# Patient Record
Sex: Female | Born: 1935 | Race: Black or African American | Hispanic: No | Marital: Married | Smoking: Never smoker
Health system: Southern US, Community
[De-identification: ages and names within clinical notes are randomized; demographics above are authoritative.]

## PROBLEM LIST (undated history)

## (undated) DIAGNOSIS — K219 Gastro-esophageal reflux disease without esophagitis: Secondary | ICD-10-CM

## (undated) DIAGNOSIS — T4145XA Adverse effect of unspecified anesthetic, initial encounter: Secondary | ICD-10-CM

## (undated) DIAGNOSIS — E039 Hypothyroidism, unspecified: Secondary | ICD-10-CM

## (undated) DIAGNOSIS — I10 Essential (primary) hypertension: Secondary | ICD-10-CM

## (undated) DIAGNOSIS — I509 Heart failure, unspecified: Secondary | ICD-10-CM

## (undated) DIAGNOSIS — R55 Syncope and collapse: Secondary | ICD-10-CM

## (undated) DIAGNOSIS — Z95810 Presence of automatic (implantable) cardiac defibrillator: Secondary | ICD-10-CM

## (undated) DIAGNOSIS — C801 Malignant (primary) neoplasm, unspecified: Secondary | ICD-10-CM

## (undated) DIAGNOSIS — IMO0001 Reserved for inherently not codable concepts without codable children: Secondary | ICD-10-CM

## (undated) DIAGNOSIS — H269 Unspecified cataract: Secondary | ICD-10-CM

## (undated) DIAGNOSIS — E785 Hyperlipidemia, unspecified: Secondary | ICD-10-CM

## (undated) DIAGNOSIS — R51 Headache: Secondary | ICD-10-CM

## (undated) DIAGNOSIS — R42 Dizziness and giddiness: Secondary | ICD-10-CM

## (undated) DIAGNOSIS — I447 Left bundle-branch block, unspecified: Secondary | ICD-10-CM

## (undated) DIAGNOSIS — R413 Other amnesia: Secondary | ICD-10-CM

## (undated) DIAGNOSIS — M1990 Unspecified osteoarthritis, unspecified site: Secondary | ICD-10-CM

## (undated) DIAGNOSIS — H9190 Unspecified hearing loss, unspecified ear: Secondary | ICD-10-CM

## (undated) DIAGNOSIS — F329 Major depressive disorder, single episode, unspecified: Secondary | ICD-10-CM

## (undated) DIAGNOSIS — I5020 Unspecified systolic (congestive) heart failure: Secondary | ICD-10-CM

## (undated) DIAGNOSIS — Z0389 Encounter for observation for other suspected diseases and conditions ruled out: Secondary | ICD-10-CM

## (undated) DIAGNOSIS — Z950 Presence of cardiac pacemaker: Secondary | ICD-10-CM

## (undated) DIAGNOSIS — T8859XA Other complications of anesthesia, initial encounter: Secondary | ICD-10-CM

## (undated) DIAGNOSIS — F32A Depression, unspecified: Secondary | ICD-10-CM

## (undated) DIAGNOSIS — I428 Other cardiomyopathies: Secondary | ICD-10-CM

## (undated) DIAGNOSIS — Z973 Presence of spectacles and contact lenses: Secondary | ICD-10-CM

## (undated) HISTORY — DX: Unspecified osteoarthritis, unspecified site: M19.90

## (undated) HISTORY — DX: Gastro-esophageal reflux disease without esophagitis: K21.9

## (undated) HISTORY — DX: Presence of automatic (implantable) cardiac defibrillator: Z95.810

## (undated) HISTORY — DX: Dizziness and giddiness: R42

## (undated) HISTORY — DX: Reserved for inherently not codable concepts without codable children: IMO0001

## (undated) HISTORY — PX: BACK SURGERY: SHX140

## (undated) HISTORY — PX: CATARACT EXTRACTION: SUR2

## (undated) HISTORY — DX: Other cardiomyopathies: I42.8

## (undated) HISTORY — DX: Heart failure, unspecified: I50.9

## (undated) HISTORY — DX: Depression, unspecified: F32.A

## (undated) HISTORY — DX: Encounter for observation for other suspected diseases and conditions ruled out: Z03.89

## (undated) HISTORY — PX: COLONOSCOPY: SHX174

## (undated) HISTORY — DX: Unspecified hearing loss, unspecified ear: H91.90

## (undated) HISTORY — DX: Unspecified systolic (congestive) heart failure: I50.20

## (undated) HISTORY — PX: EYE SURGERY: SHX253

## (undated) HISTORY — DX: Syncope and collapse: R55

## (undated) HISTORY — DX: Essential (primary) hypertension: I10

## (undated) HISTORY — DX: Hyperlipidemia, unspecified: E78.5

## (undated) HISTORY — PX: CARDIAC CATHETERIZATION: SHX172

## (undated) HISTORY — DX: Presence of spectacles and contact lenses: Z97.3

## (undated) HISTORY — DX: Headache: R51

## (undated) HISTORY — DX: Major depressive disorder, single episode, unspecified: F32.9

## (undated) HISTORY — DX: Presence of cardiac pacemaker: Z95.0

## (undated) HISTORY — DX: Malignant (primary) neoplasm, unspecified: C80.1

## (undated) HISTORY — DX: Left bundle-branch block, unspecified: I44.7

## (undated) HISTORY — DX: Other amnesia: R41.3

---
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
## 1998-01-15 ENCOUNTER — Ambulatory Visit (HOSPITAL_COMMUNITY): Admission: RE | Admit: 1998-01-15 | Discharge: 1998-01-15 | Payer: Self-pay | Admitting: Neurology

## 1998-04-09 HISTORY — PX: BREAST SURGERY: SHX581

## 1999-04-18 ENCOUNTER — Encounter (HOSPITAL_BASED_OUTPATIENT_CLINIC_OR_DEPARTMENT_OTHER): Payer: Self-pay | Admitting: General Surgery

## 1999-04-20 ENCOUNTER — Encounter (INDEPENDENT_AMBULATORY_CARE_PROVIDER_SITE_OTHER): Payer: Self-pay

## 1999-04-20 ENCOUNTER — Ambulatory Visit (HOSPITAL_COMMUNITY): Admission: RE | Admit: 1999-04-20 | Discharge: 1999-04-20 | Payer: Self-pay | Admitting: General Surgery

## 1999-06-02 ENCOUNTER — Encounter: Admission: RE | Admit: 1999-06-02 | Discharge: 1999-06-02 | Payer: Self-pay | Admitting: *Deleted

## 1999-07-05 ENCOUNTER — Other Ambulatory Visit: Admission: RE | Admit: 1999-07-05 | Discharge: 1999-07-05 | Payer: Self-pay | Admitting: *Deleted

## 1999-08-29 ENCOUNTER — Ambulatory Visit (HOSPITAL_COMMUNITY): Admission: RE | Admit: 1999-08-29 | Discharge: 1999-08-29 | Payer: Self-pay | Admitting: Internal Medicine

## 2000-01-13 ENCOUNTER — Emergency Department (HOSPITAL_COMMUNITY): Admission: EM | Admit: 2000-01-13 | Discharge: 2000-01-13 | Payer: Self-pay | Admitting: Emergency Medicine

## 2000-01-13 ENCOUNTER — Encounter: Payer: Self-pay | Admitting: Emergency Medicine

## 2000-01-19 ENCOUNTER — Other Ambulatory Visit: Admission: RE | Admit: 2000-01-19 | Discharge: 2000-01-19 | Payer: Self-pay | Admitting: Obstetrics and Gynecology

## 2000-01-19 ENCOUNTER — Encounter (INDEPENDENT_AMBULATORY_CARE_PROVIDER_SITE_OTHER): Payer: Self-pay

## 2000-06-25 ENCOUNTER — Other Ambulatory Visit: Admission: RE | Admit: 2000-06-25 | Discharge: 2000-06-25 | Payer: Self-pay | Admitting: *Deleted

## 2000-09-12 ENCOUNTER — Ambulatory Visit (HOSPITAL_COMMUNITY): Admission: RE | Admit: 2000-09-12 | Discharge: 2000-09-12 | Payer: Self-pay | Admitting: *Deleted

## 2001-05-17 HISTORY — PX: TOTAL KNEE ARTHROPLASTY: SHX125

## 2001-06-14 HISTORY — PX: JOINT REPLACEMENT: SHX530

## 2002-06-11 ENCOUNTER — Encounter: Payer: Self-pay | Admitting: Orthopedic Surgery

## 2002-06-15 ENCOUNTER — Inpatient Hospital Stay (HOSPITAL_COMMUNITY): Admission: RE | Admit: 2002-06-15 | Discharge: 2002-06-19 | Payer: Self-pay | Admitting: Orthopedic Surgery

## 2003-05-20 ENCOUNTER — Encounter: Admission: RE | Admit: 2003-05-20 | Discharge: 2003-05-20 | Payer: Self-pay | Admitting: Gastroenterology

## 2004-04-09 HISTORY — PX: LUMBAR FUSION: SHX111

## 2004-08-10 ENCOUNTER — Encounter: Admission: RE | Admit: 2004-08-10 | Discharge: 2004-08-10 | Payer: Self-pay | Admitting: Orthopedic Surgery

## 2005-03-27 ENCOUNTER — Inpatient Hospital Stay (HOSPITAL_COMMUNITY): Admission: RE | Admit: 2005-03-27 | Discharge: 2005-04-06 | Payer: Self-pay | Admitting: Neurosurgery

## 2005-03-27 ENCOUNTER — Ambulatory Visit: Payer: Self-pay | Admitting: Cardiology

## 2005-03-27 ENCOUNTER — Ambulatory Visit: Payer: Self-pay | Admitting: Physical Medicine & Rehabilitation

## 2005-03-29 ENCOUNTER — Ambulatory Visit: Payer: Self-pay | Admitting: Pulmonary Disease

## 2005-03-29 ENCOUNTER — Encounter (INDEPENDENT_AMBULATORY_CARE_PROVIDER_SITE_OTHER): Payer: Self-pay | Admitting: *Deleted

## 2005-04-06 ENCOUNTER — Inpatient Hospital Stay (HOSPITAL_COMMUNITY)
Admission: RE | Admit: 2005-04-06 | Discharge: 2005-04-17 | Payer: Self-pay | Admitting: Physical Medicine & Rehabilitation

## 2005-08-15 ENCOUNTER — Inpatient Hospital Stay (HOSPITAL_COMMUNITY): Admission: EM | Admit: 2005-08-15 | Discharge: 2005-08-21 | Payer: Self-pay | Admitting: *Deleted

## 2006-04-09 HISTORY — PX: BREAST LUMPECTOMY: SHX2

## 2006-04-09 HISTORY — PX: MASTECTOMY, PARTIAL: SHX709

## 2006-04-11 ENCOUNTER — Encounter: Payer: Self-pay | Admitting: Internal Medicine

## 2006-04-21 ENCOUNTER — Inpatient Hospital Stay (HOSPITAL_COMMUNITY): Admission: EM | Admit: 2006-04-21 | Discharge: 2006-04-26 | Payer: Self-pay | Admitting: Emergency Medicine

## 2006-04-21 DIAGNOSIS — R55 Syncope and collapse: Secondary | ICD-10-CM

## 2006-04-21 HISTORY — DX: Syncope and collapse: R55

## 2006-04-22 ENCOUNTER — Encounter: Payer: Self-pay | Admitting: Vascular Surgery

## 2006-04-23 HISTORY — PX: PACEMAKER INSERTION: SHX728

## 2006-11-01 ENCOUNTER — Emergency Department (HOSPITAL_COMMUNITY): Admission: EM | Admit: 2006-11-01 | Discharge: 2006-11-01 | Payer: Self-pay | Admitting: Family Medicine

## 2007-02-11 ENCOUNTER — Ambulatory Visit (HOSPITAL_COMMUNITY): Admission: RE | Admit: 2007-02-11 | Discharge: 2007-02-11 | Payer: Self-pay | Admitting: General Surgery

## 2007-02-11 ENCOUNTER — Encounter (HOSPITAL_BASED_OUTPATIENT_CLINIC_OR_DEPARTMENT_OTHER): Payer: Self-pay | Admitting: General Surgery

## 2007-02-21 ENCOUNTER — Ambulatory Visit: Payer: Self-pay | Admitting: Oncology

## 2007-03-05 LAB — COMPREHENSIVE METABOLIC PANEL
ALT: 24 U/L (ref 0–35)
AST: 24 U/L (ref 0–37)
Albumin: 4.2 g/dL (ref 3.5–5.2)
Alkaline Phosphatase: 55 U/L (ref 39–117)
BUN: 16 mg/dL (ref 6–23)
CO2: 23 mEq/L (ref 19–32)
Calcium: 10.1 mg/dL (ref 8.4–10.5)
Chloride: 103 mEq/L (ref 96–112)
Creatinine, Ser: 0.89 mg/dL (ref 0.40–1.20)
Glucose, Bld: 99 mg/dL (ref 70–99)
Potassium: 3.9 mEq/L (ref 3.5–5.3)
Sodium: 138 mEq/L (ref 135–145)
Total Bilirubin: 0.3 mg/dL (ref 0.3–1.2)
Total Protein: 6.9 g/dL (ref 6.0–8.3)

## 2007-03-05 LAB — CBC WITH DIFFERENTIAL/PLATELET
BASO%: 0.5 % (ref 0.0–2.0)
Basophils Absolute: 0 10*3/uL (ref 0.0–0.1)
EOS%: 4.7 % (ref 0.0–7.0)
Eosinophils Absolute: 0.4 10*3/uL (ref 0.0–0.5)
HCT: 37 % (ref 34.8–46.6)
HGB: 12.6 g/dL (ref 11.6–15.9)
LYMPH%: 29 % (ref 14.0–48.0)
MCH: 30.2 pg (ref 26.0–34.0)
MCHC: 34.1 g/dL (ref 32.0–36.0)
MCV: 88.5 fL (ref 81.0–101.0)
MONO#: 0.7 10*3/uL (ref 0.1–0.9)
MONO%: 8 % (ref 0.0–13.0)
NEUT#: 5 10*3/uL (ref 1.5–6.5)
NEUT%: 57.8 % (ref 39.6–76.8)
Platelets: 226 10*3/uL (ref 145–400)
RBC: 4.18 10*6/uL (ref 3.70–5.32)
RDW: 14 % (ref 11.3–14.5)
WBC: 8.6 10*3/uL (ref 3.9–10.0)
lymph#: 2.5 10*3/uL (ref 0.9–3.3)

## 2007-03-05 LAB — LACTATE DEHYDROGENASE: LDH: 214 U/L (ref 94–250)

## 2007-03-05 LAB — CANCER ANTIGEN 27.29: CA 27.29: 14 U/mL (ref 0–39)

## 2007-03-12 ENCOUNTER — Ambulatory Visit: Admission: RE | Admit: 2007-03-12 | Discharge: 2007-04-09 | Payer: Self-pay | Admitting: Radiation Oncology

## 2007-03-12 LAB — VITAMIN D PNL(25-HYDRXY+1,25-DIHY)-BLD
Vit D, 1,25-Dihydroxy: 63 pg/mL — ABNORMAL HIGH (ref 6–62)
Vit D, 25-Hydroxy: 37 ng/mL (ref 30–89)

## 2007-03-18 ENCOUNTER — Ambulatory Visit (HOSPITAL_COMMUNITY): Admission: RE | Admit: 2007-03-18 | Discharge: 2007-03-18 | Payer: Self-pay | Admitting: Oncology

## 2007-04-10 ENCOUNTER — Ambulatory Visit: Admission: RE | Admit: 2007-04-10 | Discharge: 2007-05-27 | Payer: Self-pay | Admitting: Radiation Oncology

## 2007-06-03 ENCOUNTER — Ambulatory Visit: Payer: Self-pay | Admitting: Oncology

## 2007-06-05 LAB — CBC WITH DIFFERENTIAL/PLATELET
BASO%: 0.8 % (ref 0.0–2.0)
Basophils Absolute: 0.1 10*3/uL (ref 0.0–0.1)
EOS%: 2.7 % (ref 0.0–7.0)
Eosinophils Absolute: 0.2 10*3/uL (ref 0.0–0.5)
HCT: 38.8 % (ref 34.8–46.6)
HGB: 13.3 g/dL (ref 11.6–15.9)
LYMPH%: 23.3 % (ref 14.0–48.0)
MCH: 29.9 pg (ref 26.0–34.0)
MCHC: 34.1 g/dL (ref 32.0–36.0)
MCV: 87.8 fL (ref 81.0–101.0)
MONO#: 0.9 10*3/uL (ref 0.1–0.9)
MONO%: 12.9 % (ref 0.0–13.0)
NEUT#: 4.2 10*3/uL (ref 1.5–6.5)
NEUT%: 60.3 % (ref 39.6–76.8)
Platelets: 208 10*3/uL (ref 145–400)
RBC: 4.43 10*6/uL (ref 3.70–5.32)
RDW: 14.4 % (ref 11.3–14.5)
WBC: 7 10*3/uL (ref 3.9–10.0)
lymph#: 1.6 10*3/uL (ref 0.9–3.3)

## 2007-06-05 LAB — COMPREHENSIVE METABOLIC PANEL
ALT: 24 U/L (ref 0–35)
AST: 26 U/L (ref 0–37)
Albumin: 4.3 g/dL (ref 3.5–5.2)
Alkaline Phosphatase: 69 U/L (ref 39–117)
BUN: 17 mg/dL (ref 6–23)
CO2: 25 mEq/L (ref 19–32)
Calcium: 9.9 mg/dL (ref 8.4–10.5)
Chloride: 103 mEq/L (ref 96–112)
Creatinine, Ser: 0.94 mg/dL (ref 0.40–1.20)
Glucose, Bld: 90 mg/dL (ref 70–99)
Potassium: 4.2 mEq/L (ref 3.5–5.3)
Sodium: 140 mEq/L (ref 135–145)
Total Bilirubin: 0.4 mg/dL (ref 0.3–1.2)
Total Protein: 7.2 g/dL (ref 6.0–8.3)

## 2008-01-22 ENCOUNTER — Ambulatory Visit: Payer: Self-pay | Admitting: Oncology

## 2008-01-28 LAB — CBC WITH DIFFERENTIAL/PLATELET
BASO%: 0.5 % (ref 0.0–2.0)
Basophils Absolute: 0 10*3/uL (ref 0.0–0.1)
EOS%: 4.4 % (ref 0.0–7.0)
Eosinophils Absolute: 0.3 10*3/uL (ref 0.0–0.5)
HCT: 38 % (ref 34.8–46.6)
HGB: 12.9 g/dL (ref 11.6–15.9)
LYMPH%: 31.5 % (ref 14.0–48.0)
MCH: 29.7 pg (ref 26.0–34.0)
MCHC: 33.8 g/dL (ref 32.0–36.0)
MCV: 87.7 fL (ref 81.0–101.0)
MONO#: 0.8 10*3/uL (ref 0.1–0.9)
MONO%: 10.1 % (ref 0.0–13.0)
NEUT#: 4.1 10*3/uL (ref 1.5–6.5)
NEUT%: 53.5 % (ref 39.6–76.8)
Platelets: 205 10*3/uL (ref 145–400)
RBC: 4.33 10*6/uL (ref 3.70–5.32)
RDW: 13.9 % (ref 11.3–14.5)
WBC: 7.7 10*3/uL (ref 3.9–10.0)
lymph#: 2.4 10*3/uL (ref 0.9–3.3)

## 2008-01-29 LAB — COMPREHENSIVE METABOLIC PANEL
ALT: 23 U/L (ref 0–35)
AST: 25 U/L (ref 0–37)
Albumin: 4.4 g/dL (ref 3.5–5.2)
Alkaline Phosphatase: 72 U/L (ref 39–117)
BUN: 18 mg/dL (ref 6–23)
CO2: 26 mEq/L (ref 19–32)
Calcium: 10.3 mg/dL (ref 8.4–10.5)
Chloride: 99 mEq/L (ref 96–112)
Creatinine, Ser: 0.9 mg/dL (ref 0.40–1.20)
Glucose, Bld: 96 mg/dL (ref 70–99)
Potassium: 4 mEq/L (ref 3.5–5.3)
Sodium: 135 mEq/L (ref 135–145)
Total Bilirubin: 0.3 mg/dL (ref 0.3–1.2)
Total Protein: 7.5 g/dL (ref 6.0–8.3)

## 2008-01-29 LAB — CANCER ANTIGEN 27.29: CA 27.29: 25 U/mL (ref 0–39)

## 2008-01-29 LAB — LACTATE DEHYDROGENASE: LDH: 198 U/L (ref 94–250)

## 2008-01-29 LAB — VITAMIN D 25 HYDROXY (VIT D DEFICIENCY, FRACTURES): Vit D, 25-Hydroxy: 37 ng/mL (ref 30–89)

## 2008-07-28 ENCOUNTER — Ambulatory Visit: Payer: Self-pay | Admitting: Oncology

## 2008-08-03 LAB — CBC WITH DIFFERENTIAL/PLATELET
BASO%: 0.4 % (ref 0.0–2.0)
Basophils Absolute: 0 10*3/uL (ref 0.0–0.1)
EOS%: 5.1 % (ref 0.0–7.0)
Eosinophils Absolute: 0.4 10*3/uL (ref 0.0–0.5)
HCT: 38.2 % (ref 34.8–46.6)
HGB: 12.7 g/dL (ref 11.6–15.9)
LYMPH%: 26.9 % (ref 14.0–49.7)
MCH: 29.4 pg (ref 25.1–34.0)
MCHC: 33.2 g/dL (ref 31.5–36.0)
MCV: 88.5 fL (ref 79.5–101.0)
MONO#: 0.7 10*3/uL (ref 0.1–0.9)
MONO%: 10 % (ref 0.0–14.0)
NEUT#: 4 10*3/uL (ref 1.5–6.5)
NEUT%: 57.6 % (ref 38.4–76.8)
Platelets: 195 10*3/uL (ref 145–400)
RBC: 4.32 10*6/uL (ref 3.70–5.45)
RDW: 14.4 % (ref 11.2–14.5)
WBC: 7 10*3/uL (ref 3.9–10.3)
lymph#: 1.9 10*3/uL (ref 0.9–3.3)

## 2008-08-04 LAB — COMPREHENSIVE METABOLIC PANEL
ALT: 28 U/L (ref 0–35)
AST: 27 U/L (ref 0–37)
Albumin: 4.3 g/dL (ref 3.5–5.2)
Alkaline Phosphatase: 63 U/L (ref 39–117)
BUN: 15 mg/dL (ref 6–23)
CO2: 26 mEq/L (ref 19–32)
Calcium: 10 mg/dL (ref 8.4–10.5)
Chloride: 103 mEq/L (ref 96–112)
Creatinine, Ser: 0.85 mg/dL (ref 0.40–1.20)
Glucose, Bld: 99 mg/dL (ref 70–99)
Potassium: 4.1 mEq/L (ref 3.5–5.3)
Sodium: 138 mEq/L (ref 135–145)
Total Bilirubin: 0.3 mg/dL (ref 0.3–1.2)
Total Protein: 7.4 g/dL (ref 6.0–8.3)

## 2008-08-04 LAB — CANCER ANTIGEN 27.29: CA 27.29: 24 U/mL (ref 0–39)

## 2008-08-04 LAB — LACTATE DEHYDROGENASE: LDH: 194 U/L (ref 94–250)

## 2008-08-04 LAB — VITAMIN D 25 HYDROXY (VIT D DEFICIENCY, FRACTURES): Vit D, 25-Hydroxy: 44 ng/mL (ref 30–89)

## 2009-02-08 ENCOUNTER — Ambulatory Visit: Payer: Self-pay | Admitting: Oncology

## 2009-02-10 LAB — CBC WITH DIFFERENTIAL/PLATELET
BASO%: 0.5 % (ref 0.0–2.0)
Basophils Absolute: 0 10*3/uL (ref 0.0–0.1)
EOS%: 3.5 % (ref 0.0–7.0)
Eosinophils Absolute: 0.3 10*3/uL (ref 0.0–0.5)
HCT: 37.8 % (ref 34.8–46.6)
HGB: 12.4 g/dL (ref 11.6–15.9)
LYMPH%: 34.9 % (ref 14.0–49.7)
MCH: 28.9 pg (ref 25.1–34.0)
MCHC: 32.8 g/dL (ref 31.5–36.0)
MCV: 88.1 fL (ref 79.5–101.0)
MONO#: 0.7 10*3/uL (ref 0.1–0.9)
MONO%: 8.5 % (ref 0.0–14.0)
NEUT#: 4.1 10*3/uL (ref 1.5–6.5)
NEUT%: 52.6 % (ref 38.4–76.8)
Platelets: 152 10*3/uL (ref 145–400)
RBC: 4.29 10*6/uL (ref 3.70–5.45)
RDW: 14.9 % — ABNORMAL HIGH (ref 11.2–14.5)
WBC: 7.8 10*3/uL (ref 3.9–10.3)
lymph#: 2.7 10*3/uL (ref 0.9–3.3)
nRBC: 0 % (ref 0–0)

## 2009-02-11 LAB — COMPREHENSIVE METABOLIC PANEL
ALT: 24 U/L (ref 0–35)
AST: 24 U/L (ref 0–37)
Albumin: 4.3 g/dL (ref 3.5–5.2)
Alkaline Phosphatase: 58 U/L (ref 39–117)
BUN: 19 mg/dL (ref 6–23)
CO2: 25 mEq/L (ref 19–32)
Calcium: 9.7 mg/dL (ref 8.4–10.5)
Chloride: 103 mEq/L (ref 96–112)
Creatinine, Ser: 0.84 mg/dL (ref 0.40–1.20)
Glucose, Bld: 89 mg/dL (ref 70–99)
Potassium: 3.9 mEq/L (ref 3.5–5.3)
Sodium: 139 mEq/L (ref 135–145)
Total Bilirubin: 0.2 mg/dL — ABNORMAL LOW (ref 0.3–1.2)
Total Protein: 7 g/dL (ref 6.0–8.3)

## 2009-02-11 LAB — LACTATE DEHYDROGENASE: LDH: 202 U/L (ref 94–250)

## 2009-02-11 LAB — CANCER ANTIGEN 27.29: CA 27.29: 23 U/mL (ref 0–39)

## 2009-02-11 LAB — VITAMIN D 25 HYDROXY (VIT D DEFICIENCY, FRACTURES): Vit D, 25-Hydroxy: 27 ng/mL — ABNORMAL LOW (ref 30–89)

## 2009-03-23 ENCOUNTER — Encounter: Payer: Self-pay | Admitting: Internal Medicine

## 2009-08-09 ENCOUNTER — Ambulatory Visit: Payer: Self-pay | Admitting: Oncology

## 2009-08-10 LAB — CBC WITH DIFFERENTIAL/PLATELET
BASO%: 0.2 % (ref 0.0–2.0)
Basophils Absolute: 0 10*3/uL (ref 0.0–0.1)
EOS%: 2.7 % (ref 0.0–7.0)
Eosinophils Absolute: 0.2 10*3/uL (ref 0.0–0.5)
HCT: 38.4 % (ref 34.8–46.6)
HGB: 12.7 g/dL (ref 11.6–15.9)
LYMPH%: 32.1 % (ref 14.0–49.7)
MCH: 28.7 pg (ref 25.1–34.0)
MCHC: 33.1 g/dL (ref 31.5–36.0)
MCV: 86.7 fL (ref 79.5–101.0)
MONO#: 0.6 10*3/uL (ref 0.1–0.9)
MONO%: 7 % (ref 0.0–14.0)
NEUT#: 5.2 10*3/uL (ref 1.5–6.5)
NEUT%: 58 % (ref 38.4–76.8)
Platelets: 151 10*3/uL (ref 145–400)
RBC: 4.43 10*6/uL (ref 3.70–5.45)
RDW: 15.7 % — ABNORMAL HIGH (ref 11.2–14.5)
WBC: 9 10*3/uL (ref 3.9–10.3)
lymph#: 2.9 10*3/uL (ref 0.9–3.3)
nRBC: 0 % (ref 0–0)

## 2009-08-11 LAB — COMPREHENSIVE METABOLIC PANEL
ALT: 36 U/L — ABNORMAL HIGH (ref 0–35)
AST: 28 U/L (ref 0–37)
Albumin: 4.4 g/dL (ref 3.5–5.2)
Alkaline Phosphatase: 53 U/L (ref 39–117)
BUN: 27 mg/dL — ABNORMAL HIGH (ref 6–23)
CO2: 23 mEq/L (ref 19–32)
Calcium: 9.8 mg/dL (ref 8.4–10.5)
Chloride: 105 mEq/L (ref 96–112)
Creatinine, Ser: 0.93 mg/dL (ref 0.40–1.20)
Glucose, Bld: 125 mg/dL — ABNORMAL HIGH (ref 70–99)
Potassium: 3.8 mEq/L (ref 3.5–5.3)
Sodium: 140 mEq/L (ref 135–145)
Total Bilirubin: 0.2 mg/dL — ABNORMAL LOW (ref 0.3–1.2)
Total Protein: 7.1 g/dL (ref 6.0–8.3)

## 2009-08-11 LAB — CANCER ANTIGEN 27.29: CA 27.29: 26 U/mL (ref 0–39)

## 2009-08-11 LAB — LACTATE DEHYDROGENASE: LDH: 214 U/L (ref 94–250)

## 2009-08-11 LAB — VITAMIN D 25 HYDROXY (VIT D DEFICIENCY, FRACTURES): Vit D, 25-Hydroxy: 41 ng/mL (ref 30–89)

## 2009-11-04 ENCOUNTER — Encounter: Admission: RE | Admit: 2009-11-04 | Discharge: 2009-11-04 | Payer: Self-pay | Admitting: Orthopedic Surgery

## 2010-01-01 ENCOUNTER — Inpatient Hospital Stay (HOSPITAL_COMMUNITY): Admission: EM | Admit: 2010-01-01 | Discharge: 2010-01-03 | Payer: Self-pay | Admitting: Emergency Medicine

## 2010-01-01 ENCOUNTER — Ambulatory Visit: Payer: Self-pay | Admitting: Internal Medicine

## 2010-01-03 ENCOUNTER — Ambulatory Visit: Payer: Self-pay | Admitting: Vascular Surgery

## 2010-01-03 ENCOUNTER — Encounter (INDEPENDENT_AMBULATORY_CARE_PROVIDER_SITE_OTHER): Payer: Self-pay | Admitting: Internal Medicine

## 2010-01-12 ENCOUNTER — Ambulatory Visit (HOSPITAL_COMMUNITY)
Admission: RE | Admit: 2010-01-12 | Discharge: 2010-01-12 | Payer: Self-pay | Source: Home / Self Care | Admitting: General Surgery

## 2010-02-14 ENCOUNTER — Ambulatory Visit: Payer: Self-pay | Admitting: Oncology

## 2010-02-15 LAB — CBC WITH DIFFERENTIAL/PLATELET
BASO%: 0.4 % (ref 0.0–2.0)
Basophils Absolute: 0 10*3/uL (ref 0.0–0.1)
EOS%: 4.7 % (ref 0.0–7.0)
Eosinophils Absolute: 0.4 10*3/uL (ref 0.0–0.5)
HCT: 39.2 % (ref 34.8–46.6)
HGB: 12.8 g/dL (ref 11.6–15.9)
LYMPH%: 38.1 % (ref 14.0–49.7)
MCH: 28.5 pg (ref 25.1–34.0)
MCHC: 32.7 g/dL (ref 31.5–36.0)
MCV: 87.3 fL (ref 79.5–101.0)
MONO#: 0.7 10*3/uL (ref 0.1–0.9)
MONO%: 9.3 % (ref 0.0–14.0)
NEUT#: 3.7 10*3/uL (ref 1.5–6.5)
NEUT%: 47.5 % (ref 38.4–76.8)
Platelets: 154 10*3/uL (ref 145–400)
RBC: 4.49 10*6/uL (ref 3.70–5.45)
RDW: 15.4 % — ABNORMAL HIGH (ref 11.2–14.5)
WBC: 7.7 10*3/uL (ref 3.9–10.3)
lymph#: 2.9 10*3/uL (ref 0.9–3.3)

## 2010-02-15 LAB — COMPREHENSIVE METABOLIC PANEL
ALT: 31 U/L (ref 0–35)
AST: 30 U/L (ref 0–37)
Albumin: 4.2 g/dL (ref 3.5–5.2)
Alkaline Phosphatase: 68 U/L (ref 39–117)
BUN: 25 mg/dL — ABNORMAL HIGH (ref 6–23)
CO2: 29 mEq/L (ref 19–32)
Calcium: 10.6 mg/dL — ABNORMAL HIGH (ref 8.4–10.5)
Chloride: 101 mEq/L (ref 96–112)
Creatinine, Ser: 1.13 mg/dL (ref 0.40–1.20)
Glucose, Bld: 99 mg/dL (ref 70–99)
Potassium: 4.5 mEq/L (ref 3.5–5.3)
Sodium: 139 mEq/L (ref 135–145)
Total Bilirubin: 0.7 mg/dL (ref 0.3–1.2)
Total Protein: 7.8 g/dL (ref 6.0–8.3)

## 2010-02-15 LAB — LACTATE DEHYDROGENASE: LDH: 186 U/L (ref 94–250)

## 2010-02-16 LAB — VITAMIN D 25 HYDROXY (VIT D DEFICIENCY, FRACTURES): Vit D, 25-Hydroxy: 54 ng/mL (ref 30–89)

## 2010-02-16 LAB — CANCER ANTIGEN 27.29: CA 27.29: 26 U/mL (ref 0–39)

## 2010-06-22 LAB — CK TOTAL AND CKMB (NOT AT ARMC)
CK, MB: 2.1 ng/mL (ref 0.3–4.0)
Relative Index: INVALID (ref 0.0–2.5)
Total CK: 88 U/L (ref 7–177)

## 2010-06-22 LAB — LIPID PANEL
Cholesterol: 101 mg/dL (ref 0–200)
Cholesterol: 210 mg/dL — ABNORMAL HIGH (ref 0–200)
HDL: 32 mg/dL — ABNORMAL LOW (ref >39)
HDL: 52 mg/dL (ref >39)
LDL Cholesterol: 135 mg/dL — ABNORMAL HIGH (ref 0–99)
LDL Cholesterol: 39 mg/dL (ref 0–99)
Total CHOL/HDL Ratio: 3.2 RATIO
Total CHOL/HDL Ratio: 4 RATIO
Triglycerides: 114 mg/dL (ref <150)
Triglycerides: 148 mg/dL (ref <150)
VLDL: 23 mg/dL (ref 0–40)
VLDL: 30 mg/dL (ref 0–40)

## 2010-06-22 LAB — URINE MICROSCOPIC-ADD ON

## 2010-06-22 LAB — POCT CARDIAC MARKERS
CKMB, poc: 1.1 ng/mL (ref 1.0–8.0)
Myoglobin, poc: 96.3 ng/mL (ref 12–200)
Troponin i, poc: <0.05 ng/mL (ref 0.00–0.09)

## 2010-06-22 LAB — CBC
HCT: 40.3 % (ref 36.0–46.0)
HCT: 38 % (ref 36.0–46.0)
HCT: 41.4 % (ref 36.0–46.0)
Hemoglobin: 13.3 g/dL (ref 12.0–15.0)
Hemoglobin: 12.6 g/dL (ref 12.0–15.0)
Hemoglobin: 13.8 g/dL (ref 12.0–15.0)
MCH: 28.8 pg (ref 26.0–34.0)
MCH: 29 pg (ref 26.0–34.0)
MCH: 29.3 pg (ref 26.0–34.0)
MCHC: 33 g/dL (ref 30.0–36.0)
MCHC: 33.2 g/dL (ref 30.0–36.0)
MCHC: 33.3 g/dL (ref 30.0–36.0)
MCV: 87.2 fL (ref 78.0–100.0)
MCV: 87.4 fL (ref 78.0–100.0)
MCV: 87.9 fL (ref 78.0–100.0)
Platelets: 181 10*3/uL (ref 150–400)
Platelets: 128 10*3/uL — ABNORMAL LOW (ref 150–400)
Platelets: 128 10*3/uL — ABNORMAL LOW (ref 150–400)
RBC: 4.62 MIL/uL (ref 3.87–5.11)
RBC: 4.35 MIL/uL (ref 3.87–5.11)
RBC: 4.71 MIL/uL (ref 3.87–5.11)
RDW: 14.7 % (ref 11.5–15.5)
RDW: 14.9 % (ref 11.5–15.5)
RDW: 15 % (ref 11.5–15.5)
WBC: 9.6 10*3/uL (ref 4.0–10.5)
WBC: 7.9 10*3/uL (ref 4.0–10.5)
WBC: 7.9 10*3/uL (ref 4.0–10.5)

## 2010-06-22 LAB — CARDIAC PANEL(CRET KIN+CKTOT+MB+TROPI)
CK, MB: 2 ng/mL (ref 0.3–4.0)
CK, MB: 2.4 ng/mL (ref 0.3–4.0)
CK, MB: 3.2 ng/mL (ref 0.3–4.0)
Relative Index: INVALID (ref 0.0–2.5)
Relative Index: INVALID (ref 0.0–2.5)
Relative Index: INVALID (ref 0.0–2.5)
Total CK: 86 U/L (ref 7–177)
Total CK: 93 U/L (ref 7–177)
Total CK: 93 U/L (ref 7–177)
Troponin I: 0.02 ng/mL (ref 0.00–0.06)
Troponin I: 0.02 ng/mL (ref 0.00–0.06)
Troponin I: 0.05 ng/mL (ref 0.00–0.06)

## 2010-06-22 LAB — BASIC METABOLIC PANEL
BUN: 20 mg/dL (ref 6–23)
BUN: 15 mg/dL (ref 6–23)
CO2: 29 mEq/L (ref 19–32)
CO2: 30 mEq/L (ref 19–32)
Calcium: 10.4 mg/dL (ref 8.4–10.5)
Calcium: 10.4 mg/dL (ref 8.4–10.5)
Chloride: 103 mEq/L (ref 96–112)
Chloride: 104 mEq/L (ref 96–112)
Creatinine, Ser: 1.08 mg/dL (ref 0.4–1.2)
Creatinine, Ser: 0.81 mg/dL (ref 0.4–1.2)
GFR calc Af Amer: >60 mL/min (ref >60)
GFR calc Af Amer: >60 mL/min (ref >60)
GFR calc non Af Amer: 50 mL/min — ABNORMAL LOW (ref >60)
GFR calc non Af Amer: >60 mL/min (ref >60)
Glucose, Bld: 94 mg/dL (ref 70–99)
Glucose, Bld: 99 mg/dL (ref 70–99)
Potassium: 3.9 mEq/L (ref 3.5–5.1)
Potassium: 4.3 mEq/L (ref 3.5–5.1)
Sodium: 139 mEq/L (ref 135–145)
Sodium: 140 mEq/L (ref 135–145)

## 2010-06-22 LAB — POCT I-STAT, CHEM 8
BUN: 24 mg/dL — ABNORMAL HIGH (ref 6–23)
Calcium, Ion: 1.2 mmol/L (ref 1.12–1.32)
Chloride: 107 mEq/L (ref 96–112)
Creatinine, Ser: 1 mg/dL (ref 0.4–1.2)
Glucose, Bld: 89 mg/dL (ref 70–99)
HCT: 40 % (ref 36.0–46.0)
Hemoglobin: 13.6 g/dL (ref 12.0–15.0)
Potassium: 4.1 mEq/L (ref 3.5–5.1)
Sodium: 141 mEq/L (ref 135–145)
TCO2: 26 mmol/L (ref 0–100)

## 2010-06-22 LAB — TSH: TSH: 3.488 u[IU]/mL (ref 0.350–4.500)

## 2010-06-22 LAB — TROPONIN I: Troponin I: 0.02 ng/mL (ref 0.00–0.06)

## 2010-06-22 LAB — URINALYSIS, ROUTINE W REFLEX MICROSCOPIC
Bilirubin Urine: NEGATIVE
Glucose, UA: NEGATIVE mg/dL
Hgb urine dipstick: NEGATIVE
Ketones, ur: NEGATIVE mg/dL
Nitrite: NEGATIVE
Protein, ur: NEGATIVE mg/dL
Specific Gravity, Urine: 1.02 (ref 1.005–1.030)
Urobilinogen, UA: 0.2 mg/dL (ref 0.0–1.0)
pH: 7.5 (ref 5.0–8.0)

## 2010-06-22 LAB — DIFFERENTIAL
Basophils Absolute: 0 10*3/uL (ref 0.0–0.1)
Basophils Relative: 0 % (ref 0–1)
Eosinophils Absolute: 0.2 10*3/uL (ref 0.0–0.7)
Eosinophils Relative: 2 % (ref 0–5)
Lymphocytes Relative: 29 % (ref 12–46)
Lymphs Abs: 2.3 10*3/uL (ref 0.7–4.0)
Monocytes Absolute: 0.8 10*3/uL (ref 0.1–1.0)
Monocytes Relative: 10 % (ref 3–12)
Neutro Abs: 4.6 10*3/uL (ref 1.7–7.7)
Neutrophils Relative %: 59 % (ref 43–77)

## 2010-06-22 LAB — SURGICAL PCR SCREEN
MRSA, PCR: NEGATIVE
Staphylococcus aureus: POSITIVE — AB

## 2010-06-22 LAB — BRAIN NATRIURETIC PEPTIDE: Pro B Natriuretic peptide (BNP): 148 pg/mL — ABNORMAL HIGH (ref 0.0–100.0)

## 2010-08-01 ENCOUNTER — Encounter: Payer: Self-pay | Admitting: Internal Medicine

## 2010-08-02 ENCOUNTER — Encounter: Payer: Self-pay | Admitting: Internal Medicine

## 2010-08-02 ENCOUNTER — Ambulatory Visit (INDEPENDENT_AMBULATORY_CARE_PROVIDER_SITE_OTHER): Payer: Medicare Other | Admitting: Internal Medicine

## 2010-08-02 VITALS — BP 130/76 | HR 68 | Ht 60.0 in | Wt 151.8 lb

## 2010-08-02 DIAGNOSIS — I428 Other cardiomyopathies: Secondary | ICD-10-CM

## 2010-08-02 DIAGNOSIS — I5022 Chronic systolic (congestive) heart failure: Secondary | ICD-10-CM | POA: Insufficient documentation

## 2010-08-02 DIAGNOSIS — Z95810 Presence of automatic (implantable) cardiac defibrillator: Secondary | ICD-10-CM | POA: Insufficient documentation

## 2010-08-02 DIAGNOSIS — I10 Essential (primary) hypertension: Secondary | ICD-10-CM | POA: Insufficient documentation

## 2010-08-02 NOTE — Assessment & Plan Note (Signed)
Her device is working normally. We'll recheck in several months. 

## 2010-08-02 NOTE — Assessment & Plan Note (Signed)
Her blood pressure is fairly well controlled. She will continue her current medications and maintain a low-sodium diet. 

## 2010-08-02 NOTE — Progress Notes (Signed)
HPI [name] is referred today by Dr. Katrinka Blazing for ongoing defibrillator evaluation and treatment. The patient is a very pleasant 77 year old woman with a history of a dilated cardiomyopathy, hypertension, and congestive heart failure. She is status post biventricular ICD insertion. She has left bundle branch block. She was hospitalized several months ago with syncope. No c/p, sob, or peripheral edema. Allergies  Allergen Reactions  . Aspirin   . Codeine     vomit     Current Outpatient Prescriptions  Medication Sig Dispense Refill  . acetaminophen (TYLENOL) 325 MG tablet Take 650 mg by mouth 2 (two) times daily.        . BuPROPion HCl (WELLBUTRIN PO) Take 450 mg by mouth.       . Cholecalciferol (VITAMIN D) 2000 UNITS CAPS Take by mouth.        . furosemide (LASIX) 40 MG tablet Take by mouth. 40 mg in the morning and 20 mg at night       . levothyroxine (SYNTHROID, LEVOTHROID) 25 MCG tablet Take 25 mcg by mouth daily.        . metoprolol (LOPRESSOR) 50 MG tablet Take 50 mg by mouth 2 (two) times daily.        Marland Kitchen olmesartan (BENICAR) 40 MG tablet Take 40 mg by mouth daily.        . OXcarbazepine (TRILEPTAL) 150 MG tablet Take 150 mg by mouth daily.        . potassium chloride SA (K-DUR,KLOR-CON) 20 MEQ tablet Take 20 mEq by mouth daily.        . sertraline (ZOLOFT) 100 MG tablet Take 100 mg by mouth 2 (two) times daily.       . vitamin E 800 UNIT capsule Take 800 Units by mouth daily.           Past Medical History  Diagnosis Date  . Syncope   . Ischemic cardiomyopathy   . Systolic CHF   . Depression   . ICD (implantable cardiac defibrillator), biventricular, in situ   . LBBB (left bundle branch block)   . Dyslipidemia     ROS:   All systems reviewed and negative except as noted in the HPI.   No past surgical history on file.   No family history on file.   History   Social History  . Marital Status: Married    Spouse Name: N/A    Number of Children: N/A  . Years of  Education: N/A   Occupational History  . Not on file.   Social History Main Topics  . Smoking status: Never Smoker   . Smokeless tobacco: Not on file  . Alcohol Use: No  . Drug Use: No  . Sexually Active: Not on file   Other Topics Concern  . Not on file   Social History Narrative  . No narrative on file     BP 130/76  Pulse 68  Ht 5' (1.524 m)  Wt 151 lb 12.8 oz (68.856 kg)  BMI 29.65 kg/m2  Physical Exam:  Well appearing NAD HEENT: Unremarkable Neck:  No JVD, no thyromegally Lymphatics:  No adenopathy Back:  No CVA tenderness Lungs:  Clear. Well-healed ICD incision. HEART:  Regular rate rhythm, no murmurs, no rubs, no clicks Abd:  Flat, positive bowel sounds, no organomegally, no rebound, no guarding Ext:  2 plus pulses, no edema, no cyanosis, no clubbing Skin:  No rashes no nodules Neuro:  CN II through XII intact, motor grossly intact  DEVICE  Normal device function.  See PaceArt for details.   Assess/Plan:

## 2010-08-02 NOTE — Patient Instructions (Signed)
Your physician wants you to follow-up in: 12 months with Dr Taylor You will receive a reminder letter in the mail two months in advance. If you don't receive a letter, please call our office to schedule the follow-up appointment.  Remote monitoring is used to monitor your Pacemaker of ICD from home. This monitoring reduces the number of office visits required to check your device to one time per year. It allows us to keep an eye on the functioning of your device to ensure it is working properly. You are scheduled for a device check from home on 11/02/10 You may send your transmission at any time that day. If you have a wireless device, the transmission will be sent automatically. After your physician reviews your transmission, you will receive a postcard with your next transmission date.    

## 2010-08-02 NOTE — Assessment & Plan Note (Signed)
Her symptoms are class II. She will continue her current medications and a low sodium diet.

## 2010-08-08 ENCOUNTER — Encounter (INDEPENDENT_AMBULATORY_CARE_PROVIDER_SITE_OTHER): Payer: Self-pay | Admitting: General Surgery

## 2010-08-08 DIAGNOSIS — R55 Syncope and collapse: Secondary | ICD-10-CM | POA: Insufficient documentation

## 2010-08-17 ENCOUNTER — Other Ambulatory Visit: Payer: Self-pay | Admitting: Oncology

## 2010-08-17 ENCOUNTER — Encounter (HOSPITAL_BASED_OUTPATIENT_CLINIC_OR_DEPARTMENT_OTHER): Payer: Medicare Other | Admitting: Oncology

## 2010-08-17 DIAGNOSIS — C50419 Malignant neoplasm of upper-outer quadrant of unspecified female breast: Secondary | ICD-10-CM

## 2010-08-17 DIAGNOSIS — Z171 Estrogen receptor negative status [ER-]: Secondary | ICD-10-CM

## 2010-08-17 LAB — COMPREHENSIVE METABOLIC PANEL
ALT: 28 U/L (ref 0–35)
AST: 25 U/L (ref 0–37)
Albumin: 3.9 g/dL (ref 3.5–5.2)
Alkaline Phosphatase: 75 U/L (ref 39–117)
BUN: 23 mg/dL (ref 6–23)
CO2: 28 mEq/L (ref 19–32)
Calcium: 10.5 mg/dL (ref 8.4–10.5)
Chloride: 103 mEq/L (ref 96–112)
Creatinine, Ser: 0.88 mg/dL (ref 0.40–1.20)
Glucose, Bld: 108 mg/dL — ABNORMAL HIGH (ref 70–99)
Potassium: 3.9 mEq/L (ref 3.5–5.3)
Sodium: 140 mEq/L (ref 135–145)
Total Bilirubin: 0.2 mg/dL — ABNORMAL LOW (ref 0.3–1.2)
Total Protein: 7.1 g/dL (ref 6.0–8.3)

## 2010-08-17 LAB — CBC WITH DIFFERENTIAL/PLATELET
BASO%: 0.3 % (ref 0.0–2.0)
Basophils Absolute: 0 10*3/uL (ref 0.0–0.1)
EOS%: 7 % (ref 0.0–7.0)
Eosinophils Absolute: 0.5 10*3/uL (ref 0.0–0.5)
HCT: 37.6 % (ref 34.8–46.6)
HGB: 12.4 g/dL (ref 11.6–15.9)
LYMPH%: 37.4 % (ref 14.0–49.7)
MCH: 28.1 pg (ref 25.1–34.0)
MCHC: 33 g/dL (ref 31.5–36.0)
MCV: 85.1 fL (ref 79.5–101.0)
MONO#: 0.7 10*3/uL (ref 0.1–0.9)
MONO%: 8.6 % (ref 0.0–14.0)
NEUT#: 3.6 10*3/uL (ref 1.5–6.5)
NEUT%: 46.7 % (ref 38.4–76.8)
Platelets: 143 10*3/uL — ABNORMAL LOW (ref 145–400)
RBC: 4.42 10*6/uL (ref 3.70–5.45)
RDW: 15.1 % — ABNORMAL HIGH (ref 11.2–14.5)
WBC: 7.6 10*3/uL (ref 3.9–10.3)
lymph#: 2.8 10*3/uL (ref 0.9–3.3)

## 2010-08-18 LAB — VITAMIN D 25 HYDROXY (VIT D DEFICIENCY, FRACTURES): Vit D, 25-Hydroxy: 55 ng/mL (ref 30–89)

## 2010-08-18 LAB — CANCER ANTIGEN 27.29: CA 27.29: 26 U/mL (ref 0–39)

## 2010-08-22 NOTE — Op Note (Signed)
NAMEFOLASHADE, GAMBOA                ACCOUNT NO.:  [id number]   MEDICAL RECORD NO.:  [id number]          PATIENT TYPE:  AMB   LOCATION:  SDS                          FACILITY:  [facility]   PHYSICIAN:  Leonie Man, M.D.   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   PROCEDURE:  Left partial mastectomy.   SURGEON:  Leonie Man, M.D.   ASSISTANT:  OR tech.   ANESTHESIA:  General.   SPECIMENS TO THE LAB:  Breast tissue, left breast.   FINDINGS:  On touch prep:  Close anteromedial margin.  Findings on  specimen mammography:  All calcifications removed.   ESTIMATED BLOOD LOSS:  Minimal.   There were no apparent surgical complications.   NOTE:  Ms. 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  On stereotactic biopsy this shows DCIS.  The  patient comes to the operating room now for partial mastectomy with wide  excision of this area after all the risks and potential benefits of  surgery have been discussed, all questions answered and consent  obtained.   The patient has an AICD monitor.  The defibrillation portion was turned  off prior to surgery and the patient was pacing well.   PROCEDURE:  Following the induction of satisfactory general anesthesia,  the left breast was prepped and draped to be included in the sterile  operative field.  An elliptical incision is carried down around the  localizing needle.  This was deepened through the skin and subcutaneous  tissues.  Superior and inferior skin flaps were raises to fascilitate a  wide margin of breast tissue around  the localizing needle, taking this  down to the chest wall.  The specimen was removed and forwarded to  specimen mammography.  On specimen mammography it appeared that all  calcifications were removed. Two orienting sutures were placed and ths  specimen forwarded for specimen mammography and touch preps for margins.  During the course of time that we were waiting for the touch prep to  return, the patient's heart rate was at about 58 without any obvious  pacer spike that I could see.  The patient was in a sinus bradycardia.  Her O2 saturations were good and blood pressure was excellent.  However,  because of potential malfunction of the pacemaker mechanism or a  malfunction of the monitoring device, I decided at this point to abort  the procedure.  The wound was thoroughly irrigated with normal saline.  Sponge and instrument counts were verified.  The subcutaneous tissues  were closed with interrupted 3-0 Vicryl sutures and the skin was closed  with a 5-0 Monocryl suture and then reinforced with Dermabond and Steri-  Strips  and a sterile dressing was applied.  At the time that the test  report was returned, Dr. Berneta Levins informed me that the anteromedial  margin appeared to be close, although there were no tumor cells noted on  touch prep.   The patient was subsequently removed from the operating room to the  recovery room and she was in stable condition.  She tolerated the  procedure well.      Leonie Man, M.D.  Electronically Signed     PB/MEDQ  D:  02/11/2007  T:  02/12/2007  Job:  [id number]

## 2010-08-24 ENCOUNTER — Encounter (HOSPITAL_BASED_OUTPATIENT_CLINIC_OR_DEPARTMENT_OTHER): Payer: Medicare Other | Admitting: Oncology

## 2010-08-24 DIAGNOSIS — C50419 Malignant neoplasm of upper-outer quadrant of unspecified female breast: Secondary | ICD-10-CM

## 2010-08-24 DIAGNOSIS — Z171 Estrogen receptor negative status [ER-]: Secondary | ICD-10-CM

## 2010-08-24 DIAGNOSIS — I10 Essential (primary) hypertension: Secondary | ICD-10-CM

## 2010-08-25 NOTE — Consult Note (Signed)
Stacey, Hurley                ACCOUNT NO.:  [id number]   MEDICAL RECORD NO.:  [id number]          PATIENT TYPE:  INP   LOCATION:  3731                         FACILITY:  [facility]   PHYSICIAN:  Lyn Records, M.D.   DATE OF BIRTH:  [birth year]   DATE OF CONSULTATION:  04/23/2006  DATE OF DISCHARGE:                                 CONSULTATION   REASON FOR CONSULTATION:  Syncope.   CONCLUSIONS:  1. Syncope, probably neurally mediated.  2. Non-ischemic cardiomyopathy with ejection fraction known to be      approximately 20-25%.  3. Hypertension, recently under control.  4. Left bundle branch block with QRS duration of greater than 150      milliseconds.  5. History of gastroesophageal reflux.  6. Hyperlipidemia.   RECOMMENDATIONS:  1. I do not feel the patient needs a primary electrophysiologic      evaluation or an ischemic evaluation.  2. She needs to be monitored for arrhythmia.  3. The patient needs to have biventricular pacemaker and AICD      implantation given her cardiac sub-straight.  4. Continue heart failure therapy as already prescribed.   COMMENTS:  [name] is a very pleasant 77 year old patient with non-  ischemic cardiomyopathy and documented EF less than 30%.  She has normal  coronary arteries by cardiac catheterization in 2007.  Recent  echocardiogram demonstrated persistent LVF in the 20-25% range despite  aggressive medical therapy.  She was admitted to the hospital on this  occasion after having syncope at church.  She stated that during church,  she began to have warm, somewhat nauseated, and tried to get up to go  out and get some air and when she stood, she fainted.  She was brought  to [facility] by EMS.  She has had no recurrence of symptoms since admission.  No arrhythmias.   MEDICATIONS ON ADMISSION:  Benicar 40 mg daily, Metoprolol 50 mg b.i.d.,  multi-vitamin daily, Nexium 40 mg daily, Skelaxin 800 mg q.i.d.,  Synthroid 50 mcg daily, Trileptal 300  mg daily, Wellbutrin XL 150 mg  t.i.d., Lasix 20 mg daily, Zoloft 50 mg daily, calcium, and  Spironolactone 25 mg daily.   ALLERGIES:  ASPIRIN, CODEINE, AND SHELL FISH.   FAMILY HISTORY:  Noncontributory.   SOCIAL HISTORY:  The patient is from [place].  She does not smoke or  drink.  Her husband is a professor at [employer].  She is a  retired Designer, jewellery.   PHYSICAL EXAMINATION:  GENERAL:  The patient is in no acute distress.  VITAL SIGNS:  Blood pressure 110/70, heart rate 80.  NECK:  No JVD.  LUNGS:  Clear.  CARDIAC:  Soft S3 gallop.  ABDOMEN:  Soft, no ascites.  EXTREMITIES:  No edema.   LABORATORY DATA:  Normal troponins x3.  D-dimer was 0.74.  BUN and  creatinine were normal at 23 and 1.2.  INR was 1.2.  Chest x-ray  revealed no edema, cardiac enlargement.  Head CT revealed no evidence of  stroke, there was mild atrophy noted.  BNP was 390.  TSH  0.193.   DISCUSSION:  The patient clinically had a neurally mediated episode of  syncope.  She needs to have prophylactic AICD placed because of poor LV  function and she would also benefit from re-synchronization therapy.  Consultation with Dr. Amil Amen will be obtained.  The patient should  remain in the hospital until AICD placement is possible.      Lyn Records, M.D.  Electronically Signed     HWS/MEDQ  D:  04/23/2006  T:  04/23/2006  Job:  [id number]   cc:   Merlene Laughter. Renae Gloss, M.D.

## 2010-08-25 NOTE — H&P (Signed)
NAMESHAQUAVIA, [name]                ACCOUNT NO.:  [id number]   MEDICAL RECORD NO.:  [id number]          PATIENT TYPE:  EMS   LOCATION:  MAJO                         FACILITY:  [facility]   PHYSICIAN:  Hettie Holstein, D.O.    DATE OF BIRTH:  [birth year]   DATE OF ADMISSION:  04/21/2006  DATE OF DISCHARGE:                              HISTORY & PHYSICAL   PRIMARY CARE PHYSICIAN:  Dr. Renae Gloss.   PRIMARY CARDIOLOGIST:  Dr. Verdis Prime.   CHIEF COMPLAINT:  Passing out.   HISTORY OF PRESENTING ILLNESS:  [name] is a very pleasant  independent 77 year old female with a known history of cardiomyopathy  felt to be possibly ischemic.  [name] had been doing quite well since her  hospitalization back in May of 2007.  [name] has undergone a recent 2-D  echocardiogram at Dr. Michaelle Copas office though we do not have these  results; I am currently contacting them now for consultation.  In any  event, [name] was in church today sitting in the pew and felt unwell and to  the point that [name] felt as though something was going to happen and  [name] stood up trying to walk out and was witnessed to have passed out by  other church members.  [name] was awake and alert by the time of arrival of  EMS, but [name] denied any chest pain or fluttering in her chest.  [name] did  have some diaphoresis, however.  In the emergency department, [name] was  noted to be normotensive and a heart rate of 62.  Initial point-of-care  markers negative.  O2 saturation 98% on room air.  [name] had an EKG that  revealed a left bundle branch block which is not new, and because of a  positive D-dimer, [name] is being further evaluated via a CT scan of her  chest for a pulmonary embolus.  The report of this study is currently  pending and we are waiting review with the radiologist.  [name] is alert  and oriented and in no acute distress in the emergency department.  Denies pain.  [name] is apparently hemodynamically stable.   PAST MEDICAL HISTORY:  As noted above.   Ischemic cardiomyopathy with  ejection fraction of 15% by previous evaluation.  Apparently [name] had  undergone cardiac evaluation back in May of this year.  In fact, [name] has  an appointment with Dr. Katrinka Blazing tomorrow.  [name] has a known chronic left  bundle branch block, hypertension, hyperlipidemia, gastroesophageal  reflux disease, osteoarthritis, depression, and status post back surgery  as well as total knee replacement in the past.   MEDICATIONS:  [name] is unable to provide, and when questioned about  pharmacy that [name] obtained her medications, [name] states that [name] does  not, [name] gets her medications through [facility] by mail; therefore, it is  not possible for me to contact anyone at this time to determine her home  medications.  I will request this be sent from her primary care  physician, Dr. Mathews Robinsons office, as well as consult Dr. Katrinka Blazing so that  this can be reconciled appropriately.  In view of her most recent hospital discharge in May of 2007, the  medications [name] was on at that time included:  1. Benicar 40 mg daily.  2. Bethanechol chloride 25 mg daily.  3. Metoprolol 50 mg b.i.d.  4. Multivitamin daily.  5. Nexium 40 mg daily.  6. Skelaxin 800 mg q.i.d.  7. Synthroid 50 mcg daily.  8. Trileptal 300 mg daily.  9. Wellbutrin XL 150 mg t.i.d.  10.Spironolactone 25 mg daily.  11.Zocor 10 mg daily.  12.Zoloft 50 mg daily.  13.Calcium daily.  14.Lasix 20 mg daily.   ALLERGIES:  As noted above, ASPIRIN, CODEINE, and SHELLFISH.   SOCIAL HISTORY:  The patient is married, lives with her husband, [name] has  1 child.  [name] is a lifetime nonsmoker, denies alcohol or illicit drugs.  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  [name] is originally from [place].   FAMILY HISTORY:  Mother is alive at age 20.  Father died at age 65 of  hypertension, no other known issues.   REVIEW OF SYSTEMS:  [name] denies any dyspnea on exertion recently.  [name]  has pain with osteoarthritis in her knees that prevents her  from  ambulating.  No nausea, vomiting, diarrhea, chest pain, swelling of her  lower extremities, melena, coffee-ground emesis, or blood in her stools.  [name] has been in her usual state of health, driving to church and  performing her own ADLs.   PHYSICAL EXAMINATION IN THE EMERGENCY DEPARTMENT:  GENERAL:  [name] is  awake, alert, in no acute distress.  VITAL SIGNS:  Her blood pressure is 103/52.  [name] was not tachycardia,  her heart rate was 62.  O2 saturation is 98%.  HEENT:  Revealed the head to be normocephalic and atraumatic.  Extraocular muscles are intact.  NECK:  Supple.  No palpable thyromegaly or mass.  CARDIOVASCULAR EXAM:  Revealed normal S1 and S2.  LUNGS:  Clear to auscultation bilaterally.  There is no increased effort  of respiration.  ABDOMEN:  Soft and nontender.  LOWER EXTREMITIES:  Revealed no edema.  NEUROLOGIC EXAM:  Revealed her to have no focal neurologic deficit.  Cranial nerves II through XII are grossly intact.  [name] was euthymic and  affect was stable.   LABORATORY DATA:  Revealed her sodium to be 140, potassium 4.2, BUN 23,  creatinine 1.2, glucose 109.  Urinalysis was negative.  D-dimer was  0.74.  Her CT scan, we are currently waiting for final interpretation.  CT of her head was negative.  EKG:  This revealed a left bundle branch  block, no change from previous.   ASSESSMENT:  1. Syncope.  2. Cardiomyopathy/probably ischemic with an ejection fraction of 15%      and a chronic left bundle branch block.  3. Hypertension.  4. Shellfish allergy.  5. Aspirin allergy.   PLAN:  At this time we are going to admit to be sure that [name] is not  suffering an acute ischemic event.  We need to review the CT scan as  currently ordered with the radiologist for final interpretation and  follow her clinical course.  We will ask Dr. Verdis Prime to see her.  [name] has undergone a 2-D echocardiogram, which we need to also find the results.  We will continue evaluation and  workup for syncope.      Hettie Holstein, D.O.  Electronically Signed     ESS/MEDQ  D:  04/21/2006  T:  04/22/2006  Job:  [id number]  cc:   Merlene Laughter. Renae Gloss, M.D.  Lyn Records, M.D.

## 2010-08-25 NOTE — Discharge Summary (Signed)
[name], [name]                ACCOUNT NO.:  [id number]   MEDICAL RECORD NO.:  [id number]          PATIENT TYPE:  INP   LOCATION:  3731                         FACILITY:  [facility]   PHYSICIAN:  Lonia Blood, M.D.       DATE OF BIRTH:  [birth year]   DATE OF ADMISSION:  04/21/2006  DATE OF DISCHARGE:  04/26/2006                               DISCHARGE SUMMARY   DISCHARGE DIAGNOSES:  1. Syncope, nonrecurrent during current hospital stay.  2. Nonischemic cardiomyopathy, ejection fraction 20-25%.  3. Left bundle branch block with a wide QRS.  4. Status post biventricular pacer placement with an automatic      implantable cardioverter-defibrillator (AICD).  5. Hypertension.  6. Gastroesophageal reflux disease.  7. Hyperlipidemia.   MEDICATIONS ON DISCHARGE:  1. Benicar 40 mg daily.  2. Metoprolol 50 mg twice per day .  3. Multivitamin daily.  4. Nexium 40 mg daily.  5. Skelaxin 800 mg 4 x day.  6. Synthroid 50 mcg daily.  7. Trileptal 300 mg daily.  8. Wellbutrin XL 150 mg 3 x day.  9. Lasix 20 mg daily.  10.Zoloft 60 mg daily.  11.Spironolactone 25 mg daily.   CONDITION ON DISCHARGE:  The patient was discharged in good condition.  At the time of discharge, she was with stable vital signs, without any  signs of acute distress.  The patient was instructed to follow up with  Dr. Verdis Prime III at [facility], and with Dr. Andi Devon.   PROCEDURES PERFORMED:  1. During this admission on April 24, 2006, the patient underwent an      AICD and biventricular placement.  2. On April 21, 2006, the patient underwent computerized tomography      scan of the chest which was negative for a pulmonary embolus.  3. On April 21, 2006, the patient underwent computerized tomography      scan of the head which was negative for acute intracranial      abnormalities.   CONSULTATIONS:  During this admission, the patient was seen in  consultation by Dr. Verdis Prime III.   HISTORY  OF PRESENT ILLNESS:  For admission history and physical please  refer to the dictated history and physical done by Dr. Angelena Sole on April 21, 2006.   HOSPITAL COURSE:  1. Syncopal event:  [name] was admitted to [facility] acute care unit where she was placed on telemetry for      observation.  Since the patient had a known nonischemic      cardiomyopathy with a low ejection fraction, a high index of      suspicion was maintained for the possibility of cardiogenic      syncope.  We were not able to prove that this syncope was      cardiogenic; but, given the patient's low ejection fraction and      wide QRS, it was felt that she would benefit from the placement of      a biventricular and ICD type of device.  The patient underwent  the      procedure on April 24, 2006 without any events.  We have checked      the patient's orthostatics and she was found to be borderline      orthostatic.  For this reason, we have slightly decreased the      patient's antihypertensive, as reflected in the discharge      medications.  2. Nonischemic cardiomyopathy:  [name] was euvolemic during this      admission, without any need for ongoing diuresis.  3. Status post recent lumbar stenosis surgery:  The patient is going      to continue her outpatient recuperation without any new changes.      Lonia Blood, M.D.  Electronically Signed     SL/MEDQ  D:  05/03/2006  T:  05/04/2006  Job:  [id number]   cc:   Merlene Laughter. Renae Gloss, M.D.

## 2010-08-25 NOTE — Procedures (Signed)
[place]  Patient:    [name], [name]                       MRN: [id number] Proc. Date: 08/29/99 Adm. Date:  [id number] Disc. Date: [id number] Attending:  Charna Elizabeth CC:         Warrick Parisian, M.D.                           Procedure Report  DATE OF BIRTH:  05/12/1933  REFERRING PHYSICIAN:  Warrick Parisian, M.D.  PROCEDURE PERFORMED:  Colonoscopy.  ENDOSCOPIST:  Anselmo Rod, M.D.  INSTRUMENT USED:  Olympus video colonoscope.  INDICATION FOR PROCEDURE:  Blood in the stool of a 77 year old black female, rule out polyp, masses, hemorrhoids, etc.  PREPROCEDURE PREPARATION:  Informed consent was procured from the patient. The patient was fasted for eight hours prior to admission and prepped with a bottle of magnesium sulfate and a gallon on NuLytely the night prior to the procedure.  PREPROCEDURE PHYSICAL EXAMINATION:  The patient had stable vital signs.  Neck supple.  Chest clear to auscultation.  S1 and S2 regular.  Abdomen soft with normal abdominal bowel sounds.  DESCRIPTION OF PROCEDURE:  The patient was placed in the left lateral decubitus position and sedated with 60 mcg of fentanyl and 6 mg of Versed intravenously.  Once the patient was adequately sedated and maintained on low-flow oxygen and continuous cardiac monitoring, the Olympus video colonoscope was advanced from the rectum to the cecum without difficulty.  The entire colon appeared healthy without evidence of diverticulosis, masses, polyps, erosions, or ulcerations.  The procedure was completed to the cecum. The appendicular orifice and the ileocecal valve were clearly visualized.  IMPRESSION:  Normal colonoscopy.  RECOMMENDATIONS:  Repeat coagulation testing to be done on an outpatient basis.  Further recommendations will be made at that time.  The patient is to follow up in the office in the next two weeks. DD:  08/29/99 TD:  09/02/99 Job:  [id number]

## 2010-09-18 ENCOUNTER — Other Ambulatory Visit (HOSPITAL_COMMUNITY): Payer: Self-pay | Admitting: Orthopedic Surgery

## 2010-09-18 DIAGNOSIS — M25561 Pain in right knee: Secondary | ICD-10-CM

## 2010-09-18 DIAGNOSIS — T84032A Mechanical loosening of internal right knee prosthetic joint, initial encounter: Secondary | ICD-10-CM

## 2010-09-27 ENCOUNTER — Encounter (HOSPITAL_COMMUNITY)
Admission: RE | Admit: 2010-09-27 | Discharge: 2010-09-27 | Disposition: A | Discharge status: Home / Self Care | Payer: Medicare Other | Source: Ambulatory Visit | Attending: Orthopedic Surgery | Admitting: Orthopedic Surgery

## 2010-09-27 ENCOUNTER — Ambulatory Visit (HOSPITAL_COMMUNITY)
Admission: RE | Admit: 2010-09-27 | Discharge: 2010-09-27 | Disposition: A | Discharge status: Home / Self Care | Payer: Medicare Other | Source: Ambulatory Visit | Attending: Orthopedic Surgery | Admitting: Orthopedic Surgery

## 2010-09-27 ENCOUNTER — Ambulatory Visit (HOSPITAL_COMMUNITY): Payer: Medicare Other

## 2010-09-27 ENCOUNTER — Other Ambulatory Visit (HOSPITAL_COMMUNITY): Payer: Self-pay | Admitting: Orthopedic Surgery

## 2010-09-27 DIAGNOSIS — C50919 Malignant neoplasm of unspecified site of unspecified female breast: Secondary | ICD-10-CM

## 2010-09-27 DIAGNOSIS — M25561 Pain in right knee: Secondary | ICD-10-CM

## 2010-09-27 DIAGNOSIS — T84032A Mechanical loosening of internal right knee prosthetic joint, initial encounter: Secondary | ICD-10-CM

## 2010-09-27 DIAGNOSIS — M25569 Pain in unspecified knee: Secondary | ICD-10-CM | POA: Insufficient documentation

## 2010-09-27 MED ORDER — TECHNETIUM TC 99M MEDRONATE IV KIT
25.0000 | PACK | Freq: Once | INTRAVENOUS | Status: AC | PRN
  Administered 2010-09-27: 25 via INTRAVENOUS

## 2010-11-02 ENCOUNTER — Ambulatory Visit (INDEPENDENT_AMBULATORY_CARE_PROVIDER_SITE_OTHER): Payer: Medicare Other | Admitting: *Deleted

## 2010-11-02 ENCOUNTER — Other Ambulatory Visit: Payer: Self-pay | Admitting: Internal Medicine

## 2010-11-02 DIAGNOSIS — I5022 Chronic systolic (congestive) heart failure: Secondary | ICD-10-CM

## 2010-11-02 DIAGNOSIS — Z95810 Presence of automatic (implantable) cardiac defibrillator: Secondary | ICD-10-CM

## 2010-11-02 DIAGNOSIS — I428 Other cardiomyopathies: Secondary | ICD-10-CM

## 2010-11-04 LAB — REMOTE ICD DEVICE
AL AMPLITUDE: 5 mv
AL IMPEDENCE ICD: 427 Ohm
BATTERY VOLTAGE: 2.58 V
BRDY-0002RA: 55 {beats}/min
BRDY-0003RA: 120 {beats}/min
CHARGE TIME: 9.5 s
DEV-0020ICD: NEGATIVE
DEVICE MODEL ICD: 215054
HV IMPEDENCE: 42 Ohm
TZAT-0001FASTVT: 1
TZAT-0001FASTVT: 2
TZAT-0001SLOWVT: 1
TZAT-0001SLOWVT: 2
TZAT-0002FASTVT: NEGATIVE
TZAT-0013FASTVT: 2
TZAT-0013SLOWVT: 2
TZAT-0013SLOWVT: 2
TZAT-0018FASTVT: NEGATIVE
TZAT-0018FASTVT: NEGATIVE
TZAT-0018SLOWVT: NEGATIVE
TZAT-0018SLOWVT: NEGATIVE
TZON-0003FASTVT: 300 ms
TZON-0003SLOWVT: 375 ms
TZST-0001FASTVT: 3
TZST-0001FASTVT: 4
TZST-0001FASTVT: 5
TZST-0001FASTVT: 6
TZST-0001FASTVT: 7
TZST-0001SLOWVT: 3
TZST-0001SLOWVT: 4
TZST-0001SLOWVT: 5
TZST-0001SLOWVT: 6
TZST-0001SLOWVT: 7
TZST-0003FASTVT: 21 J
TZST-0003FASTVT: 31 J
TZST-0003FASTVT: 31 J
TZST-0003FASTVT: 31 J
TZST-0003FASTVT: 31 J
TZST-0003SLOWVT: 21 J
TZST-0003SLOWVT: 31 J
TZST-0003SLOWVT: 31 J
TZST-0003SLOWVT: 31 J
TZST-0003SLOWVT: 31 J

## 2010-11-07 NOTE — Progress Notes (Signed)
icd remote check  

## 2011-01-17 LAB — COMPREHENSIVE METABOLIC PANEL
ALT: 29
AST: 35
Albumin: 4.2
Alkaline Phosphatase: 43
BUN: 17
CO2: 31
Calcium: 10.9 — ABNORMAL HIGH
Chloride: 102
Creatinine, Ser: 0.99
GFR calc Af Amer: >60
GFR calc non Af Amer: 55 — ABNORMAL LOW
Glucose, Bld: 87
Potassium: 4.6
Sodium: 137
Total Bilirubin: 0.5
Total Protein: 7.1

## 2011-01-17 LAB — CBC
HCT: 40.5
Hemoglobin: 13.2
MCHC: 32.7
MCV: 91.1
Platelets: 266
RBC: 4.45
RDW: 13.9
WBC: 8.1

## 2011-01-17 LAB — PROTIME-INR
INR: 1.1
Prothrombin Time: 14.1

## 2011-01-17 LAB — DIFFERENTIAL
Basophils Absolute: 0
Basophils Relative: 1
Eosinophils Absolute: 0.2
Eosinophils Relative: 2
Lymphocytes Relative: 29
Lymphs Abs: 2.4
Monocytes Absolute: 0.9 — ABNORMAL HIGH
Monocytes Relative: 11
Neutro Abs: 4.6
Neutrophils Relative %: 57

## 2011-01-17 LAB — APTT: aPTT: 26

## 2011-01-26 ENCOUNTER — Ambulatory Visit (INDEPENDENT_AMBULATORY_CARE_PROVIDER_SITE_OTHER): Payer: Medicare Other | Admitting: General Surgery

## 2011-01-26 ENCOUNTER — Encounter (INDEPENDENT_AMBULATORY_CARE_PROVIDER_SITE_OTHER): Payer: Self-pay | Admitting: General Surgery

## 2011-01-26 DIAGNOSIS — N644 Mastodynia: Secondary | ICD-10-CM

## 2011-01-26 DIAGNOSIS — Z853 Personal history of malignant neoplasm of breast: Secondary | ICD-10-CM

## 2011-01-26 NOTE — Progress Notes (Signed)
Subjective:     Patient ID: [name], female   DOB: 21-Aug-1933, 77 y.o.   MRN: [id number]  HPI The patient is doing well since I saw her in February of this year. She has had her mammogram which is negative for concerning findings. She continues to have architectural distortion at the lumpectomy site. She continues to have occasional left breast pain. She describes this as sharp pains that shoot across the lateral aspect of the breast. These do not last long enough and are not severe enough for her to take analgesics. She has not felt any masses in her breasts. She has not had any new health problems.  Review of Systems  All other systems reviewed and are negative.      Objective:   Physical Exam  Constitutional: She is oriented to person, place, and time. She appears well-developed and well-nourished. No distress.  HENT:  Head: Normocephalic and atraumatic.  Eyes: Conjunctivae and EOM are normal. Pupils are equal, round, and reactive to light. No scleral icterus.  Neck: Normal range of motion. Neck supple. No tracheal deviation present. No thyromegaly present.  Cardiovascular: Normal rate.   Pulmonary/Chest: Effort normal and breath sounds normal. No respiratory distress. She exhibits no mass, no tenderness and no bony tenderness. Right breast exhibits no inverted nipple, no mass, no nipple discharge, no skin change and no tenderness. Left breast exhibits skin change (chronic changes from radiation) and tenderness (prior lumpectomy region). Left breast exhibits no inverted nipple, no mass and no nipple discharge.    Abdominal: Soft. She exhibits no distension and no mass. There is no tenderness. There is no rebound and no guarding.  Musculoskeletal: Normal range of motion.  Lymphadenopathy:    She has no cervical adenopathy.  Neurological: She is alert and oriented to person, place, and time. She has normal reflexes. Coordination normal.  Skin: Skin is warm and dry. No rash noted.  She is not diaphoretic. No erythema. No pallor.  Psychiatric: She has a normal mood and affect. Her behavior is normal. Judgment and thought content normal.       Assessment/Plan:       History of breast cancer [id number], s/p BCT 2008 Mammogram [facility] 2. No evidence of recurrent disease. Follow up in 1 year.   Chronic L breast pain with chronic recurrent seroma, s/p excisional biopsy 01/12/2010 Pain is overall mild and intermittent. We have tried excision of chronic seroma cavity, but pt still has discomfort, although improved.   Think only way to eradicate pain would be mastectomy, but that is extreme solution for occasional discomfort.   Pt advised to use heat and tylenol when discomfort occurs.

## 2011-01-26 NOTE — Assessment & Plan Note (Signed)
Mammogram Bir[facility] 2. No evidence of recurrent disease. Follow up in 1 year.

## 2011-01-26 NOTE — Patient Instructions (Signed)
Follow up in 1-2 months

## 2011-01-26 NOTE — Assessment & Plan Note (Addendum)
Pain is overall mild and intermittent. We have tried excision of chronic seroma cavity, but pt still has discomfort, although improved.   Think only way to eradicate pain would be mastectomy, but that is extreme solution for occasional discomfort.   Pt advised to use heat and tylenol when discomfort occurs.

## 2011-01-31 ENCOUNTER — Encounter (INDEPENDENT_AMBULATORY_CARE_PROVIDER_SITE_OTHER): Payer: Self-pay | Admitting: General Surgery

## 2011-02-01 ENCOUNTER — Other Ambulatory Visit: Payer: Self-pay | Admitting: Internal Medicine

## 2011-02-01 ENCOUNTER — Encounter: Payer: Self-pay | Admitting: Internal Medicine

## 2011-02-01 ENCOUNTER — Ambulatory Visit (INDEPENDENT_AMBULATORY_CARE_PROVIDER_SITE_OTHER): Payer: Medicare Other | Admitting: *Deleted

## 2011-02-01 DIAGNOSIS — I428 Other cardiomyopathies: Secondary | ICD-10-CM

## 2011-02-04 LAB — REMOTE ICD DEVICE
AL AMPLITUDE: 5.4 mv
AL IMPEDENCE ICD: 422 Ohm
ATRIAL PACING ICD: 0 pct
BATTERY VOLTAGE: 2.57 V
BRDY-0002RA: 55 {beats}/min
BRDY-0003RA: 120 {beats}/min
CHARGE TIME: 9.5 s
DEV-0020ICD: NEGATIVE
DEVICE MODEL ICD: 215054
FVT: 0
HV IMPEDENCE: 42 Ohm
MODE SWITCH EPISODES: 0
PACEART VT: 0
TOT-0006: 20120726000000
TZAT-0001FASTVT: 1
TZAT-0001FASTVT: 2
TZAT-0001SLOWVT: 1
TZAT-0001SLOWVT: 2
TZAT-0002FASTVT: NEGATIVE
TZAT-0013FASTVT: 2
TZAT-0013SLOWVT: 2
TZAT-0013SLOWVT: 2
TZAT-0018FASTVT: NEGATIVE
TZAT-0018FASTVT: NEGATIVE
TZAT-0018SLOWVT: NEGATIVE
TZAT-0018SLOWVT: NEGATIVE
TZON-0003FASTVT: 300 ms
TZON-0003SLOWVT: 375 ms
TZST-0001FASTVT: 3
TZST-0001FASTVT: 4
TZST-0001FASTVT: 5
TZST-0001FASTVT: 6
TZST-0001FASTVT: 7
TZST-0001SLOWVT: 3
TZST-0001SLOWVT: 4
TZST-0001SLOWVT: 5
TZST-0001SLOWVT: 6
TZST-0001SLOWVT: 7
TZST-0003FASTVT: 21 J
TZST-0003FASTVT: 31 J
TZST-0003FASTVT: 31 J
TZST-0003FASTVT: 31 J
TZST-0003FASTVT: 31 J
TZST-0003SLOWVT: 21 J
TZST-0003SLOWVT: 31 J
TZST-0003SLOWVT: 31 J
TZST-0003SLOWVT: 31 J
TZST-0003SLOWVT: 31 J
VENTRICULAR PACING ICD: 100 pct
VF: 0

## 2011-02-07 ENCOUNTER — Encounter: Payer: Medicare Other | Admitting: Internal Medicine

## 2011-02-08 NOTE — Progress Notes (Signed)
ICD remote 

## 2011-02-20 ENCOUNTER — Other Ambulatory Visit (HOSPITAL_BASED_OUTPATIENT_CLINIC_OR_DEPARTMENT_OTHER): Payer: Medicare Other | Admitting: Lab

## 2011-02-20 ENCOUNTER — Other Ambulatory Visit: Payer: Self-pay | Admitting: Oncology

## 2011-02-20 DIAGNOSIS — I10 Essential (primary) hypertension: Secondary | ICD-10-CM

## 2011-02-20 DIAGNOSIS — C50419 Malignant neoplasm of upper-outer quadrant of unspecified female breast: Secondary | ICD-10-CM

## 2011-02-20 LAB — CBC WITH DIFFERENTIAL/PLATELET
BASO%: 0.4 % (ref 0.0–2.0)
Basophils Absolute: 0 10*3/uL (ref 0.0–0.1)
EOS%: 4.4 % (ref 0.0–7.0)
Eosinophils Absolute: 0.3 10*3/uL (ref 0.0–0.5)
HCT: 37.4 % (ref 34.8–46.6)
HGB: 12.5 g/dL (ref 11.6–15.9)
LYMPH%: 36.3 % (ref 14.0–49.7)
MCH: 28.3 pg (ref 25.1–34.0)
MCHC: 33.4 g/dL (ref 31.5–36.0)
MCV: 84.8 fL (ref 79.5–101.0)
MONO#: 0.5 10*3/uL (ref 0.1–0.9)
MONO%: 6.2 % (ref 0.0–14.0)
NEUT#: 4 10*3/uL (ref 1.5–6.5)
NEUT%: 52.7 % (ref 38.4–76.8)
Platelets: 133 10*3/uL — ABNORMAL LOW (ref 145–400)
RBC: 4.41 10*6/uL (ref 3.70–5.45)
RDW: 15.1 % — ABNORMAL HIGH (ref 11.2–14.5)
WBC: 7.6 10*3/uL (ref 3.9–10.3)
lymph#: 2.7 10*3/uL (ref 0.9–3.3)
nRBC: 0 % (ref 0–0)

## 2011-02-21 LAB — COMPREHENSIVE METABOLIC PANEL
ALT: 27 U/L (ref 0–35)
AST: 26 U/L (ref 0–37)
Albumin: 4.3 g/dL (ref 3.5–5.2)
Alkaline Phosphatase: 72 U/L (ref 39–117)
BUN: 21 mg/dL (ref 6–23)
CO2: 29 mEq/L (ref 19–32)
Calcium: 10.4 mg/dL (ref 8.4–10.5)
Chloride: 101 mEq/L (ref 96–112)
Creatinine, Ser: 0.78 mg/dL (ref 0.50–1.10)
Glucose, Bld: 149 mg/dL — ABNORMAL HIGH (ref 70–99)
Potassium: 3.9 mEq/L (ref 3.5–5.3)
Sodium: 137 mEq/L (ref 135–145)
Total Bilirubin: 0.2 mg/dL — ABNORMAL LOW (ref 0.3–1.2)
Total Protein: 6.9 g/dL (ref 6.0–8.3)

## 2011-02-21 LAB — VITAMIN D 25 HYDROXY (VIT D DEFICIENCY, FRACTURES): Vit D, 25-Hydroxy: 54 ng/mL (ref 30–89)

## 2011-02-27 ENCOUNTER — Telehealth: Payer: Self-pay | Admitting: Oncology

## 2011-02-27 ENCOUNTER — Ambulatory Visit (HOSPITAL_BASED_OUTPATIENT_CLINIC_OR_DEPARTMENT_OTHER): Payer: Medicare Other | Admitting: Oncology

## 2011-02-27 DIAGNOSIS — C50919 Malignant neoplasm of unspecified site of unspecified female breast: Secondary | ICD-10-CM

## 2011-02-27 DIAGNOSIS — E559 Vitamin D deficiency, unspecified: Secondary | ICD-10-CM

## 2011-02-27 NOTE — Progress Notes (Signed)
Hematology and Oncology Follow Up Visit  [name] [id number] 25-Dec-1933 77 y.o. 02/27/2011 4:43 PM   Principle Diagnosis: T1 C. N0 ER/PR negative breast cancer status post lumpectomy 02/11/2007 status post radiation completed 05/27/2007 2. History  of  Hypertension with  cardiac arrhythmia  and pacemaker  Interim History:  Is doing well. No intercurrent problems. Mammogram in September was normal.  Medications: I have reviewed the patient's current medications.  Allergies:  Allergies  Allergen Reactions  . Aspirin Nausea And Vomiting  . Codeine Nausea And Vomiting    Past Medical History, Surgical history, Social history, and Family History were reviewed and updated.  Review of Systems: Constitutional:  Negative for fever, chills, night sweats, anorexia, weight loss, pain. Cardiovascular: no chest pain or dyspnea on exertion Respiratory: no cough, shortness of breath, or wheezing Neurological: no TIA or stroke symptoms Dermatological: negative ENT: negative Skin Gastrointestinal: no abdominal pain, change in bowel habits, or black or bloody stools Genito-Urinary: no dysuria, trouble voiding, or hematuria Hematological and Lymphatic: negative Breast: negative for breast lumps Musculoskeletal: negative Remaining ROS negative.  Physical Exam: Blood pressure 94/57, pulse 73, temperature 98.6 F (37 C), height 5' (1.524 m), weight 152 lb 1.6 oz (68.992 kg). ECOG: 0 General appearance: alert, cooperative and appears stated age Head: Normocephalic, without obvious abnormality, atraumatic Neck: no adenopathy, no carotid bruit, no JVD, supple, symmetrical, trachea midline and thyroid not enlarged, symmetric, no tenderness/mass/nodules Lymph nodes: Cervical, supraclavicular, and axillary nodes normal. Cardiac : Pacemaker present left chest, heart sounds normal Pulmonary: Normal breath sounds  Breasts: Right and left breast normal no axillary adenopathy tender left axillary  area Abdomen: Normal Extremities normal Neuro: Normal  Lab Results: Lab Results  Component Value Date   WBC 7.6 02/20/2011   HGB 12.5 02/20/2011   HCT 37.4 02/20/2011   MCV 84.8 02/20/2011   PLT 133* 02/20/2011     Chemistry      Component Value Date/Time   NA 137 02/20/2011 1355   NA 137 02/20/2011 1355   NA 137 02/20/2011 1355   K 3.9 02/20/2011 1355   K 3.9 02/20/2011 1355   K 3.9 02/20/2011 1355   CL 101 02/20/2011 1355   CL 101 02/20/2011 1355   CL 101 02/20/2011 1355   CO2 29 02/20/2011 1355   CO2 29 02/20/2011 1355   CO2 29 02/20/2011 1355   BUN 21 02/20/2011 1355   BUN 21 02/20/2011 1355   BUN 21 02/20/2011 1355   CREATININE 0.78 02/20/2011 1355   CREATININE 0.78 02/20/2011 1355   CREATININE 0.78 02/20/2011 1355      Component Value Date/Time   CALCIUM 10.4 02/20/2011 1355   CALCIUM 10.4 02/20/2011 1355   CALCIUM 10.4 02/20/2011 1355   ALKPHOS 72 02/20/2011 1355   ALKPHOS 72 02/20/2011 1355   ALKPHOS 72 02/20/2011 1355   AST 26 02/20/2011 1355   AST 26 02/20/2011 1355   AST 26 02/20/2011 1355   ALT 27 02/20/2011 1355   ALT 27 02/20/2011 1355   ALT 27 02/20/2011 1355   BILITOT 0.2* 02/20/2011 1355   BILITOT 0.2* 02/20/2011 1355   BILITOT 0.2* 02/20/2011 1355       Radiological Studies: chest X-ray NA Mammogram Recent September mammogram normal Bone density NA  Impression and Plan: She is doing well. I will see her in 6 months for followup. She will continue the same regimen  More than 50% of the visit was spent in patient-related counselling   Pierce Crane, MD 11/20/20124:43  PM  

## 2011-02-27 NOTE — Telephone Encounter (Signed)
Gv pt for [id number]

## 2011-05-10 ENCOUNTER — Ambulatory Visit (INDEPENDENT_AMBULATORY_CARE_PROVIDER_SITE_OTHER): Payer: Medicare Other | Admitting: *Deleted

## 2011-05-10 DIAGNOSIS — I428 Other cardiomyopathies: Secondary | ICD-10-CM

## 2011-05-10 DIAGNOSIS — Z95810 Presence of automatic (implantable) cardiac defibrillator: Secondary | ICD-10-CM

## 2011-05-11 ENCOUNTER — Encounter: Payer: Self-pay | Admitting: Internal Medicine

## 2011-05-16 ENCOUNTER — Encounter: Payer: Self-pay | Admitting: Family Medicine

## 2011-05-16 ENCOUNTER — Ambulatory Visit
Admission: RE | Admit: 2011-05-16 | Discharge: 2011-05-16 | Disposition: A | Discharge status: Home / Self Care | Payer: Medicare Other | Source: Ambulatory Visit | Attending: Family Medicine | Admitting: Family Medicine

## 2011-05-16 ENCOUNTER — Telehealth: Payer: Self-pay

## 2011-05-16 ENCOUNTER — Ambulatory Visit (INDEPENDENT_AMBULATORY_CARE_PROVIDER_SITE_OTHER): Payer: 59 | Admitting: Family Medicine

## 2011-05-16 VITALS — BP 137/76 | HR 73 | Temp 98.0°F | Resp 18 | Ht 59.5 in | Wt 146.0 lb

## 2011-05-16 DIAGNOSIS — R350 Frequency of micturition: Secondary | ICD-10-CM

## 2011-05-16 DIAGNOSIS — R109 Unspecified abdominal pain: Secondary | ICD-10-CM

## 2011-05-16 DIAGNOSIS — R10A0 Flank pain, unspecified side: Secondary | ICD-10-CM

## 2011-05-16 LAB — POCT CBC
Granulocyte percent: 59.1 %G (ref 37–80)
HCT, POC: 38.9 % (ref 37.7–47.9)
Hemoglobin: 11.9 g/dL — AB (ref 12.2–16.2)
Lymph, poc: 3 (ref 0.6–3.4)
MCH, POC: 26.8 pg — AB (ref 27–31.2)
MCHC: 30.6 g/dL — AB (ref 31.8–35.4)
MCV: 87.5 fL (ref 80–97)
MID (cbc): 0.6 (ref 0–0.9)
MPV: 9.8 fL (ref 0–99.8)
POC Granulocyte: 5.3 (ref 2–6.9)
POC LYMPH PERCENT: 33.7 %L (ref 10–50)
POC MID %: 7.2 %M (ref 0–12)
Platelet Count, POC: 145 10*3/uL (ref 142–424)
RBC: 4.44 M/uL (ref 4.04–5.48)
RDW, POC: 14.9 %
WBC: 8.9 10*3/uL (ref 4.6–10.2)

## 2011-05-16 LAB — POCT UA - MICROSCOPIC ONLY
Casts, Ur, LPF, POC: NEGATIVE
Crystals, Ur, HPF, POC: NEGATIVE
Mucus, UA: NEGATIVE
Yeast, UA: NEGATIVE

## 2011-05-16 LAB — POCT URINALYSIS DIPSTICK
Bilirubin, UA: NEGATIVE
Blood, UA: NEGATIVE
Glucose, UA: NEGATIVE
Ketones, UA: NEGATIVE
Leukocytes, UA: NEGATIVE
Nitrite, UA: NEGATIVE
Protein, UA: NEGATIVE
Spec Grav, UA: 1.015
Urobilinogen, UA: 0.2
pH, UA: 5.5

## 2011-05-16 MED ORDER — CIPROFLOXACIN HCL 500 MG PO TABS: 500.0000 mg | ORAL_TABLET | Freq: Two times a day (BID) | ORAL | Status: AC

## 2011-05-16 NOTE — Patient Instructions (Signed)
Antibiotics are called to [facility].  Start after ultrasound done.  Ibuprofen for pain

## 2011-05-16 NOTE — Telephone Encounter (Signed)
LMOM ULTRASOUND WAS NORMAL AND PER DR LAUENSTEIN ADVISED TO PICK UP HER RXS

## 2011-05-16 NOTE — Progress Notes (Signed)
Results for orders placed in visit on 05/16/11  POCT UA - MICROSCOPIC ONLY      Component Value Range   WBC, Ur, HPF, POC 1-2     RBC, urine, microscopic 0-1     Bacteria, U Microscopic trace     Mucus, UA negative     Epithelial cells, urine per micros 1-2     Crystals, Ur, HPF, POC negative     Casts, Ur, LPF, POC negative     Yeast, UA negative    POCT URINALYSIS DIPSTICK      Component Value Range   Color, UA yellow     Clarity, UA clear     Glucose, UA negative     Bilirubin, UA negative     Ketones, UA negative     Spec Grav, UA 1.015     Blood, UA negative     pH, UA 5.5     Protein, UA negative     Urobilinogen, UA 0.2     Nitrite, UA negative     Leukocytes, UA Negative     is a 78 year old woman who comes in with left flank pain and polyuria. The symptoms began 3 days prior to this visit. He had no chills, fever, rigors. She has not had a urinary infection in over a year. She does not have a cough, and vomiting, diarrhea. She does admit to some nausea.  Objective: Elderly woman in no acute distress with skin that is warm and dry. HEENT unremarkable. Chest clear. Heart regular no murmur. Pacemaking device located left upper chest. Abdomen tender in the left side without mass or splenomegaly. There is no guarding or rebound she does have left CVA tenderness. Bony structures and back are nontender. Gait is normal.  Assessment: Elderly woman with new onset left flank pain and negative urine. Possibilities include aneurysm, diverticulosis, or occult stone. In addition it is possible she has an occult UTI.  Plan: Ultrasound of the abdomen today, CBC pending.  Results for orders placed in visit on 05/16/11  POCT UA - MICROSCOPIC ONLY      Component Value Range   WBC, Ur, HPF, POC 1-2     RBC, urine, microscopic 0-1     Bacteria, U Microscopic trace     Mucus, UA negative     Epithelial cells, urine per micros 1-2     Crystals, Ur, HPF, POC negative     Casts, Ur, LPF, POC  negative     Yeast, UA negative    POCT URINALYSIS DIPSTICK      Component Value Range   Color, UA yellow     Clarity, UA clear     Glucose, UA negative     Bilirubin, UA negative     Ketones, UA negative     Spec Grav, UA 1.015     Blood, UA negative     pH, UA 5.5     Protein, UA negative     Urobilinogen, UA 0.2     Nitrite, UA negative     Leukocytes, UA Negative    POCT CBC      Component Value Range   WBC 8.9  4.6 - 10.2 (K/uL)   Lymph, poc 3.0  0.6 - 3.4    POC LYMPH PERCENT 33.7  10 - 50 (%L)   MID (cbc) 0.6  0 - 0.9    POC MID % 7.2  0 - 12 (%M)   POC Granulocyte 5.3  2 - 6.9  Granulocyte percent 59.1  37 - 80 (%G)   RBC 4.44  4.04 - 5.48 (M/uL)   Hemoglobin 11.9 (*) 12.2 - 16.2 (g/dL)   HCT, POC 16.1  09.6 - 47.9 (%)   MCV 87.5  80 - 97 (fL)   MCH, POC 26.8 (*) 27 - 31.2 (pg)   MCHC 30.6 (*) 31.8 - 35.4 (g/dL)   RDW, POC 04.5     Platelet Count, POC 145  142 - 424 (K/uL)   MPV 9.8  0 - 99.8 (fL)

## 2011-05-18 LAB — URINE CULTURE
Colony Count: NO GROWTH
Organism ID, Bacteria: NO GROWTH

## 2011-05-20 LAB — REMOTE ICD DEVICE
AL AMPLITUDE: 4.5 mv
AL IMPEDENCE ICD: 406 Ohm
ATRIAL PACING ICD: 0 pct
BATTERY VOLTAGE: 2.57 V
BRDY-0002RA: 55 {beats}/min
BRDY-0003RA: 120 {beats}/min
CHARGE TIME: 9.5 s
DEV-0020ICD: NEGATIVE
DEVICE MODEL ICD: 215054
FVT: 0
HV IMPEDENCE: 42 Ohm
MODE SWITCH EPISODES: 0
PACEART VT: 0
TOT-0006: 20121025000000
TZAT-0001FASTVT: 1
TZAT-0001FASTVT: 2
TZAT-0001SLOWVT: 1
TZAT-0001SLOWVT: 2
TZAT-0002FASTVT: NEGATIVE
TZAT-0013FASTVT: 2
TZAT-0013SLOWVT: 2
TZAT-0013SLOWVT: 2
TZAT-0018FASTVT: NEGATIVE
TZAT-0018FASTVT: NEGATIVE
TZAT-0018SLOWVT: NEGATIVE
TZAT-0018SLOWVT: NEGATIVE
TZON-0003FASTVT: 300 ms
TZON-0003SLOWVT: 375 ms
TZST-0001FASTVT: 3
TZST-0001FASTVT: 4
TZST-0001FASTVT: 5
TZST-0001FASTVT: 6
TZST-0001FASTVT: 7
TZST-0001SLOWVT: 3
TZST-0001SLOWVT: 4
TZST-0001SLOWVT: 5
TZST-0001SLOWVT: 6
TZST-0001SLOWVT: 7
TZST-0003FASTVT: 21 J
TZST-0003FASTVT: 31 J
TZST-0003FASTVT: 31 J
TZST-0003FASTVT: 31 J
TZST-0003FASTVT: 31 J
TZST-0003SLOWVT: 21 J
TZST-0003SLOWVT: 31 J
TZST-0003SLOWVT: 31 J
TZST-0003SLOWVT: 31 J
TZST-0003SLOWVT: 31 J
VENTRICULAR PACING ICD: 100 pct
VF: 0

## 2011-05-24 NOTE — Progress Notes (Signed)
Remote icd check  

## 2011-06-06 ENCOUNTER — Encounter: Payer: Self-pay | Admitting: *Deleted

## 2011-07-24 ENCOUNTER — Encounter: Payer: Medicare Other | Admitting: Internal Medicine

## 2011-07-29 ENCOUNTER — Encounter (HOSPITAL_COMMUNITY): Payer: Self-pay | Admitting: *Deleted

## 2011-07-29 ENCOUNTER — Emergency Department (HOSPITAL_COMMUNITY)
Admission: EM | Admit: 2011-07-29 | Discharge: 2011-07-29 | Disposition: A | Discharge status: Home / Self Care | Payer: Medicare Other | Attending: Emergency Medicine | Admitting: Emergency Medicine

## 2011-07-29 ENCOUNTER — Emergency Department (HOSPITAL_COMMUNITY): Payer: Medicare Other

## 2011-07-29 DIAGNOSIS — Z853 Personal history of malignant neoplasm of breast: Secondary | ICD-10-CM | POA: Insufficient documentation

## 2011-07-29 DIAGNOSIS — Z95810 Presence of automatic (implantable) cardiac defibrillator: Secondary | ICD-10-CM | POA: Insufficient documentation

## 2011-07-29 DIAGNOSIS — G319 Degenerative disease of nervous system, unspecified: Secondary | ICD-10-CM | POA: Insufficient documentation

## 2011-07-29 DIAGNOSIS — E785 Hyperlipidemia, unspecified: Secondary | ICD-10-CM | POA: Insufficient documentation

## 2011-07-29 DIAGNOSIS — I10 Essential (primary) hypertension: Secondary | ICD-10-CM | POA: Insufficient documentation

## 2011-07-29 DIAGNOSIS — I2589 Other forms of chronic ischemic heart disease: Secondary | ICD-10-CM | POA: Insufficient documentation

## 2011-07-29 DIAGNOSIS — R5383 Other fatigue: Secondary | ICD-10-CM | POA: Insufficient documentation

## 2011-07-29 DIAGNOSIS — I509 Heart failure, unspecified: Secondary | ICD-10-CM | POA: Insufficient documentation

## 2011-07-29 DIAGNOSIS — R531 Weakness: Secondary | ICD-10-CM

## 2011-07-29 DIAGNOSIS — I5020 Unspecified systolic (congestive) heart failure: Secondary | ICD-10-CM | POA: Insufficient documentation

## 2011-07-29 DIAGNOSIS — K219 Gastro-esophageal reflux disease without esophagitis: Secondary | ICD-10-CM | POA: Insufficient documentation

## 2011-07-29 DIAGNOSIS — R5381 Other malaise: Secondary | ICD-10-CM | POA: Insufficient documentation

## 2011-07-29 DIAGNOSIS — R4182 Altered mental status, unspecified: Secondary | ICD-10-CM | POA: Insufficient documentation

## 2011-07-29 LAB — DIFFERENTIAL
Basophils Absolute: 0 10*3/uL (ref 0.0–0.1)
Basophils Relative: 0 % (ref 0–1)
Eosinophils Absolute: 0.3 10*3/uL (ref 0.0–0.7)
Eosinophils Relative: 3 % (ref 0–5)
Lymphocytes Relative: 22 % (ref 12–46)
Lymphs Abs: 2.2 10*3/uL (ref 0.7–4.0)
Monocytes Absolute: 0.6 10*3/uL (ref 0.1–1.0)
Monocytes Relative: 6 % (ref 3–12)
Neutro Abs: 7 10*3/uL (ref 1.7–7.7)
Neutrophils Relative %: 69 % (ref 43–77)

## 2011-07-29 LAB — URINALYSIS, ROUTINE W REFLEX MICROSCOPIC
Bilirubin Urine: NEGATIVE
Glucose, UA: NEGATIVE mg/dL
Hgb urine dipstick: NEGATIVE
Ketones, ur: NEGATIVE mg/dL
Leukocytes, UA: NEGATIVE
Nitrite: NEGATIVE
Protein, ur: NEGATIVE mg/dL
Specific Gravity, Urine: 1.011 (ref 1.005–1.030)
Urobilinogen, UA: 0.2 mg/dL (ref 0.0–1.0)
pH: 7.5 (ref 5.0–8.0)

## 2011-07-29 LAB — COMPREHENSIVE METABOLIC PANEL
ALT: 23 U/L (ref 0–35)
AST: 25 U/L (ref 0–37)
Albumin: 3.6 g/dL (ref 3.5–5.2)
Alkaline Phosphatase: 83 U/L (ref 39–117)
BUN: 19 mg/dL (ref 6–23)
CO2: 27 mEq/L (ref 19–32)
Calcium: 10.3 mg/dL (ref 8.4–10.5)
Chloride: 103 mEq/L (ref 96–112)
Creatinine, Ser: 0.94 mg/dL (ref 0.50–1.10)
GFR calc Af Amer: 67 mL/min — ABNORMAL LOW (ref >90)
GFR calc non Af Amer: 58 mL/min — ABNORMAL LOW (ref >90)
Glucose, Bld: 91 mg/dL (ref 70–99)
Potassium: 4 mEq/L (ref 3.5–5.1)
Sodium: 140 mEq/L (ref 135–145)
Total Bilirubin: 0.2 mg/dL — ABNORMAL LOW (ref 0.3–1.2)
Total Protein: 6.9 g/dL (ref 6.0–8.3)

## 2011-07-29 LAB — CBC
HCT: 37.6 % (ref 36.0–46.0)
Hemoglobin: 12.6 g/dL (ref 12.0–15.0)
MCH: 28.6 pg (ref 26.0–34.0)
MCHC: 33.5 g/dL (ref 30.0–36.0)
MCV: 85.5 fL (ref 78.0–100.0)
Platelets: 128 10*3/uL — ABNORMAL LOW (ref 150–400)
RBC: 4.4 MIL/uL (ref 3.87–5.11)
RDW: 15.2 % (ref 11.5–15.5)
WBC: 10.1 10*3/uL (ref 4.0–10.5)

## 2011-07-29 MED ORDER — SODIUM CHLORIDE 0.9 % IV BOLUS (SEPSIS)
500.0000 mL | INTRAVENOUS | Status: AC
  Administered 2011-07-29: 500 mL via INTRAVENOUS

## 2011-07-29 NOTE — ED Provider Notes (Signed)
History     CSN: [id number]  Arrival date & time 07/29/11  1129   First MD Initiated Contact with Patient 07/29/11 1134      Chief Complaint  Patient presents with  . 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  Patient states that she felt sleepy and less alert.  She denies chest pain, shortness of breath, weakness, nausea/vomiting, abdominal pain, headache, dizziness, syncope, fever, ear pain, or edema of her extremities.  Patient states that this morning she did take a Benadryl before going to church.  She states that she was having trouble with allergies and she needed to take something before going to church.  Patient states she did not have any firing of her defibrillator.  Past Medical History  Diagnosis Date  . Syncope   . Ischemic cardiomyopathy   . Systolic CHF   . Depression   . ICD (implantable cardiac defibrillator), biventricular, in situ   . LBBB (left bundle branch block)   . Dyslipidemia   . CHF (congestive heart failure)     PACEMAKER & DEFIB  . Fainted 04/21/06    AT [facility]  . Hypertension   . Pacemaker   . Arthritis   . Wears glasses   . Headache   . Cancer     breast  . GERD (gastroesophageal reflux disease)   . HLD (hyperlipidemia)     Past Surgical History  Procedure Date  . Total knee arthroplasty 05/17/01    RIGHT KNEE  . Lumbar fusion 2006  . Mastectomy, partial 2008    GOT PACEMAKER AND DEFIB AT THAT TIME  . Breast surgery 2000    LUMP REMOVAL. STAGE 1 CANCER  . Joint replacement 06/14/01    right  . Pacemaker insertion 04/23/06    Family History  Problem Relation Age of Onset  . Hypertension Mother   . Hypertension Father     History  Substance Use Topics  . Smoking status: Never Smoker   . Smokeless tobacco: Never Used  . Alcohol Use: No    OB History    Grav Para Term Preterm Abortions TAB SAB Ect Mult Living                  Review of Systems All pertinent positives and negatives reviewed in the history of present illness  Allergies  Shellfish allergy; Aspirin; and Codeine  Home Medications   Current Outpatient Rx  Name Route Sig Dispense Refill  . ACETAMINOPHEN 325 MG PO TABS Oral Take 650 mg by mouth 2 (two) times daily.     . BUPROPION HCL ER (SR) 150 MG PO TB12 Oral Take 450 mg by mouth 2 (two) times daily.    Marland Kitchen CALCIUM 500 PO Oral Take 500 mg by mouth daily.     Marland Kitchen VITAMIN D 2000 UNITS PO CAPS Oral Take 2 capsules by mouth daily.     . FUROSEMIDE 40 MG PO TABS Oral Take 20-40 mg by mouth 2 (two) times daily. 40 mg in the morning and 20 mg at night    . LEVOTHYROXINE SODIUM 25 MCG PO TABS Oral Take 25 mcg by mouth daily.     Marland Kitchen METOPROLOL TARTRATE 50 MG PO TABS Oral Take 50 mg by mouth 2 (two) times daily.     Marland Kitchen OLMESARTAN MEDOXOMIL 40 MG PO TABS Oral Take 40 mg by mouth daily.     Marland Kitchen  OMEPRAZOLE 40 MG PO CPDR Oral Take 40 mg by mouth daily.      Marland Kitchen OXCARBAZEPINE 150 MG PO TABS Oral Take 150 mg by mouth daily.     Marland Kitchen POTASSIUM CHLORIDE CRYS ER 20 MEQ PO TBCR Oral Take 20 mEq by mouth daily.     . SERTRALINE HCL 100 MG PO TABS Oral Take 100 mg by mouth 2 (two) times daily.     Marland Kitchen VITAMIN E 800 UNITS PO CAPS Oral Take 800 Units by mouth daily.       BP 124/65  Pulse 69  Temp(Src) 97.1 F (36.2 C) (Oral)  Resp 20  SpO2 98%  Physical Exam Physical Examination: General appearance - alert, well appearing, and in no distress, oriented to person, place, and time, normal appearing weight and well hydrated Mental status - alert, oriented to person, place, and time, normal mood, behavior, speech, dress, motor activity, and thought processes Eyes - pupils equal and reactive, extraocular eye movements intact Ears - bilateral TM's and external ear canals normal Nose - normal and patent, no erythema, discharge or polyps Mouth - mucous membranes moist, pharynx normal without lesions Neck - supple, no  significant adenopathy Lymphatics - no palpable lymphadenopathy Chest - clear to auscultation, no wheezes, rales or rhonchi, symmetric air entry, no tachypnea, retractions or cyanosis Heart - normal rate, regular rhythm, normal S1, S2, no murmurs, rubs, clicks or gallops Abdomen - soft, nontender, nondistended, no masses or organomegaly Neurological - alert, oriented, normal speech, no focal findings or movement disorder noted, neck supple without rigidity, motor and sensory grossly normal bilaterally, normal muscle tone, no tremors, strength 5/5, Romberg sign negative, normal gait and station Extremities - peripheral pulses normal, no pedal edema, no clubbing or cyanosis, no pedal edema noted, intact peripheral pulses Skin - normal coloration and turgor, no rashes, no suspicious skin lesions noted  ED Course  Procedures (including critical care time)  Labs Reviewed  COMPREHENSIVE METABOLIC PANEL - Abnormal; Notable for the following:    Total Bilirubin 0.2 (*)    GFR calc non Af Amer 58 (*)    GFR calc Af Amer 67 (*)    All other components within normal limits  CBC - Abnormal; Notable for the following:    Platelets 128 (*)    All other components within normal limits  DIFFERENTIAL  URINALYSIS, ROUTINE W REFLEX MICROSCOPIC   Ct Head Wo Contrast  07/29/2011  *RADIOLOGY REPORT*  Clinical Data: Decreased mental status  CT HEAD WITHOUT CONTRAST  Technique:  Contiguous axial images were obtained from the base of the skull through the vertex without contrast.  Comparison: 01/01/2010  Findings:  Grossly unchanged mild diffuse atrophy with scattered minimal periventricular hypodensities compatible with microvascular ischemic disease. Punctate calcification within the left internal capsule is unchanged.  The gray-white differentiation is otherwise well maintained without CT evidence to large territory infarct. Unchanged dural calcifications.   Unchanged size and configuration of ventricles and  basilar cisterns.  No midline shift.  No intraparenchymal or extra-axial mass or hemorrhage.  Paranasal sinuses and mastoid air cells are normal. Mild hyperostosis, unchanged.  Regional soft tissues are normal. Vascular calcifications.  IMPRESSION: Unchanged mild atrophy and microvascular ischemic disease without acute intracranial process.  Original Report Authenticated By: Waynard Reeds, M.D.   Filed Vitals:   07/29/11 1515 07/29/11 1530 07/29/11 1545 07/29/11 1600  BP: 138/65 132/73 111/66 147/67  Pulse: 65 63 63 61  Temp:      TempSrc:  Resp:    15  SpO2: 97% 95% 95% 98%   Patient has been stable and improved here in the emergency department.  Patient said she would like to go home and follow up with her primary care doctor for recheck.  Patient's vital signs have steadily improved while being here in the emergency department and following 500 mL's of normal saline.  This may been associated with the ingestion of Benadryl prior to going to church.  At her age range this can cause some significant side effects.  Patient has been asked to return here if any worsening in her condition she is told to increase her fluid intake at home.  The patient was ambulated, vigorously in the emergency department and did not feel any abnormalities such as dizziness, near-syncope or weakness.   MDM  MDM Reviewed: nursing note and vitals Reviewed previous: labs and ECG Interpretation: labs, ECG and CT scan   Date: 07/29/2011  Rate: 62  Rhythm: normal sinus rhythm  QRS Axis: normal  Intervals: normal  ST/T Wave abnormalities: normal  Conduction Disutrbances:none  Narrative Interpretation:   Old EKG Reviewed: unchanged            Carlyle Dolly, PA-C 07/29/11 1613

## 2011-07-29 NOTE — Discharge Instructions (Signed)
Return to the emergency department for any worsening in your condition. follow up your primary care doctor tomorrow for a recheck. Increase your fluids.

## 2011-07-29 NOTE — ED Notes (Signed)
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

## 2011-07-30 NOTE — ED Provider Notes (Signed)
Medical screening examination/treatment/procedure(s) were performed by non-physician practitioner and as supervising physician I was immediately available for consultation/collaboration.  I saw the patient along with C. Lawyer and agree with his note, assessment, and plan.  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  She did not lose consciousness.  This episode lasted several minutes, then resolved.  She feels fine now and is without complaint.  She has an AICD in place but is absolutely certain she was not shocked.  On exam, the vitals are stable and the patient appears fine.  The heart and lung exam is unremarkable and she is neurologically intact.  The workup is unremarkable, including labs and ekg.  She was observed in the ER for several hours and had no further incident.  The patient was offered admission, but she is adamant about wanting to go home.  She has assured me that she will return should her symptoms worsen or change.    Geoffery Lyons, MD 07/30/11 [id number]

## 2011-07-31 ENCOUNTER — Other Ambulatory Visit: Payer: Self-pay | Admitting: Internal Medicine

## 2011-07-31 DIAGNOSIS — Z9889 Other specified postprocedural states: Secondary | ICD-10-CM

## 2011-07-31 DIAGNOSIS — N63 Unspecified lump in unspecified breast: Secondary | ICD-10-CM

## 2011-07-31 DIAGNOSIS — N644 Mastodynia: Secondary | ICD-10-CM

## 2011-08-07 ENCOUNTER — Other Ambulatory Visit: Payer: Self-pay | Admitting: Radiology

## 2011-08-09 ENCOUNTER — Encounter: Payer: Self-pay | Admitting: Internal Medicine

## 2011-08-09 ENCOUNTER — Ambulatory Visit (INDEPENDENT_AMBULATORY_CARE_PROVIDER_SITE_OTHER): Payer: Medicare Other | Admitting: Internal Medicine

## 2011-08-09 VITALS — BP 106/62 | HR 64 | Ht 60.0 in | Wt 141.4 lb

## 2011-08-09 DIAGNOSIS — Z95810 Presence of automatic (implantable) cardiac defibrillator: Secondary | ICD-10-CM

## 2011-08-09 DIAGNOSIS — I428 Other cardiomyopathies: Secondary | ICD-10-CM

## 2011-08-09 DIAGNOSIS — I639 Cerebral infarction, unspecified: Secondary | ICD-10-CM | POA: Insufficient documentation

## 2011-08-09 DIAGNOSIS — R55 Syncope and collapse: Secondary | ICD-10-CM | POA: Insufficient documentation

## 2011-08-09 DIAGNOSIS — Z950 Presence of cardiac pacemaker: Secondary | ICD-10-CM

## 2011-08-09 LAB — ICD DEVICE OBSERVATION
AL AMPLITUDE: 4.4 mv
AL IMPEDENCE ICD: 422 Ohm
AL THRESHOLD: 0.6 V
ATRIAL PACING ICD: 0 pct
BATTERY VOLTAGE: 2.57 V
CHARGE TIME: 9.5 s
DEV-0020ICD: NEGATIVE
DEVICE MODEL ICD: 215054
HV IMPEDENCE: 42 Ohm
LV LEAD AMPLITUDE: 25 mv
LV LEAD IMPEDENCE ICD: 787 Ohm
LV LEAD THRESHOLD: 0.8 V
RV LEAD AMPLITUDE: 17 mv
RV LEAD IMPEDENCE ICD: 638 Ohm
RV LEAD THRESHOLD: 0.6 V
TZAT-0001FASTVT: 1
TZAT-0001FASTVT: 2
TZAT-0001SLOWVT: 1
TZAT-0001SLOWVT: 2
TZAT-0002FASTVT: NEGATIVE
TZAT-0013FASTVT: 2
TZAT-0013SLOWVT: 2
TZAT-0013SLOWVT: 2
TZAT-0018FASTVT: NEGATIVE
TZAT-0018FASTVT: NEGATIVE
TZAT-0018SLOWVT: NEGATIVE
TZAT-0018SLOWVT: NEGATIVE
TZON-0003FASTVT: 300 ms
TZON-0003SLOWVT: 375 ms
TZST-0001FASTVT: 3
TZST-0001FASTVT: 4
TZST-0001FASTVT: 5
TZST-0001FASTVT: 6
TZST-0001FASTVT: 7
TZST-0001SLOWVT: 3
TZST-0001SLOWVT: 4
TZST-0001SLOWVT: 5
TZST-0001SLOWVT: 6
TZST-0001SLOWVT: 7
TZST-0003FASTVT: 21 J
TZST-0003FASTVT: 31 J
TZST-0003FASTVT: 31 J
TZST-0003FASTVT: 31 J
TZST-0003FASTVT: 31 J
TZST-0003SLOWVT: 21 J
TZST-0003SLOWVT: 31 J
TZST-0003SLOWVT: 31 J
TZST-0003SLOWVT: 31 J
TZST-0003SLOWVT: 31 J
VENTRICULAR PACING ICD: 99 pct

## 2011-08-09 NOTE — Assessment & Plan Note (Signed)
The etiology of her syncope is likely neurally mediated. They have reduced the dose of her beta blocker as her blood pressure is a bit on the low side. She'll take 25 mg twice daily.

## 2011-08-09 NOTE — Assessment & Plan Note (Signed)
Her device is working normally. Interrogation demonstrates no evidence of ventricular arrhythmias.

## 2011-08-09 NOTE — Patient Instructions (Signed)
Your physician wants you to follow-up in: 12 months with Dr Court Joy will receive a reminder letter in the mail two months in advance. If you don't receive a letter, please call our office to schedule the follow-up appointment.   Remote monitoring is used to monitor your Pacemaker of ICD from home. This monitoring reduces the number of office visits required to check your device to one time per year. It allows [place] to keep an eye on the functioning of your device to ensure it is working properly. You are scheduled for a device check from home on 11/15/2011. You may send your transmission at any time that day. If you have a wireless device, the transmission will be sent automatically. After your physician reviews your transmission, you will receive a postcard with your next transmission date.  Your physician has recommended you make the following change in your medication:  1) Decrease Metoprolol to 25mg  twice daily 1)
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
## 2011-08-24 ENCOUNTER — Other Ambulatory Visit (HOSPITAL_BASED_OUTPATIENT_CLINIC_OR_DEPARTMENT_OTHER): Payer: Medicare Other | Admitting: Lab

## 2011-08-24 DIAGNOSIS — C50419 Malignant neoplasm of upper-outer quadrant of unspecified female breast: Secondary | ICD-10-CM

## 2011-08-24 DIAGNOSIS — C50919 Malignant neoplasm of unspecified site of unspecified female breast: Secondary | ICD-10-CM

## 2011-08-24 DIAGNOSIS — E559 Vitamin D deficiency, unspecified: Secondary | ICD-10-CM

## 2011-08-24 LAB — CBC WITH DIFFERENTIAL/PLATELET
BASO%: 0.7 % (ref 0.0–2.0)
Basophils Absolute: 0.1 10*3/uL (ref 0.0–0.1)
EOS%: 3.8 % (ref 0.0–7.0)
Eosinophils Absolute: 0.3 10*3/uL (ref 0.0–0.5)
HCT: 37.5 % (ref 34.8–46.6)
HGB: 12.5 g/dL (ref 11.6–15.9)
LYMPH%: 28.9 % (ref 14.0–49.7)
MCH: 29 pg (ref 25.1–34.0)
MCHC: 33.3 g/dL (ref 31.5–36.0)
MCV: 87.1 fL (ref 79.5–101.0)
MONO#: 0.6 10*3/uL (ref 0.1–0.9)
MONO%: 7.4 % (ref 0.0–14.0)
NEUT#: 5.1 10*3/uL (ref 1.5–6.5)
NEUT%: 59.2 % (ref 38.4–76.8)
Platelets: 134 10*3/uL — ABNORMAL LOW (ref 145–400)
RBC: 4.3 10*6/uL (ref 3.70–5.45)
RDW: 15.6 % — ABNORMAL HIGH (ref 11.2–14.5)
WBC: 8.6 10*3/uL (ref 3.9–10.3)
lymph#: 2.5 10*3/uL (ref 0.9–3.3)
nRBC: 0 % (ref 0–0)

## 2011-08-25 LAB — COMPREHENSIVE METABOLIC PANEL
ALT: 17 U/L (ref 0–35)
AST: 19 U/L (ref 0–37)
Albumin: 4.1 g/dL (ref 3.5–5.2)
Alkaline Phosphatase: 73 U/L (ref 39–117)
BUN: 26 mg/dL — ABNORMAL HIGH (ref 6–23)
CO2: 26 mEq/L (ref 19–32)
Calcium: 9.8 mg/dL (ref 8.4–10.5)
Chloride: 107 mEq/L (ref 96–112)
Creatinine, Ser: 0.88 mg/dL (ref 0.50–1.10)
Glucose, Bld: 139 mg/dL — ABNORMAL HIGH (ref 70–99)
Potassium: 4 mEq/L (ref 3.5–5.3)
Sodium: 140 mEq/L (ref 135–145)
Total Bilirubin: 0.2 mg/dL — ABNORMAL LOW (ref 0.3–1.2)
Total Protein: 6.8 g/dL (ref 6.0–8.3)

## 2011-08-25 LAB — VITAMIN D 25 HYDROXY (VIT D DEFICIENCY, FRACTURES): Vit D, 25-Hydroxy: 48 ng/mL (ref 30–89)

## 2011-08-31 ENCOUNTER — Ambulatory Visit (HOSPITAL_BASED_OUTPATIENT_CLINIC_OR_DEPARTMENT_OTHER): Payer: Medicare Other | Admitting: Oncology

## 2011-08-31 ENCOUNTER — Telehealth: Payer: Self-pay | Admitting: *Deleted

## 2011-08-31 VITALS — BP 134/74 | HR 82 | Temp 98.1°F | Ht 60.0 in | Wt 138.8 lb

## 2011-08-31 DIAGNOSIS — Z853 Personal history of malignant neoplasm of breast: Secondary | ICD-10-CM

## 2011-08-31 DIAGNOSIS — E559 Vitamin D deficiency, unspecified: Secondary | ICD-10-CM

## 2011-08-31 NOTE — Telephone Encounter (Signed)
gave patient appointment for 08-29-2012 starting at 8:30am printed out calendar and gave to the patient

## 2011-08-31 NOTE — Progress Notes (Signed)
Hematology and Oncology Follow Up Visit  [name] [id number] 10/14/1933 78 y.o. 08/31/2011 9:44 AM   Principle Diagnosis: T1 C. N0 ER/PR negative breast cancer status post lumpectomy 02/11/2007 status post radiation completed 05/27/2007 2. History  of  Hypertension with  cardiac arrhythmia  and pacemaker  Interim History:  Is doing well. No intercurrent problems. Mammogram in September was normal.She had a mammogram and u/s at [facility] for an abnormaility in her lt breast. A biopsy performed in 3/13 was negative. This mass is getting smaller She has also had a syncopal episode which has not recurred. Her pacemaker was checked and is fine.  Medications: I have reviewed the patient's current medications.  Allergies:  Allergies  Allergen Reactions  . Shellfish Allergy Shortness Of Breath  . Aspirin Nausea And Vomiting  . Codeine Nausea And Vomiting    Past Medical History, Surgical history, Social history, and Family History were reviewed and updated.  Review of Systems: Constitutional:  Negative for fever, chills, night sweats, anorexia, weight loss, pain. Cardiovascular: no chest pain or dyspnea on exertion Respiratory: no cough, shortness of breath, or wheezing Neurological: no TIA or stroke symptoms Dermatological: negative ENT: negative Skin Gastrointestinal: no abdominal pain, change in bowel habits, or black or bloody stools Genito-Urinary: no dysuria, trouble voiding, or hematuria Hematological and Lymphatic: negative Breast: negative for breast lumps Musculoskeletal: negative Remaining ROS negative.  Physical Exam: Blood pressure 134/74, pulse 82, temperature 98.1 F (36.7 C), height 5' (1.524 m), weight 138 lb 12.8 oz (62.959 kg). ECOG: 0 General appearance: alert, cooperative and appears stated age Head: Normocephalic, without obvious abnormality, atraumatic Neck: no adenopathy, no carotid bruit, no JVD, supple, symmetrical, trachea midline and thyroid not  enlarged, symmetric, no tenderness/mass/nodules Lymph nodes: Cervical, supraclavicular, and axillary nodes normal. Cardiac : Pacemaker present left chest, heart sounds normal Pulmonary: Normal breath sounds  Breasts: Right and left breast normal no axillary adenopathy tender left axillary area, distorted area lt breast, with thickening likely scar tissue. Abdomen: Normal Extremities normal Neuro: Normal  Lab Results: Lab Results  Component Value Date   WBC 8.6 08/24/2011   HGB 12.5 08/24/2011   HCT 37.5 08/24/2011   MCV 87.1 08/24/2011   PLT 134* 08/24/2011     Chemistry      Component Value Date/Time   NA 140 08/24/2011 1332   K 4.0 08/24/2011 1332   CL 107 08/24/2011 1332   CO2 26 08/24/2011 1332   BUN 26* 08/24/2011 1332   CREATININE 0.88 08/24/2011 1332      Component Value Date/Time   CALCIUM 9.8 08/24/2011 1332   ALKPHOS 73 08/24/2011 1332   AST 19 08/24/2011 1332   ALT 17 08/24/2011 1332   BILITOT 0.2* 08/24/2011 1332     Diagnosis Breast, left, needle core biopsy - BENIGN BREAST PARENCHYMA WITH FAT NECROSIS. - NO ATYPIA, HYPERPLASIA OR MALIGNANCY IDENTIFIED.  Radiological Studies: chest X-ray NA Mammogram Recent September mammogram normal Bone density NA  Impression and Plan: She is doing well, she is 5 yr out from diagnosis, and i will see her in 1 yr.  for followup. She will continue the same regimen she will have a mammogram at [facility] in September.  More than 50% of the visit was spent in patient-related counselling   Pierce Crane, MD 5/24/20139:44 AM
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
## 2011-09-06 ENCOUNTER — Encounter (INDEPENDENT_AMBULATORY_CARE_PROVIDER_SITE_OTHER): Payer: Self-pay

## 2011-09-28 ENCOUNTER — Encounter (INDEPENDENT_AMBULATORY_CARE_PROVIDER_SITE_OTHER): Payer: Self-pay | Admitting: General Surgery

## 2011-09-28 ENCOUNTER — Ambulatory Visit (INDEPENDENT_AMBULATORY_CARE_PROVIDER_SITE_OTHER): Payer: Medicare Other | Admitting: General Surgery

## 2011-09-28 VITALS — BP 120/74 | HR 72 | Temp 97.6°F | Resp 14 | Ht 59.0 in | Wt 132.6 lb

## 2011-09-28 DIAGNOSIS — D1720 Benign lipomatous neoplasm of skin and subcutaneous tissue of unspecified limb: Secondary | ICD-10-CM

## 2011-09-28 DIAGNOSIS — D1739 Benign lipomatous neoplasm of skin and subcutaneous tissue of other sites: Secondary | ICD-10-CM

## 2011-09-28 NOTE — Assessment & Plan Note (Signed)
Given the fact that this mass has been enlarging, I would plan to excise it. The patient was advised that the incision would be at least 4 cm wide. I would place this in the skin lines. She was also advised that the risk of surgery include bleeding, infection, damage to adjacent structures, numbness, and a divot in the skin. There is a risk of seroma.  She is concerned about having surgery because when she had back surgery in the prone position she experienced cardiac arrest. This is a reasonable sized mass, but I will attempt to do this with MAC and local.  I have ordered an anesthesia consult for her to discuss these events with the anesthesia staff. If they feel that she is a poor operative candidate due to these events, they will let me know.

## 2011-09-28 NOTE — Patient Instructions (Signed)
Discuss prior code with anesthesia.

## 2011-09-28 NOTE — Progress Notes (Signed)
HISTORY: Patient is a 78 year old female have been following for breast cancer. She had left-sided breast conservation surgery with Dr. Cleon Gustin. She had significant pain from chronic seroma. We tried aspirating this multiple times and eventually did the surgical excision for the chronic seroma cavity.  The skin was freed up from the chest wall, however because of the radiation changes and inflammation, it adhered back to the chest wall.  The amount of discomfort that she was having has greatly diminished. She has a new left thigh mass. This feels like a lipoma but has been gradually enlarging over the past 6 months. It has gotten to be around 3 x 5 cm. It is at her upper inner thigh.  She is requesting removal due to the enlargement. This is not particularly sore.   PERTINENT REVIEW OF SYSTEMS: Otherwise negative.     EXAM: Head: Normocephalic and atraumatic.  Eyes:  Conjunctivae are normal. Pupils are equal, round, and reactive to light. No scleral icterus.  Neck:  Normal range of motion. Neck supple. No tracheal deviation present. No thyromegaly present.  Breast:  Right breast without masses, skin dimpling, nipple retraction, lymphadenopathy.  Left side with chronic changes that are stable.  Contour is distorted, and left lateral skin is very adherent to chest wall.   Resp: No respiratory distress, normal effort. Abd:  Abdomen is soft, non distended and non tender. No masses are palpable.  There is no rebound and no guarding.  Neurological: Alert and oriented to person, place, and time. Coordination normal.  Skin: Skin is warm and dry. No rash noted. No diaphoretic. No erythema. No pallor.  Psychiatric: Normal mood and affect. Normal behavior. Judgment and thought content normal.  Extremities:  Pt with obliquely oriented 3x5 cm mass in the upper inner thigh.  Soft, mobile.       ASSESSMENT AND PLAN:   Lipoma of left upper thigh 3x5 cm Given the fact that this mass has been enlarging, I  would plan to excise it. The patient was advised that the incision would be at least 4 cm wide. I would place this in the skin lines. She was also advised that the risk of surgery include bleeding, infection, damage to adjacent structures, numbness, and a divot in the skin. There is a risk of seroma.  She is concerned about having surgery because when she had back surgery in the prone position she experienced cardiac arrest. This is a reasonable sized mass, but I will attempt to do this with MAC and local.  I have ordered an anesthesia consult for her to discuss these events with the anesthesia staff. If they feel that she is a poor operative candidate due to these events, they will let me know.  Left breast cancer: Continue mammograms. Follow up after excision of above.      Maudry Diego, MD Surgical Oncology, General & Endocrine Surgery [facility]  Alva Garnet., MD Alva Garnet., MD

## 2011-10-22 ENCOUNTER — Telehealth: Payer: Self-pay | Admitting: Internal Medicine

## 2011-10-22 NOTE — Telephone Encounter (Signed)
Received her transmission and she had no episodes  She is going to follow up with her PCP if she continues to feel bad

## 2011-10-22 NOTE — Telephone Encounter (Signed)
Fu call °Pt calling back again °

## 2011-10-22 NOTE — Telephone Encounter (Signed)
Please return call to patient at hm#  Pt has a defib, c/o of dizziness and weakness at times, pt would like to be seen

## 2011-10-22 NOTE — Telephone Encounter (Signed)
Spoke with the patient and she was at church and she got dizzy had to have someone drive her home.  It lasted an hour.  She has been okay since then but has had a dry cough. She is going to send a remote transmission now and I will call he back after reviewing

## 2011-10-30 ENCOUNTER — Ambulatory Visit (HOSPITAL_BASED_OUTPATIENT_CLINIC_OR_DEPARTMENT_OTHER): Admit: 2011-10-30 | Payer: Self-pay | Admitting: General Surgery

## 2011-10-30 ENCOUNTER — Encounter (HOSPITAL_BASED_OUTPATIENT_CLINIC_OR_DEPARTMENT_OTHER): Payer: Self-pay

## 2011-10-30 ENCOUNTER — Encounter (HOSPITAL_COMMUNITY): Payer: Self-pay | Admitting: Pharmacy Technician

## 2011-10-30 SURGERY — EXCISION MASS
Anesthesia: Choice | Laterality: Left

## 2011-10-30 NOTE — Patient Instructions (Signed)
22 [name]  10/30/2011   Your procedure is scheduled on:  10/31/11 0730-0830am  Report to [facility] at 0530 AM.  Call this number if you have problems the morning of surgery: [phone]   Remember:   Do not eat food:After Midnight.  May have clear liquids:until Midnight .    Take these medicines the morning of surgery with A SIP OF WATER:    Do not wear jewelry  Do not wear lotions, powders, or perfumes.   Do not shave 48 hours prior to surgery.   Do not bring valuables to the hospital.  Contacts, dentures or bridgework may not be worn into surgery.     Patients discharged the day of surgery will not be allowed to drive home.  Name and phone number of your driver:   Special Instructions: CHG Shower Use Special Wash: 1/2 bottle night before surgery and 1/2 bottle morning of surgery. shower chin to toes with CHG.  Wash face and private parts with regular soap.    Please read over the following fact sheets that you were given: MRSA Information, coughing and deep breathing exercises , leg exercises

## 2011-10-31 ENCOUNTER — Ambulatory Visit (HOSPITAL_COMMUNITY)
Admission: RE | Admit: 2011-10-31 | Discharge: 2011-10-31 | Disposition: A | Discharge status: Home / Self Care | Payer: Medicare Other | Source: Ambulatory Visit | Attending: General Surgery | Admitting: General Surgery

## 2011-10-31 ENCOUNTER — Encounter (HOSPITAL_COMMUNITY)
Admission: RE | Admit: 2011-10-31 | Discharge: 2011-10-31 | Disposition: A | Discharge status: Home / Self Care | Payer: Medicare Other | Source: Ambulatory Visit | Attending: General Surgery | Admitting: General Surgery

## 2011-10-31 ENCOUNTER — Encounter (HOSPITAL_COMMUNITY): Payer: Self-pay

## 2011-10-31 DIAGNOSIS — Z01812 Encounter for preprocedural laboratory examination: Secondary | ICD-10-CM | POA: Insufficient documentation

## 2011-10-31 DIAGNOSIS — R229 Localized swelling, mass and lump, unspecified: Secondary | ICD-10-CM | POA: Insufficient documentation

## 2011-10-31 DIAGNOSIS — Z950 Presence of cardiac pacemaker: Secondary | ICD-10-CM | POA: Insufficient documentation

## 2011-10-31 HISTORY — DX: Hypothyroidism, unspecified: E03.9

## 2011-10-31 HISTORY — DX: Other complications of anesthesia, initial encounter: T88.59XA

## 2011-10-31 HISTORY — DX: Presence of automatic (implantable) cardiac defibrillator: Z95.810

## 2011-10-31 HISTORY — DX: Adverse effect of unspecified anesthetic, initial encounter: T41.45XA

## 2011-10-31 LAB — BASIC METABOLIC PANEL
BUN: 21 mg/dL (ref 6–23)
CO2: 29 mEq/L (ref 19–32)
Calcium: 10.8 mg/dL — ABNORMAL HIGH (ref 8.4–10.5)
Chloride: 103 mEq/L (ref 96–112)
Creatinine, Ser: 0.89 mg/dL (ref 0.50–1.10)
GFR calc Af Amer: 72 mL/min — ABNORMAL LOW (ref >90)
GFR calc non Af Amer: 62 mL/min — ABNORMAL LOW (ref >90)
Glucose, Bld: 92 mg/dL (ref 70–99)
Potassium: 3.9 mEq/L (ref 3.5–5.1)
Sodium: 140 mEq/L (ref 135–145)

## 2011-10-31 LAB — SURGICAL PCR SCREEN
MRSA, PCR: NEGATIVE
Staphylococcus aureus: POSITIVE — AB

## 2011-10-31 LAB — CBC
HCT: 38.4 % (ref 36.0–46.0)
Hemoglobin: 12.9 g/dL (ref 12.0–15.0)
MCH: 28.5 pg (ref 26.0–34.0)
MCHC: 33.6 g/dL (ref 30.0–36.0)
MCV: 84.8 fL (ref 78.0–100.0)
Platelets: 176 10*3/uL (ref 150–400)
RBC: 4.53 MIL/uL (ref 3.87–5.11)
RDW: 15.2 % (ref 11.5–15.5)
WBC: 9.9 10*3/uL (ref 4.0–10.5)

## 2011-10-31 NOTE — Progress Notes (Signed)
Last device clinic check and Dr Ladona Ridgel - 08/09/11 in [facility]  Faxed request to Dr Verdis Prime on 10/30/11 adn [facility] regarding ICD/PACER orders on 10/31/11.

## 2011-10-31 NOTE — Progress Notes (Signed)
EKG 04/12/11 chart  EKG 4/13 EPIC  04/12/11 LOV note DR Verdis Prime (cardiologist ) - chart  ECHO 01/03/10 chart STRESS Test 08/15/2005- chart

## 2011-11-01 ENCOUNTER — Encounter (HOSPITAL_COMMUNITY): Payer: Self-pay

## 2011-11-05 ENCOUNTER — Encounter (HOSPITAL_COMMUNITY): Payer: Self-pay | Admitting: *Deleted

## 2011-11-05 NOTE — Progress Notes (Signed)
11/05/11 Requested medical records from [facility] regarding back surgery in 2006. [Patient stated at preop appointment she " coded during surgery"    Received Discharge Summary, Op Report and Anesthesia Record.  According to Discharge Summary patient became hypotensive and seen by cardiologist.  No report of " coding " during surgery.   All reports on front of chart.

## 2011-11-07 NOTE — Anesthesia Preprocedure Evaluation (Signed)
Anesthesia Evaluation  Patient identified by MRN, date of birth, ID band Patient awake    Reviewed: Allergy & Precautions, H&P , NPO status , Patient's Chart, lab work & pertinent test results  Airway Mallampati: II TM Distance: >3 FB Neck ROM: Full    Dental No notable dental hx.    Pulmonary neg pulmonary ROS,  breath sounds clear to auscultation  Pulmonary exam normal       Cardiovascular hypertension, Pt. on medications and Pt. on home beta blockers +CHF + dysrhythmias + pacemaker + Cardiac Defibrillator Rhythm:Regular Rate:Normal     Neuro/Psych  Headaches, PSYCHIATRIC DISORDERS Depression    GI/Hepatic Neg liver ROS, GERD-  Medicated,  Endo/Other  Hypothyroidism   Renal/GU negative Renal ROS  negative genitourinary   Musculoskeletal negative musculoskeletal ROS (+)   Abdominal   Peds negative pediatric ROS (+)  Hematology negative hematology ROS (+)   Anesthesia Other Findings   Reproductive/Obstetrics negative OB ROS                           Anesthesia Physical Anesthesia Plan  ASA: III  Anesthesia Plan: General   Post-op Pain Management:    Induction: Intravenous  Airway Management Planned: LMA  Additional Equipment:   Intra-op Plan:   Post-operative Plan: Extubation in OR  Informed Consent: I have reviewed the patients History and Physical, chart, labs and discussed the procedure including the risks, benefits and alternatives for the proposed anesthesia with the patient or authorized representative who has indicated his/her understanding and acceptance.   Dental advisory given  Plan Discussed with: CRNA  Anesthesia Plan Comments:         Anesthesia Quick Evaluation

## 2011-11-08 ENCOUNTER — Encounter (HOSPITAL_COMMUNITY)
Admission: RE | Disposition: A | Discharge status: Home / Self Care | Payer: Self-pay | Source: Ambulatory Visit | Attending: General Surgery

## 2011-11-08 ENCOUNTER — Encounter (HOSPITAL_COMMUNITY): Payer: Self-pay | Admitting: *Deleted

## 2011-11-08 ENCOUNTER — Ambulatory Visit (HOSPITAL_COMMUNITY)
Admission: RE | Admit: 2011-11-08 | Discharge: 2011-11-08 | Disposition: A | Discharge status: Home / Self Care | Payer: Medicare Other | Source: Ambulatory Visit | Attending: General Surgery | Admitting: General Surgery

## 2011-11-08 ENCOUNTER — Encounter (HOSPITAL_COMMUNITY): Payer: Self-pay | Admitting: Anesthesiology

## 2011-11-08 ENCOUNTER — Ambulatory Visit (HOSPITAL_COMMUNITY): Payer: Medicare Other | Admitting: Anesthesiology

## 2011-11-08 ENCOUNTER — Encounter (HOSPITAL_COMMUNITY): Payer: Self-pay | Admitting: General Surgery

## 2011-11-08 DIAGNOSIS — I499 Cardiac arrhythmia, unspecified: Secondary | ICD-10-CM | POA: Insufficient documentation

## 2011-11-08 DIAGNOSIS — E039 Hypothyroidism, unspecified: Secondary | ICD-10-CM | POA: Insufficient documentation

## 2011-11-08 DIAGNOSIS — I10 Essential (primary) hypertension: Secondary | ICD-10-CM | POA: Insufficient documentation

## 2011-11-08 DIAGNOSIS — Z950 Presence of cardiac pacemaker: Secondary | ICD-10-CM | POA: Insufficient documentation

## 2011-11-08 DIAGNOSIS — D1739 Benign lipomatous neoplasm of skin and subcutaneous tissue of other sites: Secondary | ICD-10-CM

## 2011-11-08 DIAGNOSIS — K219 Gastro-esophageal reflux disease without esophagitis: Secondary | ICD-10-CM | POA: Insufficient documentation

## 2011-11-08 DIAGNOSIS — C50919 Malignant neoplasm of unspecified site of unspecified female breast: Secondary | ICD-10-CM | POA: Insufficient documentation

## 2011-11-08 DIAGNOSIS — I509 Heart failure, unspecified: Secondary | ICD-10-CM | POA: Insufficient documentation

## 2011-11-08 HISTORY — PX: MASS EXCISION: SHX2000

## 2011-11-08 SURGERY — EXCISION MASS
Anesthesia: General | Site: Thigh | Laterality: Left | Wound class: Clean

## 2011-11-08 MED ORDER — CEFAZOLIN SODIUM-DEXTROSE 2-3 GM-% IV SOLR
INTRAVENOUS | Status: AC
  Filled 2011-11-08: qty 50

## 2011-11-08 MED ORDER — LACTATED RINGERS IV SOLN
INTRAVENOUS | Status: DC | PRN
  Administered 2011-11-08: 07:00:00 via INTRAVENOUS

## 2011-11-08 MED ORDER — CHLORHEXIDINE GLUCONATE 4 % EX LIQD: 1.0000 "application " | Freq: Once | CUTANEOUS | Status: DC

## 2011-11-08 MED ORDER — ONDANSETRON HCL 4 MG/2ML IJ SOLN
INTRAMUSCULAR | Status: DC | PRN
  Administered 2011-11-08: 4 mg via INTRAVENOUS

## 2011-11-08 MED ORDER — ACETAMINOPHEN 10 MG/ML IV SOLN
INTRAVENOUS | Status: AC
  Filled 2011-11-08: qty 100

## 2011-11-08 MED ORDER — CEFAZOLIN SODIUM-DEXTROSE 2-3 GM-% IV SOLR
2.0000 g | INTRAVENOUS | Status: AC
  Administered 2011-11-08: 2 g via INTRAVENOUS

## 2011-11-08 MED ORDER — EPHEDRINE SULFATE 50 MG/ML IJ SOLN
INTRAMUSCULAR | Status: DC | PRN
  Administered 2011-11-08 (×4): 10 mg via INTRAVENOUS

## 2011-11-08 MED ORDER — LIDOCAINE HCL 1 % IJ SOLN
INTRAMUSCULAR | Status: AC
  Filled 2011-11-08: qty 20

## 2011-11-08 MED ORDER — PROPOFOL 10 MG/ML IV EMUL
INTRAVENOUS | Status: DC | PRN
  Administered 2011-11-08: 200 mg via INTRAVENOUS

## 2011-11-08 MED ORDER — DEXAMETHASONE SODIUM PHOSPHATE 10 MG/ML IJ SOLN
INTRAMUSCULAR | Status: DC | PRN
  Administered 2011-11-08: 10 mg via INTRAVENOUS

## 2011-11-08 MED ORDER — BUPIVACAINE-EPINEPHRINE 0.25% -1:200000 IJ SOLN
INTRAMUSCULAR | Status: AC
  Filled 2011-11-08: qty 1

## 2011-11-08 MED ORDER — HYDROCODONE-ACETAMINOPHEN 5-325 MG PO TABS: 1.0000 | ORAL_TABLET | Freq: Four times a day (QID) | ORAL | Status: AC | PRN

## 2011-11-08 MED ORDER — LIDOCAINE HCL (PF) 1 % IJ SOLN
INTRAMUSCULAR | Status: DC | PRN
  Administered 2011-11-08: 7.5 mL via SUBCUTANEOUS

## 2011-11-08 MED ORDER — ACETAMINOPHEN 10 MG/ML IV SOLN
INTRAVENOUS | Status: DC | PRN
  Administered 2011-11-08: 1000 mg via INTRAVENOUS

## 2011-11-08 MED ORDER — LIDOCAINE HCL (CARDIAC) 20 MG/ML IV SOLN
INTRAVENOUS | Status: DC | PRN
  Administered 2011-11-08: 70 mg via INTRAVENOUS

## 2011-11-08 MED ORDER — BUPIVACAINE-EPINEPHRINE 0.25% -1:200000 IJ SOLN
INTRAMUSCULAR | Status: DC | PRN
  Administered 2011-11-08: 7.5 mL

## 2011-11-08 MED ORDER — FENTANYL CITRATE 0.05 MG/ML IJ SOLN
INTRAMUSCULAR | Status: DC | PRN
  Administered 2011-11-08 (×2): 25 ug via INTRAVENOUS

## 2011-11-08 SURGICAL SUPPLY — 44 items
BENZOIN TINCTURE PRP APPL 2/3 (GAUZE/BANDAGES/DRESSINGS) ×2 IMPLANT
BLADE SURG 10 STRL SS (BLADE) ×2 IMPLANT
BLADE SURG 15 STRL LF DISP TIS (BLADE) ×1 IMPLANT
BLADE SURG 15 STRL SS (BLADE) ×1
CANISTER SUCTION 2500CC (MISCELLANEOUS) ×2 IMPLANT
CHLORAPREP W/TINT 26ML (MISCELLANEOUS) ×2 IMPLANT
CLOTH BEACON ORANGE TIMEOUT ST (SAFETY) ×2 IMPLANT
COVER SURGICAL LIGHT HANDLE (MISCELLANEOUS) ×2 IMPLANT
DERMABOND ADVANCED (GAUZE/BANDAGES/DRESSINGS)
DERMABOND ADVANCED .7 DNX12 (GAUZE/BANDAGES/DRESSINGS) IMPLANT
DRAPE LAPAROSCOPIC ABDOMINAL (DRAPES) IMPLANT
DRAPE PED LAPAROTOMY (DRAPES) IMPLANT
DRSG TEGADERM 4X4.75 (GAUZE/BANDAGES/DRESSINGS) IMPLANT
ELECT CAUTERY BLADE 6.4 (BLADE) ×2 IMPLANT
ELECT REM PT RETURN 9FT ADLT (ELECTROSURGICAL) ×2
ELECTRODE REM PT RTRN 9FT ADLT (ELECTROSURGICAL) ×1 IMPLANT
GAUZE SPONGE 4X4 16PLY XRAY LF (GAUZE/BANDAGES/DRESSINGS) ×2 IMPLANT
GLOVE BIO SURGEON STRL SZ 6 (GLOVE) ×2 IMPLANT
GLOVE BIOGEL PI IND STRL 6.5 (GLOVE) ×1 IMPLANT
GLOVE BIOGEL PI INDICATOR 6.5 (GLOVE) ×1
GOWN PREVENTION PLUS XXLARGE (GOWN DISPOSABLE) ×4 IMPLANT
GOWN STRL NON-REIN LRG LVL3 (GOWN DISPOSABLE) ×4 IMPLANT
KIT BASIN OR (CUSTOM PROCEDURE TRAY) ×2 IMPLANT
KIT ROOM TURNOVER OR (KITS) ×2 IMPLANT
NEEDLE HYPO 25GX1X1/2 BEV (NEEDLE) ×2 IMPLANT
NS IRRIG 1000ML POUR BTL (IV SOLUTION) ×2 IMPLANT
PACK GENERAL/GYN (CUSTOM PROCEDURE TRAY) ×2 IMPLANT
PACK SURGICAL SETUP 50X90 (CUSTOM PROCEDURE TRAY) ×2 IMPLANT
PAD ARMBOARD 7.5X6 YLW CONV (MISCELLANEOUS) ×4 IMPLANT
PENCIL BUTTON HOLSTER BLD 10FT (ELECTRODE) ×2 IMPLANT
SPECIMEN JAR SMALL (MISCELLANEOUS) ×2 IMPLANT
SPONGE GAUZE 4X4 12PLY (GAUZE/BANDAGES/DRESSINGS) ×2 IMPLANT
STRIP CLOSURE SKIN 1/2X4 (GAUZE/BANDAGES/DRESSINGS) ×2 IMPLANT
SUT MON AB 4-0 PC3 18 (SUTURE) ×2 IMPLANT
SUT SILK 2 0 FS (SUTURE) IMPLANT
SUT VIC AB 3-0 SH 27 (SUTURE) ×1
SUT VIC AB 3-0 SH 27X BRD (SUTURE) ×1 IMPLANT
SYR BULB 3OZ (MISCELLANEOUS) ×2 IMPLANT
SYR CONTROL 10ML LL (SYRINGE) ×2 IMPLANT
TOWEL OR 17X24 6PK STRL BLUE (TOWEL DISPOSABLE) ×2 IMPLANT
TOWEL OR 17X26 10 PK STRL BLUE (TOWEL DISPOSABLE) ×2 IMPLANT
TUBE CONNECTING 12X1/4 (SUCTIONS) IMPLANT
WATER STERILE IRR 1000ML POUR (IV SOLUTION) IMPLANT
YANKAUER SUCT BULB TIP NO VENT (SUCTIONS) IMPLANT

## 2011-11-08 NOTE — Transfer of Care (Addendum)
Immediate Anesthesia Transfer of Care Note  Patient: [name]  Procedure(s) Performed: Procedure(s) (LRB): EXCISION MASS (Left)  Patient Location: PACU  Anesthesia Type: General  Level of Consciousness: sedated, patient cooperative and responds to stimulaton  Airway & Oxygen Therapy: Patient Spontanous Breathing and Patient connected to face mask oxgen  Post-op Assessment: Report given to PACU RN and Post -op Vital signs reviewed and stable  Post vital signs: Reviewed and stable  Complications: No apparent anesthesia complications

## 2011-11-08 NOTE — Preoperative (Addendum)
Beta Blockers   Reason not to administer Beta Blockers:Not Applicable Pt took Beta Blocker 11-08-11 AM

## 2011-11-08 NOTE — Op Note (Signed)
PRE-OPERATIVE DIAGNOSIS: left upper thigh lipoma  POST-OPERATIVE DIAGNOSIS:  Same  PROCEDURE:  Procedure(s): Excision of left upper thigh lipoma, subcutaneous 3x5 cm  SURGEON:  Surgeon(s): Almond Lint, MD  ANESTHESIA:   general  DRAINS: none   LOCAL MEDICATIONS USED:  MARCAINE    and XYLOCAINE   SPECIMEN:  Source of Specimen:  left thigh mass  DISPOSITION OF SPECIMEN:  PATHOLOGY  COUNTS:  YES  PLAN OF CARE: Discharge to home after PACU  PATIENT DISPOSITION:  PACU - hemodynamically stable.    PROCEDURE:   Pt was identified in the holding area and taken to the OR where she was placed supine on the operating room table.  General anesthesia was induced.  She was placed into the frog leg position and the upper inner thigh was prepped and draped in sterile fashion.  Timeout was performed according to the surgical safety checklist.    The skin over the mass was anesthetized.  A longitudinal incision was made over the tumor.  The skin was elevated with skin hooks, and flaps were created around the mass.  The mass was taken off the fascia, avoiding the saphenous vein.  The mass was clinically a lipoma.  The cautery was used to resect the mass.  The cavity was irrigated.  The skin was reapproximated with 3-0 vicryl deep dermal interrupted sutures and 4-0 running monocryl subcuticular suture.  The wound was cleaned, dried, and dressed with dermabond, gauze, and coban.
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
## 2011-11-08 NOTE — Anesthesia Postprocedure Evaluation (Signed)
  Anesthesia Post-op Note  Patient: [name]  Procedure(s) Performed: Procedure(s) (LRB): EXCISION MASS (Left)  Patient Location: PACU  Anesthesia Type: General  Level of Consciousness: awake and alert   Airway and Oxygen Therapy: Patient Spontanous Breathing  Post-op Pain: mild  Post-op Assessment: Post-op Vital signs reviewed, Patient's Cardiovascular Status Stable, Respiratory Function Stable, Patent Airway and No signs of Nausea or vomiting  Post-op Vital Signs: stable  Complications: No apparent anesthesia complications. Pacemaker working fine. No magnet used.

## 2011-11-09 ENCOUNTER — Encounter (HOSPITAL_COMMUNITY): Payer: Self-pay | Admitting: General Surgery

## 2011-11-15 ENCOUNTER — Encounter: Payer: Self-pay | Admitting: Internal Medicine

## 2011-11-15 ENCOUNTER — Ambulatory Visit (INDEPENDENT_AMBULATORY_CARE_PROVIDER_SITE_OTHER): Payer: Medicare Other | Admitting: *Deleted

## 2011-11-15 DIAGNOSIS — I428 Other cardiomyopathies: Secondary | ICD-10-CM

## 2011-11-18 LAB — REMOTE ICD DEVICE
AL AMPLITUDE: 5 mv
AL IMPEDENCE ICD: 402 Ohm
ATRIAL PACING ICD: 0 pct
BATTERY VOLTAGE: 2.56 V
BRDY-0002RA: 55 {beats}/min
BRDY-0003RA: 120 {beats}/min
CHARGE TIME: 9.5 s
DEV-0020ICD: NEGATIVE
DEVICE MODEL ICD: 215054
FVT: 0
HV IMPEDENCE: 42 Ohm
MODE SWITCH EPISODES: 0
PACEART VT: 0
TOT-0006: 20130502000000
TZAT-0001FASTVT: 1
TZAT-0001FASTVT: 2
TZAT-0001SLOWVT: 1
TZAT-0001SLOWVT: 2
TZAT-0002FASTVT: NEGATIVE
TZAT-0013FASTVT: 2
TZAT-0013SLOWVT: 2
TZAT-0013SLOWVT: 2
TZAT-0018FASTVT: NEGATIVE
TZAT-0018FASTVT: NEGATIVE
TZAT-0018SLOWVT: NEGATIVE
TZAT-0018SLOWVT: NEGATIVE
TZON-0003FASTVT: 300 ms
TZON-0003SLOWVT: 375 ms
TZST-0001FASTVT: 3
TZST-0001FASTVT: 4
TZST-0001FASTVT: 5
TZST-0001FASTVT: 6
TZST-0001FASTVT: 7
TZST-0001SLOWVT: 3
TZST-0001SLOWVT: 4
TZST-0001SLOWVT: 5
TZST-0001SLOWVT: 6
TZST-0001SLOWVT: 7
TZST-0003FASTVT: 21 J
TZST-0003FASTVT: 31 J
TZST-0003FASTVT: 31 J
TZST-0003FASTVT: 31 J
TZST-0003FASTVT: 31 J
TZST-0003SLOWVT: 21 J
TZST-0003SLOWVT: 31 J
TZST-0003SLOWVT: 31 J
TZST-0003SLOWVT: 31 J
TZST-0003SLOWVT: 31 J
VENTRICULAR PACING ICD: 98 pct
VF: 0

## 2011-12-03 ENCOUNTER — Ambulatory Visit (INDEPENDENT_AMBULATORY_CARE_PROVIDER_SITE_OTHER): Payer: Medicare Other | Admitting: General Surgery

## 2011-12-03 ENCOUNTER — Encounter (INDEPENDENT_AMBULATORY_CARE_PROVIDER_SITE_OTHER): Payer: Self-pay | Admitting: General Surgery

## 2011-12-03 VITALS — BP 122/72 | HR 68 | Temp 97.1°F | Resp 16 | Ht 60.0 in | Wt 132.8 lb

## 2011-12-03 DIAGNOSIS — D1720 Benign lipomatous neoplasm of skin and subcutaneous tissue of unspecified limb: Secondary | ICD-10-CM

## 2011-12-03 DIAGNOSIS — D1739 Benign lipomatous neoplasm of skin and subcutaneous tissue of other sites: Secondary | ICD-10-CM

## 2011-12-03 NOTE — Patient Instructions (Signed)
Follow up in 1 week

## 2011-12-03 NOTE — Assessment & Plan Note (Signed)
Pt with post op seroma. Greater than 270 mL aspirated.  Follow up in 1 week.

## 2011-12-03 NOTE — Progress Notes (Signed)
HISTORY: Pt having some pain/swelling after surgery.  She denies fever/chills.  She states that this is more soreness than frank pain.     EXAM: General:  Alert and oriented Incision:  Very large seroma.  Small amount of faint erythema.  Scabbing on central incision.     PATHOLOGY: lipoma   ASSESSMENT AND PLAN:   Lipoma of left upper thigh 3x5 cm Pt with post op seroma. Greater than 270 mL aspirated.  Follow up in 1 week.        Maudry Diego, MD Surgical Oncology, General & Endocrine Surgery [facility]  Alva Garnet., MD Alva Garnet., MD

## 2011-12-07 ENCOUNTER — Encounter (HOSPITAL_COMMUNITY): Payer: Self-pay | Admitting: *Deleted

## 2011-12-07 ENCOUNTER — Emergency Department (HOSPITAL_COMMUNITY)
Admission: EM | Admit: 2011-12-07 | Discharge: 2011-12-07 | Disposition: A | Discharge status: Home / Self Care | Payer: Medicare Other | Attending: Emergency Medicine | Admitting: Emergency Medicine

## 2011-12-07 ENCOUNTER — Ambulatory Visit (INDEPENDENT_AMBULATORY_CARE_PROVIDER_SITE_OTHER): Payer: Medicare Other | Admitting: General Surgery

## 2011-12-07 VITALS — BP 122/80 | HR 84 | Resp 18 | Ht 60.0 in | Wt 129.0 lb

## 2011-12-07 DIAGNOSIS — F3289 Other specified depressive episodes: Secondary | ICD-10-CM | POA: Insufficient documentation

## 2011-12-07 DIAGNOSIS — I5020 Unspecified systolic (congestive) heart failure: Secondary | ICD-10-CM | POA: Insufficient documentation

## 2011-12-07 DIAGNOSIS — Z886 Allergy status to analgesic agent status: Secondary | ICD-10-CM | POA: Insufficient documentation

## 2011-12-07 DIAGNOSIS — F329 Major depressive disorder, single episode, unspecified: Secondary | ICD-10-CM | POA: Insufficient documentation

## 2011-12-07 DIAGNOSIS — D1739 Benign lipomatous neoplasm of skin and subcutaneous tissue of other sites: Secondary | ICD-10-CM

## 2011-12-07 DIAGNOSIS — E039 Hypothyroidism, unspecified: Secondary | ICD-10-CM | POA: Insufficient documentation

## 2011-12-07 DIAGNOSIS — Z853 Personal history of malignant neoplasm of breast: Secondary | ICD-10-CM | POA: Insufficient documentation

## 2011-12-07 DIAGNOSIS — E785 Hyperlipidemia, unspecified: Secondary | ICD-10-CM | POA: Insufficient documentation

## 2011-12-07 DIAGNOSIS — I509 Heart failure, unspecified: Secondary | ICD-10-CM | POA: Insufficient documentation

## 2011-12-07 DIAGNOSIS — IMO0002 Reserved for concepts with insufficient information to code with codable children: Secondary | ICD-10-CM

## 2011-12-07 DIAGNOSIS — Z79899 Other long term (current) drug therapy: Secondary | ICD-10-CM | POA: Insufficient documentation

## 2011-12-07 DIAGNOSIS — Y92009 Unspecified place in unspecified non-institutional (private) residence as the place of occurrence of the external cause: Secondary | ICD-10-CM | POA: Insufficient documentation

## 2011-12-07 DIAGNOSIS — T25229A Burn of second degree of unspecified foot, initial encounter: Secondary | ICD-10-CM | POA: Insufficient documentation

## 2011-12-07 DIAGNOSIS — I10 Essential (primary) hypertension: Secondary | ICD-10-CM | POA: Insufficient documentation

## 2011-12-07 DIAGNOSIS — X19XXXA Contact with other heat and hot substances, initial encounter: Secondary | ICD-10-CM | POA: Insufficient documentation

## 2011-12-07 DIAGNOSIS — D1720 Benign lipomatous neoplasm of skin and subcutaneous tissue of unspecified limb: Secondary | ICD-10-CM

## 2011-12-07 DIAGNOSIS — Z91013 Allergy to seafood: Secondary | ICD-10-CM | POA: Insufficient documentation

## 2011-12-07 DIAGNOSIS — K219 Gastro-esophageal reflux disease without esophagitis: Secondary | ICD-10-CM | POA: Insufficient documentation

## 2011-12-07 MED ORDER — CEPHALEXIN 500 MG PO CAPS: 500.0000 mg | ORAL_CAPSULE | Freq: Three times a day (TID) | ORAL | Status: DC

## 2011-12-07 MED ORDER — SILVER SULFADIAZINE 1 % EX CREA
TOPICAL_CREAM | Freq: Two times a day (BID) | CUTANEOUS | Status: DC
  Administered 2011-12-07: 21:00:00 via TOPICAL
  Filled 2011-12-07: qty 50

## 2011-12-07 NOTE — Patient Instructions (Signed)
Take antibiotic prescription.  Follow up on Tuesday.

## 2011-12-07 NOTE — ED Notes (Signed)
Pt dropped mashed potatoes on her left foot; red with blistering to top of foot

## 2011-12-07 NOTE — Assessment & Plan Note (Signed)
Pt with rapid recurrence of seroma. Re-aspirated for 100 mL Follow up Tuesday. Keflex for some mild erythema.

## 2011-12-07 NOTE — Progress Notes (Signed)
HISTORY: Pt with recurrence of seroma.  She is also having some redness    EXAM: General:  Alert and oriented. Incision:  Some redness and some eschar in the center of incision.  Recurrent seroma.     PATHOLOGY: lipoma   ASSESSMENT AND PLAN:   Lipoma of left upper thigh 3x5 cm Pt with rapid recurrence of seroma. Re-aspirated for 100 mL Follow up Tuesday. Keflex for some mild erythema.        Maudry Diego, MD Surgical Oncology, General & Endocrine Surgery [facility]  Alva Garnet., MD Alva Garnet., MD

## 2011-12-08 NOTE — ED Provider Notes (Signed)
Medical screening examination/treatment/procedure(s) were performed by non-physician practitioner and as supervising physician I was immediately available for consultation/collaboration.  Gerhard Munch, MD 12/08/11 [phone]
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
## 2011-12-11 ENCOUNTER — Encounter (INDEPENDENT_AMBULATORY_CARE_PROVIDER_SITE_OTHER): Payer: Self-pay | Admitting: General Surgery

## 2011-12-11 ENCOUNTER — Ambulatory Visit (INDEPENDENT_AMBULATORY_CARE_PROVIDER_SITE_OTHER): Payer: Medicare Other | Admitting: General Surgery

## 2011-12-11 VITALS — BP 128/70 | HR 64 | Temp 96.0°F | Resp 18 | Ht 60.0 in | Wt 130.6 lb

## 2011-12-11 DIAGNOSIS — T25229A Burn of second degree of unspecified foot, initial encounter: Secondary | ICD-10-CM

## 2011-12-11 DIAGNOSIS — T25222A Burn of second degree of left foot, initial encounter: Secondary | ICD-10-CM

## 2011-12-11 DIAGNOSIS — D1720 Benign lipomatous neoplasm of skin and subcutaneous tissue of unspecified limb: Secondary | ICD-10-CM

## 2011-12-11 DIAGNOSIS — D1739 Benign lipomatous neoplasm of skin and subcutaneous tissue of other sites: Secondary | ICD-10-CM

## 2011-12-11 MED ORDER — CEPHALEXIN 500 MG PO CAPS: 500.0000 mg | ORAL_CAPSULE | Freq: Four times a day (QID) | ORAL | Status: AC
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
## 2011-12-11 NOTE — Patient Instructions (Addendum)
Get follow up in burn clinic.  Follow up with me in 1 week.  Take keflex (? Already have at home)

## 2011-12-12 ENCOUNTER — Telehealth (INDEPENDENT_AMBULATORY_CARE_PROVIDER_SITE_OTHER): Payer: Self-pay

## 2011-12-12 NOTE — Telephone Encounter (Signed)
Pt has appt with Wayland Denis on 12/17/11 at 9:00am.  Notes to be faxed.
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
## 2011-12-16 NOTE — Assessment & Plan Note (Signed)
Reaspirated with local 80 mL serous fluid.

## 2011-12-17 ENCOUNTER — Ambulatory Visit (INDEPENDENT_AMBULATORY_CARE_PROVIDER_SITE_OTHER): Payer: Medicare Other | Admitting: General Surgery

## 2011-12-17 ENCOUNTER — Encounter (HOSPITAL_BASED_OUTPATIENT_CLINIC_OR_DEPARTMENT_OTHER): Payer: Medicare Other | Attending: Plastic Surgery

## 2011-12-17 ENCOUNTER — Encounter (INDEPENDENT_AMBULATORY_CARE_PROVIDER_SITE_OTHER): Payer: Self-pay | Admitting: General Surgery

## 2011-12-17 VITALS — BP 134/82 | HR 70 | Temp 97.8°F | Resp 18 | Ht 60.0 in | Wt 131.6 lb

## 2011-12-17 DIAGNOSIS — D1739 Benign lipomatous neoplasm of skin and subcutaneous tissue of other sites: Secondary | ICD-10-CM

## 2011-12-17 DIAGNOSIS — E039 Hypothyroidism, unspecified: Secondary | ICD-10-CM | POA: Insufficient documentation

## 2011-12-17 DIAGNOSIS — Z79899 Other long term (current) drug therapy: Secondary | ICD-10-CM | POA: Insufficient documentation

## 2011-12-17 DIAGNOSIS — Z853 Personal history of malignant neoplasm of breast: Secondary | ICD-10-CM | POA: Insufficient documentation

## 2011-12-17 DIAGNOSIS — D1720 Benign lipomatous neoplasm of skin and subcutaneous tissue of unspecified limb: Secondary | ICD-10-CM

## 2011-12-17 DIAGNOSIS — E785 Hyperlipidemia, unspecified: Secondary | ICD-10-CM | POA: Insufficient documentation

## 2011-12-17 DIAGNOSIS — Z95810 Presence of automatic (implantable) cardiac defibrillator: Secondary | ICD-10-CM | POA: Insufficient documentation

## 2011-12-17 DIAGNOSIS — T25229A Burn of second degree of unspecified foot, initial encounter: Secondary | ICD-10-CM

## 2011-12-17 DIAGNOSIS — I10 Essential (primary) hypertension: Secondary | ICD-10-CM | POA: Insufficient documentation

## 2011-12-17 DIAGNOSIS — K219 Gastro-esophageal reflux disease without esophagitis: Secondary | ICD-10-CM | POA: Insufficient documentation

## 2011-12-17 DIAGNOSIS — X088XXA Exposure to other specified smoke, fire and flames, initial encounter: Secondary | ICD-10-CM | POA: Insufficient documentation

## 2011-12-17 DIAGNOSIS — Z96659 Presence of unspecified artificial knee joint: Secondary | ICD-10-CM | POA: Insufficient documentation

## 2011-12-17 DIAGNOSIS — I2589 Other forms of chronic ischemic heart disease: Secondary | ICD-10-CM | POA: Insufficient documentation

## 2011-12-17 DIAGNOSIS — T25222A Burn of second degree of left foot, initial encounter: Secondary | ICD-10-CM

## 2011-12-17 DIAGNOSIS — T25329A Burn of third degree of unspecified foot, initial encounter: Secondary | ICD-10-CM | POA: Insufficient documentation

## 2011-12-17 NOTE — Assessment & Plan Note (Signed)
Aspirated for 50 mL.

## 2011-12-17 NOTE — Patient Instructions (Signed)
Elevate leg when not up and about.  Follow up in 1 week.
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
## 2011-12-17 NOTE — Assessment & Plan Note (Signed)
Seeing Dr. Kelly Splinter at wound clinic.
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
## 2011-12-21 ENCOUNTER — Encounter (INDEPENDENT_AMBULATORY_CARE_PROVIDER_SITE_OTHER): Payer: Self-pay | Admitting: General Surgery

## 2011-12-21 ENCOUNTER — Ambulatory Visit (INDEPENDENT_AMBULATORY_CARE_PROVIDER_SITE_OTHER): Payer: Medicare Other | Admitting: General Surgery

## 2011-12-21 VITALS — BP 94/66 | HR 64 | Temp 97.8°F | Resp 14 | Ht 60.0 in | Wt 131.2 lb

## 2011-12-21 DIAGNOSIS — D1720 Benign lipomatous neoplasm of skin and subcutaneous tissue of unspecified limb: Secondary | ICD-10-CM

## 2011-12-21 DIAGNOSIS — D1739 Benign lipomatous neoplasm of skin and subcutaneous tissue of other sites: Secondary | ICD-10-CM

## 2011-12-21 NOTE — Patient Instructions (Signed)
Follow-up in 2 weeks

## 2011-12-27 NOTE — Progress Notes (Signed)
HISTORY: Having some foot swelling.  Keeping it elevated while not walking.  Some recurrent seroma present in groin.       EXAM: General:  Alert and oriented Incision:  Small left groin seroma.  Much smaller since last visit.  No redness.  Eschar in center of wound smaller.  Aspirated.    PATHOLOGY: lipoma   ASSESSMENT AND PLAN:   Lipoma of left upper thigh 3x5 cm Recurrent seroma Aspirated for 40 mL Follow up in 2 weeks.         Maudry Diego, MD Surgical Oncology, General & Endocrine Surgery [facility]  Alva Garnet., MD Alva Garnet., MD

## 2011-12-27 NOTE — Assessment & Plan Note (Signed)
Recurrent seroma Aspirated for 40 mL Follow up in 2 weeks.

## 2011-12-28 ENCOUNTER — Telehealth: Payer: Self-pay | Admitting: Internal Medicine

## 2011-12-28 NOTE — Telephone Encounter (Signed)
Spoke with patient and let her know that her Aug transmission was good and that she is not due until Nov  She was ok and was just worried because she thought it was to do it every month

## 2011-12-28 NOTE — Telephone Encounter (Signed)
New Problem:    Patient called in because she believes that he device has not been read by her wireless transmission device since 10/22/11 and it stated in her manual to call her doctor if anything went wrong.  Also patient had a dizzy spell and would like to know if it had anything to do with her heart.  Please call back.
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
## 2012-01-09 ENCOUNTER — Encounter (INDEPENDENT_AMBULATORY_CARE_PROVIDER_SITE_OTHER): Payer: Self-pay | Admitting: General Surgery

## 2012-01-09 ENCOUNTER — Ambulatory Visit (INDEPENDENT_AMBULATORY_CARE_PROVIDER_SITE_OTHER): Payer: Medicare Other | Admitting: General Surgery

## 2012-01-09 VITALS — BP 146/82 | HR 77 | Temp 97.1°F | Ht 60.0 in | Wt 132.0 lb

## 2012-01-09 DIAGNOSIS — D1739 Benign lipomatous neoplasm of skin and subcutaneous tissue of other sites: Secondary | ICD-10-CM

## 2012-01-09 DIAGNOSIS — D1720 Benign lipomatous neoplasm of skin and subcutaneous tissue of unspecified limb: Secondary | ICD-10-CM

## 2012-01-09 NOTE — Patient Instructions (Signed)
Follow up in 3 ish weeks.  Call if issues arise before then.

## 2012-01-10 NOTE — Assessment & Plan Note (Signed)
Minimal seroma present.  Some hematoma.   Follow up in 3 weeks.

## 2012-01-10 NOTE — Progress Notes (Signed)
HISTORY: Minimal reaccumulation of seroma.  Pain much less.       EXAM: General:  Alert and oriented Incision:  Tiny left groin seroma.  Wound eschar has shrunk as well.  Some firmness from hematoma.    PATHOLOGY: lipoma   ASSESSMENT AND PLAN:   Lipoma of left upper thigh 3x5 cm Minimal seroma present.  Some hematoma.   Follow up in 3 weeks.       Maudry Diego, MD Surgical Oncology, General & Endocrine Surgery [facility]  Alva Garnet., MD Alva Garnet., MD

## 2012-01-14 ENCOUNTER — Encounter (HOSPITAL_BASED_OUTPATIENT_CLINIC_OR_DEPARTMENT_OTHER): Payer: Medicare Other | Attending: Plastic Surgery

## 2012-01-14 DIAGNOSIS — X088XXA Exposure to other specified smoke, fire and flames, initial encounter: Secondary | ICD-10-CM | POA: Insufficient documentation

## 2012-01-14 DIAGNOSIS — L97509 Non-pressure chronic ulcer of other part of unspecified foot with unspecified severity: Secondary | ICD-10-CM | POA: Insufficient documentation

## 2012-01-14 DIAGNOSIS — T25329A Burn of third degree of unspecified foot, initial encounter: Secondary | ICD-10-CM | POA: Insufficient documentation
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
## 2012-01-21 ENCOUNTER — Encounter (HOSPITAL_BASED_OUTPATIENT_CLINIC_OR_DEPARTMENT_OTHER): Payer: Medicare Other

## 2012-02-04 ENCOUNTER — Encounter (INDEPENDENT_AMBULATORY_CARE_PROVIDER_SITE_OTHER): Payer: Medicare Other | Admitting: General Surgery

## 2012-02-11 ENCOUNTER — Encounter (HOSPITAL_BASED_OUTPATIENT_CLINIC_OR_DEPARTMENT_OTHER): Payer: Medicare Other | Attending: General Surgery

## 2012-02-11 DIAGNOSIS — T310 Burns involving less than 10% of body surface: Secondary | ICD-10-CM | POA: Insufficient documentation

## 2012-02-11 DIAGNOSIS — X19XXXA Contact with other heat and hot substances, initial encounter: Secondary | ICD-10-CM | POA: Insufficient documentation

## 2012-02-11 DIAGNOSIS — T25329A Burn of third degree of unspecified foot, initial encounter: Secondary | ICD-10-CM | POA: Insufficient documentation

## 2012-02-12 ENCOUNTER — Encounter (INDEPENDENT_AMBULATORY_CARE_PROVIDER_SITE_OTHER): Payer: Self-pay | Admitting: General Surgery

## 2012-02-12 ENCOUNTER — Ambulatory Visit (INDEPENDENT_AMBULATORY_CARE_PROVIDER_SITE_OTHER): Payer: Medicare Other | Admitting: General Surgery

## 2012-02-12 VITALS — BP 126/60 | HR 78 | Temp 98.4°F | Resp 18 | Ht 60.0 in | Wt 126.4 lb

## 2012-02-12 DIAGNOSIS — D1739 Benign lipomatous neoplasm of skin and subcutaneous tissue of other sites: Secondary | ICD-10-CM

## 2012-02-12 DIAGNOSIS — N644 Mastodynia: Secondary | ICD-10-CM

## 2012-02-12 DIAGNOSIS — D1720 Benign lipomatous neoplasm of skin and subcutaneous tissue of unspecified limb: Secondary | ICD-10-CM

## 2012-02-12 NOTE — Patient Instructions (Signed)
Continue to walk and move around.    Follow up in 1 year.  Get mammogram as scheduled.

## 2012-02-12 NOTE — Assessment & Plan Note (Signed)
No clinical evidence of disease.  Get mammogram next September.  Follow up in 1 year.
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
## 2012-02-12 NOTE — Assessment & Plan Note (Signed)
Seroma resolved.  Hematoma continuing to resolve.  Follow up as needed for lipoma issue.

## 2012-02-14 ENCOUNTER — Ambulatory Visit (INDEPENDENT_AMBULATORY_CARE_PROVIDER_SITE_OTHER): Payer: Medicare Other | Admitting: *Deleted

## 2012-02-14 DIAGNOSIS — I428 Other cardiomyopathies: Secondary | ICD-10-CM

## 2012-02-14 DIAGNOSIS — I5022 Chronic systolic (congestive) heart failure: Secondary | ICD-10-CM

## 2012-02-14 DIAGNOSIS — Z950 Presence of cardiac pacemaker: Secondary | ICD-10-CM

## 2012-02-19 LAB — REMOTE ICD DEVICE
AL AMPLITUDE: 4.9 mv
AL IMPEDENCE ICD: 414 Ohm
ATRIAL PACING ICD: 0 pct
BATTERY VOLTAGE: 2.56 V
BRDY-0002RA: 55 {beats}/min
BRDY-0003RA: 120 {beats}/min
CHARGE TIME: 9.7 s
DEV-0020ICD: NEGATIVE
DEVICE MODEL ICD: 215054
FVT: 0
HV IMPEDENCE: 42 Ohm
MODE SWITCH EPISODES: 0
PACEART VT: 0
TOT-0006: 20130808000000
TZAT-0001FASTVT: 1
TZAT-0001FASTVT: 2
TZAT-0001SLOWVT: 1
TZAT-0001SLOWVT: 2
TZAT-0002FASTVT: NEGATIVE
TZAT-0013FASTVT: 2
TZAT-0013SLOWVT: 2
TZAT-0013SLOWVT: 2
TZAT-0018FASTVT: NEGATIVE
TZAT-0018FASTVT: NEGATIVE
TZAT-0018SLOWVT: NEGATIVE
TZAT-0018SLOWVT: NEGATIVE
TZON-0003FASTVT: 300 ms
TZON-0003SLOWVT: 375 ms
TZST-0001FASTVT: 3
TZST-0001FASTVT: 4
TZST-0001FASTVT: 5
TZST-0001FASTVT: 6
TZST-0001FASTVT: 7
TZST-0001SLOWVT: 3
TZST-0001SLOWVT: 4
TZST-0001SLOWVT: 5
TZST-0001SLOWVT: 6
TZST-0001SLOWVT: 7
TZST-0003FASTVT: 21 J
TZST-0003FASTVT: 31 J
TZST-0003FASTVT: 31 J
TZST-0003FASTVT: 31 J
TZST-0003FASTVT: 31 J
TZST-0003SLOWVT: 21 J
TZST-0003SLOWVT: 31 J
TZST-0003SLOWVT: 31 J
TZST-0003SLOWVT: 31 J
TZST-0003SLOWVT: 31 J
VENTRICULAR PACING ICD: 99 pct
VF: 0

## 2012-03-10 ENCOUNTER — Encounter: Payer: Self-pay | Admitting: Internal Medicine

## 2012-03-10 ENCOUNTER — Encounter (HOSPITAL_BASED_OUTPATIENT_CLINIC_OR_DEPARTMENT_OTHER): Payer: Medicare Other | Attending: General Surgery

## 2012-05-15 ENCOUNTER — Ambulatory Visit (INDEPENDENT_AMBULATORY_CARE_PROVIDER_SITE_OTHER): Payer: Medicare Other | Admitting: *Deleted

## 2012-05-15 DIAGNOSIS — Z95810 Presence of automatic (implantable) cardiac defibrillator: Secondary | ICD-10-CM

## 2012-05-15 DIAGNOSIS — I428 Other cardiomyopathies: Secondary | ICD-10-CM

## 2012-05-23 LAB — REMOTE ICD DEVICE
AL AMPLITUDE: 4.1 mv
AL IMPEDENCE ICD: 418 Ohm
ATRIAL PACING ICD: 0 pct
BATTERY VOLTAGE: 2.55 V
BRDY-0002RA: 55 {beats}/min
BRDY-0003RA: 120 {beats}/min
CHARGE TIME: 9.7 s
DEV-0020ICD: NEGATIVE
DEVICE MODEL ICD: 215054
FVT: 0
HV IMPEDENCE: 41 Ohm
MODE SWITCH EPISODES: 0
PACEART VT: 0
TOT-0006: 20131107000000
TZAT-0001FASTVT: 1
TZAT-0001FASTVT: 2
TZAT-0001SLOWVT: 1
TZAT-0001SLOWVT: 2
TZAT-0002FASTVT: NEGATIVE
TZAT-0013FASTVT: 2
TZAT-0013SLOWVT: 2
TZAT-0013SLOWVT: 2
TZAT-0018FASTVT: NEGATIVE
TZAT-0018FASTVT: NEGATIVE
TZAT-0018SLOWVT: NEGATIVE
TZAT-0018SLOWVT: NEGATIVE
TZON-0003FASTVT: 300 ms
TZON-0003SLOWVT: 375 ms
TZST-0001FASTVT: 3
TZST-0001FASTVT: 4
TZST-0001FASTVT: 5
TZST-0001FASTVT: 6
TZST-0001FASTVT: 7
TZST-0001SLOWVT: 3
TZST-0001SLOWVT: 4
TZST-0001SLOWVT: 5
TZST-0001SLOWVT: 6
TZST-0001SLOWVT: 7
TZST-0003FASTVT: 21 J
TZST-0003FASTVT: 31 J
TZST-0003FASTVT: 31 J
TZST-0003FASTVT: 31 J
TZST-0003FASTVT: 31 J
TZST-0003SLOWVT: 21 J
TZST-0003SLOWVT: 31 J
TZST-0003SLOWVT: 31 J
TZST-0003SLOWVT: 31 J
TZST-0003SLOWVT: 31 J
VENTRICULAR PACING ICD: 95 pct
VF: 0

## 2012-06-11 ENCOUNTER — Encounter: Payer: Self-pay | Admitting: *Deleted

## 2012-06-18 ENCOUNTER — Encounter: Payer: Self-pay | Admitting: Internal Medicine

## 2012-06-25 ENCOUNTER — Telehealth: Payer: Self-pay | Admitting: *Deleted

## 2012-06-25 NOTE — Telephone Encounter (Signed)
Left message for a return phone call to reschedule her appt.  Awaiting patient response, I have cancelled her appts.

## 2012-06-27 ENCOUNTER — Telehealth: Payer: Self-pay | Admitting: *Deleted

## 2012-06-27 NOTE — Telephone Encounter (Signed)
Received a return phone call from patient to reschedule her appt.  Confirmed appt. For 08/28/12 at 10am with Bernell List.  Then will become Dr. Darnelle Catalan.

## 2012-07-17 ENCOUNTER — Other Ambulatory Visit (HOSPITAL_COMMUNITY): Payer: Self-pay | Admitting: Orthopedic Surgery

## 2012-07-17 DIAGNOSIS — M25561 Pain in right knee: Secondary | ICD-10-CM

## 2012-07-25 ENCOUNTER — Encounter (HOSPITAL_COMMUNITY)
Admission: RE | Admit: 2012-07-25 | Discharge: 2012-07-25 | Disposition: A | Discharge status: Home / Self Care | Payer: Medicare Other | Source: Ambulatory Visit | Attending: Orthopedic Surgery | Admitting: Orthopedic Surgery

## 2012-07-25 DIAGNOSIS — M25569 Pain in unspecified knee: Secondary | ICD-10-CM | POA: Insufficient documentation

## 2012-07-25 DIAGNOSIS — M25561 Pain in right knee: Secondary | ICD-10-CM

## 2012-07-25 MED ORDER — TECHNETIUM TC 99M MEDRONATE IV KIT
25.6000 | PACK | Freq: Once | INTRAVENOUS | Status: AC | PRN
  Administered 2012-07-25: 25.6 via INTRAVENOUS

## 2012-08-07 ENCOUNTER — Other Ambulatory Visit: Payer: Self-pay | Admitting: Internal Medicine

## 2012-08-07 ENCOUNTER — Encounter: Payer: Self-pay | Admitting: Cardiology

## 2012-08-07 ENCOUNTER — Ambulatory Visit (INDEPENDENT_AMBULATORY_CARE_PROVIDER_SITE_OTHER): Payer: Medicare Other | Admitting: Cardiology

## 2012-08-07 VITALS — BP 94/59 | HR 65 | Ht 60.0 in | Wt 138.4 lb

## 2012-08-07 DIAGNOSIS — I5022 Chronic systolic (congestive) heart failure: Secondary | ICD-10-CM

## 2012-08-07 DIAGNOSIS — I428 Other cardiomyopathies: Secondary | ICD-10-CM

## 2012-08-07 DIAGNOSIS — Z950 Presence of cardiac pacemaker: Secondary | ICD-10-CM

## 2012-08-07 DIAGNOSIS — I447 Left bundle-branch block, unspecified: Secondary | ICD-10-CM

## 2012-08-07 NOTE — Patient Instructions (Addendum)
Remote monitoring is used to monitor your Pacemaker of ICD from home. This monitoring reduces the number of office visits required to check your device to one time per year. It allows us to keep an eye on the functioning of your device to ensure it is working properly. You are scheduled for a device check from home on 11/07/2012. You may send your transmission at any time that day. If you have a wireless device, the transmission will be sent automatically. After your physician reviews your transmission, you will receive a postcard with your next transmission date.  Your physician wants you to follow-up in: 6 months with Dr. Taylor. You will receive a reminder letter in the mail two months in advance. If you don't receive a letter, please call our office to schedule the follow-up appointment.  Your physician recommends that you continue on your current medications as directed. Please refer to the Current Medication list given to you today.  

## 2012-08-10 ENCOUNTER — Encounter: Payer: Self-pay | Admitting: Cardiology

## 2012-08-10 LAB — ICD DEVICE OBSERVATION
AL AMPLITUDE: 4 mv
AL IMPEDENCE ICD: 402 Ohm
AL THRESHOLD: 0.6 V
ATRIAL PACING ICD: 0 pct
BATTERY VOLTAGE: 2.54 V
CHARGE TIME: 10.2 s
DEV-0020ICD: NEGATIVE
DEVICE MODEL ICD: 215054
FVT: 0
HV IMPEDENCE: 42 Ohm
LV LEAD AMPLITUDE: 25 mv
LV LEAD IMPEDENCE ICD: 813 Ohm
LV LEAD THRESHOLD: 1.2 V
MODE SWITCH EPISODES: 0
PACEART VT: 0
RV LEAD AMPLITUDE: 25 mv
RV LEAD IMPEDENCE ICD: 646 Ohm
RV LEAD THRESHOLD: 0.6 V
TZAT-0001FASTVT: 1
TZAT-0001FASTVT: 2
TZAT-0001SLOWVT: 1
TZAT-0001SLOWVT: 2
TZAT-0002FASTVT: NEGATIVE
TZAT-0013FASTVT: 2
TZAT-0013SLOWVT: 2
TZAT-0013SLOWVT: 2
TZAT-0018FASTVT: NEGATIVE
TZAT-0018FASTVT: NEGATIVE
TZAT-0018SLOWVT: NEGATIVE
TZAT-0018SLOWVT: NEGATIVE
TZON-0003FASTVT: 300 ms
TZON-0003SLOWVT: 375 ms
TZST-0001FASTVT: 3
TZST-0001FASTVT: 4
TZST-0001FASTVT: 5
TZST-0001FASTVT: 6
TZST-0001FASTVT: 7
TZST-0001SLOWVT: 3
TZST-0001SLOWVT: 4
TZST-0001SLOWVT: 5
TZST-0001SLOWVT: 6
TZST-0001SLOWVT: 7
TZST-0003FASTVT: 21 J
TZST-0003FASTVT: 31 J
TZST-0003FASTVT: 31 J
TZST-0003FASTVT: 31 J
TZST-0003FASTVT: 31 J
TZST-0003SLOWVT: 21 J
TZST-0003SLOWVT: 31 J
TZST-0003SLOWVT: 31 J
TZST-0003SLOWVT: 31 J
TZST-0003SLOWVT: 31 J
VENTRICULAR PACING ICD: 98 pct
VF: 0

## 2012-08-10 NOTE — Progress Notes (Signed)
ELECTROPHYSIOLOGY OFFICE NOTE  Patient ID: [name] MRN: [id number], DOB/AGE: [birth year]   Date of Visit: 08/10/2012  Primary Physician: Stacey Devon, MD Primary Cardiologist / EP: Stacey Prime, MD / Stacey Bunting, MD Reason for Visit: EP/device follow-up  History of Present Illness  [name] is a pleasant 79 year old woman with a NICM, EF 30%, LBBB and chronic systolic HF s/p CRT-D implant who presents today for routine electrophysiology followup. Since last being seen in our clinic, she reports she is doing well. She has no complaints. Today, she denies chest pain or shortness of breath. She denies palpitations, dizziness, near syncope or syncope. She denies LE swelling, orthopnea, PND or recent weight gain. [name] reports that she is compliant and tolerating medications without difficulty.  Past Medical History Past Medical History  Diagnosis Date  . Syncope   . Systolic CHF   . Depression   . ICD (implantable cardiac defibrillator), biventricular, in situ   . LBBB (left bundle branch block)   . Dyslipidemia   . CHF (congestive heart failure)     PACEMAKER & DEFIB  . Fainted 04/21/06    AT [facility]  . Hypertension   . Arthritis   . Wears glasses   . Headache   . GERD (gastroesophageal reflux disease)   . HLD (hyperlipidemia)   . Hypothyroidism   . Cancer     left breast cancer   . ICD (implantable cardiac defibrillator) in place     pt has pacer/icd  . Pacemaker     Guidant Device  . Complication of anesthesia     hypotensive after back surgery in 2006- reports on chart   . Nonischemic cardiomyopathy   . Normal coronary arteries     s/p cardiac cath 2007    Past Surgical History Past Surgical History  Procedure Laterality Date  . Total knee arthroplasty  05/17/01    RIGHT KNEE  . Lumbar fusion  2006  . Mastectomy, partial  2008    GOT PACEMAKER AND DEFIB AT THAT TIME  . Breast surgery  2000    LUMP REMOVAL. STAGE 1 CANCER  . Joint replacement  06/14/01      right  . Pacemaker insertion  04/23/06  . Back surgery      lumbar fusion   . Cardiac catheterization    . Mass excision  11/08/2011    Procedure: EXCISION MASS;  Surgeon: Almond Lint, MD;  Location: [facility];  Service: General;  Laterality: Left;  Excision Left Thigh Mass    Allergies/Intolerances Allergies  Allergen Reactions  . Shellfish Allergy Shortness Of Breath  . Aspirin Nausea And Vomiting  . Codeine Nausea And Vomiting   Current Home Medications Current Outpatient Prescriptions  Medication Sig Dispense Refill  . acetaminophen (TYLENOL) 325 MG tablet Take 650 mg by mouth 2 (two) times daily.       Marland Kitchen buPROPion (WELLBUTRIN SR) 150 MG 12 hr tablet Take 450 mg by mouth every morning.       . Calcium Carbonate (CALCIUM 500 PO) Take 500 mg by mouth daily.       . Cholecalciferol (VITAMIN D) 2000 UNITS CAPS Take 1 capsule by mouth daily.       . furosemide (LASIX) 40 MG tablet Take 20-40 mg by mouth 2 (two) times daily. 40 mg in the morning and 20 mg at night      . L-Methylfolate-Algae (DEPLIN 15) 15-90.314 MG CAPS Take 15 mg by mouth every morning.       Marland Kitchen  levothyroxine (SYNTHROID, LEVOTHROID) 25 MCG tablet Take 25 mcg by mouth daily before breakfast.       . metoprolol (LOPRESSOR) 50 MG tablet Take 25 mg by mouth 2 (two) times daily. takle 1/2 tablet twice daily      . olmesartan (BENICAR) 40 MG tablet Take 40 mg by mouth every morning.       Marland Kitchen omeprazole (PRILOSEC) 40 MG capsule Take 40 mg by mouth every morning.       . OXcarbazepine (TRILEPTAL) 150 MG tablet Take 150 mg by mouth every morning.       . potassium chloride (K-DUR) 10 MEQ tablet       . potassium chloride SA (K-DUR,KLOR-CON) 20 MEQ tablet Take 20 mEq by mouth every morning.       Marland Kitchen spironolactone (ALDACTONE) 25 MG tablet Take 25 mg by mouth every morning.       . vitamin E 800 UNIT capsule Take 800 Units by mouth daily.        No current facility-administered medications for this visit.   Social  History Social History  . Marital Status: Married   Social History Main Topics  . Smoking status: Never Smoker   . Smokeless tobacco: Never Used  . Alcohol Use: No  . Drug Use: No   Review of Systems General: No chills, fever, night sweats or weight changes Cardiovascular: No chest pain, dyspnea on exertion, edema, orthopnea, palpitations, paroxysmal nocturnal dyspnea Dermatological: No rash, lesions or masses Respiratory: No cough, dyspnea Urologic: No hematuria, dysuria Abdominal: No nausea, vomiting, diarrhea, bright red blood per rectum, melena, or hematemesis Neurologic: No visual changes, weakness, changes in mental status All other systems reviewed and are otherwise negative except as noted above.  Physical Exam Blood pressure 94/59, pulse 65, height 5' (1.524 m), weight 138 lb 6.4 oz (62.778 kg).  General: Well developed, well appearing 80 year old female in no acute distress. HEENT: Normocephalic, atraumatic. EOMs intact. Sclera nonicteric. Oropharynx clear.  Neck: Supple without bruits. No JVD. Lungs: Respirations regular and unlabored, CTA bilaterally. No wheezes, rales or rhonchi. Heart: RRR. S1, S2 present. No murmurs, rub, S3 or S4. Abdomen: Soft, non-distended.  Extremities: No clubbing, cyanosis or edema. PT/Radials 2+ and equal bilaterally. Psych: Normal affect. Neuro: Alert and oriented X 3. Moves all extremities spontaneously.   Diagnostics Device interrogation today - Normal BiV ICD function. Thresholds and sensing consistent with previous device measurements. Lead impedance trends stable over time. No mode switch episodes recorded. No ventricular arrhythmia episodes recorded. Patient bi-ventricularly pacing 98% of the time. Device programmed with appropriate safety margins. No changes made this session.   Assessment and Plan 1. NICM with LBBB and chronic systolic HF s/p CRT-D implant Normal device function No programming changes made HF stable; euvolemic by  exam Continue medical therapy Return to device clinic in 3 months Return for follow-up with Dr. Ladona Hurley in 6 months  Signed, Stacey Duff, PA-C 08/10/2012, 9:40 AM

## 2012-08-28 ENCOUNTER — Encounter: Payer: Self-pay | Admitting: Family

## 2012-08-28 ENCOUNTER — Ambulatory Visit (HOSPITAL_BASED_OUTPATIENT_CLINIC_OR_DEPARTMENT_OTHER): Payer: Medicare Other | Admitting: Family

## 2012-08-28 VITALS — BP 100/63 | HR 67 | Temp 98.0°F | Resp 20 | Ht 60.0 in | Wt 139.9 lb

## 2012-08-28 DIAGNOSIS — N644 Mastodynia: Secondary | ICD-10-CM

## 2012-08-28 DIAGNOSIS — Z853 Personal history of malignant neoplasm of breast: Secondary | ICD-10-CM

## 2012-08-28 NOTE — Patient Instructions (Addendum)
Please contact us at [phone] if you have any questions or concerns.  Please continue to do well and enjoy life!!!  Get plenty of rest, drink plenty of water, exercise daily (walking), eat a balanced diet.  Keep taking calcium daily in addition to vitamin D3 daily.   Complete monthly self-breast examinations.  Have a clinical breast exam by a physician every year  Have your mammogram completed every year.

## 2012-08-28 NOTE — Progress Notes (Signed)
[facility]  Telephone:(336) [phone] Fax:([phone]) [phone]  OFFICE PROGRESS NOTE   ID: [name]   DOB: [birth year]  MR#: [id number]  JYN#:[id number]   PCP: Alva Garnet., MD GYN:  SU: Cory Munch, MD RAD ONC:  Chipper Herb, MD   HISTORY OF PRESENT ILLNESS: From Dr. Pierce Crane is new patient evaluation note dated 03/05/2007: "This is a delightful 79 year old woman from [facility] referred by Dr. Lurene Shadow for discussion of evaluation and treatment of breast cancer.  This woman has been in good health.  She undergoes annual screening mammography.  She did have a digital mammogram and was recalled for further views on 01/14/2007.  The additional views show amorphous calcifications in the upper outer quadrant.  Some of these were linear.  Ultrasound showed no evidence of cystic or solid mass.  BSGI was performed, which was negative.  She did undergo a stereotactic core biopsy on 01/22/2007.  This showed high-grade DCIS, ER/PR negative.  Patient did have a mild vasovagal reaction subsequent to that.  She subsequently underwent a lumpectomy on 02/11/2007.  Her pathology revealed a 1 cm focus of invasive cancer; distance to closest margin was 0.1 cm.  No lymphovascular invasion was seen.  This is a grade 2 lesion.  As noted, it was ER/PR negative, HER-2 was 0.  During the procedure she did have some heart bradycardia and so the procedure was terminated.  She did not have a sentinel lymph node evaluation."  Her subsequent history this is detailed below.  INTERVAL HISTORY: Dr. 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  The patient was last seen by Dr. Donnie Coffin on 08/31/2011 .  Since her last office visit, the patient has been doing relatively well.  She is establishing herself with Dr. Darrall Dears service today.  REVIEW OF SYSTEMS: A 10 point review of systems was completed and is negative except pain under her left  breast that is constant.  Her left breast has a history of having a chronic seroma cavity.  The patient states her pain is worse when she is without a bra. The patient also states that she has hot flashes.  She denies any other symptomatology.   PAST MEDICAL HISTORY: Past Medical History  Diagnosis Date  . Syncope   . Systolic CHF   . Depression   . ICD (implantable cardiac defibrillator), biventricular, in situ   . LBBB (left bundle branch block)   . Dyslipidemia   . CHF (congestive heart failure)     PACEMAKER & DEFIB  . Fainted 04/21/06    AT [facility]  . Hypertension   . Arthritis   . Wears glasses   . Headache   . GERD (gastroesophageal reflux disease)   . HLD (hyperlipidemia)   . Hypothyroidism   . Cancer     left breast cancer   . ICD (implantable cardiac defibrillator) in place     pt has pacer/icd  . Pacemaker     Guidant Device  . Complication of anesthesia     hypotensive after back surgery in 2006- reports on chart   . Nonischemic cardiomyopathy   . Normal coronary arteries     s/p cardiac cath 2007     PAST SURGICAL HISTORY: Past Surgical History  Procedure Laterality Date  . Total knee arthroplasty  05/17/01    RIGHT KNEE  . Lumbar fusion  2006  . Mastectomy, partial  2008    GOT  PACEMAKER AND DEFIB AT THAT TIME  . Breast surgery  2000    LUMP REMOVAL. STAGE 1 CANCER  . Joint replacement  06/14/01    right  . Pacemaker insertion  04/23/06  . Back surgery      lumbar fusion   . Cardiac catheterization    . Mass excision  11/08/2011    Procedure: EXCISION MASS;  Surgeon: Almond Lint, MD;  Location: [facility];  Service: General;  Laterality: Left;  Excision Left Thigh Mass  Significant for history of congestive heart failure status post spinal surgery in 2007.  She does have an ejection fraction estimated at 15%.  She does have a pacemaker and an AICD placed after that procedure in 2008.  She is status post previous left breast surgery and right knee replacement  in 2003.   FAMILY HISTORY Family History  Problem Relation Age of Onset  . Hypertension Mother   . Hypertension Father   . Hypertension Brother    Mother and father are deceased.  Two brothers, one living in [place], one in [place].  She has a half sister who is living.  There are no other family members with breast or ovarian cancer.   GYNECOLOGIC HISTORY: She is gravida 2, para 1.  Daughter lives in [place].  She is not married.  Menopause age 24.  Prempro for five years, discontinued because of cramps and bleeding.   SOCIAL HISTORY: She and her husband are both from [place] and they have been married since 1970.  Her husband, Domingo Cocking is a retired professor who Tax inspector at [employer] and has a Ph.D. from [employer] [place], [place].   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 Their daughter has a PhD in [employer] and will be moving to [place] soon.  They do not have any grandchildren.  In her spare time the patient likes to sew, go to the movies, and enjoys reading.    ADVANCED DIRECTIVES:  Not on file  HEALTH MAINTENANCE: History  Substance Use Topics  . Smoking status: Never Smoker   . Smokeless tobacco: Never Used  . Alcohol Use: No    Colonoscopy:  Not on file PAP:  Not on file Bone density:  Not on file Lipid panel:  Not on file  Allergies  Allergen Reactions  . Shellfish Allergy Shortness Of Breath  . Aspirin Nausea And Vomiting  . Codeine Nausea And Vomiting    Current Outpatient Prescriptions  Medication Sig Dispense Refill  . acetaminophen (TYLENOL) 325 MG tablet Take 650 mg by mouth 2 (two) times daily.       Marland Kitchen buPROPion (WELLBUTRIN SR) 150 MG 12 hr tablet Take 450 mg by mouth every morning.       . Calcium Carbonate (CALCIUM 500 PO) Take 500 mg by mouth daily.       . Cholecalciferol (VITAMIN D) 2000 UNITS CAPS Take 1 capsule by mouth daily.       . furosemide (LASIX) 40 MG tablet Take 20-40 mg by  mouth 2 (two) times daily. 40 mg in the morning and 20 mg at night      . L-Methylfolate-Algae (DEPLIN 15) 15-90.314 MG CAPS Take 15 mg by mouth every morning.       Marland Kitchen levothyroxine (SYNTHROID, LEVOTHROID) 25 MCG tablet Take 25 mcg by mouth daily before breakfast.       . metoprolol (LOPRESSOR) 50 MG tablet Take 25 mg by mouth 2 (two) times daily.  takle 1/2 tablet twice daily      . olmesartan (BENICAR) 40 MG tablet Take 40 mg by mouth every morning.       Marland Kitchen omeprazole (PRILOSEC) 40 MG capsule Take 40 mg by mouth every morning.       . OXcarbazepine (TRILEPTAL) 150 MG tablet Take 150 mg by mouth every morning.       . potassium chloride SA (K-DUR,KLOR-CON) 20 MEQ tablet Take 20 mEq by mouth every morning.       Marland Kitchen spironolactone (ALDACTONE) 25 MG tablet Take 25 mg by mouth every morning.       . vitamin E 800 UNIT capsule Take 800 Units by mouth daily.        No current facility-administered medications for this visit.    OBJECTIVE: Filed Vitals:   08/28/12 1012  BP: 100/63  Pulse: 67  Temp: 98 F (36.7 C)  Resp: 20     Body mass index is 27.32 kg/(m^2).      ECOG FS: 1 - Symptomatic but completely ambulatory  General appearance: Alert, cooperative, well nourished, no apparent distress Head: Normocephalic, without obvious abnormality, atraumatic, alopecia/hair thinning Eyes:  Arcus senilis will, PERRLA, EOMI Nose: Nares, septum and mucosa are normal, no drainage or sinus tenderness Neck: No adenopathy, supple, symmetrical, trachea midline, thyroid not enlarged, no tenderness Resp: Clear to auscultation bilaterally Cardio: Regular rate and rhythm, S1, S2 normal, no murmur, click, rub or gallop, left chest implanted pacemaker Breasts: Pendulous and glandular breasts bilaterally, left breast has well-healed surgical scars, left breast has architectural changes, no lymphadenopathy, no nipple inversion, no axilla fullness GI: Soft, distended, non-tender, hypoactive bowel sounds, no  organomegaly Extremities: Extremities normal, atraumatic, no cyanosis or edema, right lower extremity well healed surgical scar Lymph nodes: Cervical, supraclavicular, and axillary nodes normal Neurologic: Grossly normal   LAB RESULTS: Lab Results  Component Value Date   WBC 9.9 10/31/2011   NEUTROABS 5.1 08/24/2011   HGB 12.9 10/31/2011   HCT 38.4 10/31/2011   MCV 84.8 10/31/2011   PLT 176 10/31/2011      Chemistry      Component Value Date/Time   NA 140 10/31/2011 0855   K 3.9 10/31/2011 0855   CL 103 10/31/2011 0855   CO2 29 10/31/2011 0855   BUN 21 10/31/2011 0855   CREATININE 0.89 10/31/2011 0855      Component Value Date/Time   CALCIUM 10.8* 10/31/2011 0855   ALKPHOS 73 08/24/2011 1332   AST 19 08/24/2011 1332   ALT 17 08/24/2011 1332   BILITOT 0.2* 08/24/2011 1332       Lab Results  Component Value Date   LABCA2 26 08/17/2010    Urinalysis    Component Value Date/Time   COLORURINE YELLOW 07/29/2011 1418   APPEARANCEUR CLEAR 07/29/2011 1418   LABSPEC 1.011 07/29/2011 1418   PHURINE 7.5 07/29/2011 1418   GLUCOSEU NEGATIVE 07/29/2011 1418   HGBUR NEGATIVE 07/29/2011 1418   BILIRUBINUR NEGATIVE 07/29/2011 1418   BILIRUBINUR negative 05/16/2011 1131   KETONESUR NEGATIVE 07/29/2011 1418   PROTEINUR NEGATIVE 07/29/2011 1418   UROBILINOGEN 0.2 07/29/2011 1418   UROBILINOGEN 0.2 05/16/2011 1131   NITRITE NEGATIVE 07/29/2011 1418   NITRITE negative 05/16/2011 1131   LEUKOCYTESUR NEGATIVE 07/29/2011 1418    STUDIES: No results found.  ASSESSMENT: 79 [place]: 1.  Status post left breast needle core biopsy in the upper outer quadrant on 01/22/2007 which showed ductal carcinoma in situ, high grade with necrosis and  calcifications detached fragments of the tumor are present, fibrocystic changes ER 0%, PR 0%.  2.  Status post left breast needle localized lumpectomy on 02/11/2007 for a stage I, pT1b pN0, 1 cm invasive and in situ ductal carcinoma, ER 0%, PR 0%, Ki-67  34%, HER-2/neu negative.  3.  Status post radiation therapy from 03/25/2007 -  05/19/2007.    4.  The patient's last bilateral digital diagnostic mammograms on 01/07/2012 showed scattered parenchymal densities are present  (approximately 25% - 35% glandular ).  There is stable architectural distortion at the lumpectomy site on the left.  No new or worrisome finding is seen in either breast.  Patient has a pacemaker in place on the left.    PLAN: The patient is nearing 6  years from her time of diagnosis and will officially become a graduate of [facility]'s breast cancer program.  We asked that she continue annual clinical breast examinations by a physician in addition to annual mammography.  Her last mammogram results are listed above.  She is due for her annual mammogram in 12/2012.  All questions were answered.  The patient was encouraged to contact us with any problems, questions or concerns.   Larina Bras, NP-C 08/30/2012, 9:34 PM

## 2012-08-29 ENCOUNTER — Ambulatory Visit: Payer: Medicare Other | Admitting: Oncology

## 2012-08-29 ENCOUNTER — Other Ambulatory Visit: Payer: Medicare Other | Admitting: Lab

## 2012-09-02 ENCOUNTER — Encounter: Payer: Self-pay | Admitting: Family

## 2012-09-24 ENCOUNTER — Encounter: Payer: Self-pay | Admitting: Internal Medicine

## 2012-11-07 ENCOUNTER — Encounter: Payer: Medicare Other | Admitting: *Deleted

## 2012-11-28 ENCOUNTER — Ambulatory Visit (INDEPENDENT_AMBULATORY_CARE_PROVIDER_SITE_OTHER): Payer: Medicare Other | Admitting: *Deleted

## 2012-11-28 DIAGNOSIS — Z95810 Presence of automatic (implantable) cardiac defibrillator: Secondary | ICD-10-CM

## 2012-11-28 DIAGNOSIS — I428 Other cardiomyopathies: Secondary | ICD-10-CM

## 2012-12-03 LAB — REMOTE ICD DEVICE
AL AMPLITUDE: 4.3 mv
AL IMPEDENCE ICD: 418 Ohm
ATRIAL PACING ICD: 0 pct
BATTERY VOLTAGE: 2.5 V
BRDY-0002RA: 55 {beats}/min
BRDY-0003RA: 120 {beats}/min
CHARGE TIME: 11.3 s
DEV-0020ICD: NEGATIVE
DEVICE MODEL ICD: 215054
FVT: 0
HV IMPEDENCE: 43 Ohm
MODE SWITCH EPISODES: 0
PACEART VT: 0
TOT-0006: 20140807000000
TZAT-0001FASTVT: 1
TZAT-0001FASTVT: 2
TZAT-0001SLOWVT: 1
TZAT-0001SLOWVT: 2
TZAT-0002FASTVT: NEGATIVE
TZAT-0013FASTVT: 2
TZAT-0013SLOWVT: 2
TZAT-0013SLOWVT: 2
TZAT-0018FASTVT: NEGATIVE
TZAT-0018FASTVT: NEGATIVE
TZAT-0018SLOWVT: NEGATIVE
TZAT-0018SLOWVT: NEGATIVE
TZON-0003FASTVT: 300 ms
TZON-0003SLOWVT: 375 ms
TZST-0001FASTVT: 3
TZST-0001FASTVT: 4
TZST-0001FASTVT: 5
TZST-0001FASTVT: 6
TZST-0001FASTVT: 7
TZST-0001SLOWVT: 3
TZST-0001SLOWVT: 4
TZST-0001SLOWVT: 5
TZST-0001SLOWVT: 6
TZST-0001SLOWVT: 7
TZST-0003FASTVT: 21 J
TZST-0003FASTVT: 31 J
TZST-0003FASTVT: 31 J
TZST-0003FASTVT: 31 J
TZST-0003FASTVT: 31 J
TZST-0003SLOWVT: 21 J
TZST-0003SLOWVT: 31 J
TZST-0003SLOWVT: 31 J
TZST-0003SLOWVT: 31 J
TZST-0003SLOWVT: 31 J
VENTRICULAR PACING ICD: 100 pct
VF: 0

## 2012-12-05 ENCOUNTER — Encounter: Payer: Self-pay | Admitting: *Deleted

## 2012-12-05 ENCOUNTER — Ambulatory Visit (INDEPENDENT_AMBULATORY_CARE_PROVIDER_SITE_OTHER): Payer: Medicare Other | Admitting: Internal Medicine

## 2012-12-05 ENCOUNTER — Encounter: Payer: Self-pay | Admitting: Internal Medicine

## 2012-12-05 VITALS — BP 115/71 | HR 77 | Ht 60.0 in | Wt 142.6 lb

## 2012-12-05 DIAGNOSIS — I509 Heart failure, unspecified: Secondary | ICD-10-CM

## 2012-12-05 DIAGNOSIS — Z95810 Presence of automatic (implantable) cardiac defibrillator: Secondary | ICD-10-CM

## 2012-12-05 DIAGNOSIS — I5022 Chronic systolic (congestive) heart failure: Secondary | ICD-10-CM

## 2012-12-05 LAB — BASIC METABOLIC PANEL
BUN: 23 mg/dL (ref 6–23)
CO2: 28 mEq/L (ref 19–32)
Calcium: 10.3 mg/dL (ref 8.4–10.5)
Chloride: 104 mEq/L (ref 96–112)
Creatinine, Ser: 1.1 mg/dL (ref 0.4–1.2)
GFR: 63.3 mL/min (ref >60.00)
Glucose, Bld: 87 mg/dL (ref 70–99)
Potassium: 4.3 mEq/L (ref 3.5–5.1)
Sodium: 137 mEq/L (ref 135–145)

## 2012-12-05 LAB — CBC WITH DIFFERENTIAL/PLATELET
Basophils Absolute: 0 10*3/uL (ref 0.0–0.1)
Basophils Relative: 0.5 % (ref 0.0–3.0)
Eosinophils Absolute: 0.3 10*3/uL (ref 0.0–0.7)
Eosinophils Relative: 3.9 % (ref 0.0–5.0)
HCT: 36.6 % (ref 36.0–46.0)
Hemoglobin: 12.3 g/dL (ref 12.0–15.0)
Lymphocytes Relative: 35.3 % (ref 12.0–46.0)
Lymphs Abs: 2.8 10*3/uL (ref 0.7–4.0)
MCHC: 33.5 g/dL (ref 30.0–36.0)
MCV: 87.2 fl (ref 78.0–100.0)
Monocytes Absolute: 0.7 10*3/uL (ref 0.1–1.0)
Monocytes Relative: 9.5 % (ref 3.0–12.0)
Neutro Abs: 4 10*3/uL (ref 1.4–7.7)
Neutrophils Relative %: 50.8 % (ref 43.0–77.0)
Platelets: 155 10*3/uL (ref 150.0–400.0)
RBC: 4.19 Mil/uL (ref 3.87–5.11)
RDW: 15 % — ABNORMAL HIGH (ref 11.5–14.6)
WBC: 7.8 10*3/uL (ref 4.5–10.5)

## 2012-12-05 NOTE — Progress Notes (Signed)
HPI [name] returns today for followup. She is a very pleasant 79-year-old woman with chronic systolic heart failure, left bundle branch block, status post biventricular ICD insertion, 6-1/2 years ago. In the interim she has been stable. She denies chest pain. She has class II heart failure symptoms. No syncope. No recent ICD shock. Allergies  Allergen Reactions  . Shellfish Allergy Shortness Of Breath  . Aspirin Nausea And Vomiting  . Codeine Nausea And Vomiting     Current Outpatient Prescriptions  Medication Sig Dispense Refill  . acetaminophen (TYLENOL) 325 MG tablet Take 650 mg by mouth 2 (two) times daily.       . buPROPion (WELLBUTRIN SR) 150 MG 12 hr tablet Take 450 mg by mouth every morning.       . Calcium Carbonate (CALCIUM 500 PO) Take 500 mg by mouth daily.       . Cholecalciferol (VITAMIN D) 2000 UNITS CAPS Take 1 capsule by mouth daily.       . furosemide (LASIX) 40 MG tablet Take by mouth 2 (two) times daily. 40 mg in the morning and 20 mg at night      . L-Methylfolate-Algae (DEPLIN 15) 15-90.314 MG CAPS Take 15 mg by mouth every morning.       . levothyroxine (SYNTHROID, LEVOTHROID) 25 MCG tablet Take 25 mcg by mouth daily before breakfast.       . metoprolol (LOPRESSOR) 50 MG tablet Take 25 mg by mouth 2 (two) times daily. takle 1/2 tablet twice daily      . olmesartan (BENICAR) 40 MG tablet Take 40 mg by mouth every morning.       . omeprazole (PRILOSEC) 40 MG capsule Take 40 mg by mouth every morning.       . OXcarbazepine (TRILEPTAL) 150 MG tablet Take 150 mg by mouth every morning.       . potassium chloride SA (K-DUR,KLOR-CON) 20 MEQ tablet Take 20 mEq by mouth every morning.       . spironolactone (ALDACTONE) 25 MG tablet Take 25 mg by mouth every morning.       . vitamin E 800 UNIT capsule Take 800 Units by mouth daily.        No current facility-administered medications for this visit.     Past Medical History  Diagnosis Date  . Syncope   . Systolic CHF    . Depression   . ICD (implantable cardiac defibrillator), biventricular, in situ   . LBBB (left bundle branch block)   . Dyslipidemia   . CHF (congestive heart failure)     PACEMAKER & DEFIB  . Fainted 04/21/06    AT [facility]  . Hypertension   . Arthritis   . Wears glasses   . Headache(784.0)   . GERD (gastroesophageal reflux disease)   . HLD (hyperlipidemia)   . Hypothyroidism   . Cancer     left breast cancer   . ICD (implantable cardiac defibrillator) in place     pt has pacer/icd  . Pacemaker     Guidant Device  . Complication of anesthesia     hypotensive after back surgery in 2006- reports on chart   . Nonischemic cardiomyopathy   . Normal coronary arteries     s/p cardiac cath 2007    ROS:   All systems reviewed and negative except as noted in the HPI.   Past Surgical History  Procedure Laterality Date  . Total knee arthroplasty  05/17/01    RIGHT KNEE  .   Lumbar fusion  2006  . Mastectomy, partial  2008    GOT PACEMAKER AND DEFIB AT THAT TIME  . Breast surgery  2000    LUMP REMOVAL. STAGE 1 CANCER  . Joint replacement  06/14/01    right  . Pacemaker insertion  04/23/06  . Back surgery      lumbar fusion   . Cardiac catheterization    . Mass excision  11/08/2011    Procedure: EXCISION MASS;  Surgeon: Faera Byerly, MD;  Location: [facility];  Service: General;  Laterality: Left;  Excision Left Thigh Mass     Family History  Problem Relation Age of Onset  . Hypertension Mother   . Hypertension Father   . Hypertension Brother      History   Social History  . Marital Status: Married    Spouse Name: N/A    Number of Children: N/A  . Years of Education: N/A   Occupational History  . Not on file.   Social History Main Topics  . Smoking status: Never Smoker   . Smokeless tobacco: Never Used  . Alcohol Use: No  . Drug Use: No  . Sexual Activity: Not Currently    Birth Control/ Protection: Post-menopausal   Other Topics Concern  . Not on file    Social History Narrative  . No narrative on file     BP 115/71  Pulse 77  Ht 5' (1.524 m)  Wt 142 lb 9.6 oz (64.683 kg)  BMI 27.85 kg/m2  Physical Exam:  Well appearing 79-year-old woman, NAD HEENT: Unremarkable Neck:  7 cm JVD, no thyromegally Back:  No CVA tenderness Lungs:  Clear with no wheezes, rales, or rhonchi. HEART:  Regular rate rhythm, no murmurs, no rubs, no clicks Abd:  soft, positive bowel sounds, no organomegally, no rebound, no guarding Ext:  2 plus pulses, no edema, no cyanosis, no clubbing Skin:  No rashes no nodules Neuro:  CN II through XII intact, motor grossly intact   DEVICE  Normal device function.  See PaceArt for details.   Assess/Plan: 

## 2012-12-05 NOTE — Assessment & Plan Note (Signed)
Her heart failure symptoms are currently class II. We'll plan to continue her current medical therapy.

## 2012-12-05 NOTE — Patient Instructions (Signed)
Will have battery replaced in defibrillator  See instruction sheet

## 2012-12-05 NOTE — Assessment & Plan Note (Signed)
Her biventricular ICD is electrical placement. We'll schedule her generator change next several months.

## 2012-12-09 ENCOUNTER — Encounter (HOSPITAL_COMMUNITY): Payer: Self-pay | Admitting: Pharmacy Technician

## 2012-12-17 MED ORDER — CEFAZOLIN SODIUM-DEXTROSE 2-3 GM-% IV SOLR
2.0000 g | INTRAVENOUS | Status: AC
  Filled 2012-12-17: qty 50

## 2012-12-17 MED ORDER — SODIUM CHLORIDE 0.9 % IR SOLN
80.0000 mg | Status: AC
  Filled 2012-12-17: qty 2

## 2012-12-18 ENCOUNTER — Encounter (HOSPITAL_COMMUNITY)
Admission: RE | Disposition: A | Discharge status: Home / Self Care | Payer: Self-pay | Source: Ambulatory Visit | Attending: Internal Medicine

## 2012-12-18 ENCOUNTER — Ambulatory Visit (HOSPITAL_COMMUNITY)
Admission: RE | Admit: 2012-12-18 | Discharge: 2012-12-18 | Disposition: A | Discharge status: Home / Self Care | Payer: Medicare Other | Source: Ambulatory Visit | Attending: Internal Medicine | Admitting: Internal Medicine

## 2012-12-18 DIAGNOSIS — I447 Left bundle-branch block, unspecified: Secondary | ICD-10-CM | POA: Insufficient documentation

## 2012-12-18 DIAGNOSIS — I428 Other cardiomyopathies: Secondary | ICD-10-CM

## 2012-12-18 DIAGNOSIS — Z4502 Encounter for adjustment and management of automatic implantable cardiac defibrillator: Secondary | ICD-10-CM | POA: Insufficient documentation

## 2012-12-18 DIAGNOSIS — I5022 Chronic systolic (congestive) heart failure: Secondary | ICD-10-CM | POA: Insufficient documentation

## 2012-12-18 DIAGNOSIS — I509 Heart failure, unspecified: Secondary | ICD-10-CM

## 2012-12-18 HISTORY — PX: IMPLANTABLE CARDIOVERTER DEFIBRILLATOR GENERATOR CHANGE: SHX5474

## 2012-12-18 LAB — SURGICAL PCR SCREEN
MRSA, PCR: NEGATIVE
Staphylococcus aureus: NEGATIVE

## 2012-12-18 SURGERY — IMPLANTABLE CARDIOVERTER DEFIBRILLATOR GENERATOR CHANGE
Anesthesia: LOCAL

## 2012-12-18 MED ORDER — ACETAMINOPHEN 325 MG PO TABS: 325.0000 mg | ORAL_TABLET | ORAL | Status: DC | PRN

## 2012-12-18 MED ORDER — ONDANSETRON HCL 4 MG/2ML IJ SOLN: 4.0000 mg | Freq: Four times a day (QID) | INTRAMUSCULAR | Status: DC | PRN

## 2012-12-18 MED ORDER — MUPIROCIN 2 % EX OINT
TOPICAL_OINTMENT | Freq: Two times a day (BID) | CUTANEOUS | Status: DC
  Filled 2012-12-18: qty 22

## 2012-12-18 MED ORDER — MUPIROCIN 2 % EX OINT
TOPICAL_OINTMENT | CUTANEOUS | Status: AC
  Filled 2012-12-18: qty 22

## 2012-12-18 MED ORDER — MIDAZOLAM HCL 5 MG/5ML IJ SOLN
INTRAMUSCULAR | Status: AC
  Filled 2012-12-18: qty 5

## 2012-12-18 MED ORDER — LIDOCAINE HCL (PF) 1 % IJ SOLN
INTRAMUSCULAR | Status: AC
  Filled 2012-12-18: qty 60

## 2012-12-18 MED ORDER — FENTANYL CITRATE 0.05 MG/ML IJ SOLN
INTRAMUSCULAR | Status: AC
  Filled 2012-12-18: qty 2

## 2012-12-18 MED ORDER — SODIUM CHLORIDE 0.9 % IV SOLN
INTRAVENOUS | Status: DC
  Administered 2012-12-18: 09:00:00 via INTRAVENOUS

## 2012-12-18 NOTE — H&P (Signed)
  ICD Criteria  Current LVEF (within 6 months):25%  NYHA Functional Classification: Class II  Heart Failure History:  Yes, Duration of heart failure since onset is > 9 months  Non-Ischemic Dilated Cardiomyopathy History:  No.  Atrial Fibrillation/Atrial Flutter:  No.  Ventricular Tachycardia History:  No.  Cardiac Arrest History:  No  History of Syndromes with Risk of Sudden Death:  No.  Previous ICD:  Yes, ICD Type:  CRT-D, Reason for ICD:  Primary prevention.  25%  Electrophysiology Study: No.  Anticoagulation Therapy: no  Beta Blocker Therapy:  Yes.   Ace Inhibitor/ARB Therapy:  Yes.

## 2012-12-18 NOTE — CV Procedure (Signed)
ICD generator removal and insertion of a new device without immediate complication. W#[id number].

## 2012-12-18 NOTE — Interval H&P Note (Signed)
History and Physical Interval Note:  12/18/2012 1:32 PM  [name]  has presented today for surgery, with the diagnosis of End of life  The various methods of treatment have been discussed with the patient and family. After consideration of risks, benefits and other options for treatment, the patient has consented to  Procedure(s): IMPLANTABLE CARDIOVERTER DEFIBRILLATOR GENERATOR CHANGE (N/A) as a surgical intervention .  The patient's history has been reviewed, patient examined, no change in status, stable for surgery.  I have reviewed the patient's chart and labs.  Questions were answered to the patient's satisfaction.     Lewayne Bunting

## 2012-12-18 NOTE — H&P (View-Only) (Signed)
HPI [name] returns today for followup. She is a very pleasant 79 year old woman with chronic systolic heart failure, left bundle branch block, status post biventricular ICD insertion, 6-1/2 years ago. In the interim she has been stable. She denies chest pain. She has class II heart failure symptoms. No syncope. No recent ICD shock. Allergies  Allergen Reactions  . Shellfish Allergy Shortness Of Breath  . Aspirin Nausea And Vomiting  . Codeine Nausea And Vomiting     Current Outpatient Prescriptions  Medication Sig Dispense Refill  . acetaminophen (TYLENOL) 325 MG tablet Take 650 mg by mouth 2 (two) times daily.       Marland Kitchen buPROPion (WELLBUTRIN SR) 150 MG 12 hr tablet Take 450 mg by mouth every morning.       . Calcium Carbonate (CALCIUM 500 PO) Take 500 mg by mouth daily.       . Cholecalciferol (VITAMIN D) 2000 UNITS CAPS Take 1 capsule by mouth daily.       . furosemide (LASIX) 40 MG tablet Take by mouth 2 (two) times daily. 40 mg in the morning and 20 mg at night      . L-Methylfolate-Algae (DEPLIN 15) 15-90.314 MG CAPS Take 15 mg by mouth every morning.       Marland Kitchen levothyroxine (SYNTHROID, LEVOTHROID) 25 MCG tablet Take 25 mcg by mouth daily before breakfast.       . metoprolol (LOPRESSOR) 50 MG tablet Take 25 mg by mouth 2 (two) times daily. takle 1/2 tablet twice daily      . olmesartan (BENICAR) 40 MG tablet Take 40 mg by mouth every morning.       Marland Kitchen omeprazole (PRILOSEC) 40 MG capsule Take 40 mg by mouth every morning.       . OXcarbazepine (TRILEPTAL) 150 MG tablet Take 150 mg by mouth every morning.       . potassium chloride SA (K-DUR,KLOR-CON) 20 MEQ tablet Take 20 mEq by mouth every morning.       Marland Kitchen spironolactone (ALDACTONE) 25 MG tablet Take 25 mg by mouth every morning.       . vitamin E 800 UNIT capsule Take 800 Units by mouth daily.        No current facility-administered medications for this visit.     Past Medical History  Diagnosis Date  . Syncope   . Systolic CHF    . Depression   . ICD (implantable cardiac defibrillator), biventricular, in situ   . LBBB (left bundle branch block)   . Dyslipidemia   . CHF (congestive heart failure)     PACEMAKER & DEFIB  . Fainted 04/21/06    AT [facility]  . Hypertension   . Arthritis   . Wears glasses   . Headache(784.0)   . GERD (gastroesophageal reflux disease)   . HLD (hyperlipidemia)   . Hypothyroidism   . Cancer     left breast cancer   . ICD (implantable cardiac defibrillator) in place     pt has pacer/icd  . Pacemaker     Guidant Device  . Complication of anesthesia     hypotensive after back surgery in 2006- reports on chart   . Nonischemic cardiomyopathy   . Normal coronary arteries     s/p cardiac cath 2007    ROS:   All systems reviewed and negative except as noted in the HPI.   Past Surgical History  Procedure Laterality Date  . Total knee arthroplasty  05/17/01    RIGHT KNEE  .  Lumbar fusion  2006  . Mastectomy, partial  2008    GOT PACEMAKER AND DEFIB AT THAT TIME  . Breast surgery  2000    LUMP REMOVAL. STAGE 1 CANCER  . Joint replacement  06/14/01    right  . Pacemaker insertion  04/23/06  . Back surgery      lumbar fusion   . Cardiac catheterization    . Mass excision  11/08/2011    Procedure: EXCISION MASS;  Surgeon: Almond Lint, MD;  Location: [facility];  Service: General;  Laterality: Left;  Excision Left Thigh Mass     Family History  Problem Relation Age of Onset  . Hypertension Mother   . Hypertension Father   . Hypertension Brother      History   Social History  . Marital Status: Married    Spouse Name: N/A    Number of Children: N/A  . Years of Education: N/A   Occupational History  . Not on file.   Social History Main Topics  . Smoking status: Never Smoker   . Smokeless tobacco: Never Used  . Alcohol Use: No  . Drug Use: No  . Sexual Activity: Not Currently    Birth Control/ Protection: Post-menopausal   Other Topics Concern  . Not on file    Social History Narrative  . No narrative on file     BP 115/71  Pulse 77  Ht 5' (1.524 m)  Wt 142 lb 9.6 oz (64.683 kg)  BMI 27.85 kg/m2  Physical Exam:  Well appearing 79 year old woman, NAD HEENT: Unremarkable Neck:  7 cm JVD, no thyromegally Back:  No CVA tenderness Lungs:  Clear with no wheezes, rales, or rhonchi. HEART:  Regular rate rhythm, no murmurs, no rubs, no clicks Abd:  soft, positive bowel sounds, no organomegally, no rebound, no guarding Ext:  2 plus pulses, no edema, no cyanosis, no clubbing Skin:  No rashes no nodules Neuro:  CN II through XII intact, motor grossly intact   DEVICE  Normal device function.  See PaceArt for details.   Assess/Plan:

## 2012-12-19 ENCOUNTER — Telehealth: Payer: Self-pay | Admitting: Internal Medicine

## 2012-12-19 NOTE — Op Note (Signed)
[name], [name]                ACCOUNT NO.:  [id number]  MEDICAL RECORD NO.:  [id number]  LOCATION:  [facility]                         FACILITY:  [facility]  PHYSICIAN:  Doylene Canning. Ladona Ridgel, MD    DATE OF BIRTH:  08/19/1933  DATE OF PROCEDURE:  12/18/2012 DATE OF DISCHARGE:  12/18/2012                              OPERATIVE REPORT   PROCEDURE PERFORMED:  Removal of a previously implanted ICD and insertion a new biventricular ICD without immediate procedure complications.  INTRODUCTION:  The patient is a very pleasant 79 year old woman with a long history of chronic systolic heart failure, severe left ventricular dysfunction, ejection fraction 25%, status post biventricular ICD insertion initially in 2008.  She is now referred for removal of her old device and insertion of a new device.  PROCEDURE:  After informed consent was obtained, the patient was taken to the diagnostic EP lab in a fasting state.  After usual preparation and draping, intravenous fentanyl and midazolam was given for sedation. 30 mL of lidocaine was infiltrated in the left infraclavicular region. A 7-cm incision was carried out over this region and electrocautery was utilized to dissect down to the ICD pocket.  The generator was removed with gentle traction.  The leads were disconnected from the old generator and the new [employer] CRT D biventricular ICD, serial [id number] was connected to the defibrillation lead and atrial and LV pacing leads and placed back in the subcutaneous pocket.  The pocket was irrigated with antibiotic irrigation.  The incision was closed with 2-0 and 3-0 Vicryl.  Benzoin and Steri-Strips were painted on the skin. Pressure dressing was applied.  The patient was returned to her room in satisfactory condition.  COMPLICATIONS:  There were no immediate procedure complications.  RESULTS:  This demonstrates successful removal of a previously implanted [employer] biventricular  ICD, which had reached elective replacement and insertion of a new biventricular ICD without immediate procedure complications.     Doylene Canning. Ladona Ridgel, MD     GWT/MEDQ  D:  12/18/2012  T:  12/19/2012  Job:  [id number]

## 2012-12-19 NOTE — Telephone Encounter (Signed)
Take the big bandage off today  It is fine to remove.  Patient verbalized understanding

## 2012-12-19 NOTE — Telephone Encounter (Signed)
New message:  Just had PM implanted on Thurs. And would like to speak to someone regarding the dressing and the pain she is in.  Please call regarding same

## 2012-12-19 NOTE — Telephone Encounter (Signed)
Left a message on voicemail to return my call. °

## 2012-12-22 ENCOUNTER — Telehealth: Payer: Self-pay | Admitting: Internal Medicine

## 2012-12-22 ENCOUNTER — Encounter: Payer: Self-pay | Admitting: Internal Medicine

## 2012-12-22 NOTE — Telephone Encounter (Signed)
New Problem  Pt recently had a pacemaker change on thurs (9/11) /// would like to know when she can begin driving

## 2012-12-22 NOTE — Telephone Encounter (Signed)
Spoke with patient and she can drive tomorrow, which will be 5 days post generator change

## 2012-12-31 ENCOUNTER — Ambulatory Visit (INDEPENDENT_AMBULATORY_CARE_PROVIDER_SITE_OTHER): Payer: Medicare Other | Admitting: *Deleted

## 2012-12-31 DIAGNOSIS — I428 Other cardiomyopathies: Secondary | ICD-10-CM

## 2012-12-31 LAB — ICD DEVICE OBSERVATION
AL AMPLITUDE: 5.4 mv
AL IMPEDENCE ICD: 411 Ohm
AL THRESHOLD: 0.7 V
ATRIAL PACING ICD: 1 pct
CHARGE TIME: 9.5 s
DEV-0020ICD: NEGATIVE
DEVICE MODEL ICD: 111765
HV IMPEDENCE: 48 Ohm
LV LEAD AMPLITUDE: 25 mv
LV LEAD IMPEDENCE ICD: 897 Ohm
LV LEAD THRESHOLD: 1 V
RV LEAD AMPLITUDE: 23.6 mv
RV LEAD IMPEDENCE ICD: 609 Ohm
RV LEAD THRESHOLD: 0.8 V
TZON-0003FASTVT: 300 ms
TZON-0003SLOWVT: 375 ms
VENTRICULAR PACING ICD: 100 pct

## 2012-12-31 NOTE — Progress Notes (Signed)
Wound check appointment. Steri-strips removed. Wound without redness or edema. Incision edges approximated, wound well healed. Normal device function. Thresholds, sensing, and impedances consistent with implant measurements. Device programmed at 3.5V for extra safety margin until 3 month visit. Histogram distribution appropriate for patient and level of activity. No mode switches or ventricular arrhythmias noted. Patient educated about wound care, arm mobility, lifting restrictions, shock plan. ROV in 3 months with GT. 

## 2013-01-02 ENCOUNTER — Encounter (HOSPITAL_COMMUNITY): Payer: Self-pay | Admitting: Emergency Medicine

## 2013-01-02 ENCOUNTER — Telehealth: Payer: Self-pay | Admitting: Internal Medicine

## 2013-01-02 ENCOUNTER — Emergency Department (HOSPITAL_COMMUNITY)
Admission: EM | Admit: 2013-01-02 | Discharge: 2013-01-02 | Disposition: A | Discharge status: Home / Self Care | Payer: Medicare Other | Attending: Emergency Medicine | Admitting: Emergency Medicine

## 2013-01-02 ENCOUNTER — Emergency Department (HOSPITAL_COMMUNITY): Payer: Medicare Other

## 2013-01-02 DIAGNOSIS — F3289 Other specified depressive episodes: Secondary | ICD-10-CM | POA: Insufficient documentation

## 2013-01-02 DIAGNOSIS — R079 Chest pain, unspecified: Secondary | ICD-10-CM

## 2013-01-02 DIAGNOSIS — Z95810 Presence of automatic (implantable) cardiac defibrillator: Secondary | ICD-10-CM | POA: Insufficient documentation

## 2013-01-02 DIAGNOSIS — Z79899 Other long term (current) drug therapy: Secondary | ICD-10-CM | POA: Insufficient documentation

## 2013-01-02 DIAGNOSIS — I10 Essential (primary) hypertension: Secondary | ICD-10-CM | POA: Insufficient documentation

## 2013-01-02 DIAGNOSIS — E039 Hypothyroidism, unspecified: Secondary | ICD-10-CM | POA: Insufficient documentation

## 2013-01-02 DIAGNOSIS — F329 Major depressive disorder, single episode, unspecified: Secondary | ICD-10-CM | POA: Insufficient documentation

## 2013-01-02 DIAGNOSIS — Z9889 Other specified postprocedural states: Secondary | ICD-10-CM | POA: Insufficient documentation

## 2013-01-02 DIAGNOSIS — K219 Gastro-esophageal reflux disease without esophagitis: Secondary | ICD-10-CM | POA: Insufficient documentation

## 2013-01-02 DIAGNOSIS — R0602 Shortness of breath: Secondary | ICD-10-CM | POA: Insufficient documentation

## 2013-01-02 DIAGNOSIS — Z853 Personal history of malignant neoplasm of breast: Secondary | ICD-10-CM | POA: Insufficient documentation

## 2013-01-02 DIAGNOSIS — M129 Arthropathy, unspecified: Secondary | ICD-10-CM | POA: Insufficient documentation

## 2013-01-02 DIAGNOSIS — I5022 Chronic systolic (congestive) heart failure: Secondary | ICD-10-CM | POA: Insufficient documentation

## 2013-01-02 LAB — HEPATIC FUNCTION PANEL
ALT: 21 U/L (ref 0–35)
AST: 24 U/L (ref 0–37)
Albumin: 3.5 g/dL (ref 3.5–5.2)
Alkaline Phosphatase: 74 U/L (ref 39–117)
Bilirubin, Direct: <0.1 mg/dL (ref 0.0–0.3)
Total Bilirubin: 0.2 mg/dL — ABNORMAL LOW (ref 0.3–1.2)
Total Protein: 7.1 g/dL (ref 6.0–8.3)

## 2013-01-02 LAB — CBC
HCT: 36 % (ref 36.0–46.0)
Hemoglobin: 12.3 g/dL (ref 12.0–15.0)
MCH: 29.1 pg (ref 26.0–34.0)
MCHC: 34.2 g/dL (ref 30.0–36.0)
MCV: 85.3 fL (ref 78.0–100.0)
Platelets: 154 10*3/uL (ref 150–400)
RBC: 4.22 MIL/uL (ref 3.87–5.11)
RDW: 14.4 % (ref 11.5–15.5)
WBC: 8.7 10*3/uL (ref 4.0–10.5)

## 2013-01-02 LAB — POCT I-STAT TROPONIN I
Troponin i, poc: 0 ng/mL (ref 0.00–0.08)
Troponin i, poc: 0 ng/mL (ref 0.00–0.08)

## 2013-01-02 LAB — BASIC METABOLIC PANEL
BUN: 25 mg/dL — ABNORMAL HIGH (ref 6–23)
CO2: 24 mEq/L (ref 19–32)
Calcium: 10 mg/dL (ref 8.4–10.5)
Chloride: 101 mEq/L (ref 96–112)
Creatinine, Ser: 0.93 mg/dL (ref 0.50–1.10)
GFR calc Af Amer: 67 mL/min — ABNORMAL LOW (ref >90)
GFR calc non Af Amer: 58 mL/min — ABNORMAL LOW (ref >90)
Glucose, Bld: 96 mg/dL (ref 70–99)
Potassium: 4.2 mEq/L (ref 3.5–5.1)
Sodium: 137 mEq/L (ref 135–145)

## 2013-01-02 LAB — PRO B NATRIURETIC PEPTIDE: Pro B Natriuretic peptide (BNP): 115.2 pg/mL (ref 0–450)

## 2013-01-02 LAB — APTT: aPTT: 30 seconds (ref 24–37)

## 2013-01-02 LAB — PROTIME-INR
INR: 1.09 (ref 0.00–1.49)
Prothrombin Time: 13.9 seconds (ref 11.6–15.2)

## 2013-01-02 MED ORDER — NITROGLYCERIN IN D5W 200-5 MCG/ML-% IV SOLN
2.0000 ug/min | Freq: Once | INTRAVENOUS | Status: AC
  Administered 2013-01-02: 10 ug/min via INTRAVENOUS
  Filled 2013-01-02: qty 250

## 2013-01-02 MED ORDER — SODIUM CHLORIDE 0.9 % IV SOLN
20.0000 mL | INTRAVENOUS | Status: DC
  Administered 2013-01-02: 20 mL via INTRAVENOUS

## 2013-01-02 MED ORDER — ASPIRIN 81 MG PO CHEW
324.0000 mg | CHEWABLE_TABLET | Freq: Once | ORAL | Status: AC
  Administered 2013-01-02: 324 mg via ORAL
  Filled 2013-01-02: qty 4

## 2013-01-02 MED ORDER — NITROGLYCERIN 0.4 MG SL SUBL
0.4000 mg | SUBLINGUAL_TABLET | SUBLINGUAL | Status: DC | PRN
Start: 2013-01-02 — End: 2013-01-02
  Administered 2013-01-02: 0.4 mg via SUBLINGUAL
  Filled 2013-01-02: qty 25

## 2013-01-02 NOTE — ED Notes (Signed)
Taken in triage

## 2013-01-02 NOTE — ED Notes (Signed)
Spoke with [employer] and to cancel the interrogation.

## 2013-01-02 NOTE — Telephone Encounter (Signed)
Received call from patient who c/o chest discomfort onset this morning when she woke up.  Patient states she recently had pacemaker change and was seen here 9/24 for pacemaker check and everything checked out fine.  She states that when she first woke up she thought the discomfort was coming from her device so she put some rubbing alcohol on the site but noted that it did not help.  Patient c/o "mild SOB"; denies n/v, diaphoresis; patient speaks clearly in complete sentences.  Patient states primary cardiologist is Dr. Katrinka Blazing at [place].  Patient states her BP has been normal.  I advised patient that since her discomfort does not seem to be device related that she should call Dr. Michaelle Copas office but that our recommendation is to go to the hospital.  Patient states she had a heart cath 6 years ago and did not have any blockages.  I advised patient that she should call Dr. Michaelle Copas office for possible evaluation or proceed to the nearest emergency department.   Patient verbalized understanding and agreement with plan of care.

## 2013-01-02 NOTE — ED Notes (Signed)
Charge Nurse attempted to interrogate pacemaker/defib. [employer]. Will call company to intergotate.

## 2013-01-02 NOTE — Consult Note (Signed)
Admit date: 01/02/2013 Referring Physician  Dr. Rosalia Hammers Primary Physician Alva Garnet., MD Primary Cardiologist  Dr. Katrinka Blazing Reason for Consultation  Chest pain  HPI: 79 year-old female with nonischemic cardiomyopathy LVEF 35%, ICD, [employer] recently changed last Thursday by Dr. Ladona Ridgel with recent interrogation on Wednesday reassuring who this morning felt poor and began to have some left-sided chest discomfort. Location, she holds her left breast and states that it is under her left breast. Began mild in severity then escalated somewhat. She took her back, eat some breakfast in her discomfort did not resolve. It did not seem to be positional or change with left arm motion. She does have some baseline shortness of breath. She is compliant with her medications. She is sitting here with her husband.  She is ventricular paced on telemetry/EKG. Unchanged. Dr. Rosalia Hammers, emergency department, began nitroglycerin drip 3 mcg. She is currently feeling well. She is laying flat comfortably. First troponin at approximately 10 AM was normal. Pain has been constant all morning.    PMH:   Past Medical History  Diagnosis Date  . Syncope   . Systolic CHF   . Depression   . ICD (implantable cardiac defibrillator), biventricular, in situ   . LBBB (left bundle branch block)   . Dyslipidemia   . CHF (congestive heart failure)     PACEMAKER & DEFIB  . Fainted 04/21/06    AT [facility]  . Hypertension   . Arthritis   . Wears glasses   . Headache(784.0)   . GERD (gastroesophageal reflux disease)   . HLD (hyperlipidemia)   . Hypothyroidism   . Cancer     left breast cancer   . ICD (implantable cardiac defibrillator) in place     pt has pacer/icd  . Pacemaker     Guidant Device  . Complication of anesthesia     hypotensive after back surgery in 2006- reports on chart   . Nonischemic cardiomyopathy   . Normal coronary arteries     s/p cardiac cath 2007    PSH:   Past Surgical History  Procedure  Laterality Date  . Total knee arthroplasty  05/17/01    RIGHT KNEE  . Lumbar fusion  2006  . Mastectomy, partial  2008    GOT PACEMAKER AND DEFIB AT THAT TIME  . Breast surgery  2000    LUMP REMOVAL. STAGE 1 CANCER  . Joint replacement  06/14/01    right  . Pacemaker insertion  04/23/06  . Back surgery      lumbar fusion   . Cardiac catheterization    . Mass excision  11/08/2011    Procedure: EXCISION MASS;  Surgeon: Almond Lint, MD;  Location: [facility];  Service: General;  Laterality: Left;  Excision Left Thigh Mass   Allergies:  Shellfish allergy; Aspirin; and Codeine Prior to Admit Meds:   Prior to Admission medications   Medication Sig Start Date End Date Taking? Authorizing Provider  acetaminophen (TYLENOL) 325 MG tablet Take 650 mg by mouth 2 (two) times daily.    Yes Historical Provider, MD  buPROPion (WELLBUTRIN XL) 150 MG 24 hr tablet Take 150 mg by mouth daily.   Yes Historical Provider, MD  Calcium Carbonate (CALCIUM 500 PO) Take 500 mg by mouth daily.    Yes Historical Provider, MD  Cholecalciferol (VITAMIN D) 2000 UNITS CAPS Take 1 capsule by mouth daily.    Yes Historical Provider, MD  furosemide (LASIX) 40 MG tablet Take 20-40 mg by mouth 2 (two)  times daily. 40 mg in the morning and 20 mg at night   Yes Historical Provider, MD  l-methylfolate-B6-B12 (METANX) 3-35-2 MG TABS Take 1 tablet by mouth daily.   Yes Historical Provider, MD  levothyroxine (SYNTHROID, LEVOTHROID) 25 MCG tablet Take 25 mcg by mouth daily before breakfast.    Yes Historical Provider, MD  metoprolol (LOPRESSOR) 50 MG tablet Take 25 mg by mouth 2 (two) times daily. takle 1/2 tablet twice daily 08/09/11  Yes Marinus Maw, MD  mirtazapine (REMERON) 15 MG tablet Take 15 mg by mouth at bedtime.   Yes Historical Provider, MD  olmesartan (BENICAR) 40 MG tablet Take 40 mg by mouth every morning.    Yes Historical Provider, MD  omeprazole (PRILOSEC) 40 MG capsule Take 40 mg by mouth every morning.    Yes Historical  Provider, MD  potassium chloride (K-DUR,KLOR-CON) 10 MEQ tablet Take 10 mEq by mouth daily.   Yes Historical Provider, MD  sertraline (ZOLOFT) 100 MG tablet Take 100 mg by mouth daily.   Yes Historical Provider, MD  spironolactone (ALDACTONE) 25 MG tablet Take 25 mg by mouth every morning.  05/03/11  Yes Historical Provider, MD  vitamin E 800 UNIT capsule Take 800 Units by mouth daily.    Yes Historical Provider, MD   Fam HX:    Family History  Problem Relation Age of Onset  . Hypertension Mother   . Hypertension Father   . Hypertension Brother    Social HX:    History   Social History  . Marital Status: Married    Spouse Name: N/A    Number of Children: N/A  . Years of Education: N/A   Occupational History  . Not on file.   Social History Main Topics  . Smoking status: Never Smoker   . Smokeless tobacco: Never Used  . Alcohol Use: No  . Drug Use: No  . Sexual Activity: Not Currently    Birth Control/ Protection: Post-menopausal   Other Topics Concern  . Not on file   Social History Narrative  . No narrative on file     ROS: Denies any rash, fevers, cough, chills, strokelike symptoms, orthopnea, PND, bleeding All 11 ROS were addressed and are negative except what is stated in the HPI   Physical Exam: Blood pressure 104/53, pulse 59, temperature 97.7 F (36.5 C), temperature source Oral, resp. rate 16, height 5' (1.524 m), weight 63.504 kg (140 lb), SpO2 98.00%.   General: Well developed, well nourished, in no acute distress Head: Eyes PERRLA, No xanthomas.   Normal cephalic and atramatic  Lungs:   Clear bilaterally to auscultation and percussion. Normal respiratory effort. No wheezes, no rales. Heart:   HRRR S1 S2 Pulses are 2+ & equal. No murmurs, rubs, gallops            No carotid bruit. No JVD.  No abdominal bruits. Pacemaker site healing well, mild edema. No ecchymosis. Abdomen: Bowel sounds are positive, abdomen soft and non-tender without masses. No  hepatosplenomegaly. Msk:  Back normal. Normal strength and tone for age. Extremities:  No clubbing, cyanosis or edema.  DP +1 Neuro: Alert and oriented X 3, non-focal, MAE x 4 GU: Deferred Rectal: Deferred Psych:  Good affect, responds appropriately      Labs: Lab Results  Component Value Date   WBC 8.7 01/02/2013   HGB 12.3 01/02/2013   HCT 36.0 01/02/2013   MCV 85.3 01/02/2013   PLT 154 01/02/2013    Recent Labs Lab  01/02/13 1040  NA 137  K 4.2  CL 101  CO2 24  BUN 25*  CREATININE 0.93  CALCIUM 10.0  PROT 7.1  BILITOT 0.2*  ALKPHOS 74  ALT 21  AST 24  GLUCOSE 96   No results found for this basename: CKTOTAL, CKMB, TROPONINI,  in the last 72 hours Lab Results  Component Value Date   CHOL  Value: 101        ATP III CLASSIFICATION:  <200     mg/dL   Desirable  478-295  mg/dL   Borderline High  >=621    mg/dL   High        06/14/6576   HDL 32* 01/02/2010   LDLCALC  Value: 39        Total Cholesterol/HDL:CHD Risk Coronary Heart Disease Risk Table                     Men   Women  1/2 Average Risk   3.4   3.3  Average Risk       5.0   4.4  2 X Average Risk   9.6   7.1  3 X Average Risk  23.4   11.0        Use the calculated Patient Ratio above and the CHD Risk Table to determine the patient's CHD Risk.        ATP III CLASSIFICATION (LDL):  <100     mg/dL   Optimal  469-629  mg/dL   Near or Above                    Optimal  130-159  mg/dL   Borderline  528-413  mg/dL   High  >244     mg/dL   Very High 0/01/2724   TRIG 148 01/02/2010   No results found for this basename: DDIMER     Radiology:  Dg Chest Port 1 View  01/02/2013   CLINICAL DATA:  Left chest pain and pressure. Shortness of breath.  EXAM: PORTABLE CHEST - 1 VIEW  COMPARISON:  10/31/2011  FINDINGS: Pacer/defibrillator remains in place.  The lungs appear clear. The cardiac and mediastinal contours normal. No pleural effusion identified.  IMPRESSION: No significant abnormality identified.   Electronically Signed   By: Herbie Baltimore   On: 01/02/2013 11:37   Personally viewed.  EKG:  Ventricular paced, unchanged, no ST segment changes. Personally viewed.  Echocardiogram: 2011-EF 35%. She has normal coronary arteries by cardiac catheterization in 2007  ASSESSMENT/PLAN:   79 year old female with nonischemic cardiomyopathy, recent ICD change out, hypertension, chronic systolic heart failure here with atypical chest pain.  -I discussed with her the possibility of observation overnight and she respectfully declined. -Is possible that her discomfort is musculoskeletal especially after recent ICD change. She's not had any fevers, cough, nausea, vomiting. Her lab work is unremarkable. -We will stop low-dose nitroglycerin drip. -Continue to encourage proton pump inhibitor. Prilosec is on her medication list. -I stated that we would obtain another point-of-care troponin. If this point-of-care troponin is normal, given the length of time of her atypical chest discomfort, I'm comfortable allowing her to be discharged home. -I have described plan to she and her husband as well as nursing. -Given her prior history of nonischemic cardio myopathy with cardiac catheterization in 2007 showing no evidence of coronary artery disease it is unlikely that her discomfort currently is CAD related.  -We will have close followup with Dr. Katrinka Blazing in clinic.  Donato Schultz, MD  01/02/2013  1:08 PM

## 2013-01-02 NOTE — ED Notes (Addendum)
Spoke with [employer] from [employer]. States he just got page. Informed Cardiology just walked into room. States he will be done in a little while to see pt

## 2013-01-02 NOTE — ED Notes (Signed)
Cardiology at bedside.

## 2013-01-02 NOTE — ED Notes (Signed)
Lab called stated have to reorder and recollect urine specimen.

## 2013-01-02 NOTE — ED Notes (Signed)
Doctor notified patient vital signs and chest pain 8/10 pressure decreased to 6/10 pressure momentarily and increased to 7/10 pressure.

## 2013-01-02 NOTE — ED Notes (Signed)
Spoke with Dr Anne Fu states to cancel [facility] and urinalysis. Will discharge patient if second troponin is negative.

## 2013-01-02 NOTE — ED Notes (Signed)
Onset today left side chest pain with pain around pacemaker site.  States pain constant increasing to 7/10 pressure with short of breath at rest.

## 2013-01-02 NOTE — ED Provider Notes (Addendum)
CSN: [id number]     Arrival date & time 01/02/13  1010 History   First MD Initiated Contact with Patient 01/02/13 1030     Chief Complaint  Patient presents with  . Chest Pain  . AICD Problem   (Consider location/radiation/quality/duration/timing/severity/associated sxs/prior Treatment) Patient is a 79 y.o. female presenting with chest pain.  Chest Pain  79 year old female with a history of nonischemic cardiomyopathy who presents today stating that she is having left sided chest pain that radiates around to her back. This began at about 7 AM. It is pressure in nature. It is associated with shortness of breath. She has not had similar pain in the past.  She currently has pain that she rates it an 8/10. She can describe no increasing or decreasing factors. She has not taken anything for this. She did have  an internal defibrillator placed recently. She has not noted any firing of this or pain associated  she acknowledges a weight gain of about 2 pounds with some increased peripheral edema  she has been taking her medication as prescribed. She denies any history of angina or myocardial infarction. Her primary care Dr. is Andi Devon and her cardiologist is Garnette Scheuermann. Past Medical History  Diagnosis Date  . Syncope   . Systolic CHF   . Depression   . ICD (implantable cardiac defibrillator), biventricular, in situ   . LBBB (left bundle branch block)   . Dyslipidemia   . CHF (congestive heart failure)     PACEMAKER & DEFIB  . Fainted 04/21/06    AT [facility]  . Hypertension   . Arthritis   . Wears glasses   . Headache(784.0)   . GERD (gastroesophageal reflux disease)   . HLD (hyperlipidemia)   . Hypothyroidism   . Cancer     left breast cancer   . ICD (implantable cardiac defibrillator) in place     pt has pacer/icd  . Pacemaker     Guidant Device  . Complication of anesthesia     hypotensive after back surgery in 2006- reports on chart   . Nonischemic cardiomyopathy   . Normal  coronary arteries     s/p cardiac cath 2007   Past Surgical History  Procedure Laterality Date  . Total knee arthroplasty  05/17/01    RIGHT KNEE  . Lumbar fusion  2006  . Mastectomy, partial  2008    GOT PACEMAKER AND DEFIB AT THAT TIME  . Breast surgery  2000    LUMP REMOVAL. STAGE 1 CANCER  . Joint replacement  06/14/01    right  . Pacemaker insertion  04/23/06  . Back surgery      lumbar fusion   . Cardiac catheterization    . Mass excision  11/08/2011    Procedure: EXCISION MASS;  Surgeon: Almond Lint, MD;  Location: [facility];  Service: General;  Laterality: Left;  Excision Left Thigh Mass   Family History  Problem Relation Age of Onset  . Hypertension Mother   . Hypertension Father   . Hypertension Brother    History  Substance Use Topics  . Smoking status: Never Smoker   . Smokeless tobacco: Never Used  . Alcohol Use: No   OB History   Grav Para Term Preterm Abortions TAB SAB Ect Mult Living                 Review of Systems  Cardiovascular: Positive for chest pain.  All other systems reviewed and are negative.  Allergies  Shellfish allergy; Aspirin; and Codeine  Home Medications   Current Outpatient Rx  Name  Route  Sig  Dispense  Refill  . acetaminophen (TYLENOL) 325 MG tablet   Oral   Take 650 mg by mouth 2 (two) times daily.          Marland Kitchen buPROPion (WELLBUTRIN XL) 150 MG 24 hr tablet   Oral   Take 150 mg by mouth daily.         . Calcium Carbonate (CALCIUM 500 PO)   Oral   Take 500 mg by mouth daily.          . Cholecalciferol (VITAMIN D) 2000 UNITS CAPS   Oral   Take 1 capsule by mouth daily.          . furosemide (LASIX) 40 MG tablet   Oral   Take 20-40 mg by mouth 2 (two) times daily. 40 mg in the morning and 20 mg at night         . l-methylfolate-B6-B12 (METANX) 3-35-2 MG TABS   Oral   Take 1 tablet by mouth daily.         Marland Kitchen levothyroxine (SYNTHROID, LEVOTHROID) 25 MCG tablet   Oral   Take 25 mcg by mouth daily before  breakfast.          . metoprolol (LOPRESSOR) 50 MG tablet   Oral   Take 25 mg by mouth 2 (two) times daily. takle 1/2 tablet twice daily         . mirtazapine (REMERON) 15 MG tablet   Oral   Take 15 mg by mouth at bedtime.         Marland Kitchen olmesartan (BENICAR) 40 MG tablet   Oral   Take 40 mg by mouth every morning.          Marland Kitchen omeprazole (PRILOSEC) 40 MG capsule   Oral   Take 40 mg by mouth every morning.          . potassium chloride (K-DUR,KLOR-CON) 10 MEQ tablet   Oral   Take 10 mEq by mouth daily.         . sertraline (ZOLOFT) 100 MG tablet   Oral   Take 100 mg by mouth daily.         Marland Kitchen spironolactone (ALDACTONE) 25 MG tablet   Oral   Take 25 mg by mouth every morning.          . vitamin E 800 UNIT capsule   Oral   Take 800 Units by mouth daily.           BP 110/65  Pulse 57  Temp(Src) 97.7 F (36.5 C) (Oral)  Resp 18  Ht 5' (1.524 m)  Wt 140 lb (63.504 kg)  BMI 27.34 kg/m2  SpO2 100% Physical Exam  Nursing note and vitals reviewed. Constitutional: She is oriented to person, place, and time. She appears well-developed and well-nourished.  HENT:  Head: Normocephalic and atraumatic.  Right Ear: External ear normal.  Left Ear: External ear normal.  Nose: Nose normal.  Mouth/Throat: Oropharynx is clear and moist.  Eyes: Conjunctivae and EOM are normal. Pupils are equal, round, and reactive to light.  Neck: Normal range of motion.  Cardiovascular: Normal rate, regular rhythm and normal heart sounds.   Pulmonary/Chest: Effort normal and breath sounds normal.  Abdominal: Soft. Bowel sounds are normal.  Musculoskeletal: She exhibits edema and tenderness.  Diffuse edema of lower legs 2+ with some diffuse tenderness to palpation no  calf tenderness is noted specifically  Neurological: She is alert and oriented to person, place, and time. She has normal reflexes.  Skin: Skin is warm and dry.  Psychiatric: She has a normal mood and affect. Her behavior  is normal. Judgment and thought content normal.    ED Course  Procedures (including critical care time) Labs Review Labs Reviewed  BASIC METABOLIC PANEL - Abnormal; Notable for the following:    BUN 25 (*)    GFR calc non Af Amer 58 (*)    GFR calc Af Amer 67 (*)    All other components within normal limits  HEPATIC FUNCTION PANEL - Abnormal; Notable for the following:    Total Bilirubin 0.2 (*)    All other components within normal limits  CBC  PRO B NATRIURETIC PEPTIDE  PROTIME-INR  APTT  URINALYSIS, ROUTINE W REFLEX MICROSCOPIC  POCT I-STAT TROPONIN I   Imaging Review Dg Chest Port 1 View  01/02/2013   CLINICAL DATA:  Left chest pain and pressure. Shortness of breath.  EXAM: PORTABLE CHEST - 1 VIEW  COMPARISON:  10/31/2011  FINDINGS: Pacer/defibrillator remains in place.  The lungs appear clear. The cardiac and mediastinal contours normal. No pleural effusion identified.  IMPRESSION: No significant abnormality identified.   Electronically Signed   By: Herbie Baltimore   On: 01/02/2013 11:37    Date: 01/02/2013  Rate: 67  Rhythm: normal sinus rhythm  QRS Axis: left  Intervals: normal  ST/T Wave abnormalities: nonspecific ST/T changes  Conduction Disutrbances:none  Narrative Interpretation:   Old EKG Reviewed: unchanged  Results for orders placed during the hospital encounter of 01/02/13  CBC      Result Value Range   WBC 8.7  4.0 - 10.5 K/uL   RBC 4.22  3.87 - 5.11 MIL/uL   Hemoglobin 12.3  12.0 - 15.0 g/dL   HCT 16.1  09.6 - 04.5 %   MCV 85.3  78.0 - 100.0 fL   MCH 29.1  26.0 - 34.0 pg   MCHC 34.2  30.0 - 36.0 g/dL   RDW 40.9  81.1 - 91.4 %   Platelets 154  150 - 400 K/uL  BASIC METABOLIC PANEL      Result Value Range   Sodium 137  135 - 145 mEq/L   Potassium 4.2  3.5 - 5.1 mEq/L   Chloride 101  96 - 112 mEq/L   CO2 24  19 - 32 mEq/L   Glucose, Bld 96  70 - 99 mg/dL   BUN 25 (*) 6 - 23 mg/dL   Creatinine, Ser 7.82  0.50 - 1.10 mg/dL   Calcium 95.6  8.4 -  10.5 mg/dL   GFR calc non Af Amer 58 (*) >90 mL/min   GFR calc Af Amer 67 (*) >90 mL/min  PRO B NATRIURETIC PEPTIDE      Result Value Range   Pro B Natriuretic peptide (BNP) 115.2  0 - 450 pg/mL  PROTIME-INR      Result Value Range   Prothrombin Time 13.9  11.6 - 15.2 seconds   INR 1.09  0.00 - 1.49  APTT      Result Value Range   aPTT 30  24 - 37 seconds  HEPATIC FUNCTION PANEL      Result Value Range   Total Protein 7.1  6.0 - 8.3 g/dL   Albumin 3.5  3.5 - 5.2 g/dL   AST 24  0 - 37 U/L   ALT 21  0 -  35 U/L   Alkaline Phosphatase 74  39 - 117 U/L   Total Bilirubin 0.2 (*) 0.3 - 1.2 mg/dL   Bilirubin, Direct <5.4  0.0 - 0.3 mg/dL   Indirect Bilirubin NOT CALCULATED  0.3 - 0.9 mg/dL  POCT I-STAT TROPONIN I      Result Value Range   Troponin i, poc 0.00  0.00 - 0.08 ng/mL   Comment 3            Dg Chest Port 1 View  01/02/2013   CLINICAL DATA:  Left chest pain and pressure. Shortness of breath.  EXAM: PORTABLE CHEST - 1 VIEW  COMPARISON:  10/31/2011  FINDINGS: Pacer/defibrillator remains in place.  The lungs appear clear. The cardiac and mediastinal contours normal. No pleural effusion identified.  IMPRESSION: No significant abnormality identified.   Electronically Signed   By: Herbie Baltimore   On: 01/02/2013 11:37   MDM  No diagnosis found. Ms. Wirtz is a 79 year old lady with hypertension and congestive heart failure status post internal defibrillator placement who presents today with chest pain. EKG is unchanged from prior and troponins are normal. The patient received aspirin here in the emergency department as well as nitroglycerin with pain decreased from an 8-86 with the first sublingual nitroglycerin. She is placed on IV nitroglycerin for pain control. Cardiology is being consulted. Her last catheterization was 2007 and it was reported that she had normal coronary arteries at that time although I have been unable to view the report. Discussed with Dr. Anne Fu and they will  see and evaluate.   Hilario Quarry, MD 01/02/13 0981  Hilario Quarry, MD 01/02/13 1332  Addendum please see Dr. Anne Fu full note. Repeat point-of-care troponins is normal.  The patients nurse will contact Dr. Chales Abrahams regarding disc discharge instructions and provide them to the patient.  Hilario Quarry, MD 01/02/13 [phone]

## 2013-01-02 NOTE — Telephone Encounter (Signed)
New problem    C/O pressure in chest pain & sob.  Pain level  7-8.  S/p recent pacmaker

## 2013-01-07 ENCOUNTER — Telehealth: Payer: Self-pay | Admitting: Interventional Cardiology

## 2013-01-07 NOTE — Telephone Encounter (Signed)
New Problem:  Pt states she was recently in the hospital and she would like to know if it's ok for [place]. Please advise

## 2013-01-07 NOTE — Telephone Encounter (Signed)
Spoke with patient who would like permission [place] with [place] church on [place] leaving Friday morning and returning Saturday evening.  Patient states she will have access to telephone and there will be some nurses in attendance as well.  Patient states she will travel by car.  I advised patient that I will discuss with Dr. Katrinka Blazing who is in the office today and will call her back.  Patient verbalized agreement with plan.

## 2013-01-07 NOTE — Telephone Encounter (Signed)
Spoke with patient to advise her that Dr. Katrinka Blazing does not see any problem with her traveling this weekend.  I encouraged patient to take all of her medications as directed and to enjoy her trip but not to overexert herself.  Patient verbalized understanding and gratitude.

## 2013-01-12 ENCOUNTER — Ambulatory Visit (INDEPENDENT_AMBULATORY_CARE_PROVIDER_SITE_OTHER): Payer: Medicare Other | Admitting: *Deleted

## 2013-01-12 DIAGNOSIS — I428 Other cardiomyopathies: Secondary | ICD-10-CM

## 2013-01-12 NOTE — Progress Notes (Signed)
Instructions on how to hook up transmitter/kwm

## 2013-01-16 ENCOUNTER — Encounter: Payer: Self-pay | Admitting: Internal Medicine

## 2013-02-03 ENCOUNTER — Encounter: Payer: Medicare Other | Admitting: Internal Medicine

## 2013-02-10 ENCOUNTER — Telehealth: Payer: Self-pay | Admitting: Internal Medicine

## 2013-02-10 ENCOUNTER — Ambulatory Visit (INDEPENDENT_AMBULATORY_CARE_PROVIDER_SITE_OTHER): Payer: Medicare Other | Admitting: *Deleted

## 2013-02-10 DIAGNOSIS — Z95810 Presence of automatic (implantable) cardiac defibrillator: Secondary | ICD-10-CM

## 2013-02-10 LAB — ICD DEVICE OBSERVATION
AL AMPLITUDE: 5.1 mv
AL IMPEDENCE ICD: 415 Ohm
AL THRESHOLD: 0.6 V
ATRIAL PACING ICD: 1 pct
CHARGE TIME: 9.5 s
DEV-0020ICD: NEGATIVE
DEVICE MODEL ICD: 111765
HV IMPEDENCE: 48 Ohm
LV LEAD AMPLITUDE: 25 mv
LV LEAD IMPEDENCE ICD: 909 Ohm
LV LEAD THRESHOLD: 0.9 V
RV LEAD AMPLITUDE: 20.8 mv
RV LEAD IMPEDENCE ICD: 612 Ohm
RV LEAD THRESHOLD: 0.8 V
TZON-0003FASTVT: 300 ms
TZON-0003SLOWVT: 375 ms
VENTRICULAR PACING ICD: 100 pct

## 2013-02-10 NOTE — Telephone Encounter (Signed)
Pt has [employer], needs DSL filter. She will come in today to pick one up. While here, we will interrogate her device to inspect for episodes related to her dizzy symptoms.

## 2013-02-10 NOTE — Progress Notes (Signed)
Pt c/o of recent dizzy spells; could not send remote transmission b/c she needed DSL filter (gave her one today).  Interrogation revealed no mode switches or NSVT. Changed VF initial detection from 1.0sec to 2.5sec, VT & VT-1 initial detections both from 2.5sec to 5.0sec.  Pt will contact her PCP to investigate if spells were related to her vertigo diagnosis.  ROV w/ Dr. Ladona Ridgel 03/24/13.

## 2013-02-10 NOTE — Telephone Encounter (Signed)
Spoke with patient who states in church on Sunday that she became dizzy and saw spots before eyes.  Patient states she sat and prayed and got better - did not pass out.  Patient states when she went to get up from her seat, she felt a "whoozy" feeling and she had someone assist her to the bathroom.  Patient states after sitting quietly in the bathroom for a while, she felt better.  Patient states she has not had that feeling again until this morning.  Patient states she was dizzy when she woke up this morning after sleeping well through the night and she continues to have "spells" of dizziness and nausea.  Patient denies SOB; denies issues with her heart rate being too slow or too fast.  Patient c/o slight ache under the site of the pacemaker.  Patient states she took Meclizine for vertigo approximately 1/2 hour ago - states she cannot tell a difference yet.

## 2013-02-10 NOTE — Telephone Encounter (Signed)
New problem  Since patient got new pacmaker in October, she is now having dizzy spells since Sunday. Please advice.

## 2013-02-10 NOTE — Telephone Encounter (Signed)
I reviewed patient's symptoms with Dennis Bast, RN, Dr. Lubertha Basque primary nurse and she advised patient send a transmission from her Guidant pacemaker for review.  If pacemaker does not show any activity that could cause her dizziness, patient is to call her PCP.  I reviewed plan with patient who states she does not know how to send a transmission.  I am routing message to device clinic to assist patient with transmission.  Patient verbalized understanding.

## 2013-02-18 ENCOUNTER — Encounter: Payer: Self-pay | Admitting: Internal Medicine

## 2013-02-23 ENCOUNTER — Ambulatory Visit (INDEPENDENT_AMBULATORY_CARE_PROVIDER_SITE_OTHER): Payer: Medicare Other | Admitting: Family Medicine

## 2013-02-23 VITALS — BP 122/76 | HR 100 | Temp 98.7°F | Resp 16 | Ht 59.75 in | Wt 143.0 lb

## 2013-02-23 DIAGNOSIS — R1013 Epigastric pain: Secondary | ICD-10-CM

## 2013-02-23 DIAGNOSIS — R350 Frequency of micturition: Secondary | ICD-10-CM

## 2013-02-23 DIAGNOSIS — M545 Low back pain, unspecified: Secondary | ICD-10-CM

## 2013-02-23 DIAGNOSIS — R1012 Left upper quadrant pain: Secondary | ICD-10-CM

## 2013-02-23 LAB — POCT CBC
Granulocyte percent: 55.6 %G (ref 37–80)
HCT, POC: 42.8 % (ref 37.7–47.9)
Hemoglobin: 13.2 g/dL (ref 12.2–16.2)
Lymph, poc: 3.7 — AB (ref 0.6–3.4)
MCH, POC: 28.1 pg (ref 27–31.2)
MCHC: 30.8 g/dL — AB (ref 31.8–35.4)
MCV: 91.2 fL (ref 80–97)
MID (cbc): 0.8 (ref 0–0.9)
MPV: 8.4 fL (ref 0–99.8)
POC Granulocyte: 5.6 (ref 2–6.9)
POC LYMPH PERCENT: 36.5 %L (ref 10–50)
POC MID %: 7.9 %M (ref 0–12)
Platelet Count, POC: 174 10*3/uL (ref 142–424)
RBC: 4.69 M/uL (ref 4.04–5.48)
RDW, POC: 15.8 %
WBC: 10.1 10*3/uL (ref 4.6–10.2)

## 2013-02-23 LAB — POCT URINALYSIS DIPSTICK
Bilirubin, UA: NEGATIVE
Glucose, UA: NEGATIVE
Ketones, UA: NEGATIVE
Leukocytes, UA: NEGATIVE
Nitrite, UA: NEGATIVE
Protein, UA: NEGATIVE
Spec Grav, UA: 1.02
Urobilinogen, UA: 0.2
pH, UA: 5.5

## 2013-02-23 LAB — POCT UA - MICROSCOPIC ONLY
Bacteria, U Microscopic: NEGATIVE
Casts, Ur, LPF, POC: NEGATIVE
Crystals, Ur, HPF, POC: NEGATIVE
Mucus, UA: NEGATIVE
Yeast, UA: NEGATIVE

## 2013-02-23 NOTE — Progress Notes (Signed)
Subjective: 79 year old lady who is here with a several day history of bad odor to urine. She's had a low back pain in both flanks. Knows of no specific cause. Does not know anything that she ate that might have made a bad odor in the urine. She is generally a fairly healthy lady. She has had some back pain for a long time and can't do such things as mopping.  Objective: Pleasant elderly lady in no major distress. Chest is clear. Bilateral CVA tenderness. Not much tenderness in the midline of the spine. Abdomen soft but is tender in the epigastric region. No low abdominal tenderness. No blood in stool or urine.  Assessment: Bilateral back pain Abnormal urinary odor Upper abdominal pain  Plan: Check CBC and urinalysis Results for orders placed in visit on 02/23/13  POCT URINALYSIS DIPSTICK      Result Value Range   Color, UA yellow     Clarity, UA clear     Glucose, UA neg     Bilirubin, UA neg     Ketones, UA neg     Spec Grav, UA 1.020     Blood, UA tr-intact     pH, UA 5.5     Protein, UA neg     Urobilinogen, UA 0.2     Nitrite, UA neg     Leukocytes, UA Negative    POCT CBC      Result Value Range   WBC 10.1  4.6 - 10.2 K/uL   Lymph, poc 3.7 (*) 0.6 - 3.4   POC LYMPH PERCENT 36.5  10 - 50 %L   MID (cbc) 0.8  0 - 0.9   POC MID % 7.9  0 - 12 %M   POC Granulocyte 5.6  2 - 6.9   Granulocyte percent 55.6  37 - 80 %G   RBC 4.69  4.04 - 5.48 M/uL   Hemoglobin 13.2  12.2 - 16.2 g/dL   HCT, POC 16.1  09.6 - 47.9 %   MCV 91.2  80 - 97 fL   MCH, POC 28.1  27 - 31.2 pg   MCHC 30.8 (*) 31.8 - 35.4 g/dL   RDW, POC 04.5     Platelet Count, POC 174  142 - 424 K/uL   MPV 8.4  0 - 99.8 fL  POCT UA - MICROSCOPIC ONLY      Result Value Range   WBC, Ur, HPF, POC 0-1     RBC, urine, microscopic 1-3     Bacteria, U Microscopic neg     Mucus, UA neg     Epithelial cells, urine per micros 0-1     Crystals, Ur, HPF, POC neg     Casts, Ur, LPF, POC neg     Yeast, UA neg     nonspecific low back strain  Discussed treatment options and decided to minimize medical therapy. If she is getting worse at anytime she is to return

## 2013-02-23 NOTE — Patient Instructions (Signed)
In the event of severe  abnormal or back pain, bleeding in stool or urine, prolonged vomiting, fever, or other major symptoms you should go to the emergency room or come back in for recheck.  Take Tylenol for the back pain.  Alternate heat and ice, or heat alone, to see if that will help your back.  Return if worse

## 2013-02-24 ENCOUNTER — Encounter (INDEPENDENT_AMBULATORY_CARE_PROVIDER_SITE_OTHER): Payer: Self-pay | Admitting: General Surgery

## 2013-02-24 ENCOUNTER — Ambulatory Visit (INDEPENDENT_AMBULATORY_CARE_PROVIDER_SITE_OTHER): Payer: 59 | Admitting: General Surgery

## 2013-02-24 ENCOUNTER — Ambulatory Visit (INDEPENDENT_AMBULATORY_CARE_PROVIDER_SITE_OTHER): Payer: Medicare Other | Admitting: General Surgery

## 2013-02-24 VITALS — BP 110/68 | HR 70 | Temp 97.0°F | Resp 14 | Ht 60.0 in | Wt 140.6 lb

## 2013-02-24 DIAGNOSIS — Z853 Personal history of malignant neoplasm of breast: Secondary | ICD-10-CM

## 2013-02-24 DIAGNOSIS — N644 Mastodynia: Secondary | ICD-10-CM

## 2013-02-24 NOTE — Patient Instructions (Signed)
Follow up in 1 year.    Please have them send me copy of mammogram.    Have good holidays!!!

## 2013-03-02 NOTE — Progress Notes (Signed)
HISTORY: Patient is a 79 year old female who I have been following for breast cancer. She had left-sided breast conservation surgery with Dr. Lurene Shadow in 2008. She had significant pain from chronic seroma. We tried aspirating this multiple times and eventually did the surgical excision for the chronic seroma cavity.  The skin was freed up from the chest wall, however because of the radiation changes and inflammation, it adhered back to the chest wall.    I also removed a lipoma from her upper inner thigh last year.   Her breast pain remains better.  She is having much less tenderness.  She denies any new breast complaints.  She is not sure when she last had mammogram, but thinks it was within the year.    PERTINENT REVIEW OF SYSTEMS: Otherwise negative.     EXAM: Head: Normocephalic and atraumatic.  Eyes:  Conjunctivae are normal. Pupils are equal, round, and reactive to light. No scleral icterus.  Neck:  Normal range of motion. Neck supple. No tracheal deviation present. No thyromegaly present.  Breast:  Right breast without masses, skin dimpling, nipple retraction, lymphadenopathy.  Left side with chronic changes that are stable.  Scarring feels softer. The contour remains distorted.    Resp: No respiratory distress, normal effort. Abd:  Abdomen is soft, non distended and non tender. No masses are palpable.  There is no rebound and no guarding.  Neurological: Alert and oriented to person, place, and time. Coordination normal.  Skin: Skin is warm and dry. No rash noted. No diaphoretic. No erythema. No pallor.  Psychiatric: Normal mood and affect. Normal behavior. Judgment and thought content normal.  Extremities:  Thigh wound well healed.  No recurrent seroma.        ASSESSMENT AND PLAN:   Chronic L breast pain with chronic recurrent seroma, s/p excisional biopsy 01/12/2010 Continues to improve.    History of breast cancer [id number], s/p BCT 2008, triple negative No clinical evidence of  disease.    Pt will need to get mammogram.        Maudry Diego, MD Surgical Oncology, General & Endocrine Surgery [facility]  Alva Garnet., MD Alva Garnet., MD

## 2013-03-02 NOTE — Assessment & Plan Note (Signed)
Continues to improve 

## 2013-03-02 NOTE — Assessment & Plan Note (Signed)
No clinical evidence of disease. 

## 2013-03-09 ENCOUNTER — Telehealth: Payer: Self-pay | Admitting: *Deleted

## 2013-03-09 MED ORDER — METOPROLOL TARTRATE 50 MG PO TABS: 25.0000 mg | ORAL_TABLET | Freq: Two times a day (BID) | ORAL | Status: DC

## 2013-03-09 NOTE — Telephone Encounter (Signed)
Patient needs refill on metoprolol tart 50mg  bid. Is this the dose that Dr. Katrinka Blazing has her on? Please advise. Thanks, MI

## 2013-03-09 NOTE — Telephone Encounter (Signed)
Done

## 2013-03-23 ENCOUNTER — Encounter (INDEPENDENT_AMBULATORY_CARE_PROVIDER_SITE_OTHER): Payer: Self-pay

## 2013-03-24 ENCOUNTER — Encounter: Payer: Medicare Other | Admitting: Internal Medicine

## 2013-03-26 ENCOUNTER — Encounter: Payer: Self-pay | Admitting: Internal Medicine

## 2013-04-01 ENCOUNTER — Encounter (INDEPENDENT_AMBULATORY_CARE_PROVIDER_SITE_OTHER): Payer: Self-pay

## 2013-04-28 ENCOUNTER — Encounter: Payer: Self-pay | Admitting: Internal Medicine

## 2013-04-28 ENCOUNTER — Ambulatory Visit (INDEPENDENT_AMBULATORY_CARE_PROVIDER_SITE_OTHER): Payer: Medicare Other | Admitting: Internal Medicine

## 2013-04-28 VITALS — BP 120/68 | HR 69 | Ht 60.0 in | Wt 136.0 lb

## 2013-04-28 DIAGNOSIS — I5022 Chronic systolic (congestive) heart failure: Secondary | ICD-10-CM

## 2013-04-28 DIAGNOSIS — I10 Essential (primary) hypertension: Secondary | ICD-10-CM

## 2013-04-28 DIAGNOSIS — Z95810 Presence of automatic (implantable) cardiac defibrillator: Secondary | ICD-10-CM

## 2013-04-28 DIAGNOSIS — I428 Other cardiomyopathies: Secondary | ICD-10-CM

## 2013-04-28 LAB — MDC_IDC_ENUM_SESS_TYPE_INCLINIC
Date Time Interrogation Session: 20150120050000
HighPow Impedance: 48 Ohm
Implantable Pulse Generator Serial Number: 111765
Lead Channel Impedance Value: 429 Ohm
Lead Channel Impedance Value: 578 Ohm
Lead Channel Impedance Value: 808 Ohm
Lead Channel Pacing Threshold Amplitude: 0.7 V
Lead Channel Pacing Threshold Amplitude: 0.9 V
Lead Channel Pacing Threshold Amplitude: 1.1 V
Lead Channel Pacing Threshold Pulse Width: 0.4 ms
Lead Channel Pacing Threshold Pulse Width: 0.4 ms
Lead Channel Pacing Threshold Pulse Width: 0.8 ms
Lead Channel Sensing Intrinsic Amplitude: 25 mV
Lead Channel Sensing Intrinsic Amplitude: 25 mV
Lead Channel Sensing Intrinsic Amplitude: 5.8 mV
Lead Channel Setting Pacing Amplitude: 2 V
Lead Channel Setting Pacing Amplitude: 2 V
Lead Channel Setting Pacing Amplitude: 2.4 V
Lead Channel Setting Pacing Pulse Width: 0.4 ms
Lead Channel Setting Pacing Pulse Width: 0.8 ms
Lead Channel Setting Sensing Sensitivity: 0.6 mV
Lead Channel Setting Sensing Sensitivity: 1 mV
Zone Setting Detection Interval: 273 ms
Zone Setting Detection Interval: 300 ms
Zone Setting Detection Interval: 375 ms

## 2013-04-28 NOTE — Progress Notes (Signed)
HPI [name] returns today for followup. She is a very pleasant 80 year old woman with chronic systolic heart failure, left bundle branch block, status post biventricular ICD insertion, 7 years ago who underwent device generator change out approximately 4 months ago. In the interim she has been stable. She notes occaisional episodes of non-exertional chest pain, not associated with sob. She has class II heart failure symptoms. No syncope. No recent ICD shock. Allergies  Allergen Reactions  . Shellfish Allergy Shortness Of Breath  . Aspirin Nausea And Vomiting  . Codeine Nausea And Vomiting     Current Outpatient Prescriptions  Medication Sig Dispense Refill  . acetaminophen (TYLENOL) 325 MG tablet Take 650 mg by mouth 2 (two) times daily.       Marland Kitchen buPROPion (WELLBUTRIN XL) 150 MG 24 hr tablet Take 150 mg by mouth daily.      . Calcium Carbonate (CALCIUM 500 PO) Take 500 mg by mouth daily.       . Cholecalciferol (VITAMIN D) 2000 UNITS CAPS Take 1 capsule by mouth daily.       . furosemide (LASIX) 40 MG tablet Take 20-40 mg by mouth 2 (two) times daily. 40 mg in the morning and 20 mg at night      . levothyroxine (SYNTHROID, LEVOTHROID) 25 MCG tablet Take 25 mcg by mouth daily before breakfast.       . metoprolol (LOPRESSOR) 50 MG tablet Take 0.5 tablets (25 mg total) by mouth 2 (two) times daily. takle 1/2 tablet twice daily  30 tablet  6  . olmesartan (BENICAR) 40 MG tablet Take 40 mg by mouth every morning.       Marland Kitchen omeprazole (PRILOSEC) 40 MG capsule Take 40 mg by mouth every morning.       . Oxcarbazepine (TRILEPTAL) 300 MG tablet       . potassium chloride (K-DUR,KLOR-CON) 10 MEQ tablet Take 10 mEq by mouth daily.      . sertraline (ZOLOFT) 100 MG tablet Take 100 mg by mouth daily.      Marland Kitchen spironolactone (ALDACTONE) 25 MG tablet Take 25 mg by mouth every morning.       . vitamin E 800 UNIT capsule Take 800 Units by mouth daily.        No current facility-administered medications for  this visit.     Past Medical History  Diagnosis Date  . Syncope   . Systolic CHF   . Depression   . ICD (implantable cardiac defibrillator), biventricular, in situ   . LBBB (left bundle branch block)   . Dyslipidemia   . CHF (congestive heart failure)     PACEMAKER & DEFIB  . Fainted 04/21/06    AT [place]  . Hypertension   . Arthritis   . Wears glasses   . Headache(784.0)   . GERD (gastroesophageal reflux disease)   . HLD (hyperlipidemia)   . Hypothyroidism   . Cancer     left breast cancer   . ICD (implantable cardiac defibrillator) in place     pt has pacer/icd  . Pacemaker     Guidant Device  . Complication of anesthesia     hypotensive after back surgery in 2006- reports on chart   . Nonischemic cardiomyopathy   . Normal coronary arteries     s/p cardiac cath 2007    ROS:   All systems reviewed and negative except as noted in the HPI.   Past Surgical History  Procedure Laterality Date  . Total knee  arthroplasty  05/17/01    RIGHT KNEE  . Lumbar fusion  2006  . Mastectomy, partial  2008    GOT PACEMAKER AND DEFIB AT THAT TIME  . Breast surgery  2000    LUMP REMOVAL. STAGE 1 CANCER  . Joint replacement  06/14/01    right  . Pacemaker insertion  04/23/06  . Back surgery      lumbar fusion   . Cardiac catheterization    . Mass excision  11/08/2011    Procedure: EXCISION MASS;  Surgeon: [name], MD;  Location: [facility];  Service: General;  Laterality: Left;  Excision Left Thigh Mass     Family History  Problem Relation Age of Onset  . Hypertension Mother   . Hypertension Father   . Hypertension Brother      History   Social History  . Marital Status: Married    Spouse Name: N/A    Number of Children: N/A  . Years of Education: N/A   Occupational History  . Not on file.   Social History Main Topics  . Smoking status: Never Smoker   . Smokeless tobacco: Never Used  . Alcohol Use: No  . Drug Use: No  . Sexual Activity: Not Currently     Birth Control/ Protection: Post-menopausal   Other Topics Concern  . Not on file   Social History Narrative  . No narrative on file     BP 120/68  Pulse 69  Ht 5' (1.524 m)  Wt 136 lb (61.689 kg)  BMI 26.56 kg/m2  Physical Exam:  Well appearing 80 year old woman, NAD HEENT: Unremarkable Neck:  7 cm JVD, no thyromegally Back:  No CVA tenderness Lungs:  Clear with no wheezes, rales, or rhonchi. Well healed ICD incision. HEART:  Regular rate rhythm, no murmurs, no rubs, no clicks Abd:  soft, positive bowel sounds, no organomegally, no rebound, no guarding Ext:  2 plus pulses, no edema, no cyanosis, no clubbing Skin:  No rashes no nodules Neuro:  CN II through XII intact, motor grossly intact   DEVICE  Normal device function.  See PaceArt for details.   Assess/Plan:

## 2013-04-28 NOTE — Assessment & Plan Note (Signed)
Her [employer] biventricular ICD is working normally. We'll plan to recheck in several months.

## 2013-04-28 NOTE — Patient Instructions (Signed)
Your physician wants you to follow-up in: 12 months with Dr Knox Saliva will receive a reminder letter in the mail two months in advance. If you don't receive a letter, please call our office to schedule the follow-up appointment.   Remote monitoring is used to monitor your Pacemaker or ICD from home. This monitoring reduces the number of office visits required to check your device to one time per year. It allows [place] to keep an eye on the functioning of your device to ensure it is working properly. You are scheduled for a device check from home on 07/30/13. You may send your transmission at any time that day. If you have a wireless device, the transmission will be sent automatically. After your physician reviews your transmission, you will receive a postcard with your next transmission date.

## 2013-04-28 NOTE — Assessment & Plan Note (Signed)
Her heart failure symptoms are class II. She will continue her current medical therapy, and maintain a low-sodium diet. I've encouraged the patient to increase her physical activity.

## 2013-04-28 NOTE — Assessment & Plan Note (Signed)
Her blood pressure is well controlled. No change in medical therapy.

## 2013-05-14 ENCOUNTER — Encounter: Payer: Self-pay | Admitting: Interventional Cardiology

## 2013-05-14 ENCOUNTER — Ambulatory Visit (INDEPENDENT_AMBULATORY_CARE_PROVIDER_SITE_OTHER): Payer: Medicare Other | Admitting: Interventional Cardiology

## 2013-05-14 VITALS — BP 110/64 | HR 62 | Ht 60.0 in | Wt 134.8 lb

## 2013-05-14 DIAGNOSIS — Z95810 Presence of automatic (implantable) cardiac defibrillator: Secondary | ICD-10-CM

## 2013-05-14 DIAGNOSIS — I5022 Chronic systolic (congestive) heart failure: Secondary | ICD-10-CM

## 2013-05-14 DIAGNOSIS — I10 Essential (primary) hypertension: Secondary | ICD-10-CM

## 2013-05-14 NOTE — Progress Notes (Signed)
Patient [name]: [name], female   DOB: 1933/11/27, 80 y.o.   MRN: [id number]    [address], [address] [place], [place]  [address] Phone: [phone] Fax:  [phone]  Date:  05/14/2013   [name]:  [name], DOB [birth year], MRN [id number]  PCP:  Salena Saner., MD   ASSESSMENT:  1. Nonischemic cardiomyopathy without recent evaluation of LV function 2. Implantable cardiac defibrillator 3. Hypertension  PLAN:  1. 2-D Doppler echocardiogram to reassess LV function 2. Continue medical regimen as listed 3. Clinical followup in one year   SUBJECTIVE: [name] is a 80 y.o. female who is doing well. She doesn't nonischemic cardiomyopathy. No AICD discharge. Recent adjustment and metoprolol dose by Dr. Lovena Le I think because of low blood pressure. She denies palpitations, syncope, and dyspnea. No peripheral edema noted.   Wt Readings from Last 3 Encounters:  05/14/13 134 lb 12.8 oz (61.145 kg)  04/28/13 136 lb (61.689 kg)  02/24/13 140 lb 9.6 oz (63.776 kg)     Past Medical History  Diagnosis Date  . Syncope   . Systolic CHF   . Depression   . ICD (implantable cardiac defibrillator), biventricular, in situ   . LBBB (left bundle branch block)   . Dyslipidemia   . CHF (congestive heart failure)     PACEMAKER & DEFIB  . Fainted 04/21/06    AT [place]  . Hypertension   . Arthritis   . Wears glasses   . Headache(784.0)   . GERD (gastroesophageal reflux disease)   . HLD (hyperlipidemia)   . Hypothyroidism   . Cancer     left breast cancer   . ICD (implantable cardiac defibrillator) in place     pt has pacer/icd  . Pacemaker     Guidant Device  . Complication of anesthesia     hypotensive after back surgery in 2006- reports on chart   . Nonischemic cardiomyopathy   . Normal coronary arteries     s/p cardiac cath 2007    Current Outpatient Prescriptions  Medication Sig Dispense Refill  . acetaminophen (TYLENOL) 325 MG tablet Take 650 mg by mouth 2  (two) times daily.       Marland Kitchen buPROPion (WELLBUTRIN XL) 150 MG 24 hr tablet Take 150 mg by mouth daily.      . Calcium Carbonate (CALCIUM 500 PO) Take 500 mg by mouth daily.       . Cholecalciferol (VITAMIN D) 2000 UNITS CAPS Take 1 capsule by mouth daily.       . furosemide (LASIX) 40 MG tablet Take 20-40 mg by mouth 2 (two) times daily. 40 mg in the morning and 20 mg at night      . levothyroxine (SYNTHROID, LEVOTHROID) 25 MCG tablet Take 25 mcg by mouth daily before breakfast.       . metoprolol (LOPRESSOR) 50 MG tablet Take 0.5 tablets (25 mg total) by mouth 2 (two) times daily. takle 1/2 tablet twice daily  30 tablet  6  . olmesartan (BENICAR) 40 MG tablet Take 40 mg by mouth every morning.       Marland Kitchen omeprazole (PRILOSEC) 40 MG capsule Take 40 mg by mouth every morning.       . Oxcarbazepine (TRILEPTAL) 300 MG tablet       . potassium chloride (K-DUR,KLOR-CON) 10 MEQ tablet Take 10 mEq by mouth daily.      . sertraline (ZOLOFT) 100 MG tablet Take 200 mg by mouth daily.       Marland Kitchen  spironolactone (ALDACTONE) 25 MG tablet Take 25 mg by mouth every morning.       . vitamin E 800 UNIT capsule Take 800 Units by mouth daily.        No current facility-administered medications for this visit.    Allergies:    Allergies  Allergen Reactions  . Shellfish Allergy Shortness Of Breath  . Aspirin Nausea And Vomiting  . Codeine Nausea And Vomiting    Social History:  The patient  reports that she has never smoked. She has never used smokeless tobacco. She reports that she does not drink alcohol or use illicit drugs.   ROS:  Please see the history of present illness.    All other systems reviewed and negative.   OBJECTIVE: VS:  BP 110/64  Pulse 62  Ht 5' (1.524 m)  Wt 134 lb 12.8 oz (61.145 kg)  BMI 26.33 kg/m2 Well nourished, well developed, in no acute distress, appears his stated age 63: normal Neck: JVD flat. Carotid bruit absent  Cardiac:  normal S1, S2; RRR; no murmur Lungs:  clear to  auscultation bilaterally, no wheezing, rhonchi or rales Abd: soft, nontender, no hepatomegaly Ext: Edema absent. Pulses 2+ and symmetric Skin: warm and dry Neuro:  CNs 2-12 intact, no focal abnormalities noted  EKG:  Normal sinus rhythm at 62 beats per minute ventricular pacing noted as the patient is tracking the atrium.     Past Medical History  Nonischemic cardiomyopathy, LVEF 35% by echo 2011   AICD, 2008 [employer] Sci   Anxiety/depression   Degenerative disc disease   Breast cancer     Signed, Illene Labrador III, MD 05/14/2013 2:33 PM

## 2013-05-14 NOTE — Patient Instructions (Signed)
Your physician recommends that you continue on your current medications as directed. Please refer to the Current Medication list given to you today.  Your physician has requested that you have an echocardiogram. Echocardiography is a painless test that uses sound waves to create images of your heart. It provides your doctor with information about the size and shape of your heart and how well your heart's chambers and valves are working. This procedure takes approximately one hour. There are no restrictions for this procedure.  Your physician wants you to follow-up in: 1 year You will receive a reminder letter in the mail two months in advance. If you don't receive a letter, please call our office to schedule the follow-up appointment.

## 2013-07-30 ENCOUNTER — Ambulatory Visit (INDEPENDENT_AMBULATORY_CARE_PROVIDER_SITE_OTHER): Payer: Medicare Other | Admitting: *Deleted

## 2013-07-30 ENCOUNTER — Encounter: Payer: Self-pay | Admitting: Internal Medicine

## 2013-07-30 DIAGNOSIS — I428 Other cardiomyopathies: Secondary | ICD-10-CM

## 2013-08-02 LAB — MDC_IDC_ENUM_SESS_TYPE_REMOTE
Battery Remaining Longevity: 96 mo
Brady Statistic RA Percent Paced: 0 %
Brady Statistic RV Percent Paced: 100 %
Implantable Pulse Generator Serial Number: 111765
Lead Channel Impedance Value: 423 Ohm
Lead Channel Impedance Value: 609 Ohm
Lead Channel Impedance Value: 769 Ohm
Lead Channel Sensing Intrinsic Amplitude: 22.1 mV
Lead Channel Sensing Intrinsic Amplitude: 25 mV
Lead Channel Sensing Intrinsic Amplitude: 6.9 mV
Lead Channel Setting Pacing Amplitude: 2 V
Lead Channel Setting Pacing Amplitude: 2 V
Lead Channel Setting Pacing Amplitude: 2.4 V
Lead Channel Setting Pacing Pulse Width: 0.4 ms
Lead Channel Setting Pacing Pulse Width: 0.8 ms
Lead Channel Setting Sensing Sensitivity: 0.6 mV
Lead Channel Setting Sensing Sensitivity: 1 mV
Zone Setting Detection Interval: 273 ms
Zone Setting Detection Interval: 300 ms
Zone Setting Detection Interval: 375 ms

## 2013-08-10 NOTE — Progress Notes (Signed)
ICD remote 

## 2013-08-11 ENCOUNTER — Encounter: Payer: Self-pay | Admitting: Cardiology

## 2013-08-26 ENCOUNTER — Ambulatory Visit (HOSPITAL_COMMUNITY): Payer: Medicare Other | Attending: Internal Medicine | Admitting: Radiology

## 2013-08-26 ENCOUNTER — Encounter (INDEPENDENT_AMBULATORY_CARE_PROVIDER_SITE_OTHER): Payer: Self-pay

## 2013-08-26 DIAGNOSIS — I5022 Chronic systolic (congestive) heart failure: Secondary | ICD-10-CM | POA: Insufficient documentation

## 2013-08-26 DIAGNOSIS — I509 Heart failure, unspecified: Secondary | ICD-10-CM | POA: Insufficient documentation

## 2013-08-26 DIAGNOSIS — I428 Other cardiomyopathies: Secondary | ICD-10-CM

## 2013-08-26 NOTE — Progress Notes (Signed)
Echocardiogram performed.  

## 2013-09-01 ENCOUNTER — Telehealth: Payer: Self-pay

## 2013-09-01 NOTE — Telephone Encounter (Signed)
Message copied by Lamar Laundry on Tue Sep 01, 2013  1:26 PM ------      Message from: Daneen Schick      Created: Tue Sep 01, 2013  8:56 AM       Let her know that LV function has nearly normalized from EF 20% in 2008 to 45-50% now. This is great news. No change in therapy required. ------

## 2013-09-01 NOTE — Telephone Encounter (Signed)
called to give echo results.lmom for pt to call back.

## 2013-09-02 NOTE — Telephone Encounter (Signed)
New message ° ° ° ° ° °Want echo results °

## 2013-09-02 NOTE — Telephone Encounter (Signed)
pt aware of echo results.Let her know that LV function has nearly normalized from EF 20% in 2008 to 45-50% now. This is great news. No change in therapy required.pt verbalized understanding.

## 2013-11-02 ENCOUNTER — Ambulatory Visit (INDEPENDENT_AMBULATORY_CARE_PROVIDER_SITE_OTHER): Payer: Medicare Other | Admitting: *Deleted

## 2013-11-02 DIAGNOSIS — I428 Other cardiomyopathies: Secondary | ICD-10-CM

## 2013-11-02 LAB — MDC_IDC_ENUM_SESS_TYPE_REMOTE
Brady Statistic RV Percent Paced: 99 %
HighPow Impedance: 49 Ohm
Implantable Pulse Generator Serial Number: 111765
Lead Channel Impedance Value: 415 Ohm
Lead Channel Impedance Value: 552 Ohm
Lead Channel Impedance Value: 789 Ohm
Lead Channel Sensing Intrinsic Amplitude: 4.9 mV
Lead Channel Setting Pacing Amplitude: 2 V
Lead Channel Setting Pacing Amplitude: 2 V
Lead Channel Setting Pacing Amplitude: 2.4 V
Lead Channel Setting Pacing Pulse Width: 0.4 ms
Lead Channel Setting Pacing Pulse Width: 0.8 ms
Lead Channel Setting Sensing Sensitivity: 0.6 mV
Lead Channel Setting Sensing Sensitivity: 1 mV
Zone Setting Detection Interval: 273 ms
Zone Setting Detection Interval: 300 ms
Zone Setting Detection Interval: 375 ms

## 2013-11-02 NOTE — Progress Notes (Signed)
Remote ICD transmission.   

## 2013-11-13 ENCOUNTER — Encounter: Payer: Self-pay | Admitting: Cardiology

## 2013-11-19 ENCOUNTER — Encounter: Payer: Self-pay | Admitting: Internal Medicine

## 2014-01-27 ENCOUNTER — Telehealth: Payer: Self-pay | Admitting: Internal Medicine

## 2014-01-27 NOTE — Telephone Encounter (Signed)
New Message  Pt called requests a call back to discuss how to send the remote signal//sr

## 2014-01-27 NOTE — Telephone Encounter (Signed)
Instructed pt how to send manual transmission.

## 2014-02-01 ENCOUNTER — Ambulatory Visit (INDEPENDENT_AMBULATORY_CARE_PROVIDER_SITE_OTHER): Payer: Medicare Other | Admitting: *Deleted

## 2014-02-01 ENCOUNTER — Encounter: Payer: Self-pay | Admitting: Internal Medicine

## 2014-02-01 DIAGNOSIS — I428 Other cardiomyopathies: Secondary | ICD-10-CM

## 2014-02-01 DIAGNOSIS — I429 Cardiomyopathy, unspecified: Secondary | ICD-10-CM

## 2014-02-01 LAB — MDC_IDC_ENUM_SESS_TYPE_REMOTE
Battery Remaining Longevity: 8
Brady Statistic RA Percent Paced: 0 %
Brady Statistic RV Percent Paced: 99 %
HighPow Impedance: 48 Ohm
Implantable Pulse Generator Serial Number: 111765
Lead Channel Impedance Value: 411 Ohm
Lead Channel Impedance Value: 513 Ohm
Lead Channel Impedance Value: 767 Ohm
Lead Channel Sensing Intrinsic Amplitude: 15.2 mV
Lead Channel Sensing Intrinsic Amplitude: 25 mV
Lead Channel Sensing Intrinsic Amplitude: 411 mV
Lead Channel Setting Pacing Amplitude: 2 V
Lead Channel Setting Pacing Amplitude: 2 V
Lead Channel Setting Pacing Amplitude: 2.4 V
Lead Channel Setting Pacing Pulse Width: 0.4 ms
Lead Channel Setting Pacing Pulse Width: 0.8 ms
Lead Channel Setting Sensing Sensitivity: 0.6 mV
Lead Channel Setting Sensing Sensitivity: 1 mV
Zone Setting Detection Interval: 273 ms
Zone Setting Detection Interval: 300 ms
Zone Setting Detection Interval: 375 ms

## 2014-02-01 NOTE — Progress Notes (Signed)
Remote ICD transmission.   

## 2014-02-12 ENCOUNTER — Telehealth: Payer: Self-pay | Admitting: Internal Medicine

## 2014-02-12 NOTE — Telephone Encounter (Signed)
Manual remote received. All diagnostics normal. No alerts or episodes. Pt does not have fever or warmth at device pocket. Pt understands to change positions while sitting or laying if discomfort occurs. Pt states device is not protruding through skin. Pt will call if further concern arises. Follow up as planned.

## 2014-02-12 NOTE — Telephone Encounter (Signed)
New Message  Pt called states that she can feel the wires in her chest and it is uncomfortable.. Please call

## 2014-02-12 NOTE — Telephone Encounter (Addendum)
Feels like it is migrating up her chest.  She feels like it has been a gradual problem and now if she lays on her left side it hurts.  Her most recent remote on 02/01/14 looks good and I have asked her to send another now just to ensure all is good.  I have asked that she not move the device around and avoid sleeping on her left side if it is painful.  I let her know I would let her know if there was a problem

## 2014-02-25 ENCOUNTER — Encounter: Payer: Self-pay | Admitting: Cardiology

## 2014-03-18 ENCOUNTER — Encounter (HOSPITAL_COMMUNITY): Payer: Self-pay | Admitting: Internal Medicine

## 2014-05-06 ENCOUNTER — Encounter: Payer: Self-pay | Admitting: Internal Medicine

## 2014-05-06 ENCOUNTER — Ambulatory Visit (INDEPENDENT_AMBULATORY_CARE_PROVIDER_SITE_OTHER): Payer: Medicare Other | Admitting: Internal Medicine

## 2014-05-06 VITALS — BP 92/60 | HR 69 | Ht 59.0 in | Wt 124.8 lb

## 2014-05-06 DIAGNOSIS — F329 Major depressive disorder, single episode, unspecified: Secondary | ICD-10-CM

## 2014-05-06 DIAGNOSIS — I5022 Chronic systolic (congestive) heart failure: Secondary | ICD-10-CM

## 2014-05-06 DIAGNOSIS — F32A Depression, unspecified: Secondary | ICD-10-CM

## 2014-05-06 DIAGNOSIS — Z95810 Presence of automatic (implantable) cardiac defibrillator: Secondary | ICD-10-CM

## 2014-05-06 LAB — MDC_IDC_ENUM_SESS_TYPE_INCLINIC
Date Time Interrogation Session: 20160128050000
HighPow Impedance: 49 Ohm
Implantable Pulse Generator Serial Number: 111765
Lead Channel Impedance Value: 424 Ohm
Lead Channel Impedance Value: 556 Ohm
Lead Channel Impedance Value: 743 Ohm
Lead Channel Pacing Threshold Amplitude: 0.7 V
Lead Channel Pacing Threshold Amplitude: 0.7 V
Lead Channel Pacing Threshold Amplitude: 1 V
Lead Channel Pacing Threshold Pulse Width: 0.4 ms
Lead Channel Pacing Threshold Pulse Width: 0.4 ms
Lead Channel Pacing Threshold Pulse Width: 0.8 ms
Lead Channel Sensing Intrinsic Amplitude: 23 mV
Lead Channel Sensing Intrinsic Amplitude: 25 mV
Lead Channel Sensing Intrinsic Amplitude: 6.8 mV
Lead Channel Setting Pacing Amplitude: 2 V
Lead Channel Setting Pacing Amplitude: 2 V
Lead Channel Setting Pacing Amplitude: 2.4 V
Lead Channel Setting Pacing Pulse Width: 0.4 ms
Lead Channel Setting Pacing Pulse Width: 0.8 ms
Lead Channel Setting Sensing Sensitivity: 0.6 mV
Lead Channel Setting Sensing Sensitivity: 1 mV
Zone Setting Detection Interval: 273 ms
Zone Setting Detection Interval: 300 ms
Zone Setting Detection Interval: 375 ms

## 2014-05-06 NOTE — Assessment & Plan Note (Signed)
She is encouraged to exercise daily and followup with her primary MD.

## 2014-05-06 NOTE — Progress Notes (Signed)
HPI [name] returns today for followup. She is a very pleasant 81 year old woman with chronic systolic heart failure, left bundle branch block, status post biventricular ICD insertion, 7 years ago who underwent device generator change out approximately 16 months ago. In the interim she has been stable. She notes occaisional episodes of non-exertional chest pain, not associated with sob. She has class IIA heart failure symptoms. No syncope. No recent ICD shock. She c/o depression and arthritic pain in her knee.  Allergies  Allergen Reactions  . Shellfish Allergy Shortness Of Breath  . Iodine     Iodine contrast  . Aspirin Nausea And Vomiting  . Codeine Nausea And Vomiting     Current Outpatient Prescriptions  Medication Sig Dispense Refill  . acetaminophen (TYLENOL) 325 MG tablet Take 650 mg by mouth 2 (two) times daily.     Marland Kitchen buPROPion (WELLBUTRIN XL) 150 MG 24 hr tablet Take 150 mg by mouth daily.    . Calcium Carbonate (CALCIUM 500 PO) Take 500 mg by mouth daily.     . Cholecalciferol (VITAMIN D) 2000 UNITS CAPS Take 1 capsule by mouth daily.     . furosemide (LASIX) 40 MG tablet Take 20-40 mg by mouth 2 (two) times daily. 40 mg in the morning and 20 mg at night    . levothyroxine (SYNTHROID, LEVOTHROID) 25 MCG tablet Take 25 mcg by mouth daily before breakfast.     . metoprolol (LOPRESSOR) 50 MG tablet Take 0.5 tablets (25 mg total) by mouth 2 (two) times daily. takle 1/2 tablet twice daily (Patient taking differently: Take 25 mg by mouth 2 (two) times daily. Take a 50 mg tablet by mouth in the am & take 1/2 (25 mg) tablet by mouth in the pm.) 30 tablet 6  . olmesartan (BENICAR) 40 MG tablet Take 40 mg by mouth every morning.     Marland Kitchen omeprazole (PRILOSEC) 40 MG capsule Take 40 mg by mouth every morning.     . Oxcarbazepine (TRILEPTAL) 300 MG tablet     . potassium chloride (K-DUR,KLOR-CON) 10 MEQ tablet Take 10 mEq by mouth daily.    . sertraline (ZOLOFT) 100 MG tablet Take 200 mg by  mouth daily.     Marland Kitchen spironolactone (ALDACTONE) 25 MG tablet Take 25 mg by mouth every morning.     . vitamin E 800 UNIT capsule Take 800 Units by mouth daily.      No current facility-administered medications for this visit.     Past Medical History  Diagnosis Date  . Syncope   . Systolic CHF   . Depression   . ICD (implantable cardiac defibrillator), biventricular, in situ   . LBBB (left bundle branch block)   . Dyslipidemia   . CHF (congestive heart failure)     PACEMAKER & DEFIB  . Fainted 04/21/06    AT [place]  . Hypertension   . Arthritis   . Wears glasses   . Headache(784.0)   . GERD (gastroesophageal reflux disease)   . HLD (hyperlipidemia)   . Hypothyroidism   . Cancer     left breast cancer   . ICD (implantable cardiac defibrillator) in place     pt has pacer/icd  . Pacemaker     Guidant Device  . Complication of anesthesia     hypotensive after back surgery in 2006- reports on chart   . Nonischemic cardiomyopathy   . Normal coronary arteries     s/p cardiac cath 2007    ROS:  All systems reviewed and negative except as noted in the HPI.   Past Surgical History  Procedure Laterality Date  . Total knee arthroplasty  05/17/01    RIGHT KNEE  . Lumbar fusion  2006  . Mastectomy, partial  2008    GOT PACEMAKER AND DEFIB AT THAT TIME  . Breast surgery  2000    LUMP REMOVAL. STAGE 1 CANCER  . Joint replacement  06/14/01    right  . Pacemaker insertion  04/23/06  . Back surgery      lumbar fusion   . Cardiac catheterization    . Mass excision  11/08/2011    Procedure: EXCISION MASS;  Surgeon: [name], MD;  Location: [facility];  Service: General;  Laterality: Left;  Excision Left Thigh Mass  . Implantable cardioverter defibrillator generator change N/A 12/18/2012    Procedure: IMPLANTABLE CARDIOVERTER DEFIBRILLATOR GENERATOR CHANGE;  Surgeon: [name], MD;  Location: [facility] CATH LAB;  Service: Cardiovascular;  Laterality: N/A;     Family History  Problem  Relation Age of Onset  . Hypertension Mother   . Hypertension Father   . Hypertension Brother      History   Social History  . Marital Status: Married    Spouse Name: N/A    Number of Children: N/A  . Years of Education: N/A   Occupational History  . Not on file.   Social History Main Topics  . Smoking status: Never Smoker   . Smokeless tobacco: Never Used  . Alcohol Use: No  . Drug Use: No  . Sexual Activity: Not Currently    Birth Control/ Protection: Post-menopausal   Other Topics Concern  . Not on file   Social History Narrative     BP 92/60 mmHg  Pulse 69  Ht 4\' 11"  (1.499 m)  Wt 124 lb 12.8 oz (56.609 kg)  BMI 25.19 kg/m2  Physical Exam:  Well appearing 81 year old woman, NAD HEENT: Unremarkable Neck:  7 cm JVD, no thyromegally Back:  No CVA tenderness Lungs:  Clear with no wheezes, rales, or rhonchi. Well healed ICD incision. HEART:  Regular rate rhythm, no murmurs, no rubs, no clicks Abd:  soft, positive bowel sounds, no organomegally, no rebound, no guarding Ext:  2 plus pulses, no edema, no cyanosis, no clubbing Skin:  No rashes no nodules Neuro:  CN II through XII intact, motor grossly intact   DEVICE  Normal device function.  See PaceArt for details.   Assess/Plan:

## 2014-05-06 NOTE — Assessment & Plan Note (Signed)
Her [employer] Sci BiV ICD is working normally. Will recheck in several months. 

## 2014-05-06 NOTE — Assessment & Plan Note (Signed)
Her symptoms are class 2A. She is encouraged to continue her current medications and maintain a low sodium diet. I have asked her to walk.

## 2014-05-06 NOTE — Patient Instructions (Signed)
Remote monitoring is used to monitor your Pacemaker of ICD from home. This monitoring reduces the number of office visits required to check your device to one time per year. It allows [place] to keep an eye on the functioning of your device to ensure it is working properly. You are scheduled for a device check from home on 08/05/14. You may send your transmission at any time that day. If you have a wireless device, the transmission will be sent automatically. After your physician reviews your transmission, you will receive a postcard with your next transmission date.  Your physician wants you to follow-up in: 1 year with Dr. Lovena Le. You will receive a reminder letter in the mail two months in advance. If you don't receive a letter, please call our office to schedule the follow-up appointment.  Your physician recommends that you continue on your current medications as directed. Please refer to the Current Medication list given to you today.

## 2014-05-14 ENCOUNTER — Encounter: Payer: Self-pay | Admitting: Internal Medicine

## 2014-05-25 ENCOUNTER — Telehealth: Payer: Self-pay | Admitting: Interventional Cardiology

## 2014-05-25 NOTE — Telephone Encounter (Signed)
New Message     Patient needs to be seen this week by Dr. Tamala Julian or by a NP/PA.  Patient is having issues with dizziness and lightheadness. Needs to be seen please call to schedule

## 2014-05-26 NOTE — Telephone Encounter (Signed)
appt made with Audry Riles on 2/19 @ [facility] office

## 2014-05-28 ENCOUNTER — Encounter: Payer: Self-pay | Admitting: Physician Assistant

## 2014-05-28 ENCOUNTER — Ambulatory Visit (INDEPENDENT_AMBULATORY_CARE_PROVIDER_SITE_OTHER): Payer: Medicare Other | Admitting: Physician Assistant

## 2014-05-28 ENCOUNTER — Telehealth: Payer: Self-pay | Admitting: Physician Assistant

## 2014-05-28 ENCOUNTER — Encounter: Payer: Self-pay | Admitting: Cardiology

## 2014-05-28 VITALS — BP 102/64 | HR 62 | Ht 59.0 in | Wt 125.6 lb

## 2014-05-28 DIAGNOSIS — R413 Other amnesia: Secondary | ICD-10-CM

## 2014-05-28 DIAGNOSIS — R251 Tremor, unspecified: Secondary | ICD-10-CM

## 2014-05-28 DIAGNOSIS — I5022 Chronic systolic (congestive) heart failure: Secondary | ICD-10-CM

## 2014-05-28 DIAGNOSIS — R42 Dizziness and giddiness: Secondary | ICD-10-CM | POA: Insufficient documentation

## 2014-05-28 DIAGNOSIS — I10 Essential (primary) hypertension: Secondary | ICD-10-CM

## 2014-05-28 NOTE — Assessment & Plan Note (Signed)
Patient reports progressive memory loss over the last year.  Will refer her to neurology for this and for the clonic movements that she's been experiencing.

## 2014-05-28 NOTE — Patient Instructions (Signed)
Please do a pacemaker download from home.   You have been referred to [facility] for memory loss and uncontrollable shaking.  Your physician recommends that you schedule a follow-up appointment in: 6 months with Dr. Daneen Schick.

## 2014-05-28 NOTE — Assessment & Plan Note (Signed)
Her weight is stable. She appears euvolemic.

## 2014-05-28 NOTE — Assessment & Plan Note (Signed)
Patient reports dizziness comes on at any time. It is not necessarily with position change. I've asked her to a remote download of her ICD when she gets home so we can review the data.

## 2014-05-28 NOTE — Telephone Encounter (Signed)
Faxed transmission to [facility] office

## 2014-05-28 NOTE — Telephone Encounter (Signed)
Pt called because she tried to do a remote upload and the device said "Call the doctor". I stated I could not see an upload reading, but that I would route to device clinic as they are more familiar w/ what to do.

## 2014-05-28 NOTE — Telephone Encounter (Signed)
Informed pt that transmission was received and there was no alerts. Pt verbalized understanding.

## 2014-05-28 NOTE — Telephone Encounter (Signed)
Pt called in stating that she was in to see Gaspar Bidding today and he instructed her to send in a reading from her pace maker and she wanted to know if everything went through properly. Please f/u with pt  Thanks

## 2014-05-28 NOTE — Assessment & Plan Note (Signed)
Blood pressure controlled, although she is a little bit hypotensive this morning. No changes in therapy.

## 2014-05-28 NOTE — Assessment & Plan Note (Signed)
Clonic movements.  Patient's had just a few episodes of this shakiness however, while she was in the exam room she actually had an episode lasted just secondary to while she was laying down. She seemed to have uncontrollable shaking of her upper extremities and torso. As soon as she sat up it resolved. She did not lose consciousness.

## 2014-05-28 NOTE — Progress Notes (Signed)
Patient ID: [name], female   DOB: 04-25-1933, 81 y.o.   MRN: [id number]    Date:  05/28/2014   ID:  [name], DOB Aug 30, 1933, MRN [id number]  PCP:  No primary care provider on file.  Primary Cardiologist: Smith/Taylor   Chief Complaint  Patient presents with  . Follow-up    No complaints of chest pain, SOB or dizziness.  Occas. ankle edema.     History of Present Illness: [name] is a 81 y.o. female with chronic systolic heart failure, left bundle branch block, status post biventricular ICD insertion, 7 years ago who underwent device generator change out Sept 2014.  She presents for one year evaluation.  She reports feeling dizzy Tuesday night with improvement by Wednesday morning.  She also reports upper body "shaking".  The only pain she has is from arthritis.  She gets some mild edema in her right ankle.  She reports her blood pressure at home usually runs 110-120/60-70.  The patient currently denies nausea, vomiting, fever, chest pain, shortness of breath, orthopnea, PND, cough, congestion, abdominal pain, hematochezia, melena,  claudication.  Wt Readings from Last 3 Encounters:  05/28/14 125 lb 9.6 oz (56.972 kg)  05/06/14 124 lb 12.8 oz (56.609 kg)  05/14/13 134 lb 12.8 oz (61.145 kg)     Past Medical History  Diagnosis Date  . Syncope   . Systolic CHF   . Depression   . ICD (implantable cardiac defibrillator), biventricular, in situ   . LBBB (left bundle branch block)   . Dyslipidemia   . CHF (congestive heart failure)     PACEMAKER & DEFIB  . Fainted 04/21/06    AT [place]  . Hypertension   . Arthritis   . Wears glasses   . Headache(784.0)   . GERD (gastroesophageal reflux disease)   . HLD (hyperlipidemia)   . Hypothyroidism   . Cancer     left breast cancer   . ICD (implantable cardiac defibrillator) in place     pt has pacer/icd  . Pacemaker     Guidant Device  . Complication of anesthesia     hypotensive after back surgery in 2006-  reports on chart   . Nonischemic cardiomyopathy   . Normal coronary arteries     s/p cardiac cath 2007   Past Surgical History  Procedure Laterality Date  . Total knee arthroplasty  05/17/01    RIGHT KNEE  . Lumbar fusion  2006  . Mastectomy, partial  2008    GOT PACEMAKER AND DEFIB AT THAT TIME  . Breast surgery  2000    LUMP REMOVAL. STAGE 1 CANCER  . Joint replacement  06/14/01    right  . Pacemaker insertion  04/23/06  . Back surgery      lumbar fusion   . Cardiac catheterization    . Mass excision  11/08/2011    Procedure: EXCISION MASS;  Surgeon: [name], MD;  Location: [facility];  Service: General;  Laterality: Left;  Excision Left Thigh Mass  . Implantable cardioverter defibrillator generator change N/A 12/18/2012    Procedure: IMPLANTABLE CARDIOVERTER DEFIBRILLATOR GENERATOR CHANGE;  Surgeon: [name], MD;  Location: [facility] CATH LAB;  Service: Cardiovascular;  Laterality: N/A;     Current Outpatient Prescriptions  Medication Sig Dispense Refill  . acetaminophen (TYLENOL) 325 MG tablet Take 650 mg by mouth 2 (two) times daily.     Marland Kitchen buPROPion (WELLBUTRIN XL) 150 MG 24 hr tablet Take 150 mg by mouth  daily.    . Calcium Carbonate (CALCIUM 500 PO) Take 500 mg by mouth daily.     . Cholecalciferol (VITAMIN D) 2000 UNITS CAPS Take 1 capsule by mouth daily.     . furosemide (LASIX) 40 MG tablet Take 20-40 mg by mouth 2 (two) times daily. 40 mg in the morning and 20 mg at night    . levothyroxine (SYNTHROID, LEVOTHROID) 25 MCG tablet Take 25 mcg by mouth daily before breakfast.     . metoprolol (LOPRESSOR) 50 MG tablet Take 50 mg by mouth 2 (two) times daily. One tablet in the AM 1/2 tablet in the PM    . olmesartan (BENICAR) 40 MG tablet Take 40 mg by mouth every morning.     . Omega-3 Fatty Acids (FISH OIL) 1000 MG CAPS Take 1 capsule by mouth daily.    Marland Kitchen omeprazole (PRILOSEC) 40 MG capsule Take 40 mg by mouth every morning.     . Oxcarbazepine (TRILEPTAL) 300 MG tablet     .  potassium chloride (K-DUR,KLOR-CON) 10 MEQ tablet Take 10 mEq by mouth daily.    . sertraline (ZOLOFT) 100 MG tablet Take 200 mg by mouth daily.     Marland Kitchen spironolactone (ALDACTONE) 25 MG tablet Take 25 mg by mouth every morning.     . vitamin E 800 UNIT capsule Take 800 Units by mouth daily.      No current facility-administered medications for this visit.    Allergies:    Allergies  Allergen Reactions  . Shellfish Allergy Shortness Of Breath  . Iodine     Iodine contrast  . Aspirin Nausea And Vomiting  . Codeine Nausea And Vomiting    Social History:  The patient  reports that she has never smoked. She has never used smokeless tobacco. She reports that she does not drink alcohol or use illicit drugs.   Family history:   Family History  Problem Relation Age of Onset  . Hypertension Mother   . Hypertension Father   . Hypertension Brother     ROS:  Please see the history of present illness.  All other systems reviewed and negative.   PHYSICAL EXAM: VS:  BP 102/64 mmHg  Pulse 62  Ht 4\' 11"  (1.499 m)  Wt 125 lb 9.6 oz (56.972 kg)  BMI 25.35 kg/m2 Well nourished, well developed, in no acute distress HEENT: Pupils are equal round react to light accommodation extraocular movements are intact.  Neck: no JVDNo cervical lymphadenopathy. Cardiac: Regular rate and rhythm without murmurs rubs or gallops. Lungs:  clear to auscultation bilaterally, no wheezing, rhonchi or rales Abd: soft, nontender, positive bowel sounds all quadrants, no hepatosplenomegaly Ext: no lower extremity edema.  2+ radial and dorsalis pedis pulses. Skin: warm and dry Neuro:  Grossly normal  ASSESSMENT AND PLAN:  Problem List Items Addressed This Visit    Shaky    Clonic movements.  Patient's had just a few episodes of this shakiness however, while she was in the exam room she actually had an episode lasted just secondary to while she was laying down. She seemed to have uncontrollable shaking of her upper  extremities and torso. As soon as she sat up it resolved. She did not lose consciousness.      Memory loss - Primary    Patient reports progressive memory loss over the last year.  Will refer her to neurology for this and for the clonic movements that she's been experiencing.      Relevant Orders  Ambulatory referral to Neurology   Dizziness    Patient reports dizziness comes on at any time. It is not necessarily with position change. I've asked her to a remote download of her ICD when she gets home so we can review the data.      Chronic systolic heart failure    Her weight is stable. She appears euvolemic.      Relevant Medications   metoprolol (LOPRESSOR) 50 MG tablet   Benign hypertension    Blood pressure controlled, although she is a little bit hypotensive this morning. No changes in therapy.      Relevant Medications   metoprolol (LOPRESSOR) 50 MG tablet    Other Visit Diagnoses    Shakiness        Relevant Orders    Ambulatory referral to Neurology

## 2014-06-01 ENCOUNTER — Encounter: Payer: Self-pay | Admitting: Physician Assistant

## 2014-06-09 ENCOUNTER — Encounter: Payer: Self-pay | Admitting: Neurology

## 2014-06-09 ENCOUNTER — Ambulatory Visit (INDEPENDENT_AMBULATORY_CARE_PROVIDER_SITE_OTHER): Payer: Medicare Other | Admitting: Neurology

## 2014-06-09 VITALS — BP 134/68 | HR 65 | Ht 59.0 in | Wt 125.0 lb

## 2014-06-09 DIAGNOSIS — R413 Other amnesia: Secondary | ICD-10-CM

## 2014-06-09 NOTE — Progress Notes (Signed)
PATIENT: [name] DOB: 1933/05/25  Stacey  APRIL Hurley is a 81 yo RH AAF referred by her primary care Dr. Baird Cancer for evaluation of memory trouble, drive herself to office alone at today's clinical visit.  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 Nicholes Calamity under general anesthesia, woke up from surgery, she noticed mild memory trouble, she has short-term memory trouble, has been persistent since then, she denies difficulty talking, no strokelike symptoms then.  She lives at home with her family, highly functional, driving, independent at daily activity, able to keep her check in balance,  She suffered long-standing history of bipolar disorder, on polypharmacy treatment, this including Trileptal 300 mg daily, Zoloft 100 mg a day, Wellbutrin 150 mg 3 tablets a day,  She had accident of sudden onset dizziness couple days ago, after dinner, she felt lightheaded, has to crawling upstairs, was helped by her husband to get up, then she fell to the ground, whole-body shaking, no loss of consciousness.  She has baseline mild gait difficulty due to her low back pain, bilateral knee pain,  She denied a family history of dementia,  CT in 2013, Unchanged mild atrophy and microvascular ischemic disease without acute intracranial process.  She had a history of chronic systolic heart failure, left bundle branch block, status post biventricular ICD insertion in 2009, underwent device generator change out Sept 2014. S  he presents for one year evaluation. She reports feeling dizzy Tuesday night with improvement by Wednesday morning. She also reports upper body "shaking". The only pain she has is from arthritis. She gets some mild edema in her right ankle. She reports her blood pressure at home usually runs 110-120/60-70. Sometimes  when she gets up quickly, she felt lightheaded,   REVIEW OF SYSTEMS: Full 14 system review of systems performed and notable only for joints pain, joint swelling, allergy, memory loss, dizziness, tremor, depression, decreased energy  ALLERGIES: Allergies  Allergen Reactions  . Shellfish Allergy Shortness Of Breath  . Iodine     Iodine contrast  . Aspirin Nausea And Vomiting  . Codeine Nausea And Vomiting    HOME MEDICATIONS: Current Outpatient Prescriptions  Medication Sig Dispense Refill  . acetaminophen (TYLENOL) 325 MG tablet Take 650 mg by mouth 2 (two) times daily.     Marland Kitchen buPROPion (WELLBUTRIN XL) 150 MG 24 hr tablet Take 150 mg by mouth daily.    . Calcium Carbonate (CALCIUM 500 PO) Take 500 mg by mouth daily.     . Cholecalciferol (VITAMIN D) 2000 UNITS CAPS Take 1 capsule by mouth daily.     . furosemide (LASIX) 40 MG tablet Take 20-40 mg by mouth 2 (two) times daily. 40 mg in the morning and 20 mg at night    . levothyroxine (SYNTHROID, LEVOTHROID) 25 MCG tablet Take 25 mcg by mouth daily before breakfast.     . metoprolol (LOPRESSOR) 50 MG tablet Take 50 mg by mouth 2 (two) times daily. One tablet in the AM 1/2 tablet in the PM    . olmesartan (BENICAR) 40 MG tablet Take 40 mg by mouth every morning.     . Omega-3 Fatty Acids (FISH OIL) 1000 MG CAPS Take 1 capsule by mouth daily.    Marland Kitchen omeprazole (PRILOSEC) 40 MG capsule Take 40 mg by mouth every morning.     Marland Kitchen  Oxcarbazepine (TRILEPTAL) 300 MG tablet     . potassium chloride (K-DUR,KLOR-CON) 10 MEQ tablet Take 10 mEq by mouth daily.    . sertraline (ZOLOFT) 100 MG tablet Take 200 mg by mouth daily.     Marland Kitchen spironolactone (ALDACTONE) 25 MG tablet Take 25 mg by mouth every morning.     . vitamin E 800 UNIT capsule Take 800 Units by mouth daily.      No current facility-administered medications for this visit.    PAST MEDICAL HISTORY: Past Medical History  Diagnosis Date  . Syncope   . Systolic CHF   . Depression   . ICD  (implantable cardiac defibrillator), biventricular, in situ   . LBBB (left bundle branch block)   . Dyslipidemia   . CHF (congestive heart failure)     PACEMAKER & DEFIB  . Fainted 04/21/06    AT [place]  . Hypertension   . Arthritis   . Wears glasses   . Headache(784.0)   . GERD (gastroesophageal reflux disease)   . HLD (hyperlipidemia)   . Hypothyroidism   . Cancer     left breast cancer   . ICD (implantable cardiac defibrillator) in place     pt has pacer/icd  . Pacemaker     Guidant Device  . Complication of anesthesia     hypotensive after back surgery in 2006- reports on chart   . Nonischemic cardiomyopathy   . Normal coronary arteries     s/p cardiac cath 2007    PAST SURGICAL HISTORY: Past Surgical History  Procedure Laterality Date  . Total knee arthroplasty  05/17/01    RIGHT KNEE  . Lumbar fusion  2006  . Mastectomy, partial  2008    GOT PACEMAKER AND DEFIB AT THAT TIME  . Breast surgery  2000    LUMP REMOVAL. STAGE 1 CANCER  . Joint replacement  06/14/01    right  . Pacemaker insertion  04/23/06  . Back surgery      lumbar fusion   . Cardiac catheterization    . Mass excision  11/08/2011    Procedure: EXCISION MASS;  Surgeon: [name], MD;  Location: [facility];  Service: General;  Laterality: Left;  Excision Left Thigh Mass  . Implantable cardioverter defibrillator generator change N/A 12/18/2012    Procedure: IMPLANTABLE CARDIOVERTER DEFIBRILLATOR GENERATOR CHANGE;  Surgeon: [name], MD;  Location: [facility] CATH LAB;  Service: Cardiovascular;  Laterality: N/A;  . Cataract extraction      FAMILY HISTORY: Family History  Problem Relation Age of Onset  . Hypertension Mother   . Hypertension Father   . Hypertension Brother     SOCIAL HISTORY:  History   Social History  . Marital Status: Married    Spouse Name: N/A  . Number of Children: 1  . Years of Education: Masters   Occupational History  . Retired    Social History Main Topics  . Smoking  status: Never Smoker   . Smokeless tobacco: Never Used  . Alcohol Use: No  . Drug Use: No  . Sexual Activity: Not Currently    Birth Control/ Protection: Post-menopausal   Other Topics Concern  . Not on file   Social History Narrative   Lives at home with husband.   Right-handed.   No caffeine use.     PHYSICAL EXAM   Filed Vitals:   06/09/14 1313  BP: 134/68  Pulse: 65  Height: 4\' 11"  (1.499 m)  Weight: 125 lb (56.7 kg)  Not recorded      Body mass index is 25.23 kg/(m^2).  PHYSICAL EXAMNIATION:  Gen: NAD, conversant, well nourised, obese, well groomed                     Cardiovascular: Regular rate rhythm, no peripheral edema, warm, nontender. Eyes: Conjunctivae clear without exudates or hemorrhage Neck: Supple, no carotid bruise. Pulmonary: Clear to auscultation bilaterally   NEUROLOGICAL EXAM:  MENTAL STATUS: Speech:    Speech is normal; fluent and spontaneous with normal comprehension.  Cognition: Mini-Mental Status Examination is 28 out of 30, she missed 2 out of 3 recalls    The patient is oriented to person, place, and time;    language fluent;     normal attention, concentration,     fund of knowledge.  CRANIAL NERVES: CN II: Visual fields are full to confrontation. Fundoscopic exam is normal with sharp discs and no vascular changes. Venous pulsations are present bilaterally. Pupils are 4 mm and briskly reactive to light. Visual acuity is 20/20 bilaterally. CN III, IV, VI: extraocular movement are normal. No ptosis. CN V: Facial sensation is intact to pinprick in all 3 divisions bilaterally. Corneal responses are intact.  CN VII: Face is symmetric with normal eye closure and smile. CN VIII: Hearing is normal to rubbing fingers CN IX, X: Palate elevates symmetrically. Phonation is normal. CN XI: Head turning and shoulder shrug are intact CN XII: Tongue is midline with normal movements and no atrophy.  MOTOR: There is no pronator drift of  out-stretched arms. Muscle bulk and tone are normal. Muscle strength is normal.   Shoulder abduction Shoulder external rotation Elbow flexion Elbow extension Wrist flexion Wrist extension Finger abduction Hip flexion Knee flexion Knee extension Ankle dorsi flexion Ankle plantar flexion  R 5 5 5 5 5 5 5 5 5 5 5 5   L 5 5 5 5 5 5 5 5 5 5 5 5     REFLEXES: Reflexes are 2+ and symmetric at the biceps, triceps, knees, and ankles. Plantar responses are flexor.  SENSORY: Light touch, pinprick, position sense, and vibration sense are intact in fingers and toes.  COORDINATION: Rapid alternating movements and fine finger movements are intact. There is no dysmetria on finger-to-nose and heel-knee-shin. There are no abnormal or extraneous movements.   GAIT/STANCE: Posture is cautious, antalgic gait   DIAGNOSTIC DATA (LABS, IMAGING, TESTING) - I reviewed patient records, labs, notes, testing and imaging myself where available.  Lab Results  Component Value Date   WBC 10.1 02/23/2013   HGB 13.2 02/23/2013   HCT 42.8 02/23/2013   MCV 91.2 02/23/2013   PLT 154 01/02/2013      Component Value Date/Time   NA 137 01/02/2013 1040   K 4.2 01/02/2013 1040   CL 101 01/02/2013 1040   CO2 24 01/02/2013 1040   GLUCOSE 96 01/02/2013 1040   BUN 25* 01/02/2013 1040   CREATININE 0.93 01/02/2013 1040   CALCIUM 10.0 01/02/2013 1040   PROT 7.1 01/02/2013 1040   ALBUMIN 3.5 01/02/2013 1040   AST 24 01/02/2013 1040   ALT 21 01/02/2013 1040   ALKPHOS 74 01/02/2013 1040   BILITOT 0.2* 01/02/2013 1040   GFRNONAA 58* 01/02/2013 1040   GFRAA 67* 01/02/2013 1040   Lab Results  Component Value Date   CHOL  01/02/2010    101        ATP III CLASSIFICATION:  <200     mg/dL   Desirable  200-239  mg/dL   Borderline High  >=240    mg/dL   High          HDL 32* 01/02/2010   LDLCALC  01/02/2010    39        Total Cholesterol/HDL:CHD Risk Coronary Heart Disease Risk Table                     Men   Women   1/2 Average Risk   3.4   3.3  Average Risk       5.0   4.4  2 X Average Risk   9.6   7.1  3 X Average Risk  23.4   11.0        Use the calculated Patient Ratio above and the CHD Risk Table to determine the patient's CHD Risk.        ATP III CLASSIFICATION (LDL):  <100     mg/dL   Optimal  100-129  mg/dL   Near or Above                    Optimal  130-159  mg/dL   Borderline  160-189  mg/dL   High  >190     mg/dL   Very High   TRIG 148 01/02/2010   CHOLHDL 3.2 01/02/2010   No results found for: HGBA1C No results found for: VITAMINB12 Lab Results  Component Value Date   TSH 3.488 01/02/2010      ASSESSMENT AND PLAN  FEIGA NADEL is a 81 y.o. female  with gradual onset memory trouble, Mini-Mental Status Examination 28 out of 30, she has a history of bipolar disorder, on polypharmacy treatment, status post ICD placement, complains of intermittent episode of dizziness, one severe episode fell to the ground, with body shaking,  1, complete evaluation with CAT scan of the brain without contrast, EEG, 2, document her blood pressure daily, 3, keep moderate exercise, 4, laboratory evaluation at her next yearly checkup   Marcial Pacas, M.D. Ph.D.  [facility] [address], [place], [place] [address] Ph: [phone] Fax: [phone]

## 2014-06-16 ENCOUNTER — Ambulatory Visit (INDEPENDENT_AMBULATORY_CARE_PROVIDER_SITE_OTHER): Payer: Medicare Other | Admitting: Neurology

## 2014-06-16 DIAGNOSIS — R413 Other amnesia: Secondary | ICD-10-CM

## 2014-06-17 ENCOUNTER — Telehealth: Payer: Self-pay | Admitting: Neurology

## 2014-06-17 ENCOUNTER — Ambulatory Visit
Admission: RE | Admit: 2014-06-17 | Discharge: 2014-06-17 | Disposition: A | Discharge status: Home / Self Care | Payer: Medicare Other | Source: Ambulatory Visit | Attending: Neurology | Admitting: Neurology

## 2014-06-17 DIAGNOSIS — R413 Other amnesia: Secondary | ICD-10-CM

## 2014-06-17 NOTE — Telephone Encounter (Signed)
Michelle: Please call patient, CAT scan showed age-related changes, no change compared to previous CAT scan in 2013, if she has questions, will go over detail in her next follow-up visit in April  IMPRESSION: Abnormal CT scan of the head showing mild age-related changes of chronic microvascular ischemia and generalized cerebral atrophy. Overall no significant change compared with CT head dated 07/29/2011

## 2014-06-17 NOTE — Telephone Encounter (Signed)
Spoke to patient - aware of results. 

## 2014-06-18 NOTE — Procedures (Addendum)
   HISTORY:  81 year old female, with gradual onset memory trouble, one episode of dizziness, fell to the ground shaking,  TECHNIQUE:  16 channel EEG was performed based on standard 10-16 international system. One channel was dedicated to EKG, which has demonstrates normal sinus rhythm of  66 beats per minutes.  Upon awakening, the posterior background activity was dysarrhythmic activity, with mixed alpha and theta range activity,  Photic stimulation was performed, which induced a symmetric photic driving.  Hyperventilation was not performed, there was no abnormality elicit.  Stage 2 sleep was achieved.  CONCLUSION: This is a   abnormal EEG.  There is electrodiagnostic evidence of  generalized slowing of posterior background activity, coming etiology or metabolic toxic, advanced dementia.

## 2014-07-07 ENCOUNTER — Ambulatory Visit (INDEPENDENT_AMBULATORY_CARE_PROVIDER_SITE_OTHER): Payer: Medicare Other | Admitting: Interventional Cardiology

## 2014-07-07 ENCOUNTER — Encounter: Payer: Self-pay | Admitting: Interventional Cardiology

## 2014-07-07 VITALS — BP 120/68 | HR 76 | Ht <= 58 in | Wt 126.0 lb

## 2014-07-07 DIAGNOSIS — I5022 Chronic systolic (congestive) heart failure: Secondary | ICD-10-CM

## 2014-07-07 DIAGNOSIS — Z95810 Presence of automatic (implantable) cardiac defibrillator: Secondary | ICD-10-CM | POA: Diagnosis not present

## 2014-07-07 DIAGNOSIS — I10 Essential (primary) hypertension: Secondary | ICD-10-CM | POA: Diagnosis not present

## 2014-07-07 NOTE — Progress Notes (Signed)
Cardiology Office Note   Date:  07/08/2014   ID:  [name], DOB Jan 05, 1934, MRN [id number]  PCP:  Maximino Greenland, MD  Cardiologist:   [name], MD   No chief complaint on file.     History of Present Illness: [name] is a 81 y.o. female who presents for evaluation and follow-up with chronic systolic heart failure due to nonischemic cardiomyopathy. Her physical activity is limited by chronic low back discomfort and spinal stenosis. She denies orthopnea and exertional dyspnea. She has had no discharge from her AICD. She furthermore denies palpitations and peripheral edema.    Past Medical History  Diagnosis Date  . Syncope   . Systolic CHF   . Depression   . ICD (implantable cardiac defibrillator), biventricular, in situ   . LBBB (left bundle branch block)   . Dyslipidemia   . CHF (congestive heart failure)     PACEMAKER & DEFIB  . Fainted 04/21/06    AT [place]  . Hypertension   . Arthritis   . Wears glasses   . Headache(784.0)   . GERD (gastroesophageal reflux disease)   . HLD (hyperlipidemia)   . Hypothyroidism   . Cancer     left breast cancer   . ICD (implantable cardiac defibrillator) in place     pt has pacer/icd  . Pacemaker     Guidant Device  . Complication of anesthesia     hypotensive after back surgery in 2006- reports on chart   . Nonischemic cardiomyopathy   . Normal coronary arteries     s/p cardiac cath 2007    Past Surgical History  Procedure Laterality Date  . Total knee arthroplasty  05/17/01    RIGHT KNEE  . Lumbar fusion  2006  . Mastectomy, partial  2008    GOT PACEMAKER AND DEFIB AT THAT TIME  . Breast surgery  2000    LUMP REMOVAL. STAGE 1 CANCER  . Joint replacement  06/14/01    right  . Pacemaker insertion  04/23/06  . Back surgery      lumbar fusion   . Cardiac catheterization    . Mass excision  11/08/2011    Procedure: EXCISION MASS;  Surgeon: [name], MD;  Location: [facility];  Service: General;   Laterality: Left;  Excision Left Thigh Mass  . Implantable cardioverter defibrillator generator change N/A 12/18/2012    Procedure: IMPLANTABLE CARDIOVERTER DEFIBRILLATOR GENERATOR CHANGE;  Surgeon: [name], MD;  Location[facility] CATH LAB;  Service: Cardiovascular;  Laterality: N/A;  . Cataract extraction       Current Outpatient Prescriptions  Medication Sig Dispense Refill  . acetaminophen (TYLENOL) 325 MG tablet Take 650 mg by mouth 2 (two) times daily.     Marland Kitchen buPROPion (WELLBUTRIN XL) 150 MG 24 hr tablet Take 450 mg by mouth daily.    . Calcium Carbonate (CALCIUM 500 PO) Take 500 mg by mouth daily.     . Cholecalciferol (VITAMIN D) 2000 UNITS CAPS Take 1 capsule by mouth daily.     . furosemide (LASIX) 40 MG tablet Take 20-40 mg by mouth 2 (two) times daily. 40 mg in the morning and 20 mg at night    . levothyroxine (SYNTHROID, LEVOTHROID) 25 MCG tablet Take 25 mcg by mouth daily before breakfast.     . metoprolol (LOPRESSOR) 50 MG tablet Take 50 mg by mouth 2 (two) times daily. One tablet in the AM 1/2 tablet in the PM    .  olmesartan (BENICAR) 40 MG tablet Take 40 mg by mouth every morning.     . Omega-3 Fatty Acids (FISH OIL) 1000 MG CAPS Take 1 capsule by mouth daily.    Marland Kitchen omeprazole (PRILOSEC) 40 MG capsule Take 40 mg by mouth every morning.     . Oxcarbazepine (TRILEPTAL) 300 MG tablet Take 300 mg by mouth daily.     . potassium chloride (K-DUR) 10 MEQ tablet Take 10 mEq by mouth once.     . potassium chloride (K-DUR,KLOR-CON) 10 MEQ tablet Take 10 mEq by mouth daily.    . sertraline (ZOLOFT) 100 MG tablet Take 200 mg by mouth daily.     Marland Kitchen spironolactone (ALDACTONE) 25 MG tablet Take 25 mg by mouth every morning.     . vitamin E 800 UNIT capsule Take 800 Units by mouth daily.      No current facility-administered medications for this visit.    Allergies:   Shellfish allergy; Iodine; Aspirin; and Codeine    Social History:  The patient  reports that she has never smoked. She  has never used smokeless tobacco. She reports that she does not drink alcohol or use illicit drugs.   Family History:  The patient's family history includes Hypertension in her brother, father, and mother.    ROS:  Please see the history of present illness.   Otherwise, review of systems are positive for back pain and leg weakness.   All other systems are reviewed and negative.    PHYSICAL EXAM: VS:  BP 120/68 mmHg  Pulse 76  Ht 4\' 10"  (1.473 m)  Wt 126 lb (57.153 kg)  BMI 26.34 kg/m2 , BMI Body mass index is 26.34 kg/(m^2). GEN: Well nourished, well developed, in no acute distress HEENT: normal Neck: no JVD, carotid bruits, or masses Cardiac: RRR; no murmurs, rubs, or gallops,no edema  Respiratory:  clear to auscultation bilaterally, normal work of breathing GI: soft, nontender, nondistended, + BS MS: no deformity or atrophy Skin: warm and dry, no rash Neuro:  Strength and sensation are intact Psych: euthymic mood, full affect   EKG:  EKG is not ordered today.    Recent Labs: No results found for requested labs within last 365 days.    Lipid Panel    Component Value Date/Time   CHOL  01/02/2010 0554    101        ATP III CLASSIFICATION:  <200     mg/dL   Desirable  200-239  mg/dL   Borderline High  >=240    mg/dL   High          TRIG 148 01/02/2010 0554   HDL 32* 01/02/2010 0554   CHOLHDL 3.2 01/02/2010 0554   VLDL 30 01/02/2010 0554   LDLCALC  01/02/2010 0554    39        Total Cholesterol/HDL:CHD Risk Coronary Heart Disease Risk Table                     Men   Women  1/2 Average Risk   3.4   3.3  Average Risk       5.0   4.4  2 X Average Risk   9.6   7.1  3 X Average Risk  23.4   11.0        Use the calculated Patient Ratio above and the CHD Risk Table to determine the patient's CHD Risk.        ATP III CLASSIFICATION (LDL):  <  100     mg/dL   Optimal  100-129  mg/dL   Near or Above                    Optimal  130-159  mg/dL   Borderline  160-189   mg/dL   High  >190     mg/dL   Very High      Wt Readings from Last 3 Encounters:  07/07/14 126 lb (57.153 kg)  06/09/14 125 lb (56.7 kg)  05/28/14 125 lb 9.6 oz (56.972 kg)      Other studies Reviewed: Additional studies/ records that were reviewed today include: .   ASSESSMENT AND PLAN:  Chronic systolic heart failure : Stable without evidence of volume overload. Activity is limited by severe back discomfort  ICD (implantable cardioverter-defibrillator), biventricular, in situ: Followed in device clinic  Benign hypertension: Controlled     Current medicines are reviewed at length with the patient today.  The patient does not have concerns regarding medicines.  The following changes have been made:  no change  Labs/ tests ordered today include:  No orders of the defined types were placed in this encounter.     Disposition:   FU with Linard Millers in 1 Year   Signed, [name], MD  07/08/2014 10:36 AM    [address], [place], [place]  [address] Phone: [phone]; Fax: [phone]

## 2014-07-07 NOTE — Patient Instructions (Signed)
Your physician wants you to follow-up in: 12 months with Dr Gaspar Bidding will receive a reminder letter in the mail two months in advance. If you don't receive a letter, please call our office to schedule the follow-up appointment.  Your physician recommends that you continue on your current medications as directed. Please refer to the Current Medication list given to you today.   Call our office if you have any shortness of breath or swelling  Thank you for choosing [facility]!!

## 2014-07-16 ENCOUNTER — Ambulatory Visit (INDEPENDENT_AMBULATORY_CARE_PROVIDER_SITE_OTHER): Payer: Medicare Other | Admitting: Neurology

## 2014-07-16 ENCOUNTER — Encounter: Payer: Self-pay | Admitting: Neurology

## 2014-07-16 VITALS — BP 124/65 | HR 62 | Ht <= 58 in | Wt 126.0 lb

## 2014-07-16 DIAGNOSIS — R413 Other amnesia: Secondary | ICD-10-CM

## 2014-07-16 NOTE — Progress Notes (Signed)
PATIENT: [name] DOB: 1934/01/30  HISTORICAL  [name] is a 81 yo RH AAF referred by her primary care Dr. Baird Cancer for evaluation of memory trouble, drive herself to office alone at today's clinical visit.  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 Nicholes Calamity under general anesthesia, woke up from surgery, she noticed mild memory trouble, she has short-term memory trouble, has been persistent since then, she denies difficulty talking, no strokelike symptoms then.  She lives at home with her family, highly functional, driving, independent at daily activity, able to keep her check in balance,  She suffered long-standing history of bipolar disorder, on polypharmacy treatment, this including Trileptal 300 mg daily, Zoloft 100 mg a day, Wellbutrin 150 mg 3 tablets a day,  She had accident of sudden onset dizziness couple days ago, after dinner, she felt lightheaded, has to crawling upstairs, was helped by her husband to get up, then she fell to the ground, whole-body shaking, no loss of consciousness.  She has baseline mild gait difficulty due to her low back pain, bilateral knee pain,  She denied a family history of dementia,  CT head in 2013, Unchanged mild atrophy and microvascular ischemic disease without acute intracranial process.  She had a history of chronic systolic heart failure, left bundle branch block, status post biventricular ICD insertion in 2009, underwent device generator change out Sept 2014. S  Se presents for one year evaluation. She reports feeling dizzy Tuesday night with improvement by Wednesday morning. She also reports upper body "shaking". The only pain she has is from arthritis. She gets some mild edema in her right ankle. She reports her blood pressure at home usually runs 110-120/60-70.  Sometimes when she gets up quickly, she felt lightheaded,  UPDATE April 8th 2016: She is overall doing very well, only has occasionally dizziness, especially when she gets up quickly, today's Mini-Mental status examination is 29 out of 30 CAT scan of the brain showed mild small vessel disease, no acute lesions, EEG showed mild slowing . REVIEW OF SYSTEMS: Full 14 system review of systems performed and notable only for joints pain, joint swelling, allergy, memory loss, dizziness, tremor, depression, decreased energy  ALLERGIES: Allergies  Allergen Reactions  . Shellfish Allergy Shortness Of Breath  . Iodine     Iodine contrast  . Aspirin Nausea And Vomiting  . Codeine Nausea And Vomiting    HOME MEDICATIONS: Current Outpatient Prescriptions  Medication Sig Dispense Refill  . acetaminophen (TYLENOL) 325 MG tablet Take 650 mg by mouth 2 (two) times daily.     Marland Kitchen buPROPion (WELLBUTRIN XL) 150 MG 24 hr tablet Take 450 mg by mouth daily.    . Calcium Carbonate (CALCIUM 500 PO) Take 500 mg by mouth daily.     . Cholecalciferol (VITAMIN D) 2000 UNITS CAPS Take 1 capsule by mouth daily.     . furosemide (LASIX) 40 MG tablet Take 20-40 mg by mouth 2 (two) times daily. 40 mg in the morning and 20 mg at night    . levothyroxine (SYNTHROID, LEVOTHROID) 25 MCG tablet Take 25 mcg by mouth daily before breakfast.     . metoprolol (LOPRESSOR) 50 MG tablet Take 50 mg by mouth 2 (two) times daily. One tablet in the AM 1/2 tablet in the PM    . olmesartan (BENICAR)  40 MG tablet Take 40 mg by mouth every morning.     . Omega-3 Fatty Acids (FISH OIL) 1000 MG CAPS Take 1 capsule by mouth daily.    Marland Kitchen omeprazole (PRILOSEC) 40 MG capsule Take 40 mg by mouth every morning.     . Oxcarbazepine (TRILEPTAL) 300 MG tablet Take 300 mg by mouth daily.     . potassium chloride (K-DUR) 10 MEQ tablet Take 10 mEq by mouth once.     . sertraline (ZOLOFT) 100 MG tablet Take 200 mg by mouth daily.     Marland Kitchen spironolactone  (ALDACTONE) 25 MG tablet Take 25 mg by mouth every morning.     . vitamin E 800 UNIT capsule Take 800 Units by mouth daily.      No current facility-administered medications for this visit.    PAST MEDICAL HISTORY: Past Medical History  Diagnosis Date  . Syncope   . Systolic CHF   . Depression   . ICD (implantable cardiac defibrillator), biventricular, in situ   . LBBB (left bundle branch block)   . Dyslipidemia   . CHF (congestive heart failure)     PACEMAKER & DEFIB  . Fainted 04/21/06    AT [place]  . Hypertension   . Arthritis   . Wears glasses   . Headache(784.0)   . GERD (gastroesophageal reflux disease)   . HLD (hyperlipidemia)   . Hypothyroidism   . Cancer     left breast cancer   . ICD (implantable cardiac defibrillator) in place     pt has pacer/icd  . Pacemaker     Guidant Device  . Complication of anesthesia     hypotensive after back surgery in 2006- reports on chart   . Nonischemic cardiomyopathy   . Normal coronary arteries     s/p cardiac cath 2007    PAST SURGICAL HISTORY: Past Surgical History  Procedure Laterality Date  . Total knee arthroplasty  05/17/01    RIGHT KNEE  . Lumbar fusion  2006  . Mastectomy, partial  2008    GOT PACEMAKER AND DEFIB AT THAT TIME  . Breast surgery  2000    LUMP REMOVAL. STAGE 1 CANCER  . Joint replacement  06/14/01    right  . Pacemaker insertion  04/23/06  . Back surgery      lumbar fusion   . Cardiac catheterization    . Mass excision  11/08/2011    Procedure: EXCISION MASS;  Surgeon: [name], MD;  Location: [facility];  Service: General;  Laterality: Left;  Excision Left Thigh Mass  . Implantable cardioverter defibrillator generator change N/A 12/18/2012    Procedure: IMPLANTABLE CARDIOVERTER DEFIBRILLATOR GENERATOR CHANGE;  Surgeon: [name], MD;  Location: [facility] CATH LAB;  Service: Cardiovascular;  Laterality: N/A;  . Cataract extraction      FAMILY HISTORY: Family History  Problem Relation Age of Onset    . Hypertension Mother   . Hypertension Father   . Hypertension Brother     SOCIAL HISTORY:  History   Social History  . Marital Status: Married    Spouse Name: N/A  . Number of Children: 1  . Years of Education: Masters   Occupational History  . Retired    Social History Main Topics  . Smoking status: Never Smoker   . Smokeless tobacco: Never Used  . Alcohol Use: No  . Drug Use: No  . Sexual Activity: Not Currently    Birth Control/ Protection: Post-menopausal   Other Topics  Concern  . Not on file   Social History Narrative   Lives at home with husband.   Right-handed.   No caffeine use.     PHYSICAL EXAM   Filed Vitals:   07/16/14 1033  BP: 124/65  Pulse: 62  Height: 4\' 10"  (1.473 m)  Weight: 126 lb (57.153 kg)    Not recorded      Body mass index is 26.34 kg/(m^2).  PHYSICAL EXAMNIATION:  Gen: NAD, conversant, well nourised, obese, well groomed                     Cardiovascular: Regular rate rhythm, no peripheral edema, warm, nontender. Eyes: Conjunctivae clear without exudates or hemorrhage Neck: Supple, no carotid bruise. Pulmonary: Clear to auscultation bilaterally   NEUROLOGICAL EXAM:  MENTAL STATUS: Speech:    Speech is normal; fluent and spontaneous with normal comprehension.  Cognition: Mini-Mental Status Examination is 29 out of 30, she missed 1 out of 3 recalls    The patient is oriented to person, place, and time;    language fluent;     normal attention, concentration,     fund of knowledge.  CRANIAL NERVES: CN II: Visual fields are full to confrontation. Fundoscopic exam is normal with sharp discs and no vascular changes. Venous pulsations are present bilaterally. Pupils are 4 mm and briskly reactive to light. Visual acuity is 20/20 bilaterally. CN III, IV, VI: extraocular movement are normal. No ptosis. CN V: Facial sensation is intact to pinprick in all 3 divisions bilaterally. Corneal responses are intact.  CN VII: Face is  symmetric with normal eye closure and smile. CN VIII: Hearing is normal to rubbing fingers CN IX, X: Palate elevates symmetrically. Phonation is normal. CN XI: Head turning and shoulder shrug are intact CN XII: Tongue is midline with normal movements and no atrophy.  MOTOR: There is no pronator drift of out-stretched arms. Muscle bulk and tone are normal. Muscle strength is normal.   Shoulder abduction Shoulder external rotation Elbow flexion Elbow extension Wrist flexion Wrist extension Finger abduction Hip flexion Knee flexion Knee extension Ankle dorsi flexion Ankle plantar flexion  R 5 5 5 5 5 5 5 5 5 5 5 5   L 5 5 5 5 5 5 5 5 5 5 5 5     REFLEXES: Reflexes are 2+ and symmetric at the biceps, triceps, knees, and ankles. Plantar responses are flexor.  SENSORY: Light touch, pinprick, position sense, and vibration sense are intact in fingers and toes.  COORDINATION: Rapid alternating movements and fine finger movements are intact. There is no dysmetria on finger-to-nose and heel-knee-shin. There are no abnormal or extraneous movements.   GAIT/STANCE: Posture is cautious, antalgic gait   DIAGNOSTIC DATA (LABS, IMAGING, TESTING) - I reviewed patient records, labs, notes, testing and imaging myself where available.  Lab Results  Component Value Date   WBC 10.1 02/23/2013   HGB 13.2 02/23/2013   HCT 42.8 02/23/2013   MCV 91.2 02/23/2013   PLT 154 01/02/2013      Component Value Date/Time   NA 137 01/02/2013 1040   K 4.2 01/02/2013 1040   CL 101 01/02/2013 1040   CO2 24 01/02/2013 1040   GLUCOSE 96 01/02/2013 1040   BUN 25* 01/02/2013 1040   CREATININE 0.93 01/02/2013 1040   CALCIUM 10.0 01/02/2013 1040   PROT 7.1 01/02/2013 1040   ALBUMIN 3.5 01/02/2013 1040   AST 24 01/02/2013 1040   ALT 21 01/02/2013 1040   ALKPHOS  74 01/02/2013 1040   BILITOT 0.2* 01/02/2013 1040   GFRNONAA 58* 01/02/2013 1040   GFRAA 67* 01/02/2013 1040   Lab Results  Component Value Date    CHOL  01/02/2010    101        ATP III CLASSIFICATION:  <200     mg/dL   Desirable  200-239  mg/dL   Borderline High  >=240    mg/dL   High          HDL 32* 01/02/2010   LDLCALC  01/02/2010    39        Total Cholesterol/HDL:CHD Risk Coronary Heart Disease Risk Table                     Men   Women  1/2 Average Risk   3.4   3.3  Average Risk       5.0   4.4  2 X Average Risk   9.6   7.1  3 X Average Risk  23.4   11.0        Use the calculated Patient Ratio above and the CHD Risk Table to determine the patient's CHD Risk.        ATP III CLASSIFICATION (LDL):  <100     mg/dL   Optimal  100-129  mg/dL   Near or Above                    Optimal  130-159  mg/dL   Borderline  160-189  mg/dL   High  >190     mg/dL   Very High   TRIG 148 01/02/2010   CHOLHDL 3.2 01/02/2010   No results found for: HGBA1C No results found for: VITAMINB12 Lab Results  Component Value Date   TSH 3.488 01/02/2010    ASSESSMENT AND PLAN  [name] is a 81 y.o. female  with gradual onset memory trouble, Mini-Mental Status Examination 28 out of 30, she has a history of bipolar disorder, on polypharmacy treatment, status post ICD placement, complains of intermittent episode of dizziness, one severe episode fell to the ground, with body shaking,  We have reviewed CAT scan, mild atrophy, small vessel disease, no acute lesions, her symptoms overall has been stable, mild dizziness likely due to medicine side effect, orthostatic blood pressure changes, Continue moderate exercise, return to clinic in 6 months   Marcial Pacas, M.D. Ph.D.  [facility] [address] [place],  [address] Ph: [phone] Fax: [phone]

## 2014-07-28 ENCOUNTER — Encounter: Payer: Self-pay | Admitting: Internal Medicine

## 2014-07-28 ENCOUNTER — Ambulatory Visit (INDEPENDENT_AMBULATORY_CARE_PROVIDER_SITE_OTHER): Payer: Medicare Other | Admitting: Internal Medicine

## 2014-07-28 VITALS — BP 114/70 | HR 80 | Temp 97.6°F | Resp 12 | Ht <= 58 in | Wt 123.0 lb

## 2014-07-28 DIAGNOSIS — R42 Dizziness and giddiness: Secondary | ICD-10-CM

## 2014-07-28 DIAGNOSIS — F329 Major depressive disorder, single episode, unspecified: Secondary | ICD-10-CM | POA: Diagnosis not present

## 2014-07-28 DIAGNOSIS — I10 Essential (primary) hypertension: Secondary | ICD-10-CM | POA: Diagnosis not present

## 2014-07-28 DIAGNOSIS — E038 Other specified hypothyroidism: Secondary | ICD-10-CM | POA: Diagnosis not present

## 2014-07-28 DIAGNOSIS — R202 Paresthesia of skin: Secondary | ICD-10-CM | POA: Diagnosis not present

## 2014-07-28 DIAGNOSIS — R413 Other amnesia: Secondary | ICD-10-CM | POA: Diagnosis not present

## 2014-07-28 DIAGNOSIS — R200 Anesthesia of skin: Secondary | ICD-10-CM | POA: Insufficient documentation

## 2014-07-28 DIAGNOSIS — Z95810 Presence of automatic (implantable) cardiac defibrillator: Secondary | ICD-10-CM

## 2014-07-28 DIAGNOSIS — F32A Depression, unspecified: Secondary | ICD-10-CM

## 2014-07-28 DIAGNOSIS — R634 Abnormal weight loss: Secondary | ICD-10-CM

## 2014-07-28 DIAGNOSIS — I5022 Chronic systolic (congestive) heart failure: Secondary | ICD-10-CM | POA: Diagnosis not present

## 2014-07-28 DIAGNOSIS — G250 Essential tremor: Secondary | ICD-10-CM

## 2014-07-28 DIAGNOSIS — E034 Atrophy of thyroid (acquired): Secondary | ICD-10-CM | POA: Diagnosis not present

## 2014-07-28 MED ORDER — ZOSTER VACCINE LIVE 19400 UNT/0.65ML ~~LOC~~ SOLR: 0.6500 mL | Freq: Once | SUBCUTANEOUS | Status: DC

## 2014-07-28 NOTE — Progress Notes (Signed)
Patient ID: [name], female   DOB: 1933-12-26, 81 y.o.   MRN: [id number]     Facility  [facility]    Place of Service:   OFFICE    Allergies  Allergen Reactions  . Shellfish Allergy Shortness Of Breath  . Iodine     Iodine contrast  . Aspirin Nausea And Vomiting  . Codeine Nausea And Vomiting    Chief Complaint  Patient presents with  . Establish Care    New patient establish care, patient will go blank in the middle of a sentence. Patient c/o memory concerns x couple months . MMSE 27/30 (passed clock drawing)  . Tremors    Patient with increased tremours   . Hoarse    Patient with increased hoarseness since pollen started, prior it was present but not as bad.   . Immunizations    RX printed for zoster vaccine, other vaccines UTD     HPI:  81 yo female seen today as a new pt. She has a few concerns. She as tremors that appear as the day progresses and believes it is related to stress. She easily forgets the subject she is talking about. She sees neurology Dr Krista Blue for memory loss. She told neuro she was dizzy also. Neuro believes dizziness med induced. She was told to continue exercise program and RTO in 6 mos. Memory loss stable. She sees Dr Reece Levy for mx of psych issues (depression, bipolar d/o  She has lost about 20 lbs in th last 2-3 yrs unintentionally.  She has hoarseness since spring weather and increased pollen. She takes nothing for it. She does gargle with warm salt water.  She has HTN and ICD insertion after syncopal episode while driving. EF 30% at the time but now 50%  Hx triple neg breast cancer but no longer sees oncology. She has been breast cancer free since 2008 She needs Rx for zostavax   Past Medical History  Diagnosis Date  . Syncope   . Systolic CHF   . Depression   . ICD (implantable cardiac defibrillator), biventricular, in situ   . LBBB (left bundle branch block)   . Dyslipidemia   . CHF (congestive heart failure)     PACEMAKER & DEFIB  .  Fainted 04/21/06    AT [place]  . Hypertension   . Arthritis   . Wears glasses   . Headache(784.0)   . GERD (gastroesophageal reflux disease)   . HLD (hyperlipidemia)   . Hypothyroidism   . Cancer     left breast cancer   . ICD (implantable cardiac defibrillator) in place     pt has pacer/icd  . Pacemaker     Guidant Device  . Complication of anesthesia     hypotensive after back surgery in 2006- reports on chart   . Nonischemic cardiomyopathy   . Normal coronary arteries     s/p cardiac cath 2007  . Memory loss   . Hearing loss   . Vertigo    Past Surgical History  Procedure Laterality Date  . Total knee arthroplasty  05/17/01    RIGHT KNEE  . Lumbar fusion  2006  . Mastectomy, partial  2008    GOT PACEMAKER AND DEFIB AT THAT TIME  . Breast surgery  2000    LUMP REMOVAL. STAGE 1 CANCER  . Joint replacement  06/14/01    right  . Pacemaker insertion  04/23/06  . Back surgery      lumbar fusion   .  Cardiac catheterization    . Mass excision  11/08/2011    Procedure: EXCISION MASS;  Surgeon: [name], MD;  Location: [facility];  Service: General;  Laterality: Left;  Excision Left Thigh Mass  . Implantable cardioverter defibrillator generator change N/A 12/18/2012    Procedure: IMPLANTABLE CARDIOVERTER DEFIBRILLATOR GENERATOR CHANGE;  Surgeon: [name], MD;  Location: [facility] CATH LAB;  Service: Cardiovascular;  Laterality: N/A;  . Cataract extraction     History   Social History  . Marital Status: Married    Spouse Name: N/A  . Number of Children: 1  . Years of Education: Masters   Occupational History  . Retired    Social History Main Topics  . Smoking status: Never Smoker   . Smokeless tobacco: Never Used  . Alcohol Use: No  . Drug Use: No  . Sexual Activity: Not Currently    Birth Control/ Protection: Post-menopausal   Other Topics Concern  . None   Social History Narrative   Lives at home with husband.   Right-handed.      As of 07/28/2014   Diet: No  special diet   Caffeine: yes, Chocolate, tea and sodas    Married: YES, 1970   House: Yes, 2 stories, 2-3 persons live in home   Pets: No   Current/Past profession: Engineer, mining, Designer, jewellery    Exercise: Yes 2-3 x weekly   Living Will: Yes   DNR: No   POA/HPOA: No         Family History  Problem Relation Age of Onset  . Hypertension Mother   . Hypertension Father   . Hypertension Brother   . Hypertension Brother   . Arthritis Mother      Medications: Patient's Medications  New Prescriptions   No medications on file  Previous Medications   ACETAMINOPHEN (TYLENOL) 500 MG TABLET    500 mg. 2 by mouth in am, 2 by mouth in pm   BUPROPION (WELLBUTRIN XL) 150 MG 24 HR TABLET    Take 450 mg by mouth daily.   CALCIUM CARBONATE (OS-CAL) 600 MG TABS TABLET    Take 600 mg by mouth daily with breakfast.   CHOLECALCIFEROL (VITAMIN D-3) 1000 UNITS CAPS    Take by mouth daily.   COENZYME Q10 (COQ10) 200 MG CAPS    Take by mouth daily.   FUROSEMIDE (LASIX) 40 MG TABLET    40 mg in the morning and 20 mg at night   LEVOTHYROXINE (SYNTHROID, LEVOTHROID) 100 MCG TABLET    Take 100 mcg by mouth daily before breakfast.   MAGNESIUM 400 MG CAPS    Take by mouth daily.   METOPROLOL TARTRATE (LOPRESSOR) 25 MG TABLET    25 mg. 1 by mouth in the morning, 1 by mouth in the evening   OLMESARTAN (BENICAR) 40 MG TABLET    Take 40 mg by mouth every morning.    OMEGA-3 FATTY ACIDS (FISH OIL) 1200 MG CAPS    Take by mouth daily.   OMEPRAZOLE (PRILOSEC) 40 MG CAPSULE    Take 40 mg by mouth every morning.    OXCARBAZEPINE (TRILEPTAL) 300 MG TABLET    Take 300 mg by mouth daily.    POTASSIUM CHLORIDE (K-DUR) 10 MEQ TABLET    Take 10 mEq by mouth once.    SERTRALINE (ZOLOFT) 100 MG TABLET    100 mg. 2 by mouth once daily   SPIRONOLACTONE (ALDACTONE) 25 MG TABLET    Take 25 mg by  mouth every morning.    VITAMIN E 1000 UNIT CAPSULE    Take 1,000 Units by mouth daily.  Modified Medications   Modified  Medication Previous Medication   ZOSTER VACCINE LIVE, PF, (ZOSTAVAX) 40981 UNT/0.65ML INJECTION zoster vaccine live, PF, (ZOSTAVAX) 19147 UNT/0.65ML injection      Inject 19,400 Units into the skin once.    Inject 0.65 mLs into the skin once.  Discontinued Medications   No medications on file     Review of Systems  Constitutional: Positive for fatigue and unexpected weight change (weight loss).  HENT: Positive for hearing loss.        Dry eyes; dry mouth  Endocrine: Positive for cold intolerance and heat intolerance.  Genitourinary: Positive for decreased urine volume.       Breast pain  Musculoskeletal: Positive for myalgias, back pain and arthralgias.  Skin: Positive for color change.  Neurological: Positive for dizziness, tremors and headaches.       Memory loss  Hematological: Bruises/bleeds easily.  Psychiatric/Behavioral: Positive for confusion and dysphoric mood. The patient is nervous/anxious.     Filed Vitals:   07/28/14 1312  BP: 114/70  Pulse: 80  Temp: 97.6 F (36.4 C)  TempSrc: Oral  Resp: 12  Height: 4\' 9"  (1.448 m)  Weight: 123 lb (55.792 kg)  SpO2: 96%   Body mass index is 26.61 kg/(m^2).  Physical Exam  Constitutional: She is oriented to person, place, and time. She appears well-developed and well-nourished.  HENT:  Mouth/Throat: Oropharynx is clear and moist. No oropharyngeal exudate.  Eyes: Pupils are equal, round, and reactive to light. No scleral icterus.  Neck: Neck supple. No tracheal deviation present. No thyromegaly present.  Cardiovascular: Normal rate, regular rhythm, normal heart sounds and intact distal pulses.  Exam reveals no gallop and no friction rub.   No murmur heard. No LE edema b/l. no calf TTP. No carotid bruit b/l  Pulmonary/Chest: Effort normal and breath sounds normal. No stridor. No respiratory distress. She has no wheezes. She has no rales.  Left ACW ICD palpable  Abdominal: Soft. Bowel sounds are normal. She exhibits no  distension and no mass. There is no tenderness. There is no rebound and no guarding.  Musculoskeletal: She exhibits edema (left knee).  Lymphadenopathy:    She has no cervical adenopathy.  Neurological: She is alert and oriented to person, place, and time. She has normal reflexes. She displays tremor. Gait abnormal.  Skin: Skin is warm and dry. No rash noted.  Psychiatric: She has a normal mood and affect. Her behavior is normal. Judgment and thought content normal.     Labs reviewed: Office Visit on 05/06/2014  Component Date Value Ref Range Status  . Date Time Interrogation Session 05/06/2014 [id number]   Final  . Implantable Pulse Generator Manufa* 05/06/2014 [employer]   Final  . Implantable Pulse Generator Model 05/06/2014 N141 ENERGEN CRT-D   Final  . Implantable Pulse Generator Serial* 05/06/2014 [id number]   Final  . Lead Channel Setting Sensing Sensi* 05/06/2014 0.6   Final  . Lead Channel Setting Sensing Adapt* 05/06/2014 Adaptive Sensing   Final  . Lead Channel Setting Sensing Sensi* 05/06/2014 1.0   Final  . Lead Channel Setting Sensing Adapt* 05/06/2014 Adaptive Sensing   Final  . Lead Channel Setting Pacing Amplit* 05/06/2014 2.0   Final  . Lead Channel Setting Pacing Pulse * 05/06/2014 0.4   Final  . Lead Channel Setting Pacing Amplit* 05/06/2014 2.0   Final  . Lead  Channel Setting Pacing Pulse * 05/06/2014 0.8   Final  . Lead Channel Setting Pacing Amplit* 05/06/2014 2.4   Final  . Lead Channel Setting Pacing Captur* 05/06/2014 Fixed Pacing   Final  . Zone Setting Type Category 05/06/2014 VF   Final  . Zone Setting Detection Interval 05/06/2014 273.0   Final  . Zone Setting Type Category 05/06/2014 VT   Final  . Zone Setting Detection Interval 05/06/2014 300.0   Final  . Zone Setting Type Category 05/06/2014 [id number]_TACHYCARDIA_1   Final  . Zone Setting Detection Interval 05/06/2014 375.0   Final  . Lead Channel Impedance Value 05/06/2014 743.0   Final  .  Lead Channel Sensing Intrinsic Amp* 05/06/2014 25*  Final  . Lead Channel Pacing Threshold Ampl* 05/06/2014 1.0000   Final  . Lead Channel Pacing Threshold Puls* 05/06/2014 0.8   Final  . Lead Channel Impedance Value 05/06/2014 424.0   Final  . Lead Channel Sensing Intrinsic Amp* 05/06/2014 6.8   Final  . Lead Channel Pacing Threshold Ampl* 05/06/2014 0.7000   Final  . Lead Channel Pacing Threshold Puls* 05/06/2014 0.4   Final  . Lead Channel Impedance Value 05/06/2014 556.0   Final  . Lead Channel Sensing Intrinsic Amp* 05/06/2014 23.0   Final  . Lead Channel Pacing Threshold Ampl* 05/06/2014 0.7000   Final  . Lead Channel Pacing Threshold Puls* 05/06/2014 0.4   Final  . HighPow Impedance 05/06/2014 49.0   Final  . Battery Status 05/06/2014 BOS   Final  . Eval Rhythm 05/06/2014 SR@69    Final  . Miscellaneous Comment 05/06/2014    Final                   Value:CRT-D device check in office. Thresholds and sensing consistent with previous device measurements. Lead impedance trends stable over time. No mode switch episodes recorded. No ventricular arrhythmia episodes recorded. Patient bi-ventricularly pacing 99%  of the time. Device programmed with appropriate safety margins. Estimated longevity 59yrs. Latitude 08/05/14 & ROV w/ GT in 89mo.      Assessment/Plan   ICD-9-CM ICD-10-CM   1. Loss of weight - questionable etiology 783.21 R63.4 CMP     CBC with Differential     Urinalysis with Reflex Microscopic  2. Numbness and tingling in right hand - possibly due to shoulder arthritis 782.0 R20.2 Vitamin B12  3. Dizziness - possibly due to vertigo 780.4 R42 Vitamin B12  4. Chronic systolic heart failure - stable on meds 428.22 I50.22   5. Benign hypertension - controlled on meds 401.1 I10   6. Essential tremor - possibly stress induced 333.1 G25.0   7. Memory loss - mild 780.93 R41.3 Vitamin B12  8. ICD (implantable cardioverter-defibrillator), biventricular, in situ - s/p syncopal episode  V45.02 Z95.810   9. Depression - on meds 311 F32.9 Vitamin B12  10. Hypothyroidism due to acquired atrophy of thyroid - cont med 244.8 E03.8 TSH   246.8 E03.4 T4, Free    --check labs. She is UTD on colonoscopy  --RTO in 1 monh for CPE  --continue current medications as ordered  --declines vestibular rehab for dizziness. May need ENT eval if hoarseness still present  --obtain old records  --keep appt with specialists  Arissa Fagin S. Perlie Gold  [facility] [address] [place], [place] [address] [phone] Office (Wednesdays and Fridays 8 AM - 5 PM) [phone] Cell (Monday-Friday 8 AM - 5 PM)

## 2014-07-28 NOTE — Progress Notes (Signed)
Passed clock drawing 

## 2014-07-28 NOTE — Patient Instructions (Addendum)
Reminder- bring copy of Living Will/POA  Continue all medications as ordered  Follow up in 1 month for CPE  Will call with lab results

## 2014-07-29 LAB — CBC WITH DIFFERENTIAL/PLATELET
Basophils Absolute: 0 10*3/uL (ref 0.0–0.2)
Basos: 0 %
Eos: 2 %
Eosinophils Absolute: 0.2 10*3/uL (ref 0.0–0.4)
HCT: 39 % (ref 34.0–46.6)
Hemoglobin: 12.8 g/dL (ref 11.1–15.9)
Immature Grans (Abs): 0 10*3/uL (ref 0.0–0.1)
Immature Granulocytes: 0 %
Lymphocytes Absolute: 2.6 10*3/uL (ref 0.7–3.1)
Lymphs: 29 %
MCH: 28.1 pg (ref 26.6–33.0)
MCHC: 32.8 g/dL (ref 31.5–35.7)
MCV: 86 fL (ref 79–97)
Monocytes Absolute: 0.7 10*3/uL (ref 0.1–0.9)
Monocytes: 8 %
Neutrophils Absolute: 5.3 10*3/uL (ref 1.4–7.0)
Neutrophils Relative %: 61 %
Platelets: 146 10*3/uL — ABNORMAL LOW (ref 150–379)
RBC: 4.56 x10E6/uL (ref 3.77–5.28)
RDW: 15.4 % (ref 12.3–15.4)
WBC: 8.8 10*3/uL (ref 3.4–10.8)

## 2014-07-29 LAB — T4, FREE: Free T4: 1.21 ng/dL (ref 0.82–1.77)

## 2014-07-29 LAB — COMPREHENSIVE METABOLIC PANEL
ALT: 26 IU/L (ref 0–32)
AST: 15 IU/L (ref 0–40)
Albumin/Globulin Ratio: 1.8 (ref 1.1–2.5)
Albumin: 4.5 g/dL (ref 3.5–4.8)
Alkaline Phosphatase: 73 IU/L (ref 39–117)
BUN/Creatinine Ratio: 28 — ABNORMAL HIGH (ref 11–26)
BUN: 28 mg/dL — ABNORMAL HIGH (ref 8–27)
Bilirubin Total: <0.2 mg/dL (ref 0.0–1.2)
CO2: 27 mmol/L (ref 18–29)
Calcium: 10.5 mg/dL — ABNORMAL HIGH (ref 8.7–10.3)
Chloride: 101 mmol/L (ref 97–108)
Creatinine, Ser: 0.99 mg/dL (ref 0.57–1.00)
GFR calc Af Amer: 63 mL/min/{1.73_m2} (ref >59)
GFR calc non Af Amer: 55 mL/min/{1.73_m2} — ABNORMAL LOW (ref >59)
Globulin, Total: 2.5 g/dL (ref 1.5–4.5)
Glucose: 51 mg/dL — ABNORMAL LOW (ref 65–99)
Potassium: 4.3 mmol/L (ref 3.5–5.2)
Sodium: 143 mmol/L (ref 134–144)
Total Protein: 7 g/dL (ref 6.0–8.5)

## 2014-07-29 LAB — URINALYSIS, ROUTINE W REFLEX MICROSCOPIC
Bilirubin, UA: NEGATIVE
Glucose, UA: NEGATIVE
Ketones, UA: NEGATIVE
Leukocytes, UA: NEGATIVE
Nitrite, UA: NEGATIVE
Protein, UA: NEGATIVE
RBC, UA: NEGATIVE
Specific Gravity, UA: 1.02 (ref 1.005–1.030)
Urobilinogen, Ur: 0.2 mg/dL (ref 0.2–1.0)
pH, UA: 6 (ref 5.0–7.5)

## 2014-07-29 LAB — TSH: TSH: 2.21 u[IU]/mL (ref 0.450–4.500)

## 2014-07-29 LAB — VITAMIN B12: Vitamin B-12: 1117 pg/mL — ABNORMAL HIGH (ref 211–946)

## 2014-08-05 ENCOUNTER — Ambulatory Visit (INDEPENDENT_AMBULATORY_CARE_PROVIDER_SITE_OTHER): Payer: Medicare Other | Admitting: *Deleted

## 2014-08-05 DIAGNOSIS — I429 Cardiomyopathy, unspecified: Secondary | ICD-10-CM

## 2014-08-05 DIAGNOSIS — I428 Other cardiomyopathies: Secondary | ICD-10-CM

## 2014-08-05 NOTE — Progress Notes (Signed)
Remote ICD transmission.   

## 2014-08-06 LAB — MDC_IDC_ENUM_SESS_TYPE_REMOTE
Battery Remaining Longevity: 96 mo
Battery Remaining Percentage: 100 %
Brady Statistic RA Percent Paced: 0 %
Brady Statistic RV Percent Paced: 94 %
Date Time Interrogation Session: 20160428112800
HighPow Impedance: 49 Ohm
Implantable Pulse Generator Serial Number: 111765
Lead Channel Impedance Value: 415 Ohm
Lead Channel Impedance Value: 569 Ohm
Lead Channel Impedance Value: 767 Ohm
Lead Channel Pacing Threshold Amplitude: 0.7 V
Lead Channel Pacing Threshold Amplitude: 0.7 V
Lead Channel Pacing Threshold Amplitude: 1 V
Lead Channel Pacing Threshold Pulse Width: 0.4 ms
Lead Channel Pacing Threshold Pulse Width: 0.4 ms
Lead Channel Pacing Threshold Pulse Width: 0.8 ms
Lead Channel Setting Pacing Amplitude: 2 V
Lead Channel Setting Pacing Amplitude: 2 V
Lead Channel Setting Pacing Amplitude: 2.4 V
Lead Channel Setting Pacing Pulse Width: 0.4 ms
Lead Channel Setting Pacing Pulse Width: 0.8 ms
Lead Channel Setting Sensing Sensitivity: 0.6 mV
Lead Channel Setting Sensing Sensitivity: 1 mV
Zone Setting Detection Interval: 273 ms
Zone Setting Detection Interval: 300 ms
Zone Setting Detection Interval: 375 ms

## 2014-08-19 ENCOUNTER — Encounter: Payer: Self-pay | Admitting: Cardiology

## 2014-08-26 ENCOUNTER — Encounter: Payer: Self-pay | Admitting: Internal Medicine

## 2014-08-27 ENCOUNTER — Encounter: Payer: Self-pay | Admitting: Internal Medicine

## 2014-08-27 ENCOUNTER — Ambulatory Visit (INDEPENDENT_AMBULATORY_CARE_PROVIDER_SITE_OTHER): Payer: Medicare Other | Admitting: Internal Medicine

## 2014-08-27 VITALS — BP 116/78 | HR 71 | Temp 97.9°F | Resp 20 | Ht <= 58 in | Wt 123.4 lb

## 2014-08-27 DIAGNOSIS — I5022 Chronic systolic (congestive) heart failure: Secondary | ICD-10-CM | POA: Diagnosis not present

## 2014-08-27 DIAGNOSIS — G250 Essential tremor: Secondary | ICD-10-CM

## 2014-08-27 DIAGNOSIS — E034 Atrophy of thyroid (acquired): Secondary | ICD-10-CM

## 2014-08-27 DIAGNOSIS — F329 Major depressive disorder, single episode, unspecified: Secondary | ICD-10-CM

## 2014-08-27 DIAGNOSIS — R233 Spontaneous ecchymoses: Secondary | ICD-10-CM | POA: Diagnosis not present

## 2014-08-27 DIAGNOSIS — R42 Dizziness and giddiness: Secondary | ICD-10-CM

## 2014-08-27 DIAGNOSIS — E038 Other specified hypothyroidism: Secondary | ICD-10-CM | POA: Diagnosis not present

## 2014-08-27 DIAGNOSIS — I10 Essential (primary) hypertension: Secondary | ICD-10-CM

## 2014-08-27 DIAGNOSIS — R413 Other amnesia: Secondary | ICD-10-CM | POA: Diagnosis not present

## 2014-08-27 DIAGNOSIS — R634 Abnormal weight loss: Secondary | ICD-10-CM

## 2014-08-27 DIAGNOSIS — F32A Depression, unspecified: Secondary | ICD-10-CM

## 2014-08-27 NOTE — Patient Instructions (Signed)
Continue current medications as ordered. Recommend pick up new bottle of OTC Meclizine  Follow up with Dr Reece Levy as scheduled  Return to office in 3 mos for CPE
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
## 2014-09-02 ENCOUNTER — Encounter: Payer: Self-pay | Admitting: Cardiology

## 2014-10-04 ENCOUNTER — Other Ambulatory Visit: Payer: Self-pay | Admitting: *Deleted

## 2014-10-04 MED ORDER — SPIRONOLACTONE 25 MG PO TABS: 25.0000 mg | ORAL_TABLET | Freq: Every morning | ORAL | Status: DC

## 2014-10-04 NOTE — Telephone Encounter (Signed)
[facility] 

## 2014-10-22 ENCOUNTER — Other Ambulatory Visit: Payer: Self-pay | Admitting: *Deleted

## 2014-10-22 MED ORDER — OMEPRAZOLE 40 MG PO CPDR: DELAYED_RELEASE_CAPSULE | ORAL | Status: DC

## 2014-10-22 NOTE — Telephone Encounter (Signed)
[facility] 

## 2014-10-28 ENCOUNTER — Other Ambulatory Visit: Payer: Self-pay

## 2014-10-28 MED ORDER — OLMESARTAN MEDOXOMIL 40 MG PO TABS: 40.0000 mg | ORAL_TABLET | Freq: Every morning | ORAL | Status: DC

## 2014-11-01 ENCOUNTER — Other Ambulatory Visit: Payer: Self-pay | Admitting: *Deleted

## 2014-11-01 MED ORDER — OLMESARTAN MEDOXOMIL 40 MG PO TABS: 40.0000 mg | ORAL_TABLET | Freq: Every morning | ORAL | Status: DC

## 2014-11-01 NOTE — Telephone Encounter (Signed)
[facility] 

## 2014-11-04 ENCOUNTER — Ambulatory Visit (INDEPENDENT_AMBULATORY_CARE_PROVIDER_SITE_OTHER): Payer: Medicare Other

## 2014-11-04 DIAGNOSIS — I428 Other cardiomyopathies: Secondary | ICD-10-CM

## 2014-11-04 DIAGNOSIS — I429 Cardiomyopathy, unspecified: Secondary | ICD-10-CM | POA: Diagnosis not present

## 2014-11-08 ENCOUNTER — Telehealth: Payer: Self-pay

## 2014-11-08 ENCOUNTER — Telehealth: Payer: Self-pay | Admitting: Interventional Cardiology

## 2014-11-08 NOTE — Telephone Encounter (Signed)
Pt aware cardiac clearance has been faxed this afternoon

## 2014-11-08 NOTE — Telephone Encounter (Signed)
New message      Did we get a fax clearance from Dr Alvan Dame for knee replacement?  Please call pt and let her know

## 2014-11-08 NOTE — Telephone Encounter (Signed)
Signed cardiac clearance place in MR nurse fax box to be faxed to [facility]

## 2014-11-11 NOTE — Progress Notes (Signed)
Remote ICD transmission.   

## 2014-11-16 LAB — CUP PACEART REMOTE DEVICE CHECK
Battery Remaining Longevity: 96 mo
Battery Remaining Percentage: 100 %
Brady Statistic RA Percent Paced: 0 %
Brady Statistic RV Percent Paced: 96 %
Date Time Interrogation Session: 20160728142500
HighPow Impedance: 49 Ohm
Lead Channel Impedance Value: 429 Ohm
Lead Channel Impedance Value: 600 Ohm
Lead Channel Impedance Value: 822 Ohm
Lead Channel Sensing Intrinsic Amplitude: 4.9 mV
Lead Channel Setting Pacing Amplitude: 2 V
Lead Channel Setting Pacing Amplitude: 2 V
Lead Channel Setting Pacing Amplitude: 2.4 V
Lead Channel Setting Pacing Pulse Width: 0.4 ms
Lead Channel Setting Pacing Pulse Width: 0.8 ms
Lead Channel Setting Sensing Sensitivity: 0.6 mV
Lead Channel Setting Sensing Sensitivity: 1 mV
Pulse Gen Serial Number: 111765
Zone Setting Detection Interval: 273 ms
Zone Setting Detection Interval: 300 ms
Zone Setting Detection Interval: 375 ms
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
## 2014-11-18 NOTE — Patient Instructions (Addendum)
[name]  11/18/2014   Your procedure is scheduled on: November 29, 2014  Report to [facility] Main  Entrance take [place]  elevators to 3rd floor to  [place] at  5:30 AM.  Call this number if you have problems the morning of surgery [phone]   Remember: ONLY 1 PERSON MAY GO WITH YOU TO SHORT STAY TO GET  READY MORNING OF [place].  Do not eat food or drink liquids :After Midnight.     Take these medicines the morning of surgery with A SIP OF WATER: Wellbutrin XL, Levothyroxine, Metoprolol (lopressor), Prilosec, Zoloft, Oxcarbazepine (Trileptal)                               You may not have any metal on your body including hair pins and              piercings  Do not wear jewelry, make-up, lotions, powders or perfumes, deodorant             Do not wear nail polish.  Do not shave  48 hours prior to surgery.  .   Do not bring valuables to the hospital. [place].  Contacts, dentures or bridgework may not be worn into surgery.  Leave suitcase in the car. After surgery it may be brought to your room.         Special Instructions: coughing and deep breathing exercises, leg exercises              Please read over the following fact sheets you were given: _____________________________________________________________________             [facility] - Preparing for Surgery Before surgery, you can play an important role.  Because skin is not sterile, your skin needs to be as free of germs as possible.  You can reduce the number of germs on your skin by washing with CHG (chlorahexidine gluconate) soap before surgery.  CHG is an antiseptic cleaner which kills germs and bonds with the skin to continue killing germs even after washing. Please DO NOT use if you have an allergy to CHG or antibacterial soaps.  If your skin becomes reddened/irritated stop using the CHG and inform your nurse when you arrive  at Short Stay. Do not shave (including legs and underarms) for at least 48 hours prior to the first CHG shower.  You may shave your face/neck. Please follow these instructions carefully:  1.  Shower with CHG Soap the night before surgery and the  morning of Surgery.  2.  If you choose to wash your hair, wash your hair first as usual with your  normal  shampoo.  3.  After you shampoo, rinse your hair and body thoroughly to remove the  shampoo.                           4.  Use CHG as you would any other liquid soap.  You can apply chg directly  to the skin and wash                       Gently with a scrungie or clean washcloth.  5.  Apply the CHG Soap to your body ONLY FROM THE NECK DOWN.   Do not use on face/ open                           Wound or open sores. Avoid contact with eyes, ears mouth and genitals (private parts).                       Wash face,  Genitals (private parts) with your normal soap.             6.  Wash thoroughly, paying special attention to the area where your surgery  will be performed.  7.  Thoroughly rinse your body with warm water from the neck down.  8.  DO NOT shower/wash with your normal soap after using and rinsing off  the CHG Soap.                9.  Pat yourself dry with a clean towel.            10.  Wear clean pajamas.            11.  Place clean sheets on your bed the night of your first shower and do not  sleep with pets. Day of Surgery : Do not apply any lotions/deodorants the morning of surgery.  Please wear clean clothes to the hospital/surgery center.  FAILURE TO FOLLOW THESE INSTRUCTIONS MAY RESULT IN THE CANCELLATION OF YOUR SURGERY PATIENT SIGNATURE_________________________________  NURSE SIGNATURE__________________________________  ________________________________________________________________________  WHAT IS A BLOOD TRANSFUSION? Blood Transfusion Information  A transfusion is the replacement of blood or some of its parts. Blood is made  up of multiple cells which provide different functions.  Red blood cells carry oxygen and are used for blood loss replacement.  White blood cells fight against infection.  Platelets control bleeding.  Plasma helps clot blood.  Other blood products are available for specialized needs, such as hemophilia or other clotting disorders. BEFORE THE TRANSFUSION  Who gives blood for transfusions?   Healthy volunteers who are fully evaluated to make sure their blood is safe. This is blood bank blood. Transfusion therapy is the safest it has ever been in the practice of medicine. Before blood is taken from a donor, a complete history is taken to make sure that person has no history of diseases nor engages in risky social behavior (examples are intravenous drug use or sexual activity with multiple partners). The donor's travel history is screened to minimize risk of transmitting infections, such as malaria. The donated blood is tested for signs of infectious diseases, such as HIV and hepatitis. The blood is then tested to be sure it is compatible with you in order to minimize the chance of a transfusion reaction. If you or a relative donates blood, this is often done in anticipation of surgery and is not appropriate for emergency situations. It takes many days to process the donated blood. RISKS AND COMPLICATIONS Although transfusion therapy is very safe and saves many lives, the main dangers of transfusion include:  1. Getting an infectious disease. 2. Developing a transfusion reaction. This is an allergic reaction to something in the blood you were given. Every precaution is taken to prevent this. The decision to have a blood transfusion has been considered carefully by your caregiver before blood is given. Blood is not given unless the benefits outweigh the risks. AFTER THE TRANSFUSION  Right after receiving a  blood transfusion, you will usually feel much better and more energetic. This is especially  true if your red blood cells have gotten low (anemic). The transfusion raises the level of the red blood cells which carry oxygen, and this usually causes an energy increase.  The nurse administering the transfusion will monitor you carefully for complications. HOME CARE INSTRUCTIONS  No special instructions are needed after a transfusion. You may find your energy is better. Speak with your caregiver about any limitations on activity for underlying diseases you may have. SEEK MEDICAL CARE IF:   Your condition is not improving after your transfusion.  You develop redness or irritation at the intravenous (IV) site. SEEK IMMEDIATE MEDICAL CARE IF:  Any of the following symptoms occur over the next 12 hours:  Shaking chills.  You have a temperature by mouth above 102 F (38.9 C), not controlled by medicine.  Chest, back, or muscle pain.  People around you feel you are not acting correctly or are confused.  Shortness of breath or difficulty breathing.  Dizziness and fainting.  You get a rash or develop hives.  You have a decrease in urine output.  Your urine turns a dark color or changes to pink, red, or brown. Any of the following symptoms occur over the next 10 days:  You have a temperature by mouth above 102 F (38.9 C), not controlled by medicine.  Shortness of breath.  Weakness after normal activity.  The white part of the eye turns yellow (jaundice).  You have a decrease in the amount of urine or are urinating less often.  Your urine turns a dark color or changes to pink, red, or brown. Document Released: 03/23/2000 Document Revised: 06/18/2011 Document Reviewed: 11/10/2007 ExitCare Patient Information 2014 [employer].  _______________________________________________________________________  Incentive Spirometer  An incentive spirometer is a tool that can help keep your lungs clear and active. This tool measures how well you are filling your lungs with each  breath. Taking long deep breaths may help reverse or decrease the chance of developing breathing (pulmonary) problems (especially infection) following:  A long period of time when you are unable to move or be active. BEFORE THE PROCEDURE   If the spirometer includes an indicator to show your best effort, your nurse or respiratory therapist will set it to a desired goal.  If possible, sit up straight or lean slightly forward. Try not to slouch.  Hold the incentive spirometer in an upright position. INSTRUCTIONS FOR USE  3. Sit on the edge of your bed if possible, or sit up as far as you can in bed or on a chair. 4. Hold the incentive spirometer in an upright position. 5. Breathe out normally. 6. Place the mouthpiece in your mouth and seal your lips tightly around it. 7. Breathe in slowly and as deeply as possible, raising the piston or the ball toward the top of the column. 8. Hold your breath for 3-5 seconds or for as long as possible. Allow the piston or ball to fall to the bottom of the column. 9. Remove the mouthpiece from your mouth and breathe out normally. 10. Rest for a few seconds and repeat Steps 1 through 7 at least 10 times every 1-2 hours when you are awake. Take your time and take a few normal breaths between deep breaths. 11. The spirometer may include an indicator to show your best effort. Use the indicator as a goal to work toward during each repetition. 12. After each set of 10  deep breaths, practice coughing to be sure your lungs are clear. If you have an incision (the cut made at the time of surgery), support your incision when coughing by placing a pillow or rolled up towels firmly against it. Once you are able to get out of bed, walk around indoors and cough well. You may stop using the incentive spirometer when instructed by your caregiver.  RISKS AND COMPLICATIONS  Take your time so you do not get dizzy or light-headed.  If you are in pain, you may need to take or ask  for pain medication before doing incentive spirometry. It is harder to take a deep breath if you are having pain. AFTER USE  Rest and breathe slowly and easily.  It can be helpful to keep track of a log of your progress. Your caregiver can provide you with a simple table to help with this. If you are using the spirometer at home, follow these instructions: [place] IF:   You are having difficultly using the spirometer.  You have trouble using the spirometer as often as instructed.  Your pain medication is not giving enough relief while using the spirometer.  You develop fever of 100.5 F (38.1 C) or higher. SEEK IMMEDIATE MEDICAL CARE IF:   You cough up bloody sputum that had not been present before.  You develop fever of 102 F (38.9 C) or greater.  You develop worsening pain at or near the incision site. MAKE SURE YOU:   Understand these instructions.  Will watch your condition.  Will get help right away if you are not doing well or get worse. Document Released: 08/06/2006 Document Revised: 06/18/2011 Document Reviewed: 10/07/2006 [facility] Patient Information 2014 [place], [place].   ________________________________________________________________________

## 2014-11-18 NOTE — Progress Notes (Signed)
11-04-14 - (ICD) - Implantable Device - Remote - EPIC

## 2014-11-22 ENCOUNTER — Encounter (HOSPITAL_COMMUNITY)
Admission: RE | Admit: 2014-11-22 | Discharge: 2014-11-22 | Disposition: A | Discharge status: Home / Self Care | Payer: Medicare Other | Source: Ambulatory Visit | Attending: Orthopedic Surgery | Admitting: Orthopedic Surgery

## 2014-11-22 ENCOUNTER — Encounter (HOSPITAL_COMMUNITY): Payer: Self-pay

## 2014-11-22 DIAGNOSIS — Z01818 Encounter for other preprocedural examination: Secondary | ICD-10-CM | POA: Insufficient documentation

## 2014-11-22 DIAGNOSIS — M179 Osteoarthritis of knee, unspecified: Secondary | ICD-10-CM | POA: Insufficient documentation

## 2014-11-22 LAB — URINALYSIS, ROUTINE W REFLEX MICROSCOPIC
Bilirubin Urine: NEGATIVE
Glucose, UA: NEGATIVE mg/dL
Hgb urine dipstick: NEGATIVE
Ketones, ur: NEGATIVE mg/dL
Leukocytes, UA: NEGATIVE
Nitrite: NEGATIVE
Protein, ur: NEGATIVE mg/dL
Specific Gravity, Urine: 1.026 (ref 1.005–1.030)
Urobilinogen, UA: 0.2 mg/dL (ref 0.0–1.0)
pH: 6.5 (ref 5.0–8.0)

## 2014-11-22 LAB — SURGICAL PCR SCREEN
MRSA, PCR: NEGATIVE
Staphylococcus aureus: NEGATIVE

## 2014-11-22 LAB — BASIC METABOLIC PANEL
Anion gap: 7 (ref 5–15)
BUN: 20 mg/dL (ref 6–20)
CO2: 29 mmol/L (ref 22–32)
Calcium: 10.4 mg/dL — ABNORMAL HIGH (ref 8.9–10.3)
Chloride: 100 mmol/L — ABNORMAL LOW (ref 101–111)
Creatinine, Ser: 0.85 mg/dL (ref 0.44–1.00)
GFR calc Af Amer: >60 mL/min (ref >60)
GFR calc non Af Amer: >60 mL/min (ref >60)
Glucose, Bld: 98 mg/dL (ref 65–99)
Potassium: 4.3 mmol/L (ref 3.5–5.1)
Sodium: 136 mmol/L (ref 135–145)

## 2014-11-22 LAB — PROTIME-INR
INR: 1.14 (ref 0.00–1.49)
Prothrombin Time: 14.8 seconds (ref 11.6–15.2)

## 2014-11-22 LAB — CBC
HCT: 38.9 % (ref 36.0–46.0)
Hemoglobin: 12.7 g/dL (ref 12.0–15.0)
MCH: 28.6 pg (ref 26.0–34.0)
MCHC: 32.6 g/dL (ref 30.0–36.0)
MCV: 87.6 fL (ref 78.0–100.0)
Platelets: 181 10*3/uL (ref 150–400)
RBC: 4.44 MIL/uL (ref 3.87–5.11)
RDW: 15.6 % — ABNORMAL HIGH (ref 11.5–15.5)
WBC: 9.4 10*3/uL (ref 4.0–10.5)

## 2014-11-22 LAB — ABO/RH: ABO/RH(D): A POS

## 2014-11-22 LAB — APTT: aPTT: 30 seconds (ref 24–37)

## 2014-11-22 NOTE — Progress Notes (Signed)
11-22-14 -Surgical clearance from DR. Conchita Paris in chart (see note on clearance) 11-04-14 - Implantable Device - Remote - EPIC Implanted Cardiac Device - physicians orders - in chart 08-27-14 - [place], DO [place]) - EPIC 07-26-14 - LOV - Dr. Krista Blue (neuro) - EPIC 07-07-14 - LOV - Dr. Tamala Julian (cardio) - EPIC 06-17-14- CT Head - EPIC 05-06-14 - EKG - EPIC  08-26-13 - 2D Echo- EPIC  01-02-13 - CXR -EPIC

## 2014-11-26 ENCOUNTER — Ambulatory Visit (INDEPENDENT_AMBULATORY_CARE_PROVIDER_SITE_OTHER): Payer: Medicare Other | Admitting: Internal Medicine

## 2014-11-26 ENCOUNTER — Encounter: Payer: Self-pay | Admitting: Internal Medicine

## 2014-11-26 VITALS — BP 100/62 | HR 67 | Temp 98.0°F | Resp 20 | Ht <= 58 in | Wt 122.4 lb

## 2014-11-26 DIAGNOSIS — G250 Essential tremor: Secondary | ICD-10-CM | POA: Diagnosis not present

## 2014-11-26 DIAGNOSIS — M25562 Pain in left knee: Secondary | ICD-10-CM

## 2014-11-26 DIAGNOSIS — I5022 Chronic systolic (congestive) heart failure: Secondary | ICD-10-CM | POA: Diagnosis not present

## 2014-11-26 DIAGNOSIS — R202 Paresthesia of skin: Secondary | ICD-10-CM

## 2014-11-26 DIAGNOSIS — I10 Essential (primary) hypertension: Secondary | ICD-10-CM

## 2014-11-26 DIAGNOSIS — Z0000 Encounter for general adult medical examination without abnormal findings: Secondary | ICD-10-CM

## 2014-11-26 DIAGNOSIS — Z95810 Presence of automatic (implantable) cardiac defibrillator: Secondary | ICD-10-CM

## 2014-11-26 DIAGNOSIS — E038 Other specified hypothyroidism: Secondary | ICD-10-CM | POA: Diagnosis not present

## 2014-11-26 DIAGNOSIS — R200 Anesthesia of skin: Secondary | ICD-10-CM

## 2014-11-26 DIAGNOSIS — Z853 Personal history of malignant neoplasm of breast: Secondary | ICD-10-CM

## 2014-11-26 DIAGNOSIS — E034 Atrophy of thyroid (acquired): Secondary | ICD-10-CM

## 2014-11-26 MED ORDER — POTASSIUM CHLORIDE ER 10 MEQ PO TBCR: 10.0000 meq | EXTENDED_RELEASE_TABLET | Freq: Every day | ORAL | Status: DC

## 2014-11-26 MED ORDER — FUROSEMIDE 40 MG PO TABS: ORAL_TABLET | ORAL | Status: DC

## 2014-11-26 NOTE — Patient Instructions (Addendum)
Encouraged her to exercise 30-45 minutes 4-5 times per week. Eat a well balanced diet. Avoid smoking. Limit alcohol intake. Wear seatbelt when riding in the car. Wear sun block (SPF >50) when spending extended times outside.  Continue current medications as ordered  Follow up in 4 mos for routine visit  Follow up with specialists as scheduled

## 2014-11-26 NOTE — Progress Notes (Signed)
Patient ID: [name], female   DOB: 28-Jul-1933, 81 y.o.   MRN: [id number] Subjective:     [name] is a 81 y.o. female and is here for a comprehensive physical exam. The patient reports problems - scheduled for left TKR on 11/29/14 by Dr Alvan Dame.  Past Medical History  Diagnosis Date  . Syncope   . Systolic CHF   . Depression   . ICD (implantable cardiac defibrillator), biventricular, in situ   . LBBB (left bundle branch block)   . Dyslipidemia   . CHF (congestive heart failure)     PACEMAKER & DEFIB  . Fainted 04/21/06    AT [place]  . Hypertension   . Arthritis   . Wears glasses   . Headache(784.0)   . GERD (gastroesophageal reflux disease)   . HLD (hyperlipidemia)   . Hypothyroidism   . Cancer     left breast cancer   . ICD (implantable cardiac defibrillator) in place     pt has pacer/icd  . Pacemaker     Guidant Device  . Complication of anesthesia     hypotensive after back surgery in 2006- reports on chart   . Nonischemic cardiomyopathy   . Normal coronary arteries     s/p cardiac cath 2007  . Memory loss   . Hearing loss   . Vertigo    Past Surgical History  Procedure Laterality Date  . Total knee arthroplasty  05/17/01    RIGHT KNEE  . Lumbar fusion  2006  . Mastectomy, partial  2008    GOT PACEMAKER AND DEFIB AT THAT TIME  . Breast surgery  2000    LUMP REMOVAL. STAGE 1 CANCER  . Joint replacement  06/14/01    right  . Pacemaker insertion  04/23/06  . Back surgery      lumbar fusion   . Cardiac catheterization    . Mass excision  11/08/2011    Procedure: EXCISION MASS;  Surgeon: [name], MD;  Location: [facility] ORS;  Service: General;  Laterality: Left;  Excision Left Thigh Mass  . Implantable cardioverter defibrillator generator change N/A 12/18/2012    Procedure: IMPLANTABLE CARDIOVERTER DEFIBRILLATOR GENERATOR CHANGE;  Surgeon: [name], MD;  Location: [facility] CATH LAB;  Service: Cardiovascular;  Laterality: N/A;  . Cataract extraction    . Eye  surgery     Family History  Problem Relation Age of Onset  . Hypertension Mother   . Hypertension Father   . Hypertension Brother   . Hypertension Brother   . Arthritis Mother     Social History   Social History  . Marital Status: Married    Spouse Name: N/A  . Number of Children: 1  . Years of Education: Masters   Occupational History  . Retired    Social History Main Topics  . Smoking status: Never Smoker   . Smokeless tobacco: Never Used  . Alcohol Use: No  . Drug Use: No  . Sexual Activity: Not Currently    Birth Control/ Protection: Post-menopausal   Other Topics Concern  . Not on file   Social History Narrative   Lives at home with husband.   Right-handed.      As of 07/28/2014   Diet: No special diet   Caffeine: yes, Chocolate, tea and sodas    Married: [place]: Yes, 2 stories, 2-3 persons live in home   Pets: No   Current/Past profession: Engineer, mining, Designer, jewellery  Exercise: Yes 2-3 x weekly   Living Will: Yes   DNR: No   POA/HPOA: No         Health Maintenance  Topic Date Due  . COLONOSCOPY  02/13/1986  . DEXA SCAN  02/13/2001  . PNA vac Low Risk Adult (2 of 2 - PPSV23) 04/09/2013  . INFLUENZA VACCINE  11/08/2014  . TETANUS/TDAP  01/08/2023  . ZOSTAVAX  Completed    Review of Systems   Review of Systems  Constitutional: Negative for fever, chills and malaise/fatigue.  HENT: Negative for sore throat and tinnitus.   Eyes: Negative for blurred vision and double vision.  Respiratory: Negative for cough, shortness of breath and wheezing.   Cardiovascular: Negative for chest pain, palpitations, orthopnea and leg swelling.  Gastrointestinal: Negative for heartburn, nausea, vomiting, abdominal pain, diarrhea, constipation and blood in stool.  Genitourinary: Negative for dysuria, urgency, frequency and hematuria.       No urinary/bowel incontinence  Musculoskeletal: Positive for back pain and joint pain. Negative for myalgias  and falls.       Left knee. No falls or near falls in the last yr  Skin: Negative for rash.  Neurological: Positive for tremors. Negative for dizziness, tingling, sensory change, focal weakness, seizures, loss of consciousness, weakness and headaches.  Endo/Heme/Allergies: Negative for environmental allergies. Does not bruise/bleed easily.  Psychiatric/Behavioral: Negative for depression and memory loss. The patient is not nervous/anxious and does not have insomnia.      Objective:      Physical Exam  Constitutional: She is oriented to person, place, and time and well-developed, well-nourished, and in no distress.  Looks well in NAD  HENT:  Head: Normocephalic and atraumatic.  Right Ear: External ear normal.  Left Ear: External ear normal.  Mouth/Throat: Oropharynx is clear and moist. No oropharyngeal exudate.  Eyes: Conjunctivae and EOM are normal. Pupils are equal, round, and reactive to light. No scleral icterus.  Neck: Normal range of motion. Neck supple. Carotid bruit is not present. No tracheal deviation present. No thyromegaly present.  Cardiovascular: Normal rate, regular rhythm, normal heart sounds and intact distal pulses.  Exam reveals no gallop and no friction rub.   No murmur heard. Pulmonary/Chest: Effort normal and breath sounds normal. She has no wheezes. She has no rhonchi. She has no rales. She exhibits no tenderness. Right breast exhibits tenderness (upper outer quadrant with palpable easily compressible cysts). Right breast exhibits no inverted nipple, no mass, no nipple discharge and no skin change. Left breast exhibits skin change (due to scar tissue). Left breast exhibits no inverted nipple, no mass, no nipple discharge and no tenderness. Breasts are asymmetrical (left smaller due to lumpectomy).    Abdominal: Soft. Bowel sounds are normal. She exhibits no distension and no mass. There is no hepatosplenomegaly. There is no tenderness. There is no rebound and no  guarding.  Genitourinary:  Deferred due to age. She has no c/o  Musculoskeletal: She exhibits edema and tenderness.  Left knee significant swelling with reduced ROM. Antalgic gait noted  Lymphadenopathy:    She has no cervical adenopathy.  Neurological: She is alert and oriented to person, place, and time. She displays tremor and abnormal reflex (in b/l knee). Gait abnormal.  Skin: Skin is warm and dry. No rash noted.  Psychiatric: Mood, memory, affect and judgment normal.   MMSE - Mini Mental State Exam 07/28/2014 07/16/2014 06/09/2014  Orientation to time 5 5 5   Orientation to Place 4 5 5   Registration 3 3  3  Attention/ Calculation 5 5 5   Recall 1 2 1   Language- name 2 objects 2 2 2   Language- repeat 1 1 1   Language- follow 3 step command 3 3 3   Language- read & follow direction 1 1 1   Write a sentence 1 1 1   Copy design 1 1 1   Total score 27 29 28     Depression screen [facility] 2/9 11/26/2014 07/28/2014  Decreased Interest 0 0  Down, Depressed, Hopeless 0 0  PHQ - 2 Score 0 0         Assessment:    Healthy female exam.       ICD-9-CM ICD-10-CM   1. Well adult exam V70.0 Z00.00   2. Chronic systolic heart failure - EF 45-50% in May 2015; on ARB/BB 428.22 I50.22 Lipid Panel  3. Left knee pain 719.46 M25.562   4. Benign hypertension 401.1 I10   5. Hypothyroidism due to acquired atrophy of thyroid 244.8 E03.8    246.8 E03.4   6. Numbness and tingling in right hand 782.0 R20.2   7. Essential tremor 333.1 G25.0   8. ICD (implantable cardioverter-defibrillator), biventricular, in situ V45.02 Z95.810   9.      Hx breast CA T1bNxMx, s/p BCT 2008, triple negative  Plan:     See After Visit Summary for Counseling Recommendations    Pt is UTD on health maintenance. Vaccinations are UTD. Pt maintains a healthy lifestyle. Encouraged pt to exercise 30-45 minutes 4-5 times per week. Eat a well balanced diet. Avoid smoking. Limit alcohol intake. Wear seatbelt when riding in the car.  Wear sun block (SPF >50) when spending extended times outside.  Continue current medications as ordered  Follow up in 4 mos for routine visit  Follow up with specialists as scheduled  Zygmund Passero S. Perlie Gold  [facility] [address] [place], [place] [address] [phone] Cell (Monday-Friday 8 AM - 5 PM) [phone] After 5 PM and follow prompts

## 2014-11-27 LAB — LIPID PANEL
Chol/HDL Ratio: 2.8 ratio units (ref 0.0–4.4)
Cholesterol, Total: 178 mg/dL (ref 100–199)
HDL: 64 mg/dL (ref >39)
LDL Calculated: 85 mg/dL (ref 0–99)
Triglycerides: 147 mg/dL (ref 0–149)
VLDL Cholesterol Cal: 29 mg/dL (ref 5–40)

## 2014-11-29 ENCOUNTER — Inpatient Hospital Stay (HOSPITAL_COMMUNITY): Payer: Medicare Other | Admitting: Anesthesiology

## 2014-11-29 ENCOUNTER — Inpatient Hospital Stay (HOSPITAL_COMMUNITY)
Admission: RE | Admit: 2014-11-29 | Discharge: 2014-12-01 | DRG: 470 | Disposition: A | Discharge status: Home Health | Payer: Medicare Other | Source: Ambulatory Visit | Attending: Orthopedic Surgery | Admitting: Orthopedic Surgery

## 2014-11-29 ENCOUNTER — Encounter (HOSPITAL_COMMUNITY): Payer: Self-pay | Admitting: *Deleted

## 2014-11-29 ENCOUNTER — Encounter (HOSPITAL_COMMUNITY)
Admission: RE | Disposition: A | Discharge status: Home Health | Payer: Self-pay | Source: Ambulatory Visit | Attending: Orthopedic Surgery

## 2014-11-29 DIAGNOSIS — M1712 Unilateral primary osteoarthritis, left knee: Principal | ICD-10-CM | POA: Diagnosis present

## 2014-11-29 DIAGNOSIS — Z96659 Presence of unspecified artificial knee joint: Secondary | ICD-10-CM

## 2014-11-29 DIAGNOSIS — M659 Synovitis and tenosynovitis, unspecified: Secondary | ICD-10-CM | POA: Diagnosis present

## 2014-11-29 DIAGNOSIS — I10 Essential (primary) hypertension: Secondary | ICD-10-CM | POA: Diagnosis present

## 2014-11-29 DIAGNOSIS — K219 Gastro-esophageal reflux disease without esophagitis: Secondary | ICD-10-CM | POA: Diagnosis present

## 2014-11-29 DIAGNOSIS — E785 Hyperlipidemia, unspecified: Secondary | ICD-10-CM | POA: Diagnosis present

## 2014-11-29 DIAGNOSIS — Z981 Arthrodesis status: Secondary | ICD-10-CM | POA: Diagnosis not present

## 2014-11-29 DIAGNOSIS — E039 Hypothyroidism, unspecified: Secondary | ICD-10-CM | POA: Diagnosis present

## 2014-11-29 DIAGNOSIS — I5022 Chronic systolic (congestive) heart failure: Secondary | ICD-10-CM | POA: Diagnosis present

## 2014-11-29 DIAGNOSIS — Z8249 Family history of ischemic heart disease and other diseases of the circulatory system: Secondary | ICD-10-CM

## 2014-11-29 DIAGNOSIS — Z01812 Encounter for preprocedural laboratory examination: Secondary | ICD-10-CM

## 2014-11-29 DIAGNOSIS — H9190 Unspecified hearing loss, unspecified ear: Secondary | ICD-10-CM | POA: Diagnosis present

## 2014-11-29 DIAGNOSIS — Z96651 Presence of right artificial knee joint: Secondary | ICD-10-CM | POA: Diagnosis present

## 2014-11-29 DIAGNOSIS — Z853 Personal history of malignant neoplasm of breast: Secondary | ICD-10-CM

## 2014-11-29 DIAGNOSIS — Z96652 Presence of left artificial knee joint: Secondary | ICD-10-CM

## 2014-11-29 DIAGNOSIS — M25562 Pain in left knee: Secondary | ICD-10-CM | POA: Diagnosis present

## 2014-11-29 DIAGNOSIS — F329 Major depressive disorder, single episode, unspecified: Secondary | ICD-10-CM | POA: Diagnosis present

## 2014-11-29 DIAGNOSIS — Z95810 Presence of automatic (implantable) cardiac defibrillator: Secondary | ICD-10-CM

## 2014-11-29 HISTORY — PX: TOTAL KNEE ARTHROPLASTY: SHX125

## 2014-11-29 LAB — TYPE AND SCREEN
ABO/RH(D): A POS
Antibody Screen: NEGATIVE

## 2014-11-29 SURGERY — ARTHROPLASTY, KNEE, TOTAL
Anesthesia: Regional | Site: Knee | Laterality: Left

## 2014-11-29 MED ORDER — DEXAMETHASONE SODIUM PHOSPHATE 10 MG/ML IJ SOLN
10.0000 mg | Freq: Once | INTRAMUSCULAR | Status: AC
  Administered 2014-11-30: 10 mg via INTRAVENOUS
  Filled 2014-11-29: qty 1

## 2014-11-29 MED ORDER — KETOROLAC TROMETHAMINE 30 MG/ML IJ SOLN
INTRAMUSCULAR | Status: AC
  Filled 2014-11-29: qty 1

## 2014-11-29 MED ORDER — ONDANSETRON HCL 4 MG PO TABS: 4.0000 mg | ORAL_TABLET | Freq: Four times a day (QID) | ORAL | Status: DC | PRN

## 2014-11-29 MED ORDER — EPHEDRINE SULFATE 50 MG/ML IJ SOLN
INTRAMUSCULAR | Status: AC
  Filled 2014-11-29: qty 1

## 2014-11-29 MED ORDER — CELECOXIB 200 MG PO CAPS
200.0000 mg | ORAL_CAPSULE | Freq: Two times a day (BID) | ORAL | Status: DC
  Administered 2014-11-29 – 2014-12-01 (×4): 200 mg via ORAL
  Filled 2014-11-29 (×8): qty 1

## 2014-11-29 MED ORDER — SODIUM CHLORIDE 0.9 % IJ SOLN
INTRAMUSCULAR | Status: AC
  Filled 2014-11-29: qty 50

## 2014-11-29 MED ORDER — BUPIVACAINE-EPINEPHRINE (PF) 0.25% -1:200000 IJ SOLN
INTRAMUSCULAR | Status: AC
  Filled 2014-11-29: qty 30

## 2014-11-29 MED ORDER — ONDANSETRON HCL 4 MG/2ML IJ SOLN
INTRAMUSCULAR | Status: AC
  Filled 2014-11-29: qty 2

## 2014-11-29 MED ORDER — FENTANYL CITRATE (PF) 100 MCG/2ML IJ SOLN
INTRAMUSCULAR | Status: AC
  Filled 2014-11-29: qty 4

## 2014-11-29 MED ORDER — SPIRONOLACTONE 25 MG PO TABS
25.0000 mg | ORAL_TABLET | Freq: Every morning | ORAL | Status: DC
  Administered 2014-11-29 – 2014-12-01 (×2): 25 mg via ORAL
  Filled 2014-11-29 (×3): qty 1

## 2014-11-29 MED ORDER — CHLORHEXIDINE GLUCONATE 4 % EX LIQD: 60.0000 mL | Freq: Once | CUTANEOUS | Status: DC

## 2014-11-29 MED ORDER — METOPROLOL TARTRATE 12.5 MG HALF TABLET
12.5000 mg | ORAL_TABLET | Freq: Every evening | ORAL | Status: DC
  Filled 2014-11-29 (×3): qty 1

## 2014-11-29 MED ORDER — CEFAZOLIN SODIUM-DEXTROSE 2-3 GM-% IV SOLR
2.0000 g | Freq: Four times a day (QID) | INTRAVENOUS | Status: AC
  Administered 2014-11-29 (×2): 2 g via INTRAVENOUS
  Filled 2014-11-29 (×2): qty 50

## 2014-11-29 MED ORDER — METOCLOPRAMIDE HCL 10 MG PO TABS: 5.0000 mg | ORAL_TABLET | Freq: Three times a day (TID) | ORAL | Status: DC | PRN

## 2014-11-29 MED ORDER — DEXAMETHASONE SODIUM PHOSPHATE 10 MG/ML IJ SOLN
10.0000 mg | Freq: Once | INTRAMUSCULAR | Status: AC
  Administered 2014-11-29: 10 mg via INTRAVENOUS

## 2014-11-29 MED ORDER — BISACODYL 10 MG RE SUPP: 10.0000 mg | Freq: Every day | RECTAL | Status: DC | PRN

## 2014-11-29 MED ORDER — FUROSEMIDE 40 MG PO TABS
40.0000 mg | ORAL_TABLET | Freq: Every day | ORAL | Status: DC
  Administered 2014-11-30 – 2014-12-01 (×2): 40 mg via ORAL
  Filled 2014-11-29 (×3): qty 1

## 2014-11-29 MED ORDER — CEFAZOLIN SODIUM-DEXTROSE 2-3 GM-% IV SOLR
INTRAVENOUS | Status: AC
  Filled 2014-11-29: qty 50

## 2014-11-29 MED ORDER — EPHEDRINE SULFATE 50 MG/ML IJ SOLN
INTRAMUSCULAR | Status: DC | PRN
  Administered 2014-11-29 (×3): 10 mg via INTRAVENOUS

## 2014-11-29 MED ORDER — KETOROLAC TROMETHAMINE 30 MG/ML IJ SOLN
INTRAMUSCULAR | Status: DC | PRN
  Administered 2014-11-29: 30 mg via INTRAVENOUS

## 2014-11-29 MED ORDER — SODIUM CHLORIDE 0.9 % IJ SOLN
INTRAMUSCULAR | Status: DC | PRN
  Administered 2014-11-29: 30 mL via INTRAVENOUS

## 2014-11-29 MED ORDER — ROCURONIUM BROMIDE 100 MG/10ML IV SOLN
INTRAVENOUS | Status: AC
  Filled 2014-11-29: qty 1

## 2014-11-29 MED ORDER — LACTATED RINGERS IV SOLN
INTRAVENOUS | Status: DC | PRN
  Administered 2014-11-29 (×2): via INTRAVENOUS

## 2014-11-29 MED ORDER — POTASSIUM CHLORIDE ER 10 MEQ PO TBCR
10.0000 meq | EXTENDED_RELEASE_TABLET | Freq: Every day | ORAL | Status: DC
  Administered 2014-11-30 – 2014-12-01 (×2): 10 meq via ORAL
  Filled 2014-11-29 (×2): qty 1

## 2014-11-29 MED ORDER — RIVAROXABAN 10 MG PO TABS
10.0000 mg | ORAL_TABLET | ORAL | Status: DC
  Administered 2014-11-30 – 2014-12-01 (×2): 10 mg via ORAL
  Filled 2014-11-29 (×3): qty 1

## 2014-11-29 MED ORDER — FENTANYL CITRATE (PF) 250 MCG/5ML IJ SOLN
INTRAMUSCULAR | Status: DC | PRN
  Administered 2014-11-29 (×2): 25 ug via INTRAVENOUS
  Administered 2014-11-29: 50 ug via INTRAVENOUS

## 2014-11-29 MED ORDER — TRANEXAMIC ACID 1000 MG/10ML IV SOLN
1000.0000 mg | Freq: Once | INTRAVENOUS | Status: AC
  Administered 2014-11-29: 1000 mg via INTRAVENOUS
  Filled 2014-11-29: qty 10

## 2014-11-29 MED ORDER — ONDANSETRON HCL 4 MG/2ML IJ SOLN: 4.0000 mg | Freq: Four times a day (QID) | INTRAMUSCULAR | Status: DC | PRN

## 2014-11-29 MED ORDER — FENTANYL CITRATE (PF) 100 MCG/2ML IJ SOLN
INTRAMUSCULAR | Status: AC
  Filled 2014-11-29: qty 2

## 2014-11-29 MED ORDER — OMEPRAZOLE 20 MG PO CPDR
40.0000 mg | DELAYED_RELEASE_CAPSULE | Freq: Every day | ORAL | Status: DC
  Administered 2014-11-30 – 2014-12-01 (×2): 40 mg via ORAL
  Filled 2014-11-29 (×3): qty 2

## 2014-11-29 MED ORDER — FENTANYL CITRATE (PF) 100 MCG/2ML IJ SOLN
25.0000 ug | INTRAMUSCULAR | Status: DC | PRN
  Administered 2014-11-29 (×2): 25 ug via INTRAVENOUS
  Administered 2014-11-29: 50 ug via INTRAVENOUS

## 2014-11-29 MED ORDER — BUPIVACAINE-EPINEPHRINE (PF) 0.5% -1:200000 IJ SOLN
INTRAMUSCULAR | Status: AC
  Filled 2014-11-29: qty 30

## 2014-11-29 MED ORDER — ACETAMINOPHEN 325 MG PO TABS
650.0000 mg | ORAL_TABLET | Freq: Four times a day (QID) | ORAL | Status: DC | PRN
  Administered 2014-11-30 – 2014-12-01 (×4): 650 mg via ORAL
  Filled 2014-11-29 (×4): qty 2

## 2014-11-29 MED ORDER — PHENOL 1.4 % MT LIQD
1.0000 | OROMUCOSAL | Status: DC | PRN
  Filled 2014-11-29: qty 177

## 2014-11-29 MED ORDER — PROPOFOL 10 MG/ML IV BOLUS
INTRAVENOUS | Status: AC
Start: 2014-11-29 — End: 2014-11-29
  Filled 2014-11-29: qty 20

## 2014-11-29 MED ORDER — POLYETHYLENE GLYCOL 3350 17 G PO PACK
17.0000 g | PACK | Freq: Two times a day (BID) | ORAL | Status: DC
Start: 2014-11-29 — End: 2014-12-01
  Administered 2014-11-29 – 2014-12-01 (×5): 17 g via ORAL

## 2014-11-29 MED ORDER — FERROUS SULFATE 325 (65 FE) MG PO TABS
325.0000 mg | ORAL_TABLET | Freq: Three times a day (TID) | ORAL | Status: DC
  Administered 2014-11-29 – 2014-12-01 (×5): 325 mg via ORAL
  Filled 2014-11-29 (×8): qty 1

## 2014-11-29 MED ORDER — SERTRALINE HCL 100 MG PO TABS
200.0000 mg | ORAL_TABLET | Freq: Every morning | ORAL | Status: DC
  Administered 2014-11-30 – 2014-12-01 (×2): 200 mg via ORAL
  Filled 2014-11-29 (×2): qty 2

## 2014-11-29 MED ORDER — LACTATED RINGERS IV SOLN: INTRAVENOUS | Status: DC

## 2014-11-29 MED ORDER — OXYCODONE HCL 5 MG PO TABS: 5.0000 mg | ORAL_TABLET | Freq: Once | ORAL | Status: DC | PRN

## 2014-11-29 MED ORDER — BUPIVACAINE-EPINEPHRINE (PF) 0.5% -1:200000 IJ SOLN
INTRAMUSCULAR | Status: DC | PRN
  Administered 2014-11-29: 20 mL via PERINEURAL

## 2014-11-29 MED ORDER — ALUM & MAG HYDROXIDE-SIMETH 200-200-20 MG/5ML PO SUSP: 30.0000 mL | ORAL | Status: DC | PRN

## 2014-11-29 MED ORDER — SODIUM CHLORIDE 0.9 % IV SOLN
INTRAVENOUS | Status: DC
  Administered 2014-11-29 – 2014-11-30 (×4): via INTRAVENOUS
  Filled 2014-11-29 (×10): qty 1000

## 2014-11-29 MED ORDER — IRBESARTAN 300 MG PO TABS
300.0000 mg | ORAL_TABLET | Freq: Every day | ORAL | Status: DC
  Administered 2014-12-01: 300 mg via ORAL
  Filled 2014-11-29 (×3): qty 1

## 2014-11-29 MED ORDER — DOCUSATE SODIUM 100 MG PO CAPS
100.0000 mg | ORAL_CAPSULE | Freq: Two times a day (BID) | ORAL | Status: DC
  Administered 2014-11-29 – 2014-12-01 (×5): 100 mg via ORAL

## 2014-11-29 MED ORDER — ONDANSETRON HCL 4 MG/2ML IJ SOLN
INTRAMUSCULAR | Status: DC | PRN
  Administered 2014-11-29: 4 mg via INTRAVENOUS

## 2014-11-29 MED ORDER — CEFAZOLIN SODIUM-DEXTROSE 2-3 GM-% IV SOLR
2.0000 g | INTRAVENOUS | Status: AC
  Administered 2014-11-29: 2 g via INTRAVENOUS

## 2014-11-29 MED ORDER — PHENYLEPHRINE HCL 10 MG/ML IJ SOLN
INTRAMUSCULAR | Status: DC | PRN
  Administered 2014-11-29 (×2): 80 ug via INTRAVENOUS

## 2014-11-29 MED ORDER — BUPROPION HCL ER (XL) 300 MG PO TB24
450.0000 mg | ORAL_TABLET | Freq: Every day | ORAL | Status: DC
  Administered 2014-11-30 – 2014-12-01 (×2): 450 mg via ORAL
  Filled 2014-11-29 (×2): qty 1

## 2014-11-29 MED ORDER — PHENYLEPHRINE 40 MCG/ML (10ML) SYRINGE FOR IV PUSH (FOR BLOOD PRESSURE SUPPORT)
PREFILLED_SYRINGE | INTRAVENOUS | Status: AC
  Filled 2014-11-29: qty 10

## 2014-11-29 MED ORDER — METHOCARBAMOL 500 MG PO TABS
500.0000 mg | ORAL_TABLET | Freq: Four times a day (QID) | ORAL | Status: DC | PRN
  Administered 2014-11-30 – 2014-12-01 (×3): 500 mg via ORAL
  Filled 2014-11-29 (×3): qty 1

## 2014-11-29 MED ORDER — TRAMADOL HCL 50 MG PO TABS
50.0000 mg | ORAL_TABLET | Freq: Four times a day (QID) | ORAL | Status: DC
  Administered 2014-11-29 – 2014-11-30 (×3): 50 mg via ORAL
  Administered 2014-11-30 (×2): 100 mg via ORAL
  Administered 2014-11-30: 50 mg via ORAL
  Administered 2014-12-01 (×3): 100 mg via ORAL
  Filled 2014-11-29 (×2): qty 1
  Filled 2014-11-29: qty 2
  Filled 2014-11-29 (×4): qty 1
  Filled 2014-11-29 (×3): qty 2

## 2014-11-29 MED ORDER — ROCURONIUM BROMIDE 100 MG/10ML IV SOLN
INTRAVENOUS | Status: DC | PRN
  Administered 2014-11-29: 30 mg via INTRAVENOUS

## 2014-11-29 MED ORDER — PROPOFOL 10 MG/ML IV BOLUS
INTRAVENOUS | Status: AC
  Filled 2014-11-29: qty 20

## 2014-11-29 MED ORDER — BUPIVACAINE LIPOSOME 1.3 % IJ SUSP: Freq: Once | INTRAMUSCULAR | Status: DC

## 2014-11-29 MED ORDER — METOCLOPRAMIDE HCL 5 MG/ML IJ SOLN: 5.0000 mg | Freq: Three times a day (TID) | INTRAMUSCULAR | Status: DC | PRN

## 2014-11-29 MED ORDER — METOPROLOL TARTRATE 25 MG PO TABS
25.0000 mg | ORAL_TABLET | Freq: Every day | ORAL | Status: DC
  Administered 2014-12-01: 25 mg via ORAL
  Filled 2014-11-29 (×2): qty 1

## 2014-11-29 MED ORDER — OXYCODONE HCL 5 MG/5ML PO SOLN
5.0000 mg | Freq: Once | ORAL | Status: DC | PRN
  Filled 2014-11-29: qty 5

## 2014-11-29 MED ORDER — SODIUM CHLORIDE 0.9 % IJ SOLN
INTRAMUSCULAR | Status: AC
  Filled 2014-11-29: qty 10

## 2014-11-29 MED ORDER — GLYCOPYRROLATE 0.2 MG/ML IJ SOLN
INTRAMUSCULAR | Status: AC
  Filled 2014-11-29: qty 3

## 2014-11-29 MED ORDER — HYDROMORPHONE HCL 1 MG/ML IJ SOLN
0.5000 mg | INTRAMUSCULAR | Status: DC | PRN
  Administered 2014-11-29: 0.5 mg via INTRAVENOUS
  Administered 2014-11-29 – 2014-11-30 (×2): 1 mg via INTRAVENOUS
  Filled 2014-11-29 (×3): qty 1

## 2014-11-29 MED ORDER — PROPOFOL 10 MG/ML IV BOLUS
INTRAVENOUS | Status: DC | PRN
  Administered 2014-11-29: 20 mg via INTRAVENOUS
  Administered 2014-11-29: 100 mg via INTRAVENOUS

## 2014-11-29 MED ORDER — BUPIVACAINE-EPINEPHRINE (PF) 0.25% -1:200000 IJ SOLN
INTRAMUSCULAR | Status: DC | PRN
  Administered 2014-11-29: 30 mL via PERINEURAL

## 2014-11-29 MED ORDER — METOPROLOL TARTRATE 12.5 MG HALF TABLET: 12.5000 mg | ORAL_TABLET | Freq: Two times a day (BID) | ORAL | Status: DC

## 2014-11-29 MED ORDER — MAGNESIUM CITRATE PO SOLN: 1.0000 | Freq: Once | ORAL | Status: DC | PRN

## 2014-11-29 MED ORDER — NEOSTIGMINE METHYLSULFATE 10 MG/10ML IV SOLN
INTRAVENOUS | Status: DC | PRN
  Administered 2014-11-29: 3 mg via INTRAVENOUS

## 2014-11-29 MED ORDER — ACETAMINOPHEN 650 MG RE SUPP: 650.0000 mg | Freq: Four times a day (QID) | RECTAL | Status: DC | PRN

## 2014-11-29 MED ORDER — METHOCARBAMOL 1000 MG/10ML IJ SOLN
500.0000 mg | Freq: Four times a day (QID) | INTRAMUSCULAR | Status: DC | PRN
  Administered 2014-11-29: 500 mg via INTRAVENOUS
  Filled 2014-11-29 (×2): qty 5

## 2014-11-29 MED ORDER — GLYCOPYRROLATE 0.2 MG/ML IJ SOLN
INTRAMUSCULAR | Status: DC | PRN
  Administered 2014-11-29: 0.4 mg via INTRAVENOUS

## 2014-11-29 MED ORDER — OXCARBAZEPINE 300 MG PO TABS
300.0000 mg | ORAL_TABLET | Freq: Every morning | ORAL | Status: DC
  Administered 2014-11-30 – 2014-12-01 (×2): 300 mg via ORAL
  Filled 2014-11-29 (×3): qty 1

## 2014-11-29 MED ORDER — DEXAMETHASONE SODIUM PHOSPHATE 10 MG/ML IJ SOLN
INTRAMUSCULAR | Status: AC
  Filled 2014-11-29: qty 1

## 2014-11-29 MED ORDER — FUROSEMIDE 20 MG PO TABS
20.0000 mg | ORAL_TABLET | Freq: Every evening | ORAL | Status: DC
  Administered 2014-11-29: 20 mg via ORAL
  Filled 2014-11-29 (×3): qty 1

## 2014-11-29 MED ORDER — LEVOTHYROXINE SODIUM 100 MCG PO TABS
100.0000 ug | ORAL_TABLET | Freq: Every day | ORAL | Status: DC
Start: 2014-11-30 — End: 2014-12-01
  Administered 2014-11-30 – 2014-12-01 (×2): 100 ug via ORAL
  Filled 2014-11-29 (×3): qty 1

## 2014-11-29 MED ORDER — DIPHENHYDRAMINE HCL 25 MG PO CAPS: 25.0000 mg | ORAL_CAPSULE | Freq: Four times a day (QID) | ORAL | Status: DC | PRN

## 2014-11-29 MED ORDER — MENTHOL 3 MG MT LOZG
1.0000 | LOZENGE | OROMUCOSAL | Status: DC | PRN
Start: 2014-11-29 — End: 2014-12-01
  Administered 2014-11-29: 3 mg via ORAL
  Filled 2014-11-29 (×2): qty 9

## 2014-11-29 SURGICAL SUPPLY — 53 items
BAG DECANTER FOR FLEXI CONT (MISCELLANEOUS) IMPLANT
BAG ZIPLOCK 12X15 (MISCELLANEOUS) IMPLANT
BANDAGE ELASTIC 6 VELCRO ST LF (GAUZE/BANDAGES/DRESSINGS) ×2 IMPLANT
BANDAGE ESMARK 6X9 LF (GAUZE/BANDAGES/DRESSINGS) ×1 IMPLANT
BLADE SAW SGTL 13.0X1.19X90.0M (BLADE) ×2 IMPLANT
BNDG ESMARK 6X9 LF (GAUZE/BANDAGES/DRESSINGS) ×2
BOWL SMART MIX CTS (DISPOSABLE) ×2 IMPLANT
CAP KNEE TOTAL 3 SIGMA ×2 IMPLANT
CEMENT HV SMART SET (Cement) ×4 IMPLANT
CUFF TOURN SGL QUICK 34 (TOURNIQUET CUFF) ×1
CUFF TRNQT CYL 34X4X40X1 (TOURNIQUET CUFF) ×1 IMPLANT
DECANTER SPIKE VIAL GLASS SM (MISCELLANEOUS) ×2 IMPLANT
DRAPE EXTREMITY T 121X128X90 (DRAPE) ×2 IMPLANT
DRAPE POUCH INSTRU U-SHP 10X18 (DRAPES) ×2 IMPLANT
DRAPE U-SHAPE 47X51 STRL (DRAPES) ×2 IMPLANT
DRSG AQUACEL AG ADV 3.5X10 (GAUZE/BANDAGES/DRESSINGS) ×2 IMPLANT
DURAPREP 26ML APPLICATOR (WOUND CARE) ×4 IMPLANT
ELECT REM PT RETURN 9FT ADLT (ELECTROSURGICAL) ×2
ELECTRODE REM PT RTRN 9FT ADLT (ELECTROSURGICAL) ×1 IMPLANT
FACESHIELD WRAPAROUND (MASK) ×10 IMPLANT
GLOVE BIOGEL PI IND STRL 7.5 (GLOVE) ×1 IMPLANT
GLOVE BIOGEL PI IND STRL 8.5 (GLOVE) ×1 IMPLANT
GLOVE BIOGEL PI INDICATOR 7.5 (GLOVE) ×1
GLOVE BIOGEL PI INDICATOR 8.5 (GLOVE) ×1
GLOVE ECLIPSE 8.0 STRL XLNG CF (GLOVE) ×2 IMPLANT
GLOVE ORTHO TXT STRL SZ7.5 (GLOVE) ×4 IMPLANT
GOWN SPEC L3 XXLG W/TWL (GOWN DISPOSABLE) ×2 IMPLANT
GOWN STRL REUS W/TWL LRG LVL3 (GOWN DISPOSABLE) ×2 IMPLANT
HANDPIECE INTERPULSE COAX TIP (DISPOSABLE) ×1
KIT BASIN OR (CUSTOM PROCEDURE TRAY) ×2 IMPLANT
LIQUID BAND (GAUZE/BANDAGES/DRESSINGS) ×2 IMPLANT
MANIFOLD NEPTUNE II (INSTRUMENTS) ×2 IMPLANT
NDL SAFETY ECLIPSE 18X1.5 (NEEDLE) ×1 IMPLANT
NEEDLE HYPO 18GX1.5 SHARP (NEEDLE) ×1
PACK TOTAL JOINT (CUSTOM PROCEDURE TRAY) ×2 IMPLANT
PEN SKIN MARKING BROAD (MISCELLANEOUS) ×2 IMPLANT
POSITIONER SURGICAL ARM (MISCELLANEOUS) ×2 IMPLANT
SET HNDPC FAN SPRY TIP SCT (DISPOSABLE) ×1 IMPLANT
SET PAD KNEE POSITIONER (MISCELLANEOUS) ×2 IMPLANT
SUCTION FRAZIER 12FR DISP (SUCTIONS) ×2 IMPLANT
SUT MNCRL AB 4-0 PS2 18 (SUTURE) ×2 IMPLANT
SUT VIC AB 1 CT1 36 (SUTURE) ×2 IMPLANT
SUT VIC AB 2-0 CT1 27 (SUTURE) ×3
SUT VIC AB 2-0 CT1 TAPERPNT 27 (SUTURE) ×3 IMPLANT
SUT VLOC 180 0 24IN GS25 (SUTURE) ×2 IMPLANT
SYR 50ML LL SCALE MARK (SYRINGE) ×2 IMPLANT
TOWEL OR 17X26 10 PK STRL BLUE (TOWEL DISPOSABLE) ×2 IMPLANT
TOWEL OR NON WOVEN STRL DISP B (DISPOSABLE) IMPLANT
TRAY FOLEY W/METER SILVER 14FR (SET/KITS/TRAYS/PACK) ×2 IMPLANT
TRAY FOLEY W/METER SILVER 16FR (SET/KITS/TRAYS/PACK) ×2 IMPLANT
WATER STERILE IRR 1500ML POUR (IV SOLUTION) ×2 IMPLANT
WRAP KNEE MAXI GEL POST OP (GAUZE/BANDAGES/DRESSINGS) ×2 IMPLANT
YANKAUER SUCT BULB TIP 10FT TU (MISCELLANEOUS) ×2 IMPLANT

## 2014-11-29 NOTE — Evaluation (Signed)
Physical Therapy Evaluation Patient Details Name: [name] MRN: [id number] DOB: 1933-05-17 Today's Date: 11/29/2014   History of Present Illness  s/p  L TKA  Clinical Impression  Pt admitted with above diagnosis. Pt currently with functional limitations due to the deficits listed below (see PT Problem List).  Pt will benefit from skilled PT to increase their independence and safety with mobility to allow discharge to the venue listed below.  Expect pt should progress well; gait distance limited by pain today     Follow Up Recommendations Home health PT    Equipment Recommendations  None recommended by PT    Recommendations for Other Services       Precautions / Restrictions Precautions Precautions: Knee Restrictions Other Position/Activity Restrictions: WBAT      Mobility  Bed Mobility Overal bed mobility: Needs Assistance Bed Mobility: Supine to Sit     Supine to sit: Min guard     General bed mobility comments: incr time, assist for LLE  Transfers Overall transfer level: Needs assistance Equipment used: Rolling walker (2 wheeled) Transfers: Sit to/from Stand Sit to Stand: Min assist         General transfer comment: cues for hand placement and safety  Ambulation/Gait Ambulation/Gait assistance: Min assist Ambulation Distance (Feet): 10 Feet Assistive device: Rolling walker (2 wheeled) Gait Pattern/deviations: Step-to pattern;Antalgic     General Gait Details: cues for sequence, RW position  Stairs            Wheelchair Mobility    Modified Rankin (Stroke Patients Only)       Balance Overall balance assessment: No apparent balance deficits (not formally assessed)                                           Pertinent Vitals/Pain Pain Assessment: 0-10 Pain Score: 2  Pain Location: L knee Pain Descriptors / Indicators: Aching;Discomfort Pain Intervention(s): Limited activity within patient's  tolerance;Repositioned;Premedicated before session;Monitored during session;Ice applied    Home Living Family/patient expects to be discharged to:: Private residence Living Arrangements: Spouse/significant other   Type of Home: House Home Access: Stairs to enter Entrance Stairs-Rails: Psychiatric nurse of Steps: 5 Home Layout: Two level Home Equipment: Bedside commode;Walker - 2 wheels      Prior Function Level of Independence: Independent               Hand Dominance        Extremity/Trunk Assessment   Upper Extremity Assessment: Defer to OT evaluation           Lower Extremity Assessment: LLE deficits/detail   LLE Deficits / Details: knee extension and hip flexion 3/5; ankle WFL; knee AAROM ~15-55*     Communication   Communication: No difficulties  Cognition Arousal/Alertness: Awake/alert Behavior During Therapy: WFL for tasks assessed/performed Overall Cognitive Status: Within Functional Limits for tasks assessed                      General Comments      Exercises        Assessment/Plan    PT Assessment Patient needs continued PT services  PT Diagnosis Difficulty walking   PT Problem List Decreased strength;Decreased range of motion;Decreased activity tolerance;Decreased mobility  PT Treatment Interventions DME instruction;Gait training;Functional mobility training;Therapeutic activities;Patient/family education;Stair training;Therapeutic exercise   PT Goals (Current goals can be found in the Care  Plan section) Acute Rehab PT Goals Patient Stated Goal: return to I lifestyle PT Goal Formulation: With patient Time For Goal Achievement: 12/06/14 Potential to Achieve Goals: Good    Frequency 7X/week   Barriers to discharge        Co-evaluation               End of Session Equipment Utilized During Treatment: Gait belt Activity Tolerance: Patient tolerated treatment well Patient left: with call bell/phone  within reach;in chair Nurse Communication: Mobility status         Time: 3428-7681 PT Time Calculation (min) (ACUTE ONLY): 18 min   Charges:   PT Evaluation $Initial PT Evaluation Tier I: 1 Procedure     PT G CodesKenyon Ana Dec 17, 2014, 4:29 PM

## 2014-11-29 NOTE — Progress Notes (Signed)
Utilization review completed.  

## 2014-11-29 NOTE — Op Note (Signed)
NAME:  [name]                      MEDICAL RECORD NO.:  [id number]                             FACILITY:  [facility]      PHYSICIAN:  Pietro Cassis. Alvan Dame, M.D.  DATE OF BIRTH:  07-Oct-1933      DATE OF PROCEDURE:  11/29/2014                                     OPERATIVE REPORT         PREOPERATIVE DIAGNOSIS:  Left knee osteoarthritis.      POSTOPERATIVE DIAGNOSIS:  Left knee osteoarthritis.  History of right total knee replacement     FINDINGS:  The patient was noted to have complete loss of cartilage and   bone-on-bone arthritis with associated osteophytes in all three compartments of   the knee with a significant synovitis and associated effusion.      PROCEDURE:  Left total knee replacement.      COMPONENTS USED:  [employer] rotating platform posterior stabilized knee   system, a size 2 femur, 2 tibia, 10 mm PS insert, and 38 patellar   button.      SURGEON:  Pietro Cassis. Alvan Dame, M.D.      ASSISTANT:  Danae Orleans, PA-C.      ANESTHESIA:  General and Regional.      SPECIMENS:  None.      COMPLICATION:  None.      DRAINS:  None.  EBL: <50cc      TOURNIQUET TIME:   Total Tourniquet Time Documented: Calf (Left) - 35 minutes Total: Calf (Left) - 35 minutes  .      The patient was stable to the recovery room.      INDICATION FOR PROCEDURE:  MOSELLA Hurley is a 81 y.o. female patient of   mine.  The patient had been seen, evaluated, and treated conservatively in the   office with medication, activity modification, and injections.  The patient had   radiographic changes of bone-on-bone arthritis with endplate sclerosis and osteophytes noted.      The patient failed conservative measures including medication, injections, and activity modification, and at this point was ready for more definitive measures.   Based on the radiographic changes and failed conservative measures, the patient   decided to proceed with total knee replacement.  Risks of infection,   DVT,  component failure, need for revision surgery, postop course, and   expectations were all   discussed and reviewed.  Consent was obtained for benefit of pain   relief.      PROCEDURE IN DETAIL:  The patient was brought to the operative theater.   Once adequate anesthesia, preoperative antibiotics, 2 gm of Ancef, 1 gm of Tranexamic Acid, and 10mg  of Decadron administered, the patient was positioned supine with the left thigh tourniquet placed.  The  left lower extremity was prepped and draped in sterile fashion.  A time-   out was performed identifying the patient, planned procedure, and   extremity.      The left lower extremity was placed in the [facility] leg holder.  The leg was   exsanguinated, tourniquet elevated to 250 mmHg.  A midline incision was  made followed by median parapatellar arthrotomy.  Following initial   exposure, attention was first directed to the patella.  Precut   measurement was noted to be 22 mm.  I resected down to 14 mm and used a   38 patellar button to restore patellar height as well as cover the cut   surface.      The lug holes were drilled and a metal shim was placed to protect the   patella from retractors and saw blades.      At this point, attention was now directed to the femur.  The femoral   canal was opened with a drill, irrigated to try to prevent fat emboli.  An   intramedullary rod was passed at 3 degrees valgus, 10 mm of bone was   resected off the distal femur.  Following this resection, the tibia was   subluxated anteriorly.  Using the extramedullary guide, 6 mm of bone was resected off   the proximal lateral tibia.  We confirmed the gap would be   stable medially and laterally with a 10 mm insert as well as confirmed   the cut was perpendicular in the coronal plane, checking with an alignment rod.      Once this was done, I sized the femur to be a size 2 in the anterior-   posterior dimension, chose a standard component based on medial and    lateral dimension.  The size 2 rotation block was then pinned in   position anterior referenced using the C-clamp to set rotation.  The   anterior, posterior, and  chamfer cuts were made without difficulty nor   notching making certain that I was along the anterior cortex to help   with flexion gap stability.      The final box cut was made off the lateral aspect of distal femur.      At this point, the tibia was sized to be a size 2, the size 2 tray was   then pinned in position through the medial third of the tubercle,   drilled, and keel punched.  Trial reduction was now carried with a 2 femur,  2 tibia, a size 10 mm insert, and the 38 patella botton.  The knee was brought to   extension, full extension with good flexion stability with the patella   tracking through the trochlea without application of pressure.  Given   all these findings, the trial components removed.  Final components were   opened and cement was mixed.  The knee was irrigated with normal saline   solution and pulse lavage.  The synovial lining was   then injected with 30cc of 0.25% Marcaine with epinephrine and 1 cc of Toradol plus 30cc of NS for a total of   total of 61 cc.      The knee was irrigated.  Final implants were then cemented onto clean and   dried cut surfaces of bone with the knee brought to extension with a size 10 mm trial insert.      Once the cement had fully cured, the excess cement was removed   throughout the knee.  I confirmed I was satisfied with the range of   motion and stability, and the final size 10 mm PS insert was chosen.  It was   placed into the knee.      The tourniquet had been let down at 35 minutes.  No significant   hemostasis required.  The  extensor mechanism was then reapproximated using #1 Vicryl and # 0 V-lock sutures with the knee   in flexion.  The   remaining wound was closed with 2-0 Vicryl and running 4-0 Monocryl.   The knee was cleaned, dried, dressed sterilely  using Dermabond and   Aquacel dressing.  The patient was then   brought to recovery room in stable condition, tolerating the procedure   well.   Please note that Physician Assistant, Danae Orleans, PA-C, was present for the entirety of the case, and was utilized for pre-operative positioning, peri-operative retractor management, general facilitation of the procedure.  He was also utilized for primary wound closure at the end of the case.              Pietro Cassis Alvan Dame, M.D.    11/29/2014 9:05 AM

## 2014-11-29 NOTE — Anesthesia Preprocedure Evaluation (Addendum)
Anesthesia Evaluation  Patient identified by MRN, date of birth, ID band Patient awake    Reviewed: Allergy & Precautions, NPO status , Patient's Chart, lab work & pertinent test results  Airway Mallampati: II   Neck ROM: full    Dental   Pulmonary neg pulmonary ROS,  breath sounds clear to auscultation        Cardiovascular hypertension, +CHF + dysrhythmias + pacemaker + Cardiac Defibrillator Rhythm:regular Rate:Normal     Neuro/Psych  Headaches, Depression    GI/Hepatic GERD-  ,  Endo/Other  Hypothyroidism   Renal/GU      Musculoskeletal  (+) Arthritis -,   Abdominal   Peds  Hematology   Anesthesia Other Findings   Reproductive/Obstetrics                            Anesthesia Physical Anesthesia Plan  ASA: III  Anesthesia Plan: General and Regional   Post-op Pain Management: MAC Combined w/ Regional for Post-op pain   Induction: Intravenous  Airway Management Planned: Oral ETT  Additional Equipment:   Intra-op Plan:   Post-operative Plan: Extubation in OR  Informed Consent: I have reviewed the patients History and Physical, chart, labs and discussed the procedure including the risks, benefits and alternatives for the proposed anesthesia with the patient or authorized representative who has indicated his/her understanding and acceptance.     Plan Discussed with: CRNA, Anesthesiologist and Surgeon  Anesthesia Plan Comments: (Pt declined option of spinal anesthetic due to back problems.)       Anesthesia Quick Evaluation

## 2014-11-29 NOTE — Transfer of Care (Signed)
Immediate Anesthesia Transfer of Care Note  Patient: [name]  Procedure(s) Performed: Procedure(s) with comments: TOTAL LEFT KNEE ARTHROPLASTY (Left) - Left femoral nerve block  Patient Location: PACU  Anesthesia Type:General  Level of Consciousness: awake, alert  and patient cooperative  Airway & Oxygen Therapy: Patient Spontanous Breathing and Patient connected to face mask oxygen  Post-op Assessment: Report given to RN and Post -op Vital signs reviewed and stable  Post vital signs: Reviewed and stable  Last Vitals:  Filed Vitals:   11/29/14 0539  BP: 127/65  Pulse: 66  Temp: 36.7 C  Resp: 16    Complications: No apparent anesthesia complications

## 2014-11-29 NOTE — Anesthesia Procedure Notes (Addendum)
Anesthesia Regional Block:  Adductor canal block  Pre-Anesthetic Checklist: ,, timeout performed, Correct Patient, Correct Site, Correct Laterality, Correct Procedure, Correct Position, site marked, Risks and benefits discussed,  Surgical consent,  Pre-op evaluation,  At surgeon's request and post-op pain management  Laterality: Left  Prep: chloraprep       Needles:  Injection technique: Single-shot  Needle Type: Echogenic Needle     Needle Length: 9cm 9 cm Needle Gauge: 21 and 21 G    Additional Needles:  Procedures: ultrasound guided (picture in chart) Adductor canal block Narrative:  Start time: 11/29/2014 7:10 AM End time: 11/29/2014 7:20 AM Injection made incrementally with aspirations every 5 mL.  Performed by: Personally  Anesthesiologist: HODIERNE, ADAM  Additional Notes: Pt tolerated the procedure well.   Procedure Name: Intubation Date/Time: 11/29/2014 7:38 AM Performed by: Dione Booze Pre-anesthesia Checklist: Patient identified, Emergency Drugs available, Suction available and Patient being monitored Patient Re-evaluated:Patient Re-evaluated prior to inductionOxygen Delivery Method: Circle system utilized Preoxygenation: Pre-oxygenation with 100% oxygen Intubation Type: IV induction Ventilation: Mask ventilation without difficulty Grade View: Grade II Tube type: Oral Tube size: 7.5 mm Number of attempts: 1 Airway Equipment and Method: Stylet Secured at: 21 cm Tube secured with: Tape Dental Injury: Teeth and Oropharynx as per pre-operative assessment  Comments: Overbite

## 2014-11-29 NOTE — Progress Notes (Addendum)
AssistedDr. Marcie Bal with left, adductor canal block. Side rails up, monitors on throughout procedure. See vital signs in flow sheet. Tolerated Procedure well.

## 2014-11-29 NOTE — Interval H&P Note (Signed)
History and Physical Interval Note:  11/29/2014 7:23 AM  [name]  has presented today for surgery, with the diagnosis of LEFT KNEE OA   The various methods of treatment have been discussed with the patient and family. After consideration of risks, benefits and other options for treatment, the patient has consented to  Procedure(s): TOTAL LEFT KNEE ARTHROPLASTY (Left) as a surgical intervention .  The patient's history has been reviewed, patient examined, no change in status, stable for surgery.  I have reviewed the patient's chart and labs.  Questions were answered to the patient's satisfaction.     Mauri Pole

## 2014-11-29 NOTE — Anesthesia Postprocedure Evaluation (Signed)
Anesthesia Post Note  Patient: [name]  Procedure(s) Performed: Procedure(s) (LRB): TOTAL LEFT KNEE ARTHROPLASTY (Left)  Anesthesia type: General  Patient location: PACU  Post pain: Pain level controlled and Adequate analgesia  Post assessment: Post-op Vital signs reviewed, Patient's Cardiovascular Status Stable, Respiratory Function Stable, Patent Airway and Pain level controlled  Last Vitals:  Filed Vitals:   11/29/14 1044  BP: 118/62  Pulse: 64  Temp: 36.5 C  Resp:     Post vital signs: Reviewed and stable  Level of consciousness: awake, alert  and oriented  Complications: No apparent anesthesia complications

## 2014-11-30 ENCOUNTER — Encounter (HOSPITAL_COMMUNITY): Payer: Self-pay | Admitting: Orthopedic Surgery

## 2014-11-30 LAB — CBC
HCT: 28 % — ABNORMAL LOW (ref 36.0–46.0)
Hemoglobin: 9.2 g/dL — ABNORMAL LOW (ref 12.0–15.0)
MCH: 28.8 pg (ref 26.0–34.0)
MCHC: 32.9 g/dL (ref 30.0–36.0)
MCV: 87.5 fL (ref 78.0–100.0)
Platelets: 113 10*3/uL — ABNORMAL LOW (ref 150–400)
RBC: 3.2 MIL/uL — ABNORMAL LOW (ref 3.87–5.11)
RDW: 15.5 % (ref 11.5–15.5)
WBC: 10.9 10*3/uL — ABNORMAL HIGH (ref 4.0–10.5)

## 2014-11-30 LAB — BASIC METABOLIC PANEL
Anion gap: 4 — ABNORMAL LOW (ref 5–15)
BUN: 13 mg/dL (ref 6–20)
CO2: 29 mmol/L (ref 22–32)
Calcium: 9.1 mg/dL (ref 8.9–10.3)
Chloride: 102 mmol/L (ref 101–111)
Creatinine, Ser: 0.67 mg/dL (ref 0.44–1.00)
GFR calc Af Amer: >60 mL/min (ref >60)
GFR calc non Af Amer: >60 mL/min (ref >60)
Glucose, Bld: 102 mg/dL — ABNORMAL HIGH (ref 65–99)
Potassium: 3.7 mmol/L (ref 3.5–5.1)
Sodium: 135 mmol/L (ref 135–145)

## 2014-11-30 LAB — GLUCOSE, CAPILLARY: Glucose-Capillary: 132 mg/dL — ABNORMAL HIGH (ref 65–99)

## 2014-11-30 MED ORDER — SODIUM CHLORIDE 0.9 % IV BOLUS (SEPSIS)
250.0000 mL | Freq: Once | INTRAVENOUS | Status: AC
  Administered 2014-11-30: 250 mL via INTRAVENOUS

## 2014-11-30 NOTE — Progress Notes (Signed)
Physical Therapy Treatment Patient Details Name: [name] MRN: [id number] DOB: 1933/10/02 Today's Date: 12/24/14    History of Present Illness s/p  L TKA    PT Comments    Did well with HEP, OOB deferred d/t decr BP; will see again later for amb/stairs  Follow Up Recommendations  Home health PT     Equipment Recommendations  None recommended by PT    Recommendations for Other Services       Precautions / Restrictions Precautions Precautions: Knee Restrictions Other Position/Activity Restrictions: WBAT    Mobility  Bed Mobility                  Transfers                    Ambulation/Gait                 Stairs            Wheelchair Mobility    Modified Rankin (Stroke Patients Only)       Balance                                    Cognition Arousal/Alertness: Awake/alert Behavior During Therapy: WFL for tasks assessed/performed Overall Cognitive Status: Within Functional Limits for tasks assessed                      Exercises Total Joint Exercises Ankle Circles/Pumps: AROM;Both;10 reps Quad Sets: AROM;Strengthening;Both;10 reps Short Arc Quad: Strengthening;AROM;Left;10 reps Heel Slides: AROM;AAROM;Left;10 reps Hip ABduction/ADduction: AROM;Strengthening;Left;15 reps Straight Leg Raises: AROM;Strengthening;Left;10 reps Goniometric ROM: ~0 to 65*    General Comments        Pertinent Vitals/Pain Pain Assessment: 0-10 Pain Score: 0-No pain    Home Living                      Prior Function            PT Goals (current goals can now be found in the care plan section) Acute Rehab PT Goals Patient Stated Goal: return to I lifestyle PT Goal Formulation: With patient Time For Goal Achievement: 12/06/14 Potential to Achieve Goals: Good Progress towards PT goals: Progressing toward goals    Frequency  7X/week    PT Plan Current plan remains appropriate     Co-evaluation             End of Session   Activity Tolerance: Patient tolerated treatment well;Treatment limited secondary to medical complications (Comment) (OOB deferred, BP 99/48, to get bolus) Patient left: in bed;with call bell/phone within reach     Time: 0955-1007 PT Time Calculation (min) (ACUTE ONLY): 12 min  Charges:  $Therapeutic Exercise: 8-22 mins                    G Codes:      [name] 2014/12/24, 10:05 AM

## 2014-11-30 NOTE — Discharge Instructions (Addendum)
Information on my medicine - XARELTO® (Rivaroxaban) ° °This medication education was reviewed with me or my healthcare representative as part of my discharge preparation.  The pharmacist that spoke with me during my hospital stay was:  Gadhia, Jigna M, RPH ° °Why was Xarelto® prescribed for you? °Xarelto® was prescribed for you to reduce the risk of blood clots forming after orthopedic surgery. The medical term for these abnormal blood clots is venous thromboembolism (VTE). ° °What do you need to know about xarelto® ? °Take your Xarelto® ONCE DAILY at the same time every day. °You may take it either with or without food. ° °If you have difficulty swallowing the tablet whole, you may crush it and mix in applesauce just prior to taking your dose. ° °Take Xarelto® exactly as prescribed by your doctor and DO NOT stop taking Xarelto® without talking to the doctor who prescribed the medication.  Stopping without other VTE prevention medication to take the place of Xarelto® may increase your risk of developing a clot. ° °After discharge, you should have regular check-up appointments with your healthcare provider that is prescribing your Xarelto®.   ° °What do you do if you miss a dose? °If you miss a dose, take it as soon as you remember on the same day then continue your regularly scheduled once daily regimen the next day. Do not take two doses of Xarelto® on the same day.  ° °Important Safety Information °A possible side effect of Xarelto® is bleeding. You should call your healthcare provider right away if you experience any of the following: °? Bleeding from an injury or your nose that does not stop. °? Unusual colored urine (red or dark brown) or unusual colored stools (red or black). °? Unusual bruising for unknown reasons. °? A serious fall or if you hit your head (even if there is no bleeding). ° °Some medicines may interact with Xarelto® and might increase your risk of bleeding while on Xarelto®. To help avoid  this, consult your healthcare provider or pharmacist prior to using any new prescription or non-prescription medications, including herbals, vitamins, non-steroidal anti-inflammatory drugs (NSAIDs) and supplements. ° °This website has more information on Xarelto®: www.xarelto.com. ° °_______________________________________________________________________________________________________________________________ ° ° ° °INSTRUCTIONS AFTER JOINT REPLACEMENT  ° °o Remove items at home which could result in a fall. This includes throw rugs or furniture in walking pathways °o ICE to the affected joint every three hours while awake for 30 minutes at a time, for at least the first 3-5 days, and then as needed for pain and swelling.  Continue to use ice for pain and swelling. You may notice swelling that will progress down to the foot and ankle.  This is normal after surgery.  Elevate your leg when you are not up walking on it.   °o Continue to use the breathing machine you got in the hospital (incentive spirometer) which will help keep your temperature down.  It is common for your temperature to cycle up and down following surgery, especially at night when you are not up moving around and exerting yourself.  The breathing machine keeps your lungs expanded and your temperature down. ° ° °DIET:  As you were doing prior to hospitalization, we recommend a well-balanced diet. ° °DRESSING / WOUND CARE / SHOWERING ° °Keep the surgical dressing until follow up.  The dressing is water proof, so you can shower without any extra covering.  IF THE DRESSING FALLS OFF or the wound gets wet inside, change the dressing   with sterile gauze.  Please use good hand washing techniques before changing the dressing.  Do not use any lotions or creams on the incision until instructed by your surgeon.   ° °ACTIVITY ° °o Increase activity slowly as tolerated, but follow the weight bearing instructions below.   °o No driving for 6 weeks or until further  direction given by your physician.  You cannot drive while taking narcotics.  °o No lifting or carrying greater than 10 lbs. until further directed by your surgeon. °o Avoid periods of inactivity such as sitting longer than an hour when not asleep. This helps prevent blood clots.  °o You may return to work once you are authorized by your doctor.  ° ° ° °WEIGHT BEARING  ° °Weight bearing as tolerated with assist device (walker, cane, etc) as directed, use it as long as suggested by your surgeon or therapist, typically at least 4-6 weeks. ° ° °EXERCISES ° °Results after joint replacement surgery are often greatly improved when you follow the exercise, range of motion and muscle strengthening exercises prescribed by your doctor. Safety measures are also important to protect the joint from further injury. Any time any of these exercises cause you to have increased pain or swelling, decrease what you are doing until you are comfortable again and then slowly increase them. If you have problems or questions, call your caregiver or physical therapist for advice.  ° °Rehabilitation is important following a joint replacement. After just a few days of immobilization, the muscles of the leg can become weakened and shrink (atrophy).  These exercises are designed to build up the tone and strength of the thigh and leg muscles and to improve motion. Often times heat used for twenty to thirty minutes before working out will loosen up your tissues and help with improving the range of motion but do not use heat for the first two weeks following surgery (sometimes heat can increase post-operative swelling).  ° °These exercises can be done on a training (exercise) mat, on the floor, on a table or on a bed. Use whatever works the best and is most comfortable for you.    Use music or television while you are exercising so that the exercises are a pleasant break in your day. This will make your life better with the exercises acting as a  break in your routine that you can look forward to.   Perform all exercises about fifteen times, three times per day or as directed.  You should exercise both the operative leg and the other leg as well. ° °Exercises include: °  °• Quad Sets - Tighten up the muscle on the front of the thigh (Quad) and hold for 5-10 seconds.   °• Straight Leg Raises - With your knee straight (if you were given a brace, keep it on), lift the leg to 60 degrees, hold for 3 seconds, and slowly lower the leg.  Perform this exercise against resistance later as your leg gets stronger.  °• Leg Slides: Lying on your back, slowly slide your foot toward your buttocks, bending your knee up off the floor (only go as far as is comfortable). Then slowly slide your foot back down until your leg is flat on the floor again.  °• Angel Wings: Lying on your back spread your legs to the side as far apart as you can without causing discomfort.  °• Hamstring Strength:  Lying on your back, push your heel against the floor with your leg straight   by tightening up the muscles of your buttocks.  Repeat, but this time bend your knee to a comfortable angle, and push your heel against the floor.  You may put a pillow under the heel to make it more comfortable if necessary.  ° °A rehabilitation program following joint replacement surgery can speed recovery and prevent re-injury in the future due to weakened muscles. Contact your doctor or a physical therapist for more information on knee rehabilitation.  ° ° °CONSTIPATION ° °Constipation is defined medically as fewer than three stools per week and severe constipation as less than one stool per week.  Even if you have a regular bowel pattern at home, your normal regimen is likely to be disrupted due to multiple reasons following surgery.  Combination of anesthesia, postoperative narcotics, change in appetite and fluid intake all can affect your bowels.  ° °YOU MUST use at least one of the following options; they are  listed in order of increasing strength to get the job done.  They are all available over the counter, and you may need to use some, POSSIBLY even all of these options:   ° °Drink plenty of fluids (prune juice may be helpful) and high fiber foods °Colace 100 mg by mouth twice a day  °Senokot for constipation as directed and as needed Dulcolax (bisacodyl), take with full glass of water  °Miralax (polyethylene glycol) once or twice a day as needed. ° °If you have tried all these things and are unable to have a bowel movement in the first 3-4 days after surgery call either your surgeon or your primary doctor.   ° °If you experience loose stools or diarrhea, hold the medications until you stool forms back up.  If your symptoms do not get better within 1 week or if they get worse, check with your doctor.  If you experience "the worst abdominal pain ever" or develop nausea or vomiting, please contact the office immediately for further recommendations for treatment. ° ° °ITCHING:  If you experience itching with your medications, try taking only a single pain pill, or even half a pain pill at a time.  You can also use Benadryl over the counter for itching or also to help with sleep.  ° °TED HOSE STOCKINGS:  Use stockings on both legs until for at least 2 weeks or as directed by physician office. They may be removed at night for sleeping. ° °MEDICATIONS:  See your medication summary on the “After Visit Summary” that nursing will review with you.  You may have some home medications which will be placed on hold until you complete the course of blood thinner medication.  It is important for you to complete the blood thinner medication as prescribed. ° °PRECAUTIONS:  If you experience chest pain or shortness of breath - call 911 immediately for transfer to the hospital emergency department.  ° °If you develop a fever greater that 101 F, purulent drainage from wound, increased redness or drainage from wound, foul odor from the  wound/dressing, or calf pain - CONTACT YOUR SURGEON.   °                                                °FOLLOW-UP APPOINTMENTS:  If you do not already have a post-op appointment, please call the office for an appointment to be seen by your surgeon.    Guidelines for how soon to be seen are listed in your “After Visit Summary”, but are typically between 1-4 weeks after surgery. ° °OTHER INSTRUCTIONS:  ° °Knee Replacement:  Do not place pillow under knee, focus on keeping the knee straight while resting.  ° °MAKE SURE YOU:  °• Understand these instructions.  °• Get help right away if you are not doing well or get worse.  ° ° °Thank you for letting us be a part of your medical care team.  It is a privilege we respect greatly.  We hope these instructions will help you stay on track for a fast and full recovery!  ° ° ° °

## 2014-11-30 NOTE — Progress Notes (Signed)
OT Cancellation Note  Patient Details Name: [name] MRN: [id number] DOB: 1933-08-21   Cancelled Treatment:    Reason Eval/Treat Not Completed: Other (comment) Checked on pt earlier this am and per nursing, pt getting ready to receive bolus due to low BP. Will try back later time.   [place], [place] 11/30/2014, 12:15 PM

## 2014-11-30 NOTE — Progress Notes (Signed)
Patient ID: [name], female   DOB: 01-May-1933, 81 y.o.   MRN: [id number] Subjective: 1 Day Post-Op Procedure(s) (LRB): TOTAL LEFT KNEE ARTHROPLASTY (Left)    Patient reports pain as mild to moderate  Objective:   VITALS:   Filed Vitals:   11/30/14 0630  BP:   Pulse:   Temp:   Resp: 16    Neurovascular intact Incision: dressing C/D/I  LABS  Recent Labs  11/30/14 0502  HGB 9.2*  HCT 28.0*  WBC 10.9*  PLT 113*     Recent Labs  11/30/14 0502  NA 135  K 3.7  BUN 13  CREATININE 0.67  GLUCOSE 102*    No results for input(s): LABPT, INR in the last 72 hours.   Assessment/Plan: 1 Day Post-Op Procedure(s) (LRB): TOTAL LEFT KNEE ARTHROPLASTY (Left)   Advance diet Up with therapy Discharge home with home health today if progresses well with therapy  RTC in 2 weeks

## 2014-11-30 NOTE — Progress Notes (Signed)
Patient noted this am to have hypotension. BP of 83/38 on dinamap, took BP manually of 90/48. Spoke with [place], PA who ordered a 251mL Bolus to run at 223mL/hour. Patient stated being slightly light headed prior to bolus. Bolus administered with no s/s of acute distress. After bolus patients BP was 95/55. Made PA aware who stated to hold the spironolocatone and watch her BP. Checked BP again and it is now 106/59. Patient states feeling much better at this time. Will continue to monitor.

## 2014-11-30 NOTE — Plan of Care (Signed)
Problem: Consults Goal: Diagnosis- Total Joint Replacement Outcome: Completed/Met Date Met:  11/29/14 Primary Total Knee

## 2014-11-30 NOTE — Progress Notes (Signed)
Physical Therapy Treatment Patient Details Name: [name] MRN: [id number] DOB: 02/26/1934 Today's Date: 2014-12-24    History of Present Illness s/p  L TKA    PT Comments    Progressing well; LOB x 1 while going into bathroom and attempting to carry her RW-- with min to recover balance safely  Follow Up Recommendations  Home health PT     Equipment Recommendations  None recommended by PT    Recommendations for Other Services       Precautions / Restrictions Precautions Precautions: Knee Restrictions Other Position/Activity Restrictions: WBAT    Mobility  Bed Mobility                  Transfers Overall transfer level: Needs assistance Equipment used: Rolling walker (2 wheeled) Transfers: Sit to/from Stand Sit to Stand: Min assist         General transfer comment: cues for hand placement and safety  Ambulation/Gait Ambulation/Gait assistance: Min assist;Min guard Ambulation Distance (Feet): 80 Feet Assistive device: Rolling walker (2 wheeled) Gait Pattern/deviations: Step-to pattern;Step-through pattern     General Gait Details: cues for sequence, RW position, LOB x 1 going into bathroom requiring min assist to recover   Stairs            Wheelchair Mobility    Modified Rankin (Stroke Patients Only)       Balance                                    Cognition Arousal/Alertness: Awake/alert Behavior During Therapy: WFL for tasks assessed/performed Overall Cognitive Status: Within Functional Limits for tasks assessed                      Exercises Total Joint Exercises Ankle Circles/Pumps: AROM;Both;10 reps Quad Sets: AROM;Strengthening;Both;10 reps    General Comments        Pertinent Vitals/Pain Pain Assessment: 0-10 Pain Score: 4  Pain Location: L knee    Home Living                      Prior Function            PT Goals (current goals can now be found in the care plan section)  Acute Rehab PT Goals Patient Stated Goal: return to I lifestyle PT Goal Formulation: With patient Time For Goal Achievement: 12/06/14 Potential to Achieve Goals: Good Progress towards PT goals: Progressing toward goals    Frequency  7X/week    PT Plan Current plan remains appropriate    Co-evaluation             End of Session Equipment Utilized During Treatment: Gait belt Activity Tolerance: Patient tolerated treatment well Patient left: Other (comment);with call bell/phone within reach (in bathroom, RN aware)     Time: 2585-2778 PT Time Calculation (min) (ACUTE ONLY): 21 min  Charges:  $Gait Training: 8-22 mins                    G Codes:      [name] 12/24/14, 4:54 PM

## 2014-12-01 LAB — BASIC METABOLIC PANEL
Anion gap: 7 (ref 5–15)
BUN: 13 mg/dL (ref 6–20)
CO2: 27 mmol/L (ref 22–32)
Calcium: 8.9 mg/dL (ref 8.9–10.3)
Chloride: 102 mmol/L (ref 101–111)
Creatinine, Ser: 0.6 mg/dL (ref 0.44–1.00)
GFR calc Af Amer: >60 mL/min (ref >60)
GFR calc non Af Amer: >60 mL/min (ref >60)
Glucose, Bld: 112 mg/dL — ABNORMAL HIGH (ref 65–99)
Potassium: 3.6 mmol/L (ref 3.5–5.1)
Sodium: 136 mmol/L (ref 135–145)

## 2014-12-01 LAB — CBC
HCT: 31.5 % — ABNORMAL LOW (ref 36.0–46.0)
Hemoglobin: 10.2 g/dL — ABNORMAL LOW (ref 12.0–15.0)
MCH: 28.1 pg (ref 26.0–34.0)
MCHC: 32.4 g/dL (ref 30.0–36.0)
MCV: 86.8 fL (ref 78.0–100.0)
Platelets: 113 10*3/uL — ABNORMAL LOW (ref 150–400)
RBC: 3.63 MIL/uL — ABNORMAL LOW (ref 3.87–5.11)
RDW: 15.2 % (ref 11.5–15.5)
WBC: 14.3 10*3/uL — ABNORMAL HIGH (ref 4.0–10.5)

## 2014-12-01 MED ORDER — TRAMADOL HCL 50 MG PO TABS: 50.0000 mg | ORAL_TABLET | Freq: Four times a day (QID) | ORAL | Status: DC

## 2014-12-01 MED ORDER — POLYETHYLENE GLYCOL 3350 17 G PO PACK: 17.0000 g | PACK | Freq: Two times a day (BID) | ORAL | Status: DC

## 2014-12-01 MED ORDER — DOCUSATE SODIUM 100 MG PO CAPS: 100.0000 mg | ORAL_CAPSULE | Freq: Two times a day (BID) | ORAL | Status: DC

## 2014-12-01 MED ORDER — FERROUS SULFATE 325 (65 FE) MG PO TABS: 325.0000 mg | ORAL_TABLET | Freq: Three times a day (TID) | ORAL | Status: DC

## 2014-12-01 MED ORDER — RIVAROXABAN 10 MG PO TABS: 10.0000 mg | ORAL_TABLET | ORAL | Status: DC

## 2014-12-01 MED ORDER — TIZANIDINE HCL 4 MG PO TABS: 4.0000 mg | ORAL_TABLET | Freq: Four times a day (QID) | ORAL | Status: DC | PRN

## 2014-12-01 MED ORDER — ACETAMINOPHEN 325 MG PO TABS: 650.0000 mg | ORAL_TABLET | Freq: Four times a day (QID) | ORAL | Status: DC | PRN

## 2014-12-01 NOTE — Progress Notes (Signed)
Physical Therapy Treatment Patient Details Name: [name] MRN: [id number] DOB: 1933/07/09 Today's Date: 12/01/2014    History of Present Illness s/p  L TKA    PT Comments    Pt ambulated in hallway and practiced safe stair technique.  Pt reports flight to bedroom however able to sleep downstairs initially for safety.  Pt also performed LE exercises.  Provided stair handout for spouse to review (not present during session).   Follow Up Recommendations  Home health PT     Equipment Recommendations  None recommended by PT    Recommendations for Other Services       Precautions / Restrictions Precautions Precautions: Knee Restrictions Weight Bearing Restrictions: No Other Position/Activity Restrictions: WBAT    Mobility  Bed Mobility Overal bed mobility: Needs Assistance Bed Mobility: Supine to Sit     Supine to sit: Supervision Sit to supine: Supervision   General bed mobility comments: able to self assist L LE without cues  Transfers Overall transfer level: Needs assistance Equipment used: Rolling walker (2 wheeled) Transfers: Sit to/from Stand Sit to Stand: Min guard         General transfer comment: for safety; cues for UE/LE placement  Ambulation/Gait Ambulation/Gait assistance: Min guard Ambulation Distance (Feet): 120 Feet Assistive device: Rolling walker (2 wheeled) Gait Pattern/deviations: Step-through pattern;Antalgic;Decreased stance time - left     General Gait Details: verbal cues for RW positioning   Stairs Stairs: Yes Stairs assistance: Min guard Stair Management: Step to pattern;Backwards;With walker Number of Stairs: 2 General stair comments: verbal cues for sequence and safety, RW positioning, performed twice, pt reports understanding, handout provided for spouse to review (pt aware spouse needs to hold RW)  Wheelchair Mobility    Modified Rankin (Stroke Patients Only)       Balance                                     Cognition Arousal/Alertness: Awake/alert Behavior During Therapy: WFL for tasks assessed/performed Overall Cognitive Status: Within Functional Limits for tasks assessed                      Exercises Total Joint Exercises Ankle Circles/Pumps: AROM;Both;10 reps Quad Sets: AROM;Strengthening;Both;10 reps Towel Squeeze: AROM;Both;10 reps Short Arc Quad: Strengthening;AROM;Left;10 reps Heel Slides: AROM;AAROM;Left;15 reps Hip ABduction/ADduction: AROM;Strengthening;Left;15 reps Straight Leg Raises: AROM;Strengthening;Left;10 reps    General Comments        Pertinent Vitals/Pain Pain Assessment: 0-10 Pain Score: 3  Pain Location: L knee Pain Descriptors / Indicators: Sore Pain Intervention(s): Limited activity within patient's tolerance;Monitored during session;Premedicated before session;Repositioned    Home Living Family/patient expects to be discharged to:: Private residence Living Arrangements: Spouse/significant other Available Help at Discharge: Family;Available 24 hours/day         Home Equipment: Bedside commode;Walker - 2 wheels      Prior Function Level of Independence: Independent          PT Goals (current goals can now be found in the care plan section) Acute Rehab PT Goals Patient Stated Goal: return to I lifestyle Progress towards PT goals: Progressing toward goals    Frequency  7X/week    PT Plan Current plan remains appropriate    Co-evaluation             End of Session Equipment Utilized During Treatment: Gait belt Activity Tolerance: Patient tolerated treatment well Patient left: in bed;with  call bell/phone within reach     Time: 1114-1140 PT Time Calculation (min) (ACUTE ONLY): 26 min  Charges:  $Gait Training: 8-22 mins $Therapeutic Exercise: 8-22 mins                    G Codes:      [name],[name] 12/21/2014, 11:50 AM [name], PT, DPT 12/21/2014 Pager: [phone]

## 2014-12-01 NOTE — Evaluation (Signed)
Occupational Therapy Evaluation Patient Details Name: [name] MRN: [id number] DOB: [birth year] Today's Date: 12/01/2014    History of Present Illness  L TKA   Clinical Impression   This 81 year old female was admitted for the above surgery.  She will benefit from skilled OT to reinforce safety during adls and bathroom transfers.  Recommend HHOT follow up     Follow Up Recommendations  Home health OT;Supervision/Assistance - 24 hour    Equipment Recommendations  None recommended by OT    Recommendations for Other Services       Precautions / Restrictions Precautions Precautions: Knee Restrictions Weight Bearing Restrictions: No Other Position/Activity Restrictions: WBAT      Mobility Bed Mobility   Bed Mobility: Supine to Sit     Supine to sit: Min guard     General bed mobility comments: for safety; hob raised  Transfers   Equipment used: Rolling walker (2 wheeled) Transfers: Sit to/from Stand Sit to Stand: Min guard         General transfer comment: for safety; cues for UE/LE placement    Balance                                            ADL Overall ADL's : Needs assistance/impaired     Grooming: Oral care;Set up;Sitting   Upper Body Bathing: Set up;Sitting   Lower Body Bathing: Minimal assistance;Sit to/from stand   Upper Body Dressing : Set up;Sitting   Lower Body Dressing: Moderate assistance;Sit to/from stand   Toilet Transfer: Minimal assistance;Ambulation;BSC;RW   Toileting- Water quality scientist and Hygiene: Min guard;Sit to/from stand         General ADL Comments: performed ADL and ambulated to bathroom; did not practice shower transfer. Pt impulsive:  tried to stand without walker twice and lifted walker around obstacles twice, despite cues.  Pt shaky, and she states that she has been worked up for this.  Dyspnea 2/4; sats 98% on RA     Vision     Perception     Praxis      Pertinent  Vitals/Pain Pain Score: 2  Pain Location: L knee Pain Descriptors / Indicators: Sore Pain Intervention(s): Limited activity within patient's tolerance;Monitored during session;Premedicated before session;Repositioned (removed ice)     Hand Dominance     Extremity/Trunk Assessment Upper Extremity Assessment Upper Extremity Assessment: Overall WFL for tasks assessed           Communication Communication Communication: No difficulties (soft spoken)   Cognition Arousal/Alertness: Awake/alert Behavior During Therapy: Impulsive Overall Cognitive Status: No family/caregiver present to determine baseline cognitive functioning (decreased safety)                     General Comments       Exercises       Shoulder Instructions      Home Living Family/patient expects to be discharged to:: Private residence Living Arrangements: Spouse/significant other Available Help at Discharge: Family;Available 24 hours/day               Bathroom Shower/Tub: Occupational psychologist: Standard     Home Equipment: Bedside commode;Walker - 2 wheels          Prior Functioning/Environment Level of Independence: Independent             OT Diagnosis: Generalized weakness   OT  Problem List: Decreased strength;Decreased activity tolerance;Pain;Decreased safety awareness   OT Treatment/Interventions: Self-care/ADL training;DME and/or AE instruction;Patient/family education    OT Goals(Current goals can be found in the care plan section) Acute Rehab OT Goals Patient Stated Goal: return to I lifestyle OT Goal Formulation: With patient Time For Goal Achievement: 12/08/14 Potential to Achieve Goals: Good ADL Goals Pt Will Transfer to Toilet: with supervision;ambulating;bedside commode Pt Will Perform Tub/Shower Transfer: with min guard assist;Shower transfer;ambulating;3 in 1 Additional ADL Goal #1: husband will independently cue pt for safety during adls/bathroom  transfers  OT Frequency: Min 2X/week   Barriers to D/C:            Co-evaluation              End of Session Nurse Communication:  (impulsive)  Activity Tolerance: Patient tolerated treatment well Patient left: in chair;with chair alarm set;with call bell/phone within reach   Time: 0819-0853 OT Time Calculation (min): 34 min Charges:  OT General Charges $OT Visit: 1 Procedure OT Evaluation $Initial OT Evaluation Tier I: 1 Procedure OT Treatments $Self Care/Home Management : 8-22 mins G-Codes:    Lasean Gorniak 12-21-14, 9:13 AM Lesle Chris, OTR/L [phone] Dec 21, 2014

## 2014-12-01 NOTE — Progress Notes (Signed)
     Subjective: 2 Days Post-Op Procedure(s) (LRB): TOTAL LEFT KNEE ARTHROPLASTY (Left)   Patient reports pain as mild, pain controlled. No events throughout the night. C/o a sore throat this morning. Feels ready to be discharged home.  Objective:   VITALS:   Filed Vitals:   12/01/14 0508  BP: 112/70  Pulse: 78  Temp: 98.4 F (36.9 C)  Resp: 16    Dorsiflexion/Plantar flexion intact Incision: dressing C/D/I No cellulitis present Compartment soft  LABS  Recent Labs  11/30/14 0502 12/01/14 0446  HGB 9.2* 10.2*  HCT 28.0* 31.5*  WBC 10.9* 14.3*  PLT 113* 113*     Recent Labs  11/30/14 0502 12/01/14 0446  NA 135 136  K 3.7 3.6  BUN 13 13  CREATININE 0.67 0.60  GLUCOSE 102* 112*     Assessment/Plan: 2 Days Post-Op Procedure(s) (LRB): TOTAL LEFT KNEE ARTHROPLASTY (Left) Up with therapy Discharge home with home health  Follow up in 2 weeks at [facility]. Follow up with OLIN,Valla Pacey D in 2 weeks.  Contact information:  [facility] [address], [address] [place] Tanaysia Bhardwaj   PAC  12/01/2014, 8:14 AM

## 2014-12-01 NOTE — Care Management Note (Signed)
Case Management Note  Patient Details  Name: ANGELLICA MADDISON MRN: [id number] Date of Birth: Jun 03, 1933  Subjective/Objective:                  TOTAL LEFT KNEE ARTHROPLASTY (Left)  Action/Plan:  dsicharge planning Expected Discharge Date:  12/01/14               Expected Discharge Plan:  [place]  In-House Referral:     Discharge planning Services  CM Consult  Post Acute Care Choice:  Home Health Choice offered to:  Patient  DME Arranged:  3-N-1 DME Agency:  [place]:  PT [place] Agency:  [place]  Status of Service:  Completed, signed off  Medicare Important Message Given:    Date Medicare IM Given:    Medicare IM give by:    Date Additional Medicare IM Given:    Additional Medicare Important Message give by:     If discussed at [place] of Stay Meetings, dates discussed:    Additional Comments: CM met with pt in room to offer choice of home health agency.  Pt chooses [facility] to render HHPT.  Address and contact information verified by pt.  Referral called to [facility] rep, Kristen.  CM called [facility] DME rep, Lecretia to please deliver the 3n1 to room so pt can discharge.  No other CM needs were communicated. Dellie Catholic, RN 12/01/2014, 1:25 PM

## 2014-12-01 NOTE — Care Management Important Message (Signed)
Important Message  Patient Details  Name: [name] MRN: [id number] Date of Birth: 02-04-1934   Medicare Important Message Given:  Yes-second notification given    Camillo Flaming 12/01/2014, 2:07 [place] Message  Patient Details  Name: [name] MRN: [id number] Date of Birth: 1933-12-01   Medicare Important Message Given:  Yes-second notification given    Camillo Flaming 12/01/2014, 2:07 PM

## 2014-12-02 ENCOUNTER — Telehealth: Payer: Self-pay | Admitting: Internal Medicine

## 2014-12-02 NOTE — Telephone Encounter (Signed)
Noted  

## 2014-12-02 NOTE — Telephone Encounter (Signed)
Called pt regarding TOC (transition of Care).  N/A answer..Left message to call PCP office...cdavis  Pt need follow up appt with [facility] w/i 2 weeks...cdavis

## 2014-12-08 NOTE — Discharge Summary (Signed)
Physician Discharge Summary  Patient ID: [name] MRN: [id number] DOB/AGE: November 02, 1933 81 y.o.  Admit date: 11/29/2014 Discharge date: 12/01/2014   Procedures:  Procedure(s) (LRB): TOTAL LEFT KNEE ARTHROPLASTY (Left)  Attending Physician:  Dr. Paralee Cancel   Admission Diagnoses:   Left knee primary OA / pain  Discharge Diagnoses:  Principal Problem:   S/P left TKA Active Problems:   S/P knee replacement  Past Medical History  Diagnosis Date  . Syncope   . Systolic CHF   . Depression   . ICD (implantable cardiac defibrillator), biventricular, in situ   . LBBB (left bundle branch block)   . Dyslipidemia   . CHF (congestive heart failure)     PACEMAKER & DEFIB  . Fainted 04/21/06    AT [place]  . Hypertension   . Arthritis   . Wears glasses   . Headache(784.0)   . GERD (gastroesophageal reflux disease)   . HLD (hyperlipidemia)   . Hypothyroidism   . Cancer     left breast cancer   . ICD (implantable cardiac defibrillator) in place     pt has pacer/icd  . Pacemaker     Guidant Device  . Complication of anesthesia     hypotensive after back surgery in 2006- reports on chart   . Nonischemic cardiomyopathy   . Normal coronary arteries     s/p cardiac cath 2007  . Memory loss   . Hearing loss   . Vertigo     HPI:    [name], 81 y.o. female, has a history of pain and functional disability in the left knee due to arthritis and has failed non-surgical conservative treatments for greater than 12 weeks to include NSAID's and/or analgesics, corticosteriod injections and activity modification. Onset of symptoms was gradual, starting 2+ years ago with gradually worsening course since that time. The patient noted prior procedures on the knee to include arthroplasty on the right knee about 12 years ago. Patient currently rates pain in the left knee(s) at 7 out of 10 with activity. Patient has worsening of pain with activity and weight bearing, pain that interferes  with activities of daily living, pain with passive range of motion, crepitus and joint swelling. Patient has evidence of periarticular osteophytes and joint space narrowing by imaging studies. There is no active infection. Risks, benefits and expectations were discussed with the patient. Risks including but not limited to the risk of anesthesia, blood clots, nerve damage, blood vessel damage, failure of the prosthesis, infection and up to and including death. Patient understand the risks, benefits and expectations and wishes to proceed with surgery.  PCP: Stacey Cranker, DO   Discharged Condition: good  Hospital Course:  Patient underwent the above stated procedure on 11/29/2014. Patient tolerated the procedure well and brought to the recovery room in good condition and subsequently to the floor.  POD #1  BP: 108/52 ; Pulse: 68 ; Temp: 98 F (36.7 C) ; Resp: 16 Patient reports pain as mild to moderate. No events throughout the night. Dorsiflexion/plantar flexion intact, incision: dressing C/D/I, no cellulitis present and compartment soft.   LABS  Basename    HGB  9.2  HCT  28.0   POD #2 BP: 112/70 ; Pulse: 78 ; Temp: 98.4 F (36.9 C) ; Resp: 16 Patient reports pain as mild, pain controlled. No events throughout the night. C/o a sore throat this morning. Feels ready to be discharged home. Dorsiflexion/plantar flexion intact, incision: dressing C/D/I, no cellulitis present and  compartment soft.   LABS  Basename    HGB  10.2  HCT  31.5    Discharge Exam: General appearance: alert, cooperative and no distress Extremities: Homans sign is negative, no sign of DVT, no edema, redness or tenderness in the calves or thighs and no ulcers, gangrene or trophic changes  Disposition: Home with follow up in 2 weeks   Follow-up Information    Follow up with Stacey Pole, MD. Schedule an appointment as soon as possible for a visit in 2 weeks.   Specialty:  Orthopedic Surgery   Contact  information:   [address] [place] [address] [phone]       Follow up with [name].   Why:  home health physical therapy   Contact information:   [address] [place] [address] [phone]       Follow up with [name].   Why:  3n1   Contact information:   [address] [place] [address] [phone]       Discharge Instructions    Call MD / Call 911    Complete by:  As directed   If you experience chest pain or shortness of breath, CALL 911 and be transported to the hospital emergency room.  If you develope a fever above 101 F, pus (white drainage) or increased drainage or redness at the wound, or calf pain, call your surgeon's office.     Change dressing    Complete by:  As directed   Maintain surgical dressing until follow up in the clinic. If the edges start to pull up, may reinforce with tape. If the dressing is no longer working, may remove and cover with gauze and tape, but must keep the area dry and clean.  Call with any questions or concerns.     Constipation Prevention    Complete by:  As directed   Drink plenty of fluids.  Prune juice may be helpful.  You may use a stool softener, such as Colace (over the counter) 100 mg twice a day.  Use MiraLax (over the counter) for constipation as needed.     Diet - low sodium heart healthy    Complete by:  As directed      Discharge instructions    Complete by:  As directed   Maintain surgical dressing until follow up in the clinic. If the edges start to pull up, may reinforce with tape. If the dressing is no longer working, may remove and cover with gauze and tape, but must keep the area dry and clean.  Follow up in 2 weeks at [facility]. Call with any questions or concerns.     Increase activity slowly as tolerated    Complete by:  As directed   Weight bearing as tolerated with assist device (walker, cane, etc) as  directed, use it as long as suggested by your surgeon or therapist, typically at least 4-6 weeks.     TED hose    Complete by:  As directed   Use stockings (TED hose) for 2 weeks on both leg(s).  You may remove them at night for sleeping.             Medication List    TAKE these medications        acetaminophen 325 MG tablet  Commonly known as:  TYLENOL  Take 2 tablets (650 mg total) by mouth every 6 (six) hours as  needed for mild pain (or Fever >/= 101).     buPROPion 150 MG 24 hr tablet  Commonly known as:  WELLBUTRIN XL  Take 450 mg by mouth daily.     calcium carbonate 600 MG Tabs tablet  Commonly known as:  OS-CAL  Take 600 mg by mouth daily with breakfast.     CoQ10 200 MG Caps  Take by mouth daily.     docusate sodium 100 MG capsule  Commonly known as:  COLACE  Take 1 capsule (100 mg total) by mouth 2 (two) times daily.     ferrous sulfate 325 (65 FE) MG tablet  Take 1 tablet (325 mg total) by mouth 3 (three) times daily after meals.     Fish Oil 1200 MG Caps  Take 1 capsule by mouth every morning.     furosemide 40 MG tablet  Commonly known as:  LASIX  40 mg in the morning and 20 mg at night     levothyroxine 100 MCG tablet  Commonly known as:  SYNTHROID, LEVOTHROID  Take 100 mcg by mouth daily before breakfast.     Magnesium 400 MG Caps  Take 1 capsule by mouth daily.     metoprolol tartrate 25 MG tablet  Commonly known as:  LOPRESSOR  Take 12.5-25 mg by mouth 2 (two) times daily. 1 by mouth in the morning, 1/2 tab by mouth in the evening     olmesartan 40 MG tablet  Commonly known as:  BENICAR  Take 1 tablet (40 mg total) by mouth every morning. For blood pressure     omeprazole 40 MG capsule  Commonly known as:  PRILOSEC  Take one tablet by mouth once daily for stomach     Oxcarbazepine 300 MG tablet  Commonly known as:  TRILEPTAL  Take 300 mg by mouth every morning.     polyethylene glycol packet  Commonly known as:  MIRALAX / GLYCOLAX    Take 17 g by mouth 2 (two) times daily.     potassium chloride 10 MEQ tablet  Commonly known as:  K-DUR  Take 1 tablet (10 mEq total) by mouth daily.     rivaroxaban 10 MG Tabs tablet  Commonly known as:  XARELTO  Take 1 tablet (10 mg total) by mouth daily.     sertraline 100 MG tablet  Commonly known as:  ZOLOFT  Take 200 mg by mouth every morning.     spironolactone 25 MG tablet  Commonly known as:  ALDACTONE  Take 1 tablet (25 mg total) by mouth every morning.     tiZANidine 4 MG tablet  Commonly known as:  ZANAFLEX  Take 1 tablet (4 mg total) by mouth every 6 (six) hours as needed for muscle spasms.     traMADol 50 MG tablet  Commonly known as:  ULTRAM  Take 1-2 tablets (50-100 mg total) by mouth every 6 (six) hours.     Vitamin D-3 1000 UNITS Caps  Take by mouth daily.     vitamin E 1000 UNIT capsule  Take 1,000 Units by mouth daily.         Signed: [place] Pugh. Azaliah Carrero   PA-C  12/08/2014, 9:53 AM

## 2014-12-09 ENCOUNTER — Encounter: Payer: Self-pay | Admitting: Cardiology

## 2014-12-14 ENCOUNTER — Ambulatory Visit (HOSPITAL_COMMUNITY)
Admission: RE | Admit: 2014-12-14 | Discharge: 2014-12-14 | Disposition: A | Discharge status: Home / Self Care | Payer: Medicare Other | Source: Ambulatory Visit | Attending: Cardiovascular Disease | Admitting: Cardiovascular Disease

## 2014-12-14 ENCOUNTER — Other Ambulatory Visit: Payer: Self-pay | Admitting: Orthopedic Surgery

## 2014-12-14 DIAGNOSIS — I10 Essential (primary) hypertension: Secondary | ICD-10-CM | POA: Diagnosis not present

## 2014-12-14 DIAGNOSIS — M7989 Other specified soft tissue disorders: Secondary | ICD-10-CM

## 2014-12-14 DIAGNOSIS — E785 Hyperlipidemia, unspecified: Secondary | ICD-10-CM | POA: Insufficient documentation

## 2014-12-21 ENCOUNTER — Encounter: Payer: Self-pay | Admitting: Internal Medicine

## 2014-12-23 ENCOUNTER — Encounter: Payer: Self-pay | Admitting: Cardiology

## 2014-12-27 NOTE — Progress Notes (Signed)
Cardiology Office Note   Date:  12/28/2014   [name], DOB Aug 08, 1933, MRN [id number]  PCP:  [name], DO  Cardiologist:  [name], MD   Chief Complaint  Patient presents with  . Congestive Heart Failure      History of Present Illness: [name] is a 81 y.o. female who presents for nonischemic cardiomyopathy, chronic systolic heart failure, AICD, and left bundle-branch block. She also has a history of essential hypertension and hyperlipidemia that [name] by primary care physicians.  Had recent left knee surgery. Getting ready to start cardiac rehabilitation. No difficulty with heart failure or other cardiac problems doing and anesthesia or in the early recovery period she denies lightheadedness, dizziness, and syncope. Appetite has been poor.  Past Medical History  Diagnosis Date  . Syncope   . Systolic CHF   . Depression   . ICD (implantable cardiac defibrillator), biventricular, in situ   . LBBB (left bundle branch block)   . Dyslipidemia   . CHF (congestive heart failure)     PACEMAKER & DEFIB  . Fainted 04/21/06    AT [place]  . Hypertension   . Arthritis   . Wears glasses   . Headache(784.0)   . GERD (gastroesophageal reflux disease)   . HLD (hyperlipidemia)   . Hypothyroidism   . Cancer     left breast cancer   . ICD (implantable cardiac defibrillator) in place     pt has pacer/icd  . Pacemaker     Guidant Device  . Complication of anesthesia     hypotensive after back surgery in 2006- reports on chart   . Nonischemic cardiomyopathy   . Normal coronary arteries     s/p cardiac cath 2007  . Memory loss   . Hearing loss   . Vertigo     Past Surgical History  Procedure Laterality Date  . Total knee arthroplasty  05/17/01    RIGHT KNEE  . Lumbar fusion  2006  . Mastectomy, partial  2008    GOT PACEMAKER AND DEFIB AT THAT TIME  . Breast surgery  2000    LUMP REMOVAL. STAGE 1 CANCER  . Joint replacement  06/14/01    right    . Pacemaker insertion  04/23/06  . Back surgery      lumbar fusion   . Cardiac catheterization    . Mass excision  11/08/2011    Procedure: EXCISION MASS;  Surgeon: [name], MD;  Location: [facility] ORS;  Service: General;  Laterality: Left;  Excision Left Thigh Mass  . Implantable cardioverter defibrillator generator change N/A 12/18/2012    Procedure: IMPLANTABLE CARDIOVERTER DEFIBRILLATOR GENERATOR CHANGE;  Surgeon: [name], MD;  Location: [facility] CATH LAB;  Service: Cardiovascular;  Laterality: N/A;  . Cataract extraction    . Eye surgery    . Total knee arthroplasty Left 11/29/2014    Procedure: TOTAL LEFT KNEE ARTHROPLASTY;  Surgeon: Paralee Cancel, MD;  Location: [facility] ORS;  Service: Orthopedics;  Laterality: Left;     Current Outpatient Prescriptions  Medication Sig Dispense Refill  . acetaminophen (TYLENOL) 325 MG tablet Take 2 tablets (650 mg total) by mouth every 6 (six) hours as needed for mild pain (or Fever >/= 101).    Marland Kitchen buPROPion (WELLBUTRIN XL) 150 MG 24 hr tablet Take 450 mg by mouth daily.    . calcium carbonate (OS-CAL) 600 MG TABS tablet Take 600 mg by mouth daily with breakfast.    .  cholecalciferol (VITAMIN D) 1000 UNITS tablet Take 1,000 Units by mouth daily.    . Coenzyme Q10 200 MG capsule Take 200 mg by mouth daily.    . furosemide (LASIX) 40 MG tablet Take one (1) tablet (40 mg total) by mouth every morning and and half (1/2) tablet (20 mg total) by mouth every evening.    Marland Kitchen HYDROcodone-acetaminophen (NORCO/VICODIN) 5-325 MG per tablet Take 1-2 tablets by mouth every 6 (six) hours as needed for moderate pain.    Marland Kitchen levothyroxine (SYNTHROID, LEVOTHROID) 100 MCG tablet Take 100 mcg by mouth daily before breakfast.    . Magnesium 400 MG CAPS Take 400 mg by mouth daily.    . metoprolol tartrate (LOPRESSOR) 25 MG tablet Take one (1) tablet (25 mg total) by mouth every morning and take half (1/2) tablet (12.5 mg total) by mouth every evening.    . olmesartan (BENICAR) 40 MG  tablet Take 1 tablet (40 mg total) by mouth every morning. For blood pressure 90 tablet 3  . Omega-3 Fatty Acids (FISH OIL) 1200 MG CAPS Take 1,200 mg by mouth every morning.    Marland Kitchen omeprazole (PRILOSEC) 40 MG capsule Take one tablet by mouth once daily for stomach 30 capsule 5  . Oxcarbazepine (TRILEPTAL) 300 MG tablet Take 300 mg by mouth every morning.     . polyethylene glycol (MIRALAX / GLYCOLAX) packet Take 17 g by mouth 2 (two) times daily. 14 each 0  . potassium chloride (K-DUR) 10 MEQ tablet Take 1 tablet (10 mEq total) by mouth daily. 90 tablet 3  . rivaroxaban (XARELTO) 10 MG TABS tablet Take 1 tablet (10 mg total) by mouth daily. 14 tablet 0  . sertraline (ZOLOFT) 100 MG tablet Take 200 mg by mouth every morning.     Marland Kitchen spironolactone (ALDACTONE) 25 MG tablet Take 1 tablet (25 mg total) by mouth every morning. 90 tablet 3  . tiZANidine (ZANAFLEX) 4 MG tablet Take 1 tablet (4 mg total) by mouth every 6 (six) hours as needed for muscle spasms. 40 tablet 0  . traMADol (ULTRAM) 50 MG tablet Take 50-100 mg by mouth every 6 (six) hours as needed for moderate pain or severe pain.    . vitamin E 1000 UNIT capsule Take 1,000 Units by mouth daily.     No current facility-administered medications for this visit.    Allergies:   Shellfish allergy; Iodine; Aspirin; and Codeine    Social History:  The patient  reports that she has never smoked. She has never used smokeless tobacco. She reports that she does not drink alcohol or use illicit drugs.   Family History:  The patient's family history includes Arthritis in her mother; Hypertension in her brother, brother, father, and mother.    ROS:  Please see the history of present illness.   Otherwise, review of systems are positive for decreased appetite, some weight loss, continued left knee discomfort although improved. Constipation, cough, hearing loss, left lower extremity ankle edema, and knee pain..   All other systems are reviewed and negative.     PHYSICAL EXAM: VS:  BP 90/56 mmHg  Pulse 78  Ht 4\' 10"  (1.473 m)  Wt 57.516 kg (126 lb 12.8 oz)  BMI 26.51 kg/m2  SpO2 97% , BMI Body mass index is 26.51 kg/(m^2). GEN: Well nourished, well developed, in no acute distress HEENT: normal Neck: no JVD, carotid bruits, or masses Cardiac: RRR.  There is no murmur, rub, or gallop. There is trace left ankle edema.  Respiratory:  clear to auscultation bilaterally, normal work of breathing. GI: soft, nontender, nondistended, + BS MS: no deformity or atrophy. Left knee is warm but not draining. Skin: warm and dry, no rash Neuro:  Strength and sensation are intact Psych: euthymic mood, full affect   EKG:  EKG not performed/ ordered today. The ekg from January 2016 demonstrates sinus rhythm with atrial tracking and intermittent ventricular pacing.   Recent Labs: 07/28/2014: ALT 26; TSH 2.210 12/01/2014: BUN 13; Creatinine, Ser 0.60; Hemoglobin 10.2*; Platelets 113*; Potassium 3.6; Sodium 136    Lipid Panel    Component Value Date/Time   CHOL 178 11/26/2014 1604   CHOL  01/02/2010 0554    101        ATP III CLASSIFICATION:  <200     mg/dL   Desirable  200-239  mg/dL   Borderline High  >=240    mg/dL   High          TRIG 147 11/26/2014 1604   HDL 64 11/26/2014 1604   HDL 32* 01/02/2010 0554   CHOLHDL 2.8 11/26/2014 1604   CHOLHDL 3.2 01/02/2010 0554   VLDL 30 01/02/2010 0554   LDLCALC 85 11/26/2014 1604   LDLCALC  01/02/2010 0554    39        Total Cholesterol/HDL:CHD Risk Coronary Heart Disease Risk Table                     Men   Women  1/2 Average Risk   3.4   3.3  Average Risk       5.0   4.4  2 X Average Risk   9.6   7.1  3 X Average Risk  23.4   11.0        Use the calculated Patient Ratio above and the CHD Risk Table to determine the patient's CHD Risk.        ATP III CLASSIFICATION (LDL):  <100     mg/dL   Optimal  100-129  mg/dL   Near or Above                    Optimal  130-159  mg/dL   Borderline   160-189  mg/dL   High  >190     mg/dL   Very High      Wt Readings from Last 3 Encounters:  12/28/14 57.516 kg (126 lb 12.8 oz)  11/29/14 55.339 kg (122 lb)  11/26/14 55.52 kg (122 lb 6.4 oz)      Other studies Reviewed: Additional studies/ records that were reviewed today include: Review of laboratory data from recent knee surgery reveals normal kidney function and potassium. Hemoglobin is 10.2.    ASSESSMENT AND PLAN:  1. Chronic systolic heart failure No evidence of volume overload  2. Benign hypertension Low blood pressure but asymptomatic  3. ICD (implantable cardioverter-defibrillator), biventricular, in situ Normal function when last evaluated    Current medicines are reviewed at length with the patient today.  The patient has the following concerns regarding medicines: No changes needed.  The following changes/actions have been instituted:    Encouraged increase physical activity  Labs/ tests ordered today include:  No orders of the defined types were placed in this encounter.     Disposition:   FU with HS in 1 year  Signed, [name], MD  12/28/2014 9:59 AM    [facility] [address], [place], [place]  [address]  Phone: [phone]; Fax: [phone]

## 2014-12-28 ENCOUNTER — Encounter: Payer: Self-pay | Admitting: Interventional Cardiology

## 2014-12-28 ENCOUNTER — Ambulatory Visit (INDEPENDENT_AMBULATORY_CARE_PROVIDER_SITE_OTHER): Payer: Medicare Other | Admitting: Interventional Cardiology

## 2014-12-28 VITALS — BP 90/56 | HR 78 | Ht <= 58 in | Wt 126.8 lb

## 2014-12-28 DIAGNOSIS — Z95810 Presence of automatic (implantable) cardiac defibrillator: Secondary | ICD-10-CM | POA: Diagnosis not present

## 2014-12-28 DIAGNOSIS — I5022 Chronic systolic (congestive) heart failure: Secondary | ICD-10-CM

## 2014-12-28 DIAGNOSIS — I10 Essential (primary) hypertension: Secondary | ICD-10-CM | POA: Diagnosis not present

## 2014-12-28 NOTE — Patient Instructions (Signed)
Medication Instructions:  Your physician recommends that you continue on your current medications as directed. Please refer to the Current Medication list given to you today.   Labwork: None ordered  Testing/Procedures: None ordered  Follow-Up: Your physician wants you to follow-up in: 1 year with Dr.Smith You will receive a reminder letter in the mail two months in advance. If you don't receive a letter, please call our office to schedule the follow-up appointment.   Any Other Special Instructions Will Be Listed Below (If Applicable).   

## 2015-01-12 ENCOUNTER — Other Ambulatory Visit: Payer: Self-pay | Admitting: *Deleted

## 2015-01-12 MED ORDER — METOPROLOL TARTRATE 25 MG PO TABS: ORAL_TABLET | ORAL | Status: DC

## 2015-01-20 ENCOUNTER — Encounter: Payer: Self-pay | Admitting: Nurse Practitioner

## 2015-01-20 ENCOUNTER — Ambulatory Visit (INDEPENDENT_AMBULATORY_CARE_PROVIDER_SITE_OTHER): Payer: Medicare Other | Admitting: Nurse Practitioner

## 2015-01-20 VITALS — BP 109/66 | HR 79 | Ht 59.0 in | Wt 130.4 lb

## 2015-01-20 DIAGNOSIS — F329 Major depressive disorder, single episode, unspecified: Secondary | ICD-10-CM | POA: Diagnosis not present

## 2015-01-20 DIAGNOSIS — R413 Other amnesia: Secondary | ICD-10-CM

## 2015-01-20 DIAGNOSIS — F32A Depression, unspecified: Secondary | ICD-10-CM

## 2015-01-20 NOTE — Progress Notes (Signed)
[facility]  PATIENT: [name] DOB: 16-Apr-1933   REASON FOR VISIT: Follow-up for memory loss HISTORY FROM: Patient    HISTORY OF PRESENT ILLNESS: HISTORY:[name] is a 81 yo RH AAF referred by her primary care Dr. Baird Cancer for evaluation of memory trouble, drive herself to office alone at today's clinical visit. She is a retired Writer in 2003, she retired at age 73 because of her right knee pain, she had right knee replacement, in 2004, she also had lumbar decompression surgery by Dr. Nicholes Calamity under general anesthesia, woke up from surgery, she noticed mild memory trouble, she has short-term memory trouble, has been persistent since then, she denies difficulty talking, no strokelike symptoms then. She lives at home with her family, highly functional, driving, independent at daily activity, able to keep her check in balance, She suffered long-standing history of bipolar disorder, on polypharmacy treatment, this including Trileptal 300 mg daily, Zoloft 100 mg a day, Wellbutrin 150 mg 3 tablets a day, She had accident of sudden onset dizziness couple days ago, after dinner, she felt lightheaded, has to crawling upstairs, was helped by her husband to get up, then she fell to the ground, whole-body shaking, no loss of consciousness. She has baseline mild gait difficulty due to her low back pain, bilateral knee pain, She denied a family history of dementia, CT head in 2013, Unchanged mild atrophy and microvascular ischemic disease without acute intracranial process. She had a history of chronic systolic heart failure, left bundle branch block, status post biventricular ICD insertion in 2009, underwent device generator change out Sept 2014. S She presents for one year evaluation. She reports feeling dizzy Tuesday night with improvement by Wednesday morning. She also reports upper body "shaking". The only pain she has is from arthritis. She  gets some mild edema in her right ankle. She reports her blood pressure at home usually runs 110-120/60-70. Sometimes when she gets up quickly, she felt lightheaded, UPDATE April 8th 2016:YYShe is overall doing very well, only has occasionally dizziness, especially when she gets up quickly, today's Mini-Mental status examination is 29 out of 30 CAT scan of the brain showed mild small vessel disease, no acute lesions, EEG showed mild slowing  UPDATE: 10/13/2016CM [name], 81 year old female returns for follow-up. She has a history of memory loss. Her Mini-Mental Status exam today is 28 out of 30. She continues to cook without difficulty she continues to drive without difficulty, not getting lost in familiar places. She returns for reevaluation. She gets very little exercise due to recent knee replacement    REVIEW OF SYSTEMS: Full 14 system review of systems performed and notable only for those listed, all others are neg:  Constitutional: neg  Cardiovascular: neg Ear/Nose/Throat: neg  Skin: neg Eyes: neg Respiratory: neg Gastroitestinal: neg  Hematology/Lymphatic: neg  Endocrine: neg Musculoskeletal: Joint pain muscle cramps, walking difficulty Allergy/Immunology: Food allergies Neurological: Memory loss, speech difficulty Psychiatric: Depression and anxiety Sleep : neg   ALLERGIES: Allergies  Allergen Reactions  . Shellfish Allergy Shortness Of Breath  . Iodine     Iodine contrast  . Aspirin Nausea And Vomiting  . Codeine Nausea And Vomiting    HOME MEDICATIONS: Outpatient Prescriptions Prior to Visit  Medication Sig Dispense Refill  . acetaminophen (TYLENOL) 325 MG tablet Take 2 tablets (650 mg total) by mouth every 6 (six) hours as needed for mild pain (or Fever >/= 101).    Marland Kitchen buPROPion (WELLBUTRIN XL) 150 MG 24 hr  tablet Take 450 mg by mouth daily.    . calcium carbonate (OS-CAL) 600 MG TABS tablet Take 600 mg by mouth daily with breakfast.    . cholecalciferol (VITAMIN  D) 1000 UNITS tablet Take 1,000 Units by mouth daily.    . Coenzyme Q10 200 MG capsule Take 200 mg by mouth daily.    . furosemide (LASIX) 40 MG tablet Take one (1) tablet (40 mg total) by mouth every morning and and half (1/2) tablet (20 mg total) by mouth every evening.    Marland Kitchen HYDROcodone-acetaminophen (NORCO/VICODIN) 5-325 MG per tablet Take 1-2 tablets by mouth every 6 (six) hours as needed for moderate pain.    Marland Kitchen levothyroxine (SYNTHROID, LEVOTHROID) 100 MCG tablet Take 100 mcg by mouth daily before breakfast.    . Magnesium 400 MG CAPS Take 400 mg by mouth daily.    . metoprolol tartrate (LOPRESSOR) 25 MG tablet Take one (1) tablet (25 mg total) by mouth every morning and take half (1/2) tablet (12.5 mg total) by mouth every evening. 60 tablet 2  . olmesartan (BENICAR) 40 MG tablet Take 1 tablet (40 mg total) by mouth every morning. For blood pressure 90 tablet 3  . Omega-3 Fatty Acids (FISH OIL) 1200 MG CAPS Take 1,200 mg by mouth every morning.    Marland Kitchen omeprazole (PRILOSEC) 40 MG capsule Take one tablet by mouth once daily for stomach 30 capsule 5  . Oxcarbazepine (TRILEPTAL) 300 MG tablet Take 300 mg by mouth every morning.     . polyethylene glycol (MIRALAX / GLYCOLAX) packet Take 17 g by mouth 2 (two) times daily. 14 each 0  . potassium chloride (K-DUR) 10 MEQ tablet Take 1 tablet (10 mEq total) by mouth daily. 90 tablet 3  . sertraline (ZOLOFT) 100 MG tablet Take 200 mg by mouth every morning.     Marland Kitchen spironolactone (ALDACTONE) 25 MG tablet Take 1 tablet (25 mg total) by mouth every morning. 90 tablet 3  . tiZANidine (ZANAFLEX) 4 MG tablet Take 1 tablet (4 mg total) by mouth every 6 (six) hours as needed for muscle spasms. 40 tablet 0  . traMADol (ULTRAM) 50 MG tablet Take 50-100 mg by mouth every 6 (six) hours as needed for moderate pain or severe pain.    . vitamin E 1000 UNIT capsule Take 1,000 Units by mouth daily.    . rivaroxaban (XARELTO) 10 MG TABS tablet Take 1 tablet (10 mg total) by  mouth daily. (Patient not taking: Reported on 01/20/2015) 14 tablet 0   No facility-administered medications prior to visit.    PAST MEDICAL HISTORY: Past Medical History  Diagnosis Date  . Syncope   . Systolic CHF ([place])   . Depression   . ICD (implantable cardiac defibrillator), biventricular, in situ   . LBBB (left bundle branch block)   . Dyslipidemia   . CHF (congestive heart failure) ([place])     PACEMAKER & DEFIB  . Fainted 04/21/06    AT [place]  . Hypertension   . Arthritis   . Wears glasses   . Headache(784.0)   . GERD (gastroesophageal reflux disease)   . HLD (hyperlipidemia)   . Hypothyroidism   . [facility])     left breast cancer   . ICD (implantable cardiac defibrillator) in place     pt has pacer/icd  . Pacemaker     Guidant Device  . Complication of anesthesia     hypotensive after back surgery in 2006- reports on chart   .  Nonischemic cardiomyopathy ([place])   . Normal coronary arteries     s/p cardiac cath 2007  . Memory loss   . Hearing loss   . Vertigo     PAST SURGICAL HISTORY: Past Surgical History  Procedure Laterality Date  . Total knee arthroplasty  05/17/01    RIGHT KNEE  . Lumbar fusion  2006  . Mastectomy, partial  2008    GOT PACEMAKER AND DEFIB AT THAT TIME  . Breast surgery  2000    LUMP REMOVAL. STAGE 1 CANCER  . Joint replacement  06/14/01    right  . Pacemaker insertion  04/23/06  . Back surgery      lumbar fusion   . Cardiac catheterization    . Mass excision  11/08/2011    Procedure: EXCISION MASS;  Surgeon: [name], MD;  Location: [facility] ORS;  Service: General;  Laterality: Left;  Excision Left Thigh Mass  . Implantable cardioverter defibrillator generator change N/A 12/18/2012    Procedure: IMPLANTABLE CARDIOVERTER DEFIBRILLATOR GENERATOR CHANGE;  Surgeon: [name], MD;  Location: [facility] CATH LAB;  Service: Cardiovascular;  Laterality: N/A;  . Cataract extraction    . Eye surgery    . Total knee arthroplasty Left 11/29/2014     Procedure: TOTAL LEFT KNEE ARTHROPLASTY;  Surgeon: Paralee Cancel, MD;  Location: [facility] ORS;  Service: Orthopedics;  Laterality: Left;    FAMILY HISTORY: Family History  Problem Relation Age of Onset  . Hypertension Mother   . Hypertension Father   . Hypertension Brother   . Hypertension Brother   . Arthritis Mother     SOCIAL HISTORY: Social History   Social History  . Marital Status: Married    Spouse Name: N/A  . Number of Children: 1  . Years of Education: Masters   Occupational History  . Retired    Social History Main Topics  . Smoking status: Never Smoker   . Smokeless tobacco: Never Used  . Alcohol Use: No  . Drug Use: No  . Sexual Activity: Not Currently    Birth Control/ Protection: Post-menopausal   Other Topics Concern  . Not on file   Social History Narrative   Lives at home with husband.   Right-handed.      As of 07/28/2014   Diet: No special diet   Caffeine: yes, Chocolate, tea and sodas    Married: YES, 1970   House: Yes, 2 stories, 2-3 persons live in home   Pets: No   Current/Past profession: Engineer, mining, Designer, jewellery    Exercise: Yes 2-3 x weekly   Living Will: Yes   DNR: No   POA/HPOA: No           PHYSICAL EXAM  Filed Vitals:   01/20/15 1026  BP: 109/66  Pulse: 79  Height: 4\' 11"  (1.499 m)  Weight: 130 lb 6.4 oz (59.149 kg)   Body mass index is 26.32 kg/(m^2).  Generalized: Well developed, in no acute distress  Head: normocephalic and atraumatic,. Oropharynx benign  Neck: Supple, no carotid bruits  Cardiac: Regular rate rhythm, no murmur  Musculoskeletal: No deformity   Neurological examination   Mentation: Alert oriented to time, place, history taking. Attention span and concentration appropriate. MMSE 28/4missing 1 of 3 recall and the date. AFT 13Clock drawing 4/4.Follows all commands speech and language fluent.   Cranial nerve II-XII: Pupils were equal round reactive to light extraocular movements were full,  visual field were full on confrontational test. Facial sensation and strength  were normal. hearing was intact to finger rubbing bilaterally. Uvula tongue midline. head turning and shoulder shrug were normal and symmetric.Tongue protrusion into cheek strength was normal. Motor: normal bulk and tone, full strength in the BUE, BLE, fine finger movements normal, no pronator drift. No focal weakness Sensory: normal and symmetric to light touch, pinprick, and  Vibration, proprioception  Coordination: finger-nose-finger, heel-to-shin bilaterally, no dysmetria Reflexes: Brachioradialis 2/2, biceps 2/2, triceps 2/2, patellar 2/2, Achilles 2/2, plantar responses were flexor bilaterally. Gait and Station: Rising up from seated position without assistance, cautious gait recent knee replacement DIAGNOSTIC DATA (LABS, IMAGING, TESTING) - I reviewed patient records, labs, notes, testing and imaging myself where available.  Lab Results  Component Value Date   WBC 14.3* 12/01/2014   HGB 10.2* 12/01/2014   HCT 31.5* 12/01/2014   MCV 86.8 12/01/2014   PLT 113* 12/01/2014      Component Value Date/Time   NA 136 12/01/2014 0446   NA 143 07/28/2014 1440   K 3.6 12/01/2014 0446   CL 102 12/01/2014 0446   CO2 27 12/01/2014 0446   GLUCOSE 112* 12/01/2014 0446   GLUCOSE 51* 07/28/2014 1440   BUN 13 12/01/2014 0446   BUN 28* 07/28/2014 1440   CREATININE 0.60 12/01/2014 0446   CALCIUM 8.9 12/01/2014 0446   PROT 7.0 07/28/2014 1440   PROT 7.1 01/02/2013 1040   ALBUMIN 4.5 07/28/2014 1440   ALBUMIN 3.5 01/02/2013 1040   AST 15 07/28/2014 1440   ALT 26 07/28/2014 1440   ALKPHOS 73 07/28/2014 1440   BILITOT <0.2 07/28/2014 1440   BILITOT 0.2* 01/02/2013 1040   GFRNONAA >60 12/01/2014 0446   GFRAA >60 12/01/2014 0446   Lab Results  Component Value Date   CHOL 178 11/26/2014   HDL 64 11/26/2014   LDLCALC 85 11/26/2014   TRIG 147 11/26/2014   CHOLHDL 2.8 11/26/2014   No results found for: HGBA1C Lab  Results  Component Value Date   VITAMINB12 1117* 07/28/2014   Lab Results  Component Value Date   TSH 2.210 07/28/2014      ASSESSMENT AND PLAN  81 y.o. year old female  has a past medical history of gradual onset of memory loss and history of depression and anxiety and  polypharmacy treatment. CAT scan shows mild atrophy small vessel disease no acute lesions. Her memory score is stable   Exercise by walking daily for overall health and well-being Follow-up in 6 months for repeat memory testing [name], [facility], [facility], APRN  [facility] [address] [place], [place] [address] [phone]

## 2015-01-20 NOTE — Patient Instructions (Signed)
Memory score is stable Exercises by walking daily for overall health and well-being Follow-up in 6 months

## 2015-01-21 ENCOUNTER — Ambulatory Visit: Payer: Medicare Other | Admitting: Neurology

## 2015-01-21 NOTE — Progress Notes (Signed)
I have reviewed and agreed above plan. 

## 2015-01-26 ENCOUNTER — Other Ambulatory Visit: Payer: Self-pay | Admitting: Internal Medicine

## 2015-02-03 ENCOUNTER — Ambulatory Visit (INDEPENDENT_AMBULATORY_CARE_PROVIDER_SITE_OTHER): Payer: Medicare Other | Admitting: *Deleted

## 2015-02-03 DIAGNOSIS — I428 Other cardiomyopathies: Secondary | ICD-10-CM

## 2015-02-03 DIAGNOSIS — I429 Cardiomyopathy, unspecified: Secondary | ICD-10-CM

## 2015-02-03 NOTE — Progress Notes (Signed)
Remote ICD transmission.   

## 2015-02-04 ENCOUNTER — Telehealth: Payer: Self-pay

## 2015-02-04 DIAGNOSIS — Z853 Personal history of malignant neoplasm of breast: Secondary | ICD-10-CM

## 2015-02-04 NOTE — Telephone Encounter (Signed)
[place] for b/l diagnostic mammogram

## 2015-02-04 NOTE — Telephone Encounter (Signed)
Patient tried to make appt for her mammogram at [facility] and they wouldn't, told her she needed Dr Eulas Post to order Diagnostic mammogram . [name] because of her history of breast cancer, they have to do diagnostic mammogram. . Route to Dr. Eulas Post

## 2015-02-09 NOTE — Telephone Encounter (Signed)
Called patient to let her know that we will be making an appointment for her diagnostic mammogram at [facility] for her. I'll put the order in and Earlie Server will schedule it and call you back with date and time

## 2015-02-17 LAB — CUP PACEART REMOTE DEVICE CHECK
Battery Remaining Longevity: 96 mo
Battery Remaining Percentage: 100 %
Brady Statistic RA Percent Paced: 0 %
Brady Statistic RV Percent Paced: 95 %
Date Time Interrogation Session: 20161027143900
HighPow Impedance: 48 Ohm
Implantable Lead Implant Date: 20080116
Implantable Lead Implant Date: 20080116
Implantable Lead Implant Date: 20080116
Implantable Lead Location: 753859
Implantable Lead Location: 753860
Implantable Lead Location: 753860
Implantable Lead Model: 157
Implantable Lead Model: 4469
Implantable Lead Model: 4555
Implantable Lead Serial Number: 136532
Implantable Lead Serial Number: 161542
Implantable Lead Serial Number: 473495
Lead Channel Impedance Value: 418 Ohm
Lead Channel Impedance Value: 562 Ohm
Lead Channel Impedance Value: 731 Ohm
Lead Channel Pacing Threshold Amplitude: 0.7 V
Lead Channel Pacing Threshold Amplitude: 0.7 V
Lead Channel Pacing Threshold Amplitude: 1 V
Lead Channel Pacing Threshold Pulse Width: 0.4 ms
Lead Channel Pacing Threshold Pulse Width: 0.4 ms
Lead Channel Pacing Threshold Pulse Width: 0.8 ms
Lead Channel Setting Pacing Amplitude: 2 V
Lead Channel Setting Pacing Amplitude: 2 V
Lead Channel Setting Pacing Amplitude: 2.4 V
Lead Channel Setting Pacing Pulse Width: 0.4 ms
Lead Channel Setting Pacing Pulse Width: 0.8 ms
Lead Channel Setting Sensing Sensitivity: 0.6 mV
Lead Channel Setting Sensing Sensitivity: 1 mV
Pulse Gen Serial Number: 111765

## 2015-02-18 ENCOUNTER — Encounter: Payer: Self-pay | Admitting: Cardiology

## 2015-02-21 ENCOUNTER — Telehealth: Payer: Self-pay | Admitting: *Deleted

## 2015-02-21 NOTE — Telephone Encounter (Signed)
Received fax order from [facility] for Diagnostic Mammogram with Ultrasound if necessary for History of Breast Cancer. Stamped and faxed back to [place] Fax: [phone]

## 2015-03-08 ENCOUNTER — Encounter: Payer: Self-pay | Admitting: Cardiology

## 2015-03-12 ENCOUNTER — Other Ambulatory Visit: Payer: Self-pay | Admitting: Internal Medicine

## 2015-03-21 ENCOUNTER — Other Ambulatory Visit: Payer: Self-pay | Admitting: *Deleted

## 2015-03-21 MED ORDER — FUROSEMIDE 40 MG PO TABS: ORAL_TABLET | ORAL | Status: DC

## 2015-03-21 NOTE — Telephone Encounter (Signed)
[facility] 

## 2015-03-28 ENCOUNTER — Other Ambulatory Visit: Payer: Self-pay | Admitting: Internal Medicine

## 2015-04-13 ENCOUNTER — Ambulatory Visit (INDEPENDENT_AMBULATORY_CARE_PROVIDER_SITE_OTHER): Payer: Medicare Other | Admitting: Internal Medicine

## 2015-04-13 ENCOUNTER — Encounter: Payer: Self-pay | Admitting: Internal Medicine

## 2015-04-13 VITALS — BP 112/60 | HR 66 | Temp 97.9°F | Resp 20 | Ht 59.0 in | Wt 129.0 lb

## 2015-04-13 DIAGNOSIS — F329 Major depressive disorder, single episode, unspecified: Secondary | ICD-10-CM | POA: Diagnosis not present

## 2015-04-13 DIAGNOSIS — G250 Essential tremor: Secondary | ICD-10-CM | POA: Diagnosis not present

## 2015-04-13 DIAGNOSIS — R413 Other amnesia: Secondary | ICD-10-CM | POA: Diagnosis not present

## 2015-04-13 DIAGNOSIS — I5022 Chronic systolic (congestive) heart failure: Secondary | ICD-10-CM | POA: Diagnosis not present

## 2015-04-13 DIAGNOSIS — E038 Other specified hypothyroidism: Secondary | ICD-10-CM

## 2015-04-13 DIAGNOSIS — R634 Abnormal weight loss: Secondary | ICD-10-CM | POA: Diagnosis not present

## 2015-04-13 DIAGNOSIS — Z95810 Presence of automatic (implantable) cardiac defibrillator: Secondary | ICD-10-CM

## 2015-04-13 DIAGNOSIS — D1720 Benign lipomatous neoplasm of skin and subcutaneous tissue of unspecified limb: Secondary | ICD-10-CM

## 2015-04-13 DIAGNOSIS — Z96652 Presence of left artificial knee joint: Secondary | ICD-10-CM | POA: Diagnosis not present

## 2015-04-13 DIAGNOSIS — D1739 Benign lipomatous neoplasm of skin and subcutaneous tissue of other sites: Secondary | ICD-10-CM | POA: Diagnosis not present

## 2015-04-13 DIAGNOSIS — I10 Essential (primary) hypertension: Secondary | ICD-10-CM | POA: Diagnosis not present

## 2015-04-13 DIAGNOSIS — F32A Depression, unspecified: Secondary | ICD-10-CM

## 2015-04-13 DIAGNOSIS — E034 Atrophy of thyroid (acquired): Secondary | ICD-10-CM

## 2015-04-13 MED ORDER — DONEPEZIL HCL 5 MG PO TABS: 5.0000 mg | ORAL_TABLET | Freq: Every day | ORAL | Status: DC

## 2015-04-13 NOTE — Patient Instructions (Signed)
Start aricept for memory loss  Continue other medications as ordered   Follow up with specialists as scheduled  Will call with lab results and referral appt  Follow up in 1 month for memory loss  Donepezil (Aricept) tablets What is this medicine? DONEPEZIL (doe NEP e zil) is used to treat mild to moderate dementia caused by Alzheimer's disease. This medicine may be used for other purposes; ask your health care provider or pharmacist if you have questions. What should I tell my health care provider before I take this medicine? They need to know if you have any of these conditions: -asthma or other lung disease -difficulty passing urine -head injury -heart disease -history of irregular heartbeat -liver disease -seizures (convulsions) -stomach or intestinal disease, ulcers or stomach bleeding -an unusual or allergic reaction to donepezil, other medicines, foods, dyes, or preservatives -pregnant or trying to get pregnant -breast-feeding How should I use this medicine? Take this medicine by mouth with a glass of water. Follow the directions on the prescription label. You may take this medicine with or without food. Take this medicine at regular intervals. This medicine is usually taken before bedtime. Do not take it more often than directed. Continue to take your medicine even if you feel better. Do not stop taking except on your doctor's advice. If you are taking the 23 mg donepezil tablet, swallow it whole; do not cut, crush, or chew it. Talk to your pediatrician regarding the use of this medicine in children. Special care may be needed. Overdosage: If you think you have taken too much of this medicine contact a poison control center or emergency room at once. NOTE: This medicine is only for you. Do not share this medicine with others. What if I miss a dose? If you miss a dose, take it as soon as you can. If it is almost time for your next dose, take only that dose, do not take double  or extra doses. What may interact with this medicine? Do not take this medicine with any of the following medications: -certain medicines for fungal infections like itraconazole, fluconazole, posaconazole, and voriconazole -cisapride -dextromethorphan; quinidine -dofetilide -dronedarone -pimozide -quinidine -thioridazine -ziprasidone This medicine may also interact with the following medications: -antihistamines for allergy, cough and cold -atropine -bethanechol -carbamazepine -certain medicines for bladder problems like oxybutynin, tolterodine -certain medicines for Parkinson's disease like benztropine, trihexyphenidyl -certain medicines for stomach problems like dicyclomine, hyoscyamine -certain medicines for travel sickness like scopolamine -dexamethasone -ipratropium -NSAIDs, medicines for pain and inflammation, like ibuprofen or naproxen -other medicines for Alzheimer's disease -other medicines that prolong the QT interval (cause an abnormal heart rhythm) -phenobarbital -phenytoin -rifampin, rifabutin or rifapentine This list may not describe all possible interactions. Give your health care provider a list of all the medicines, herbs, non-prescription drugs, or dietary supplements you use. Also tell them if you smoke, drink alcohol, or use illegal drugs. Some items may interact with your medicine. What should I watch for while using this medicine? Visit your doctor or health care professional for regular checks on your progress. Check with your doctor or health care professional if your symptoms do not get better or if they get worse. You may get drowsy or dizzy. Do not drive, use machinery, or do anything that needs mental alertness until you know how this drug affects you. What side effects may I notice from receiving this medicine? Side effects that you should report to your doctor or health care professional as soon as possible: -allergic reactions  like skin rash, itching  or hives, swelling of the face, lips, or tongue -changes in vision -feeling faint or lightheaded, falls -problems with balance -redness, blistering, peeling or loosening of the skin, including inside the mouth -slow heartbeat, or palpitations -stomach pain -unusual bleeding or bruising, red or purple spots on the skin -vomiting -weight loss Side effects that usually do not require medical attention (report to your doctor or health care professional if they continue or are bothersome): -diarrhea, especially when starting treatment -headache -indigestion or heartburn -loss of appetite -muscle cramps -nausea This list may not describe all possible side effects. Call your doctor for medical advice about side effects. You may report side effects to FDA at 1-800-FDA-1088. Where should I keep my medicine? Keep out of reach of children. Store at room temperature between 15 and 30 degrees C (59 and 86 degrees F). Throw away any unused medicine after the expiration date. NOTE: This sheet is a summary. It may not cover all possible information. If you have questions about this medicine, talk to your doctor, pharmacist, or health care provider.    2016, Elsevier/Gold Standard. (2013-11-05 07:51:52)

## 2015-04-13 NOTE — Progress Notes (Signed)
Patient ID: [name], female   DOB: [birth year], 82 y.o.   MRN: [id number]    Location:    [facility]   Place of Service:  OFFICE   Chief Complaint  Patient presents with  . Medical Management of Chronic Issues    4 month follow-up for CHF, Hypertension, Hypothyroidism    HPI:  82 yo female seen today for f/u. She is c/a memory loss. She easily forgets sentence content while speaking with someone. Last MMSE 27/30 in April 2016. She is interested in medication to help.  She is c/a left thigh scar at site of lipoma excision a few yrs ago by Dr Barry Dienes. She believes it is coming back and would like the area re-evaluated.   She has chronic pain in left knee and has had a right TKR. She has pain in both knees intermittently. Takes Tylenol prn. She sees Dr Alvan Dame for ortho care. She is s/p left TKR in Aug 2016. no post op complication. She has completed tx and released from Ortho   She has good and bad days for depression. Weight has been stable. She sees Dr Reece Levy for psych. Currently taking sertraline, trileptal and wellbutrin xl. She has mentioned her weight loss to her mental health provider  She c/o occasional easy bruising x 1 year. She has ASA sensitivity and does not take med. She is taking some meds that will cause it.  Chronic systolic HF/AICD/LBBB/NICM - followed by cardiology  Hyperlipidemia - diet controlled Thyroid stable on levothyroxine  She is a poor historian due to memory loss. Hx obtained from chart.   Past Medical History  Diagnosis Date  . Syncope   . Systolic CHF ([place])   . Depression   . ICD (implantable cardiac defibrillator), biventricular, in situ   . LBBB (left bundle branch block)   . Dyslipidemia   . CHF (congestive heart failure) ([place])     PACEMAKER & DEFIB  . Fainted 04/21/06    AT [place]  . Hypertension   . Arthritis   . Wears glasses   . Headache(784.0)   . GERD (gastroesophageal reflux disease)   . HLD (hyperlipidemia)   . Hypothyroidism   .  Cancer [facility])     left breast cancer   . ICD (implantable cardiac defibrillator) in place     pt has pacer/icd  . Pacemaker     Guidant Device  . Complication of anesthesia     hypotensive after back surgery in 2006- reports on chart   . Nonischemic cardiomyopathy ([place])   . Normal coronary arteries     s/p cardiac cath 2007  . Memory loss   . Hearing loss   . Vertigo     Past Surgical History  Procedure Laterality Date  . Total knee arthroplasty  05/17/01    RIGHT KNEE  . Lumbar fusion  2006  . Mastectomy, partial  2008    GOT PACEMAKER AND DEFIB AT THAT TIME  . Breast surgery  2000    LUMP REMOVAL. STAGE 1 CANCER  . Joint replacement  06/14/01    right  . Pacemaker insertion  04/23/06  . Back surgery      lumbar fusion   . Cardiac catheterization    . Mass excision  11/08/2011    Procedure: EXCISION MASS;  Surgeon: [name], MD;  Location: [facility];  Service: General;  Laterality: Left;  Excision Left Thigh Mass  . Implantable cardioverter defibrillator generator change N/A 12/18/2012    Procedure:  IMPLANTABLE CARDIOVERTER DEFIBRILLATOR GENERATOR CHANGE;  Surgeon: [name], MD;  Location: [facility] CATH LAB;  Service: Cardiovascular;  Laterality: N/A;  . Cataract extraction    . Eye surgery    . Total knee arthroplasty Left 11/29/2014    Procedure: TOTAL LEFT KNEE ARTHROPLASTY;  Surgeon: Paralee Cancel, MD;  Location: [facility];  Service: Orthopedics;  Laterality: Left;    Patient Care Team: [name], DO as PCP - General (Internal Medicine) [name], MD as Consulting Physician (General Surgery) Paralee Cancel, MD as Consulting Physician (Orthopedic Surgery) Marica Otter, [place] (Optometry) Marcial Pacas, MD as Consulting Physician (Neurology) [name], MD as Consulting Physician (Cardiology)  Social History   Social History  . Marital Status: Married    Spouse Name: N/A  . Number of Children: 1  . Years of Education: Masters   Occupational History  . Retired     Social History Main Topics  . Smoking status: Never Smoker   . Smokeless tobacco: Never Used  . Alcohol Use: No  . Drug Use: No  . Sexual Activity: Not Currently    Birth Control/ Protection: Post-menopausal   Other Topics Concern  . Not on file   Social History Narrative   Lives at home with husband.   Right-handed.      As of 07/28/2014   Diet: No special diet   Caffeine: yes, Chocolate, tea and sodas    Married: YES, 1970   House: Yes, 2 stories, 2-3 persons live in home   Pets: No   Current/Past profession: Engineer, mining, Designer, jewellery    Exercise: Yes 2-3 x weekly   Living Will: Yes   DNR: No   POA/HPOA: No           reports that she has never smoked. She has never used smokeless tobacco. She reports that she does not drink alcohol or use illicit drugs.  Allergies  Allergen Reactions  . Shellfish Allergy Shortness Of Breath  . Iodine     Iodine contrast  . Aspirin Nausea And Vomiting  . Codeine Nausea And Vomiting    Medications: Patient's Medications  New Prescriptions   No medications on file  Previous Medications   ACETAMINOPHEN (TYLENOL) 325 MG TABLET    Take 2 tablets (650 mg total) by mouth every 6 (six) hours as needed for mild pain (or Fever >/= 101).   BUPROPION (WELLBUTRIN XL) 150 MG 24 HR TABLET    Take 450 mg by mouth daily.   CALCIUM CARBONATE (OS-CAL) 600 MG TABS TABLET    Take 600 mg by mouth daily with breakfast.   CHOLECALCIFEROL (VITAMIN D) 1000 UNITS TABLET    Take 1,000 Units by mouth daily.   COENZYME Q10 200 MG CAPSULE    Take 200 mg by mouth daily.   FUROSEMIDE (LASIX) 40 MG TABLET    Take one (1) tablet (40 mg total) by mouth every morning and and half (1/2) tablet (20 mg total) by mouth every evening.   FUROSEMIDE (LASIX) 40 MG TABLET    Take one tablet by mouth in the morning and take 1/2 tablet by mouth at night   LEVOTHYROXINE (SYNTHROID, LEVOTHROID) 100 MCG TABLET    Take 100 mcg by mouth daily before breakfast.    MAGNESIUM 400 MG CAPS    Take 400 mg by mouth daily.   METOPROLOL TARTRATE (LOPRESSOR) 25 MG TABLET    Take 1 tablet (25 mg total) by mouth every morning and  take half (1/2)  tablet  (12.5 mg total) by mouth  every evening.   OLMESARTAN (BENICAR) 40 MG TABLET    Take 1 tablet (40 mg total) by mouth every morning. For blood pressure   OMEGA-3 FATTY ACIDS (FISH OIL) 1200 MG CAPS    Take 1,200 mg by mouth every morning.   OMEPRAZOLE (PRILOSEC) 40 MG CAPSULE    Take 1 capsule by mouth  daily for stomach   OXCARBAZEPINE (TRILEPTAL) 300 MG TABLET    Take 300 mg by mouth every morning.    POTASSIUM CHLORIDE (K-DUR) 10 MEQ TABLET    Take 1 tablet (10 mEq total) by mouth daily.   SERTRALINE (ZOLOFT) 100 MG TABLET    Take 200 mg by mouth every morning.    SPIRONOLACTONE (ALDACTONE) 25 MG TABLET    Take 1 tablet (25 mg total) by mouth every morning.   VITAMIN E 1000 UNIT CAPSULE    Take 1,000 Units by mouth daily.  Modified Medications   No medications on file  Discontinued Medications   HYDROCODONE-ACETAMINOPHEN (NORCO/VICODIN) 5-325 MG PER TABLET    Take 1-2 tablets by mouth every 6 (six) hours as needed for moderate pain.   POLYETHYLENE GLYCOL (MIRALAX / GLYCOLAX) PACKET    Take 17 g by mouth 2 (two) times daily.   TIZANIDINE (ZANAFLEX) 4 MG TABLET    Take 1 tablet (4 mg total) by mouth every 6 (six) hours as needed for muscle spasms.   TRAMADOL (ULTRAM) 50 MG TABLET    Take 50-100 mg by mouth every 6 (six) hours as needed for moderate pain or severe pain.    Review of Systems  Unable to perform ROS: Dementia    Filed Vitals:   04/13/15 1033  BP: 112/60  Pulse: 66  Temp: 97.9 F (36.6 C)  TempSrc: Oral  Resp: 20  Height: 4\' 11"  (1.499 m)  Weight: 129 lb (58.514 kg)  SpO2: 96%   Body mass index is 26.04 kg/(m^2).  Physical Exam  Constitutional: She appears well-developed and well-nourished.  HENT:  Mouth/Throat: Oropharynx is clear and moist. No oropharyngeal exudate.  Eyes: Pupils  are equal, round, and reactive to light. No scleral icterus.  Neck: Neck supple. Carotid bruit is not present. No tracheal deviation present. No thyromegaly present.  Cardiovascular: Normal rate, regular rhythm, normal heart sounds and intact distal pulses.  Exam reveals no gallop and no friction rub.   No murmur heard. No LE edema b/l. no calf TTP.   Pulmonary/Chest: Effort normal and breath sounds normal. No stridor. No respiratory distress. She has no wheezes. She has no rales.  Abdominal: Soft. Bowel sounds are normal. She exhibits no distension and no mass. There is no hepatomegaly. There is no tenderness. There is no rebound and no guarding.  Musculoskeletal: She exhibits edema and tenderness.  Gait antalgic  Lymphadenopathy:    She has no cervical adenopathy.  Neurological: She is alert.  Skin: Skin is warm and dry. No rash noted.     Psychiatric: She has a normal mood and affect. Her behavior is normal.     Labs reviewed: Clinical Support on 02/03/2015  Component Date Value Ref Range Status  . Date Time Interrogation Session 02/03/2015 [id number]   Final  . Pulse Generator Manufacturer 02/03/2015 BOST   Final  . Pulse Gen Model 02/03/2015 N141 ENERGEN CRT-D   Final  . Pulse Gen Serial Number 02/03/2015 [id number]   Final  . Implantable Pulse Generator Type 02/03/2015 Cardiac Resynch Therapy Defibulator   Final  .  Implantable Pulse Generator Implan* 02/03/2015 [id number]   Final  . Implantable Lead Manufacturer 02/03/2015 GUIC   Final  . Implantable Lead Model 02/03/2015 0157   Final  . Implantable Lead Serial Number 02/03/2015 [id number]   Final  . Implantable Lead Implant Date 02/03/2015 [id number]   Final  . Implantable Lead Location 02/03/2015 [id number]   Final  . Implantable Lead Manufacturer 02/03/2015 GUIC   Final  . Implantable Lead Model 02/03/2015 4469   Final  . Implantable Lead Serial Number 02/03/2015 [id number]   Final  . Implantable Lead Implant Date 02/03/2015  [id number]   Final  . Implantable Lead Location 02/03/2015 [id number]   Final  . Implantable Lead Manufacturer 02/03/2015 GUIC   Final  . Implantable Lead Model 02/03/2015 4555   Final  . Implantable Lead Serial Number 02/03/2015 [id number]   Final  . Implantable Lead Implant Date 02/03/2015 [id number]   Final  . Implantable Lead Location 02/03/2015 [id number]   Final  . Lead Channel Setting Sensing Sensi* 02/03/2015 0.6   Final  . Lead Channel Setting Sensing Adapt* 02/03/2015 Adaptive Sensing   Final  . Lead Channel Setting Sensing Sensi* 02/03/2015 1.0   Final  . Lead Channel Setting Sensing Adapt* 02/03/2015 Adaptive Sensing   Final  . Lead Channel Setting Pacing Amplit* 02/03/2015 2.0   Final  . Lead Channel Setting Pacing Pulse * 02/03/2015 0.4   Final  . Lead Channel Setting Pacing Amplit* 02/03/2015 2.0   Final  . Lead Channel Setting Pacing Pulse * 02/03/2015 0.8   Final  . Lead Channel Setting Pacing Amplit* 02/03/2015 2.4   Final  . Lead Channel Impedance Value 02/03/2015 731   Final  . Lead Channel Pacing Threshold Ampl* 02/03/2015 1.0   Final  . Lead Channel Pacing Threshold Puls* 02/03/2015 0.8   Final  . Lead Channel Impedance Value 02/03/2015 418   Final  . Lead Channel Pacing Threshold Ampl* 02/03/2015 0.7   Final  . Lead Channel Pacing Threshold Puls* 02/03/2015 0.4   Final  . Lead Channel Impedance Value 02/03/2015 562   Final  . Lead Channel Pacing Threshold Ampl* 02/03/2015 0.7   Final  . Lead Channel Pacing Threshold Puls* 02/03/2015 0.4   Final  . HighPow Impedance 02/03/2015 48   Final  . Battery Status 02/03/2015 BOS   Final  . Battery Remaining Longevity 02/03/2015 96   Final  . Battery Remaining Percentage 02/03/2015 100   Final  . Loletha Grayer Statistic RA Percent Paced 02/03/2015 0   Final  . Loletha Grayer Statistic RV Percent Paced 02/03/2015 95   Final  . Eval Rhythm 02/03/2015 AsBp   Final    No results found.   Assessment/Plan   ICD-9-CM ICD-10-CM   1. Lipoma of left upper  thigh 3x5 cm- 214.1 D17.39 Ambulatory referral to General Surgery   s/p excision by Dr Barry Dienes in 2014  2. Memory loss - mild 780.93 R41.3 donepezil (ARICEPT) 5 MG tablet  3. S/P left TKA due to OA V43.65 Z96.652   4. Benign hypertension- stable  401.1 [id number] Basic Metabolic Panel     ALT     Lipid Panel  5. Chronic systolic heart failure (HCC) - stable 428.22 [id number] Basic Metabolic Panel     ALT     Lipid Panel  6. Hypothyroidism due to acquired atrophy of thyroid - stable 244.8 E03.8 TSH   246.8 E03.4   7. Essential tremor - stable 333.1 G25.0   8. ICD (implantable cardioverter-defibrillator), biventricular, in  situ due to #5 V45.02 Z95.810   9. Loss of weight - unchanged 783.21 R63.4   10. Depression - unchanged 311 F32.9 ALT    Start aricept 5mg  qhs. Side effects discussed  Continue other medications as ordered   Follow up with specialists as scheduled  Follow up in 1 month for memory loss  Sinead Hockman S. Perlie Gold  [facility] [address] [place],  [address] [phone] Cell (Monday-Friday 8 AM - 5 PM) [phone] After 5 PM and follow prompts

## 2015-04-14 LAB — BASIC METABOLIC PANEL
BUN/Creatinine Ratio: 28 — ABNORMAL HIGH (ref 11–26)
BUN: 24 mg/dL (ref 8–27)
CO2: 23 mmol/L (ref 18–29)
Calcium: 10.3 mg/dL (ref 8.7–10.3)
Chloride: 101 mmol/L (ref 96–106)
Creatinine, Ser: 0.87 mg/dL (ref 0.57–1.00)
GFR calc Af Amer: 73 mL/min/{1.73_m2} (ref >59)
GFR calc non Af Amer: 64 mL/min/{1.73_m2} (ref >59)
Glucose: 76 mg/dL (ref 65–99)
Potassium: 4.7 mmol/L (ref 3.5–5.2)
Sodium: 140 mmol/L (ref 134–144)

## 2015-04-14 LAB — LIPID PANEL
Chol/HDL Ratio: 3.4 ratio units (ref 0.0–4.4)
Cholesterol, Total: 190 mg/dL (ref 100–199)
HDL: 56 mg/dL (ref >39)
LDL Calculated: 101 mg/dL — ABNORMAL HIGH (ref 0–99)
Triglycerides: 164 mg/dL — ABNORMAL HIGH (ref 0–149)
VLDL Cholesterol Cal: 33 mg/dL (ref 5–40)

## 2015-04-14 LAB — TSH: TSH: 1.46 u[IU]/mL (ref 0.450–4.500)

## 2015-04-14 LAB — ALT: ALT: 26 IU/L (ref 0–32)

## 2015-04-28 ENCOUNTER — Other Ambulatory Visit: Payer: Self-pay | Admitting: *Deleted

## 2015-04-28 DIAGNOSIS — R413 Other amnesia: Secondary | ICD-10-CM

## 2015-04-28 MED ORDER — DONEPEZIL HCL 5 MG PO TABS: ORAL_TABLET | ORAL | Status: DC

## 2015-04-28 NOTE — Telephone Encounter (Signed)
[facility] 

## 2015-05-10 ENCOUNTER — Ambulatory Visit (INDEPENDENT_AMBULATORY_CARE_PROVIDER_SITE_OTHER): Payer: Medicare Other | Admitting: Internal Medicine

## 2015-05-10 ENCOUNTER — Encounter: Payer: Self-pay | Admitting: Internal Medicine

## 2015-05-10 VITALS — BP 132/82 | HR 64 | Temp 97.3°F | Resp 20 | Ht 59.0 in | Wt 127.7 lb

## 2015-05-10 VITALS — BP 130/78 | HR 62 | Ht 59.0 in | Wt 126.8 lb

## 2015-05-10 DIAGNOSIS — I10 Essential (primary) hypertension: Secondary | ICD-10-CM | POA: Diagnosis not present

## 2015-05-10 DIAGNOSIS — Z95810 Presence of automatic (implantable) cardiac defibrillator: Secondary | ICD-10-CM | POA: Diagnosis not present

## 2015-05-10 DIAGNOSIS — I5022 Chronic systolic (congestive) heart failure: Secondary | ICD-10-CM

## 2015-05-10 DIAGNOSIS — M25539 Pain in unspecified wrist: Secondary | ICD-10-CM | POA: Insufficient documentation

## 2015-05-10 DIAGNOSIS — M25532 Pain in left wrist: Secondary | ICD-10-CM | POA: Diagnosis not present

## 2015-05-10 LAB — CUP PACEART INCLINIC DEVICE CHECK
Brady Statistic RA Percent Paced: 1 % — CL
Brady Statistic RV Percent Paced: 94 %
Date Time Interrogation Session: 20170131050000
HighPow Impedance: 48 Ohm
Implantable Lead Implant Date: 20080116
Implantable Lead Implant Date: 20080116
Implantable Lead Implant Date: 20080116
Implantable Lead Location: 753859
Implantable Lead Location: 753860
Implantable Lead Location: 753860
Implantable Lead Model: 157
Implantable Lead Model: 4469
Implantable Lead Model: 4555
Implantable Lead Serial Number: 136532
Implantable Lead Serial Number: 161542
Implantable Lead Serial Number: 473495
Lead Channel Impedance Value: 430 Ohm
Lead Channel Impedance Value: 552 Ohm
Lead Channel Impedance Value: 775 Ohm
Lead Channel Pacing Threshold Amplitude: 0.7 V
Lead Channel Pacing Threshold Amplitude: 0.8 V
Lead Channel Pacing Threshold Amplitude: 1 V
Lead Channel Pacing Threshold Pulse Width: 0.4 ms
Lead Channel Pacing Threshold Pulse Width: 0.4 ms
Lead Channel Pacing Threshold Pulse Width: 0.8 ms
Lead Channel Sensing Intrinsic Amplitude: 19.8 mV
Lead Channel Sensing Intrinsic Amplitude: 25 mV
Lead Channel Sensing Intrinsic Amplitude: 4.9 mV
Lead Channel Setting Pacing Amplitude: 2 V
Lead Channel Setting Pacing Amplitude: 2 V
Lead Channel Setting Pacing Amplitude: 2.4 V
Lead Channel Setting Pacing Pulse Width: 0.4 ms
Lead Channel Setting Pacing Pulse Width: 0.8 ms
Lead Channel Setting Sensing Sensitivity: 0.6 mV
Lead Channel Setting Sensing Sensitivity: 1 mV
Pulse Gen Serial Number: 111765

## 2015-05-10 NOTE — Assessment & Plan Note (Signed)
Her blood pressure is well controlled. No change in meds. 

## 2015-05-10 NOTE — Progress Notes (Signed)
Patient ID: [name], female   DOB: [birth year], 82 y.o.   MRN: [id number]    Facility  [facility]    Place of Service:   OFFICE    Allergies  Allergen Reactions  . Shellfish Allergy Shortness Of Breath  . Iodine     Iodine contrast  . Aspirin Nausea And Vomiting  . Codeine Nausea And Vomiting    Chief Complaint  Patient presents with  . Acute Visit    [facility], bruise on the palm of left hand    HPI:  Patient fell in [facility] parking lot of the [facility] center this morning. She fell with an outstretched hand. She did not damage anything else, but as the days progressed, her left wrist has become more swollen. There is swelling and bruising on the palmar aspect and at the wrist. There is bruising on the dorsal aspect on the radial side. She is able to twisted back and forth and slightly move it up and down without a lot of pain. There are scars present on both wrists from previous carpal tunnel syndrome surgery.  Patient able to move all fingers. No pain at the elbow, shoulders, or neck. There is no evidence of bruising at the knees. Denies hip discomfort or back discomfort.  Medications: Patient's Medications  New Prescriptions   No medications on file  Previous Medications   ACETAMINOPHEN (TYLENOL) 325 MG TABLET    Take 2 tablets (650 mg total) by mouth every 6 (six) hours as needed for mild pain (or Fever >/= 101).   BUPROPION (WELLBUTRIN XL) 150 MG 24 HR TABLET    Take 450 mg by mouth daily.   CHOLECALCIFEROL (VITAMIN D) 1000 UNITS TABLET    Take 1,000 Units by mouth daily.   COENZYME Q10 200 MG CAPSULE    Take 200 mg by mouth daily.   DONEPEZIL (ARICEPT) 5 MG TABLET    Take one tablet by mouth at bedtime to preserve memory   FUROSEMIDE (LASIX) 40 MG TABLET    Take one (1) tablet (40 mg total) by mouth every morning and and half (1/2) tablet (20 mg total) by mouth every evening.   LEVOTHYROXINE (SYNTHROID, LEVOTHROID) 100 MCG TABLET    Take 100  mcg by mouth daily before breakfast.   MAGNESIUM 400 MG CAPS    Take 400 mg by mouth daily.   METOPROLOL TARTRATE (LOPRESSOR) 25 MG TABLET    Take 1 tablet (25 mg total) by mouth every morning and  take half (1/2) tablet  (12.5 mg total) by mouth  every evening.   OLMESARTAN (BENICAR) 40 MG TABLET    Take 1 tablet (40 mg total) by mouth every morning. For blood pressure   OMEGA-3 FATTY ACIDS (FISH OIL) 1200 MG CAPS    Take 1,200 mg by mouth every morning.   OMEPRAZOLE (PRILOSEC) 40 MG CAPSULE    Take 1 capsule by mouth  daily for stomach   OXCARBAZEPINE (TRILEPTAL) 300 MG TABLET    Take 300 mg by mouth every morning.    POTASSIUM CHLORIDE (K-DUR) 10 MEQ TABLET    Take 1 tablet (10 mEq total) by mouth daily.   SERTRALINE (ZOLOFT) 100 MG TABLET    Take 200 mg by mouth every morning.    SPIRONOLACTONE (ALDACTONE) 25 MG TABLET    Take 1 tablet (25 mg total) by mouth every morning.   VITAMIN E 1000 UNIT CAPSULE    Take 1,000 Units  by mouth daily.  Modified Medications   No medications on file  Discontinued Medications   No medications on file    Review of Systems  Constitutional: Negative for fever and chills.  HENT: Negative for sore throat and tinnitus.   Respiratory: Negative for cough, shortness of breath and wheezing.   Cardiovascular: Negative for chest pain, palpitations and leg swelling.  Gastrointestinal: Negative for nausea, vomiting, abdominal pain, diarrhea, constipation and blood in stool.  Genitourinary: Negative for dysuria, urgency, frequency and hematuria.       No urinary/bowel incontinence  Musculoskeletal: Positive for back pain and gait problem. Negative for myalgias.       Left wrist swelling, bruising on the dorsal and palmar aspect on the radial side, and tenderness to palpation.  Skin: Negative for rash.  Allergic/Immunologic: Negative for environmental allergies.  Neurological: Positive for tremors. Negative for dizziness, seizures, weakness and headaches.    Hematological: Does not bruise/bleed easily.  Psychiatric/Behavioral: The patient is not nervous/anxious.     Filed Vitals:   05/10/15 1633  BP: 132/82  Pulse: 64  Temp: 97.3 F (36.3 C)  TempSrc: Oral  Resp: 20  Height: '4\' 11"'$  (1.499 m)  Weight: 127 lb 11.2 oz (57.924 kg)  SpO2: 93%   Body mass index is 25.78 kg/(m^2). Filed Weights   05/10/15 1633  Weight: 127 lb 11.2 oz (57.924 kg)     Physical Exam  Constitutional: She appears well-developed and well-nourished.  HENT:  Mouth/Throat: Oropharynx is clear and moist. No oropharyngeal exudate.  Eyes: Pupils are equal, round, and reactive to light. No scleral icterus.  Neck: Neck supple. Carotid bruit is not present. No tracheal deviation present. No thyromegaly present.  Cardiovascular: Normal rate, regular rhythm, normal heart sounds and intact distal pulses.  Exam reveals no gallop and no friction rub.   No murmur heard. No LE edema b/l. no calf TTP.   Pulmonary/Chest: Effort normal and breath sounds normal. No stridor. No respiratory distress. She has no wheezes. She has no rales.  Abdominal: Soft. Bowel sounds are normal. She exhibits no distension and no mass. There is no hepatomegaly. There is no tenderness. There is no rebound and no guarding.  Musculoskeletal: She exhibits edema and tenderness.  Gait antalgic Left wrist swollen and bruised. Tenderness to palpation.  Lymphadenopathy:    She has no cervical adenopathy.  Neurological: She is alert.  Skin: Skin is warm and dry. No rash noted.     Psychiatric: She has a normal mood and affect. Her behavior is normal.    Labs reviewed: Lab Summary Latest Ref Rng 04/13/2015 12/01/2014 11/30/2014 11/26/2014 11/22/2014  Hemoglobin 12.0 - 15.0 g/dL (None) 10.2(L) 9.2(L) (None) 12.7  Hematocrit 36.0 - 46.0 % (None) 31.5(L) 28.0(L) (None) 38.9  White count 4.0 - 10.5 K/uL (None) 14.3(H) 10.9(H) (None) 9.4  Platelet count 150 - 400 K/uL (None) 113(L) 113(L) (None) 181   Sodium 134 - 144 mmol/L 140 136 135 (None) 136  Potassium 3.5 - 5.2 mmol/L 4.7 3.6 3.7 (None) 4.3  Calcium 8.7 - 10.3 mg/dL 10.3 8.9 9.1 (None) 10.4(H)  Phosphorus - (None) (None) (None) (None) (None)  Creatinine 0.57 - 1.00 mg/dL 0.87 0.60 0.67 (None) 0.85  AST - (None) (None) (None) (None) (None)  Alk Phos - (None) (None) (None) (None) (None)  Bilirubin - (None) (None) (None) (None) (None)  Glucose 65 - 99 mg/dL 76 112(H) 102(H) (None) 98  Cholesterol - (None) (None) (None) (None) (None)  HDL cholesterol >39 mg/dL 56 (None) (  None) 64 (None)  Triglycerides 0 - 149 mg/dL 164(H) (None) (None) 147 (None)  LDL Direct - (None) (None) (None) (None) (None)  LDL Calc 0 - 99 mg/dL 101(H) (None) (None) 85 (None)  Total protein - (None) (None) (None) (None) (None)  Albumin - (None) (None) (None) (None) (None)   Lab Results  Component Value Date   TSH 1.460 04/13/2015   TSH 2.210 07/28/2014   TSH 3.488 01/02/2010   Lab Results  Component Value Date   BUN 24 04/13/2015   BUN 13 12/01/2014   BUN 13 11/30/2014   No results found for: HGBA1C  Assessment/Plan  1. Left wrist pain Recommended wrist splint with Velcro closures. - DG Wrist Complete Left; Future

## 2015-05-10 NOTE — Progress Notes (Signed)
HPI [name] returns today for followup. She is a very pleasant 82 year old woman with chronic systolic heart failure, left bundle branch block, status post biventricular ICD insertion, 7 years ago who underwent device generator change out approximately  2 years ago. In the interim she has been stable. She denies chest pain or sob. She has class IIA heart failure symptoms. No syncope. No recent ICD shock. She has undergone knee surgery with a left sided knee replacement. Allergies  Allergen Reactions  . Shellfish Allergy Shortness Of Breath  . Iodine     Iodine contrast  . Aspirin Nausea And Vomiting  . Codeine Nausea And Vomiting     Current Outpatient Prescriptions  Medication Sig Dispense Refill  . acetaminophen (TYLENOL) 325 MG tablet Take 2 tablets (650 mg total) by mouth every 6 (six) hours as needed for mild pain (or Fever >/= 101).    Marland Kitchen buPROPion (WELLBUTRIN XL) 150 MG 24 hr tablet Take 450 mg by mouth daily.    . cholecalciferol (VITAMIN D) 1000 UNITS tablet Take 1,000 Units by mouth daily.    . Coenzyme Q10 200 MG capsule Take 200 mg by mouth daily.    Marland Kitchen donepezil (ARICEPT) 5 MG tablet Take one tablet by mouth at bedtime to preserve memory 90 tablet 3  . furosemide (LASIX) 40 MG tablet Take one (1) tablet (40 mg total) by mouth every morning and and half (1/2) tablet (20 mg total) by mouth every evening.    Marland Kitchen levothyroxine (SYNTHROID, LEVOTHROID) 100 MCG tablet Take 100 mcg by mouth daily before breakfast.    . Magnesium 400 MG CAPS Take 400 mg by mouth daily.    . metoprolol tartrate (LOPRESSOR) 25 MG tablet Take 1 tablet (25 mg total) by mouth every morning and  take half (1/2) tablet  (12.5 mg total) by mouth  every evening. 45 tablet 2  . olmesartan (BENICAR) 40 MG tablet Take 1 tablet (40 mg total) by mouth every morning. For blood pressure 90 tablet 3  . Omega-3 Fatty Acids (FISH OIL) 1200 MG CAPS Take 1,200 mg by mouth every morning.    Marland Kitchen omeprazole (PRILOSEC) 40 MG  capsule Take 1 capsule by mouth  daily for stomach 90 capsule 1  . Oxcarbazepine (TRILEPTAL) 300 MG tablet Take 300 mg by mouth every morning.     . potassium chloride (K-DUR) 10 MEQ tablet Take 1 tablet (10 mEq total) by mouth daily. 90 tablet 3  . sertraline (ZOLOFT) 100 MG tablet Take 200 mg by mouth every morning.     Marland Kitchen spironolactone (ALDACTONE) 25 MG tablet Take 1 tablet (25 mg total) by mouth every morning. 90 tablet 3  . vitamin E 1000 UNIT capsule Take 1,000 Units by mouth daily.     No current facility-administered medications for this visit.     Past Medical History  Diagnosis Date  . Syncope   . Systolic CHF ([place])   . Depression   . ICD (implantable cardiac defibrillator), biventricular, in situ   . LBBB (left bundle branch block)   . Dyslipidemia   . CHF (congestive heart failure) ([place])     PACEMAKER & DEFIB  . Fainted 04/21/06    AT [place]  . Hypertension   . Arthritis   . Wears glasses   . Headache(784.0)   . GERD (gastroesophageal reflux disease)   . HLD (hyperlipidemia)   . Hypothyroidism   . Cancer [facility])     left breast cancer   . ICD (implantable  cardiac defibrillator) in place     pt has pacer/icd  . Pacemaker     Guidant Device  . Complication of anesthesia     hypotensive after back surgery in 2006- reports on chart   . Nonischemic cardiomyopathy ([place])   . Normal coronary arteries     s/p cardiac cath 2007  . Memory loss   . Hearing loss   . Vertigo     ROS:   All systems reviewed and negative except as noted in the HPI.   Past Surgical History  Procedure Laterality Date  . Total knee arthroplasty  05/17/01    RIGHT KNEE  . Lumbar fusion  2006  . Mastectomy, partial  2008    GOT PACEMAKER AND DEFIB AT THAT TIME  . Breast surgery  2000    LUMP REMOVAL. STAGE 1 CANCER  . Joint replacement  06/14/01    right  . Pacemaker insertion  04/23/06  . Back surgery      lumbar fusion   . Cardiac catheterization    . Mass excision  11/08/2011     Procedure: EXCISION MASS;  Surgeon: [name], MD;  Location: [facility];  Service: General;  Laterality: Left;  Excision Left Thigh Mass  . Implantable cardioverter defibrillator generator change N/A 12/18/2012    Procedure: IMPLANTABLE CARDIOVERTER DEFIBRILLATOR GENERATOR CHANGE;  Surgeon: [name], MD;  Location: [facility] CATH LAB;  Service: Cardiovascular;  Laterality: N/A;  . Cataract extraction    . Eye surgery    . Total knee arthroplasty Left 11/29/2014    Procedure: TOTAL LEFT KNEE ARTHROPLASTY;  Surgeon: Paralee Cancel, MD;  Location: [facility];  Service: Orthopedics;  Laterality: Left;     Family History  Problem Relation Age of Onset  . Hypertension Mother   . Hypertension Father   . Hypertension Brother   . Hypertension Brother   . Arthritis Mother      Social History   Social History  . Marital Status: Married    Spouse Name: N/A  . Number of Children: 1  . Years of Education: Masters   Occupational History  . Retired    Social History Main Topics  . Smoking status: Never Smoker   . Smokeless tobacco: Never Used  . Alcohol Use: No  . Drug Use: No  . Sexual Activity: Not Currently    Birth Control/ Protection: Post-menopausal   Other Topics Concern  . Not on file   Social History Narrative   Lives at home with husband.   Right-handed.      As of 07/28/2014   Diet: No special diet   Caffeine: yes, Chocolate, tea and sodas    Married: YES, 1970   House: Yes, 2 stories, 2-3 persons live in home   Pets: No   Current/Past profession: Engineer, mining, Designer, jewellery    Exercise: Yes 2-3 x weekly   Living Will: Yes   DNR: No   POA/HPOA: No           BP 130/78 mmHg  Pulse 62  Ht 4\' 11"  (1.499 m)  Wt 126 lb 12.8 oz (57.516 kg)  BMI 25.60 kg/m2  Physical Exam:  Well appearing 82 year old woman, NAD HEENT: Unremarkable Neck:  7 cm JVD, no thyromegally Back:  No CVA tenderness Lungs:  Clear with no wheezes, rales, or rhonchi. Well healed ICD  incision. HEART:  Regular rate rhythm, no murmurs, no rubs, no clicks Abd:  soft, positive bowel sounds, no organomegally, no rebound, no  guarding Ext:  2 plus pulses, no edema, no cyanosis, no clubbing Skin:  No rashes no nodules Neuro:  CN II through XII intact, motor grossly intact  ECG - nsr with biv pacing  DEVICE  Normal device function.  See PaceArt for details.   Assess/Plan:

## 2015-05-10 NOTE — Assessment & Plan Note (Signed)
Her [employer] Sci BiV ICD is working normally. Will recheck in several months. 

## 2015-05-10 NOTE — Assessment & Plan Note (Signed)
Her symptoms remain class 2. She will continue her current meds. I have encouraged the patient to increase her physical activity.

## 2015-05-10 NOTE — Patient Instructions (Signed)
Medication Instructions:  Your physician recommends that you continue on your current medications as directed. Please refer to the Current Medication list given to you today.   Labwork: None ordered   Testing/Procedures: None ordered   Follow-Up: Your physician wants you to follow-up in: 12 months with Dr Knox Saliva will receive a reminder letter in the mail two months in advance. If you don't receive a letter, please call our office to schedule the follow-up appointment.   Remote monitoring is used to monitor your  ICD from home. This monitoring reduces the number of office visits required to check your device to one time per year. It allows [place] to keep an eye on the functioning of your device to ensure it is working properly. You are scheduled for a device check from home on 08/09/15. You may send your transmission at any time that day. If you have a wireless device, the transmission will be sent automatically. After your physician reviews your transmission, you will receive a postcard with your next transmission date.    Any Other Special Instructions Will Be Listed Below (If Applicable).     If you need a refill on your cardiac medications before your next appointment, please call your pharmacy.

## 2015-05-11 ENCOUNTER — Ambulatory Visit
Admission: RE | Admit: 2015-05-11 | Discharge: 2015-05-11 | Disposition: A | Discharge status: Home / Self Care | Payer: Medicare Other | Source: Ambulatory Visit | Attending: Internal Medicine | Admitting: Internal Medicine

## 2015-05-11 DIAGNOSIS — M25532 Pain in left wrist: Secondary | ICD-10-CM

## 2015-05-12 ENCOUNTER — Other Ambulatory Visit: Payer: Self-pay | Admitting: *Deleted

## 2015-05-12 DIAGNOSIS — M25532 Pain in left wrist: Secondary | ICD-10-CM

## 2015-05-20 ENCOUNTER — Encounter: Payer: Self-pay | Admitting: Internal Medicine

## 2015-05-20 ENCOUNTER — Ambulatory Visit (INDEPENDENT_AMBULATORY_CARE_PROVIDER_SITE_OTHER): Payer: Medicare Other | Admitting: Internal Medicine

## 2015-05-20 VITALS — BP 112/60 | HR 60 | Temp 97.7°F | Resp 20 | Ht 59.0 in | Wt 126.8 lb

## 2015-05-20 DIAGNOSIS — S62102A Fracture of unspecified carpal bone, left wrist, initial encounter for closed fracture: Secondary | ICD-10-CM

## 2015-05-20 DIAGNOSIS — W19XXXA Unspecified fall, initial encounter: Secondary | ICD-10-CM

## 2015-05-20 DIAGNOSIS — M25562 Pain in left knee: Secondary | ICD-10-CM

## 2015-05-20 DIAGNOSIS — R413 Other amnesia: Secondary | ICD-10-CM | POA: Diagnosis not present

## 2015-05-20 MED ORDER — BUPROPION HCL ER (XL) 150 MG PO TB24: 450.0000 mg | ORAL_TABLET | Freq: Every day | ORAL | Status: DC

## 2015-05-20 MED ORDER — FUROSEMIDE 40 MG PO TABS: ORAL_TABLET | ORAL | Status: DC

## 2015-05-20 MED ORDER — LEVOTHYROXINE SODIUM 100 MCG PO TABS: 100.0000 ug | ORAL_TABLET | Freq: Every day | ORAL | Status: DC

## 2015-05-20 MED ORDER — METOPROLOL TARTRATE 25 MG PO TABS: ORAL_TABLET | ORAL | Status: DC

## 2015-05-20 MED ORDER — TIZANIDINE HCL 4 MG PO TABS: 4.0000 mg | ORAL_TABLET | Freq: Four times a day (QID) | ORAL | Status: DC | PRN

## 2015-05-20 NOTE — Patient Instructions (Signed)
Continue current medications as ordered  Follow up with Ortho as scheduled  Follow up in 3 mos for routine visit

## 2015-05-20 NOTE — Progress Notes (Signed)
Patient ID: [name], female   DOB: [birth year], 82 y.o.   MRN: [id number]    Location:    [facility]   Place of Service:   OFFICE  Chief Complaint  Patient presents with  . Medical Management of Chronic Issues    f/u dementia, and left wrist fx (05/11/15)    HPI:  82 yo female seen today for f/u dementia. She was started on aricept 5mg  last month. She feels that her head is more clear. No diarrhea or nightmares.  Left distal radial nondisplaced fx - she fell on Jan 31st in parking lot. She was sent for xray and has a fx. She is followed by Dr Cay Schillings. She c/o left knee pain that is worse at night since her fall. She has an appt with Dr Alvan Dame next week. She is applying heat. No bruising.  She had a visit from [employer] provider yesterday. No recommendations made  Spouse is present today. Pt is a poor historian due to memory loss. Hx obtained from spouse  Needs med RF  Past Medical History  Diagnosis Date  . Syncope   . Systolic CHF ([place])   . Depression   . ICD (implantable cardiac defibrillator), biventricular, in situ   . LBBB (left bundle branch block)   . Dyslipidemia   . CHF (congestive heart failure) ([place])     PACEMAKER & DEFIB  . Fainted 04/21/06    AT [place]  . Hypertension   . Arthritis   . Wears glasses   . Headache(784.0)   . GERD (gastroesophageal reflux disease)   . HLD (hyperlipidemia)   . Hypothyroidism   . Cancer [facility])     left breast cancer   . ICD (implantable cardiac defibrillator) in place     pt has pacer/icd  . Pacemaker     Guidant Device  . Complication of anesthesia     hypotensive after back surgery in 2006- reports on chart   . Nonischemic cardiomyopathy ([place])   . Normal coronary arteries     s/p cardiac cath 2007  . Memory loss   . Hearing loss   . Vertigo     Past Surgical History  Procedure Laterality Date  . Total knee arthroplasty  05/17/01    RIGHT KNEE  . Lumbar fusion  2006  . Mastectomy, partial  2008    GOT PACEMAKER AND  DEFIB AT THAT TIME  . Breast surgery  2000    LUMP REMOVAL. STAGE 1 CANCER  . Joint replacement  06/14/01    right  . Pacemaker insertion  04/23/06  . Back surgery      lumbar fusion   . Cardiac catheterization    . Mass excision  11/08/2011    Procedure: EXCISION MASS;  Surgeon: [name], MD;  Location: [facility];  Service: General;  Laterality: Left;  Excision Left Thigh Mass  . Implantable cardioverter defibrillator generator change N/A 12/18/2012    Procedure: IMPLANTABLE CARDIOVERTER DEFIBRILLATOR GENERATOR CHANGE;  Surgeon: [name], MD;  [facility] CATH LAB;  Service: Cardiovascular;  Laterality: N/A;  . Cataract extraction    . Eye surgery    . Total knee arthroplasty Left 11/29/2014    Procedure: TOTAL LEFT KNEE ARTHROPLASTY;  Surgeon: Paralee Cancel, MD;  Location: [facility];  Service: Orthopedics;  Laterality: Left;    Patient Care Team: [name], DO as PCP - General (Internal Medicine) [name], MD as Consulting Physician (General Surgery) Paralee Cancel, MD as Consulting Physician (  Orthopedic Surgery) Marica Otter, OD (Optometry) Marcial Pacas, MD as Consulting Physician (Neurology) [name], MD as Consulting Physician (Cardiology)  Social History   Social History  . Marital Status: Married    Spouse Name: N/A  . Number of Children: 1  . Years of Education: Masters   Occupational History  . Retired    Social History Main Topics  . Smoking status: Never Smoker   . Smokeless tobacco: Never Used  . Alcohol Use: No  . Drug Use: No  . Sexual Activity: Not Currently    Birth Control/ Protection: Post-menopausal   Other Topics Concern  . Not on file   Social History Narrative   Lives at home with husband.   Right-handed.      As of 07/28/2014   Diet: No special diet   Caffeine: yes, Chocolate, tea and sodas    Married: YES, 1970   House: Yes, 2 stories, 2-3 persons live in home   Pets: No   Current/Past profession: Engineer, mining, Editor, commissioning    Exercise: Yes 2-3 x weekly   Living Will: Yes   DNR: No   POA/HPOA: No           reports that she has never smoked. She has never used smokeless tobacco. She reports that she does not drink alcohol or use illicit drugs.  Allergies  Allergen Reactions  . Shellfish Allergy Shortness Of Breath  . Iodine     Iodine contrast  . Aspirin Nausea And Vomiting  . Codeine Nausea And Vomiting    Medications: Patient's Medications  New Prescriptions   No medications on file  Previous Medications   ACETAMINOPHEN (TYLENOL) 325 MG TABLET    Take 2 tablets (650 mg total) by mouth every 6 (six) hours as needed for mild pain (or Fever >/= 101).   BUPROPION (WELLBUTRIN XL) 150 MG 24 HR TABLET    Take 450 mg by mouth daily.   CHOLECALCIFEROL (VITAMIN D) 1000 UNITS TABLET    Take 1,000 Units by mouth daily.   COENZYME Q10 200 MG CAPSULE    Take 200 mg by mouth daily.   DONEPEZIL (ARICEPT) 5 MG TABLET    Take one tablet by mouth at bedtime to preserve memory   FUROSEMIDE (LASIX) 40 MG TABLET    Take one (1) tablet (40 mg total) by mouth every morning and and half (1/2) tablet (20 mg total) by mouth every evening.   LEVOTHYROXINE (SYNTHROID, LEVOTHROID) 100 MCG TABLET    Take 100 mcg by mouth daily before breakfast.   MAGNESIUM 400 MG CAPS    Take 400 mg by mouth daily.   METOPROLOL TARTRATE (LOPRESSOR) 25 MG TABLET    Take 1 tablet (25 mg total) by mouth every morning and  take half (1/2) tablet  (12.5 mg total) by mouth  every evening.   OLMESARTAN (BENICAR) 40 MG TABLET    Take 1 tablet (40 mg total) by mouth every morning. For blood pressure   OMEGA-3 FATTY ACIDS (FISH OIL) 1200 MG CAPS    Take 1,200 mg by mouth every morning.   OMEPRAZOLE (PRILOSEC) 40 MG CAPSULE    Take 1 capsule by mouth  daily for stomach   OXCARBAZEPINE (TRILEPTAL) 300 MG TABLET    Take 300 mg by mouth every morning.    POTASSIUM CHLORIDE (K-DUR) 10 MEQ TABLET    Take 1 tablet (10 mEq total) by mouth daily.    SERTRALINE (ZOLOFT) 100 MG TABLET  Take 200 mg by mouth every morning.    SPIRONOLACTONE (ALDACTONE) 25 MG TABLET    Take 1 tablet (25 mg total) by mouth every morning.   VITAMIN E 1000 UNIT CAPSULE    Take 1,000 Units by mouth daily.  Modified Medications   No medications on file  Discontinued Medications   No medications on file    Review of Systems  Unable to perform ROS: Other    Filed Vitals:   05/20/15 1341  BP: 112/60  Pulse: 60  Temp: 97.7 F (36.5 C)  TempSrc: Oral  Resp: 20  Height: 4\' 11"  (1.499 m)  Weight: 126 lb 12.8 oz (57.516 kg)  SpO2: 96%   Body mass index is 25.6 kg/(m^2).  Physical Exam  Constitutional: She appears well-developed and well-nourished. No distress.  Musculoskeletal: She exhibits tenderness. Edema: left knee with reduced ROM and TTP. gait antalgic.  Left distal forearm/hand brace intact. FROM fingers. Capillary RF intact  Neurological: She is alert.  Skin: Skin is warm and dry. No rash noted.     Labs reviewed: Office Visit on 05/10/2015  Component Date Value Ref Range Status  . Date Time Interrogation Session 05/10/2015 [id number]   Final  . Pulse Generator Manufacturer 05/10/2015 BOST   Final  . Pulse Gen Model 05/10/2015 N141 ENERGEN CRT-D   Final  . Pulse Gen Serial Number 05/10/2015 [id number]   Final  . Implantable Pulse Generator Type 05/10/2015 Cardiac Resynch Therapy Defibulator   Final  . Implantable Pulse Generator Implan* 05/10/2015 [id number]   Final  . Implantable Lead Manufacturer 05/10/2015 GUIC   Final  . Implantable Lead Model 05/10/2015 0157   Final  . Implantable Lead Serial Number 05/10/2015 [id number]   Final  . Implantable Lead Implant Date 05/10/2015 [id number]   Final  . Implantable Lead Location 05/10/2015 [id number]   Final  . Implantable Lead Manufacturer 05/10/2015 GUIC   Final  . Implantable Lead Model 05/10/2015 4469   Final  . Implantable Lead Serial Number 05/10/2015 [id number]   Final  . Implantable  Lead Implant Date 05/10/2015 [id number]   Final  . Implantable Lead Location 05/10/2015 [id number]   Final  . Implantable Lead Manufacturer 05/10/2015 GUIC   Final  . Implantable Lead Model 05/10/2015 4555   Final  . Implantable Lead Serial Number 05/10/2015 [id number]   Final  . Implantable Lead Implant Date 05/10/2015 [id number]   Final  . Implantable Lead Location 05/10/2015 [id number]   Final  . Lead Channel Setting Sensing Sensi* 05/10/2015 0.6   Final  . Lead Channel Setting Sensing Adapt* 05/10/2015 Adaptive Sensing   Final  . Lead Channel Setting Sensing Sensi* 05/10/2015 1.0   Final  . Lead Channel Setting Sensing Adapt* 05/10/2015 Adaptive Sensing   Final  . Lead Channel Setting Pacing Amplit* 05/10/2015 2.0   Final  . Lead Channel Setting Pacing Pulse * 05/10/2015 0.4   Final  . Lead Channel Setting Pacing Amplit* 05/10/2015 2.0   Final  . Lead Channel Setting Pacing Pulse * 05/10/2015 0.8   Final  . Lead Channel Setting Pacing Amplit* 05/10/2015 2.4   Final  . Lead Channel Setting Pacing Captur* 05/10/2015 Fixed Pacing   Final  . Lead Channel Impedance Value 05/10/2015 775.0   Final  . Lead Channel Sensing Intrinsic Amp* 05/10/2015 25*  Final  . Lead Channel Pacing Threshold Ampl* 05/10/2015 1.0000   Final  . Lead Channel Pacing Threshold Puls* 05/10/2015 0.8   Final  . Lead Channel Impedance Value 05/10/2015  430.0   Final  . Lead Channel Sensing Intrinsic Amp* 05/10/2015 4.9   Final  . Lead Channel Pacing Threshold Ampl* 05/10/2015 0.7000   Final  . Lead Channel Pacing Threshold Puls* 05/10/2015 0.4   Final  . Lead Channel Impedance Value 05/10/2015 552.0   Final  . Lead Channel Sensing Intrinsic Amp* 05/10/2015 19.8   Final  . Lead Channel Pacing Threshold Ampl* 05/10/2015 0.8000   Final  . Lead Channel Pacing Threshold Puls* 05/10/2015 0.4   Final  . HighPow Impedance 05/10/2015 48.0   Final  . Battery Status 05/10/2015 BOS   Final  . Loletha Grayer Statistic RA Percent Paced 05/10/2015 1*   Final  . Loletha Grayer Statistic RV Percent Paced 05/10/2015 94   Final  . Eval Rhythm 05/10/2015 SR 62bpm   Final  Office Visit on 04/13/2015  Component Date Value Ref Range Status  . Glucose 04/13/2015 76  65 - 99 mg/dL Final  . BUN 04/13/2015 24  8 - 27 mg/dL Final  . Creatinine, Ser 04/13/2015 0.87  0.57 - 1.00 mg/dL Final  . GFR calc non Af Amer 04/13/2015 64  >59 mL/min/1.73 Final  . GFR calc Af Amer 04/13/2015 73  >59 mL/min/1.73 Final  . BUN/Creatinine Ratio 04/13/2015 28* 11 - 26 Final  . Sodium 04/13/2015 140  134 - 144 mmol/L Final  . Potassium 04/13/2015 4.7  3.5 - 5.2 mmol/L Final  . Chloride 04/13/2015 101  96 - 106 mmol/L Final  . CO2 04/13/2015 23  18 - 29 mmol/L Final  . Calcium 04/13/2015 10.3  8.7 - 10.3 mg/dL Final  . ALT 04/13/2015 26  0 - 32 IU/L Final  . TSH 04/13/2015 1.460  0.450 - 4.500 uIU/mL Final  . Cholesterol, Total 04/13/2015 190  100 - 199 mg/dL Final  . Triglycerides 04/13/2015 164* 0 - 149 mg/dL Final  . HDL 04/13/2015 56  >39 mg/dL Final  . VLDL Cholesterol Cal 04/13/2015 33  5 - 40 mg/dL Final  . LDL Calculated 04/13/2015 101* 0 - 99 mg/dL Final  . Chol/HDL Ratio 04/13/2015 3.4  0.0 - 4.4 ratio units Final   Comment:                                   T. Chol/HDL Ratio                                             Men  Women                               1/2 Avg.Risk  3.4    3.3                                   Avg.Risk  5.0    4.4                                2X Avg.Risk  9.6    7.1  3X Avg.Risk 23.4   11.0     Dg Wrist Complete Left  05/11/2015  CLINICAL DATA:  Fall yesterday with lateral wrist pain, initial encounter EXAM: LEFT WRIST - COMPLETE 3+ VIEW COMPARISON:  None. FINDINGS: There is a lucency through the radial styloid laterally without significant displacement consistent with a fracture. This extends into the radiocarpal articulation. Some degenerative changes in the carpal rows are noted. No other fracture is  seen. Generalized soft tissue swelling is noted. IMPRESSION: Distal radial fracture without significant displacement. Electronically Signed   By: Inez Catalina M.D.   On: 05/11/2015 11:58     Assessment/Plan   ICD-9-CM ICD-10-CM   1. Memory loss 780.93 R41.3   2. Left wrist fracture, closed, initial encounter (DOI 05/10/15) 814.00 S62.102A   3. Left knee pain 719.46 M25.562   4. Fall, initial encounter [phone] W19.XXXA    Continue current medications as ordered  Follow up with Ortho as scheduled  Follow up in 3 mos for routine visit   Tai Skelly S. Perlie Gold  [facility] [address] [place], [place] [address] [phone] Cell (Monday-Friday 8 AM - 5 PM) [phone] After 5 PM and follow prompts

## 2015-07-11 ENCOUNTER — Ambulatory Visit (INDEPENDENT_AMBULATORY_CARE_PROVIDER_SITE_OTHER): Payer: Medicare Other | Admitting: Nurse Practitioner

## 2015-07-11 ENCOUNTER — Encounter: Payer: Self-pay | Admitting: Nurse Practitioner

## 2015-07-11 VITALS — BP 114/76 | HR 66 | Resp 16 | Ht 59.0 in | Wt 130.0 lb

## 2015-07-11 DIAGNOSIS — R413 Other amnesia: Secondary | ICD-10-CM

## 2015-07-11 NOTE — Progress Notes (Signed)
[facility]  PATIENT: [name] DOB: May 02, 1933   REASON FOR VISIT: Follow-up for memory loss, depression HISTORY FROM: Patient    HISTORY OF PRESENT ILLNESS: HISTORY [name] is a 82 yo RH AAF referred by her primary care Dr. Baird Cancer for evaluation of memory trouble, drive herself to office alone at today's clinical visit. She is a retired Writer in 2003, she retired at age 73 because of her right knee pain, she had right knee replacement, in 2004, she also had lumbar decompression surgery by Dr. Nicholes Calamity under general anesthesia, woke up from surgery, she noticed mild memory trouble, she has short-term memory trouble, has been persistent since then, she denies difficulty talking, no strokelike symptoms then. She lives at home with her family, highly functional, driving, independent at daily activity, able to keep her check in balance, She suffered long-standing history of bipolar disorder, on polypharmacy treatment, this including Trileptal 300 mg daily, Zoloft 100 mg a day, Wellbutrin 150 mg 3 tablets a day, She had accident of sudden onset dizziness couple days ago, after dinner, she felt lightheaded, has to crawling upstairs, was helped by her husband to get up, then she fell to the ground, whole-body shaking, no loss of consciousness. She has baseline mild gait difficulty due to her low back pain, bilateral knee pain, She denied a family history of dementia, CT head in 2013, Unchanged mild atrophy and microvascular ischemic disease without acute intracranial process. She had a history of chronic systolic heart failure, left bundle branch block, status post biventricular ICD insertion in 2009, underwent device generator change out Sept 2014. S She presents for one year evaluation. She reports feeling dizzy Tuesday night with improvement by Wednesday morning. She also reports upper body "shaking". The only pain she has is from  arthritis. She gets some mild edema in her right ankle. She reports her blood pressure at home usually runs 110-120/60-70. Sometimes when she gets up quickly, she felt lightheaded, UPDATE April 8th 2016:YYShe is overall doing very well, only has occasionally dizziness, especially when she gets up quickly, today's Mini-Mental status examination is 29 out of 30 CAT scan of the brain showed mild small vessel disease, no acute lesions, EEG showed mild slowing UPDATE: 10/13/2016CM [name], 82 year old female returns for follow-up. She has a history of memory loss. Her Mini-Mental Status exam today is 28 out of 30. She continues to cook without difficulty she continues to drive without difficulty, not getting lost in familiar places. She returns for reevaluation. She gets very little exercise due to recent knee replacement UPDATE 07/11/2015 CM [name], 82 year old female returns for follow-up. She has a history of memory loss and her Mini-Mental Status exam score is 30 over 30 today. She was placed on low-dose Aricept by her primary care provider 2 months ago. She says "I feel the fog  is lifted" she continues to cook and she continues to drive without getting lost. She is alone at today's visit.    REVIEW OF SYSTEMS: Full 14 system review of systems performed and notable only for those listed, all others are neg:  Constitutional: neg  Cardiovascular: neg Ear/Nose/Throat: neg  Skin: neg Eyes: neg Respiratory: neg Gastroitestinal: neg  Hematology/Lymphatic: neg  Endocrine: neg Musculoskeletal:neg Allergy/Immunology: neg Neurological: neg Psychiatric: neg Sleep : neg   ALLERGIES: Allergies  Allergen Reactions  . Shellfish Allergy Shortness Of Breath  . Iodine     Iodine contrast  . Aspirin Nausea And Vomiting  .  Codeine Nausea And Vomiting    HOME MEDICATIONS: Outpatient Prescriptions Prior to Visit  Medication Sig Dispense Refill  . acetaminophen (TYLENOL) 325 MG tablet Take 2  tablets (650 mg total) by mouth every 6 (six) hours as needed for mild pain (or Fever >/= 101).    Marland Kitchen buPROPion (WELLBUTRIN XL) 150 MG 24 hr tablet Take 3 tablets (450 mg total) by mouth daily. 270 tablet 3  . cholecalciferol (VITAMIN D) 1000 UNITS tablet Take 1,000 Units by mouth daily.    . Coenzyme Q10 200 MG capsule Take 200 mg by mouth daily.    Marland Kitchen donepezil (ARICEPT) 5 MG tablet Take one tablet by mouth at bedtime to preserve memory 90 tablet 3  . furosemide (LASIX) 40 MG tablet Take one (1) tablet (40 mg total) by mouth every morning and and half (1/2) tablet (20 mg total) by mouth every evening. 135 tablet 3  . levothyroxine (SYNTHROID, LEVOTHROID) 100 MCG tablet Take 1 tablet (100 mcg total) by mouth daily before breakfast. 90 tablet 3  . Magnesium 400 MG CAPS Take 400 mg by mouth daily.    . metoprolol tartrate (LOPRESSOR) 25 MG tablet Take 1 tablet (25 mg total) by mouth every morning and  take half (1/2) tablet  (12.5 mg total) by mouth  every evening. 135 tablet 3  . olmesartan (BENICAR) 40 MG tablet Take 1 tablet (40 mg total) by mouth every morning. For blood pressure 90 tablet 3  . Omega-3 Fatty Acids (FISH OIL) 1200 MG CAPS Take 1,200 mg by mouth every morning.    Marland Kitchen omeprazole (PRILOSEC) 40 MG capsule Take 1 capsule by mouth  daily for stomach 90 capsule 1  . Oxcarbazepine (TRILEPTAL) 300 MG tablet Take 300 mg by mouth every morning.     . potassium chloride (K-DUR) 10 MEQ tablet Take 1 tablet (10 mEq total) by mouth daily. 90 tablet 3  . sertraline (ZOLOFT) 100 MG tablet Take 200 mg by mouth every morning.     Marland Kitchen spironolactone (ALDACTONE) 25 MG tablet Take 1 tablet (25 mg total) by mouth every morning. 90 tablet 3  . tiZANidine (ZANAFLEX) 4 MG tablet Take 1 tablet (4 mg total) by mouth every 6 (six) hours as needed for muscle spasms. 60 tablet 0  . vitamin E 1000 UNIT capsule Take 1,000 Units by mouth daily.     No facility-administered medications prior to visit.    PAST  MEDICAL HISTORY: Past Medical History  Diagnosis Date  . Syncope   . Systolic CHF ([place])   . Depression   . ICD (implantable cardiac defibrillator), biventricular, in situ   . LBBB (left bundle branch block)   . Dyslipidemia   . CHF (congestive heart failure) ([place])     PACEMAKER & DEFIB  . Fainted 04/21/06    AT [place]  . Hypertension   . Arthritis   . Wears glasses   . Headache(784.0)   . GERD (gastroesophageal reflux disease)   . HLD (hyperlipidemia)   . Hypothyroidism   . Cancer [facility])     left breast cancer   . ICD (implantable cardiac defibrillator) in place     pt has pacer/icd  . Pacemaker     Guidant Device  . Complication of anesthesia     hypotensive after back surgery in 2006- reports on chart   . Nonischemic cardiomyopathy ([place])   . Normal coronary arteries     s/p cardiac cath 2007  . Memory loss   . Hearing  loss   . Vertigo     PAST SURGICAL HISTORY: Past Surgical History  Procedure Laterality Date  . Total knee arthroplasty  05/17/01    RIGHT KNEE  . Lumbar fusion  2006  . Mastectomy, partial  2008    GOT PACEMAKER AND DEFIB AT THAT TIME  . Breast surgery  2000    LUMP REMOVAL. STAGE 1 CANCER  . Joint replacement  06/14/01    right  . Pacemaker insertion  04/23/06  . Back surgery      lumbar fusion   . Cardiac catheterization    . Mass excision  11/08/2011    Procedure: EXCISION MASS;  Surgeon: [name], MD;  Location: [facility] ORS;  Service: General;  Laterality: Left;  Excision Left Thigh Mass  . Implantable cardioverter defibrillator generator change N/A 12/18/2012    Procedure: IMPLANTABLE CARDIOVERTER DEFIBRILLATOR GENERATOR CHANGE;  Surgeon: [name], MD;  Location: [facility] CATH LAB;  Service: Cardiovascular;  Laterality: N/A;  . Cataract extraction    . Eye surgery    . Total knee arthroplasty Left 11/29/2014    Procedure: TOTAL LEFT KNEE ARTHROPLASTY;  Surgeon: Paralee Cancel, MD;  Location: [facility] ORS;  Service: Orthopedics;  Laterality: Left;     FAMILY HISTORY: Family History  Problem Relation Age of Onset  . Hypertension Mother   . Hypertension Father   . Hypertension Brother   . Hypertension Brother   . Arthritis Mother     SOCIAL HISTORY: Social History   Social History  . Marital Status: Married    Spouse Name: N/A  . Number of Children: 1  . Years of Education: Masters   Occupational History  . Retired    Social History Main Topics  . Smoking status: Never Smoker   . Smokeless tobacco: Never Used  . Alcohol Use: No  . Drug Use: No  . Sexual Activity: Not Currently    Birth Control/ Protection: Post-menopausal   Other Topics Concern  . Not on file   Social History Narrative   Lives at home with husband.   Right-handed.      As of 07/28/2014   Diet: No special diet   Caffeine: yes, Chocolate, tea and sodas    Married: YES, 1970   House: Yes, 2 stories, 2-3 persons live in home   Pets: No   Current/Past profession: Engineer, mining, Designer, jewellery    Exercise: Yes 2-3 x weekly   Living Will: Yes   DNR: No   POA/HPOA: No           PHYSICAL EXAM  Filed Vitals:   07/11/15 1129  BP: 114/76  Pulse: 66  Resp: 16  Height: 4\' 11"  (1.499 m)  Weight: 130 lb (58.968 kg)   Body mass index is 26.24 kg/(m^2). Generalized: Well developed, in no acute distress  Head: normocephalic and atraumatic,. Oropharynx benign  Neck: Supple, no carotid bruits  Cardiac: Regular rate rhythm, no murmur  Musculoskeletal: No deformity   Neurological examination   Mentation: Alert oriented to time, place, history taking. Attention span and concentration appropriate. MMSE 30/30.Follows all commands speech and language fluent.   Cranial nerve II-XII: Pupils were equal round reactive to light extraocular movements were full, visual field were full on confrontational test. Facial sensation and strength were normal. hearing was intact to finger rubbing bilaterally. Uvula tongue midline. head turning and  shoulder shrug were normal and symmetric.Tongue protrusion into cheek strength was normal. Motor: normal bulk and tone, full strength in the  BUE, BLE, fine finger movements normal, no pronator drift. No focal weakness Sensory: normal and symmetric to light touch, pinprick, and Vibration,  Coordination: finger-nose-finger, heel-to-shin bilaterally, no dysmetria Reflexes: Brachioradialis 2/2, biceps 2/2, triceps 2/2, patellar 2/2, Achilles 2/2, plantar responses were flexor bilaterally. Gait and Station: Rising up from seated position without assistance, cautious gait , no difficulty with turns, no assistive device   DIAGNOSTIC DATA (LABS, IMAGING, TESTING) - I reviewed patient records, labs, notes, testing and imaging myself where available.      Component Value Date/Time   NA 140 04/13/2015 1140   NA 136 12/01/2014 0446   K 4.7 04/13/2015 1140   CL 101 04/13/2015 1140   CO2 23 04/13/2015 1140   GLUCOSE 76 04/13/2015 1140   GLUCOSE 112* 12/01/2014 0446   BUN 24 04/13/2015 1140   BUN 13 12/01/2014 0446   CREATININE 0.87 04/13/2015 1140   CALCIUM 10.3 04/13/2015 1140   PROT 7.0 07/28/2014 1440   PROT 7.1 01/02/2013 1040   ALBUMIN 4.5 07/28/2014 1440   ALBUMIN 3.5 01/02/2013 1040   AST 15 07/28/2014 1440   ALT 26 04/13/2015 1140   ALKPHOS 73 07/28/2014 1440   BILITOT <0.2 07/28/2014 1440   BILITOT 0.2* 01/02/2013 1040   GFRNONAA 64 04/13/2015 1140   GFRAA 73 04/13/2015 1140   Lab Results  Component Value Date   CHOL 190 04/13/2015   HDL 56 04/13/2015   LDLCALC 101* 04/13/2015   TRIG 164* 04/13/2015   CHOLHDL 3.4 04/13/2015    Lab Results  Component Value Date   TSH 1.460 04/13/2015      ASSESSMENT AND PLAN 82 y.o. year old female has a past medical history of gradual onset of memory loss and history of depression and anxiety and polypharmacy treatment. CAT scan shows mild atrophy small vessel disease no acute lesions. Her memory score is stable at 30/30. Aricept  was begun 2 months ago by PCP.    Memory score is stable Exercise by walking daily for overall health and well-being Continue Aricept ordered by primary care Follow-up in 6 months for repeat memory testing Dennie Bible, [facility], [facility], [facility] [address], [place],  [address] [phone]

## 2015-07-11 NOTE — Progress Notes (Signed)
I have reviewed and agreed above plan. 

## 2015-07-11 NOTE — Patient Instructions (Signed)
Memory score is stable Continue Aricept at current dose F/U in 6 months

## 2015-07-21 ENCOUNTER — Ambulatory Visit: Payer: Medicare Other | Admitting: Nurse Practitioner

## 2015-08-09 ENCOUNTER — Ambulatory Visit (INDEPENDENT_AMBULATORY_CARE_PROVIDER_SITE_OTHER): Payer: Medicare Other | Admitting: *Deleted

## 2015-08-09 ENCOUNTER — Telehealth: Payer: Self-pay | Admitting: Internal Medicine

## 2015-08-09 ENCOUNTER — Telehealth: Payer: Self-pay | Admitting: Cardiology

## 2015-08-09 DIAGNOSIS — I428 Other cardiomyopathies: Secondary | ICD-10-CM

## 2015-08-09 DIAGNOSIS — I429 Cardiomyopathy, unspecified: Secondary | ICD-10-CM

## 2015-08-09 DIAGNOSIS — Z95810 Presence of automatic (implantable) cardiac defibrillator: Secondary | ICD-10-CM | POA: Diagnosis not present

## 2015-08-09 NOTE — Telephone Encounter (Signed)
LMOVM reminding pt to send remote transmission.   

## 2015-08-09 NOTE — Telephone Encounter (Signed)
error 

## 2015-08-11 NOTE — Progress Notes (Signed)
Remote ICD transmission.   

## 2015-08-19 ENCOUNTER — Encounter: Payer: Self-pay | Admitting: Internal Medicine

## 2015-08-19 ENCOUNTER — Ambulatory Visit (INDEPENDENT_AMBULATORY_CARE_PROVIDER_SITE_OTHER): Payer: Medicare Other | Admitting: Internal Medicine

## 2015-08-19 VITALS — BP 110/80 | HR 72 | Temp 98.1°F | Resp 20 | Ht 59.0 in | Wt 126.8 lb

## 2015-08-19 DIAGNOSIS — M25562 Pain in left knee: Secondary | ICD-10-CM

## 2015-08-19 DIAGNOSIS — E034 Atrophy of thyroid (acquired): Secondary | ICD-10-CM | POA: Diagnosis not present

## 2015-08-19 DIAGNOSIS — E038 Other specified hypothyroidism: Secondary | ICD-10-CM | POA: Diagnosis not present

## 2015-08-19 DIAGNOSIS — F32A Depression, unspecified: Secondary | ICD-10-CM

## 2015-08-19 DIAGNOSIS — F329 Major depressive disorder, single episode, unspecified: Secondary | ICD-10-CM

## 2015-08-19 DIAGNOSIS — D692 Other nonthrombocytopenic purpura: Secondary | ICD-10-CM | POA: Diagnosis not present

## 2015-08-19 DIAGNOSIS — R413 Other amnesia: Secondary | ICD-10-CM | POA: Diagnosis not present

## 2015-08-19 DIAGNOSIS — I5022 Chronic systolic (congestive) heart failure: Secondary | ICD-10-CM | POA: Diagnosis not present

## 2015-08-19 DIAGNOSIS — I10 Essential (primary) hypertension: Secondary | ICD-10-CM

## 2015-08-19 DIAGNOSIS — M25532 Pain in left wrist: Secondary | ICD-10-CM

## 2015-08-19 DIAGNOSIS — G250 Essential tremor: Secondary | ICD-10-CM

## 2015-08-19 MED ORDER — DONEPEZIL HCL 10 MG PO TABS: ORAL_TABLET | ORAL | Status: DC
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
## 2015-08-19 NOTE — Patient Instructions (Signed)
INCREASE aricept (memory pill) to 10mg  at bedtime  Continue other medications as ordered  Will call with lab results  follow up with Orthopedic as scheduled. recommend you also follow up with Dr Cay Schillings also for your hand  Follow up in 3 mos for routine visit

## 2015-08-20 LAB — CBC WITH DIFFERENTIAL/PLATELET
Basophils Absolute: 0 10*3/uL (ref 0.0–0.2)
Basos: 0 %
EOS (ABSOLUTE): 0.3 10*3/uL (ref 0.0–0.4)
Eos: 3 %
Hematocrit: 40.8 % (ref 34.0–46.6)
Hemoglobin: 12.9 g/dL (ref 11.1–15.9)
Immature Grans (Abs): 0 10*3/uL (ref 0.0–0.1)
Immature Granulocytes: 0 %
Lymphocytes Absolute: 2.1 10*3/uL (ref 0.7–3.1)
Lymphs: 23 %
MCH: 28 pg (ref 26.6–33.0)
MCHC: 31.6 g/dL (ref 31.5–35.7)
MCV: 89 fL (ref 79–97)
Monocytes Absolute: 0.6 10*3/uL (ref 0.1–0.9)
Monocytes: 7 %
Neutrophils Absolute: 6.1 10*3/uL (ref 1.4–7.0)
Neutrophils: 67 %
Platelets: 145 10*3/uL — ABNORMAL LOW (ref 150–379)
RBC: 4.6 x10E6/uL (ref 3.77–5.28)
RDW: 15.4 % (ref 12.3–15.4)
WBC: 9.1 10*3/uL (ref 3.4–10.8)

## 2015-08-20 LAB — BASIC METABOLIC PANEL
BUN/Creatinine Ratio: 27 (ref 12–28)
BUN: 29 mg/dL — ABNORMAL HIGH (ref 8–27)
CO2: 25 mmol/L (ref 18–29)
Calcium: 10.3 mg/dL (ref 8.7–10.3)
Chloride: 107 mmol/L — ABNORMAL HIGH (ref 96–106)
Creatinine, Ser: 1.06 mg/dL — ABNORMAL HIGH (ref 0.57–1.00)
GFR calc Af Amer: 58 mL/min/{1.73_m2} — ABNORMAL LOW (ref >59)
GFR calc non Af Amer: 50 mL/min/{1.73_m2} — ABNORMAL LOW (ref >59)
Glucose: 71 mg/dL (ref 65–99)
Potassium: 4.4 mmol/L (ref 3.5–5.2)
Sodium: 146 mmol/L — ABNORMAL HIGH (ref 134–144)

## 2015-08-20 LAB — ALT: ALT: 29 IU/L (ref 0–32)

## 2015-08-20 LAB — TSH: TSH: 1.73 u[IU]/mL (ref 0.450–4.500)

## 2015-08-22 ENCOUNTER — Other Ambulatory Visit: Payer: Self-pay | Admitting: *Deleted

## 2015-08-25 ENCOUNTER — Other Ambulatory Visit: Payer: Self-pay | Admitting: *Deleted

## 2015-09-16 ENCOUNTER — Encounter: Payer: Self-pay | Admitting: Cardiology

## 2015-09-17 ENCOUNTER — Other Ambulatory Visit: Payer: Self-pay | Admitting: Internal Medicine

## 2015-09-20 LAB — CUP PACEART REMOTE DEVICE CHECK
Battery Remaining Longevity: 96 mo
Battery Remaining Percentage: 100 %
Brady Statistic RA Percent Paced: 2 %
Brady Statistic RV Percent Paced: 95 %
Date Time Interrogation Session: 20170502232100
HighPow Impedance: 49 Ohm
Implantable Lead Implant Date: 20080116
Implantable Lead Implant Date: 20080116
Implantable Lead Implant Date: 20080116
Implantable Lead Location: 753859
Implantable Lead Location: 753860
Implantable Lead Location: 753860
Implantable Lead Model: 157
Implantable Lead Model: 4469
Implantable Lead Model: 4555
Implantable Lead Serial Number: 136532
Implantable Lead Serial Number: 161542
Implantable Lead Serial Number: 473495
Lead Channel Impedance Value: 415 Ohm
Lead Channel Impedance Value: 568 Ohm
Lead Channel Impedance Value: 743 Ohm
Lead Channel Pacing Threshold Amplitude: 0.7 V
Lead Channel Pacing Threshold Amplitude: 0.8 V
Lead Channel Pacing Threshold Amplitude: 1 V
Lead Channel Pacing Threshold Pulse Width: 0.4 ms
Lead Channel Pacing Threshold Pulse Width: 0.4 ms
Lead Channel Pacing Threshold Pulse Width: 0.8 ms
Lead Channel Setting Pacing Amplitude: 2 V
Lead Channel Setting Pacing Amplitude: 2 V
Lead Channel Setting Pacing Amplitude: 2.4 V
Lead Channel Setting Pacing Pulse Width: 0.4 ms
Lead Channel Setting Pacing Pulse Width: 0.8 ms
Lead Channel Setting Sensing Sensitivity: 0.6 mV
Lead Channel Setting Sensing Sensitivity: 1 mV
Pulse Gen Serial Number: 111765

## 2015-10-02 ENCOUNTER — Other Ambulatory Visit: Payer: Self-pay | Admitting: Internal Medicine

## 2015-10-03 ENCOUNTER — Encounter: Payer: Self-pay | Admitting: Cardiology

## 2015-10-04 ENCOUNTER — Telehealth: Payer: Self-pay | Admitting: *Deleted

## 2015-10-04 NOTE — Telephone Encounter (Signed)
noted 

## 2015-10-04 NOTE — Telephone Encounter (Signed)
Patient called and made an appointment with [name] in the morning and then was transferred to me. Patient stated that she has had pain radiating to stomach for 3 days now. Patient stated has gradually gotten worse. Does have regular Bowel movements with no blood in stool. Patient stated that is hurts worse today. Advised patient to go to Urgent Care/ER to be evaluated but patient refused and insisted that she will wait till tomorrow to be seen.

## 2015-10-05 ENCOUNTER — Ambulatory Visit (INDEPENDENT_AMBULATORY_CARE_PROVIDER_SITE_OTHER): Payer: Medicare Other | Admitting: Internal Medicine

## 2015-10-05 ENCOUNTER — Ambulatory Visit
Admission: RE | Admit: 2015-10-05 | Discharge: 2015-10-05 | Disposition: A | Discharge status: Home / Self Care | Payer: Medicare Other | Source: Ambulatory Visit | Attending: Internal Medicine | Admitting: Internal Medicine

## 2015-10-05 ENCOUNTER — Encounter: Payer: Self-pay | Admitting: Internal Medicine

## 2015-10-05 VITALS — BP 128/78 | HR 62 | Temp 98.0°F | Ht 59.0 in | Wt 127.0 lb

## 2015-10-05 DIAGNOSIS — R1031 Right lower quadrant pain: Secondary | ICD-10-CM | POA: Diagnosis not present

## 2015-10-05 DIAGNOSIS — I10 Essential (primary) hypertension: Secondary | ICD-10-CM

## 2015-10-05 DIAGNOSIS — I5022 Chronic systolic (congestive) heart failure: Secondary | ICD-10-CM

## 2015-10-05 DIAGNOSIS — W19XXXA Unspecified fall, initial encounter: Secondary | ICD-10-CM | POA: Diagnosis not present

## 2015-10-05 DIAGNOSIS — Z95810 Presence of automatic (implantable) cardiac defibrillator: Secondary | ICD-10-CM | POA: Diagnosis not present

## 2015-10-05 LAB — COMPREHENSIVE METABOLIC PANEL
ALT: 37 IU/L — ABNORMAL HIGH (ref 0–32)
AST: 32 IU/L (ref 0–40)
Albumin/Globulin Ratio: 2 (ref 1.2–2.2)
Albumin: 4.3 g/dL (ref 3.5–4.8)
Alkaline Phosphatase: 100 IU/L (ref 39–117)
BUN/Creatinine Ratio: 29 — ABNORMAL HIGH (ref 12–28)
BUN: 25 mg/dL (ref 8–27)
Bilirubin Total: <0.2 mg/dL (ref 0.0–1.2)
CO2: 30 mmol/L — ABNORMAL HIGH (ref 18–29)
Calcium: 10.8 mg/dL — ABNORMAL HIGH (ref 8.7–10.3)
Chloride: 104 mmol/L (ref 96–106)
Creatinine, Ser: 0.86 mg/dL (ref 0.57–1.00)
GFR calc Af Amer: 74 mL/min/{1.73_m2} (ref >59)
GFR calc non Af Amer: 64 mL/min/{1.73_m2} (ref >59)
Globulin, Total: 2.1 g/dL (ref 1.5–4.5)
Glucose: 99 mg/dL (ref 65–99)
Potassium: 4.3 mmol/L (ref 3.5–5.2)
Sodium: 139 mmol/L (ref 134–144)
Total Protein: 6.4 g/dL (ref 6.0–8.5)

## 2015-10-05 MED ORDER — TIZANIDINE HCL 4 MG PO TABS: 4.0000 mg | ORAL_TABLET | Freq: Four times a day (QID) | ORAL | Status: DC | PRN

## 2015-10-05 NOTE — Patient Instructions (Signed)
Continue current medications as ordered  Push fluids  Go to the ER if pain worsens or does not improve  Follow up as scheduled  Will call with lab results

## 2015-10-05 NOTE — Progress Notes (Signed)
Patient ID: [name], female   DOB: 10/10/1933, 82 y.o.   MRN: [id number]    Location:  [facility] Place of Service: OFFICE  Chief Complaint  Patient presents with  . Abdominal Pain    right side pain radiates to back constant pain worst when she stands up, pain started on Sunday    HPI:  82 yo female seen today for RLQ pain. She c/o RLQ pain --->around side to her back x 4 days. Pain is localized, constant and aching. Increases with movement and improves with sitting still. No dysurai, hematuria, urinary frequency/urgency, N/V, f/c. Pain worse with walking. Sx's new with no hx similar pain. She still has her appendix.  She fell off stool about 1 month ago while reaching for an object. No known injury. She is a poor historian due to memory loss. Hx obtained from chart  Past Medical History  Diagnosis Date  . Syncope   . Systolic CHF ([place])   . Depression   . ICD (implantable cardiac defibrillator), biventricular, in situ   . LBBB (left bundle branch block)   . Dyslipidemia   . CHF (congestive heart failure) ([place])     PACEMAKER & DEFIB  . Fainted 04/21/06    AT [place]  . Hypertension   . Arthritis   . Wears glasses   . Headache(784.0)   . GERD (gastroesophageal reflux disease)   . HLD (hyperlipidemia)   . Hypothyroidism   . Cancer [facility])     left breast cancer   . ICD (implantable cardiac defibrillator) in place     pt has pacer/icd  . Pacemaker     Guidant Device  . Complication of anesthesia     hypotensive after back surgery in 2006- reports on chart   . Nonischemic cardiomyopathy ([place])   . Normal coronary arteries     s/p cardiac cath 2007  . Memory loss   . Hearing loss   . Vertigo     Past Surgical History  Procedure Laterality Date  . Total knee arthroplasty  05/17/01    RIGHT KNEE  . Lumbar fusion  2006  . Mastectomy, partial  2008    GOT PACEMAKER AND DEFIB AT THAT TIME  . Breast surgery  2000    LUMP REMOVAL. STAGE 1 CANCER  . Joint replacement  06/14/01      right  . Pacemaker insertion  04/23/06  . Back surgery      lumbar fusion   . Cardiac catheterization    . Mass excision  11/08/2011    Procedure: EXCISION MASS;  Surgeon: [name], MD;  Location: [facility] ORS;  Service: General;  Laterality: Left;  Excision Left Thigh Mass  . Implantable cardioverter defibrillator generator change N/A 12/18/2012    Procedure: IMPLANTABLE CARDIOVERTER DEFIBRILLATOR GENERATOR CHANGE;  Surgeon: [name], MD;  Location: [facility] CATH LAB;  Service: Cardiovascular;  Laterality: N/A;  . Cataract extraction    . Eye surgery    . Total knee arthroplasty Left 11/29/2014    Procedure: TOTAL LEFT KNEE ARTHROPLASTY;  Surgeon: Paralee Cancel, MD;  Location: [facility] ORS;  Service: Orthopedics;  Laterality: Left;    Patient Care Team: [name], DO as PCP - General (Internal Medicine) [name], MD as Consulting Physician (General Surgery) Paralee Cancel, MD as Consulting Physician (Orthopedic Surgery) Marica Otter, [place] (Optometry) Marcial Pacas, MD as Consulting Physician (Neurology) [name], MD as Consulting Physician (Cardiology)  Social History   Social History  . Marital Status:  Married    Spouse Name: N/A  . Number of Children: 1  . Years of Education: Masters   Occupational History  . Retired    Social History Main Topics  . Smoking status: Never Smoker   . Smokeless tobacco: Never Used  . Alcohol Use: No  . Drug Use: No  . Sexual Activity: Not Currently    Birth Control/ Protection: Post-menopausal   Other Topics Concern  . Not on file   Social History Narrative   Lives at home with husband.   Right-handed.      As of 07/28/2014   Diet: No special diet   Caffeine: yes, Chocolate, tea and sodas    Married: YES, 1970   House: Yes, 2 stories, 2-3 persons live in home   Pets: No   Current/Past profession: Engineer, mining, Designer, jewellery    Exercise: Yes 2-3 x weekly   Living Will: Yes   DNR: No   POA/HPOA: No           reports that  she has never smoked. She has never used smokeless tobacco. She reports that she does not drink alcohol or use illicit drugs.  Family History  Problem Relation Age of Onset  . Hypertension Mother   . Hypertension Father   . Hypertension Brother   . Hypertension Brother   . Arthritis Mother    Family Status  Relation Status Death Age  . Mother Deceased   . Father Deceased   . Brother Alive   . Brother Alive   . Daughter Alive      Allergies  Allergen Reactions  . Shellfish Allergy Shortness Of Breath  . Iodine     Iodine contrast  . Aspirin Nausea And Vomiting  . Codeine Nausea And Vomiting    Medications: Patient's Medications  New Prescriptions   No medications on file  Previous Medications   ACETAMINOPHEN (TYLENOL) 325 MG TABLET    Take 2 tablets (650 mg total) by mouth every 6 (six) hours as needed for mild pain (or Fever >/= 101).   BUPROPION (WELLBUTRIN XL) 150 MG 24 HR TABLET    Take 3 tablets (450 mg total) by mouth daily.   CHOLECALCIFEROL (VITAMIN D) 1000 UNITS TABLET    Take 1,000 Units by mouth daily.   DONEPEZIL (ARICEPT) 10 MG TABLET    Take one tablet by mouth at bedtime to preserve memory   FUROSEMIDE (LASIX) 40 MG TABLET    Take one (1) tablet (40 mg total) by mouth every morning and and half (1/2) tablet (20 mg total) by mouth every evening.   LEVOTHYROXINE (SYNTHROID, LEVOTHROID) 100 MCG TABLET    Take 1 tablet (100 mcg total) by mouth daily before breakfast.   MAGNESIUM 400 MG CAPS    Take 400 mg by mouth daily.   METOPROLOL TARTRATE (LOPRESSOR) 25 MG TABLET    Take 1 tablet (25 mg total) by mouth every morning and  take half (1/2) tablet  (12.5 mg total) by mouth  every evening.   OLMESARTAN (BENICAR) 40 MG TABLET    Take 1 tablet (40 mg total) by mouth every morning. For blood pressure   OMEGA-3 FATTY ACIDS (FISH OIL) 1200 MG CAPS    Take 1,200 mg by mouth every morning.   OMEPRAZOLE (PRILOSEC) 40 MG CAPSULE    Take 1 capsule by mouth  daily for  stomach   OXCARBAZEPINE (TRILEPTAL) 300 MG TABLET    Take 300 mg by mouth every morning.  POTASSIUM CHLORIDE (K-DUR) 10 MEQ TABLET    Take 1 tablet (10 mEq total) by mouth daily.   SERTRALINE (ZOLOFT) 100 MG TABLET    Take 200 mg by mouth every morning.    SPIRONOLACTONE (ALDACTONE) 25 MG TABLET    Take 1 tablet by mouth  every morning   TIZANIDINE (ZANAFLEX) 4 MG TABLET    Take 1 tablet (4 mg total) by mouth every 6 (six) hours as needed for muscle spasms.   VITAMIN E 1000 UNIT CAPSULE    Take 1,000 Units by mouth daily.  Modified Medications   No medications on file  Discontinued Medications   No medications on file    Review of Systems  Unable to perform ROS: Other  memory loss  Filed Vitals:   10/05/15 0832  BP: 128/78  Pulse: 62  Temp: 98 F (36.7 C)  TempSrc: Oral  Height: 4\' 11"  (1.499 m)  Weight: 127 lb (57.607 kg)  SpO2: 97%   Body mass index is 25.64 kg/(m^2).  Physical Exam  Constitutional: She appears well-developed.  Frail appearing in NAD  HENT:  Mouth/Throat: Oropharynx is clear and moist. No oropharyngeal exudate.  Eyes: Pupils are equal, round, and reactive to light. No scleral icterus.  Neck: Neck supple. Carotid bruit is not present. No tracheal deviation present. No thyromegaly present.  Cardiovascular: Normal rate, regular rhythm and intact distal pulses.  Exam reveals no gallop and no friction rub.   Murmur (1/6 SEM) heard. No LE edema b/l. no calf TTP.   Pulmonary/Chest: Effort normal and breath sounds normal. No stridor. No respiratory distress. She has no wheezes. She has no rales.  Abdominal: Soft. Bowel sounds are normal. She exhibits no distension and no mass. There is no hepatomegaly. There is tenderness (RLQ ). There is no rebound and no guarding.  Musculoskeletal: She exhibits tenderness (b/l ASIS).  Lymphadenopathy:    She has no cervical adenopathy.  Neurological: She is alert.  Skin: Skin is warm and dry. No rash noted.  Psychiatric:  She has a normal mood and affect. Her behavior is normal. Thought content normal.     Labs reviewed: Office Visit on 08/19/2015  Component Date Value Ref Range Status  . WBC 08/19/2015 9.1  3.4 - 10.8 x10E3/uL Final  . RBC 08/19/2015 4.60  3.77 - 5.28 x10E6/uL Final  . Hemoglobin 08/19/2015 12.9  11.1 - 15.9 g/dL Final  . Hematocrit 08/19/2015 40.8  34.0 - 46.6 % Final  . MCV 08/19/2015 89  79 - 97 fL Final  . MCH 08/19/2015 28.0  26.6 - 33.0 pg Final  . MCHC 08/19/2015 31.6  31.5 - 35.7 g/dL Final  . RDW 08/19/2015 15.4  12.3 - 15.4 % Final  . Platelets 08/19/2015 145* 150 - 379 x10E3/uL Final  . Neutrophils 08/19/2015 67   Final  . Lymphs 08/19/2015 23   Final  . Monocytes 08/19/2015 7   Final  . Eos 08/19/2015 3   Final  . Basos 08/19/2015 0   Final  . Neutrophils Absolute 08/19/2015 6.1  1.4 - 7.0 x10E3/uL Final  . Lymphocytes Absolute 08/19/2015 2.1  0.7 - 3.1 x10E3/uL Final  . Monocytes Absolute 08/19/2015 0.6  0.1 - 0.9 x10E3/uL Final  . EOS (ABSOLUTE) 08/19/2015 0.3  0.0 - 0.4 x10E3/uL Final  . Basophils Absolute 08/19/2015 0.0  0.0 - 0.2 x10E3/uL Final  . Immature Granulocytes 08/19/2015 0   Final  . Immature Grans (Abs) 08/19/2015 0.0  0.0 - 0.1 x10E3/uL Final  .  Glucose 08/19/2015 71  65 - 99 mg/dL Final  . BUN 08/19/2015 29* 8 - 27 mg/dL Final  . Creatinine, Ser 08/19/2015 1.06* 0.57 - 1.00 mg/dL Final  . GFR calc non Af Amer 08/19/2015 50* >59 mL/min/1.73 Final  . GFR calc Af Amer 08/19/2015 58* >59 mL/min/1.73 Final  . BUN/Creatinine Ratio 08/19/2015 27  12 - 28 Final  . Sodium 08/19/2015 146* 134 - 144 mmol/L Final  . Potassium 08/19/2015 4.4  3.5 - 5.2 mmol/L Final  . Chloride 08/19/2015 107* 96 - 106 mmol/L Final  . CO2 08/19/2015 25  18 - 29 mmol/L Final  . Calcium 08/19/2015 10.3  8.7 - 10.3 mg/dL Final  . ALT 08/19/2015 29  0 - 32 IU/L Final  . TSH 08/19/2015 1.730  0.450 - 4.500 uIU/mL Final  Clinical Support on 08/09/2015  Component Date Value Ref Range  Status  . Date Time Interrogation Session 08/09/2015 [id number]   Final  . Pulse Generator Manufacturer 08/09/2015 BOST   Final  . Pulse Gen Model 08/09/2015 N141 ENERGEN CRT-D   Final  . Pulse Gen Serial Number 08/09/2015 [id number]   Final  . Implantable Pulse Generator Type 08/09/2015 Cardiac Resynch Therapy Defibulator   Final  . Implantable Pulse Generator Implan* 08/09/2015 [id number]   Final  . Implantable Lead Manufacturer 08/09/2015 GUIC   Final  . Implantable Lead Model 08/09/2015 0157   Final  . Implantable Lead Serial Number 08/09/2015 [id number]   Final  . Implantable Lead Implant Date 08/09/2015 [id number]   Final  . Implantable Lead Location 08/09/2015 [id number]   Final  . Implantable Lead Manufacturer 08/09/2015 GUIC   Final  . Implantable Lead Model 08/09/2015 4469   Final  . Implantable Lead Serial Number 08/09/2015 [id number]   Final  . Implantable Lead Implant Date 08/09/2015 [id number]   Final  . Implantable Lead Location 08/09/2015 [id number]   Final  . Implantable Lead Manufacturer 08/09/2015 GUIC   Final  . Implantable Lead Model 08/09/2015 4555   Final  . Implantable Lead Serial Number 08/09/2015 [id number]   Final  . Implantable Lead Implant Date 08/09/2015 [id number]   Final  . Implantable Lead Location 08/09/2015 [id number]   Final  . Lead Channel Setting Sensing Sensi* 08/09/2015 0.6   Final  . Lead Channel Setting Sensing Adapt* 08/09/2015 Adaptive Sensing   Final  . Lead Channel Setting Sensing Sensi* 08/09/2015 1.0   Final  . Lead Channel Setting Sensing Adapt* 08/09/2015 Adaptive Sensing   Final  . Lead Channel Setting Pacing Amplit* 08/09/2015 2.0   Final  . Lead Channel Setting Pacing Pulse * 08/09/2015 0.4   Final  . Lead Channel Setting Pacing Amplit* 08/09/2015 2.0   Final  . Lead Channel Setting Pacing Pulse * 08/09/2015 0.8   Final  . Lead Channel Setting Pacing Amplit* 08/09/2015 2.4   Final  . Lead Channel Impedance Value 08/09/2015 743   Final  . Lead Channel  Pacing Threshold Ampl* 08/09/2015 1.0   Final  . Lead Channel Pacing Threshold Puls* 08/09/2015 0.8   Final  . Lead Channel Impedance Value 08/09/2015 415   Final  . Lead Channel Pacing Threshold Ampl* 08/09/2015 0.7   Final  . Lead Channel Pacing Threshold Puls* 08/09/2015 0.4   Final  . Lead Channel Impedance Value 08/09/2015 568   Final  . Lead Channel Pacing Threshold Ampl* 08/09/2015 0.8   Final  . Lead Channel Pacing Threshold Puls* 08/09/2015 0.4   Final  . HighPow Impedance 08/09/2015  49   Final  . Battery Status 08/09/2015 BOS   Final  . Battery Remaining Longevity 08/09/2015 96   Final  . Battery Remaining Percentage 08/09/2015 100   Final  . Loletha Grayer Statistic RA Percent Paced 08/09/2015 2   Final  . Loletha Grayer Statistic RV Percent Paced 08/09/2015 95   Final  . Eval Rhythm 08/09/2015 AsBp   Final    No results found.   Assessment/Plan   ICD-9-CM ICD-10-CM   1. RLQ abdominal pain 789.03 R10.31 CMP     Lipase     CBC with Differential/Platelets     CT ABDOMEN PELVIS WO CONTRAST     CANCELED: CT ABDOMEN PELVIS W WO CONTRAST   r/o appendicitis  2. Chronic systolic heart failure (HCC) 428.22 I50.22   3. ICD (implantable cardioverter-defibrillator), biventricular, in situ V45.02 Z95.810   4. Benign hypertension 401.1 I10    Continue current medications as ordered  Push fluids  Go to the ER if pain worsens or does not improve  Follow up as scheduled  Will call with lab results    [facility] S. Perlie Gold  [facility] [address] [place], [place] [address] [phone] Cell (Monday-Friday 8 AM - 5 PM) [phone] After 5 PM and follow prompts

## 2015-10-06 LAB — CBC WITH DIFFERENTIAL/PLATELET
Basophils Absolute: 0 10*3/uL (ref 0.0–0.2)
Basos: 0 %
EOS (ABSOLUTE): 0.3 10*3/uL (ref 0.0–0.4)
Eos: 3 %
Hematocrit: 39.4 % (ref 34.0–46.6)
Hemoglobin: 12.7 g/dL (ref 11.1–15.9)
Immature Grans (Abs): 0 10*3/uL (ref 0.0–0.1)
Immature Granulocytes: 0 %
Lymphocytes Absolute: 1.9 10*3/uL (ref 0.7–3.1)
Lymphs: 23 %
MCH: 28 pg (ref 26.6–33.0)
MCHC: 32.2 g/dL (ref 31.5–35.7)
MCV: 87 fL (ref 79–97)
Monocytes Absolute: 0.6 10*3/uL (ref 0.1–0.9)
Monocytes: 8 %
Neutrophils Absolute: 5.3 10*3/uL (ref 1.4–7.0)
Neutrophils: 66 %
Platelets: 129 10*3/uL — ABNORMAL LOW (ref 150–379)
RBC: 4.53 x10E6/uL (ref 3.77–5.28)
RDW: 15.3 % (ref 12.3–15.4)
WBC: 8.1 10*3/uL (ref 3.4–10.8)

## 2015-10-06 LAB — LIPASE: Lipase: 29 U/L (ref 0–59)

## 2015-10-10 ENCOUNTER — Encounter: Payer: Self-pay | Admitting: *Deleted

## 2015-11-10 ENCOUNTER — Ambulatory Visit (INDEPENDENT_AMBULATORY_CARE_PROVIDER_SITE_OTHER): Payer: Medicare Other | Admitting: *Deleted

## 2015-11-10 DIAGNOSIS — I428 Other cardiomyopathies: Secondary | ICD-10-CM

## 2015-11-10 DIAGNOSIS — Z95810 Presence of automatic (implantable) cardiac defibrillator: Secondary | ICD-10-CM

## 2015-11-11 NOTE — Progress Notes (Signed)
Remote ICD transmission.   

## 2015-11-16 ENCOUNTER — Encounter: Payer: Self-pay | Admitting: Cardiology

## 2015-11-21 ENCOUNTER — Other Ambulatory Visit: Payer: Self-pay | Admitting: Internal Medicine

## 2015-11-29 LAB — CUP PACEART REMOTE DEVICE CHECK
Battery Remaining Longevity: 96 mo
Battery Remaining Percentage: 100 %
Brady Statistic RA Percent Paced: 2 %
Brady Statistic RV Percent Paced: 91 %
Date Time Interrogation Session: 20170803110000
HighPow Impedance: 51 Ohm
Implantable Lead Implant Date: 20080116
Implantable Lead Implant Date: 20080116
Implantable Lead Implant Date: 20080116
Implantable Lead Location: 753859
Implantable Lead Location: 753860
Implantable Lead Location: 753860
Implantable Lead Model: 157
Implantable Lead Model: 4469
Implantable Lead Model: 4555
Implantable Lead Serial Number: 136532
Implantable Lead Serial Number: 161542
Implantable Lead Serial Number: 473495
Lead Channel Impedance Value: 419 Ohm
Lead Channel Impedance Value: 584 Ohm
Lead Channel Impedance Value: 728 Ohm
Lead Channel Pacing Threshold Amplitude: 0.7 V
Lead Channel Pacing Threshold Amplitude: 0.8 V
Lead Channel Pacing Threshold Amplitude: 1 V
Lead Channel Pacing Threshold Pulse Width: 0.4 ms
Lead Channel Pacing Threshold Pulse Width: 0.4 ms
Lead Channel Pacing Threshold Pulse Width: 0.8 ms
Lead Channel Setting Pacing Amplitude: 2 V
Lead Channel Setting Pacing Amplitude: 2 V
Lead Channel Setting Pacing Amplitude: 2.4 V
Lead Channel Setting Pacing Pulse Width: 0.4 ms
Lead Channel Setting Pacing Pulse Width: 0.8 ms
Lead Channel Setting Sensing Sensitivity: 0.6 mV
Lead Channel Setting Sensing Sensitivity: 1 mV
Pulse Gen Serial Number: 111765

## 2015-11-30 ENCOUNTER — Ambulatory Visit (INDEPENDENT_AMBULATORY_CARE_PROVIDER_SITE_OTHER): Payer: Medicare Other | Admitting: Internal Medicine

## 2015-11-30 ENCOUNTER — Encounter: Payer: Self-pay | Admitting: Internal Medicine

## 2015-11-30 VITALS — BP 102/62 | HR 60 | Temp 97.3°F | Ht 59.0 in | Wt 128.0 lb

## 2015-11-30 DIAGNOSIS — E034 Atrophy of thyroid (acquired): Secondary | ICD-10-CM | POA: Diagnosis not present

## 2015-11-30 DIAGNOSIS — Z95810 Presence of automatic (implantable) cardiac defibrillator: Secondary | ICD-10-CM | POA: Diagnosis not present

## 2015-11-30 DIAGNOSIS — I5022 Chronic systolic (congestive) heart failure: Secondary | ICD-10-CM | POA: Diagnosis not present

## 2015-11-30 DIAGNOSIS — F329 Major depressive disorder, single episode, unspecified: Secondary | ICD-10-CM | POA: Diagnosis not present

## 2015-11-30 DIAGNOSIS — J302 Other seasonal allergic rhinitis: Secondary | ICD-10-CM | POA: Diagnosis not present

## 2015-11-30 DIAGNOSIS — R413 Other amnesia: Secondary | ICD-10-CM | POA: Diagnosis not present

## 2015-11-30 DIAGNOSIS — F32A Depression, unspecified: Secondary | ICD-10-CM

## 2015-11-30 DIAGNOSIS — E038 Other specified hypothyroidism: Secondary | ICD-10-CM

## 2015-11-30 DIAGNOSIS — G250 Essential tremor: Secondary | ICD-10-CM

## 2015-11-30 MED ORDER — DONEPEZIL HCL 10 MG PO TABS
ORAL_TABLET | ORAL | 1 refills | Status: DC
Start: 2015-11-30 — End: 2016-02-10

## 2015-11-30 NOTE — Patient Instructions (Addendum)
Continue current medications as ordered  Change positions slowly  Recommend children's OTC allergy medication (zyrtec, claritin or allegra) daily  Follow up in 3 mos for CPE. Fasting labs prior to appt

## 2015-11-30 NOTE — Progress Notes (Signed)
Patient ID: [name], female   DOB: 06-15-1933, 82 y.o.   MRN: [id number]    Location:  [facility] Place of Service: OFFICE  Chief Complaint  Patient presents with  . Medical Management of Chronic Issues    3 month follow up    HPI:  82 yo female seen today for f/u. She reports 2 day hx ear pressure. No HA but does have dizziness. She takes meclizine qhs but notices next day sleepiness with associated staggering. Occasional ear stinging sensation. No sore throat or sinus pressure. No f/c.  She c/o rectal mass with BMs that she has to "push back in" - last colonoscopy in 06/2011.  Dementia - stable on aricept. She has not noticed any worsening sx's.  Hx Left distal radial nondisplaced fx - she fell on Jan 31st in parking lot. She was sent for xray and had a fx. She is followed by Dr Cay Schillings. Hand is painful occasionally. She c/o left knee pain that is worse at night since her fall.  Dr Alvan Dame follows knee pain. She is applying cool compresses at night. She takes zanaflex prn   She has good and bad days for depression. Weight up 1 lb since May. She sees Dr Reece Levy for psych. Currently taking sertraline, trileptal and wellbutrin xl. She has mentioned her weight loss to her mental health provider  She c/o occasional easy bruising x >1 year. She has ASA sensitivity and does not take med. She is taking some meds that will cause it. She is taking flax seed which helps reduce amt  Chronic systolic HF/AICD/LBBB/NICM - followed by cardiology. She takes lasix, lopressor, benicar, aldactone  Hyperlipidemia - diet controlled. LDL 101  Thyroid stable on levothyroxine. TSH 1.73  She is a poor historian due to memory loss. Hx obtained from chart.   Past Medical History:  Diagnosis Date  . Arthritis   . Cancer [facility])    left breast cancer   . CHF (congestive heart failure) ([place])    PACEMAKER & DEFIB  . Complication of anesthesia    hypotensive after back surgery in 2006- reports on chart   .  Depression   . Dyslipidemia   . Fainted 04/21/06   AT [place]  . GERD (gastroesophageal reflux disease)   . Headache(784.0)   . Hearing loss   . HLD (hyperlipidemia)   . Hypertension   . Hypothyroidism   . ICD (implantable cardiac defibrillator) in place    pt has pacer/icd  . ICD (implantable cardiac defibrillator), biventricular, in situ   . LBBB (left bundle branch block)   . Memory loss   . Nonischemic cardiomyopathy ([place])   . Normal coronary arteries    s/p cardiac cath 2007  . Pacemaker    Guidant Device  . Syncope   . Systolic CHF ([place])   . Vertigo   . Wears glasses     Past Surgical History:  Procedure Laterality Date  . BACK SURGERY     lumbar fusion   . BREAST SURGERY  2000   LUMP REMOVAL. STAGE 1 CANCER  . CARDIAC CATHETERIZATION    . CATARACT EXTRACTION    . EYE SURGERY    . IMPLANTABLE CARDIOVERTER DEFIBRILLATOR GENERATOR CHANGE N/A 12/18/2012   Procedure: IMPLANTABLE CARDIOVERTER DEFIBRILLATOR GENERATOR CHANGE;  Surgeon: [name], MD;  Location: [facility] CATH LAB;  Service: Cardiovascular;  Laterality: N/A;  . JOINT REPLACEMENT  06/14/01   right  . LUMBAR FUSION  2006  . MASS EXCISION  11/08/2011  Procedure: EXCISION MASS;  Surgeon: [name], MD;  Location: [facility] ORS;  Service: General;  Laterality: Left;  Excision Left Thigh Mass  . MASTECTOMY, PARTIAL  2008   GOT PACEMAKER AND DEFIB AT THAT TIME  . PACEMAKER INSERTION  04/23/06  . TOTAL KNEE ARTHROPLASTY  05/17/01   RIGHT KNEE  . TOTAL KNEE ARTHROPLASTY Left 11/29/2014   Procedure: TOTAL LEFT KNEE ARTHROPLASTY;  Surgeon: Paralee Cancel, MD;  Location: [facility] ORS;  Service: Orthopedics;  Laterality: Left;    Patient Care Team: [name], DO as PCP - General (Internal Medicine) [name], MD as Consulting Physician (General Surgery) Paralee Cancel, MD as Consulting Physician (Orthopedic Surgery) Marica Otter, [place] (Optometry) Marcial Pacas, MD as Consulting Physician (Neurology) [name], MD as Consulting  Physician (Cardiology)  Social History   Social History  . Marital status: Married    Spouse name: N/A  . Number of children: 1  . Years of education: Masters   Occupational History  . Retired    Social History Main Topics  . Smoking status: Never Smoker  . Smokeless tobacco: Never Used  . Alcohol use No  . Drug use: No  . Sexual activity: Not Currently    Birth control/ protection: Post-menopausal   Other Topics Concern  . Not on file   Social History Narrative   Lives at home with husband.   Right-handed.      As of 07/28/2014   Diet: No special diet   Caffeine: yes, Chocolate, tea and sodas    Married: YES, 1970   House: Yes, 2 stories, 2-3 persons live in home   Pets: No   Current/Past profession: Engineer, mining, Designer, jewellery    Exercise: Yes 2-3 x weekly   Living Will: Yes   DNR: No   POA/HPOA: No           reports that she has never smoked. She has never used smokeless tobacco. She reports that she does not drink alcohol or use drugs.  Family History  Problem Relation Age of Onset  . Hypertension Mother   . Arthritis Mother   . Hypertension Father   . Hypertension Brother   . Hypertension Brother    Family Status  Relation Status  . Mother Deceased  . Father Deceased  . Brother Alive  . Brother Alive  . Daughter Alive     Allergies  Allergen Reactions  . Iodine Shortness Of Breath    Iodine contrast, CHF , SOB  . Shellfish Allergy Shortness Of Breath  . Aspirin Nausea And Vomiting  . Codeine Nausea And Vomiting    Medications: Patient's Medications  New Prescriptions   No medications on file  Previous Medications   ACETAMINOPHEN (TYLENOL) 325 MG TABLET    Take 2 tablets (650 mg total) by mouth every 6 (six) hours as needed for mild pain (or Fever >/= 101).   BUPROPION (WELLBUTRIN XL) 150 MG 24 HR TABLET    Take 3 tablets (450 mg total) by mouth daily.   CHOLECALCIFEROL (VITAMIN D) 1000 UNITS TABLET    Take 1,000 Units by mouth  daily.   DONEPEZIL (ARICEPT) 10 MG TABLET    Take one tablet by mouth at bedtime to preserve memory   FUROSEMIDE (LASIX) 40 MG TABLET    Take one (1) tablet (40 mg total) by mouth every morning and and half (1/2) tablet (20 mg total) by mouth every evening.   LEVOTHYROXINE (SYNTHROID, LEVOTHROID) 100 MCG TABLET    Take  1 tablet (100 mcg total) by mouth daily before breakfast.   MAGNESIUM 400 MG CAPS    Take 400 mg by mouth daily.   METOPROLOL TARTRATE (LOPRESSOR) 25 MG TABLET    Take 1 tablet (25 mg total) by mouth every morning and  take half (1/2) tablet  (12.5 mg total) by mouth  every evening.   OLMESARTAN (BENICAR) 40 MG TABLET    Take 1 tablet by mouth  every morning for blood  pressure   OMEGA-3 FATTY ACIDS (FISH OIL) 1200 MG CAPS    Take 1,200 mg by mouth every morning.   OMEPRAZOLE (PRILOSEC) 40 MG CAPSULE    Take 1 capsule by mouth  daily for stomach   OXCARBAZEPINE (TRILEPTAL) 300 MG TABLET    Take 300 mg by mouth every morning.    POTASSIUM CHLORIDE (K-DUR,KLOR-CON) 10 MEQ TABLET    Take 1 tablet by mouth  daily   SERTRALINE (ZOLOFT) 100 MG TABLET    Take 200 mg by mouth every morning.    SPIRONOLACTONE (ALDACTONE) 25 MG TABLET    Take 1 tablet by mouth  every morning   TIZANIDINE (ZANAFLEX) 4 MG TABLET    Take 1 tablet (4 mg total) by mouth every 6 (six) hours as needed for muscle spasms.   VITAMIN E 1000 UNIT CAPSULE    Take 1,000 Units by mouth daily.  Modified Medications   No medications on file  Discontinued Medications   No medications on file    Review of Systems  Unable to perform ROS: Dementia    Vitals:   11/30/15 1404  BP: 102/62  Pulse: 60  Temp: 97.3 F (36.3 C)  TempSrc: Oral  SpO2: 97%  Weight: 128 lb (58.1 kg)  Height: 4\' 11"  (1.499 m)   Body mass index is 25.85 kg/m.  Physical Exam  Constitutional: She appears well-developed.  Frail appearing in NAD  HENT:  Mouth/Throat: Oropharynx is clear and moist. No oropharyngeal exudate.  TMs dull but no  air fluid level or redness. No bulging  Eyes: Pupils are equal, round, and reactive to light. No scleral icterus.  Neck: Neck supple. Carotid bruit is not present. No tracheal deviation present. No thyromegaly present.  Cardiovascular: Normal rate, regular rhythm and intact distal pulses.  Exam reveals no gallop and no friction rub.   Murmur (1/6 SEM) heard. No LE edema b/l. no calf TTP.   Pulmonary/Chest: Effort normal and breath sounds normal. No stridor. No respiratory distress. She has no wheezes. She has no rales.  Palpable ACW AICD  Abdominal: Soft. Bowel sounds are normal. She exhibits no distension and no mass. There is no hepatomegaly. There is no tenderness. There is no rebound and no guarding.  Genitourinary:  Genitourinary Comments: Good rectal tone, no palpable mass, no fissure. No external or internal hemorrhoids noted  Musculoskeletal: She exhibits edema (left lateral wrist swelling and TTP at radius with reduced ROM ) and tenderness.  Lymphadenopathy:    She has no cervical adenopathy.  Neurological: She is alert. She displays tremor (resting).  Skin: Skin is warm and dry. No purpura and no rash noted.  Psychiatric: She has a normal mood and affect. Her behavior is normal.     Labs reviewed: Clinical Support on 11/10/2015  Component Date Value Ref Range Status  . Date Time Interrogation Session 11/29/2015 [id number]   Final  . Pulse Generator Manufacturer 11/29/2015 BOST   Final  . Pulse Gen Model 11/29/2015 N141 ENERGEN CRT-D   Final  .  Pulse Gen Serial Number 11/29/2015 [id number]   Final  . Implantable Pulse Generator Type 11/29/2015 Cardiac Resynch Therapy Defibulator   Final  . Implantable Pulse Generator Implan* 11/29/2015 [id number]   Final  . Implantable Lead Manufacturer 11/29/2015 GUIC   Final  . Implantable Lead Model 11/29/2015 0157   Final  . Implantable Lead Serial Number 11/29/2015 [id number]   Final  . Implantable Lead Implant Date 11/29/2015  [id number]   Final  . Implantable Lead Location 11/29/2015 [id number]   Final  . Implantable Lead Manufacturer 11/29/2015 GUIC   Final  . Implantable Lead Model 11/29/2015 4469   Final  . Implantable Lead Serial Number 11/29/2015 [id number]   Final  . Implantable Lead Implant Date 11/29/2015 [id number]   Final  . Implantable Lead Location 11/29/2015 [id number]   Final  . Implantable Lead Manufacturer 11/29/2015 GUIC   Final  . Implantable Lead Model 11/29/2015 4555   Final  . Implantable Lead Serial Number 11/29/2015 [id number]   Final  . Implantable Lead Implant Date 11/29/2015 [id number]   Final  . Implantable Lead Location 11/29/2015 [id number]   Final  . Lead Channel Setting Sensing Sensi* 11/29/2015 0.6  mV Final  . Lead Channel Setting Sensing Adapt* 11/29/2015 Adaptive Sensing   Final  . Lead Channel Setting Sensing Sensi* 11/29/2015 1.0  mV Final  . Lead Channel Setting Sensing Adapt* 11/29/2015 Adaptive Sensing   Final  . Lead Channel Setting Pacing Amplit* 11/29/2015 2.0  V Final  . Lead Channel Setting Pacing Pulse * 11/29/2015 0.4  ms Final  . Lead Channel Setting Pacing Amplit* 11/29/2015 2.0  V Final  . Lead Channel Setting Pacing Pulse * 11/29/2015 0.8  ms Final  . Lead Channel Setting Pacing Amplit* 11/29/2015 2.4  V Final  . Lead Channel Impedance Value 11/29/2015 728  ohm Final  . Lead Channel Pacing Threshold Ampl* 11/29/2015 1.0  V Final  . Lead Channel Pacing Threshold Puls* 11/29/2015 0.8  ms Final  . Lead Channel Impedance Value 11/29/2015 419  ohm Final  . Lead Channel Pacing Threshold Ampl* 11/29/2015 0.7  V Final  . Lead Channel Pacing Threshold Puls* 11/29/2015 0.4  ms Final  . Lead Channel Impedance Value 11/29/2015 584  ohm Final  . Lead Channel Pacing Threshold Ampl* 11/29/2015 0.8  V Final  . Lead Channel Pacing Threshold Puls* 11/29/2015 0.4  ms Final  . HighPow Impedance 11/29/2015 51  ohm Final  . Battery Status 11/29/2015 BOS   Final  . Battery Remaining Longevity 11/29/2015  96  mo Final  . Battery Remaining Percentage 11/29/2015 100  % Final  . Brady Statistic RA Percent Paced 11/29/2015 2  % Final  . Brady Statistic RV Percent Paced 11/29/2015 91  % Final  . Eval Rhythm 11/29/2015 AsBp w/PACs   Final  Office Visit on 10/05/2015  Component Date Value Ref Range Status  . Glucose 10/05/2015 99  65 - 99 mg/dL Final  . BUN 10/05/2015 25  8 - 27 mg/dL Final  . Creatinine, Ser 10/05/2015 0.86  0.57 - 1.00 mg/dL Final  . GFR calc non Af Amer 10/05/2015 64  >59 mL/min/1.73 Final  . GFR calc Af Amer 10/05/2015 74  >59 mL/min/1.73 Final  . BUN/Creatinine Ratio 10/05/2015 29* 12 - 28 Final  . Sodium 10/05/2015 139  134 - 144 mmol/L Final  . Potassium 10/05/2015 4.3  3.5 - 5.2 mmol/L Final  . Chloride 10/05/2015 104  96 - 106 mmol/L Final  . CO2 10/05/2015 30*  18 - 29 mmol/L Final  . Calcium 10/05/2015 10.8* 8.7 - 10.3 mg/dL Final  . Total Protein 10/05/2015 6.4  6.0 - 8.5 g/dL Final  . Albumin 10/05/2015 4.3  3.5 - 4.8 g/dL Final  . Globulin, Total 10/05/2015 2.1  1.5 - 4.5 g/dL Final  . Albumin/Globulin Ratio 10/05/2015 2.0  1.2 - 2.2 Final  . Bilirubin Total 10/05/2015 <0.2  0.0 - 1.2 mg/dL Final  . Alkaline Phosphatase 10/05/2015 100  39 - 117 IU/L Final  . AST 10/05/2015 32  0 - 40 IU/L Final  . ALT 10/05/2015 37* 0 - 32 IU/L Final  . Lipase 10/06/2015 29  0 - 59 U/L Final  . WBC 10/06/2015 8.1  3.4 - 10.8 x10E3/uL Final  . RBC 10/06/2015 4.53  3.77 - 5.28 x10E6/uL Final  . Hemoglobin 10/06/2015 12.7  11.1 - 15.9 g/dL Final  . Hematocrit 10/06/2015 39.4  34.0 - 46.6 % Final  . MCV 10/06/2015 87  79 - 97 fL Final  . MCH 10/06/2015 28.0  26.6 - 33.0 pg Final  . MCHC 10/06/2015 32.2  31.5 - 35.7 g/dL Final  . RDW 10/06/2015 15.3  12.3 - 15.4 % Final  . Platelets 10/06/2015 129* 150 - 379 x10E3/uL Final  . Neutrophils 10/06/2015 66  % Final  . Lymphs 10/06/2015 23  % Final  . Monocytes 10/06/2015 8  % Final  . Eos 10/06/2015 3  % Final  . Basos 10/06/2015 0   % Final  . Neutrophils Absolute 10/06/2015 5.3  1.4 - 7.0 x10E3/uL Final  . Lymphocytes Absolute 10/06/2015 1.9  0.7 - 3.1 x10E3/uL Final  . Monocytes Absolute 10/06/2015 0.6  0.1 - 0.9 x10E3/uL Final  . EOS (ABSOLUTE) 10/06/2015 0.3  0.0 - 0.4 x10E3/uL Final  . Basophils Absolute 10/06/2015 0.0  0.0 - 0.2 x10E3/uL Final  . Immature Granulocytes 10/06/2015 0  % Final  . Immature Grans (Abs) 10/06/2015 0.0  0.0 - 0.1 x10E3/uL Final    No results found.   Assessment/Plan   ICD-9-CM ICD-10-CM   1. Seasonal allergies 477.9 J30.2   2. Hypothyroidism due to acquired atrophy of thyroid 244.8 E03.8    246.8 E03.4   3. Depression 311 F32.9   4. Chronic systolic heart failure (HCC) 428.22 I50.22   5. Memory loss 780.93 R41.3 donepezil (ARICEPT) 10 MG tablet  6. Essential tremor 333.1 G25.0   7. ICD (implantable cardioverter-defibrillator), biventricular, in situ V45.02 Z95.810   8.      Rectal discomfort - reassurance given. Colonoscopy UTD  Continue current medications as ordered  Change positions slowly  F/u with specialists as scheduled  Recommend children's OTC allergy medication (zyrtec, claritin or allegra) daily  Follow up in 3 mos for CPE. Fasting labs prior to appt (bmp, alt, tsh, free t4, lipid panel)  Sharmain Lastra S. Perlie Gold  [facility] [address] [place], [place] [address] [phone] Cell (Monday-Friday 8 AM - 5 PM) [phone] After 5 PM and follow prompts

## 2015-12-02 ENCOUNTER — Encounter: Payer: Self-pay | Admitting: Cardiology

## 2015-12-09 ENCOUNTER — Other Ambulatory Visit: Payer: Self-pay

## 2015-12-09 DIAGNOSIS — I5022 Chronic systolic (congestive) heart failure: Secondary | ICD-10-CM

## 2015-12-09 DIAGNOSIS — D692 Other nonthrombocytopenic purpura: Secondary | ICD-10-CM

## 2015-12-09 DIAGNOSIS — I10 Essential (primary) hypertension: Secondary | ICD-10-CM

## 2015-12-09 DIAGNOSIS — E034 Atrophy of thyroid (acquired): Secondary | ICD-10-CM

## 2016-01-13 ENCOUNTER — Encounter: Payer: Self-pay | Admitting: Nurse Practitioner

## 2016-01-13 ENCOUNTER — Ambulatory Visit (INDEPENDENT_AMBULATORY_CARE_PROVIDER_SITE_OTHER): Payer: Medicare Other | Admitting: Nurse Practitioner

## 2016-01-13 VITALS — BP 121/69 | HR 66 | Ht 59.0 in | Wt 127.8 lb

## 2016-01-13 DIAGNOSIS — R413 Other amnesia: Secondary | ICD-10-CM | POA: Diagnosis not present

## 2016-01-13 NOTE — Patient Instructions (Signed)
Memory score is stable Exercise by walking daily for overall health and well-being Continue Aricept ordered by primary care Follow-up in 6 months for repeat memory testing next with Dr. Krista Blue

## 2016-01-13 NOTE — Progress Notes (Signed)
[facility]  PATIENT: [name] DOB: 1933-08-27   REASON FOR VISIT: Follow-up for memory loss, depression HISTORY FROM: Patient    HISTORY OF PRESENT ILLNESS: HISTORY [name] is a 82 yo RH AAF referred by her primary care Dr. Baird Cancer for evaluation of memory trouble, drive herself to office alone at today's clinical visit. She is a retired Writer in 2003, she retired at age 36 because of her right knee pain, she had right knee replacement, in 2004, she also had lumbar decompression surgery by Dr. Nicholes Calamity under general anesthesia, woke up from surgery, she noticed mild memory trouble, she has short-term memory trouble, has been persistent since then, she denies difficulty talking, no strokelike symptoms then. She lives at home with her family, highly functional, driving, independent at daily activity, able to keep her check in balance, She suffered long-standing history of bipolar disorder, on polypharmacy treatment, this including Trileptal 300 mg daily, Zoloft 100 mg a day, Wellbutrin 150 mg 3 tablets a day, She had accident of sudden onset dizziness couple days ago, after dinner, she felt lightheaded, has to crawling upstairs, was helped by her husband to get up, then she fell to the ground, whole-body shaking, no loss of consciousness. She has baseline mild gait difficulty due to her low back pain, bilateral knee pain, She denied a family history of dementia, CT head in 2013, Unchanged mild atrophy and microvascular ischemic disease without acute intracranial process. She had a history of chronic systolic heart failure, left bundle branch block, status post biventricular ICD insertion in 2009, underwent device generator change out Sept 2014. S She presents for one year evaluation. She reports feeling dizzy Tuesday night with improvement by Wednesday morning. She also reports upper body "shaking". The only pain she has is from  arthritis. She gets some mild edema in her right ankle. She reports her blood pressure at home usually runs 110-120/60-70. Sometimes when she gets up quickly, she felt lightheaded, UPDATE April 8th 2016:YYShe is overall doing very well, only has occasionally dizziness, especially when she gets up quickly, today's Mini-Mental status examination is 29 out of 30 CAT scan of the brain showed mild small vessel disease, no acute lesions, EEG showed mild slowing UPDATE: 10/13/2016CM [name], 82 year old female returns for follow-up. She has a history of memory loss. Her Mini-Mental Status exam today is 28 out of 30. She continues to cook without difficulty she continues to drive without difficulty, not getting lost in familiar places. She returns for reevaluation. She gets very little exercise due to recent knee replacement UPDATE 07/11/2015 CM [name], 82 year old female returns for follow-up. She has a history of memory loss and her Mini-Mental Status exam score is 30 over 30 today. She was placed on low-dose Aricept by her primary care provider 2 months ago. She says "I feel the fog  is lifted" she continues to cook and she continues to drive without getting lost. She is alone at today's visit.  UPDATE 10/6/17CMMs [name], 82 year old female returns for follow-up for her memory loss. She is on Aricept 10 mg daily which has been prescribed by her primary care. She states she thinks her memory is better. She continues to perform all activities of daily living she continues to cook and do housework. She drives a car without difficulty. She has not gotten lost. CT of the head 06/17/2014 shows  mild age-related changes of chronic microvascular ischemia and generalized cerebral atrophy, otherwise no change when compared to  CT dated April 2013. This was reviewed with the patient. She returns for reevaluation today alone at visit.    REVIEW OF SYSTEMS: Full 14 system review of systems performed and notable only  for those listed, all others are neg:  Constitutional: neg  Cardiovascular: neg Ear/Nose/Throat: neg  Skin: neg Eyes: neg Respiratory: neg Gastroitestinal: neg  Hematology/Lymphatic: neg  Endocrine: neg Musculoskeletal: Arthritis  Allergy/Immunology: Environmental allergies Neurological: Memory loss Psychiatric: Depression and anxiety followed by psychiatry Sleep : neg   ALLERGIES: Allergies  Allergen Reactions  . Iodine Shortness Of Breath    Iodine contrast, CHF , SOB  . Shellfish Allergy Shortness Of Breath  . Aspirin Nausea And Vomiting  . Codeine Nausea And Vomiting    HOME MEDICATIONS: Outpatient Medications Prior to Visit  Medication Sig Dispense Refill  . acetaminophen (TYLENOL) 325 MG tablet Take 2 tablets (650 mg total) by mouth every 6 (six) hours as needed for mild pain (or Fever >/= 101).    Marland Kitchen buPROPion (WELLBUTRIN XL) 150 MG 24 hr tablet Take 3 tablets (450 mg total) by mouth daily. 270 tablet 3  . cholecalciferol (VITAMIN D) 1000 UNITS tablet Take 1,000 Units by mouth daily.    Marland Kitchen donepezil (ARICEPT) 10 MG tablet Take one tablet by mouth at bedtime to preserve memory 90 tablet 1  . furosemide (LASIX) 40 MG tablet Take one (1) tablet (40 mg total) by mouth every morning and and half (1/2) tablet (20 mg total) by mouth every evening. 135 tablet 3  . levothyroxine (SYNTHROID, LEVOTHROID) 100 MCG tablet Take 1 tablet (100 mcg total) by mouth daily before breakfast. 90 tablet 3  . Magnesium 400 MG CAPS Take 400 mg by mouth daily.    . metoprolol tartrate (LOPRESSOR) 25 MG tablet Take 1 tablet (25 mg total) by mouth every morning and  take half (1/2) tablet  (12.5 mg total) by mouth  every evening. 135 tablet 3  . olmesartan (BENICAR) 40 MG tablet Take 1 tablet by mouth  every morning for blood  pressure 90 tablet 2  . Omega-3 Fatty Acids (FISH OIL) 1200 MG CAPS Take 1,200 mg by mouth every morning.    Marland Kitchen omeprazole (PRILOSEC) 40 MG capsule Take 1 capsule by mouth   daily for stomach 90 capsule 1  . Oxcarbazepine (TRILEPTAL) 300 MG tablet Take 300 mg by mouth every morning.     . potassium chloride (K-DUR,KLOR-CON) 10 MEQ tablet Take 1 tablet by mouth  daily 90 tablet 2  . sertraline (ZOLOFT) 100 MG tablet Take 200 mg by mouth every morning.     Marland Kitchen spironolactone (ALDACTONE) 25 MG tablet Take 1 tablet by mouth  every morning 90 tablet 1  . tiZANidine (ZANAFLEX) 4 MG tablet Take 1 tablet (4 mg total) by mouth every 6 (six) hours as needed for muscle spasms. 60 tablet 0  . vitamin E 1000 UNIT capsule Take 1,000 Units by mouth daily.     No facility-administered medications prior to visit.     PAST MEDICAL HISTORY: Past Medical History:  Diagnosis Date  . Arthritis   . Cancer [facility])    left breast cancer   . CHF (congestive heart failure) ([place])    PACEMAKER & DEFIB  . Complication of anesthesia    hypotensive after back surgery in 2006- reports on chart   . Depression   . Dyslipidemia   . Fainted 04/21/06   AT [place]  . GERD (gastroesophageal reflux disease)   . Headache(784.0)   .  Hearing loss   . HLD (hyperlipidemia)   . Hypertension   . Hypothyroidism   . ICD (implantable cardiac defibrillator) in place    pt has pacer/icd  . ICD (implantable cardiac defibrillator), biventricular, in situ   . LBBB (left bundle branch block)   . Memory loss   . Nonischemic cardiomyopathy ([place])   . Normal coronary arteries    s/p cardiac cath 2007  . Pacemaker    Guidant Device  . Syncope   . Systolic CHF ([place])   . Vertigo   . Wears glasses     PAST SURGICAL HISTORY: Past Surgical History:  Procedure Laterality Date  . BACK SURGERY     lumbar fusion   . BREAST SURGERY  2000   LUMP REMOVAL. STAGE 1 CANCER  . CARDIAC CATHETERIZATION    . CATARACT EXTRACTION    . EYE SURGERY    . IMPLANTABLE CARDIOVERTER DEFIBRILLATOR GENERATOR CHANGE N/A 12/18/2012   Procedure: IMPLANTABLE CARDIOVERTER DEFIBRILLATOR GENERATOR CHANGE;  Surgeon: [name],  MD;  Location: [facility] CATH LAB;  Service: Cardiovascular;  Laterality: N/A;  . JOINT REPLACEMENT  06/14/01   right  . LUMBAR FUSION  2006  . MASS EXCISION  11/08/2011   Procedure: EXCISION MASS;  Surgeon: [name], MD;  Location: [facility] ORS;  Service: General;  Laterality: Left;  Excision Left Thigh Mass  . MASTECTOMY, PARTIAL  2008   GOT PACEMAKER AND DEFIB AT THAT TIME  . PACEMAKER INSERTION  04/23/06  . TOTAL KNEE ARTHROPLASTY  05/17/01   RIGHT KNEE  . TOTAL KNEE ARTHROPLASTY Left 11/29/2014   Procedure: TOTAL LEFT KNEE ARTHROPLASTY;  Surgeon: Paralee Cancel, MD;  Location: [facility] ORS;  Service: Orthopedics;  Laterality: Left;    FAMILY HISTORY: Family History  Problem Relation Age of Onset  . Hypertension Mother   . Arthritis Mother   . Hypertension Father   . Hypertension Brother   . Hypertension Brother     SOCIAL HISTORY: Social History   Social History  . Marital status: Married    Spouse name: N/A  . Number of children: 1  . Years of education: Masters   Occupational History  . Retired    Social History Main Topics  . Smoking status: Never Smoker  . Smokeless tobacco: Never Used  . Alcohol use No  . Drug use: No  . Sexual activity: Not Currently    Birth control/ protection: Post-menopausal   Other Topics Concern  . Not on file   Social History Narrative   Lives at home with husband.   Right-handed.      As of 07/28/2014   Diet: No special diet   Caffeine: yes, Chocolate, tea and sodas    Married: YES, 1970   House: Yes, 2 stories, 2-3 persons live in home   Pets: No   Current/Past profession: Engineer, mining, Nurse Practitioner    Exercise: Yes 2-3 x weekly   Living Will: Yes   DNR: No   POA/HPOA: No           PHYSICAL EXAM  Vitals:   01/13/16 0940  BP: 121/69  Pulse: 66  Weight: 127 lb 12.8 oz (58 kg)  Height: 4\' 11"  (1.499 m)   Body mass index is 25.81 kg/m. Generalized: Well developed, in no acute distress  Head: normocephalic and atraumatic,.  Oropharynx benign  Neck: Supple, no carotid bruits  Cardiac: Regular rate rhythm, no murmur  Musculoskeletal: No deformity   Neurological examination   Mentation: MMSE -  Mini Mental State Exam 01/13/2016 01/20/2015 07/28/2014  Orientation to time 5 4 5   Orientation to Place 5 5 4   Registration 3 3 3   Attention/ Calculation 5 5 5   Recall 2 2 1   Language- name 2 objects 2 2 2   Language- repeat 1 1 1   Language- follow 3 step command 3 2 3   Language- read & follow direction 1 0 1  Write a sentence 1 1 1   Copy design 1 1 1   Total score 29 26 27    Alert oriented to time, place, history taking. Attention span and concentration appropriate. Follows all commands speech and language fluent. AFT 10, clock drawing 4/4.  Cranial nerve II-XII: Pupils were equal round reactive to light extraocular movements were full, visual field were full on confrontational test. Facial sensation and strength were normal. hearing was intact to finger rubbing bilaterally. Uvula tongue midline. head turning and shoulder shrug were normal and symmetric.Tongue protrusion into cheek strength was normal. Motor: normal bulk and tone, full strength in the BUE, BLE, fine finger movements normal, no pronator drift. No focal weakness Sensory: normal and symmetric to light touch, pinprick, and Vibration,  Coordination: finger-nose-finger, heel-to-shin bilaterally, no dysmetria Reflexes: Brachioradialis 2/2, biceps 2/2, triceps 2/2, patellar 2/2, Achilles 2/2, plantar responses were flexor bilaterally. Gait and Station: Rising up from seated position without assistance, cautious gait , no difficulty with turns, no assistive device   DIAGNOSTIC DATA (LABS, IMAGING, TESTING) - I reviewed patient records, labs, notes, testing and imaging myself where available.      Component Value Date/Time   NA 139 10/05/2015 0944   K 4.3 10/05/2015 0944   CL 104 10/05/2015 0944   CO2 30 (H) 10/05/2015 0944   GLUCOSE 99 10/05/2015  0944   GLUCOSE 112 (H) 12/01/2014 0446   BUN 25 10/05/2015 0944   CREATININE 0.86 10/05/2015 0944   CALCIUM 10.8 (H) 10/05/2015 0944   PROT 6.4 10/05/2015 0944   ALBUMIN 4.3 10/05/2015 0944   AST 32 10/05/2015 0944   ALT 37 (H) 10/05/2015 0944   ALKPHOS 100 10/05/2015 0944   BILITOT <0.2 10/05/2015 0944   GFRNONAA 64 10/05/2015 0944   GFRAA 74 10/05/2015 0944   Lab Results  Component Value Date   CHOL 190 04/13/2015   HDL 56 04/13/2015   LDLCALC 101 (H) 04/13/2015   TRIG 164 (H) 04/13/2015   CHOLHDL 3.4 04/13/2015    Lab Results  Component Value Date   TSH 1.730 08/19/2015      ASSESSMENT AND PLAN 82 y.o. year old female has a past medical history of gradual onset of memory loss and history of depression and anxiety and polypharmacy treatment. CAT scan shows mild atrophy small vessel disease no acute lesions. Her memory score is stable at 29/30.      Exercise by walking daily for overall health and well-being Continue Aricept ordered by primary care currently taking 10mg   Follow-up in 6 months for repeat memory testing, next with Dr. Luan Pulling, [facility], [facility], [facility] [address] [place], [place] [address] [phone]

## 2016-01-16 NOTE — Progress Notes (Signed)
I have reviewed and agreed above plan. 

## 2016-02-07 ENCOUNTER — Encounter: Payer: Self-pay | Admitting: Internal Medicine

## 2016-02-09 ENCOUNTER — Ambulatory Visit (INDEPENDENT_AMBULATORY_CARE_PROVIDER_SITE_OTHER): Payer: Medicare Other | Admitting: *Deleted

## 2016-02-09 DIAGNOSIS — I428 Other cardiomyopathies: Secondary | ICD-10-CM | POA: Diagnosis not present

## 2016-02-09 NOTE — Progress Notes (Signed)
Remote ICD transmission.   

## 2016-02-10 ENCOUNTER — Other Ambulatory Visit: Payer: Self-pay | Admitting: Internal Medicine

## 2016-02-10 DIAGNOSIS — R413 Other amnesia: Secondary | ICD-10-CM

## 2016-02-15 ENCOUNTER — Encounter: Payer: Self-pay | Admitting: Cardiology

## 2016-02-21 ENCOUNTER — Other Ambulatory Visit: Payer: Self-pay | Admitting: Internal Medicine

## 2016-02-27 ENCOUNTER — Other Ambulatory Visit: Payer: Medicare Other

## 2016-02-27 DIAGNOSIS — I5022 Chronic systolic (congestive) heart failure: Secondary | ICD-10-CM

## 2016-02-27 DIAGNOSIS — I10 Essential (primary) hypertension: Secondary | ICD-10-CM

## 2016-02-27 DIAGNOSIS — D692 Other nonthrombocytopenic purpura: Secondary | ICD-10-CM

## 2016-02-27 DIAGNOSIS — E034 Atrophy of thyroid (acquired): Secondary | ICD-10-CM

## 2016-02-27 LAB — BASIC METABOLIC PANEL WITH GFR
BUN: 19 mg/dL (ref 7–25)
CO2: 30 mmol/L (ref 20–31)
Calcium: 9.7 mg/dL (ref 8.6–10.4)
Chloride: 106 mmol/L (ref 98–110)
Creat: 0.76 mg/dL (ref 0.60–0.88)
GFR, Est African American: 86 mL/min (ref >=60)
GFR, Est Non African American: 74 mL/min (ref >=60)
Glucose, Bld: 82 mg/dL (ref 65–99)
Potassium: 4.2 mmol/L (ref 3.5–5.3)
Sodium: 139 mmol/L (ref 135–146)

## 2016-02-27 LAB — LIPID PANEL
Cholesterol: 167 mg/dL (ref <200)
HDL: 66 mg/dL (ref >50)
LDL Cholesterol: 84 mg/dL (ref <100)
Total CHOL/HDL Ratio: 2.5 Ratio (ref <5.0)
Triglycerides: 84 mg/dL (ref <150)
VLDL: 17 mg/dL (ref <30)

## 2016-02-27 LAB — ALT: ALT: 25 U/L (ref 6–29)

## 2016-02-27 LAB — T4, FREE: Free T4: 1 ng/dL (ref 0.8–1.8)

## 2016-02-27 LAB — TSH: TSH: 1.2 mIU/L

## 2016-02-28 ENCOUNTER — Ambulatory Visit (INDEPENDENT_AMBULATORY_CARE_PROVIDER_SITE_OTHER): Payer: Medicare Other | Admitting: Nurse Practitioner

## 2016-02-28 ENCOUNTER — Encounter: Payer: Self-pay | Admitting: Nurse Practitioner

## 2016-02-28 VITALS — BP 116/72 | HR 61 | Temp 98.1°F | Resp 14 | Ht 58.5 in | Wt 127.0 lb

## 2016-02-28 DIAGNOSIS — Z0000 Encounter for general adult medical examination without abnormal findings: Secondary | ICD-10-CM

## 2016-02-28 DIAGNOSIS — R413 Other amnesia: Secondary | ICD-10-CM

## 2016-02-28 DIAGNOSIS — N644 Mastodynia: Secondary | ICD-10-CM

## 2016-02-28 DIAGNOSIS — Z23 Encounter for immunization: Secondary | ICD-10-CM | POA: Diagnosis not present

## 2016-02-28 DIAGNOSIS — E2839 Other primary ovarian failure: Secondary | ICD-10-CM

## 2016-02-28 DIAGNOSIS — F3341 Major depressive disorder, recurrent, in partial remission: Secondary | ICD-10-CM | POA: Diagnosis not present

## 2016-02-28 DIAGNOSIS — I5022 Chronic systolic (congestive) heart failure: Secondary | ICD-10-CM

## 2016-02-28 DIAGNOSIS — E034 Atrophy of thyroid (acquired): Secondary | ICD-10-CM | POA: Diagnosis not present

## 2016-02-28 DIAGNOSIS — I10 Essential (primary) hypertension: Secondary | ICD-10-CM

## 2016-02-28 NOTE — Progress Notes (Signed)
Provider: [name], DO  Patient Care Team: [name], DO as PCP - General (Internal Medicine) [name], MD as Consulting Physician (General Surgery) Paralee Cancel, MD as Consulting Physician (Orthopedic Surgery) Marica Otter, Tate (Optometry) Marcial Pacas, MD as Consulting Physician (Neurology) [name], MD as Consulting Physician (Cardiology)  Extended Emergency Contact Information Primary Emergency Contact: Kreisler,Basil Address: [address]          [place], [place] [address] [place] Phone: [phone] Relation: Spouse Secondary Emergency Contact: Newton,Doris Address: [address]          [place], [place] [address] [place] Phone: [phone] Relation: Other Allergies  Allergen Reactions  . Iodine Shortness Of Breath    Iodine contrast, CHF , SOB  . Shellfish Allergy Shortness Of Breath  . Aspirin Nausea And Vomiting  . Codeine Nausea And Vomiting   Code Status: DNR Goals of Care: Advanced Directive information Advanced Directives 11/30/2015  Does Patient Have a Medical Advance Directive? Yes  Type of Advance Directive Living will;Healthcare Power of Attorney  Does patient want to make changes to medical advance directive? No - Patient declined  Copy of [place] in Chart? No - copy requested  Pre-existing out of facility DNR order (yellow form or pink MOST form) -     Chief Complaint  Patient presents with  . Annual Exam    Extended visit for wellness, EKG completed by cardiologist, MMSE completed 01/2016(29/30).   . Orders    Bone Density due  . Immunizations    Up to date on immunizations    HPI: Patient is a 82 y.o. female seen in today for an annual wellness exam.   No major illnesses or hospitalization in the last year   Depression screen [facility] 2/9 02/28/2016 10/05/2015 08/19/2015 11/26/2014 07/28/2014  Decreased Interest 0 1 0 0 0  Down, Depressed, Hopeless 0 1 0 0 0  PHQ -  2 Score 0 2 0 0 0    Fall Risk  02/28/2016 11/30/2015 10/05/2015 08/19/2015 05/20/2015  Falls in the past year? Yes Yes Yes No No  Number falls in past yr: 1 1 1  - -  Injury with Fall? Yes Yes No - -   MMSE - Mini Mental State Exam 01/13/2016 01/20/2015 07/28/2014 07/16/2014 06/09/2014  Orientation to time 5 4 5 5 5   Orientation to Place 5 5 4 5 5   Registration 3 3 3 3 3   Attention/ Calculation 5 5 5 5 5   Recall 2 2 1 2 1   Language- name 2 objects 2 2 2 2 2   Language- repeat 1 1 1 1 1   Language- follow 3 step command 3 2 3 3 3   Language- read & follow direction 1 0 1 1 1   Write a sentence 1 1 1 1 1   Copy design 1 1 1 1 1   Total score 29 26 27 29 28      Health Maintenance  Topic Date Due  . DEXA SCAN  02/13/2001  . PNA vac Low Risk Adult (2 of 2 - PPSV23) 04/09/2013  . INFLUENZA VACCINE  11/08/2015  . MAMMOGRAM  03/01/2016  . TETANUS/TDAP  01/08/2023  . ZOSTAVAX  Completed   States she got influenza vaccine at the [facility] in September.  Urinary incontinence? Functional Status Survey: Is the patient deaf or have difficulty hearing?: Yes (has hearing aids does not wear all the time) Does the patient have difficulty seeing, even when  wearing glasses/contacts?: No Does the patient have difficulty concentrating, remembering, or making decisions?: Yes Does the patient have difficulty walking or climbing stairs?: No Does the patient have difficulty dressing or bathing?: No Does the patient have difficulty doing errands alone such as visiting a doctor's office or shopping?: No Current Exercise Habits: Home exercise routine;Structured exercise class, Time (Minutes): 30, Frequency (Times/Week): 4, Weekly Exercise (Minutes/Week): 120, Intensity: Moderate   Diet? No special diet.   Vision Screening Comments: 05/11/2015 Dr.Sally Sabra Heck    Dentition: every 6 months goes to dentist   Pain: in her knees and back, 6/10, takes 2 tylenol for pain in the morning. Does not help through the day but  does not wish to take any other medication for pain.    Past Medical History:  Diagnosis Date  . Arthritis   . Cancer [facility])    left breast cancer   . CHF (congestive heart failure) ([place])    PACEMAKER & DEFIB  . Complication of anesthesia    hypotensive after back surgery in 2006- reports on chart   . Depression   . Dyslipidemia   . Fainted 04/21/06   AT [place]  . GERD (gastroesophageal reflux disease)   . Headache(784.0)   . Hearing loss   . HLD (hyperlipidemia)   . Hypertension   . Hypothyroidism   . ICD (implantable cardiac defibrillator) in place    pt has pacer/icd  . ICD (implantable cardiac defibrillator), biventricular, in situ   . LBBB (left bundle branch block)   . Memory loss   . Nonischemic cardiomyopathy ([place])   . Normal coronary arteries    s/p cardiac cath 2007  . Pacemaker    Guidant Device  . Syncope   . Systolic CHF ([place])   . Vertigo   . Wears glasses     Past Surgical History:  Procedure Laterality Date  . BACK SURGERY     lumbar fusion   . BREAST SURGERY  2000   LUMP REMOVAL. STAGE 1 CANCER  . CARDIAC CATHETERIZATION    . CATARACT EXTRACTION    . EYE SURGERY    . IMPLANTABLE CARDIOVERTER DEFIBRILLATOR GENERATOR CHANGE N/A 12/18/2012   Procedure: IMPLANTABLE CARDIOVERTER DEFIBRILLATOR GENERATOR CHANGE;  Surgeon: [name], MD;  [facility];  Service: Cardiovascular;  Laterality: N/A;  . JOINT REPLACEMENT  06/14/01   right  . LUMBAR FUSION  2006  . MASS EXCISION  11/08/2011   Procedure: EXCISION MASS;  Surgeon: [name], MD;  Location: [facility] ORS;  Service: General;  Laterality: Left;  Excision Left Thigh Mass  . MASTECTOMY, PARTIAL  2008   GOT PACEMAKER AND DEFIB AT THAT TIME  . PACEMAKER INSERTION  04/23/06  . TOTAL KNEE ARTHROPLASTY  05/17/01   RIGHT KNEE  . TOTAL KNEE ARTHROPLASTY Left 11/29/2014   Procedure: TOTAL LEFT KNEE ARTHROPLASTY;  Surgeon: Paralee Cancel, MD;  Location: [facility] ORS;  Service: Orthopedics;  Laterality: Left;     Social History   Social History  . Marital status: Married    Spouse name: N/A  . Number of children: 1  . Years of education: Masters   Occupational History  . Retired    Social History Main Topics  . Smoking status: Never Smoker  . Smokeless tobacco: Never Used  . Alcohol use No  . Drug use: No  . Sexual activity: Not Currently    Birth control/ protection: Post-menopausal   Other Topics Concern  . None   Social History Narrative  Lives at home with husband.   Right-handed.      As of 07/28/2014   Diet: No special diet   Caffeine: yes, Chocolate, tea and sodas    Married: YES, 1970   House: Yes, 2 stories, 2-3 persons live in home   Pets: No   Current/Past profession: Engineer, mining, Designer, jewellery    Exercise: Yes 2-3 x weekly   Living Will: Yes   DNR: No   POA/HPOA: No          Family History  Problem Relation Age of Onset  . Hypertension Mother   . Arthritis Mother   . Hypertension Father   . Hypertension Brother   . Hypertension Brother     Review of Systems:  Review of Systems  Constitutional: Negative for chills and fever.  HENT: Negative for sore throat and tinnitus.   Respiratory: Negative for cough, shortness of breath and wheezing.   Cardiovascular: Negative for chest pain, palpitations and leg swelling.  Gastrointestinal: Negative for abdominal pain, blood in stool, constipation, diarrhea, nausea and vomiting.  Genitourinary: Negative for dysuria, frequency, hematuria and urgency.       No urinary/bowel incontinence  Musculoskeletal: Positive for back pain and gait problem. Negative for myalgias.  Skin: Negative for rash.  Allergic/Immunologic: Negative for environmental allergies.  Neurological: Negative for dizziness, tremors, seizures, weakness and headaches.  Hematological: Does not bruise/bleed easily.  Psychiatric/Behavioral: The patient is not nervous/anxious.       Medication List       Accurate as of 02/28/16  1:54  PM. Always use your most recent med list.          acetaminophen 325 MG tablet Commonly known as:  TYLENOL Take 2 tablets (650 mg total) by mouth every 6 (six) hours as needed for mild pain (or Fever >/= 101).   buPROPion 150 MG 24 hr tablet Commonly known as:  WELLBUTRIN XL Take 3 tablets (450 mg total) by mouth daily.   cholecalciferol 1000 units tablet Commonly known as:  VITAMIN D Take 1,000 Units by mouth daily.   donepezil 10 MG tablet Commonly known as:  ARICEPT TAKE ONE TABLET BY MOUTH AT BEDTIME TO PRESERVE MEMORY   Fish Oil 1200 MG Caps Take 1,200 mg by mouth every morning.   furosemide 40 MG tablet Commonly known as:  LASIX Take one (1) tablet (40 mg total) by mouth every morning and and half (1/2) tablet (20 mg total) by mouth every evening.   levothyroxine 100 MCG tablet Commonly known as:  SYNTHROID, LEVOTHROID Take 1 tablet (100 mcg total) by mouth daily before breakfast.   Magnesium 400 MG Caps Take 400 mg by mouth daily.   metoprolol tartrate 25 MG tablet Commonly known as:  LOPRESSOR Take 1 tablet (25 mg total) by mouth every morning and  take half (1/2) tablet  (12.5 mg total) by mouth  every evening.   olmesartan 40 MG tablet Commonly known as:  BENICAR Take 1 tablet by mouth  every morning for blood  pressure   omeprazole 40 MG capsule Commonly known as:  PRILOSEC TAKE 1 CAPSULE BY MOUTH  DAILY FOR STOMACH   Oxcarbazepine 300 MG tablet Commonly known as:  TRILEPTAL Take 300 mg by mouth every morning.   potassium chloride 10 MEQ tablet Commonly known as:  K-DUR,KLOR-CON Take 1 tablet by mouth  daily   sertraline 100 MG tablet Commonly known as:  ZOLOFT Take 200 mg by mouth every morning.   spironolactone 25 MG  tablet Commonly known as:  ALDACTONE TAKE 1 TABLET BY MOUTH  EVERY MORNING   tiZANidine 4 MG tablet Commonly known as:  ZANAFLEX Take 1 tablet (4 mg total) by mouth every 6 (six) hours as needed for muscle spasms.   vitamin  E 1000 UNIT capsule Take 1,000 Units by mouth daily.         Physical Exam: Vitals:   02/28/16 1329  BP: 116/72  Pulse: 61  Resp: 14  Temp: 98.1 F (36.7 C)  TempSrc: Oral  SpO2: 96%  Weight: 127 lb (57.6 kg)  Height: 4' 10.5" (1.486 m)   Body mass index is 26.09 kg/m. Physical Exam  Constitutional: She appears well-developed.  Frail appearing in NAD  HENT:  Head: Normocephalic and atraumatic.  Right Ear: External ear normal.  Left Ear: External ear normal.  Mouth/Throat: Oropharynx is clear and moist. No oropharyngeal exudate.  TMs dull but no air fluid level or redness. No bulging  Eyes: Conjunctivae and EOM are normal. Pupils are equal, round, and reactive to light. No scleral icterus.  Neck: Neck supple. Carotid bruit is not present. No tracheal deviation present. No thyromegaly present.  Cardiovascular: Normal rate, regular rhythm and intact distal pulses.  Exam reveals no gallop and no friction rub.   Murmur (1/6 SEM) heard. Pulmonary/Chest: Effort normal and breath sounds normal. No stridor. No respiratory distress. She has no wheezes. She has no rales.  Palpable ACW AICD  Abdominal: Soft. Bowel sounds are normal. She exhibits no distension and no mass. There is no hepatomegaly. There is no tenderness. There is no rebound and no guarding.  Musculoskeletal: She exhibits no edema or tenderness.  Lymphadenopathy:    She has no cervical adenopathy.  Neurological: She is alert. She displays tremor (resting).  Skin: Skin is warm and dry. No purpura and no rash noted.  Psychiatric: She has a normal mood and affect. Her behavior is normal.    Labs reviewed: Basic Metabolic Panel:  Recent Labs  04/13/15 1140 08/19/15 1227 10/05/15 0944 02/27/16 1348  NA 140 146* 139 139  K 4.7 4.4 4.3 4.2  CL 101 107* 104 106  CO2 23 25 30* 30  GLUCOSE 76 71 99 82  BUN 24 29* 25 19  CREATININE 0.87 1.06* 0.86 0.76  CALCIUM 10.3 10.3 10.8* 9.7  TSH 1.460 1.730  --  1.20    Liver Function Tests:  Recent Labs  08/19/15 1227 10/05/15 0944 02/27/16 1348  AST  --  32  --   ALT 29 37* 25  ALKPHOS  --  100  --   BILITOT  --  <0.2  --   PROT  --  6.4  --   ALBUMIN  --  4.3  --     Recent Labs  10/05/15 0945  LIPASE 29   No results for input(s): AMMONIA in the last 8760 hours. CBC:  Recent Labs  08/19/15 1227 10/05/15 0945  WBC 9.1 8.1  NEUTROABS 6.1 5.3  HCT 40.8 39.4  MCV 89 87  PLT 145* 129*   Lipid Panel:  Recent Labs  04/13/15 1140 02/27/16 1348  CHOL 190 167  HDL 56 66  LDLCALC 101* 84  TRIG 164* 84  CHOLHDL 3.4 2.5   No results found for: HGBA1C  Procedures: No results found.  Assessment/Plan 1. Medicare annual wellness visit, subsequent The patient was counseled regarding the appropriate use of alcohol, regular self-examination of the breasts on a monthly basis, prevention of dental and periodontal  disease, diet, regular sustained exercise for at least 30 minutes 5 times per week, routine screening interval for mammogram as recommended by the [place] and ACOG, importance of regular PAP smears, smoking cessation, tobacco use,  and recommended schedule for GI hemoccult testing, colonoscopy, cholesterol, thyroid and diabetes screening. - DNR (Do Not Resuscitate)  2. Benign hypertension Blood pressure stable, conts current regimen.   3. Chronic L breast pain with chronic recurrent seroma, s/p excisional biopsy 01/12/2010 - MM DIGITAL SCREENING BILATERAL; Future  4. Memory loss Being followed by neurology, MMSE 29/30 in October, conts on aricept   5. Estrogen deficiency - DG Bone Density; Future  6. Chronic systolic heart failure (HCC) Stable, euvolemic, following with cardiology, conts on lasix and potassium, benicar, spironolactone and lopressor.   7. Recurrent major depressive disorder, in partial remission ([place]) In remission at this time. Following with psych at this time. conts on zoloft and  wellbutrin.   8. Hypothyroidism due to acquired atrophy of thyroid TSH in appropiate range, conts on synthroid 100 mcg.

## 2016-02-29 ENCOUNTER — Encounter: Payer: Medicare Other | Admitting: Internal Medicine

## 2016-02-29 ENCOUNTER — Other Ambulatory Visit: Payer: Self-pay

## 2016-02-29 ENCOUNTER — Encounter: Payer: Self-pay | Admitting: Cardiology

## 2016-02-29 DIAGNOSIS — Z853 Personal history of malignant neoplasm of breast: Secondary | ICD-10-CM

## 2016-03-06 LAB — HM MAMMOGRAPHY

## 2016-03-22 LAB — CUP PACEART REMOTE DEVICE CHECK
Battery Remaining Longevity: 90 mo
Battery Remaining Percentage: 100 %
Brady Statistic RA Percent Paced: 2 %
Brady Statistic RV Percent Paced: 91 %
Date Time Interrogation Session: 20171102135700
HighPow Impedance: 48 Ohm
Implantable Lead Implant Date: 20080116
Implantable Lead Implant Date: 20080116
Implantable Lead Implant Date: 20080116
Implantable Lead Location: 753859
Implantable Lead Location: 753860
Implantable Lead Location: 753860
Implantable Lead Model: 157
Implantable Lead Model: 4469
Implantable Lead Model: 4555
Implantable Lead Serial Number: 136532
Implantable Lead Serial Number: 161542
Implantable Lead Serial Number: 473495
Implantable Pulse Generator Implant Date: 20140911
Lead Channel Impedance Value: 397 Ohm
Lead Channel Impedance Value: 573 Ohm
Lead Channel Impedance Value: 761 Ohm
Lead Channel Pacing Threshold Amplitude: 0.7 V
Lead Channel Pacing Threshold Amplitude: 0.8 V
Lead Channel Pacing Threshold Amplitude: 1 V
Lead Channel Pacing Threshold Pulse Width: 0.4 ms
Lead Channel Pacing Threshold Pulse Width: 0.4 ms
Lead Channel Pacing Threshold Pulse Width: 0.8 ms
Lead Channel Setting Pacing Amplitude: 2 V
Lead Channel Setting Pacing Amplitude: 2 V
Lead Channel Setting Pacing Amplitude: 2.4 V
Lead Channel Setting Pacing Pulse Width: 0.4 ms
Lead Channel Setting Pacing Pulse Width: 0.8 ms
Lead Channel Setting Sensing Sensitivity: 0.6 mV
Lead Channel Setting Sensing Sensitivity: 1 mV
Pulse Gen Serial Number: 111765

## 2016-03-24 ENCOUNTER — Other Ambulatory Visit: Payer: Self-pay | Admitting: Internal Medicine

## 2016-05-05 ENCOUNTER — Other Ambulatory Visit: Payer: Self-pay | Admitting: Internal Medicine

## 2016-05-09 ENCOUNTER — Encounter: Payer: Self-pay | Admitting: Internal Medicine

## 2016-05-09 ENCOUNTER — Ambulatory Visit (INDEPENDENT_AMBULATORY_CARE_PROVIDER_SITE_OTHER): Payer: Medicare Other | Admitting: Internal Medicine

## 2016-05-09 VITALS — BP 124/76 | HR 66 | Ht 59.0 in | Wt 126.4 lb

## 2016-05-09 DIAGNOSIS — Z95810 Presence of automatic (implantable) cardiac defibrillator: Secondary | ICD-10-CM | POA: Diagnosis not present

## 2016-05-09 DIAGNOSIS — I5022 Chronic systolic (congestive) heart failure: Secondary | ICD-10-CM

## 2016-05-09 LAB — CUP PACEART INCLINIC DEVICE CHECK
Brady Statistic RA Percent Paced: 3 %
Brady Statistic RV Percent Paced: 91 %
Date Time Interrogation Session: 20180131113121
HighPow Impedance: 47 Ohm
Implantable Lead Implant Date: 20080116
Implantable Lead Implant Date: 20080116
Implantable Lead Implant Date: 20080116
Implantable Lead Location: 753859
Implantable Lead Location: 753860
Implantable Lead Location: 753860
Implantable Lead Model: 157
Implantable Lead Model: 4469
Implantable Lead Model: 4555
Implantable Lead Serial Number: 136532
Implantable Lead Serial Number: 161542
Implantable Lead Serial Number: 473495
Implantable Pulse Generator Implant Date: 20140911
Lead Channel Impedance Value: 408 Ohm
Lead Channel Impedance Value: 567 Ohm
Lead Channel Impedance Value: 778 Ohm
Lead Channel Pacing Threshold Amplitude: 0.4 V
Lead Channel Pacing Threshold Amplitude: 0.7 V
Lead Channel Pacing Threshold Amplitude: 0.9 V
Lead Channel Pacing Threshold Pulse Width: 0.4 ms
Lead Channel Pacing Threshold Pulse Width: 0.4 ms
Lead Channel Pacing Threshold Pulse Width: 0.8 ms
Lead Channel Sensing Intrinsic Amplitude: 18 mV
Lead Channel Sensing Intrinsic Amplitude: 6 mV
Lead Channel Setting Pacing Amplitude: 2 V
Lead Channel Setting Pacing Amplitude: 2 V
Lead Channel Setting Pacing Amplitude: 2.4 V
Lead Channel Setting Pacing Pulse Width: 0.4 ms
Lead Channel Setting Pacing Pulse Width: 0.8 ms
Lead Channel Setting Sensing Sensitivity: 0.6 mV
Lead Channel Setting Sensing Sensitivity: 1 mV
Pulse Gen Serial Number: 111765

## 2016-05-09 NOTE — Progress Notes (Signed)
HPI [name] returns today for followup. She is a very pleasant 83 year old woman with chronic systolic heart failure, left bundle branch block, status post biventricular ICD insertion, 8 years ago who underwent device generator change out approximately  3 years ago. In the interim she has been stable. She denies chest pain or sob. She has class IIA heart failure symptoms. No syncope. No recent ICD shock. Her main complaints are arthritic. Allergies  Allergen Reactions  . Iodine Shortness Of Breath    Iodine contrast, CHF , SOB  . Shellfish Allergy Shortness Of Breath  . Aspirin Nausea And Vomiting  . Codeine Nausea And Vomiting     Current Outpatient Prescriptions  Medication Sig Dispense Refill  . acetaminophen (TYLENOL) 325 MG tablet Take 2 tablets (650 mg total) by mouth every 6 (six) hours as needed for mild pain (or Fever >/= 101).    Marland Kitchen buPROPion (WELLBUTRIN XL) 150 MG 24 hr tablet Take 3 tablets (450 mg total) by mouth daily. 270 tablet 3  . cholecalciferol (VITAMIN D) 1000 UNITS tablet Take 1,000 Units by mouth daily.    Marland Kitchen donepezil (ARICEPT) 10 MG tablet TAKE ONE TABLET BY MOUTH AT BEDTIME TO PRESERVE MEMORY 90 tablet 1  . furosemide (LASIX) 40 MG tablet Take one (1) tablet (40 mg total) by mouth every morning and and half (1/2) tablet (20 mg total) by mouth every evening. 135 tablet 3  . levothyroxine (SYNTHROID, LEVOTHROID) 100 MCG tablet Take 1 tablet (100 mcg total) by mouth daily before breakfast. 90 tablet 3  . Magnesium 400 MG CAPS Take 400 mg by mouth daily.    . metoprolol tartrate (LOPRESSOR) 25 MG tablet TAKE 1 TABLET BY MOUTH  EVERY MORNING AND TAKE  ONE-HALF TABLET BY MOUTH  EVERY EVENING 135 tablet 1  . olmesartan (BENICAR) 40 MG tablet Take 1 tablet by mouth  every morning for blood  pressure 90 tablet 2  . Omega-3 Fatty Acids (FISH OIL) 1200 MG CAPS Take 1,200 mg by mouth every morning.    Marland Kitchen omeprazole (PRILOSEC) 40 MG capsule TAKE 1 CAPSULE BY MOUTH  DAILY FOR  STOMACH 90 capsule 1  . Oxcarbazepine (TRILEPTAL) 300 MG tablet Take 300 mg by mouth every morning.     . potassium chloride (K-DUR,KLOR-CON) 10 MEQ tablet Take 1 tablet by mouth  daily 90 tablet 2  . sertraline (ZOLOFT) 100 MG tablet Take 200 mg by mouth every morning.     Marland Kitchen spironolactone (ALDACTONE) 25 MG tablet TAKE 1 TABLET BY MOUTH  EVERY MORNING 90 tablet 1  . tiZANidine (ZANAFLEX) 4 MG tablet TAKE 1 TABLET EVERY 6 HOURS AS NEEDED FOR MUSCLE SPASM. 60 tablet 2  . vitamin E 1000 UNIT capsule Take 1,000 Units by mouth daily.     No current facility-administered medications for this visit.      Past Medical History:  Diagnosis Date  . Arthritis   . Cancer [facility])    left breast cancer   . CHF (congestive heart failure) ([place])    PACEMAKER & DEFIB  . Complication of anesthesia    hypotensive after back surgery in 2006- reports on chart   . Depression   . Dyslipidemia   . Fainted 04/21/06   AT [place]  . GERD (gastroesophageal reflux disease)   . Headache(784.0)   . Hearing loss   . HLD (hyperlipidemia)   . Hypertension   . Hypothyroidism   . ICD (implantable cardiac defibrillator) in place    pt has pacer/icd  .  ICD (implantable cardiac defibrillator), biventricular, in situ   . LBBB (left bundle branch block)   . Memory loss   . Nonischemic cardiomyopathy ([place])   . Normal coronary arteries    s/p cardiac cath 2007  . Pacemaker    Guidant Device  . Syncope   . Systolic CHF ([place])   . Vertigo   . Wears glasses     ROS:   All systems reviewed and negative except as noted in the HPI.   Past Surgical History:  Procedure Laterality Date  . BACK SURGERY     lumbar fusion   . BREAST SURGERY  2000   LUMP REMOVAL. STAGE 1 CANCER  . CARDIAC CATHETERIZATION    . CATARACT EXTRACTION    . EYE SURGERY    . IMPLANTABLE CARDIOVERTER DEFIBRILLATOR GENERATOR CHANGE N/A 12/18/2012   Procedure: IMPLANTABLE CARDIOVERTER DEFIBRILLATOR GENERATOR CHANGE;  Surgeon: [name],  MD;  Location: [facility] CATH LAB;  Service: Cardiovascular;  Laterality: N/A;  . JOINT REPLACEMENT  06/14/01   right  . LUMBAR FUSION  2006  . MASS EXCISION  11/08/2011   Procedure: EXCISION MASS;  Surgeon: [name], MD;  Location: [facility];  Service: General;  Laterality: Left;  Excision Left Thigh Mass  . MASTECTOMY, PARTIAL  2008   GOT PACEMAKER AND DEFIB AT THAT TIME  . PACEMAKER INSERTION  04/23/06  . TOTAL KNEE ARTHROPLASTY  05/17/01   RIGHT KNEE  . TOTAL KNEE ARTHROPLASTY Left 11/29/2014   Procedure: TOTAL LEFT KNEE ARTHROPLASTY;  Surgeon: Paralee Cancel, MD;  Location: [facility];  Service: Orthopedics;  Laterality: Left;     Family History  Problem Relation Age of Onset  . Hypertension Mother   . Arthritis Mother   . Hypertension Father   . Hypertension Brother   . Hypertension Brother      Social History   Social History  . Marital status: Married    Spouse name: N/A  . Number of children: 1  . Years of education: Masters   Occupational History  . Retired    Social History Main Topics  . Smoking status: Never Smoker  . Smokeless tobacco: Never Used  . Alcohol use No  . Drug use: No  . Sexual activity: Not Currently    Birth control/ protection: Post-menopausal   Other Topics Concern  . Not on file   Social History Narrative   Lives at home with husband.   Right-handed.      As of 07/28/2014   Diet: No special diet   Caffeine: yes, Chocolate, tea and sodas    Married: YES, 1970   House: Yes, 2 stories, 2-3 persons live in home   Pets: No   Current/Past profession: Engineer, mining, Designer, jewellery    Exercise: Yes 2-3 x weekly   Living Will: Yes   DNR: No   POA/HPOA: No           BP 124/76   Pulse 66   Ht 4\' 11"  (1.499 m)   Wt 126 lb 6.4 oz (57.3 kg)   BMI 25.53 kg/m   Physical Exam:  Well appearing 83 year old woman, NAD HEENT: Unremarkable Neck:  7 cm JVD, no thyromegally Back:  No CVA tenderness Lungs:  Clear with no wheezes, rales, or  rhonchi. Well healed ICD incision. HEART:  Regular rate rhythm, no murmurs, no rubs, no clicks Abd:  soft, positive bowel sounds, no organomegally, no rebound, no guarding Ext:  2 plus pulses, no edema, no cyanosis, no  clubbing Skin:  No rashes no nodules Neuro:  CN II through XII intact, motor grossly intact  ECG - nsr with biv pacing  DEVICE  Normal device function.  See PaceArt for details.   Assess/Plan: 1. Chronic systolic heart failure - her symptoms remain class 2. She will continue her current meds. 2. ICD - her device is working normally. She does have some improved AV conduction and was not pacing today until we tightened up her AV delay. 3. HTN heart disease - her blood pressure is well controlled.   Mikle Bosworth.D.

## 2016-05-09 NOTE — Patient Instructions (Signed)
Medication Instructions:  Your physician recommends that you continue on your current medications as directed. Please refer to the Current Medication list given to you today.   Labwork: None Ordered   Testing/Procedures: None Ordered   Follow-Up: Your physician wants you to follow-up in: 1 year with Dr. Lovena Le. You will receive a reminder letter in the mail two months in advance. If you don't receive a letter, please call our office to schedule the follow-up appointment.  Remote monitoring is used to monitor your ICD from home. This monitoring reduces the number of office visits required to check your device to one time per year. It allows [place] to keep an eye on the functioning of your device to ensure it is working properly. You are scheduled for a device check from home on 08/08/16. You may send your transmission at any time that day. If you have a wireless device, the transmission will be sent automatically. After your physician reviews your transmission, you will receive a postcard with your next transmission date.      Any Other Special Instructions Will Be Listed Below (If Applicable).     If you need a refill on your cardiac medications before your next appointment, please call your pharmacy.

## 2016-06-29 ENCOUNTER — Ambulatory Visit: Payer: Medicare Other | Admitting: Internal Medicine

## 2016-07-16 ENCOUNTER — Ambulatory Visit (INDEPENDENT_AMBULATORY_CARE_PROVIDER_SITE_OTHER): Payer: Medicare Other | Admitting: Neurology

## 2016-07-16 ENCOUNTER — Encounter: Payer: Self-pay | Admitting: Neurology

## 2016-07-16 VITALS — BP 122/68 | HR 64 | Ht 59.0 in | Wt 123.5 lb

## 2016-07-16 DIAGNOSIS — G3184 Mild cognitive impairment, so stated: Secondary | ICD-10-CM

## 2016-07-16 DIAGNOSIS — R413 Other amnesia: Secondary | ICD-10-CM

## 2016-07-16 DIAGNOSIS — M5441 Lumbago with sciatica, right side: Secondary | ICD-10-CM | POA: Diagnosis not present

## 2016-07-16 DIAGNOSIS — M545 Low back pain, unspecified: Secondary | ICD-10-CM | POA: Insufficient documentation

## 2016-07-16 DIAGNOSIS — G8929 Other chronic pain: Secondary | ICD-10-CM

## 2016-07-16 DIAGNOSIS — R269 Unspecified abnormalities of gait and mobility: Secondary | ICD-10-CM | POA: Insufficient documentation

## 2016-07-16 MED ORDER — DONEPEZIL HCL 10 MG PO TABS: 10.0000 mg | ORAL_TABLET | Freq: Every day | ORAL | 4 refills | Status: DC

## 2016-07-16 NOTE — Progress Notes (Signed)
[facility]  PATIENT: [name] DOB: 09/23/1933   HISTORY OF PRESENT ILLNESS: HISTORY [name] is a 83 yo RH AAF referred by her primary care Dr. Baird Cancer for evaluation of memory trouble, drive herself to office alone at today's clinical visit. She is a retired Writer in 2003, she retired at age 73 because of her right knee pain, she had right knee replacement, in 2004, she also had lumbar decompression surgery by Dr. Nicholes Calamity under general anesthesia, woke up from surgery, she noticed mild memory trouble, she has short-term memory trouble, has been persistent since then, she denies difficulty talking, no strokelike symptoms then. She lives at home with her family, highly functional, driving, independent at daily activity, able to keep her check in balance, She suffered long-standing history of bipolar disorder, on polypharmacy treatment, this including Trileptal 300 mg daily, Zoloft 100 mg a day, Wellbutrin 150 mg 3 tablets a day, She had accident of sudden onset dizziness couple days ago, after dinner, she felt lightheaded, has to crawling upstairs, was helped by her husband to get up, then she fell to the ground, whole-body shaking, no loss of consciousness. She has baseline mild gait difficulty due to her low back pain, bilateral knee pain, She denied a family history of dementia, CT head in 2013, Unchanged mild atrophy and microvascular ischemic disease without acute intracranial process. She had a history of chronic systolic heart failure, left bundle branch block, status post biventricular ICD insertion in 2009, underwent device generator change out Sept 2014. S She presents for one year evaluation. She reports feeling dizzy Tuesday night with improvement by Wednesday morning. She also reports upper body "shaking". The only pain she has is from arthritis. She gets some mild edema in her right ankle. She reports her blood pressure  at home usually runs 110-120/60-70. Sometimes when she gets up quickly, she felt lightheaded.  UPDATE April 8th 2016: She is overall doing very well, only has occasionally dizziness, especially when she gets up quickly, today's Mini-Mental status examination is 29 out of 30 CAT scan of the brain showed mild small vessel disease, no acute lesions, EEG showed mild slowing  UPDATE July 16 2016: She drive here herself, she continue complains of worsening memory loss, she lives with her husband in their house of more than 23 years old, she is independent in her daily activity, exercise regularly,  Following senior emexercise program on TV, she has mild low back pain, cause mild gait difficulty sometimes, radiating pain to bilateral lower extremity, she has no bowel and bladder incontinence.  REVIEW OF SYSTEMS: Full 14 system review of systems performed and notable only for those listed, all others are neg:      ALLERGIES: Allergies  Allergen Reactions  . Iodine Shortness Of Breath    Iodine contrast, CHF , SOB  . Shellfish Allergy Shortness Of Breath  . Aspirin Nausea And Vomiting  . Codeine Nausea And Vomiting    HOME MEDICATIONS: Outpatient Medications Prior to Visit  Medication Sig Dispense Refill  . acetaminophen (TYLENOL) 325 MG tablet Take 2 tablets (650 mg total) by mouth every 6 (six) hours as needed for mild pain (or Fever >/= 101).    Marland Kitchen buPROPion (WELLBUTRIN XL) 150 MG 24 hr tablet Take 3 tablets (450 mg total) by mouth daily. 270 tablet 3  . cholecalciferol (VITAMIN D) 1000 UNITS tablet Take 1,000 Units by mouth daily.    Marland Kitchen donepezil (ARICEPT) 10 MG tablet TAKE ONE  TABLET BY MOUTH AT BEDTIME TO PRESERVE MEMORY 90 tablet 1  . furosemide (LASIX) 40 MG tablet Take one (1) tablet (40 mg total) by mouth every morning and and half (1/2) tablet (20 mg total) by mouth every evening. 135 tablet 3  . levothyroxine (SYNTHROID, LEVOTHROID) 100 MCG tablet Take 1 tablet (100 mcg total) by  mouth daily before breakfast. 90 tablet 3  . Magnesium 400 MG CAPS Take 400 mg by mouth daily.    . metoprolol tartrate (LOPRESSOR) 25 MG tablet TAKE 1 TABLET BY MOUTH  EVERY MORNING AND TAKE  ONE-HALF TABLET BY MOUTH  EVERY EVENING 135 tablet 1  . olmesartan (BENICAR) 40 MG tablet Take 1 tablet by mouth  every morning for blood  pressure 90 tablet 2  . Omega-3 Fatty Acids (FISH OIL) 1200 MG CAPS Take 1,200 mg by mouth every morning.    Marland Kitchen omeprazole (PRILOSEC) 40 MG capsule TAKE 1 CAPSULE BY MOUTH  DAILY FOR STOMACH 90 capsule 1  . Oxcarbazepine (TRILEPTAL) 300 MG tablet Take 300 mg by mouth every morning.     . potassium chloride (K-DUR,KLOR-CON) 10 MEQ tablet Take 1 tablet by mouth  daily 90 tablet 2  . sertraline (ZOLOFT) 100 MG tablet Take 200 mg by mouth every morning.     Marland Kitchen spironolactone (ALDACTONE) 25 MG tablet TAKE 1 TABLET BY MOUTH  EVERY MORNING 90 tablet 1  . tiZANidine (ZANAFLEX) 4 MG tablet TAKE 1 TABLET EVERY 6 HOURS AS NEEDED FOR MUSCLE SPASM. 60 tablet 2  . vitamin E 1000 UNIT capsule Take 1,000 Units by mouth daily.     No facility-administered medications prior to visit.     PAST MEDICAL HISTORY: Past Medical History:  Diagnosis Date  . Arthritis   . Cancer [facility])    left breast cancer   . CHF (congestive heart failure) ([place])    PACEMAKER & DEFIB  . Complication of anesthesia    hypotensive after back surgery in 2006- reports on chart   . Depression   . Dyslipidemia   . Fainted 04/21/06   AT [place]  . GERD (gastroesophageal reflux disease)   . Headache(784.0)   . Hearing loss   . HLD (hyperlipidemia)   . Hypertension   . Hypothyroidism   . ICD (implantable cardiac defibrillator) in place    pt has pacer/icd  . ICD (implantable cardiac defibrillator), biventricular, in situ   . LBBB (left bundle branch block)   . Memory loss   . Nonischemic cardiomyopathy ([place])   . Normal coronary arteries    s/p cardiac cath 2007  . Pacemaker    Guidant Device  . Syncope    . Systolic CHF ([place])   . Vertigo   . Wears glasses     PAST SURGICAL HISTORY: Past Surgical History:  Procedure Laterality Date  . BACK SURGERY     lumbar fusion   . BREAST SURGERY  2000   LUMP REMOVAL. STAGE 1 CANCER  . CARDIAC CATHETERIZATION    . CATARACT EXTRACTION    . EYE SURGERY    . IMPLANTABLE CARDIOVERTER DEFIBRILLATOR GENERATOR CHANGE N/A 12/18/2012   Procedure: IMPLANTABLE CARDIOVERTER DEFIBRILLATOR GENERATOR CHANGE;  Surgeon: [name], MD;  [facility]: [facility];  Service: Cardiovascular;  Laterality: N/A;  . JOINT REPLACEMENT  06/14/01   right  . LUMBAR FUSION  2006  . MASS EXCISION  11/08/2011   Procedure: EXCISION MASS;  Surgeon: [name], MD;  Location: [facility];  Service: General;  Laterality:  Left;  Excision Left Thigh Mass  . MASTECTOMY, PARTIAL  2008   GOT PACEMAKER AND DEFIB AT THAT TIME  . PACEMAKER INSERTION  04/23/06  . TOTAL KNEE ARTHROPLASTY  05/17/01   RIGHT KNEE  . TOTAL KNEE ARTHROPLASTY Left 11/29/2014   Procedure: TOTAL LEFT KNEE ARTHROPLASTY;  Surgeon: Paralee Cancel, MD;  Location: [facility]S;  Service: Orthopedics;  Laterality: Left;    FAMILY HISTORY: Family History  Problem Relation Age of Onset  . Hypertension Mother   . Arthritis Mother   . Hypertension Father   . Hypertension Brother   . Hypertension Brother     SOCIAL HISTORY: Social History   Social History  . Marital status: Married    Spouse name: N/A  . Number of children: 1  . Years of education: Masters   Occupational History  . Retired    Social History Main Topics  . Smoking status: Never Smoker  . Smokeless tobacco: Never Used  . Alcohol use No  . Drug use: No  . Sexual activity: Not Currently    Birth control/ protection: Post-menopausal   Other Topics Concern  . Not on file   Social History Narrative   Lives at home with husband.   Right-handed.      As of 07/28/2014   Diet: No special diet   Caffeine: yes, Chocolate, tea and sodas    Married: YES,  1970   House: Yes, 2 stories, 2-3 persons live in home   Pets: No   Current/Past profession: Engineer, mining, Nurse Practitioner    Exercise: Yes 2-3 x weekly   Living Will: Yes   DNR: No   POA/HPOA: No           PHYSICAL EXAM  Vitals:   07/16/16 0937  BP: 122/68  Pulse: 64  Weight: 123 lb 8 oz (56 kg)  Height: 4\' 11"  (1.499 m)   Body mass index is 24.94 kg/m. Generalized: Well developed, in no acute distress  Head: normocephalic and atraumatic,. Oropharynx benign  Neck: Supple, no carotid bruits  Cardiac: Regular rate rhythm, no murmur  Musculoskeletal: No deformity   Neurological examination   Mentation: MMSE - Mini Mental State Exam 07/16/2016 01/13/2016 01/20/2015  Orientation to time 5 5 4   Orientation to Place 5 5 5   Registration 3 3 3   Attention/ Calculation 5 5 5   Recall 3 2 2   Language- name 2 objects 2 2 2   Language- repeat 1 1 1   Language- follow 3 step command 3 3 2   Language- read & follow direction 1 1 0  Write a sentence 1 1 1   Copy design 1 1 1   Total score 30 29 26    Animal naming 14  Cranial nerve II-XII: Pupils were equal round reactive to light extraocular movements were full, visual field were full on confrontational test. Facial sensation and strength were normal. hearing was intact to finger rubbing bilaterally. Uvula tongue midline. head turning and shoulder shrug were normal and symmetric.Tongue protrusion into cheek strength was normal. Motor: Limited due to bilateral knee pain, she has mild bilateral toe extension flexion weakness, Sensory: normal and symmetric to light touch, pinprick, and Vibration,  Coordination: finger-nose-finger, heel-to-shin bilaterally, no dysmetria Reflexes: Brachioradialis 2/2, biceps 2/2, triceps 2/2, patellar absent, Achilles trace, plantar responses were flexor bilaterally. Gait and Station: mild antalgic  DIAGNOSTIC DATA (LABS, IMAGING, TESTING) - I reviewed patient records, labs, notes, testing and  imaging myself where available.      Component Value Date/Time  NA 139 02/27/2016 1348   NA 139 10/05/2015 0944   K 4.2 02/27/2016 1348   CL 106 02/27/2016 1348   CO2 30 02/27/2016 1348   GLUCOSE 82 02/27/2016 1348   BUN 19 02/27/2016 1348   BUN 25 10/05/2015 0944   CREATININE 0.76 02/27/2016 1348   CALCIUM 9.7 02/27/2016 1348   PROT 6.4 10/05/2015 0944   ALBUMIN 4.3 10/05/2015 0944   AST 32 10/05/2015 0944   ALT 25 02/27/2016 1348   ALKPHOS 100 10/05/2015 0944   BILITOT <0.2 10/05/2015 0944   GFRNONAA 74 02/27/2016 1348   GFRAA 86 02/27/2016 1348   Lab Results  Component Value Date   CHOL 167 02/27/2016   HDL 66 02/27/2016   LDLCALC 84 02/27/2016   TRIG 84 02/27/2016   CHOLHDL 2.5 02/27/2016    Lab Results  Component Value Date   TSH 1.20 02/27/2016    ASSESSMENT AND PLAN 83 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 Ph.D.  [facility] [place], [place] [address] Phone: [phone] Fax:      [phone]

## 2016-07-26 ENCOUNTER — Ambulatory Visit: Payer: Medicare Other | Attending: Neurology | Admitting: Physical Therapy

## 2016-07-26 DIAGNOSIS — R2689 Other abnormalities of gait and mobility: Secondary | ICD-10-CM | POA: Insufficient documentation

## 2016-07-26 DIAGNOSIS — M6281 Muscle weakness (generalized): Secondary | ICD-10-CM | POA: Insufficient documentation

## 2016-07-26 NOTE — Therapy (Signed)
[facility] [place] [address] [place], [place], [address] Phone: [phone]   Fax:  [phone]  Physical Therapy Evaluation  Patient Details  Name: [name] MRN: [id number] Date of Birth: 07-08-1933 Referring Provider: Dr. Krista Blue  Encounter Date: 07/26/2016      PT End of Session - 07/26/16 1510    Visit Number 1   Number of Visits 9   Date for PT Re-Evaluation 08/24/16   Authorization Time Period 07/26/16 to 09/24/16   PT Start Time 0935   PT Stop Time 1018   PT Time Calculation (min) 43 min   Activity Tolerance Patient tolerated treatment well   Behavior During Therapy [facility] for tasks assessed/performed      Past Medical History:  Diagnosis Date  . Arthritis   . Cancer [facility])    left breast cancer   . CHF (congestive heart failure) ([place])    PACEMAKER & DEFIB  . Complication of anesthesia    hypotensive after back surgery in 2006- reports on chart   . Depression   . Dyslipidemia   . Fainted 04/21/06   AT [place]  . GERD (gastroesophageal reflux disease)   . Headache(784.0)   . Hearing loss   . HLD (hyperlipidemia)   . Hypertension   . Hypothyroidism   . ICD (implantable cardiac defibrillator) in place    pt has pacer/icd  . ICD (implantable cardiac defibrillator), biventricular, in situ   . LBBB (left bundle branch block)   . Memory loss   . Nonischemic cardiomyopathy ([place])   . Normal coronary arteries    s/p cardiac cath 2007  . Pacemaker    Guidant Device  . Syncope   . Systolic CHF ([place])   . Vertigo   . Wears glasses     Past Surgical History:  Procedure Laterality Date  . BACK SURGERY     lumbar fusion   . BREAST SURGERY  2000   LUMP REMOVAL. STAGE 1 CANCER  . CARDIAC CATHETERIZATION    . CATARACT EXTRACTION    . EYE SURGERY    . IMPLANTABLE CARDIOVERTER DEFIBRILLATOR GENERATOR CHANGE N/A 12/18/2012   Procedure: IMPLANTABLE CARDIOVERTER DEFIBRILLATOR GENERATOR CHANGE;  Surgeon: [name], MD;  Location: [facility] CATH LAB;  Service: Cardiovascular;  Laterality: N/A;  . JOINT REPLACEMENT  06/14/01   right  . LUMBAR FUSION  2006  . MASS EXCISION  11/08/2011   Procedure: EXCISION MASS;  Surgeon: [name], MD;  [facility];  Service: General;  Laterality: Left;  Excision Left Thigh Mass  . MASTECTOMY, PARTIAL  2008   GOT PACEMAKER AND DEFIB AT THAT TIME  . PACEMAKER INSERTION  04/23/06  . TOTAL KNEE ARTHROPLASTY  05/17/01   RIGHT KNEE  . TOTAL KNEE ARTHROPLASTY Left 11/29/2014   Procedure: TOTAL LEFT KNEE ARTHROPLASTY;  Surgeon: Paralee Cancel, MD;  Location[facility];  Service: Orthopedics;  Laterality: Left;    There were no vitals filed for this visit.       Subjective Assessment - 07/26/16 0940    Subjective Dr. Krista Blue suggested I come to Physical Therapy. Staggering gait. No falls but near falls. Been going on for about a year. Usually stumble over something.    Limitations Standing   How long can you sit comfortably? could do longer but stands after 30 minutes   How long can you stand comfortably? 30 min   How long can you walk comfortably? knees limit    Patient Stated Goals  to stop the staggering; improve balance   Currently in Pain? Yes   Pain Score 6    Pain Location Back   Pain Orientation Lower   Pain Descriptors / Indicators Aching   Pain Type Chronic pain   Pain Onset More than a month ago   Pain Relieving Factors nothing; doesn't like to take pain medicine due to grogginess    ** Pain will be monitored throughout course of PT, however due to chronic in nature will not address pain specifically as part of the plan of care.        [facility] PT Assessment - 07/26/16 0947      Assessment   Medical Diagnosis gait abnormality; low back pain with RLE sciatica; mild cognitive impairment   Referring Provider Dr. Krista Blue   Onset Date/Surgical Date --  about a year ago I started having problems   Prior Therapy Acute PT after TKR, none since     Precautions    Precautions None     Balance Screen   Has the patient fallen in the past 6 months No   Has the patient had a decrease in activity level because of a fear of falling?  Yes   Is the patient reluctant to leave their home because of a fear of falling?  No     Home Ecologist residence   Living Arrangements Spouse/significant other   Available Help at Discharge Family   Type of [place] to enter   Entrance Stairs-Number of Steps 5   [place] Two level;Other (Comment);Bed/bath upstairs   Alternate Level Stairs-Rails Right;Left   Home Equipment Walker - 2 wheels;Kasandra Knudsen - single point     Prior Function   Level of Independence Independent with household mobility without device;Independent with homemaking with ambulation   Vocation Retired   Leisure movies,      Cognition   Overall Cognitive Status History of cognitive impairments - at baseline   Memory Impaired   Memory Impairment Decreased recall of new information  per patient     Observation/Other Assessments   Focus on Therapeutic Outcomes (FOTO)  FS 63 (risk adjusted 54)   Activities of Balance Confidence Scale (ABC Scale)  18.8%   Fear Avoidance Belief Questionnaire (FABQ)  47     Sensation   Light Touch Impaired by gross assessment  when back hurting, numbness into her legs     Coordination   Gross Motor Movements are Fluid and Coordinated Yes     Posture/Postural Control   Posture/Postural Control No significant limitations     ROM / Strength   AROM / PROM / Strength AROM;Strength     AROM   Overall AROM  Within functional limits for tasks performed     Strength   Overall Strength Deficits  RLE WNL: LLE deficits   Overall Strength Comments Lt knee extension 4/5, knee flexion 3/5, ankle DF 5/5     Transfers   Transfers Sit to Stand;Stand to Sit   Sit to Stand 6: Modified independent (Device/Increase time);Without upper  extremity assist   Five time sit to stand comments  13.44   Stand to Sit 6: Modified independent (Device/Increase time);Without upper extremity assist     Ambulation/Gait   Ambulation Distance (Feet) 50 Feet  100   Assistive device None   Gait Pattern Step-through pattern;Decreased arm swing - left;Decreased arm swing - right;Decreased stride length;Decreased trunk rotation  Gait velocity 3.17 ft/sec normal; 3.66 ft/sec (fast)      Standardized Balance Assessment   Standardized Balance Assessment        Functional Gait  Assessment   Gait assessed  Yes   Gait Level Surface Walks 20 ft in less than 5.5 sec, no assistive devices, good speed, no evidence for imbalance, normal gait pattern, deviates no more than 6 in outside of the 12 in walkway width.   Change in Gait Speed Able to smoothly change walking speed without loss of balance or gait deviation. Deviate no more than 6 in outside of the 12 in walkway width.   Gait with Horizontal Head Turns Performs head turns smoothly with no change in gait. Deviates no more than 6 in outside 12 in walkway width   Gait with Vertical Head Turns Performs task with slight change in gait velocity (eg, minor disruption to smooth gait path), deviates 6 - 10 in outside 12 in walkway width or uses assistive device   Gait and Pivot Turn Pivot turns safely within 3 sec and stops quickly with no loss of balance.   Step Over Obstacle Is able to step over one shoe box (4.5 in total height) without changing gait speed. No evidence of imbalance.   Gait with Narrow Base of Support Ambulates 4-7 steps.   Gait with Eyes Closed Walks 20 ft, slow speed, abnormal gait pattern, evidence for imbalance, deviates 10-15 in outside 12 in walkway width. Requires more than 9 sec to ambulate 20 ft.   Ambulating Backwards Walks 20 ft, slow speed, abnormal gait pattern, evidence for imbalance, deviates 10-15 in outside 12 in walkway width.   Steps Alternating feet, must use rail.    Total Score 21                           PT Education - 07/26/16 1355    Education provided Yes   Education Details eval results; PT POC   Person(s) Educated Patient   Methods Explanation   Comprehension Verbalized understanding          PT Short Term Goals - 07/26/16 1723      PT SHORT TERM GOAL #1   Title Perform Berg Balance Assessment and set target for LTG. (Target date 08/03/16)   Time 1   Period Weeks   Status New           PT Long Term Goals - 07/26/16 1720      PT LONG TERM GOAL #1   Title Patient will verbalize knowledge of fall prevention measures that she could utilize to reduce the risk of falls in her home.   Time 4   Period Weeks   Status New     PT LONG TERM GOAL #2   Title Patient will improve Berg score to ___/56 (TBA based on completion of Berg) to reflect a decreased risk of falls and improved balance.    Time 4   Period Weeks   Status New     PT LONG TERM GOAL #3   Title Patient will improve FGA score to >= 25/30 to reflect a decreased risk of falls and improved balance.    Time 4   Period Weeks   Status New     PT LONG TERM GOAL #4   Title Patient will be Independent with HEP to address balance and strength deficits.   Time 4   Period Weeks   Status New  PT LONG TERM GOAL #5   Title Patient will improve her ABC score to >=24% to reflect an improved sense of balance while completing functional tasks.   Time 4   Period Weeks   Status New               Plan - 07/26/16 1512    Clinical Impression Statement Patient referred to PT by MD due to gradual onset of gait and balance challenges. Pt is now to the point of staggering losses of balance and near falls. Patient found to be at risk for falling based on her FGA score of 21/30 (<22 indicative of increased fall risk). Patient's ABC score of 18.8% shows a significant lack of confidence in her balance and safety completing functional tasks. Patient demonstrated  decr strength LLE, impaired gait, and impaired balance. Patient will benefit from skilled PT to address these deficits and decrease her fall risk.    Rehab Potential Good   Clinical Impairments Affecting Rehab Potential decr memory    PT Frequency 2x / week   PT Duration 4 weeks   PT Treatment/Interventions ADLs/Self Care Home Management;DME Instruction;Gait training;Stair training;Functional mobility training;Therapeutic activities;Therapeutic exercise;Balance training;Neuromuscular re-education;Cognitive remediation;Patient/family education;Orthotic Fit/Training   PT Next Visit Plan do Berg & update goals; review fall prevention; initiate HEP (standing balance, LLE strengthening)   Consulted and Agree with Plan of Care Patient      Patient will benefit from skilled therapeutic intervention in order to improve the following deficits and impairments:  Abnormal gait, Decreased balance, Decreased cognition, Decreased knowledge of use of DME, Decreased mobility, Decreased strength, Difficulty walking, Impaired sensation  Visit Diagnosis: Muscle weakness (generalized) - Plan: PT plan of care cert/re-cert  Other abnormalities of gait and mobility - Plan: PT plan of care cert/re-cert      G-Codes - 42/59/56 1734    Functional Assessment Tool Used (Outpatient Only) FGA 21/30; gait velocity 3.17 ft/sec (normative data 102-81 yo female 2.80 - 3.39 ft/sec)   Functional Limitation Mobility: Walking and moving around   Mobility: Walking and Moving Around Current Status [phone]) At least 20 percent but less than 40 percent impaired, limited or restricted   Mobility: Walking and Moving Around Goal Status [phone]) At least 1 percent but less than 20 percent impaired, limited or restricted       Problem List Patient Active Problem List   Diagnosis Date Noted  . Gait abnormality 07/16/2016  . Chronic low back pain 07/16/2016  . Mild cognitive impairment 07/16/2016  . Wrist pain 05/10/2015  . S/P left  TKA 11/29/2014  . S/P knee replacement 11/29/2014  . Spontaneous bruising 08/27/2014  . Numbness and tingling in right hand 07/28/2014  . Essential tremor 07/28/2014  . Dizziness 05/28/2014  . Shaky 05/28/2014  . Memory loss 05/28/2014  . Depression 05/06/2014  . Burn of foot, left, second degree 12/11/2011  . Lipoma of left upper thigh 3x5 cm 09/28/2011  . Other primary cardiomyopathies 08/09/2011  . Syncope 08/09/2011  . History of breast cancer [id number], s/p BCT 2008, triple negative 01/26/2011  . Chronic L breast pain with chronic recurrent seroma, s/p excisional biopsy 01/12/2010 01/26/2011  . Fainted   . ICD (implantable cardioverter-defibrillator), biventricular, in situ 08/02/2010  . Chronic systolic heart failure ([place]) 08/02/2010  . Benign hypertension 08/02/2010    Rexanne Mano, PT  07/26/2016, 5:48 PM  [place] [address] [place], [place], [address] Phone: [phone]   Fax:  [phone]  Name: [name] MRN: [id number] Date of Birth: 1933-10-12

## 2016-08-01 ENCOUNTER — Ambulatory Visit: Payer: Medicare Other | Admitting: Physical Therapy

## 2016-08-01 ENCOUNTER — Encounter: Payer: Self-pay | Admitting: Physical Therapy

## 2016-08-01 DIAGNOSIS — M6281 Muscle weakness (generalized): Secondary | ICD-10-CM | POA: Diagnosis not present

## 2016-08-01 DIAGNOSIS — R2689 Other abnormalities of gait and mobility: Secondary | ICD-10-CM

## 2016-08-01 NOTE — Patient Instructions (Signed)
   Copyright  VHI. All rights reserved.  Feet Apart (Compliant Surface) Head Motion - Eyes Closed    Stand on compliant surface:  with feet shoulder width apart. Close eyes and hold for 30 seconds. Open eyes and move head slowly, right and left, then up and down. Repeat 10 times each direction. Do 1 sessions per day.  Copyright  VHI. All rights reserved.  Feet Together (Compliant Surface) Head Motion - Eyes Closed    Stand on compliant surface:  with feet together. Close eyes and hold for 30 seconds. Open eyes and move head slowly, right and left, then up and down. Repeat 10 times each direction. Do 1 sessions per day.Copyright  VHI. All rights reserved.     Feet Partial Heel-Toe (Compliant Surface) Head Motion - Eyes Closed    Stand on compliant surface:  with right foot partially in front of the other. Close eyes and hold for 30 seconds. Open eyes and move head slowly, right and left, then up and down. Repeat 10 times each direction. Do 1 sessions per day.  Copyright  VHI. All rights reserved.      Pelvic Tilt    Flatten back by tightening stomach muscles and buttocks, tipping your pelvis. Hold for 5 seconds. Repeat 5-10 times per set. Do 1 sets per session. Do 1 sessions per day.  http://orth.exer.us/134   Copyright  VHI. All rights reserved.  Knee to Chest (Flexion)    Pull right knee toward chest. Feel stretch in lower back or buttock area. Breathing deeply, Hold 15-30 seconds.  Repeat 3 times. Do 1 sessions per day.  If having thigh pain, pull knee to chest and then slowly raise foot toward the ceiling by straightening your knee and then bend knee back down. Repeat up to 5 times. http://gt2.exer.us/225   Copyright  VHI. All rights reserved.           Double Knee to Chest (Flexion)    Copyright  VHI. All rights reserved.  Lumbar Rotation: Caudal - Bilateral (Supine)    Feet and knees together, arms outstretched, rotate knees left,  turning head in opposite direction, until stretch is felt. Hold 15-30 seconds. Relax. Repeat 3 times per set. Do 1 sets per session.   http://orth.exer.us/1020   Copyright  VHI. All rights reserved.

## 2016-08-01 NOTE — Therapy (Signed)
[facility] [address] [place], [place], [address] Phone: [phone]   Fax:  [phone]  Physical Therapy Treatment  Patient Details  Name: [name] MRN: [id number] Date of Birth: 1934-03-07 Referring Provider: Dr. Krista Blue  Encounter Date: 08/01/2016      PT End of Session - 08/01/16 1207    Visit Number 2   Number of Visits 9   Date for PT Re-Evaluation 08/24/16   Authorization Time Period 07/26/16 to 09/24/16   PT Start Time 0845   PT Stop Time 0932   PT Time Calculation (min) 47 min   Activity Tolerance Patient tolerated treatment well;No increased pain  monitored back pain throughout   Behavior During Therapy [facility] for tasks assessed/performed      Past Medical History:  Diagnosis Date  . Arthritis   . Cancer [facility])    left breast cancer   . CHF (congestive heart failure) ([place])    PACEMAKER & DEFIB  . Complication of anesthesia    hypotensive after back surgery in 2006- reports on chart   . Depression   . Dyslipidemia   . Fainted 04/21/06   AT [place]  . GERD (gastroesophageal reflux disease)   . Headache(784.0)   . Hearing loss   . HLD (hyperlipidemia)   . Hypertension   . Hypothyroidism   . ICD (implantable cardiac defibrillator) in place    pt has pacer/icd  . ICD (implantable cardiac defibrillator), biventricular, in situ   . LBBB (left bundle branch block)   . Memory loss   . Nonischemic cardiomyopathy ([place])   . Normal coronary arteries    s/p cardiac cath 2007  . Pacemaker    Guidant Device  . Syncope   . Systolic CHF ([place])   . Vertigo   . Wears glasses     Past Surgical History:  Procedure Laterality Date  . BACK SURGERY     lumbar fusion   . BREAST SURGERY  2000   LUMP REMOVAL. STAGE 1 CANCER  . CARDIAC CATHETERIZATION    . CATARACT EXTRACTION    . EYE SURGERY    . IMPLANTABLE CARDIOVERTER DEFIBRILLATOR GENERATOR CHANGE N/A 12/18/2012   Procedure: IMPLANTABLE CARDIOVERTER  DEFIBRILLATOR GENERATOR CHANGE;  Surgeon: [name], MD;  [facility] C[facility] LAB;  Service: Cardiovascular;  Laterality: N/A;  . JOINT REPLACEMENT  06/14/01   right  . LUMBAR FUSION  2006  . MASS EXCISION  11/08/2011   Procedure: EXCISION MASS;  Surgeon: [name], MD;  Location: [facility] ORS;  Service: General;  Laterality: Left;  Excision Left Thigh Mass  . MASTECTOMY, PARTIAL  2008   GOT PACEMAKER AND DEFIB AT THAT TIME  . PACEMAKER INSERTION  04/23/06  . TOTAL KNEE ARTHROPLASTY  05/17/01   RIGHT KNEE  . TOTAL KNEE ARTHROPLASTY Left 11/29/2014   Procedure: TOTAL LEFT KNEE ARTHROPLASTY;  Surgeon: Paralee Cancel, MD;  Location: [facility] ORS;  Service: Orthopedics;  Laterality: Left;    There were no vitals filed for this visit.      Subjective Assessment - 08/01/16 0847    Subjective No falls, nothing new to report.    Currently in Pain? Yes   Pain Score 6    Pain Location Back   Pain Orientation Lower   Pain Descriptors / Indicators Aching   Pain Type Chronic pain   Pain Onset More than a month ago  [place] Adult PT Treatment/Exercise - 08/01/16 0001      Ambulation/Gait   Ambulation Distance (Feet) 180 Feet   Assistive device None   Gait Pattern Step-through pattern;Decreased arm swing - left;Decreased arm swing - right;Decreased stride length;Decreased trunk rotation;Right flexed knee in stance;Left flexed knee in stance;Antalgic   Gait Comments Gait attempting to relieve back stiffness with pt reporting increasing pain instead     Posture/Postural Control   Posture/Postural Control No significant limitations     Standardized Balance Assessment   Standardized Balance Assessment Berg Balance Test     Berg Balance Test   Sit to Stand Able to stand without using hands and stabilize independently   Standing Unsupported Able to stand safely 2 minutes   Sitting with Back Unsupported but Feet Supported on Floor or Stool Able to sit safely and  securely 2 minutes   Stand to Sit Sits safely with minimal use of hands   Transfers Able to transfer safely, minor use of hands   Standing Unsupported with Eyes Closed Able to stand 10 seconds safely   Standing Ubsupported with Feet Together Able to place feet together independently and stand 1 minute safely   From Standing, Reach Forward with Outstretched Arm Can reach forward >12 cm safely (5")   From Standing Position, Pick up Object from Floor Able to pick up shoe, needs supervision   From Standing Position, Turn to Look Behind Over each Shoulder Looks behind one side only/other side shows less weight shift   Turn 360 Degrees Able to turn 360 degrees safely but slowly   Standing Unsupported, Alternately Place Feet on Step/Stool Able to stand independently and complete 8 steps >20 seconds   Standing Unsupported, One Foot in Front Able to plae foot ahead of the other independently and hold 30 seconds   Standing on One Leg Able to lift leg independently and hold equal to or more than 3 seconds   Total Score 47     Exercises   Exercises Lumbar     Lumbar Exercises: Stretches   Single Knee to Chest Stretch 2 reps;20 seconds   Lower Trunk Rotation 5 reps;20 seconds   Pelvic Tilt --  5 sec; 10 reps   Pelvic Tilt Limitations incr tactile and vc to get correct motion with pelvis             Balance Exercises - 08/01/16 1202      Balance Exercises: Standing   Standing Eyes Opened Narrow base of support (BOS);Wide (BOA);Head turns;Foam/compliant surface;Solid surface  feet apart thru staggered; horiz, vertical   Standing Eyes Closed Narrow base of support (BOS);Wide (BOA);Foam/compliant surface;Solid surface  feet apart thru staggered; horiz, vertical           PT Education - 08/01/16 1205    Education provided Yes   Education Details results of Merrilee Jansky (recommended cane in community); HEP for balance and core strengthening   Person(s) Educated Patient   Methods  Explanation;Demonstration;Tactile cues;Verbal cues;Handout   Comprehension Verbalized understanding;Returned demonstration;Verbal cues required;Tactile cues required;Need further instruction          PT Short Term Goals - 08/01/16 1212      PT SHORT TERM GOAL #1   Title Perform Berg Balance Assessment and set target for LTG. (Target date 08/03/16)  Met 4/25  47/56   Time 1   Period Weeks   Status Achieved           PT Long Term Goals - 08/01/16 1213  PT LONG TERM GOAL #1   Title Patient will verbalize knowledge of fall prevention measures that she could utilize to reduce the risk of falls in her home.   Time 4   Period Weeks   Status On-going     PT LONG TERM GOAL #2   Title Patient will improve Berg score to 50/56 to reflect a decreased risk of falls and improved balance. (Updated 08/01/16)   Time 4   Period Weeks   Status On-going     PT LONG TERM GOAL #3   Title Patient will improve FGA score to >= 25/30 to reflect a decreased risk of falls and improved balance.    Time 4   Period Weeks   Status On-going     PT LONG TERM GOAL #4   Title Patient will be Independent with HEP to address balance and strength deficits.   Time 4   Period Weeks   Status On-going     PT LONG TERM GOAL #5   Title Patient will improve her ABC score to >=24% to reflect an improved sense of balance while completing functional tasks.   Time 4   Period Weeks   Status New               Plan - 08/01/16 1208    Clinical Impression Statement Session focused on further balance assessment Merrilee Jansky) and initiating HEP for balance and strengthening. Patient reported pelvic tilts felt good on her back. Will continue PT to progress towards goals.   Rehab Potential Good   Clinical Impairments Affecting Rehab Potential decr memory    PT Frequency 2x / week   PT Duration 4 weeks   PT Treatment/Interventions ADLs/Self Care Home Management;DME Instruction;Gait training;Stair  training;Functional mobility training;Therapeutic activities;Therapeutic exercise;Balance training;Neuromuscular re-education;Cognitive remediation;Patient/family education;Orthotic Fit/Training   PT Next Visit Plan review fall prevention; review HEP from 4/25; add to HEP (standing balance, LLE strengthening)   PT Home Exercise Plan corner balance ex's on pillows, EC x 30 sec, EO head turns; pelvic tilts, knee to chest, trunk rotation   Consulted and Agree with Plan of Care Patient      Patient will benefit from skilled therapeutic intervention in order to improve the following deficits and impairments:  Abnormal gait, Decreased balance, Decreased cognition, Decreased knowledge of use of DME, Decreased mobility, Decreased strength, Difficulty walking, Impaired sensation  Visit Diagnosis: Muscle weakness (generalized)  Other abnormalities of gait and mobility     Problem List Patient Active Problem List   Diagnosis Date Noted  . Gait abnormality 07/16/2016  . Chronic low back pain 07/16/2016  . Mild cognitive impairment 07/16/2016  . Wrist pain 05/10/2015  . S/P left TKA 11/29/2014  . S/P knee replacement 11/29/2014  . Spontaneous bruising 08/27/2014  . Numbness and tingling in right hand 07/28/2014  . Essential tremor 07/28/2014  . Dizziness 05/28/2014  . Shaky 05/28/2014  . Memory loss 05/28/2014  . Depression 05/06/2014  . Burn of foot, left, second degree 12/11/2011  . Lipoma of left upper thigh 3x5 cm 09/28/2011  . Other primary cardiomyopathies 08/09/2011  . Syncope 08/09/2011  . History of breast cancer T1bNxMx, s/p BCT 2008, triple negative 01/26/2011  . Chronic L breast pain with chronic recurrent seroma, s/p excisional biopsy 01/12/2010 01/26/2011  . Fainted   . ICD (implantable cardioverter-defibrillator), biventricular, in situ 08/02/2010  . Chronic systolic heart failure ([place]) 08/02/2010  . Benign hypertension 08/02/2010    Rexanne Mano, PT 08/01/2016, 12:16  PM  [facility] [address] [place], [place], [address] Phone: [phone]   Fax:  [phone]  Name: [name] MRN: [id number] Date of Birth: [birth year]

## 2016-08-03 ENCOUNTER — Encounter: Payer: Self-pay | Admitting: Physical Therapy

## 2016-08-03 ENCOUNTER — Ambulatory Visit: Payer: Medicare Other | Admitting: Physical Therapy

## 2016-08-03 DIAGNOSIS — M6281 Muscle weakness (generalized): Secondary | ICD-10-CM

## 2016-08-03 DIAGNOSIS — R2689 Other abnormalities of gait and mobility: Secondary | ICD-10-CM

## 2016-08-03 NOTE — Therapy (Signed)
[facility] [address] [place], [place], [address] Phone: [phone]   Fax:  [phone]  Physical Therapy Treatment  Patient Details  Name: [name] MRN: [id number] Date of Birth: 1934-04-02 Referring Provider: Dr. Terrace Arabia  Encounter Date: 08/03/2016      PT End of Session - 08/03/16 1853    Visit Number 3   Number of Visits 9   Date for PT Re-Evaluation 08/24/16   Authorization Time Period 07/26/16 to 09/24/16   PT Start Time 1017   PT Stop Time 1108   PT Time Calculation (min) 51 min   Activity Tolerance Patient tolerated treatment well;No increased pain  monitored back pain throughout   Behavior During Therapy [facility] for tasks assessed/performed      Past Medical History:  Diagnosis Date  . Arthritis   . Cancer [facility])    left breast cancer   . CHF (congestive heart failure) ([facility])    PACEMAKER & DEFIB  . Complication of anesthesia    hypotensive after back surgery in 2006- reports on chart   . Depression   . Dyslipidemia   . Fainted 04/21/06   AT [place]  . GERD (gastroesophageal reflux disease)   . Headache(784.0)   . Hearing loss   . HLD (hyperlipidemia)   . Hypertension   . Hypothyroidism   . ICD (implantable cardiac defibrillator) in place    pt has pacer/icd  . ICD (implantable cardiac defibrillator), biventricular, in situ   . LBBB (left bundle branch block)   . Memory loss   . Nonischemic cardiomyopathy ([facility])   . Normal coronary arteries    s/p cardiac cath 2007  . Pacemaker    Guidant Device  . Syncope   . Systolic CHF ([facility])   . Vertigo   . Wears glasses     Past Surgical History:  Procedure Laterality Date  . BACK SURGERY     lumbar fusion   . BREAST SURGERY  2000   LUMP REMOVAL. STAGE 1 CANCER  . CARDIAC CATHETERIZATION    . CATARACT EXTRACTION    . EYE SURGERY    . IMPLANTABLE CARDIOVERTER DEFIBRILLATOR GENERATOR CHANGE N/A 12/18/2012   Procedure: IMPLANTABLE CARDIOVERTER  DEFIBRILLATOR GENERATOR CHANGE;  Surgeon: Marinus Maw, MD;  Location: [facility] CATH LAB;  Service: Cardiovascular;  Laterality: N/A;  . JOINT REPLACEMENT  06/14/01   right  . LUMBAR FUSION  2006  . MASS EXCISION  11/08/2011   Procedure: EXCISION MASS;  Surgeon: Almond Lint, MD;  Location: [facility];  Service: General;  Laterality: Left;  Excision Left Thigh Mass  . MASTECTOMY, PARTIAL  2008   GOT PACEMAKER AND DEFIB AT THAT TIME  . PACEMAKER INSERTION  04/23/06  . TOTAL KNEE ARTHROPLASTY  05/17/01   RIGHT KNEE  . TOTAL KNEE ARTHROPLASTY Left 11/29/2014   Procedure: TOTAL LEFT KNEE ARTHROPLASTY;  Surgeon: Durene Romans, MD;  Location: [facility];  Service: Orthopedics;  Laterality: Left;    There were no vitals filed for this visit.      Subjective Assessment - 08/03/16 1019    Subjective No falls. Having more leg cramps bil thighs (Rt usually worse, however at times LLE is worse). Felt the trunk rotation stretch was painful to her low back (not just stretching), so she stopped doing it.    Pertinent History lumbar fusion, Lt TKR, Lt breast Ca, OA, CHF, HOH, HTN, ICD, decr memory, vertigo   Currently in Pain? Yes   Pain Score 6  Pain Location Back   Pain Orientation Lower   Pain Descriptors / Indicators Aching   Pain Type Chronic pain   Pain Onset More than a month ago   Pain Frequency Constant                         [facility] Adult PT Treatment/Exercise - 08/03/16 0001      Bed Mobility   Bed Mobility Rolling Right;Right Sidelying to Sit;Sit to Supine   Rolling Right 6: Modified independent (Device/Increase time)   Right Sidelying to Sit 6: Modified independent (Device/Increase time)   Sit to Supine 6: Modified independent (Device/Increase time)     Transfers   Transfers Sit to Stand;Stand to Sit   Sit to Stand 6: Modified independent (Device/Increase time);Without upper extremity assist   Stand to Sit 6: Modified independent (Device/Increase time);Without upper extremity  assist     Ambulation/Gait   Ambulation/Gait Yes   Ambulation/Gait Assistance 7: Independent   Ambulation Distance (Feet) 280 Feet   Assistive device None   Gait Pattern Step-through pattern;Decreased arm swing - left;Decreased arm swing - right;Decreased stride length;Decreased trunk rotation   Ambulation Surface Level;Indoor   Gait Comments Reported incr back pain and LLE pain after 240 ft; still rated 6/10     Posture/Postural Control   Posture/Postural Control No significant limitations     Exercises   Exercises Knee/Hip     Lumbar Exercises: Supine   Clam 10 reps;3 seconds  yellow band @ thighs; one leg abdct at a time   Clam Limitations vc, tactile cues for technique     Knee/Hip Exercises: Standing   Hip Abduction Stengthening;Both;1 set;10 reps   Abduction Limitations reported she felt pull in her back more than exertion in gluteus medius   Forward Step Up Both;5 reps;Hand Hold: 1;Step Height: 6"  denied back pain   SLS bil x 3 up to 10 seconds     Knee/Hip Exercises: Supine   Short Arc Quad Sets Strengthening;Both;2 sets;10 reps   Short Arc Quad Sets Limitations yellow band @ ankles                PT Education - 08/03/16 1103    Education provided Yes   Education Details HEP, fall prevention   Person(s) Educated Patient   Methods Explanation;Demonstration;Tactile cues;Verbal cues;Handout   Comprehension Verbalized understanding;Need further instruction          PT Short Term Goals - 08/01/16 1212      PT SHORT TERM GOAL #1   Title Perform Berg Balance Assessment and set target for LTG. (Target date 08/03/16)  Met 4/25  47/56   Time 1   Period Weeks   Status Achieved           PT Long Term Goals - 08/01/16 1213      PT LONG TERM GOAL #1   Title Patient will verbalize knowledge of fall prevention measures that she could utilize to reduce the risk of falls in her home.   Time 4   Period Weeks   Status On-going     PT LONG TERM GOAL #2    Title Patient will improve Berg score to 50/56 to reflect a decreased risk of falls and improved balance. (Updated 08/01/16)   Time 4   Period Weeks   Status On-going     PT LONG TERM GOAL #3   Title Patient will improve FGA score to >= 25/30 to reflect a decreased risk  of falls and improved balance.    Time 4   Period Weeks   Status On-going     PT LONG TERM GOAL #4   Title Patient will be Independent with HEP to address balance and strength deficits.   Time 4   Period Weeks   Status On-going     PT LONG TERM GOAL #5   Title Patient will improve her ABC score to >=24% to reflect an improved sense of balance while completing functional tasks.   Time 4   Period Weeks   Status New               Plan - 08/03/16 1854    Clinical Impression Statement Session focused on adding to patient's HEP for balance and strengthening. Patient continues to make progress towards goals.   Rehab Potential Good   Clinical Impairments Affecting Rehab Potential decr memory    PT Frequency 2x / week   PT Duration 4 weeks   PT Treatment/Interventions ADLs/Self Care Home Management;DME Instruction;Gait training;Stair training;Functional mobility training;Therapeutic activities;Therapeutic exercise;Balance training;Neuromuscular re-education;Cognitive remediation;Patient/family education;Orthotic Fit/Training   PT Next Visit Plan see if ?'s re: fall prevention handout; review HEP from 4/27; add to HEP keeping in mind she has back pain (standing balance, LLE strengthening)   PT Home Exercise Plan corner balance ex's on pillows, EC x 30 sec, EO head turns; pelvic tilts, knee to chest, trunk rotation   Consulted and Agree with Plan of Care Patient      Patient will benefit from skilled therapeutic intervention in order to improve the following deficits and impairments:  Abnormal gait, Decreased balance, Decreased cognition, Decreased knowledge of use of DME, Decreased mobility, Decreased strength,  Difficulty walking, Impaired sensation  Visit Diagnosis: Muscle weakness (generalized)  Other abnormalities of gait and mobility     Problem List Patient Active Problem List   Diagnosis Date Noted  . Gait abnormality 07/16/2016  . Chronic low back pain 07/16/2016  . Mild cognitive impairment 07/16/2016  . Wrist pain 05/10/2015  . S/P left TKA 11/29/2014  . S/P knee replacement 11/29/2014  . Spontaneous bruising 08/27/2014  . Numbness and tingling in right hand 07/28/2014  . Essential tremor 07/28/2014  . Dizziness 05/28/2014  . Shaky 05/28/2014  . Memory loss 05/28/2014  . Depression 05/06/2014  . Burn of foot, left, second degree 12/11/2011  . Lipoma of left upper thigh 3x5 cm 09/28/2011  . Other primary cardiomyopathies 08/09/2011  . Syncope 08/09/2011  . History of breast cancer T1bNxMx, s/p BCT 2008, triple negative 01/26/2011  . Chronic L breast pain with chronic recurrent seroma, s/p excisional biopsy 01/12/2010 01/26/2011  . Fainted   . ICD (implantable cardioverter-defibrillator), biventricular, in situ 08/02/2010  . Chronic systolic heart failure ([place]) 08/02/2010  . Benign hypertension 08/02/2010    Jeanie Cooks Samier Jaco. PT 08/03/2016, 7:00 PM  [facility] [address] [place], [place], [address] Phone: [phone]   Fax:  [phone]  Name: WILLEEN NOVAK MRN: [id number] Date of Birth: 1934-03-31

## 2016-08-03 NOTE — Patient Instructions (Addendum)
Fall Prevention in the Home Falls can cause injuries. They can happen to people of all ages. There are many things you can do to make your home safe and to help prevent falls. What can I do on the outside of my home?  Regularly fix the edges of walkways and driveways and fix any cracks.  Remove anything that might make you trip as you walk through a door, such as a raised step or threshold.  Trim any bushes or trees on the path to your home.  Use bright outdoor lighting.  Clear any walking paths of anything that might make someone trip, such as rocks or tools.  Regularly check to see if handrails are loose or broken. Make sure that both sides of any steps have handrails.  Any raised decks and porches should have guardrails on the edges.  Have any leaves, snow, or ice cleared regularly.  Use sand or salt on walking paths during winter.  Clean up any spills in your garage right away. This includes oil or grease spills. What can I do in the bathroom?  Use night lights.  Install grab bars by the toilet and in the tub and shower. Do not use towel bars as grab bars.  Use non-skid mats or decals in the tub or shower.  If you need to sit down in the shower, use a plastic, non-slip stool.  Keep the floor dry. Clean up any water that spills on the floor as soon as it happens.  Remove soap buildup in the tub or shower regularly.  Attach bath mats securely with double-sided non-slip rug tape.  Do not have throw rugs and other things on the floor that can make you trip. What can I do in the bedroom?  Use night lights.  Make sure that you have a light by your bed that is easy to reach.  Do not use any sheets or blankets that are too big for your bed. They should not hang down onto the floor.  Have a firm chair that has side arms. You can use this for support while you get dressed.  Do not have throw rugs and other things on the floor that can make you trip. What can I do in  the kitchen?  Clean up any spills right away.  Avoid walking on wet floors.  Keep items that you use a lot in easy-to-reach places.  If you need to reach something above you, use a strong step stool that has a grab bar.  Keep electrical cords out of the way.  Do not use floor polish or wax that makes floors slippery. If you must use wax, use non-skid floor wax.  Do not have throw rugs and other things on the floor that can make you trip. What can I do with my stairs?  Do not leave any items on the stairs.  Make sure that there are handrails on both sides of the stairs and use them. Fix handrails that are broken or loose. Make sure that handrails are as long as the stairways.  Check any carpeting to make sure that it is firmly attached to the stairs. Fix any carpet that is loose or worn.  Avoid having throw rugs at the top or bottom of the stairs. If you do have throw rugs, attach them to the floor with carpet tape.  Make sure that you have a light switch at the top of the stairs and the bottom of the stairs. If you  do not have them, ask someone to add them for you. What else can I do to help prevent falls?  Wear shoes that:  Do not have high heels.  Have rubber bottoms.  Are comfortable and fit you well.  Are closed at the toe. Do not wear sandals.  If you use a stepladder:  Make sure that it is fully opened. Do not climb a closed stepladder.  Make sure that both sides of the stepladder are locked into place.  Ask someone to hold it for you, if possible.  Clearly mark and make sure that you can see:  Any grab bars or handrails.  First and last steps.  Where the edge of each step is.  Use tools that help you move around (mobility aids) if they are needed. These include:  Canes.  Walkers.  Scooters.  Crutches.  Turn on the lights when you go into a dark area. Replace any light bulbs as soon as they burn out.  Set up your furniture so you have a clear  path. Avoid moving your furniture around.  If any of your floors are uneven, fix them.  If there are any pets around you, be aware of where they are.  Review your medicines with your doctor. Some medicines can make you feel dizzy. This can increase your chance of falling. Ask your doctor what other things that you can do to help prevent falls. This information is not intended to replace advice given to you by your health care provider. Make sure you discuss any questions you have with your health care provider. Document Released: 01/20/2009 Document Revised: 09/01/2015 Document Reviewed: 04/30/2014 Elsevier Interactive Patient Education  2017 Elsevier Inc.  Hip Abduction / Adduction: with Knee Flexion (Supine)    With both knees bent with band around thighs, gently lower one knee to side and return.The other leg stays still, pointing straight up to the ceiling. Repeat with opposite leg moving out to the side. Repeat _10___ times each leg per set. Do ___2_ sets per session.  http://orth.exer.us/683   Copyright  VHI. All rights reserved.   Balance: One-Leg    Stand on one leg. Use hand supports (counter, chair back) as lightly as possible. Support as little as possible but as much as needed. Stand __20_ seconds. Repeat, standing on other leg.  Copyright  VHI. All rights reserved.

## 2016-08-04 ENCOUNTER — Other Ambulatory Visit: Payer: Self-pay | Admitting: Internal Medicine

## 2016-08-08 ENCOUNTER — Other Ambulatory Visit: Payer: Self-pay | Admitting: Internal Medicine

## 2016-08-08 ENCOUNTER — Encounter: Payer: Self-pay | Admitting: Physical Therapy

## 2016-08-08 ENCOUNTER — Ambulatory Visit (INDEPENDENT_AMBULATORY_CARE_PROVIDER_SITE_OTHER): Payer: Medicare Other | Admitting: *Deleted

## 2016-08-08 ENCOUNTER — Ambulatory Visit: Payer: Medicare Other | Attending: Neurology | Admitting: Physical Therapy

## 2016-08-08 ENCOUNTER — Telehealth: Payer: Self-pay | Admitting: Cardiology

## 2016-08-08 DIAGNOSIS — M6281 Muscle weakness (generalized): Secondary | ICD-10-CM | POA: Insufficient documentation

## 2016-08-08 DIAGNOSIS — R2689 Other abnormalities of gait and mobility: Secondary | ICD-10-CM | POA: Insufficient documentation

## 2016-08-08 DIAGNOSIS — I428 Other cardiomyopathies: Secondary | ICD-10-CM | POA: Diagnosis not present

## 2016-08-08 NOTE — Patient Instructions (Addendum)
Heel Raises    Stand with support. Tighten abdomen and back and hold. With knees straight, raise heels off ground. Hold _1__ seconds. Return to heels flat slowly.  Repeat __10-20_ times. Do __1_ times a day. Hold on to support as much as needed.   Copyright  VHI. All rights reserved.    ANKLE: Dorsiflexion - Standing    Stand with upright posture. Raise toes of both feet up at same time. Try not to let hips lean backward and make ankles do the work.  __10-20_ reps per set, __1_ sets per day, __5_ days per week Hold onto a support as much as needed.   Copyright  VHI. All rights reserved.

## 2016-08-08 NOTE — Therapy (Signed)
[facility] [address] [place], [place], [address] Phone: [phone]   Fax:  [phone]  Physical Therapy Treatment  Patient Details  Name: [name] MRN: [id number] Date of Birth: 05/30/1933 Referring Provider: Dr. Krista Blue  Encounter Date: 08/08/2016      PT End of Session - 08/08/16 1331    Visit Number 4   Number of Visits 9   Date for PT Re-Evaluation 08/24/16   Authorization Time Period 07/26/16 to 09/24/16   PT Start Time 0846   PT Stop Time 0934   PT Time Calculation (min) 48 min   Activity Tolerance Patient tolerated treatment well;No increased pain  monitored back pain throughout   Behavior During Therapy [facility] for tasks assessed/performed      Past Medical History:  Diagnosis Date  . Arthritis   . Cancer [facility])    left breast cancer   . CHF (congestive heart failure) ([place])    PACEMAKER & DEFIB  . Complication of anesthesia    hypotensive after back surgery in 2006- reports on chart   . Depression   . Dyslipidemia   . Fainted 04/21/06   AT [place]  . GERD (gastroesophageal reflux disease)   . Headache(784.0)   . Hearing loss   . HLD (hyperlipidemia)   . Hypertension   . Hypothyroidism   . ICD (implantable cardiac defibrillator) in place    pt has pacer/icd  . ICD (implantable cardiac defibrillator), biventricular, in situ   . LBBB (left bundle branch block)   . Memory loss   . Nonischemic cardiomyopathy ([place])   . Normal coronary arteries    s/p cardiac cath 2007  . Pacemaker    Guidant Device  . Syncope   . Systolic CHF ([place])   . Vertigo   . Wears glasses     Past Surgical History:  Procedure Laterality Date  . BACK SURGERY     lumbar fusion   . BREAST SURGERY  2000   LUMP REMOVAL. STAGE 1 CANCER  . CARDIAC CATHETERIZATION    . CATARACT EXTRACTION    . EYE SURGERY    . IMPLANTABLE CARDIOVERTER DEFIBRILLATOR GENERATOR CHANGE N/A 12/18/2012   Procedure: IMPLANTABLE CARDIOVERTER  DEFIBRILLATOR GENERATOR CHANGE;  Surgeon: [name], MD;  Location: [facility] CATH LAB;  Service: Cardiovascular;  Laterality: N/A;  . JOINT REPLACEMENT  06/14/01   right  . LUMBAR FUSION  2006  . MASS EXCISION  11/08/2011   Procedure: EXCISION MASS;  Surgeon: [name], MD;  Location: [facility] ORS;  Service: General;  Laterality: Left;  Excision Left Thigh Mass  . MASTECTOMY, PARTIAL  2008   GOT PACEMAKER AND DEFIB AT THAT TIME  . PACEMAKER INSERTION  04/23/06  . TOTAL KNEE ARTHROPLASTY  05/17/01   RIGHT KNEE  . TOTAL KNEE ARTHROPLASTY Left 11/29/2014   Procedure: TOTAL LEFT KNEE ARTHROPLASTY;  Surgeon: Paralee Cancel, MD;  Location: [facility] ORS;  Service: Orthopedics;  Laterality: Left;    There were no vitals filed for this visit.      Subjective Assessment - 08/08/16 0850    Subjective Back is hurting more. Pelvic tilt has been hurting her back. Supine trunk rotation and knee to chest feel good. Doing better with tandem. Took up her scatter rugs.   Pertinent History lumbar fusion, Lt TKR, Lt breast Ca, OA, CHF, HOH, HTN, ICD, decr memory, vertigo   Limitations Reading   Currently in Pain? Yes   Pain Score 7    Pain  Location Back   Pain Orientation Lower   Pain Descriptors / Indicators Aching   Pain Type Chronic pain   Pain Onset More than a month ago   Pain Frequency Constant                         [facility] Adult PT Treatment/Exercise - 08/08/16 0001      Bed Mobility   Bed Mobility Rolling Right;Right Sidelying to Sit;Sit to Supine   Rolling Right 6: Modified independent (Device/Increase time)   Right Sidelying to Sit 6: Modified independent (Device/Increase time)   Sit to Supine 6: Modified independent (Device/Increase time)     Transfers   Transfers Sit to Stand;Stand to Sit   Sit to Stand 6: Modified independent (Device/Increase time);Without upper extremity assist   Stand to Sit 6: Modified independent (Device/Increase time);Without upper extremity assist      Exercises   Exercises Ankle     Lumbar Exercises: Stretches   Single Knee to Chest Stretch 3 reps;30 seconds  bil   Lower Trunk Rotation 30 seconds   Pelvic Tilt 5 reps   Pelvic Tilt Limitations on review pt was lifting into a bridge; re-educated in proper technique with focus on abdominal co-contraction and pt reported no pain     Lumbar Exercises: Sidelying   Clam 5 reps;1 second  RLE on left side with incr back pain     Knee/Hip Exercises: Aerobic   Nustep 3 min L1     Ankle Exercises: Standing   Heel Raises 10 reps   Toe Raise 10 reps             Balance Exercises - 08/08/16 1334      Balance Exercises: Standing   Standing Eyes Opened Narrow base of support (BOS);Head turns;Foam/compliant surface   Standing Eyes Closed Narrow base of support (BOS);Head turns;Foam/compliant surface  10 reps           PT Education - 08/08/16 1330    Education provided Yes   Education Details additions to HEP; how to use lumbar exercises/stretches not only for strengthening but for back pain relief   Person(s) Educated Patient   Methods Explanation;Demonstration;Handout;Verbal cues   Comprehension Verbalized understanding;Returned demonstration;Need further instruction;Verbal cues required          PT Short Term Goals - 08/01/16 1212      PT SHORT TERM GOAL #1   Title Perform Berg Balance Assessment and set target for LTG. (Target date 08/03/16)  Met 4/25  47/56   Time 1   Period Weeks   Status Achieved           PT Long Term Goals - 08/08/16 1337      PT LONG TERM GOAL #1   Title Patient will verbalize knowledge of fall prevention measures that she could utilize to reduce the risk of falls in her home.   MET 08/08/16 Patient reported education helpful and she removed all scatter rugs.   Time 4   Period Weeks   Status Achieved     PT LONG TERM GOAL #2   Title Patient will improve Berg score to 50/56 to reflect a decreased risk of falls and improved balance.  (Updated 08/01/16)   Time 4   Period Weeks   Status On-going     PT LONG TERM GOAL #3   Title Patient will improve FGA score to >= 25/30 to reflect a decreased risk of falls and improved balance.  Time 4   Period Weeks   Status On-going     PT LONG TERM GOAL #4   Title Patient will be Independent with HEP to address balance and strength deficits.   Time 4   Period Weeks   Status On-going     PT LONG TERM GOAL #5   Title Patient will improve her ABC score to >=24% to reflect an improved sense of balance while completing functional tasks.   Time 4   Period Weeks   Status On-going               Plan - 08/08/16 1332    Clinical Impression Statement Session focused on balance and strength training. Again focused on core strengthening for balance and for back pain relief to allow pt to participate in additional activities for balance and gait. Patient progressing with corner balance exercises in her HEP and advanced her to doing head turns with eyes closed.    Rehab Potential Good   Clinical Impairments Affecting Rehab Potential decr memory    PT Frequency 2x / week   PT Duration 4 weeks   PT Treatment/Interventions ADLs/Self Care Home Management;DME Instruction;Gait training;Stair training;Functional mobility training;Therapeutic activities;Therapeutic exercise;Balance training;Neuromuscular re-education;Cognitive remediation;Patient/family education;Orthotic Fit/Training   PT Next Visit Plan check HEP from 5/2; work on balance activities with narrow BOS and compliant surfaces (?cone taps, etc); add to HEP keeping in mind she has back pain (standing balance, LLE strengthening)   PT Home Exercise Plan corner balance ex's on pillows, EC x 30 sec, EC head turns; pelvic tilts, knee to chest, trunk rotation, heel and toe raises   Consulted and Agree with Plan of Care Patient      Patient will benefit from skilled therapeutic intervention in order to improve the following deficits  and impairments:  Abnormal gait, Decreased balance, Decreased cognition, Decreased knowledge of use of DME, Decreased mobility, Decreased strength, Difficulty walking, Impaired sensation  Visit Diagnosis: Muscle weakness (generalized)  Other abnormalities of gait and mobility     Problem List Patient Active Problem List   Diagnosis Date Noted  . Gait abnormality 07/16/2016  . Chronic low back pain 07/16/2016  . Mild cognitive impairment 07/16/2016  . Wrist pain 05/10/2015  . S/P left TKA 11/29/2014  . S/P knee replacement 11/29/2014  . Spontaneous bruising 08/27/2014  . Numbness and tingling in right hand 07/28/2014  . Essential tremor 07/28/2014  . Dizziness 05/28/2014  . Shaky 05/28/2014  . Memory loss 05/28/2014  . Depression 05/06/2014  . Burn of foot, left, second degree 12/11/2011  . Lipoma of left upper thigh 3x5 cm 09/28/2011  . Other primary cardiomyopathies 08/09/2011  . Syncope 08/09/2011  . History of breast cancer T1bNxMx, s/p BCT 2008, triple negative 01/26/2011  . Chronic L breast pain with chronic recurrent seroma, s/p excisional biopsy 01/12/2010 01/26/2011  . Fainted   . ICD (implantable cardioverter-defibrillator), biventricular, in situ 08/02/2010  . Chronic systolic heart failure ([place]) 08/02/2010  . Benign hypertension 08/02/2010    [name], PT 08/08/2016, 1:39 PM  [facility] [address] [place], [place], [address] Phone: [phone]   Fax:  [phone]  Name: [name] MRN: [id number] Date of Birth: [birth year]

## 2016-08-08 NOTE — Telephone Encounter (Signed)
LMOVM reminding pt to send remote transmission.   

## 2016-08-09 ENCOUNTER — Other Ambulatory Visit: Payer: Self-pay | Admitting: Internal Medicine

## 2016-08-09 ENCOUNTER — Encounter: Payer: Self-pay | Admitting: Cardiology

## 2016-08-09 NOTE — Progress Notes (Signed)
Remote ICD transmission.   

## 2016-08-10 ENCOUNTER — Encounter: Payer: Self-pay | Admitting: Physical Therapy

## 2016-08-10 ENCOUNTER — Ambulatory Visit: Payer: Medicare Other | Admitting: Physical Therapy

## 2016-08-10 DIAGNOSIS — M6281 Muscle weakness (generalized): Secondary | ICD-10-CM

## 2016-08-10 DIAGNOSIS — R2689 Other abnormalities of gait and mobility: Secondary | ICD-10-CM

## 2016-08-10 LAB — CUP PACEART REMOTE DEVICE CHECK
Battery Remaining Longevity: 84 mo
Battery Remaining Percentage: 100 %
Brady Statistic RA Percent Paced: 8 %
Brady Statistic RV Percent Paced: 100 %
Date Time Interrogation Session: 20180502161400
HighPow Impedance: 50 Ohm
Implantable Lead Implant Date: 20080116
Implantable Lead Implant Date: 20080116
Implantable Lead Implant Date: 20080116
Implantable Lead Location: 753859
Implantable Lead Location: 753860
Implantable Lead Location: 753860
Implantable Lead Model: 157
Implantable Lead Model: 4469
Implantable Lead Model: 4555
Implantable Lead Serial Number: 136532
Implantable Lead Serial Number: 161542
Implantable Lead Serial Number: 473495
Implantable Pulse Generator Implant Date: 20140911
Lead Channel Impedance Value: 396 Ohm
Lead Channel Impedance Value: 577 Ohm
Lead Channel Impedance Value: 807 Ohm
Lead Channel Pacing Threshold Amplitude: 0.4 V
Lead Channel Pacing Threshold Amplitude: 0.7 V
Lead Channel Pacing Threshold Amplitude: 0.9 V
Lead Channel Pacing Threshold Pulse Width: 0.4 ms
Lead Channel Pacing Threshold Pulse Width: 0.4 ms
Lead Channel Pacing Threshold Pulse Width: 0.8 ms
Lead Channel Setting Pacing Amplitude: 2 V
Lead Channel Setting Pacing Amplitude: 2 V
Lead Channel Setting Pacing Amplitude: 2.4 V
Lead Channel Setting Pacing Pulse Width: 0.4 ms
Lead Channel Setting Pacing Pulse Width: 0.8 ms
Lead Channel Setting Sensing Sensitivity: 0.6 mV
Lead Channel Setting Sensing Sensitivity: 1 mV
Pulse Gen Serial Number: 111765

## 2016-08-10 NOTE — Therapy (Signed)
[facility] [address] [place], [place], [address] Phone: [phone]   Fax:  [phone]  Physical Therapy Treatment  Patient Details  Name: [name] MRN: [id number] Date of Birth: 1933/11/30 Referring Provider: Dr. Krista Blue  Encounter Date: 08/10/2016      PT End of Session - 08/10/16 1924    Visit Number 5   Number of Visits 9   Date for PT Re-Evaluation 08/24/16   Authorization Time Period 07/26/16 to 09/24/16   PT Start Time 5397   PT Stop Time 6734   PT Time Calculation (min) 40 min   Activity Tolerance Patient tolerated treatment well;No increased pain  monitored back pain throughout   Behavior During Therapy [facility] for tasks assessed/performed      Past Medical History:  Diagnosis Date  . Arthritis   . Cancer [facility])    left breast cancer   . CHF (congestive heart failure) ([place])    PACEMAKER & DEFIB  . Complication of anesthesia    hypotensive after back surgery in 2006- reports on chart   . Depression   . Dyslipidemia   . Fainted 04/21/06   AT [place]  . GERD (gastroesophageal reflux disease)   . Headache(784.0)   . Hearing loss   . HLD (hyperlipidemia)   . Hypertension   . Hypothyroidism   . ICD (implantable cardiac defibrillator) in place    pt has pacer/icd  . ICD (implantable cardiac defibrillator), biventricular, in situ   . LBBB (left bundle branch block)   . Memory loss   . Nonischemic cardiomyopathy ([place])   . Normal coronary arteries    s/p cardiac cath 2007  . Pacemaker    Guidant Device  . Syncope   . Systolic CHF ([place])   . Vertigo   . Wears glasses     Past Surgical History:  Procedure Laterality Date  . BACK SURGERY     lumbar fusion   . BREAST SURGERY  2000   LUMP REMOVAL. STAGE 1 CANCER  . CARDIAC CATHETERIZATION    . CATARACT EXTRACTION    . EYE SURGERY    . IMPLANTABLE CARDIOVERTER DEFIBRILLATOR GENERATOR CHANGE N/A 12/18/2012   Procedure: IMPLANTABLE CARDIOVERTER  DEFIBRILLATOR GENERATOR CHANGE;  Surgeon: [name], MD;  Location: [facility] CATH LAB;  Service: Cardiovascular;  Laterality: N/A;  . JOINT REPLACEMENT  06/14/01   right  . LUMBAR FUSION  2006  . MASS EXCISION  11/08/2011   Procedure: EXCISION MASS;  Surgeon: [name], MD;  Location: [facility]S;  Service: General;  Laterality: Left;  Excision Left Thigh Mass  . MASTECTOMY, PARTIAL  2008   GOT PACEMAKER AND DEFIB AT THAT TIME  . PACEMAKER INSERTION  04/23/06  . TOTAL KNEE ARTHROPLASTY  05/17/01   RIGHT KNEE  . TOTAL KNEE ARTHROPLASTY Left 11/29/2014   Procedure: TOTAL LEFT KNEE ARTHROPLASTY;  Surgeon: Paralee Cancel, MD;  Location: [facility] ORS;  Service: Orthopedics;  Laterality: Left;    There were no vitals filed for this visit.      Subjective Assessment - 08/10/16 1406    Subjective Yesterday had bad cramps (back and down legs). Tried stretches without much relief.    Pertinent History lumbar fusion, Lt TKR, Lt breast Ca, OA, CHF, HOH, HTN, ICD, decr memory, vertigo   Limitations Reading   Currently in Pain? Yes   Pain Score 6    Pain Location Back   Pain Descriptors / Indicators Aching   Pain Type Chronic pain  Pain Onset More than a month ago                         [facility] Adult PT Treatment/Exercise - 08/10/16 1409      Knee/Hip Exercises: Aerobic   Nustep L3 3 min     Ankle Exercises: Standing   Heel Raises 10 reps   Toe Raise 10 reps   Toe Walk (Round Trip) parallel bars x 3 lengths             Balance Exercises - 08/10/16 1919      Balance Exercises: Standing   Tandem Stance Eyes open;2 reps;10 secs;15 secs   SLS Eyes open;Intermittent upper extremity support;2 reps;10 secs   SLS with Vectors Foam/compliant surface;Intermittent upper extremity assist  alternating single, double taps cones, then foam bubbles   Rockerboard Anterior/posterior;Lateral;Head turns;EO;30 seconds;Intermittent UE support   Balance Beam black beam; standing crosswise x 30  seconds eyes open and head turns; alternating step down fwd, then backwd with step back onto beam   Tandem Gait Forward;2 reps           PT Education - 08/10/16 1923    Education provided Yes   Education Details additions to [employer]) Educated Patient   Methods Explanation;Demonstration;Verbal cues;Handout   Comprehension Verbalized understanding;Returned demonstration          PT Short Term Goals - 08/01/16 1212      PT SHORT TERM GOAL #1   Title Perform Berg Balance Assessment and set target for LTG. (Target date 08/03/16)  Met 4/25  47/56   Time 1   Period Weeks   Status Achieved           PT Long Term Goals - 08/08/16 1337      PT LONG TERM GOAL #1   Title Patient will verbalize knowledge of fall prevention measures that she could utilize to reduce the risk of falls in her home.   MET 08/08/16 Patient reported education helpful and she removed all scatter rugs.   Time 4   Period Weeks   Status Achieved     PT LONG TERM GOAL #2   Title Patient will improve Berg score to 50/56 to reflect a decreased risk of falls and improved balance. (Updated 08/01/16)   Time 4   Period Weeks   Status On-going     PT LONG TERM GOAL #3   Title Patient will improve FGA score to >= 25/30 to reflect a decreased risk of falls and improved balance.    Time 4   Period Weeks   Status On-going     PT LONG TERM GOAL #4   Title Patient will be Independent with HEP to address balance and strength deficits.   Time 4   Period Weeks   Status On-going     PT LONG TERM GOAL #5   Title Patient will improve her ABC score to >=24% to reflect an improved sense of balance while completing functional tasks.   Time 4   Period Weeks   Status On-going               Plan - 08/10/16 1924    Clinical Impression Statement Session focused on balance and strengthening. Patient tolerated increased activities without incr back pain. Continuing to progress towards goals.   Rehab Potential  Good   Clinical Impairments Affecting Rehab Potential decr memory    PT Frequency 2x / week   PT  Duration 4 weeks   PT Treatment/Interventions ADLs/Self Care Home Management;DME Instruction;Gait training;Stair training;Functional mobility training;Therapeutic activities;Therapeutic exercise;Balance training;Neuromuscular re-education;Cognitive remediation;Patient/family education;Orthotic Fit/Training   PT Next Visit Plan work on balance activities with narrow BOS and compliant surfaces; gait with head turns, dual task   PT Home Exercise Plan corner balance ex's on pillows, EC x 30 sec, EC head turns; pelvic tilts, knee to chest, trunk rotation, heel and toe raises; toe walking   Consulted and Agree with Plan of Care Patient      Patient will benefit from skilled therapeutic intervention in order to improve the following deficits and impairments:  Abnormal gait, Decreased balance, Decreased cognition, Decreased knowledge of use of DME, Decreased mobility, Decreased strength, Difficulty walking, Impaired sensation  Visit Diagnosis: Muscle weakness (generalized)  Other abnormalities of gait and mobility     Problem List Patient Active Problem List   Diagnosis Date Noted  . Gait abnormality 07/16/2016  . Chronic low back pain 07/16/2016  . Mild cognitive impairment 07/16/2016  . Wrist pain 05/10/2015  . S/P left TKA 11/29/2014  . S/P knee replacement 11/29/2014  . Spontaneous bruising 08/27/2014  . Numbness and tingling in right hand 07/28/2014  . Essential tremor 07/28/2014  . Dizziness 05/28/2014  . Shaky 05/28/2014  . Memory loss 05/28/2014  . Depression 05/06/2014  . Burn of foot, left, second degree 12/11/2011  . Lipoma of left upper thigh 3x5 cm 09/28/2011  . Other primary cardiomyopathies 08/09/2011  . Syncope 08/09/2011  . History of breast cancer T1bNxMx, s/p BCT 2008, triple negative 01/26/2011  . Chronic L breast pain with chronic recurrent seroma, s/p excisional  biopsy 01/12/2010 01/26/2011  . Fainted   . ICD (implantable cardioverter-defibrillator), biventricular, in situ 08/02/2010  . Chronic systolic heart failure ([place]) 08/02/2010  . Benign hypertension 08/02/2010    Rexanne Mano, PT 08/10/2016, 7:28 PM  [facility] [address] [place], [place], [address] Phone: [phone]   Fax:  [phone]  Name: [name] MRN: [id number] Date of Birth: 21-Jan-1934

## 2016-08-10 NOTE — Patient Instructions (Signed)
FUNCTIONAL MOBILITY: Toe Walking    Walk forward on toes. __6_ lengths along counter, __1  sets per day, _5__ days per week Use assistive device.  Copyright  VHI. All rights reserved.

## 2016-08-13 ENCOUNTER — Ambulatory Visit: Payer: Medicare Other | Admitting: Physical Therapy

## 2016-08-13 ENCOUNTER — Encounter: Payer: Self-pay | Admitting: Physical Therapy

## 2016-08-13 DIAGNOSIS — M6281 Muscle weakness (generalized): Secondary | ICD-10-CM | POA: Diagnosis not present

## 2016-08-13 DIAGNOSIS — R2689 Other abnormalities of gait and mobility: Secondary | ICD-10-CM

## 2016-08-13 NOTE — Therapy (Signed)
[facility] [address] [place], [place], [address] Phone: [phone]   Fax:  [phone]  Physical Therapy Treatment  Patient Details  Name: [name] MRN: [id number] Date of Birth: 02/03/1934 Referring Provider: Dr. Krista Blue  Encounter Date: 08/13/2016      PT End of Session - 08/13/16 1300    Visit Number 6   Number of Visits 9   Date for PT Re-Evaluation 08/24/16   Authorization Time Period 07/26/16 to 09/24/16   PT Start Time 0930   PT Stop Time 1016   PT Time Calculation (min) 46 min   Activity Tolerance Patient tolerated treatment well  monitored back pain throughout   Behavior During Therapy [facility] for tasks assessed/performed      Past Medical History:  Diagnosis Date  . Arthritis   . Cancer [facility])    left breast cancer   . CHF (congestive heart failure) ([place])    PACEMAKER & DEFIB  . Complication of anesthesia    hypotensive after back surgery in 2006- reports on chart   . Depression   . Dyslipidemia   . Fainted 04/21/06   AT [place]  . GERD (gastroesophageal reflux disease)   . Headache(784.0)   . Hearing loss   . HLD (hyperlipidemia)   . Hypertension   . Hypothyroidism   . ICD (implantable cardiac defibrillator) in place    pt has pacer/icd  . ICD (implantable cardiac defibrillator), biventricular, in situ   . LBBB (left bundle branch block)   . Memory loss   . Nonischemic cardiomyopathy ([place])   . Normal coronary arteries    s/p cardiac cath 2007  . Pacemaker    Guidant Device  . Syncope   . Systolic CHF ([place])   . Vertigo   . Wears glasses     Past Surgical History:  Procedure Laterality Date  . BACK SURGERY     lumbar fusion   . BREAST SURGERY  2000   LUMP REMOVAL. STAGE 1 CANCER  . CARDIAC CATHETERIZATION    . CATARACT EXTRACTION    . EYE SURGERY    . IMPLANTABLE CARDIOVERTER DEFIBRILLATOR GENERATOR CHANGE N/A 12/18/2012   Procedure: IMPLANTABLE CARDIOVERTER DEFIBRILLATOR  GENERATOR CHANGE;  Surgeon: [name], MD;  Location: [facility] CATH LAB;  Service: Cardiovascular;  Laterality: N/A;  . JOINT REPLACEMENT  06/14/01   right  . LUMBAR FUSION  2006  . MASS EXCISION  11/08/2011   Procedure: EXCISION MASS;  Surgeon: [name], MD;  Location: [facility] ORS;  Service: General;  Laterality: Left;  Excision Left Thigh Mass  . MASTECTOMY, PARTIAL  2008   GOT PACEMAKER AND DEFIB AT THAT TIME  . PACEMAKER INSERTION  04/23/06  . TOTAL KNEE ARTHROPLASTY  05/17/01   RIGHT KNEE  . TOTAL KNEE ARTHROPLASTY Left 11/29/2014   Procedure: TOTAL LEFT KNEE ARTHROPLASTY;  Surgeon: Paralee Cancel, MD;  Location: [facility] ORS;  Service: Orthopedics;  Laterality: Left;    There were no vitals filed for this visit.      Subjective Assessment - 08/13/16 0927    Subjective Hard weekend with her back. Pain would double-up at times.    Pertinent History lumbar fusion, Lt TKR, Lt breast Ca, OA, CHF, HOH, HTN, ICD, decr memory, vertigo   Limitations Reading   Currently in Pain? Yes   Pain Score 6    Pain Location Back   Pain Orientation Lower   Pain Descriptors / Indicators Aching   Pain Type Chronic pain  Pain Onset More than a month ago                         [facility] Adult PT Treatment/Exercise - 08/13/16 0932      Bed Mobility   Bed Mobility Rolling Right;Right Sidelying to Sit;Sit to Supine   Rolling Right 6: Modified independent (Device/Increase time)   Right Sidelying to Sit 6: Modified independent (Device/Increase time)   Sit to Supine 6: Modified independent (Device/Increase time)     Transfers   Transfers Sit to Stand;Stand to Sit   Sit to Stand 6: Modified independent (Device/Increase time);Without upper extremity assist   Stand to Sit 6: Modified independent (Device/Increase time);Without upper extremity assist     Ambulation/Gait   Ambulation/Gait Assistance 5: Supervision   Ambulation/Gait Assistance Details with head turns (incr difficulty looking left) horiz  and vertical; passing bean bag between her hands; passing bean bag back over her shoulder and retrieving from by her opposite hip (PT walking behind); pt noted to widen her BOS and decr velocity when increasing challenges added to her walking   Ambulation Distance (Feet) 500 Feet   Assistive device None   Gait Pattern Step-through pattern;Decreased arm swing - left;Decreased arm swing - right;Decreased stride length;Decreased trunk rotation;Wide base of support  drifts to her left   Ambulation Surface Level;Indoor     Lumbar Exercises: Stretches   Active Hamstring Stretch Limitations nerve gliding in supine/hookliying; bil x 5   Single Knee to Chest Stretch 3 reps;30 seconds   Lower Trunk Rotation 2 reps;30 seconds   Pelvic Tilt Other (comment)  8 reps with increasing Rt thigh pain             Balance Exercises - 08/13/16 1254      Balance Exercises: Standing   Wall Bumps Hip   Wall Bumps-Hips Eyes opened;Anterior/posterior;Foam/compliant surface;10 reps   Rockerboard Anterior/posterior;Head turns;EO;EC;30 seconds;10 reps;Other (comment)  perturbations with ankle strategy elicited; attempts at hip    Balance Beam blue beam; crosswise x 30 sec EC; step on/off alternating feet x 5 each fwd/backward   Tandem Gait Forward;3 reps   Sidestepping Upper extremity support  at counter; on/off blue airex and over 4" obstacles           PT Education - 08/13/16 1259    Education provided Yes   Education Details using back stretches when her pain increases to see if it will help decr (she continues to forget to try this)   Person(s) Educated Patient   Methods Explanation;Demonstration   Comprehension Verbalized understanding          PT Short Term Goals - 08/01/16 1212      PT SHORT TERM GOAL #1   Title Perform Berg Balance Assessment and set target for LTG. (Target date 08/03/16)  Met 4/25  47/56   Time 1   Period Weeks   Status Achieved           PT Long Term Goals -  08/08/16 1337      PT LONG TERM GOAL #1   Title Patient will verbalize knowledge of fall prevention measures that she could utilize to reduce the risk of falls in her home.   MET 08/08/16 Patient reported education helpful and she removed all scatter rugs.   Time 4   Period Weeks   Status Achieved     PT LONG TERM GOAL #2   Title Patient will improve Berg score to 50/56 to reflect  a decreased risk of falls and improved balance. (Updated 08/01/16)   Time 4   Period Weeks   Status On-going     PT LONG TERM GOAL #3   Title Patient will improve FGA score to >= 25/30 to reflect a decreased risk of falls and improved balance.    Time 4   Period Weeks   Status On-going     PT LONG TERM GOAL #4   Title Patient will be Independent with HEP to address balance and strength deficits.   Time 4   Period Weeks   Status On-going     PT LONG TERM GOAL #5   Title Patient will improve her ABC score to >=24% to reflect an improved sense of balance while completing functional tasks.   Time 4   Period Weeks   Status On-going               Plan - 08/13/16 1300    Clinical Impression Statement Session focused on dynamic balance and gait activities and core strengthening. Did have increasing back pain (up to 7/10) with incr walking distance and with pelvic tilts. Making slow progress towards goals.   Rehab Potential Good   Clinical Impairments Affecting Rehab Potential decr memory    PT Frequency 2x / week   PT Duration 4 weeks   PT Treatment/Interventions ADLs/Self Care Home Management;DME Instruction;Gait training;Stair training;Functional mobility training;Therapeutic activities;Therapeutic exercise;Balance training;Neuromuscular re-education;Cognitive remediation;Patient/family education;Orthotic Fit/Training   PT Next Visit Plan work on balance activities with narrow BOS, head turns, compliant surfaces; gait with head turns, dual task   PT Home Exercise Plan corner balance ex's on pillows,  EC x 30 sec, EC head turns; pelvic tilts, knee to chest, trunk rotation, heel and toe raises; toe walking   Consulted and Agree with Plan of Care Patient      Patient will benefit from skilled therapeutic intervention in order to improve the following deficits and impairments:  Abnormal gait, Decreased balance, Decreased cognition, Decreased knowledge of use of DME, Decreased mobility, Decreased strength, Difficulty walking, Impaired sensation  Visit Diagnosis: Muscle weakness (generalized)  Other abnormalities of gait and mobility     Problem List Patient Active Problem List   Diagnosis Date Noted  . Gait abnormality 07/16/2016  . Chronic low back pain 07/16/2016  . Mild cognitive impairment 07/16/2016  . Wrist pain 05/10/2015  . S/P left TKA 11/29/2014  . S/P knee replacement 11/29/2014  . Spontaneous bruising 08/27/2014  . Numbness and tingling in right hand 07/28/2014  . Essential tremor 07/28/2014  . Dizziness 05/28/2014  . Shaky 05/28/2014  . Memory loss 05/28/2014  . Depression 05/06/2014  . Burn of foot, left, second degree 12/11/2011  . Lipoma of left upper thigh 3x5 cm 09/28/2011  . Other primary cardiomyopathies 08/09/2011  . Syncope 08/09/2011  . History of breast cancer T1bNxMx, s/p BCT 2008, triple negative 01/26/2011  . Chronic L breast pain with chronic recurrent seroma, s/p excisional biopsy 01/12/2010 01/26/2011  . Fainted   . ICD (implantable cardioverter-defibrillator), biventricular, in situ 08/02/2010  . Chronic systolic heart failure ([place]) 08/02/2010  . Benign hypertension 08/02/2010    Rexanne Mano, PT 08/13/2016, 5:03 PM  [facility] [address] [place], [place], [address] Phone: [phone]   Fax:  [phone]  Name: [name] MRN: [id number] Date of Birth: [birth year]

## 2016-08-15 ENCOUNTER — Ambulatory Visit: Payer: Medicare Other | Admitting: Physical Therapy

## 2016-08-17 ENCOUNTER — Encounter: Payer: Self-pay | Admitting: Physical Therapy

## 2016-08-17 ENCOUNTER — Telehealth: Payer: Self-pay

## 2016-08-17 ENCOUNTER — Ambulatory Visit: Payer: Medicare Other | Admitting: Physical Therapy

## 2016-08-17 DIAGNOSIS — M6281 Muscle weakness (generalized): Secondary | ICD-10-CM

## 2016-08-17 DIAGNOSIS — R2689 Other abnormalities of gait and mobility: Secondary | ICD-10-CM

## 2016-08-17 NOTE — Telephone Encounter (Signed)
Called pharmacy stated that there is a new Agricultural consultant for Oxcarbazepine and pharmacy must get clearance from the provider to be able to deliver to patient.

## 2016-08-17 NOTE — Telephone Encounter (Signed)
This is ok

## 2016-08-17 NOTE — Therapy (Signed)
[facility] [address] [place], [place], [address] Phone: [phone]   Fax:  [phone]  Physical Therapy Treatment  Patient Details  Name: [name] MRN: [id number] Date of Birth: [birth year] Referring Provider: Dr. Krista Blue  Encounter Date: 08/17/2016      PT End of Session - 08/17/16 1916    Visit Number 7   Number of Visits 9   Date for PT Re-Evaluation 08/24/16   Authorization Time Period 07/26/16 to 09/24/16   PT Start Time 1019   PT Stop Time 1101   PT Time Calculation (min) 42 min   Activity Tolerance Patient tolerated treatment well  monitored back pain throughout   Behavior During Therapy [facility] for tasks assessed/performed      Past Medical History:  Diagnosis Date  . Arthritis   . Cancer [facility])    left breast cancer   . CHF (congestive heart failure) ([place])    PACEMAKER & DEFIB  . Complication of anesthesia    hypotensive after back surgery in 2006- reports on chart   . Depression   . Dyslipidemia   . Fainted 04/21/06   AT [place]  . GERD (gastroesophageal reflux disease)   . Headache(784.0)   . Hearing loss   . HLD (hyperlipidemia)   . Hypertension   . Hypothyroidism   . ICD (implantable cardiac defibrillator) in place    pt has pacer/icd  . ICD (implantable cardiac defibrillator), biventricular, in situ   . LBBB (left bundle branch block)   . Memory loss   . Nonischemic cardiomyopathy ([place])   . Normal coronary arteries    s/p cardiac cath 2007  . Pacemaker    Guidant Device  . Syncope   . Systolic CHF ([place])   . Vertigo   . Wears glasses     Past Surgical History:  Procedure Laterality Date  . BACK SURGERY     lumbar fusion   . BREAST SURGERY  2000   LUMP REMOVAL. STAGE 1 CANCER  . CARDIAC CATHETERIZATION    . CATARACT EXTRACTION    . EYE SURGERY    . IMPLANTABLE CARDIOVERTER DEFIBRILLATOR GENERATOR CHANGE N/A 12/18/2012   Procedure: IMPLANTABLE CARDIOVERTER DEFIBRILLATOR  GENERATOR CHANGE;  Surgeon: [name], MD;  Location: [facility] CATH LAB;  Service: Cardiovascular;  Laterality: N/A;  . JOINT REPLACEMENT  06/14/01   right  . LUMBAR FUSION  2006  . MASS EXCISION  11/08/2011   Procedure: EXCISION MASS;  Surgeon: [name], MD;  Location: [facility] ORS;  Service: General;  Laterality: Left;  Excision Left Thigh Mass  . MASTECTOMY, PARTIAL  2008   GOT PACEMAKER AND DEFIB AT THAT TIME  . PACEMAKER INSERTION  04/23/06  . TOTAL KNEE ARTHROPLASTY  05/17/01   RIGHT KNEE  . TOTAL KNEE ARTHROPLASTY Left 11/29/2014   Procedure: TOTAL LEFT KNEE ARTHROPLASTY;  Surgeon: Paralee Cancel, MD;  Location: [facility] ORS;  Service: Orthopedics;  Laterality: Left;    There were no vitals filed for this visit.      Subjective Assessment - 08/17/16 1023    Subjective Staggered this morning when trying to hurry. did not fall or have to use external support to catch herself   Pertinent History lumbar fusion, Lt TKR, Lt breast Ca, OA, CHF, HOH, HTN, ICD, decr memory, vertigo   Limitations Reading   Currently in Pain? Yes   Pain Score 6    Pain Location Back   Pain Orientation Lower   Pain Descriptors /  Indicators Aching   Pain Onset More than a month ago                         [facility] Adult PT Treatment/Exercise - 08/17/16 0001      Ambulation/Gait   Ambulation/Gait Assistance 5: Supervision   Ambulation/Gait Assistance Details with turns and noted pt's shoes (new) catching against one another at forefoot (due to narrow gait) and tripping her   Ambulation Distance (Feet) 180 Feet   Assistive device None   Gait Pattern Step-through pattern;Decreased arm swing - left;Decreased arm swing - right;Decreased stride length;Decreased trunk rotation;Narrow base of support   Ambulation Surface Level;Indoor     Lumbar Exercises: Stretches   Pelvic Tilt --  10 reps in sitting with anterior pelvic tilt to incr back ex   Standing Extension --  10 reps, at counter, UE support, leaning  hips forward   Standing Extension Limitations vc not to do UE "push ups" her hips lean into counter while shoulders are back   Prone on Elbows Stretch 1 rep  2 minutes including mini-pressups x 5 reps   Prone on Elbows Stretch Limitations reported no posterior thigh pain; pain localized to her low back   Quadruped Mid Back Stretch 3 reps  quadruped on mat table; "old horse" "mad cat"    Quadruped Mid Back Stretch Limitations limited by wrist pain      Knee/Hip Exercises: Aerobic   Nustep L3, 3 min             Balance Exercises - 08/17/16 1911      Balance Exercises: Standing   Wall Bumps-Hips Eyes closed;Anterior/posterior;10 reps   Rockerboard Anterior/posterior;EO;10 reps  step off alt feet anteriorly then posteriorly   Tandem Gait Forward;Intermittent upper extremity support  at counter x 3 lengths     OTAGO PROGRAM   Heel Walking No support   Toe Walk No support           PT Education - 08/17/16 1915    Education provided Yes   Education Details rationale for increasing lumbar extension (if does not increase thigh pain); additions to HEP; recommended she return her new sneakers as witnessed her tripping over her own left forefoot b/c shoes rub against each other and rubber sole "grabs"   Person(s) Educated Patient   Methods Explanation;Demonstration;Verbal cues   Comprehension Verbalized understanding;Returned demonstration;Need further instruction          PT Short Term Goals - 08/01/16 1212      PT SHORT TERM GOAL #1   Title Perform Berg Balance Assessment and set target for LTG. (Target date 08/03/16)  Met 4/25  47/56   Time 1   Period Weeks   Status Achieved           PT Long Term Goals - 08/08/16 1337      PT LONG TERM GOAL #1   Title Patient will verbalize knowledge of fall prevention measures that she could utilize to reduce the risk of falls in her home.   MET 08/08/16 Patient reported education helpful and she removed all scatter rugs.   Time  4   Period Weeks   Status Achieved     PT LONG TERM GOAL #2   Title Patient will improve Berg score to 50/56 to reflect a decreased risk of falls and improved balance. (Updated 08/01/16)   Time 4   Period Weeks   Status On-going     PT LONG  TERM GOAL #3   Title Patient will improve FGA score to >= 25/30 to reflect a decreased risk of falls and improved balance.    Time 4   Period Weeks   Status On-going     PT LONG TERM GOAL #4   Title Patient will be Independent with HEP to address balance and strength deficits.   Time 4   Period Weeks   Status On-going     PT LONG TERM GOAL #5   Title Patient will improve her ABC score to >=24% to reflect an improved sense of balance while completing functional tasks.   Time 4   Period Weeks   Status On-going               Plan - 08/17/16 1916    Clinical Impression Statement Session focused on core strengthening and lumbar stretching to attempt decreasing bil thigh pain which limits all her activities. Continued to focus on balance training and strengthening. Patient feels her balance has improved (with exception of today's episodes where her shoes caught each other causing imbalance), however continues to feel limited in her walking due to back pain.    Rehab Potential Good   Clinical Impairments Affecting Rehab Potential decr memory    PT Frequency 2x / week   PT Duration 4 weeks   PT Treatment/Interventions ADLs/Self Care Home Management;DME Instruction;Gait training;Stair training;Functional mobility training;Therapeutic activities;Therapeutic exercise;Balance training;Neuromuscular re-education;Cognitive remediation;Patient/family education;Orthotic Fit/Training   PT Next Visit Plan check to see if she worked on back extension and how effected her leg/back pain; try stand on step sideways, drop/lower leg down toward floor and pull pelvis back up level; try standing abdct (?replace supine clams in HEP)  compliant surfaces; gait with  head turns, dual task   PT Home Exercise Plan corner balance ex's on pillows, EC x 30 sec, EC head turns; pelvic tilts, knee to chest, trunk rotation, heel and toe raises; toe walking   Consulted and Agree with Plan of Care Patient      Patient will benefit from skilled therapeutic intervention in order to improve the following deficits and impairments:  Abnormal gait, Decreased balance, Decreased cognition, Decreased knowledge of use of DME, Decreased mobility, Decreased strength, Difficulty walking, Impaired sensation  Visit Diagnosis: Muscle weakness (generalized)  Other abnormalities of gait and mobility     Problem List Patient Active Problem List   Diagnosis Date Noted  . Gait abnormality 07/16/2016  . Chronic low back pain 07/16/2016  . Mild cognitive impairment 07/16/2016  . Wrist pain 05/10/2015  . S/P left TKA 11/29/2014  . S/P knee replacement 11/29/2014  . Spontaneous bruising 08/27/2014  . Numbness and tingling in right hand 07/28/2014  . Essential tremor 07/28/2014  . Dizziness 05/28/2014  . Shaky 05/28/2014  . Memory loss 05/28/2014  . Depression 05/06/2014  . Burn of foot, left, second degree 12/11/2011  . Lipoma of left upper thigh 3x5 cm 09/28/2011  . Other primary cardiomyopathies 08/09/2011  . Syncope 08/09/2011  . History of breast cancer T1bNxMx, s/p BCT 2008, triple negative 01/26/2011  . Chronic L breast pain with chronic recurrent seroma, s/p excisional biopsy 01/12/2010 01/26/2011  . Fainted   . ICD (implantable cardioverter-defibrillator), biventricular, in situ 08/02/2010  . Chronic systolic heart failure ([place]) 08/02/2010  . Benign hypertension 08/02/2010    Rexanne Mano, PT 08/17/2016, 7:30 PM  [facility] [address] [place], [place], [address] Phone: [phone]   Fax:  [phone]  Name: DENETRIA LUEVANOS MRN: [id number] Date of Birth: [birth year]

## 2016-08-20 ENCOUNTER — Encounter: Payer: Self-pay | Admitting: Physical Therapy

## 2016-08-20 ENCOUNTER — Ambulatory Visit: Payer: Medicare Other | Admitting: Physical Therapy

## 2016-08-20 DIAGNOSIS — M6281 Muscle weakness (generalized): Secondary | ICD-10-CM | POA: Diagnosis not present

## 2016-08-20 DIAGNOSIS — R2689 Other abnormalities of gait and mobility: Secondary | ICD-10-CM

## 2016-08-20 NOTE — Therapy (Signed)
[name] [address] [place], [place], [address] Phone: [phone]   Fax:  [phone]  Physical Therapy Treatment  Patient Details  Name: [name] MRN: [id number] Date of Birth: 1933-10-15 Referring Provider: Dr. Krista Blue  Encounter Date: 08/20/2016      PT End of Session - 08/20/16 2038    Visit Number 8   Number of Visits 9   Date for PT Re-Evaluation 08/24/16   Authorization Time Period 07/26/16 to 09/24/16   PT Start Time 1108   PT Stop Time 1148   PT Time Calculation (min) 40 min   Activity Tolerance Patient tolerated treatment well  monitored back pain throughout   Behavior During Therapy [facility] for tasks assessed/performed      Past Medical History:  Diagnosis Date  . Arthritis   . Cancer [facility])    left breast cancer   . CHF (congestive heart failure) ([place])    PACEMAKER & DEFIB  . Complication of anesthesia    hypotensive after back surgery in 2006- reports on chart   . Depression   . Dyslipidemia   . Fainted 04/21/06   AT [place]  . GERD (gastroesophageal reflux disease)   . Headache(784.0)   . Hearing loss   . HLD (hyperlipidemia)   . Hypertension   . Hypothyroidism   . ICD (implantable cardiac defibrillator) in place    pt has pacer/icd  . ICD (implantable cardiac defibrillator), biventricular, in situ   . LBBB (left bundle branch block)   . Memory loss   . Nonischemic cardiomyopathy ([place])   . Normal coronary arteries    s/p cardiac cath 2007  . Pacemaker    Guidant Device  . Syncope   . Systolic CHF ([place])   . Vertigo   . Wears glasses     Past Surgical History:  Procedure Laterality Date  . BACK SURGERY     lumbar fusion   . BREAST SURGERY  2000   LUMP REMOVAL. STAGE 1 CANCER  . CARDIAC CATHETERIZATION    . CATARACT EXTRACTION    . EYE SURGERY    . IMPLANTABLE CARDIOVERTER DEFIBRILLATOR GENERATOR CHANGE N/A 12/18/2012   Procedure: IMPLANTABLE CARDIOVERTER DEFIBRILLATOR  GENERATOR CHANGE;  Surgeon: [name], MD;  Location: [facility] CATH LAB;  Service: Cardiovascular;  Laterality: N/A;  . JOINT REPLACEMENT  06/14/01   right  . LUMBAR FUSION  2006  . MASS EXCISION  11/08/2011   Procedure: EXCISION MASS;  Surgeon: [name], MD;  Location: [facility] ORS;  Service: General;  Laterality: Left;  Excision Left Thigh Mass  . MASTECTOMY, PARTIAL  2008   GOT PACEMAKER AND DEFIB AT THAT TIME  . PACEMAKER INSERTION  04/23/06  . TOTAL KNEE ARTHROPLASTY  05/17/01   RIGHT KNEE  . TOTAL KNEE ARTHROPLASTY Left 11/29/2014   Procedure: TOTAL LEFT KNEE ARTHROPLASTY;  Surgeon: Paralee Cancel, MD;  Location: [facility] ORS;  Service: Orthopedics;  Laterality: Left;    There were no vitals filed for this visit.      Subjective Assessment - 08/20/16 1111    Subjective (P)  Reports extension felt good on her back.    Pertinent History (P)  lumbar fusion, Lt TKR, Lt breast Ca, OA, CHF, HOH, HTN, ICD, decr memory, vertigo   Limitations (P)  Reading   Pain Onset (P)  More than a month ago  [facility] Adult PT Treatment/Exercise - 08/20/16 0001      Ambulation/Gait   Ambulation/Gait Assistance 5: Supervision   Ambulation/Gait Assistance Details with dual tasks (toss catch bean bag, scarf; counting backwards and forwards by 3, naming food items); pt with poor ability to dual task while walking with losses of balance and significant decr in gait velocity   Ambulation Distance (Feet) 620 Feet   Assistive device None   Gait Pattern Step-through pattern;Decreased arm swing - left;Decreased arm swing - right;Decreased stride length;Decreased trunk rotation;Narrow base of support   Ambulation Surface Level;Indoor     Knee/Hip Exercises: Aerobic   Nustep L3, 4.5 min             Balance Exercises - 08/20/16 2030      Balance Exercises: Standing   SLS with Vectors Foam/compliant surface  on blue mat; double tap cones;    Rockerboard Anterior/posterior;10  reps  stagger stance tip ant, tip post, then level   Sidestepping Foam/compliant support;Theraband  at counter, 8 lengths     OTAGO PROGRAM   Backwards Walking No support   Heel Walking No support  4 lengths at counter; no UE support   Toe Walk No support  3 lengths at counter             PT Short Term Goals - 08/01/16 1212      PT SHORT TERM GOAL #1   Title Perform Berg Balance Assessment and set target for LTG. (Target date 08/03/16)  Met 4/25  47/56   Time 1   Period Weeks   Status Achieved           PT Long Term Goals - 08/08/16 1337      PT LONG TERM GOAL #1   Title Patient will verbalize knowledge of fall prevention measures that she could utilize to reduce the risk of falls in her home.   MET 08/08/16 Patient reported education helpful and she removed all scatter rugs.   Time 4   Period Weeks   Status Achieved     PT LONG TERM GOAL #2   Title Patient will improve Berg score to 50/56 to reflect a decreased risk of falls and improved balance. (Updated 08/01/16)   Time 4   Period Weeks   Status On-going     PT LONG TERM GOAL #3   Title Patient will improve FGA score to >= 25/30 to reflect a decreased risk of falls and improved balance.    Time 4   Period Weeks   Status On-going     PT LONG TERM GOAL #4   Title Patient will be Independent with HEP to address balance and strength deficits.   Time 4   Period Weeks   Status On-going     PT LONG TERM GOAL #5   Title Patient will improve her ABC score to >=24% to reflect an improved sense of balance while completing functional tasks.   Time 4   Period Weeks   Status On-going               Plan - 08/20/16 2040    Clinical Impression Statement Session focused on dual tasks while walking with patient demonstrating poor ability to concentrate mentally on a task and maintain her balance. Balance training also a focus. Patient denied questions at end of session   Rehab Potential Good   Clinical  Impairments Affecting Rehab Potential decr memory    PT Frequency 2x / week   PT  Duration 4 weeks   PT Treatment/Interventions ADLs/Self Care Home Management;DME Instruction;Gait training;Stair training;Functional mobility training;Therapeutic activities;Therapeutic exercise;Balance training;Neuromuscular re-education;Cognitive remediation;Patient/family education;Orthotic Fit/Training   PT Next Visit Plan check LTGs and discharge   PT Home Exercise Plan corner balance ex's on pillows, EC x 30 sec, EC head turns; pelvic tilts, knee to chest, trunk rotation, heel and toe raises; toe walking   Consulted and Agree with Plan of Care Patient      Patient will benefit from skilled therapeutic intervention in order to improve the following deficits and impairments:  Abnormal gait, Decreased balance, Decreased cognition, Decreased knowledge of use of DME, Decreased mobility, Decreased strength, Difficulty walking, Impaired sensation  Visit Diagnosis: Muscle weakness (generalized)  Other abnormalities of gait and mobility     Problem List Patient Active Problem List   Diagnosis Date Noted  . Gait abnormality 07/16/2016  . Chronic low back pain 07/16/2016  . Mild cognitive impairment 07/16/2016  . Wrist pain 05/10/2015  . S/P left TKA 11/29/2014  . S/P knee replacement 11/29/2014  . Spontaneous bruising 08/27/2014  . Numbness and tingling in right hand 07/28/2014  . Essential tremor 07/28/2014  . Dizziness 05/28/2014  . Shaky 05/28/2014  . Memory loss 05/28/2014  . Depression 05/06/2014  . Burn of foot, left, second degree 12/11/2011  . Lipoma of left upper thigh 3x5 cm 09/28/2011  . Other primary cardiomyopathies 08/09/2011  . Syncope 08/09/2011  . History of breast cancer T1bNxMx, s/p BCT 2008, triple negative 01/26/2011  . Chronic L breast pain with chronic recurrent seroma, s/p excisional biopsy 01/12/2010 01/26/2011  . Fainted   . ICD (implantable cardioverter-defibrillator),  biventricular, in situ 08/02/2010  . Chronic systolic heart failure ([place]) 08/02/2010  . Benign hypertension 08/02/2010    Rexanne Mano, PT 08/20/2016, 8:43 PM  [facility] [address] [place], [place], [address] Phone: [phone]   Fax:  [phone]  Name: KHADEJA ABT MRN: [id number] Date of Birth: 09/06/1933

## 2016-08-21 NOTE — Telephone Encounter (Signed)
Called to inform pharmacy that it is ok to use new generic manufacture per Sherrie Mustache, NP

## 2016-08-22 ENCOUNTER — Ambulatory Visit: Payer: Medicare Other | Admitting: Physical Therapy

## 2016-08-23 ENCOUNTER — Encounter: Payer: Self-pay | Admitting: Cardiology

## 2016-08-24 ENCOUNTER — Encounter: Payer: Self-pay | Admitting: Physical Therapy

## 2016-08-24 ENCOUNTER — Ambulatory Visit: Payer: Medicare Other | Admitting: Physical Therapy

## 2016-08-24 DIAGNOSIS — M6281 Muscle weakness (generalized): Secondary | ICD-10-CM | POA: Diagnosis not present

## 2016-08-24 DIAGNOSIS — R2689 Other abnormalities of gait and mobility: Secondary | ICD-10-CM

## 2016-08-24 NOTE — Therapy (Signed)
[facility] [address] [place], [place], [address] Phone: [phone]   Fax:  [phone]  Physical Therapy Evaluation and Discharge Summary  Patient Details  Name: [name] MRN: [id number] Date of Birth: Jan 11, 1934 Referring Provider: Dr. Krista Blue  Encounter Date: 08/24/2016      PT End of Session - 08/24/16 1045    Visit Number 9   Number of Visits 9   Date for PT Re-Evaluation 08/24/16   Authorization Time Period 07/26/16 to 09/24/16   PT Start Time 1017   PT Stop Time 1100   PT Time Calculation (min) 43 min   Activity Tolerance Patient tolerated treatment well  monitored back pain throughout   Behavior During Therapy [facility] for tasks assessed/performed      Past Medical History:  Diagnosis Date  . Arthritis   . Cancer [facility])    left breast cancer   . CHF (congestive heart failure) ([place])    PACEMAKER & DEFIB  . Complication of anesthesia    hypotensive after back surgery in 2006- reports on chart   . Depression   . Dyslipidemia   . Fainted 04/21/06   AT [place]  . GERD (gastroesophageal reflux disease)   . Headache(784.0)   . Hearing loss   . HLD (hyperlipidemia)   . Hypertension   . Hypothyroidism   . ICD (implantable cardiac defibrillator) in place    pt has pacer/icd  . ICD (implantable cardiac defibrillator), biventricular, in situ   . LBBB (left bundle branch block)   . Memory loss   . Nonischemic cardiomyopathy ([place])   . Normal coronary arteries    s/p cardiac cath 2007  . Pacemaker    Guidant Device  . Syncope   . Systolic CHF ([place])   . Vertigo   . Wears glasses     Past Surgical History:  Procedure Laterality Date  . BACK SURGERY     lumbar fusion   . BREAST SURGERY  2000   LUMP REMOVAL. STAGE 1 CANCER  . CARDIAC CATHETERIZATION    . CATARACT EXTRACTION    . EYE SURGERY    . IMPLANTABLE CARDIOVERTER DEFIBRILLATOR GENERATOR CHANGE N/A 12/18/2012   Procedure: IMPLANTABLE  CARDIOVERTER DEFIBRILLATOR GENERATOR CHANGE;  Surgeon: [name], MD;  Location[facility] CATH LAB;  Service: Cardiovascular;  Laterality: N/A;  . JOINT REPLACEMENT  06/14/01   right  . LUMBAR FUSION  2006  . MASS EXCISION  11/08/2011   Procedure: EXCISION MASS;  Surgeon: [name], MD;  [facility];  Service: General;  Laterality: Left;  Excision Left Thigh Mass  . MASTECTOMY, PARTIAL  2008   GOT PACEMAKER AND DEFIB AT THAT TIME  . PACEMAKER INSERTION  04/23/06  . TOTAL KNEE ARTHROPLASTY  05/17/01   RIGHT KNEE  . TOTAL KNEE ARTHROPLASTY Left 11/29/2014   Procedure: TOTAL LEFT KNEE ARTHROPLASTY;  Surgeon: Paralee Cancel, MD;  Location: [facility]S;  Service: Orthopedics;  Laterality: Left;    There were no vitals filed for this visit.           [facility] PT Assessment - 08/24/16 0001      Standardized Balance Assessment   Standardized Balance Assessment Berg Balance Test     Berg Balance Test   Sit to Stand Able to stand without using hands and stabilize independently   Standing Unsupported Able to stand safely 2 minutes   Sitting with Back Unsupported but Feet Supported on Floor or Stool Able to sit safely  and securely 2 minutes   Stand to Sit Sits safely with minimal use of hands   Transfers Able to transfer safely, minor use of hands   Standing Unsupported with Eyes Closed Able to stand 10 seconds safely   Standing Ubsupported with Feet Together Able to place feet together independently and stand 1 minute safely   From Standing, Reach Forward with Outstretched Arm Can reach forward >12 cm safely (5")   From Standing Position, Pick up Object from Floor Able to pick up shoe safely and easily   From Standing Position, Turn to Look Behind Over each Shoulder Turn sideways only but maintains balance   Turn 360 Degrees Able to turn 360 degrees safely but slowly   Standing Unsupported, Alternately Place Feet on Step/Stool Able to stand independently and safely and complete 8 steps in 20  seconds   Standing Unsupported, One Foot in Front Able to place foot tandem independently and hold 30 seconds   Standing on One Leg Able to lift leg independently and hold > 10 seconds   Total Score 51     Functional Gait  Assessment   Gait assessed  Yes   Gait Level Surface Walks 20 ft in less than 7 sec but greater than 5.5 sec, uses assistive device, slower speed, mild gait deviations, or deviates 6-10 in outside of the 12 in walkway width.  6.81   Change in Gait Speed Able to smoothly change walking speed without loss of balance or gait deviation. Deviate no more than 6 in outside of the 12 in walkway width.   Gait with Horizontal Head Turns Performs head turns smoothly with slight change in gait velocity (eg, minor disruption to smooth gait path), deviates 6-10 in outside 12 in walkway width, or uses an assistive device.   Gait with Vertical Head Turns Performs head turns with no change in gait. Deviates no more than 6 in outside 12 in walkway width.   Gait and Pivot Turn Pivot turns safely within 3 sec and stops quickly with no loss of balance.   Step Over Obstacle Is able to step over 2 stacked shoe boxes taped together (9 in total height) without changing gait speed. No evidence of imbalance.   Gait with Narrow Base of Support Ambulates 7-9 steps.   Gait with Eyes Closed Walks 20 ft, uses assistive device, slower speed, mild gait deviations, deviates 6-10 in outside 12 in walkway width. Ambulates 20 ft in less than 9 sec but greater than 7 sec.   Ambulating Backwards Walks 20 ft, uses assistive device, slower speed, mild gait deviations, deviates 6-10 in outside 12 in walkway width.   Steps Alternating feet, must use rail.   Total Score 24                   [facility] Adult PT Treatment/Exercise - 08/24/16 0001      Ambulation/Gait   Ambulation/Gait Assistance 7: Independent   Ambulation/Gait Assistance Details --  without additional challenges, throughout clinic   Assistive  device None   Ambulation Surface Level;Indoor                  PT Short Term Goals - 08/01/16 1212      PT SHORT TERM GOAL #1   Title Perform Berg Balance Assessment and set target for LTG. (Target date 08/03/16)  Met 4/25  47/56   Time 1   Period Weeks   Status Achieved  PT Long Term Goals - 09-04-16 1043      PT LONG TERM GOAL #1   Title Patient will verbalize knowledge of fall prevention measures that she could utilize to reduce the risk of falls in her home.   MET 08/08/16 Patient reported education helpful and she removed all scatter rugs.   Time 4   Period Weeks   Status Achieved     PT LONG TERM GOAL #2   Title Patient will improve Berg score to 50/56 to reflect a decreased risk of falls and improved balance. (Updated 08/01/16)   Baseline 09-05-22 51/56   Time 4   Period Weeks   Status Achieved     PT LONG TERM GOAL #3   Title Patient will improve FGA score to >= 25/30 to reflect a decreased risk of falls and improved balance.    Baseline 2022/09/05  24/30 (compared to 21 baseline)   Time 4   Period Weeks   Status Partially Met     PT LONG TERM GOAL #4   Title Patient will be Independent with HEP to address balance and strength deficits.   Time 4   Period Weeks   Status Achieved     PT LONG TERM GOAL #5   Title Patient will improve her ABC score to >=24% to reflect an improved sense of balance while completing functional tasks.   Baseline 09-05-2022 53.1%   Time 4   Period Weeks   Status Achieved               Plan - 2016-09-04 1050    Clinical Impression Statement Patient has completed 9 total sessions of PT (including evaluation) and has met 4 of 5 LTGs, and partially met the 5th goal. Her Berg improved from 47 to 51 and her FGA improved from 21 to 24 (<22 indicates higher fall risk). Patient's progress was somewhat limited by her back and Rt leg pain which impacted her ability to tolerate some balance and LE strengthening activities. Overall, pt  is pleased with her improvements reLated to her balance and walking. Patient is discharged from PT.    Rehab Potential Good   Clinical Impairments Affecting Rehab Potential decr memory    PT Frequency 2x / week   PT Duration 4 weeks   PT Treatment/Interventions ADLs/Self Care Home Management;DME Instruction;Gait training;Stair training;Functional mobility training;Therapeutic activities;Therapeutic exercise;Balance training;Neuromuscular re-education;Cognitive remediation;Patient/family education;Orthotic Fit/Training   PT Next Visit Plan check LTGs and discharge   PT Home Exercise Plan corner balance ex's on pillows, EC x 30 sec, EC head turns; pelvic tilts, knee to chest, trunk rotation, heel and toe raises; toe walking   Consulted and Agree with Plan of Care Patient      Patient will benefit from skilled therapeutic intervention in order to improve the following deficits and impairments:  Abnormal gait, Decreased balance, Decreased cognition, Decreased knowledge of use of DME, Decreased mobility, Decreased strength, Difficulty walking, Impaired sensation  Visit Diagnosis: Muscle weakness (generalized)  Other abnormalities of gait and mobility      G-Codes - 09-04-16 1959    Functional Assessment Tool Used (Outpatient Only) FGA 24/30; Berg 51/56   Functional Limitation Mobility: Walking and moving around   Mobility: Walking and Moving Around Goal Status [phone]) At least 1 percent but less than 20 percent impaired, limited or restricted   Mobility: Walking and Moving Around Discharge Status [id number]) At least 1 percent but less than 20 percent impaired, limited or restricted  Problem List Patient Active Problem List   Diagnosis Date Noted  . Gait abnormality 07/16/2016  . Chronic low back pain 07/16/2016  . Mild cognitive impairment 07/16/2016  . Wrist pain 05/10/2015  . S/P left TKA 11/29/2014  . S/P knee replacement 11/29/2014  . Spontaneous bruising 08/27/2014  .  Numbness and tingling in right hand 07/28/2014  . Essential tremor 07/28/2014  . Dizziness 05/28/2014  . Shaky 05/28/2014  . Memory loss 05/28/2014  . Depression 05/06/2014  . Burn of foot, left, second degree 12/11/2011  . Lipoma of left upper thigh 3x5 cm 09/28/2011  . Other primary cardiomyopathies 08/09/2011  . Syncope 08/09/2011  . History of breast cancer T1bNxMx, s/p BCT 2008, triple negative 01/26/2011  . Chronic L breast pain with chronic recurrent seroma, s/p excisional biopsy 01/12/2010 01/26/2011  . Fainted   . ICD (implantable cardioverter-defibrillator), biventricular, in situ 08/02/2010  . Chronic systolic heart failure ([place]) 08/02/2010  . Benign hypertension 08/02/2010   PHYSICAL THERAPY DISCHARGE SUMMARY  Visits from Start of Care: 9  Current functional level related to goals / functional outcomes: 4 of 5 goals met; 1 partially met; ambulating modified independent with incr gait velocity   Remaining deficits: Low back and rt thigh pain   Education / Equipment: HEP, theraband  Plan: Patient agrees to discharge.  Patient goals were partially met. Patient is being discharged due to being pleased with the current functional level.  ?????       Rexanne Mano, PT 08/24/2016, 8:01 PM  [facility] [address] [place], [place], [address] Phone: [phone]   Fax:  [phone]  Name: [name] MRN: [id number] Date of Birth: June 08, 1933

## 2016-08-31 ENCOUNTER — Encounter: Payer: Self-pay | Admitting: Internal Medicine

## 2016-08-31 ENCOUNTER — Ambulatory Visit (INDEPENDENT_AMBULATORY_CARE_PROVIDER_SITE_OTHER): Payer: Medicare Other | Admitting: Internal Medicine

## 2016-08-31 VITALS — BP 98/70 | HR 61 | Temp 98.1°F | Ht 59.0 in | Wt 121.2 lb

## 2016-08-31 DIAGNOSIS — F3341 Major depressive disorder, recurrent, in partial remission: Secondary | ICD-10-CM

## 2016-08-31 DIAGNOSIS — I5022 Chronic systolic (congestive) heart failure: Secondary | ICD-10-CM

## 2016-08-31 DIAGNOSIS — E034 Atrophy of thyroid (acquired): Secondary | ICD-10-CM

## 2016-08-31 DIAGNOSIS — I10 Essential (primary) hypertension: Secondary | ICD-10-CM | POA: Diagnosis not present

## 2016-08-31 DIAGNOSIS — R634 Abnormal weight loss: Secondary | ICD-10-CM | POA: Diagnosis not present

## 2016-08-31 DIAGNOSIS — Z7189 Other specified counseling: Secondary | ICD-10-CM | POA: Diagnosis not present

## 2016-08-31 DIAGNOSIS — G3184 Mild cognitive impairment, so stated: Secondary | ICD-10-CM

## 2016-08-31 DIAGNOSIS — N9089 Other specified noninflammatory disorders of vulva and perineum: Secondary | ICD-10-CM | POA: Diagnosis not present

## 2016-08-31 DIAGNOSIS — G250 Essential tremor: Secondary | ICD-10-CM | POA: Diagnosis not present

## 2016-08-31 DIAGNOSIS — Z79899 Other long term (current) drug therapy: Secondary | ICD-10-CM

## 2016-08-31 LAB — CBC WITH DIFFERENTIAL/PLATELET
Basophils Absolute: 0 cells/uL (ref 0–200)
Basophils Relative: 0 %
Eosinophils Absolute: 276 cells/uL (ref 15–500)
Eosinophils Relative: 3 %
HCT: 38.7 % (ref 35.0–45.0)
Hemoglobin: 12.2 g/dL (ref 11.7–15.5)
Lymphocytes Relative: 20 %
Lymphs Abs: 1840 cells/uL (ref 850–3900)
MCH: 27.8 pg (ref 27.0–33.0)
MCHC: 31.5 g/dL — ABNORMAL LOW (ref 32.0–36.0)
MCV: 88.2 fL (ref 80.0–100.0)
MPV: 10.9 fL (ref 7.5–12.5)
Monocytes Absolute: 552 cells/uL (ref 200–950)
Monocytes Relative: 6 %
Neutro Abs: 6532 cells/uL (ref 1500–7800)
Neutrophils Relative %: 71 %
Platelets: 132 10*3/uL — ABNORMAL LOW (ref 140–400)
RBC: 4.39 MIL/uL (ref 3.80–5.10)
RDW: 15.5 % — ABNORMAL HIGH (ref 11.0–15.0)
WBC: 9.2 10*3/uL (ref 3.8–10.8)

## 2016-08-31 LAB — T4, FREE: Free T4: 0.9 ng/dL (ref 0.8–1.8)

## 2016-08-31 LAB — TSH: TSH: 2.31 mIU/L

## 2016-08-31 NOTE — Progress Notes (Signed)
Patient ID: [name], female   DOB: 12-12-1933, 83 y.o.   MRN: [id number]    Location:  [facility] Place of Service: OFFICE  Chief Complaint  Patient presents with  . Medical Management of Chronic Issues    4 months routine visit; wakes up with bitterness in mouth    HPI:  83 yo female seen today for f/u. She feels like her tremor is getting worse. Her penmanship is worse. No trouble holding cups/utensils. She completed PT for gait abnormality. She was referred by Neuro. She tends to "topple over" when she bends down to pick up objects .  She needs a new DNR form as she misplaced the original one.  She c/o rectal mass with BMs that she has to "push back in" - last colonoscopy in 06/2011. Rectal exam in Aug 2017 revealed no abnormalities. She reports feling "bumps" on her bottom.  Dementia - stable on '5mg'$  aricept. Followed by neuro Dr Krista Blue. MMSE 30/30  Hx Left distal radial nondisplaced fx - she fell on May 09, 2016 in parking lot. She was sent for xray and had a fx. She is followed by Dr Cay Schillings. Hand is painful occasionally. left knee pain improved.  Dr Alvan Dame follows knee pain. She is applying cool compresses at night. She takes zanaflex prn   She has good and bad days for depression. Weight down 5 lbs since Jan 2018. She sees Dr Reece Levy for psych. Currently taking sertraline, trileptal and wellbutrin xl. She has mentioned her weight loss to her mental health provider  She c/o occasional easy bruising x >1 year. She has ASA sensitivity and does not take med. She is taking some meds that will cause it. She is taking flax seed which helps reduce amt  Chronic systolic HF/AICD/LBBB/NICM - followed by cardiology. She takes lasix, lopressor, benicar, aldactone  Hyperlipidemia - diet controlled. LDL 84  Thyroid stable on levothyroxine. TSH 1.2  She is a poor historian due to memory loss. Hx obtained from chart.    Past Medical History:  Diagnosis Date  . Arthritis   . Cancer [facility])    left  breast cancer   . CHF (congestive heart failure) ([place])    PACEMAKER & DEFIB  . Complication of anesthesia    hypotensive after back surgery in 2006- reports on chart   . Depression   . Dyslipidemia   . Fainted 04/21/06   AT [place]  . GERD (gastroesophageal reflux disease)   . Headache(784.0)   . Hearing loss   . HLD (hyperlipidemia)   . Hypertension   . Hypothyroidism   . ICD (implantable cardiac defibrillator) in place    pt has pacer/icd  . ICD (implantable cardiac defibrillator), biventricular, in situ   . LBBB (left bundle branch block)   . Memory loss   . Nonischemic cardiomyopathy ([place])   . Normal coronary arteries    s/p cardiac cath 2007  . Pacemaker    Guidant Device  . Syncope   . Systolic CHF ([place])   . Vertigo   . Wears glasses     Past Surgical History:  Procedure Laterality Date  . BACK SURGERY     lumbar fusion   . BREAST SURGERY  2000   LUMP REMOVAL. STAGE 1 CANCER  . CARDIAC CATHETERIZATION    . CATARACT EXTRACTION    . EYE SURGERY    . IMPLANTABLE CARDIOVERTER DEFIBRILLATOR GENERATOR CHANGE N/A 12/18/2012   Procedure: IMPLANTABLE CARDIOVERTER DEFIBRILLATOR GENERATOR CHANGE;  Surgeon: [name],  MD;  Location: [facility] CATH LAB;  Service: Cardiovascular;  Laterality: N/A;  . JOINT REPLACEMENT  06/14/01   right  . LUMBAR FUSION  2006  . MASS EXCISION  11/08/2011   Procedure: EXCISION MASS;  Surgeon: [name], MD;  Location: [facility];  Service: General;  Laterality: Left;  Excision Left Thigh Mass  . MASTECTOMY, PARTIAL  2008   GOT PACEMAKER AND DEFIB AT THAT TIME  . PACEMAKER INSERTION  04/23/06  . TOTAL KNEE ARTHROPLASTY  05/17/01   RIGHT KNEE  . TOTAL KNEE ARTHROPLASTY Left 11/29/2014   Procedure: TOTAL LEFT KNEE ARTHROPLASTY;  Surgeon: Paralee Cancel, MD;  Location: [facility];  Service: Orthopedics;  Laterality: Left;    Patient Care Team: [name], DO as PCP - General (Internal Medicine) [name], MD as Consulting Physician (General  Surgery) Paralee Cancel, MD as Consulting Physician (Orthopedic Surgery) Marica Otter, [place] (Optometry) Marcial Pacas, MD as Consulting Physician (Neurology) [name], MD as Consulting Physician (Cardiology)  Social History   Social History  . Marital status: Married    Spouse name: N/A  . Number of children: 1  . Years of education: Masters   Occupational History  . Retired    Social History Main Topics  . Smoking status: Never Smoker  . Smokeless tobacco: Never Used  . Alcohol use No  . Drug use: No  . Sexual activity: Not Currently    Birth control/ protection: Post-menopausal   Other Topics Concern  . Not on file   Social History Narrative   Lives at home with husband.   Right-handed.      As of 07/28/2014   Diet: No special diet   Caffeine: yes, Chocolate, tea and sodas    Married: YES, 1970   House: Yes, 2 stories, 2-3 persons live in home   Pets: No   Current/Past profession: Engineer, mining, Designer, jewellery    Exercise: Yes 2-3 x weekly   Living Will: Yes   DNR: No   POA/HPOA: No           reports that she has never smoked. She has never used smokeless tobacco. She reports that she does not drink alcohol or use drugs.  Family History  Problem Relation Age of Onset  . Hypertension Mother   . Arthritis Mother   . Hypertension Father   . Hypertension Brother   . Hypertension Brother    Family Status  Relation Status  . Mother Deceased  . Father Deceased  . Brother Alive  . Brother Alive  . Daughter Alive     Allergies  Allergen Reactions  . Iodine Shortness Of Breath    Iodine contrast, CHF , SOB  . Shellfish Allergy Shortness Of Breath  . Aspirin Nausea And Vomiting  . Codeine Nausea And Vomiting    Medications: Patient's Medications  New Prescriptions   No medications on file  Previous Medications   ACETAMINOPHEN (TYLENOL) 325 MG TABLET    Take 2 tablets (650 mg total) by mouth every 6 (six) hours as needed for mild pain (or  Fever >/= 101).   BUPROPION (WELLBUTRIN XL) 150 MG 24 HR TABLET    Take 3 tablets (450 mg total) by mouth daily.   CHOLECALCIFEROL (VITAMIN D) 1000 UNITS TABLET    Take 1,000 Units by mouth daily.   DONEPEZIL (ARICEPT) 10 MG TABLET    Take 1 tablet (10 mg total) by mouth at bedtime.   FUROSEMIDE (LASIX) 40 MG TABLET  Take one (1) tablet (40 mg total) by mouth every morning and and half (1/2) tablet (20 mg total) by mouth every evening.   LEVOTHYROXINE (SYNTHROID, LEVOTHROID) 100 MCG TABLET    Take 1 tablet (100 mcg total) by mouth daily before breakfast.   MAGNESIUM 400 MG CAPS    Take 400 mg by mouth daily.   METOPROLOL TARTRATE (LOPRESSOR) 25 MG TABLET    TAKE 1 TABLET BY MOUTH  EVERY MORNING AND TAKE  ONE-HALF TABLET BY MOUTH  EVERY EVENING   OLMESARTAN (BENICAR) 40 MG TABLET    TAKE 1 TABLET BY MOUTH  EVERY MORNING FOR BLOOD  PRESSURE   OMEGA-3 FATTY ACIDS (FISH OIL) 1200 MG CAPS    Take 1,200 mg by mouth every morning.   OMEPRAZOLE (PRILOSEC) 40 MG CAPSULE    TAKE 1 CAPSULE BY MOUTH  DAILY FOR STOMACH   OXCARBAZEPINE (TRILEPTAL) 300 MG TABLET    Take 300 mg by mouth every morning.    POTASSIUM CHLORIDE (K-DUR,KLOR-CON) 10 MEQ TABLET    TAKE 1 TABLET BY MOUTH  DAILY   SERTRALINE (ZOLOFT) 100 MG TABLET    Take 200 mg by mouth every morning.    SPIRONOLACTONE (ALDACTONE) 25 MG TABLET    TAKE 1 TABLET BY MOUTH  EVERY MORNING   TIZANIDINE (ZANAFLEX) 4 MG TABLET    TAKE 1 TABLET EVERY 6 HOURS AS NEEDED FOR MUSCLE SPASM.   VITAMIN E 1000 UNIT CAPSULE    Take 1,000 Units by mouth daily.  Modified Medications   No medications on file  Discontinued Medications   No medications on file    Review of Systems  Unable to perform ROS: Dementia    Vitals:   08/31/16 0937  BP: 98/70  Pulse: 61  Temp: 98.1 F (36.7 C)  SpO2: 97%  Weight: 121 lb 3.2 oz (55 kg)  Height: '4\' 11"'$  (1.499 m)   Body mass index is 24.48 kg/m.  Physical Exam  Constitutional: She is oriented to person, place, and  time. She appears well-developed.  Frail appearing in NAD  HENT:  Mouth/Throat: Oropharynx is clear and moist. No oropharyngeal exudate.  MMM; no oral thrush  Eyes: Pupils are equal, round, and reactive to light. No scleral icterus.  Neck: Neck supple. Carotid bruit is not present. No tracheal deviation present. No thyromegaly present.  Cardiovascular: Normal rate, regular rhythm and intact distal pulses.  Exam reveals no gallop and no friction rub.   Murmur (1/6 SEM) heard. No LE edema b/l. no calf TTP.   Pulmonary/Chest: Effort normal and breath sounds normal. No stridor. No respiratory distress. She has no wheezes. She has no rales.  Palpable ACW AICD  Abdominal: Soft. Normal appearance and bowel sounds are normal. She exhibits no distension and no mass. There is no hepatomegaly. There is no tenderness. There is no rigidity, no rebound and no guarding. No hernia.  Genitourinary:  Genitourinary Comments: Skin tags noted perineum area. No lesions noted  Musculoskeletal: She exhibits edema (small joints). She exhibits no tenderness.  Lymphadenopathy:    She has no cervical adenopathy.  Neurological: She is alert and oriented to person, place, and time. She displays tremor (resting).  Skin: Skin is warm and dry. No purpura and no rash noted.  Psychiatric: She has a normal mood and affect. Her behavior is normal.     Labs reviewed: Clinical Support on 08/08/2016  Component Date Value Ref Range Status  . Date Time Interrogation Session 08/08/2016 [id number]   Final  .  Pulse Generator Manufacturer 08/08/2016 BOST   Final  . Pulse Gen Model 08/08/2016 N141 ENERGEN CRT-D   Final  . Pulse Gen Serial Number 08/08/2016 [id number]   Final  . Clinic Name 08/08/2016 [facility]   Final  . Implantable Pulse Generator Type 08/08/2016 Cardiac Resynch Therapy Defibulator   Final  . Implantable Pulse Generator Implan* 08/08/2016 [id number]   Final  . Implantable Lead Manufacturer 08/08/2016 BOST    Final  . Implantable Lead Model 08/08/2016 4469 Fineline II Sterox EZ   Final  . Implantable Lead Serial Number 08/08/2016 [id number]   Final  . Implantable Lead Implant Date 08/08/2016 [id number]   Final  . Implantable Lead Location 08/08/2016 [id number]   Final  . Implantable Lead Manufacturer 08/08/2016 GUIC   Final  . Implantable Lead Model 08/08/2016 0157 Endotak Reliance   Final  . Implantable Lead Serial Number 08/08/2016 [id number]   Final  . Implantable Lead Implant Date 08/08/2016 [id number]   Final  . Implantable Lead Location 08/08/2016 [id number]   Final  . Implantable Lead Manufacturer 08/08/2016 GUIC   Final  . Implantable Lead Model 08/08/2016 4555 Acuity Steerable   Final  . Implantable Lead Serial Number 08/08/2016 [id number]   Final  . Implantable Lead Implant Date 08/08/2016 [id number]   Final  . Implantable Lead Location 08/08/2016 [id number]   Final  . Lead Channel Setting Sensing Sensi* 08/08/2016 0.6  mV Final  . Lead Channel Setting Sensing Adapt* 08/08/2016 Adaptive Sensing   Final  . Lead Channel Setting Sensing Sensi* 08/08/2016 1.0  mV Final  . Lead Channel Setting Sensing Adapt* 08/08/2016 Adaptive Sensing   Final  . Lead Channel Setting Pacing Amplit* 08/08/2016 2.0  V Final  . Lead Channel Setting Pacing Pulse * 08/08/2016 0.4  ms Final  . Lead Channel Setting Pacing Amplit* 08/08/2016 2.0  V Final  . Lead Channel Setting Pacing Pulse * 08/08/2016 0.8  ms Final  . Lead Channel Setting Pacing Amplit* 08/08/2016 2.4  V Final  . Lead Channel Impedance Value 08/08/2016 807  ohm Final  . Lead Channel Pacing Threshold Ampl* 08/08/2016 0.9  V Final  . Lead Channel Pacing Threshold Puls* 08/08/2016 0.8  ms Final  . Lead Channel Impedance Value 08/08/2016 396  ohm Final  . Lead Channel Pacing Threshold Ampl* 08/08/2016 0.7  V Final  . Lead Channel Pacing Threshold Puls* 08/08/2016 0.4  ms Final  . Lead Channel Impedance Value 08/08/2016 577  ohm Final  . Lead Channel Pacing Threshold Ampl*  08/08/2016 0.4  V Final  . Lead Channel Pacing Threshold Puls* 08/08/2016 0.4  ms Final  . HighPow Impedance 08/08/2016 50  ohm Final  . Battery Status 08/08/2016 BOS   Final  . Battery Remaining Longevity 08/08/2016 84  mo Final  . Battery Remaining Percentage 08/08/2016 100  % Final  . Brady Statistic RA Percent Paced 08/08/2016 8  % Final  . Brady Statistic RV Percent Paced 08/08/2016 100  % Final  . Eval Rhythm 08/08/2016 AsBp   Final    No results found.   Assessment/Plan   ICD-9-CM ICD-10-CM   1. Weight loss 783.21 R63.4 CMP with eGFR     CBC with Differential/Platelets   likely med induced but will r/o metabolic cause  2. Mild cognitive impairment 331.83 G31.84    much improved; MMSE 30/30 now  3. Hypothyroidism due to acquired atrophy of thyroid 244.8 E03.4 TSH   246.8  T4, Free  4. Recurrent major depressive disorder, in  partial remission ([place]) 296.35 F33.41   5. Chronic systolic heart failure (HCC) 428.22 I50.22   6. Essential tremor 333.1 G25.0   7. Benign hypertension 401.1 I10   8. High risk medication use V58.69 Z79.899 CMP with eGFR     CBC with Differential/Platelets  9. Skin tag of female perineum 624.8 N90.89    new  10. Advanced directives, counseling/discussion V65.49 Z71.89     Reassurance given for perineum skin tags. They are benign appearing  If labs nml, t/c d/cing aricept. If so, will need to contact neurology for alternatives  Continue current medications as ordered  Will call with lab results  New DNR form completed  Please bring [place] to next appointment so that we may place it into your chart  Follow up with neurology Dr Krista Blue as scheduled  Follow up  In 3 mos for thyroid, depression, weight loss   Shea Swalley S. Perlie Gold  [facility] [address] [place], [place] [address] [phone] Cell (Monday-Friday 8 AM - 5 PM) [phone] After 5 PM and follow  prompts

## 2016-08-31 NOTE — Patient Instructions (Signed)
Continue current medications as ordered  Will call with lab results  New DNR form completed  Please bring [place] to next appointment so that we may place it into your chart  Follow up with neurology Dr Krista Blue as scheduled  Follow up  In 3 mos for thyroid, depression, weight loss

## 2016-09-01 LAB — COMPLETE METABOLIC PANEL WITH GFR
ALT: 27 U/L (ref 6–29)
AST: 23 U/L (ref 10–35)
Albumin: 3.9 g/dL (ref 3.6–5.1)
Alkaline Phosphatase: 67 U/L (ref 33–130)
BUN: 31 mg/dL — ABNORMAL HIGH (ref 7–25)
CO2: 27 mmol/L (ref 20–31)
Calcium: 10.2 mg/dL (ref 8.6–10.4)
Chloride: 107 mmol/L (ref 98–110)
Creat: 1 mg/dL — ABNORMAL HIGH (ref 0.60–0.88)
GFR, Est African American: 61 mL/min (ref >=60)
GFR, Est Non African American: 53 mL/min — ABNORMAL LOW (ref >=60)
Glucose, Bld: 88 mg/dL (ref 65–99)
Potassium: 4.5 mmol/L (ref 3.5–5.3)
Sodium: 141 mmol/L (ref 135–146)
Total Bilirubin: 0.3 mg/dL (ref 0.2–1.2)
Total Protein: 6.7 g/dL (ref 6.1–8.1)

## 2016-09-28 ENCOUNTER — Other Ambulatory Visit: Payer: Self-pay | Admitting: *Deleted

## 2016-09-28 MED ORDER — FUROSEMIDE 40 MG PO TABS: ORAL_TABLET | ORAL | 3 refills | Status: DC

## 2016-09-28 NOTE — Telephone Encounter (Signed)
[facility] 

## 2016-10-02 ENCOUNTER — Other Ambulatory Visit: Payer: Self-pay

## 2016-10-02 MED ORDER — FUROSEMIDE 40 MG PO TABS: ORAL_TABLET | ORAL | 3 refills | Status: DC

## 2016-10-02 NOTE — Telephone Encounter (Signed)
[facility]

## 2016-10-06 ENCOUNTER — Other Ambulatory Visit: Payer: Self-pay | Admitting: Internal Medicine

## 2016-10-15 ENCOUNTER — Other Ambulatory Visit: Payer: Self-pay | Admitting: Internal Medicine

## 2016-11-07 ENCOUNTER — Ambulatory Visit (INDEPENDENT_AMBULATORY_CARE_PROVIDER_SITE_OTHER): Payer: Medicare Other | Admitting: *Deleted

## 2016-11-07 DIAGNOSIS — I428 Other cardiomyopathies: Secondary | ICD-10-CM | POA: Diagnosis not present

## 2016-11-07 NOTE — Progress Notes (Signed)
Remote ICD transmission.   

## 2016-11-08 ENCOUNTER — Encounter: Payer: Self-pay | Admitting: Cardiology

## 2016-11-09 ENCOUNTER — Telehealth: Payer: Self-pay | Admitting: *Deleted

## 2016-11-09 LAB — CUP PACEART REMOTE DEVICE CHECK
Battery Remaining Longevity: 84 mo
Battery Remaining Percentage: 100 %
Brady Statistic RA Percent Paced: 7 %
Brady Statistic RV Percent Paced: 100 %
Date Time Interrogation Session: 20180801142300
HighPow Impedance: 50 Ohm
Implantable Lead Implant Date: 20080116
Implantable Lead Implant Date: 20080116
Implantable Lead Implant Date: 20080116
Implantable Lead Location: 753859
Implantable Lead Location: 753860
Implantable Lead Location: 753860
Implantable Lead Model: 157
Implantable Lead Model: 4469
Implantable Lead Model: 4555
Implantable Lead Serial Number: 136532
Implantable Lead Serial Number: 161542
Implantable Lead Serial Number: 473495
Implantable Pulse Generator Implant Date: 20140911
Lead Channel Impedance Value: 397 Ohm
Lead Channel Impedance Value: 589 Ohm
Lead Channel Impedance Value: 833 Ohm
Lead Channel Pacing Threshold Amplitude: 0.4 V
Lead Channel Pacing Threshold Amplitude: 0.7 V
Lead Channel Pacing Threshold Amplitude: 0.9 V
Lead Channel Pacing Threshold Pulse Width: 0.4 ms
Lead Channel Pacing Threshold Pulse Width: 0.4 ms
Lead Channel Pacing Threshold Pulse Width: 0.8 ms
Lead Channel Setting Pacing Amplitude: 2 V
Lead Channel Setting Pacing Amplitude: 2 V
Lead Channel Setting Pacing Amplitude: 2.4 V
Lead Channel Setting Pacing Pulse Width: 0.4 ms
Lead Channel Setting Pacing Pulse Width: 0.8 ms
Lead Channel Setting Sensing Sensitivity: 0.6 mV
Lead Channel Setting Sensing Sensitivity: 1 mV
Pulse Gen Serial Number: 111765

## 2016-11-09 NOTE — Telephone Encounter (Signed)
Recommend she tries plain mucinex OTC and plain OTC zyrtec/allegra/or claritin daily. If no better in next 72 hrs, she needs to be seen.

## 2016-11-09 NOTE — Telephone Encounter (Signed)
Patient c/o cold, upper respiratory symptoms   1.) How long have you had your symptoms?  About 2 days  2.) Any fever, if yes did you check with a thermometer? No fever  3.) Any myalgias (muscle aches)? No more than normal  4.) Are you coughing up mucus, if yes is it discolored? Coughing up yellow mucus with Wheezing  5.) What have you tried for symptoms? Delsym with no relief  6.) Have you been around anyone sick? Husband had a bad cold.   Patient wanted to be seen but no available appointment. Please Advise.    I will forward your message to your provider and call with response. If your symptoms progress please seek medical attention at Urgent Care

## 2016-11-09 NOTE — Telephone Encounter (Signed)
Patient notified and agreed.  

## 2016-12-03 ENCOUNTER — Encounter: Payer: Self-pay | Admitting: Cardiology

## 2016-12-07 ENCOUNTER — Encounter: Payer: Self-pay | Admitting: Internal Medicine

## 2016-12-07 ENCOUNTER — Ambulatory Visit (INDEPENDENT_AMBULATORY_CARE_PROVIDER_SITE_OTHER): Payer: Medicare Other | Admitting: Internal Medicine

## 2016-12-07 VITALS — BP 94/62 | HR 62 | Temp 97.6°F | Ht 60.0 in | Wt 122.8 lb

## 2016-12-07 DIAGNOSIS — G250 Essential tremor: Secondary | ICD-10-CM | POA: Diagnosis not present

## 2016-12-07 DIAGNOSIS — L659 Nonscarring hair loss, unspecified: Secondary | ICD-10-CM

## 2016-12-07 DIAGNOSIS — Z79899 Other long term (current) drug therapy: Secondary | ICD-10-CM

## 2016-12-07 DIAGNOSIS — F3341 Major depressive disorder, recurrent, in partial remission: Secondary | ICD-10-CM | POA: Diagnosis not present

## 2016-12-07 DIAGNOSIS — E034 Atrophy of thyroid (acquired): Secondary | ICD-10-CM

## 2016-12-07 DIAGNOSIS — I10 Essential (primary) hypertension: Secondary | ICD-10-CM | POA: Diagnosis not present

## 2016-12-07 DIAGNOSIS — G3184 Mild cognitive impairment, so stated: Secondary | ICD-10-CM

## 2016-12-07 DIAGNOSIS — I5022 Chronic systolic (congestive) heart failure: Secondary | ICD-10-CM

## 2016-12-07 NOTE — Progress Notes (Signed)
Patient ID: [name], female   DOB: [birth year], 83 y.o.   MRN: [id number]    Location:  [facility] Place of Service: OFFICE  Chief Complaint  Patient presents with  . Medical Management of Chronic Issues    3 month follow-up. Patient concerned about weight loss and hand tremours   . Immunizations    Patient will get flu vaccine at the pharmacy   . Medication Refill    No refills needed   . Health Maintenance    Dexa Scan ordered     HPI:  83 yo female seen today for f/u. She c/o hair falling out since end of last year. har has become very thin. She has no FHx female balding. No cold/hot intolerance, palpitations, CP or new SOB. She as a tremor that is worse with increased stress/anxiety  Skin tags on perineum - unchanged. last colonoscopy in 06/2011. Rectal exam in Aug 2017 revealed no abnormalities.   Dementia - stable. She reports  '5mg'$  aricept causes next day sedation and she does not take it on Saturday night so that she can make it to church on Sunday. Followed by neuro Dr Krista Blue. MMSE 30/30  Hx Left distal radial nondisplaced fx - she fell on May 10, 2015 in parking lot. She was sent for xray and had a fx. She is followed by Dr Cay Schillings. Hand is painful occasionally. left knee pain improved.  Dr Alvan Dame follows knee pain. She is applying cool compresses at night. She takes zanaflex prn   MDD - She has good and bad days for depression. Weight up 1 lbs since May 2018. She has hx weight loss. She sees Dr Reece Levy for psych. Currently taking sertraline, trileptal and wellbutrin xl. She has mentioned her weight loss to her mental health provider. She has never had counseling.  Chronic systolic HF/AICD&pacer/LBBB/NICM - followed by cardiology Dr Lovena Le. She takes lasix, lopressor, benicar, aldactone. EF 45-50% in 2015  (prev 20% in 2008)  Hyperlipidemia - diet controlled. She also takes flax seed oil. LDL 84  Hypothyroidism - stable on levothyroxine. TSH 2.31; T4 free 0.9  She is a poor  historian due to memory loss. Hx obtained from chart.   Past Medical History:  Diagnosis Date  . Arthritis   . Cancer [facility])    left breast cancer   . CHF (congestive heart failure) ([place])    PACEMAKER & DEFIB  . Complication of anesthesia    hypotensive after back surgery in 2006- reports on chart   . Depression   . Dyslipidemia   . Fainted 04/21/06   AT [place]  . GERD (gastroesophageal reflux disease)   . Headache(784.0)   . Hearing loss   . HLD (hyperlipidemia)   . Hypertension   . Hypothyroidism   . ICD (implantable cardiac defibrillator) in place    pt has pacer/icd  . ICD (implantable cardiac defibrillator), biventricular, in situ   . LBBB (left bundle branch block)   . Memory loss   . Nonischemic cardiomyopathy ([place])   . Normal coronary arteries    s/p cardiac cath 2007  . Pacemaker    Guidant Device  . Syncope   . Systolic CHF ([place])   . Vertigo   . Wears glasses     Past Surgical History:  Procedure Laterality Date  . BACK SURGERY     lumbar fusion   . BREAST SURGERY  2000   LUMP REMOVAL. STAGE 1 CANCER  . CARDIAC CATHETERIZATION    . CATARACT  EXTRACTION    . EYE SURGERY    . IMPLANTABLE CARDIOVERTER DEFIBRILLATOR GENERATOR CHANGE N/A 12/18/2012   Procedure: IMPLANTABLE CARDIOVERTER DEFIBRILLATOR GENERATOR CHANGE;  Surgeon: Marinus Maw, MD;  Location: [facility] CATH LAB;  Service: Cardiovascular;  Laterality: N/A;  . JOINT REPLACEMENT  06/14/01   right  . LUMBAR FUSION  2006  . MASS EXCISION  11/08/2011   Procedure: EXCISION MASS;  Surgeon: Almond Lint, MD;  Location: [facility] ORS;  Service: General;  Laterality: Left;  Excision Left Thigh Mass  . MASTECTOMY, PARTIAL  2008   GOT PACEMAKER AND DEFIB AT THAT TIME  . PACEMAKER INSERTION  04/23/06  . TOTAL KNEE ARTHROPLASTY  05/17/01   RIGHT KNEE  . TOTAL KNEE ARTHROPLASTY Left 11/29/2014   Procedure: TOTAL LEFT KNEE ARTHROPLASTY;  Surgeon: Durene Romans, MD;  Location: [facility] ORS;  Service: Orthopedics;  Laterality: Left;     Patient Care Team: Kirt Boys, DO as PCP - General (Internal Medicine) Almond Lint, MD as Consulting Physician (General Surgery) Durene Romans, MD as Consulting Physician (Orthopedic Surgery) Blima Ledger, OD (Optometry) Levert Feinstein, MD as Consulting Physician (Neurology) Marinus Maw, MD as Consulting Physician (Cardiology)  Social History   Social History  . Marital status: Married    Spouse name: N/A  . Number of children: 1  . Years of education: Masters   Occupational History  . Retired    Social History Main Topics  . Smoking status: Never Smoker  . Smokeless tobacco: Never Used  . Alcohol use No  . Drug use: No  . Sexual activity: Not Currently    Birth control/ protection: Post-menopausal   Other Topics Concern  . Not on file   Social History Narrative   Lives at home with husband.   Right-handed.      As of 07/28/2014   Diet: No special diet   Caffeine: yes, Chocolate, tea and sodas    Married: YES, 1970   House: Yes, 2 stories, 2-3 persons live in home   Pets: No   Current/Past profession: Nurse, mental health, Publishing rights manager    Exercise: Yes 2-3 x weekly   Living Will: Yes   DNR: No   POA/HPOA: No           reports that she has never smoked. She has never used smokeless tobacco. She reports that she does not drink alcohol or use drugs.  Family History  Problem Relation Age of Onset  . Hypertension Mother   . Arthritis Mother   . Hypertension Father   . Hypertension Brother   . Hypertension Brother    Family Status  Relation Status  . Mother Deceased  . Father Deceased  . Brother Alive  . Brother Alive  . Daughter Alive     Allergies  Allergen Reactions  . Iodine Shortness Of Breath    Iodine contrast, CHF , SOB  . Shellfish Allergy Shortness Of Breath  . Aspirin Nausea And Vomiting  . Codeine Nausea And Vomiting    Medications: Patient's Medications  New Prescriptions   No medications on file  Previous Medications    ACETAMINOPHEN (TYLENOL) 325 MG TABLET    Take 2 tablets (650 mg total) by mouth every 6 (six) hours as needed for mild pain (or Fever >/= 101).   BUPROPION (WELLBUTRIN XL) 150 MG 24 HR TABLET    Take 3 tablets (450 mg total) by mouth daily.   CHOLECALCIFEROL (VITAMIN D) 1000 UNITS TABLET    Take 1,000 Units by mouth  daily.   DONEPEZIL (ARICEPT) 10 MG TABLET    Take 1 tablet (10 mg total) by mouth at bedtime.   FUROSEMIDE (LASIX) 40 MG TABLET    Take one (1) tablet (40 mg total) by mouth every morning and and half (1/2) tablet (20 mg total) by mouth every evening.   LEVOTHYROXINE (SYNTHROID, LEVOTHROID) 100 MCG TABLET    Take 1 tablet (100 mcg total) by mouth daily before breakfast.   MAGNESIUM 400 MG CAPS    Take 400 mg by mouth daily.   METOPROLOL TARTRATE (LOPRESSOR) 25 MG TABLET    TAKE 1 TABLET BY MOUTH  EVERY MORNING AND TAKE  ONE-HALF TABLET BY MOUTH  EVERY EVENING   OLMESARTAN (BENICAR) 40 MG TABLET    TAKE 1 TABLET BY MOUTH  EVERY MORNING FOR BLOOD  PRESSURE   OMEGA-3 FATTY ACIDS (FISH OIL) 1200 MG CAPS    Take 1,200 mg by mouth every morning.   OMEPRAZOLE (PRILOSEC) 40 MG CAPSULE    TAKE 1 CAPSULE BY MOUTH  DAILY FOR STOMACH   OXCARBAZEPINE (TRILEPTAL) 300 MG TABLET    Take 300 mg by mouth every morning.    POTASSIUM CHLORIDE (K-DUR,KLOR-CON) 10 MEQ TABLET    TAKE 1 TABLET BY MOUTH  DAILY   SERTRALINE (ZOLOFT) 100 MG TABLET    Take 200 mg by mouth every morning.    SPIRONOLACTONE (ALDACTONE) 25 MG TABLET    TAKE 1 TABLET BY MOUTH  EVERY MORNING   TIZANIDINE (ZANAFLEX) 4 MG TABLET    TAKE 1 TABLET EVERY 6 HOURS AS NEEDED FOR MUSCLE SPASM.   VITAMIN E 1000 UNIT CAPSULE    Take 1,000 Units by mouth daily.  Modified Medications   No medications on file  Discontinued Medications   No medications on file    Review of Systems  Unable to perform ROS: Dementia (memory loss)    Vitals:   12/07/16 1018  BP: 94/62  Pulse: 62  Temp: 97.6 F (36.4 C)  TempSrc: Oral  SpO2: 95%   Weight: 122 lb 12.8 oz (55.7 kg)  Height: 5' (1.524 m)   Body mass index is 23.98 kg/m.  Physical Exam  Constitutional: She is oriented to person, place, and time. She appears well-developed and well-nourished.  HENT:  Mouth/Throat: Oropharynx is clear and moist. No oropharyngeal exudate.  MMM; no oral thrush  Eyes: Pupils are equal, round, and reactive to light. No scleral icterus.  Neck: Neck supple. Carotid bruit is not present. No tracheal deviation present. No thyromegaly present.  Cardiovascular: Normal rate, regular rhythm, normal heart sounds and intact distal pulses.  Exam reveals no gallop and no friction rub.   No murmur heard. No LE edema b/l. no calf TTP.   Pulmonary/Chest: Effort normal and breath sounds normal. No stridor. No respiratory distress. She has no wheezes. She has no rales.  Abdominal: Soft. Normal appearance and bowel sounds are normal. She exhibits no distension and no mass. There is no hepatomegaly. There is no tenderness. There is no rigidity, no rebound and no guarding. No hernia.  Musculoskeletal: She exhibits edema.  Lymphadenopathy:    She has no cervical adenopathy.  Neurological: She is alert and oriented to person, place, and time. She has normal reflexes. She displays tremor (resting).  Skin: Skin is warm and dry. No rash noted.  Very thin hair on scalp  Psychiatric: She has a normal mood and affect. Her behavior is normal. Judgment and thought content normal.     Labs  reviewed: Clinical Support on 11/07/2016  Component Date Value Ref Range Status  . Date Time Interrogation Session 11/07/2016 [id number]   Final  . Pulse Generator Manufacturer 11/07/2016 BOST   Final  . Pulse Gen Model 11/07/2016 N141 ENERGEN CRT-D   Final  . Pulse Gen Serial Number 11/07/2016 [id number]   Final  . Clinic Name 11/07/2016 [facility]   Final  . Implantable Pulse Generator Type 11/07/2016 Cardiac Resynch Therapy Defibulator   Final  . Implantable Pulse  Generator Implan* 11/07/2016 [id number]   Final  . Implantable Lead Manufacturer 11/07/2016 BOST   Final  . Implantable Lead Model 11/07/2016 4469 Fineline II Sterox EZ   Final  . Implantable Lead Serial Number 11/07/2016 [id number]   Final  . Implantable Lead Implant Date 11/07/2016 [id number]   Final  . Implantable Lead Location 11/07/2016 [id number]   Final  . Implantable Lead Manufacturer 11/07/2016 GUIC   Final  . Implantable Lead Model 11/07/2016 0157 [employer]ak [employer]iance   Final  . Implantable Lead Serial Number 11/07/2016 [id number]   Final  . Implantable Lead Implant Date 11/07/2016 [id number]   Final  . Implantable Lead Location 11/07/2016 [id number]   Final  . Implantable Lead Manufacturer 11/07/2016 GUIC   Final  . Implantable Lead Model 11/07/2016 4555 Acuity Steerable   Final  . Implantable Lead Serial Number 11/07/2016 [id number]   Final  . Implantable Lead Implant Date 11/07/2016 [id number]   Final  . Implantable Lead Location 11/07/2016 093235   Final  . Lead Channel Setting Sensing Sensi* 11/07/2016 0.6  mV Final  . Lead Channel Setting Sensing Adapt* 11/07/2016 Adaptive Sensing   Final  . Lead Channel Setting Sensing Sensi* 11/07/2016 1.0  mV Final  . Lead Channel Setting Sensing Adapt* 11/07/2016 Adaptive Sensing   Final  . Lead Channel Setting Pacing Amplit* 11/07/2016 2.0  V Final  . Lead Channel Setting Pacing Pulse * 11/07/2016 0.4  ms Final  . Lead Channel Setting Pacing Amplit* 11/07/2016 2.0  V Final  . Lead Channel Setting Pacing Pulse * 11/07/2016 0.8  ms Final  . Lead Channel Setting Pacing Amplit* 11/07/2016 2.4  V Final  . Lead Channel Impedance Value 11/07/2016 833  ohm Final  . Lead Channel Pacing Threshold Ampl* 11/07/2016 0.9  V Final  . Lead Channel Pacing Threshold Puls* 11/07/2016 0.8  ms Final  . Lead Channel Impedance Value 11/07/2016 397  ohm Final  . Lead Channel Pacing Threshold Ampl* 11/07/2016 0.7  V Final  . Lead Channel Pacing Threshold Puls* 11/07/2016 0.4  ms Final  .  Lead Channel Impedance Value 11/07/2016 589  ohm Final  . Lead Channel Pacing Threshold Ampl* 11/07/2016 0.4  V Final  . Lead Channel Pacing Threshold Puls* 11/07/2016 0.4  ms Final  . HighPow Impedance 11/07/2016 50  ohm Final  . Battery Status 11/07/2016 BOS   Final  . Battery Remaining Longevity 11/07/2016 84  mo Final  . Battery Remaining Percentage 11/07/2016 100  % Final  . Brady Statistic RA Percent Paced 11/07/2016 7  % Final  . Brady Statistic RV Percent Paced 11/07/2016 100  % Final  . Eval Rhythm 11/07/2016 AsBp   Final    No results found.   Assessment/Plan   ICD-10-CM   1. Essential tremor - stable G25.0   2. Mild cognitive impairment - with increased sedation on 84m aricept G31.84   3. Hypothyroidism due to acquired atrophy of thyroid E03.4 TSH  4. Recurrent major depressive disorder, in partial remission (  [place]) F33.41   5. Benign hypertension I10   6. Chronic systolic heart failure (HCC) I50.22   7. High risk medication use Z79.899 CMP with eGFR  8. Alopecia L65.9    likely med induced    Don't forget to get your flu shot this fall!  Follow up with specialists as scheduled   REDUCE DONEPEZIL TO 1/2 TABLET ON Monday, Wednesday, Friday and Sunday schedule.  WILL SEND NEW SCRIPT TO PHARMACY FOR '5MG'$  DONEPEZIL TABS TO TAKE 1/2 TABLET AT BEDTIME EVERY NIGHT - use when old bottle empty  Continue other medications as ordered  KEEP A WORRY JOURNAL  Follow up in 3 mos for [place]. Perlie Gold  [facility] [address] [place], [place] [address] [phone] Cell (Monday-Friday 8 AM - 5 PM) [phone] After 5 PM and follow prompts

## 2016-12-07 NOTE — Patient Instructions (Addendum)
Don't forget to get your flu shot this fall!  Follow up with specialists as scheduled   REDUCE DONEPEZIL TO 1/2 TABLET ON Monday, Wednesday, Friday and Sunday schedule.  WILL SEND NEW SCRIPT TO PHARMACY FOR 5MG  DONEPEZIL TABS TO TAKE 1/2 TABLET AT BEDTIME EVERY NIGHT - use when old bottle empty  Continue other medications as ordered  KEEP A WORRY JOURNAL  Follow up in 3 mos for CPE/AWV

## 2016-12-08 LAB — COMPLETE METABOLIC PANEL WITH GFR
ALT: 26 U/L (ref 6–29)
AST: 23 U/L (ref 10–35)
Albumin: 4.1 g/dL (ref 3.6–5.1)
Alkaline Phosphatase: 66 U/L (ref 33–130)
BUN: 28 mg/dL — ABNORMAL HIGH (ref 7–25)
CO2: 24 mmol/L (ref 20–32)
Calcium: 9.9 mg/dL (ref 8.6–10.4)
Chloride: 107 mmol/L (ref 98–110)
Creat: 1.21 mg/dL — ABNORMAL HIGH (ref 0.60–0.88)
GFR, Est African American: 49 mL/min — ABNORMAL LOW (ref >=60)
GFR, Est Non African American: 42 mL/min — ABNORMAL LOW (ref >=60)
Glucose, Bld: 73 mg/dL (ref 65–99)
Potassium: 5 mmol/L (ref 3.5–5.3)
Sodium: 143 mmol/L (ref 135–146)
Total Bilirubin: 0.2 mg/dL (ref 0.2–1.2)
Total Protein: 6.7 g/dL (ref 6.1–8.1)

## 2016-12-08 LAB — TSH: TSH: 1.09 mIU/L

## 2016-12-20 DIAGNOSIS — Z96653 Presence of artificial knee joint, bilateral: Secondary | ICD-10-CM | POA: Insufficient documentation

## 2016-12-22 ENCOUNTER — Other Ambulatory Visit: Payer: Self-pay | Admitting: Internal Medicine

## 2017-01-14 ENCOUNTER — Telehealth: Payer: Self-pay | Admitting: *Deleted

## 2017-01-14 DIAGNOSIS — Z0110 Encounter for examination of ears and hearing without abnormal findings: Secondary | ICD-10-CM

## 2017-01-14 NOTE — Telephone Encounter (Signed)
Ok for referral?

## 2017-01-14 NOTE — Telephone Encounter (Signed)
Patient called and stated that her Hearing Dr. Is requesting a referral to be placed for patient to have a hearing test. Patient would like to go to [facility]. Please Advise.

## 2017-01-15 NOTE — Progress Notes (Signed)
[facility]  [name]: [name] DOB: [birth year]   REASON FOR VISIT: Follow-up for memory loss/mild cognitive impairment HISTORY FROM: [name]    HISTORY OF PRESENT ILLNESS:HISTORY [name] is a 83 yo RH AAF referred by her primary care Dr. Baird Cancer for evaluation of memory trouble, drive herself to office alone at today's clinical visit. 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 Nicholes Calamity under general anesthesia, woke up from surgery, she noticed mild memory trouble, she has short-term memory trouble, has been persistent since then, she denies difficulty talking, no strokelike symptoms then. She lives at home with her family, highly functional, driving, independent at daily activity, able to keep her check in balance, She suffered long-standing history of bipolar disorder, on polypharmacy treatment, this including Trileptal 300 mg daily, Zoloft 100 mg a day, Wellbutrin 150 mg 3 tablets a day, She had accident of sudden onset dizziness couple days ago, after dinner, she felt lightheaded, has to crawling upstairs, was helped by her husband to get up, then she fell to the ground, whole-body shaking, no loss of consciousness. She has baseline mild gait difficulty due to her low back pain, bilateral knee pain, She denied a family history of dementia, CT head in 2013, Unchanged mild atrophy and microvascular ischemic disease without acute intracranial process. She had a history of chronic systolic heart failure, left bundle branch block, status post biventricular ICD insertion in 2009, underwent device generator change out Sept 2014. S She presents for one year evaluation. She reports feeling dizzy Tuesday night with improvement by Wednesday morning. She also reports upper body "shaking". The only pain she  has is from arthritis. She gets some mild edema in her right ankle. She reports her blood pressure at home usually runs 110-120/60-70. Sometimes when she gets up quickly, she felt lightheaded.  UPDATE April 8th 2016: She is overall doing very well, only has occasionally dizziness, especially when she gets up quickly, today's Mini-Mental status examination is 29 out of 30 CAT scan of the brain showed mild small vessel disease, no acute lesions, EEG showed mild slowing  UPDATE July 16 2016:YY She drive here herself, she continue complains of worsening memory loss, she lives with her husband in their house of more than 78 years old, she is independent in her daily activity, exercise regularly,  Following senior emexercise program on TV, she has mild low back pain, cause mild gait difficulty sometimes, radiating pain to bilateral lower extremity, she has no bowel and bladder incontinence. UPDATE 10/10/2018CM [name], 83 year old female returns for follow-up with history of memory loss mild cognitive impairment. She lives with her husband. She no longer does the finances but is basically independent in most other activities. She continues to cook without burning any recipes. She continues to drive without getting lost. She exercises regularly by walking short distances she has had one fall in the last 6 months, she missed a step in the darkness. She does not do anything to really stimulate her memory such as cross words etc. She continues to complain of mild low back pain no bowel or bladder incontinence. She had side effects to 10 mg of Aricept so she cut the dose in half however she still complains with drowsiness and fogginess on the medication. She has a history of bipolar disorder and is on polypharmacy.  She returns for reevaluation   REVIEW OF SYSTEMS: Full 14 system review of systems performed and notable only for those listed, all others are neg:  Constitutional: neg  Cardiovascular:  neg Ear/Nose/Throat: neg  Skin: neg Eyes: neg Respiratory: neg Gastroitestinal: neg  Hematology/Lymphatic: neg  Endocrine: neg Musculoskeletal:neg Allergy/Immunology: Environmental allergies Neurological: Memory loss, tremors Psychiatric: Depression and anxiety Sleep : neg   ALLERGIES: Allergies  Allergen Reactions  . Iodine Shortness Of Breath    Iodine contrast, CHF , SOB  . Shellfish Allergy Shortness Of Breath  . Aspirin Nausea And Vomiting  . Codeine Nausea And Vomiting    HOME MEDICATIONS: Outpatient Medications Prior to Visit  Medication Sig Dispense Refill  . acetaminophen (TYLENOL) 325 MG tablet Take 2 tablets (650 mg total) by mouth every 6 (six) hours as needed for mild pain (or Fever >/= 101).    Marland Kitchen buPROPion (WELLBUTRIN XL) 150 MG 24 hr tablet Take 3 tablets (450 mg total) by mouth daily. 270 tablet 3  . cholecalciferol (VITAMIN D) 1000 UNITS tablet Take 1,000 Units by mouth daily.    . furosemide (LASIX) 40 MG tablet Take one (1) tablet (40 mg total) by mouth every morning and and half (1/2) tablet (20 mg total) by mouth every evening. 135 tablet 3  . levothyroxine (SYNTHROID, LEVOTHROID) 100 MCG tablet Take 1 tablet (100 mcg total) by mouth daily before breakfast. 90 tablet 3  . Magnesium 400 MG CAPS Take 400 mg by mouth daily.    . metoprolol tartrate (LOPRESSOR) 25 MG tablet TAKE 1 TABLET BY MOUTH  EVERY MORNING AND ONE-HALF  TABLET EVERY EVENING 135 tablet 1  . olmesartan (BENICAR) 40 MG tablet TAKE 1 TABLET BY MOUTH  EVERY MORNING FOR BLOOD  PRESSURE 90 tablet 1  . Omega-3 Fatty Acids (FISH OIL) 1200 MG CAPS Take 1,200 mg by mouth every morning.    Marland Kitchen omeprazole (PRILOSEC) 40 MG capsule TAKE 1 CAPSULE BY MOUTH  DAILY FOR STOMACH 90 capsule 1  . Oxcarbazepine (TRILEPTAL) 300 MG tablet Take 300 mg by mouth every morning.     . potassium chloride (K-DUR,KLOR-CON) 10 MEQ tablet TAKE 1 TABLET BY MOUTH  DAILY 90 tablet 1  . sertraline (ZOLOFT) 100 MG tablet Take 200  mg by mouth every morning.     Marland Kitchen spironolactone (ALDACTONE) 25 MG tablet TAKE 1 TABLET BY MOUTH  EVERY MORNING 90 tablet 1  . tiZANidine (ZANAFLEX) 4 MG tablet TAKE 1 TABLET EVERY 6 HOURS AS NEEDED FOR MUSCLE SPASM. 60 tablet 2  . vitamin E 1000 UNIT capsule Take 1,000 Units by mouth daily.    Marland Kitchen donepezil (ARICEPT) 10 MG tablet Take 1 tablet (10 mg total) by mouth at bedtime. ([name] taking differently: Take 5 mg by mouth at bedtime. ) 90 tablet 4   No facility-administered medications prior to visit.     PAST MEDICAL HISTORY: Past Medical History:  Diagnosis Date  . Arthritis   . Cancer [facility])    left breast cancer   . CHF (congestive heart failure) ([place])    PACEMAKER & DEFIB  . Complication of anesthesia    hypotensive after back surgery in 2006- reports on chart   . Depression   . Dyslipidemia   . Fainted 04/21/06   AT [place]  . GERD (gastroesophageal reflux disease)   . Headache(784.0)   . Hearing loss   . HLD (hyperlipidemia)   . Hypertension   . Hypothyroidism   . ICD (implantable cardiac defibrillator) in place  pt has pacer/icd  . ICD (implantable cardiac defibrillator), biventricular, in situ   . LBBB (left bundle branch block)   . Memory loss   . Nonischemic cardiomyopathy ([place])   . Normal coronary arteries    s/p cardiac cath 2007  . Pacemaker    Guidant Device  . Syncope   . Systolic CHF ([place])   . Vertigo   . Wears glasses     PAST SURGICAL HISTORY: Past Surgical History:  Procedure Laterality Date  . BACK SURGERY     lumbar fusion   . BREAST SURGERY  2000   LUMP REMOVAL. STAGE 1 CANCER  . CARDIAC CATHETERIZATION    . CATARACT EXTRACTION    . EYE SURGERY    . IMPLANTABLE CARDIOVERTER DEFIBRILLATOR GENERATOR CHANGE N/A 12/18/2012   Procedure: IMPLANTABLE CARDIOVERTER DEFIBRILLATOR GENERATOR CHANGE;  Surgeon: [name], MD;  Location: [facility] CATH LAB;  Service: Cardiovascular;  Laterality: N/A;  . JOINT REPLACEMENT  06/14/01   right  . LUMBAR FUSION   2006  . MASS EXCISION  11/08/2011   Procedure: EXCISION MASS;  Surgeon: [name], MD;  Location: [facility] ORS;  Service: General;  Laterality: Left;  Excision Left Thigh Mass  . MASTECTOMY, PARTIAL  2008   GOT PACEMAKER AND DEFIB AT THAT TIME  . PACEMAKER INSERTION  04/23/06  . TOTAL KNEE ARTHROPLASTY  05/17/01   RIGHT KNEE  . TOTAL KNEE ARTHROPLASTY Left 11/29/2014   Procedure: TOTAL LEFT KNEE ARTHROPLASTY;  Surgeon: Paralee Cancel, MD;  Location: [facility] ORS;  Service: Orthopedics;  Laterality: Left;    FAMILY HISTORY: Family History  Problem Relation Age of Onset  . Hypertension Mother   . Arthritis Mother   . Hypertension Father   . Hypertension Brother   . Hypertension Brother     SOCIAL HISTORY: Social History   Social History  . Marital status: Married    Spouse name: N/A  . Number of children: 1  . Years of education: Masters   Occupational History  . Retired    Social History Main Topics  . Smoking status: Never Smoker  . Smokeless tobacco: Never Used  . Alcohol use No  . Drug use: No  . Sexual activity: Not Currently    Birth control/ protection: Post-menopausal   Other Topics Concern  . Not on file   Social History Narrative   Lives at home with husband.   Right-handed.      As of 07/28/2014   Diet: No special diet   Caffeine: yes, Chocolate, tea and sodas    Married: YES, 1970   House: Yes, 2 stories, 2-3 persons live in home   Pets: No   Current/Past profession: Engineer, mining, Nurse Practitioner    Exercise: Yes 2-3 x weekly   Living Will: Yes   DNR: No   POA/HPOA: No           PHYSICAL EXAM  Vitals:   01/16/17 1003  BP: 124/67  Pulse: 66  Weight: 122 lb 6.4 oz (55.5 kg)  Height: 5' (1.524 m)   Body mass index is 23.9 kg/m.  Generalized: Well developed, in no acute distress  Head: normocephalic and atraumatic,. Oropharynx benign  Neck: Supple, no carotid bruits  Cardiac: Regular rate rhythm, no murmur  Musculoskeletal: No deformity    Neurological examination   Mentation: Alert AFT 9 Clock drawing 4/4 MMSE - Mini Mental State Exam 01/16/2017 07/16/2016 01/13/2016  Orientation to time 4 5 5   Orientation to Place 5 5 5  Registration 3 3 3   Attention/ Calculation 5 5 5   Recall 1 3 2   Language- name 2 objects 2 2 2   Language- repeat 1 1 1   Language- follow 3 step command 3 3 3   Language- read & follow direction 1 1 1   Write a sentence 1 1 1   Copy design 1 1 1   Total score 27 30 29    Follows all commands speech and language fluent.   Cranial nerve II-XII: Fundoscopic exam reveals sharp disc margins.Pupils were equal round reactive to light extraocular movements were full, visual field were full on confrontational test. Facial sensation and strength were normal. hearing was intact to finger rubbing bilaterally. Uvula tongue midline. head turning and shoulder shrug were normal and symmetric.Tongue protrusion into cheek strength was normal. Motor: Mild bilateral toe extension flexion weakness , bilateral knee pain limits exam  Sensory: normal and symmetric to light touch, pinprick, and  Vibration, in the upper and lower extremities  Coordination: finger-nose-finger, heel-to-shin bilaterally, no dysmetria Reflexes: Brachioradialis 2/2, biceps 2/2, triceps 2/2, patellar absent , Achilles trace  plantar responses were flexor bilaterally. Gait and Station: Rising up from seated position without assistance, mild antalgic gait no assistive device  DIAGNOSTIC DATA (LABS, IMAGING, TESTING) - I reviewed [name] records, labs, notes, testing and imaging myself where available.  Lab Results  Component Value Date   WBC 9.2 08/31/2016   HGB 12.2 08/31/2016   HCT 38.7 08/31/2016   MCV 88.2 08/31/2016   PLT 132 (L) 08/31/2016      Component Value Date/Time   NA 143 12/07/2016 1215   NA 139 10/05/2015 0944   K 5.0 12/07/2016 1215   CL 107 12/07/2016 1215   CO2 24 12/07/2016 1215   GLUCOSE 73 12/07/2016 1215   BUN 28 (H)  12/07/2016 1215   BUN 25 10/05/2015 0944   CREATININE 1.21 (H) 12/07/2016 1215   CALCIUM 9.9 12/07/2016 1215   PROT 6.7 12/07/2016 1215   PROT 6.4 10/05/2015 0944   ALBUMIN 4.1 12/07/2016 1215   ALBUMIN 4.3 10/05/2015 0944   AST 23 12/07/2016 1215   ALT 26 12/07/2016 1215   ALKPHOS 66 12/07/2016 1215   BILITOT 0.2 12/07/2016 1215   BILITOT <0.2 10/05/2015 0944   GFRNONAA 42 (L) 12/07/2016 1215   GFRAA 49 (L) 12/07/2016 1215   Lab Results  Component Value Date   CHOL 167 02/27/2016   HDL 66 02/27/2016   LDLCALC 84 02/27/2016   TRIG 84 02/27/2016   CHOLHDL 2.5 02/27/2016   No results found for: HGBA1C Lab Results  Component Value Date   VITAMINB12 1,117 (H) 07/28/2014   Lab Results  Component Value Date   TSH 1.09 12/07/2016      ASSESSMENT AND PLAN  83 y.o. year old female  has a past medical history of Mild Cognitive impairment, gait abnormality bilateral knee pain. [name] has had side effects to Aricept.                                     Discontinue Aricept due to side effects Namenda starter pack take as directed will call in to [facility]  For your mild cognitive impairment important to do exercises to stimulate the memory such as cross words , word search, Scrabble, any strategy games. Gait abnormality multifactorial to include bilateral knee pain chronic low back pain mild toe extension weakness she will continue to exercises by  walking at least 30 minutes a day Pt is not interested in research study for memory Follow up 6 months   I spent 25 min in total face to face time with the [name] more than 50% of which was spent counseling and coordination of care, reviewing test results reviewing medications and discussing and reviewing the diagnosis of Mild cognitive impairment and further treatment options. Will discontinue Aricept and start Namenda. Made aware of side effects of Namenda.. Also the importance of exercise the memory by cross words word search  etc. ,        [name], [facility], [facility], APRN  [facility] [address] [place], [place] [address] [phone]

## 2017-01-15 NOTE — Telephone Encounter (Signed)
Referral placed. Patient notified. 

## 2017-01-16 ENCOUNTER — Encounter: Payer: Self-pay | Admitting: Nurse Practitioner

## 2017-01-16 ENCOUNTER — Ambulatory Visit (INDEPENDENT_AMBULATORY_CARE_PROVIDER_SITE_OTHER): Payer: Medicare Other | Admitting: Nurse Practitioner

## 2017-01-16 VITALS — BP 124/67 | HR 66 | Ht 60.0 in | Wt 122.4 lb

## 2017-01-16 DIAGNOSIS — R269 Unspecified abnormalities of gait and mobility: Secondary | ICD-10-CM | POA: Diagnosis not present

## 2017-01-16 DIAGNOSIS — R413 Other amnesia: Secondary | ICD-10-CM | POA: Diagnosis not present

## 2017-01-16 DIAGNOSIS — G3184 Mild cognitive impairment, so stated: Secondary | ICD-10-CM | POA: Diagnosis not present

## 2017-01-16 MED ORDER — MEMANTINE HCL 28 X 5 MG & 21 X 10 MG PO TABS: ORAL_TABLET | ORAL | 0 refills | Status: DC

## 2017-01-16 NOTE — Patient Instructions (Signed)
Discontinue Aricept due to side effects Namenda starter pack take as directed will call in to [place]  For your mild cognitive impairment important to do exercises to stimulate the memory such as cross words , word search, Scrabble, any strategy games. Exercises by walking at least 30 minutes a day Pt is not interested in research study Follow up 6 months

## 2017-01-16 NOTE — Progress Notes (Signed)
Fax confirmation received.  for memantine titration pack [place] [phone]. sy

## 2017-01-17 ENCOUNTER — Ambulatory Visit
Admission: RE | Admit: 2017-01-17 | Discharge: 2017-01-17 | Disposition: A | Discharge status: Home / Self Care | Payer: Medicare Other | Source: Ambulatory Visit | Attending: Nurse Practitioner | Admitting: Nurse Practitioner

## 2017-01-17 DIAGNOSIS — E2839 Other primary ovarian failure: Secondary | ICD-10-CM

## 2017-01-22 NOTE — Progress Notes (Signed)
I have reviewed and agreed above plan. 

## 2017-01-24 ENCOUNTER — Telehealth: Payer: Self-pay | Admitting: Nurse Practitioner

## 2017-01-24 NOTE — Telephone Encounter (Signed)
Pt calling to inform that the medication that NP Hoyle Sauer called in for her was denied by her insurance.  Pt is asking for a call.

## 2017-01-24 NOTE — Telephone Encounter (Signed)
LMVM for pt that returning call. This may be that needs to be written differently other then titration pack.  Will call pharmacy tomorrow and check this out.

## 2017-01-25 NOTE — Telephone Encounter (Signed)
memantine titration pack is not covered, can we try loose tablets with instructions, if so will call in.

## 2017-01-28 MED ORDER — MEMANTINE HCL 5 MG PO TABS: ORAL_TABLET | ORAL | 6 refills | Status: DC

## 2017-01-28 NOTE — Addendum Note (Signed)
Addended by: Otilio Jefferson on: 01/28/2017 01:45 PM   Modules accepted: Orders

## 2017-02-06 ENCOUNTER — Ambulatory Visit (INDEPENDENT_AMBULATORY_CARE_PROVIDER_SITE_OTHER): Payer: Medicare Other | Admitting: *Deleted

## 2017-02-06 DIAGNOSIS — I428 Other cardiomyopathies: Secondary | ICD-10-CM

## 2017-02-07 NOTE — Progress Notes (Signed)
Remote ICD transmission.   

## 2017-02-08 ENCOUNTER — Encounter: Payer: Self-pay | Admitting: Cardiology

## 2017-03-05 LAB — CUP PACEART REMOTE DEVICE CHECK
Battery Remaining Longevity: 78 mo
Battery Remaining Percentage: 100 %
Brady Statistic RA Percent Paced: 5 %
Brady Statistic RV Percent Paced: 100 %
Date Time Interrogation Session: 20181031141800
HighPow Impedance: 49 Ohm
Implantable Lead Implant Date: 20080116
Implantable Lead Implant Date: 20080116
Implantable Lead Implant Date: 20080116
Implantable Lead Location: 753859
Implantable Lead Location: 753860
Implantable Lead Location: 753860
Implantable Lead Model: 157
Implantable Lead Model: 4469
Implantable Lead Model: 4555
Implantable Lead Serial Number: 136532
Implantable Lead Serial Number: 161542
Implantable Lead Serial Number: 473495
Implantable Pulse Generator Implant Date: 20140911
Lead Channel Impedance Value: 429 Ohm
Lead Channel Impedance Value: 536 Ohm
Lead Channel Impedance Value: 825 Ohm
Lead Channel Pacing Threshold Amplitude: 0.4 V
Lead Channel Pacing Threshold Amplitude: 0.7 V
Lead Channel Pacing Threshold Amplitude: 0.9 V
Lead Channel Pacing Threshold Pulse Width: 0.4 ms
Lead Channel Pacing Threshold Pulse Width: 0.4 ms
Lead Channel Pacing Threshold Pulse Width: 0.8 ms
Lead Channel Setting Pacing Amplitude: 2 V
Lead Channel Setting Pacing Amplitude: 2 V
Lead Channel Setting Pacing Amplitude: 2.4 V
Lead Channel Setting Pacing Pulse Width: 0.4 ms
Lead Channel Setting Pacing Pulse Width: 0.8 ms
Lead Channel Setting Sensing Sensitivity: 0.6 mV
Lead Channel Setting Sensing Sensitivity: 1 mV
Pulse Gen Serial Number: 111765

## 2017-03-06 LAB — HM MAMMOGRAPHY

## 2017-03-07 ENCOUNTER — Encounter: Payer: Self-pay | Admitting: *Deleted

## 2017-03-27 ENCOUNTER — Encounter: Payer: Self-pay | Admitting: Internal Medicine

## 2017-03-27 ENCOUNTER — Ambulatory Visit (INDEPENDENT_AMBULATORY_CARE_PROVIDER_SITE_OTHER): Payer: Medicare Other | Admitting: Internal Medicine

## 2017-03-27 ENCOUNTER — Ambulatory Visit (INDEPENDENT_AMBULATORY_CARE_PROVIDER_SITE_OTHER): Payer: Medicare Other

## 2017-03-27 VITALS — BP 120/60 | HR 66 | Temp 97.7°F | Ht 60.0 in | Wt 120.0 lb

## 2017-03-27 DIAGNOSIS — M79671 Pain in right foot: Secondary | ICD-10-CM

## 2017-03-27 DIAGNOSIS — Z79899 Other long term (current) drug therapy: Secondary | ICD-10-CM | POA: Diagnosis not present

## 2017-03-27 DIAGNOSIS — M810 Age-related osteoporosis without current pathological fracture: Secondary | ICD-10-CM

## 2017-03-27 DIAGNOSIS — I10 Essential (primary) hypertension: Secondary | ICD-10-CM | POA: Diagnosis not present

## 2017-03-27 DIAGNOSIS — E782 Mixed hyperlipidemia: Secondary | ICD-10-CM

## 2017-03-27 DIAGNOSIS — I5022 Chronic systolic (congestive) heart failure: Secondary | ICD-10-CM | POA: Diagnosis not present

## 2017-03-27 DIAGNOSIS — Z0000 Encounter for general adult medical examination without abnormal findings: Secondary | ICD-10-CM

## 2017-03-27 DIAGNOSIS — G3184 Mild cognitive impairment, so stated: Secondary | ICD-10-CM

## 2017-03-27 DIAGNOSIS — M79672 Pain in left foot: Secondary | ICD-10-CM | POA: Diagnosis not present

## 2017-03-27 DIAGNOSIS — E034 Atrophy of thyroid (acquired): Secondary | ICD-10-CM | POA: Diagnosis not present

## 2017-03-27 DIAGNOSIS — G250 Essential tremor: Secondary | ICD-10-CM | POA: Diagnosis not present

## 2017-03-27 NOTE — Progress Notes (Signed)
Patient ID: [name], female   DOB: 20-Aug-1933, 83 y.o.   MRN: [id number]   Location:  [facility]  Place of Service:  OFFICE  Provider: Arletha Grippe, DO  Patient Care Team: [name], DO as PCP - General (Internal Medicine) [name], MD as Consulting Physician (General Surgery) Paralee Cancel, MD as Consulting Physician (Orthopedic Surgery) Marica Otter, Mather (Optometry) Marcial Pacas, MD as Consulting Physician (Neurology) [name], MD as Consulting Physician (Cardiology)  Extended Emergency Contact Information Primary Emergency Contact: Hanke,Basil Address: [address]          [place], [place] [address] [place] of [place] Phone: [phone] Relation: Spouse Secondary Emergency Contact: Newton,Doris Address: [address]          [place], [place] [address] [place] Phone: [phone] Relation: Other  Code Status: DNR Goals of Care: Advanced Directive information Advanced Directives 03/27/2017  Does Patient Have a Medical Advance Directive? Yes  Type of Advance Directive Living will;Out of facility DNR (pink MOST or yellow form)  Does patient want to make changes to medical advance directive? No - Patient declined  Copy of [place] in Chart? -  Pre-existing out of facility DNR order (yellow form or pink MOST form) Pink MOST form placed in chart (order not valid for inpatient use);Yellow form placed in chart (order not valid for inpatient use)     Chief Complaint  Patient presents with  . Annual Exam    Yearly check-up, EKG completed 04/2016, MMSE (27/30-completed 01/2017),+ fall risk, + depression screening   . Medication Refill    No refills needed     HPI: Patient is a 83 y.o. female seen in today for comprehensive exam. AWV note from today reviewed. She has pain in her heels. Nothing tried to alleviate pain. She staggers a lot especially when stand from seated position. She also has a whole body  tremor when she stands from seated position. She continues to have essential tremor in hands  She c/o hair falling out since end of last year. har has become very thin. She has no FHx female balding. No cold/hot intolerance, palpitations, CP or new SOB. She as a tremor that is worse with increased stress/anxiety  Skin tags on perineum - down to 1 tag. last colonoscopy in 06/2011. Rectal exam in Aug 2017 revealed no abnormalities.   Dementia - stable. She reports  '5mg'$  aricept caused next day sedation and she stopped med. Followed by neuro Dr Krista Blue. MMSE 27/30. Weight down 2 lbs since Aug 2018. She was started on namenda '5mg'$  titration but she states '10mg'$  BID caused next day drowsiness and she reduced dose herself to 1 tab daily.   Hx Left distal radial nondisplaced fx - she fell on May 10, 2015 in parking lot. She was sent for xray and had a fx. She is followed by Dr Cay Schillings. Hand is painful occasionally. left knee pain improved.  Dr Alvan Dame follows knee pain. She is applying cool compresses at night. She takes zanaflex prn   MDD - She has good and bad days for depression but overall stable. Weight up 1 lbs since May 2018. She has hx weight loss. She sees Dr Reece Levy for psych. Currently taking sertraline, trileptal and wellbutrin xl. She has mentioned her weight loss to her mental health provider. She has never had counseling.  Chronic systolic HF/AICD&pacer/LBBB/NICM - followed by cardiology Dr Lovena Le. She takes lasix with K supplement, lopressor, benicar, aldactone. EF  45-50% in 2015  (prev 20% in 2008). Pacer last checked in Oct 2018.  Hyperlipidemia - diet controlled. She also takes flax seed oil. LDL 84. She noticed bruising with oil now.  Hypothyroidism - stable on levothyroxine. TSH 1.09; T4 free 0.9  She is a poor historian due to memory loss. Hx obtained from chart.   Depression screen [facility] 2/9 03/27/2017 02/28/2016 10/05/2015 08/19/2015 11/26/2014  Decreased Interest 1 0 1 0 0  Down, Depressed,  Hopeless 3 0 1 0 0  PHQ - 2 Score 4 0 2 0 0  Altered sleeping 0 - - - -  Tired, decreased energy 2 - - - -  Change in appetite 0 - - - -  Feeling bad or failure about yourself  0 - - - -  Trouble concentrating 1 - - - -  Moving slowly or fidgety/restless 0 - - - -  Suicidal thoughts 0 - - - -  PHQ-9 Score 7 - - - -  Difficult doing work/chores Somewhat difficult - - - -    Fall Risk  03/27/2017 03/27/2017 12/07/2016 02/28/2016 11/30/2015  Falls in the past year? Yes Yes No Yes Yes  Number falls in past yr: 2 or more 2 or more - 1 1  Injury with Fall? No No - Yes Yes  Comment - - - Broke finger on left hand  -   MMSE - Mini Mental State Exam 01/16/2017 01/16/2017 07/16/2016 01/13/2016 01/20/2015 07/28/2014 07/16/2014  Orientation to time _0 Orientation to Place _1 Registration _2 Attention/ Calculation _3 Recall _4 Language- name 2 objects _5 Language- repeat _6 Language- follow 3 step command _7 Language- read & follow direction _8 0 1 1  Write a sentence _9 Copy design _10 Total score _11 Health Maintenance  Topic Date Due  . MAMMOGRAM  03/06/2018  . TETANUS/TDAP  01/08/2023  . INFLUENZA VACCINE  Completed  . DEXA SCAN  Completed  . PNA vac Low Risk Adult  Completed     Past Medical History:  Diagnosis Date  . Arthritis   . Cancer [facility])    left breast cancer   . CHF (congestive heart failure) ([place])    PACEMAKER & DEFIB  . Complication of anesthesia    hypotensive after back surgery in 2006- reports on chart   . Depression   . Dyslipidemia   . Fainted 04/21/06   AT [place]  . GERD (gastroesophageal reflux disease)   . Headache(784.0)   . Hearing loss   . HLD (hyperlipidemia)   . Hypertension   . Hypothyroidism   . ICD (implantable cardiac defibrillator) in place    pt has pacer/icd  . ICD (implantable cardiac  defibrillator), biventricular, in situ   . LBBB (left bundle branch block)   . Memory loss   . Nonischemic cardiomyopathy ([place])   . Normal coronary arteries    s/p cardiac cath 2007  . Pacemaker    Guidant Device  . Syncope   . Systolic CHF ([place])   .  Vertigo   . Wears glasses     Past Surgical History:  Procedure Laterality Date  . BACK SURGERY     lumbar fusion   . BREAST SURGERY  2000   LUMP REMOVAL. STAGE 1 CANCER  . CARDIAC CATHETERIZATION    . CATARACT EXTRACTION    . EYE SURGERY    . IMPLANTABLE CARDIOVERTER DEFIBRILLATOR GENERATOR CHANGE N/A 12/18/2012   Procedure: IMPLANTABLE CARDIOVERTER DEFIBRILLATOR GENERATOR CHANGE;  Surgeon: [name], MD;  Location: [facility] CATH LAB;  Service: Cardiovascular;  Laterality: N/A;  . JOINT REPLACEMENT  06/14/01   right  . LUMBAR FUSION  2006  . MASS EXCISION  11/08/2011   Procedure: EXCISION MASS;  Surgeon: [name], MD;  Location: [facility] ORS;  Service: General;  Laterality: Left;  Excision Left Thigh Mass  . MASTECTOMY, PARTIAL  2008   GOT PACEMAKER AND DEFIB AT THAT TIME  . PACEMAKER INSERTION  04/23/06  . TOTAL KNEE ARTHROPLASTY  05/17/01   RIGHT KNEE  . TOTAL KNEE ARTHROPLASTY Left 11/29/2014   Procedure: TOTAL LEFT KNEE ARTHROPLASTY;  Surgeon: Paralee Cancel, MD;  Location: [facility]S;  Service: Orthopedics;  Laterality: Left;    Family History  Problem Relation Age of Onset  . Hypertension Mother   . Arthritis Mother   . Hypertension Father   . Hypertension Brother   . Hypertension Brother    Family Status  Relation Name Status  . Mother Onalee Hua Deceased  . Father Jaquelyn Bitter Deceased  . Brother [employer]  . Brother [employer]  . Daughter Counselling psychologist    Social History   Socioeconomic History  . Marital status: Married    Spouse name: Not on file  . Number of children: 1  . Years of education: Masters  . Highest education level: Not on file  Social Needs  . Financial resource strain: Not hard at all  . Food insecurity  - worry: Never true  . Food insecurity - inability: Never true  . Transportation needs - medical: No  . Transportation needs - non-medical: No  Occupational History  . Occupation: Retired  Tobacco Use  . Smoking status: Never Smoker  . Smokeless tobacco: Never Used  Substance and Sexual Activity  . Alcohol use: No  . Drug use: No  . Sexual activity: Not Currently    Birth control/protection: Post-menopausal  Other Topics Concern  . Not on file  Social History Narrative   Lives at home with husband.   Right-handed.      As of 07/28/2014   Diet: No special diet   Caffeine: yes, Chocolate, tea and sodas    Married: YES, 1970   House: Yes, 2 stories, 2-3 persons live in home   Pets: No   Current/Past profession: Engineer, mining, Designer, jewellery    Exercise: Yes 2-3 x weekly   Living Will: Yes   DNR: No   POA/HPOA: No       Allergies  Allergen Reactions  . Iodine Shortness Of Breath    Iodine contrast, CHF , SOB  . Shellfish Allergy Shortness Of Breath  . Aspirin Nausea And Vomiting  . Codeine Nausea And Vomiting    Allergies as of 03/27/2017      Reactions   Iodine Shortness Of Breath   Iodine contrast, CHF , SOB   Shellfish Allergy Shortness Of Breath   Aspirin Nausea And Vomiting   Codeine Nausea And Vomiting      Medication List  Accurate as of 03/27/17  2:16 PM. Always use your most recent med list.          acetaminophen 325 MG tablet Commonly known as:  TYLENOL Take 2 tablets (650 mg total) by mouth every 6 (six) hours as needed for mild pain (or Fever >/= 101).   buPROPion 150 MG 24 hr tablet Commonly known as:  WELLBUTRIN XL Take 3 tablets (450 mg total) by mouth daily.   cholecalciferol 1000 units tablet Commonly known as:  VITAMIN D Take 1,000 Units by mouth daily.   Fish Oil 1200 MG Caps Take 1,200 mg by mouth every morning.   furosemide 40 MG tablet Commonly known as:  LASIX Take one (1) tablet (40 mg total) by mouth every  morning and and half (1/2) tablet (20 mg total) by mouth every evening.   levothyroxine 100 MCG tablet Commonly known as:  SYNTHROID, LEVOTHROID Take 1 tablet (100 mcg total) by mouth daily before breakfast.   Magnesium 250 MG Tabs Take 250 mg by mouth daily.   memantine 5 MG tablet Commonly known as:  NAMENDA Week 1 4m daily then week 2 1 tab twice daily, then week 3 1 am 2 tabs pm, then 2 tabs twice daily   metoprolol tartrate 25 MG tablet Commonly known as:  LOPRESSOR TAKE 1 TABLET BY MOUTH  EVERY MORNING AND ONE-HALF  TABLET EVERY EVENING   olmesartan 40 MG tablet Commonly known as:  BENICAR TAKE 1 TABLET BY MOUTH  EVERY MORNING FOR BLOOD  PRESSURE   omeprazole 40 MG capsule Commonly known as:  PRILOSEC TAKE 1 CAPSULE BY MOUTH  DAILY FOR STOMACH   Oxcarbazepine 300 MG tablet Commonly known as:  TRILEPTAL Take 300 mg by mouth every morning.   potassium chloride 10 MEQ tablet Commonly known as:  K-DUR,KLOR-CON TAKE 1 TABLET BY MOUTH  DAILY   sertraline 100 MG tablet Commonly known as:  ZOLOFT Take 200 mg by mouth every morning.   spironolactone 25 MG tablet Commonly known as:  ALDACTONE TAKE 1 TABLET BY MOUTH  EVERY MORNING   tiZANidine 4 MG tablet Commonly known as:  ZANAFLEX TAKE 1 TABLET EVERY 6 HOURS AS NEEDED FOR MUSCLE SPASM.   vitamin E 1000 UNIT capsule Take 1,000 Units by mouth daily.        Review of Systems:  Review of Systems  Unable to perform ROS: Other (memory loss)    Physical Exam: Vitals:   03/27/17 1409  BP: 120/60  Pulse: 66  Temp: 97.7 F (36.5 C)  TempSrc: Oral  SpO2: 97%  Weight: 120 lb (54.4 kg)  Height: 5' (1.524 m)   Body mass index is 23.44 kg/m. Physical Exam  Constitutional: She appears well-developed. No distress.  Frail appearing in NAD  HENT:  Head: Normocephalic and atraumatic.  Right Ear: External ear normal.  Left Ear: External ear normal.  Mouth/Throat: Oropharynx is clear and moist. No oropharyngeal  exudate.  MMM; no oral thrush  Eyes: EOM are normal. Pupils are equal, round, and reactive to light. No scleral icterus.  Neck: Normal range of motion. Neck supple. Carotid bruit is not present. No tracheal deviation present. No thyromegaly present.  Cardiovascular: Normal rate, regular rhythm and intact distal pulses. Exam reveals no gallop and no friction rub.  No murmur heard. No LE edema b/l. No calf TTP  Pulmonary/Chest: Effort normal. No respiratory distress. She has no wheezes. She has rales (inspiratory). She exhibits no tenderness. Right breast exhibits no inverted nipple, no  mass, no nipple discharge, no skin change and no tenderness. Left breast exhibits no inverted nipple, no mass, no nipple discharge, no skin change and no tenderness. Breasts are asymmetrical.    No rhonchi  Abdominal: Soft. Bowel sounds are normal. She exhibits no distension and no mass. There is no hepatosplenomegaly or hepatomegaly. There is no tenderness. There is no rebound and no guarding. No hernia.  Musculoskeletal: She exhibits edema and tenderness (b/l heels with palpable right spur, TTP). She exhibits no deformity.  No achilles tendon hypertrophy noted  Lymphadenopathy:    She has no cervical adenopathy.  Neurological: She is alert.  Skin: Skin is warm and dry. No rash noted.  Psychiatric: Her behavior is normal. Thought content normal. She exhibits a depressed mood.  Vitals reviewed.   Labs reviewed:  Basic Metabolic Panel: Recent Labs    08/31/16 1050 12/07/16 1215  NA 141 143  K 4.5 5.0  CL 107 107  CO2 27 24  GLUCOSE 88 73  BUN 31* 28*  CREATININE 1.00* 1.21*  CALCIUM 10.2 9.9  TSH 2.31 1.09   Liver Function Tests: Recent Labs    08/31/16 1050 12/07/16 1215  AST 23 23  ALT 27 26  ALKPHOS 67 66  BILITOT 0.3 0.2  PROT 6.7 6.7  ALBUMIN 3.9 4.1   No results for input(s): LIPASE, AMYLASE in the last 8760 hours. No results for input(s): AMMONIA in the last 8760  hours. CBC: Recent Labs    08/31/16 1050  WBC 9.2  NEUTROABS 6,532  HGB 12.2  HCT 38.7  MCV 88.2  PLT 132*   Lipid Panel: No results for input(s): CHOL, HDL, LDLCALC, TRIG, CHOLHDL, LDLDIRECT in the last 8760 hours. No results found for: HGBA1C  Procedures: No results found.  Assessment/Plan   ICD-10-CM   1. Well adult exam Z00.00   2. Heel pain, bilateral M79.671 Ambulatory referral to Podiatry   M79.672    probable heel spurs  3. High risk medication use Z79.899 CMP with eGFR  4. Mild cognitive impairment G31.84   5. Essential tremor G25.0 CMP with eGFR  6. Hypothyroidism due to acquired atrophy of thyroid E03.4 TSH  7. Benign hypertension I10 CMP with eGFR    Urinalysis with Reflex Microscopic  8. Chronic systolic heart failure (HCC) I50.22 CMP with eGFR  9. Mixed hyperlipidemia E78.2 Lipid Panel  10. Age-related osteoporosis without current pathological fracture M81.0 Vitamin D, 25-hydroxy    Discussed treatment of benign tremor - t/c klonopin vs gabapentin for possible orthostatic tremor. She prefers to hold off new med for now.  Influenza vaccine given today  CHANGE NAMENDA (MEMANTINE) TO 5MG 2 TIMES DAILY FOR MEMORY  OUR OFFICE WILL CONTACT YOU REGARDING PROLIA (FOR OSTEOPOROSIS) INJECTION COVERAGE BY YOUR INURANCE. If not covered, you will need pill for fragile bones  START OTC CALCIUM 1200MG DAILY WITH VITAMIN D3 2000 UNITS DAILY  Continue other medications as ordered. Recommend you change where you purchase flax seed oil to see if this helps reduce bruising  Will call with podiatry referral for heel pain  Flu shot given at [facility]  Will call with lab results  Follow up in 5 mos for MDD, dementia, thyroid  Aldin Drees S. Perlie Gold  [facility] [address] [place], [place] [address] [phone] Cell (Monday-Friday 8 AM - 5 PM) [phone] After 5 PM and follow prompts

## 2017-03-27 NOTE — Patient Instructions (Addendum)
CHANGE NAMENDA (MEMANTINE) TO 5MG  2 TIMES DAILY FOR MEMORY  OUR OFFICE WILL CONTACT YOU REGARDING PROLIA (FOR OSTEOPOROSIS) INJECTION COVERAGE BY YOUR INURANCE. If not covered, you will need pill for fragile bones  START OTC CALCIUM 1200MG  DAILY WITH VITAMIN D3 2000 UNITS DAILY  Continue other medications as ordered. Recommend you change where you purchase flax seed oil to see if this helps reduce bruising  Will call with podiatry referral for heel pain  Flu shot given  Will call with lab results  Follow up in 5 mos for MDD, dementia, thyroid  Keeping You Healthy  Get These Tests  Blood Pressure- Have your blood pressure checked by your healthcare provider at least once a year.  Normal blood pressure is 120/80.  Weight- Have your body mass index (BMI) calculated to screen for obesity.  BMI is a measure of body fat based on height and weight.  You can calculate your own BMI at [employer].it  Cholesterol- Have your cholesterol checked every year.  Diabetes- Have your blood sugar checked every year if you have high blood pressure, high cholesterol, a family history of diabetes or if you are overweight.  Pap Test - Have a pap test every 1 to 5 years if you have been sexually active.  If you are older than 65 and recent pap tests have been normal you may not need additional pap tests.  In addition, if you have had a hysterectomy  for benign disease additional pap tests are not necessary.  Mammogram-Yearly mammograms are essential for early detection of breast cancer  Screening for Colon Cancer- Colonoscopy starting at age 63. Screening may begin sooner depending on your family history and other health conditions.  Follow up colonoscopy as directed by your Gastroenterologist.  Screening for Osteoporosis- Screening begins at age 28 with bone density scanning, sooner if you are at higher risk for developing Osteoporosis.  Get these medicines  Calcium with Vitamin D- Your  body requires 1200-1500 mg of Calcium a day and [phone] IU of Vitamin D a day.  You can only absorb 500 mg of Calcium at a time therefore Calcium must be taken in 2 or 3 separate doses throughout the day.  Hormones- Hormone therapy has been associated with increased risk for certain cancers and heart disease.  Talk to your healthcare provider about if you need relief from menopausal symptoms.  Aspirin- Ask your healthcare provider about taking Aspirin to prevent Heart Disease and Stroke.  Get these Immuniztions  Flu shot- Every fall  Pneumonia shot- Once after the age of 41; if you are younger ask your healthcare provider if you need a pneumonia shot.  Tetanus- Every ten years.  Zostavax- Once after the age of 3 to prevent shingles.  Take these steps  Don't smoke- Your healthcare provider can help you quit. For tips on how to quit, ask your healthcare provider or go to www.smokefree.gov or call 1-800 QUIT-NOW.  Be physically active- Exercise 5 days a week for a minimum of 30 minutes.  If you are not already physically active, start slow and gradually work up to 30 minutes of moderate physical activity.  Try walking, dancing, bike riding, swimming, etc.  Eat a healthy diet- Eat a variety of healthy foods such as fruits, vegetables, whole grains, low fat milk, low fat cheeses, yogurt, lean meats, chicken, fish, eggs, dried beans, tofu, etc.  For more information go to www.thenutritionsource.org  Dental visit- Brush and floss teeth twice daily; visit your dentist twice a  year.  Eye exam- Visit your Optometrist or Ophthalmologist yearly.  Drink alcohol in moderation- Limit alcohol intake to one drink or less a day.  Never drink and drive.  Depression- Your emotional health is as important as your physical health.  If you're feeling down or losing interest in things you normally enjoy, please talk to your healthcare provider.  Seat Belts- can save your life; always wear  one  Smoke/Carbon Monoxide detectors- These detectors need to be installed on the appropriate level of your home.  Replace batteries at least once a year.  Violence- If anyone is threatening or hurting you, please tell your healthcare provider.  Living Will/ Health care power of attorney- Discuss with your healthcare provider and family.

## 2017-03-27 NOTE — Progress Notes (Signed)
Subjective:   [name] is a 83 y.o. female who presents for Medicare Annual (Subsequent) preventive examination.  Last AWV-02/28/2016       Objective:     Vitals: BP 120/60 (BP Location: Left Arm, Patient Position: Sitting)   Pulse 66   Temp 97.7 F (36.5 C) (Oral)   Ht 5' (1.524 m)   Wt 120 lb (54.4 kg)   SpO2 97%   BMI 23.44 kg/m   Body mass index is 23.44 kg/m.  Advanced Directives 03/27/2017 08/31/2016 07/26/2016 11/30/2015 10/05/2015 08/19/2015 05/10/2015  Does Patient Have a Medical Advance Directive? Yes Yes Yes Yes Yes Yes Yes  Type of Advance Directive Living will;Out of facility DNR (pink MOST or yellow form) Out of facility DNR (pink MOST or yellow form) [place];Living will;Out of facility DNR (pink MOST or yellow form) Living will;Healthcare Power of Attorney Living will Living will -  Does patient want to make changes to medical advance directive? No - Patient declined No - Patient declined No - Patient declined No - Patient declined No - Patient declined No - Patient declined -  Copy of [place] in Chart? - - - No - copy requested No - copy requested Yes No - copy requested  Pre-existing out of facility DNR order (yellow form or pink MOST form) Pink MOST form placed in chart (order not valid for inpatient use);Yellow form placed in chart (order not valid for inpatient use) Yellow form placed in chart (order not valid for inpatient use) - - - - -    Tobacco Social History   Tobacco Use  Smoking Status Never Smoker  Smokeless Tobacco Never Used     Counseling given: Not Answered   Clinical Intake:  Pre-visit preparation completed: No  Pain : No/denies pain     Nutritional Risks: None Diabetes: No  How often do you need to have someone help you when you read instructions, pamphlets, or other written materials from your doctor or pharmacy?: 1 - Never What is the last grade level you completed in school?: Masters  Degree  Interpreter Needed?: No  Information entered by :: Tyson Dense, RN  Past Medical History:  Diagnosis Date  . Arthritis   . Cancer [facility])    left breast cancer   . CHF (congestive heart failure) ([place])    PACEMAKER & DEFIB  . Complication of anesthesia    hypotensive after back surgery in 2006- reports on chart   . Depression   . Dyslipidemia   . Fainted 04/21/06   AT [place]  . GERD (gastroesophageal reflux disease)   . Headache(784.0)   . Hearing loss   . HLD (hyperlipidemia)   . Hypertension   . Hypothyroidism   . ICD (implantable cardiac defibrillator) in place    pt has pacer/icd  . ICD (implantable cardiac defibrillator), biventricular, in situ   . LBBB (left bundle branch block)   . Memory loss   . Nonischemic cardiomyopathy ([place])   . Normal coronary arteries    s/p cardiac cath 2007  . Pacemaker    Guidant Device  . Syncope   . Systolic CHF ([place])   . Vertigo   . Wears glasses    Past Surgical History:  Procedure Laterality Date  . BACK SURGERY     lumbar fusion   . BREAST SURGERY  2000   LUMP REMOVAL. STAGE 1 CANCER  . CARDIAC CATHETERIZATION    . CATARACT EXTRACTION    .  EYE SURGERY    . IMPLANTABLE CARDIOVERTER DEFIBRILLATOR GENERATOR CHANGE N/A 12/18/2012   Procedure: IMPLANTABLE CARDIOVERTER DEFIBRILLATOR GENERATOR CHANGE;  Surgeon: [name], MD;  Location: [facility] CATH LAB;  Service: Cardiovascular;  Laterality: N/A;  . JOINT REPLACEMENT  06/14/01   right  . LUMBAR FUSION  2006  . MASS EXCISION  11/08/2011   Procedure: EXCISION MASS;  Surgeon: [name], MD;  Location: [facility] ORS;  Service: General;  Laterality: Left;  Excision Left Thigh Mass  . MASTECTOMY, PARTIAL  2008   GOT PACEMAKER AND DEFIB AT THAT TIME  . PACEMAKER INSERTION  04/23/06  . TOTAL KNEE ARTHROPLASTY  05/17/01   RIGHT KNEE  . TOTAL KNEE ARTHROPLASTY Left 11/29/2014   Procedure: TOTAL LEFT KNEE ARTHROPLASTY;  Surgeon: Paralee Cancel, MD;  Location: [facility] ORS;  Service: Orthopedics;   Laterality: Left;   Family History  Problem Relation Age of Onset  . Hypertension Mother   . Arthritis Mother   . Hypertension Father   . Hypertension Brother   . Hypertension Brother    Social History   Socioeconomic History  . Marital status: Married    Spouse name: None  . Number of children: 1  . Years of education: Masters  . Highest education level: None  Social Needs  . Financial resource strain: Not hard at all  . Food insecurity - worry: Never true  . Food insecurity - inability: Never true  . Transportation needs - medical: No  . Transportation needs - non-medical: No  Occupational History  . Occupation: Retired  Tobacco Use  . Smoking status: Never Smoker  . Smokeless tobacco: Never Used  Substance and Sexual Activity  . Alcohol use: No  . Drug use: No  . Sexual activity: Not Currently    Birth control/protection: Post-menopausal  Other Topics Concern  . None  Social History Narrative   Lives at home with husband.   Right-handed.      As of 07/28/2014   Diet: No special diet   Caffeine: yes, Chocolate, tea and sodas    Married: YES, 1970   House: Yes, 2 stories, 2-3 persons live in home   Pets: No   Current/Past profession: Engineer, mining, Nurse Practitioner    Exercise: Yes 2-3 x weekly   Living Will: Yes   DNR: No   POA/HPOA: No       Outpatient Encounter Medications as of 03/27/2017  Medication Sig  . acetaminophen (TYLENOL) 325 MG tablet Take 2 tablets (650 mg total) by mouth every 6 (six) hours as needed for mild pain (or Fever >/= 101).  Marland Kitchen buPROPion (WELLBUTRIN XL) 150 MG 24 hr tablet Take 3 tablets (450 mg total) by mouth daily.  . cholecalciferol (VITAMIN D) 1000 UNITS tablet Take 1,000 Units by mouth daily.  . furosemide (LASIX) 40 MG tablet Take one (1) tablet (40 mg total) by mouth every morning and and half (1/2) tablet (20 mg total) by mouth every evening.  Marland Kitchen levothyroxine (SYNTHROID, LEVOTHROID) 100 MCG tablet Take 1 tablet (100 mcg  total) by mouth daily before breakfast.  . Magnesium 250 MG TABS Take 250 mg by mouth daily.  . memantine (NAMENDA) 5 MG tablet Week 1 5mg  daily then week 2 1 tab twice daily, then week 3 1 am 2 tabs pm, then 2 tabs twice daily  . metoprolol tartrate (LOPRESSOR) 25 MG tablet TAKE 1 TABLET BY MOUTH  EVERY MORNING AND ONE-HALF  TABLET EVERY EVENING  . olmesartan (BENICAR) 40 MG tablet  TAKE 1 TABLET BY MOUTH  EVERY MORNING FOR BLOOD  PRESSURE  . Omega-3 Fatty Acids (FISH OIL) 1200 MG CAPS Take 1,200 mg by mouth every morning.  Marland Kitchen omeprazole (PRILOSEC) 40 MG capsule TAKE 1 CAPSULE BY MOUTH  DAILY FOR STOMACH  . Oxcarbazepine (TRILEPTAL) 300 MG tablet Take 300 mg by mouth every morning.   . potassium chloride (K-DUR,KLOR-CON) 10 MEQ tablet TAKE 1 TABLET BY MOUTH  DAILY  . sertraline (ZOLOFT) 100 MG tablet Take 200 mg by mouth every morning.   Marland Kitchen spironolactone (ALDACTONE) 25 MG tablet TAKE 1 TABLET BY MOUTH  EVERY MORNING  . tiZANidine (ZANAFLEX) 4 MG tablet TAKE 1 TABLET EVERY 6 HOURS AS NEEDED FOR MUSCLE SPASM.  Marland Kitchen vitamin E 1000 UNIT capsule Take 1,000 Units by mouth daily.  . [DISCONTINUED] Magnesium 400 MG CAPS Take 400 mg by mouth daily.   No facility-administered encounter medications on file as of 03/27/2017.     Activities of Daily Living In your present state of health, do you have any difficulty performing the following activities: 03/27/2017  Hearing? Y  Vision? N  Difficulty concentrating or making decisions? Y  Walking or climbing stairs? N  Dressing or bathing? N  Doing errands, shopping? N  Preparing Food and eating ? N  Using the Toilet? N  In the past six months, have you accidently leaked urine? N  Do you have problems with loss of bowel control? N  Managing your Medications? N  Managing your Finances? N  Housekeeping or managing your Housekeeping? N  Some recent data might be hidden    Patient Care Team: [name], DO as PCP - General (Internal Medicine) [name], MD as Consulting Physician (General Surgery) Paralee Cancel, MD as Consulting Physician (Orthopedic Surgery) Marica Otter, [place] (Optometry) Marcial Pacas, MD as Consulting Physician (Neurology) [name], MD as Consulting Physician (Cardiology)    Assessment:   This is a routine wellness examination for Marilou.  Exercise Activities and Dietary recommendations Current Exercise Habits: Home exercise routine, Type of exercise: stretching, Time (Minutes): 30, Frequency (Times/Week): 4, Weekly Exercise (Minutes/Week): 120, Intensity: Mild, Exercise limited by: None identified  Goals    . DIET - INCREASE WATER INTAKE     Pt will try to increase water intake to 3-4 cups a day       Fall Risk Fall Risk  03/27/2017 12/07/2016 02/28/2016 11/30/2015 10/05/2015  Falls in the past year? Yes No Yes Yes Yes  Number falls in past yr: 2 or more - 1 1 1   Injury with Fall? No - Yes Yes No  Comment - - Broke finger on left hand  - -   Is the patient's home free of loose throw rugs in walkways, pet beds, electrical cords, etc?   yes      Grab bars in the bathroom? yes      Handrails on the stairs?   yes      Adequate lighting?   yes  Timed Get Up and Go performed: 22 seconds, fall risk  Depression Screen PHQ 2/9 Scores 03/27/2017 02/28/2016 10/05/2015 08/19/2015  PHQ - 2 Score 4 0 2 0  PHQ- 9 Score 7 - - -     Cognitive Function MMSE - Mini Mental State Exam 01/16/2017 01/16/2017 07/16/2016 01/13/2016 01/20/2015  Orientation to time 4 4 5 5 4   Orientation to Place 5 5 5 5 5   Registration 3 3 3 3 3   Attention/ Calculation 5 5 5 5  5  Recall 1 1 3 2 2   Language- name 2 objects 2 2 2 2 2   Language- repeat 1 1 1 1 1   Language- follow 3 step command 3 3 3 3 2   Language- read & follow direction 1 1 1 1  0  Write a sentence 1 1 1 1 1   Copy design 1 1 1 1 1   Total score 27 27 30 29 26         Immunization History  Administered Date(s) Administered  . Influenza-Unspecified 12/08/2013,  01/10/2015, 12/09/2015, 01/12/2017  . Pneumococcal Conjugate-13 04/09/2012  . Pneumococcal Polysaccharide-23 02/28/2016  . Tdap 01/07/2013  . Zoster 08/05/2014    Qualifies for Shingles Vaccine?yes, educated and declined for now  Screening Tests Health Maintenance  Topic Date Due  . MAMMOGRAM  03/06/2018  . TETANUS/TDAP  01/08/2023  . INFLUENZA VACCINE  Completed  . DEXA SCAN  Completed  . PNA vac Low Risk Adult  Completed    Cancer Screenings: Lung: Low Dose CT Chest recommended if Age 59-80 years, 30 pack-year currently smoking OR have quit w/in 15years. Patient does not qualify. Breast:  Up to date on Mammogram? Yes   Up to date of Bone Density/Dexa? Yes Colorectal: up to date  Additional Screenings:  Hepatitis B/HIV/Syphillis:Not indicated Hepatitis C Screening: Not indicated     Plan:    I have personally reviewed and addressed the Medicare Annual Wellness questionnaire and have noted the following in the patient's chart:  A. Medical and social history B. Use of alcohol, tobacco or illicit drugs  C. Current medications and supplements D. Functional ability and status E.  Nutritional status F.  Physical activity G. Advance directives H. List of other physicians I.  Hospitalizations, surgeries, and ER visits in previous 12 months J.  [place] to include hearing, vision, cognitive, depression L. Referrals and appointments - none  In addition, I have reviewed and discussed with patient certain preventive protocols, quality metrics, and best practice recommendations. A written personalized care plan for preventive services as well as general preventive health recommendations were provided to patient.  See attached scanned questionnaire for additional information.   Signed,   Tyson Dense, RN Nurse Health Advisor   Quick Notes   Health Maintenance: Shingrix declined for now.      Abnormal Screen: PHQ-9:7. MMSE 27/30 on  01/16/2017     Patient Concerns: bilateral heels sharp pain at night starting 4 months ago, staggering when she walks, would like dexa results to be gone over today with provider     Nurse Concerns: none

## 2017-03-27 NOTE — Patient Instructions (Signed)
[name] , Thank you for taking time to come for your Medicare Wellness Visit. I appreciate your ongoing commitment to your health goals. Please review the following plan we discussed and let me know if I can assist you in the future.   Screening recommendations/referrals: Colonoscopy excluded, you are over age 81 Mammogram up to date. Due 03/07/2018 Bone Density up to date Recommended yearly ophthalmology/optometry visit for glaucoma screening and checkup Recommended yearly dental visit for hygiene and checkup  Vaccinations: Influenza vaccine up to date. Due 2019 fall season Pneumococcal vaccine up to date Tdap vaccine up to date. Due 01/08/2023 Shingles vaccine due, declined for now    Advanced directives: In Chart  Conditions/risks identified: none  Next appointment: Dr. Eulas Post 03/27/2017 @ 2:45pm            Stacey Dense, RN 03/31/2018 @ 12:45pm   Preventive Care 65 Years and Older, Female Preventive care refers to lifestyle choices and visits with your health care provider that can promote health and wellness. What does preventive care include?  A yearly physical exam. This is also called an annual well check.  Dental exams once or twice a year.  Routine eye exams. Ask your health care provider how often you should have your eyes checked.  Personal lifestyle choices, including:  Daily care of your teeth and gums.  Regular physical activity.  Eating a healthy diet.  Avoiding tobacco and drug use.  Limiting alcohol use.  Practicing safe sex.  Taking low-dose aspirin every day.  Taking vitamin and mineral supplements as recommended by your health care provider. What happens during an annual well check? The services and screenings done by your health care provider during your annual well check will depend on your age, overall health, lifestyle risk factors, and family history of disease. Counseling  Your health care provider may ask you questions about  your:  Alcohol use.  Tobacco use.  Drug use.  Emotional well-being.  Home and relationship well-being.  Sexual activity.  Eating habits.  History of falls.  Memory and ability to understand (cognition).  Work and work Statistician.  Reproductive health. Screening  You may have the following tests or measurements:  Height, weight, and BMI.  Blood pressure.  Lipid and cholesterol levels. These may be checked every 5 years, or more frequently if you are over 32 years old.  Skin check.  Lung cancer screening. You may have this screening every year starting at age 50 if you have a 30-pack-year history of smoking and currently smoke or have quit within the past 15 years.  Fecal occult blood test (FOBT) of the stool. You may have this test every year starting at age 78.  Flexible sigmoidoscopy or colonoscopy. You may have a sigmoidoscopy every 5 years or a colonoscopy every 10 years starting at age 7.  Hepatitis C blood test.  Hepatitis B blood test.  Sexually transmitted disease (STD) testing.  Diabetes screening. This is done by checking your blood sugar (glucose) after you have not eaten for a while (fasting). You may have this done every 1-3 years.  Bone density scan. This is done to screen for osteoporosis. You may have this done starting at age 24.  Mammogram. This may be done every 1-2 years. Talk to your health care provider about how often you should have regular mammograms. Talk with your health care provider about your test results, treatment options, and if necessary, the need for more tests. Vaccines  Your health care provider may  recommend certain vaccines, such as:  Influenza vaccine. This is recommended every year.  Tetanus, diphtheria, and acellular pertussis (Tdap, Td) vaccine. You may need a Td booster every 10 years.  Zoster vaccine. You may need this after age 41.  Pneumococcal 13-valent conjugate (PCV13) vaccine. One dose is recommended  after age 35.  Pneumococcal polysaccharide (PPSV23) vaccine. One dose is recommended after age 93. Talk to your health care provider about which screenings and vaccines you need and how often you need them. This information is not intended to replace advice given to you by your health care provider. Make sure you discuss any questions you have with your health care provider. Document Released: 04/22/2015 Document Revised: 12/14/2015 Document Reviewed: 01/25/2015 Elsevier Interactive Patient Education  2017 [place] Prevention in the Home Falls can cause injuries. They can happen to people of all ages. There are many things you can do to make your home safe and to help prevent falls. What can I do on the outside of my home?  Regularly fix the edges of walkways and driveways and fix any cracks.  Remove anything that might make you trip as you walk through a door, such as a raised step or threshold.  Trim any bushes or trees on the path to your home.  Use bright outdoor lighting.  Clear any walking paths of anything that might make someone trip, such as rocks or tools.  Regularly check to see if handrails are loose or broken. Make sure that both sides of any steps have handrails.  Any raised decks and porches should have guardrails on the edges.  Have any leaves, snow, or ice cleared regularly.  Use sand or salt on walking paths during winter.  Clean up any spills in your garage right away. This includes oil or grease spills. What can I do in the bathroom?  Use night lights.  Install grab bars by the toilet and in the tub and shower. Do not use towel bars as grab bars.  Use non-skid mats or decals in the tub or shower.  If you need to sit down in the shower, use a plastic, non-slip stool.  Keep the floor dry. Clean up any water that spills on the floor as soon as it happens.  Remove soap buildup in the tub or shower regularly.  Attach bath mats securely with  double-sided non-slip rug tape.  Do not have throw rugs and other things on the floor that can make you trip. What can I do in the bedroom?  Use night lights.  Make sure that you have a light by your bed that is easy to reach.  Do not use any sheets or blankets that are too big for your bed. They should not hang down onto the floor.  Have a firm chair that has side arms. You can use this for support while you get dressed.  Do not have throw rugs and other things on the floor that can make you trip. What can I do in the kitchen?  Clean up any spills right away.  Avoid walking on wet floors.  Keep items that you use a lot in easy-to-reach places.  If you need to reach something above you, use a strong step stool that has a grab bar.  Keep electrical cords out of the way.  Do not use floor polish or wax that makes floors slippery. If you must use wax, use non-skid floor wax.  Do not have throw rugs and  other things on the floor that can make you trip. What can I do with my stairs?  Do not leave any items on the stairs.  Make sure that there are handrails on both sides of the stairs and use them. Fix handrails that are broken or loose. Make sure that handrails are as long as the stairways.  Check any carpeting to make sure that it is firmly attached to the stairs. Fix any carpet that is loose or worn.  Avoid having throw rugs at the top or bottom of the stairs. If you do have throw rugs, attach them to the floor with carpet tape.  Make sure that you have a light switch at the top of the stairs and the bottom of the stairs. If you do not have them, ask someone to add them for you. What else can I do to help prevent falls?  Wear shoes that:  Do not have high heels.  Have rubber bottoms.  Are comfortable and fit you well.  Are closed at the toe. Do not wear sandals.  If you use a stepladder:  Make sure that it is fully opened. Do not climb a closed stepladder.  Make  sure that both sides of the stepladder are locked into place.  Ask someone to hold it for you, if possible.  Clearly mark and make sure that you can see:  Any grab bars or handrails.  First and last steps.  Where the edge of each step is.  Use tools that help you move around (mobility aids) if they are needed. These include:  Canes.  Walkers.  Scooters.  Crutches.  Turn on the lights when you go into a dark area. Replace any light bulbs as soon as they burn out.  Set up your furniture so you have a clear path. Avoid moving your furniture around.  If any of your floors are uneven, fix them.  If there are any pets around you, be aware of where they are.  Review your medicines with your doctor. Some medicines can make you feel dizzy. This can increase your chance of falling. Ask your doctor what other things that you can do to help prevent falls. This information is not intended to replace advice given to you by your health care provider. Make sure you discuss any questions you have with your health care provider. Document Released: 01/20/2009 Document Revised: 09/01/2015 Document Reviewed: 04/30/2014 Elsevier Interactive Patient Education  2017 [employer].

## 2017-03-28 LAB — LIPID PANEL
Cholesterol: 168 mg/dL (ref <200)
HDL: 65 mg/dL (ref >50)
LDL Cholesterol (Calc): 82 mg/dL (calc)
Non-HDL Cholesterol (Calc): 103 mg/dL (calc) (ref <130)
Total CHOL/HDL Ratio: 2.6 (calc) (ref <5.0)
Triglycerides: 118 mg/dL (ref <150)

## 2017-03-28 LAB — COMPLETE METABOLIC PANEL WITH GFR
AG Ratio: 1.8 (calc) (ref 1.0–2.5)
ALT: 32 U/L — ABNORMAL HIGH (ref 6–29)
AST: 24 U/L (ref 10–35)
Albumin: 4.2 g/dL (ref 3.6–5.1)
Alkaline phosphatase (APISO): 60 U/L (ref 33–130)
BUN/Creatinine Ratio: 35 (calc) — ABNORMAL HIGH (ref 6–22)
BUN: 28 mg/dL — ABNORMAL HIGH (ref 7–25)
CO2: 31 mmol/L (ref 20–32)
Calcium: 10.2 mg/dL (ref 8.6–10.4)
Chloride: 106 mmol/L (ref 98–110)
Creat: 0.81 mg/dL (ref 0.60–0.88)
GFR, Est African American: 79 mL/min/{1.73_m2} (ref >=60)
GFR, Est Non African American: 68 mL/min/{1.73_m2} (ref >=60)
Globulin: 2.3 g/dL (calc) (ref 1.9–3.7)
Glucose, Bld: 71 mg/dL (ref 65–139)
Potassium: 4.2 mmol/L (ref 3.5–5.3)
Sodium: 141 mmol/L (ref 135–146)
Total Bilirubin: 0.2 mg/dL (ref 0.2–1.2)
Total Protein: 6.5 g/dL (ref 6.1–8.1)

## 2017-03-28 LAB — URINALYSIS, ROUTINE W REFLEX MICROSCOPIC
Bacteria, UA: NONE SEEN /HPF
Bilirubin Urine: NEGATIVE
Glucose, UA: NEGATIVE
Hgb urine dipstick: NEGATIVE
Hyaline Cast: NONE SEEN /LPF
Ketones, ur: NEGATIVE
Nitrite: NEGATIVE
Protein, ur: NEGATIVE
Specific Gravity, Urine: 1.023 (ref 1.001–1.03)
Squamous Epithelial / LPF: NONE SEEN /HPF (ref <=5)
WBC, UA: NONE SEEN /HPF (ref 0–5)
pH: 5.5 (ref 5.0–8.0)

## 2017-03-28 LAB — TSH: TSH: 2.04 mIU/L (ref 0.40–4.50)

## 2017-03-28 LAB — VITAMIN D 25 HYDROXY (VIT D DEFICIENCY, FRACTURES): Vit D, 25-Hydroxy: 44 ng/mL (ref 30–100)

## 2017-04-06 ENCOUNTER — Other Ambulatory Visit: Payer: Self-pay | Admitting: Internal Medicine

## 2017-04-15 ENCOUNTER — Ambulatory Visit (INDEPENDENT_AMBULATORY_CARE_PROVIDER_SITE_OTHER): Payer: Medicare Other | Admitting: Podiatry

## 2017-04-15 ENCOUNTER — Telehealth: Payer: Self-pay | Admitting: *Deleted

## 2017-04-15 ENCOUNTER — Encounter: Payer: Self-pay | Admitting: Podiatry

## 2017-04-15 ENCOUNTER — Other Ambulatory Visit: Payer: Self-pay | Admitting: Podiatry

## 2017-04-15 ENCOUNTER — Ambulatory Visit (INDEPENDENT_AMBULATORY_CARE_PROVIDER_SITE_OTHER): Payer: Medicare Other

## 2017-04-15 DIAGNOSIS — M79671 Pain in right foot: Secondary | ICD-10-CM

## 2017-04-15 DIAGNOSIS — I999 Unspecified disorder of circulatory system: Secondary | ICD-10-CM | POA: Diagnosis not present

## 2017-04-15 DIAGNOSIS — L84 Corns and callosities: Secondary | ICD-10-CM | POA: Diagnosis not present

## 2017-04-15 DIAGNOSIS — M79672 Pain in left foot: Secondary | ICD-10-CM

## 2017-04-15 DIAGNOSIS — M2041 Other hammer toe(s) (acquired), right foot: Secondary | ICD-10-CM | POA: Diagnosis not present

## 2017-04-15 DIAGNOSIS — M722 Plantar fascial fibromatosis: Secondary | ICD-10-CM

## 2017-04-15 DIAGNOSIS — M2042 Other hammer toe(s) (acquired), left foot: Secondary | ICD-10-CM

## 2017-04-15 DIAGNOSIS — R0989 Other specified symptoms and signs involving the circulatory and respiratory systems: Secondary | ICD-10-CM

## 2017-04-15 MED ORDER — TRIAMCINOLONE ACETONIDE 10 MG/ML IJ SUSP
10.0000 mg | Freq: Once | INTRAMUSCULAR | Status: AC
  Administered 2017-04-15: 10 mg

## 2017-04-15 NOTE — Telephone Encounter (Signed)
Left message informing Alician - [facility] of referral to [facility] cardiovascular physician for in-office abnormal ABI, that I would fax referral, clinicals and demographics to their office and to please contact me with pt's appt information.

## 2017-04-15 NOTE — Patient Instructions (Signed)

## 2017-04-15 NOTE — Progress Notes (Signed)
Subjective:   Patient ID: [name], female   DOB: 84 y.o.   MRN: [id number]   HPI Patient presents stating she is having a lot of pain in the heel region left over right and also complains of achiness in her left leg and cramps if she does a lot of walking.  Patient does not smoke and likes to be active   Review of Systems  All other systems reviewed and are negative.       Objective:  Physical Exam  Constitutional: She appears well-developed and well-nourished.  Cardiovascular: Intact distal pulses.  Pulmonary/Chest: Effort normal.  Musculoskeletal: Normal range of motion.  Neurological: She is alert.  Skin: Skin is warm.  Nursing note and vitals reviewed.   Neurovascular status was found to be diminished left over right with diminishment of PT and DP pulses which on the left were not palpable and on the right with mildly diminished.  Patient is noted to have tenderness in the heel region bilateral with left being worse than the right and does have claudication symptoms when questioned left over right     Assessment:  Acute plantar fasciitis left over right with also possibility for vascular disease left over right     Plan:  H&P condition and x-rays reviewed with patient.  Careful injection administered 3 mg dexamethasone Kenalog 5 mg Xylocaine applied fascial band left to lift the arch up and I did do an ABI indicating most likely that there is vascular disease on the left side and we are sending for referral to vascular doctor to be evaluated with possibility that there may be some intervention necessary  X-rays indicate small spur with no indication of stress fracture or arthritis

## 2017-04-15 NOTE — Progress Notes (Signed)
   Subjective:    Patient ID: [name], female    DOB: Sep 09, 1933, 84 y.o.   MRN: [id number]  HPI    Review of Systems  All other systems reviewed and are negative.      Objective:   Physical Exam        Assessment & Plan:

## 2017-04-15 NOTE — Telephone Encounter (Signed)
Alician - [facility] states pt is scheduled with Dr. Bridgett Larsson at 1:00pm on 04/19/2017, and she will contact pt with the appt time.

## 2017-04-16 NOTE — Telephone Encounter (Signed)
Faxed referral, demographics and clinicals to [facility].

## 2017-04-19 ENCOUNTER — Encounter: Payer: Medicare Other | Admitting: Vascular Surgery

## 2017-04-22 ENCOUNTER — Ambulatory Visit (HOSPITAL_COMMUNITY)
Admission: EM | Admit: 2017-04-22 | Discharge: 2017-04-22 | Disposition: A | Discharge status: Home / Self Care | Payer: Medicare Other | Attending: Urgent Care | Admitting: Urgent Care

## 2017-04-22 ENCOUNTER — Other Ambulatory Visit: Payer: Self-pay

## 2017-04-22 ENCOUNTER — Encounter (HOSPITAL_COMMUNITY): Payer: Self-pay | Admitting: Emergency Medicine

## 2017-04-22 ENCOUNTER — Ambulatory Visit (INDEPENDENT_AMBULATORY_CARE_PROVIDER_SITE_OTHER): Payer: Medicare Other

## 2017-04-22 DIAGNOSIS — R350 Frequency of micturition: Secondary | ICD-10-CM

## 2017-04-22 DIAGNOSIS — R1084 Generalized abdominal pain: Secondary | ICD-10-CM

## 2017-04-22 DIAGNOSIS — K5900 Constipation, unspecified: Secondary | ICD-10-CM

## 2017-04-22 DIAGNOSIS — R630 Anorexia: Secondary | ICD-10-CM

## 2017-04-22 MED ORDER — DOCUSATE SODIUM 50 MG PO CAPS: 50.0000 mg | ORAL_CAPSULE | Freq: Two times a day (BID) | ORAL | 0 refills | Status: DC

## 2017-04-22 MED ORDER — POLYETHYLENE GLYCOL 3350 17 G PO PACK: 17.0000 g | PACK | Freq: Every day | ORAL | 0 refills | Status: DC | PRN

## 2017-04-22 NOTE — ED Notes (Signed)
Patient sitting on toilet trying to obtain a urine specimen

## 2017-04-22 NOTE — ED Provider Notes (Signed)
  MRN: [id number] DOB: 06/18/1933  Subjective:   [name] is a 84 y.o. female presenting for 2 day history of constant achy with intermittent throbbing belly pain. Has also had dizziness, urinary frequency (~1 episode every hour). Last BM was Saturday. Has decreased appetite but is tolerating fluids. Does not hydrate well, states that she maybe drinks one 8 ounce cup of water daily. She is passing gas. Denies history of difficulty with constipation. Has tried Tylenol with some relief. Denies fever, confusion, chest pain, nausea, vomiting, diarrhea, bloody stools, dysuria, hematuria. Denies smoking cigarettes.  [name] is allergic to iodine; shellfish allergy; aspirin; and codeine.  [name]  has a past medical history of Arthritis, Cancer ([place]), CHF (congestive heart failure) ([place]), Complication of anesthesia, Depression, Dyslipidemia, Fainted (04/21/06), GERD (gastroesophageal reflux disease), Headache(784.0), Hearing loss, HLD (hyperlipidemia), Hypertension, Hypothyroidism, ICD (implantable cardiac defibrillator) in place, ICD (implantable cardiac defibrillator), biventricular, in situ, LBBB (left bundle branch block), Memory loss, Nonischemic cardiomyopathy ([place]), Normal coronary arteries, Pacemaker, Syncope, Systolic CHF ([place]), Vertigo, and Wears glasses. Also  has a past surgical history that includes Total knee arthroplasty (05/17/01); Lumbar fusion (2006); Mastectomy, partial (2008); Breast surgery (2000); Joint replacement (06/14/01); Pacemaker insertion (04/23/06); Back surgery; Cardiac catheterization; Mass excision (11/08/2011); implantable cardioverter defibrillator generator change (N/A, 12/18/2012); Cataract extraction; Eye surgery; and Total knee arthroplasty (Left, 11/29/2014). Her family history includes Arthritis in her mother; Hypertension in her brother, brother, father, and mother.   Objective:   Vitals: BP 127/87 (BP Location: Left Arm)   Pulse 96   Temp 97.9 F (36.6 C) (Oral)   Resp (!)  22   SpO2 96%   Physical Exam  Constitutional: She is oriented to person, place, and time. She appears well-developed and well-nourished.  HENT:  Mouth/Throat: Oropharynx is clear and moist.  Eyes: No scleral icterus.  Cardiovascular: Normal rate, regular rhythm and intact distal pulses. Exam reveals no gallop and no friction rub.  No murmur heard. Pulmonary/Chest: No respiratory distress. She has no wheezes. She has no rales.  Abdominal: Soft. Bowel sounds are normal. She exhibits no distension and no mass. There is no tenderness. There is no rebound, no guarding and no CVA tenderness.  Musculoskeletal: She exhibits no edema.  Neurological: She is alert and oriented to person, place, and time.  Skin: Skin is warm and dry. No rash noted. No erythema. No pallor.  Psychiatric: She has a normal mood and affect.   Dg Abd 1 View  Result Date: 04/22/2017 CLINICAL DATA:  Abdominal pain EXAM: ABDOMEN - 1 VIEW COMPARISON:  CT abdomen and pelvis October 05, 2015 FINDINGS: There is moderate stool in the colon. There is no bowel dilatation or air-fluid level to suggest bowel obstruction. No free air. There is postoperative change in the lower lumbar spine. IMPRESSION: No bowel obstruction or free air.  Moderate stool in colon. Electronically Signed   By: Lowella Grip III M.D.   On: 04/22/2017 12:14   Assessment and Plan :   Generalized abdominal pain  Constipation, unspecified constipation type  Decreased appetite  Urinary frequency  Patient unable to provide urine sample after 2 hours, 3 cups of water. Will manage for constipation, counseled on signs and symptoms of UTI. Return-to-clinic precautions discussed, patient verbalized understanding.    Stacey Eagles, PA-C 04/22/17 5784

## 2017-04-22 NOTE — ED Triage Notes (Addendum)
Abdominal pain started Saturday.  Denies nausea or vomiting.  Patient reports a normal bm on Saturday.  No diarrhea .  Patient has been staying in the hospital with spouse for seven days.  Patient stayed the entire time with spouse.  Urinating more often

## 2017-04-22 NOTE — Discharge Instructions (Signed)
Please use Miralax for moderate to severe constipation. Take this once a day for the next 2-3 days. Please also start Colace (docusate) stool softener, twice a day for at least 1 week. If stools become loose, cut down to once a day for another week. If stools remain loose, cut back to 1 pill every other day for a third week. You can stop docusate thereafter and resume as needed for constipation.  To help reduce constipation and promote bowel health: 1. Drink at least 64 ounces of water each day 2. Eat plenty of fiber (fruits, vegetables, whole grains, legumes) 3. Be physically active or exercise including walking, jogging, swimming, yoga, etc. 4. For active constipation use a stool softener (docusate) or an osmotic laxative (like Miralax) each day, or as needed.

## 2017-04-24 ENCOUNTER — Telehealth: Payer: Self-pay | Admitting: *Deleted

## 2017-04-24 NOTE — Telephone Encounter (Signed)
Patient called and left message on Clinical Intake yesterday at 4:50pm stating she was in the ER on Monday for a stomach ache and she needs to know what she can do for strength due to feeling weak.   I called patient back and had to LM and informed her to call office back to schedule an appointment to follow up from the ER. Told her to call, as of right now we could get her in today.

## 2017-04-29 ENCOUNTER — Encounter: Payer: Self-pay | Admitting: Podiatry

## 2017-04-29 ENCOUNTER — Ambulatory Visit (INDEPENDENT_AMBULATORY_CARE_PROVIDER_SITE_OTHER): Payer: Medicare Other | Admitting: Nurse Practitioner

## 2017-04-29 ENCOUNTER — Encounter: Payer: Self-pay | Admitting: Nurse Practitioner

## 2017-04-29 ENCOUNTER — Ambulatory Visit (INDEPENDENT_AMBULATORY_CARE_PROVIDER_SITE_OTHER): Payer: Medicare Other | Admitting: Podiatry

## 2017-04-29 VITALS — BP 122/72 | HR 83 | Temp 98.5°F | Ht 60.0 in | Wt 125.0 lb

## 2017-04-29 DIAGNOSIS — I999 Unspecified disorder of circulatory system: Secondary | ICD-10-CM | POA: Diagnosis not present

## 2017-04-29 DIAGNOSIS — R634 Abnormal weight loss: Secondary | ICD-10-CM | POA: Diagnosis not present

## 2017-04-29 DIAGNOSIS — M722 Plantar fascial fibromatosis: Secondary | ICD-10-CM | POA: Diagnosis not present

## 2017-04-29 DIAGNOSIS — R109 Unspecified abdominal pain: Secondary | ICD-10-CM | POA: Diagnosis not present

## 2017-04-29 DIAGNOSIS — K5900 Constipation, unspecified: Secondary | ICD-10-CM | POA: Diagnosis not present

## 2017-04-29 NOTE — Progress Notes (Signed)
Careteam: Patient Care Team: [name], DO as PCP - General (Internal Medicine) [name], MD as Consulting Physician (General Surgery) Paralee Cancel, MD as Consulting Physician (Orthopedic Surgery) Marica Otter, Arnold Line (Optometry) Marcial Pacas, MD as Consulting Physician (Neurology) [name], MD as Consulting Physician (Cardiology)  Advanced Directive information    Allergies  Allergen Reactions  . Iodine Shortness Of Breath    Iodine contrast, CHF , SOB  . Shellfish Allergy Shortness Of Breath  . Aspirin Nausea And Vomiting  . Codeine Nausea And Vomiting    Chief Complaint  Patient presents with  . Follow-up    Pt is being seen for an ED follow up. Pt was seen at [facility] ED due to abdominal pains on 04/22/17. Pt reports that she is no longer having the pain.      HPI: Patient is a 84 y.o. female seen in the office today for urgent care follow up due to belly pains.  Diagnosised with constipation and directed to take colace and Miralax daily Has been taking colace but Miralax has been giving her cramps.  Bowels are moving daily at this time. Still with occasional straining.  Has been trying to increase water intake.  No dysuria.  Having urinary frequency due to increase in take.  No longer having abdominal pain and eating better.  Still feeling weak- weight was down to 120 lbs but now back up to 125lb today Eating 3 meals a day.  Using ensure daily   Review of Systems:  Review of Systems  Constitutional: Negative for chills, fever and weight loss.  HENT: Negative for tinnitus.   Respiratory: Negative for cough, sputum production and shortness of breath.   Cardiovascular: Negative for chest pain, palpitations and leg swelling.  Gastrointestinal: Negative for abdominal pain, constipation, diarrhea and heartburn.       States she does not have constipation but strains with bowel movement   Genitourinary: Negative for dysuria, frequency and urgency.    Musculoskeletal: Negative for back pain, joint pain and myalgias.  Skin: Negative.   Neurological: Positive for weakness. Negative for dizziness and headaches.  Psychiatric/Behavioral: Positive for memory loss. Negative for depression. The patient does not have insomnia.     Past Medical History:  Diagnosis Date  . Arthritis   . Cancer [facility])    left breast cancer   . CHF (congestive heart failure) ([place])    PACEMAKER & DEFIB  . Complication of anesthesia    hypotensive after back surgery in 2006- reports on chart   . Depression   . Dyslipidemia   . Fainted 04/21/06   AT [place]  . GERD (gastroesophageal reflux disease)   . Headache(784.0)   . Hearing loss   . HLD (hyperlipidemia)   . Hypertension   . Hypothyroidism   . ICD (implantable cardiac defibrillator) in place    pt has pacer/icd  . ICD (implantable cardiac defibrillator), biventricular, in situ   . LBBB (left bundle branch block)   . Memory loss   . Nonischemic cardiomyopathy ([place])   . Normal coronary arteries    s/p cardiac cath 2007  . Pacemaker    Guidant Device  . Syncope   . Systolic CHF ([place])   . Vertigo   . Wears glasses    Past Surgical History:  Procedure Laterality Date  . BACK SURGERY     lumbar fusion   . BREAST SURGERY  2000   LUMP REMOVAL. STAGE 1 CANCER  . CARDIAC CATHETERIZATION    .  CATARACT EXTRACTION    . EYE SURGERY    . IMPLANTABLE CARDIOVERTER DEFIBRILLATOR GENERATOR CHANGE N/A 12/18/2012   Procedure: IMPLANTABLE CARDIOVERTER DEFIBRILLATOR GENERATOR CHANGE;  Surgeon: [name], MD;  Location: [facility] CATH LAB;  Service: Cardiovascular;  Laterality: N/A;  . JOINT REPLACEMENT  06/14/01   right  . LUMBAR FUSION  2006  . MASS EXCISION  11/08/2011   Procedure: EXCISION MASS;  Surgeon: [name], MD;  Location: [facility] ORS;  Service: General;  Laterality: Left;  Excision Left Thigh Mass  . MASTECTOMY, PARTIAL  2008   GOT PACEMAKER AND DEFIB AT THAT TIME  . PACEMAKER INSERTION  04/23/06  .  TOTAL KNEE ARTHROPLASTY  05/17/01   RIGHT KNEE  . TOTAL KNEE ARTHROPLASTY Left 11/29/2014   Procedure: TOTAL LEFT KNEE ARTHROPLASTY;  Surgeon: Paralee Cancel, MD;  Location: [facility] ORS;  Service: Orthopedics;  Laterality: Left;   Social History:   reports that  has never smoked. she has never used smokeless tobacco. She reports that she does not drink alcohol or use drugs.  Family History  Problem Relation Age of Onset  . Hypertension Mother   . Arthritis Mother   . Hypertension Father   . Hypertension Brother   . Hypertension Brother     Medications: Patient's Medications  New Prescriptions   No medications on file  Previous Medications   ACETAMINOPHEN (TYLENOL) 325 MG TABLET    Take 2 tablets (650 mg total) by mouth every 6 (six) hours as needed for mild pain (or Fever >/= 101).   BUPROPION (WELLBUTRIN XL) 150 MG 24 HR TABLET    Take 3 tablets (450 mg total) by mouth daily.   CHOLECALCIFEROL (VITAMIN D) 1000 UNITS TABLET    Take 1,000 Units by mouth daily.   DOCUSATE SODIUM (COLACE) 50 MG CAPSULE    Take 1 capsule (50 mg total) by mouth 2 (two) times daily.   FUROSEMIDE (LASIX) 40 MG TABLET    Take one (1) tablet (40 mg total) by mouth every morning and and half (1/2) tablet (20 mg total) by mouth every evening.   LEVOTHYROXINE (SYNTHROID, LEVOTHROID) 100 MCG TABLET    Take 1 tablet (100 mcg total) by mouth daily before breakfast.   MAGNESIUM 250 MG TABS    Take 250 mg by mouth daily.   MEMANTINE (NAMENDA) 5 MG TABLET    Week 1 5mg  daily then week 2 1 tab twice daily, then week 3 1 am 2 tabs pm, then 2 tabs twice daily   METOPROLOL TARTRATE (LOPRESSOR) 25 MG TABLET    TAKE 1 TABLET BY MOUTH  EVERY MORNING AND ONE-HALF  TABLET EVERY EVENING   OLMESARTAN (BENICAR) 40 MG TABLET    TAKE 1 TABLET BY MOUTH  EVERY MORNING FOR BLOOD  PRESSURE   OMEGA-3 FATTY ACIDS (FISH OIL) 1200 MG CAPS    Take 1,200 mg by mouth every morning.   OMEPRAZOLE (PRILOSEC) 40 MG CAPSULE    TAKE 1 CAPSULE BY MOUTH  DAILY  FOR STOMACH   OXCARBAZEPINE (TRILEPTAL) 300 MG TABLET    Take 300 mg by mouth every morning.    POLYETHYLENE GLYCOL (MIRALAX / GLYCOLAX) PACKET    Take 17 g by mouth daily as needed.   POTASSIUM CHLORIDE (K-DUR,KLOR-CON) 10 MEQ TABLET    TAKE 1 TABLET BY MOUTH  DAILY   SERTRALINE (ZOLOFT) 100 MG TABLET    Take 200 mg by mouth every morning.    SPIRONOLACTONE (ALDACTONE) 25 MG TABLET  TAKE 1 TABLET BY MOUTH  EVERY MORNING   TIZANIDINE (ZANAFLEX) 4 MG TABLET    TAKE 1 TABLET EVERY 6 HOURS AS NEEDED FOR MUSCLE SPASM.   VITAMIN E 1000 UNIT CAPSULE    Take 1,000 Units by mouth daily.  Modified Medications   No medications on file  Discontinued Medications   No medications on file     Physical Exam:  Vitals:   04/29/17 1036  BP: 122/72  Pulse: 83  Temp: 98.5 F (36.9 C)  TempSrc: Oral  SpO2: 96%  Weight: 125 lb (56.7 kg)  Height: 5' (1.524 m)   Body mass index is 24.41 kg/m.  Physical Exam  Constitutional: She appears well-developed and well-nourished.  HENT:  Head: Normocephalic and atraumatic.  Nose: Nose normal.  Mouth/Throat: Oropharynx is clear and moist. No oropharyngeal exudate.  MMM; no oral thrush  Eyes: Pupils are equal, round, and reactive to light.  Neck: Neck supple. Carotid bruit is not present.  Cardiovascular: Normal rate, regular rhythm and normal heart sounds. Exam reveals no gallop and no friction rub.  No murmur heard. Pulmonary/Chest: Effort normal and breath sounds normal.  Abdominal: Soft. Normal appearance and bowel sounds are normal. She exhibits no distension and no mass. There is no hepatomegaly. There is no tenderness. There is no rigidity, no rebound and no guarding. No hernia.  Musculoskeletal: She exhibits no edema.  Neurological: She is alert. She has normal reflexes.  Skin: Skin is warm and dry. No rash noted.  Psychiatric: She has a normal mood and affect. Her behavior is normal. Judgment and thought content normal.    Labs  reviewed: Basic Metabolic Panel: Recent Labs    08/31/16 1050 12/07/16 1215 03/27/17 1534  NA 141 143 141  K 4.5 5.0 4.2  CL 107 107 106  CO2 27 24 31   GLUCOSE 88 73 71  BUN 31* 28* 28*  CREATININE 1.00* 1.21* 0.81  CALCIUM 10.2 9.9 10.2  TSH 2.31 1.09 2.04   Liver Function Tests: Recent Labs    08/31/16 1050 12/07/16 1215 03/27/17 1534  AST 23 23 24   ALT 27 26 32*  ALKPHOS 67 66  --   BILITOT 0.3 0.2 0.2  PROT 6.7 6.7 6.5  ALBUMIN 3.9 4.1  --    No results for input(s): LIPASE, AMYLASE in the last 8760 hours. No results for input(s): AMMONIA in the last 8760 hours. CBC: Recent Labs    08/31/16 1050  WBC 9.2  NEUTROABS 6,532  HGB 12.2  HCT 38.7  MCV 88.2  PLT 132*   Lipid Panel: Recent Labs    03/27/17 1534  CHOL 168  HDL 65  TRIG 118  CHOLHDL 2.6   TSH: Recent Labs    08/31/16 1050 12/07/16 1215 03/27/17 1534  TSH 2.31 1.09 2.04   A1C: No results found for: HGBA1C   Assessment/Plan 1. Constipation, unspecified constipation type -encouraged to cont colace, increase water intake and may use dulcolax tablet daily    2. Weight loss Weight is up at this time. Reports appetite has improved. Continues to eat better and take ensure daily.   3. Abdominal pain, unspecified abdominal location Improved, reports occasional cramps when she takes miralax. Told her to hold this and use dulcolax as needed   Next appt: to follow up with Dr Eulas Post in May for 5 month follow up.  Carlos American. Harle Battiest  [facility] [phone] 8 am - 5 pm) [phone] (after hours)

## 2017-04-29 NOTE — Patient Instructions (Addendum)
Cont to eat 3 meals a day Keep hydrated with water Continue ensure daily

## 2017-05-01 NOTE — Progress Notes (Signed)
Subjective:   Patient ID: [name], female   DOB: 84 y.o.   MRN: [id number]   HPI Patient's heel is some improved but I do think vascular disease is a part of her condition and she is waiting to see the doctor and we have put a request in for her to see a doctor to have circulation evaluated   ROS      Objective:  Physical Exam  Diminishment vascular status neurological intact with discomfort plantar heel still present     Assessment:  Reviewed condition consisting of fasciitis with vascular disease most likely present     Plan:  At this point we tried to put the approval in again and I gave strict instructions if she does not hear from the vascular doctor to contact us in the next week or 2 and we will again try to make contact with them.  At this point I do want her to be evaluated and I gave her strict instructions if she developed any ulcerations or other changes to go straight through the emergency room and upon questioning she is got mild to moderate claudication but no indications of acute process

## 2017-05-08 ENCOUNTER — Ambulatory Visit (INDEPENDENT_AMBULATORY_CARE_PROVIDER_SITE_OTHER): Payer: Medicare Other | Admitting: *Deleted

## 2017-05-08 DIAGNOSIS — I428 Other cardiomyopathies: Secondary | ICD-10-CM | POA: Diagnosis not present

## 2017-05-08 NOTE — Progress Notes (Signed)
Remote ICD transmission.   

## 2017-05-09 ENCOUNTER — Encounter: Payer: Self-pay | Admitting: Cardiology

## 2017-05-15 ENCOUNTER — Ambulatory Visit (INDEPENDENT_AMBULATORY_CARE_PROVIDER_SITE_OTHER): Payer: Medicare Other | Admitting: Vascular Surgery

## 2017-05-15 ENCOUNTER — Other Ambulatory Visit: Payer: Self-pay

## 2017-05-15 ENCOUNTER — Encounter: Payer: Self-pay | Admitting: Vascular Surgery

## 2017-05-15 DIAGNOSIS — I70223 Atherosclerosis of native arteries of extremities with rest pain, bilateral legs: Secondary | ICD-10-CM

## 2017-05-15 DIAGNOSIS — I70219 Atherosclerosis of native arteries of extremities with intermittent claudication, unspecified extremity: Secondary | ICD-10-CM | POA: Insufficient documentation

## 2017-05-15 NOTE — Progress Notes (Signed)
Requested by:  [name], DO [place],  [id number]  Reason for consultation: bilateral leg pain   History of Present Illness   [name] is a 84 y.o. (1933-08-14) female who presents with chief complaint: pain in feet and distal calves waking her up.  Onset of symptoms occurred one year ago without obvious trigger.  Pain is described as burning in feet and calves, L>R, severity 1-5/10, and associated with rest.  Patient has attempted to treat this pain with hanging feet over edge.  This patient also has some sx with ambulation.  The patient has rest pain symptoms.  The patient has no leg wounds/ulcers.  Patient has known history of neuropathic sx in hands and feet and also back pain.  Patient was referred by Podiatry with an abnormal screening study for PAD.  Atherosclerotic risk factors include: HLD.  Past Medical History:  Diagnosis Date  . Arthritis   . Cancer [facility])    left breast cancer   . CHF (congestive heart failure) ([place])    PACEMAKER & DEFIB  . Complication of anesthesia    hypotensive after back surgery in 2006- reports on chart   . Depression   . Dyslipidemia   . Fainted 04/21/06   AT [place]  . GERD (gastroesophageal reflux disease)   . Headache(784.0)   . Hearing loss   . HLD (hyperlipidemia)   . Hypertension   . Hypothyroidism   . ICD (implantable cardiac defibrillator) in place    pt has pacer/icd  . ICD (implantable cardiac defibrillator), biventricular, in situ   . LBBB (left bundle branch block)   . Memory loss   . Nonischemic cardiomyopathy ([place])   . Normal coronary arteries    s/p cardiac cath 2007  . Pacemaker    Guidant Device  . Syncope   . Systolic CHF ([place])   . Vertigo   . Wears glasses     Past Surgical History:  Procedure Laterality Date  . BACK SURGERY     lumbar fusion   . BREAST SURGERY  2000   LUMP REMOVAL. STAGE 1 CANCER  . CARDIAC CATHETERIZATION    . CATARACT EXTRACTION    . EYE SURGERY    .  IMPLANTABLE CARDIOVERTER DEFIBRILLATOR GENERATOR CHANGE N/A 12/18/2012   Procedure: IMPLANTABLE CARDIOVERTER DEFIBRILLATOR GENERATOR CHANGE;  Surgeon: [name], MD;  Location: [facility] CATH LAB;  Service: Cardiovascular;  Laterality: N/A;  . JOINT REPLACEMENT  06/14/01   right  . LUMBAR FUSION  2006  . MASS EXCISION  11/08/2011   Procedure: EXCISION MASS;  Surgeon: [name], MD;  Location: [facility] ORS;  Service: General;  Laterality: Left;  Excision Left Thigh Mass  . MASTECTOMY, PARTIAL  2008   GOT PACEMAKER AND DEFIB AT THAT TIME  . PACEMAKER INSERTION  04/23/06  . TOTAL KNEE ARTHROPLASTY  05/17/01   RIGHT KNEE  . TOTAL KNEE ARTHROPLASTY Left 11/29/2014   Procedure: TOTAL LEFT KNEE ARTHROPLASTY;  Surgeon: Paralee Cancel, MD;  Location: [facility] ORS;  Service: Orthopedics;  Laterality: Left;    Social History   Socioeconomic History  . Marital status: Married    Spouse name: Not on file  . Number of children: 1  . Years of education: Masters  . Highest education level: Not on file  Social Needs  . Financial resource strain: Not hard at all  . Food insecurity - worry: Never true  . Food insecurity - inability: Never true  . Transportation needs -  medical: No  . Transportation needs - non-medical: No  Occupational History  . Occupation: Retired  Tobacco Use  . Smoking status: Never Smoker  . Smokeless tobacco: Never Used  Substance and Sexual Activity  . Alcohol use: No  . Drug use: No  . Sexual activity: Not Currently    Birth control/protection: Post-menopausal  Other Topics Concern  . Not on file  Social History Narrative   Lives at home with husband.   Right-handed.      As of 07/28/2014   Diet: No special diet   Caffeine: yes, Chocolate, tea and sodas    Married: YES, 1970   House: Yes, 2 stories, 2-3 persons live in home   Pets: No   Current/Past profession: Engineer, mining, Designer, jewellery    Exercise: Yes 2-3 x weekly   Living Will: Yes   DNR: No   POA/HPOA: No        Family History  Problem Relation Age of Onset  . Hypertension Mother   . Arthritis Mother   . Hypertension Father   . Hypertension Brother   . Hypertension Brother     Current Outpatient Medications  Medication Sig Dispense Refill  . acetaminophen (TYLENOL) 325 MG tablet Take 2 tablets (650 mg total) by mouth every 6 (six) hours as needed for mild pain (or Fever >/= 101).    Marland Kitchen buPROPion (WELLBUTRIN XL) 150 MG 24 hr tablet Take 3 tablets (450 mg total) by mouth daily. 270 tablet 3  . cholecalciferol (VITAMIN D) 1000 UNITS tablet Take 1,000 Units by mouth daily.    Marland Kitchen docusate sodium (COLACE) 50 MG capsule Take 1 capsule (50 mg total) by mouth 2 (two) times daily. 60 capsule 0  . furosemide (LASIX) 40 MG tablet Take one (1) tablet (40 mg total) by mouth every morning and and half (1/2) tablet (20 mg total) by mouth every evening. 135 tablet 3  . levothyroxine (SYNTHROID, LEVOTHROID) 100 MCG tablet Take 1 tablet (100 mcg total) by mouth daily before breakfast. 90 tablet 3  . Magnesium 250 MG TABS Take 250 mg by mouth daily.    . metoprolol tartrate (LOPRESSOR) 25 MG tablet TAKE 1 TABLET BY MOUTH  EVERY MORNING AND ONE-HALF  TABLET EVERY EVENING 135 tablet 1  . olmesartan (BENICAR) 40 MG tablet TAKE 1 TABLET BY MOUTH  EVERY MORNING FOR BLOOD  PRESSURE 90 tablet 1  . Omega-3 Fatty Acids (FISH OIL) 1200 MG CAPS Take 1,200 mg by mouth every morning.    Marland Kitchen omeprazole (PRILOSEC) 40 MG capsule TAKE 1 CAPSULE BY MOUTH  DAILY FOR STOMACH 90 capsule 1  . Oxcarbazepine (TRILEPTAL) 300 MG tablet Take 300 mg by mouth every morning.     . polyethylene glycol (MIRALAX / GLYCOLAX) packet Take 17 g by mouth daily as needed. 10 each 0  . potassium chloride (K-DUR,KLOR-CON) 10 MEQ tablet TAKE 1 TABLET BY MOUTH  DAILY 90 tablet 1  . sertraline (ZOLOFT) 100 MG tablet Take 200 mg by mouth every morning.     Marland Kitchen spironolactone (ALDACTONE) 25 MG tablet TAKE 1 TABLET BY MOUTH  EVERY MORNING 90 tablet 1  .  tiZANidine (ZANAFLEX) 4 MG tablet TAKE 1 TABLET EVERY 6 HOURS AS NEEDED FOR MUSCLE SPASM. 60 tablet 2  . vitamin E 1000 UNIT capsule Take 1,000 Units by mouth daily.    . memantine (NAMENDA) 5 MG tablet Week 1 5mg  daily then week 2 1 tab twice daily, then week 3 1 am 2 tabs  pm, then 2 tabs twice daily (Patient not taking: Reported on 05/15/2017) 60 tablet 6   No current facility-administered medications for this visit.     Allergies  Allergen Reactions  . Iodine Shortness Of Breath    Iodine contrast, CHF , SOB  . Shellfish Allergy Shortness Of Breath  . Aspirin Nausea And Vomiting  . Codeine Nausea And Vomiting    REVIEW OF SYSTEMS (negative unless checked):   Cardiac:  []  Chest pain or chest pressure? []  Shortness of breath upon activity? []  Shortness of breath when lying flat? []  Irregular heart rhythm?  Vascular:  [x]  Pain in calf, thigh, or hip brought on by walking? [x]  Pain in feet at night that wakes you up from your sleep? []  Blood clot in your veins? [x]  Leg swelling?  Pulmonary:  []  Oxygen at home? []  Productive cough? []  Wheezing?  Neurologic:  []  Sudden weakness in arms or legs? []  Sudden numbness in arms or legs? [x]  Sudden onset of difficult speaking or slurred speech? []  Temporary loss of vision in one eye? [x]  Problems with dizziness?  Gastrointestinal:  []  Blood in stool? []  Vomited blood?  Genitourinary:  []  Burning when urinating? []  Blood in urine?  Psychiatric:  [x]  Major depression  Hematologic:  []  Bleeding problems? []  Problems with blood clotting?  Dermatologic:  []  Rashes or ulcers?  Constitutional:  []  Fever or chills?  Ear/Nose/Throat:  []  Change in hearing? []  Nose bleeds? []  Sore throat?  Musculoskeletal:  []  Back pain? []  Joint pain? []  Muscle pain?   For VQI Use Only   PRE-ADM LIVING Home  AMB STATUS Ambulatory  CAD Sx None  PRIOR CHF None  STRESS TEST No    Physical Examination     Vitals:    05/15/17 1126  BP: 104/70  Pulse: 64  Resp: 14  Temp: (!) 96.9 F (36.1 C)  TempSrc: Oral  SpO2: 98%  Weight: 125 lb (56.7 kg)  Height: 4\' 11"  (1.499 m)   Body mass index is 25.25 kg/m.  General Alert, O x 3, WD, NAD  Head [place]/AT, Temporalis wasting  Ear/Nose/ Throat Hearing grossly intact, nares without erythema or drainage, oropharynx without Erythema or Exudate, Mallampati score: 3,   Eyes PERRLA, EOMI,    Neck Supple, mid-line trachea,    Pulmonary Sym exp, good B air movt, CTA B  Cardiac RRR, Nl S1, S2, no Murmurs, No rubs, No S3,S4  Vascular Vessel Right Left  Radial Palpable Palpable  Brachial Palpable Palpable  Carotid Palpable, No Bruit Palpable, No Bruit  Aorta Not palpable N/A  Femoral Palpable Palpable  Popliteal Not palpable Not palpable  PT Not palpable Not palpable  DP Faintly palpable Not palpable    Gastro- intestinal soft, non-distended, non-tender to palpation, No guarding or rebound, no HSM, no masses, no CVAT B, No palpable prominent aortic pulse,    Musculo- skeletal M/S 5/5 throughout  , Extremities without ischemic changes  , No edema present, Varicosities present: B small, No Lipodermatosclerosis present  Neurologic Cranial nerves 2-12 intact , Pain and light touch intact in extremities , Motor exam as listed above  Psychiatric Judgement intact, Mood & affect appropriate for pt's clinical situation  Dermatologic See M/S exam for extremity exam, No rashes otherwise noted  Lymphatic  Palpable lymph nodes: None    Medical Decision Making   AMOS GABER is a 84 y.o. female who presents with: Left PAD, possible rest pain   Patient describes rest pain, but pt has  had no tissue loss over a year time period.    In fact her "Normal" leg on screening testing also has these sx at nighttime.  Additionally, she has other possible etiologies for her pain: neuropathy vs spinal etiologies.  I would start with BLE ABI and RLE arterial duplex.  I  discussed in depth with the patient the nature of atherosclerosis, and emphasized the importance of maximal medical management including strict control of blood pressure, blood glucose, and lipid levels, antiplatelet agents, obtaining regular exercise, and cessation of smoking.  The patient is aware that without maximal medical management the underlying atherosclerotic disease process will progress, limiting the benefit of any interventions. The patient is currently not on on statin as not medically indicated.  The patient is currently on an anti-platelet: ASA.  Thank you for allowing [place] to participate in this patient's care.   Adele Barthel, MD, FACS [facility] Office: [phone] Pager: [phone]  05/15/2017, 11:53 AM

## 2017-05-16 ENCOUNTER — Ambulatory Visit (INDEPENDENT_AMBULATORY_CARE_PROVIDER_SITE_OTHER): Payer: Medicare Other | Admitting: Internal Medicine

## 2017-05-16 ENCOUNTER — Encounter: Payer: Self-pay | Admitting: Internal Medicine

## 2017-05-16 VITALS — BP 128/72 | HR 72 | Ht 59.0 in | Wt 127.0 lb

## 2017-05-16 DIAGNOSIS — Z95810 Presence of automatic (implantable) cardiac defibrillator: Secondary | ICD-10-CM | POA: Diagnosis not present

## 2017-05-16 DIAGNOSIS — I5022 Chronic systolic (congestive) heart failure: Secondary | ICD-10-CM

## 2017-05-16 DIAGNOSIS — I447 Left bundle-branch block, unspecified: Secondary | ICD-10-CM

## 2017-05-16 LAB — CUP PACEART INCLINIC DEVICE CHECK
Date Time Interrogation Session: 20190207050000
HighPow Impedance: 46 Ohm
Implantable Lead Implant Date: 20080116
Implantable Lead Implant Date: 20080116
Implantable Lead Implant Date: 20080116
Implantable Lead Location: 753859
Implantable Lead Location: 753860
Implantable Lead Location: 753860
Implantable Lead Model: 157
Implantable Lead Model: 4469
Implantable Lead Model: 4555
Implantable Lead Serial Number: 136532
Implantable Lead Serial Number: 161542
Implantable Lead Serial Number: 473495
Implantable Pulse Generator Implant Date: 20140911
Lead Channel Impedance Value: 392 Ohm
Lead Channel Impedance Value: 535 Ohm
Lead Channel Impedance Value: 764 Ohm
Lead Channel Pacing Threshold Amplitude: 0.6 V
Lead Channel Pacing Threshold Amplitude: 0.6 V
Lead Channel Pacing Threshold Amplitude: 0.9 V
Lead Channel Pacing Threshold Pulse Width: 0.4 ms
Lead Channel Pacing Threshold Pulse Width: 0.4 ms
Lead Channel Pacing Threshold Pulse Width: 0.8 ms
Lead Channel Sensing Intrinsic Amplitude: 17.7 mV
Lead Channel Sensing Intrinsic Amplitude: 25 mV
Lead Channel Sensing Intrinsic Amplitude: 5.8 mV
Lead Channel Setting Pacing Amplitude: 2 V
Lead Channel Setting Pacing Amplitude: 2 V
Lead Channel Setting Pacing Amplitude: 2.4 V
Lead Channel Setting Pacing Pulse Width: 0.4 ms
Lead Channel Setting Pacing Pulse Width: 0.8 ms
Lead Channel Setting Sensing Sensitivity: 0.6 mV
Lead Channel Setting Sensing Sensitivity: 1 mV
Pulse Gen Serial Number: 111765

## 2017-05-16 MED ORDER — FUROSEMIDE 40 MG PO TABS: 20.0000 mg | ORAL_TABLET | Freq: Every day | ORAL | 3 refills | Status: DC

## 2017-05-16 NOTE — Progress Notes (Signed)
HPI [name] returns today for followup. She is a pleasant 84 yo woman with a h/o chronic systolic CHF, LBBB, s/p BiV ICD insertion. She has had problems with dizziness. She notes that when she gets up too quickly, she will get dizzy and it will last for a few minutes, better if she sits down. No syncope. No ICD shocks. Allergies  Allergen Reactions  . Iodine Shortness Of Breath    Iodine contrast, CHF , SOB  . Shellfish Allergy Shortness Of Breath  . Aspirin Nausea And Vomiting  . Codeine Nausea And Vomiting     Current Outpatient Medications  Medication Sig Dispense Refill  . acetaminophen (TYLENOL) 325 MG tablet Take 2 tablets (650 mg total) by mouth every 6 (six) hours as needed for mild pain (or Fever >/= 101).    Marland Kitchen buPROPion (WELLBUTRIN XL) 150 MG 24 hr tablet Take 3 tablets (450 mg total) by mouth daily. 270 tablet 3  . cholecalciferol (VITAMIN D) 1000 UNITS tablet Take 1,000 Units by mouth daily.    Marland Kitchen docusate sodium (COLACE) 50 MG capsule Take 1 capsule (50 mg total) by mouth 2 (two) times daily. 60 capsule 0  . furosemide (LASIX) 40 MG tablet Take one (1) tablet (40 mg total) by mouth every morning and and half (1/2) tablet (20 mg total) by mouth every evening. 135 tablet 3  . levothyroxine (SYNTHROID, LEVOTHROID) 100 MCG tablet Take 1 tablet (100 mcg total) by mouth daily before breakfast. 90 tablet 3  . Magnesium 250 MG TABS Take 250 mg by mouth daily.    . memantine (NAMENDA) 5 MG tablet Week 1 5mg  daily then week 2 1 tab twice daily, then week 3 1 am 2 tabs pm, then 2 tabs twice daily 60 tablet 6  . metoprolol tartrate (LOPRESSOR) 25 MG tablet TAKE 1 TABLET BY MOUTH  EVERY MORNING AND ONE-HALF  TABLET EVERY EVENING 135 tablet 1  . olmesartan (BENICAR) 40 MG tablet TAKE 1 TABLET BY MOUTH  EVERY MORNING FOR BLOOD  PRESSURE 90 tablet 1  . Omega-3 Fatty Acids (FISH OIL) 1200 MG CAPS Take 1,200 mg by mouth every morning.    Marland Kitchen omeprazole (PRILOSEC) 40 MG capsule TAKE 1  CAPSULE BY MOUTH  DAILY FOR STOMACH 90 capsule 1  . Oxcarbazepine (TRILEPTAL) 300 MG tablet Take 300 mg by mouth every morning.     . polyethylene glycol (MIRALAX / GLYCOLAX) packet Take 17 g by mouth daily as needed. 10 each 0  . potassium chloride (K-DUR,KLOR-CON) 10 MEQ tablet TAKE 1 TABLET BY MOUTH  DAILY 90 tablet 1  . sertraline (ZOLOFT) 100 MG tablet Take 200 mg by mouth every morning.     Marland Kitchen spironolactone (ALDACTONE) 25 MG tablet TAKE 1 TABLET BY MOUTH  EVERY MORNING 90 tablet 1  . tiZANidine (ZANAFLEX) 4 MG tablet TAKE 1 TABLET EVERY 6 HOURS AS NEEDED FOR MUSCLE SPASM. 60 tablet 2  . vitamin E 1000 UNIT capsule Take 1,000 Units by mouth daily.     No current facility-administered medications for this visit.      Past Medical History:  Diagnosis Date  . Arthritis   . Cancer [facility])    left breast cancer   . CHF (congestive heart failure) ([place])    PACEMAKER & DEFIB  . Complication of anesthesia    hypotensive after back surgery in 2006- reports on chart   . Depression   . Dyslipidemia   . Fainted 04/21/06  AT [facility]  . GERD (gastroesophageal reflux disease)   . Headache(784.0)   . Hearing loss   . HLD (hyperlipidemia)   . Hypertension   . Hypothyroidism   . ICD (implantable cardiac defibrillator) in place    pt has pacer/icd  . ICD (implantable cardiac defibrillator), biventricular, in situ   . LBBB (left bundle branch block)   . Memory loss   . Nonischemic cardiomyopathy ([place])   . Normal coronary arteries    s/p cardiac cath 2007  . Pacemaker    Guidant Device  . Syncope   . Systolic CHF ([place])   . Vertigo   . Wears glasses     ROS:   All systems reviewed and negative except as noted in the HPI.   Past Surgical History:  Procedure Laterality Date  . BACK SURGERY     lumbar fusion   . BREAST SURGERY  2000   LUMP REMOVAL. STAGE 1 CANCER  . CARDIAC CATHETERIZATION    . CATARACT EXTRACTION    . EYE SURGERY    . IMPLANTABLE CARDIOVERTER DEFIBRILLATOR  GENERATOR CHANGE N/A 12/18/2012   Procedure: IMPLANTABLE CARDIOVERTER DEFIBRILLATOR GENERATOR CHANGE;  Surgeon: [name], MD;  Location[facility] CATH LAB;  Service: Cardiovascular;  Laterality: N/A;  . JOINT REPLACEMENT  06/14/01   right  . LUMBAR FUSION  2006  . MASS EXCISION  11/08/2011   Procedure: EXCISION MASS;  Surgeon: [name], MD;  Location: [facility];  Service: General;  Laterality: Left;  Excision Left Thigh Mass  . MASTECTOMY, PARTIAL  2008   GOT PACEMAKER AND DEFIB AT THAT TIME  . PACEMAKER INSERTION  04/23/06  . TOTAL KNEE ARTHROPLASTY  05/17/01   RIGHT KNEE  . TOTAL KNEE ARTHROPLASTY Left 11/29/2014   Procedure: TOTAL LEFT KNEE ARTHROPLASTY;  Surgeon: Paralee Cancel, MD;  Location: [facility];  Service: Orthopedics;  Laterality: Left;     Family History  Problem Relation Age of Onset  . Hypertension Mother   . Arthritis Mother   . Hypertension Father   . Hypertension Brother   . Hypertension Brother      Social History   Socioeconomic History  . Marital status: Married    Spouse name: Not on file  . Number of children: 1  . Years of education: Masters  . Highest education level: Not on file  Social Needs  . Financial resource strain: Not hard at all  . Food insecurity - worry: Never true  . Food insecurity - inability: Never true  . Transportation needs - medical: No  . Transportation needs - non-medical: No  Occupational History  . Occupation: Retired  Tobacco Use  . Smoking status: Never Smoker  . Smokeless tobacco: Never Used  Substance and Sexual Activity  . Alcohol use: No  . Drug use: No  . Sexual activity: Not Currently    Birth control/protection: Post-menopausal  Other Topics Concern  . Not on file  Social History Narrative   Lives at home with husband.   Right-handed.      As of 07/28/2014   Diet: No special diet   Caffeine: yes, Chocolate, tea and sodas    Married: YES, 1970   House: Yes, 2 stories, 2-3 persons live in home   Pets: No    Current/Past profession: Engineer, mining, Nurse Practitioner    Exercise: Yes 2-3 x weekly   Living Will: Yes   DNR: No   POA/HPOA: No        BP 128/72  Pulse 72   Ht 4\' 11"  (1.499 m)   Wt 127 lb (57.6 kg)   BMI 25.65 kg/m   Physical Exam:  Well appearing 84 yo woman, NAD HEENT: Unremarkable Neck:  No JVD, no thyromegally Lymphatics:  No adenopathy Back:  No CVA tenderness Lungs:  Clear with no wheezes HEART:  Regular rate rhythm, no murmurs, no rubs, no clicks Abd:  soft, positive bowel sounds, no organomegally, no rebound, no guarding Ext:  2 plus pulses, no edema, no cyanosis, no clubbing Skin:  No rashes no nodules Neuro:  CN II through XII intact, motor grossly intact  EKG - NSR with biv pacing  DEVICE  Normal device function.  See PaceArt for details.   Assess/Plan: 1. Chronic systolic heart failure - she is well compensated. Se will continue her current meds. 2. BiV ICD - her [place] device is working normally. Will recheck in several months. 3. Dizziness - sounds orthostatic. I have asked her to reduce her dose of lasix to 20 mg daily. 4. HTN - her blood pressure has been well controlled. Will follow.  Mikle Bosworth.D.

## 2017-05-16 NOTE — Patient Instructions (Addendum)
Medication Instructions:  Your physician has recommended you make the following change in your medication:  1.  Reduce your furosemide 40 mg:  Take 1/2 tablet by mouth in the morning only (once daily).  Do not take at bedtime.  Labwork: None ordered.  Testing/Procedures: None ordered.  Follow-Up: Your physician wants you to follow-up in: one year with Dr. Lovena Le.   You will receive a reminder letter in the mail two months in advance. If you don't receive a letter, please call our office to schedule the follow-up appointment.  Remote monitoring is used to monitor your ICD from home. This monitoring reduces the number of office visits required to check your device to one time per year. It allows [place] to keep an eye on the functioning of your device to ensure it is working properly. You are scheduled for a device check from home on 08/07/2017. You may send your transmission at any time that day. If you have a wireless device, the transmission will be sent automatically. After your physician reviews your transmission, you will receive a postcard with your next transmission date.  Any Other Special Instructions Will Be Listed Below (If Applicable).  If you need a refill on your cardiac medications before your next appointment, please call your pharmacy.

## 2017-05-17 DIAGNOSIS — M19049 Primary osteoarthritis, unspecified hand: Secondary | ICD-10-CM | POA: Insufficient documentation

## 2017-05-20 ENCOUNTER — Other Ambulatory Visit: Payer: Self-pay

## 2017-05-20 DIAGNOSIS — I70223 Atherosclerosis of native arteries of extremities with rest pain, bilateral legs: Secondary | ICD-10-CM

## 2017-05-23 ENCOUNTER — Other Ambulatory Visit: Payer: Self-pay | Admitting: Internal Medicine

## 2017-05-27 LAB — CUP PACEART REMOTE DEVICE CHECK
Battery Remaining Longevity: 72 mo
Battery Remaining Percentage: 100 %
Brady Statistic RA Percent Paced: 4 %
Brady Statistic RV Percent Paced: 100 %
Date Time Interrogation Session: 20190130091200
HighPow Impedance: 45 Ohm
Implantable Lead Implant Date: 20080116
Implantable Lead Implant Date: 20080116
Implantable Lead Implant Date: 20080116
Implantable Lead Location: 753859
Implantable Lead Location: 753860
Implantable Lead Location: 753860
Implantable Lead Model: 157
Implantable Lead Model: 4469
Implantable Lead Model: 4555
Implantable Lead Serial Number: 136532
Implantable Lead Serial Number: 161542
Implantable Lead Serial Number: 473495
Implantable Pulse Generator Implant Date: 20140911
Lead Channel Impedance Value: 389 Ohm
Lead Channel Impedance Value: 528 Ohm
Lead Channel Impedance Value: 765 Ohm
Lead Channel Pacing Threshold Amplitude: 0.4 V
Lead Channel Pacing Threshold Amplitude: 0.7 V
Lead Channel Pacing Threshold Amplitude: 0.9 V
Lead Channel Pacing Threshold Pulse Width: 0.4 ms
Lead Channel Pacing Threshold Pulse Width: 0.4 ms
Lead Channel Pacing Threshold Pulse Width: 0.8 ms
Lead Channel Setting Pacing Amplitude: 2 V
Lead Channel Setting Pacing Amplitude: 2 V
Lead Channel Setting Pacing Amplitude: 2.4 V
Lead Channel Setting Pacing Pulse Width: 0.4 ms
Lead Channel Setting Pacing Pulse Width: 0.8 ms
Lead Channel Setting Sensing Sensitivity: 0.6 mV
Lead Channel Setting Sensing Sensitivity: 1 mV
Pulse Gen Serial Number: 111765

## 2017-05-31 ENCOUNTER — Ambulatory Visit (INDEPENDENT_AMBULATORY_CARE_PROVIDER_SITE_OTHER)
Admission: RE | Admit: 2017-05-31 | Discharge: 2017-05-31 | Disposition: A | Discharge status: Home / Self Care | Payer: Medicare Other | Source: Ambulatory Visit | Attending: Vascular Surgery | Admitting: Vascular Surgery

## 2017-05-31 ENCOUNTER — Ambulatory Visit (HOSPITAL_COMMUNITY)
Admission: RE | Admit: 2017-05-31 | Discharge: 2017-05-31 | Disposition: A | Discharge status: Home / Self Care | Payer: Medicare Other | Source: Ambulatory Visit | Attending: Vascular Surgery | Admitting: Vascular Surgery

## 2017-05-31 DIAGNOSIS — I2510 Atherosclerotic heart disease of native coronary artery without angina pectoris: Secondary | ICD-10-CM | POA: Diagnosis not present

## 2017-05-31 DIAGNOSIS — I10 Essential (primary) hypertension: Secondary | ICD-10-CM | POA: Diagnosis not present

## 2017-05-31 DIAGNOSIS — I70223 Atherosclerosis of native arteries of extremities with rest pain, bilateral legs: Secondary | ICD-10-CM | POA: Insufficient documentation

## 2017-06-03 LAB — VAS US LOWER EXTREMITY ARTERIAL DUPLEX
Left ant tibial distal sys: 33 cm/s
Left popliteal prox sys PSV: 45 cm/s
Left super femoral dist sys PSV: -202 cm/s
Left super femoral mid sys PSV: -198 cm/s
Left super femoral prox sys PSV: 87 cm/s
left post tibial dist sys: 10 cm/s

## 2017-06-10 NOTE — Progress Notes (Addendum)
Established Intermittent Claudication   History of Present Illness   [name] is a 84 y.o. (1934/01/16) female who presents with chief complaint: right heel pain.  The patient's symptoms have not progressed.  The patient's symptoms are: neuropathic in character but also have more mechanical quality today.  She notes more heel pain with ambulation.  Her sx were not consistent with her outside imaging studies, so I recommended: formal BLE ABI and BLE arterial duplexes.  The patient's treatment regimen currently included: maximal medical management.  The patient denies any wounds.  The patient's PMH, PSH, and SH, and FamHx are unchanged from 05/15/17.  Current Outpatient Medications  Medication Sig Dispense Refill  . acetaminophen (TYLENOL) 325 MG tablet Take 2 tablets (650 mg total) by mouth every 6 (six) hours as needed for mild pain (or Fever >/= 101).    Marland Kitchen buPROPion (WELLBUTRIN XL) 150 MG 24 hr tablet Take 3 tablets (450 mg total) by mouth daily. 270 tablet 3  . cholecalciferol (VITAMIN D) 1000 UNITS tablet Take 1,000 Units by mouth daily.    Marland Kitchen docusate sodium (COLACE) 50 MG capsule Take 1 capsule (50 mg total) by mouth 2 (two) times daily. 60 capsule 0  . furosemide (LASIX) 40 MG tablet Take 0.5 tablets (20 mg total) by mouth daily. 45 tablet 3  . levothyroxine (SYNTHROID, LEVOTHROID) 100 MCG tablet Take 1 tablet (100 mcg total) by mouth daily before breakfast. 90 tablet 3  . Magnesium 250 MG TABS Take 250 mg by mouth daily.    . memantine (NAMENDA) 5 MG tablet Week 1 5mg  daily then week 2 1 tab twice daily, then week 3 1 am 2 tabs pm, then 2 tabs twice daily 60 tablet 6  . metoprolol tartrate (LOPRESSOR) 25 MG tablet TAKE 1 TABLET BY MOUTH  EVERY MORNING AND ONE-HALF  TABLET EVERY EVENING 135 tablet 1  . olmesartan (BENICAR) 40 MG tablet TAKE 1 TABLET BY MOUTH  EVERY MORNING FOR BLOOD  PRESSURE 90 tablet 1  . Omega-3 Fatty Acids (FISH OIL) 1200 MG CAPS Take 1,200 mg by mouth every  morning.    Marland Kitchen omeprazole (PRILOSEC) 40 MG capsule TAKE 1 CAPSULE BY MOUTH  DAILY FOR STOMACH 90 capsule 1  . Oxcarbazepine (TRILEPTAL) 300 MG tablet Take 300 mg by mouth every morning.     . polyethylene glycol (MIRALAX / GLYCOLAX) packet Take 17 g by mouth daily as needed. 10 each 0  . potassium chloride (K-DUR,KLOR-CON) 10 MEQ tablet TAKE 1 TABLET BY MOUTH  DAILY 90 tablet 1  . sertraline (ZOLOFT) 100 MG tablet Take 200 mg by mouth every morning.     Marland Kitchen spironolactone (ALDACTONE) 25 MG tablet TAKE 1 TABLET BY MOUTH  EVERY MORNING 90 tablet 1  . tiZANidine (ZANAFLEX) 4 MG tablet TAKE 1 TABLET EVERY 6 HOURS AS NEEDED FOR MUSCLE SPASM. 60 tablet 2  . vitamin E 1000 UNIT capsule Take 1,000 Units by mouth daily.     No current facility-administered medications for this visit.     On ROS today: no wounds, atypical rest pain.   Physical Examination   Vitals:   06/12/17 1016  BP: 106/64  Pulse: 73  Resp: 14  Temp: 97.6 F (36.4 C)  TempSrc: Oral  SpO2: 98%  Weight: 131 lb (59.4 kg)  Height: 4\' 10"  (1.473 m)   Body mass index is 27.38 kg/m.  General Alert, O x 3, WD, NAD  Pulmonary Sym exp, good B air movt, CTA  B  Cardiac RRR, Nl S1, S2, no Murmurs, No rubs, No S3,S4  Vascular Vessel Right Left  Radial Palpable Palpable  Brachial Palpable Palpable  Carotid Palpable, No Bruit Palpable, No Bruit  Aorta Not palpable N/A  Femoral Palpable Palpable  Popliteal Not palpable Not palpable  PT Not palpable Not palpable  DP Faintly palpable Faintly palpable    Gastro- intestinal soft, non-distended, non-tender to palpation, No guarding or rebound, no HSM, no masses, no CVAT B, No palpable prominent aortic pulse,    Musculo- skeletal M/S 5/5 throughout  , Extremities without ischemic changes  , No edema present, Varicosities present: B small, No Lipodermatosclerosis present  Neurologic Pain and light touch intact in extremities , Motor exam as listed above    Non-Invasive Vascular  imaging   ABI (05/31/17) +-------+-----------+-----------+------------+------------+ ABI/TBIToday's ABIToday's TBIPrevious ABIPrevious TBI +-------+-----------+-----------+------------+------------+ Right 1.03    0.61                 +-------+-----------+-----------+------------+------------+ Left  0.56    0.54                 +-------+-----------+-----------+------------+------------+  Final Interpretation: Right: Resting right ankle-brachial index is within normal range. No evidence of significant right lower extremity arterial disease. The right toe-brachial index is abnormal. Left: Resting left ankle-brachial index indicates moderate left lower extremity arterial disease. The left toe-brachial index is abnormal.   LLE Duplex (05/31/17) Left Duplex Findings: +----------+--------+-----+---------------+----------+--------+      PSV cm/sRatioStenosis    Waveform Comments +----------+--------+-----+---------------+----------+--------+ CFA Mid  127              triphasic      +----------+--------+-----+---------------+----------+--------+ DFA    91              triphasic      +----------+--------+-----+---------------+----------+--------+ SFA Prox 87              biphasic      +----------+--------+-----+---------------+----------+--------+ SFA Mid  202      75-99% stenosisbiphasic      +----------+--------+-----+---------------+----------+--------+ SFA Distal0       occluded              +----------+--------+-----+---------------+----------+--------+ POP Prox 45                        +----------+--------+-----+---------------+----------+--------+ ATA Distal33              monophasic      +----------+--------+-----+---------------+----------+--------+ PTA Distal10              monophasic     +----------+--------+-----+---------------+----------+--------+  A focal velocity elevation of 202 cm/s was obtained at [facility] SFA with a VR of 4.8. Findings are characteristic of 75-99% stenosis.   Medical Decision Making   [name] is a 84 y.o. female who presents with:  Atypical R leg pain, minimal PAD in R leg, moderate PAD L leg without evidence of critical limb ischemia.   Again this patient's sx are not consistent with arterial disease, as evident by the fact that the patient's sx are present in both legs, L>R, but triphasic flow in R DP.  LLE arterial duplex demonstrates a distal SFA/pop occlusion.  At this point, the patient doesn't feel her sx merit any endovascular intervention.  Based on the patient's vascular studies and examination, I have offered the patient: q6 month BLE ABI.  I discussed in depth with the patient the nature of atherosclerosis, and emphasized the importance of maximal medical management including strict  control of blood pressure, blood glucose, and lipid levels, antiplatelet agents, obtaining regular exercise, and cessation of smoking.    The patient is aware that without maximal medical management the underlying atherosclerotic disease process will progress, limiting the benefit of any interventions. The patient is currently not on on statin as not medically indicated.  The patient is currently on an anti-platelet: ASA.  Thank you for allowing [place] to participate in this patient's care.   Adele Barthel, MD, FACS [facility] Office: [phone] Pager: [phone]

## 2017-06-12 ENCOUNTER — Ambulatory Visit (INDEPENDENT_AMBULATORY_CARE_PROVIDER_SITE_OTHER): Payer: Medicare Other | Admitting: Vascular Surgery

## 2017-06-12 ENCOUNTER — Other Ambulatory Visit: Payer: Self-pay

## 2017-06-12 ENCOUNTER — Encounter: Payer: Self-pay | Admitting: Vascular Surgery

## 2017-06-12 VITALS — BP 106/64 | HR 73 | Temp 97.6°F | Resp 14 | Ht <= 58 in | Wt 131.0 lb

## 2017-06-12 DIAGNOSIS — I70213 Atherosclerosis of native arteries of extremities with intermittent claudication, bilateral legs: Secondary | ICD-10-CM | POA: Diagnosis not present

## 2017-06-13 ENCOUNTER — Other Ambulatory Visit: Payer: Self-pay

## 2017-06-13 DIAGNOSIS — I70213 Atherosclerosis of native arteries of extremities with intermittent claudication, bilateral legs: Secondary | ICD-10-CM

## 2017-06-15 ENCOUNTER — Other Ambulatory Visit: Payer: Self-pay | Admitting: Internal Medicine

## 2017-07-15 ENCOUNTER — Other Ambulatory Visit: Payer: Self-pay | Admitting: *Deleted

## 2017-07-15 MED ORDER — LOSARTAN POTASSIUM 100 MG PO TABS: 100.0000 mg | ORAL_TABLET | Freq: Every day | ORAL | 0 refills | Status: DC

## 2017-07-15 NOTE — Telephone Encounter (Signed)
Received fax from pharmacy stating patient's Olmesartan 40mg  is on backorder and needs to be changed to something different. Medication changed to Losartan 100mg  one daily per Dr. Eulas Post.

## 2017-07-23 NOTE — Progress Notes (Signed)
[facility]  PATIENT: [name] DOB: [birth year]   REASON FOR VISIT: Follow-up for memory loss/mild cognitive impairment HISTORY FROM: Patient    HISTORY OF PRESENT ILLNESS:HISTORY [name] is a 84 yo RH AAF referred by her primary care Dr. Baird Cancer for evaluation of memory trouble, drive herself to office alone at today's clinical visit. 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 Nicholes Calamity under general anesthesia, woke up from surgery, she noticed mild memory trouble, she has short-term memory trouble, has been persistent since then, she denies difficulty talking, no strokelike symptoms then. She lives at home with her family, highly functional, driving, independent at daily activity, able to keep her check in balance, She suffered long-standing history of bipolar disorder, on polypharmacy treatment, this including Trileptal 300 mg daily, Zoloft 100 mg a day, Wellbutrin 150 mg 3 tablets a day, She had accident of sudden onset dizziness couple days ago, after dinner, she felt lightheaded, has to crawling upstairs, was helped by her husband to get up, then she fell to the ground, whole-body shaking, no loss of consciousness. She has baseline mild gait difficulty due to her low back pain, bilateral knee pain, She denied a family history of dementia, CT head in 2013, Unchanged mild atrophy and microvascular ischemic disease without acute intracranial process. She had a history of chronic systolic heart failure, left bundle branch block, status post biventricular ICD insertion in 2009, underwent device generator change out Sept 2014. S She presents for one year evaluation. She reports feeling dizzy Tuesday night with improvement by Wednesday morning. She also reports upper body "shaking". The only pain she  has is from arthritis. She gets some mild edema in her right ankle. She reports her blood pressure at home usually runs 110-120/60-70. Sometimes when she gets up quickly, she felt lightheaded.  UPDATE April 8th 2016: She is overall doing very well, only has occasionally dizziness, especially when she gets up quickly, today's Mini-Mental status examination is 29 out of 30 CAT scan of the brain showed mild small vessel disease, no acute lesions, EEG showed mild slowing  UPDATE July 16 2016:YY She drive here herself, she continue complains of worsening memory loss, she lives with her husband in their house of more than 78 years old, she is independent in her daily activity, exercise regularly,  Following senior emexercise program on TV, she has mild low back pain, cause mild gait difficulty sometimes, radiating pain to bilateral lower extremity, she has no bowel and bladder incontinence. UPDATE 10/10/2018CM [name], 84 year old female returns for follow-up with history of memory loss mild cognitive impairment. She lives with her husband. She no longer does the finances but is basically independent in most other activities. She continues to cook without burning any recipes. She continues to drive without getting lost. She exercises regularly by walking short distances she has had one fall in the last 6 months, she missed a step in the darkness. She does not do anything to really stimulate her memory such as cross words etc. She continues to complain of mild low back pain no bowel or bladder incontinence. She had side effects to 10 mg of Aricept so she cut the dose in half however she still complains with drowsiness and fogginess on the medication. She has a history of bipolar disorder and is on polypharmacy.  She returns for reevaluation UPDATE 4/17/2019CM Stacey. Hurley, 84 year old female returns for follow-up with a history of memory loss mild cognitive impairment.  She has failed Aricept in the past as well  as most recently Namenda due to side effects.  She continues to live with her husband.  She no longer does the finances but is independent in other activities of daily living.  She denies getting lost when driving she actually exercises 4 days a week.  She has had one fall in the last 6 months she tripped  in her backyard, no apparent injury.  She also has a history of low back pain without bowel or bladder incontinence.  She has a history of bipolar disorder and is on polypharmacy.  She returns for reevaluation  REVIEW OF SYSTEMS: Full 14 system review of systems performed and notable only for those listed, all others are neg:  Constitutional: neg  Cardiovascular: neg Ear/Nose/Throat: neg  Skin: neg Eyes: neg Respiratory: neg Gastroitestinal: neg  Hematology/Lymphatic: neg  Endocrine: Intolerance to heat Musculoskeletal: Back pain Allergy/Immunology: Environmental allergies Neurological: Memory loss, tremors Psychiatric: Depression and anxiety Sleep : neg   ALLERGIES: Allergies  Allergen Reactions  . Iodine Shortness Of Breath    Iodine contrast, CHF , SOB  . Shellfish Allergy Shortness Of Breath  . Memantine     Malaise, fogginess/ couldn't think  . Aspirin Nausea And Vomiting  . Codeine Nausea And Vomiting    HOME MEDICATIONS: Outpatient Medications Prior to Visit  Medication Sig Dispense Refill  . acetaminophen (TYLENOL) 325 MG tablet Take 2 tablets (650 mg total) by mouth every 6 (six) hours as needed for mild pain (or Fever >/= 101).    Marland Kitchen buPROPion (WELLBUTRIN XL) 150 MG 24 hr tablet Take 3 tablets (450 mg total) by mouth daily. 270 tablet 3  . cholecalciferol (VITAMIN D) 1000 UNITS tablet Take 1,000 Units by mouth daily.    Marland Kitchen docusate sodium (COLACE) 50 MG capsule Take 1 capsule (50 mg total) by mouth 2 (two) times daily. 60 capsule 0  . furosemide (LASIX) 40 MG tablet Take 0.5 tablets (20 mg total) by mouth daily. 45 tablet 3  . levothyroxine (SYNTHROID, LEVOTHROID) 100  MCG tablet Take 1 tablet (100 mcg total) by mouth daily before breakfast. 90 tablet 3  . losartan (COZAAR) 100 MG tablet Take 1 tablet (100 mg total) by mouth daily. 30 tablet 0  . Magnesium 250 MG TABS Take 250 mg by mouth daily.    . metoprolol tartrate (LOPRESSOR) 25 MG tablet TAKE 1 TABLET BY MOUTH  EVERY MORNING AND ONE-HALF  TABLET EVERY EVENING 135 tablet 1  . Omega-3 Fatty Acids (FISH OIL) 1200 MG CAPS Take 1,200 mg by mouth every morning.    Marland Kitchen omeprazole (PRILOSEC) 40 MG capsule TAKE 1 CAPSULE BY MOUTH  DAILY FOR STOMACH 90 capsule 1  . Oxcarbazepine (TRILEPTAL) 300 MG tablet Take 300 mg by mouth every morning.     . polyethylene glycol (MIRALAX / GLYCOLAX) packet Take 17 g by mouth daily as needed. 10 each 0  . potassium chloride (K-DUR,KLOR-CON) 10 MEQ tablet TAKE 1 TABLET BY MOUTH  DAILY 90 tablet 1  . sertraline (ZOLOFT) 100 MG tablet Take 200 mg by mouth every morning.     Marland Kitchen spironolactone (ALDACTONE) 25 MG tablet TAKE 1 TABLET BY MOUTH  EVERY MORNING 90 tablet 1  . tiZANidine (ZANAFLEX) 4 MG tablet TAKE 1 TABLET EVERY 6 HOURS AS NEEDED FOR MUSCLE SPASM. 60 tablet 2  .  vitamin E 1000 UNIT capsule Take 1,000 Units by mouth daily.    . memantine (NAMENDA) 5 MG tablet Week 1 5mg  daily then week 2 1 tab twice daily, then week 3 1 am 2 tabs pm, then 2 tabs twice daily (Patient not taking: Reported on 06/12/2017) 60 tablet 6   No facility-administered medications prior to visit.     PAST MEDICAL HISTORY: Past Medical History:  Diagnosis Date  . Arthritis   . Cancer [facility])    left breast cancer   . CHF (congestive heart failure) ([place])    PACEMAKER & DEFIB  . Complication of anesthesia    hypotensive after back surgery in 2006- reports on chart   . Depression   . Dyslipidemia   . Fainted 04/21/06   AT [place]  . GERD (gastroesophageal reflux disease)   . Headache(784.0)   . Hearing loss   . HLD (hyperlipidemia)   . Hypertension   . Hypothyroidism   . ICD (implantable cardiac  defibrillator) in place    pt has pacer/icd  . ICD (implantable cardiac defibrillator), biventricular, in situ   . LBBB (left bundle branch block)   . Memory loss   . Nonischemic cardiomyopathy ([place])   . Normal coronary arteries    s/p cardiac cath 2007  . Pacemaker    Guidant Device  . Syncope   . Systolic CHF ([place])   . Vertigo   . Wears glasses     PAST SURGICAL HISTORY: Past Surgical History:  Procedure Laterality Date  . BACK SURGERY     lumbar fusion   . BREAST SURGERY  2000   LUMP REMOVAL. STAGE 1 CANCER  . CARDIAC CATHETERIZATION    . CATARACT EXTRACTION    . EYE SURGERY    . IMPLANTABLE CARDIOVERTER DEFIBRILLATOR GENERATOR CHANGE N/A 12/18/2012   Procedure: IMPLANTABLE CARDIOVERTER DEFIBRILLATOR GENERATOR CHANGE;  Surgeon: [name], MD;  Location: [facility] CATH LAB;  Service: Cardiovascular;  Laterality: N/A;  . JOINT REPLACEMENT  06/14/01   right  . LUMBAR FUSION  2006  . MASS EXCISION  11/08/2011   Procedure: EXCISION MASS;  Surgeon: [name], MD;  Location: [facility] ORS;  Service: General;  Laterality: Left;  Excision Left Thigh Mass  . MASTECTOMY, PARTIAL  2008   GOT PACEMAKER AND DEFIB AT THAT TIME  . PACEMAKER INSERTION  04/23/06  . TOTAL KNEE ARTHROPLASTY  05/17/01   RIGHT KNEE  . TOTAL KNEE ARTHROPLASTY Left 11/29/2014   Procedure: TOTAL LEFT KNEE ARTHROPLASTY;  Surgeon: Paralee Cancel, MD;  Location: [facility] ORS;  Service: Orthopedics;  Laterality: Left;    FAMILY HISTORY: Family History  Problem Relation Age of Onset  . Hypertension Mother   . Arthritis Mother   . Hypertension Father   . Hypertension Brother   . Hypertension Brother     SOCIAL HISTORY: Social History   Socioeconomic History  . Marital status: Married    Spouse name: Not on file  . Number of children: 1  . Years of education: Masters  . Highest education level: Not on file  Occupational History  . Occupation: Retired  Scientific laboratory technician  . Financial resource strain: Not hard at all  . Food  insecurity:    Worry: Never true    Inability: Never true  . Transportation needs:    Medical: No    Non-medical: No  Tobacco Use  . Smoking status: Never Smoker  . Smokeless tobacco: Never Used  Substance and Sexual Activity  . Alcohol use:  No  . Drug use: No  . Sexual activity: Not Currently    Birth control/protection: Post-menopausal  Lifestyle  . Physical activity:    Days per week: 4 days    Minutes per session: 30 min  . Stress: To some extent  Relationships  . Social connections:    Talks on phone: More than three times a week    Gets together: Once a week    Attends religious service: More than 4 times per year    Active member of club or organization: Yes    Attends meetings of clubs or organizations: Never    Relationship status: Married  . Intimate partner violence:    Fear of current or ex partner: No    Emotionally abused: No    Physically abused: No    Forced sexual activity: No  Other Topics Concern  . Not on file  Social History Narrative   Lives at home with husband.   Right-handed.      As of 07/28/2014   Diet: No special diet   Caffeine: yes, Chocolate, tea and sodas    Married: YES, 1970   House: Yes, 2 stories, 2-3 persons live in home   Pets: No   Current/Past profession: Engineer, mining, Nurse Practitioner    Exercise: Yes 2-3 x weekly   Living Will: Yes   DNR: No   POA/HPOA: No        PHYSICAL EXAM  Vitals:   07/24/17 0915  BP: 121/68  Pulse: 70  Weight: 128 lb 3.2 oz (58.2 kg)  Height: 4\' 10"  (1.473 m)   Body mass index is 26.79 kg/m.  Generalized: Well developed, in no acute distress  Head: normocephalic and atraumatic,. Oropharynx benign  Neck: Supple, no carotid bruits  Cardiac: Regular rate rhythm, no murmur  Musculoskeletal: No deformity   Neurological examination   Mentation: Alert AFT 10 Clock drawing 3/4 MMSE - Mini Mental State Exam 07/24/2017 01/16/2017 01/16/2017  Orientation to time 4 4 4   Orientation to  Place 5 5 5   Registration 3 3 3   Attention/ Calculation 2 5 5   Recall 2 1 1   Language- name 2 objects 2 2 2   Language- repeat 1 1 1   Language- follow 3 step command 3 3 3   Language- read & follow direction 1 1 1   Write a sentence 1 1 1   Copy design 1 1 1   Total score 25 27 27    Follows all commands speech and language fluent.   Cranial nerve II-XII: Fundoscopic exam reveals sharp disc margins.Pupils were equal round reactive to light extraocular movements were full, visual field were full on confrontational test. Facial sensation and strength were normal. hearing was intact to finger rubbing bilaterally. Uvula tongue midline. head turning and shoulder shrug were normal and symmetric.Tongue protrusion into cheek strength was normal. Motor: Mild bilateral toe extension flexion weakness , bilateral knee pain limits exam  Sensory: normal and symmetric to light touch, pinprick, and  Vibration, in the upper and lower extremities  Coordination: finger-nose-finger, heel-to-shin bilaterally, no dysmetria Reflexes: Brachioradialis 2/2, biceps 2/2, triceps 2/2, patellar absent , Achilles trace  plantar responses were flexor bilaterally. Gait and Station: Rising up from seated position without assistance, mild antalgic gait no assistive device  DIAGNOSTIC DATA (LABS, IMAGING, TESTING) - I reviewed patient records, labs, notes, testing and imaging myself where available.  Lab Results  Component Value Date   WBC 9.2 08/31/2016   HGB 12.2 08/31/2016   HCT 38.7 08/31/2016  MCV 88.2 08/31/2016   PLT 132 (L) 08/31/2016      Component Value Date/Time   NA 141 03/27/2017 1534   NA 139 10/05/2015 0944   K 4.2 03/27/2017 1534   CL 106 03/27/2017 1534   CO2 31 03/27/2017 1534   GLUCOSE 71 03/27/2017 1534   BUN 28 (H) 03/27/2017 1534   BUN 25 10/05/2015 0944   CREATININE 0.81 03/27/2017 1534   CALCIUM 10.2 03/27/2017 1534   PROT 6.5 03/27/2017 1534   PROT 6.4 10/05/2015 0944   ALBUMIN 4.1  12/07/2016 1215   ALBUMIN 4.3 10/05/2015 0944   AST 24 03/27/2017 1534   ALT 32 (H) 03/27/2017 1534   ALKPHOS 66 12/07/2016 1215   BILITOT 0.2 03/27/2017 1534   BILITOT <0.2 10/05/2015 0944   GFRNONAA 68 03/27/2017 1534   GFRAA 79 03/27/2017 1534   Lab Results  Component Value Date   CHOL 168 03/27/2017   HDL 65 03/27/2017   LDLCALC 82 03/27/2017   TRIG 118 03/27/2017   CHOLHDL 2.6 03/27/2017    Lab Results  Component Value Date   VITAMINB12 1,117 (H) 07/28/2014   Lab Results  Component Value Date   TSH 2.04 03/27/2017      ASSESSMENT AND PLAN  84 y.o. year old female  has a past medical history of Mild Cognitive impairment, gait abnormality bilateral knee pain. Patient has had side effects to Aricept and Namenda .                                      Exelon 4. 6 mg/24 hour patch for memory loss  Continue exercise program 4 times weekly,Gait abnormality multifactorial to include bilateral knee pain chronic low back pain mild toe extension weakness she will continue to exercises by walking at least 30 minutes a day For your mild cognitive impairment important to do exercises to stimulate the memory such as cross words , word search, Scrabble, any strategy games. F/U In 6 months. Pt is not interested in research study for memory Follow up 6 months   I spent 25 min in total face to face time with the patient more than 50% of which was spent counseling and coordination of care, reviewing test results reviewing medications and discussing and reviewing the diagnosis of Mild cognitive impairment and further treatment options.  Patient has failed both  Aricept and  Namenda.  Patient is willing to try Exelon patch.  Given a handout on proper application of the patch.  Also the importance of exercise the memory by cross words word search etc. ,        Dennie Bible, [facility], [facility], APRN  [facility] [address], [employer] [place], [place] [address] [phone]

## 2017-07-24 ENCOUNTER — Encounter: Payer: Self-pay | Admitting: Nurse Practitioner

## 2017-07-24 ENCOUNTER — Ambulatory Visit (INDEPENDENT_AMBULATORY_CARE_PROVIDER_SITE_OTHER): Payer: Medicare Other | Admitting: Nurse Practitioner

## 2017-07-24 VITALS — BP 121/68 | HR 70 | Ht <= 58 in | Wt 128.2 lb

## 2017-07-24 DIAGNOSIS — R269 Unspecified abnormalities of gait and mobility: Secondary | ICD-10-CM | POA: Diagnosis not present

## 2017-07-24 DIAGNOSIS — G3184 Mild cognitive impairment, so stated: Secondary | ICD-10-CM | POA: Diagnosis not present

## 2017-07-24 DIAGNOSIS — R413 Other amnesia: Secondary | ICD-10-CM

## 2017-07-24 MED ORDER — RIVASTIGMINE 4.6 MG/24HR TD PT24: 4.6000 mg | MEDICATED_PATCH | Freq: Every day | TRANSDERMAL | 6 refills | Status: DC

## 2017-07-24 NOTE — Patient Instructions (Signed)
Exelon 4. 6 mg/24 hour patch for memory loss  Continue exercise program 4 times weekly For your mild cognitive impairment important to do exercises to stimulate the memory such as cross words , word search, Scrabble, any strategy games. F/U In 6 months.

## 2017-08-06 ENCOUNTER — Other Ambulatory Visit: Payer: Self-pay | Admitting: Internal Medicine

## 2017-08-07 ENCOUNTER — Telehealth: Payer: Self-pay | Admitting: Nurse Practitioner

## 2017-08-07 ENCOUNTER — Ambulatory Visit (INDEPENDENT_AMBULATORY_CARE_PROVIDER_SITE_OTHER): Payer: Medicare Other | Admitting: *Deleted

## 2017-08-07 DIAGNOSIS — I428 Other cardiomyopathies: Secondary | ICD-10-CM | POA: Diagnosis not present

## 2017-08-07 NOTE — Telephone Encounter (Signed)
LMVM for pt to return call for SE exelon patch.

## 2017-08-07 NOTE — Telephone Encounter (Signed)
Pt called stating that since using the Exelon patch she has became "exteremly tired and hard to function". Please call to advise

## 2017-08-07 NOTE — Progress Notes (Signed)
Remote ICD transmission.   

## 2017-08-08 ENCOUNTER — Encounter: Payer: Self-pay | Admitting: Cardiology

## 2017-08-08 LAB — CUP PACEART REMOTE DEVICE CHECK
Battery Remaining Longevity: 72 mo
Battery Remaining Percentage: 100 %
Brady Statistic RA Percent Paced: 1 %
Brady Statistic RV Percent Paced: 100 %
Date Time Interrogation Session: 20190501081100
HighPow Impedance: 49 Ohm
Implantable Lead Implant Date: 20080116
Implantable Lead Implant Date: 20080116
Implantable Lead Implant Date: 20080116
Implantable Lead Location: 753859
Implantable Lead Location: 753860
Implantable Lead Location: 753860
Implantable Lead Model: 157
Implantable Lead Model: 4469
Implantable Lead Model: 4555
Implantable Lead Serial Number: 136532
Implantable Lead Serial Number: 161542
Implantable Lead Serial Number: 473495
Implantable Pulse Generator Implant Date: 20140911
Lead Channel Impedance Value: 427 Ohm
Lead Channel Impedance Value: 550 Ohm
Lead Channel Impedance Value: 829 Ohm
Lead Channel Pacing Threshold Amplitude: 0.6 V
Lead Channel Pacing Threshold Amplitude: 0.6 V
Lead Channel Pacing Threshold Amplitude: 0.9 V
Lead Channel Pacing Threshold Pulse Width: 0.4 ms
Lead Channel Pacing Threshold Pulse Width: 0.4 ms
Lead Channel Pacing Threshold Pulse Width: 0.8 ms
Lead Channel Setting Pacing Amplitude: 2 V
Lead Channel Setting Pacing Amplitude: 2 V
Lead Channel Setting Pacing Amplitude: 2.4 V
Lead Channel Setting Pacing Pulse Width: 0.4 ms
Lead Channel Setting Pacing Pulse Width: 0.8 ms
Lead Channel Setting Sensing Sensitivity: 0.6 mV
Lead Channel Setting Sensing Sensitivity: 1 mV
Pulse Gen Serial Number: 111765

## 2017-08-08 NOTE — Telephone Encounter (Signed)
Pt returned Rn's call. If calling on Friday 08/09/17 please call after 12.

## 2017-08-08 NOTE — Telephone Encounter (Signed)
LMVM for pt to return call, (2nd call).

## 2017-08-09 NOTE — Telephone Encounter (Addendum)
Spoke to pt she has noted from using the  exelon patch (started 07-31-17) that she as noted fatigue, tiredness.  Nothing else has changed.  I instructed her to stop using the patch for 2 wks and see if she returns to her baseline and give [place] a call to check in.  She will do this.  I asked if needed to speak to her husband and she said no.  She verbalized understanding of instructions.

## 2017-08-30 ENCOUNTER — Ambulatory Visit (INDEPENDENT_AMBULATORY_CARE_PROVIDER_SITE_OTHER): Payer: Medicare Other | Admitting: Internal Medicine

## 2017-08-30 ENCOUNTER — Encounter: Payer: Self-pay | Admitting: Internal Medicine

## 2017-08-30 VITALS — BP 126/60 | HR 63 | Temp 98.1°F | Resp 18 | Ht <= 58 in | Wt 125.2 lb

## 2017-08-30 DIAGNOSIS — M545 Low back pain, unspecified: Secondary | ICD-10-CM

## 2017-08-30 DIAGNOSIS — G3184 Mild cognitive impairment, so stated: Secondary | ICD-10-CM | POA: Diagnosis not present

## 2017-08-30 DIAGNOSIS — E782 Mixed hyperlipidemia: Secondary | ICD-10-CM

## 2017-08-30 DIAGNOSIS — Z79899 Other long term (current) drug therapy: Secondary | ICD-10-CM | POA: Diagnosis not present

## 2017-08-30 DIAGNOSIS — M79651 Pain in right thigh: Secondary | ICD-10-CM | POA: Diagnosis not present

## 2017-08-30 DIAGNOSIS — M79652 Pain in left thigh: Secondary | ICD-10-CM | POA: Diagnosis not present

## 2017-08-30 DIAGNOSIS — Z9889 Other specified postprocedural states: Secondary | ICD-10-CM | POA: Diagnosis not present

## 2017-08-30 DIAGNOSIS — I10 Essential (primary) hypertension: Secondary | ICD-10-CM | POA: Diagnosis not present

## 2017-08-30 DIAGNOSIS — E034 Atrophy of thyroid (acquired): Secondary | ICD-10-CM | POA: Diagnosis not present

## 2017-08-30 DIAGNOSIS — F3341 Major depressive disorder, recurrent, in partial remission: Secondary | ICD-10-CM | POA: Diagnosis not present

## 2017-08-30 DIAGNOSIS — G8929 Other chronic pain: Secondary | ICD-10-CM

## 2017-08-30 DIAGNOSIS — M810 Age-related osteoporosis without current pathological fracture: Secondary | ICD-10-CM | POA: Diagnosis not present

## 2017-08-30 DIAGNOSIS — K5900 Constipation, unspecified: Secondary | ICD-10-CM | POA: Diagnosis not present

## 2017-08-30 LAB — CBC WITH DIFFERENTIAL/PLATELET
Basophils Absolute: 60 cells/uL (ref 0–200)
Basophils Relative: 0.7 %
Eosinophils Absolute: 301 cells/uL (ref 15–500)
Eosinophils Relative: 3.5 %
HCT: 38.3 % (ref 35.0–45.0)
Hemoglobin: 12.8 g/dL (ref 11.7–15.5)
Lymphs Abs: 2193 cells/uL (ref 850–3900)
MCH: 28.4 pg (ref 27.0–33.0)
MCHC: 33.4 g/dL (ref 32.0–36.0)
MCV: 85.1 fL (ref 80.0–100.0)
MPV: 10.8 fL (ref 7.5–12.5)
Monocytes Relative: 8.8 %
Neutro Abs: 5289 cells/uL (ref 1500–7800)
Neutrophils Relative %: 61.5 %
Platelets: 134 10*3/uL — ABNORMAL LOW (ref 140–400)
RBC: 4.5 10*6/uL (ref 3.80–5.10)
RDW: 13.4 % (ref 11.0–15.0)
Total Lymphocyte: 25.5 %
WBC mixed population: 757 cells/uL (ref 200–950)
WBC: 8.6 10*3/uL (ref 3.8–10.8)

## 2017-08-30 LAB — BASIC METABOLIC PANEL WITH GFR
BUN/Creatinine Ratio: 27 (calc) — ABNORMAL HIGH (ref 6–22)
BUN: 27 mg/dL — ABNORMAL HIGH (ref 7–25)
CO2: 30 mmol/L (ref 20–32)
Calcium: 10.6 mg/dL — ABNORMAL HIGH (ref 8.6–10.4)
Chloride: 104 mmol/L (ref 98–110)
Creat: 0.99 mg/dL — ABNORMAL HIGH (ref 0.60–0.88)
GFR, Est African American: 62 mL/min/{1.73_m2} (ref >=60)
GFR, Est Non African American: 53 mL/min/{1.73_m2} — ABNORMAL LOW (ref >=60)
Glucose, Bld: 77 mg/dL (ref 65–139)
Potassium: 5.1 mmol/L (ref 3.5–5.3)
Sodium: 141 mmol/L (ref 135–146)

## 2017-08-30 LAB — LIPID PANEL
Cholesterol: 181 mg/dL (ref <200)
HDL: 56 mg/dL (ref >50)
LDL Cholesterol (Calc): 98 mg/dL (calc)
Non-HDL Cholesterol (Calc): 125 mg/dL (calc) (ref <130)
Total CHOL/HDL Ratio: 3.2 (calc) (ref <5.0)
Triglycerides: 175 mg/dL — ABNORMAL HIGH (ref <150)

## 2017-08-30 LAB — T4, FREE: Free T4: 1.1 ng/dL (ref 0.8–1.8)

## 2017-08-30 LAB — TSH: TSH: 2.21 mIU/L (ref 0.40–4.50)

## 2017-08-30 LAB — ALT: ALT: 17 U/L (ref 6–29)

## 2017-08-30 NOTE — Progress Notes (Signed)
Patient ID: [name], female   DOB: 1934-01-28, 84 y.o.   MRN: [id number]   Location:  [facility] OFFICE  Provider: DR Arletha Grippe  Code Status:  Goals of Care:  Advanced Directives 03/27/2017  Does Patient Have a Medical Advance Directive? Yes  Type of Advance Directive Living will;Out of facility DNR (pink MOST or yellow form)  Does patient want to make changes to medical advance directive? No - Patient declined  Copy of [place] in Chart? -  Pre-existing out of facility DNR order (yellow form or pink MOST form) Pink MOST form placed in chart (order not valid for inpatient use);Yellow form placed in chart (order not valid for inpatient use)     Chief Complaint  Patient presents with  . Medical Management of Chronic Issues    5 month follow up for MDD, dementia and thyroid. Patient mentioned that she is having some pain in both thighs x 1 month.   . Medication Refill    No refills needed at this time.     HPI: Patient is a 84 y.o. female seen today for medical management of chronic diseases.  She feels that her staggering and balance is worse. She was seen in the ED 04/22/17 for abdominal pain. Imaging revealed constipation. She still struggles with it. Currently taking colace and miralax. She has pain in her anterior thigh b/l x 1 month; occurs when she gets OOB and improves as day progresses. She has a hx back pain and has req'd sx. She rec'd epidural injections up until last yr when they topped helping her pain. She continues to have issues with memory loss which makes her a poor historian. Hx obtained from chart.  Skin tags on perineum - down to 1 tag. last colonoscopy in 06/2011. Rectal exam in Aug 2017 revealed no abnormalities. unchanged  Dementia - stable. She reports '5mg'$  aricept caused next day sedation and she stopped med. Followed by neuro Dr Krista Blue. MMSE 27/30. Weight overall stable since last ov. She was started on namenda '5mg'$  titration but she states '10mg'$   BID caused next day drowsiness and she reduced dose herself to 1 tab daily.   Hx Left distal radial nondisplaced fx - she fell on May 10, 2015 in parking lot. She was sent for xray and had a fx. She is followed by Dr Cay Schillings. Hand is painful occasionally. left knee pain stable and is followed by Dr Alvan Dame. She takes zanaflex prn   MDD - mood overall stable. She has hx weight loss. She is followed by pscyh Dr Reece Levy for psych. Currently taking sertraline, trileptal and wellbutrin xl. She has mentioned her weight loss to her mental health provider. She has never had counseling.  Chronic systolic HF/AICD&pacer/LBBB/NICM - followed by cardiology Dr Lovena Le. She takes lasix with K supplement, lopressor, benicar, aldactone. EF 45-50% in 2015  (prev 20% in 2008). Pacer last checked in Oct 2018.  Hyperlipidemia - diet controlled. She also takes flax seed oil. LDL 82.   Hypothyroidism - stable on levothyroxine. TSH 2.04; T4 free 0.9  She is a poor historian due to memory loss. Hx obtained from chart.     Past Medical History:  Diagnosis Date  . Arthritis   . Cancer [facility])    left breast cancer   . CHF (congestive heart failure) ([place])    PACEMAKER & DEFIB  . Complication of anesthesia    hypotensive after back surgery in 2006- reports on chart   . Depression   .  Dyslipidemia   . Fainted 04/21/06   AT [place]  . GERD (gastroesophageal reflux disease)   . Headache(784.0)   . Hearing loss   . HLD (hyperlipidemia)   . Hypertension   . Hypothyroidism   . ICD (implantable cardiac defibrillator) in place    pt has pacer/icd  . ICD (implantable cardiac defibrillator), biventricular, in situ   . LBBB (left bundle branch block)   . Memory loss   . Nonischemic cardiomyopathy ([place])   . Normal coronary arteries    s/p cardiac cath 2007  . Pacemaker    Guidant Device  . Syncope   . Systolic CHF ([place])   . Vertigo   . Wears glasses     Past Surgical History:  Procedure Laterality Date  . BACK  SURGERY     lumbar fusion   . BREAST SURGERY  2000   LUMP REMOVAL. STAGE 1 CANCER  . CARDIAC CATHETERIZATION    . CATARACT EXTRACTION    . EYE SURGERY    . IMPLANTABLE CARDIOVERTER DEFIBRILLATOR GENERATOR CHANGE N/A 12/18/2012   Procedure: IMPLANTABLE CARDIOVERTER DEFIBRILLATOR GENERATOR CHANGE;  Surgeon: [name], MD;  Location: [facility] CATH LAB;  Service: Cardiovascular;  Laterality: N/A;  . JOINT REPLACEMENT  06/14/01   right  . LUMBAR FUSION  2006  . MASS EXCISION  11/08/2011   Procedure: EXCISION MASS;  Surgeon: [name], MD;  Location: [facility] ORS;  Service: General;  Laterality: Left;  Excision Left Thigh Mass  . MASTECTOMY, PARTIAL  2008   GOT PACEMAKER AND DEFIB AT THAT TIME  . PACEMAKER INSERTION  04/23/06  . TOTAL KNEE ARTHROPLASTY  05/17/01   RIGHT KNEE  . TOTAL KNEE ARTHROPLASTY Left 11/29/2014   Procedure: TOTAL LEFT KNEE ARTHROPLASTY;  Surgeon: Paralee Cancel, MD;  Location: [facility] ORS;  Service: Orthopedics;  Laterality: Left;     reports that she has never smoked. She has never used smokeless tobacco. She reports that she does not drink alcohol or use drugs. Social History   Socioeconomic History  . Marital status: Married    Spouse name: Not on file  . Number of children: 1  . Years of education: Masters  . Highest education level: Not on file  Occupational History  . Occupation: Retired  Scientific laboratory technician  . Financial resource strain: Not hard at all  . Food insecurity:    Worry: Never true    Inability: Never true  . Transportation needs:    Medical: No    Non-medical: No  Tobacco Use  . Smoking status: Never Smoker  . Smokeless tobacco: Never Used  Substance and Sexual Activity  . Alcohol use: No  . Drug use: No  . Sexual activity: Not Currently    Birth control/protection: Post-menopausal  Lifestyle  . Physical activity:    Days per week: 4 days    Minutes per session: 30 min  . Stress: To some extent  Relationships  . Social connections:    Talks on phone:  More than three times a week    Gets together: Once a week    Attends religious service: More than 4 times per year    Active member of club or organization: Yes    Attends meetings of clubs or organizations: Never    Relationship status: Married  . Intimate partner violence:    Fear of current or ex partner: No    Emotionally abused: No    Physically abused: No    Forced sexual activity: No  Other Topics Concern  . Not on file  Social History Narrative   Lives at home with husband.   Right-handed.      As of 07/28/2014   Diet: No special diet   Caffeine: yes, Chocolate, tea and sodas    Married: YES, 1970   House: Yes, 2 stories, 2-3 persons live in home   Pets: No   Current/Past profession: Engineer, mining, Designer, jewellery    Exercise: Yes 2-3 x weekly   Living Will: Yes   DNR: No   POA/HPOA: No       Family History  Problem Relation Age of Onset  . Hypertension Mother   . Arthritis Mother   . Hypertension Father   . Hypertension Brother   . Hypertension Brother     Allergies  Allergen Reactions  . Iodine Shortness Of Breath    Iodine contrast, CHF , SOB  . Shellfish Allergy Shortness Of Breath  . Memantine     Malaise, fogginess/ couldn't think  . Aspirin Nausea And Vomiting  . Codeine Nausea And Vomiting    Outpatient Encounter Medications as of 08/30/2017  Medication Sig  . acetaminophen (TYLENOL) 325 MG tablet Take 2 tablets (650 mg total) by mouth every 6 (six) hours as needed for mild pain (or Fever >/= 101).  Marland Kitchen buPROPion (WELLBUTRIN XL) 150 MG 24 hr tablet Take 3 tablets (450 mg total) by mouth daily.  . cholecalciferol (VITAMIN D) 1000 UNITS tablet Take 1,000 Units by mouth daily.  Marland Kitchen docusate sodium (COLACE) 50 MG capsule Take 1 capsule (50 mg total) by mouth 2 (two) times daily.  Marland Kitchen levothyroxine (SYNTHROID, LEVOTHROID) 100 MCG tablet Take 1 tablet (100 mcg total) by mouth daily before breakfast.  . losartan (COZAAR) 100 MG tablet TAKE 1 TABLET ONCE  DAILY.  . Magnesium 250 MG TABS Take 250 mg by mouth daily.  . metoprolol tartrate (LOPRESSOR) 25 MG tablet TAKE 1 TABLET BY MOUTH  EVERY MORNING AND ONE-HALF  TABLET EVERY EVENING  . Omega-3 Fatty Acids (FISH OIL) 1200 MG CAPS Take 1,200 mg by mouth every morning.  Marland Kitchen omeprazole (PRILOSEC) 40 MG capsule TAKE 1 CAPSULE BY MOUTH  DAILY FOR STOMACH  . Oxcarbazepine (TRILEPTAL) 300 MG tablet Take 300 mg by mouth every morning.   . potassium chloride (K-DUR,KLOR-CON) 10 MEQ tablet TAKE 1 TABLET BY MOUTH  DAILY  . sertraline (ZOLOFT) 100 MG tablet Take 200 mg by mouth every morning.   Marland Kitchen spironolactone (ALDACTONE) 25 MG tablet TAKE 1 TABLET BY MOUTH  EVERY MORNING  . tiZANidine (ZANAFLEX) 4 MG tablet TAKE 1 TABLET EVERY 6 HOURS AS NEEDED FOR MUSCLE SPASM.  Marland Kitchen vitamin E 1000 UNIT capsule Take 1,000 Units by mouth daily.  . furosemide (LASIX) 40 MG tablet Take 0.5 tablets (20 mg total) by mouth daily.  . polyethylene glycol (MIRALAX / GLYCOLAX) packet Take 17 g by mouth daily as needed. (Patient not taking: Reported on 08/30/2017)  . rivastigmine (EXELON) 4.6 mg/24hr Place 1 patch (4.6 mg total) onto the skin daily. (Patient not taking: Reported on 08/30/2017)   No facility-administered encounter medications on file as of 08/30/2017.     Review of Systems:  Review of Systems  Unable to perform ROS: Other (memory loss)    Health Maintenance  Topic Date Due  . INFLUENZA VACCINE  11/07/2017  . MAMMOGRAM  03/06/2018  . TETANUS/TDAP  01/08/2023  . DEXA SCAN  Completed  . PNA vac Low Risk Adult  Completed  Physical Exam: Vitals:   08/30/17 1144  BP: 126/60  Pulse: 63  Resp: 18  Temp: 98.1 F (36.7 C)  TempSrc: Oral  SpO2: 97%  Weight: 125 lb 3.2 oz (56.8 kg)  Height: '4\' 10"'$  (1.473 m)   Body mass index is 26.17 kg/m. Physical Exam  Constitutional: She is oriented to person, place, and time. She appears well-developed and well-nourished.  HENT:  Mouth/Throat: Oropharynx is clear and  moist. No oropharyngeal exudate.  MMM; no oral thrush  Eyes: Pupils are equal, round, and reactive to light. No scleral icterus.  Neck: Neck supple. Carotid bruit is not present. No tracheal deviation present. No thyromegaly present.  Cardiovascular: Normal rate, regular rhythm and intact distal pulses. Exam reveals no gallop and no friction rub.  Murmur (1/6 SEM) heard. No LE edema b/l. no calf TTP.   Pulmonary/Chest: Effort normal and breath sounds normal. No stridor. No respiratory distress. She has no wheezes. She has no rales.  Abdominal: Soft. Normal appearance and bowel sounds are normal. She exhibits distension. She exhibits no mass. There is no hepatomegaly. There is tenderness. There is no rigidity, no rebound and no guarding. No hernia.  Musculoskeletal: She exhibits edema and tenderness.       Lumbar back: She exhibits decreased range of motion, tenderness and spasm.       Back:  Lymphadenopathy:    She has no cervical adenopathy.  Neurological: She is alert and oriented to person, place, and time. She has normal reflexes. Gait abnormal.  Skin: Skin is warm and dry. No rash noted.  Psychiatric: She has a normal mood and affect. Her behavior is normal. Judgment and thought content normal.    Labs reviewed: Basic Metabolic Panel: Recent Labs    08/31/16 1050 12/07/16 1215 03/27/17 1534  NA 141 143 141  K 4.5 5.0 4.2  CL 107 107 106  CO2 '27 24 31  '$ GLUCOSE 88 73 71  BUN 31* 28* 28*  CREATININE 1.00* 1.21* 0.81  CALCIUM 10.2 9.9 10.2  TSH 2.31 1.09 2.04   Liver Function Tests: Recent Labs    08/31/16 1050 12/07/16 1215 03/27/17 1534  AST '23 23 24  '$ ALT 27 26 32*  ALKPHOS 67 66  --   BILITOT 0.3 0.2 0.2  PROT 6.7 6.7 6.5  ALBUMIN 3.9 4.1  --    No results for input(s): LIPASE, AMYLASE in the last 8760 hours. No results for input(s): AMMONIA in the last 8760 hours. CBC: Recent Labs    08/31/16 1050  WBC 9.2  NEUTROABS 6,532  HGB 12.2  HCT 38.7  MCV 88.2   PLT 132*   Lipid Panel: Recent Labs    03/27/17 1534  CHOL 168  HDL 65  LDLCALC 82  TRIG 118  CHOLHDL 2.6   No results found for: HGBA1C  Procedures since last visit: No results found.  Assessment/Plan     ICD-10-CM   1. Pain in both thighs M79.651 DG Lumbar Spine Complete   M79.652   2. Chronic bilateral low back pain without sciatica M54.5 DG Lumbar Spine Complete   G89.29   3. History of back surgery Z98.890 DG Lumbar Spine Complete  4. High risk medication use Z79.899 BMP with eGFR([facility])    ALT    CBC with Differential/Platelets  5. Constipation, unspecified constipation type K59.00   6. Hypothyroidism due to acquired atrophy of thyroid E03.4 TSH    T4, Free  7. Mixed hyperlipidemia E78.2 Lipid Panel  8. Age-related osteoporosis  without current pathological fracture M81.0   9. Mild cognitive impairment G31.84   10. Recurrent major depressive disorder, in partial remission ([place]) F33.41   11. Benign hypertension I10 CBC with Differential/Platelets   Will call with lab/xray results  Continue current medications as ordered  May need to see a specialist to further assess back pain  Follow up in 5 mos for thyroid, memory loss, HF, MDD   Sharief Wainwright S. Perlie Gold  [facility] [address] [place], [place] [address] [phone] Cell (Monday-Friday 8 AM - 5 PM) [phone] After 5 PM and follow prompts

## 2017-08-30 NOTE — Patient Instructions (Addendum)
Will call with lab/xray results  Continue current medications as ordered  May need to see a specialist to further assess back pain  Follow up in 5 mos for thyroid, memory loss, HF, MDD

## 2017-09-01 DIAGNOSIS — K5900 Constipation, unspecified: Secondary | ICD-10-CM | POA: Insufficient documentation

## 2017-09-01 DIAGNOSIS — M810 Age-related osteoporosis without current pathological fracture: Secondary | ICD-10-CM | POA: Insufficient documentation

## 2017-09-01 DIAGNOSIS — Z79899 Other long term (current) drug therapy: Secondary | ICD-10-CM | POA: Insufficient documentation

## 2017-09-01 DIAGNOSIS — E034 Atrophy of thyroid (acquired): Secondary | ICD-10-CM | POA: Insufficient documentation

## 2017-09-01 DIAGNOSIS — E782 Mixed hyperlipidemia: Secondary | ICD-10-CM | POA: Insufficient documentation

## 2017-09-01 DIAGNOSIS — Z9889 Other specified postprocedural states: Secondary | ICD-10-CM | POA: Insufficient documentation

## 2017-09-17 NOTE — Progress Notes (Signed)
Established Intermittent Claudication   History of Present Illness   [name] is a 84 y.o. ([birth year]) female who presents with chief complaint: pain across the metatarsals of R foot.  The patient's symptoms have not progressed.  The patient's symptoms previously have neuropathic in character.  She is getting epidural injection.  The patient's treatment regimen currently included: maximal medical management.  The patient's PMH, PSH, SH, and FamHx were reviewed on 09/18/17 are unchanged from 06/12/17.  Current Outpatient Medications  Medication Sig Dispense Refill  . acetaminophen (TYLENOL) 325 MG tablet Take 2 tablets (650 mg total) by mouth every 6 (six) hours as needed for mild pain (or Fever >/= 101).    Marland Kitchen buPROPion (WELLBUTRIN XL) 150 MG 24 hr tablet Take 3 tablets (450 mg total) by mouth daily. 270 tablet 3  . cholecalciferol (VITAMIN D) 1000 UNITS tablet Take 1,000 Units by mouth daily.    Marland Kitchen docusate sodium (COLACE) 50 MG capsule Take 1 capsule (50 mg total) by mouth 2 (two) times daily. 60 capsule 0  . furosemide (LASIX) 40 MG tablet Take 0.5 tablets (20 mg total) by mouth daily. 45 tablet 3  . levothyroxine (SYNTHROID, LEVOTHROID) 100 MCG tablet Take 1 tablet (100 mcg total) by mouth daily before breakfast. 90 tablet 3  . losartan (COZAAR) 100 MG tablet TAKE 1 TABLET ONCE DAILY. 30 tablet 0  . Magnesium 250 MG TABS Take 250 mg by mouth daily.    . metoprolol tartrate (LOPRESSOR) 25 MG tablet TAKE 1 TABLET BY MOUTH  EVERY MORNING AND ONE-HALF  TABLET EVERY EVENING 135 tablet 1  . Omega-3 Fatty Acids (FISH OIL) 1200 MG CAPS Take 1,200 mg by mouth every morning.    Marland Kitchen omeprazole (PRILOSEC) 40 MG capsule TAKE 1 CAPSULE BY MOUTH  DAILY FOR STOMACH 90 capsule 1  . Oxcarbazepine (TRILEPTAL) 300 MG tablet Take 300 mg by mouth every morning.     . polyethylene glycol (MIRALAX / GLYCOLAX) packet Take 17 g by mouth daily as needed. (Patient not taking: Reported on 08/30/2017) 10 each 0  .  potassium chloride (K-DUR,KLOR-CON) 10 MEQ tablet TAKE 1 TABLET BY MOUTH  DAILY 90 tablet 1  . rivastigmine (EXELON) 4.6 mg/24hr Place 1 patch (4.6 mg total) onto the skin daily. (Patient not taking: Reported on 08/30/2017) 30 patch 6  . sertraline (ZOLOFT) 100 MG tablet Take 200 mg by mouth every morning.     Marland Kitchen spironolactone (ALDACTONE) 25 MG tablet TAKE 1 TABLET BY MOUTH  EVERY MORNING 90 tablet 1  . tiZANidine (ZANAFLEX) 4 MG tablet TAKE 1 TABLET EVERY 6 HOURS AS NEEDED FOR MUSCLE SPASM. 60 tablet 2  . vitamin E 1000 UNIT capsule Take 1,000 Units by mouth daily.     No current facility-administered medications for this visit.     On ROS today: continued R forefoot pain, No rest pain or ulcers.   Physical Examination   Vitals:   09/18/17 1136  BP: 109/70  Pulse: 60  SpO2: 97%  Weight: 123 lb 12.8 oz (56.2 kg)  Height: 4\' 11"  (1.499 m)   Body mass index is 25 kg/m.  General Alert, O x 3, WD, NAD  Pulmonary Sym exp, good B air movt, CTA B  Cardiac RRR, Nl S1, S2, no Murmurs, No rubs, No S3,S4  Vascular Vessel Right Left  Radial Palpable Palpable  Brachial Palpable Palpable  Carotid Palpable, No Bruit Palpable, No Bruit  Aorta Not palpable N/A  Femoral Palpable Palpable  Popliteal Not palpable Not palpable  PT Not palpable Not palpable  DP Faintly palpable Faintly palpable    Gastro- intestinal soft, non-distended, non-tender to palpation, No guarding or rebound, no HSM, no masses, no CVAT B, No palpable prominent aortic pulse,    Musculo- skeletal M/S 5/5 throughout  , Extremities without ischemic changes  , Non-pitting edema present: R 2+, L1+, Varicosities present: R>L, No Lipodermatosclerosis present  Neurologic Pain and light touch intact in extremities , Motor exam as listed above    Non-Invasive Vascular imaging   ABI (09/18/2017)  R:   ABI: 1.06 (1.03),   PT: tri  DP: bi  TBI:  0.51  L:   ABI: 0.55 (0.56),   PT: mono  DP: bi  TBI:  0.29   Medical Decision Making   [name] is a 84 y.o. female who presents with:  Atypical R leg pain, minimal PAD in R leg, moderate L leg PAD, CVI (C3)   Pt's pain is NOT vasculogenic in nature as the sx R leg has minimal PAD.  Her sx are not compatible CVI.  Based on the patient's vascular studies and examination, I have offered the patient: q6 Month BLE ABI.  I recommended: OTC compression stockings for the patient, as I suspect she can't get the 20-30 mm Hg stockings on.  I discussed in depth with the patient the nature of atherosclerosis, and emphasized the importance of maximal medical management including strict control of blood pressure, blood glucose, and lipid levels, antiplatelet agents, obtaining regular exercise, and cessation of smoking.    The patient is aware that without maximal medical management the underlying atherosclerotic disease process will progress, limiting the benefit of any interventions.  The patient is currently not on on statin as not medically indicated.   The patient is currently not on an anti-platelet due to allergy.  Thank you for allowing [place] to participate in this patient's care.   Adele Barthel, MD, FACS [facility] Office: [phone] Pager: [phone]

## 2017-09-18 ENCOUNTER — Ambulatory Visit (HOSPITAL_COMMUNITY)
Admission: RE | Admit: 2017-09-18 | Discharge: 2017-09-18 | Disposition: A | Discharge status: Home / Self Care | Payer: Medicare Other | Source: Ambulatory Visit | Attending: Vascular Surgery | Admitting: Vascular Surgery

## 2017-09-18 ENCOUNTER — Ambulatory Visit (INDEPENDENT_AMBULATORY_CARE_PROVIDER_SITE_OTHER): Payer: Medicare Other | Admitting: Vascular Surgery

## 2017-09-18 ENCOUNTER — Encounter: Payer: Self-pay | Admitting: Vascular Surgery

## 2017-09-18 VITALS — BP 109/70 | HR 60 | Ht 59.0 in | Wt 123.8 lb

## 2017-09-18 DIAGNOSIS — I70213 Atherosclerosis of native arteries of extremities with intermittent claudication, bilateral legs: Secondary | ICD-10-CM

## 2017-09-18 DIAGNOSIS — I10 Essential (primary) hypertension: Secondary | ICD-10-CM | POA: Diagnosis not present

## 2017-09-25 ENCOUNTER — Other Ambulatory Visit: Payer: Self-pay

## 2017-09-25 DIAGNOSIS — I70213 Atherosclerosis of native arteries of extremities with intermittent claudication, bilateral legs: Secondary | ICD-10-CM

## 2017-10-04 ENCOUNTER — Ambulatory Visit: Payer: Medicare Other | Admitting: Nurse Practitioner

## 2017-10-07 ENCOUNTER — Ambulatory Visit (INDEPENDENT_AMBULATORY_CARE_PROVIDER_SITE_OTHER): Payer: Medicare Other | Admitting: Nurse Practitioner

## 2017-10-07 ENCOUNTER — Encounter: Payer: Self-pay | Admitting: Nurse Practitioner

## 2017-10-07 VITALS — BP 104/68 | HR 67 | Temp 97.9°F | Ht 59.0 in | Wt 124.0 lb

## 2017-10-07 DIAGNOSIS — M79661 Pain in right lower leg: Secondary | ICD-10-CM | POA: Diagnosis not present

## 2017-10-07 DIAGNOSIS — M7989 Other specified soft tissue disorders: Secondary | ICD-10-CM | POA: Diagnosis not present

## 2017-10-07 NOTE — Progress Notes (Signed)
Careteam: Patient Care Team: [name], DO as PCP - General (Internal Medicine) [name], MD as Consulting Physician (General Surgery) Paralee Cancel, MD as Consulting Physician (Orthopedic Surgery) Marica Otter, [place] (Optometry) Marcial Pacas, MD as Consulting Physician (Neurology) [name], MD as Consulting Physician (Cardiology)  Advanced Directive information Does Patient Have a Medical Advance Directive?: Yes, Type of Advance Directive: Out of facility DNR (pink MOST or yellow form);Living will, Pre-existing out of facility DNR order (yellow form or pink MOST form): Yellow form placed in chart (order not valid for inpatient use)  Allergies  Allergen Reactions  . Iodine Shortness Of Breath    Iodine contrast, CHF , SOB  . Shellfish Allergy Shortness Of Breath  . Memantine     Malaise, fogginess/ couldn't think  . Aspirin Nausea And Vomiting  . Codeine Nausea And Vomiting    Chief Complaint  Patient presents with  . Acute Visit    Pt is being seen due to right lower leg pain and swelling for several weeks. Pt states that pain started on the inner side of the foot and moved to mid-way up her calf.      HPI: Patient is a 84 y.o. female seen in the office today due to right lower leg pain and swelling. Started about 2-3 weeks ago and leg was swollen but now had improved. Best she has seen it since it started. Not painful unless she presses on it. Reports a "hurting" pain.  Pt with hx of bilateral knee pain following with sports medicine, atherosclerosis of both lower extremities, chronic low back pain with claudication getting epidural injections.  Does not effect gait/walking Reports occasional tingling.  Left leg not swollen.  Increase redness in the evenings.  No changes in medication.  No long trips. Went to the vascular center when it first started and ABIs were done and minimal PAD noted in right leg. Was not having the swelling when it first started but  reported pain to her inner foot.  No injury.   Review of Systems:  Review of Systems  Constitutional: Negative for chills and fever.  Respiratory: Negative for cough and shortness of breath.   Cardiovascular: Positive for leg swelling. Negative for chest pain and palpitations.  Musculoskeletal: Positive for joint pain and myalgias. Negative for falls.  Neurological: Positive for tingling (to LE).    Past Medical History:  Diagnosis Date  . Arthritis   . Cancer [facility])    left breast cancer   . CHF (congestive heart failure) ([place])    PACEMAKER & DEFIB  . Complication of anesthesia    hypotensive after back surgery in 2006- reports on chart   . Depression   . Dyslipidemia   . Fainted 04/21/06   AT [place]  . GERD (gastroesophageal reflux disease)   . Headache(784.0)   . Hearing loss   . HLD (hyperlipidemia)   . Hypertension   . Hypothyroidism   . ICD (implantable cardiac defibrillator) in place    pt has pacer/icd  . ICD (implantable cardiac defibrillator), biventricular, in situ   . LBBB (left bundle branch block)   . Memory loss   . Nonischemic cardiomyopathy ([place])   . Normal coronary arteries    s/p cardiac cath 2007  . Pacemaker    Guidant Device  . Syncope   . Systolic CHF ([place])   . Vertigo   . Wears glasses    Past Surgical History:  Procedure Laterality Date  . BACK SURGERY  lumbar fusion   . BREAST SURGERY  2000   LUMP REMOVAL. STAGE 1 CANCER  . CARDIAC CATHETERIZATION    . CATARACT EXTRACTION    . EYE SURGERY    . IMPLANTABLE CARDIOVERTER DEFIBRILLATOR GENERATOR CHANGE N/A 12/18/2012   Procedure: IMPLANTABLE CARDIOVERTER DEFIBRILLATOR GENERATOR CHANGE;  Surgeon: [name], MD;  Location: [facility] CATH LAB;  Service: Cardiovascular;  Laterality: N/A;  . JOINT REPLACEMENT  06/14/01   right  . LUMBAR FUSION  2006  . MASS EXCISION  11/08/2011   Procedure: EXCISION MASS;  Surgeon: [name], MD;  Location: [facility] ORS;  Service: General;  Laterality: Left;   Excision Left Thigh Mass  . MASTECTOMY, PARTIAL  2008   GOT PACEMAKER AND DEFIB AT THAT TIME  . PACEMAKER INSERTION  04/23/06  . TOTAL KNEE ARTHROPLASTY  05/17/01   RIGHT KNEE  . TOTAL KNEE ARTHROPLASTY Left 11/29/2014   Procedure: TOTAL LEFT KNEE ARTHROPLASTY;  Surgeon: Paralee Cancel, MD;  Location: [facility] ORS;  Service: Orthopedics;  Laterality: Left;   Social History:   reports that she has never smoked. She has never used smokeless tobacco. She reports that she does not drink alcohol or use drugs.  Family History  Problem Relation Age of Onset  . Hypertension Mother   . Arthritis Mother   . Hypertension Father   . Hypertension Brother   . Hypertension Brother     Medications: Patient's Medications  New Prescriptions   No medications on file  Previous Medications   ACETAMINOPHEN (TYLENOL) 325 MG TABLET    Take 2 tablets (650 mg total) by mouth every 6 (six) hours as needed for mild pain (or Fever >/= 101).   BUPROPION (WELLBUTRIN XL) 150 MG 24 HR TABLET    Take 3 tablets (450 mg total) by mouth daily.   CHOLECALCIFEROL (VITAMIN D) 1000 UNITS TABLET    Take 1,000 Units by mouth daily.   FUROSEMIDE (LASIX) 40 MG TABLET    Take 20 mg by mouth daily.   LEVOTHYROXINE (SYNTHROID, LEVOTHROID) 100 MCG TABLET    Take 1 tablet (100 mcg total) by mouth daily before breakfast.   LOSARTAN (COZAAR) 100 MG TABLET    TAKE 1 TABLET ONCE DAILY.   MAGNESIUM 250 MG TABS    Take 250 mg by mouth daily.   METOPROLOL TARTRATE (LOPRESSOR) 25 MG TABLET    TAKE 1 TABLET BY MOUTH  EVERY MORNING AND ONE-HALF  TABLET EVERY EVENING   OMEGA-3 FATTY ACIDS (FISH OIL) 1200 MG CAPS    Take 1,200 mg by mouth every morning.   OMEPRAZOLE (PRILOSEC) 40 MG CAPSULE    TAKE 1 CAPSULE BY MOUTH  DAILY FOR STOMACH   OXCARBAZEPINE (TRILEPTAL) 300 MG TABLET    Take 300 mg by mouth every morning.    POTASSIUM CHLORIDE (K-DUR,KLOR-CON) 10 MEQ TABLET    TAKE 1 TABLET BY MOUTH  DAILY   RIVASTIGMINE (EXELON) 4.6 MG/24HR    Place 1 patch  (4.6 mg total) onto the skin daily.   SERTRALINE (ZOLOFT) 100 MG TABLET    Take 200 mg by mouth every morning.    SPIRONOLACTONE (ALDACTONE) 25 MG TABLET    TAKE 1 TABLET BY MOUTH  EVERY MORNING   TIZANIDINE (ZANAFLEX) 4 MG TABLET    TAKE 1 TABLET EVERY 6 HOURS AS NEEDED FOR MUSCLE SPASM.   VITAMIN E 1000 UNIT CAPSULE    Take 1,000 Units by mouth daily.  Modified Medications   No medications on file  Discontinued Medications  DOCUSATE SODIUM (COLACE) 50 MG CAPSULE    Take 1 capsule (50 mg total) by mouth 2 (two) times daily.   FUROSEMIDE (LASIX) 40 MG TABLET    Take 0.5 tablets (20 mg total) by mouth daily.   POLYETHYLENE GLYCOL (MIRALAX / GLYCOLAX) PACKET    Take 17 g by mouth daily as needed.     Physical Exam:  Vitals:   10/07/17 1021  BP: 104/68  Pulse: 67  Temp: 97.9 F (36.6 C)  TempSrc: Oral  SpO2: 96%  Weight: 124 lb (56.2 kg)  Height: 4\' 11"  (1.499 m)   Body mass index is 25.04 kg/m.  Physical Exam  Constitutional: She appears well-developed and well-nourished.  Cardiovascular: Normal rate, regular rhythm and normal heart sounds.  Pulmonary/Chest: Effort normal and breath sounds normal.  Abdominal: Soft. Bowel sounds are normal.  Musculoskeletal: Normal range of motion.  1+ edema noted to right LE below the knee and ankle.  Tenderness noted to calf bilaterally, ankle and dorsal foot to right   Neurological: She is alert.  Skin: Skin is warm and dry.    Labs reviewed: Basic Metabolic Panel: Recent Labs    12/07/16 1215 03/27/17 1534 08/30/17 1251  NA 143 141 141  K 5.0 4.2 5.1  CL 107 106 104  CO2 24 31 30   GLUCOSE 73 71 77  BUN 28* 28* 27*  CREATININE 1.21* 0.81 0.99*  CALCIUM 9.9 10.2 10.6*  TSH 1.09 2.04 2.21   Liver Function Tests: Recent Labs    12/07/16 1215 03/27/17 1534 08/30/17 1251  AST 23 24  --   ALT 26 32* 17  ALKPHOS 66  --   --   BILITOT 0.2 0.2  --   PROT 6.7 6.5  --   ALBUMIN 4.1  --   --    No results for input(s):  LIPASE, AMYLASE in the last 8760 hours. No results for input(s): AMMONIA in the last 8760 hours. CBC: Recent Labs    08/30/17 1251  WBC 8.6  NEUTROABS 5,289  HGB 12.8  HCT 38.3  MCV 85.1  PLT 134*   Lipid Panel: Recent Labs    03/27/17 1534 08/30/17 1251  CHOL 168 181  HDL 65 56  LDLCALC 82 98  TRIG 118 175*  CHOLHDL 2.6 3.2   TSH: Recent Labs    12/07/16 1215 03/27/17 1534 08/30/17 1251  TSH 1.09 2.04 2.21   A1C: No results found for: HGBA1C   Assessment/Plan 1. Pain and swelling of lower leg, right -overall swelling and pain has improved.  -has seen vascular and orthopedic with same complaint.  Vascular did not feel like pain was vasculogenic and there was minimal PAD noted on ABIs -she is s/p bilateral knee replacement as well and still having chronic pain at times.  - VAS [place] LOWER EXTREMITY VENOUS (DVT) to rule out DVT  Next appt: 10/23/2017, sooner if needed Norena Bratton K. [place], [facility] [phone]

## 2017-10-07 NOTE — Patient Instructions (Addendum)
Will get a venous ultrasound of the right leg.

## 2017-10-09 ENCOUNTER — Ambulatory Visit (HOSPITAL_COMMUNITY)
Admission: RE | Admit: 2017-10-09 | Discharge: 2017-10-09 | Disposition: A | Discharge status: Home / Self Care | Payer: Medicare Other | Source: Ambulatory Visit | Attending: Cardiology | Admitting: Cardiology

## 2017-10-09 DIAGNOSIS — M7989 Other specified soft tissue disorders: Secondary | ICD-10-CM | POA: Diagnosis not present

## 2017-10-09 DIAGNOSIS — M79661 Pain in right lower leg: Secondary | ICD-10-CM

## 2017-10-23 ENCOUNTER — Encounter: Payer: Self-pay | Admitting: Podiatry

## 2017-10-23 ENCOUNTER — Ambulatory Visit: Payer: Medicare Other | Admitting: Internal Medicine

## 2017-10-23 ENCOUNTER — Encounter: Payer: Self-pay | Admitting: Internal Medicine

## 2017-10-23 ENCOUNTER — Ambulatory Visit (INDEPENDENT_AMBULATORY_CARE_PROVIDER_SITE_OTHER): Payer: Medicare Other | Admitting: Podiatry

## 2017-10-23 VITALS — BP 134/70 | HR 60 | Temp 97.6°F | Resp 18 | Ht 59.0 in | Wt 125.2 lb

## 2017-10-23 DIAGNOSIS — M7989 Other specified soft tissue disorders: Secondary | ICD-10-CM

## 2017-10-23 DIAGNOSIS — M810 Age-related osteoporosis without current pathological fracture: Secondary | ICD-10-CM

## 2017-10-23 DIAGNOSIS — I83893 Varicose veins of bilateral lower extremities with other complications: Secondary | ICD-10-CM | POA: Diagnosis not present

## 2017-10-23 DIAGNOSIS — M79661 Pain in right lower leg: Secondary | ICD-10-CM

## 2017-10-23 DIAGNOSIS — M779 Enthesopathy, unspecified: Secondary | ICD-10-CM

## 2017-10-23 MED ORDER — TRIAMCINOLONE ACETONIDE 10 MG/ML IJ SUSP
10.0000 mg | Freq: Once | INTRAMUSCULAR | Status: AC
  Administered 2017-10-23: 10 mg

## 2017-10-23 MED ORDER — DENOSUMAB 60 MG/ML ~~LOC~~ SOSY
60.0000 mg | PREFILLED_SYRINGE | Freq: Once | SUBCUTANEOUS | Status: AC
  Administered 2017-10-23: 60 mg via SUBCUTANEOUS

## 2017-10-23 NOTE — Progress Notes (Signed)
Patient ID: [name], female   DOB: [birth year], 84 y.o.   MRN: [id number]   [facility] OFFICE  Provider: DR Arletha Grippe  Code Status:  Goals of Care:  Advanced Directives 10/07/2017  Does Patient Have a Medical Advance Directive? Yes  Type of Advance Directive Out of facility DNR (pink MOST or yellow form);Living will  Does patient want to make changes to medical advance directive? -  Copy of [place] in Chart? -  Pre-existing out of facility DNR order (yellow form or pink MOST form) Yellow form placed in chart (order not valid for inpatient use)     Chief Complaint  Patient presents with  . Medical Management of Chronic Issues    Question regarding Prolia injection    HPI: Patient is a 84 y.o. female seen today for an acute visit to discuss prolia injections. She would pay $220 out-of-pocket. She has GERD which would increase adverse events if she took an oral bisphosphonate. She also has dementia and compliance may be an issue with oral weekly medication. T score -2.8 on Oct 2018 DXA.   She is c/a swelling in right foot-->mid leg x several weeks. She had a vascular US that was neg for DVT. ABI left 0.55 (prev 0.56); right 1.06 (prev 1.03) in June 2019. She was told by vascular sx that she had varicose veins.  She is a poor historian due to memory loss. Hx obtained from chart.  Past Medical History:  Diagnosis Date  . Arthritis   . Cancer [facility])    left breast cancer   . CHF (congestive heart failure) ([place])    PACEMAKER & DEFIB  . Complication of anesthesia    hypotensive after back surgery in 2006- reports on chart   . Depression   . Dyslipidemia   . Fainted 04/21/06   AT [place]  . GERD (gastroesophageal reflux disease)   . Headache(784.0)   . Hearing loss   . HLD (hyperlipidemia)   . Hypertension   . Hypothyroidism   . ICD (implantable cardiac defibrillator) in place    pt has pacer/icd  . ICD (implantable cardiac defibrillator), biventricular,  in situ   . LBBB (left bundle branch block)   . Memory loss   . Nonischemic cardiomyopathy ([place])   . Normal coronary arteries    s/p cardiac cath 2007  . Pacemaker    Guidant Device  . Syncope   . Systolic CHF ([place])   . Vertigo   . Wears glasses     Past Surgical History:  Procedure Laterality Date  . BACK SURGERY     lumbar fusion   . BREAST SURGERY  2000   LUMP REMOVAL. STAGE 1 CANCER  . CARDIAC CATHETERIZATION    . CATARACT EXTRACTION    . EYE SURGERY    . IMPLANTABLE CARDIOVERTER DEFIBRILLATOR GENERATOR CHANGE N/A 12/18/2012   Procedure: IMPLANTABLE CARDIOVERTER DEFIBRILLATOR GENERATOR CHANGE;  Surgeon: [name], MD;  Location: [facility] CATH LAB;  Service: Cardiovascular;  Laterality: N/A;  . JOINT REPLACEMENT  06/14/01   right  . LUMBAR FUSION  2006  . MASS EXCISION  11/08/2011   Procedure: EXCISION MASS;  Surgeon: [name], MD;  [facility];  Service: General;  Laterality: Left;  Excision Left Thigh Mass  . MASTECTOMY, PARTIAL  2008   GOT PACEMAKER AND DEFIB AT THAT TIME  . PACEMAKER INSERTION  04/23/06  . TOTAL KNEE ARTHROPLASTY  05/17/01   RIGHT KNEE  . TOTAL KNEE  ARTHROPLASTY Left 11/29/2014   Procedure: TOTAL LEFT KNEE ARTHROPLASTY;  Surgeon: Paralee Cancel, MD;  Location: [facility] ORS;  Service: Orthopedics;  Laterality: Left;     reports that she has never smoked. She has never used smokeless tobacco. She reports that she does not drink alcohol or use drugs. Social History   Socioeconomic History  . Marital status: Married    Spouse name: Not on file  . Number of children: 1  . Years of education: Masters  . Highest education level: Not on file  Occupational History  . Occupation: Retired  Scientific laboratory technician  . Financial resource strain: Not hard at all  . Food insecurity:    Worry: Never true    Inability: Never true  . Transportation needs:    Medical: No    Non-medical: No  Tobacco Use  . Smoking status: Never Smoker  . Smokeless tobacco: Never Used    Substance and Sexual Activity  . Alcohol use: No  . Drug use: No  . Sexual activity: Not Currently    Birth control/protection: Post-menopausal  Lifestyle  . Physical activity:    Days per week: 4 days    Minutes per session: 30 min  . Stress: To some extent  Relationships  . Social connections:    Talks on phone: More than three times a week    Gets together: Once a week    Attends religious service: More than 4 times per year    Active member of club or organization: Yes    Attends meetings of clubs or organizations: Never    Relationship status: Married  . Intimate partner violence:    Fear of current or ex partner: No    Emotionally abused: No    Physically abused: No    Forced sexual activity: No  Other Topics Concern  . Not on file  Social History Narrative   Lives at home with husband.   Right-handed.      As of 07/28/2014   Diet: No special diet   Caffeine: yes, Chocolate, tea and sodas    Married: YES, 1970   House: Yes, 2 stories, 2-3 persons live in home   Pets: No   Current/Past profession: Engineer, mining, Designer, jewellery    Exercise: Yes 2-3 x weekly   Living Will: Yes   DNR: No   POA/HPOA: No       Family History  Problem Relation Age of Onset  . Hypertension Mother   . Arthritis Mother   . Hypertension Father   . Hypertension Brother   . Hypertension Brother     Allergies  Allergen Reactions  . Iodine Shortness Of Breath    Iodine contrast, CHF , SOB  . Shellfish Allergy Shortness Of Breath  . Memantine     Malaise, fogginess/ couldn't think  . Aspirin Nausea And Vomiting  . Codeine Nausea And Vomiting    Outpatient Encounter Medications as of 10/23/2017  Medication Sig  . acetaminophen (TYLENOL) 325 MG tablet Take 2 tablets (650 mg total) by mouth every 6 (six) hours as needed for mild pain (or Fever >/= 101).  Marland Kitchen buPROPion (WELLBUTRIN XL) 150 MG 24 hr tablet Take 3 tablets (450 mg total) by mouth daily.  . cholecalciferol (VITAMIN  D) 1000 UNITS tablet Take 1,000 Units by mouth daily.  . furosemide (LASIX) 40 MG tablet Take 20 mg by mouth daily.  Marland Kitchen levothyroxine (SYNTHROID, LEVOTHROID) 100 MCG tablet Take 1 tablet (100 mcg total) by mouth daily before  breakfast.  . Magnesium 250 MG TABS Take 250 mg by mouth daily.  . metoprolol tartrate (LOPRESSOR) 25 MG tablet TAKE 1 TABLET BY MOUTH  EVERY MORNING AND ONE-HALF  TABLET EVERY EVENING  . NONFORMULARY OR COMPOUNDED ITEM APPLY 1-2 GMS TO AFFECTED AREA 3-4 TIMES DAILY AS NEEDED FOR PAIN. THIS IS A COMPOUNDED CREAM.1 PUMP EQUALS 1 GRAM.  . Omega-3 Fatty Acids (FISH OIL) 1200 MG CAPS Take 1,200 mg by mouth every morning.  Marland Kitchen omeprazole (PRILOSEC) 40 MG capsule TAKE 1 CAPSULE BY MOUTH  DAILY FOR STOMACH  . Oxcarbazepine (TRILEPTAL) 300 MG tablet Take 300 mg by mouth every morning.   . potassium chloride (K-DUR,KLOR-CON) 10 MEQ tablet TAKE 1 TABLET BY MOUTH  DAILY  . spironolactone (ALDACTONE) 25 MG tablet TAKE 1 TABLET BY MOUTH  EVERY MORNING  . tiZANidine (ZANAFLEX) 4 MG tablet TAKE 1 TABLET EVERY 6 HOURS AS NEEDED FOR MUSCLE SPASM.  Marland Kitchen vitamin E 1000 UNIT capsule Take 1,000 Units by mouth daily.  . [DISCONTINUED] losartan (COZAAR) 100 MG tablet TAKE 1 TABLET ONCE DAILY. (Patient not taking: Reported on 10/07/2017)  . [DISCONTINUED] rivastigmine (EXELON) 4.6 mg/24hr Place 1 patch (4.6 mg total) onto the skin daily.  . [DISCONTINUED] sertraline (ZOLOFT) 100 MG tablet Take 200 mg by mouth every morning.    No facility-administered encounter medications on file as of 10/23/2017.     Review of Systems:  Review of Systems  Unable to perform ROS: Dementia    Health Maintenance  Topic Date Due  . INFLUENZA VACCINE  11/07/2017  . MAMMOGRAM  03/06/2018  . TETANUS/TDAP  01/08/2023  . DEXA SCAN  Completed  . PNA vac Low Risk Adult  Completed    Physical Exam: Vitals:   10/23/17 1121  BP: 134/70  Pulse: 60  Resp: 18  Temp: 97.6 F (36.4 C)  TempSrc: Oral  SpO2: 96%  Weight:  125 lb 3.2 oz (56.8 kg)  Height: 4\' 11"  (1.499 m)   Body mass index is 25.29 kg/m. Physical Exam  Constitutional: She appears well-developed and well-nourished.  Cardiovascular:  +1 pitting distal RLE edema. (+) lateral calf TTP  Musculoskeletal: She exhibits edema (small and large joints).  Neurological: She is alert.  Skin: Skin is warm and dry. No rash noted.  Psychiatric: She has a normal mood and affect. Her behavior is normal.    Labs reviewed: Basic Metabolic Panel: Recent Labs    12/07/16 1215 03/27/17 1534 08/30/17 1251  NA 143 141 141  K 5.0 4.2 5.1  CL 107 106 104  CO2 24 31 30   GLUCOSE 73 71 77  BUN 28* 28* 27*  CREATININE 1.21* 0.81 0.99*  CALCIUM 9.9 10.2 10.6*  TSH 1.09 2.04 2.21   Liver Function Tests: Recent Labs    12/07/16 1215 03/27/17 1534 08/30/17 1251  AST 23 24  --   ALT 26 32* 17  ALKPHOS 66  --   --   BILITOT 0.2 0.2  --   PROT 6.7 6.5  --   ALBUMIN 4.1  --   --    No results for input(s): LIPASE, AMYLASE in the last 8760 hours. No results for input(s): AMMONIA in the last 8760 hours. CBC: Recent Labs    08/30/17 1251  WBC 8.6  NEUTROABS 5,289  HGB 12.8  HCT 38.3  MCV 85.1  PLT 134*   Lipid Panel: Recent Labs    03/27/17 1534 08/30/17 1251  CHOL 168 181  HDL 65 56  LDLCALC  82 98  TRIG 118 175*  CHOLHDL 2.6 3.2   No results found for: HGBA1C  Procedures since last visit: No results found.  Assessment/Plan   ICD-10-CM   1. Age-related osteoporosis without current pathological fracture M81.0 denosumab (PROLIA) injection 60 mg  2. Pain and swelling of lower leg, right M79.661    M79.89   3. Varicose veins of both legs with edema I83.893    swelling in R>L leg   RECOMMEND YOU CONTACT DR CHEN'S OFFICE TO DETERMINE IF THERE ARE ANY VARICOSE VEIN TREATMENTS HE CAN OFFER YOU  Follow up with Dr Bridgett Larsson in Dec as scheduled  Follow up with other specialists as scheduled  Continue current medications as  ordered  [place]  Follow up as scheduled in Oct 2019.    Emnet Monk S. Perlie Gold  [facility] [address] [place], [place] [address] [phone] Cell (Monday-Friday 8 AM - 5 PM) [phone] After 5 PM and follow prompts

## 2017-10-23 NOTE — Patient Instructions (Addendum)
RECOMMEND YOU CONTACT DR CHEN'S OFFICE TO DETERMINE IF THERE ARE ANY VARICOSE VEIN TREATMENTS HE CAN OFFER YOU  Follow up with Dr Bridgett Larsson in Dec as scheduled  Follow up with other specialists as scheduled  Continue current medications as ordered  [place]  Follow up as scheduled in Oct 2019.

## 2017-10-24 NOTE — Progress Notes (Signed)
Subjective:   Patient ID: [name], female   DOB: 84 y.o.   MRN: [id number]   HPI Patient presents stating she is developed a lot of pain in her right ankle and there is been swelling and we did do vein testing which was normal no indication of clot   ROS      Objective:  Physical Exam  Neurovascular status intact with inflammation of the sinus tarsi right with negative Homans sign noted currently     Assessment:  Appears to be an acute inflammatory capsulitis of the right sinus tarsi with swelling which is localized and does not appear to be related to systemic issue     Plan:  Injected the capsule 3 mg Kenalog 5 mg Xylocaine and advised this patient on compression therapy

## 2017-11-06 ENCOUNTER — Ambulatory Visit (INDEPENDENT_AMBULATORY_CARE_PROVIDER_SITE_OTHER): Payer: Medicare Other | Admitting: *Deleted

## 2017-11-06 DIAGNOSIS — I428 Other cardiomyopathies: Secondary | ICD-10-CM | POA: Diagnosis not present

## 2017-11-07 ENCOUNTER — Encounter: Payer: Self-pay | Admitting: Cardiology

## 2017-11-07 ENCOUNTER — Other Ambulatory Visit: Payer: Self-pay | Admitting: Internal Medicine

## 2017-11-07 NOTE — Progress Notes (Signed)
Remote ICD transmission.   

## 2017-11-27 ENCOUNTER — Encounter: Payer: Self-pay | Admitting: Internal Medicine

## 2017-12-03 DIAGNOSIS — M961 Postlaminectomy syndrome, not elsewhere classified: Secondary | ICD-10-CM | POA: Insufficient documentation

## 2017-12-03 DIAGNOSIS — M47816 Spondylosis without myelopathy or radiculopathy, lumbar region: Secondary | ICD-10-CM | POA: Insufficient documentation

## 2017-12-18 LAB — CUP PACEART REMOTE DEVICE CHECK
Battery Remaining Longevity: 66 mo
Battery Remaining Percentage: 100 %
Brady Statistic RA Percent Paced: 0 %
Brady Statistic RV Percent Paced: 100 %
Date Time Interrogation Session: 20190731114000
HighPow Impedance: 50 Ohm
Implantable Lead Implant Date: 20080116
Implantable Lead Implant Date: 20080116
Implantable Lead Implant Date: 20080116
Implantable Lead Location: 753859
Implantable Lead Location: 753860
Implantable Lead Location: 753860
Implantable Lead Model: 157
Implantable Lead Model: 4469
Implantable Lead Model: 4555
Implantable Lead Serial Number: 136532
Implantable Lead Serial Number: 161542
Implantable Lead Serial Number: 473495
Implantable Pulse Generator Implant Date: 20140911
Lead Channel Impedance Value: 416 Ohm
Lead Channel Impedance Value: 602 Ohm
Lead Channel Impedance Value: 874 Ohm
Lead Channel Pacing Threshold Amplitude: 0.6 V
Lead Channel Pacing Threshold Amplitude: 0.6 V
Lead Channel Pacing Threshold Amplitude: 0.9 V
Lead Channel Pacing Threshold Pulse Width: 0.4 ms
Lead Channel Pacing Threshold Pulse Width: 0.4 ms
Lead Channel Pacing Threshold Pulse Width: 0.8 ms
Lead Channel Setting Pacing Amplitude: 2 V
Lead Channel Setting Pacing Amplitude: 2 V
Lead Channel Setting Pacing Amplitude: 2.4 V
Lead Channel Setting Pacing Pulse Width: 0.4 ms
Lead Channel Setting Pacing Pulse Width: 0.8 ms
Lead Channel Setting Sensing Sensitivity: 0.6 mV
Lead Channel Setting Sensing Sensitivity: 1 mV
Pulse Gen Serial Number: 111765

## 2017-12-27 ENCOUNTER — Other Ambulatory Visit: Payer: Self-pay | Admitting: Internal Medicine

## 2018-01-11 ENCOUNTER — Other Ambulatory Visit: Payer: Self-pay | Admitting: Internal Medicine

## 2018-01-16 ENCOUNTER — Ambulatory Visit (INDEPENDENT_AMBULATORY_CARE_PROVIDER_SITE_OTHER): Payer: Medicare Other | Admitting: *Deleted

## 2018-01-16 DIAGNOSIS — Z23 Encounter for immunization: Secondary | ICD-10-CM

## 2018-01-21 ENCOUNTER — Telehealth: Payer: Self-pay | Admitting: *Deleted

## 2018-01-21 NOTE — Telephone Encounter (Signed)
Patient Notified

## 2018-01-21 NOTE — Telephone Encounter (Signed)
Patient called and stated that she called the pharmacy and her medication Losartan has been recalled. Patient is wanting to know what to substitute or if she needs to take. Please Advise.

## 2018-01-21 NOTE — Telephone Encounter (Signed)
Let's hold off doing anything with losartan med at this time; follow up Oct 30th as scheduled

## 2018-01-28 NOTE — Progress Notes (Signed)
[facility]  PATIENT: [name] DOB: [birth year]   REASON FOR VISIT: Follow-up for memory loss/mild cognitive impairment HISTORY FROM: Patient    HISTORY OF PRESENT ILLNESS:HISTORY [name] is a 84 yo RH AAF referred by her primary care Dr. Baird Cancer for evaluation of memory trouble, drive herself to office alone at today's clinical visit. 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 Nicholes Calamity under general anesthesia, woke up from surgery, she noticed mild memory trouble, she has short-term memory trouble, has been persistent since then, she denies difficulty talking, no strokelike symptoms then. She lives at home with her family, highly functional, driving, independent at daily activity, able to keep her check in balance, She suffered long-standing history of bipolar disorder, on polypharmacy treatment, this including Trileptal 300 mg daily, Zoloft 100 mg a day, Wellbutrin 150 mg 3 tablets a day, She had accident of sudden onset dizziness couple days ago, after dinner, she felt lightheaded, has to crawling upstairs, was helped by her husband to get up, then she fell to the ground, whole-body shaking, no loss of consciousness. She has baseline mild gait difficulty due to her low back pain, bilateral knee pain, She denied a family history of dementia, CT head in 2013, Unchanged mild atrophy and microvascular ischemic disease without acute intracranial process. She had a history of chronic systolic heart failure, left bundle branch block, status post biventricular ICD insertion in 2009, underwent device generator change out Sept 2014. S She presents for one year evaluation. She reports feeling dizzy Tuesday night with improvement by Wednesday morning. She also reports upper body "shaking". The only pain she  has is from arthritis. She gets some mild edema in her right ankle. She reports her blood pressure at home usually runs 110-120/60-70. Sometimes when she gets up quickly, she felt lightheaded.  UPDATE April 8th 2016: She is overall doing very well, only has occasionally dizziness, especially when she gets up quickly, today's Mini-Mental status examination is 29 out of 30 CAT scan of the brain showed mild small vessel disease, no acute lesions, EEG showed mild slowing  UPDATE July 16 2016:YY She drive here herself, she continue complains of worsening memory loss, she lives with her husband in their house of more than 78 years old, she is independent in her daily activity, exercise regularly,  Following senior emexercise program on TV, she has mild low back pain, cause mild gait difficulty sometimes, radiating pain to bilateral lower extremity, she has no bowel and bladder incontinence. UPDATE 10/10/2018CM [name], 84 year old female returns for follow-up with history of memory loss mild cognitive impairment. She lives with her husband. She no longer does the finances but is basically independent in most other activities. She continues to cook without burning any recipes. She continues to drive without getting lost. She exercises regularly by walking short distances she has had one fall in the last 6 months, she missed a step in the darkness. She does not do anything to really stimulate her memory such as cross words etc. She continues to complain of mild low back pain no bowel or bladder incontinence. She had side effects to 10 mg of Aricept so she cut the dose in half however she still complains with drowsiness and fogginess on the medication. She has a history of bipolar disorder and is on polypharmacy.  She returns for reevaluation UPDATE 4/17/2019CM [name], 84 year old female returns for follow-up with a history of memory loss mild cognitive impairment.  She has failed Aricept in the past as well  as most recently Namenda due to side effects.  She continues to live with her husband.  She no longer does the finances but is independent in other activities of daily living.  She denies getting lost when driving she actually exercises 4 days a week.  She has had one fall in the last 6 months she tripped  in her backyard, no apparent injury.  She also has a history of low back pain without bowel or bladder incontinence.  She has a history of bipolar disorder and is on polypharmacy.  She returns for reevaluation UPDATE 10/23/2019CM [name], 84 year old female returns for follow-up with history of mild cognitive impairment.  She continues to live with her husband.  She remains  independent in activities of daily living.  She drives without difficulty.  She denies getting lost she has a history of bipolar disorder and is on polypharmacy.  She sees a psychiatrist Dr. Reece Levy.  She exercises 4 days a week.  She has failed Aricept and Namenda and Exelon due to side effects.  She returns for reevaluation REVIEW OF SYSTEMS: Full 14 system review of systems performed and notable only for those listed, all others are neg:  Constitutional: neg  Cardiovascular: neg Ear/Nose/Throat: neg  Skin: neg Eyes: neg Respiratory: neg Gastroitestinal: neg  Hematology/Lymphatic: neg  Endocrine: Intolerance to heat Musculoskeletal: Back pain Allergy/Immunology: Environmental allergies Neurological: Memory loss, Psychiatric: Depression and anxiety Sleep : neg   ALLERGIES: Allergies  Allergen Reactions  . Iodine Shortness Of Breath    Iodine contrast, CHF , SOB  . Shellfish Allergy Shortness Of Breath  . Memantine     Malaise, fogginess/ couldn't think  . Aspirin Nausea And Vomiting  . Codeine Nausea And Vomiting    HOME MEDICATIONS: Outpatient Medications Prior to Visit  Medication Sig Dispense Refill  . acetaminophen (TYLENOL) 325 MG tablet Take 2 tablets (650 mg total) by mouth every 6 (six) hours as  needed for mild pain (or Fever >/= 101).    Marland Kitchen buPROPion (WELLBUTRIN XL) 150 MG 24 hr tablet Take 3 tablets (450 mg total) by mouth daily. 270 tablet 3  . cholecalciferol (VITAMIN D) 1000 UNITS tablet Take 1,000 Units by mouth daily.    . furosemide (LASIX) 40 MG tablet Take 20 mg by mouth daily.    Marland Kitchen levothyroxine (SYNTHROID, LEVOTHROID) 100 MCG tablet Take 1 tablet (100 mcg total) by mouth daily before breakfast. 90 tablet 3  . Magnesium 250 MG TABS Take 250 mg by mouth daily.    . metoprolol tartrate (LOPRESSOR) 25 MG tablet TAKE 1 TABLET BY MOUTH  EVERY MORNING AND ONE-HALF  TABLET EVERY EVENING 135 tablet 1  . NONFORMULARY OR COMPOUNDED ITEM APPLY 1-2 GMS TO AFFECTED AREA 3-4 TIMES DAILY AS NEEDED FOR PAIN. THIS IS A COMPOUNDED CREAM.1 PUMP EQUALS 1 GRAM.  2  . Omega-3 Fatty Acids (FISH OIL) 1200 MG CAPS Take 1,200 mg by mouth every morning.    Marland Kitchen omeprazole (PRILOSEC) 40 MG capsule TAKE 1 CAPSULE BY MOUTH  DAILY FOR STOMACH 90 capsule 1  . Oxcarbazepine (TRILEPTAL) 300 MG tablet Take 300 mg by mouth every morning.     . potassium chloride (K-DUR,KLOR-CON) 10 MEQ tablet TAKE 1 TABLET BY MOUTH  DAILY 90 tablet 1  . spironolactone (ALDACTONE) 25 MG tablet TAKE  1 TABLET BY MOUTH  EVERY MORNING 90 tablet 1  . tiZANidine (ZANAFLEX) 4 MG tablet TAKE 1 TABLET EVERY 6 HOURS AS NEEDED FOR MUSCLE SPASM. 60 tablet 2  . vitamin E 1000 UNIT capsule Take 1,000 Units by mouth daily.     No facility-administered medications prior to visit.     PAST MEDICAL HISTORY: Past Medical History:  Diagnosis Date  . Arthritis   . Cancer [facility])    left breast cancer   . CHF (congestive heart failure) ([place])    PACEMAKER & DEFIB  . Complication of anesthesia    hypotensive after back surgery in 2006- reports on chart   . Depression   . Dyslipidemia   . Fainted 04/21/06   AT [place]  . GERD (gastroesophageal reflux disease)   . Headache(784.0)   . Hearing loss   . HLD (hyperlipidemia)   . Hypertension   .  Hypothyroidism   . ICD (implantable cardiac defibrillator) in place    pt has pacer/icd  . ICD (implantable cardiac defibrillator), biventricular, in situ   . LBBB (left bundle branch block)   . Memory loss   . Nonischemic cardiomyopathy ([place])   . Normal coronary arteries    s/p cardiac cath 2007  . Pacemaker    Guidant Device  . Syncope   . Systolic CHF ([place])   . Vertigo   . Wears glasses     PAST SURGICAL HISTORY: Past Surgical History:  Procedure Laterality Date  . BACK SURGERY     lumbar fusion   . BREAST SURGERY  2000   LUMP REMOVAL. STAGE 1 CANCER  . CARDIAC CATHETERIZATION    . CATARACT EXTRACTION    . EYE SURGERY    . IMPLANTABLE CARDIOVERTER DEFIBRILLATOR GENERATOR CHANGE N/A 12/18/2012   Procedure: IMPLANTABLE CARDIOVERTER DEFIBRILLATOR GENERATOR CHANGE;  Surgeon: [name], MD;  Location: [facility] CATH LAB;  Service: Cardiovascular;  Laterality: N/A;  . JOINT REPLACEMENT  06/14/01   right  . LUMBAR FUSION  2006  . MASS EXCISION  11/08/2011   Procedure: EXCISION MASS;  Surgeon: [name], MD;  Location: [facility] ORS;  Service: General;  Laterality: Left;  Excision Left Thigh Mass  . MASTECTOMY, PARTIAL  2008   GOT PACEMAKER AND DEFIB AT THAT TIME  . PACEMAKER INSERTION  04/23/06  . TOTAL KNEE ARTHROPLASTY  05/17/01   RIGHT KNEE  . TOTAL KNEE ARTHROPLASTY Left 11/29/2014   Procedure: TOTAL LEFT KNEE ARTHROPLASTY;  Surgeon: Paralee Cancel, MD;  Location: [facility] ORS;  Service: Orthopedics;  Laterality: Left;    FAMILY HISTORY: Family History  Problem Relation Age of Onset  . Hypertension Mother   . Arthritis Mother   . Hypertension Father   . Hypertension Brother   . Hypertension Brother     SOCIAL HISTORY: Social History   Socioeconomic History  . Marital status: Married    Spouse name: Not on file  . Number of children: 1  . Years of education: Masters  . Highest education level: Not on file  Occupational History  . Occupation: Retired  Scientific laboratory technician  . Financial  resource strain: Not hard at all  . Food insecurity:    Worry: Never true    Inability: Never true  . Transportation needs:    Medical: No    Non-medical: No  Tobacco Use  . Smoking status: Never Smoker  . Smokeless tobacco: Never Used  Substance and Sexual Activity  . Alcohol use: No  . Drug use: No  .  Sexual activity: Not Currently    Birth control/protection: Post-menopausal  Lifestyle  . Physical activity:    Days per week: 4 days    Minutes per session: 30 min  . Stress: To some extent  Relationships  . Social connections:    Talks on phone: More than three times a week    Gets together: Once a week    Attends religious service: More than 4 times per year    Active member of club or organization: Yes    Attends meetings of clubs or organizations: Never    Relationship status: Married  . Intimate partner violence:    Fear of current or ex partner: No    Emotionally abused: No    Physically abused: No    Forced sexual activity: No  Other Topics Concern  . Not on file  Social History Narrative   Lives at home with husband.   Right-handed.      As of 07/28/2014   Diet: No special diet   Caffeine: yes, Chocolate, tea and sodas    Married: YES, 1970   House: Yes, 2 stories, 2-3 persons live in home   Pets: No   Current/Past profession: Engineer, mining, Nurse Practitioner    Exercise: Yes 2-3 x weekly   Living Will: Yes   DNR: No   POA/HPOA: No        PHYSICAL EXAM  Vitals:   01/29/18 0924  BP: 127/72  Pulse: 64  Weight: 120 lb 9.6 oz (54.7 kg)  Height: 4\' 10"  (1.473 m)   Body mass index is 25.21 kg/m.  Generalized: Well developed, in no acute distress  Head: normocephalic and atraumatic,. Oropharynx benign  Neck: Supple, no carotid bruits  Cardiac: Regular rate rhythm, no murmur  Musculoskeletal: No deformity   Neurological examination   Mentation: Alert  Clock drawing 3/4 MMSE - Mini Mental State Exam 01/29/2018 07/24/2017 01/16/2017  Not  completed: (No Data) - -  Orientation to time 5 4 4   Orientation to Place 5 5 5   Registration 3 3 3   Attention/ Calculation 5 2 5   Recall 1 2 1   Language- name 2 objects 2 2 2   Language- repeat 1 1 1   Language- follow 3 step command 3 3 3   Language- read & follow direction 1 1 1   Write a sentence 1 1 1   Copy design 1 1 1   Total score 28 25 27     Follows all commands speech and language fluent.   Cranial nerve II-XII: Pupils were equal round reactive to light extraocular movements were full, visual field were full on confrontational test. Facial sensation and strength were normal. hearing was intact to finger rubbing bilaterally. Uvula tongue midline. head turning and shoulder shrug were normal and symmetric.Tongue protrusion into cheek strength was normal. Motor: Mild bilateral toe extension flexion weakness ,   Sensory: normal and symmetric to light touch, pinprick, and  Vibration, in the upper and lower extremities  Coordination: finger-nose-finger, heel-to-shin bilaterally, no dysmetria Reflexes: Brachioradialis 2/2, biceps 2/2, triceps 2/2, patellar absent , Achilles trace  plantar responses were flexor bilaterally. Gait and Station: Rising up from seated position without assistance, mild antalgic gait no assistive device.  Unsteady with tandem.  No assistive device  DIAGNOSTIC DATA (LABS, IMAGING, TESTING) - I reviewed patient records, labs, notes, testing and imaging myself where available.  Lab Results  Component Value Date   WBC 8.6 08/30/2017   HGB 12.8 08/30/2017   HCT 38.3 08/30/2017   MCV  85.1 08/30/2017   PLT 134 (L) 08/30/2017      Component Value Date/Time   NA 141 08/30/2017 1251   NA 139 10/05/2015 0944   K 5.1 08/30/2017 1251   CL 104 08/30/2017 1251   CO2 30 08/30/2017 1251   GLUCOSE 77 08/30/2017 1251   BUN 27 (H) 08/30/2017 1251   BUN 25 10/05/2015 0944   CREATININE 0.99 (H) 08/30/2017 1251   CALCIUM 10.6 (H) 08/30/2017 1251   PROT 6.5 03/27/2017  1534   PROT 6.4 10/05/2015 0944   ALBUMIN 4.1 12/07/2016 1215   ALBUMIN 4.3 10/05/2015 0944   AST 24 03/27/2017 1534   ALT 17 08/30/2017 1251   ALKPHOS 66 12/07/2016 1215   BILITOT 0.2 03/27/2017 1534   BILITOT <0.2 10/05/2015 0944   GFRNONAA 53 (L) 08/30/2017 1251   GFRAA 62 08/30/2017 1251   Lab Results  Component Value Date   CHOL 181 08/30/2017   HDL 56 08/30/2017   LDLCALC 98 08/30/2017   TRIG 175 (H) 08/30/2017   CHOLHDL 3.2 08/30/2017    Lab Results  Component Value Date   VITAMINB12 1,117 (H) 07/28/2014   Lab Results  Component Value Date   TSH 2.21 08/30/2017      ASSESSMENT AND PLAN  84 y.o. year old female  has a past medical history of Mild Cognitive impairment, gait abnormality bilateral knee pain. Patient has had side effects to Aricept and Namenda  and exelon patch.                                      MMSE is stable Continue exercise program 4 times weekly,Gait abnormality multifactorial to include bilateral knee pain chronic low back pain mild toe extension weakness she will continue to exercises by walking at least 30 minutes a day For your mild cognitive impairment important to do exercises to stimulate the memory such as cross words , word search, Scrabble, any strategy games. F/U In 6 months.   I spent 25 min in total face to face time with the patient more than 50% of which was spent counseling and coordination of care, reviewing test results reviewing medications and discussing and reviewing the diagnosis of Mild cognitive impairment and further treatment options.  Patient has failed both  Aricept and  Namenda and exelon.  Also the importance of exercise the memory by cross words word search etc. ,        Dennie Bible, [facility], [facility], APRN  [facility] [address], [place], [place] [address] [phone]

## 2018-01-29 ENCOUNTER — Encounter: Payer: Self-pay | Admitting: Nurse Practitioner

## 2018-01-29 ENCOUNTER — Ambulatory Visit (INDEPENDENT_AMBULATORY_CARE_PROVIDER_SITE_OTHER): Payer: Medicare Other | Admitting: Nurse Practitioner

## 2018-01-29 VITALS — BP 127/72 | HR 64 | Ht <= 58 in | Wt 120.6 lb

## 2018-01-29 DIAGNOSIS — R269 Unspecified abnormalities of gait and mobility: Secondary | ICD-10-CM | POA: Diagnosis not present

## 2018-01-29 DIAGNOSIS — R413 Other amnesia: Secondary | ICD-10-CM

## 2018-01-29 DIAGNOSIS — G3184 Mild cognitive impairment, so stated: Secondary | ICD-10-CM | POA: Diagnosis not present

## 2018-01-29 DIAGNOSIS — M5441 Lumbago with sciatica, right side: Secondary | ICD-10-CM | POA: Diagnosis not present

## 2018-01-29 DIAGNOSIS — G8929 Other chronic pain: Secondary | ICD-10-CM

## 2018-01-29 NOTE — Patient Instructions (Signed)
Continue exercise program 4 times weekly,Gait abnormality multifactorial to include bilateral knee pain chronic low back pain mild toe extension weakness she will continue to exercises by walking at least 30 minutes a day For your mild cognitive impairment important to do exercises to stimulate the memory such as cross words , word search, Scrabble, any strategy games. F/U In 6 months

## 2018-01-29 NOTE — Progress Notes (Signed)
I have reviewed and agreed above plan. 

## 2018-02-05 ENCOUNTER — Encounter: Payer: Self-pay | Admitting: Internal Medicine

## 2018-02-05 ENCOUNTER — Ambulatory Visit (INDEPENDENT_AMBULATORY_CARE_PROVIDER_SITE_OTHER): Payer: Medicare Other | Admitting: *Deleted

## 2018-02-05 ENCOUNTER — Ambulatory Visit (INDEPENDENT_AMBULATORY_CARE_PROVIDER_SITE_OTHER): Payer: Medicare Other | Admitting: Internal Medicine

## 2018-02-05 VITALS — BP 122/78 | HR 67 | Temp 97.8°F | Ht <= 58 in | Wt 119.0 lb

## 2018-02-05 DIAGNOSIS — E782 Mixed hyperlipidemia: Secondary | ICD-10-CM

## 2018-02-05 DIAGNOSIS — M545 Low back pain, unspecified: Secondary | ICD-10-CM

## 2018-02-05 DIAGNOSIS — M2550 Pain in unspecified joint: Secondary | ICD-10-CM

## 2018-02-05 DIAGNOSIS — I428 Other cardiomyopathies: Secondary | ICD-10-CM | POA: Diagnosis not present

## 2018-02-05 DIAGNOSIS — G8929 Other chronic pain: Secondary | ICD-10-CM

## 2018-02-05 DIAGNOSIS — Z79899 Other long term (current) drug therapy: Secondary | ICD-10-CM

## 2018-02-05 DIAGNOSIS — I5022 Chronic systolic (congestive) heart failure: Secondary | ICD-10-CM

## 2018-02-05 DIAGNOSIS — R634 Abnormal weight loss: Secondary | ICD-10-CM

## 2018-02-05 DIAGNOSIS — G3184 Mild cognitive impairment, so stated: Secondary | ICD-10-CM

## 2018-02-05 DIAGNOSIS — E034 Atrophy of thyroid (acquired): Secondary | ICD-10-CM

## 2018-02-05 DIAGNOSIS — F3341 Major depressive disorder, recurrent, in partial remission: Secondary | ICD-10-CM

## 2018-02-05 LAB — CBC WITH DIFFERENTIAL/PLATELET
Basophils Absolute: 37 cells/uL (ref 0–200)
Basophils Relative: 0.4 %
Eosinophils Absolute: 267 cells/uL (ref 15–500)
Eosinophils Relative: 2.9 %
HCT: 40.5 % (ref 35.0–45.0)
Hemoglobin: 13.1 g/dL (ref 11.7–15.5)
Lymphs Abs: 1987 cells/uL (ref 850–3900)
MCH: 28 pg (ref 27.0–33.0)
MCHC: 32.3 g/dL (ref 32.0–36.0)
MCV: 86.5 fL (ref 80.0–100.0)
MPV: 11.4 fL (ref 7.5–12.5)
Monocytes Relative: 7.1 %
Neutro Abs: 6256 cells/uL (ref 1500–7800)
Neutrophils Relative %: 68 %
Platelets: 117 10*3/uL — ABNORMAL LOW (ref 140–400)
RBC: 4.68 10*6/uL (ref 3.80–5.10)
RDW: 14 % (ref 11.0–15.0)
Total Lymphocyte: 21.6 %
WBC mixed population: 653 cells/uL (ref 200–950)
WBC: 9.2 10*3/uL (ref 3.8–10.8)

## 2018-02-05 LAB — COMPLETE METABOLIC PANEL WITH GFR
AG Ratio: 1.7 (calc) (ref 1.0–2.5)
ALT: 22 U/L (ref 6–29)
AST: 16 U/L (ref 10–35)
Albumin: 4.2 g/dL (ref 3.6–5.1)
Alkaline phosphatase (APISO): 43 U/L (ref 33–130)
BUN/Creatinine Ratio: 31 (calc) — ABNORMAL HIGH (ref 6–22)
BUN: 30 mg/dL — ABNORMAL HIGH (ref 7–25)
CO2: 30 mmol/L (ref 20–32)
Calcium: 10.2 mg/dL (ref 8.6–10.4)
Chloride: 106 mmol/L (ref 98–110)
Creat: 0.96 mg/dL — ABNORMAL HIGH (ref 0.60–0.88)
GFR, Est African American: 64 mL/min/{1.73_m2} (ref >=60)
GFR, Est Non African American: 55 mL/min/{1.73_m2} — ABNORMAL LOW (ref >=60)
Globulin: 2.5 g/dL (calc) (ref 1.9–3.7)
Glucose, Bld: 88 mg/dL (ref 65–139)
Potassium: 4.5 mmol/L (ref 3.5–5.3)
Sodium: 142 mmol/L (ref 135–146)
Total Bilirubin: 0.3 mg/dL (ref 0.2–1.2)
Total Protein: 6.7 g/dL (ref 6.1–8.1)

## 2018-02-05 LAB — LIPID PANEL
Cholesterol: 175 mg/dL (ref <200)
HDL: 54 mg/dL (ref >50)
LDL Cholesterol (Calc): 100 mg/dL (calc) — ABNORMAL HIGH
Non-HDL Cholesterol (Calc): 121 mg/dL (calc) (ref <130)
Total CHOL/HDL Ratio: 3.2 (calc) (ref <5.0)
Triglycerides: 110 mg/dL (ref <150)

## 2018-02-05 LAB — URINALYSIS, ROUTINE W REFLEX MICROSCOPIC
Bilirubin Urine: NEGATIVE
Glucose, UA: NEGATIVE
Hgb urine dipstick: NEGATIVE
Ketones, ur: NEGATIVE
Leukocytes, UA: NEGATIVE
Nitrite: NEGATIVE
Protein, ur: NEGATIVE
Specific Gravity, Urine: 1.021 (ref 1.001–1.03)
pH: 6.5 (ref 5.0–8.0)

## 2018-02-05 LAB — TSH: TSH: 1.56 mIU/L (ref 0.40–4.50)

## 2018-02-05 LAB — T4, FREE: Free T4: 1.1 ng/dL (ref 0.8–1.8)

## 2018-02-05 NOTE — Patient Instructions (Addendum)
Will call with lab results  Follow up with specialists as scheduled  Continue nutritional supplements as tolerated when you skip a meal  Follow up after December 20th for CPE with Sherrie Mustache, NP-C.

## 2018-02-05 NOTE — Progress Notes (Signed)
Patient ID: [name], female   DOB: March 23, 1934, 84 y.o.   MRN: [id number]   Location:  [facility] OFFICE  Provider: DR Arletha Grippe  Code Status:  Goals of Care:  Advanced Directives 10/07/2017  Does Patient Have a Medical Advance Directive? Yes  Type of Advance Directive Out of facility DNR (pink MOST or yellow form);Living will  Does patient want to make changes to medical advance directive? -  Copy of [place] in Chart? -  Pre-existing out of facility DNR order (yellow form or pink MOST form) Yellow form placed in chart (order not valid for inpatient use)     Chief Complaint  Patient presents with  . Medical Management of Chronic Issues    5 month follow-up on thyroid, memory, HF and MMD     HPI: Patient is a 84 y.o. female seen today for medical management of chronic diseases.  She is c/a wt loss as she states she eats 2 meals per day and "snacks" for lunch. She is still driving. She saw neurology last week and has MCI. She could not tolerated namenda, aricept or exelon. She has 1 daughter who lives in [place] and is not supportive. She is active in church which is her main support system. Her spouse lives with her. She is a poor historian due to memory loss. Hx obtained from chart.  Chronic back pain - she has lumbar spondylosis and is s/p back sx; followed by Ortho Dr Nelva Bush. She receives steroid injections periodically. She has occasional pain in her anterior thigh b/l; occurs when she gets OOB and improves as day progresses.   Skin tags on perineum - down to 1 tag. last colonoscopy in 06/2011. Rectal exam in Aug 2017 revealed no abnormalities. Unchanged  PAD - vascular US that was neg for DVT. ABI left 0.55 (prev 0.56); right 1.06 (prev 1.03) in June 2019. She was told by vascular sx that she had varicose veins  Dementia/MCI - stable. She reports '5mg'$  aricept caused next day sedation and she stopped med. Followed by neuro Dr Krista Blue. MMSE 27/30. Weight down 12 lbs  since march 2019. She was started on namenda '5mg'$  titration but she states '10mg'$  BID caused next day drowsiness and she reduced dose herself to 1 tab daily but now Is not taking anything at all. Overall, she has failed namenda, aricept and exelon patch 2/2 ADRs.  Hx Left distal radial nondisplaced fx - she fell on May 10, 2015 in parking lot. She was sent for xray and had a fx. She is followed by Dr Cay Schillings. Hand is painful occasionally. left knee pain stable and is followed by Dr Alvan Dame. She takes zanaflex prn   MDD - mood overall stable. She has hx weight loss. She is followed by pscyh Dr Reece Levy for psych. Currently taking sertraline, trileptal and wellbutrin xl. She has mentioned her weight loss to her mental health provider. She has never had counseling.  Chronic systolic HF/AICD&pacer/LBBB/NICM - followed by cardiology Dr Lovena Le. She takes lasix with K supplement, lopressor, benicar, aldactone. EF 45-50% in 2015  (prev 20% in 2008). Pacer last checked in Oct 2018.  Hyperlipidemia - diet controlled. She also takes flax seed oil. LDL 82.   Hypothyroidism - stable on levothyroxine. TSH 2.04; T4 free 0.9  Osteoporosis - she started prolia injections in July 2019. Her out-of-pocket cost is $220. She has GERD which would increase adverse events if she took an oral bisphosphonate. She also has dementia and compliance  may be an issue with oral weekly medication. T score -2.8 on Oct 2018 DXA.    Past Medical History:  Diagnosis Date  . Arthritis   . Cancer [facility])    left breast cancer   . CHF (congestive heart failure) ([place])    PACEMAKER & DEFIB  . Complication of anesthesia    hypotensive after back surgery in 2006- reports on chart   . Depression   . Dyslipidemia   . Fainted 04/21/06   AT [place]  . GERD (gastroesophageal reflux disease)   . Headache(784.0)   . Hearing loss   . HLD (hyperlipidemia)   . Hypertension   . Hypothyroidism   . ICD (implantable cardiac defibrillator) in place     pt has pacer/icd  . ICD (implantable cardiac defibrillator), biventricular, in situ   . LBBB (left bundle branch block)   . Memory loss   . Nonischemic cardiomyopathy ([place])   . Normal coronary arteries    s/p cardiac cath 2007  . Pacemaker    Guidant Device  . Syncope   . Systolic CHF ([place])   . Vertigo   . Wears glasses     Past Surgical History:  Procedure Laterality Date  . BACK SURGERY     lumbar fusion   . BREAST SURGERY  2000   LUMP REMOVAL. STAGE 1 CANCER  . CARDIAC CATHETERIZATION    . CATARACT EXTRACTION    . EYE SURGERY    . IMPLANTABLE CARDIOVERTER DEFIBRILLATOR GENERATOR CHANGE N/A 12/18/2012   Procedure: IMPLANTABLE CARDIOVERTER DEFIBRILLATOR GENERATOR CHANGE;  Surgeon: [name], MD;  Location: [facility] CATH LAB;  Service: Cardiovascular;  Laterality: N/A;  . JOINT REPLACEMENT  06/14/01   right  . LUMBAR FUSION  2006  . MASS EXCISION  11/08/2011   Procedure: EXCISION MASS;  Surgeon: [name], MD;  Location: [facility] ORS;  Service: General;  Laterality: Left;  Excision Left Thigh Mass  . MASTECTOMY, PARTIAL  2008   GOT PACEMAKER AND DEFIB AT THAT TIME  . PACEMAKER INSERTION  04/23/06  . TOTAL KNEE ARTHROPLASTY  05/17/01   RIGHT KNEE  . TOTAL KNEE ARTHROPLASTY Left 11/29/2014   Procedure: TOTAL LEFT KNEE ARTHROPLASTY;  Surgeon: Paralee Cancel, MD;  Location: [facility] ORS;  Service: Orthopedics;  Laterality: Left;     reports that she has never smoked. She has never used smokeless tobacco. She reports that she does not drink alcohol or use drugs. Social History   Socioeconomic History  . Marital status: Married    Spouse name: Not on file  . Number of children: 1  . Years of education: Masters  . Highest education level: Not on file  Occupational History  . Occupation: Retired  Scientific laboratory technician  . Financial resource strain: Not hard at all  . Food insecurity:    Worry: Never true    Inability: Never true  . Transportation needs:    Medical: No    Non-medical: No  Tobacco  Use  . Smoking status: Never Smoker  . Smokeless tobacco: Never Used  Substance and Sexual Activity  . Alcohol use: No  . Drug use: No  . Sexual activity: Not Currently    Birth control/protection: Post-menopausal  Lifestyle  . Physical activity:    Days per week: 4 days    Minutes per session: 30 min  . Stress: To some extent  Relationships  . Social connections:    Talks on phone: More than three times a week    Gets together:  Once a week    Attends religious service: More than 4 times per year    Active member of club or organization: Yes    Attends meetings of clubs or organizations: Never    Relationship status: Married  . Intimate partner violence:    Fear of current or ex partner: No    Emotionally abused: No    Physically abused: No    Forced sexual activity: No  Other Topics Concern  . Not on file  Social History Narrative   Lives at home with husband.   Right-handed.      As of 07/28/2014   Diet: No special diet   Caffeine: yes, Chocolate, tea and sodas    Married: YES, 1970   House: Yes, 2 stories, 2-3 persons live in home   Pets: No   Current/Past profession: Engineer, mining, Designer, jewellery    Exercise: Yes 2-3 x weekly   Living Will: Yes   DNR: No   POA/HPOA: No       Family History  Problem Relation Age of Onset  . Hypertension Mother   . Arthritis Mother   . Hypertension Father   . Hypertension Brother   . Hypertension Brother     Allergies  Allergen Reactions  . Iodine Shortness Of Breath    Iodine contrast, CHF , SOB  . Shellfish Allergy Shortness Of Breath  . Memantine     Malaise, fogginess/ couldn't think  . Aspirin Nausea And Vomiting  . Codeine Nausea And Vomiting    Outpatient Encounter Medications as of 02/05/2018  Medication Sig  . acetaminophen (TYLENOL) 325 MG tablet Take 2 tablets (650 mg total) by mouth every 6 (six) hours as needed for mild pain (or Fever >/= 101).  Marland Kitchen buPROPion (WELLBUTRIN XL) 150 MG 24 hr tablet Take  3 tablets (450 mg total) by mouth daily.  . cholecalciferol (VITAMIN D) 1000 UNITS tablet Take 1,000 Units by mouth daily.  . furosemide (LASIX) 40 MG tablet Take 20 mg by mouth daily.  Marland Kitchen levothyroxine (SYNTHROID, LEVOTHROID) 100 MCG tablet Take 1 tablet (100 mcg total) by mouth daily before breakfast.  . Magnesium 250 MG TABS Take 250 mg by mouth daily.  . metoprolol tartrate (LOPRESSOR) 25 MG tablet TAKE 1 TABLET BY MOUTH  EVERY MORNING AND ONE-HALF  TABLET EVERY EVENING  . NONFORMULARY OR COMPOUNDED ITEM APPLY 1-2 GMS TO AFFECTED AREA 3-4 TIMES DAILY AS NEEDED FOR PAIN. THIS IS A COMPOUNDED CREAM.1 PUMP EQUALS 1 GRAM.  . Omega-3 Fatty Acids (FISH OIL) 1200 MG CAPS Take 1,200 mg by mouth every morning.  Marland Kitchen omeprazole (PRILOSEC) 40 MG capsule TAKE 1 CAPSULE BY MOUTH  DAILY FOR STOMACH  . Oxcarbazepine (TRILEPTAL) 300 MG tablet Take 300 mg by mouth every morning.   . potassium chloride (K-DUR,KLOR-CON) 10 MEQ tablet TAKE 1 TABLET BY MOUTH  DAILY  . spironolactone (ALDACTONE) 25 MG tablet TAKE 1 TABLET BY MOUTH  EVERY MORNING  . tiZANidine (ZANAFLEX) 4 MG tablet TAKE 1 TABLET EVERY 6 HOURS AS NEEDED FOR MUSCLE SPASM.  Marland Kitchen vitamin E 1000 UNIT capsule Take 1,000 Units by mouth daily.   No facility-administered encounter medications on file as of 02/05/2018.     Review of Systems:  Review of Systems  Unable to perform ROS: Dementia    Health Maintenance  Topic Date Due  . MAMMOGRAM  03/06/2018  . TETANUS/TDAP  01/08/2023  . INFLUENZA VACCINE  Completed  . DEXA SCAN  Completed  . PNA vac  Low Risk Adult  Completed    Physical Exam: Vitals:   02/05/18 0902  BP: 122/78  Pulse: 67  Temp: 97.8 F (36.6 C)  TempSrc: Oral  SpO2: 98%  Weight: 119 lb (54 kg)  Height: '4\' 10"'$  (1.473 m)   Body mass index is 24.87 kg/m. Physical Exam  Constitutional: She appears well-developed and well-nourished.  HENT:  Mouth/Throat: Oropharynx is clear and moist. No oropharyngeal exudate.  MMM; no oral  thrush  Eyes: Pupils are equal, round, and reactive to light. No scleral icterus.  Neck: Neck supple. Carotid bruit is not present. No tracheal deviation present. No thyromegaly present.  Cardiovascular: Normal rate, regular rhythm and intact distal pulses. Exam reveals no gallop and no friction rub.  Murmur (1/6 SEM) heard. Pulses:      Dorsalis pedis pulses are 2+ on the right side, and 1+ on the left side.       Posterior tibial pulses are 2+ on the right side, and 1+ on the left side.  Trace LE edema b/l; no calf TTP; varicose veins palpable  Pulmonary/Chest: Effort normal and breath sounds normal. No stridor. No respiratory distress. She has no wheezes. She has no rales.  Palpable ACW pacer  Abdominal: Soft. Normal appearance and bowel sounds are normal. She exhibits no distension and no mass. There is no hepatomegaly. There is no tenderness. There is no rigidity, no rebound and no guarding. No hernia.  Musculoskeletal: She exhibits edema (small and large joints).  Lymphadenopathy:    She has no cervical adenopathy.  Neurological: She is alert. She has normal reflexes.  Skin: Skin is warm and dry. No rash noted.  Senile purpura left forearm  Psychiatric: She has a normal mood and affect. Her behavior is normal. Judgment and thought content normal.    Labs reviewed: Basic Metabolic Panel: Recent Labs    03/27/17 1534 08/30/17 1251  NA 141 141  K 4.2 5.1  CL 106 104  CO2 31 30  GLUCOSE 71 77  BUN 28* 27*  CREATININE 0.81 0.99*  CALCIUM 10.2 10.6*  TSH 2.04 2.21   Liver Function Tests: Recent Labs    03/27/17 1534 08/30/17 1251  AST 24  --   ALT 32* 17  BILITOT 0.2  --   PROT 6.5  --    No results for input(s): LIPASE, AMYLASE in the last 8760 hours. No results for input(s): AMMONIA in the last 8760 hours. CBC: Recent Labs    08/30/17 1251  WBC 8.6  NEUTROABS 5,289  HGB 12.8  HCT 38.3  MCV 85.1  PLT 134*   Lipid Panel: Recent Labs    03/27/17 1534  08/30/17 1251  CHOL 168 181  HDL 65 56  LDLCALC 82 98  TRIG 118 175*  CHOLHDL 2.6 3.2   No results found for: HGBA1C  Procedures since last visit: No results found.  Assessment/Plan   ICD-10-CM   1. Weight loss - progressive; possibly 2/2 psych meds vs worsening cognition R63.4 CMP with eGFR([facility])    CBC with Differential/Platelets    Urinalysis with Reflex Microscopic  2. Hypothyroidism due to acquired atrophy of thyroid E03.4 TSH    T4, Free  3. Mixed hyperlipidemia E78.2 Lipid Panel  4. Mild cognitive impairment G31.84   5. Chronic systolic heart failure (HCC) I50.22   6. Recurrent major depressive disorder, in partial remission ([place]) F33.41   7. Chronic pain of multiple joints M25.50    G89.29   8. Chronic bilateral low back  pain without sciatica M54.5    G89.29    2/2 lumbar spondylosis with hx back sx; followed by Dr Nelva Bush  9. High risk medication use Z79.899 CMP with eGFR([facility])   Will call with lab results  She needs to s/w Dr Reece Levy regarding effect psych meds could be having on her weight  Follow up with specialists as scheduled  Continue nutritional supplements as tolerated when you skip a meal  Follow up after December 20th for CPE with Sherrie Mustache, NP-C.    Darean Rote S. Perlie Gold  [facility] [address] [place], [place] [address] [phone] Cell (Monday-Friday 8 AM - 5 PM) [phone] After 5 PM and follow prompts

## 2018-02-06 ENCOUNTER — Other Ambulatory Visit: Payer: Self-pay | Admitting: *Deleted

## 2018-02-06 DIAGNOSIS — R238 Other skin changes: Secondary | ICD-10-CM

## 2018-02-06 DIAGNOSIS — R233 Spontaneous ecchymoses: Secondary | ICD-10-CM

## 2018-02-06 DIAGNOSIS — R634 Abnormal weight loss: Secondary | ICD-10-CM

## 2018-02-07 ENCOUNTER — Encounter: Payer: Self-pay | Admitting: Hematology and Oncology

## 2018-02-07 NOTE — Progress Notes (Signed)
Remote ICD transmission.   

## 2018-02-14 ENCOUNTER — Encounter: Payer: Self-pay | Admitting: Cardiology

## 2018-02-17 ENCOUNTER — Telehealth: Payer: Self-pay | Admitting: Hematology and Oncology

## 2018-02-17 ENCOUNTER — Encounter: Payer: Self-pay | Admitting: Hematology and Oncology

## 2018-02-17 NOTE — Telephone Encounter (Signed)
Pt cld to reschedule appt to see Dr. Audelia Hives on 11/27 at 1pm.

## 2018-02-25 ENCOUNTER — Encounter: Payer: Medicare Other | Admitting: Hematology and Oncology

## 2018-02-26 ENCOUNTER — Telehealth: Payer: Self-pay | Admitting: Hematology and Oncology

## 2018-02-26 ENCOUNTER — Encounter: Payer: Self-pay | Admitting: Hematology and Oncology

## 2018-02-26 NOTE — Telephone Encounter (Signed)
Cld the pt to inform that her appt has been rescheduled to see Dr. Audelia Hives on 12/10 at 1pm. Lft the new appt date and time on the pt's vm . New letter mailed.

## 2018-03-05 ENCOUNTER — Encounter: Payer: Medicare Other | Admitting: Hematology and Oncology

## 2018-03-10 LAB — HM MAMMOGRAPHY

## 2018-03-11 ENCOUNTER — Other Ambulatory Visit: Payer: Self-pay

## 2018-03-11 DIAGNOSIS — I70213 Atherosclerosis of native arteries of extremities with intermittent claudication, bilateral legs: Secondary | ICD-10-CM

## 2018-03-12 ENCOUNTER — Encounter (HOSPITAL_COMMUNITY): Payer: Medicare Other

## 2018-03-12 ENCOUNTER — Ambulatory Visit: Payer: Medicare Other | Admitting: Vascular Surgery

## 2018-03-18 ENCOUNTER — Encounter: Payer: Medicare Other | Admitting: Hematology and Oncology

## 2018-03-21 ENCOUNTER — Telehealth: Payer: Self-pay | Admitting: Hematology

## 2018-03-21 NOTE — Telephone Encounter (Signed)
Received a call from Mrs. Shumard on 12/12  To r/s her hematology appt. Pt has been rescheduled to see Dr. Irene Limbo on 1/15 at 1pm. Pt has agreed to the appt date and time.

## 2018-03-25 ENCOUNTER — Encounter: Payer: Self-pay | Admitting: Vascular Surgery

## 2018-03-25 ENCOUNTER — Ambulatory Visit (HOSPITAL_COMMUNITY)
Admission: RE | Admit: 2018-03-25 | Discharge: 2018-03-25 | Disposition: A | Discharge status: Home / Self Care | Payer: Medicare Other | Source: Ambulatory Visit | Attending: Vascular Surgery | Admitting: Vascular Surgery

## 2018-03-25 ENCOUNTER — Ambulatory Visit (INDEPENDENT_AMBULATORY_CARE_PROVIDER_SITE_OTHER): Payer: Medicare Other | Admitting: Vascular Surgery

## 2018-03-25 ENCOUNTER — Other Ambulatory Visit: Payer: Self-pay

## 2018-03-25 VITALS — BP 118/74 | HR 61 | Temp 97.4°F | Resp 20 | Ht <= 58 in | Wt 119.0 lb

## 2018-03-25 DIAGNOSIS — I70213 Atherosclerosis of native arteries of extremities with intermittent claudication, bilateral legs: Secondary | ICD-10-CM | POA: Diagnosis present

## 2018-03-25 NOTE — Progress Notes (Signed)
Patient name: [name] MRN: [id number] DOB: 1933-05-25 Sex: female  REASON FOR VISIT: 70-month follow-up right leg pain  HPI: [name] is a 84 y.o. female with multiple medical comorbidities who presents for interval six-month follow-up for atypical right leg pain after being followed by Dr. Bridgett Larsson.  He previously saw her in June 2018 at which time she had an ABI of 1 on the right with a triphasic waveform in the setting of C3 venous insufficiency.  He had recommended bilateral lower extremity compression.  He did not feel that her right leg pain was consistent with claudication or critical limb ischemia but did recommend six-month follow-up.  She presents today and states that her leg is about the same and mostly complains of some swelling in the right leg.  No overt claudication or rest pain symptoms.  Interestingly her ABI on the left is much lower at 0.5 but she has no symptoms in her left leg.  Past Medical History:  Diagnosis Date  . Arthritis   . Cancer [facility])    left breast cancer   . CHF (congestive heart failure) ([place])    PACEMAKER & DEFIB  . Complication of anesthesia    hypotensive after back surgery in 2006- reports on chart   . Depression   . Dyslipidemia   . Fainted 04/21/06   AT [place]  . GERD (gastroesophageal reflux disease)   . Headache(784.0)   . Hearing loss   . HLD (hyperlipidemia)   . Hypertension   . Hypothyroidism   . ICD (implantable cardiac defibrillator) in place    pt has pacer/icd  . ICD (implantable cardiac defibrillator), biventricular, in situ   . LBBB (left bundle branch block)   . Memory loss   . Nonischemic cardiomyopathy ([place])   . Normal coronary arteries    s/p cardiac cath 2007  . Pacemaker    Guidant Device  . Syncope   . Systolic CHF ([place])   . Vertigo   . Wears glasses     Past Surgical History:  Procedure Laterality Date  . BACK SURGERY     lumbar fusion   . BREAST SURGERY  2000   LUMP REMOVAL. STAGE 1 CANCER  .  CARDIAC CATHETERIZATION    . CATARACT EXTRACTION    . EYE SURGERY    . IMPLANTABLE CARDIOVERTER DEFIBRILLATOR GENERATOR CHANGE N/A 12/18/2012   Procedure: IMPLANTABLE CARDIOVERTER DEFIBRILLATOR GENERATOR CHANGE;  Surgeon: [name], MD;  Location[facility] CATH LAB;  Service: Cardiovascular;  Laterality: N/A;  . JOINT REPLACEMENT  06/14/01   right  . LUMBAR FUSION  2006  . MASS EXCISION  11/08/2011   Procedure: EXCISION MASS;  Surgeon: [name], MD;  Location: [facility] ORS;  Service: General;  Laterality: Left;  Excision Left Thigh Mass  . MASTECTOMY, PARTIAL  2008   GOT PACEMAKER AND DEFIB AT THAT TIME  . PACEMAKER INSERTION  04/23/06  . TOTAL KNEE ARTHROPLASTY  05/17/01   RIGHT KNEE  . TOTAL KNEE ARTHROPLASTY Left 11/29/2014   Procedure: TOTAL LEFT KNEE ARTHROPLASTY;  Surgeon: Paralee Cancel, MD;  Location: [facility] ORS;  Service: Orthopedics;  Laterality: Left;    Family History  Problem Relation Age of Onset  . Hypertension Mother   . Arthritis Mother   . Hypertension Father   . Hypertension Brother   . Hypertension Brother     SOCIAL HISTORY: Social History   Tobacco Use  . Smoking status: Never Smoker  . Smokeless tobacco: Never Used  Substance Use Topics  . Alcohol use: No    Allergies  Allergen Reactions  . Iodine Shortness Of Breath    Iodine contrast, CHF , SOB  . Shellfish Allergy Shortness Of Breath  . Memantine     Malaise, fogginess/ couldn't think  . Aspirin Nausea And Vomiting  . Codeine Nausea And Vomiting    Current Outpatient Medications  Medication Sig Dispense Refill  . acetaminophen (TYLENOL) 325 MG tablet Take 2 tablets (650 mg total) by mouth every 6 (six) hours as needed for mild pain (or Fever >/= 101).    Marland Kitchen buPROPion (WELLBUTRIN XL) 150 MG 24 hr tablet Take 3 tablets (450 mg total) by mouth daily. 270 tablet 3  . cholecalciferol (VITAMIN D) 1000 UNITS tablet Take 1,000 Units by mouth daily.    . furosemide (LASIX) 40 MG tablet Take 20 mg by mouth daily.      Marland Kitchen levothyroxine (SYNTHROID, LEVOTHROID) 100 MCG tablet Take 1 tablet (100 mcg total) by mouth daily before breakfast. 90 tablet 3  . Magnesium 250 MG TABS Take 250 mg by mouth daily.    . metoprolol tartrate (LOPRESSOR) 25 MG tablet TAKE 1 TABLET BY MOUTH  EVERY MORNING AND ONE-HALF  TABLET EVERY EVENING 135 tablet 1  . NONFORMULARY OR COMPOUNDED ITEM APPLY 1-2 GMS TO AFFECTED AREA 3-4 TIMES DAILY AS NEEDED FOR PAIN. THIS IS A COMPOUNDED CREAM.1 PUMP EQUALS 1 GRAM.  2  . Omega-3 Fatty Acids (FISH OIL) 1200 MG CAPS Take 1,200 mg by mouth every morning.    Marland Kitchen omeprazole (PRILOSEC) 40 MG capsule TAKE 1 CAPSULE BY MOUTH  DAILY FOR STOMACH 90 capsule 1  . Oxcarbazepine (TRILEPTAL) 300 MG tablet Take 300 mg by mouth every morning.     . potassium chloride (K-DUR,KLOR-CON) 10 MEQ tablet TAKE 1 TABLET BY MOUTH  DAILY 90 tablet 1  . sertraline (ZOLOFT) 100 MG tablet     . spironolactone (ALDACTONE) 25 MG tablet TAKE 1 TABLET BY MOUTH  EVERY MORNING 90 tablet 1  . tiZANidine (ZANAFLEX) 4 MG tablet TAKE 1 TABLET EVERY 6 HOURS AS NEEDED FOR MUSCLE SPASM. 60 tablet 2  . vitamin E 1000 UNIT capsule Take 1,000 Units by mouth daily.     No current facility-administered medications for this visit.     REVIEW OF SYSTEMS:  [X]  denotes positive finding, [ ]  denotes negative finding Cardiac  Comments:  Chest pain or chest pressure:    Shortness of breath upon exertion:    Short of breath when lying flat:    Irregular heart rhythm:        Vascular    Pain in calf, thigh, or hip brought on by ambulation:    Pain in feet at night that wakes you up from your sleep:     Blood clot in your veins:    Leg swelling:         Pulmonary    Oxygen at home:    Productive cough:     Wheezing:         Neurologic    Sudden weakness in arms or legs:     Sudden numbness in arms or legs:     Sudden onset of difficulty speaking or slurred speech:    Temporary loss of vision in one eye:     Problems with dizziness:          Gastrointestinal    Blood in stool:     Vomited blood:  Genitourinary    Burning when urinating:     Blood in urine:        Psychiatric    Major depression:         Hematologic    Bleeding problems:    Problems with blood clotting too easily:        Skin    Rashes or ulcers:        Constitutional    Fever or chills:      PHYSICAL EXAM: Vitals:   03/25/18 1333  BP: 118/74  Pulse: 61  Resp: 20  Temp: (!) 97.4 F (36.3 C)  SpO2: 96%  Weight: 119 lb (54 kg)  Height: 4\' 10"  (1.473 m)    GENERAL: The patient is a well-nourished female, in no acute distress. The vital signs are documented above. CARDIAC: There is a regular rate and rhythm.  VASCULAR:  Palpable right DP pulse, monophasic signals left Palpable femoral pulses bilaterally PULMONARY: There is good air exchange bilaterally without wheezing or rales. ABDOMEN: Soft and non-tender with normal pitched bowel sounds.  MUSCULOSKELETAL: There are no major deformities or cyanosis. NEUROLOGIC: No focal weakness or paresthesias are detected.  DATA:   ABI 1.07 on the right with triphasic waveform and 0.58 on the left with a monophasic waveform  Assessment/Plan:  84 year old female who presents for 44-month follow-up in the setting of atypical right lower extremity pain after previously being evaluated by Dr. Bridgett Larsson.  On my evaluation today she has a palpable right dorsalis pedis pulse with a normal ABI and a triphasic waveform.  I do not think her right leg symptoms are consistent with PAD as previously documented.  She does have some venous insufficiency and was previously recommended at compression and leg elevation.  I reiterated conservative measures with her.  She does have a lower ABI on the left of 0.58 with a monophasic waveform but has no symptoms in her left leg.  I offered her PRN follow-up and told her to call our clinic if she feels her symptoms are worse or change in character.   Marty Heck, MD [facility] Office: [phone] Pager: [place]

## 2018-03-31 ENCOUNTER — Ambulatory Visit (INDEPENDENT_AMBULATORY_CARE_PROVIDER_SITE_OTHER): Payer: Medicare Other

## 2018-03-31 VITALS — BP 132/66 | HR 74 | Temp 97.4°F | Ht <= 58 in | Wt 121.0 lb

## 2018-03-31 DIAGNOSIS — Z0000 Encounter for general adult medical examination without abnormal findings: Secondary | ICD-10-CM | POA: Diagnosis not present

## 2018-03-31 MED ORDER — ZOSTER VAC RECOMB ADJUVANTED 50 MCG/0.5ML IM SUSR: 0.5000 mL | Freq: Once | INTRAMUSCULAR | 1 refills | Status: AC

## 2018-03-31 NOTE — Patient Instructions (Addendum)
[name] , Thank you for taking time to come for your Medicare Wellness Visit. I appreciate your ongoing commitment to your health goals. Please review the following plan we discussed and let me know if I can assist you in the future.   Screening recommendations/referrals: Colonoscopy excluded, over age 82 Mammogram excluded, over age 103 Bone Density up to date Recommended yearly ophthalmology/optometry visit for glaucoma screening and checkup Recommended yearly dental visit for hygiene and checkup  Vaccinations: Influenza vaccine up to date Pneumococcal vaccine up to date, completed Tdap vaccine up to date, due 01/08/2023 Shingles vaccine due, ordered to pharmacy    Advanced directives: In chart  Conditions/risks identified: none  Next appointment: Sherrie Mustache, NP 04/14/2018 @ 10:45am            Prolia injection 04/28/2018 @ 9:45am            Tyson Dense RN 04/06/2019 @ 12:45pm   Preventive Care 27 Years and Older, Female Preventive care refers to lifestyle choices and visits with your health care provider that can promote health and wellness. What does preventive care include?  A yearly physical exam. This is also called an annual well check.  Dental exams once or twice a year.  Routine eye exams. Ask your health care provider how often you should have your eyes checked.  Personal lifestyle choices, including:  Daily care of your teeth and gums.  Regular physical activity.  Eating a healthy diet.  Avoiding tobacco and drug use.  Limiting alcohol use.  Practicing safe sex.  Taking low-dose aspirin every day.  Taking vitamin and mineral supplements as recommended by your health care provider. What happens during an annual well check? The services and screenings done by your health care provider during your annual well check will depend on your age, overall health, lifestyle risk factors, and family history of disease. Counseling  Your health care provider may  ask you questions about your:  Alcohol use.  Tobacco use.  Drug use.  Emotional well-being.  Home and relationship well-being.  Sexual activity.  Eating habits.  History of falls.  Memory and ability to understand (cognition).  Work and work Statistician.  Reproductive health. Screening  You may have the following tests or measurements:  Height, weight, and BMI.  Blood pressure.  Lipid and cholesterol levels. These may be checked every 5 years, or more frequently if you are over 51 years old.  Skin check.  Lung cancer screening. You may have this screening every year starting at age 37 if you have a 30-pack-year history of smoking and currently smoke or have quit within the past 15 years.  Fecal occult blood test (FOBT) of the stool. You may have this test every year starting at age 67.  Flexible sigmoidoscopy or colonoscopy. You may have a sigmoidoscopy every 5 years or a colonoscopy every 10 years starting at age 40.  Hepatitis C blood test.  Hepatitis B blood test.  Sexually transmitted disease (STD) testing.  Diabetes screening. This is done by checking your blood sugar (glucose) after you have not eaten for a while (fasting). You may have this done every 1-3 years.  Bone density scan. This is done to screen for osteoporosis. You may have this done starting at age 36.  Mammogram. This may be done every 1-2 years. Talk to your health care provider about how often you should have regular mammograms. Talk with your health care provider about your test results, treatment options, and if necessary, the  need for more tests. Vaccines  Your health care provider may recommend certain vaccines, such as:  Influenza vaccine. This is recommended every year.  Tetanus, diphtheria, and acellular pertussis (Tdap, Td) vaccine. You may need a Td booster every 10 years.  Zoster vaccine. You may need this after age 88.  Pneumococcal 13-valent conjugate (PCV13) vaccine. One  dose is recommended after age 16.  Pneumococcal polysaccharide (PPSV23) vaccine. One dose is recommended after age 50. Talk to your health care provider about which screenings and vaccines you need and how often you need them. This information is not intended to replace advice given to you by your health care provider. Make sure you discuss any questions you have with your health care provider. Document Released: 04/22/2015 Document Revised: 12/14/2015 Document Reviewed: 01/25/2015 Elsevier Interactive Patient Education  2017 [place] Prevention in the Home Falls can cause injuries. They can happen to people of all ages. There are many things you can do to make your home safe and to help prevent falls. What can I do on the outside of my home?  Regularly fix the edges of walkways and driveways and fix any cracks.  Remove anything that might make you trip as you walk through a door, such as a raised step or threshold.  Trim any bushes or trees on the path to your home.  Use bright outdoor lighting.  Clear any walking paths of anything that might make someone trip, such as rocks or tools.  Regularly check to see if handrails are loose or broken. Make sure that both sides of any steps have handrails.  Any raised decks and porches should have guardrails on the edges.  Have any leaves, snow, or ice cleared regularly.  Use sand or salt on walking paths during winter.  Clean up any spills in your garage right away. This includes oil or grease spills. What can I do in the bathroom?  Use night lights.  Install grab bars by the toilet and in the tub and shower. Do not use towel bars as grab bars.  Use non-skid mats or decals in the tub or shower.  If you need to sit down in the shower, use a plastic, non-slip stool.  Keep the floor dry. Clean up any water that spills on the floor as soon as it happens.  Remove soap buildup in the tub or shower regularly.  Attach bath  mats securely with double-sided non-slip rug tape.  Do not have throw rugs and other things on the floor that can make you trip. What can I do in the bedroom?  Use night lights.  Make sure that you have a light by your bed that is easy to reach.  Do not use any sheets or blankets that are too big for your bed. They should not hang down onto the floor.  Have a firm chair that has side arms. You can use this for support while you get dressed.  Do not have throw rugs and other things on the floor that can make you trip. What can I do in the kitchen?  Clean up any spills right away.  Avoid walking on wet floors.  Keep items that you use a lot in easy-to-reach places.  If you need to reach something above you, use a strong step stool that has a grab bar.  Keep electrical cords out of the way.  Do not use floor polish or wax that makes floors slippery. If you must use wax,  use non-skid floor wax.  Do not have throw rugs and other things on the floor that can make you trip. What can I do with my stairs?  Do not leave any items on the stairs.  Make sure that there are handrails on both sides of the stairs and use them. Fix handrails that are broken or loose. Make sure that handrails are as long as the stairways.  Check any carpeting to make sure that it is firmly attached to the stairs. Fix any carpet that is loose or worn.  Avoid having throw rugs at the top or bottom of the stairs. If you do have throw rugs, attach them to the floor with carpet tape.  Make sure that you have a light switch at the top of the stairs and the bottom of the stairs. If you do not have them, ask someone to add them for you. What else can I do to help prevent falls?  Wear shoes that:  Do not have high heels.  Have rubber bottoms.  Are comfortable and fit you well.  Are closed at the toe. Do not wear sandals.  If you use a stepladder:  Make sure that it is fully opened. Do not climb a closed  stepladder.  Make sure that both sides of the stepladder are locked into place.  Ask someone to hold it for you, if possible.  Clearly mark and make sure that you can see:  Any grab bars or handrails.  First and last steps.  Where the edge of each step is.  Use tools that help you move around (mobility aids) if they are needed. These include:  Canes.  Walkers.  Scooters.  Crutches.  Turn on the lights when you go into a dark area. Replace any light bulbs as soon as they burn out.  Set up your furniture so you have a clear path. Avoid moving your furniture around.  If any of your floors are uneven, fix them.  If there are any pets around you, be aware of where they are.  Review your medicines with your doctor. Some medicines can make you feel dizzy. This can increase your chance of falling. Ask your doctor what other things that you can do to help prevent falls. This information is not intended to replace advice given to you by your health care provider. Make sure you discuss any questions you have with your health care provider. Document Released: 01/20/2009 Document Revised: 09/01/2015 Document Reviewed: 04/30/2014 Elsevier Interactive Patient Education  2017 [employer].

## 2018-03-31 NOTE — Progress Notes (Signed)
Subjective:   [name] is a 84 y.o. female who presents for Medicare Annual (Subsequent) preventive examination.  Last AWV-03/27/2017    Objective:     Vitals: BP 132/66 (BP Location: Left Arm, Patient Position: Sitting)   Pulse 74   Temp (!) 97.4 F (36.3 C) (Oral)   Ht 4\' 10"  (1.473 m)   Wt 121 lb (54.9 kg)   SpO2 98%   BMI 25.29 kg/m   Body mass index is 25.29 kg/m.  Advanced Directives 03/25/2018 10/07/2017 03/27/2017 08/31/2016 07/26/2016 11/30/2015 10/05/2015  Does Patient Have a Medical Advance Directive? Yes Yes Yes Yes Yes Yes Yes  Type of Paramedic of [place];Living will Out of facility DNR (pink MOST or yellow form);Living will Living will;Out of facility DNR (pink MOST or yellow form) Out of facility DNR (pink MOST or yellow form) [place];Living will;Out of facility DNR (pink MOST or yellow form) Living will;Healthcare Power of Attorney Living will  Does patient want to make changes to medical advance directive? No - Patient declined - No - Patient declined No - Patient declined No - Patient declined No - Patient declined No - Patient declined  Copy of [place] in Chart? Yes - validated most recent copy scanned in chart (See row information) - - - - No - copy requested No - copy requested  Pre-existing out of facility DNR order (yellow form or pink MOST form) - Yellow form placed in chart (order not valid for inpatient use) Pink MOST form placed in chart (order not valid for inpatient use);Yellow form placed in chart (order not valid for inpatient use) Yellow form placed in chart (order not valid for inpatient use) - - -    Tobacco Social History   Tobacco Use  Smoking Status Never Smoker  Smokeless Tobacco Never Used     Counseling given: Not Answered   Clinical Intake:  Pre-visit preparation completed: No  Pain : 0-10 Pain Score: 7  Pain Location: Back Pain Orientation: Lower Pain  Descriptors / Indicators: Aching Pain Onset: More than a month ago Pain Frequency: Intermittent     Diabetes: No  How often do you need to have someone help you when you read instructions, pamphlets, or other written materials from your doctor or pharmacy?: 1 - Never What is the last grade level you completed in school?: Masters  Interpreter Needed?: No  Information entered by :: Tyson Dense, RN  Past Medical History:  Diagnosis Date  . Arthritis   . Cancer [facility])    left breast cancer   . CHF (congestive heart failure) ([place])    PACEMAKER & DEFIB  . Complication of anesthesia    hypotensive after back surgery in 2006- reports on chart   . Depression   . Dyslipidemia   . Fainted 04/21/06   AT [place]  . GERD (gastroesophageal reflux disease)   . Headache(784.0)   . Hearing loss   . HLD (hyperlipidemia)   . Hypertension   . Hypothyroidism   . ICD (implantable cardiac defibrillator) in place    pt has pacer/icd  . ICD (implantable cardiac defibrillator), biventricular, in situ   . LBBB (left bundle branch block)   . Memory loss   . Nonischemic cardiomyopathy ([place])   . Normal coronary arteries    s/p cardiac cath 2007  . Pacemaker    Guidant Device  . Syncope   . Systolic CHF ([place])   . Vertigo   .  Wears glasses    Past Surgical History:  Procedure Laterality Date  . BACK SURGERY     lumbar fusion   . BREAST SURGERY  2000   LUMP REMOVAL. STAGE 1 CANCER  . CARDIAC CATHETERIZATION    . CATARACT EXTRACTION    . EYE SURGERY    . IMPLANTABLE CARDIOVERTER DEFIBRILLATOR GENERATOR CHANGE N/A 12/18/2012   Procedure: IMPLANTABLE CARDIOVERTER DEFIBRILLATOR GENERATOR CHANGE;  Surgeon: [name], MD;  Location[facility] CATH LAB;  Service: Cardiovascular;  Laterality: N/A;  . JOINT REPLACEMENT  06/14/01   right  . LUMBAR FUSION  2006  . MASS EXCISION  11/08/2011   Procedure: EXCISION MASS;  Surgeon: [name], MD;  Location: [facility] ORS;  Service: General;  Laterality: Left;   Excision Left Thigh Mass  . MASTECTOMY, PARTIAL  2008   GOT PACEMAKER AND DEFIB AT THAT TIME  . PACEMAKER INSERTION  04/23/06  . TOTAL KNEE ARTHROPLASTY  05/17/01   RIGHT KNEE  . TOTAL KNEE ARTHROPLASTY Left 11/29/2014   Procedure: TOTAL LEFT KNEE ARTHROPLASTY;  Surgeon: Paralee Cancel, MD;  Location: [facility] ORS;  Service: Orthopedics;  Laterality: Left;   Family History  Problem Relation Age of Onset  . Hypertension Mother   . Arthritis Mother   . Hypertension Father   . Hypertension Brother   . Hypertension Brother    Social History   Socioeconomic History  . Marital status: Married    Spouse name: Not on file  . Number of children: 1  . Years of education: Masters  . Highest education level: Not on file  Occupational History  . Occupation: Retired  Scientific laboratory technician  . Financial resource strain: Not hard at all  . Food insecurity:    Worry: Never true    Inability: Never true  . Transportation needs:    Medical: No    Non-medical: No  Tobacco Use  . Smoking status: Never Smoker  . Smokeless tobacco: Never Used  Substance and Sexual Activity  . Alcohol use: No  . Drug use: No  . Sexual activity: Not Currently    Birth control/protection: Post-menopausal  Lifestyle  . Physical activity:    Days per week: 4 days    Minutes per session: 30 min  . Stress: To some extent  Relationships  . Social connections:    Talks on phone: More than three times a week    Gets together: Once a week    Attends religious service: More than 4 times per year    Active member of club or organization: Yes    Attends meetings of clubs or organizations: Never    Relationship status: Married  Other Topics Concern  . Not on file  Social History Narrative   Lives at home with husband.   Right-handed.      As of 07/28/2014   Diet: No special diet   Caffeine: yes, Chocolate, tea and sodas    Married: YES, 1970   House: Yes, 2 stories, 2-3 persons live in home   Pets: No   Current/Past  profession: Engineer, mining, Nurse Practitioner    Exercise: Yes 2-3 x weekly   Living Will: Yes   DNR: No   POA/HPOA: No       Outpatient Encounter Medications as of 03/31/2018  Medication Sig  . acetaminophen (TYLENOL) 325 MG tablet Take 2 tablets (650 mg total) by mouth every 6 (six) hours as needed for mild pain (or Fever >/= 101).  Marland Kitchen buPROPion (WELLBUTRIN XL) 150  MG 24 hr tablet Take 3 tablets (450 mg total) by mouth daily.  . cholecalciferol (VITAMIN D) 1000 UNITS tablet Take 1,000 Units by mouth daily.  . furosemide (LASIX) 40 MG tablet Take 20 mg by mouth daily.  Marland Kitchen levothyroxine (SYNTHROID, LEVOTHROID) 100 MCG tablet Take 1 tablet (100 mcg total) by mouth daily before breakfast.  . Magnesium 250 MG TABS Take 250 mg by mouth daily.  . metoprolol tartrate (LOPRESSOR) 25 MG tablet TAKE 1 TABLET BY MOUTH  EVERY MORNING AND ONE-HALF  TABLET EVERY EVENING  . NONFORMULARY OR COMPOUNDED ITEM APPLY 1-2 GMS TO AFFECTED AREA 3-4 TIMES DAILY AS NEEDED FOR PAIN. THIS IS A COMPOUNDED CREAM.1 PUMP EQUALS 1 GRAM.  . Omega-3 Fatty Acids (FISH OIL) 1200 MG CAPS Take 1,200 mg by mouth every morning.  Marland Kitchen omeprazole (PRILOSEC) 40 MG capsule TAKE 1 CAPSULE BY MOUTH  DAILY FOR STOMACH  . Oxcarbazepine (TRILEPTAL) 300 MG tablet Take 300 mg by mouth every morning.   . potassium chloride (K-DUR,KLOR-CON) 10 MEQ tablet TAKE 1 TABLET BY MOUTH  DAILY  . sertraline (ZOLOFT) 100 MG tablet   . spironolactone (ALDACTONE) 25 MG tablet TAKE 1 TABLET BY MOUTH  EVERY MORNING  . tiZANidine (ZANAFLEX) 4 MG tablet TAKE 1 TABLET EVERY 6 HOURS AS NEEDED FOR MUSCLE SPASM.  Marland Kitchen vitamin E 1000 UNIT capsule Take 1,000 Units by mouth daily.  Marland Kitchen Zoster Vaccine Adjuvanted [facility]) injection Inject 0.5 mLs into the muscle once for 1 dose.  . [DISCONTINUED] Zoster Vaccine Adjuvanted [facility]) injection Inject 0.5 mLs into the muscle once.   No facility-administered encounter medications on file as of 03/31/2018.     Activities of  Daily Living In your present state of health, do you have any difficulty performing the following activities: 03/31/2018  Hearing? N  Vision? N  Difficulty concentrating or making decisions? N  Walking or climbing stairs? N  Dressing or bathing? N  Doing errands, shopping? N  Preparing Food and eating ? N  Using the Toilet? N  In the past six months, have you accidently leaked urine? Y  Comment at night  Do you have problems with loss of bowel control? N  Managing your Medications? N  Managing your Finances? N  Housekeeping or managing your Housekeeping? N  Some recent data might be hidden    Patient Care Team: [name], NP as PCP - General (Geriatric Medicine) [name], MD as Consulting Physician (General Surgery) Paralee Cancel, MD as Consulting Physician (Orthopedic Surgery) Marica Otter, [place] (Optometry) Marcial Pacas, MD as Consulting Physician (Neurology) [name], MD as Consulting Physician (Cardiology)    Assessment:   This is a routine wellness examination for Vineta.  Exercise Activities and Dietary recommendations Current Exercise Habits: Structured exercise class, Type of exercise: strength training/weights;walking, Time (Minutes): 30, Frequency (Times/Week): 3, Weekly Exercise (Minutes/Week): 90, Intensity: Mild, Exercise limited by: None identified  Goals    . DIET - INCREASE WATER INTAKE     Pt will try to increase water intake to 3-4 cups a day       Fall Risk Fall Risk  03/31/2018 02/05/2018 10/07/2017 04/29/2017 03/27/2017  Falls in the past year? 1 Yes No No Yes  Number falls in past yr: 1 1 - - 2 or more  Injury with Fall? 0 No - - No  Comment - - - - -   Is the patient's home free of loose throw rugs in walkways, pet beds, electrical cords, etc?  yes      Grab bars in the bathroom? yes      Handrails on the stairs?   yes      Adequate lighting?   yes  Timed Get Up and Go performed: 12 seconds  Depression Screen PHQ 2/9 Scores  03/31/2018 03/27/2017 02/28/2016 10/05/2015  PHQ - 2 Score 1 4 0 2  PHQ- 9 Score - 7 - -     Cognitive Function completed within the last year MMSE - [place] Exam 01/29/2018 07/24/2017 01/16/2017 01/16/2017 07/16/2016  Not completed: (No Data) - - - -  Orientation to time 5 4 4 4 5   Orientation to Place 5 5 5 5 5   Registration 3 3 3 3 3   Attention/ Calculation 5 2 5 5 5   Recall 1 2 1 1 3   Language- name 2 objects 2 2 2 2 2   Language- repeat 1 1 1 1 1   Language- follow 3 step command 3 3 3 3 3   Language- read & follow direction 1 1 1 1 1   Write a sentence 1 1 1 1 1   Copy design 1 1 1 1 1   Total score 28 25 27 27 30         Immunization History  Administered Date(s) Administered  . Influenza, High Dose Seasonal PF 01/16/2018  . Influenza-Unspecified 12/08/2013, 01/10/2015, 12/09/2015, 01/12/2017  . Pneumococcal Conjugate-13 04/09/2012  . Pneumococcal Polysaccharide-23 02/28/2016  . Tdap 01/07/2013  . Zoster 08/05/2014    Qualifies for Shingles Vaccine? Yes, educated and ordered to pharmacy  Screening Tests Health Maintenance  Topic Date Due  . MAMMOGRAM  03/11/2019  . TETANUS/TDAP  01/08/2023  . INFLUENZA VACCINE  Completed  . DEXA SCAN  Completed  . PNA vac Low Risk Adult  Completed    Cancer Screenings: Lung: Low Dose CT Chest recommended if Age 9-80 years, 30 pack-year currently smoking OR have quit w/in 15years. Patient does not qualify. Breast:  Up to date on Mammogram? Yes   Up to date of Bone Density/Dexa? Yes Colorectal: up to date  Additional Screenings:  Hepatitis C Screening: declined     Plan:    I have personally reviewed and addressed the Medicare Annual Wellness questionnaire and have noted the following in the patient's chart:  A. Medical and social history B. Use of alcohol, tobacco or illicit drugs  C. Current medications and supplements D. Functional ability and status E.  Nutritional status F.  Physical activity G. Advance  directives H. List of other physicians I.  Hospitalizations, surgeries, and ER visits in previous 12 months J.  [place] to include hearing, vision, cognitive, depression L. Referrals and appointments - none  In addition, I have reviewed and discussed with patient certain preventive protocols, quality metrics, and best practice recommendations. A written personalized care plan for preventive services as well as general preventive health recommendations were provided to patient.  See attached scanned questionnaire for additional information.   Signed,   Tyson Dense, RN Nurse Health Advisor  Patient Concerns: low back pain is still bothering her

## 2018-04-08 LAB — CUP PACEART REMOTE DEVICE CHECK
Date Time Interrogation Session: 20191231090603
Implantable Lead Implant Date: 20080116
Implantable Lead Implant Date: 20080116
Implantable Lead Implant Date: 20080116
Implantable Lead Location: 753859
Implantable Lead Location: 753860
Implantable Lead Location: 753860
Implantable Lead Model: 157
Implantable Lead Model: 4469
Implantable Lead Model: 4555
Implantable Lead Serial Number: 136532
Implantable Lead Serial Number: 161542
Implantable Lead Serial Number: 473495
Implantable Pulse Generator Implant Date: 20140911
Pulse Gen Serial Number: 111765

## 2018-04-14 ENCOUNTER — Encounter: Payer: Self-pay | Admitting: Nurse Practitioner

## 2018-04-14 ENCOUNTER — Ambulatory Visit (INDEPENDENT_AMBULATORY_CARE_PROVIDER_SITE_OTHER): Payer: Medicare Other | Admitting: Nurse Practitioner

## 2018-04-14 VITALS — BP 120/74 | HR 68 | Temp 97.8°F | Ht <= 58 in | Wt 122.0 lb

## 2018-04-14 DIAGNOSIS — F3341 Major depressive disorder, recurrent, in partial remission: Secondary | ICD-10-CM | POA: Diagnosis not present

## 2018-04-14 DIAGNOSIS — G3184 Mild cognitive impairment, so stated: Secondary | ICD-10-CM

## 2018-04-14 DIAGNOSIS — I5022 Chronic systolic (congestive) heart failure: Secondary | ICD-10-CM

## 2018-04-14 DIAGNOSIS — R233 Spontaneous ecchymoses: Secondary | ICD-10-CM

## 2018-04-14 DIAGNOSIS — R238 Other skin changes: Secondary | ICD-10-CM | POA: Diagnosis not present

## 2018-04-14 DIAGNOSIS — E034 Atrophy of thyroid (acquired): Secondary | ICD-10-CM

## 2018-04-14 DIAGNOSIS — D692 Other nonthrombocytopenic purpura: Secondary | ICD-10-CM

## 2018-04-14 DIAGNOSIS — Z0000 Encounter for general adult medical examination without abnormal findings: Secondary | ICD-10-CM | POA: Diagnosis not present

## 2018-04-14 DIAGNOSIS — K5900 Constipation, unspecified: Secondary | ICD-10-CM

## 2018-04-14 DIAGNOSIS — I70213 Atherosclerosis of native arteries of extremities with intermittent claudication, bilateral legs: Secondary | ICD-10-CM

## 2018-04-14 DIAGNOSIS — M810 Age-related osteoporosis without current pathological fracture: Secondary | ICD-10-CM

## 2018-04-14 DIAGNOSIS — I10 Essential (primary) hypertension: Secondary | ICD-10-CM

## 2018-04-14 NOTE — Progress Notes (Signed)
.   Provider: [name], NP  Patient Care Team: [name], NP as PCP - General (Geriatric Medicine) [name], MD as Consulting Physician (General Surgery) Paralee Cancel, MD as Consulting Physician (Orthopedic Surgery) Marica Otter, [place] (Optometry) Marcial Pacas, MD as Consulting Physician (Neurology) [name], MD as Consulting Physician (Cardiology)  Extended Emergency Contact Information Primary Emergency Contact: Navratil,Basil Address: [address]          [place], [place] [address] Johnnette Litter of [place] Phone: [phone] Relation: Spouse Secondary Emergency Contact: Newton,Doris Address: [address]          [place], [place] [address] [place] of [place] Phone: [phone] Relation: Other Allergies  Allergen Reactions  . Iodine Shortness Of Breath    Iodine contrast, CHF , SOB  . Shellfish Allergy Shortness Of Breath  . Memantine     Malaise, fogginess/ couldn't think  . Aspirin Nausea And Vomiting  . Codeine Nausea And Vomiting   Code Status: DNR Goals of Care: Advanced Directive information Advanced Directives 04/14/2018  Does Patient Have a Medical Advance Directive? Yes  Type of Paramedic of [place];Living will;Out of facility DNR (pink MOST or yellow form)  Does patient want to make changes to medical advance directive? No - Patient declined  Copy of [place] in Chart? -  Pre-existing out of facility DNR order (yellow form or pink MOST form) Yellow form placed in chart (order not valid for inpatient use)     Chief Complaint  Patient presents with  . Annual Exam    CPE , MMSE 28/30 last done 01/29/18, EKG 05/16/17    HPI: Patient is a 85 y.o. female seen in today for an annual wellness exam.    Diet? Liberalized diet  Exercise?senior program on TV 3 times a week    Dentition: follows with dentist twice yearly  Ophthalmology appt: yearly  Routine specialist:  sees cardiologist yearly, neurologist twice yearly And saw vascular doctor but not seeing routinely  Chronic back pain - she has lumbar spondylosis and is s/p back sx; followed by Ortho Dr Nelva Bush. She receives steroid injections periodically. She has occasional pain in her anterior thigh b/l; occurs when she gets OOB and improves as day progresses. Last OV was 2-3 months ago but no improvement with injection.    GERD- stable on omeprazole.   PAD - vascular US that was neg for DVT. ABI left 0.55 (prev 0.56); right 1.06 (prev 1.03) in June 2019. She was told by vascular sx that she had varicose veins   Dementia/MCI - stable. She reports 5mg  aricept caused next day sedation and she stopped med. Followed by neuro Dr Krista Blue. MMSE 27/30. Weight has been down however stable today.  She was started on namenda 5mg  titration but she states 10mg  BID caused next day drowsiness and she reduced dose herself to 1 tab daily but now Is not taking anything at all. Overall, she has failed namenda, aricept and exelon patch 2/2 ADRs.  Depression - mood overall stable "up and down"  She is followed by pscyh Dr Reece Levy for psych. Currently taking sertraline, trileptal and wellbutrin xl. She has never had counseling.   Chronic systolic HF/AICD&pacer/LBBB/NICM - followed by cardiology Dr Lovena Le. She takes lasix with K supplement, lopressor, benicar, aldactone. EF 45-50% in 2015  (prev 20% in 2008). Pacer which is checked per cardiologist.   Hyperlipidemia - diet controlled. She also takes flax seed oil. LDL 100 in  October 2019   Hypothyroidism - stable on levothyroxine. TSH 1.56 in oct 2019   Osteoporosis - she started prolia injections in July 2019. She has GERD which would increase adverse events if she took an oral bisphosphonate. She also has dementia and compliance may be an issue with oral weekly medication. T score -2.8 on Oct 2018 DXA.   Low platelet count noted on last labs, hematologist referral was placed and she  has upcoming appt.    Depression screen [facility] 2/9 03/31/2018 03/27/2017 02/28/2016 10/05/2015 08/19/2015  Decreased Interest 0 1 0 1 0  Down, Depressed, Hopeless 1 3 0 1 0  PHQ - 2 Score 1 4 0 2 0  Altered sleeping - 0 - - -  Tired, decreased energy - 2 - - -  Change in appetite - 0 - - -  Feeling bad or failure about yourself  - 0 - - -  Trouble concentrating - 1 - - -  Moving slowly or fidgety/restless - 0 - - -  Suicidal thoughts - 0 - - -  PHQ-9 Score - 7 - - -  Difficult doing work/chores - Somewhat difficult - - -  Some recent data might be hidden    Fall Risk  04/14/2018 03/31/2018 02/05/2018 10/07/2017 04/29/2017  Falls in the past year? 1 1 Yes No No  Number falls in past yr: 0 1 1 - -  Injury with Fall? 0 0 No - -  Comment - - - - -   MMSE - Mini Mental State Exam 01/29/2018 07/24/2017 01/16/2017 01/16/2017 07/16/2016 01/13/2016 01/20/2015  Not completed: (No Data) - - - - - -  Orientation to time 5 4 4 4 5 5 4   Orientation to Place 5 5 5 5 5 5 5   Registration 3 3 3 3 3 3 3   Attention/ Calculation 5 2 5 5 5 5 5   Recall 1 2 1 1 3 2 2   Language- name 2 objects 2 2 2 2 2 2 2   Language- repeat 1 1 1 1 1 1 1   Language- follow 3 step command 3 3 3 3 3 3 2   Language- read & follow direction 1 1 1 1 1 1  0  Write a sentence 1 1 1 1 1 1 1   Copy design 1 1 1 1 1 1 1   Total score 28 25 27 27 30 29 26      Health Maintenance  Topic Date Due  . MAMMOGRAM  03/11/2019  . TETANUS/TDAP  01/08/2023  . INFLUENZA VACCINE  Completed  . DEXA SCAN  Completed  . PNA vac Low Risk Adult  Completed     Past Medical History:  Diagnosis Date  . Arthritis   . Cancer [facility])    left breast cancer   . CHF (congestive heart failure) ([place])    PACEMAKER & DEFIB  . Complication of anesthesia    hypotensive after back surgery in 2006- reports on chart   . Depression   . Dyslipidemia   . Fainted 04/21/06   AT [place]  . GERD (gastroesophageal reflux disease)   . Headache(784.0)   . Hearing loss     . HLD (hyperlipidemia)   . Hypertension   . Hypothyroidism   . ICD (implantable cardiac defibrillator) in place    pt has pacer/icd  . ICD (implantable cardiac defibrillator), biventricular, in situ   . LBBB (left bundle branch block)   . Memory loss   . Nonischemic cardiomyopathy ([place])   .  Normal coronary arteries    s/p cardiac cath 2007  . Pacemaker    Guidant Device  . Syncope   . Systolic CHF ([place] [employer])   . Vertigo   . Wears glasses     Past Surgical History:  Procedure Laterality Date  . BACK SURGERY     lumbar fusion   . BREAST SURGERY  2000   LUMP REMOVAL. STAGE 1 CANCER  . CARDIAC CATHETERIZATION    . CATARACT EXTRACTION    . EYE SURGERY    . IMPLANTABLE CARDIOVERTER DEFIBRILLATOR GENERATOR CHANGE N/A 12/18/2012   Procedure: IMPLANTABLE CARDIOVERTER DEFIBRILLATOR GENERATOR CHANGE;  Surgeon: [name], MD;  Location: [facility] CATH LAB;  Service: Cardiovascular;  Laterality: N/A;  . JOINT REPLACEMENT  06/14/01   right  . LUMBAR FUSION  2006  . MASS EXCISION  11/08/2011   Procedure: EXCISION MASS;  Surgeon: [name], MD;  Location: [facility] ORS;  Service: General;  Laterality: Left;  Excision Left Thigh Mass  . MASTECTOMY, PARTIAL  2008   GOT PACEMAKER AND DEFIB AT THAT TIME  . PACEMAKER INSERTION  04/23/06  . TOTAL KNEE ARTHROPLASTY  05/17/01   RIGHT KNEE  . TOTAL KNEE ARTHROPLASTY Left 11/29/2014   Procedure: TOTAL LEFT KNEE ARTHROPLASTY;  Surgeon: Paralee Cancel, MD;  Location: [facility] ORS;  Service: Orthopedics;  Laterality: Left;    Social History   Socioeconomic History  . Marital status: Married    Spouse name: Not on file  . Number of children: 1  . Years of education: Masters  . Highest education level: Not on file  Occupational History  . Occupation: Retired  Scientific laboratory technician  . Financial resource strain: Not hard at all  . Food insecurity:    Worry: Never true    Inability: Never true  . Transportation needs:    Medical: No    Non-medical: No  Tobacco Use  .  Smoking status: Never Smoker  . Smokeless tobacco: Never Used  Substance and Sexual Activity  . Alcohol use: No  . Drug use: No  . Sexual activity: Not Currently    Birth control/protection: Post-menopausal  Lifestyle  . Physical activity:    Days per week: 4 days    Minutes per session: 30 min  . Stress: To some extent  Relationships  . Social connections:    Talks on phone: More than three times a week    Gets together: Once a week    Attends religious service: More than 4 times per year    Active member of club or organization: Yes    Attends meetings of clubs or organizations: Never    Relationship status: Married  Other Topics Concern  . Not on file  Social History Narrative   Lives at home with husband.   Right-handed.      As of 07/28/2014   Diet: No special diet   Caffeine: yes, Chocolate, tea and sodas    Married: YES, 1970   House: Yes, 2 stories, 2-3 persons live in home   Pets: No   Current/Past profession: Engineer, mining, Designer, jewellery    Exercise: Yes 2-3 x weekly   Living Will: Yes   DNR: No   POA/HPOA: No       Family History  Problem Relation Age of Onset  . Hypertension Mother   . Arthritis Mother   . Hypertension Father   . Hypertension Brother   . Hypertension Brother     Review of Systems:  Review of Systems  Constitutional: Negative for chills, fever and weight loss.  HENT: Negative for tinnitus.   Respiratory: Negative for cough, sputum production and shortness of breath.   Cardiovascular: Negative for chest pain, palpitations and leg swelling.  Gastrointestinal: Negative for abdominal pain, constipation, diarrhea and heartburn.  Genitourinary: Negative for dysuria, frequency and urgency.  Musculoskeletal: Positive for back pain and joint pain. Negative for falls and myalgias.  Skin: Negative.   Neurological: Negative for dizziness and headaches.  Psychiatric/Behavioral: Positive for memory loss. Negative for depression. The  patient does not have insomnia.     Allergies as of 04/14/2018      Reactions   Iodine Shortness Of Breath   Iodine contrast, CHF , SOB   Shellfish Allergy Shortness Of Breath   Memantine    Malaise, fogginess/ couldn't think   Aspirin Nausea And Vomiting   Codeine Nausea And Vomiting      Medication List       Accurate as of April 14, 2018 11:12 AM. Always use your most recent med list.        acetaminophen 325 MG tablet Commonly known as:  TYLENOL Take 2 tablets (650 mg total) by mouth every 6 (six) hours as needed for mild pain (or Fever >/= 101).   buPROPion 150 MG 24 hr tablet Commonly known as:  WELLBUTRIN XL Take 3 tablets (450 mg total) by mouth daily.   cholecalciferol 25 MCG (1000 UT) tablet Commonly known as:  VITAMIN D Take 1,000 Units by mouth daily.   Fish Oil 1200 MG Caps Take 1,200 mg by mouth every morning.   furosemide 40 MG tablet Commonly known as:  LASIX Take 20 mg by mouth daily.   levothyroxine 100 MCG tablet Commonly known as:  SYNTHROID, LEVOTHROID Take 1 tablet (100 mcg total) by mouth daily before breakfast.   Magnesium 250 MG Tabs Take 250 mg by mouth daily.   metoprolol tartrate 25 MG tablet Commonly known as:  LOPRESSOR TAKE 1 TABLET BY MOUTH  EVERY MORNING AND ONE-HALF  TABLET EVERY EVENING   NONFORMULARY OR COMPOUNDED ITEM APPLY 1-2 GMS TO AFFECTED AREA 3-4 TIMES DAILY AS NEEDED FOR PAIN. THIS IS A COMPOUNDED CREAM.1 PUMP EQUALS 1 GRAM.   omeprazole 40 MG capsule Commonly known as:  PRILOSEC TAKE 1 CAPSULE BY MOUTH  DAILY FOR STOMACH   Oxcarbazepine 300 MG tablet Commonly known as:  TRILEPTAL Take 300 mg by mouth every morning.   potassium chloride 10 MEQ tablet Commonly known as:  K-DUR,KLOR-CON TAKE 1 TABLET BY MOUTH  DAILY   sertraline 100 MG tablet Commonly known as:  ZOLOFT Take 200 mg by mouth daily.   spironolactone 25 MG tablet Commonly known as:  ALDACTONE TAKE 1 TABLET BY MOUTH  EVERY MORNING     tiZANidine 4 MG tablet Commonly known as:  ZANAFLEX TAKE 1 TABLET EVERY 6 HOURS AS NEEDED FOR MUSCLE SPASM.   vitamin E 1000 UNIT capsule Take 1,000 Units by mouth daily.         Physical Exam: Vitals:   04/14/18 1054  BP: 120/74  Pulse: 68  Temp: 97.8 F (36.6 C)  TempSrc: Oral  SpO2: 97%  Weight: 122 lb (55.3 kg)  Height: 4\' 10"  (1.473 m)   Body mass index is 25.5 kg/m. Physical Exam Constitutional:      Appearance: Normal appearance. She is well-developed.  HENT:     Mouth/Throat:     Pharynx: No oropharyngeal exudate.  Eyes:     General: No scleral  icterus.    Pupils: Pupils are equal, round, and reactive to light.  Neck:     Musculoskeletal: Neck supple.     Thyroid: No thyromegaly.     Vascular: No carotid bruit.     Trachea: No tracheal deviation.  Cardiovascular:     Rate and Rhythm: Normal rate and regular rhythm.     Pulses:          Dorsalis pedis pulses are 2+ on the right side and 1+ on the left side.       Posterior tibial pulses are 2+ on the right side and 1+ on the left side.     Heart sounds: Murmur (1/6 SEM) present. No friction rub. No gallop.      Comments: Trace LE edema b/l; no calf TTP; varicose veins palpable Pulmonary:     Effort: Pulmonary effort is normal. No respiratory distress.     Breath sounds: Normal breath sounds. No stridor. No wheezing or rales.  Abdominal:     General: Bowel sounds are normal. There is no distension.     Palpations: Abdomen is soft. Abdomen is not rigid. There is no hepatomegaly or mass.     Tenderness: There is no abdominal tenderness. There is no guarding or rebound.     Hernia: No hernia is present.  Lymphadenopathy:     Cervical: No cervical adenopathy.  Skin:    General: Skin is warm and dry.     Findings: No rash.     Comments: Senile purpura left forearm  Neurological:     Mental Status: She is alert.     Deep Tendon Reflexes: Reflexes are normal and symmetric.  Psychiatric:         Behavior: Behavior normal.        Thought Content: Thought content normal.        Judgment: Judgment normal.     Labs reviewed: Basic Metabolic Panel: Recent Labs    08/30/17 1251 02/05/18 1004  NA 141 142  K 5.1 4.5  CL 104 106  CO2 30 30  GLUCOSE 77 88  BUN 27* 30*  CREATININE 0.99* 0.96*  CALCIUM 10.6* 10.2  TSH 2.21 1.56   Liver Function Tests: Recent Labs    08/30/17 1251 02/05/18 1004  AST  --  16  ALT 17 22  BILITOT  --  0.3  PROT  --  6.7   No results for input(s): LIPASE, AMYLASE in the last 8760 hours. No results for input(s): AMMONIA in the last 8760 hours. CBC: Recent Labs    08/30/17 1251 02/05/18 1004  WBC 8.6 9.2  NEUTROABS 5,289 6,256  HGB 12.8 13.1  HCT 38.3 40.5  MCV 85.1 86.5  PLT 134* 117*   Lipid Panel: Recent Labs    08/30/17 1251 02/05/18 1004  CHOL 181 175  HDL 56 54  LDLCALC 98 100*  TRIG 175* 110  CHOLHDL 3.2 3.2   No results found for: HGBA1C  Procedures: Vas [place] Abi With/wo Tbi  Result Date: 03/25/2018 LOWER EXTREMITY DOPPLER STUDY Indications: Claudication. High Risk Factors: Hypertension, hyperlipidemia, no history of smoking.  Performing Technologist: Delorise Shiner RVT  Examination Guidelines: A complete evaluation includes at minimum, Doppler waveform signals and systolic blood pressure reading at the level of bilateral brachial, anterior tibial, and posterior tibial arteries, when vessel segments are accessible. Bilateral testing is considered an integral part of a complete examination. Photoelectric Plethysmograph (PPG) waveforms and toe systolic pressure readings are included as required and additional  duplex testing as needed. Limited examinations for reoccurring indications may be performed as noted.  ABI Findings: +---------+------------------+-----+---------+--------+ Right    Rt Pressure (mmHg)IndexWaveform Comment  +---------+------------------+-----+---------+--------+ Brachial 153                                       +---------+------------------+-----+---------+--------+ ATA      155               1.01 triphasic         +---------+------------------+-----+---------+--------+ PTA      164               1.07 triphasic         +---------+------------------+-----+---------+--------+ Great Toe92                0.60                   +---------+------------------+-----+---------+--------+ +---------+------------------+-----+----------+-------+ Left     Lt Pressure (mmHg)IndexWaveform  Comment +---------+------------------+-----+----------+-------+ Brachial 147                                      +---------+------------------+-----+----------+-------+ ATA      83                0.54 monophasic        +---------+------------------+-----+----------+-------+ PTA      89                0.58 monophasic        +---------+------------------+-----+----------+-------+ Great Toe49                0.32                   +---------+------------------+-----+----------+-------+ +-------+-----------+-----------+------------+------------+ ABI/TBIToday's ABIToday's TBIPrevious ABIPrevious TBI +-------+-----------+-----------+------------+------------+ Right  1.07       0.60       1.06        0.51         +-------+-----------+-----------+------------+------------+ Left   0.58       0.32       0.53        0.29         +-------+-----------+-----------+------------+------------+ Bilateral ABIs appear essentially unchanged compared to prior study on 09/18/2017.  Summary: Right: Resting right ankle-brachial index is within normal range. No evidence of significant right lower extremity arterial disease. The right toe-brachial index is abnormal. RT great toe pressure = 92 mmHg. Left: Resting left ankle-brachial index indicates moderate left lower extremity arterial disease. The left toe-brachial index is abnormal. LT Great toe pressure = 49 mmHg.  *See table(s) above for  measurements and observations.  Electronically signed by Monica Martinez MD on 03/25/2018 at 1:47:28 PM.   Final     Assessment/Plan 1. Preventative health care -awv up to date, information provided on preventive health care - DNR (Do Not Resuscitate)  2. Easy bruising -noted, has hematologist appt pending, plt count has been low, 117 on most recent labs  3. Hypothyroidism due to acquired atrophy of thyroid TSH stable on recent lab, continue synthroid   4. Mild cognitive impairment Noted, MMSE has been stable. Unable to tolerate medication due to side effects  5. Recurrent major depressive disorder, in partial remission (HCC) Stable, reports symptoms go and come. Continues to follow up with psychiatrist who prescribes her psych medications  6. Age-related  osteoporosis without current pathological fracture -continues on prolia, next injection scheduled for this month.  7. Benign hypertension Stable, will continue current regimen  8. Constipation, unspecified constipation type Controlled on supplement.   9. Chronic systolic heart failure (HCC) Stable, euvolemic, continues to follow up with cardiology routinely  10. Atherosclerosis of native artery of both lower extremities with intermittent claudication (HCC) Stable, following with vascular.   11. Senile purpura ([place]) Noted  Next appt: 3 months.  Carlos American. [place], [facility] [phone]

## 2018-04-14 NOTE — Patient Instructions (Addendum)
Go ahead and make appt with JESSICA FOR AWV AND CPE FOR 1 year Also make 3 month follow up for routine visit CANCEL appt with sara.    Get These Tests  Blood Pressure- Have your blood pressure checked by your healthcare provider at least once a year.  Normal blood pressure is 120/80. Goal for most <140/90.  Weight- Have your body mass index (BMI) calculated to screen for obesity.  BMI is a measure of body fat based on height and weight.  You can calculate your own BMI at [employer].it  Cholesterol- Have your cholesterol checked every year.  Diabetes- Have your blood sugar checked every year if you have high blood pressure, high cholesterol, a family history of diabetes or if you are overweight.  Pap Test - Have a pap test every 1 to 5 years if you have been sexually active.  If you are older than 65 and recent pap tests have been normal you may not need additional pap tests.  In addition, if you have had a hysterectomy  for benign disease additional pap tests are not necessary.  Mammogram-Yearly mammograms are essential for early detection of breast cancer  Screening for Colon Cancer- Colonoscopy starting at age 74. Screening may begin sooner depending on your family history and other health conditions.  Follow up colonoscopy as directed by your Gastroenterologist.  Screening for Osteoporosis- Screening begins at age 52 with bone density scanning, sooner if you are at higher risk for developing Osteoporosis.   Get these medicines  Calcium with Vitamin D- Your body requires 1200-1500 mg of Calcium a day and [phone] IU of Vitamin D a day.  You can only absorb 500 mg of Calcium at a time therefore Calcium must be taken in 2 or 3 separate doses throughout the day.  Hormones- Hormone therapy has been associated with increased risk for certain cancers and heart disease.  Talk to your healthcare provider about if you need relief from menopausal symptoms.  Aspirin- Ask your  healthcare provider about taking Aspirin to prevent Heart Disease and Stroke.   Get these Immuniztions  Flu shot- Every fall  Pneumonia shot- Once after the age of 69; if you are younger ask your healthcare provider if you need a pneumonia shot.  Tetanus- Every ten years.  Shingrix- Once after the age of 16 to prevent shingles.   Take these steps  Don't smoke- Your healthcare provider can help you quit. For tips on how to quit, ask your healthcare provider or go to www.smokefree.gov or call 1-800 QUIT-NOW.  Be physically active- Exercise 5 days a week for a minimum of 30 minutes.  If you are not already physically active, start slow and gradually work up to 30 minutes of moderate physical activity.  Try walking, dancing, bike riding, swimming, etc.  Eat a healthy diet- Eat a variety of healthy foods such as fruits, vegetables, whole grains, low fat milk, low fat cheeses, yogurt, lean meats, chicken, fish, eggs, dried beans, tofu, etc.  For more information go to www.thenutritionsource.org  Dental visit- Brush and floss teeth twice daily; visit your dentist twice a year.  Eye exam- Visit your Optometrist or Ophthalmologist yearly.  Drink alcohol in moderation- Limit alcohol intake to one drink or less a day.  Never drink and drive.  Depression- Your emotional health is as important as your physical health.  If you're feeling down or losing interest in things you normally enjoy, please talk to your healthcare provider.  Seat Belts- can save  your life; always wear one  Smoke/Carbon Monoxide detectors- These detectors need to be installed on the appropriate level of your home.  Replace batteries at least once a year.  Violence- If anyone is threatening or hurting you, please tell your healthcare provider.  Living Will/ Health care power of attorney- Discuss with your healthcare provider and family.

## 2018-04-23 ENCOUNTER — Inpatient Hospital Stay: Payer: Medicare Other | Attending: Hematology and Oncology | Admitting: Hematology

## 2018-04-23 VITALS — BP 116/68 | HR 66 | Temp 98.0°F | Resp 18 | Ht <= 58 in | Wt 120.0 lb

## 2018-04-23 DIAGNOSIS — R238 Other skin changes: Secondary | ICD-10-CM

## 2018-04-23 DIAGNOSIS — R233 Spontaneous ecchymoses: Secondary | ICD-10-CM

## 2018-04-23 DIAGNOSIS — Z79899 Other long term (current) drug therapy: Secondary | ICD-10-CM | POA: Insufficient documentation

## 2018-04-23 DIAGNOSIS — D692 Other nonthrombocytopenic purpura: Secondary | ICD-10-CM | POA: Insufficient documentation

## 2018-04-23 DIAGNOSIS — I10 Essential (primary) hypertension: Secondary | ICD-10-CM | POA: Diagnosis not present

## 2018-04-23 NOTE — Progress Notes (Signed)
HEMATOLOGY/ONCOLOGY CONSULTATION NOTE  Date of Service: 04/23/2018  Patient Care Team: [name], NP as PCP - General (Geriatric Medicine) [name], MD as Consulting Physician (General Surgery) Paralee Cancel, MD as Consulting Physician (Orthopedic Surgery) Marica Otter, [place] (Optometry) Marcial Pacas, MD as Consulting Physician (Neurology) [name], MD as Consulting Physician (Cardiology)  CHIEF COMPLAINTS/PURPOSE OF CONSULTATION:  Easily bruising  HISTORY OF PRESENTING ILLNESS:   [name] is a wonderful 85 y.o. female who has been referred to [place] by Sherrie Mustache, NP for evaluation and management of easily bruising. The pt reports that she is doing well overall.   The pt reports that she has seen bruises on her arm, and denies bumping into things or any trauma. The pt notes that her bruises are solely located to her upper extremities. She denies concerns for bleeding in her joints, blood in the stools, blood in the urine, nose bleeds or gum bleeds. She notes she has bruised easily for about 2 years. She also notes that she began taking Zoloft about two years ago, and fish oil 4-5 years ago. She is not on any blood thinners. She denies taking any other new medications in the last couple years. The pt denies any thick bruises at any time. The pt denies excessive bleeding with pervious surgeries and dental extractions. The pt denies heavy periods when she was younger.  She has had two knee replacements and took Tramadol for her pain. She denies needing to use this frequently, and takes Tylenol for mild pains.    The pt takes Vitamin E oil for hot flashes, and notes that her Vitamin E oil successfully resolved her hot flashes. She began taking Vitamin E about 18 months ago.   The pt notes that she has lost about 20 pounds over two years. The pt reports some constipation and denies difficulty swallowing, and weak appetite. The pt denies any dietary restrictions. She  continues annual mammograms. She has a history of left sided breast cancer treated with a lumpectomy and radiation.  She notes that her energy levels have also decreased in the last 6 months, and "feels tired all the time." She endorses feeling well rested after taking naps. She notes that she does feel depressed but that taking Zoloft keeps her "afloat." She notes that she feels "medium" enjoyment in her activities. She sees psychiatry every 6 months.  The pt lives with her husband.   Most recent lab results (02/05/18) of CBC w/diff and CMP is as follows: all values are WNL except for PLT at 117k, BUN at 30, Creatinine at 0.96.  On review of systems, pt reports some stable depression, easily bruising on upper extremities, some weight loss, and denies nose bleeds, gum bleeds, blood in the urine, blood in the stools, abdominal pains, leg swelling, and any other symptoms.   On Family Hx the pt denies bleeding disorders.   MEDICAL HISTORY:  Past Medical History:  Diagnosis Date  . Arthritis   . Cancer [facility])    left breast cancer   . CHF (congestive heart failure) ([place])    PACEMAKER & DEFIB  . Complication of anesthesia    hypotensive after back surgery in 2006- reports on chart   . Depression   . Dyslipidemia   . Fainted 04/21/06   AT [place]  . GERD (gastroesophageal reflux disease)   . Headache(784.0)   . Hearing loss   . HLD (hyperlipidemia)   . Hypertension   . Hypothyroidism   .  ICD (implantable cardiac defibrillator) in place    pt has pacer/icd  . ICD (implantable cardiac defibrillator), biventricular, in situ   . LBBB (left bundle branch block)   . Memory loss   . Nonischemic cardiomyopathy ([place])   . Normal coronary arteries    s/p cardiac cath 2007  . Pacemaker    Guidant Device  . Syncope   . Systolic CHF ([place])   . Vertigo   . Wears glasses     SURGICAL HISTORY: Past Surgical History:  Procedure Laterality Date  . BACK SURGERY     lumbar fusion   . BREAST  SURGERY  2000   LUMP REMOVAL. STAGE 1 CANCER  . CARDIAC CATHETERIZATION    . CATARACT EXTRACTION    . EYE SURGERY    . IMPLANTABLE CARDIOVERTER DEFIBRILLATOR GENERATOR CHANGE N/A 12/18/2012   Procedure: IMPLANTABLE CARDIOVERTER DEFIBRILLATOR GENERATOR CHANGE;  Surgeon: [name], MD;  Location: [facility] CATH LAB;  Service: Cardiovascular;  Laterality: N/A;  . JOINT REPLACEMENT  06/14/01   right  . LUMBAR FUSION  2006  . MASS EXCISION  11/08/2011   Procedure: EXCISION MASS;  Surgeon: [name], MD;  Location: [facility] ORS;  Service: General;  Laterality: Left;  Excision Left Thigh Mass  . MASTECTOMY, PARTIAL  2008   GOT PACEMAKER AND DEFIB AT THAT TIME  . PACEMAKER INSERTION  04/23/06  . TOTAL KNEE ARTHROPLASTY  05/17/01   RIGHT KNEE  . TOTAL KNEE ARTHROPLASTY Left 11/29/2014   Procedure: TOTAL LEFT KNEE ARTHROPLASTY;  Surgeon: Paralee Cancel, MD;  Location: [facility] ORS;  Service: Orthopedics;  Laterality: Left;    SOCIAL HISTORY: Social History   Socioeconomic History  . Marital status: Married    Spouse name: Not on file  . Number of children: 1  . Years of education: Masters  . Highest education level: Not on file  Occupational History  . Occupation: Retired  Scientific laboratory technician  . Financial resource strain: Not hard at all  . Food insecurity:    Worry: Never true    Inability: Never true  . Transportation needs:    Medical: No    Non-medical: No  Tobacco Use  . Smoking status: Never Smoker  . Smokeless tobacco: Never Used  Substance and Sexual Activity  . Alcohol use: No  . Drug use: No  . Sexual activity: Not Currently    Birth control/protection: Post-menopausal  Lifestyle  . Physical activity:    Days per week: 4 days    Minutes per session: 30 min  . Stress: To some extent  Relationships  . Social connections:    Talks on phone: More than three times a week    Gets together: Once a week    Attends religious service: More than 4 times per year    Active member of club or  organization: Yes    Attends meetings of clubs or organizations: Never    Relationship status: Married  . Intimate partner violence:    Fear of current or ex partner: No    Emotionally abused: No    Physically abused: No    Forced sexual activity: No  Other Topics Concern  . Not on file  Social History Narrative   Lives at home with husband.   Right-handed.      As of 07/28/2014   Diet: No special diet   Caffeine: yes, Chocolate, tea and sodas    Married: YES, 1970   House: Yes, 2 stories, 2-3 persons live in home  Pets: No   Current/Past profession: Engineer, mining, Nurse Practitioner    Exercise: Yes 2-3 x weekly   Living Will: Yes   DNR: No   POA/HPOA: No       FAMILY HISTORY: Family History  Problem Relation Age of Onset  . Hypertension Mother   . Arthritis Mother   . Hypertension Father   . Hypertension Brother   . Hypertension Brother     ALLERGIES:  is allergic to iodine; shellfish allergy; memantine; aspirin; and codeine.  MEDICATIONS:  Current Outpatient Medications  Medication Sig Dispense Refill  . acetaminophen (TYLENOL) 325 MG tablet Take 2 tablets (650 mg total) by mouth every 6 (six) hours as needed for mild pain (or Fever >/= 101).    Marland Kitchen buPROPion (WELLBUTRIN XL) 150 MG 24 hr tablet Take 3 tablets (450 mg total) by mouth daily. 270 tablet 3  . cholecalciferol (VITAMIN D) 1000 UNITS tablet Take 1,000 Units by mouth daily.    . furosemide (LASIX) 40 MG tablet Take 20 mg by mouth daily.    Marland Kitchen levothyroxine (SYNTHROID, LEVOTHROID) 100 MCG tablet Take 1 tablet (100 mcg total) by mouth daily before breakfast. 90 tablet 3  . Magnesium 250 MG TABS Take 250 mg by mouth daily.    . metoprolol tartrate (LOPRESSOR) 25 MG tablet TAKE 1 TABLET BY MOUTH  EVERY MORNING AND ONE-HALF  TABLET EVERY EVENING 135 tablet 1  . NONFORMULARY OR COMPOUNDED ITEM APPLY 1-2 GMS TO AFFECTED AREA 3-4 TIMES DAILY AS NEEDED FOR PAIN. THIS IS A COMPOUNDED CREAM.1 PUMP EQUALS 1 GRAM.  2  .  Omega-3 Fatty Acids (FISH OIL) 1200 MG CAPS Take 1,200 mg by mouth every morning.    Marland Kitchen omeprazole (PRILOSEC) 40 MG capsule TAKE 1 CAPSULE BY MOUTH  DAILY FOR STOMACH 90 capsule 1  . Oxcarbazepine (TRILEPTAL) 300 MG tablet Take 300 mg by mouth every morning.     . potassium chloride (K-DUR,KLOR-CON) 10 MEQ tablet TAKE 1 TABLET BY MOUTH  DAILY 90 tablet 1  . sertraline (ZOLOFT) 100 MG tablet Take 200 mg by mouth daily.     Marland Kitchen spironolactone (ALDACTONE) 25 MG tablet TAKE 1 TABLET BY MOUTH  EVERY MORNING 90 tablet 1  . vitamin E 1000 UNIT capsule Take 1,000 Units by mouth daily.     No current facility-administered medications for this visit.     REVIEW OF SYSTEMS:    10 Point review of Systems was done is negative except as noted above.  PHYSICAL EXAMINATION  . Vitals:   04/23/18 1311  BP: 116/68  Pulse: 66  Resp: 18  Temp: 98 F (36.7 C)  SpO2: 98%   Filed Weights   04/23/18 1311  Weight: 120 lb (54.4 kg)   .Body mass index is 25.08 kg/m.  GENERAL:alert, in no acute distress and comfortable SKIN: no acute rashes, no significant lesions EYES: conjunctiva are pink and non-injected, sclera anicteric OROPHARYNX: MMM, no exudates, no oropharyngeal erythema or ulceration NECK: supple, no JVD LYMPH:  no palpable lymphadenopathy in the cervical, axillary or inguinal regions LUNGS: clear to auscultation b/l with normal respiratory effort HEART: regular rate & rhythm ABDOMEN:  normoactive bowel sounds , non tender, not distended. Extremity: no pedal edema PSYCH: alert & oriented x 3 with fluent speech NEURO: no focal motor/sensory deficits  LABORATORY DATA:  I have reviewed the data as listed  . CBC Latest Ref Rng & Units 02/05/2018 08/30/2017 08/31/2016  WBC 3.8 - 10.8 Thousand/uL 9.2 8.6 9.2  Hemoglobin  11.7 - 15.5 g/dL 13.1 12.8 12.2  Hematocrit 35.0 - 45.0 % 40.5 38.3 38.7  Platelets 140 - 400 Thousand/uL 117(L) 134(L) 132(L)    . CMP Latest Ref Rng & Units 02/05/2018  08/30/2017 03/27/2017  Glucose 65 - 139 mg/dL 88 77 71  BUN 7 - 25 mg/dL 30(H) 27(H) 28(H)  Creatinine 0.60 - 0.88 mg/dL 0.96(H) 0.99(H) 0.81  Sodium 135 - 146 mmol/L 142 141 141  Potassium 3.5 - 5.3 mmol/L 4.5 5.1 4.2  Chloride 98 - 110 mmol/L 106 104 106  CO2 20 - 32 mmol/L 30 30 31   Calcium 8.6 - 10.4 mg/dL 10.2 10.6(H) 10.2  Total Protein 6.1 - 8.1 g/dL 6.7 - 6.5  Total Bilirubin 0.2 - 1.2 mg/dL 0.3 - 0.2  Alkaline Phos 33 - 130 U/L - - -  AST 10 - 35 U/L 16 - 24  ALT 6 - 29 U/L 22 17 32(H)     RADIOGRAPHIC STUDIES: I have personally reviewed the radiological images as listed and agreed with the findings in the report.  ASSESSMENT & PLAN:   85 y.o. female with  1. Easily bruising PLAN -Discussed patient's most recent labs from 02/05/18, WBC normal at 9.2k, HGB normal at 13.1, PLT slightly low at 117k -Discussed that her mild thrombocytopenia is not overtly concerning -Discussed that Zoloft, Fish Oil, Vitamin E oil, and her age/skin thinning and senile purpura are probably all likely contributing to her ease of bruising in her upper extremities  -If bruising remains mild, would not recommend changing medications. PCP could adjust these medications if felt clinically necessary.  -If bruising becomes much worse, would recommend further work up -Recommend keeping skin well moisturized after showers, and pat-drying with a towel after showering  -Pt could choose to use forearm compression sleeves when active -Advised that pt continue to do her best to eat well and stay well hydrated -Pt prefers not to have additional labs drawn today, which is not unreasonable given low bleeding risk score and the absence of significant bleeding history (no GI/CNS, joint or muscle bleeds. NO excessive operative bleeding. No FHx.) -Will see the pt back as needed  2)  Patient Active Problem List   Diagnosis Date Noted  . Senile purpura ([place]) 04/14/2018  . History of back surgery 09/01/2017  .  Constipation 09/01/2017  . Hypothyroidism due to acquired atrophy of thyroid 09/01/2017  . Mixed hyperlipidemia 09/01/2017  . Age-related osteoporosis without current pathological fracture 09/01/2017  . High risk medication use 09/01/2017  . Atherosclerosis of native arteries of extremity with intermittent claudication ([place]) 05/15/2017  . Gait abnormality 07/16/2016  . Chronic low back pain 07/16/2016  . Mild cognitive impairment 07/16/2016  . Wrist pain 05/10/2015  . S/P left TKA 11/29/2014  . S/P knee replacement 11/29/2014  . Spontaneous bruising 08/27/2014  . Numbness and tingling in right hand 07/28/2014  . Essential tremor 07/28/2014  . Dizziness 05/28/2014  . Shaky 05/28/2014  . Memory loss 05/28/2014  . Depression 05/06/2014  . Lipoma of left upper thigh 3x5 cm 09/28/2011  . Other primary cardiomyopathies 08/09/2011  . Syncope 08/09/2011  . History of breast cancer [id number], s/p BCT 2008, triple negative 01/26/2011  . Chronic L breast pain with chronic recurrent seroma, s/p excisional biopsy 01/12/2010 01/26/2011  . Fainted   . ICD (implantable cardioverter-defibrillator), biventricular, in situ 08/02/2010  . Chronic systolic heart failure ([place]) 08/02/2010  . Benign hypertension 08/02/2010   -continue f/u with PCP for mx of other chronic medical  issues.  RTC with Dr Irene Limbo as needed   All of the patients questions were answered with apparent satisfaction. The patient knows to call the clinic with any problems, questions or concerns.  The total time spent in the appt was 35 minutes and more than 50% was on counseling and direct patient cares.    Sullivan Lone MD MS AAHIVMS [facility] Hematology/Oncology Physician [facility]  (Office):       [phone] (Work cell):  [phone] (Fax):           [phone]  04/23/2018 1:46 PM  I, Baldwin [place], am acting as a scribe for Dr. Sullivan Lone.   .I have reviewed the above documentation for accuracy and  completeness, and I agree with the above.  Brunetta Genera MD

## 2018-04-25 ENCOUNTER — Telehealth: Payer: Self-pay | Admitting: Hematology

## 2018-04-25 NOTE — Telephone Encounter (Signed)
Per 01/15 los RTC with Dr Irene Limbo as needed.

## 2018-04-28 ENCOUNTER — Ambulatory Visit: Payer: Medicare Other

## 2018-04-28 DIAGNOSIS — M810 Age-related osteoporosis without current pathological fracture: Secondary | ICD-10-CM

## 2018-04-28 MED ORDER — DENOSUMAB 60 MG/ML ~~LOC~~ SOSY
60.0000 mg | PREFILLED_SYRINGE | Freq: Once | SUBCUTANEOUS | Status: AC
  Administered 2018-04-28: 60 mg via SUBCUTANEOUS

## 2018-04-28 NOTE — Progress Notes (Signed)
Patient received prolia injection in left arm subcutaneous.Patient tolerated well as was told to follow up in 6 months and 1 day.Patient verbalized understanding

## 2018-04-30 ENCOUNTER — Other Ambulatory Visit: Payer: Self-pay | Admitting: *Deleted

## 2018-04-30 MED ORDER — POTASSIUM CHLORIDE CRYS ER 10 MEQ PO TBCR: 10.0000 meq | EXTENDED_RELEASE_TABLET | Freq: Every day | ORAL | 0 refills | Status: DC

## 2018-04-30 NOTE — Telephone Encounter (Signed)
[facility]  Pended Rx and sent to [facility] for approval due to [place].

## 2018-05-02 ENCOUNTER — Encounter: Payer: Self-pay | Admitting: Internal Medicine

## 2018-05-07 ENCOUNTER — Ambulatory Visit (INDEPENDENT_AMBULATORY_CARE_PROVIDER_SITE_OTHER): Payer: Medicare Other

## 2018-05-07 DIAGNOSIS — I428 Other cardiomyopathies: Secondary | ICD-10-CM | POA: Diagnosis not present

## 2018-05-07 DIAGNOSIS — I5022 Chronic systolic (congestive) heart failure: Secondary | ICD-10-CM

## 2018-05-08 ENCOUNTER — Telehealth: Payer: Self-pay | Admitting: *Deleted

## 2018-05-08 ENCOUNTER — Encounter: Payer: Self-pay | Admitting: *Deleted

## 2018-05-08 NOTE — Progress Notes (Signed)
Remote ICD transmission.   

## 2018-05-08 NOTE — Telephone Encounter (Signed)
error 

## 2018-05-08 NOTE — Telephone Encounter (Signed)
What kind of cream? This is not specified in the medication list

## 2018-05-08 NOTE — Telephone Encounter (Signed)
Patient called and stated that she wants a refill on the Cream she puts on her knees for pain. I do not see where we have prescribed or refilled this. Is it ok to refill. Please Advise.  She request it to be sent to [place].

## 2018-05-08 NOTE — Telephone Encounter (Signed)
LMOM for patient to return my call regarding name of cream.

## 2018-05-09 LAB — CUP PACEART REMOTE DEVICE CHECK
Battery Remaining Longevity: 66 mo
Battery Remaining Percentage: 100 %
Brady Statistic RA Percent Paced: 0 %
Brady Statistic RV Percent Paced: 100 %
Date Time Interrogation Session: 20200129113400
HighPow Impedance: 49 Ohm
Implantable Lead Implant Date: 20080116
Implantable Lead Implant Date: 20080116
Implantable Lead Implant Date: 20080116
Implantable Lead Location: 753859
Implantable Lead Location: 753860
Implantable Lead Location: 753860
Implantable Lead Model: 157
Implantable Lead Model: 4469
Implantable Lead Model: 4555
Implantable Lead Serial Number: 136532
Implantable Lead Serial Number: 161542
Implantable Lead Serial Number: 473495
Implantable Pulse Generator Implant Date: 20140911
Lead Channel Impedance Value: 424 Ohm
Lead Channel Impedance Value: 557 Ohm
Lead Channel Impedance Value: 833 Ohm
Lead Channel Pacing Threshold Amplitude: 0.6 V
Lead Channel Pacing Threshold Amplitude: 0.6 V
Lead Channel Pacing Threshold Amplitude: 0.9 V
Lead Channel Pacing Threshold Pulse Width: 0.4 ms
Lead Channel Pacing Threshold Pulse Width: 0.4 ms
Lead Channel Pacing Threshold Pulse Width: 0.8 ms
Lead Channel Setting Pacing Amplitude: 2 V
Lead Channel Setting Pacing Amplitude: 2 V
Lead Channel Setting Pacing Amplitude: 2.4 V
Lead Channel Setting Pacing Pulse Width: 0.4 ms
Lead Channel Setting Pacing Pulse Width: 0.8 ms
Lead Channel Setting Sensing Sensitivity: 0.6 mV
Lead Channel Setting Sensing Sensitivity: 1 mV
Pulse Gen Serial Number: 111765

## 2018-05-09 NOTE — Telephone Encounter (Signed)
Patient called and stated that the cream was originally prescribed by Dr. Leona Carry. Stated that there is no name on the cream, it just states Apply 1-2 grams to affected area, Compounded Cream 1 pump=1gram. Rx #[id number]. Please Advise.

## 2018-05-12 NOTE — Telephone Encounter (Signed)
I am going to defer this to Dr Lorre Nick since he originally prescribed and she does not know the name of the medication.

## 2018-05-12 NOTE — Telephone Encounter (Signed)
Patient notified and agreed.  

## 2018-05-23 ENCOUNTER — Encounter: Payer: Self-pay | Admitting: Internal Medicine

## 2018-05-23 ENCOUNTER — Ambulatory Visit (INDEPENDENT_AMBULATORY_CARE_PROVIDER_SITE_OTHER): Payer: Medicare Other | Admitting: Internal Medicine

## 2018-05-23 VITALS — BP 120/60 | HR 64 | Ht <= 58 in | Wt 120.0 lb

## 2018-05-23 DIAGNOSIS — I10 Essential (primary) hypertension: Secondary | ICD-10-CM

## 2018-05-23 DIAGNOSIS — I5022 Chronic systolic (congestive) heart failure: Secondary | ICD-10-CM | POA: Diagnosis not present

## 2018-05-23 DIAGNOSIS — Z95810 Presence of automatic (implantable) cardiac defibrillator: Secondary | ICD-10-CM

## 2018-05-23 DIAGNOSIS — I447 Left bundle-branch block, unspecified: Secondary | ICD-10-CM

## 2018-05-23 MED ORDER — CARVEDILOL 3.125 MG PO TABS: 3.1250 mg | ORAL_TABLET | Freq: Two times a day (BID) | ORAL | 3 refills | Status: DC

## 2018-05-23 NOTE — Patient Instructions (Addendum)
Medication Instructions:  Your physician has recommended you make the following change in your medication:   1.  Stop taking metoprolol  2.  Start taking carvedilol 3.125 mg --Take one tablet by mouth twice a day  Labwork: None ordered.  Testing/Procedures: None ordered.  Follow-Up: Your physician wants you to follow-up in: one year with Dr. Lovena Le.   You will receive a reminder letter in the mail two months in advance. If you don't receive a letter, please call our office to schedule the follow-up appointment.  Remote monitoring is used to monitor your ICD from home. This monitoring reduces the number of office visits required to check your device to one time per year. It allows [place] to keep an eye on the functioning of your device to ensure it is working properly. You are scheduled for a device check from home on 08/06/2018. You may send your transmission at any time that day. If you have a wireless device, the transmission will be sent automatically. After your physician reviews your transmission, you will receive a postcard with your next transmission date.  Any Other Special Instructions Will Be Listed Below (If Applicable).  If you need a refill on your cardiac medications before your next appointment, please call your pharmacy.    Carvedilol tablets What is this medicine? CARVEDILOL (KAR ve dil ol) is a beta-blocker. Beta-blockers reduce the workload on the heart and help it to beat more regularly. This medicine is used to treat high blood pressure and heart failure. This medicine may be used for other purposes; ask your health care provider or pharmacist if you have questions. COMMON BRAND NAME(S): Coreg What should I tell my health care provider before I take this medicine? They need to know if you have any of these conditions: -circulation problems -diabetes -history of heart attack or heart disease -liver disease -lung or breathing disease, like asthma or  emphysema -pheochromocytoma -slow or irregular heartbeat -thyroid disease -an unusual or allergic reaction to carvedilol, other beta-blockers, medicines, foods, dyes, or preservatives -pregnant or trying to get pregnant -breast-feeding How should I use this medicine? Take this medicine by mouth with a glass of water. Follow the directions on the prescription label. It is best to take the tablets with food. Take your doses at regular intervals. Do not take your medicine more often than directed. Do not stop taking except on the advice of your doctor or health care professional. Talk to your pediatrician regarding the use of this medicine in children. Special care may be needed. Overdosage: If you think you have taken too much of this medicine contact a poison control center or emergency room at once. NOTE: This medicine is only for you. Do not share this medicine with others. What if I miss a dose? If you miss a dose, take it as soon as you can. If it is almost time for your next dose, take only that dose. Do not take double or extra doses. What may interact with this medicine? This medicine may interact with the following medications: -certain medicines for blood pressure, heart disease, irregular heart beat -certain medicines for depression, like fluoxetine or paroxetine -certain medicines for diabetes, like glipizide or glyburide -cimetidine -clonidine -cyclosporine -digoxin -MAOIs like Carbex, Eldepryl, Marplan, Nardil, and Parnate -reserpine -rifampin This list may not describe all possible interactions. Give your health care provider a list of all the medicines, herbs, non-prescription drugs, or dietary supplements you use. Also tell them if you smoke, drink alcohol, or use illegal  drugs. Some items may interact with your medicine. What should I watch for while using this medicine? Check your heart rate and blood pressure regularly while you are taking this medicine. Ask your doctor  or health care professional what your heart rate and blood pressure should be, and when you should contact him or her. Do not stop taking this medicine suddenly. This could lead to serious heart-related effects. Contact your doctor or health care professional if you have difficulty breathing while taking this drug. Check your weight daily. Ask your doctor or health care professional when you should notify him/her of any weight gain. You may get drowsy or dizzy. Do not drive, use machinery, or do anything that requires mental alertness until you know how this medicine affects you. To reduce the risk of dizzy or fainting spells, do not sit or stand up quickly. Alcohol can make you more drowsy, and increase flushing and rapid heartbeats. Avoid alcoholic drinks. If you have diabetes, check your blood sugar as directed. Tell your doctor if you have changes in your blood sugar while you are taking this medicine. If you are going to have surgery, tell your doctor or health care professional that you are taking this medicine. What side effects may I notice from receiving this medicine? Side effects that you should report to your doctor or health care professional as soon as possible: -allergic reactions like skin rash, itching or hives, swelling of the face, lips, or tongue -breathing problems -dark urine -irregular heartbeat -swollen legs or ankles -vomiting -yellowing of the eyes or skin Side effects that usually do not require medical attention (report to your doctor or health care professional if they continue or are bothersome): -change in sex drive or performance -diarrhea -dry eyes (especially if wearing contact lenses) -dry, itching skin -headache -nausea -unusually tired This list may not describe all possible side effects. Call your doctor for medical advice about side effects. You may report side effects to FDA at 1-800-FDA-1088. Where should I keep my medicine? Keep out of the reach of  children. Store at room temperature below 30 degrees C (86 degrees F). Protect from moisture. Keep container tightly closed. Throw away any unused medicine after the expiration date. NOTE: This sheet is a summary. It may not cover all possible information. If you have questions about this medicine, talk to your doctor, pharmacist, or health care provider.  2019 Elsevier/Gold Standard (2012-11-30 14:12:02)

## 2018-05-23 NOTE — Progress Notes (Signed)
HPI Ms. Hundal returns today for followup. She is a pleasant 85 yo woman with a h/o chronic systolic heart failure, LBBB, s/p biv ICD insertion. She notes some weight loss and dyspnea when she goes up an incline. She does ok on flat ground. No ICD shocks.  Allergies  Allergen Reactions  . Iodine Shortness Of Breath    Iodine contrast, CHF , SOB  . Shellfish Allergy Shortness Of Breath  . Memantine     Malaise, fogginess/ couldn't think  . Aspirin Nausea And Vomiting  . Codeine Nausea And Vomiting     Current Outpatient Medications  Medication Sig Dispense Refill  . acetaminophen (TYLENOL) 325 MG tablet Take 2 tablets (650 mg total) by mouth every 6 (six) hours as needed for mild pain (or Fever >/= 101).    Marland Kitchen buPROPion (WELLBUTRIN XL) 150 MG 24 hr tablet Take 3 tablets (450 mg total) by mouth daily. 270 tablet 3  . cholecalciferol (VITAMIN D) 1000 UNITS tablet Take 1,000 Units by mouth daily.    . furosemide (LASIX) 40 MG tablet Take 20 mg by mouth daily.    Marland Kitchen levothyroxine (SYNTHROID, LEVOTHROID) 100 MCG tablet Take 1 tablet (100 mcg total) by mouth daily before breakfast. 90 tablet 3  . Magnesium 250 MG TABS Take 250 mg by mouth daily.    . NONFORMULARY OR COMPOUNDED ITEM APPLY 1-2 GMS TO AFFECTED AREA 3-4 TIMES DAILY AS NEEDED FOR PAIN. THIS IS A COMPOUNDED CREAM.1 PUMP EQUALS 1 GRAM.  2  . Omega-3 Fatty Acids (FISH OIL) 1200 MG CAPS Take 1,200 mg by mouth every morning.    Marland Kitchen omeprazole (PRILOSEC) 40 MG capsule TAKE 1 CAPSULE BY MOUTH  DAILY FOR STOMACH 90 capsule 1  . Oxcarbazepine (TRILEPTAL) 300 MG tablet Take 300 mg by mouth every morning.     . potassium chloride (K-DUR,KLOR-CON) 10 MEQ tablet Take 1 tablet (10 mEq total) by mouth daily. 90 tablet 0  . sertraline (ZOLOFT) 100 MG tablet Take 200 mg by mouth daily.     Marland Kitchen spironolactone (ALDACTONE) 25 MG tablet TAKE 1 TABLET BY MOUTH  EVERY MORNING 90 tablet 1  . vitamin E 1000 UNIT capsule Take 1,000 Units by mouth daily.     . carvedilol (COREG) 3.125 MG tablet Take 1 tablet (3.125 mg total) by mouth 2 (two) times daily. 180 tablet 3   No current facility-administered medications for this visit.      Past Medical History:  Diagnosis Date  . Arthritis   . Cancer [facility])    left breast cancer   . CHF (congestive heart failure) ([place])    PACEMAKER & DEFIB  . Complication of anesthesia    hypotensive after back surgery in 2006- reports on chart   . Depression   . Dyslipidemia   . Fainted 04/21/06   AT [place]  . GERD (gastroesophageal reflux disease)   . Headache(784.0)   . Hearing loss   . HLD (hyperlipidemia)   . Hypertension   . Hypothyroidism   . ICD (implantable cardiac defibrillator) in place    pt has pacer/icd  . ICD (implantable cardiac defibrillator), biventricular, in situ   . LBBB (left bundle branch block)   . Memory loss   . Nonischemic cardiomyopathy ([place])   . Normal coronary arteries    s/p cardiac cath 2007  . Pacemaker    Guidant Device  . Syncope   . Systolic CHF ([place])   . Vertigo   . Wears  glasses     ROS:   All systems reviewed and negative except as noted in the HPI.   Past Surgical History:  Procedure Laterality Date  . BACK SURGERY     lumbar fusion   . BREAST SURGERY  2000   LUMP REMOVAL. STAGE 1 CANCER  . CARDIAC CATHETERIZATION    . CATARACT EXTRACTION    . EYE SURGERY    . IMPLANTABLE CARDIOVERTER DEFIBRILLATOR GENERATOR CHANGE N/A 12/18/2012   Procedure: IMPLANTABLE CARDIOVERTER DEFIBRILLATOR GENERATOR CHANGE;  Surgeon: [name], MD;  Location: [facility] CATH LAB;  Service: Cardiovascular;  Laterality: N/A;  . JOINT REPLACEMENT  06/14/01   right  . LUMBAR FUSION  2006  . MASS EXCISION  11/08/2011   Procedure: EXCISION MASS;  Surgeon: [name], MD;  Location: [facility] ORS;  Service: General;  Laterality: Left;  Excision Left Thigh Mass  . MASTECTOMY, PARTIAL  2008   GOT PACEMAKER AND DEFIB AT THAT TIME  . PACEMAKER INSERTION  04/23/06  . TOTAL KNEE ARTHROPLASTY   05/17/01   RIGHT KNEE  . TOTAL KNEE ARTHROPLASTY Left 11/29/2014   Procedure: TOTAL LEFT KNEE ARTHROPLASTY;  Surgeon: Paralee Cancel, MD;  Location: [facility] ORS;  Service: Orthopedics;  Laterality: Left;     Family History  Problem Relation Age of Onset  . Hypertension Mother   . Arthritis Mother   . Hypertension Father   . Hypertension Brother   . Hypertension Brother      Social History   Socioeconomic History  . Marital status: Married    Spouse name: Not on file  . Number of children: 1  . Years of education: Masters  . Highest education level: Not on file  Occupational History  . Occupation: Retired  Scientific laboratory technician  . Financial resource strain: Not hard at all  . Food insecurity:    Worry: Never true    Inability: Never true  . Transportation needs:    Medical: No    Non-medical: No  Tobacco Use  . Smoking status: Never Smoker  . Smokeless tobacco: Never Used  Substance and Sexual Activity  . Alcohol use: No  . Drug use: No  . Sexual activity: Not Currently    Birth control/protection: Post-menopausal  Lifestyle  . Physical activity:    Days per week: 4 days    Minutes per session: 30 min  . Stress: To some extent  Relationships  . Social connections:    Talks on phone: More than three times a week    Gets together: Once a week    Attends religious service: More than 4 times per year    Active member of club or organization: Yes    Attends meetings of clubs or organizations: Never    Relationship status: Married  . Intimate partner violence:    Fear of current or ex partner: No    Emotionally abused: No    Physically abused: No    Forced sexual activity: No  Other Topics Concern  . Not on file  Social History Narrative   Lives at home with husband.   Right-handed.      As of 07/28/2014   Diet: No special diet   Caffeine: yes, Chocolate, tea and sodas    Married: YES, 1970   House: Yes, 2 stories, 2-3 persons live in home   Pets: No   Current/Past  profession: Engineer, mining, Nurse Practitioner    Exercise: Yes 2-3 x weekly   Living Will: Yes   DNR:  No   POA/HPOA: No        BP 120/60   Pulse 64   Ht 4\' 10"  (1.473 m)   Wt 120 lb (54.4 kg)   BMI 25.08 kg/m   Physical Exam:  Well appearing NAD HEENT: Unremarkable Neck:  No JVD, no thyromegally Lymphatics:  No adenopathy Back:  No CVA tenderness Lungs:  Clear with no wheezes HEART:  Regular rate rhythm, no murmurs, no rubs, no clicks Abd:  soft, positive bowel sounds, no organomegally, no rebound, no guarding Ext:  2 plus pulses, no edema, no cyanosis, no clubbing Skin:  No rashes no nodules Neuro:  CN II through XII intact, motor grossly intact  EKG - NSR with biv pacing  DEVICE  Normal device function.  See PaceArt for details.   Assess/Plan: 1. Chronic systolic heart failure - her symptoms are class 2B. She will continue her current meds. I asked that the patient avoid salty food.  2. ICD - her [place] ICD is working normally.  3. HTN - her blood pressure is controlled. She will maintain a low sodium diet.  Mikle Bosworth.D.

## 2018-05-27 LAB — CUP PACEART INCLINIC DEVICE CHECK
Date Time Interrogation Session: 20200214050000
HighPow Impedance: 47 Ohm
Implantable Lead Implant Date: 20080116
Implantable Lead Implant Date: 20080116
Implantable Lead Implant Date: 20080116
Implantable Lead Location: 753859
Implantable Lead Location: 753860
Implantable Lead Location: 753860
Implantable Lead Model: 157
Implantable Lead Model: 4469
Implantable Lead Model: 4555
Implantable Lead Serial Number: 136532
Implantable Lead Serial Number: 161542
Implantable Lead Serial Number: 473495
Implantable Pulse Generator Implant Date: 20140911
Lead Channel Impedance Value: 409 Ohm
Lead Channel Impedance Value: 544 Ohm
Lead Channel Impedance Value: 804 Ohm
Lead Channel Pacing Threshold Amplitude: 0.6 V
Lead Channel Pacing Threshold Amplitude: 0.7 V
Lead Channel Pacing Threshold Amplitude: 0.8 V
Lead Channel Pacing Threshold Pulse Width: 0.4 ms
Lead Channel Pacing Threshold Pulse Width: 0.4 ms
Lead Channel Pacing Threshold Pulse Width: 0.8 ms
Lead Channel Sensing Intrinsic Amplitude: 19.8 mV
Lead Channel Sensing Intrinsic Amplitude: 25 mV
Lead Channel Sensing Intrinsic Amplitude: 5.1 mV
Lead Channel Setting Pacing Amplitude: 2 V
Lead Channel Setting Pacing Amplitude: 2 V
Lead Channel Setting Pacing Amplitude: 2.4 V
Lead Channel Setting Pacing Pulse Width: 0.4 ms
Lead Channel Setting Pacing Pulse Width: 0.8 ms
Lead Channel Setting Sensing Sensitivity: 0.6 mV
Lead Channel Setting Sensing Sensitivity: 1 mV
Pulse Gen Serial Number: 111765

## 2018-06-19 ENCOUNTER — Other Ambulatory Visit: Payer: Self-pay

## 2018-06-19 ENCOUNTER — Ambulatory Visit (HOSPITAL_COMMUNITY)
Admission: EM | Admit: 2018-06-19 | Discharge: 2018-06-19 | Disposition: A | Discharge status: Home / Self Care | Payer: Medicare Other | Attending: Family Medicine | Admitting: Family Medicine

## 2018-06-19 ENCOUNTER — Encounter (HOSPITAL_COMMUNITY): Payer: Self-pay | Admitting: Emergency Medicine

## 2018-06-19 ENCOUNTER — Other Ambulatory Visit: Payer: Self-pay | Admitting: Nurse Practitioner

## 2018-06-19 DIAGNOSIS — I10 Essential (primary) hypertension: Secondary | ICD-10-CM

## 2018-06-19 DIAGNOSIS — M5441 Lumbago with sciatica, right side: Secondary | ICD-10-CM

## 2018-06-19 MED ORDER — HYDROCODONE-ACETAMINOPHEN 5-325 MG PO TABS: 1.0000 | ORAL_TABLET | Freq: Four times a day (QID) | ORAL | 0 refills | Status: DC | PRN

## 2018-06-19 MED ORDER — BACLOFEN 5 MG PO TABS: 5.0000 mg | ORAL_TABLET | Freq: Two times a day (BID) | ORAL | 0 refills | Status: DC | PRN

## 2018-06-19 MED ORDER — PREDNISONE 10 MG PO TABS: ORAL_TABLET | ORAL | 0 refills | Status: DC

## 2018-06-19 NOTE — ED Provider Notes (Signed)
[place]    CSN: [id number] Arrival date & time: 06/19/18  1300     History   Chief Complaint Chief Complaint  Patient presents with   Back Pain    HPI [name] is a 85 y.o. female.   HPI [name] is brought in by her husband.  She has chronic low back pain.  She had back surgery many years ago and has back pain on a daily basis.  She states that periodically will flareup.  For the last 4 days the patient has had increased pain in her right flank.  Points to the right low back.  Pain goes down the right lateral thigh to about the mid calf.  She states the leg feels weak when she steps on it, feels like it will give out.  She is afraid of falls.  No accident, no injury, no fall.  This started on midday Sunday.  She has had flareups before. I expressed to her my concern about giving her medication given her age and frailty.  She states she has tolerated steroids, vicodin, before.  The ibuprofen and tylenol not helping.  Took a tramadol without relief No bowel or bladder problem  Past Medical History:  Diagnosis Date   Arthritis    Cancer ([place])    left breast cancer    CHF (congestive heart failure) ([place])    PACEMAKER & DEFIB   Complication of anesthesia    hypotensive after back surgery in 2006- reports on chart    Depression    Dyslipidemia    Fainted 04/21/06   AT [facility]   GERD (gastroesophageal reflux disease)    Headache(784.0)    Hearing loss    HLD (hyperlipidemia)    Hypertension    Hypothyroidism    ICD (implantable cardiac defibrillator) in place    pt has pacer/icd   ICD (implantable cardiac defibrillator), biventricular, in situ    LBBB (left bundle branch block)    Memory loss    Nonischemic cardiomyopathy ([place])    Normal coronary arteries    s/p cardiac cath 2007   Pacemaker    Guidant Device   Syncope    Systolic CHF [facility])    Vertigo    Wears glasses     Patient Active Problem List   Diagnosis Date  Noted   Senile purpura ([place]) 04/14/2018   Lumbar post-laminectomy syndrome 12/03/2017   Lumbar spondylosis 12/03/2017   History of back surgery 09/01/2017   Constipation 09/01/2017   Hypothyroidism due to acquired atrophy of thyroid 09/01/2017   Mixed hyperlipidemia 09/01/2017   Age-related osteoporosis without current pathological fracture 09/01/2017   High risk medication use 09/01/2017   Arthritis of hand 05/17/2017   Atherosclerosis of native arteries of extremity with intermittent claudication ([place]) 05/15/2017   Status post total bilateral knee replacement 12/20/2016   Gait abnormality 07/16/2016   Chronic low back pain 07/16/2016   Mild cognitive impairment 07/16/2016   Wrist pain 05/10/2015   S/P left TKA 11/29/2014   S/P knee replacement 11/29/2014   Spontaneous bruising 08/27/2014   Numbness and tingling in right hand 07/28/2014   Essential tremor 07/28/2014   Dizziness 05/28/2014   Shaky 05/28/2014   Memory loss 05/28/2014   Depression 05/06/2014   Lipoma of left upper thigh 3x5 cm 09/28/2011   Other primary cardiomyopathies 08/09/2011   Syncope 08/09/2011   History of breast cancer [id number], s/p BCT 2008, triple negative 01/26/2011   Chronic L breast pain with chronic  recurrent seroma, s/p excisional biopsy 01/12/2010 01/26/2011   Fainted    ICD (implantable cardioverter-defibrillator), biventricular, in situ 42/59/5638   Chronic systolic heart failure ([place]) 08/02/2010   Benign hypertension 08/02/2010    Past Surgical History:  Procedure Laterality Date   BACK SURGERY     lumbar fusion    BREAST SURGERY  2000   LUMP REMOVAL. STAGE 1 CANCER   CARDIAC CATHETERIZATION     CATARACT EXTRACTION     EYE SURGERY     IMPLANTABLE CARDIOVERTER DEFIBRILLATOR GENERATOR CHANGE N/A 12/18/2012   Procedure: IMPLANTABLE CARDIOVERTER DEFIBRILLATOR GENERATOR CHANGE;  Surgeon: [name], MD;  Location: [facility] CATH LAB;  Service:  Cardiovascular;  Laterality: N/A;   JOINT REPLACEMENT  06/14/01   right   LUMBAR FUSION  2006   MASS EXCISION  11/08/2011   Procedure: EXCISION MASS;  Surgeon: [name], MD;  [facility];  Service: General;  Laterality: Left;  Excision Left Thigh Mass   MASTECTOMY, PARTIAL  2008   GOT PACEMAKER AND DEFIB AT THAT TIME   PACEMAKER INSERTION  04/23/06   TOTAL KNEE ARTHROPLASTY  05/17/01   RIGHT KNEE   TOTAL KNEE ARTHROPLASTY Left 11/29/2014   Procedure: TOTAL LEFT KNEE ARTHROPLASTY;  Surgeon: Paralee Cancel, MD;  [facility];  Service: Orthopedics;  Laterality: Left;    OB History   No obstetric history on file.      Home Medications    Prior to Admission medications   Medication Sig Start Date End Date Taking? Authorizing Provider  buPROPion (WELLBUTRIN XL) 150 MG 24 hr tablet Take 3 tablets (450 mg total) by mouth daily. 05/20/15  Yes Eulas Post, Monica, DO  carvedilol (COREG) 3.125 MG tablet Take 1 tablet (3.125 mg total) by mouth 2 (two) times daily. 05/23/18 08/21/18 Yes [name], MD  cholecalciferol (VITAMIN D) 1000 UNITS tablet Take 1,000 Units by mouth daily.   Yes [provider]  furosemide (LASIX) 40 MG tablet Take 20 mg by mouth daily.   Yes [provider]  levothyroxine (SYNTHROID, LEVOTHROID) 100 MCG tablet Take 1 tablet (100 mcg total) by mouth daily before breakfast. 05/20/15  Yes [name], DO  Magnesium 250 MG TABS Take 250 mg by mouth daily.   Yes [provider]  Omega-3 Fatty Acids (FISH OIL) 1200 MG CAPS Take 1,200 mg by mouth every morning.   Yes [provider]  omeprazole (PRILOSEC) 40 MG capsule TAKE 1 CAPSULE BY MOUTH  DAILY FOR STOMACH 12/30/17  Yes [name], DO  Oxcarbazepine (TRILEPTAL) 300 MG tablet Take 300 mg by mouth every morning.  01/12/13  Yes [provider]  potassium chloride (K-DUR,KLOR-CON) 10 MEQ tablet Take 1 tablet (10 mEq total) by mouth daily. 04/30/18  Yes [name],  NP  sertraline (ZOLOFT) 100 MG tablet Take 200 mg by mouth daily.  02/27/18  Yes [provider]  spironolactone (ALDACTONE) 25 MG tablet TAKE 1 TABLET BY MOUTH  EVERY MORNING 01/13/18  Yes [name], DO  vitamin E 1000 UNIT capsule Take 1,000 Units by mouth daily.   Yes [provider]  acetaminophen (TYLENOL) 325 MG tablet Take 2 tablets (650 mg total) by mouth every 6 (six) hours as needed for mild pain (or Fever >/= 101). 12/01/14   Danae Orleans, PA-C  Baclofen 5 MG TABS Take 5 mg by mouth 2 (two) times daily as needed. 06/19/18   Raylene Everts, MD  HYDROcodone-acetaminophen (NORCO/VICODIN) 5-325 MG tablet Take 1 tablet  by mouth every 6 (six) hours as needed. 06/19/18   Raylene Everts, MD  NONFORMULARY OR COMPOUNDED ITEM APPLY 1-2 GMS TO AFFECTED AREA 3-4 TIMES DAILY AS NEEDED FOR PAIN. THIS IS A COMPOUNDED CREAM.1 PUMP EQUALS 1 GRAM. 10/15/17   [provider]  predniSONE (DELTASONE) 10 MG tablet Take 2 tabs BID for 3 days then one tab BID for 3 days then stop 06/19/18   Raylene Everts, MD    Family History Family History  Problem Relation Age of Onset   Hypertension Mother    Arthritis Mother    Hypertension Father    Hypertension Brother    Hypertension Brother     Social History Social History   Tobacco Use   Smoking status: Never Smoker   Smokeless tobacco: Never Used  Substance Use Topics   Alcohol use: No   Drug use: No     Allergies   Iodine; Shellfish allergy; Memantine; Aspirin; and Codeine   Review of Systems Review of Systems  Constitutional: Negative for chills and fever.  HENT: Negative for ear pain and sore throat.   Eyes: Negative for pain and visual disturbance.  Respiratory: Negative for cough and shortness of breath.   Cardiovascular: Negative for chest pain and palpitations.  Gastrointestinal: Negative for abdominal pain and vomiting.  Genitourinary: Negative for dysuria and hematuria.    Musculoskeletal: Positive for back pain and gait problem. Negative for arthralgias.  Skin: Negative for color change and rash.  Neurological: Positive for weakness. Negative for seizures and syncope.  All other systems reviewed and are negative.    Physical Exam Triage Vital Signs ED Triage Vitals [06/19/18 1314]  Enc Vitals Group     BP 132/78     Pulse Rate 63     Resp 18     Temp 97.6 F (36.4 C)     Temp Source Temporal     SpO2 100 %     Weight      Height      Head Circumference      Peak Flow      Pain Score 9     Pain Loc      Pain Edu?      Excl. in [place]?    No data found.  Updated Vital Signs BP 132/78 (BP Location: Right Arm)    Pulse 63    Temp 97.6 F (36.4 C) (Temporal)    Resp 18    SpO2 100%      Physical Exam Constitutional:      General: She is not in acute distress.    Appearance: She is well-developed and normal weight.     Comments: In wheelchair  HENT:     Head: Normocephalic and atraumatic.  Eyes:     Conjunctiva/sclera: Conjunctivae normal.     Pupils: Pupils are equal, round, and reactive to light.  Neck:     Musculoskeletal: Normal range of motion.  Cardiovascular:     Rate and Rhythm: Normal rate and regular rhythm.     Pulses: Normal pulses.  Pulmonary:     Effort: Pulmonary effort is normal. No respiratory distress.     Breath sounds: Normal breath sounds.  Abdominal:     General: There is no distension.     Palpations: Abdomen is soft.  Musculoskeletal: Normal range of motion.     Comments: tenderness right column of muscles.  Reflexes absent bilat at knee.  SLR on R causes increased pain  Skin:  General: Skin is warm and dry.  Neurological:     Mental Status: She is alert.      UC Treatments / Results  Labs (all labs ordered are listed, but only abnormal results are displayed) Labs Reviewed - No data to display  EKG None  Radiology No results found.  Procedures Procedures (including critical care  time)  Medications Ordered in UC Medications - No data to display  Initial Impression / Assessment and Plan / UC Course  I have reviewed the triage vital signs and the nursing notes.  Pertinent labs & imaging results that were available during my care of the patient were reviewed by me and considered in my medical decision making (see chart for details).     Discussed sciatica with husband and patient.  Will treat with steroids and limited pain and muscle relaxing medicines.  CAUTIONED sedation and falls.. Final Clinical Impressions(s) / UC Diagnoses   Final diagnoses:  Acute back pain with sciatica, right     Discharge Instructions     Activity as tolerated.  Please use your walker as long as your leg feels unstable or weak Take the prednisone as directed. Take baclofen twice a day as needed as muscle relaxer.  If it is too strong, cut in half Take hydrocodone as needed for severe pain.  Again, if it is too strong cut in half Take all medicine with a bit of food See your doctor if not better in a few days Return to ER if you get worse instead of better   ED Prescriptions    Medication Sig Dispense Auth. Provider   Baclofen 5 MG TABS Take 5 mg by mouth 2 (two) times daily as needed. 10 tablet Raylene Everts, MD   predniSONE (DELTASONE) 10 MG tablet Take 2 tabs BID for 3 days then one tab BID for 3 days then stop 18 tablet Raylene Everts, MD   HYDROcodone-acetaminophen (NORCO/VICODIN) 5-325 MG tablet Take 1 tablet by mouth every 6 (six) hours as needed. 10 tablet Raylene Everts, MD     Controlled Substance Prescriptions [place] Controlled Substance Registry consulted? Yes, I have consulted the [place] Controlled Substances Registry for this patient, and feel the risk/benefit ratio today is favorable for proceeding with this prescription for a controlled substance.   Raylene Everts, MD 06/19/18 1425

## 2018-06-19 NOTE — ED Triage Notes (Signed)
Pt presents to [facility] for assessment of right back, buttocks and leg pain x 4 days.  Denies known injury.

## 2018-06-19 NOTE — ED Notes (Signed)
Patient able to ambulate independently, but needed support during spasms.  Pt verbalized understanding of importance of using her walker during this episode of pain and spasms for her safety

## 2018-06-19 NOTE — Discharge Instructions (Signed)
Activity as tolerated.  Please use your walker as long as your leg feels unstable or weak Take the prednisone as directed. Take baclofen twice a day as needed as muscle relaxer.  If it is too strong, cut in half Take hydrocodone as needed for severe pain.  Again, if it is too strong cut in half Take all medicine with a bit of food See your doctor if not better in a few days Return to ER if you get worse instead of better

## 2018-06-20 ENCOUNTER — Other Ambulatory Visit: Payer: Self-pay | Admitting: *Deleted

## 2018-06-20 MED ORDER — SPIRONOLACTONE 25 MG PO TABS: 25.0000 mg | ORAL_TABLET | Freq: Every morning | ORAL | 1 refills | Status: DC

## 2018-06-20 NOTE — Telephone Encounter (Signed)
Left message on voicemail for patient to return call when available    Reason for call: schedule non fasting lab appointment  Future order placed

## 2018-06-20 NOTE — Telephone Encounter (Signed)
She should not be due for potassium, looks like it was just filled last month with #30, she also needs updated labs, lets see if she will come in for lab to follow up (BMP, dx: htn)

## 2018-06-20 NOTE — Addendum Note (Signed)
Addended by: [name] on: 06/20/2018 09:48 AM   Modules accepted: Orders

## 2018-06-20 NOTE — Telephone Encounter (Signed)
Very High Risk warning populated when attempting to refill medication  Sent to provider to review and refill if necessary

## 2018-06-26 ENCOUNTER — Other Ambulatory Visit: Payer: Self-pay | Admitting: Nurse Practitioner

## 2018-06-26 MED ORDER — FUROSEMIDE 20 MG PO TABS: 20.0000 mg | ORAL_TABLET | Freq: Every day | ORAL | 2 refills | Status: DC

## 2018-07-04 ENCOUNTER — Telehealth: Payer: Self-pay | Admitting: *Deleted

## 2018-07-04 NOTE — Telephone Encounter (Signed)
Patient called and left message on Clinical Intake stating that she was returning Chrae's call.  Im not sure of the call. Tried calling patient back but rings busy.

## 2018-07-07 NOTE — Telephone Encounter (Signed)
Left message on voicemail for patient to return call when available , reason for call: Patient needs to schedule a fasting lab appointment  Refer to refill encounter from 06/19/2018

## 2018-07-08 ENCOUNTER — Other Ambulatory Visit: Payer: Medicare Other

## 2018-07-11 ENCOUNTER — Other Ambulatory Visit: Payer: Self-pay

## 2018-07-11 ENCOUNTER — Other Ambulatory Visit: Payer: Medicare Other

## 2018-07-11 DIAGNOSIS — I10 Essential (primary) hypertension: Secondary | ICD-10-CM

## 2018-07-11 LAB — BASIC METABOLIC PANEL WITH GFR
BUN/Creatinine Ratio: 25 (calc) — ABNORMAL HIGH (ref 6–22)
BUN: 23 mg/dL (ref 7–25)
CO2: 30 mmol/L (ref 20–32)
Calcium: 9.8 mg/dL (ref 8.6–10.4)
Chloride: 104 mmol/L (ref 98–110)
Creat: 0.92 mg/dL — ABNORMAL HIGH (ref 0.60–0.88)
GFR, Est African American: 67 mL/min/{1.73_m2} (ref >=60)
GFR, Est Non African American: 58 mL/min/{1.73_m2} — ABNORMAL LOW (ref >=60)
Glucose, Bld: 93 mg/dL (ref 65–99)
Potassium: 4.5 mmol/L (ref 3.5–5.3)
Sodium: 141 mmol/L (ref 135–146)

## 2018-07-14 ENCOUNTER — Other Ambulatory Visit: Payer: Self-pay

## 2018-07-14 ENCOUNTER — Encounter: Payer: Self-pay | Admitting: Nurse Practitioner

## 2018-07-14 ENCOUNTER — Ambulatory Visit (INDEPENDENT_AMBULATORY_CARE_PROVIDER_SITE_OTHER): Payer: Medicare Other | Admitting: Nurse Practitioner

## 2018-07-14 DIAGNOSIS — R634 Abnormal weight loss: Secondary | ICD-10-CM

## 2018-07-14 DIAGNOSIS — M810 Age-related osteoporosis without current pathological fracture: Secondary | ICD-10-CM

## 2018-07-14 DIAGNOSIS — F3341 Major depressive disorder, recurrent, in partial remission: Secondary | ICD-10-CM

## 2018-07-14 DIAGNOSIS — E782 Mixed hyperlipidemia: Secondary | ICD-10-CM

## 2018-07-14 DIAGNOSIS — E034 Atrophy of thyroid (acquired): Secondary | ICD-10-CM | POA: Diagnosis not present

## 2018-07-14 DIAGNOSIS — M5441 Lumbago with sciatica, right side: Secondary | ICD-10-CM

## 2018-07-14 DIAGNOSIS — G3184 Mild cognitive impairment, so stated: Secondary | ICD-10-CM | POA: Diagnosis not present

## 2018-07-14 DIAGNOSIS — I70213 Atherosclerosis of native arteries of extremities with intermittent claudication, bilateral legs: Secondary | ICD-10-CM

## 2018-07-14 DIAGNOSIS — R233 Spontaneous ecchymoses: Secondary | ICD-10-CM

## 2018-07-14 DIAGNOSIS — G8929 Other chronic pain: Secondary | ICD-10-CM

## 2018-07-14 DIAGNOSIS — I10 Essential (primary) hypertension: Secondary | ICD-10-CM

## 2018-07-14 DIAGNOSIS — I5022 Chronic systolic (congestive) heart failure: Secondary | ICD-10-CM

## 2018-07-14 DIAGNOSIS — K5900 Constipation, unspecified: Secondary | ICD-10-CM

## 2018-07-14 DIAGNOSIS — R238 Other skin changes: Secondary | ICD-10-CM

## 2018-07-14 NOTE — Progress Notes (Signed)
This service is provided via telemedicine  No vital signs collected/recorded due to the encounter was a telemedicine visit.   Location of patient (ex: home, work): Home  Patient consents to a telephone visit:  Yes  Location of the provider (ex: office, home): [employer], Office   Names of all persons participating in the telemedicine service and their role in the encounter:    Time spent on call:  12 min with medical assistant  Virtual Visit via Telephone Note  I connected with [name] on 07/14/18 at 10:00 AM EDT by telephone and verified that I am speaking with the correct person using two identifiers.   I discussed the limitations, risks, security and privacy concerns of performing an evaluation and management service by telephone and the availability of in person appointments. I also discussed with the patient that there may be a patient responsible charge related to this service. The patient expressed understanding and agreed to proceed.     Careteam: Patient Care Team: [name], NP as PCP - General (Geriatric Medicine) [name], MD as Consulting Physician (General Surgery) Paralee Cancel, MD as Consulting Physician (Orthopedic Surgery) Marica Otter, [employer] (Optometry) Marcial Pacas, MD as Consulting Physician (Neurology) [name], MD as Consulting Physician (Cardiology) Ricard Dillon, MD (Psychiatry)  Advanced Directive information Does Patient Have a Medical Advance Directive?: Yes, Type of Advance Directive: [place];Living will  Allergies  Allergen Reactions  . Iodine Shortness Of Breath    Iodine contrast, CHF , SOB  . Shellfish Allergy Shortness Of Breath  . Memantine     Malaise, fogginess/ couldn't think  . Aspirin Nausea And Vomiting  . Codeine Nausea And Vomiting    Chief Complaint  Patient presents with  . Medical Management of Chronic Issues    3 month follow-up and discuss labs (copy  printed and mailed with AVS) Tele-Visit   . Medication Management    Discuss compound cream      HPI: Patient is a 85 y.o. female for routine follow up via tele-visit  Chronic back pain - she has lumbar spondylosis and is s/p back sx; followed by Ortho Dr Nelva Bush (has not seen in about 4 months). She receives steroid injections periodically. Questions if she should go to pain management but then states she will follow up with Dr Nelva Bush.  Currently taking tylenol 500 mg 2 tablets daily. When she takes this does not really provide much improvement to her back. 9-10/10. No numbness or tingling, but sometimes it does. Has completed PT twice.   GERD- stable on omeprazole, no symptoms currently.   PAD -vascular US that was neg for DVT. ABI left 0.55(prev 0.56);right 1.06(prev 1.03) in June 2019. She was told by vascular sx that she had varicose veins  Dementia/MCI- stable. She reports 5mg  aricept caused next day sedation and she stopped med. Followed by neuro Dr Krista Blue every 6 months. MMSE 27/30. She has failed namenda, aricept and exelon patch 2/2 ADRs.  Depression - mood overall stable "up and down"  She is followed by psychiatrist;  Dr Reece Levy for psych. Currently taking sertraline, trileptal and wellbutrin xl. She has never had counseling. No SI or HI.   Chronic systolic HF/AICD&pacer/LBBB/NICM - followed by cardiology Dr Lovena Le. She takes lasix with K supplement, lopressor, benicar, aldactone. EF 45-50% in 2015 (prev 20% in 2008). Pacer which is checked per cardiologist. went to cardiologist in feb 2020.   Hyperlipidemia - diet controlled. She also takes flax  seed oil. LDL 100 in October 2019  Hypothyroidism - stable on  100 mcg. TSH 1.56 in oct 2019  Osteoporosis - she started prolia injections in July 2019. She has GERD which would increase adverse events if she took an oral bisphosphonate.She also has dementia and compliance may be an issue with oral weekly medication. T score -2.8 on  Oct 2018 DXA.last prolia jan 2020. Continues on vit D and wight bearing   Low platelet count noted on last labs, hematologist referral completed, without concern and will monitor.   Weight loss- reports she continues to lose weight. Reports she is at 120 lbs, (previous weight has been 120 and appears to be stable)   Review of Systems:  Review of Systems  Constitutional: Positive for weight loss. Negative for chills and fever.  HENT: Negative for tinnitus.   Respiratory: Negative for cough, sputum production and shortness of breath.   Cardiovascular: Negative for chest pain, palpitations and leg swelling.  Gastrointestinal: Negative for abdominal pain, constipation, diarrhea and heartburn.  Genitourinary: Negative for dysuria, frequency and urgency.  Musculoskeletal: Positive for back pain and joint pain. Negative for falls and myalgias.  Skin: Negative.   Neurological: Negative for dizziness and headaches.  Endo/Heme/Allergies: Bruises/bleeds easily (stable, without increase).  Psychiatric/Behavioral: Positive for memory loss. Negative for depression. The patient does not have insomnia.     Past Medical History:  Diagnosis Date  . Arthritis   . Cancer [facility])    left breast cancer   . CHF (congestive heart failure) ([place])    PACEMAKER & DEFIB  . Complication of anesthesia    hypotensive after back surgery in 2006- reports on chart   . Depression   . Dyslipidemia   . Fainted 04/21/06   AT [place]  . GERD (gastroesophageal reflux disease)   . Headache(784.0)   . Hearing loss   . HLD (hyperlipidemia)   . Hypertension   . Hypothyroidism   . ICD (implantable cardiac defibrillator) in place    pt has pacer/icd  . ICD (implantable cardiac defibrillator), biventricular, in situ   . LBBB (left bundle branch block)   . Memory loss   . Nonischemic cardiomyopathy ([place])   . Normal coronary arteries    s/p cardiac cath 2007  . Pacemaker    Guidant Device  . Syncope   . Systolic CHF  ([place])   . Vertigo   . Wears glasses    Past Surgical History:  Procedure Laterality Date  . BACK SURGERY     lumbar fusion   . BREAST SURGERY  2000   LUMP REMOVAL. STAGE 1 CANCER  . CARDIAC CATHETERIZATION    . CATARACT EXTRACTION    . EYE SURGERY    . IMPLANTABLE CARDIOVERTER DEFIBRILLATOR GENERATOR CHANGE N/A 12/18/2012   Procedure: IMPLANTABLE CARDIOVERTER DEFIBRILLATOR GENERATOR CHANGE;  Surgeon: [name], MD;  Location: [facility] CATH LAB;  Service: Cardiovascular;  Laterality: N/A;  . JOINT REPLACEMENT  06/14/01   right  . LUMBAR FUSION  2006  . MASS EXCISION  11/08/2011   Procedure: EXCISION MASS;  Surgeon: [name], MD;  Location: [facility] ORS;  Service: General;  Laterality: Left;  Excision Left Thigh Mass  . MASTECTOMY, PARTIAL  2008   GOT PACEMAKER AND DEFIB AT THAT TIME  . PACEMAKER INSERTION  04/23/06  . TOTAL KNEE ARTHROPLASTY  05/17/01   RIGHT KNEE  . TOTAL KNEE ARTHROPLASTY Left 11/29/2014   Procedure: TOTAL LEFT KNEE ARTHROPLASTY;  Surgeon: Paralee Cancel, MD;  Location: [facility]  ORS;  Service: Orthopedics;  Laterality: Left;   Social History:   reports that she has never smoked. She has never used smokeless tobacco. She reports that she does not drink alcohol or use drugs.  Family History  Problem Relation Age of Onset  . Hypertension Mother   . Arthritis Mother   . Hypertension Father   . Hypertension Brother   . Hypertension Brother     Medications: Patient's Medications  New Prescriptions   No medications on file  Previous Medications   ACETAMINOPHEN (TYLENOL) 325 MG TABLET    Take 2 tablets (650 mg total) by mouth every 6 (six) hours as needed for mild pain (or Fever >/= 101).   BACLOFEN 5 MG TABS    Take 5 mg by mouth 2 (two) times daily as needed.   BUPROPION (WELLBUTRIN XL) 150 MG 24 HR TABLET    Take 3 tablets (450 mg total) by mouth daily.   CARVEDILOL (COREG) 3.125 MG TABLET    Take 1 tablet (3.125 mg total) by mouth 2 (two) times daily.   CHOLECALCIFEROL  (VITAMIN D) 1000 UNITS TABLET    Take 1,000 Units by mouth daily.   FUROSEMIDE (LASIX) 20 MG TABLET    Take 1 tablet (20 mg total) by mouth daily.   LEVOTHYROXINE (SYNTHROID, LEVOTHROID) 100 MCG TABLET    Take 1 tablet (100 mcg total) by mouth daily before breakfast.   MAGNESIUM 250 MG TABS    Take 250 mg by mouth daily.   NONFORMULARY OR COMPOUNDED ITEM    APPLY 1-2 GMS TO AFFECTED AREA 3-4 TIMES DAILY AS NEEDED FOR PAIN. THIS IS A COMPOUNDED CREAM.1 PUMP EQUALS 1 GRAM.   OMEGA-3 FATTY ACIDS (FISH OIL) 1200 MG CAPS    Take 1,200 mg by mouth every morning.   OMEPRAZOLE (PRILOSEC) 40 MG CAPSULE    TAKE 1 CAPSULE BY MOUTH  DAILY FOR STOMACH   OXCARBAZEPINE (TRILEPTAL) 300 MG TABLET    Take 300 mg by mouth every morning.    POTASSIUM CHLORIDE (K-DUR,KLOR-CON) 10 MEQ TABLET    Take 1 tablet (10 mEq total) by mouth daily.   SERTRALINE (ZOLOFT) 100 MG TABLET    Take 200 mg by mouth daily.    SPIRONOLACTONE (ALDACTONE) 25 MG TABLET    Take 1 tablet (25 mg total) by mouth every morning.   VITAMIN E 1000 UNIT CAPSULE    Take 1,000 Units by mouth daily.  Modified Medications   No medications on file  Discontinued Medications   HYDROCODONE-ACETAMINOPHEN (NORCO/VICODIN) 5-325 MG TABLET    Take 1 tablet by mouth every 6 (six) hours as needed.   PREDNISONE (DELTASONE) 10 MG TABLET    Take 2 tabs BID for 3 days then one tab BID for 3 days then stop     Physical Exam: Unable due to tele-visit  Labs reviewed: Basic Metabolic Panel: Recent Labs    08/30/17 1251 02/05/18 1004 07/11/18 0956  NA 141 142 141  K 5.1 4.5 4.5  CL 104 106 104  CO2 30 30 30   GLUCOSE 77 88 93  BUN 27* 30* 23  CREATININE 0.99* 0.96* 0.92*  CALCIUM 10.6* 10.2 9.8  TSH 2.21 1.56  --    Liver Function Tests: Recent Labs    08/30/17 1251 02/05/18 1004  AST  --  16  ALT 17 22  BILITOT  --  0.3  PROT  --  6.7   No results for input(s): LIPASE, AMYLASE in the last 8760 hours.  No results for input(s): AMMONIA in the  last 8760 hours. CBC: Recent Labs    08/30/17 1251 02/05/18 1004  WBC 8.6 9.2  NEUTROABS 5,289 6,256  HGB 12.8 13.1  HCT 38.3 40.5  MCV 85.1 86.5  PLT 134* 117*   Lipid Panel: Recent Labs    08/30/17 1251 02/05/18 1004  CHOL 181 175  HDL 56 54  LDLCALC 98 100*  TRIG 175* 110  CHOLHDL 3.2 3.2   TSH: Recent Labs    08/30/17 1251 02/05/18 1004  TSH 2.21 1.56   A1C: No results found for: HGBA1C   Assessment/Plan 1. Age-related osteoporosis without current pathological fracture -continues on prolia, vit d and weight bearing exercise - COMPLETE METABOLIC PANEL WITH GFR; Future  2. Hypothyroidism due to acquired atrophy of thyroid -continues on synthroid.  - TSH; Future  3. Mild cognitive impairment Stable, no notable changes. Did not tolerate namenda or aricept.   4. Recurrent major depressive disorder, in partial remission (HCC) Stable on current regimen. Following with psych.  5. Benign hypertension -has been controlled at previous OV, no bp readings reported today. Will continue current regimen  6. Chronic systolic heart failure ([place]) Continue follow up with cardiology, no increase in shortness of breath, swelling or chest pain. Will continue current regimen.  7. Atherosclerosis of native artery of both lower extremities with intermittent claudication (HCC) Stable, following with vascular.  - CBC with Differential/Platelet; Future  8. Weight loss Stable, reports weight at 120 lbs. Per records this has been stable. Encouraged 3 meals a day with proper protein intake and supplement daily.   9. Mixed hyperlipidemia Diet controlled.  - Lipid panel; Future - COMPLETE METABOLIC PANEL WITH GFR; Future  10. Easy bruising Controlled. Hematology follow up without additional recommendations. Will monitor.  - CBC with Differential/Platelet; Future  11. Constipation, unspecified constipation type Stable with diet.  12. Chronic right-sided low back pain with  right-sided sciatica Ongoing, tylenol not effective. Had been seeing Dr Nelva Bush but not been back in several months. Reports she will follow up with him at this time for ongoing management vs neurosurgery referral. She has already completed PT twice without benefit.    Next appt: 4 months with labs before.  Carlos American. Harle Battiest  [facility] [phone]  Follow Up Instructions:    I discussed the assessment and treatment plan with the patient. The patient was provided an opportunity to ask questions and all were answered. The patient agreed with the plan and demonstrated an understanding of the instructions.   The patient was advised to call back or seek an in-person evaluation if the symptoms worsen or if the condition fails to improve as anticipated.  I provided 17 minutes of non-face-to-face time during this encounter.   [name], NP

## 2018-07-29 ENCOUNTER — Telehealth: Payer: Self-pay

## 2018-07-29 NOTE — Telephone Encounter (Signed)
I called and left a message for patient to call us back in regards to her appt on Thursday. Please advise patient about the Covid 19 precautions and offer her a virtual visit. It looks like she was able to do a telephone visit with her PCP a couple of weeks ago and if she wants to do that with Dr. Krista Blue, that is fine.

## 2018-07-30 ENCOUNTER — Other Ambulatory Visit: Payer: Self-pay | Admitting: Nurse Practitioner

## 2018-07-30 NOTE — Telephone Encounter (Addendum)
4-22 pt has called and gave verbal consent to file insurance for a Telephone visit with Dr Krista Blue on 04-23 @ 10:30.  Pt declined a VV due to not having Internet access.

## 2018-07-30 NOTE — Telephone Encounter (Signed)
She has been added to Dr. Rhea Belton schedule.

## 2018-07-31 ENCOUNTER — Encounter: Payer: Self-pay | Admitting: Neurology

## 2018-07-31 ENCOUNTER — Other Ambulatory Visit: Payer: Self-pay | Admitting: *Deleted

## 2018-07-31 ENCOUNTER — Other Ambulatory Visit: Payer: Self-pay

## 2018-07-31 ENCOUNTER — Ambulatory Visit (INDEPENDENT_AMBULATORY_CARE_PROVIDER_SITE_OTHER): Payer: Medicare Other | Admitting: Neurology

## 2018-07-31 DIAGNOSIS — R269 Unspecified abnormalities of gait and mobility: Secondary | ICD-10-CM | POA: Diagnosis not present

## 2018-07-31 DIAGNOSIS — R413 Other amnesia: Secondary | ICD-10-CM

## 2018-07-31 MED ORDER — OMEPRAZOLE 40 MG PO CPDR: DELAYED_RELEASE_CAPSULE | ORAL | 1 refills | Status: DC

## 2018-07-31 NOTE — Progress Notes (Signed)
[facility]  PATIENT: [name] DOB: [birth year]   HISTORY OF PRESENT ILLNESS:HISTORY [name] is a 85 yo RH AAF referred by her primary care Dr. Baird Cancer for evaluation of memory trouble, drive herself to office alone at today's clinical visit.  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 Nicholes Calamity under general anesthesia, woke up from surgery, she noticed mild memory trouble, she has short-term memory trouble, has been persistent since then, she denies difficulty talking, no strokelike symptoms then. She lives at home with her family, highly functional, driving, independent at daily activity, able to keep her check in balance, She suffered long-standing history of bipolar disorder, on polypharmacy treatment, this including Trileptal 300 mg daily, Zoloft 100 mg a day, Wellbutrin 150 mg 3 tablets a day, She had accident of sudden onset dizziness couple days ago, after dinner, she felt lightheaded, has to crawling upstairs, was helped by her husband to get up, then she fell to the ground, whole-body shaking, no loss of consciousness. She has baseline mild gait difficulty due to her low back pain, bilateral knee pain, She denied a family history of dementia, CT head in 2013, Unchanged mild atrophy and microvascular ischemic disease without acute intracranial process. She had a history of chronic systolic heart failure, left bundle branch block, status post biventricular ICD insertion in 2009, underwent device generator change out Sept 2014. S She presents for one year evaluation. She reports feeling dizzy Tuesday night with improvement by Wednesday morning. She also reports upper body "shaking". The only pain she has is from arthritis. She gets some mild edema in her right ankle. She reports her blood pressure  at home usually runs 110-120/60-70. Sometimes when she gets up quickly, she felt lightheaded.  UPDATE April 8th 2016: She is overall doing very well, only has occasionally dizziness, especially when she gets up quickly, today's Mini-Mental status examination is 29 out of 30 CAT scan of the brain showed mild small vessel disease, no acute lesions, EEG showed mild slowing  UPDATE July 16 2016:YY She drive here herself, she continue complains of worsening memory loss, she lives with her husband in their house of more than 5 years old, she is independent in her daily activity, exercise regularly,  Following senior emexercise program on TV, she has mild low back pain, cause mild gait difficulty sometimes, radiating pain to bilateral lower extremity, she has no bowel and bladder incontinence.  Virtual Visit via Telephone  I connected with [name] on 07/31/18 at  by telephone and verified that I am speaking with the correct person using two identifiers.   I discussed the limitations, risks, security and privacy concerns of performing an evaluation and management service by telephone and the availability of in person appointments. I also discussed with the patient that there may be a patient responsible charge related to this service. The patient expressed understanding and agreed to proceed.   History of Present Illness: She complains of worsening gait abnormalities and memory loss, she lives with her husband, she still drives, stumble a lot,  Previously, she has tried aricept and namenda, could not tolerate the side effect.I have reviewed and agreed above plan.  Observations/Objective: I have reviewed problem lists, medications, allergies. Awake, alert, able to provide good history and medication list.  Assessment and Plan: Mild Cognitive Impairment  Slow worsening  Could not tolerate Aricept and Namenda,   Emphasize the importance of moderate exercise.      Follow Up  Instructions:     I discussed the assessment and treatment plan with the patient. The patient was provided an opportunity to ask questions and all were answered. The patient agreed with the plan and demonstrated an understanding of the instructions.   The patient was advised to call back or seek an in-person evaluation if the symptoms worsen or if the condition fails to improve as anticipated.  I provided 21 minutes of non-face-to-face time during this encounter.   Marcial Pacas, MD

## 2018-07-31 NOTE — Telephone Encounter (Signed)
[facility] 

## 2018-08-03 ENCOUNTER — Encounter: Payer: Self-pay | Admitting: Nurse Practitioner

## 2018-08-06 ENCOUNTER — Other Ambulatory Visit: Payer: Self-pay

## 2018-08-06 ENCOUNTER — Ambulatory Visit (INDEPENDENT_AMBULATORY_CARE_PROVIDER_SITE_OTHER): Payer: Medicare Other | Admitting: *Deleted

## 2018-08-06 DIAGNOSIS — I5022 Chronic systolic (congestive) heart failure: Secondary | ICD-10-CM

## 2018-08-06 DIAGNOSIS — I428 Other cardiomyopathies: Secondary | ICD-10-CM

## 2018-08-06 LAB — CUP PACEART REMOTE DEVICE CHECK
Battery Remaining Longevity: 54 mo
Battery Remaining Percentage: 90 %
Brady Statistic RA Percent Paced: 0 %
Brady Statistic RV Percent Paced: 100 %
Date Time Interrogation Session: 20200429102605
HighPow Impedance: 50 Ohm
Implantable Lead Implant Date: 20080116
Implantable Lead Implant Date: 20080116
Implantable Lead Implant Date: 20080116
Implantable Lead Location: 753859
Implantable Lead Location: 753860
Implantable Lead Location: 753860
Implantable Lead Model: 157
Implantable Lead Model: 4469
Implantable Lead Model: 4555
Implantable Lead Serial Number: 136532
Implantable Lead Serial Number: 161542
Implantable Lead Serial Number: 473495
Implantable Pulse Generator Implant Date: 20140911
Lead Channel Impedance Value: 416 Ohm
Lead Channel Impedance Value: 533 Ohm
Lead Channel Impedance Value: 873 Ohm
Lead Channel Pacing Threshold Amplitude: 0.6 V
Lead Channel Pacing Threshold Amplitude: 0.7 V
Lead Channel Pacing Threshold Amplitude: 0.8 V
Lead Channel Pacing Threshold Pulse Width: 0.4 ms
Lead Channel Pacing Threshold Pulse Width: 0.4 ms
Lead Channel Pacing Threshold Pulse Width: 0.8 ms
Lead Channel Setting Pacing Amplitude: 2 V
Lead Channel Setting Pacing Amplitude: 2 V
Lead Channel Setting Pacing Amplitude: 2.4 V
Lead Channel Setting Pacing Pulse Width: 0.4 ms
Lead Channel Setting Pacing Pulse Width: 0.8 ms
Lead Channel Setting Sensing Sensitivity: 0.6 mV
Lead Channel Setting Sensing Sensitivity: 1 mV
Pulse Gen Serial Number: 111765

## 2018-08-14 ENCOUNTER — Telehealth: Payer: Self-pay | Admitting: *Deleted

## 2018-08-14 MED ORDER — NONFORMULARY OR COMPOUNDED ITEM: 2 refills | Status: DC

## 2018-08-14 NOTE — Telephone Encounter (Signed)
Rx faxed to [facility]

## 2018-08-14 NOTE — Telephone Encounter (Signed)
This is fine for [place] to prescribe.

## 2018-08-14 NOTE — Telephone Encounter (Signed)
Patient called and stated that she wants a refill on her [facility] medication that Dr. Lorre Nick prescribed. She no longer sees him. Stated that it is  Diclofenac-Baclofen 2% 120 grams Apply 1-2 grams topically four times daily before application of cram rub 2 minutes to activate max penetration.   Wants it sent to [facility] [place] [address]  Please Advise.

## 2018-08-15 NOTE — Progress Notes (Signed)
Remote ICD transmission.   

## 2018-08-18 NOTE — Telephone Encounter (Signed)
Received fax from [employer] [phone] Fax: [phone] and stated that the original prescription was not covered by the patient's insurance. Please consider changing to the prescription indicated for a more cost effective option:  Lidocaine 2.5%, Prilocaine 2.5% Topical Cream Apply 4gm 3-4 times daily. 480gm  Placed Rx in [place] folder to review and sign.

## 2018-10-02 ENCOUNTER — Other Ambulatory Visit: Payer: Self-pay | Admitting: Nurse Practitioner

## 2018-10-23 ENCOUNTER — Other Ambulatory Visit: Payer: Self-pay | Admitting: *Deleted

## 2018-10-23 MED ORDER — FUROSEMIDE 20 MG PO TABS: 20.0000 mg | ORAL_TABLET | Freq: Every day | ORAL | 1 refills | Status: DC

## 2018-10-23 NOTE — Telephone Encounter (Signed)
[facility] 

## 2018-10-28 ENCOUNTER — Ambulatory Visit: Payer: Medicare Other

## 2018-10-29 ENCOUNTER — Other Ambulatory Visit: Payer: Self-pay

## 2018-10-29 ENCOUNTER — Ambulatory Visit (INDEPENDENT_AMBULATORY_CARE_PROVIDER_SITE_OTHER): Payer: Medicare Other | Admitting: Nurse Practitioner

## 2018-10-29 DIAGNOSIS — M810 Age-related osteoporosis without current pathological fracture: Secondary | ICD-10-CM

## 2018-10-29 MED ORDER — DENOSUMAB 60 MG/ML ~~LOC~~ SOSY
60.0000 mg | PREFILLED_SYRINGE | Freq: Once | SUBCUTANEOUS | Status: AC
  Administered 2018-10-29: 60 mg via SUBCUTANEOUS

## 2018-11-05 ENCOUNTER — Ambulatory Visit (INDEPENDENT_AMBULATORY_CARE_PROVIDER_SITE_OTHER): Payer: Medicare Other | Admitting: *Deleted

## 2018-11-05 DIAGNOSIS — I5022 Chronic systolic (congestive) heart failure: Secondary | ICD-10-CM | POA: Diagnosis not present

## 2018-11-05 DIAGNOSIS — I639 Cerebral infarction, unspecified: Secondary | ICD-10-CM

## 2018-11-05 LAB — CUP PACEART REMOTE DEVICE CHECK
Battery Remaining Longevity: 54 mo
Battery Remaining Percentage: 85 %
Brady Statistic RA Percent Paced: 0 %
Brady Statistic RV Percent Paced: 100 %
Date Time Interrogation Session: 20200729120500
HighPow Impedance: 50 Ohm
Implantable Lead Implant Date: 20080116
Implantable Lead Implant Date: 20080116
Implantable Lead Implant Date: 20080116
Implantable Lead Location: 753859
Implantable Lead Location: 753860
Implantable Lead Location: 753860
Implantable Lead Model: 157
Implantable Lead Model: 4469
Implantable Lead Model: 4555
Implantable Lead Serial Number: 136532
Implantable Lead Serial Number: 161542
Implantable Lead Serial Number: 473495
Implantable Pulse Generator Implant Date: 20140911
Lead Channel Impedance Value: 408 Ohm
Lead Channel Impedance Value: 583 Ohm
Lead Channel Impedance Value: 871 Ohm
Lead Channel Pacing Threshold Amplitude: 0.6 V
Lead Channel Pacing Threshold Amplitude: 0.7 V
Lead Channel Pacing Threshold Amplitude: 0.8 V
Lead Channel Pacing Threshold Pulse Width: 0.4 ms
Lead Channel Pacing Threshold Pulse Width: 0.4 ms
Lead Channel Pacing Threshold Pulse Width: 0.8 ms
Lead Channel Setting Pacing Amplitude: 2 V
Lead Channel Setting Pacing Amplitude: 2 V
Lead Channel Setting Pacing Amplitude: 2.4 V
Lead Channel Setting Pacing Pulse Width: 0.4 ms
Lead Channel Setting Pacing Pulse Width: 0.8 ms
Lead Channel Setting Sensing Sensitivity: 0.6 mV
Lead Channel Setting Sensing Sensitivity: 1 mV
Pulse Gen Serial Number: 111765

## 2018-11-10 ENCOUNTER — Other Ambulatory Visit: Payer: Self-pay

## 2018-11-10 ENCOUNTER — Other Ambulatory Visit: Payer: Medicare Other

## 2018-11-10 DIAGNOSIS — E782 Mixed hyperlipidemia: Secondary | ICD-10-CM

## 2018-11-10 DIAGNOSIS — M810 Age-related osteoporosis without current pathological fracture: Secondary | ICD-10-CM

## 2018-11-10 DIAGNOSIS — E034 Atrophy of thyroid (acquired): Secondary | ICD-10-CM

## 2018-11-10 DIAGNOSIS — R238 Other skin changes: Secondary | ICD-10-CM

## 2018-11-10 DIAGNOSIS — I70213 Atherosclerosis of native arteries of extremities with intermittent claudication, bilateral legs: Secondary | ICD-10-CM

## 2018-11-10 DIAGNOSIS — R233 Spontaneous ecchymoses: Secondary | ICD-10-CM

## 2018-11-11 LAB — LIPID PANEL
Cholesterol: 182 mg/dL (ref <200)
HDL: 60 mg/dL (ref >=50)
LDL Cholesterol (Calc): 104 mg/dL (calc) — ABNORMAL HIGH
Non-HDL Cholesterol (Calc): 122 mg/dL (calc) (ref <130)
Total CHOL/HDL Ratio: 3 (calc) (ref <5.0)
Triglycerides: 88 mg/dL (ref <150)

## 2018-11-11 LAB — CBC WITH DIFFERENTIAL/PLATELET
Absolute Monocytes: 666 cells/uL (ref 200–950)
Basophils Absolute: 30 cells/uL (ref 0–200)
Basophils Relative: 0.4 %
Eosinophils Absolute: 259 cells/uL (ref 15–500)
Eosinophils Relative: 3.5 %
HCT: 39.2 % (ref 35.0–45.0)
Hemoglobin: 12.9 g/dL (ref 11.7–15.5)
Lymphs Abs: 1598 cells/uL (ref 850–3900)
MCH: 28.1 pg (ref 27.0–33.0)
MCHC: 32.9 g/dL (ref 32.0–36.0)
MCV: 85.4 fL (ref 80.0–100.0)
MPV: 11.5 fL (ref 7.5–12.5)
Monocytes Relative: 9 %
Neutro Abs: 4847 cells/uL (ref 1500–7800)
Neutrophils Relative %: 65.5 %
Platelets: 119 10*3/uL — ABNORMAL LOW (ref 140–400)
RBC: 4.59 10*6/uL (ref 3.80–5.10)
RDW: 13.6 % (ref 11.0–15.0)
Total Lymphocyte: 21.6 %
WBC: 7.4 10*3/uL (ref 3.8–10.8)

## 2018-11-11 LAB — COMPLETE METABOLIC PANEL WITH GFR
AG Ratio: 1.4 (calc) (ref 1.0–2.5)
ALT: 25 U/L (ref 6–29)
AST: 29 U/L (ref 10–35)
Albumin: 4.1 g/dL (ref 3.6–5.1)
Alkaline phosphatase (APISO): 56 U/L (ref 37–153)
BUN/Creatinine Ratio: 31 (calc) — ABNORMAL HIGH (ref 6–22)
BUN: 27 mg/dL — ABNORMAL HIGH (ref 7–25)
CO2: 29 mmol/L (ref 20–32)
Calcium: 10.3 mg/dL (ref 8.6–10.4)
Chloride: 105 mmol/L (ref 98–110)
Creat: 0.87 mg/dL (ref 0.60–0.88)
GFR, Est African American: 72 mL/min/{1.73_m2} (ref >=60)
GFR, Est Non African American: 62 mL/min/{1.73_m2} (ref >=60)
Globulin: 2.9 g/dL (calc) (ref 1.9–3.7)
Glucose, Bld: 96 mg/dL (ref 65–99)
Potassium: 4.3 mmol/L (ref 3.5–5.3)
Sodium: 143 mmol/L (ref 135–146)
Total Bilirubin: 0.3 mg/dL (ref 0.2–1.2)
Total Protein: 7 g/dL (ref 6.1–8.1)

## 2018-11-11 LAB — TSH: TSH: 1.88 mIU/L (ref 0.40–4.50)

## 2018-11-13 NOTE — Progress Notes (Signed)
Remote ICD transmission.   

## 2018-11-14 ENCOUNTER — Ambulatory Visit (INDEPENDENT_AMBULATORY_CARE_PROVIDER_SITE_OTHER): Payer: Medicare Other | Admitting: Nurse Practitioner

## 2018-11-14 ENCOUNTER — Other Ambulatory Visit: Payer: Self-pay

## 2018-11-14 ENCOUNTER — Encounter: Payer: Self-pay | Admitting: Nurse Practitioner

## 2018-11-14 VITALS — BP 118/70 | HR 75 | Temp 98.1°F | Resp 20 | Ht <= 58 in | Wt 114.4 lb

## 2018-11-14 DIAGNOSIS — K5900 Constipation, unspecified: Secondary | ICD-10-CM

## 2018-11-14 DIAGNOSIS — R10814 Left lower quadrant abdominal tenderness: Secondary | ICD-10-CM

## 2018-11-14 DIAGNOSIS — I5022 Chronic systolic (congestive) heart failure: Secondary | ICD-10-CM

## 2018-11-14 DIAGNOSIS — F3341 Major depressive disorder, recurrent, in partial remission: Secondary | ICD-10-CM

## 2018-11-14 DIAGNOSIS — M810 Age-related osteoporosis without current pathological fracture: Secondary | ICD-10-CM

## 2018-11-14 DIAGNOSIS — E034 Atrophy of thyroid (acquired): Secondary | ICD-10-CM | POA: Diagnosis not present

## 2018-11-14 DIAGNOSIS — R634 Abnormal weight loss: Secondary | ICD-10-CM

## 2018-11-14 DIAGNOSIS — E782 Mixed hyperlipidemia: Secondary | ICD-10-CM | POA: Diagnosis not present

## 2018-11-14 NOTE — Patient Instructions (Signed)
CT of abdomen ordered, they will call you with an appt.  If you do not hear back about CT in a week please call our office back and ask to speak with Lattie Haw (our referral coordinator) for an update.  To follow up in 2 months on weight

## 2018-11-14 NOTE — Progress Notes (Signed)
Careteam: Patient Care Team: [name], NP as PCP - General (Geriatric Medicine) [name], MD as Consulting Physician (General Surgery) Paralee Cancel, MD as Consulting Physician (Orthopedic Surgery) Marica Otter, [place] (Optometry) Marcial Pacas, MD as Consulting Physician (Neurology) [name], MD as Consulting Physician (Cardiology) Ricard Dillon, MD (Psychiatry)  Advanced Directive information    Allergies  Allergen Reactions   Iodine Shortness Of Breath    Iodine contrast, CHF , SOB   Shellfish Allergy Shortness Of Breath   Memantine     Malaise, fogginess/ couldn't think   Aspirin Nausea And Vomiting   Codeine Nausea And Vomiting    Chief Complaint  Patient presents with   Medical Management of Chronic Issues    4 Month Follow Up     HPI: Patient is a 85 y.o. female seen in the office today for routine follow up. Labs reviewed with pt.   Chronic back pain - she has lumbar spondylosis and is s/p back sx; followed by Ortho Dr Nelva Bush. She receives steroid injections periodically.   Currently taking tylenol 500 mg 2 tablets daily. When she takes this does not really provide much improvement to her back. 9-10/10. No numbness or tingling. Has completed PT twice. Pt states that he did an injection but she did not feel it.   GERD- stable on omeprazole, no symptoms currently.   PAD -stable, no worsening of pain ABI left 0.55(prev 0.56);right 1.06(prev 1.03) in June 2019. She was told by vascular sx that she had varicose veins  Dementia/MCI- reports this is not good. 5mg  aricept caused next day sedation and she stopped med. Followed by neuro Dr Krista Blue every 6 months. MMSE 27/30.She has failed namenda, aricept and exelon patch 2/2 ADRs. Still driving.  Depression- mood is down,She is followed by psychiatrist;  Dr Reece Levy for psych. Has a upcoming visit. Currently taking sertraline, trileptal and wellbutrin xl. She has never had counseling-  attempted once but did not work out.. No SI or HI.   Chronic systolic HF/AICD&pacer/LBBB/NICM - followed by cardiology Dr Lovena Le. She takes lasix with K supplement, lopressor, benicar, aldactone. EF 45-50% in 2015 (prev 20% in 2008). Pacerwhich is checked per cardiologist.  Hyperlipidemia - diet controlled. She also takes flax seed oil. [id number]   Hypothyroidism - stable on  100 mcg. HYW7.37   Osteoporosis - she started prolia injections in July 2019. She has GERD which would increase adverse events if she took an oral bisphosphonate.She also has dementia and compliance may be an issue with oral weekly medication. T score -2.8 on Oct 2018 DXA.last prolia July 2020. Continues on vit D and wight bearing exercises  Low platelet count noted on last labs, hematologist referral completed, without concern and will monitor- plts 119 on last labs  Weight loss- reports she continues to lose weight. States she eats 3 meals a day. Cheese toast for breakfast, cereal for lunch with boost and fruit, chicken with starch and vegetable.  Snack between lunch and dinner- cashews and cereal.  Drinking skim milk.  Staying active most the day with house work.   Review of Systems:  Review of Systems  Constitutional: Positive for weight loss. Negative for chills and fever.  HENT: Negative for tinnitus.   Respiratory: Negative for cough, sputum production and shortness of breath.   Cardiovascular: Negative for chest pain, palpitations and leg swelling.  Gastrointestinal: Negative for abdominal pain, constipation, diarrhea and heartburn.  Genitourinary: Negative for dysuria, frequency and urgency.  Musculoskeletal: Positive  for back pain and joint pain. Negative for falls and myalgias.  Skin: Negative.   Neurological: Negative for dizziness and headaches.  Endo/Heme/Allergies: Bruises/bleeds easily (stable, without increase).  Psychiatric/Behavioral: Positive for memory loss. Negative for depression.  The patient does not have insomnia.     Past Medical History:  Diagnosis Date   Arthritis    Cancer [facility])    left breast cancer    CHF (congestive heart failure) ([place])    PACEMAKER & DEFIB   Complication of anesthesia    hypotensive after back surgery in 2006- reports on chart    Depression    Dyslipidemia    Fainted 04/21/06   AT [facility]   GERD (gastroesophageal reflux disease)    Headache(784.0)    Hearing loss    HLD (hyperlipidemia)    Hypertension    Hypothyroidism    ICD (implantable cardiac defibrillator) in place    pt has pacer/icd   ICD (implantable cardiac defibrillator), biventricular, in situ    LBBB (left bundle branch block)    Memory loss    Nonischemic cardiomyopathy ([place])    Normal coronary arteries    s/p cardiac cath 2007   Pacemaker    Guidant Device   Syncope    Systolic CHF ([place])    Vertigo    Wears glasses    Past Surgical History:  Procedure Laterality Date   BACK SURGERY     lumbar fusion    BREAST SURGERY  2000   LUMP REMOVAL. STAGE 1 CANCER   CARDIAC CATHETERIZATION     CATARACT EXTRACTION     EYE SURGERY     IMPLANTABLE CARDIOVERTER DEFIBRILLATOR GENERATOR CHANGE N/A 12/18/2012   Procedure: IMPLANTABLE CARDIOVERTER DEFIBRILLATOR GENERATOR CHANGE;  Surgeon: [name], MD;  Location: [facility] CATH LAB;  Service: Cardiovascular;  Laterality: N/A;   JOINT REPLACEMENT  06/14/01   right   LUMBAR FUSION  2006   MASS EXCISION  11/08/2011   Procedure: EXCISION MASS;  Surgeon: [name], MD;  Location: [facility] ORS;  Service: General;  Laterality: Left;  Excision Left Thigh Mass   MASTECTOMY, PARTIAL  2008   GOT PACEMAKER AND DEFIB AT THAT TIME   PACEMAKER INSERTION  04/23/06   TOTAL KNEE ARTHROPLASTY  05/17/01   RIGHT KNEE   TOTAL KNEE ARTHROPLASTY Left 11/29/2014   Procedure: TOTAL LEFT KNEE ARTHROPLASTY;  Surgeon: Paralee Cancel, MD;  Location: [facility] ORS;  Service: Orthopedics;  Laterality: Left;   Social History:    reports that she has never smoked. She has never used smokeless tobacco. She reports that she does not drink alcohol or use drugs.  Family History  Problem Relation Age of Onset   Hypertension Mother    Arthritis Mother    Hypertension Father    Hypertension Brother    Hypertension Brother     Medications: Patient's Medications  New Prescriptions   No medications on file  Previous Medications   ACETAMINOPHEN (TYLENOL) 325 MG TABLET    Take 2 tablets (650 mg total) by mouth every 6 (six) hours as needed for mild pain (or Fever >/= 101).   BACLOFEN 5 MG TABS    Take 5 mg by mouth 2 (two) times daily as needed.   BUPROPION (WELLBUTRIN XL) 150 MG 24 HR TABLET    Take 3 tablets (450 mg total) by mouth daily.   CHOLECALCIFEROL (VITAMIN D) 1000 UNITS TABLET    Take 1,000 Units by mouth daily.   FUROSEMIDE (LASIX) 20 MG TABLET  Take 1 tablet (20 mg total) by mouth daily.   LEVOTHYROXINE (SYNTHROID, LEVOTHROID) 100 MCG TABLET    Take 1 tablet (100 mcg total) by mouth daily before breakfast.   MAGNESIUM 250 MG TABS    Take 250 mg by mouth daily.   NONFORMULARY OR COMPOUNDED ITEM    Diclofenac-Baclofen 2% YWV:PXTGG 1-2 grams topically four times daily. Before application of cream rub 2 minutes to active max penetration. Quantity 120 grams   OMEGA-3 FATTY ACIDS (FISH OIL) 1200 MG CAPS    Take 1,200 mg by mouth every morning.   OMEPRAZOLE (PRILOSEC) 40 MG CAPSULE    Take one capsule by mouth once daily for stomach   OXCARBAZEPINE (TRILEPTAL) 300 MG TABLET    Take 300 mg by mouth every morning.    POTASSIUM CHLORIDE (K-DUR) 10 MEQ TABLET    TAKE 1 TABLET BY MOUTH  DAILY   SERTRALINE (ZOLOFT) 100 MG TABLET    Take 200 mg by mouth daily.    SPIRONOLACTONE (ALDACTONE) 25 MG TABLET    Take 1 tablet (25 mg total) by mouth every morning.   VITAMIN E 1000 UNIT CAPSULE    Take 1,000 Units by mouth daily.  Modified Medications   No medications on file  Discontinued Medications   CARVEDILOL (COREG)  3.125 MG TABLET    Take 1 tablet (3.125 mg total) by mouth 2 (two) times daily.    Physical Exam:  Vitals:   11/14/18 1001  BP: 118/70  Pulse: 75  Resp: 20  Temp: 98.1 F (36.7 C)  TempSrc: Oral  SpO2: 95%  Weight: 114 lb 6.4 oz (51.9 kg)  Height: 4\' 10"  (1.473 m)   Body mass index is 23.91 kg/m. Wt Readings from Last 3 Encounters:  11/14/18 114 lb 6.4 oz (51.9 kg)  05/23/18 120 lb (54.4 kg)  04/23/18 120 lb (54.4 kg)    Physical Exam Constitutional:      General: She is not in acute distress.    Appearance: She is well-developed. She is not diaphoretic.  HENT:     Head: Normocephalic and atraumatic.     Mouth/Throat:     Pharynx: No oropharyngeal exudate.  Eyes:     Conjunctiva/sclera: Conjunctivae normal.     Pupils: Pupils are equal, round, and reactive to light.  Neck:     Musculoskeletal: Normal range of motion and neck supple.  Cardiovascular:     Rate and Rhythm: Normal rate and regular rhythm.     Heart sounds: Normal heart sounds.  Pulmonary:     Effort: Pulmonary effort is normal.     Breath sounds: Normal breath sounds.  Abdominal:     General: Bowel sounds are normal.     Palpations: Abdomen is soft.  Musculoskeletal:        General: No tenderness.  Skin:    General: Skin is warm and dry.  Neurological:     Mental Status: She is alert and oriented to person, place, and time.    Labs reviewed: Basic Metabolic Panel: Recent Labs    02/05/18 1004 07/11/18 0956 11/10/18 0904  NA 142 141 143  K 4.5 4.5 4.3  CL 106 104 105  CO2 30 30 29   GLUCOSE 88 93 96  BUN 30* 23 27*  CREATININE 0.96* 0.92* 0.87  CALCIUM 10.2 9.8 10.3  TSH 1.56  --  1.88   Liver Function Tests: Recent Labs    02/05/18 1004 11/10/18 0904  AST 16 29  ALT 22 25  BILITOT 0.3 0.3  PROT 6.7 7.0   No results for input(s): LIPASE, AMYLASE in the last 8760 hours. No results for input(s): AMMONIA in the last 8760 hours. CBC: Recent Labs    02/05/18 1004  11/10/18 0904  WBC 9.2 7.4  NEUTROABS 6,256 4,847  HGB 13.1 12.9  HCT 40.5 39.2  MCV 86.5 85.4  PLT 117* 119*   Lipid Panel: Recent Labs    02/05/18 1004 11/10/18 0904  CHOL 175 182  HDL 54 60  LDLCALC 100* 104*  TRIG 110 88  CHOLHDL 3.2 3.0   TSH: Recent Labs    02/05/18 1004 11/10/18 0904  TSH 1.56 1.88   A1C: No results found for: HGBA1C   Assessment/Plan 1. Abdominal tenderness of left lower quadrant, rebound tenderness presence not specified -in the setting of weight loss and fullness - CT Abdomen Pelvis W Contrast; Future  2. Constipation, unspecified constipation type Occasional problems with constipation however no changes in bowel habits.   3. Hypothyroidism due to acquired atrophy of thyroid TSH stable, continues on synthroid 100 mcg   4. Mixed hyperlipidemia LDL stable  5. Age-related osteoporosis without current pathological fracture Continues on Prolia, she is active and weight bearing.   6. Chronic systolic heart failure (HCC) Stable, without chest pains, swelling or shortness of breath noted.   7. Recurrent major depressive disorder, in partial remission ([place]) Not controlled but has set up following psych services. Continues on Wellbutrin and zoloft. no SI or HI. encouraged to keep appt.  8. Abnormal weight loss -appears to be getting adequate nutrition however unsure of serving size and pt with dementia but seems like she is a fair historian. No one with her today. She has noted weight loss and fullness in lower left quad. No changes in bowel pattern she has not had a colonoscopy since 2013 -tenderness also noted on exam - CT Abdomen Pelvis W Contrast; Future  Next appt: 2 months for weight loss Avir Deruiter K. Durand, [facility] [phone]

## 2018-11-21 ENCOUNTER — Telehealth: Payer: Self-pay | Admitting: *Deleted

## 2018-11-21 ENCOUNTER — Telehealth: Payer: Self-pay | Admitting: Internal Medicine

## 2018-11-21 NOTE — Telephone Encounter (Signed)
Pt called to report that she has been having dizziness since Wed 11/19/18.Stacey Kitchenshe has history of vertigo and usually it comes and goes but it has lasted since Wednesday. She denies chest pain, headache, sinus problems, no edema, no sob... she called her PCP and according to their notes.. they have tried to call her back for a virtual visit for Monday 11/24/18. Her BP 130/81, HR 54. She is asking to send a remote transmission to be sure it looks okay.. she will monitor her BP over the weekend.. she will try to eat better and drink enough fluids. To try and get back in touch with her PCP office about her Monday appt. Will forward to Dr. Lovena Le for review when he returns. She will go to the ER if anything worsens over the weekend.

## 2018-11-21 NOTE — Telephone Encounter (Signed)
New message   STAT if patient feels like he/she is going to faint   1) Are you dizzy now? " a little"  2) Do you feel faint or have you passed out? no  3) Do you have any other symptoms? Nausea at times  4) Have you checked your HR and BP (record if available)? 130/81 HR 54

## 2018-11-21 NOTE — Telephone Encounter (Signed)
Patient called and left message on Clinical intake and stated that she was having Dizzy Spells since last night. Stated that she thought it was because she didn't sleep well last night.   I spoke with Dinah to see if we could work her in on the schedule as a International aid/development worker and she was fixing to call her last one of the day. Stated that we could make an appointment for her for Monday and if symptoms worsen or any new to go to ER to be evaluated.   Janett Billow had already left for the day.   Tried calling patient back, but had to [facility] to return call.

## 2018-11-21 NOTE — Telephone Encounter (Signed)
Patient notified and agreed. Patient scheduled a TeleVisit for Monday with Dinah.

## 2018-11-23 NOTE — Telephone Encounter (Signed)
Noted.GT 

## 2018-11-24 ENCOUNTER — Encounter: Payer: Self-pay | Admitting: Family

## 2018-11-24 ENCOUNTER — Other Ambulatory Visit: Payer: Self-pay

## 2018-11-24 ENCOUNTER — Telehealth: Payer: Self-pay | Admitting: *Deleted

## 2018-11-24 ENCOUNTER — Ambulatory Visit (INDEPENDENT_AMBULATORY_CARE_PROVIDER_SITE_OTHER): Payer: Medicare Other | Admitting: Family

## 2018-11-24 DIAGNOSIS — R42 Dizziness and giddiness: Secondary | ICD-10-CM

## 2018-11-24 NOTE — Progress Notes (Signed)
Patient ID: [name], female   DOB: 01-23-1934, 85 y.o.   MRN: [id number] This service is provided via telemedicine  No vital signs collected/recorded due to the encounter was a telemedicine visit.   Location of patient (ex: home, work):  HOME  Patient consents to a telephone visit:  YES  Location of the provider (ex: office, home):  OFFICE  Name of any referring provider: Sherrie Mustache, NP  Names of all persons participating in the telemedicine service and their role in the encounter:  PATIENT, [name], [place], [name], NP  Time spent on call:  6:45   Provider: Dinah Ngetich FNP-C  [name], NP  Patient Care Team: [name], NP as PCP - General (Geriatric Medicine) [name], MD as Consulting Physician (General Surgery) Paralee Cancel, MD as Consulting Physician (Orthopedic Surgery) Marica Otter, Maria Antonia (Optometry) Marcial Pacas, MD as Consulting Physician (Neurology) [name], MD as Consulting Physician (Cardiology) Ricard Dillon, MD (Psychiatry)  Extended Emergency Contact Information Primary Emergency Contact: Stetzel,Basil Address: [address]          [place], [place] [address] [place] Phone: [phone] Relation: Spouse Secondary Emergency Contact: Newton,Doris Address: [address]          [place], [place] [address] [place] Phone: [phone] Relation: Other  Code Status: Full Code  Goals of care: Advanced Directive information Advanced Directives 07/14/2018  Does Patient Have a Medical Advance Directive? Yes  Type of Paramedic of [place];Living will  Does patient want to make changes to medical advance directive? -  Copy of [place] in Chart? No - copy requested  Pre-existing out of facility DNR order (yellow form or pink MOST form) -     Chief Complaint  Patient presents with  . Acute Visit    Dizzy    HPI:   Pt is a 85 y.o. female seen today for an acute visit for evaluation of dizziness.She states had some dizziness for the past two days but has improved today.she describes dizziness as " felt like I was going to fall".Also had some nausea which has improved.she denies headache,faintness,syncope,chest pain or any changes in her vision.she checks her blood pressure at home readings ranging in the 130's/70's -150's/70's with her HR in the 60's -70's. She denies any fever,chills,cough or signs of urinary tract infections.     Past Medical History:  Diagnosis Date  . Arthritis   . Cancer [facility])    left breast cancer   . CHF (congestive heart failure) ([place])    PACEMAKER & DEFIB  . Complication of anesthesia    hypotensive after back surgery in 2006- reports on chart   . Depression   . Dyslipidemia   . Fainted 04/21/06   AT [place]  . GERD (gastroesophageal reflux disease)   . Headache(784.0)   . Hearing loss   . HLD (hyperlipidemia)   . Hypertension   . Hypothyroidism   . ICD (implantable cardiac defibrillator) in place    pt has pacer/icd  . ICD (implantable cardiac defibrillator), biventricular, in situ   . LBBB (left bundle branch block)   . Memory loss   . Nonischemic cardiomyopathy ([place])   . Normal coronary arteries    s/p cardiac cath 2007  . Pacemaker    Guidant Device  . Syncope   . Systolic CHF ([place])   . Vertigo   . Wears glasses    Past Surgical  History:  Procedure Laterality Date  . BACK SURGERY     lumbar fusion   . BREAST SURGERY  2000   LUMP REMOVAL. STAGE 1 CANCER  . CARDIAC CATHETERIZATION    . CATARACT EXTRACTION    . EYE SURGERY    . IMPLANTABLE CARDIOVERTER DEFIBRILLATOR GENERATOR CHANGE N/A 12/18/2012   Procedure: IMPLANTABLE CARDIOVERTER DEFIBRILLATOR GENERATOR CHANGE;  Surgeon: [name], MD;  Location: [facility] CATH LAB;  Service: Cardiovascular;  Laterality: N/A;  . JOINT REPLACEMENT  06/14/01   right  . LUMBAR FUSION  2006  . MASS EXCISION  11/08/2011    Procedure: EXCISION MASS;  Surgeon: [name], MD;  [facility];  Service: General;  Laterality: Left;  Excision Left Thigh Mass  . MASTECTOMY, PARTIAL  2008   GOT PACEMAKER AND DEFIB AT THAT TIME  . PACEMAKER INSERTION  04/23/06  . TOTAL KNEE ARTHROPLASTY  05/17/01   RIGHT KNEE  . TOTAL KNEE ARTHROPLASTY Left 11/29/2014   Procedure: TOTAL LEFT KNEE ARTHROPLASTY;  Surgeon: Paralee Cancel, MD;  [facility];  Service: Orthopedics;  Laterality: Left;    Allergies  Allergen Reactions  . Iodine Shortness Of Breath    Iodine contrast, CHF , SOB  . Shellfish Allergy Shortness Of Breath  . Memantine     Malaise, fogginess/ couldn't think  . Aspirin Nausea And Vomiting  . Codeine Nausea And Vomiting    Outpatient Encounter Medications as of 11/24/2018  Medication Sig  . Baclofen 5 MG TABS Take 5 mg by mouth 2 (two) times daily as needed.  Marland Kitchen buPROPion (WELLBUTRIN XL) 150 MG 24 hr tablet Take 3 tablets (450 mg total) by mouth daily.  . carvedilol (COREG) 3.125 MG tablet Take 1 tablet by mouth 2 (two) times daily.  . cholecalciferol (VITAMIN D) 1000 UNITS tablet Take 1,000 Units by mouth daily.  . furosemide (LASIX) 20 MG tablet Take 1 tablet (20 mg total) by mouth daily.  Marland Kitchen levothyroxine (SYNTHROID, LEVOTHROID) 100 MCG tablet Take 1 tablet (100 mcg total) by mouth daily before breakfast.  . Magnesium 250 MG TABS Take 250 mg by mouth daily.  . NONFORMULARY OR COMPOUNDED ITEM Diclofenac-Baclofen 2% OIZ:TIWPY 1-2 grams topically four times daily. Before application of cream rub 2 minutes to active max penetration. Quantity 120 grams  . Omega-3 Fatty Acids (FISH OIL) 1200 MG CAPS Take 1,200 mg by mouth every morning.  Marland Kitchen omeprazole (PRILOSEC) 40 MG capsule Take one capsule by mouth once daily for stomach  . Oxcarbazepine (TRILEPTAL) 300 MG tablet Take 300 mg by mouth every morning.   . potassium chloride (K-DUR) 10 MEQ tablet TAKE 1 TABLET BY MOUTH  DAILY  . sertraline (ZOLOFT) 100 MG tablet  Take 200 mg by mouth daily.   Marland Kitchen spironolactone (ALDACTONE) 25 MG tablet Take 1 tablet (25 mg total) by mouth every morning.  . traMADol (ULTRAM) 50 MG tablet Take 1 tablet by mouth as needed.  . vitamin E 1000 UNIT capsule Take 1,000 Units by mouth daily.  . [DISCONTINUED] acetaminophen (TYLENOL) 325 MG tablet Take 2 tablets (650 mg total) by mouth every 6 (six) hours as needed for mild pain (or Fever >/= 101).  . [DISCONTINUED] Calcium Carbonate (CALCIUM 500 PO) Take 500 mg by mouth daily.    No facility-administered encounter medications on file as of 11/24/2018.     Review of Systems  Constitutional: Negative for appetite change, chills, fatigue and fever.  HENT: Negative for congestion, postnasal drip, rhinorrhea, sinus pressure, sinus pain,  sneezing and sore throat.   Eyes: Negative for discharge, redness, itching and visual disturbance.  Respiratory: Negative for cough, chest tightness, shortness of breath and wheezing.   Cardiovascular: Negative for chest pain, palpitations and leg swelling.  Gastrointestinal: Negative for abdominal distention, abdominal pain, constipation, diarrhea and vomiting.       Nausea for the past two days but has improved.  Endocrine: Negative for polydipsia, polyphagia and polyuria.  Genitourinary: Negative for dysuria, flank pain, frequency and urgency.  Neurological: Negative for syncope, speech difficulty, weakness, light-headedness, numbness and headaches.       Had some dizziness for the past two days but has improved today.     Immunization History  Administered Date(s) Administered  . Influenza, High Dose Seasonal PF 01/16/2018  . Influenza-Unspecified 12/08/2013, 01/10/2015, 12/09/2015, 01/12/2017  . Pneumococcal Conjugate-13 04/09/2012  . Pneumococcal Polysaccharide-23 02/28/2016  . Tdap 01/07/2013  . Zoster 08/05/2014   Pertinent  Health Maintenance Due  Topic Date Due  . INFLUENZA VACCINE  11/08/2018  . MAMMOGRAM  03/11/2019  . DEXA  SCAN  Completed  . PNA vac Low Risk Adult  Completed   Fall Risk  11/24/2018 11/14/2018 07/14/2018 04/14/2018 03/31/2018  Falls in the past year? 0 0 0 1 1  Number falls in past yr: 0 - 0 0 1  Injury with Fall? 0 0 0 0 0  Comment - - - - -    There were no vitals filed for this visit. There is no height or weight on file to calculate BMI. Physical Exam  Unable to complete on telephone visit.  Labs reviewed: Recent Labs    02/05/18 1004 07/11/18 0956 11/10/18 0904  NA 142 141 143  K 4.5 4.5 4.3  CL 106 104 105  CO2 30 30 29   GLUCOSE 88 93 96  BUN 30* 23 27*  CREATININE 0.96* 0.92* 0.87  CALCIUM 10.2 9.8 10.3   Recent Labs    02/05/18 1004 11/10/18 0904  AST 16 29  ALT 22 25  BILITOT 0.3 0.3  PROT 6.7 7.0   Recent Labs    02/05/18 1004 11/10/18 0904  WBC 9.2 7.4  NEUTROABS 6,256 4,847  HGB 13.1 12.9  HCT 40.5 39.2  MCV 86.5 85.4  PLT 117* 119*   Lab Results  Component Value Date   TSH 1.88 11/10/2018   No results found for: HGBA1C Lab Results  Component Value Date   CHOL 182 11/10/2018   HDL 60 11/10/2018   LDLCALC 104 (H) 11/10/2018   TRIG 88 11/10/2018   CHOLHDL 3.0 11/10/2018    Significant Diagnostic Results in last 30 days:  No results found.  Assessment/Plan  Dizziness B/p reviewed normal.Had symptoms 2 days ago but none today.Encouraged to increase fluid intake.Fall and safety precautions advised.Notify provider's office if symptoms recur.Will initiate meclizine if symptoms persist.   Family/ staff Communication: Reviewed plan of care with patient.   Labs/tests ordered: None  Spent 15 minutes of non-face to face with patient    Sandrea Hughs, NP

## 2018-11-24 NOTE — Telephone Encounter (Signed)
Follow Up   Patient calling back in to check about the issues that she was having on Friday and to make sure that the transmission was received. Patient is wanting a call back to discuss.

## 2018-11-24 NOTE — Telephone Encounter (Signed)
Mardene Celeste with [facility] called and stated that patient is scheduled for a CT Abdomen/Pelvis with Contrast on 8/19/220 but patient is ALLERGIC to IV Contrast. They will not be able to do with Contrast but can Without. Please Advise.

## 2018-11-24 NOTE — Telephone Encounter (Signed)
Spoke with [facility] and advised results, they will change the order.

## 2018-11-24 NOTE — Telephone Encounter (Signed)
Okay to do without contract since she is allergic

## 2018-11-24 NOTE — Patient Instructions (Signed)

## 2018-11-26 NOTE — Telephone Encounter (Signed)
Confirmed with patient remote transmission received. No alerts.

## 2018-11-27 ENCOUNTER — Ambulatory Visit
Admission: RE | Admit: 2018-11-27 | Discharge: 2018-11-27 | Disposition: A | Discharge status: Home / Self Care | Payer: Medicare Other | Source: Ambulatory Visit | Attending: Nurse Practitioner | Admitting: Nurse Practitioner

## 2018-11-27 DIAGNOSIS — R634 Abnormal weight loss: Secondary | ICD-10-CM

## 2018-11-27 DIAGNOSIS — R10814 Left lower quadrant abdominal tenderness: Secondary | ICD-10-CM

## 2018-12-10 ENCOUNTER — Other Ambulatory Visit: Payer: Self-pay | Admitting: Nurse Practitioner

## 2018-12-10 NOTE — Telephone Encounter (Signed)
High risk or very high risk warning populated when attempting to refill medication. RX request sent to PCP for review and approval if warranted.   

## 2018-12-16 ENCOUNTER — Ambulatory Visit (INDEPENDENT_AMBULATORY_CARE_PROVIDER_SITE_OTHER): Payer: Medicare Other | Admitting: Family

## 2018-12-16 ENCOUNTER — Encounter: Payer: Self-pay | Admitting: Family

## 2018-12-16 ENCOUNTER — Other Ambulatory Visit: Payer: Self-pay

## 2018-12-16 VITALS — BP 134/84 | HR 67 | Temp 97.8°F | Ht <= 58 in | Wt 117.0 lb

## 2018-12-16 DIAGNOSIS — R350 Frequency of micturition: Secondary | ICD-10-CM

## 2018-12-16 LAB — POCT URINALYSIS DIPSTICK
Blood, UA: NEGATIVE
Glucose, UA: NEGATIVE
Ketones, UA: NEGATIVE
Leukocytes, UA: NEGATIVE
Nitrite, UA: NEGATIVE
Protein, UA: NEGATIVE
Spec Grav, UA: 1.02
Urobilinogen, UA: 0.2 U/dL
pH, UA: 5

## 2018-12-16 NOTE — Progress Notes (Signed)
Provider: Dinah Ngetich FNP-C  [name], NP  Patient Care Team: [name], NP as PCP - General (Geriatric Medicine) [name], MD as Consulting Physician (General Surgery) Paralee Cancel, MD as Consulting Physician (Orthopedic Surgery) Marica Otter, [place] (Optometry) Marcial Pacas, MD as Consulting Physician (Neurology) [name], MD as Consulting Physician (Cardiology) Ricard Dillon, MD (Psychiatry)  Extended Emergency Contact Information Primary Emergency Contact: Hensarling,Basil Address: [address]          [place], [place] [address] Johnnette Litter of [place] Phone: [phone] Relation: Spouse Secondary Emergency Contact: Newton,Doris Address: [address]          [place], [place] [address] [place] of [place] Phone: [phone] Relation: Other  Code Status: Goals of care: Advanced Directive information Advanced Directives 07/14/2018  Does Patient Have a Medical Advance Directive? Yes  Type of Paramedic of [place];Living will  Does patient want to make changes to medical advance directive? -  Copy of [place] in Chart? No - copy requested  Pre-existing out of facility DNR order (yellow form or pink MOST form) -     Chief Complaint  Patient presents with  . Acute Visit    Complains of Urinary Frequency patient states duration of 4 days patient denies having any pain when urinating     HPI:  Pt is a 85 y.o. female seen today for an acute visit for evaluation of increased urine frequency x 4 days.she denies any urgency or dysuria.she states has been drinking  lots of water wonders whether this could be contributing to frequent urination.she denies any fever,chills,lower abdominal or back pain.    Past Medical History:  Diagnosis Date  . Arthritis   . Cancer [facility])    left breast cancer   . CHF (congestive heart failure) ([place])    PACEMAKER & DEFIB  . Complication of  anesthesia    hypotensive after back surgery in 2006- reports on chart   . Depression   . Dyslipidemia   . Fainted 04/21/06   AT [place]  . GERD (gastroesophageal reflux disease)   . Headache(784.0)   . Hearing loss   . HLD (hyperlipidemia)   . Hypertension   . Hypothyroidism   . ICD (implantable cardiac defibrillator) in place    pt has pacer/icd  . ICD (implantable cardiac defibrillator), biventricular, in situ   . LBBB (left bundle branch block)   . Memory loss   . Nonischemic cardiomyopathy ([place])   . Normal coronary arteries    s/p cardiac cath 2007  . Pacemaker    Guidant Device  . Syncope   . Systolic CHF ([place])   . Vertigo   . Wears glasses    Past Surgical History:  Procedure Laterality Date  . BACK SURGERY     lumbar fusion   . BREAST SURGERY  2000   LUMP REMOVAL. STAGE 1 CANCER  . CARDIAC CATHETERIZATION    . CATARACT EXTRACTION    . EYE SURGERY    . IMPLANTABLE CARDIOVERTER DEFIBRILLATOR GENERATOR CHANGE N/A 12/18/2012   Procedure: IMPLANTABLE CARDIOVERTER DEFIBRILLATOR GENERATOR CHANGE;  Surgeon: [name], MD;  Location: [facility] CATH LAB;  Service: Cardiovascular;  Laterality: N/A;  . JOINT REPLACEMENT  06/14/01   right  . LUMBAR FUSION  2006  . MASS EXCISION  11/08/2011   Procedure: EXCISION MASS;  Surgeon: [name], MD;  Location: [facility] ORS;  Service: General;  Laterality: Left;  Excision Left Thigh Mass  .  MASTECTOMY, PARTIAL  2008   GOT PACEMAKER AND DEFIB AT THAT TIME  . PACEMAKER INSERTION  04/23/06  . TOTAL KNEE ARTHROPLASTY  05/17/01   RIGHT KNEE  . TOTAL KNEE ARTHROPLASTY Left 11/29/2014   Procedure: TOTAL LEFT KNEE ARTHROPLASTY;  Surgeon: Paralee Cancel, MD;  Location: [facility] ORS;  Service: Orthopedics;  Laterality: Left;    Allergies  Allergen Reactions  . Iodine Shortness Of Breath    Iodine contrast, CHF , SOB  . Shellfish Allergy Shortness Of Breath  . Memantine     Malaise, fogginess/ couldn't think  . Aspirin Nausea And Vomiting  . Codeine  Nausea And Vomiting    Outpatient Encounter Medications as of 12/16/2018  Medication Sig  . Baclofen 5 MG TABS Take 5 mg by mouth 2 (two) times daily as needed.  Marland Kitchen buPROPion (WELLBUTRIN XL) 150 MG 24 hr tablet Take 3 tablets (450 mg total) by mouth daily.  . carvedilol (COREG) 3.125 MG tablet Take 1 tablet by mouth 2 (two) times daily.  . cholecalciferol (VITAMIN D) 1000 UNITS tablet Take 1,000 Units by mouth daily.  . furosemide (LASIX) 20 MG tablet Take 1 tablet (20 mg total) by mouth daily.  Marland Kitchen levothyroxine (SYNTHROID, LEVOTHROID) 100 MCG tablet Take 1 tablet (100 mcg total) by mouth daily before breakfast.  . Magnesium 250 MG TABS Take 250 mg by mouth daily.  . NONFORMULARY OR COMPOUNDED ITEM Diclofenac-Baclofen 2% [id number] 1-2 grams topically four times daily. Before application of cream rub 2 minutes to active max penetration. Quantity 120 grams  . Omega-3 Fatty Acids (FISH OIL) 1200 MG CAPS Take 1,200 mg by mouth every morning.  Marland Kitchen omeprazole (PRILOSEC) 40 MG capsule Take one capsule by mouth once daily for stomach  . Oxcarbazepine (TRILEPTAL) 300 MG tablet Take 300 mg by mouth every morning.   . potassium chloride (K-DUR) 10 MEQ tablet TAKE 1 TABLET BY MOUTH  DAILY  . sertraline (ZOLOFT) 100 MG tablet Take 200 mg by mouth daily.   Marland Kitchen spironolactone (ALDACTONE) 25 MG tablet TAKE 1 TABLET BY MOUTH IN  THE MORNING  . traMADol (ULTRAM) 50 MG tablet Take 1 tablet by mouth as needed.  . vitamin E 1000 UNIT capsule Take 1,000 Units by mouth daily.  . [DISCONTINUED] Calcium Carbonate (CALCIUM 500 PO) Take 500 mg by mouth daily.    No facility-administered encounter medications on file as of 12/16/2018.     Review of Systems  Constitutional: Negative for appetite change, chills, fatigue and fever.  Respiratory: Negative for cough, chest tightness, shortness of breath and wheezing.   Cardiovascular: Negative for chest pain, palpitations and leg swelling.  Gastrointestinal: Negative for  abdominal distention, abdominal pain, constipation, diarrhea, nausea and vomiting.  Genitourinary: Positive for frequency. Negative for difficulty urinating, dysuria, flank pain and urgency.  Musculoskeletal: Positive for arthralgias and back pain.  Skin: Negative for color change, pallor and rash.  Neurological: Negative for weakness and headaches.       Dizziness sometimes   Psychiatric/Behavioral: Negative for agitation and sleep disturbance. The patient is nervous/anxious.        Anxiety since 1 yrs    Immunization History  Administered Date(s) Administered  . Influenza, High Dose Seasonal PF 01/16/2018, 12/08/2018  . Influenza-Unspecified 12/08/2013, 01/10/2015, 12/09/2015, 01/12/2017  . Pneumococcal Conjugate-13 04/09/2012  . Pneumococcal Polysaccharide-23 02/28/2016  . Tdap 01/07/2013  . Zoster 08/05/2014   Pertinent  Health Maintenance Due  Topic Date Due  . MAMMOGRAM  03/11/2019  . INFLUENZA VACCINE  Completed  .  DEXA SCAN  Completed  . PNA vac Low Risk Adult  Completed   Fall Risk  12/16/2018 11/24/2018 11/14/2018 07/14/2018 04/14/2018  Falls in the past year? 0 0 0 0 1  Number falls in past yr: 0 0 - 0 0  Injury with Fall? 0 0 0 0 0  Comment - - - - -    Vitals:   12/16/18 1539  BP: 134/84  Pulse: 67  Temp: 97.8 F (36.6 C)  TempSrc: Oral  SpO2: 97%  Weight: 117 lb (53.1 kg)  Height: 4\' 10"  (1.473 m)   Body mass index is 24.45 kg/m. Physical Exam Constitutional:      General: She is not in acute distress.    Appearance: She is normal weight. She is not ill-appearing.  HENT:     Mouth/Throat:     Mouth: Mucous membranes are moist.     Pharynx: Oropharynx is clear. No oropharyngeal exudate or posterior oropharyngeal erythema.  Eyes:     General: No scleral icterus.       Right eye: No discharge.        Left eye: No discharge.     Conjunctiva/sclera: Conjunctivae normal.     Pupils: Pupils are equal, round, and reactive to light.  Cardiovascular:     Rate  and Rhythm: Normal rate and regular rhythm.     Pulses: Normal pulses.     Heart sounds: Normal heart sounds. No murmur. No friction rub. No gallop.   Pulmonary:     Effort: Pulmonary effort is normal. No respiratory distress.     Breath sounds: Normal breath sounds. No wheezing, rhonchi or rales.  Chest:     Chest wall: No tenderness.  Abdominal:     General: Bowel sounds are normal. There is no distension.     Palpations: Abdomen is soft. There is no mass.     Tenderness: There is no abdominal tenderness. There is no right CVA tenderness, left CVA tenderness, guarding or rebound.  Musculoskeletal: Normal range of motion.        General: No swelling or tenderness.     Right lower leg: No edema.     Left lower leg: No edema.  Skin:    General: Skin is warm and dry.     Coloration: Skin is not pale.     Findings: No bruising, erythema or rash.  Neurological:     Mental Status: She is alert and oriented to person, place, and time.     Cranial Nerves: No cranial nerve deficit.     Sensory: No sensory deficit.     Motor: No weakness.     Coordination: Coordination normal.     Deep Tendon Reflexes: Reflexes normal.  Psychiatric:        Mood and Affect: Mood normal.        Behavior: Behavior normal.        Thought Content: Thought content normal.        Judgment: Judgment normal.     Comments: Forgetful     Labs reviewed: Recent Labs    02/05/18 1004 07/11/18 0956 11/10/18 0904  NA 142 141 143  K 4.5 4.5 4.3  CL 106 104 105  CO2 30 30 29   GLUCOSE 88 93 96  BUN 30* 23 27*  CREATININE 0.96* 0.92* 0.87  CALCIUM 10.2 9.8 10.3   Recent Labs    02/05/18 1004 11/10/18 0904  AST 16 29  ALT 22 25  BILITOT 0.3 0.3  PROT 6.7 7.0   Recent Labs    02/05/18 1004 11/10/18 0904  WBC 9.2 7.4  NEUTROABS 6,256 4,847  HGB 13.1 12.9  HCT 40.5 39.2  MCV 86.5 85.4  PLT 117* 119*   Lab Results  Component Value Date   TSH 1.88 11/10/2018   No results found for: HGBA1C Lab  Results  Component Value Date   CHOL 182 11/10/2018   HDL 60 11/10/2018   LDLCALC 104 (H) 11/10/2018   TRIG 88 11/10/2018   CHOLHDL 3.0 11/10/2018    Significant Diagnostic Results in last 30 days:  Ct Abdomen Pelvis Wo Contrast  Result Date: 11/27/2018 CLINICAL DATA:  Left lower abdominal lump. Weight loss. History of left breast cancer. EXAM: CT ABDOMEN AND PELVIS WITHOUT CONTRAST TECHNIQUE: Multidetector CT imaging of the abdomen and pelvis was performed following the standard protocol without IV contrast. COMPARISON:  10/05/2015 FINDINGS: Lower chest: Pacer wires in the right heart and coronary sinus. Heart is normal size. Scarring in the lung bases. No acute abnormality. Hepatobiliary: Stable scattered hypodensities in the liver, likely small cysts. Gallbladder unremarkable. Pancreas: No focal abnormality or ductal dilatation. Spleen: No focal abnormality.  Normal size. Adrenals/Urinary Tract: No adrenal abnormality. No focal renal abnormality. No stones or hydronephrosis. Urinary bladder is unremarkable. Stomach/Bowel: Normal appendix. Stomach, large and small bowel grossly unremarkable. Vascular/Lymphatic: Aortic atherosclerosis. No enlarged abdominal or pelvic lymph nodes. Reproductive: Uterus and adnexa unremarkable.  No mass. Other: No free fluid or free air. 3.4 cm thick walled low-density area within the left breast laterally is stable since prior study. This likely reflects postoperative scar/seroma. Musculoskeletal: Postoperative changes in the lower lumbar spine. Degenerative changes. IMPRESSION: No acute findings. No visible soft tissue abnormality to explain palpable lump in left lower abdomen. Electronically Signed   By: Rolm Baptise M.D.   On: 11/27/2018 14:11    Assessment/Plan   Urinary frequency Afebrile.Negative exam findings. - POC Urinalysis Dipstick results shows clear yellow urine negative for blood,Nitrites,leukocytes and no odor noted.Encouraged to drink fluid  throughout the day but stop by 6 pm to avoid frequent getting up at night.verbalized understanding.   Family/ staff Communication: Reviewed plan of care with patient.  Labs/tests ordered: - POC Urinalysis Dipstick   Sandrea Hughs, NP

## 2018-12-24 IMAGING — DX DG ABDOMEN 1V
1 series · 1 of 1 positions shown · non-contrast
Comparison: CT abdomen and pelvis October 05, 2015

CLINICAL DATA: Abdominal pain

EXAM:
ABDOMEN - 1 VIEW

[abdomen kub]
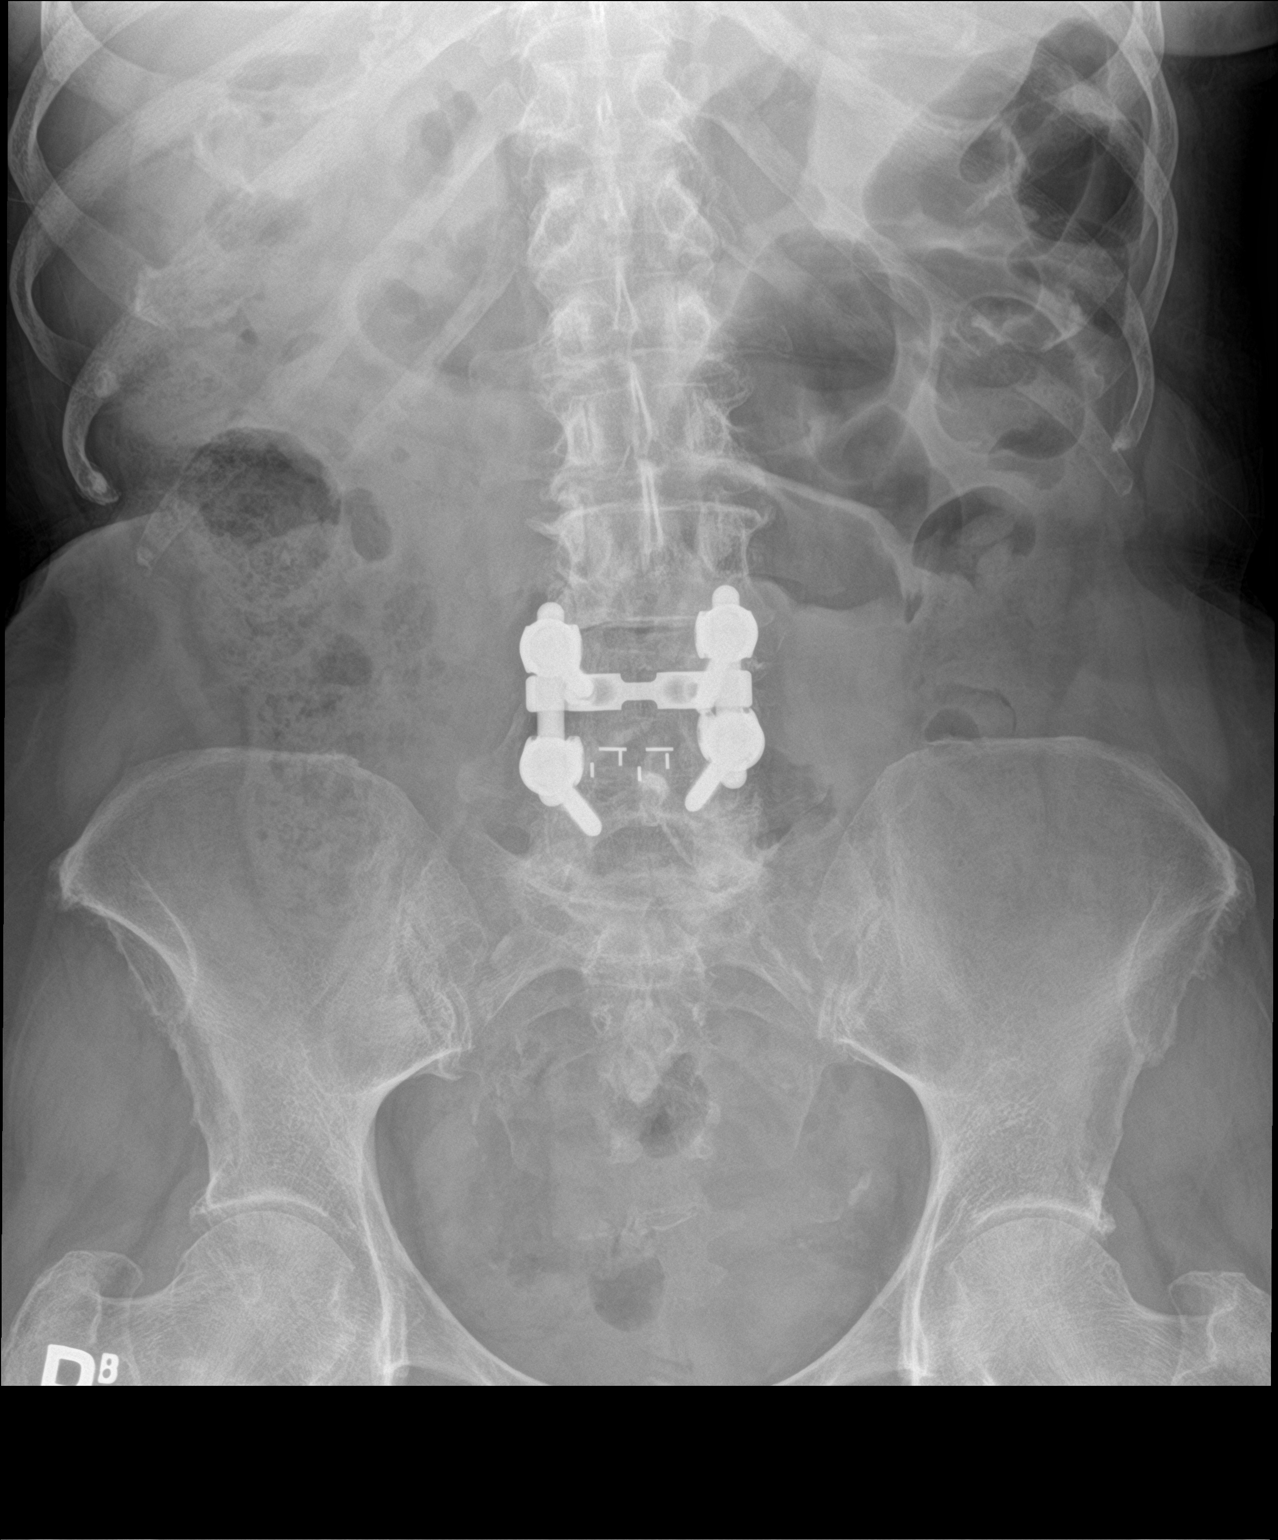

[1 of 1 positions shown; findings below may reference images not displayed]

FINDINGS: There is moderate stool in the colon. There is no bowel dilatation
or air-fluid level to suggest bowel obstruction. No free air. There
is postoperative change in the lower lumbar spine.
IMPRESSION: No bowel obstruction or free air.  Moderate stool in colon.

## 2019-01-03 ENCOUNTER — Other Ambulatory Visit: Payer: Self-pay | Admitting: Nurse Practitioner

## 2019-01-15 ENCOUNTER — Other Ambulatory Visit: Payer: Self-pay

## 2019-01-15 ENCOUNTER — Encounter: Payer: Self-pay | Admitting: Adult Health

## 2019-01-15 ENCOUNTER — Ambulatory Visit: Payer: Medicare Other | Admitting: Nurse Practitioner

## 2019-01-15 ENCOUNTER — Ambulatory Visit (INDEPENDENT_AMBULATORY_CARE_PROVIDER_SITE_OTHER): Payer: Medicare Other | Admitting: Adult Health

## 2019-01-15 VITALS — BP 128/72 | HR 70 | Temp 97.3°F | Resp 18 | Ht <= 58 in | Wt 115.8 lb

## 2019-01-15 DIAGNOSIS — I5022 Chronic systolic (congestive) heart failure: Secondary | ICD-10-CM | POA: Diagnosis not present

## 2019-01-15 DIAGNOSIS — I10 Essential (primary) hypertension: Secondary | ICD-10-CM | POA: Diagnosis not present

## 2019-01-15 DIAGNOSIS — B370 Candidal stomatitis: Secondary | ICD-10-CM

## 2019-01-15 DIAGNOSIS — F3341 Major depressive disorder, recurrent, in partial remission: Secondary | ICD-10-CM

## 2019-01-15 MED ORDER — NYSTATIN 100000 UNIT/ML MT SUSP: 5.0000 mL | Freq: Four times a day (QID) | OROMUCOSAL | 0 refills | Status: AC

## 2019-01-15 NOTE — Patient Instructions (Addendum)
Candida infection, oral - brush tongue gently at least twice a day, - nystatin (MYCOSTATIN) 100000 UNIT/ML suspension; Use as directed 5 mLs (500,000 Units total) in the mouth or throat 4 (four) times daily for 14 days. Swish and spit  Dispense: 60 mL; Refill: 0  - Eat regularly, healthy balanced food. Plan meals for the week.  - Take medications as ordered.  - Call and follow up as needed if oral candida persists after 2 weeks.

## 2019-01-15 NOTE — Progress Notes (Signed)
[facility]  Provider:  Alysia Scism Medina-Vargas - DNP, FNP-BC, MSN  Code Status:  DNR  Goals of Care:  Advanced Directives 07/14/2018  Does Patient Have a Medical Advance Directive? Yes  Type of Paramedic of [place];Living will  Does patient want to make changes to medical advance directive? -  Copy of [place] in Chart? No - copy requested  Pre-existing out of facility DNR order (yellow form or pink MOST form) -     Chief Complaint  Patient presents with  . Medical Management of Chronic Issues    2 mo f/u for weight    HPI: Patient is a 85 y.o. female seen today for an acute visit for follow up of weights. Latest weight is 115.8  lbs 12.8 oz Body mass index is 24.2 kg/m. She had weight loss of 1.2 lbs in a month. She is currently taking Lasix and Spironolactone for chronic systolic heart failure. No SOB has been noted. She reported that 3 years ago she weighed 140 lbs. She lives with her husband at home. She said that most of the time, she has no plans for lunch. She eats cereal for breakfast and eats whatever left-over food is available for lunch. Her husband usually makes his own sandwich for lunch. She cooks dinner.  Noted that she has whitish coating on her tongue. She said that when she wakes up in the morning, she has whitish coating on her tongue. She denies having oral pain. She reports brushing her tongue when brushing her teeth.   Past Medical History:  Diagnosis Date  . Arthritis   . Cancer [facility])    left breast cancer   . CHF (congestive heart failure) ([place])    PACEMAKER & DEFIB  . Complication of anesthesia    hypotensive after back surgery in 2006- reports on chart   . Depression   . Dyslipidemia   . Fainted 04/21/06   AT [place]  . GERD (gastroesophageal reflux disease)   . Headache(784.0)   . Hearing loss   . HLD (hyperlipidemia)   . Hypertension   . Hypothyroidism   . ICD (implantable cardiac defibrillator) in  place    pt has pacer/icd  . ICD (implantable cardiac defibrillator), biventricular, in situ   . LBBB (left bundle branch block)   . Memory loss   . Nonischemic cardiomyopathy ([place])   . Normal coronary arteries    s/p cardiac cath 2007  . Pacemaker    Guidant Device  . Syncope   . Systolic CHF ([place])   . Vertigo   . Wears glasses     Past Surgical History:  Procedure Laterality Date  . BACK SURGERY     lumbar fusion   . BREAST SURGERY  2000   LUMP REMOVAL. STAGE 1 CANCER  . CARDIAC CATHETERIZATION    . CATARACT EXTRACTION    . EYE SURGERY    . IMPLANTABLE CARDIOVERTER DEFIBRILLATOR GENERATOR CHANGE N/A 12/18/2012   Procedure: IMPLANTABLE CARDIOVERTER DEFIBRILLATOR GENERATOR CHANGE;  Surgeon: [name], MD;  Location: [facility] CATH LAB;  Service: Cardiovascular;  Laterality: N/A;  . JOINT REPLACEMENT  06/14/01   right  . LUMBAR FUSION  2006  . MASS EXCISION  11/08/2011   Procedure: EXCISION MASS;  Surgeon: [name], MD;  Location: [facility];  Service: General;  Laterality: Left;  Excision Left Thigh Mass  . MASTECTOMY, PARTIAL  2008   GOT PACEMAKER AND DEFIB AT THAT TIME  . PACEMAKER INSERTION  04/23/06  . TOTAL KNEE ARTHROPLASTY  05/17/01   RIGHT KNEE  . TOTAL KNEE ARTHROPLASTY Left 11/29/2014   Procedure: TOTAL LEFT KNEE ARTHROPLASTY;  Surgeon: Paralee Cancel, MD;  Location: [facility];  Service: Orthopedics;  Laterality: Left;    Allergies  Allergen Reactions  . Iodine Shortness Of Breath    Iodine contrast, CHF , SOB  . Shellfish Allergy Shortness Of Breath  . Memantine     Malaise, fogginess/ couldn't think  . Aspirin Nausea And Vomiting  . Codeine Nausea And Vomiting    Outpatient Encounter Medications as of 01/15/2019  Medication Sig  . Baclofen 5 MG TABS Take 5 mg by mouth 2 (two) times daily as needed.  Marland Kitchen buPROPion (WELLBUTRIN XL) 150 MG 24 hr tablet Take 3 tablets (450 mg total) by mouth daily.  . carvedilol (COREG) 3.125 MG tablet Take 1 tablet by mouth 2 (two) times  daily.  . cholecalciferol (VITAMIN D) 1000 UNITS tablet Take 1,000 Units by mouth daily.  . furosemide (LASIX) 20 MG tablet Take 1 tablet (20 mg total) by mouth daily.  Marland Kitchen levothyroxine (SYNTHROID, LEVOTHROID) 100 MCG tablet Take 1 tablet (100 mcg total) by mouth daily before breakfast.  . Magnesium 250 MG TABS Take 250 mg by mouth daily.  . NONFORMULARY OR COMPOUNDED ITEM Diclofenac-Baclofen 2% [id number] 1-2 grams topically four times daily. Before application of cream rub 2 minutes to active max penetration. Quantity 120 grams  . Omega-3 Fatty Acids (FISH OIL) 1200 MG CAPS Take 1,200 mg by mouth every morning.  Marland Kitchen omeprazole (PRILOSEC) 40 MG capsule TAKE ONE CAPSULE BY MOUTH  ONCE DAILY FOR STOMACH  . Oxcarbazepine (TRILEPTAL) 300 MG tablet Take 300 mg by mouth every morning.   . potassium chloride (K-DUR) 10 MEQ tablet TAKE 1 TABLET BY MOUTH  DAILY  . sertraline (ZOLOFT) 100 MG tablet Take 200 mg by mouth daily.   Marland Kitchen spironolactone (ALDACTONE) 25 MG tablet TAKE 1 TABLET BY MOUTH IN  THE MORNING  . traMADol (ULTRAM) 50 MG tablet Take 1 tablet by mouth as needed.  . vitamin E 1000 UNIT capsule Take 1,000 Units by mouth daily.  . [DISCONTINUED] Calcium Carbonate (CALCIUM 500 PO) Take 500 mg by mouth daily.    No facility-administered encounter medications on file as of 01/15/2019.     Review of Systems:  Review of Systems  Constitutional: Positive for unexpected weight change. Negative for appetite change.  Respiratory: Negative for cough and shortness of breath.   Cardiovascular: Negative.   Gastrointestinal: Negative for abdominal pain, constipation and vomiting.  Endocrine: Negative.   Genitourinary: Negative.   Skin: Negative.     Health Maintenance  Topic Date Due  . MAMMOGRAM  03/11/2019  . TETANUS/TDAP  01/08/2023  . INFLUENZA VACCINE  Completed  . DEXA SCAN  Completed  . PNA vac Low Risk Adult  Completed    Physical Exam: Vitals:   01/15/19 1000  BP: 128/72  Pulse: 70   Resp: 18  Temp: (!) 97.3 F (36.3 C)  SpO2: 97%  Weight: 115 lb 12.8 oz (52.5 kg)  Height: 4\' 10"  (1.473 m)   Body mass index is 24.2 kg/m. Physical Exam Constitutional:      Appearance: Normal appearance. She is normal weight.  HENT:     Head: Normocephalic.     Mouth/Throat:     Mouth: Mucous membranes are moist.     Comments: Tongue with whitish coating Eyes:     Conjunctiva/sclera: Conjunctivae normal.  Neck:  Musculoskeletal: Normal range of motion. No muscular tenderness.  Cardiovascular:     Rate and Rhythm: Normal rate and regular rhythm.     Pulses: Normal pulses.     Heart sounds: Normal heart sounds.  Pulmonary:     Effort: Pulmonary effort is normal.     Breath sounds: Normal breath sounds.  Abdominal:     General: Bowel sounds are normal.     Palpations: Abdomen is soft.  Neurological:     Mental Status: She is alert.     Labs reviewed: Basic Metabolic Panel: Recent Labs    02/05/18 1004 07/11/18 0956 11/10/18 0904  NA 142 141 143  K 4.5 4.5 4.3  CL 106 104 105  CO2 30 30 29   GLUCOSE 88 93 96  BUN 30* 23 27*  CREATININE 0.96* 0.92* 0.87  CALCIUM 10.2 9.8 10.3  TSH 1.56  --  1.88   Liver Function Tests: Recent Labs    02/05/18 1004 11/10/18 0904  AST 16 29  ALT 22 25  BILITOT 0.3 0.3  PROT 6.7 7.0   CBC: Recent Labs    02/05/18 1004 11/10/18 0904  WBC 9.2 7.4  NEUTROABS 6,256 4,847  HGB 13.1 12.9  HCT 40.5 39.2  MCV 86.5 85.4  PLT 117* 119*   Lipid Panel: Recent Labs    02/05/18 1004 11/10/18 0904  CHOL 175 182  HDL 54 60  LDLCALC 100* 104*  TRIG 110 88  CHOLHDL 3.2 3.0    Assessment/Plan  1. Candida infection, oral - nystatin (MYCOSTATIN) 100000 UNIT/ML suspension; Use as directed 5 mLs (500,000 Units total) in the mouth or throat 4 (four) times daily for 14 days. Swish and spit  Dispense: 60 mL; Refill: 0  2. Chronic systolic heart failure (HCC) - no SOB, continue Lasix, Aldactone, Coreg  3. Benign  Hypertension - BP 134/84, continue Benicar and Coreg  4. Depression  - continue Sertraline and Bupropion   Labs/tests ordered:  None  Next appt:  04/15/2019

## 2019-01-20 NOTE — Progress Notes (Signed)
PATIENT: [name] DOB: [birth year]  REASON FOR VISIT: follow up HISTORY FROM: patient  HISTORY OF PRESENT ILLNESS: Today 01/21/19  HISTORY  HISTORY OF PRESENT ILLNESS:HISTORY [name] is a 85 yo RH AAF referred by her primary care Dr. Baird Cancer for evaluation of memory trouble, drive herself to office alone at today's clinical visit.  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 Nicholes Calamity under general anesthesia, woke up from surgery, she noticed mild memory trouble, she has short-term memory trouble, has been persistent since then, she denies difficulty talking, no strokelike symptoms then. She lives at home with her family, highly functional, driving, independent at daily activity, able to keep her check in balance, She suffered long-standing history of bipolar disorder, on polypharmacy treatment, this including Trileptal 300 mg daily, Zoloft 100 mg a day, Wellbutrin 150 mg 3 tablets a day, She had accident of sudden onset dizziness couple days ago, after dinner, she felt lightheaded, has to crawling upstairs, was helped by her husband to get up, then she fell to the ground, whole-body shaking, no loss of consciousness. She has baseline mild gait difficulty due to her low back pain, bilateral knee pain, She denied a family history of dementia, CT head in 2013, Unchanged mild atrophy and microvascular ischemic disease without acute intracranial process. She had a history of chronic systolic heart failure, left bundle branch block, status post biventricular ICD insertion in 2009, underwent device generator change out Sept 2014. S She presents for one year evaluation. She reports feeling dizzy Tuesday night with improvement by Wednesday morning. She also reports upper body "shaking". The only pain she has is from  arthritis. She gets some mild edema in her right ankle. She reports her blood pressure at home usually runs 110-120/60-70. Sometimes when she gets up quickly, she felt lightheaded.  UPDATE April 8th 2016: She is overall doing very well, only has occasionally dizziness, especially when she gets up quickly, today's Mini-Mental status examination is 29 out of 30 CAT scan of the brain showed mild small vessel disease, no acute lesions, EEG showed mild slowing  UPDATE July 16 2016:YY She drive here herself, she continue complains of worsening memory loss, she lives with her husband in their house of more than 31 years old, she is independent in her daily activity, exercise regularly, Following senioremexercise program on TV, she has mild low back pain, cause mild gait difficulty sometimes, radiating pain to bilateral lower extremity, she has no bowel and bladder incontinence.  Update 07/31/2018 YY: She complains of worsening gait abnormalities and memory loss, she lives with her husband, she still drives, stumble a lot,  Previously, she has tried aricept and namenda, could not tolerate the side effect.I have reviewed and agreed above plan.  Update January 21, 2019 SS: She is here today alone.  Her husband suffered a stroke a few days ago, is in the hospital.  She is living alone for the time being, he will have to go to rehab.  She continues to drive a car.  She denies any trouble getting lost or with directions.  She is able to perform her own ADLs and manage her medications.  She indicates she does have some gait instability due to back pain.  She has not had any falls.  Since last seen, she indicates some memory decline,  mostly trouble concentrating.  She has 1 son who lives in [place].  She reports she has neighbors and friends who are nearby to offer support.  She says her overall health has been good.  She does have underlying bipolar disorder, is managed by her psychiatrist, Dr Reece Levy.  She  reports she does a daily senior TV exercise program.  She has been unable to tolerate Aricept, Exelon, or Namenda due to side effect.  REVIEW OF SYSTEMS: Out of a complete 14 system review of symptoms, the patient complains only of the following symptoms, and all other reviewed systems are negative.  Memory loss, weakness, tremors  ALLERGIES: Allergies  Allergen Reactions  . Iodine Shortness Of Breath    Iodine contrast, CHF , SOB  . Shellfish Allergy Shortness Of Breath  . Memantine     Malaise, fogginess/ couldn't think  . Aspirin Nausea And Vomiting  . Codeine Nausea And Vomiting    HOME MEDICATIONS: Outpatient Medications Prior to Visit  Medication Sig Dispense Refill  . Baclofen 5 MG TABS Take 5 mg by mouth 2 (two) times daily as needed. 10 tablet 0  . buPROPion (WELLBUTRIN XL) 150 MG 24 hr tablet Take 3 tablets (450 mg total) by mouth daily. 270 tablet 3  . carvedilol (COREG) 3.125 MG tablet Take 1 tablet by mouth 2 (two) times daily.    . cholecalciferol (VITAMIN D) 1000 UNITS tablet Take 1,000 Units by mouth daily.    . furosemide (LASIX) 20 MG tablet Take 1 tablet (20 mg total) by mouth daily. 90 tablet 1  . levothyroxine (SYNTHROID, LEVOTHROID) 100 MCG tablet Take 1 tablet (100 mcg total) by mouth daily before breakfast. 90 tablet 3  . Magnesium 250 MG TABS Take 250 mg by mouth daily.    . NONFORMULARY OR COMPOUNDED ITEM Diclofenac-Baclofen 2% [id number] 1-2 grams topically four times daily. Before application of cream rub 2 minutes to active max penetration. Quantity 120 grams 120 each 2  . nystatin (MYCOSTATIN) 100000 UNIT/ML suspension Use as directed 5 mLs (500,000 Units total) in the mouth or throat 4 (four) times daily for 14 days. Swish and spit 60 mL 0  . Omega-3 Fatty Acids (FISH OIL) 1200 MG CAPS Take 1,200 mg by mouth every morning.    Marland Kitchen omeprazole (PRILOSEC) 40 MG capsule TAKE ONE CAPSULE BY MOUTH  ONCE DAILY FOR STOMACH 90 capsule 1  . Oxcarbazepine (TRILEPTAL)  300 MG tablet Take 300 mg by mouth every morning.     . potassium chloride (K-DUR) 10 MEQ tablet TAKE 1 TABLET BY MOUTH  DAILY 90 tablet 1  . sertraline (ZOLOFT) 100 MG tablet Take 200 mg by mouth daily.     Marland Kitchen spironolactone (ALDACTONE) 25 MG tablet TAKE 1 TABLET BY MOUTH IN  THE MORNING 90 tablet 1  . traMADol (ULTRAM) 50 MG tablet Take 1 tablet by mouth as needed.    . vitamin E 1000 UNIT capsule Take 1,000 Units by mouth daily.     No facility-administered medications prior to visit.     PAST MEDICAL HISTORY: Past Medical History:  Diagnosis Date  . Arthritis   . Cancer [facility])    left breast cancer   . CHF (congestive heart failure) ([place])    PACEMAKER & DEFIB  . Complication of anesthesia    hypotensive after back surgery in 2006- reports on chart   . Depression   . Dyslipidemia   . Fainted 04/21/06   AT [place]  . GERD (gastroesophageal reflux disease)   .  Headache(784.0)   . Hearing loss   . HLD (hyperlipidemia)   . Hypertension   . Hypothyroidism   . ICD (implantable cardiac defibrillator) in place    pt has pacer/icd  . ICD (implantable cardiac defibrillator), biventricular, in situ   . LBBB (left bundle branch block)   . Memory loss   . Nonischemic cardiomyopathy ([place])   . Normal coronary arteries    s/p cardiac cath 2007  . Pacemaker    Guidant Device  . Syncope   . Systolic CHF ([place])   . Vertigo   . Wears glasses     PAST SURGICAL HISTORY: Past Surgical History:  Procedure Laterality Date  . BACK SURGERY     lumbar fusion   . BREAST SURGERY  2000   LUMP REMOVAL. STAGE 1 CANCER  . CARDIAC CATHETERIZATION    . CATARACT EXTRACTION    . EYE SURGERY    . IMPLANTABLE CARDIOVERTER DEFIBRILLATOR GENERATOR CHANGE N/A 12/18/2012   Procedure: IMPLANTABLE CARDIOVERTER DEFIBRILLATOR GENERATOR CHANGE;  Surgeon: [name], MD;  Location: [facility] CATH LAB;  Service: Cardiovascular;  Laterality: N/A;  . JOINT REPLACEMENT  06/14/01   right  . LUMBAR FUSION  2006  . MASS  EXCISION  11/08/2011   Procedure: EXCISION MASS;  Surgeon: [name], MD;  Location: [facility] ORS;  Service: General;  Laterality: Left;  Excision Left Thigh Mass  . MASTECTOMY, PARTIAL  2008   GOT PACEMAKER AND DEFIB AT THAT TIME  . PACEMAKER INSERTION  04/23/06  . TOTAL KNEE ARTHROPLASTY  05/17/01   RIGHT KNEE  . TOTAL KNEE ARTHROPLASTY Left 11/29/2014   Procedure: TOTAL LEFT KNEE ARTHROPLASTY;  Surgeon: Paralee Cancel, MD;  Location: [facility] ORS;  Service: Orthopedics;  Laterality: Left;    FAMILY HISTORY: Family History  Problem Relation Age of Onset  . Hypertension Mother   . Arthritis Mother   . Hypertension Father   . Hypertension Brother   . Hypertension Brother     SOCIAL HISTORY: Social History   Socioeconomic History  . Marital status: Married    Spouse name: Not on file  . Number of children: 1  . Years of education: Masters  . Highest education level: Not on file  Occupational History  . Occupation: Retired  Scientific laboratory technician  . Financial resource strain: Not hard at all  . Food insecurity    Worry: Never true    Inability: Never true  . Transportation needs    Medical: No    Non-medical: No  Tobacco Use  . Smoking status: Never Smoker  . Smokeless tobacco: Never Used  Substance and Sexual Activity  . Alcohol use: No  . Drug use: No  . Sexual activity: Not Currently    Birth control/protection: Post-menopausal  Lifestyle  . Physical activity    Days per week: 4 days    Minutes per session: 30 min  . Stress: To some extent  Relationships  . Social connections    Talks on phone: More than three times a week    Gets together: Once a week    Attends religious service: More than 4 times per year    Active member of club or organization: Yes    Attends meetings of clubs or organizations: Never    Relationship status: Married  . Intimate partner violence    Fear of current or ex partner: No    Emotionally abused: No    Physically abused: No    Forced sexual activity:  No  Other Topics Concern  . Not on file  Social History Narrative   Lives at home with husband.   Right-handed.      As of 07/28/2014   Diet: No special diet   Caffeine: yes, Chocolate, tea and sodas    Married: YES, 1970   House: Yes, 2 stories, 2-3 persons live in home   Pets: No   Current/Past profession: Engineer, mining, Designer, jewellery    Exercise: Yes 2-3 x weekly   Living Will: Yes   DNR: No   POA/HPOA: No       PHYSICAL EXAM  Vitals:   01/21/19 1026  BP: 122/78  Pulse: 76  Temp: 97.7 F (36.5 C)  TempSrc: Oral  Weight: 117 lb 6.4 oz (53.3 kg)  Height: 4\' 10"  (1.473 m)   Body mass index is 24.54 kg/m.  Generalized: Well developed, in no acute distress  MMSE - Mini Mental State Exam 01/21/2019 01/29/2018 07/24/2017  Not completed: (No Data) (No Data) -  Orientation to time 5 5 4   Orientation to Place 4 5 5   Registration 3 3 3   Attention/ Calculation 1 5 2   Recall 3 1 2   Language- name 2 objects 1 2 2   Language- repeat 1 1 1   Language- follow 3 step command 2 3 3   Language- follow 3 step command-comments she didnt fold the paper - -  Language- read & follow direction 1 1 1   Write a sentence 1 1 1   Copy design 1 1 1   Total score 23 28 25     Neurological examination  Mentation: Alert oriented to time, place, history taking. Follows all commands speech and language fluent Cranial nerve II-XII: Pupils were equal round reactive to light. Extraocular movements were full, visual field were full on confrontational test. Facial sensation and strength were normal.  Head turning and shoulder shrug were normal and symmetric. Motor: The motor testing reveals 5 over 5 strength of all 4 extremities. Good symmetric motor tone is noted throughout.  Sensory: Sensory testing is intact to soft touch on all 4 extremities. No evidence of extinction is noted.  Coordination: Cerebellar testing reveals good finger-nose-finger and heel-to-shin bilaterally.  Gait and station:  Gait is slightly wide-based, otherwise normal. Reflexes: Deep tendon reflexes are symmetric and normal bilaterally.   DIAGNOSTIC DATA (LABS, IMAGING, TESTING) - I reviewed patient records, labs, notes, testing and imaging myself where available.  Lab Results  Component Value Date   WBC 7.4 11/10/2018   HGB 12.9 11/10/2018   HCT 39.2 11/10/2018   MCV 85.4 11/10/2018   PLT 119 (L) 11/10/2018      Component Value Date/Time   NA 143 11/10/2018 0904   NA 139 10/05/2015 0944   K 4.3 11/10/2018 0904   CL 105 11/10/2018 0904   CO2 29 11/10/2018 0904   GLUCOSE 96 11/10/2018 0904   BUN 27 (H) 11/10/2018 0904   BUN 25 10/05/2015 0944   CREATININE 0.87 11/10/2018 0904   CALCIUM 10.3 11/10/2018 0904   PROT 7.0 11/10/2018 0904   PROT 6.4 10/05/2015 0944   ALBUMIN 4.1 12/07/2016 1215   ALBUMIN 4.3 10/05/2015 0944   AST 29 11/10/2018 0904   ALT 25 11/10/2018 0904   ALKPHOS 66 12/07/2016 1215   BILITOT 0.3 11/10/2018 0904   BILITOT <0.2 10/05/2015 0944   GFRNONAA 62 11/10/2018 0904   GFRAA 72 11/10/2018 0904   Lab Results  Component Value Date   CHOL 182 11/10/2018   HDL 60 11/10/2018  LDLCALC 104 (H) 11/10/2018   TRIG 88 11/10/2018   CHOLHDL 3.0 11/10/2018   No results found for: HGBA1C Lab Results  Component Value Date   VITAMINB12 1,117 (H) 07/28/2014   Lab Results  Component Value Date   TSH 1.88 11/10/2018    ASSESSMENT AND PLAN 85 y.o. year old female  has a past medical history of Arthritis, Cancer ([place]), CHF (congestive heart failure) ([place]), Complication of anesthesia, Depression, Dyslipidemia, Fainted (04/21/06), GERD (gastroesophageal reflux disease), Headache(784.0), Hearing loss, HLD (hyperlipidemia), Hypertension, Hypothyroidism, ICD (implantable cardiac defibrillator) in place, ICD (implantable cardiac defibrillator), biventricular, in situ, LBBB (left bundle branch block), Memory loss, Nonischemic cardiomyopathy ([place]), Normal coronary arteries, Pacemaker,  Syncope, Systolic CHF ([place]), Vertigo, and Wears glasses. here with:  1.  Mild cognitive impairment -Slight decline in MMSE 23/30, was 28/30 1 year ago, under recent stress, her husband suffered a stroke a few days ago -She has been unable to tolerate Aricept, Exelon, or Namenda due to side effect -Continue exercise program, continue brain stimulating activities (reading, puzzles, games) -We discussed monitoring her driving over time for safety, so far, no accidents or getting lost episodes  -Return in 6 months or sooner if needed  I spent 15 minutes with the patient. 50% of this time was spent discussing her plan of care.  Butler Denmark, AGNP-C, DNP 01/21/2019, 10:38 AM [facility] [address] [place], [place] [address] [phone]

## 2019-01-21 ENCOUNTER — Ambulatory Visit (INDEPENDENT_AMBULATORY_CARE_PROVIDER_SITE_OTHER): Payer: Medicare Other | Admitting: Neurology

## 2019-01-21 ENCOUNTER — Encounter: Payer: Self-pay | Admitting: Neurology

## 2019-01-21 ENCOUNTER — Other Ambulatory Visit: Payer: Self-pay

## 2019-01-21 VITALS — BP 122/78 | HR 76 | Temp 97.7°F | Ht <= 58 in | Wt 117.4 lb

## 2019-01-21 DIAGNOSIS — R413 Other amnesia: Secondary | ICD-10-CM | POA: Diagnosis not present

## 2019-01-21 NOTE — Progress Notes (Signed)
I have reviewed and agreed above plan. 

## 2019-01-21 NOTE — Patient Instructions (Addendum)
1. Continue exercise, continue brain stimulating activity  2. We need to closely monitor driving overtime for safety 3. Memory score was 23/30, last visit was 28/30 4. Return in 6 months

## 2019-02-05 ENCOUNTER — Ambulatory Visit (INDEPENDENT_AMBULATORY_CARE_PROVIDER_SITE_OTHER): Payer: Medicare Other | Admitting: *Deleted

## 2019-02-05 DIAGNOSIS — I639 Cerebral infarction, unspecified: Secondary | ICD-10-CM

## 2019-02-05 DIAGNOSIS — I5022 Chronic systolic (congestive) heart failure: Secondary | ICD-10-CM

## 2019-02-05 LAB — CUP PACEART REMOTE DEVICE CHECK
Battery Remaining Longevity: 54 mo
Battery Remaining Percentage: 81 %
Brady Statistic RA Percent Paced: 0 %
Brady Statistic RV Percent Paced: 100 %
Date Time Interrogation Session: 20201029124800
HighPow Impedance: 53 Ohm
Implantable Lead Implant Date: 20080116
Implantable Lead Implant Date: 20080116
Implantable Lead Implant Date: 20080116
Implantable Lead Location: 753859
Implantable Lead Location: 753860
Implantable Lead Location: 753860
Implantable Lead Model: 157
Implantable Lead Model: 4469
Implantable Lead Model: 4555
Implantable Lead Serial Number: 136532
Implantable Lead Serial Number: 161542
Implantable Lead Serial Number: 473495
Implantable Pulse Generator Implant Date: 20140911
Lead Channel Impedance Value: 417 Ohm
Lead Channel Impedance Value: 571 Ohm
Lead Channel Impedance Value: 852 Ohm
Lead Channel Pacing Threshold Amplitude: 0.6 V
Lead Channel Pacing Threshold Amplitude: 0.7 V
Lead Channel Pacing Threshold Amplitude: 0.8 V
Lead Channel Pacing Threshold Pulse Width: 0.4 ms
Lead Channel Pacing Threshold Pulse Width: 0.4 ms
Lead Channel Pacing Threshold Pulse Width: 0.8 ms
Lead Channel Setting Pacing Amplitude: 2 V
Lead Channel Setting Pacing Amplitude: 2 V
Lead Channel Setting Pacing Amplitude: 2.4 V
Lead Channel Setting Pacing Pulse Width: 0.4 ms
Lead Channel Setting Pacing Pulse Width: 0.8 ms
Lead Channel Setting Sensing Sensitivity: 0.6 mV
Lead Channel Setting Sensing Sensitivity: 1 mV
Pulse Gen Serial Number: 111765

## 2019-02-27 NOTE — Progress Notes (Signed)
Remote ICD transmission.   

## 2019-03-16 ENCOUNTER — Encounter: Payer: Self-pay | Admitting: Nurse Practitioner

## 2019-03-17 ENCOUNTER — Encounter: Payer: Self-pay | Admitting: *Deleted

## 2019-03-18 ENCOUNTER — Telehealth: Payer: Self-pay

## 2019-03-18 NOTE — Telephone Encounter (Signed)
Patient called to verify her up coming appointments she had some confusion about why she had so many I explained to her one was for AWV, the other was her Yearly visit and one was for her Prolia shot for her bones she said she understood

## 2019-03-25 ENCOUNTER — Other Ambulatory Visit: Payer: Self-pay | Admitting: Internal Medicine

## 2019-04-15 ENCOUNTER — Ambulatory Visit (INDEPENDENT_AMBULATORY_CARE_PROVIDER_SITE_OTHER): Payer: Medicare Other | Admitting: Nurse Practitioner

## 2019-04-15 ENCOUNTER — Encounter: Payer: Self-pay | Admitting: Nurse Practitioner

## 2019-04-15 ENCOUNTER — Telehealth: Payer: Self-pay

## 2019-04-15 ENCOUNTER — Other Ambulatory Visit: Payer: Self-pay

## 2019-04-15 VITALS — BP 132/84 | HR 72 | Temp 97.3°F | Ht 59.03 in | Wt 121.0 lb

## 2019-04-15 DIAGNOSIS — Z0000 Encounter for general adult medical examination without abnormal findings: Secondary | ICD-10-CM

## 2019-04-15 DIAGNOSIS — Z66 Do not resuscitate: Secondary | ICD-10-CM | POA: Diagnosis not present

## 2019-04-15 DIAGNOSIS — I5022 Chronic systolic (congestive) heart failure: Secondary | ICD-10-CM

## 2019-04-15 DIAGNOSIS — G3184 Mild cognitive impairment, so stated: Secondary | ICD-10-CM

## 2019-04-15 DIAGNOSIS — E2839 Other primary ovarian failure: Secondary | ICD-10-CM

## 2019-04-15 DIAGNOSIS — M810 Age-related osteoporosis without current pathological fracture: Secondary | ICD-10-CM

## 2019-04-15 DIAGNOSIS — I10 Essential (primary) hypertension: Secondary | ICD-10-CM

## 2019-04-15 DIAGNOSIS — R2689 Other abnormalities of gait and mobility: Secondary | ICD-10-CM | POA: Diagnosis not present

## 2019-04-15 DIAGNOSIS — N6320 Unspecified lump in the left breast, unspecified quadrant: Secondary | ICD-10-CM

## 2019-04-15 DIAGNOSIS — R634 Abnormal weight loss: Secondary | ICD-10-CM

## 2019-04-15 DIAGNOSIS — D692 Other nonthrombocytopenic purpura: Secondary | ICD-10-CM

## 2019-04-15 DIAGNOSIS — E034 Atrophy of thyroid (acquired): Secondary | ICD-10-CM

## 2019-04-15 NOTE — Patient Instructions (Signed)
  Please call Breast Center to schedule your imagining  [facility] Address: [address], [place], [place] [address] Phone: [phone]

## 2019-04-15 NOTE — Telephone Encounter (Signed)
Noel from GI, called and stated orders had been placed for patient and they had no prior records for patient. They called to question whether provider meant to send patient and orders there.   Consulted with provider and provider stated that she would like patient to get breast scan done with [facility] and dexa scan done with [facility]. Orders will need to be faxed to [facility] as well for dexa scan. Patient normally goes to [facility] and has records.  Notified GI and they stated they would be able to do breast scan for patient but would need prior images and reports from [facility] faxed to their office. They scheduled patient an appointment next Wednesday 05/10/19 at 10:30 am. Also wanted to know if we would like to notify patient of this appointment. Stated they would need records sent a day before patients appointment.

## 2019-04-15 NOTE — Progress Notes (Signed)
Subjective:   [name] is a 86 y.o. female who presents for Medicare Annual (Subsequent) preventive examination.  Review of Systems:   Cardiac Risk Factors include: hypertension;dyslipidemia;advanced age (>71men, >92 women);sedentary lifestyle     Objective:     Vitals: BP 132/84 (BP Location: Left Arm, Patient Position: Sitting, Cuff Size: Normal)   Pulse 72   Temp (!) 97.3 F (36.3 C) (Temporal)   Ht 4' 11.03" (1.499 m)   Wt 121 lb (54.9 kg)   SpO2 96%   BMI 24.42 kg/m   Body mass index is 24.42 kg/m.  Advanced Directives 04/15/2019 04/15/2019 07/14/2018 04/14/2018 03/25/2018 10/07/2017 03/27/2017  Does Patient Have a Medical Advance Directive? Yes Yes Yes Yes Yes Yes Yes  Type of Advance Directive Living will;Out of facility DNR (pink MOST or yellow form) Living will;Out of facility DNR (pink MOST or yellow form) [place];Living will [place];Living will;Out of facility DNR (pink MOST or yellow form) [place];Living will Out of facility DNR (pink MOST or yellow form);Living will Living will;Out of facility DNR (pink MOST or yellow form)  Does patient want to make changes to medical advance directive? No - Patient declined No - Patient declined - No - Patient declined No - Patient declined - No - Patient declined  Copy of [place] in Chart? Yes - validated most recent copy scanned in chart (See row information) Yes - validated most recent copy scanned in chart (See row information) No - copy requested - Yes - validated most recent copy scanned in chart (See row information) - -  Pre-existing out of facility DNR order (yellow form or pink MOST form) Yellow form placed in chart (order not valid for inpatient use) - - Yellow form placed in chart (order not valid for inpatient use) - Yellow form placed in chart (order not valid for inpatient use) Pink MOST form placed in chart (order not valid for inpatient  use);Yellow form placed in chart (order not valid for inpatient use)    Tobacco Social History   Tobacco Use  Smoking Status Never Smoker  Smokeless Tobacco Never Used     Counseling given: Not Answered   Clinical Intake:  Pre-visit preparation completed: Yes  Pain : No/denies pain     BMI - recorded: 24.24 Nutritional Status: BMI of 19-24  Normal Nutritional Risks: Unintentional weight loss Diabetes: No  How often do you need to have someone help you when you read instructions, pamphlets, or other written materials from your doctor or pharmacy?: 1 - Never What is the last grade level you completed in school?: master degree in pubic health        Past Medical History:  Diagnosis Date  . Arthritis   . Cancer [facility])    left breast cancer   . CHF (congestive heart failure) ([place])    PACEMAKER & DEFIB  . Complication of anesthesia    hypotensive after back surgery in 2006- reports on chart   . Depression   . Dyslipidemia   . Fainted 04/21/06   AT [place]  . GERD (gastroesophageal reflux disease)   . Headache(784.0)   . Hearing loss   . HLD (hyperlipidemia)   . Hypertension   . Hypothyroidism   . ICD (implantable cardiac defibrillator) in place    pt has pacer/icd  . ICD (implantable cardiac defibrillator), biventricular, in situ   . LBBB (left bundle branch block)   . Memory loss   .  Nonischemic cardiomyopathy ([place])   . Normal coronary arteries    s/p cardiac cath 2007  . Pacemaker    Guidant Device  . Syncope   . Systolic CHF ([place])   . Vertigo   . Wears glasses    Past Surgical History:  Procedure Laterality Date  . BACK SURGERY     lumbar fusion   . BREAST SURGERY  2000   LUMP REMOVAL. STAGE 1 CANCER  . CARDIAC CATHETERIZATION    . CATARACT EXTRACTION    . EYE SURGERY    . IMPLANTABLE CARDIOVERTER DEFIBRILLATOR GENERATOR CHANGE N/A 12/18/2012   Procedure: IMPLANTABLE CARDIOVERTER DEFIBRILLATOR GENERATOR CHANGE;  Surgeon: [name], MD;   Location: [facility] CATH LAB;  Service: Cardiovascular;  Laterality: N/A;  . JOINT REPLACEMENT  06/14/01   right  . LUMBAR FUSION  2006  . MASS EXCISION  11/08/2011   Procedure: EXCISION MASS;  Surgeon: [name], MD;  Location: [facility];  Service: General;  Laterality: Left;  Excision Left Thigh Mass  . MASTECTOMY, PARTIAL  2008   GOT PACEMAKER AND DEFIB AT THAT TIME  . PACEMAKER INSERTION  04/23/06  . TOTAL KNEE ARTHROPLASTY  05/17/01   RIGHT KNEE  . TOTAL KNEE ARTHROPLASTY Left 11/29/2014   Procedure: TOTAL LEFT KNEE ARTHROPLASTY;  Surgeon: Paralee Cancel, MD;  Location[facility];  Service: Orthopedics;  Laterality: Left;   Family History  Problem Relation Age of Onset  . Hypertension Mother   . Arthritis Mother   . Hypertension Father   . Hypertension Brother   . Hypertension Brother    Social History   Socioeconomic History  . Marital status: Married    Spouse name: Not on file  . Number of children: 1  . Years of education: Masters  . Highest education level: Not on file  Occupational History  . Occupation: Retired  Tobacco Use  . Smoking status: Never Smoker  . Smokeless tobacco: Never Used  Substance and Sexual Activity  . Alcohol use: No  . Drug use: No  . Sexual activity: Not Currently    Birth control/protection: Post-menopausal  Other Topics Concern  . Not on file  Social History Narrative   Lives at home with husband.   Right-handed.      As of 07/28/2014   Diet: No special diet   Caffeine: yes, Chocolate, tea and sodas    Married: YES, 1970   House: Yes, 2 stories, 2-3 persons live in home   Pets: No   Current/Past profession: Engineer, mining, Designer, jewellery    Exercise: Yes 2-3 x weekly   Living Will: Yes   DNR: No   POA/HPOA: No      Social Determinants of Radio broadcast assistant Strain:   . Difficulty of Paying Living Expenses: Not on file  Food Insecurity:   . Worried About Charity fundraiser in the Last Year: Not on file  . Ran Out of Food in  the Last Year: Not on file  Transportation Needs:   . Lack of Transportation (Medical): Not on file  . Lack of Transportation (Non-Medical): Not on file  Physical Activity:   . Days of Exercise per Week: Not on file  . Minutes of Exercise per Session: Not on file  Stress:   . Feeling of Stress : Not on file  Social Connections:   . Frequency of Communication with Friends and Family: Not on file  . Frequency of Social Gatherings with Friends and Family: Not on  file  . Attends Religious Services: Not on file  . Active Member of Clubs or Organizations: Not on file  . Attends Archivist Meetings: Not on file  . Marital Status: Not on file    Outpatient Encounter Medications as of 04/15/2019  Medication Sig  . buPROPion (WELLBUTRIN XL) 150 MG 24 hr tablet Take 3 tablets (450 mg total) by mouth daily.  . Calcium Carbonate (CALCIUM 500 PO) Take 500 mg by mouth daily.  . carvedilol (COREG) 3.125 MG tablet TAKE 1 TABLET BY MOUTH TWO  TIMES DAILY  . cholecalciferol (VITAMIN D) 1000 UNITS tablet Take 1,000 Units by mouth daily.  . furosemide (LASIX) 20 MG tablet Take 1 tablet (20 mg total) by mouth daily.  Marland Kitchen levothyroxine (SYNTHROID, LEVOTHROID) 100 MCG tablet Take 1 tablet (100 mcg total) by mouth daily before breakfast.  . Magnesium 250 MG TABS Take 250 mg by mouth daily.  . NONFORMULARY OR COMPOUNDED ITEM Diclofenac-Baclofen 2% [id number] 1-2 grams topically four times daily. Before application of cream rub 2 minutes to active max penetration. Quantity 120 grams  . Omega-3 Fatty Acids (FISH OIL) 1200 MG CAPS Take 1,200 mg by mouth every morning.  Marland Kitchen omeprazole (PRILOSEC) 40 MG capsule TAKE ONE CAPSULE BY MOUTH  ONCE DAILY FOR STOMACH  . Oxcarbazepine (TRILEPTAL) 300 MG tablet Take 300 mg by mouth every morning.   . potassium chloride (K-DUR) 10 MEQ tablet TAKE 1 TABLET BY MOUTH  DAILY  . sertraline (ZOLOFT) 100 MG tablet Take 200 mg by mouth daily.   Marland Kitchen spironolactone (ALDACTONE) 25 MG  tablet TAKE 1 TABLET BY MOUTH IN  THE MORNING  . traMADol (ULTRAM) 50 MG tablet Take 1 tablet by mouth as needed.  . vitamin E 1000 UNIT capsule Take 1,000 Units by mouth daily.  . [DISCONTINUED] Baclofen 5 MG TABS Take 5 mg by mouth 2 (two) times daily as needed.  . [DISCONTINUED] Calcium Carbonate (CALCIUM 500 PO) Take 500 mg by mouth daily.    No facility-administered encounter medications on file as of 04/15/2019.    Activities of Daily Living In your present state of health, do you have any difficulty performing the following activities: 04/15/2019  Hearing? Y  Comment following with audiologist  Vision? N  Difficulty concentrating or making decisions? Y  Walking or climbing stairs? N  Dressing or bathing? N  Doing errands, shopping? N  Preparing Food and eating ? N  Using the Toilet? N  In the past six months, have you accidently leaked urine? N  Do you have problems with loss of bowel control? N  Managing your Medications? N  Managing your Finances? N  Housekeeping or managing your Housekeeping? Y  Some recent data might be hidden    Patient Care Team: [name], NP as PCP - General (Geriatric Medicine) [name], MD as Consulting Physician (General Surgery) Paralee Cancel, MD as Consulting Physician (Orthopedic Surgery) Marica Otter, [place] (Optometry) Marcial Pacas, MD as Consulting Physician (Neurology) [name], MD as Consulting Physician (Cardiology) Ricard Dillon, MD (Psychiatry)    Assessment:   This is a routine wellness examination for Shaana.  Exercise Activities and Dietary recommendations Current Exercise Habits: Home exercise routine, Type of exercise: calisthenics, Time (Minutes): 25, Frequency (Times/Week): 3, Weekly Exercise (Minutes/Week): 75, Intensity: Mild  Goals    . DIET - INCREASE WATER INTAKE     Pt will try to increase water intake to 3-4 cups a day  Fall Risk Fall Risk  04/15/2019 01/15/2019 12/16/2018 11/24/2018  11/14/2018  Falls in the past year? 1 0 0 0 0  Number falls in past yr: 0 - 0 0 -  Injury with Fall? 0 - 0 0 0  Comment - - - - -   Is the patient's home free of loose throw rugs in walkways, pet beds, electrical cords, etc?   yes      Grab bars in the bathroom? no      Handrails on the stairs?   yes      Adequate lighting?   yes  Timed Get Up and Go performed: na  Depression Screen PHQ 2/9 Scores 04/15/2019 11/24/2018 11/14/2018 07/14/2018  PHQ - 2 Score 0 0 0 -  PHQ- 9 Score - - - -  Exception Documentation (No Data) - - Medical reason     Cognitive Function MMSE - Mini Mental State Exam 01/21/2019 01/29/2018 07/24/2017 01/16/2017 01/16/2017  Not completed: (No Data) (No Data) - - -  Orientation to time 5 5 4 4 4   Orientation to Place 4 5 5 5 5   Registration 3 3 3 3 3   Attention/ Calculation 1 5 2 5 5   Recall 3 1 2 1 1   Language- name 2 objects 1 2 2 2 2   Language- repeat 1 1 1 1 1   Language- follow 3 step command 2 3 3 3 3   Language- follow 3 step command-comments she didnt fold the paper - - - -  Language- read & follow direction 1 1 1 1 1   Write a sentence 1 1 1 1 1   Copy design 1 1 1 1 1   Total score 23 28 25 27 27         Immunization History  Administered Date(s) Administered  . Influenza, High Dose Seasonal PF 01/16/2018, 12/08/2018  . Influenza-Unspecified 12/08/2013, 01/10/2015, 12/09/2015, 01/12/2017  . Pneumococcal Conjugate-13 04/09/2012  . Pneumococcal Polysaccharide-23 02/28/2016  . Tdap 01/07/2013  . Zoster 08/05/2014    Qualifies for Shingles Vaccine?yes, recommended  Screening Tests Health Maintenance  Topic Date Due  . MAMMOGRAM  03/15/2020  . TETANUS/TDAP  01/08/2023  . INFLUENZA VACCINE  Completed  . DEXA SCAN  Completed  . PNA vac Low Risk Adult  Completed    Cancer Screenings: Lung: Low Dose CT Chest recommended if Age 67-80 years, 30 pack-year currently smoking OR have quit w/in 15years. Patient does not qualify. Breast:  Up to date on  Mammogram? Yes   Up to date of Bone Density/Dexa? No Colorectal: aged out  Additional Screenings: na Hepatitis C Screening:      Plan:      I have personally reviewed and noted the following in the patient's chart:   . Medical and social history . Use of alcohol, tobacco or illicit drugs  . Current medications and supplements . Functional ability and status . Nutritional status . Physical activity . Advanced directives . List of other physicians . Hospitalizations, surgeries, and ER visits in previous 12 months . Vitals . Screenings to include cognitive, depression, and falls . Referrals and appointments  In addition, I have reviewed and discussed with patient certain preventive protocols, quality metrics, and best practice recommendations. A written personalized care plan for preventive services as well as general preventive health recommendations were provided to patient.     [name], NP  04/15/2019

## 2019-04-15 NOTE — Progress Notes (Signed)
Careteam: Patient Care Team: [name], NP as PCP - General (Geriatric Medicine) [name], MD as Consulting Physician (General Surgery) Paralee Cancel, MD as Consulting Physician (Orthopedic Surgery) Marica Otter, [place] (Optometry) Marcial Pacas, MD as Consulting Physician (Neurology) [name], MD as Consulting Physician (Cardiology) Ricard Dillon, MD (Psychiatry)  Advanced Directive information Does Patient Have a Medical Advance Directive?: Yes, Type of Advance Directive: Living will;Out of facility DNR (pink MOST or yellow form), Pre-existing out of facility DNR order (yellow form or pink MOST form): Yellow form placed in chart (order not valid for inpatient use), Does patient want to make changes to medical advance directive?: No - Patient declined  Allergies  Allergen Reactions  . Iodine Shortness Of Breath    Iodine contrast, CHF , SOB  . Shellfish Allergy Shortness Of Breath  . Memantine     Malaise, fogginess/ couldn't think  . Aspirin Nausea And Vomiting  . Codeine Nausea And Vomiting    Chief Complaint  Patient presents with  . Annual Exam    Yearly physical, breast exam completed in 12/2018      HPI: Patient is a 86 y.o. female seen in the office today for routine follow up. Completed AWV today.  pts husband passed away last month. She lives alone. She has one daughter that lives out of town but trying to move closer to home since pts husband died.  Pt reports she feels more wobbly, not strong. Had a fall where she was sitting on the couch and slide off into the floor, unsure what exactly happened but she did not get hurt. No LOC.   Admits she does have a hard time remembering. She is followed by neurology for memory loss.   CHF- continues on lasix, aldactone and coreg. No swelling in her legs. No shortness of breath or chest pains. Keeps follow up with cardiologist.   Depression- controlled with Wellbutrin. Doing well after the loss of  her husband, feeling like she is coping effectively.   GERD- worse if she does not eat the right foods. Continues on omeprazole  hypothryoid- continues on synthroid 100 mcg, recent tsh 1.88  osteoporosis on prolia with cal and vit d, can not walk due worsen of to knee pain when she does.   OA to knee- using diclofenac- baclofen compound by sports medicine- this helps.   Report sharp pain to left chest that happens daily. Last about 5-10 mins, reports "medium" pain, 5/10. Not related to activity or eating. Does not feel like much when she is laying down. Happens randonly throughtout the day. Pain started 6 months ago. 2 months started noticing an increase in fullness to left breast   Review of Systems:  Review of Systems  Constitutional: Negative for chills, fever and weight loss.  HENT: Negative for tinnitus.   Respiratory: Negative for cough, sputum production and shortness of breath.   Cardiovascular: Negative for chest pain, palpitations and leg swelling.  Gastrointestinal: Negative for abdominal pain, constipation, diarrhea and heartburn.  Genitourinary: Negative for dysuria, frequency and urgency.  Musculoskeletal: Positive for joint pain and myalgias. Negative for back pain and falls.       Mild aches to back and knees, related to aging  Skin: Negative.   Neurological: Positive for weakness. Negative for dizziness and headaches.  Psychiatric/Behavioral: Positive for memory loss. Negative for depression. The patient is not nervous/anxious and does not have insomnia.     Past Medical History:  Diagnosis Date  .  Arthritis   . Cancer [facility])    left breast cancer   . CHF (congestive heart failure) ([place])    PACEMAKER & DEFIB  . Complication of anesthesia    hypotensive after back surgery in 2006- reports on chart   . Depression   . Dyslipidemia   . Fainted 04/21/06   AT [place]  . GERD (gastroesophageal reflux disease)   . Headache(784.0)   . Hearing loss   . HLD  (hyperlipidemia)   . Hypertension   . Hypothyroidism   . ICD (implantable cardiac defibrillator) in place    pt has pacer/icd  . ICD (implantable cardiac defibrillator), biventricular, in situ   . LBBB (left bundle branch block)   . Memory loss   . Nonischemic cardiomyopathy ([place])   . Normal coronary arteries    s/p cardiac cath 2007  . Pacemaker    Guidant Device  . Syncope   . Systolic CHF ([place])   . Vertigo   . Wears glasses    Past Surgical History:  Procedure Laterality Date  . BACK SURGERY     lumbar fusion   . BREAST SURGERY  2000   LUMP REMOVAL. STAGE 1 CANCER  . CARDIAC CATHETERIZATION    . CATARACT EXTRACTION    . EYE SURGERY    . IMPLANTABLE CARDIOVERTER DEFIBRILLATOR GENERATOR CHANGE N/A 12/18/2012   Procedure: IMPLANTABLE CARDIOVERTER DEFIBRILLATOR GENERATOR CHANGE;  Surgeon: [name], MD;  Location: [facility] CATH LAB;  Service: Cardiovascular;  Laterality: N/A;  . JOINT REPLACEMENT  06/14/01   right  . LUMBAR FUSION  2006  . MASS EXCISION  11/08/2011   Procedure: EXCISION MASS;  Surgeon: [name], MD;  [facility];  Service: General;  Laterality: Left;  Excision Left Thigh Mass  . MASTECTOMY, PARTIAL  2008   GOT PACEMAKER AND DEFIB AT THAT TIME  . PACEMAKER INSERTION  04/23/06  . TOTAL KNEE ARTHROPLASTY  05/17/01   RIGHT KNEE  . TOTAL KNEE ARTHROPLASTY Left 11/29/2014   Procedure: TOTAL LEFT KNEE ARTHROPLASTY;  Surgeon: Paralee Cancel, MD;  Location[facility];  Service: Orthopedics;  Laterality: Left;   Social History:   reports that she has never smoked. She has never used smokeless tobacco. She reports that she does not drink alcohol or use drugs.  Family History  Problem Relation Age of Onset  . Hypertension Mother   . Arthritis Mother   . Hypertension Father   . Hypertension Brother   . Hypertension Brother     Medications: Patient's Medications  New Prescriptions   No medications on file  Previous Medications   BUPROPION (WELLBUTRIN XL) 150 MG  24 HR TABLET    Take 3 tablets (450 mg total) by mouth daily.   CALCIUM CARBONATE (CALCIUM 500 PO)    Take 500 mg by mouth daily.   CARVEDILOL (COREG) 3.125 MG TABLET    TAKE 1 TABLET BY MOUTH TWO  TIMES DAILY   CHOLECALCIFEROL (VITAMIN D) 1000 UNITS TABLET    Take 1,000 Units by mouth daily.   FUROSEMIDE (LASIX) 20 MG TABLET    Take 1 tablet (20 mg total) by mouth daily.   LEVOTHYROXINE (SYNTHROID, LEVOTHROID) 100 MCG TABLET    Take 1 tablet (100 mcg total) by mouth daily before breakfast.   MAGNESIUM 250 MG TABS    Take 250 mg by mouth daily.   NONFORMULARY OR COMPOUNDED ITEM    Diclofenac-Baclofen 2% [id number] 1-2 grams topically four times daily. Before application of cream rub 2  minutes to active max penetration. Quantity 120 grams   OMEGA-3 FATTY ACIDS (FISH OIL) 1200 MG CAPS    Take 1,200 mg by mouth every morning.   OMEPRAZOLE (PRILOSEC) 40 MG CAPSULE    TAKE ONE CAPSULE BY MOUTH  ONCE DAILY FOR STOMACH   OXCARBAZEPINE (TRILEPTAL) 300 MG TABLET    Take 300 mg by mouth every morning.    POTASSIUM CHLORIDE (K-DUR) 10 MEQ TABLET    TAKE 1 TABLET BY MOUTH  DAILY   SERTRALINE (ZOLOFT) 100 MG TABLET    Take 200 mg by mouth daily.    SPIRONOLACTONE (ALDACTONE) 25 MG TABLET    TAKE 1 TABLET BY MOUTH IN  THE MORNING   TRAMADOL (ULTRAM) 50 MG TABLET    Take 1 tablet by mouth as needed.   VITAMIN E 1000 UNIT CAPSULE    Take 1,000 Units by mouth daily.  Modified Medications   No medications on file  Discontinued Medications   No medications on file    Physical Exam:  Vitals:   04/15/19 1026  BP: 132/84  Pulse: 72  Temp: (!) 97.3 F (36.3 C)  TempSrc: Temporal  SpO2: 96%  Weight: 121 lb (54.9 kg)  Height: 4' 11.03" (1.499 m)   Body mass index is 24.42 kg/m. Wt Readings from Last 3 Encounters:  04/15/19 121 lb (54.9 kg)  04/15/19 121 lb (54.9 kg)  01/21/19 117 lb 6.4 oz (53.3 kg)    Physical Exam Constitutional:      General: She is not in acute distress.    Appearance: She is  well-developed. She is not diaphoretic.  HENT:     Head: Normocephalic and atraumatic.     Mouth/Throat:     Pharynx: No oropharyngeal exudate.  Eyes:     Conjunctiva/sclera: Conjunctivae normal.     Pupils: Pupils are equal, round, and reactive to light.  Cardiovascular:     Rate and Rhythm: Normal rate and regular rhythm.     Heart sounds: Normal heart sounds.  Pulmonary:     Effort: Pulmonary effort is normal.     Breath sounds: Normal breath sounds.  Chest:     Breasts:        Right: Normal.        Left: Mass (egg shaped full mass noted around 12 oclock to left breast extended down to nipple) and tenderness present. No inverted nipple, nipple discharge or skin change.  Abdominal:     General: Bowel sounds are normal.     Palpations: Abdomen is soft.  Musculoskeletal:        General: No tenderness.     Cervical back: Normal range of motion and neck supple.  Skin:    General: Skin is warm and dry.  Neurological:     Mental Status: She is alert and oriented to person, place, and time.     Labs reviewed: Basic Metabolic Panel: Recent Labs    07/11/18 0956 11/10/18 0904  NA 141 143  K 4.5 4.3  CL 104 105  CO2 30 29  GLUCOSE 93 96  BUN 23 27*  CREATININE 0.92* 0.87  CALCIUM 9.8 10.3  TSH  --  1.88   Liver Function Tests: Recent Labs    11/10/18 0904  AST 29  ALT 25  BILITOT 0.3  PROT 7.0   No results for input(s): LIPASE, AMYLASE in the last 8760 hours. No results for input(s): AMMONIA in the last 8760 hours. CBC: Recent Labs    11/10/18 0904  WBC 7.4  NEUTROABS 4,847  HGB 12.9  HCT 39.2  MCV 85.4  PLT 119*   Lipid Panel: Recent Labs    11/10/18 0904  CHOL 182  HDL 60  LDLCALC 104*  TRIG 88  CHOLHDL 3.0   TSH: Recent Labs    11/10/18 0904  TSH 1.88   A1C: No results found for: HGBA1C   Assessment/Plan 1. Imbalance Ongoing issue, thought to be related to back issues but with some debility as well. Discussed PT consult but she  declined at this time.  - COMPLETE METABOLIC PANEL WITH GFR  2. Mild cognitive impairment Ongoing. Pt admits her memory has declined. Her daughter is planning to move home and closer to her. - COMPLETE METABOLIC PANEL WITH GFR  3. Benign hypertension Stable on coreg and lasix and aldactone - COMPLETE METABOLIC PANEL WITH GFR - CBC with Differential/Platelet  4. Chronic systolic heart failure (HCC) Stable, euvolemic at this time. Will continue current regimen - COMPLETE METABOLIC PANEL WITH GFR - CBC with Differential/Platelet  5. Hypothyroidism due to acquired atrophy of thyroid TSH at goal on last labs. Continue current regimen  6. Abnormal weight loss -weight has been trending up more recently with supplement. Encouraged 3 meals a day. Without worsening swelling or fluid retention at this time.  7. Age-related osteoporosis without current pathological fracture -continues on prolia with cal and vit d - COMPLETE METABOLIC PANEL WITH GFR - CBC with Differential/Platelet  8. Senile purpura (HCC) - CBC with Differential/Platelet  9. Breast mass, left -noted today on exam. Very tender on exam - MM Digital Diagnostic Unilat L; Future - US BREAST COMPLETE UNI LEFT INC AXILLA; Future  Next appt: 3 months, sooner if needed Lomax Poehler K. [place], [facility] [phone]

## 2019-04-15 NOTE — Patient Instructions (Signed)
[name] , Thank you for taking time to come for your Medicare Wellness Visit. I appreciate your ongoing commitment to your health goals. Please review the following plan we discussed and let me know if I can assist you in the future.   Screening recommendations/referrals: Colonoscopy aged out Mammogram up to date Bone Density NEEDED, this has been ordered, recommend scheduling.  Recommended yearly ophthalmology/optometry visit for glaucoma screening and checkup Recommended yearly dental visit for hygiene and checkup  Vaccinations: Influenza vaccine up to date Pneumococcal vaccine up to date Tdap vaccine up to date Shingles vaccine RECOMMENDED, to get at local pharmacy    Advanced directives: on file  Conditions/risks identified: progressive memory loss   Next appointment: 1 year   Preventive Care 84 Years and Older, Female Preventive care refers to lifestyle choices and visits with your health care provider that can promote health and wellness. What does preventive care include?  A yearly physical exam. This is also called an annual well check.  Dental exams once or twice a year.  Routine eye exams. Ask your health care provider how often you should have your eyes checked.  Personal lifestyle choices, including:  Daily care of your teeth and gums.  Regular physical activity.  Eating a healthy diet.  Avoiding tobacco and drug use.  Limiting alcohol use.  Practicing safe sex.  Taking low-dose aspirin every day.  Taking vitamin and mineral supplements as recommended by your health care provider. What happens during an annual well check? The services and screenings done by your health care provider during your annual well check will depend on your age, overall health, lifestyle risk factors, and family history of disease. Counseling  Your health care provider may ask you questions about your:  Alcohol use.  Tobacco use.  Drug use.  Emotional  well-being.  Home and relationship well-being.  Sexual activity.  Eating habits.  History of falls.  Memory and ability to understand (cognition).  Work and work Statistician.  Reproductive health. Screening  You may have the following tests or measurements:  Height, weight, and BMI.  Blood pressure.  Lipid and cholesterol levels. These may be checked every 5 years, or more frequently if you are over 46 years old.  Skin check.  Lung cancer screening. You may have this screening every year starting at age 40 if you have a 30-pack-year history of smoking and currently smoke or have quit within the past 15 years.  Fecal occult blood test (FOBT) of the stool. You may have this test every year starting at age 10.  Flexible sigmoidoscopy or colonoscopy. You may have a sigmoidoscopy every 5 years or a colonoscopy every 10 years starting at age 19.  Hepatitis C blood test.  Hepatitis B blood test.  Sexually transmitted disease (STD) testing.  Diabetes screening. This is done by checking your blood sugar (glucose) after you have not eaten for a while (fasting). You may have this done every 1-3 years.  Bone density scan. This is done to screen for osteoporosis. You may have this done starting at age 2.  Mammogram. This may be done every 1-2 years. Talk to your health care provider about how often you should have regular mammograms. Talk with your health care provider about your test results, treatment options, and if necessary, the need for more tests. Vaccines  Your health care provider may recommend certain vaccines, such as:  Influenza vaccine. This is recommended every year.  Tetanus, diphtheria, and acellular pertussis (Tdap, Td) vaccine.  You may need a Td booster every 10 years.  Zoster vaccine. You may need this after age 22.  Pneumococcal 13-valent conjugate (PCV13) vaccine. One dose is recommended after age 76.  Pneumococcal polysaccharide (PPSV23) vaccine. One  dose is recommended after age 69. Talk to your health care provider about which screenings and vaccines you need and how often you need them. This information is not intended to replace advice given to you by your health care provider. Make sure you discuss any questions you have with your health care provider. Document Released: 04/22/2015 Document Revised: 12/14/2015 Document Reviewed: 01/25/2015 Elsevier Interactive Patient Education  2017 [place] Prevention in the Home Falls can cause injuries. They can happen to people of all ages. There are many things you can do to make your home safe and to help prevent falls. What can I do on the outside of my home?  Regularly fix the edges of walkways and driveways and fix any cracks.  Remove anything that might make you trip as you walk through a door, such as a raised step or threshold.  Trim any bushes or trees on the path to your home.  Use bright outdoor lighting.  Clear any walking paths of anything that might make someone trip, such as rocks or tools.  Regularly check to see if handrails are loose or broken. Make sure that both sides of any steps have handrails.  Any raised decks and porches should have guardrails on the edges.  Have any leaves, snow, or ice cleared regularly.  Use sand or salt on walking paths during winter.  Clean up any spills in your garage right away. This includes oil or grease spills. What can I do in the bathroom?  Use night lights.  Install grab bars by the toilet and in the tub and shower. Do not use towel bars as grab bars.  Use non-skid mats or decals in the tub or shower.  If you need to sit down in the shower, use a plastic, non-slip stool.  Keep the floor dry. Clean up any water that spills on the floor as soon as it happens.  Remove soap buildup in the tub or shower regularly.  Attach bath mats securely with double-sided non-slip rug tape.  Do not have throw rugs and other  things on the floor that can make you trip. What can I do in the bedroom?  Use night lights.  Make sure that you have a light by your bed that is easy to reach.  Do not use any sheets or blankets that are too big for your bed. They should not hang down onto the floor.  Have a firm chair that has side arms. You can use this for support while you get dressed.  Do not have throw rugs and other things on the floor that can make you trip. What can I do in the kitchen?  Clean up any spills right away.  Avoid walking on wet floors.  Keep items that you use a lot in easy-to-reach places.  If you need to reach something above you, use a strong step stool that has a grab bar.  Keep electrical cords out of the way.  Do not use floor polish or wax that makes floors slippery. If you must use wax, use non-skid floor wax.  Do not have throw rugs and other things on the floor that can make you trip. What can I do with my stairs?  Do not leave any  items on the stairs.  Make sure that there are handrails on both sides of the stairs and use them. Fix handrails that are broken or loose. Make sure that handrails are as long as the stairways.  Check any carpeting to make sure that it is firmly attached to the stairs. Fix any carpet that is loose or worn.  Avoid having throw rugs at the top or bottom of the stairs. If you do have throw rugs, attach them to the floor with carpet tape.  Make sure that you have a light switch at the top of the stairs and the bottom of the stairs. If you do not have them, ask someone to add them for you. What else can I do to help prevent falls?  Wear shoes that:  Do not have high heels.  Have rubber bottoms.  Are comfortable and fit you well.  Are closed at the toe. Do not wear sandals.  If you use a stepladder:  Make sure that it is fully opened. Do not climb a closed stepladder.  Make sure that both sides of the stepladder are locked into place.  Ask  someone to hold it for you, if possible.  Clearly mark and make sure that you can see:  Any grab bars or handrails.  First and last steps.  Where the edge of each step is.  Use tools that help you move around (mobility aids) if they are needed. These include:  Canes.  Walkers.  Scooters.  Crutches.  Turn on the lights when you go into a dark area. Replace any light bulbs as soon as they burn out.  Set up your furniture so you have a clear path. Avoid moving your furniture around.  If any of your floors are uneven, fix them.  If there are any pets around you, be aware of where they are.  Review your medicines with your doctor. Some medicines can make you feel dizzy. This can increase your chance of falling. Ask your doctor what other things that you can do to help prevent falls. This information is not intended to replace advice given to you by your health care provider. Make sure you discuss any questions you have with your health care provider. Document Released: 01/20/2009 Document Revised: 09/01/2015 Document Reviewed: 04/30/2014 Elsevier Interactive Patient Education  2017 [employer].

## 2019-04-16 LAB — COMPLETE METABOLIC PANEL WITH GFR
AG Ratio: 1.7 (calc) (ref 1.0–2.5)
ALT: 25 U/L (ref 6–29)
AST: 28 U/L (ref 10–35)
Albumin: 4.2 g/dL (ref 3.6–5.1)
Alkaline phosphatase (APISO): 57 U/L (ref 37–153)
BUN: 21 mg/dL (ref 7–25)
CO2: 29 mmol/L (ref 20–32)
Calcium: 10.1 mg/dL (ref 8.6–10.4)
Chloride: 105 mmol/L (ref 98–110)
Creat: 0.84 mg/dL (ref 0.60–0.88)
GFR, Est African American: 74 mL/min/{1.73_m2} (ref >=60)
GFR, Est Non African American: 64 mL/min/{1.73_m2} (ref >=60)
Globulin: 2.5 g/dL (calc) (ref 1.9–3.7)
Glucose, Bld: 85 mg/dL (ref 65–139)
Potassium: 4.2 mmol/L (ref 3.5–5.3)
Sodium: 141 mmol/L (ref 135–146)
Total Bilirubin: 0.4 mg/dL (ref 0.2–1.2)
Total Protein: 6.7 g/dL (ref 6.1–8.1)

## 2019-04-16 LAB — CBC WITH DIFFERENTIAL/PLATELET
Absolute Monocytes: 616 cells/uL (ref 200–950)
Basophils Absolute: 39 cells/uL (ref 0–200)
Basophils Relative: 0.5 %
Eosinophils Absolute: 211 cells/uL (ref 15–500)
Eosinophils Relative: 2.7 %
HCT: 40 % (ref 35.0–45.0)
Hemoglobin: 13.1 g/dL (ref 11.7–15.5)
Lymphs Abs: 1591 cells/uL (ref 850–3900)
MCH: 27.8 pg (ref 27.0–33.0)
MCHC: 32.8 g/dL (ref 32.0–36.0)
MCV: 84.7 fL (ref 80.0–100.0)
MPV: 11.4 fL (ref 7.5–12.5)
Monocytes Relative: 7.9 %
Neutro Abs: 5343 cells/uL (ref 1500–7800)
Neutrophils Relative %: 68.5 %
Platelets: 121 10*3/uL — ABNORMAL LOW (ref 140–400)
RBC: 4.72 10*6/uL (ref 3.80–5.10)
RDW: 13.9 % (ref 11.0–15.0)
Total Lymphocyte: 20.4 %
WBC: 7.8 10*3/uL (ref 3.8–10.8)

## 2019-04-16 NOTE — Telephone Encounter (Signed)
Patient had mammogram at [facility] on 03/16/19.

## 2019-04-22 ENCOUNTER — Other Ambulatory Visit: Payer: Medicare Other

## 2019-04-28 ENCOUNTER — Telehealth: Payer: Self-pay | Admitting: Internal Medicine

## 2019-04-28 NOTE — Telephone Encounter (Signed)
New Message:      Pt wanted to check  with Dr Lovena Le to see if it is alright with her condition, to take the COVID Vaccine?   Pt says she needs to know by 3:30 today please.

## 2019-04-28 NOTE — Telephone Encounter (Signed)
We are recommending the COVID-19 vaccine to all of our patients. Cardiac medications (including blood thinners) should not deter anyone from being vaccinated and there is no need to hold any of those medications prior to vaccine administration.     Currently, there is a hotline to call (active 04/17/19) to schedule vaccination appointments as no walk-ins will be accepted.   Number: [phone].    If an appointment is not available please go to [email or web address] to sign up for notification when additional vaccine appointments are available.   If you have further questions or concerns about the vaccine process, please visit [email or web address] or contact your primary care physician.   Pt was notified of the above information regarding The COVID Vaccine

## 2019-04-30 ENCOUNTER — Ambulatory Visit
Admission: RE | Admit: 2019-04-30 | Discharge: 2019-04-30 | Disposition: A | Discharge status: Home / Self Care | Payer: Medicare Other | Source: Ambulatory Visit | Attending: Nurse Practitioner | Admitting: Nurse Practitioner

## 2019-04-30 ENCOUNTER — Other Ambulatory Visit: Payer: Self-pay

## 2019-04-30 ENCOUNTER — Other Ambulatory Visit: Payer: Self-pay | Admitting: Nurse Practitioner

## 2019-04-30 DIAGNOSIS — N6320 Unspecified lump in the left breast, unspecified quadrant: Secondary | ICD-10-CM

## 2019-05-01 ENCOUNTER — Other Ambulatory Visit: Payer: Self-pay | Admitting: Nurse Practitioner

## 2019-05-01 ENCOUNTER — Ambulatory Visit
Admission: RE | Admit: 2019-05-01 | Discharge: 2019-05-01 | Disposition: A | Discharge status: Home / Self Care | Payer: Medicare Other | Source: Ambulatory Visit | Attending: Nurse Practitioner | Admitting: Nurse Practitioner

## 2019-05-01 ENCOUNTER — Other Ambulatory Visit: Payer: Self-pay

## 2019-05-01 DIAGNOSIS — N6320 Unspecified lump in the left breast, unspecified quadrant: Secondary | ICD-10-CM

## 2019-05-04 LAB — HM DEXA SCAN

## 2019-05-06 ENCOUNTER — Telehealth: Payer: Self-pay

## 2019-05-06 ENCOUNTER — Other Ambulatory Visit: Payer: Self-pay | Admitting: General Surgery

## 2019-05-06 ENCOUNTER — Encounter: Payer: Self-pay | Admitting: Nurse Practitioner

## 2019-05-06 ENCOUNTER — Other Ambulatory Visit: Payer: Self-pay

## 2019-05-06 ENCOUNTER — Ambulatory Visit (INDEPENDENT_AMBULATORY_CARE_PROVIDER_SITE_OTHER): Payer: Medicare Other

## 2019-05-06 DIAGNOSIS — C50912 Malignant neoplasm of unspecified site of left female breast: Secondary | ICD-10-CM

## 2019-05-06 DIAGNOSIS — M810 Age-related osteoporosis without current pathological fracture: Secondary | ICD-10-CM | POA: Diagnosis not present

## 2019-05-06 MED ORDER — DENOSUMAB 60 MG/ML ~~LOC~~ SOSY
60.0000 mg | PREFILLED_SYRINGE | Freq: Once | SUBCUTANEOUS | Status: AC
  Administered 2019-05-06: 60 mg via SUBCUTANEOUS

## 2019-05-06 NOTE — Telephone Encounter (Signed)
Original report sent to scanning  Per [name], NP Osteopenia noted, patient to continue calcium and vit d with weight bearing exercises . Bone Density improved from previous  Previous -2.8 Now - 2.2    Spoke with patient, patient states she was told to stop calcium due to elevated calcium blood level and she would like to confirm that Janett Billow would like for her to restart calcium   When reviewing patients chart I see where Dr.Carter advised for her to avoid calcium supplements on labs dated 08/30/2017. Calcium level has been normal since.  Janett Billow please clarify

## 2019-05-07 ENCOUNTER — Ambulatory Visit (INDEPENDENT_AMBULATORY_CARE_PROVIDER_SITE_OTHER): Payer: Medicare Other | Admitting: *Deleted

## 2019-05-07 DIAGNOSIS — I5022 Chronic systolic (congestive) heart failure: Secondary | ICD-10-CM

## 2019-05-07 LAB — CUP PACEART REMOTE DEVICE CHECK
Battery Remaining Longevity: 48 mo
Battery Remaining Percentage: 76 %
Brady Statistic RA Percent Paced: 0 %
Brady Statistic RV Percent Paced: 100 %
Date Time Interrogation Session: 20210128071300
HighPow Impedance: 48 Ohm
Implantable Lead Implant Date: 20080116
Implantable Lead Implant Date: 20080116
Implantable Lead Implant Date: 20080116
Implantable Lead Location: 753859
Implantable Lead Location: 753860
Implantable Lead Location: 753860
Implantable Lead Model: 157
Implantable Lead Model: 4469
Implantable Lead Model: 4555
Implantable Lead Serial Number: 136532
Implantable Lead Serial Number: 161542
Implantable Lead Serial Number: 473495
Implantable Pulse Generator Implant Date: 20140911
Lead Channel Impedance Value: 397 Ohm
Lead Channel Impedance Value: 529 Ohm
Lead Channel Impedance Value: 843 Ohm
Lead Channel Pacing Threshold Amplitude: 0.6 V
Lead Channel Pacing Threshold Amplitude: 0.7 V
Lead Channel Pacing Threshold Amplitude: 0.8 V
Lead Channel Pacing Threshold Pulse Width: 0.4 ms
Lead Channel Pacing Threshold Pulse Width: 0.4 ms
Lead Channel Pacing Threshold Pulse Width: 0.8 ms
Lead Channel Setting Pacing Amplitude: 2 V
Lead Channel Setting Pacing Amplitude: 2 V
Lead Channel Setting Pacing Amplitude: 2.4 V
Lead Channel Setting Pacing Pulse Width: 0.4 ms
Lead Channel Setting Pacing Pulse Width: 0.8 ms
Lead Channel Setting Sensing Sensitivity: 0.6 mV
Lead Channel Setting Sensing Sensitivity: 1 mV
Pulse Gen Serial Number: 111765

## 2019-05-07 NOTE — Telephone Encounter (Signed)
Spoke with patient, patient verbalized understanding of instructions. Calcium was removed from current medication list

## 2019-05-07 NOTE — Progress Notes (Signed)
ICD Remote  

## 2019-05-07 NOTE — Telephone Encounter (Signed)
No lets not have her restart the calcium to just continue her vit D and prolia.

## 2019-05-11 ENCOUNTER — Other Ambulatory Visit: Payer: Self-pay | Admitting: General Surgery

## 2019-05-14 ENCOUNTER — Telehealth: Payer: Self-pay | Admitting: *Deleted

## 2019-05-14 NOTE — Telephone Encounter (Signed)
   Primary Cardiologist: Dr. Lovena Le   Chart reviewed as part of pre-operative protocol coverage. Patient has been stable from a cardiac standpoint for the last few years. She was last seen by Dr. Lovena Le in 05/2018 at which time she was doing well. She now has breast cancer and is scheduled for a left breast mastectomy with sentinel lymph node biopsy. I called and spoke with patient today. She reports she has been doing well since her last visit with Dr. Lovena Le. She notes occasional dizziness if she stands too quickly but no palpitations, falls, or syncope. No chest pain, shortness of breath, orthopnea, PND, or edema. Her activity is somewhat limited by her "bad knees" but she is able to complete >4.0 METS without any anginal symptoms. Per Revised Cardiac Risk Index, considered low risk (although this does not account for patient's age).   Patient is scheduled for annual visit with Dr. Lovena Le on 06/16/2010. However, given malignancy and the fact that patient sounds stable from a cardiac standpoint, I do not want to delay procedure. Given past medical history and time since last visit, based on ACC/AHA guidelines, [name] would be at acceptable risk for the planned procedure without further cardiovascular testing.   I will route this recommendation to the requesting party via Epic fax function and remove from pre-op pool.  Please call with questions.  Darreld Mclean, PA-C 05/14/2019, 3:28 PM

## 2019-05-14 NOTE — Telephone Encounter (Signed)
    [facility] Pre-operative Risk Assessment    Request for surgical clearance:  1. What type of surgery is being performed? LEFT BREAST MASTETCOMY W/SENTINEL LYMPH NODES W/BX    2. When is this surgery scheduled? TBD   3. What type of clearance is required (medical clearance vs. Pharmacy clearance to hold med vs. Both)? MEDICAL  4. Are there any medications that need to be held prior to surgery and how long? NONE LISTED   5. Practice name and name of physician performing surgery?  [facility]; DR. Foley   6. What is your office phone number [phone]    7.   What is your office fax number [phone]  8.   Anesthesia type (None, local, MAC, general) ? GENERAL   [name] 05/14/2019, 1:53 PM  _________________________________________________________________   (provider comments below)

## 2019-05-14 NOTE — Telephone Encounter (Signed)
I left a message to please call back with further surgery clearance information.

## 2019-05-14 NOTE — Telephone Encounter (Signed)
Left message to call back and ask to speak with pre-op team. 

## 2019-05-20 ENCOUNTER — Other Ambulatory Visit: Payer: Self-pay

## 2019-05-20 ENCOUNTER — Encounter: Payer: Self-pay | Admitting: Plastic Surgery

## 2019-05-20 ENCOUNTER — Ambulatory Visit (INDEPENDENT_AMBULATORY_CARE_PROVIDER_SITE_OTHER): Payer: Medicare Other | Admitting: Plastic Surgery

## 2019-05-20 VITALS — BP 148/77 | HR 71 | Temp 97.5°F | Ht <= 58 in | Wt 121.8 lb

## 2019-05-20 DIAGNOSIS — Z853 Personal history of malignant neoplasm of breast: Secondary | ICD-10-CM | POA: Diagnosis not present

## 2019-05-20 NOTE — Progress Notes (Signed)
Referring Provider [name], NP [place],  [place] [address]   CC:  Chief Complaint  Patient presents with  . Advice Only    Patient here for consultation for BL breast reduction      [name] is an 86 y.o. female.  HPI: Patient is presenting to discuss breast reconstruction.  She previously had a left-sided breast cancer that was treated with lumpectomy and radiation.  She now has a recurrence in that area.  She is planning undergo a left-sided mastectomy by Dr. Barry Dienes.  She is here today to talk about her reconstructive options.  Patient states she wants to avoid any additional operations other than her cancer operation.  Allergies  Allergen Reactions  . Iodine Shortness Of Breath    Iodine contrast, CHF , SOB  . Shellfish Allergy Shortness Of Breath  . Memantine     Malaise, fogginess/ couldn't think  . Aspirin Nausea And Vomiting  . Codeine Nausea And Vomiting    Outpatient Encounter Medications as of 05/20/2019  Medication Sig  . buPROPion (WELLBUTRIN XL) 150 MG 24 hr tablet Take 3 tablets (450 mg total) by mouth daily.  . carvedilol (COREG) 3.125 MG tablet TAKE 1 TABLET BY MOUTH TWO  TIMES DAILY  . cholecalciferol (VITAMIN D) 1000 UNITS tablet Take 1,000 Units by mouth daily.  . furosemide (LASIX) 20 MG tablet Take 1 tablet (20 mg total) by mouth daily.  Marland Kitchen levothyroxine (SYNTHROID, LEVOTHROID) 100 MCG tablet Take 1 tablet (100 mcg total) by mouth daily before breakfast.  . Magnesium 250 MG TABS Take 250 mg by mouth daily.  . NONFORMULARY OR COMPOUNDED ITEM Diclofenac-Baclofen 2% [id number] 1-2 grams topically four times daily. Before application of cream rub 2 minutes to active max penetration. Quantity 120 grams  . Omega-3 Fatty Acids (FISH OIL) 1200 MG CAPS Take 1,200 mg by mouth every morning.  Marland Kitchen omeprazole (PRILOSEC) 40 MG capsule TAKE ONE CAPSULE BY MOUTH  ONCE DAILY FOR STOMACH  . Oxcarbazepine (TRILEPTAL) 300 MG tablet Take 300 mg  by mouth every morning.   . potassium chloride (K-DUR) 10 MEQ tablet TAKE 1 TABLET BY MOUTH  DAILY  . sertraline (ZOLOFT) 100 MG tablet Take 200 mg by mouth daily.   Marland Kitchen spironolactone (ALDACTONE) 25 MG tablet TAKE 1 TABLET BY MOUTH IN  THE MORNING  . traMADol (ULTRAM) 50 MG tablet Take 1 tablet by mouth as needed.  . vitamin E 1000 UNIT capsule Take 1,000 Units by mouth daily.   No facility-administered encounter medications on file as of 05/20/2019.     Past Medical History:  Diagnosis Date  . Arthritis   . Cancer [facility])    left breast cancer   . CHF (congestive heart failure) ([place])    PACEMAKER & DEFIB  . Complication of anesthesia    hypotensive after back surgery in 2006- reports on chart   . Depression   . Dyslipidemia   . Fainted 04/21/06   AT [place]  . GERD (gastroesophageal reflux disease)   . Headache(784.0)   . Hearing loss   . HLD (hyperlipidemia)   . Hypertension   . Hypothyroidism   . ICD (implantable cardiac defibrillator) in place    pt has pacer/icd  . ICD (implantable cardiac defibrillator), biventricular, in situ   . LBBB (left bundle branch block)   . Memory loss   . Nonischemic cardiomyopathy ([place])   . Normal coronary arteries    s/p cardiac cath 2007  .  Pacemaker    Guidant Device  . Syncope   . Systolic CHF ([place])   . Vertigo   . Wears glasses     Past Surgical History:  Procedure Laterality Date  . BACK SURGERY     lumbar fusion   . BREAST LUMPECTOMY Left 2008  . BREAST SURGERY  2000   LUMP REMOVAL. STAGE 1 CANCER  . CARDIAC CATHETERIZATION    . CATARACT EXTRACTION    . EYE SURGERY    . IMPLANTABLE CARDIOVERTER DEFIBRILLATOR GENERATOR CHANGE N/A 12/18/2012   Procedure: IMPLANTABLE CARDIOVERTER DEFIBRILLATOR GENERATOR CHANGE;  Surgeon: [name], MD;  Location: [facility] CATH LAB;  Service: Cardiovascular;  Laterality: N/A;  . JOINT REPLACEMENT  06/14/01   right  . LUMBAR FUSION  2006  . MASS EXCISION  11/08/2011   Procedure: EXCISION MASS;   Surgeon: [name], MD;  Location: [facility] ORS;  Service: General;  Laterality: Left;  Excision Left Thigh Mass  . MASTECTOMY, PARTIAL  2008   GOT PACEMAKER AND DEFIB AT THAT TIME  . PACEMAKER INSERTION  04/23/06  . TOTAL KNEE ARTHROPLASTY  05/17/01   RIGHT KNEE  . TOTAL KNEE ARTHROPLASTY Left 11/29/2014   Procedure: TOTAL LEFT KNEE ARTHROPLASTY;  Surgeon: Paralee Cancel, MD;  Location: [facility] ORS;  Service: Orthopedics;  Laterality: Left;    Family History  Problem Relation Age of Onset  . Hypertension Mother   . Arthritis Mother   . Hypertension Father   . Hypertension Brother   . Hypertension Brother     Social History   Social History Narrative   Lives at home with husband.   Right-handed.      As of 07/28/2014   Diet: No special diet   Caffeine: yes, Chocolate, tea and sodas    Married: YES, 1970   House: Yes, 2 stories, 2-3 persons live in home   Pets: No   Current/Past profession: Engineer, mining, Designer, jewellery    Exercise: Yes 2-3 x weekly   Living Will: Yes   DNR: No   POA/HPOA: No        Review of Systems General: Denies fevers, chills, weight loss CV: Denies chest pain, shortness of breath, palpitations  Physical Exam Vitals with BMI 05/20/2019 04/15/2019 04/15/2019  Height 4\' 10"  4' 11.025" 4' 11.025"  Weight 121 lbs 13 oz 121 lbs 121 lbs  BMI 25.46 [id number]  Systolic 123456 [id number] Q000111Q  Diastolic 77 84 84  Pulse 71 72 72    General:  No acute distress,  Alert and oriented, Non-Toxic, Normal speech and affect Breast: The left side has a inferior lateral quadrant scar from previous lumpectomy.  There are signs of previous radiation changes.  The right side does not appear to have any scars or masses.  On the left she has a large pacemaker subcutaneously.  This extends from around the level of the clavicle down for 5 cm inferiorly.  Assessment/Plan Patient presents to discuss breast reconstruction.  She admits her priority is to get everything treated from a cancer  standpoint in 1 operation.  She is now willing to go additional procedures.  I brought up the options for breast reconstruction that include flap closure and implant-based reconstruction.  We both agree that a latissimus or TRAM flap would be too extensive for her goals.  Implant-based reconstruction would require a second operation which she does not want.  Furthermore the pacemaker on the left side would complicate implant-based reconstruction.  Patient would like to undergo mastectomy  with primary closure and not have reconstruction.  She did ask about places to get breast prosthesis and we provide her with that information.  I am happy to see her again if she has any other questions or concerns.  Cindra Presume 05/20/2019, 3:36 PM

## 2019-05-23 ENCOUNTER — Ambulatory Visit: Payer: Medicare Other | Attending: Internal Medicine

## 2019-05-23 DIAGNOSIS — Z23 Encounter for immunization: Secondary | ICD-10-CM | POA: Insufficient documentation

## 2019-05-23 NOTE — Progress Notes (Signed)
   Covid-19 Vaccination Clinic  Name:  [name]    MRN: [id number] DOB: 18-Jun-1933  05/23/2019  [name] was observed post Covid-19 immunization for 15 minutes without incidence. She was provided with Vaccine Information Sheet and instruction to access the V-Safe system.   [name] was instructed to call 911 with any severe reactions post vaccine: Marland Kitchen Difficulty breathing  . Swelling of your face and throat  . A fast heartbeat  . A bad rash all over your body  . Dizziness and weakness    Immunizations Administered    Name Date Dose VIS Date Route   Pfizer COVID-19 Vaccine 05/23/2019 10:03 AM 0.3 mL 03/20/2019 Intramuscular   Manufacturer: [place]   Lot: [id number]   [place]: [id number]

## 2019-05-24 ENCOUNTER — Ambulatory Visit: Payer: Medicare Other

## 2019-05-27 ENCOUNTER — Telehealth: Payer: Self-pay | Admitting: Hematology

## 2019-05-27 NOTE — Telephone Encounter (Signed)
Per 2/17 sch msg. Pt is aware of appt date and time

## 2019-06-01 ENCOUNTER — Encounter (HOSPITAL_COMMUNITY)
Admission: RE | Admit: 2019-06-01 | Discharge: 2019-06-01 | Disposition: A | Discharge status: Home / Self Care | Payer: Medicare Other | Source: Ambulatory Visit | Attending: General Surgery | Admitting: General Surgery

## 2019-06-01 ENCOUNTER — Other Ambulatory Visit: Payer: Self-pay

## 2019-06-01 ENCOUNTER — Other Ambulatory Visit (HOSPITAL_COMMUNITY)
Admission: RE | Admit: 2019-06-01 | Discharge: 2019-06-01 | Disposition: A | Discharge status: Home / Self Care | Payer: Medicare Other | Source: Ambulatory Visit | Attending: General Surgery | Admitting: General Surgery

## 2019-06-01 ENCOUNTER — Encounter (HOSPITAL_COMMUNITY): Payer: Self-pay

## 2019-06-01 DIAGNOSIS — Z95810 Presence of automatic (implantable) cardiac defibrillator: Secondary | ICD-10-CM | POA: Insufficient documentation

## 2019-06-01 DIAGNOSIS — H9190 Unspecified hearing loss, unspecified ear: Secondary | ICD-10-CM | POA: Insufficient documentation

## 2019-06-01 DIAGNOSIS — Z79899 Other long term (current) drug therapy: Secondary | ICD-10-CM | POA: Insufficient documentation

## 2019-06-01 DIAGNOSIS — Z01812 Encounter for preprocedural laboratory examination: Secondary | ICD-10-CM | POA: Insufficient documentation

## 2019-06-01 DIAGNOSIS — Z950 Presence of cardiac pacemaker: Secondary | ICD-10-CM | POA: Insufficient documentation

## 2019-06-01 DIAGNOSIS — E785 Hyperlipidemia, unspecified: Secondary | ICD-10-CM | POA: Diagnosis not present

## 2019-06-01 DIAGNOSIS — C50912 Malignant neoplasm of unspecified site of left female breast: Secondary | ICD-10-CM | POA: Insufficient documentation

## 2019-06-01 DIAGNOSIS — Z96653 Presence of artificial knee joint, bilateral: Secondary | ICD-10-CM | POA: Insufficient documentation

## 2019-06-01 DIAGNOSIS — K219 Gastro-esophageal reflux disease without esophagitis: Secondary | ICD-10-CM | POA: Insufficient documentation

## 2019-06-01 DIAGNOSIS — R413 Other amnesia: Secondary | ICD-10-CM | POA: Insufficient documentation

## 2019-06-01 DIAGNOSIS — Z923 Personal history of irradiation: Secondary | ICD-10-CM | POA: Insufficient documentation

## 2019-06-01 DIAGNOSIS — F329 Major depressive disorder, single episode, unspecified: Secondary | ICD-10-CM | POA: Insufficient documentation

## 2019-06-01 DIAGNOSIS — R9431 Abnormal electrocardiogram [ECG] [EKG]: Secondary | ICD-10-CM | POA: Insufficient documentation

## 2019-06-01 DIAGNOSIS — I428 Other cardiomyopathies: Secondary | ICD-10-CM | POA: Insufficient documentation

## 2019-06-01 DIAGNOSIS — I5022 Chronic systolic (congestive) heart failure: Secondary | ICD-10-CM | POA: Diagnosis not present

## 2019-06-01 DIAGNOSIS — I110 Hypertensive heart disease with heart failure: Secondary | ICD-10-CM | POA: Insufficient documentation

## 2019-06-01 DIAGNOSIS — Z7901 Long term (current) use of anticoagulants: Secondary | ICD-10-CM | POA: Diagnosis not present

## 2019-06-01 DIAGNOSIS — Z79890 Hormone replacement therapy (postmenopausal): Secondary | ICD-10-CM | POA: Insufficient documentation

## 2019-06-01 DIAGNOSIS — Z01810 Encounter for preprocedural cardiovascular examination: Secondary | ICD-10-CM | POA: Insufficient documentation

## 2019-06-01 DIAGNOSIS — E039 Hypothyroidism, unspecified: Secondary | ICD-10-CM | POA: Insufficient documentation

## 2019-06-01 DIAGNOSIS — Z20822 Contact with and (suspected) exposure to covid-19: Secondary | ICD-10-CM | POA: Diagnosis not present

## 2019-06-01 DIAGNOSIS — Z01818 Encounter for other preprocedural examination: Secondary | ICD-10-CM | POA: Insufficient documentation

## 2019-06-01 DIAGNOSIS — Z981 Arthrodesis status: Secondary | ICD-10-CM | POA: Insufficient documentation

## 2019-06-01 HISTORY — DX: Unspecified cataract: H26.9

## 2019-06-01 LAB — COMPREHENSIVE METABOLIC PANEL
ALT: 26 U/L (ref 0–44)
AST: 30 U/L (ref 15–41)
Albumin: 3.7 g/dL (ref 3.5–5.0)
Alkaline Phosphatase: 54 U/L (ref 38–126)
Anion gap: 9 (ref 5–15)
BUN: 24 mg/dL — ABNORMAL HIGH (ref 8–23)
CO2: 28 mmol/L (ref 22–32)
Calcium: 10.3 mg/dL (ref 8.9–10.3)
Chloride: 103 mmol/L (ref 98–111)
Creatinine, Ser: 0.92 mg/dL (ref 0.44–1.00)
GFR calc Af Amer: >60 mL/min (ref >60)
GFR calc non Af Amer: 58 mL/min — ABNORMAL LOW (ref >60)
Glucose, Bld: 85 mg/dL (ref 70–99)
Potassium: 4.1 mmol/L (ref 3.5–5.1)
Sodium: 140 mmol/L (ref 135–145)
Total Bilirubin: 0.5 mg/dL (ref 0.3–1.2)
Total Protein: 6.9 g/dL (ref 6.5–8.1)

## 2019-06-01 LAB — CBC WITH DIFFERENTIAL/PLATELET
Abs Immature Granulocytes: 0.03 10*3/uL (ref 0.00–0.07)
Basophils Absolute: 0 10*3/uL (ref 0.0–0.1)
Basophils Relative: 1 %
Eosinophils Absolute: 0.2 10*3/uL (ref 0.0–0.5)
Eosinophils Relative: 3 %
HCT: 43.6 % (ref 36.0–46.0)
Hemoglobin: 13.7 g/dL (ref 12.0–15.0)
Immature Granulocytes: 0 %
Lymphocytes Relative: 24 %
Lymphs Abs: 1.7 10*3/uL (ref 0.7–4.0)
MCH: 28.3 pg (ref 26.0–34.0)
MCHC: 31.4 g/dL (ref 30.0–36.0)
MCV: 90.1 fL (ref 80.0–100.0)
Monocytes Absolute: 0.6 10*3/uL (ref 0.1–1.0)
Monocytes Relative: 9 %
Neutro Abs: 4.4 10*3/uL (ref 1.7–7.7)
Neutrophils Relative %: 63 %
Platelets: 124 10*3/uL — ABNORMAL LOW (ref 150–400)
RBC: 4.84 MIL/uL (ref 3.87–5.11)
RDW: 15.3 % (ref 11.5–15.5)
WBC: 7 10*3/uL (ref 4.0–10.5)
nRBC: 0 % (ref 0.0–0.2)

## 2019-06-01 LAB — SARS CORONAVIRUS 2 (TAT 6-24 HRS): SARS Coronavirus 2: NEGATIVE

## 2019-06-01 NOTE — Progress Notes (Signed)
Patient denies shortness of breath, fever, cough and chest pain.  PCP - Sherrie Mustache, NP Cardiologist - Dr Cristopher Peru Neurology - Evlyn Courier, NP  Chest x-ray - denies EKG - 06/01/19 Stress Test - 08/15/05 ECHO - 08/26/13 Cardiac Cath - 2007  ICD Pacemaker/Loop- [employer] N[facility] CRT-D ICD.  Last remote device check was on 05/07/19.  Perioperative Prescription for ICD faxed.  Awaiting results. Rep. Joey called and informed [phone].  ERAS: Ensure drink given at PAT appt.  Anesthesia review: Yes  7 days prior to surgery STOP taking any Aspirin (unless otherwise instructed by your surgeon), Aleve, Naproxen, Ibuprofen, Motrin, Advil, Goody's, BC's, all herbal medications, fish oil, and all vitamins.   Coronavirus Screening Have you experienced the following symptoms:  Cough yes/no: No Fever (>100.17F)  yes/no: No Runny nose yes/no: No Sore throat yes/no: No Difficulty breathing/shortness of breath  yes/no: No  Have you traveled in the last 14 days and where? yes/no: No  Patient verbalized understanding of instructions that were given to them at the PAT appointment

## 2019-06-01 NOTE — Progress Notes (Addendum)
[place], [place] [address] Phone: [phone] Fax: [place], [place] [facility] [address] [place] [address] [place] [address] Phone: [phone] Fax: [phone]  [place], [place] [address] [place] [address] Phone: [phone] Fax: [phone]     Your procedure is scheduled on Thursday, 06/04/19.  Report to [facility] Main Entrance "A" at 5:30 A.M., and check in at the Admitting office.  Call this number if you have problems the morning of surgery:  [phone]  Call [phone] if you have any questions prior to your surgery date Monday-Friday 8am-4pm   Remember:  Do not eat after midnight the night before your surgery Wed  You may drink clear liquids until 4:30am the morning of your surgery.   Clear liquids allowed are: Water, Non-Citrus Juices (without pulp), Carbonated Beverages, Clear Tea, Black Coffee Only, and Gatorade   Please complete your PRE-SURGERY ENSURE that was provided to you by 4:30 am the morning of surgery.  Please, if able, drink it in one setting. DO NOT SIP.   Take these medicines the morning of surgery with A SIP OF WATER  Wellbutrin Carvedilol Levothyroxine Omeprazole Tylenol if needed   STOP now taking any Aspirin (unless otherwise instructed by your surgeon), Aleve, Naproxen, Ibuprofen, Motrin, Advil, Goody's, BC's, all herbal medications, fish oil, and all vitamins.    The Morning of Surgery  Do not wear jewelry, make-up or nail polish.  Do not wear lotions, powders, or perfumes, or deodorant  Do not shave 48 hours prior to surgery.    Do not bring valuables to the hospital.  [facility] is not responsible for any belongings or valuables.  If you are a smoker, DO NOT Smoke 24 hours prior to surgery  If you wear  a CPAP at night please bring your mask the morning of surgery   Remember that you must have someone to transport you home after your surgery, and remain with you for 24 hours if you are discharged the same day.   Please bring cases for contacts, glasses, hearing aids, dentures or bridgework because it cannot be worn into surgery.   Leave your suitcase in the car.  After surgery it may be brought to your room.  For patients admitted to the hospital, discharge time will be determined by your treatment team.  Patients discharged the day of surgery will not be allowed to drive home.    Special instructions:   [employer]- Preparing For Surgery  Before surgery, you can play an important role. Because skin is not sterile, your skin needs to be as free of germs as possible. You can reduce the number of germs on your skin by washing with CHG (chlorahexidine gluconate) Soap before surgery.  CHG is an antiseptic cleaner which kills germs and bonds with the skin to continue killing germs even after washing.    Oral Hygiene is also important to reduce your risk of infection.  Remember - BRUSH YOUR TEETH THE MORNING OF SURGERY WITH YOUR REGULAR TOOTHPASTE  Please do not use if you have an allergy to CHG or antibacterial soaps. If your skin becomes reddened/irritated stop using the CHG.  Do not shave (including legs and underarms) for at least 48 hours prior to first CHG shower. It is OK to shave your  face.  Please follow these instructions carefully.   1. Shower the NIGHT BEFORE SURGERY Wed  and the MORNING OF SURGERY Thurs with CHG Soap.   2. If you chose to wash your hair, wash your hair first as usual with your normal shampoo.  3. After you shampoo, rinse your hair and body thoroughly to remove the shampoo.  4. Use CHG as you would any other liquid soap. You can apply CHG directly to the skin and wash gently with a scrungie or a clean washcloth.   5. Apply the CHG Soap to your body ONLY FROM  THE NECK DOWN.  Do not use on open wounds or open sores. Avoid contact with your eyes, ears, mouth and genitals (private parts). Wash Face and genitals (private parts)  with your normal soap.   6. Wash thoroughly, paying special attention to the area where your surgery will be performed.  7. Thoroughly rinse your body with warm water from the neck down.  8. DO NOT shower/wash with your normal soap after using and rinsing off the CHG Soap.  9. Pat yourself dry with a CLEAN TOWEL.  10. Wear CLEAN PAJAMAS to bed the night before surgery, wear comfortable clothes the morning of surgery  11. Place CLEAN SHEETS on your bed the night of your first shower and DO NOT SLEEP WITH PETS.    Day of Surgery:  Please shower the morning of surgery with the CHG soap Do not apply any deodorants/lotions. Please wear clean clothes to the hospital/surgery center.   Remember to brush your teeth WITH YOUR REGULAR TOOTHPASTE.   Please read over the following fact sheets that you were given.

## 2019-06-02 ENCOUNTER — Encounter (HOSPITAL_COMMUNITY): Payer: Self-pay

## 2019-06-02 NOTE — Progress Notes (Signed)
Anesthesia Chart Review:  Case: [id number] Date/Time: 06/04/19 0716   Procedure: LEFT MASTECTOMY WITH SENTINEL LYMPH NODE BIOPSY (Left Breast)   Anesthesia type: General   Pre-op diagnosis: RECURRENT LEFT BREAST CANCER   Location: [facility] OR ROOM 02 / [facility]   Surgeons: [name], MD      DISCUSSION: Patient is an 86 year old female scheduled for the above procedure.  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 She reported hypotension with back surgery in 2006.   Preoperative cardiology input outlined on 05/14/19 by Sande Rives, PA-C: "Patient has been stable from a cardiac standpoint for the last few years. She was last seen by Dr. Lovena Le in 05/2018 at which time she was doing well. She now has breast cancer and is scheduled for a left breast mastectomy with sentinel lymph node biopsy. I called and spoke with patient today. She reports she has been doing well since her last visit with Dr. Lovena Le. She notes occasional dizziness if she stands too quickly but no palpitations, falls, or syncope. No chest pain, shortness of breath, orthopnea, PND, or edema. Her activity is somewhat limited by her "bad knees" but she is able to complete >4.0 METS without any anginal symptoms. Per Revised Cardiac Risk Index, considered low risk (although this does not account for patient's age).   Patient is scheduled for annual visit with Dr. Lovena Le on 06/16/2010 [06/16/2019]. However, given malignancy and the fact that patient sounds stable from a cardiac standpoint, I do not want to delay  procedure. Given past medical history and time since last visit, based on ACC/AHA guidelines, Katria Genzer would be at acceptable risk for the planned procedure without further cardiovascular testing."  Per Perioperative Prescription for Implanted Cardiac Devices Rx, she has a [place] and is not pacemaker dependent. She had normal device function on 05/07/19. Procedure may interfere with device function. Magnet should be placed over device during procedure. However, ICD located left chest so discussed with several anesthesiologists, and given ICD proximity to surgical site would recommend [place] representative be notified to temporarily reprogram device while in OR.   Pfizer COVID-19 vaccine 05/23/19. 06/01/2019 presurgical COVID-19 test negative. Anesthesia team to evaluate on the day of surgery.   VS: BP (!) 148/69   Pulse 67   Temp (!) 36.3 C   Ht 4\' 10"  (1.473 m)   Wt 54.4 kg   LMP  (LMP Unknown)   SpO2 100%   BMI 25.08 kg/m    PROVIDERS: [name], NP is PCP  Cristopher Peru, MD is EP cardiologist. Last office visit 05/23/18.  Marcial Pacas, MD is neurologist. Last evaluation 01/21/19 by Butler Denmark, NP for follow-up mild cognitive impairment. Sullivan Lone, MD is HEM-ONC   LABS: Labs reviewed: Acceptable for surgery. (all labs ordered are listed, but only abnormal results are displayed)  Labs Reviewed  CBC WITH DIFFERENTIAL/PLATELET - Abnormal; Notable for the following components:      Result Value   Platelets 124 (*)    All other components within normal limits  COMPREHENSIVE METABOLIC PANEL - Abnormal; Notable for the following components:   BUN 24 (*)  GFR calc non Af Amer 58 (*)    All other components within normal limits    EKG: 06/01/19:  Atrial-sensed ventricular-paced rhythm Abnormal ECG No significant change since last tracing Confirmed by Candee Furbish [phone]) on 06/01/2019 6:09:35 PM   CV: Echo 08/26/13: Study  Conclusions  - Left ventricle: The cavity size was normal. Wall thickness was  normal. Systolic function was mildly reduced. The estimated  ejection fraction was in the range of 45% to 50%. Mild global  hypokinesis. Incoordinate distal septal motion. Doppler  parameters are consistent with abnormal left ventricular  relaxation (grade 1 diastolic dysfunction). The E/e&' ratio is <8,  suggesting normal LV filling pressure.  - Right ventricle: The cavity size was normal. Wall thickness was  normal. AICD wire noted in right ventricle. Systolic function was  normal. RV systolic pressure (S, est): 26 mm Hg.  - Right atrium: The atrium was normal in size. AICD wire noted in  right atrium.  - Atrial septum: No defect or patent foramen ovale was identified.  - Tricuspid valve: There was mild regurgitation.  - Inferior vena cava: The vessel was normal in size. The  respirophasic diameter changes were in the normal range (>= 50%),  consistent with normal central venous pressure.  Impressions:  - EF 45-50%, mild global hypokinesis. Incoordinate septal motion.  ICD wires noted. Diastolic dysfunction with normal LV filling  pressure. [ (Comparison echo 04/11/06: Severe dilated congestive cardiomyopathy, EF 15-25%, mild MR; 01/03/10 LVEF 30-35%)   Per 04/23/06 note by cardiologist Daneen Schick, MD "She has normal  coronary arteries by cardiac catheterization in 2007."   Past Medical History:  Diagnosis Date  . Arthritis   . Cancer [facility])    left breast cancer   . Cataracts, bilateral    removed by surgery  . CHF (congestive heart failure) ([place])    PACEMAKER & DEFIB  . Complication of anesthesia    hypotensive after back surgery in 2006- reports on chart   . Depression   . Dyslipidemia   . Fainted 04/21/06   AT [place]  . GERD (gastroesophageal reflux disease)   . Headache(784.0)   . Hearing loss    bilateral hearing aids  . HLD (hyperlipidemia)    diet controlled   . Hypertension   .  Hypothyroidism   . ICD (implantable cardiac defibrillator) in place    pt has pacer/icd  . ICD (implantable cardiac defibrillator), biventricular, in situ   . LBBB (left bundle branch block)   . Memory loss   . Nonischemic cardiomyopathy ([place])   . Normal coronary arteries    s/p cardiac cath 2007  . Pacemaker    ICD [employer]  . Syncope   . Systolic CHF ([place])   . Vertigo   . Wears glasses     Past Surgical History:  Procedure Laterality Date  . BACK SURGERY     lumbar fusion   . BREAST LUMPECTOMY Left 2008  . BREAST SURGERY  2000   LUMP REMOVAL. STAGE 1 CANCER  . CARDIAC CATHETERIZATION    . CATARACT EXTRACTION    . COLONOSCOPY    . EYE SURGERY    . IMPLANTABLE CARDIOVERTER DEFIBRILLATOR GENERATOR CHANGE N/A 12/18/2012   Procedure: IMPLANTABLE CARDIOVERTER DEFIBRILLATOR GENERATOR CHANGE;  Surgeon: [name], MD;  Location: [facility] CATH LAB;  Service: Cardiovascular;  Laterality: N/A;  . JOINT REPLACEMENT  06/14/01   right  . LUMBAR FUSION  2006  . MASS EXCISION  11/08/2011   Procedure:  EXCISION MASS;  Surgeon: [name], MD;  Location: [facility] ORS;  Service: General;  Laterality: Left;  Excision Left Thigh Mass  . MASTECTOMY, PARTIAL  2008   GOT PACEMAKER AND DEFIB AT THAT TIME  . PACEMAKER INSERTION  04/23/06  . TOTAL KNEE ARTHROPLASTY  05/17/01   RIGHT KNEE  . TOTAL KNEE ARTHROPLASTY Left 11/29/2014   Procedure: TOTAL LEFT KNEE ARTHROPLASTY;  Surgeon: Paralee Cancel, MD;  Location: [facility] ORS;  Service: Orthopedics;  Laterality: Left;    MEDICATIONS: . acetaminophen (TYLENOL) 650 MG CR tablet  . ascorbic acid (VITAMIN C) 500 MG tablet  . buPROPion (WELLBUTRIN XL) 150 MG 24 hr tablet  . carvedilol (COREG) 3.125 MG tablet  . Cholecalciferol (VITAMIN D3) 50 MCG (2000 UT) TABS  . Cod Liver Oil CAPS  . furosemide (LASIX) 20 MG tablet  . levothyroxine (SYNTHROID, LEVOTHROID) 100 MCG tablet  . Magnesium 250 MG TABS  . NONFORMULARY OR COMPOUNDED ITEM  . omeprazole (PRILOSEC)  40 MG capsule  . Oxcarbazepine (TRILEPTAL) 300 MG tablet  . potassium chloride (K-DUR) 10 MEQ tablet  . sertraline (ZOLOFT) 100 MG tablet  . spironolactone (ALDACTONE) 25 MG tablet  . Vitamin E 400 units TABS   No current facility-administered medications for this encounter.   Myra Gianotti, PA-C Surgical Short Stay/Anesthesiology [facility] Phone [phone] [facility] Phone [phone] 06/02/2019 5:15 PM

## 2019-06-02 NOTE — Anesthesia Preprocedure Evaluation (Addendum)
Anesthesia Evaluation  Patient identified by MRN, date of birth, ID band Patient awake    Reviewed: Allergy & Precautions, NPO status , Patient's Chart, lab work & pertinent test results, reviewed documented beta blocker date and time   Airway Mallampati: I  TM Distance: >3 FB Neck ROM: Full    Dental no notable dental hx. (+) Dental Advisory Given, Teeth Intact   Pulmonary neg pulmonary ROS,    Pulmonary exam normal breath sounds clear to auscultation       Cardiovascular hypertension, Pt. on home beta blockers + Peripheral Vascular Disease and +CHF  Normal cardiovascular exam+ dysrhythmias + pacemaker + Cardiac Defibrillator  Rhythm:Regular Rate:Normal     Neuro/Psych  Headaches, PSYCHIATRIC DISORDERS Depression    GI/Hepatic Neg liver ROS, GERD  Medicated and Controlled,  Endo/Other  Hypothyroidism   Renal/GU negative Renal ROS     Musculoskeletal negative musculoskeletal ROS (+)   Abdominal   Peds  Hematology negative hematology ROS (+)   Anesthesia Other Findings RECURRENT LEFT BREAST CANCER  Reproductive/Obstetrics                         Anesthesia Physical Anesthesia Plan  ASA: III  Anesthesia Plan: General and Regional   Post-op Pain Management: GA combined w/ Regional for post-op pain   Induction: Intravenous  PONV Risk Score and Plan: 3 and Ondansetron, Dexamethasone and Treatment may vary due to age or medical condition  Airway Management Planned: LMA  Additional Equipment:   Intra-op Plan:   Post-operative Plan: Extubation in OR  Informed Consent: I have reviewed the patients History and Physical, chart, labs and discussed the procedure including the risks, benefits and alternatives for the proposed anesthesia with the patient or authorized representative who has indicated his/her understanding and acceptance.   Patient has DNR.  Discussed DNR with patient and  Continue DNR.   Dental advisory given  Plan Discussed with: CRNA  Anesthesia Plan Comments: (Reviewed PAT note written 06/02/2019 by Myra Gianotti, PA-C. Has [employer] ICD, left chest. Patient requests no chest compressions Patient requests no defibrillation )     Anesthesia Quick Evaluation

## 2019-06-03 NOTE — Progress Notes (Signed)
[employer] Rep. Heron Sabins [phone] to let him know that he will need to be here at [facility] on DOS (06/04/19).  Per Ebony Hail, [place] (who spoke with anesthesia), Joey needs to reprogram the device instead of using a magnet due to ICD being close to operative site.  Joey verified that he would be here for the 7:30 am surgery start time (patient to arrive at 5:30 am).

## 2019-06-04 ENCOUNTER — Encounter (HOSPITAL_COMMUNITY): Payer: Self-pay | Admitting: General Surgery

## 2019-06-04 ENCOUNTER — Ambulatory Visit (HOSPITAL_COMMUNITY)
Admission: RE | Admit: 2019-06-04 | Discharge: 2019-06-04 | Disposition: A | Discharge status: Home / Self Care | Payer: Medicare Other | Source: Ambulatory Visit | Attending: General Surgery | Admitting: General Surgery

## 2019-06-04 ENCOUNTER — Ambulatory Visit (HOSPITAL_COMMUNITY): Payer: Medicare Other | Admitting: Vascular Surgery

## 2019-06-04 ENCOUNTER — Ambulatory Visit (HOSPITAL_COMMUNITY)
Admission: RE | Admit: 2019-06-04 | Discharge: 2019-06-05 | Disposition: A | Discharge status: Home / Self Care | Payer: Medicare Other | Attending: General Surgery | Admitting: General Surgery

## 2019-06-04 ENCOUNTER — Ambulatory Visit (HOSPITAL_COMMUNITY): Payer: Medicare Other | Admitting: Anesthesiology

## 2019-06-04 ENCOUNTER — Encounter (HOSPITAL_COMMUNITY)
Admission: RE | Disposition: A | Discharge status: Home / Self Care | Payer: Self-pay | Source: Home / Self Care | Attending: General Surgery

## 2019-06-04 ENCOUNTER — Other Ambulatory Visit: Payer: Self-pay

## 2019-06-04 DIAGNOSIS — Z923 Personal history of irradiation: Secondary | ICD-10-CM | POA: Insufficient documentation

## 2019-06-04 DIAGNOSIS — F329 Major depressive disorder, single episode, unspecified: Secondary | ICD-10-CM | POA: Insufficient documentation

## 2019-06-04 DIAGNOSIS — Z95810 Presence of automatic (implantable) cardiac defibrillator: Secondary | ICD-10-CM | POA: Diagnosis not present

## 2019-06-04 DIAGNOSIS — Z886 Allergy status to analgesic agent status: Secondary | ICD-10-CM | POA: Diagnosis not present

## 2019-06-04 DIAGNOSIS — I5022 Chronic systolic (congestive) heart failure: Secondary | ICD-10-CM | POA: Insufficient documentation

## 2019-06-04 DIAGNOSIS — M545 Low back pain: Secondary | ICD-10-CM | POA: Diagnosis not present

## 2019-06-04 DIAGNOSIS — Z91013 Allergy to seafood: Secondary | ICD-10-CM | POA: Diagnosis not present

## 2019-06-04 DIAGNOSIS — E034 Atrophy of thyroid (acquired): Secondary | ICD-10-CM | POA: Insufficient documentation

## 2019-06-04 DIAGNOSIS — G8929 Other chronic pain: Secondary | ICD-10-CM | POA: Insufficient documentation

## 2019-06-04 DIAGNOSIS — Z96653 Presence of artificial knee joint, bilateral: Secondary | ICD-10-CM | POA: Diagnosis not present

## 2019-06-04 DIAGNOSIS — C50912 Malignant neoplasm of unspecified site of left female breast: Secondary | ICD-10-CM

## 2019-06-04 DIAGNOSIS — C50512 Malignant neoplasm of lower-outer quadrant of left female breast: Secondary | ICD-10-CM | POA: Diagnosis present

## 2019-06-04 DIAGNOSIS — Z79899 Other long term (current) drug therapy: Secondary | ICD-10-CM | POA: Diagnosis not present

## 2019-06-04 DIAGNOSIS — Z853 Personal history of malignant neoplasm of breast: Secondary | ICD-10-CM | POA: Insufficient documentation

## 2019-06-04 DIAGNOSIS — Z888 Allergy status to other drugs, medicaments and biological substances status: Secondary | ICD-10-CM | POA: Insufficient documentation

## 2019-06-04 DIAGNOSIS — I110 Hypertensive heart disease with heart failure: Secondary | ICD-10-CM | POA: Diagnosis not present

## 2019-06-04 DIAGNOSIS — M961 Postlaminectomy syndrome, not elsewhere classified: Secondary | ICD-10-CM | POA: Insufficient documentation

## 2019-06-04 DIAGNOSIS — Z171 Estrogen receptor negative status [ER-]: Secondary | ICD-10-CM | POA: Insufficient documentation

## 2019-06-04 DIAGNOSIS — I70209 Unspecified atherosclerosis of native arteries of extremities, unspecified extremity: Secondary | ICD-10-CM | POA: Insufficient documentation

## 2019-06-04 DIAGNOSIS — Z885 Allergy status to narcotic agent status: Secondary | ICD-10-CM | POA: Insufficient documentation

## 2019-06-04 HISTORY — PX: MASTECTOMY W/ SENTINEL NODE BIOPSY: SHX2001

## 2019-06-04 SURGERY — MASTECTOMY WITH SENTINEL LYMPH NODE BIOPSY
Anesthesia: Regional | Site: Breast | Laterality: Left

## 2019-06-04 MED ORDER — FENTANYL CITRATE (PF) 100 MCG/2ML IJ SOLN
INTRAMUSCULAR | Status: AC
  Filled 2019-06-04: qty 2

## 2019-06-04 MED ORDER — MIDAZOLAM HCL 2 MG/2ML IJ SOLN
INTRAMUSCULAR | Status: AC
  Filled 2019-06-04: qty 2

## 2019-06-04 MED ORDER — SPIRONOLACTONE 25 MG PO TABS
25.0000 mg | ORAL_TABLET | Freq: Every morning | ORAL | Status: DC
  Administered 2019-06-04 – 2019-06-05 (×2): 25 mg via ORAL
  Filled 2019-06-04 (×2): qty 1

## 2019-06-04 MED ORDER — TRAMADOL HCL 50 MG PO TABS
50.0000 mg | ORAL_TABLET | Freq: Four times a day (QID) | ORAL | Status: DC | PRN
  Administered 2019-06-04: 50 mg via ORAL
  Filled 2019-06-04: qty 1

## 2019-06-04 MED ORDER — STERILE WATER FOR IRRIGATION IR SOLN
Status: DC | PRN
  Administered 2019-06-04: 1000 mL

## 2019-06-04 MED ORDER — FENTANYL CITRATE (PF) 250 MCG/5ML IJ SOLN
INTRAMUSCULAR | Status: DC | PRN
  Administered 2019-06-04: 25 ug via INTRAVENOUS
  Administered 2019-06-04: 50 ug via INTRAVENOUS

## 2019-06-04 MED ORDER — ONDANSETRON HCL 4 MG/2ML IJ SOLN: 4.0000 mg | Freq: Once | INTRAMUSCULAR | Status: DC | PRN

## 2019-06-04 MED ORDER — CLONIDINE HCL (ANALGESIA) 100 MCG/ML EP SOLN
EPIDURAL | Status: DC | PRN
  Administered 2019-06-04: 100 ug

## 2019-06-04 MED ORDER — PHENYLEPHRINE 40 MCG/ML (10ML) SYRINGE FOR IV PUSH (FOR BLOOD PRESSURE SUPPORT)
PREFILLED_SYRINGE | INTRAVENOUS | Status: DC | PRN
  Administered 2019-06-04 (×2): 80 ug via INTRAVENOUS
  Administered 2019-06-04: 40 ug via INTRAVENOUS
  Administered 2019-06-04: 80 ug via INTRAVENOUS

## 2019-06-04 MED ORDER — CEFAZOLIN SODIUM-DEXTROSE 2-4 GM/100ML-% IV SOLN
2.0000 g | Freq: Three times a day (TID) | INTRAVENOUS | Status: AC
  Administered 2019-06-04: 2 g via INTRAVENOUS
  Filled 2019-06-04: qty 100

## 2019-06-04 MED ORDER — LIDOCAINE 2% (20 MG/ML) 5 ML SYRINGE
INTRAMUSCULAR | Status: AC
  Filled 2019-06-04: qty 5

## 2019-06-04 MED ORDER — ONDANSETRON HCL 4 MG/2ML IJ SOLN
INTRAMUSCULAR | Status: DC | PRN
  Administered 2019-06-04: 4 mg via INTRAVENOUS

## 2019-06-04 MED ORDER — OXCARBAZEPINE 300 MG PO TABS
300.0000 mg | ORAL_TABLET | Freq: Every day | ORAL | Status: DC
Start: 2019-06-05 — End: 2019-06-05
  Administered 2019-06-05: 10:00:00 300 mg via ORAL
  Filled 2019-06-04 (×2): qty 1

## 2019-06-04 MED ORDER — METHYLENE BLUE 0.5 % INJ SOLN
INTRAVENOUS | Status: AC
  Filled 2019-06-04: qty 10

## 2019-06-04 MED ORDER — ONDANSETRON HCL 4 MG/2ML IJ SOLN: 4.0000 mg | Freq: Four times a day (QID) | INTRAMUSCULAR | Status: DC | PRN

## 2019-06-04 MED ORDER — CEFAZOLIN SODIUM-DEXTROSE 2-4 GM/100ML-% IV SOLN
2.0000 g | INTRAVENOUS | Status: AC
  Administered 2019-06-04: 2 g via INTRAVENOUS
  Filled 2019-06-04: qty 100

## 2019-06-04 MED ORDER — CHLORHEXIDINE GLUCONATE CLOTH 2 % EX PADS: 6.0000 | MEDICATED_PAD | Freq: Once | CUTANEOUS | Status: DC

## 2019-06-04 MED ORDER — ROPIVACAINE HCL 7.5 MG/ML IJ SOLN
INTRAMUSCULAR | Status: DC | PRN
  Administered 2019-06-04: 20 mL via PERINEURAL

## 2019-06-04 MED ORDER — SENNA 8.6 MG PO TABS
1.0000 | ORAL_TABLET | Freq: Two times a day (BID) | ORAL | Status: DC
  Administered 2019-06-04 – 2019-06-05 (×2): 8.6 mg via ORAL
  Filled 2019-06-04 (×3): qty 1

## 2019-06-04 MED ORDER — METHOCARBAMOL 500 MG PO TABS
500.0000 mg | ORAL_TABLET | Freq: Four times a day (QID) | ORAL | Status: DC | PRN
  Administered 2019-06-04 – 2019-06-05 (×3): 500 mg via ORAL
  Filled 2019-06-04 (×3): qty 1

## 2019-06-04 MED ORDER — PHENYLEPHRINE HCL (PRESSORS) 10 MG/ML IV SOLN
INTRAVENOUS | Status: AC
  Filled 2019-06-04: qty 1

## 2019-06-04 MED ORDER — LACTATED RINGERS IV SOLN: INTRAVENOUS | Status: DC | PRN

## 2019-06-04 MED ORDER — PHENYLEPHRINE 40 MCG/ML (10ML) SYRINGE FOR IV PUSH (FOR BLOOD PRESSURE SUPPORT)
PREFILLED_SYRINGE | INTRAVENOUS | Status: AC
  Filled 2019-06-04: qty 10

## 2019-06-04 MED ORDER — FENTANYL CITRATE (PF) 250 MCG/5ML IJ SOLN
INTRAMUSCULAR | Status: AC
  Filled 2019-06-04: qty 5

## 2019-06-04 MED ORDER — DEXAMETHASONE SODIUM PHOSPHATE 10 MG/ML IJ SOLN
INTRAMUSCULAR | Status: DC | PRN
  Administered 2019-06-04: 10 mg via INTRAVENOUS

## 2019-06-04 MED ORDER — FUROSEMIDE 20 MG PO TABS
20.0000 mg | ORAL_TABLET | Freq: Every day | ORAL | Status: DC
  Administered 2019-06-04 – 2019-06-05 (×2): 20 mg via ORAL
  Filled 2019-06-04 (×2): qty 1

## 2019-06-04 MED ORDER — HYDRALAZINE HCL 10 MG PO TABS: 10.0000 mg | ORAL_TABLET | Freq: Four times a day (QID) | ORAL | Status: DC | PRN

## 2019-06-04 MED ORDER — POTASSIUM CHLORIDE CRYS ER 10 MEQ PO TBCR
10.0000 meq | EXTENDED_RELEASE_TABLET | Freq: Every day | ORAL | Status: DC
  Administered 2019-06-04 – 2019-06-05 (×2): 10 meq via ORAL
  Filled 2019-06-04 (×2): qty 1

## 2019-06-04 MED ORDER — EPHEDRINE SULFATE 50 MG/ML IJ SOLN: INTRAMUSCULAR | Status: DC | PRN

## 2019-06-04 MED ORDER — PROPOFOL 10 MG/ML IV BOLUS
INTRAVENOUS | Status: AC
  Filled 2019-06-04: qty 40

## 2019-06-04 MED ORDER — ONDANSETRON 4 MG PO TBDP: 4.0000 mg | ORAL_TABLET | Freq: Four times a day (QID) | ORAL | Status: DC | PRN

## 2019-06-04 MED ORDER — DIPHENHYDRAMINE HCL 50 MG/ML IJ SOLN: 12.5000 mg | Freq: Four times a day (QID) | INTRAMUSCULAR | Status: DC | PRN

## 2019-06-04 MED ORDER — 0.9 % SODIUM CHLORIDE (POUR BTL) OPTIME
TOPICAL | Status: DC | PRN
  Administered 2019-06-04: 1000 mL

## 2019-06-04 MED ORDER — SERTRALINE HCL 100 MG PO TABS
200.0000 mg | ORAL_TABLET | Freq: Every day | ORAL | Status: DC
  Administered 2019-06-04 – 2019-06-05 (×2): 200 mg via ORAL
  Filled 2019-06-04 (×2): qty 2

## 2019-06-04 MED ORDER — CARVEDILOL 3.125 MG PO TABS
3.1250 mg | ORAL_TABLET | Freq: Two times a day (BID) | ORAL | Status: DC
  Administered 2019-06-04 – 2019-06-05 (×2): 3.125 mg via ORAL
  Filled 2019-06-04 (×2): qty 1

## 2019-06-04 MED ORDER — EPHEDRINE SULFATE-NACL 50-0.9 MG/10ML-% IV SOSY
PREFILLED_SYRINGE | INTRAVENOUS | Status: DC | PRN
  Administered 2019-06-04 (×3): 10 mg via INTRAVENOUS
  Administered 2019-06-04: 5 mg via INTRAVENOUS

## 2019-06-04 MED ORDER — ENSURE PRE-SURGERY PO LIQD: 296.0000 mL | Freq: Once | ORAL | Status: DC

## 2019-06-04 MED ORDER — BUPROPION HCL ER (XL) 300 MG PO TB24
450.0000 mg | ORAL_TABLET | Freq: Every day | ORAL | Status: DC
  Administered 2019-06-05: 450 mg via ORAL
  Filled 2019-06-04: qty 1

## 2019-06-04 MED ORDER — DICLOFENAC SODIUM 1 % EX GEL
2.0000 g | Freq: Four times a day (QID) | CUTANEOUS | Status: DC
Start: 2019-06-04 — End: 2019-06-05
  Filled 2019-06-04: qty 100

## 2019-06-04 MED ORDER — ROCURONIUM BROMIDE 10 MG/ML (PF) SYRINGE
PREFILLED_SYRINGE | INTRAVENOUS | Status: AC
  Filled 2019-06-04: qty 10

## 2019-06-04 MED ORDER — PHENYLEPHRINE HCL-NACL 10-0.9 MG/250ML-% IV SOLN
INTRAVENOUS | Status: DC | PRN
  Administered 2019-06-04: 30 ug/min via INTRAVENOUS

## 2019-06-04 MED ORDER — EPHEDRINE 5 MG/ML INJ
INTRAVENOUS | Status: AC
  Filled 2019-06-04: qty 10

## 2019-06-04 MED ORDER — SODIUM CHLORIDE (PF) 0.9 % IJ SOLN
INTRAMUSCULAR | Status: AC
  Filled 2019-06-04: qty 10

## 2019-06-04 MED ORDER — ACETAMINOPHEN 500 MG PO TABS
500.0000 mg | ORAL_TABLET | Freq: Three times a day (TID) | ORAL | Status: DC | PRN
Start: 2019-06-04 — End: 2019-06-05
  Administered 2019-06-04 – 2019-06-05 (×2): 500 mg via ORAL
  Filled 2019-06-04 (×2): qty 1

## 2019-06-04 MED ORDER — PROPOFOL 10 MG/ML IV BOLUS
INTRAVENOUS | Status: DC | PRN
  Administered 2019-06-04: 150 mg via INTRAVENOUS

## 2019-06-04 MED ORDER — DEXAMETHASONE SODIUM PHOSPHATE 10 MG/ML IJ SOLN
INTRAMUSCULAR | Status: AC
  Filled 2019-06-04: qty 1

## 2019-06-04 MED ORDER — KCL IN DEXTROSE-NACL 20-5-0.45 MEQ/L-%-% IV SOLN
INTRAVENOUS | Status: AC
  Filled 2019-06-04: qty 1000

## 2019-06-04 MED ORDER — FENTANYL CITRATE (PF) 100 MCG/2ML IJ SOLN: 25.0000 ug | INTRAMUSCULAR | Status: DC | PRN

## 2019-06-04 MED ORDER — PHENYLEPHRINE HCL (PRESSORS) 10 MG/ML IV SOLN: INTRAVENOUS | Status: DC | PRN

## 2019-06-04 MED ORDER — ACETAMINOPHEN 500 MG PO TABS
1000.0000 mg | ORAL_TABLET | ORAL | Status: AC
  Administered 2019-06-04: 1000 mg via ORAL
  Filled 2019-06-04: qty 2

## 2019-06-04 MED ORDER — OXYCODONE HCL 5 MG PO TABS
2.5000 mg | ORAL_TABLET | Freq: Four times a day (QID) | ORAL | Status: DC | PRN
  Filled 2019-06-04: qty 1

## 2019-06-04 MED ORDER — LEVOTHYROXINE SODIUM 100 MCG PO TABS
100.0000 ug | ORAL_TABLET | Freq: Every day | ORAL | Status: DC
  Administered 2019-06-05: 100 ug via ORAL
  Filled 2019-06-04: qty 1

## 2019-06-04 MED ORDER — TECHNETIUM TC 99M SULFUR COLLOID FILTERED
1.0000 | Freq: Once | INTRAVENOUS | Status: AC | PRN
  Administered 2019-06-04: 1 via INTRADERMAL

## 2019-06-04 MED ORDER — MAGNESIUM OXIDE 400 (241.3 MG) MG PO TABS
200.0000 mg | ORAL_TABLET | Freq: Every day | ORAL | Status: DC
  Administered 2019-06-04 – 2019-06-05 (×2): 200 mg via ORAL
  Filled 2019-06-04 (×2): qty 1

## 2019-06-04 MED ORDER — DIPHENHYDRAMINE HCL 12.5 MG/5ML PO ELIX: 12.5000 mg | ORAL_SOLUTION | Freq: Four times a day (QID) | ORAL | Status: DC | PRN

## 2019-06-04 MED ORDER — ONDANSETRON HCL 4 MG/2ML IJ SOLN
INTRAMUSCULAR | Status: AC
  Filled 2019-06-04: qty 2

## 2019-06-04 MED ORDER — PANTOPRAZOLE SODIUM 40 MG PO TBEC
40.0000 mg | DELAYED_RELEASE_TABLET | Freq: Every day | ORAL | Status: DC
  Administered 2019-06-05: 40 mg via ORAL
  Filled 2019-06-04 (×2): qty 1

## 2019-06-04 SURGICAL SUPPLY — 54 items
BINDER BREAST LRG (GAUZE/BANDAGES/DRESSINGS) ×2 IMPLANT
BIOPATCH RED 1 DISK 7.0 (GAUZE/BANDAGES/DRESSINGS) ×4 IMPLANT
BNDG COHESIVE 4X5 TAN STRL (GAUZE/BANDAGES/DRESSINGS) ×2 IMPLANT
CANISTER SUCT 3000ML PPV (MISCELLANEOUS) ×2 IMPLANT
CHLORAPREP W/TINT 26 (MISCELLANEOUS) ×2 IMPLANT
CLIP VESOCCLUDE LG 6/CT (CLIP) ×2 IMPLANT
CLIP VESOCCLUDE MED 6/CT (CLIP) ×2 IMPLANT
CLIP VESOCCLUDE SM WIDE 6/CT (CLIP) ×2 IMPLANT
CNTNR URN SCR LID CUP LEK RST (MISCELLANEOUS) ×2 IMPLANT
CONT SPEC 4OZ STRL OR WHT (MISCELLANEOUS) ×2
COVER PROBE W GEL 5X96 (DRAPES) ×2 IMPLANT
COVER SURGICAL LIGHT HANDLE (MISCELLANEOUS) ×2 IMPLANT
DERMABOND ADVANCED (GAUZE/BANDAGES/DRESSINGS) ×1
DERMABOND ADVANCED .7 DNX12 (GAUZE/BANDAGES/DRESSINGS) ×1 IMPLANT
DRAIN CHANNEL 19F RND (DRAIN) ×2 IMPLANT
DRSG TEGADERM 4X4.75 (GAUZE/BANDAGES/DRESSINGS) ×4 IMPLANT
ELECT BLADE 4.0 EZ CLEAN MEGAD (MISCELLANEOUS) ×2
ELECT CAUTERY BLADE 6.4 (BLADE) ×2 IMPLANT
ELECT REM PT RETURN 9FT ADLT (ELECTROSURGICAL) ×2
ELECTRODE BLDE 4.0 EZ CLN MEGD (MISCELLANEOUS) ×1 IMPLANT
ELECTRODE REM PT RTRN 9FT ADLT (ELECTROSURGICAL) ×1 IMPLANT
EVACUATOR SILICONE 100CC (DRAIN) ×2 IMPLANT
GLOVE BIO SURGEON STRL SZ 6 (GLOVE) ×2 IMPLANT
GLOVE BIO SURGEON STRL SZ 6.5 (GLOVE) ×2 IMPLANT
GLOVE BIOGEL PI IND STRL 7.5 (GLOVE) ×1 IMPLANT
GLOVE BIOGEL PI INDICATOR 7.5 (GLOVE) ×1
GLOVE ECLIPSE 7.5 STRL STRAW (GLOVE) ×4 IMPLANT
GLOVE INDICATOR 6.5 STRL GRN (GLOVE) ×2 IMPLANT
GLOVE SURG SS PI 6.5 STRL IVOR (GLOVE) ×2 IMPLANT
GOWN STRL REUS W/ TWL LRG LVL3 (GOWN DISPOSABLE) ×3 IMPLANT
GOWN STRL REUS W/TWL 2XL LVL3 (GOWN DISPOSABLE) ×2 IMPLANT
GOWN STRL REUS W/TWL LRG LVL3 (GOWN DISPOSABLE) ×3
KIT BASIN OR (CUSTOM PROCEDURE TRAY) ×2 IMPLANT
KIT TURNOVER KIT B (KITS) ×2 IMPLANT
LIGHT WAVEGUIDE WIDE FLAT (MISCELLANEOUS) ×2 IMPLANT
MARKER SKIN DUAL TIP RULER LAB (MISCELLANEOUS) ×2 IMPLANT
NS IRRIG 1000ML POUR BTL (IV SOLUTION) ×2 IMPLANT
PACK GENERAL/GYN (CUSTOM PROCEDURE TRAY) ×2 IMPLANT
PACK UNIVERSAL I (CUSTOM PROCEDURE TRAY) ×2 IMPLANT
PAD ABD 8X10 STRL (GAUZE/BANDAGES/DRESSINGS) ×2 IMPLANT
PAD ARMBOARD 7.5X6 YLW CONV (MISCELLANEOUS) ×2 IMPLANT
PENCIL SMOKE EVACUATOR (MISCELLANEOUS) ×2 IMPLANT
SLEEVE SUCTION 125 (MISCELLANEOUS) ×2 IMPLANT
STOCKINETTE IMPERVIOUS 9X36 MD (GAUZE/BANDAGES/DRESSINGS) ×2 IMPLANT
STRIP CLOSURE SKIN 1/2X4 (GAUZE/BANDAGES/DRESSINGS) ×2 IMPLANT
SUT ETHILON 2 0 FS 18 (SUTURE) ×2 IMPLANT
SUT MON AB 4-0 PC3 18 (SUTURE) ×2 IMPLANT
SUT SILK 2 0 (SUTURE) ×1
SUT SILK 2 0 PERMA HAND 18 BK (SUTURE) ×2 IMPLANT
SUT SILK 2 0 SH (SUTURE) ×2 IMPLANT
SUT SILK 2-0 18XBRD TIE 12 (SUTURE) ×1 IMPLANT
SUT VIC AB 3-0 SH 8-18 (SUTURE) ×2 IMPLANT
TOWEL GREEN STERILE (TOWEL DISPOSABLE) ×2 IMPLANT
TOWEL GREEN STERILE FF (TOWEL DISPOSABLE) ×2 IMPLANT

## 2019-06-04 NOTE — Discharge Instructions (Signed)
[facility] [place] °[phone] ° °MASTECTOMY: POST OP INSTRUCTIONS ° °Always review your discharge instruction sheet given to you by the facility where your surgery was performed. °IF YOU HAVE DISABILITY OR FAMILY LEAVE FORMS, YOU MUST BRING THEM TO THE OFFICE FOR PROCESSING.   °DO NOT GIVE THEM TO YOUR DOCTOR. °A prescription for pain medication may be given to you upon discharge.  Take your pain medication as prescribed, if needed.  If narcotic pain medicine is not needed, then you may take acetaminophen (Tylenol) or ibuprofen (Advil) as needed. °1. Take your usually prescribed medications unless otherwise directed. °2. If you need a refill on your pain medication, please contact your pharmacy.  They will contact our office to request authorization.  Prescriptions will not be filled after 5pm or on week-ends. °3. You should follow a light diet the first few days after arrival home, such as soup and crackers, etc.  Resume your normal diet the day after surgery. °4. Most patients will experience some swelling and bruising on the chest and underarm.  Ice packs will help.  Swelling and bruising can take several days to resolve.  °5. It is common to experience some constipation if taking pain medication after surgery.  Increasing fluid intake and taking a stool softener (such as Colace) will usually help or prevent this problem from occurring.  A mild laxative (Milk of Magnesia or Miralax) should be taken according to package instructions if there are no bowel movements after 48 hours. °6. Unless discharge instructions indicate otherwise, leave your bandage dry and in place until your next appointment in 3-5 days.  You may take a limited sponge bath.  No tube baths or showers until the drains are removed.  You may have steri-strips (small skin tapes) in place directly over the incision.  These strips should be left on the skin for 7-10 days.  If your surgeon used skin glue on the incision, you may  shower in 24 hours.  The glue will flake off over the next 2-3 weeks.  Any sutures or staples will be removed at the office during your follow-up visit. °7. DRAINS:  If you have drains in place, it is important to keep a list of the amount of drainage produced each day in your drains.  Before leaving the hospital, you should be instructed on drain care.  Call our office if you have any questions about your drains. °8. ACTIVITIES:  You may resume regular (light) daily activities beginning the next day--such as daily self-care, walking, climbing stairs--gradually increasing activities as tolerated.  You may have sexual intercourse when it is comfortable.  Refrain from any heavy lifting or straining until approved by your doctor. °a. You may drive when you are no longer taking prescription pain medication, you can comfortably wear a seatbelt, and you can safely maneuver your car and apply brakes. °b. RETURN TO WORK:  __________________________________________________________ °9. You should see your doctor in the office for a follow-up appointment approximately 3-5 days after your surgery.  Your doctor’s nurse will typically make your follow-up appointment when she calls you with your pathology report.  Expect your pathology report 2-3 business days after your surgery.  You may call to check if you do not hear from us after three days.   °10. OTHER INSTRUCTIONS: ______________________________________________________________________________________________ ____________________________________________________________________________________________ ° °WHEN TO CALL YOUR DOCTOR: °1. Fever over 101.0 °2. Nausea and/or vomiting °3. Extreme swelling or bruising °4. Continued bleeding from incision. °5. Increased pain, redness, or drainage from the   The clinic staff is available to answer your questions during regular business hours.  Please don't hesitate to call and ask to speak to one of the nurses for clinical  concerns.  If you have a medical emergency, go to the nearest emergency room or call 911.  A surgeon from [facility] is always on call at the hospital. [address] [place], [place], [place]  [address] ? [address], [place], [place]   [address] [phone] ? [phone] ? FAX [phone]     [facility] drains are used to remove extra fluid that normally builds up in a surgical wound after surgery. A surgical drain helps to heal a surgical wound. Different kinds of surgical drains include:  Active drains. These drains use suction to pull drainage away from the surgical wound. Drainage flows through a tube to a container outside of the body. With these drains, you need to keep the bulb or the drainage container flat (compressed) at all times, except while you empty it. Flattening the bulb or container creates suction.  Passive drains. These drains allow fluid to drain naturally, by gravity. Drainage flows through a tube to a bandage (dressing) or a container outside of the body. Passive drains do not need to be emptied. A drain is placed during surgery. Right after surgery, drainage is usually bright red and a little thicker than water. The drainage may gradually turn yellow or pink and become thinner. It is likely that your health care provider will remove the drain when the drainage stops or when the amount decreases to 1-2 Tbsp (15-30 mL) during a 24-hour period. Supplies needed:  Tape.  Germ-free cleaning solution (sterile saline).  Cotton swabs.  Split gauze drain sponge: 4 x 4 inches (10 x 10 cm).  Gauze square: 4 x 4 inches (10 x 10 cm). How to care for your surgical drain Care for your drain as told by your health care provider. This is important to help prevent infection. If your drain is placed at your back, or any other hard-to-reach area, ask another person to assist you in performing the following tasks: General care  Keep  the skin around the drain dry and covered with a dressing at all times.  Check your drain area every day for signs of infection. Check for: ? Redness, swelling, or pain. ? Pus or a bad smell. ? Cloudy drainage. ? Tenderness or pressure at the drain exit site. Changing the dressing Follow instructions from your health care provider about how to change your dressing. Change your dressing at least once a day. Change it more often if needed to keep the dressing dry. Make sure you: 1. Gather your supplies. 2. Wash your hands with soap and water before you change your dressing. If soap and water are not available, use hand sanitizer. 3. Remove the old dressing. Avoid using scissors to do that. 4. Wash your hands with soap and water again after removing the old dressing. 5. Use sterile saline to clean your skin around the drain. You may need to use a cotton swab to clean the skin. 6. Place the tube through the slit in a drain sponge. Place the drain sponge so that it covers your wound. 7. Place the gauze square or another drain sponge on top of the drain sponge that is on the wound. Make sure the tube is between those layers. 8. Tape the dressing to your skin. 9. Tape the drainage tube to  skin 1-2 inches (2.5-5 cm) below the place where the tube enters your body. Taping keeps the tube from pulling on any stitches (sutures) that you have. 10. Wash your hands with soap and water. 11. Write down the color of your drainage and how often you change your dressing. How to empty your active drain  1. Make sure that you have a measuring cup that you can empty your drainage into. 2. Wash your hands with soap and water. If soap and water are not available, use hand sanitizer. 3. Loosen any pins or clips that hold the tube in place. 4. If your health care provider tells you to strip the tube to prevent clots and tube blockages: ? Hold the tube at the skin with one hand. Use your other hand to pinch the  tubing with your thumb and first finger. ? Gently move your fingers down the tube while squeezing very lightly. This clears any drainage, clots, or tissue from the tube. ? You may need to do this several times each day to keep the tube clear. Do not pull on the tube. 5. Open the bulb cap or the drain plug. Do not touch the inside of the cap or the bottom of the plug. 6. Turn the device upside down and gently squeeze. 7. Empty all of the drainage into the measuring cup. 8. Compress the bulb or the container and replace the cap or the plug. To compress the bulb or the container, squeeze it firmly in the middle while you close the cap or plug the container. 9. Write down the amount of drainage that you have in each 24-hour period. If you have less than 2 Tbsp (30 mL) of drainage during 24 hours, contact your health care provider. 10. Flush the drainage down the toilet. 11. Wash your hands with soap and water. Contact a health care provider if:  You have redness, swelling, or pain around your drain area.  You have pus or a bad smell coming from your drain area.  You have a fever or chills.  The skin around your drain is warm to the touch.  The amount of drainage that you have is increasing instead of decreasing.  You have drainage that is cloudy.  There is a sudden stop or a sudden decrease in the amount of drainage that you have.  Your drain tube falls out.  Your active drain does not stay compressed after you empty it. Summary  Surgical drains are used to remove extra fluid that normally builds up in a surgical wound after surgery.  Different kinds of surgical drains include active drains and passive drains. Active drains use suction to pull drainage away from the surgical wound, and passive drains allow fluid to drain naturally.  It is important to care for your drain to prevent infection. If your drain is placed at your back, or any other hard-to-reach area, ask another person to  assist you.  Contact your health care provider if you have redness, swelling, or pain around your drain area. This information is not intended to replace advice given to you by your health care provider. Make sure you discuss any questions you have with your health care provider. Document Revised: 04/30/2018 Document Reviewed: 04/30/2018 Elsevier Patient Education  2020 [employer]   

## 2019-06-04 NOTE — Plan of Care (Signed)
  Problem: Education: Goal: Knowledge of General Education information will improve Description Including pain rating scale, medication(s)/side effects and non-pharmacologic comfort measures Outcome: Progressing   

## 2019-06-04 NOTE — H&P (Signed)
[name] Documented: 05/11/2019 9:37 AM Location: [facility] Patient #: [id number] DOB: [birth year] Married / Language: English / Race: Black or African American Female   History of Present Illness Stacey Klein MD; 05/11/2019 10:31 AM) The patient is a 86 year old female who presents for a follow-up for Breast cancer. Pt is a 86 yo F who underwent left breast cancer surgery with Dr. Bubba Camp in 2008. She had a T1bNxMx cancer that was triple negative. She has had issues wtih breast pain since I started following her. I took her back for excision of a recurrent seroma in 2011 that had concerning appearance on imaging. This was benign, and did help her breast pain just a little. I have advised her to do massage and heating pad. She has continued that and has had some improvement but continues to have left breast pain. She also continues to have knee and back pain. She is s/p bilateral knee replacements. She is s/p excision of left thigh lipoma. She still has some occasional discomfort there and occasional sensation of swelling.   Unfortunately, she developed recurrent breast cancer on the left January 2021. She had a screening detected mass in the LOQ of the left breast near the prior lumpectomy cavity. 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   dx mammogram 04/30/19 EXAM: DIGITAL DIAGNOSTIC LEFT MAMMOGRAM WITH CAD AND TOMO  ULTRASOUND LEFT BREAST  COMPARISON: Previous exam(s).  ACR Breast Density Category b: There are scattered areas of fibroglandular density.  FINDINGS: Distortion in the lateral central left breast correlates with the site of previous surgery. Dystrophic calcifications are seen in the left breast. No other mammographic abnormalities are noted.  Mammographic images were processed with CAD.  On physical exam, no suspicious lumps are identified.  Targeted  ultrasound is performed, showing both scarring and a seroma from previous surgery. There is a mass adjacent to the seroma at 3:30, 8 cm from the nipple measuring 10 x 10 x 16 mm. No axillary adenopathy.  IMPRESSION: Indeterminate left breast mass at 3:30, 8 cm from the nipple.  RECOMMENDATION: Ultrasound-guided biopsy of the 3:30 left breast mass.  I have discussed the findings and recommendations with the patient. If applicable, a reminder letter will be sent to the patient regarding the next appointment.  BI-RADS CATEGORY 4: Suspicious.  pathology 05/01/2019 Breast, left, needle core biopsy, 3:30 o'clock - INVASIVE DUCTAL CARCINOMA Based on the biopsy, the carcinoma appears Nottingham grade 3 of 3 and measures 1.4 cm in greatest linear extent. Estrogen Receptor: 0%, NEGATIVE Progesterone Receptor: 0%, NEGATIVE Proliferation Marker Ki67: 60% GROUP 5: HER2 **NEGATIVE**   Allergies ([name], CMA; 05/11/2019 9:38 AM) Codeine/Codeine Derivatives  Aspirin-Caff-Dihydrocodeine  Shellfish  Allergies Reconciled   Medication History ([name], CMA; 05/11/2019 9:39 AM) Donepezil HCl ('5MG'$  Tablet, Oral) Active. Vitamin D (1000UNIT Capsule, Oral) Active. Magnesium Oxide ('400MG'$  Tablet, Oral) Active. Vitamin E (1000UNIT Capsule, Oral) Active. Benicar ('40MG'$  Tablet, Oral) Active. BuPROPion HCl ER (XL) ('150MG'$  Tablet ER 24HR, Oral) Active. Furosemide ('40MG'$  Tablet, Oral) Active. Omeprazole ('40MG'$  Capsule DR, Oral) Active. Potassium Chloride ER (10MEQ Tablet ER, Oral) Active. Sertraline HCl ('100MG'$  Tablet, Oral) Active. Spironolactone ('25MG'$  Tablet, Oral) Active. Carvedilol (3.'125MG'$  Tablet, Oral) Active. Medications Reconciled    Review of Systems Stacey Klein MD; 05/11/2019 10:28 AM) All other systems negative  Vitals ([name] CMA; 05/11/2019 9:39 AM) 05/11/2019 9:39 AM Weight: 121.38 lb Height: 60in Body Surface Area:  1.51 m Body Mass Index: 23.7 kg/m   Temp.: 97.38F  Pulse: 86 (Regular)  BP: 130/62 (Sitting, Left Arm, Standard)       Physical Exam Stacey Klein MD; 05/11/2019 10:30 AM) General Mental Status-Alert. General Appearance-Consistent with stated age. Hydration-Well hydrated. Voice-Normal.  Integumentary Note: No recurrence of left thigh lipoma. Some excessive skin.   Head and Neck Head-normocephalic, atraumatic with no lesions or palpable masses.  Eye Sclera/Conjunctiva - Bilateral-No scleral icterus.  Chest and Lung Exam Chest and lung exam reveals -quiet, even and easy respiratory effort with no use of accessory muscles. Inspection Chest Wall - Normal. Back - normal.  Breast Note: No appreciable change in exam. continues to have relatively dense breast tissue. Still has thickening and retraction of left breast is improving a little. The massage has helped the lateral skin not stick down to the chest wall. No palpable masses or LAD. no nipple retraction or nipple discharge. Right breast feels normal and unchanged without concerning exam findings.   Cardiovascular Cardiovascular examination reveals -normal pedal pulses bilaterally. Note: regular rate and rhythm  Abdomen Inspection-Inspection Normal. Palpation/Percussion Palpation and Percussion of the abdomen reveal - Soft, Non Tender, No Rebound tenderness, No Rigidity (guarding) and No hepatosplenomegaly.  Peripheral Vascular Upper Extremity Inspection - Bilateral - Normal - No Clubbing, No Cyanosis, No Edema, Pulses Intact. Lower Extremity Palpation - Edema - Bilateral - No edema - Bilateral.  Neurologic Neurologic evaluation reveals -alert and oriented x 3 with no impairment of recent or remote memory. Mental Status-Normal.  Musculoskeletal Global Assessment -Note: no gross deformities.  Normal Exam - Left-Upper Extremity Strength Normal and Lower Extremity Strength Normal. Normal Exam - Right-Upper  Extremity Strength Normal and Lower Extremity Strength Normal.  Lymphatic Head & Neck  General Head & Neck Lymphatics: Bilateral - Description - Normal. Axillary  General Axillary Region: Bilateral - Description - Normal. Tenderness - Non Tender.    Assessment & Plan Stacey Klein MD; 05/11/2019 10:37 AM) RECURRENT BREAST CANCER, LEFT (C50.912) Impression: Pt has recurrent left breast cancer and will require mastectomy. This is triple negative again. We reviewed her in multidisciplinary conference and I contacted her oncologist. Given the triple negative status, we would typically place a port and give adjuvant chemo again. However, she is 51 and a bit more frail than last time. Dr. Irene Limbo would like to see her and review final pathology prior to determining if he would advise any chemo. He would likely give abbreviated course due to age if anything.  I discussed risks and expectations of recovery. I advised of a 1% risk of significant bleeding, as well as issues with the incision like dehiscence or infection. I also discussed her chronic left breast pain. This may improve after mastectomy, but it may not.  I advised that she would need to stay overnight and would have a drain. I discussed that sometimes we cannot adequately find lymph nodes and may need to take the remainder if she doesn't map.  I discussed reconstruction. She is interested in finding out about it, but she isn't sure she will want it. I don't know if they would offer immediate recon given her prior XRT.  Her daughter was present by phone. Questions were answered.  We discussed what is done for COVID prevention and testing.  I advised to go ahead and get vaccine pre op if she can. Current Plans You are being scheduled for surgery- Our schedulers will call you.  You should hear from our office's scheduling department  within 5 working days about the location, date, and time of surgery. We try to make accommodations for  patient's preferences in scheduling surgery, but sometimes the OR schedule or the surgeon's schedule prevents [place] from making those accommodations.  If you have not heard from our office [phone]) in 5 working days, call the office and ask for your surgeon's nurse.  If you have other questions about your diagnosis, plan, or surgery, call the office and ask for your surgeon's nurse.  Referred to Surgery - Plastic, for evaluation and follow up (Plastic Surgery). Routine. Pt Education - [facility] Mastectomy HCI AICD (AUTOMATIC CARDIOVERTER/DEFIBRILLATOR) PRESENT (Z95.810) Impression: This will need to be addressed pre op. Anesthesia will need them to briefly turn off the defibrillator function. CHRONIC SYSTOLIC HEART FAILURE (C09.19) Impression: I have contacted her cardiologist, Dr. Lovena Le, to see if she needs any preoperative testing or appointment prior to surgery.    Signed electronically by Stacey Klein, MD (05/11/2019 10:38 AM)

## 2019-06-04 NOTE — Anesthesia Postprocedure Evaluation (Signed)
Anesthesia Post Note  Patient: [employer]  Procedure(s) Performed: LEFT MASTECTOMY WITH SENTINEL LYMPH NODE BIOPSY (Left Breast)     Patient location during evaluation: PACU Anesthesia Type: Regional and General Level of consciousness: awake and alert Pain management: pain level controlled Vital Signs Assessment: post-procedure vital signs reviewed and stable Respiratory status: spontaneous breathing, nonlabored ventilation, respiratory function stable and patient connected to nasal cannula oxygen Cardiovascular status: blood pressure returned to baseline and stable Postop Assessment: no apparent nausea or vomiting Anesthetic complications: no    Last Vitals:  Vitals:   06/04/19 1244 06/04/19 1454  BP: 129/70 115/65  Pulse: 64 65  Resp: 16 17  Temp: 36.6 C (!) 36.4 C  SpO2: 97% 99%    Last Pain:  Vitals:   06/04/19 1454  TempSrc: Oral  PainSc:                  Jawuan Robb P Imani Sherrin

## 2019-06-04 NOTE — Op Note (Signed)
Left Mastectomy with Sentinel Node Biopsy Procedure Note  Indications: This patient presents with history of prior triple negative left breast cancer in 2008 tx with BCT/XRT/chemo and now with recurrent left breast cancer  Pre-operative Diagnosis: left breast cancer, cT1cN0M0, LOQ quadrant, receptors triple negative  Post-operative Diagnosis: same  Surgeon: [name]   Assistant:  Judyann Munson, RNFA  Anesthesia: General endotracheal anesthesia and pectoral block  ASA Class: 3  Procedure Details  The patient was seen in the Holding Room. The risks, benefits, complications, treatment options, and expected outcomes were discussed with the patient. The possibilities of reaction to medication, pulmonary aspiration, bleeding, infection, the need for additional procedures, failure to diagnose a condition, and creating a complication requiring transfusion or operation were discussed with the patient. The patient concurred with the proposed plan, giving informed consent.  The site of surgery properly noted/marked. The patient was taken to Operating Room # 2, identified as [name] and the procedure verified as left Mastectomy and Sentinel Node Biopsy. A Time Out was held and the above information confirmed.   After induction of anesthesia, the left arm, breast, and chest were prepped and draped in standard fashion.  The borders of the breast were identified and marked.  The incision was drawn out and measured to make sure superior and inferior incision lines were equidistant in length.  Prior breast incisions were incorporated into the incision  The incision was made with the #10 blade.  Mastectomy hooks were used to provide elevation of the skin edges, and the cautery was used to create the mastectomy flaps.  The left chest wall had an AICD.  The dissection was taken to the fascia of the pectoralis major except in the location of the AICD.  The breast tissue was taken off the anterior surface  of the AICD.  The penetrating vessels were clipped.  The superior flap was taken medially to the lateral sternal border, superiorly to the inferior border of the clavicle.    Laterally, care was taken to include the prior seroma cavity and the scar tissue along with some of the pectoralis/serratus underneath.  The inferior flap was similarly created, inferiorly to the inframammary fold and laterally to the border of the latissimus.  The breast was taken off including the pectoralis fascia and the axillary tail marked.    Using a hand-held gamma probe, axillary sentinel nodes were identified.  Two deep level 2 axillary sentinel nodes were removed with some adjacent scar tissue and submitted to pathology.  The findings are below.  The lymphovascular channels were clipped with metal clips.        The wound was irrigated. One 19 Blake drain was placed laterally and secured with a 2-0 nylon.   Hemostasis was achieved with cautery.  The wound was irrigated and closed with a 3-0 Vicryl deep dermal interrupted sutures and 4-0 Vicryl subcuticular closure in layers.    Sterile dressings were applied. At the end of the operation, all sponge, instrument, and needle counts were correct.  Findings: grossly clear surgical margins, SLN #1 hot, cps 1200, SLN #2 cps 45, background 4  Estimated Blood Loss: 50 mL          Drains: 19 Fr blake drain in left axilla                Specimens: left breast and two axillary sentinel nodes         Complications:  None; patient tolerated the procedure well.  Disposition: PACU - hemodynamically stable.         Condition: stable

## 2019-06-04 NOTE — Anesthesia Procedure Notes (Signed)
Anesthesia Regional Block: Pectoralis block   Pre-Anesthetic Checklist: ,, timeout performed, Correct Patient, Correct Site, Correct Laterality, Correct Procedure, Correct Position, site marked, Risks and benefits discussed,  Surgical consent,  Pre-op evaluation,  At surgeon's request and post-op pain management  Laterality: Left  Prep: chloraprep       Needles:  Injection technique: Single-shot  Needle Type: Echogenic Stimulator Needle     Needle Length: 9cm  Needle Gauge: 21     Additional Needles:   Procedures:,,,, ultrasound used (permanent image in chart),,,,  Narrative:  Start time: 06/04/2019 7:10 AM End time: 06/04/2019 7:20 AM Injection made incrementally with aspirations every 5 mL.  Performed by: Personally  Anesthesiologist: Murvin Natal, MD  Additional Notes: Functioning IV was confirmed and monitors were applied.  A timeout was performed. Sterile prep, hand hygiene and sterile gloves were used. A 31mm 21ga Arrow echogenic stimulator needle was used. Negative aspiration and negative test dose prior to incremental administration of local anesthetic. The patient tolerated the procedure well.  Ultrasound guidance: relevent anatomy identified, needle position confirmed, local anesthetic spread visualized around nerve(s), vascular puncture avoided.  Image printed for medical record.

## 2019-06-04 NOTE — Transfer of Care (Signed)
Immediate Anesthesia Transfer of Care Note  Patient: [employer]  Procedure(s) Performed: LEFT MASTECTOMY WITH SENTINEL LYMPH NODE BIOPSY (Left Breast)  Patient Location: PACU  Anesthesia Type:General and Regional  Level of Consciousness: awake and drowsy  Airway & Oxygen Therapy: Patient Spontanous Breathing and Patient connected to face mask oxygen  Post-op Assessment: Report given to RN, Post -op Vital signs reviewed and stable and Patient moving all extremities X 4  Post vital signs: Reviewed and stable  Last Vitals:  Vitals Value Taken Time  BP 135/74 06/04/19 1020  Temp    Pulse    Resp 8 06/04/19 1020  SpO2    Vitals shown include unvalidated device data.  Last Pain:  Vitals:   06/04/19 0614  PainSc: 5       Patients Stated Pain Goal: 2 ([id number])  Complications: No apparent anesthesia complications

## 2019-06-04 NOTE — Progress Notes (Signed)
Multiple attempts to reach nuclear medicine with no success.  Will continue to attempt.

## 2019-06-04 NOTE — Progress Notes (Signed)
Blossom, called and made aware of pt surgery time [phone]) and need for him to be present d/t pt ICD is close to operative site. Per Heron Sabins, he will be here ~0710.

## 2019-06-04 NOTE — Interval H&P Note (Signed)
History and Physical Interval Note:  06/04/2019 7:46 AM  [facility]  has presented today for surgery, with the diagnosis of RECURRENT LEFT BREAST CANCER.  The various methods of treatment have been discussed with the patient and family. After consideration of risks, benefits and other options for treatment, the patient has consented to  Procedure(s): LEFT MASTECTOMY WITH SENTINEL LYMPH NODE BIOPSY (Left) as a surgical intervention.  The patient's history has been reviewed, patient examined, no change in status, stable for surgery.  I have reviewed the patient's chart and labs.  Questions were answered to the patient's satisfaction.     [name]

## 2019-06-04 NOTE — Anesthesia Procedure Notes (Addendum)
Procedure Name: LMA Insertion Date/Time: 06/04/2019 8:15 AM Performed by: Harden Mo, CRNA Pre-anesthesia Checklist: Patient identified, Emergency Drugs available, Suction available and Patient being monitored Patient Re-evaluated:Patient Re-evaluated prior to induction Oxygen Delivery Method: Circle System Utilized Preoxygenation: Pre-oxygenation with 100% oxygen Induction Type: IV induction LMA: LMA inserted LMA Size: 4.0 Number of attempts: 1 Airway Equipment and Method: Bite block Placement Confirmation: positive ETCO2 Tube secured with: Tape Dental Injury: Teeth and Oropharynx as per pre-operative assessment  Comments: Performed by Beverlyn Roux SRNA

## 2019-06-05 DIAGNOSIS — C50512 Malignant neoplasm of lower-outer quadrant of left female breast: Secondary | ICD-10-CM | POA: Diagnosis not present

## 2019-06-05 LAB — BASIC METABOLIC PANEL
Anion gap: 8 (ref 5–15)
BUN: 10 mg/dL (ref 8–23)
CO2: 27 mmol/L (ref 22–32)
Calcium: 8.6 mg/dL — ABNORMAL LOW (ref 8.9–10.3)
Chloride: 105 mmol/L (ref 98–111)
Creatinine, Ser: 0.89 mg/dL (ref 0.44–1.00)
GFR calc Af Amer: >60 mL/min (ref >60)
GFR calc non Af Amer: 60 mL/min — ABNORMAL LOW (ref >60)
Glucose, Bld: 91 mg/dL (ref 70–99)
Potassium: 3.8 mmol/L (ref 3.5–5.1)
Sodium: 140 mmol/L (ref 135–145)

## 2019-06-05 LAB — PHOSPHORUS: Phosphorus: 1.4 mg/dL — ABNORMAL LOW (ref 2.5–4.6)

## 2019-06-05 LAB — MAGNESIUM: Magnesium: 2.1 mg/dL (ref 1.7–2.4)

## 2019-06-05 MED ORDER — ACETAMINOPHEN 500 MG PO TABS
1000.0000 mg | ORAL_TABLET | Freq: Three times a day (TID) | ORAL | Status: DC
  Filled 2019-06-05: qty 2

## 2019-06-05 MED ORDER — OXYCODONE HCL 5 MG PO TABS: 5.0000 mg | ORAL_TABLET | Freq: Four times a day (QID) | ORAL | 0 refills | Status: DC | PRN

## 2019-06-05 MED ORDER — METHOCARBAMOL 1000 MG/10ML IJ SOLN
500.0000 mg | Freq: Once | INTRAVENOUS | Status: DC
  Filled 2019-06-05: qty 5

## 2019-06-05 MED ORDER — OXYCODONE HCL 5 MG PO TABS
5.0000 mg | ORAL_TABLET | Freq: Four times a day (QID) | ORAL | Status: DC | PRN
  Administered 2019-06-05: 5 mg via ORAL
  Filled 2019-06-05: qty 1

## 2019-06-05 MED ORDER — METHOCARBAMOL 500 MG PO TABS: 500.0000 mg | ORAL_TABLET | Freq: Three times a day (TID) | ORAL | 0 refills | Status: DC | PRN

## 2019-06-05 NOTE — Plan of Care (Signed)
  Problem: Education: Goal: Knowledge of General Education information will improve Description: Including pain rating scale, medication(s)/side effects and non-pharmacologic comfort measures Outcome: Completed/Met   Problem: Health Behavior/Discharge Planning: Goal: Ability to manage health-related needs will improve Outcome: Completed/Met   Problem: Clinical Measurements: Goal: Ability to maintain clinical measurements within normal limits will improve Outcome: Completed/Met Goal: Will remain free from infection Outcome: Completed/Met Goal: Diagnostic test results will improve Outcome: Completed/Met Goal: Respiratory complications will improve Outcome: Completed/Met Goal: Cardiovascular complication will be avoided Outcome: Completed/Met   Problem: Activity: Goal: Risk for activity intolerance will decrease Outcome: Completed/Met   Problem: Nutrition: Goal: Adequate nutrition will be maintained Outcome: Completed/Met   Problem: Coping: Goal: Level of anxiety will decrease Outcome: Completed/Met   Problem: Elimination: Goal: Will not experience complications related to bowel motility Outcome: Completed/Met Goal: Will not experience complications related to urinary retention Outcome: Completed/Met   Problem: Pain Managment: Goal: General experience of comfort will improve Outcome: Completed/Met   Problem: Safety: Goal: Ability to remain free from injury will improve Outcome: Completed/Met   Problem: Skin Integrity: Goal: Risk for impaired skin integrity will decrease Outcome: Completed/Met   Problem: Education: Goal: Knowledge of disease or condition will improve Outcome: Completed/Met   Problem: Activity: Goal: Ability to maintain or regain function will improve Outcome: Completed/Met   Problem: Clinical Measurements: Goal: Postoperative complications will be avoided or minimized Outcome: Completed/Met   Problem: Self-Concept: Goal: Ability to  verbalize positive feelings about self will improve Outcome: Completed/Met   Problem: Pain Management: Goal: Expressions of feelings of enhanced comfort will increase Outcome: Completed/Met   Problem: Skin Integrity: Goal: Demonstration of wound healing without infection will improve Outcome: Completed/Met

## 2019-06-05 NOTE — Progress Notes (Signed)
Monica Becton to be D/C'd  per MD order. Discussed with the patient and all questions fully answered.  VSS, Skin clean, dry and intact without evidence of skin break down, no evidence of skin tears noted.  IV catheter discontinued intact. Site without signs and symptoms of complications. Dressing and pressure applied.  An After Visit Summary was printed and given to the patient. Patient received prescription.  D/c education completed with patient/family including follow up instructions, medication list, d/c activities limitations if indicated, with other d/c instructions as indicated by MD - patient able to verbalize understanding, all questions fully answered.   Patient instructed to return to ED, call 911, or call MD for any changes in condition.   Patient to be escorted via [place], and D/C home via private auto.

## 2019-06-05 NOTE — Discharge Summary (Signed)
Physician Discharge Summary  Patient ID: [name] MRN: [id number] DOB/AGE: 1934/02/20 86 y.o.  Admit date: 06/04/2019 Discharge date: 06/05/2019  Admission Diagnoses: Patient Active Problem List   Diagnosis Date Noted  . Lipoma of left upper thigh 3x5 cm 09/28/2011    Priority: High  . History of breast cancer [id number], s/p BCT 2008, triple negative 01/26/2011    Priority: High  . Chronic L breast pain with chronic recurrent seroma, s/p excisional biopsy 01/12/2010 01/26/2011    Priority: High  . Breast cancer of lower-outer quadrant of left female breast ([place]) 06/04/2019  . Candida infection, oral 01/15/2019  . Senile purpura ([place]) 04/14/2018  . Lumbar post-laminectomy syndrome 12/03/2017  . Lumbar spondylosis 12/03/2017  . History of back surgery 09/01/2017  . Constipation 09/01/2017  . Hypothyroidism due to acquired atrophy of thyroid 09/01/2017  . Mixed hyperlipidemia 09/01/2017  . Age-related osteoporosis without current pathological fracture 09/01/2017  . High risk medication use 09/01/2017  . Arthritis of hand 05/17/2017  . 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 Status post total bilateral knee replacement 12/20/2016  . Gait abnormality 07/16/2016  . Chronic low back pain 07/16/2016  . Mild cognitive impairment 07/16/2016  . Wrist pain 05/10/2015  . S/P left TKA 11/29/2014  . S/P knee replacement 11/29/2014  . Spontaneous bruising 08/27/2014  . Numbness and tingling in right hand 07/28/2014  . Essential tremor 07/28/2014  . Dizziness 05/28/2014  . Shaky 05/28/2014  . Memory loss 05/28/2014  . Depression 05/06/2014  . Cerebral vascular accident ([place]) 08/09/2011  . Syncope 08/09/2011  . Fainted   . ICD (implantable cardioverter-defibrillator), biventricular, in situ 08/02/2010  . Chronic systolic heart failure ([place]) 08/02/2010  . Benign hypertension 08/02/2010    Discharge Diagnoses:  Active  Problems:   Breast cancer of lower-outer quadrant of left female breast [facility]) and same as above  Discharged Condition: stable  Hospital Course:  Pt was admitted to the floor following left mastectomy with sentinel lymph node biopsy 06/04/2019 for recurrent triple negative breast cancer. She had quite a bit of pain the morning of POD 1.  We adjusted her medication and this improved.  She had drain teaching.  She was ambulatory.  She had no evidence of flap hematoma.  Her vitals were stable.   She was discharged in stable condition.    Consults: None  Significant Diagnostic Studies: labs: none concerning  Treatments: surgery: see above  Discharge Exam: Blood pressure 128/63, pulse 61, temperature 97.9 F (36.6 C), temperature source Oral, resp. rate 14, height 4\' 10"  (1.473 m), weight 54.4 kg, SpO2 100 %. General appearance: alert, cooperative and mild distress Resp: breathing comfortably Breasts: no hematoma present on chest wall.  AICD in place.  drain with serosang output GI: soft, non distended, non tender Extremities: extremities normal, atraumatic, no cyanosis or edema  Disposition: Discharge disposition: 01-Home or Self Care       Discharge Instructions    Call MD for:  difficulty breathing, headache or visual disturbances   Complete by: As directed    Call MD for:  persistant nausea and vomiting   Complete by: As directed    Call MD for:  redness, tenderness, or signs of infection (pain, swelling, redness, odor or green/yellow discharge around incision site)   Complete by: As directed    Call MD for:  severe uncontrolled pain   Complete by: As directed    Call MD for:  temperature >100.4  Complete by: As directed    Diet - low sodium heart healthy   Complete by: As directed    Discharge wound care:   Complete by: As directed    Measure and record drain output at least twice daily.  Bring record to clinic.   Increase activity slowly   Complete by: As directed       Allergies as of 06/05/2019      Reactions   Iodine Shortness Of Breath   Iodine contrast, CHF , SOB   Shellfish Allergy Shortness Of Breath   Memantine    Malaise, fogginess/ couldn't think   Aspirin Nausea And Vomiting   Codeine Nausea And Vomiting      Medication List    TAKE these medications   acetaminophen 650 MG CR tablet Commonly known as: TYLENOL Take 650 mg by mouth every 8 (eight) hours as needed for pain.   ascorbic acid 500 MG tablet Commonly known as: VITAMIN C Take 500 mg by mouth daily.   buPROPion 150 MG 24 hr tablet Commonly known as: WELLBUTRIN XL Take 3 tablets (450 mg total) by mouth daily.   carvedilol 3.125 MG tablet Commonly known as: COREG TAKE 1 TABLET BY MOUTH TWO  TIMES DAILY What changed: when to take this   Cod Liver Oil Caps Take 1 capsule by mouth daily. 415 mg   furosemide 20 MG tablet Commonly known as: LASIX Take 1 tablet (20 mg total) by mouth daily.   levothyroxine 100 MCG tablet Commonly known as: SYNTHROID Take 1 tablet (100 mcg total) by mouth daily before breakfast.   Magnesium 250 MG Tabs Take 250 mg by mouth daily.   methocarbamol 500 MG tablet Commonly known as: ROBAXIN Take 1 tablet (500 mg total) by mouth every 8 (eight) hours as needed for muscle spasms.   NONFORMULARY OR COMPOUNDED ITEM Diclofenac-Baclofen 2% [id number] 1-2 grams topically four times daily. Before application of cream rub 2 minutes to active max penetration. Quantity 120 grams What changed:   how much to take  how to take this  when to take this  additional instructions   omeprazole 40 MG capsule Commonly known as: PRILOSEC TAKE ONE CAPSULE BY MOUTH  ONCE DAILY FOR STOMACH What changed: See the new instructions.   Oxcarbazepine 300 MG tablet Commonly known as: TRILEPTAL Take 300 mg by mouth daily.   oxyCODONE 5 MG immediate release tablet Commonly known as: Oxy IR/ROXICODONE Take 1-2 tablets (5-10 mg total) by mouth every 6  (six) hours as needed for severe pain.   potassium chloride 10 MEQ tablet Commonly known as: KLOR-CON TAKE 1 TABLET BY MOUTH  DAILY   sertraline 100 MG tablet Commonly known as: ZOLOFT Take 200 mg by mouth daily.   spironolactone 25 MG tablet Commonly known as: ALDACTONE TAKE 1 TABLET BY MOUTH IN  THE MORNING What changed: when to take this   Vitamin D3 50 MCG (2000 UT) Tabs Take 2,000 Units by mouth daily.   Vitamin E 400 units Tabs Take 400 Units by mouth daily.            Discharge Care Instructions  (From admission, onward)         Start     Ordered   06/05/19 0000  Discharge wound care:    Comments: Measure and record drain output at least twice daily.  Bring record to clinic.   06/05/19 1259         Follow-up Information    [name], MD  In 2 weeks.   Specialty: General Surgery Contact information: [address] [place] [address] [phone]           Signed: [name] 06/05/2019, 12:59 PM

## 2019-06-14 LAB — SURGICAL PATHOLOGY

## 2019-06-15 ENCOUNTER — Ambulatory Visit: Payer: Medicare Other | Attending: Internal Medicine

## 2019-06-15 ENCOUNTER — Telehealth: Payer: Self-pay | Admitting: Internal Medicine

## 2019-06-15 DIAGNOSIS — Z23 Encounter for immunization: Secondary | ICD-10-CM | POA: Insufficient documentation

## 2019-06-15 NOTE — Telephone Encounter (Signed)
Patient states she had a masectomy on 06/11/19 and she is calling to inquire about whether or not [place] appointment scheduled for 06/16/19 needs to be rescheduled due to the area being swollen. Please call and advise. Patient states that if she is not available a detailed voice message may be left.

## 2019-06-15 NOTE — Progress Notes (Signed)
   Covid-19 Vaccination Clinic  Name:  Taylee Yew    MRN: [id number] DOB: Sep 09, 1933  06/15/2019  Ms. Stearns was observed post Covid-19 immunization for 30 minutes based on pre-vaccination screening without incident. She was provided with Vaccine Information Sheet and instruction to access the V-Safe system.   Ms. Burgus was instructed to call 911 with any severe reactions post vaccine: Marland Kitchen Difficulty breathing  . Swelling of face and throat  . A fast heartbeat  . A bad rash all over body  . Dizziness and weakness   Immunizations Administered    Name Date Dose VIS Date Route   Pfizer COVID-19 Vaccine 06/15/2019 10:30 AM 0.3 mL 03/20/2019 Intramuscular   Manufacturer: [place]   Lot: [id number]   [place]: [id number]

## 2019-06-15 NOTE — Telephone Encounter (Signed)
Spoke with pt, advised that we can still do interrogation with the swelling present. Pt confirmed she will come for appt on 06/16/19

## 2019-06-16 ENCOUNTER — Encounter: Payer: Self-pay | Admitting: Internal Medicine

## 2019-06-16 ENCOUNTER — Other Ambulatory Visit: Payer: Self-pay

## 2019-06-16 ENCOUNTER — Ambulatory Visit (INDEPENDENT_AMBULATORY_CARE_PROVIDER_SITE_OTHER): Payer: Medicare Other | Admitting: Internal Medicine

## 2019-06-16 VITALS — BP 132/78 | HR 90 | Ht 59.0 in | Wt 121.0 lb

## 2019-06-16 DIAGNOSIS — Z95810 Presence of automatic (implantable) cardiac defibrillator: Secondary | ICD-10-CM | POA: Diagnosis not present

## 2019-06-16 DIAGNOSIS — I5022 Chronic systolic (congestive) heart failure: Secondary | ICD-10-CM | POA: Diagnosis not present

## 2019-06-16 DIAGNOSIS — I428 Other cardiomyopathies: Secondary | ICD-10-CM

## 2019-06-16 DIAGNOSIS — I10 Essential (primary) hypertension: Secondary | ICD-10-CM

## 2019-06-16 LAB — CUP PACEART INCLINIC DEVICE CHECK
Date Time Interrogation Session: 20210309000000
HighPow Impedance: 46 Ohm
Implantable Lead Implant Date: 20080116
Implantable Lead Implant Date: 20080116
Implantable Lead Implant Date: 20080116
Implantable Lead Location: 753859
Implantable Lead Location: 753860
Implantable Lead Location: 753860
Implantable Lead Model: 157
Implantable Lead Model: 4469
Implantable Lead Model: 4555
Implantable Lead Serial Number: 136532
Implantable Lead Serial Number: 161542
Implantable Lead Serial Number: 473495
Implantable Pulse Generator Implant Date: 20140911
Lead Channel Impedance Value: 392 Ohm
Lead Channel Impedance Value: 539 Ohm
Lead Channel Impedance Value: 846 Ohm
Lead Channel Pacing Threshold Amplitude: 0.6 V
Lead Channel Pacing Threshold Amplitude: 0.7 V
Lead Channel Pacing Threshold Amplitude: 0.8 V
Lead Channel Pacing Threshold Pulse Width: 0.4 ms
Lead Channel Pacing Threshold Pulse Width: 0.4 ms
Lead Channel Pacing Threshold Pulse Width: 0.8 ms
Lead Channel Sensing Intrinsic Amplitude: 18.4 mV
Lead Channel Sensing Intrinsic Amplitude: 25 mV
Lead Channel Sensing Intrinsic Amplitude: 6.7 mV
Lead Channel Setting Pacing Amplitude: 2 V
Lead Channel Setting Pacing Amplitude: 2 V
Lead Channel Setting Pacing Amplitude: 2.4 V
Lead Channel Setting Pacing Pulse Width: 0.4 ms
Lead Channel Setting Pacing Pulse Width: 0.8 ms
Lead Channel Setting Sensing Sensitivity: 0.6 mV
Lead Channel Setting Sensing Sensitivity: 1 mV
Pulse Gen Serial Number: 111765

## 2019-06-16 NOTE — Patient Instructions (Signed)
Medication Instructions:  Your physician recommends that you continue on your current medications as directed. Please refer to the Current Medication list given to you today.  Labwork: None ordered.  Testing/Procedures: None ordered.  Follow-Up: Your physician wants you to follow-up in: one year with Dr. Lovena Le.   You will receive a reminder letter in the mail two months in advance. If you don't receive a letter, please call our office to schedule the follow-up appointment.  Remote monitoring is used to monitor your ICD from home. This monitoring reduces the number of office visits required to check your device to one time per year. It allows [place] to keep an eye on the functioning of your device to ensure it is working properly. You are scheduled for a device check from home on 08/06/2019. You may send your transmission at any time that day. If you have a wireless device, the transmission will be sent automatically. After your physician reviews your transmission, you will receive a postcard with your next transmission date.  Any Other Special Instructions Will Be Listed Below (If Applicable).  If you need a refill on your cardiac medications before your next appointment, please call your pharmacy.

## 2019-06-16 NOTE — Progress Notes (Signed)
HPI [name] returns today for followup. She is a pleasant 86 yo woman with a h/o chronic systolic heart failure, LBBB, s/p biv ICD insertion. She notes some weight loss and dyspnea when she goes up an incline. She does ok on flat ground. No ICD shocks. She has been diagnosed with breast CA and undergone mastectomy. She has done well so far after breast surgery. She is still a little sore.  Allergies  Allergen Reactions  . Iodine Shortness Of Breath    Iodine contrast, CHF , SOB  . Shellfish Allergy Shortness Of Breath  . Memantine     Malaise, fogginess/ couldn't think  . Aspirin Nausea And Vomiting  . Codeine Nausea And Vomiting     Current Outpatient Medications  Medication Sig Dispense Refill  . acetaminophen (TYLENOL) 650 MG CR tablet Take 650 mg by mouth every 8 (eight) hours as needed for pain.    Marland Kitchen ascorbic acid (VITAMIN C) 500 MG tablet Take 500 mg by mouth daily.    Marland Kitchen buPROPion (WELLBUTRIN XL) 150 MG 24 hr tablet Take 3 tablets (450 mg total) by mouth daily. 270 tablet 3  . carvedilol (COREG) 3.125 MG tablet TAKE 1 TABLET BY MOUTH TWO  TIMES DAILY 180 tablet 0  . Cholecalciferol (VITAMIN D3) 50 MCG (2000 UT) TABS Take 2,000 Units by mouth daily.     Marland Kitchen Cod Liver Oil CAPS Take 1 capsule by mouth daily. 415 mg    . furosemide (LASIX) 20 MG tablet Take 1 tablet (20 mg total) by mouth daily. 90 tablet 1  . levothyroxine (SYNTHROID, LEVOTHROID) 100 MCG tablet Take 1 tablet (100 mcg total) by mouth daily before breakfast. 90 tablet 3  . Magnesium 250 MG TABS Take 250 mg by mouth daily.    . methocarbamol (ROBAXIN) 500 MG tablet Take 1 tablet (500 mg total) by mouth every 8 (eight) hours as needed for muscle spasms. 30 tablet 0  . NONFORMULARY OR COMPOUNDED ITEM Diclofenac-Baclofen 2% [id number] 1-2 grams topically four times daily. Before application of cream rub 2 minutes to active max penetration. Quantity 120 grams 120 each 2  . omeprazole (PRILOSEC) 40 MG capsule TAKE ONE  CAPSULE BY MOUTH  ONCE DAILY FOR STOMACH 90 capsule 1  . Oxcarbazepine (TRILEPTAL) 300 MG tablet Take 300 mg by mouth daily.     Marland Kitchen oxyCODONE (OXY IR/ROXICODONE) 5 MG immediate release tablet Take 1-2 tablets (5-10 mg total) by mouth every 6 (six) hours as needed for severe pain. 30 tablet 0  . potassium chloride (K-DUR) 10 MEQ tablet TAKE 1 TABLET BY MOUTH  DAILY 90 tablet 1  . sertraline (ZOLOFT) 100 MG tablet Take 200 mg by mouth daily.     Marland Kitchen spironolactone (ALDACTONE) 25 MG tablet TAKE 1 TABLET BY MOUTH IN  THE MORNING 90 tablet 1  . Vitamin E 400 units TABS Take 400 Units by mouth daily.      No current facility-administered medications for this visit.     Past Medical History:  Diagnosis Date  . Arthritis   . Cancer [facility])    left breast cancer   . Cataracts, bilateral    removed by surgery  . CHF (congestive heart failure) ([place])    PACEMAKER & DEFIB  . Complication of anesthesia    hypotensive after back surgery in 2006  . Depression   . Dyslipidemia   . Fainted 04/21/06   AT [place]  . GERD (gastroesophageal reflux disease)   .  Headache(784.0)   . Hearing loss    bilateral hearing aids  . HLD (hyperlipidemia)    diet controlled   . Hypertension   . Hypothyroidism   . ICD (implantable cardiac defibrillator) in place    pt has pacer/icd  . ICD (implantable cardiac defibrillator), biventricular, in situ   . LBBB (left bundle branch block)   . Memory loss   . Nonischemic cardiomyopathy ([place])   . Normal coronary arteries    s/p cardiac cath 2007  . Pacemaker    ICD [employer]  . Syncope   . Systolic CHF ([place])   . Vertigo   . Wears glasses     ROS:   All systems reviewed and negative except as noted in the HPI.   Past Surgical History:  Procedure Laterality Date  . BACK SURGERY     lumbar fusion   . BREAST LUMPECTOMY Left 2008  . BREAST SURGERY  2000   LUMP REMOVAL. STAGE 1 CANCER  . CARDIAC CATHETERIZATION    . CATARACT EXTRACTION    .  COLONOSCOPY    . EYE SURGERY    . IMPLANTABLE CARDIOVERTER DEFIBRILLATOR GENERATOR CHANGE N/A 12/18/2012   Procedure: IMPLANTABLE CARDIOVERTER DEFIBRILLATOR GENERATOR CHANGE;  Surgeon: [name], MD;  Location: [facility] C[facility];  Service: Cardiovascular;  Laterality: N/A;  . JOINT REPLACEMENT  06/14/01   right  . LUMBAR FUSION  2006  . MASS EXCISION  11/08/2011   Procedure: EXCISION MASS;  Surgeon: [name], MD;  Location: [facility];  Service: General;  Laterality: Left;  Excision Left Thigh Mass  . MASTECTOMY W/ SENTINEL NODE BIOPSY Left 06/04/2019   Procedure: LEFT MASTECTOMY WITH SENTINEL LYMPH NODE BIOPSY;  Surgeon: [name], MD;  Location: [place];  Service: General;  Laterality: Left;  Marland Kitchen MASTECTOMY, PARTIAL  2008   GOT PACEMAKER AND DEFIB AT THAT TIME  . PACEMAKER INSERTION  04/23/06  . TOTAL KNEE ARTHROPLASTY  05/17/01   RIGHT KNEE  . TOTAL KNEE ARTHROPLASTY Left 11/29/2014   Procedure: TOTAL LEFT KNEE ARTHROPLASTY;  Surgeon: Paralee Cancel, MD;  Location: [facility];  Service: Orthopedics;  Laterality: Left;     Family History  Problem Relation Age of Onset  . Hypertension Mother   . Arthritis Mother   . Hypertension Father   . Hypertension Brother   . Hypertension Brother      Social History   Socioeconomic History  . Marital status: Married    Spouse name: Not on file  . Number of children: 1  . Years of education: Masters  . Highest education level: Not on file  Occupational History  . Occupation: Retired  Tobacco Use  . Smoking status: Never Smoker  . Smokeless tobacco: Never Used  Substance and Sexual Activity  . Alcohol use: No  . Drug use: No  . Sexual activity: Not Currently    Birth control/protection: Post-menopausal  Other Topics Concern  . Not on file  Social History Narrative   Lives at home with husband.   Right-handed.      As of 07/28/2014   Diet: No special diet   Caffeine: yes, Chocolate, tea and sodas    Married: YES, 1970   House: Yes, 2  stories, 2-3 persons live in home   Pets: No   Current/Past profession: Engineer, mining, Designer, jewellery    Exercise: Yes 2-3 x weekly   Living Will: Yes   DNR: No   POA/HPOA: No      Social Determinants  of Health   Financial Resource Strain:   . Difficulty of Paying Living Expenses: Not on file  Food Insecurity:   . Worried About Charity fundraiser in the Last Year: Not on file  . Ran Out of Food in the Last Year: Not on file  Transportation Needs:   . Lack of Transportation (Medical): Not on file  . Lack of Transportation (Non-Medical): Not on file  Physical Activity:   . Days of Exercise per Week: Not on file  . Minutes of Exercise per Session: Not on file  Stress:   . Feeling of Stress : Not on file  Social Connections:   . Frequency of Communication with Friends and Family: Not on file  . Frequency of Social Gatherings with Friends and Family: Not on file  . Attends Religious Services: Not on file  . Active Member of Clubs or Organizations: Not on file  . Attends Archivist Meetings: Not on file  . Marital Status: Not on file  Intimate Partner Violence:   . Fear of Current or Ex-Partner: Not on file  . Emotionally Abused: Not on file  . Physically Abused: Not on file  . Sexually Abused: Not on file     BP 132/78   Pulse 90   Ht 4\' 11"  (1.499 m)   Wt 121 lb (54.9 kg)   LMP  (LMP Unknown)   SpO2 96%   BMI 24.44 kg/m   Physical Exam:  Well appearing NAD HEENT: Unremarkable Neck:  No JVD, no thyromegally Lymphatics:  No adenopathy Back:  No CVA tenderness Lungs:  Clear with no wheezes. Healing left chest incision HEART:  Regular rate rhythm, no murmurs, no rubs, no clicks Abd:  soft, positive bowel sounds, no organomegally, no rebound, no guarding Ext:  2 plus pulses, no edema, no cyanosis, no clubbing Skin:  No rashes no nodules Neuro:  CN II through XII intact, motor grossly intact  DEVICE  Normal device function.  See PaceArt for details.    Assess/Plan: 1. Chronic systolic heart failure - her symptoms remain class 2. She will continue her current meds. 2. ICD - her [employer] Sci DDD biv ICD is working normally. We will recheck in several months.  Mikle Bosworth.D.

## 2019-06-23 ENCOUNTER — Inpatient Hospital Stay: Payer: Medicare Other | Attending: Hematology | Admitting: Hematology

## 2019-06-23 ENCOUNTER — Other Ambulatory Visit: Payer: Self-pay

## 2019-06-23 VITALS — BP 145/79 | HR 72 | Temp 98.3°F | Resp 18 | Ht 59.0 in | Wt 119.8 lb

## 2019-06-23 DIAGNOSIS — I890 Lymphedema, not elsewhere classified: Secondary | ICD-10-CM | POA: Diagnosis not present

## 2019-06-23 DIAGNOSIS — Z171 Estrogen receptor negative status [ER-]: Secondary | ICD-10-CM | POA: Diagnosis not present

## 2019-06-23 DIAGNOSIS — Z170 Estrogen receptor positive status [ER+]: Secondary | ICD-10-CM | POA: Diagnosis not present

## 2019-06-23 DIAGNOSIS — Z79899 Other long term (current) drug therapy: Secondary | ICD-10-CM | POA: Diagnosis not present

## 2019-06-23 DIAGNOSIS — Z9012 Acquired absence of left breast and nipple: Secondary | ICD-10-CM | POA: Diagnosis not present

## 2019-06-23 DIAGNOSIS — Z853 Personal history of malignant neoplasm of breast: Secondary | ICD-10-CM | POA: Diagnosis not present

## 2019-06-23 DIAGNOSIS — D692 Other nonthrombocytopenic purpura: Secondary | ICD-10-CM | POA: Diagnosis not present

## 2019-06-23 DIAGNOSIS — M7989 Other specified soft tissue disorders: Secondary | ICD-10-CM | POA: Diagnosis not present

## 2019-06-23 DIAGNOSIS — I10 Essential (primary) hypertension: Secondary | ICD-10-CM | POA: Diagnosis not present

## 2019-06-23 DIAGNOSIS — C50912 Malignant neoplasm of unspecified site of left female breast: Secondary | ICD-10-CM | POA: Diagnosis not present

## 2019-06-23 NOTE — Progress Notes (Signed)
HEMATOLOGY/ONCOLOGY CLINIC NOTE  Date of Service: 06/23/2019  Patient Care Team: [name], NP as PCP - General (Geriatric Medicine) [name], MD as Consulting Physician (General Surgery) Paralee Cancel, MD as Consulting Physician (Orthopedic Surgery) Marica Otter, [place] (Optometry) Marcial Pacas, MD as Consulting Physician (Neurology) [name], MD as Consulting Physician (Cardiology) Ricard Dillon, MD (Psychiatry)  CHIEF COMPLAINTS/PURPOSE OF CONSULTATION:  Easily bruising Newly diagnosed breast cancer  HISTORY OF PRESENTING ILLNESS:   [name] is a wonderful 86 y.o. female who has been referred to [place] by Sherrie Mustache, NP for evaluation and management of easily bruising. The pt reports that she is doing well overall.   The pt reports that she has seen bruises on her arm, and denies bumping into things or any trauma. The pt notes that her bruises are solely located to her upper extremities. She denies concerns for bleeding in her joints, blood in the stools, blood in the urine, nose bleeds or gum bleeds. She notes she has bruised easily for about 2 years. She also notes that she began taking Zoloft about two years ago, and fish oil 4-5 years ago. She is not on any blood thinners. She denies taking any other new medications in the last couple years. The pt denies any thick bruises at any time. The pt denies excessive bleeding with pervious surgeries and dental extractions. The pt denies heavy periods when she was younger.  She has had two knee replacements and took Tramadol for her pain. She denies needing to use this frequently, and takes Tylenol for mild pains.    The pt takes Vitamin E oil for hot flashes, and notes that her Vitamin E oil successfully resolved her hot flashes. She began taking Vitamin E about 18 months ago.   The pt notes that she has lost about 20 pounds over two years. The pt reports some constipation and denies difficulty  swallowing, and weak appetite. The pt denies any dietary restrictions. She continues annual mammograms. She has a history of left sided breast cancer treated with a lumpectomy and radiation.  She notes that her energy levels have also decreased in the last 6 months, and "feels tired all the time." She endorses feeling well rested after taking naps. She notes that she does feel depressed but that taking Zoloft keeps her "afloat." She notes that she feels "medium" enjoyment in her activities. She sees psychiatry every 6 months.  The pt lives with her husband.   Most recent lab results (02/05/18) of CBC w/diff and CMP is as follows: all values are WNL except for PLT at 117k, BUN at 30, Creatinine at 0.96.  On review of systems, pt reports some stable depression, easily bruising on upper extremities, some weight loss, and denies nose bleeds, gum bleeds, blood in the urine, blood in the stools, abdominal pains, leg swelling, and any other symptoms.   On Family Hx the pt denies bleeding disorders.   INTERVAL HISTORY:   [name] is a wonderful 86 y.o. female who is here for evaluation and management of Stage 1B high-grade breast cancer. The patient's last visit with [place] was on 04/23/2018. The pt reports that she is doing well overall.  The pt reports that she has good days and bad days, but notes that her overall energy level has been lower. Pt is healing well from her Mastectomy on 06/04/2019 and is no longer having any drain. Because she received her first dose of the COVID19  vaccine near the same time as her Mastectomy she is unsure which is causing her fatigue. Pt is also experiencing muscle aches and pain as well as hand swelling. The incision area is where her pain is the greatest at this time. Pt has an upcoming follow up with Dr. Barry Dienes. She had her second dose of the COVID19 vaccine last Monday.   Pt was having some acid reflux and was given Prevacid by her PCP. The Prevacid was helping  but it stopped. Pt has been taking Prevacid with milk from time to time.   She has also had some stress due to the recent passing of her husband.   Of note since the patient's last visit, pt has had Left Breast Surgical Mastectomy [id number]) completed on 06/04/2019 with results revealing "BREAST, LEFT, MASTECTOMY:  - Invasive ductal carcinoma, 1.3 cm, Nottingham grade 3 of 3.  - Margins of resection are not involved (Closest margin: 8 mm, anterior/skin).  - Remote lumpectomy site. B. SENTINEL LYMPH NODE, LEFT AXILLARY #1, BIOPSY:  - Fibrous and adipose tissue, negative for carcinoma. - No distinct nodal tissue identified. C. SENTINEL LYMPH NODE, LEFT AXILLARY #2, BIOPSY: - One lymph node, negative for carcinoma (0/1). D. SENTINEL LYMPH NODE, LEFT AXILLARY #3, BIOPSY: - One lymph node, negative for carcinoma (0/1). E. SENTINEL LYMPH NODE, LEFT AXILLARY #4, BIOPSY: - One lymph node, negative for carcinoma (0/1). F. SENTINEL LYMPH NODE, LEFT AXILLARY #5, BIOPSY: - One lymph node, negative for carcinoma (0/1)."  Lab results (06/01/19) of CBC w/diff and CMP is as follows: all values are WNL except for PLT at 124K, BUN at 24.  On review of systems, pt reports fatigue, muscle aches/pain, pain at the incision site, hand swelling, acid reflux, stress and denies SOB, chest pain and any other symptoms.   MEDICAL HISTORY:  Past Medical History:  Diagnosis Date  . Arthritis   . Cancer [facility])    left breast cancer   . Cataracts, bilateral    removed by surgery  . CHF (congestive heart failure) ([place])    PACEMAKER & DEFIB  . Complication of anesthesia    hypotensive after back surgery in 2006  . Depression   . Dyslipidemia   . Fainted 04/21/06   AT [place]  . GERD (gastroesophageal reflux disease)   . Headache(784.0)   . Hearing loss    bilateral hearing aids  . HLD (hyperlipidemia)    diet controlled   . Hypertension   . Hypothyroidism   . ICD (implantable cardiac defibrillator) in place    pt  has pacer/icd  . ICD (implantable cardiac defibrillator), biventricular, in situ   . LBBB (left bundle branch block)   . Memory loss   . Nonischemic cardiomyopathy ([place])   . Normal coronary arteries    s/p cardiac cath 2007  . Pacemaker    ICD [employer]  . Syncope   . Systolic CHF ([place])   . Vertigo   . Wears glasses     SURGICAL HISTORY: Past Surgical History:  Procedure Laterality Date  . BACK SURGERY     lumbar fusion   . BREAST LUMPECTOMY Left 2008  . BREAST SURGERY  2000   LUMP REMOVAL. STAGE 1 CANCER  . CARDIAC CATHETERIZATION    . CATARACT EXTRACTION    . COLONOSCOPY    . EYE SURGERY    . IMPLANTABLE CARDIOVERTER DEFIBRILLATOR GENERATOR CHANGE N/A 12/18/2012   Procedure: IMPLANTABLE CARDIOVERTER DEFIBRILLATOR GENERATOR CHANGE;  Surgeon: [name], MD;  Location:  [facility] CATH [facility];  Service: Cardiovascular;  Laterality: N/A;  . JOINT REPLACEMENT  06/14/01   right  . LUMBAR FUSION  2006  . MASS EXCISION  11/08/2011   Procedure: EXCISION MASS;  Surgeon: [name], MD;  Location: [facility];  Service: General;  Laterality: Left;  Excision Left Thigh Mass  . MASTECTOMY W/ SENTINEL NODE BIOPSY Left 06/04/2019   Procedure: LEFT MASTECTOMY WITH SENTINEL LYMPH NODE BIOPSY;  Surgeon: [name], MD;  Location: [facility];  Service: General;  Laterality: Left;  Marland Kitchen MASTECTOMY, PARTIAL  2008   GOT PACEMAKER AND DEFIB AT THAT TIME  . PACEMAKER INSERTION  04/23/06  . TOTAL KNEE ARTHROPLASTY  05/17/01   RIGHT KNEE  . TOTAL KNEE ARTHROPLASTY Left 11/29/2014   Procedure: TOTAL LEFT KNEE ARTHROPLASTY;  Surgeon: Paralee Cancel, MD;  Location: [facility];  Service: Orthopedics;  Laterality: Left;    SOCIAL HISTORY: Social History   Socioeconomic History  . Marital status: Married    Spouse name: Not on file  . Number of children: 1  . Years of education: Masters  . Highest education level: Not on file  Occupational History  . Occupation: Retired  Tobacco Use  . Smoking status: Never  Smoker  . Smokeless tobacco: Never Used  Substance and Sexual Activity  . Alcohol use: No  . Drug use: No  . Sexual activity: Not Currently    Birth control/protection: Post-menopausal  Other Topics Concern  . Not on file  Social History Narrative   Lives at home with husband.   Right-handed.      As of 07/28/2014   Diet: No special diet   Caffeine: yes, Chocolate, tea and sodas    Married: YES, 1970   House: Yes, 2 stories, 2-3 persons live in home   Pets: No   Current/Past profession: Engineer, mining, Designer, jewellery    Exercise: Yes 2-3 x weekly   Living Will: Yes   DNR: No   POA/HPOA: No      Social Determinants of Radio broadcast assistant Strain:   . Difficulty of Paying Living Expenses:   Food Insecurity:   . Worried About Charity fundraiser in the Last Year:   . Arboriculturist in the Last Year:   Transportation Needs:   . Film/video editor (Medical):   Marland Kitchen Lack of Transportation (Non-Medical):   Physical Activity:   . Days of Exercise per Week:   . Minutes of Exercise per Session:   Stress:   . Feeling of Stress :   Social Connections:   . Frequency of Communication with Friends and Family:   . Frequency of Social Gatherings with Friends and Family:   . Attends Religious Services:   . Active Member of Clubs or Organizations:   . Attends Archivist Meetings:   Marland Kitchen Marital Status:   Intimate Partner Violence:   . Fear of Current or Ex-Partner:   . Emotionally Abused:   Marland Kitchen Physically Abused:   . Sexually Abused:     FAMILY HISTORY: Family History  Problem Relation Age of Onset  . Hypertension Mother   . Arthritis Mother   . Hypertension Father   . Hypertension Brother   . Hypertension Brother     ALLERGIES:  is allergic to iodine; shellfish allergy; memantine; aspirin; and codeine.  MEDICATIONS:  Current Outpatient Medications  Medication Sig Dispense Refill  . acetaminophen (TYLENOL) 650 MG CR tablet Take 650 mg by mouth every  8 (eight)  hours as needed for pain.    Marland Kitchen ascorbic acid (VITAMIN C) 500 MG tablet Take 500 mg by mouth daily.    Marland Kitchen buPROPion (WELLBUTRIN XL) 150 MG 24 hr tablet Take 3 tablets (450 mg total) by mouth daily. 270 tablet 3  . carvedilol (COREG) 3.125 MG tablet TAKE 1 TABLET BY MOUTH TWO  TIMES DAILY 180 tablet 0  . Cholecalciferol (VITAMIN D3) 50 MCG (2000 UT) TABS Take 2,000 Units by mouth daily.     Marland Kitchen Cod Liver Oil CAPS Take 1 capsule by mouth daily. 415 mg    . furosemide (LASIX) 20 MG tablet Take 1 tablet (20 mg total) by mouth daily. 90 tablet 1  . levothyroxine (SYNTHROID, LEVOTHROID) 100 MCG tablet Take 1 tablet (100 mcg total) by mouth daily before breakfast. 90 tablet 3  . Magnesium 250 MG TABS Take 250 mg by mouth daily.    . methocarbamol (ROBAXIN) 500 MG tablet Take 1 tablet (500 mg total) by mouth every 8 (eight) hours as needed for muscle spasms. 30 tablet 0  . NONFORMULARY OR COMPOUNDED ITEM Diclofenac-Baclofen 2% [id number] 1-2 grams topically four times daily. Before application of cream rub 2 minutes to active max penetration. Quantity 120 grams 120 each 2  . omeprazole (PRILOSEC) 40 MG capsule TAKE ONE CAPSULE BY MOUTH  ONCE DAILY FOR STOMACH 90 capsule 1  . Oxcarbazepine (TRILEPTAL) 300 MG tablet Take 300 mg by mouth daily.     Marland Kitchen oxyCODONE (OXY IR/ROXICODONE) 5 MG immediate release tablet Take 1-2 tablets (5-10 mg total) by mouth every 6 (six) hours as needed for severe pain. 30 tablet 0  . potassium chloride (K-DUR) 10 MEQ tablet TAKE 1 TABLET BY MOUTH  DAILY 90 tablet 1  . sertraline (ZOLOFT) 100 MG tablet Take 200 mg by mouth daily.     Marland Kitchen spironolactone (ALDACTONE) 25 MG tablet TAKE 1 TABLET BY MOUTH IN  THE MORNING 90 tablet 1  . Vitamin E 400 units TABS Take 400 Units by mouth daily.      No current facility-administered medications for this visit.    REVIEW OF SYSTEMS:   A 10+ POINT REVIEW OF SYSTEMS WAS OBTAINED including neurology, dermatology, psychiatry, cardiac,  respiratory, lymph, extremities, GI, GU, Musculoskeletal, constitutional, breasts, reproductive, HEENT.  All pertinent positives are noted in the HPI.  All others are negative.   PHYSICAL EXAMINATION  . Vitals:   06/23/19 1338  BP: (!) 145/79  Pulse: 72  Resp: 18  Temp: 98.3 F (36.8 C)  SpO2: 99%   Filed Weights   06/23/19 1338  Weight: 119 lb 12.8 oz (54.3 kg)   .Body mass index is 24.2 kg/m.  GENERAL:alert, in no acute distress and comfortable, s/p mastectomy with healing surgical incision - no drains, no open wound areas SKIN: no acute rashes, no significant lesions EYES: conjunctiva are pink and non-injected, sclera anicteric OROPHARYNX: MMM, no exudates, no oropharyngeal erythema or ulceration NECK: supple, no JVD LYMPH:  no palpable lymphadenopathy in the cervical, axillary or inguinal regions LUNGS: clear to auscultation b/l with normal respiratory effort HEART: regular rate & rhythm ABDOMEN:  normoactive bowel sounds , non tender, not distended. No palpable hepatosplenomegaly.  Extremity: no pedal edema PSYCH: alert & oriented x 3 with fluent speech NEURO: no focal motor/sensory deficits  LABORATORY DATA:  I have reviewed the data as listed  . CBC Latest Ref Rng & Units 06/01/2019 04/15/2019 11/10/2018  WBC 4.0 - 10.5 K/uL 7.0 7.8 7.4  Hemoglobin 12.0 -  15.0 g/dL 13.7 13.1 12.9  Hematocrit 36.0 - 46.0 % 43.6 40.0 39.2  Platelets 150 - 400 K/uL 124(L) 121(L) 119(L)    . CMP Latest Ref Rng & Units 06/05/2019 06/01/2019 04/15/2019  Glucose 70 - 99 mg/dL 91 85 85  BUN 8 - 23 mg/dL 10 24(H) 21  Creatinine 0.44 - 1.00 mg/dL 0.89 0.92 0.84  Sodium 135 - 145 mmol/L 140 140 141  Potassium 3.5 - 5.1 mmol/L 3.8 4.1 4.2  Chloride 98 - 111 mmol/L 105 103 105  CO2 22 - 32 mmol/L 27 28 29   Calcium 8.9 - 10.3 mg/dL 8.6(L) 10.3 10.1  Total Protein 6.5 - 8.1 g/dL - 6.9 6.7  Total Bilirubin 0.3 - 1.2 mg/dL - 0.5 0.4  Alkaline Phos 38 - 126 U/L - 54 -  AST 15 - 41 U/L - 30 28    ALT 0 - 44 U/L - 26 25     RADIOGRAPHIC STUDIES: I have personally reviewed the radiological images as listed and agreed with the findings in the report.  ASSESSMENT & PLAN:   86 y.o. female with  1. Easily bruising - labs did not demonstrate any specific bleeding diathesis 2. Newly diagnosed Stage IB ER/PR/Her 2 negative left breast cancer s/p mastectomy. negSNLBx 3. LUE lymphedema  PLAN: -Discussed pt labwork, 06/01/19; all values are WNL except for PLT at 124K, BUN at 24. -Discussed 06/04/2019 Left Breast Surgical Mastectomy [id number]) which revealed "BREAST, LEFT, MASTECTOMY:  - Invasive ductal carcinoma, 1.3 cm, Nottingham grade 3 of 3.  - Margins of resection are not involved (Closest margin: 8 mm, anterior/skin)." No lymph node involvement.  -Pt has Stage 1B triple negative, high-grade breast cancer -Advised pt that her Mastectomy removed all visible evidence of cancer, but there still may be microscopic disease.  -Advised pt that due to her age, the risk of complications from adjuvant chemotherapy would be higher than the marginal benefit for breast cancer recurrence prevention.  -Would not recommend chemotherapy in a preventive capacity -Recommended that the pt continue to eat well, drink at least 48-64 oz of water each day, and walk 20-30 minutes each day.  -Recommend pt continue to keep her left arm elevated as much as possible. Avoid BP cuffs and IV lines on her left arm.  -Recommend pt take Prevacid first thing in the morning with water and avoid taking with milk.  -Recommend pt f/u with Dr. Barry Dienes as scheduled -Will refer pt to lymphedema clinic  -Will get [place] Upper Extremity r/o DVT -Will see back in 6 months with labs  2)  Patient Active Problem List   Diagnosis Date Noted  . Breast cancer of lower-outer quadrant of left female breast ([place]) 06/04/2019  . Candida infection, oral 01/15/2019  . Senile purpura ([place]) 04/14/2018  . Lumbar post-laminectomy syndrome  12/03/2017  . Lumbar spondylosis 12/03/2017  . History of back surgery 09/01/2017  . Constipation 09/01/2017  . Hypothyroidism due to acquired atrophy of thyroid 09/01/2017  . Mixed hyperlipidemia 09/01/2017  . Age-related osteoporosis without current pathological fracture 09/01/2017  . High risk medication use 09/01/2017  . Arthritis of hand 05/17/2017  . Atherosclerosis of native arteries of extremity with intermittent claudication ([place]) 05/15/2017  . Status post total bilateral knee replacement 12/20/2016  . Gait abnormality 07/16/2016  . Chronic low back pain 07/16/2016  . Mild cognitive impairment 07/16/2016  . Wrist pain 05/10/2015  . S/P left TKA 11/29/2014  . S/P knee replacement 11/29/2014  . Spontaneous bruising 08/27/2014  .  Numbness and tingling in right hand 07/28/2014  . Essential tremor 07/28/2014  . Dizziness 05/28/2014  . Shaky 05/28/2014  . Memory loss 05/28/2014  . Depression 05/06/2014  . Lipoma of left upper thigh 3x5 cm 09/28/2011  . Cerebral vascular accident ([place]) 08/09/2011  . Syncope 08/09/2011  . History of breast cancer [id number], s/p BCT 2008, triple negative 01/26/2011  . Chronic L breast pain with chronic recurrent seroma, s/p excisional biopsy 01/12/2010 01/26/2011  . Fainted   . ICD (implantable cardioverter-defibrillator), biventricular, in situ 08/02/2010  . Chronic systolic heart failure ([place]) 08/02/2010  . Hypertension 08/02/2010   -continue f/u with PCP for mx of other chronic medical issues.  FOLLOW UP: [place] upper extremity to r/o DVT Referral to rehab for lymphedema management RTC with Dr Irene Limbo with labs in 6 months  . Orders Placed This Encounter  Procedures  . US Venous Img Upper Uni Left (DVT)    Standing Status:   Future    Standing Expiration Date:   08/22/2020    Order Specific Question:   Reason for Exam (SYMPTOM  OR DIAGNOSIS REQUIRED)    Answer:   left upper extremity swelling after left mastectomy for breast cancer r/o DVT     Order Specific Question:   Preferred imaging location?    Answer:   [facility]  . CBC with Differential/Platelet    Standing Status:   Future    Standing Expiration Date:   07/27/2020  . CMP ([place] only)    Standing Status:   Future    Standing Expiration Date:   06/22/2020  . Vitamin D 25 hydroxy    Standing Status:   Future    Standing Expiration Date:   06/22/2020  . AMB referral to rehabilitation    Referral Priority:   Urgent    Referral Type:   Consultation    Number of Visits Requested:   1    The total time spent in the appt was 30 minutes and more than 50% was on counseling and direct patient cares.  All of the patient's questions were answered with apparent satisfaction. The patient knows to call the clinic with any problems, questions or concerns.    Sullivan Lone MD [place] AAHIVMS [facility] Hematology/Oncology Physician [facility]  (Office):       [phone] (Work cell):  [phone] (Fax):           [phone]  06/23/2019 4:33 PM  I, Yevette Edwards, am acting as a scribe for Dr. Sullivan Lone.   .I have reviewed the above documentation for accuracy and completeness, and I agree with the above. Brunetta Genera MD

## 2019-06-25 ENCOUNTER — Other Ambulatory Visit: Payer: Self-pay | Admitting: Internal Medicine

## 2019-06-25 ENCOUNTER — Other Ambulatory Visit: Payer: Self-pay | Admitting: Nurse Practitioner

## 2019-06-25 NOTE — Telephone Encounter (Signed)
High risk or very high risk warning populated when attempting to refill medication. RX request sent to PCP for review and approval if warranted.   

## 2019-06-29 ENCOUNTER — Ambulatory Visit: Payer: Medicare Other

## 2019-06-30 ENCOUNTER — Ambulatory Visit: Payer: Medicare Other | Attending: Hematology | Admitting: Physical Therapy

## 2019-06-30 ENCOUNTER — Other Ambulatory Visit: Payer: Self-pay

## 2019-06-30 DIAGNOSIS — M25612 Stiffness of left shoulder, not elsewhere classified: Secondary | ICD-10-CM | POA: Insufficient documentation

## 2019-06-30 DIAGNOSIS — M25512 Pain in left shoulder: Secondary | ICD-10-CM | POA: Insufficient documentation

## 2019-06-30 DIAGNOSIS — Z483 Aftercare following surgery for neoplasm: Secondary | ICD-10-CM | POA: Insufficient documentation

## 2019-06-30 DIAGNOSIS — I972 Postmastectomy lymphedema syndrome: Secondary | ICD-10-CM | POA: Insufficient documentation

## 2019-06-30 NOTE — Therapy (Signed)
[place], [place], [address] Phone: [phone]   Fax:  [phone]  Physical Therapy Evaluation  Patient Details  Name: [name] MRN: [id number] Date of Birth: 24-Dec-1933 Referring Provider (PT): [name]  ([name] is surgeon)   Encounter Date: 06/30/2019  PT End of Session - 06/30/19 1810    Visit Number  1    Number of Visits  17    Date for PT Re-Evaluation  08/31/19    PT Start Time  1410    PT Stop Time  1500    PT Time Calculation (min)  50 min    Activity Tolerance  Patient tolerated treatment well    Behavior During Therapy  [facility] for tasks assessed/performed       Past Medical History:  Diagnosis Date  . Arthritis   . Cancer [facility])    left breast cancer   . Cataracts, bilateral    removed by surgery  . CHF (congestive heart failure) ([place])    PACEMAKER & DEFIB  . Complication of anesthesia    hypotensive after back surgery in 2006  . Depression   . Dyslipidemia   . Fainted 04/21/06   AT [place]  . GERD (gastroesophageal reflux disease)   . Headache(784.0)   . Hearing loss    bilateral hearing aids  . HLD (hyperlipidemia)    diet controlled   . Hypertension   . Hypothyroidism   . ICD (implantable cardiac defibrillator) in place    pt has pacer/icd  . ICD (implantable cardiac defibrillator), biventricular, in situ   . LBBB (left bundle branch block)   . Memory loss   . Nonischemic cardiomyopathy ([place])   . Normal coronary arteries    s/p cardiac cath 2007  . Pacemaker    ICD [employer]  . Syncope   . Systolic CHF ([place])   . Vertigo   . Wears glasses     Past Surgical History:  Procedure Laterality Date  . BACK SURGERY     lumbar fusion   . BREAST LUMPECTOMY Left 2008  . BREAST SURGERY  2000   LUMP REMOVAL. STAGE 1 CANCER  . CARDIAC CATHETERIZATION    . CATARACT EXTRACTION    . COLONOSCOPY    . EYE SURGERY    . IMPLANTABLE CARDIOVERTER  DEFIBRILLATOR GENERATOR CHANGE N/A 12/18/2012   Procedure: IMPLANTABLE CARDIOVERTER DEFIBRILLATOR GENERATOR CHANGE;  Surgeon: Stacey Lance, MD;  Location: [facility] CATH LAB;  Service: Cardiovascular;  Laterality: N/A;  . JOINT REPLACEMENT  06/14/01   right  . LUMBAR FUSION  2006  . MASS EXCISION  11/08/2011   Procedure: EXCISION MASS;  Surgeon: Stacey Klein, MD;  Location: [facility];  Service: General;  Laterality: Left;  Excision Left Thigh Mass  . MASTECTOMY W/ SENTINEL NODE BIOPSY Left 06/04/2019   Procedure: LEFT MASTECTOMY WITH SENTINEL LYMPH NODE BIOPSY;  Surgeon: Stacey Klein, MD;  Location: [place];  Service: General;  Laterality: Left;  Marland Kitchen MASTECTOMY, PARTIAL  2008   GOT PACEMAKER AND DEFIB AT THAT TIME  . PACEMAKER INSERTION  04/23/06  . TOTAL KNEE ARTHROPLASTY  05/17/01   RIGHT KNEE  . TOTAL KNEE ARTHROPLASTY Left 11/29/2014   Procedure: TOTAL LEFT KNEE ARTHROPLASTY;  Surgeon: Stacey Cancel, MD;  Location: [facility];  Service: Orthopedics;  Laterality: Left;    There were no vitals filed for this visit.   Subjective Assessment - 06/30/19 1411    Subjective  "I just ache" pt  indicared lateral chest    Patient is accompained by:  Family member   [name] ( daughter )   Pertinent History  left breast cancer with left mastectomy on 06/04/2019 with 5 lymph nodes . past history includes lumpectomy in 2008 with radiation and no lymph nodes removed she has chronic left breast pain and seroma with excisional biopsy in 2011.  Hx includes CHR with implanted defibrillator in left chest, bilateral TKR, osteoporosis    Currently in Pain?  Yes    Pain Score  8     Pain Location  Chest    Pain Orientation  Left    Pain Descriptors / Indicators  Aching    Pain Type  Surgical pain    Pain Onset  1 to 4 weeks ago    Aggravating Factors   pt states that it hurts too much to wear her compression bandeau    Pain Relieving Factors  hold arm close to body    Effect of Pain on Daily Activities  intereferes with dressing,  household activites         [facility] PT Assessment - 06/30/19 0001      Assessment   Medical Diagnosis  left breast cancer     Referring Provider (PT)  [name]    [name] is surgeon   Onset Date/Surgical Date  06/04/19    Hand Dominance  Right      Precautions   Precautions  Other (comment)    Precaution Comments  lifting precautions, lymphedema precautions       Restrictions   Weight Bearing Restrictions  No      Balance Screen   Has the patient fallen in the past 6 months  Yes    How many times?  1   got dizzy after vaccination    Has the patient had a decrease in activity level because of a fear of falling?   Yes    Is the patient reluctant to leave their home because of a fear of falling?   No      Home Film/video editor residence    Living Arrangements  Children    Available Help at Discharge  Family      Prior Function   Level of [place]  Retired    Leisure  reading, used to exercise with senior TV program       Cognition   Overall Cognitive Status  Within Functional Limits for tasks assessed      Observation/Other Assessments   Observations  pt with fullness in left chest with pacemaker visible, incision pulled in axilla     Skin Integrity  incision on left chest held in place with steri strips     Other Surveys   Quick Dash    Quick DASH   70.45      Sensation   Light Touch  Not tested      Coordination   Gross Motor Movements are Fluid and Coordinated  No   limited by pain in left shoulder      Posture/Postural Control   Posture/Postural Control  Postural limitations    Postural Limitations  Rounded Shoulders;Forward head      ROM / Strength   AROM / PROM / Strength  AROM;Strength      AROM   Overall AROM   Deficits    AROM Assessment Site  Shoulder    Right Shoulder Extension  55 Degrees  Right Shoulder Flexion  155 Degrees    Right Shoulder ABduction  160 Degrees    Right Shoulder  External Rotation  70 Degrees    Left Shoulder Extension  55 Degrees    Left Shoulder Flexion  130 Degrees    Left Shoulder ABduction  115 Degrees    Left Shoulder External Rotation  85 Degrees      Strength   Overall Strength  Deficits    Overall Strength Comments  limited tp 2-/5 in left shoulder due to pain       Palpation   Palpation comment  tender tightness in left axilla and lateral chest         LYMPHEDEMA/ONCOLOGY QUESTIONNAIRE - 06/30/19 1800      Type   Cancer Type  left breast       Surgeries   Mastectomy Date  06/04/19    Other Surgery Date  --   left lumpectomy in 2008 and excision of seroma in 2011   Number Lymph Nodes Removed  5      Treatment   Past Radiation Treatment  Yes    Date  04/09/06   approximate date     What other symptoms do you have   Are you Having Heaviness or Tightness  Yes    Are you having Pain  Yes    Are you having pitting edema  Yes    Body Site  left forearm    Is it Hard or Difficult finding clothes that fit  No    Do you have infections  No    [place] Sign  Yes      Right Upper Extremity Lymphedema   10 cm Proximal to Olecranon Process  24.5 cm    Olecranon Process  23 cm    15 cm Proximal to Ulnar Styloid Process  21 cm    10 cm Proximal to Ulnar Styloid Process  18 cm    Just Proximal to Ulnar Styloid Process  13.8 cm    Across Hand at [employer]  17 cm    At [place] of 2nd Digit  5.8 cm      Left Upper Extremity Lymphedema   10 cm Proximal to Olecranon Process  28.2 cm    Olecranon Process  24 cm    15 cm Proximal to Ulnar Styloid Process  23.8 cm    10 cm Proximal to Ulnar Styloid Process  20.5 cm    Just Proximal to Ulnar Styloid Process  16 cm    Across Hand at [employer]  18 cm    At [place] of 2nd Digit  6 cm          Quick Dash - 06/30/19 0001    Open a tight or new jar  Unable    Do heavy household chores (wash walls, wash floors)  Unable    Carry a shopping bag or briefcase  Unable    Wash your  back  Moderate difficulty    Use a knife to cut food  No difficulty    Recreational activities in which you take some force or impact through your arm, shoulder, or hand (golf, hammering, tennis)  Unable    During the past week, to what extent has your arm, shoulder or hand problem interfered with your normal social activities with family, friends, neighbors, or groups?  Modererately    During the past week, to what extent has your arm, shoulder or hand problem limited  your work or other regular daily activities  Quite a bit    Arm, shoulder, or hand pain.  Severe    Tingling (pins and needles) in your arm, shoulder, or hand  Severe    Difficulty Sleeping  Moderate difficulty    DASH Score  70.45 %        Objective measurements completed on examination: See above findings.             [facility] Adult PT Treatment/Exercise - 06/30/19 0001      Manual Therapy   Manual Therapy  Edema management    Edema Management  provided medium Tg soft to upper and and small tg soft to lower arm for comfort.  Instructed in elevation and ROM to forearm and hand to try to move fluid              PT Education - 06/30/19 1809    Education Details  elevation of left arm with ROM to forearm and hand    Person(s) Educated  Patient    Methods  Explanation;Demonstration    Comprehension  Verbalized understanding;Returned demonstration       PT Short Term Goals - 06/30/19 1819      PT SHORT TERM GOAL #1   Title  Pt  will report pain in left shoulder is decreased to 6/10    Baseline  8/10 on 06/30/2019    Time  4    Status  New      PT SHORT TERM GOAL #2   Title  Pt wil be independent in a basic HEP for shoulder ROM    Time  4    Period  Weeks    Status  New        PT Long Term Goals - 06/30/19 1820      PT LONG TERM GOAL #1   Title  Pt will have reduction in left forearm at 15 cm proximla to ulnar styloid by 1 cm    Time  8    Period  Weeks    Status  New      PT LONG TERM GOAL  #2   Title  Pt and daugher will report they understand lymphedema risk reduction practices and how to manage left arm lymphedema at home    Time  8    Period  Weeks    Status  New      PT LONG TERM GOAL #3   Title  Pt will have 140 degrees of painfree left shoulder abduction so that she can easily perfom her ADLs at home    Baseline  115 and painful on 06/30/2019    Time  8    Period  Weeks    Status  New      PT LONG TERM GOAL #4   Title  Patient will be Independent with HEP for shoulder ROM and strength    Time  8    Period  Weeks    Status  New             Plan - 06/30/19 1810    Clinical Impression Statement  87 yo female presents to PT with pain and weakness in left shoulder after mastectomy 06/04/2019.  She has pitting edema in her left forearm and sweliing in her hand and upper arm. Debrillator is visible just above mastetomy incision.  She will benefit from PT to decrease pain and swelling in her left arm and increase shoulder ROM and strength.  Personal Factors and Comorbidities  Age;Comorbidity 3+    Comorbidities  previous triple negataive left breast cancer, seroma after lumpectomy, CHF with defibrillator, Bilateral TKR osteoporosis    Examination-Activity Limitations  Reach Overhead;Bathing    Stability/Clinical Decision Making  Stable/Uncomplicated   no further treatment planned   Rehab Potential  Good    PT Frequency  2x / week    PT Duration  8 weeks    PT Treatment/Interventions  ADLs/Self Care Home Management;DME Instruction;Therapeutic activities;Patient/family education;Orthotic Fit/Training;Manual techniques;Manual lymph drainage;Compression bandaging;Scar mobilization;Taping;Passive range of motion    PT Next Visit Plan  assess how tg soft helped.  Begin MLD to left arm and gentle ROM being mindfull of defibrillator, progress exercise as pain decreases manual techniques as needed    Consulted and Agree with Plan of Care  Patient       Patient will  benefit from skilled therapeutic intervention in order to improve the following deficits and impairments:  Decreased activity tolerance, Decreased knowledge of precautions, Decreased knowledge of use of DME, Decreased range of motion, Decreased strength, Increased fascial restricitons, Increased edema, Impaired perceived functional ability, Increased muscle spasms, Impaired UE functional use, Postural dysfunction, Pain  Visit Diagnosis: Aftercare following surgery for neoplasm - Plan: PT plan of care cert/re-cert  Acute pain of left shoulder - Plan: PT plan of care cert/re-cert  Stiffness of left shoulder joint - Plan: PT plan of care cert/re-cert  Postmastectomy lymphedema - Plan: PT plan of care cert/re-cert     Problem List Patient Active Problem List   Diagnosis Date Noted  . Breast cancer of lower-outer quadrant of left female breast ([place]) 06/04/2019  . Candida infection, oral 01/15/2019  . Senile purpura ([place]) 04/14/2018  . Lumbar post-laminectomy syndrome 12/03/2017  . Lumbar spondylosis 12/03/2017  . History of back surgery 09/01/2017  . Constipation 09/01/2017  . Hypothyroidism due to acquired atrophy of thyroid 09/01/2017  . Mixed hyperlipidemia 09/01/2017  . Age-related osteoporosis without current pathological fracture 09/01/2017  . High risk medication use 09/01/2017  . Arthritis of hand 05/17/2017  . Atherosclerosis of native arteries of extremity with intermittent claudication ([place]) 05/15/2017  . Status post total bilateral knee replacement 12/20/2016  . Gait abnormality 07/16/2016  . Chronic low back pain 07/16/2016  . Mild cognitive impairment 07/16/2016  . Wrist pain 05/10/2015  . S/P left TKA 11/29/2014  . S/P knee replacement 11/29/2014  . Spontaneous bruising 08/27/2014  . Numbness and tingling in right hand 07/28/2014  . Essential tremor 07/28/2014  . Dizziness 05/28/2014  . Shaky 05/28/2014  . Memory loss 05/28/2014  . Depression 05/06/2014  . Lipoma  of left upper thigh 3x5 cm 09/28/2011  . Cerebral vascular accident ([place]) 08/09/2011  . Syncope 08/09/2011  . History of breast cancer [id number], s/p BCT 2008, triple negative 01/26/2011  . Chronic L breast pain with chronic recurrent seroma, s/p excisional biopsy 01/12/2010 01/26/2011  . Fainted   . ICD (implantable cardioverter-defibrillator), biventricular, in situ 08/02/2010  . Chronic systolic heart failure ([place]) 08/02/2010  . Hypertension 08/02/2010   Donato Heinz. Owens Shark PT  Norwood Levo 06/30/2019, 6:25 PM  [place], [place], [address] Phone: [phone]   Fax:  [phone]  Name: Meaghan Sibole MRN: [id number] Date of Birth: May 31, 1933

## 2019-06-30 NOTE — Patient Instructions (Signed)
Elevate arm on a pillow with hand higher than your elbow Turn you hand to face palm up and palm down, Make a fist spread fingers wide

## 2019-07-01 ENCOUNTER — Encounter: Payer: Self-pay | Admitting: Family

## 2019-07-01 ENCOUNTER — Ambulatory Visit (INDEPENDENT_AMBULATORY_CARE_PROVIDER_SITE_OTHER): Payer: Medicare Other | Admitting: Family

## 2019-07-01 VITALS — BP 137/80 | HR 74

## 2019-07-01 DIAGNOSIS — R42 Dizziness and giddiness: Secondary | ICD-10-CM | POA: Diagnosis not present

## 2019-07-01 DIAGNOSIS — Z9012 Acquired absence of left breast and nipple: Secondary | ICD-10-CM | POA: Diagnosis not present

## 2019-07-01 DIAGNOSIS — I890 Lymphedema, not elsewhere classified: Secondary | ICD-10-CM | POA: Diagnosis not present

## 2019-07-01 MED ORDER — MECLIZINE HCL 12.5 MG PO TABS: 12.5000 mg | ORAL_TABLET | Freq: Three times a day (TID) | ORAL | 0 refills | Status: DC | PRN

## 2019-07-01 NOTE — Progress Notes (Signed)
This service is provided via telemedicine  No vital signs collected/recorded due to the encounter was a telemedicine visit.   Location of patient (ex: home, work):  Home  Patient consents to a telephone visit:  Yes  Location of the provider (ex: office, home):  [employer].  Name of any referring provider:  N/A  Names of all persons participating in the telemedicine service and their role in the encounter:  Patient, Heriberto Antigua, [place], [place], Webb Silversmith, NP.    Time spent on call:  8 minutes on the phone with Medical Assistant.    Provider: Kavita Bartl FNP-C  [name], NP  Patient Care Team: [name], NP as PCP - General (Geriatric Medicine) [name], MD as Consulting Physician (General Surgery) Paralee Cancel, MD as Consulting Physician (Orthopedic Surgery) Marica Otter, [place] (Optometry) Marcial Pacas, MD as Consulting Physician (Neurology) [name], MD as Consulting Physician (Cardiology) Ricard Dillon, MD (Psychiatry)  Extended Emergency Contact Information Primary Emergency Contact: Tamanika, [place] Home Phone: [phone] Relation: Daughter Secondary Emergency Contact: Newton,Doris Address: [address]          [place],  [address] [place] Phone: [phone] Relation: Other  Code Status:  DNR Goals of care: Advanced Directive information Advanced Directives 07/01/2019  Does Patient Have a Medical Advance Directive? Yes  Type of Advance Directive Living will;Out of facility DNR (pink MOST or yellow form)  Does patient want to make changes to medical advance directive? No - Patient declined  Copy of [place] in Chart? Yes - validated most recent copy scanned in chart (See row information)  Pre-existing out of facility DNR order (yellow form or pink MOST form) -     Chief Complaint  Patient presents with  . Acute Visit    Complaints of Vertigo    HPI:  Pt is a  86 y.o. female seen today for an acute visit for evaluation of vertigo x 2 days.states was lying in bed last night felt like the room is spinning.she had another episode this morning when she got up.she had some nausea this morning.She has had similar symptoms in the past about three years ago.thinks might have taken some medication to help.No history of head trauma.States had breakfast and lunch without any vomiting. 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 States incision site healing well.No drainage or redness.she denies any fever or chills.     Past Medical History:  Diagnosis Date  . Arthritis   . Cancer [facility])    left breast cancer   . Cataracts, bilateral    removed by surgery  . CHF (congestive heart failure) ([place])    PACEMAKER & DEFIB  . Complication of anesthesia    hypotensive after back surgery in 2006  . Depression   . Dyslipidemia   . Fainted 04/21/06   AT [place]  . GERD (gastroesophageal reflux disease)   . Headache(784.0)   . Hearing loss    bilateral hearing aids  . HLD (hyperlipidemia)    diet controlled   . Hypertension   . Hypothyroidism   . ICD (implantable cardiac defibrillator) in place    pt has pacer/icd  . ICD (implantable cardiac defibrillator), biventricular, in situ   . LBBB (left bundle branch block)   . Memory loss   . Nonischemic cardiomyopathy ([place])   .  Normal coronary arteries    s/p cardiac cath 2007  . Pacemaker    ICD [employer]  . Syncope   . Systolic CHF ([place])   . Vertigo   . Wears glasses    Past Surgical History:  Procedure Laterality Date  . BACK SURGERY     lumbar fusion   . BREAST LUMPECTOMY Left 2008  . BREAST SURGERY  2000   LUMP REMOVAL. STAGE 1 CANCER  . CARDIAC CATHETERIZATION    . CATARACT EXTRACTION    . COLONOSCOPY    . EYE SURGERY    .  IMPLANTABLE CARDIOVERTER DEFIBRILLATOR GENERATOR CHANGE N/A 12/18/2012   Procedure: IMPLANTABLE CARDIOVERTER DEFIBRILLATOR GENERATOR CHANGE;  Surgeon: [name], MD;  Location: [facility] CATH LAB;  Service: Cardiovascular;  Laterality: N/A;  . JOINT REPLACEMENT  06/14/01   right  . LUMBAR FUSION  2006  . MASS EXCISION  11/08/2011   Procedure: EXCISION MASS;  Surgeon: [name], MD;  Location: [facility];  Service: General;  Laterality: Left;  Excision Left Thigh Mass  . MASTECTOMY W/ SENTINEL NODE BIOPSY Left 06/04/2019   Procedure: LEFT MASTECTOMY WITH SENTINEL LYMPH NODE BIOPSY;  Surgeon: [name], MD;  Location: [place];  Service: General;  Laterality: Left;  Marland Kitchen MASTECTOMY, PARTIAL  2008   GOT PACEMAKER AND DEFIB AT THAT TIME  . PACEMAKER INSERTION  04/23/06  . TOTAL KNEE ARTHROPLASTY  05/17/01   RIGHT KNEE  . TOTAL KNEE ARTHROPLASTY Left 11/29/2014   Procedure: TOTAL LEFT KNEE ARTHROPLASTY;  Surgeon: Paralee Cancel, MD;  Location: [facility];  Service: Orthopedics;  Laterality: Left;    Allergies  Allergen Reactions  . Iodine Shortness Of Breath    Iodine contrast, CHF , SOB  . Shellfish Allergy Shortness Of Breath  . Memantine     Malaise, fogginess/ couldn't think  . Aspirin Nausea And Vomiting  . Codeine Nausea And Vomiting    Outpatient Encounter Medications as of 07/01/2019  Medication Sig  . acetaminophen (TYLENOL) 650 MG CR tablet Take 650 mg by mouth every 8 (eight) hours as needed for pain.  Marland Kitchen ascorbic acid (VITAMIN C) 500 MG tablet Take 500 mg by mouth daily.  Marland Kitchen buPROPion (WELLBUTRIN XL) 150 MG 24 hr tablet Take 3 tablets (450 mg total) by mouth daily.  . carvedilol (COREG) 3.125 MG tablet TAKE 1 TABLET BY MOUTH  TWICE DAILY  . Cholecalciferol (VITAMIN D3) 50 MCG (2000 UT) TABS Take 2,000 Units by mouth daily.   Marland Kitchen Cod Liver Oil CAPS Take 1 capsule by mouth daily. 415 mg  . furosemide (LASIX) 20 MG tablet TAKE 1 TABLET BY MOUTH  DAILY  . levothyroxine (SYNTHROID, LEVOTHROID) 100 MCG  tablet Take 1 tablet (100 mcg total) by mouth daily before breakfast.  . Magnesium 250 MG TABS Take 250 mg by mouth daily.  . methocarbamol (ROBAXIN) 500 MG tablet Take 1 tablet (500 mg total) by mouth every 8 (eight) hours as needed for muscle spasms.  . NONFORMULARY OR COMPOUNDED ITEM Diclofenac-Baclofen 2% [id number] 1-2 grams topically four times daily. Before application of cream rub 2 minutes to active max penetration. Quantity 120 grams  . omeprazole (PRILOSEC) 40 MG capsule TAKE 1 CAPSULE BY MOUTH  ONCE DAILY FOR STOMACH  . Oxcarbazepine (TRILEPTAL) 300 MG tablet Take 300 mg by mouth daily.   . potassium chloride (KLOR-CON) 10 MEQ tablet TAKE 1 TABLET BY MOUTH  DAILY  . sertraline (ZOLOFT) 100 MG tablet Take 200 mg by mouth daily.   Marland Kitchen  spironolactone (ALDACTONE) 25 MG tablet TAKE 1 TABLET BY MOUTH IN  THE MORNING  . Vitamin E 400 units TABS Take 400 Units by mouth daily.   . [DISCONTINUED] oxyCODONE (OXY IR/ROXICODONE) 5 MG immediate release tablet Take 1-2 tablets (5-10 mg total) by mouth every 6 (six) hours as needed for severe pain.   No facility-administered encounter medications on file as of 07/01/2019.    Review of Systems  Constitutional: Negative for appetite change, chills, fatigue and fever.  HENT: Negative for congestion, ear pain, postnasal drip, rhinorrhea, sinus pressure, sinus pain, sneezing, sore throat and tinnitus.   Eyes: Negative for discharge, redness, itching and visual disturbance.  Respiratory: Negative for cough, chest tightness, shortness of breath and wheezing.   Cardiovascular: Negative for chest pain and palpitations.       Swelling on legs has improved   Gastrointestinal: Negative for abdominal distention, abdominal pain, constipation, diarrhea, nausea and vomiting.  Genitourinary: Negative for difficulty urinating, dysuria, flank pain, frequency and urgency.  Skin: Negative for color change, pallor and rash.  Neurological: Negative for speech difficulty,  weakness, light-headedness, numbness and headaches.       Room spinning   Psychiatric/Behavioral: Negative for agitation and sleep disturbance. The patient is not nervous/anxious.     Immunization History  Administered Date(s) Administered  . Influenza, High Dose Seasonal PF 01/16/2018, 12/08/2018  . Influenza-Unspecified 12/08/2013, 01/10/2015, 12/09/2015, 01/12/2017  . PFIZER SARS-COV-2 Vaccination 05/23/2019, 06/15/2019  . Pneumococcal Conjugate-13 04/09/2012  . Pneumococcal Polysaccharide-23 02/28/2016  . Tdap 01/07/2013  . Zoster 08/05/2014   Pertinent  Health Maintenance Due  Topic Date Due  . MAMMOGRAM  03/15/2020  . INFLUENZA VACCINE  Completed  . DEXA SCAN  Completed  . PNA vac Low Risk Adult  Completed   Fall Risk  07/01/2019 04/15/2019 01/15/2019 12/16/2018 11/24/2018  Falls in the past year? 1 1 0 0 0  Number falls in past yr: 0 0 - 0 0  Injury with Fall? 0 0 - 0 0  Comment - - - - -     Vitals:   07/01/19 1505  BP: 137/80  Pulse: 74   There is no height or weight on file to calculate BMI. Physical Exam Unable to complete on Telephone visit.   Labs reviewed: Recent Labs    04/15/19 1119 06/01/19 1221 06/05/19 0342  NA 141 140 140  K 4.2 4.1 3.8  CL 105 103 105  CO2 29 28 27   GLUCOSE 85 85 91  BUN 21 24* 10  CREATININE 0.84 0.92 0.89  CALCIUM 10.1 10.3 8.6*  MG  --   --  2.1  PHOS  --   --  1.4*   Recent Labs    11/10/18 0904 04/15/19 1119 06/01/19 1221  AST 29 28 30   ALT 25 25 26   ALKPHOS  --   --  54  BILITOT 0.3 0.4 0.5  PROT 7.0 6.7 6.9  ALBUMIN  --   --  3.7   Recent Labs    11/10/18 0904 04/15/19 1119 06/01/19 1221  WBC 7.4 7.8 7.0  NEUTROABS 4,847 5,343 4.4  HGB 12.9 13.1 13.7  HCT 39.2 40.0 43.6  MCV 85.4 84.7 90.1  PLT 119* 121* 124*   Lab Results  Component Value Date   TSH 1.88 11/10/2018   No results found for: HGBA1C Lab Results  Component Value Date   CHOL 182 11/10/2018   HDL 60 11/10/2018   LDLCALC 104 (H)  11/10/2018   TRIG 88  11/10/2018   CHOLHDL 3.0 11/10/2018    Significant Diagnostic Results in last 30 days:  NM Sentinel Node Inj-No Rpt (Breast)  Result Date: 06/04/2019 Sulfur colloid was injected by the nuclear medicine technologist for melanoma sentinel node.   CUP PACEART INCLINIC DEVICE CHECK  Result Date: 06/16/2019 Device checked in clinic by industry. See scanned report. Follow up with [facility] in 1 year. Next remote 08/06/19.Lavenia Atlas, BSN, RN   Assessment/Plan  Dizziness Reports normal B/p and HR.No vomiting or vision changes reported. - unclear etiology has had this in the past.No new medication. - Encouraged to drink 6 glasses of water daily  - Fall and safety precautions. - continue to work with Physical Therapy  - Advised to take Meclizine 12.5 mg tablet one by mouth three times daily as needed - Notify provider or go to ED if symptoms worsen or fail to improve.  - meclizine (ANTIVERT) 12.5 MG tablet; Take 1 tablet (12.5 mg total) by mouth 3 (three) times daily as needed for dizziness.  Dispense: 30 tablet; Refill: 0  2. Status post left mastectomy Status post mastectomy 06/04/2019 reports incision healing well without any signs of infections. - continue to follow up with Oncologist Dr.Gautam   3. Lymphedema of left leg S/p left mastectomy. - continue with Physical Therapy.   Family/ staff Communication: Reviewed plan of care with patient. I connected with  Monica Becton on 07/01/19 by a video enabled telemedicine application and verified that I am speaking with the correct person using two identifiers.   I discussed the limitations of evaluation and management by telemedicine. The patient expressed understanding and agreed to proceed.  Spent 13 minutes of non-face to face with patient   Labs/tests ordered: None   Next Appointment: Has upcoming appointment with PCP 07/17/2019   Sandrea Hughs, NP

## 2019-07-01 NOTE — Patient Instructions (Addendum)
-   Take Meclizine 12.5 mg tablet one by mouth three times daily as needed - Drink at least 6 glasses of water daily  - Fall and safety precautions. - continue to work with Physical Therapy - Notify provider or go to ED if symptoms worsen or fail to improve.

## 2019-07-02 ENCOUNTER — Ambulatory Visit: Payer: Medicare Other

## 2019-07-02 ENCOUNTER — Other Ambulatory Visit: Payer: Self-pay

## 2019-07-02 DIAGNOSIS — Z483 Aftercare following surgery for neoplasm: Secondary | ICD-10-CM

## 2019-07-02 DIAGNOSIS — I972 Postmastectomy lymphedema syndrome: Secondary | ICD-10-CM

## 2019-07-02 DIAGNOSIS — M25612 Stiffness of left shoulder, not elsewhere classified: Secondary | ICD-10-CM

## 2019-07-02 DIAGNOSIS — M25512 Pain in left shoulder: Secondary | ICD-10-CM

## 2019-07-02 NOTE — Patient Instructions (Signed)
SHOULDER: Flexion - Supine (Cane)        Cancer Rehab [phone]    Hold cane in both hands. Raise arms up overhead. Do not allow back to arch. Hold _5__ seconds. Do __5-10__ times; __1-2__ times a day.   SELF ASSISTED WITH OBJECT: Shoulder Abduction / Adduction - Supine    Hold cane with both hands. Move both arms from side to side, keep elbows straight.  Hold when stretch felt for __5__ seconds. Repeat __5-10__ times; __1-2__ times a day. Once this becomes easier progress to third picture bringing affected arm towards ear by staying out to side. Same hold for _5_seconds. Repeat  _5-10_ times, _1-2_ times/day.  SHOULDER: External Rotation - Supine (Cane)    Hold cane with both hands. Rotate arm away from body. Keep elbow on floor and next to body. _5-10__ reps per set, hold 5 seconds, _1-2__ sets per day. Add towel to keep elbow at side.  WAIT 1-2 WEEKS UNTIL NO STRETCH FELT AT INCISION   Shoulder Blade Stretch    Clasp fingers behind head with elbows touching in front of face. Pull elbows back while pressing shoulder blades together. Relax and hold as tolerated, can place pillow under elbow here for comfort as needed and to allow for prolonged stretch.  Repeat __5__ times. Do __1-2__ sessions per day.

## 2019-07-02 NOTE — Therapy (Signed)
[place], [place], [address] Phone: [phone]   Fax:  [phone]  Physical Therapy Treatment  Patient Details  Name: [name] MRN: [id number] Date of Birth: Jul 07, 1933 Referring Provider (PT): Dr. Irene Limbo  (Dr Barry Dienes is surgeon)   Encounter Date: 07/02/2019  PT End of Session - 07/02/19 1223    Visit Number  2    Number of Visits  17    Date for PT Re-Evaluation  08/31/19    PT Start Time  1102    PT Stop Time  1212    PT Time Calculation (min)  70 min    Activity Tolerance  Patient tolerated treatment well    Behavior During Therapy  [facility] for tasks assessed/performed       Past Medical History:  Diagnosis Date  . Arthritis   . Cancer [facility])    left breast cancer   . Cataracts, bilateral    removed by surgery  . CHF (congestive heart failure) ([place])    PACEMAKER & DEFIB  . Complication of anesthesia    hypotensive after back surgery in 2006  . Depression   . Dyslipidemia   . Fainted 04/21/06   AT [place]  . GERD (gastroesophageal reflux disease)   . Headache(784.0)   . Hearing loss    bilateral hearing aids  . HLD (hyperlipidemia)    diet controlled   . Hypertension   . Hypothyroidism   . ICD (implantable cardiac defibrillator) in place    pt has pacer/icd  . ICD (implantable cardiac defibrillator), biventricular, in situ   . LBBB (left bundle branch block)   . Memory loss   . Nonischemic cardiomyopathy ([place])   . Normal coronary arteries    s/p cardiac cath 2007  . Pacemaker    ICD [employer]  . Syncope   . Systolic CHF ([place])   . Vertigo   . Wears glasses     Past Surgical History:  Procedure Laterality Date  . BACK SURGERY     lumbar fusion   . BREAST LUMPECTOMY Left 2008  . BREAST SURGERY  2000   LUMP REMOVAL. STAGE 1 CANCER  . CARDIAC CATHETERIZATION    . CATARACT EXTRACTION    . COLONOSCOPY    . EYE SURGERY    . IMPLANTABLE CARDIOVERTER  DEFIBRILLATOR GENERATOR CHANGE N/A 12/18/2012   Procedure: IMPLANTABLE CARDIOVERTER DEFIBRILLATOR GENERATOR CHANGE;  Surgeon: [name], MD;  Location: [facility] CATH LAB;  Service: Cardiovascular;  Laterality: N/A;  . JOINT REPLACEMENT  06/14/01   right  . LUMBAR FUSION  2006  . MASS EXCISION  11/08/2011   Procedure: EXCISION MASS;  Surgeon: [name], MD;  Location: [facility];  Service: General;  Laterality: Left;  Excision Left Thigh Mass  . MASTECTOMY W/ SENTINEL NODE BIOPSY Left 06/04/2019   Procedure: LEFT MASTECTOMY WITH SENTINEL LYMPH NODE BIOPSY;  Surgeon: [name], MD;  Location: [place];  Service: General;  Laterality: Left;  Marland Kitchen MASTECTOMY, PARTIAL  2008   GOT PACEMAKER AND DEFIB AT THAT TIME  . PACEMAKER INSERTION  04/23/06  . TOTAL KNEE ARTHROPLASTY  05/17/01   RIGHT KNEE  . TOTAL KNEE ARTHROPLASTY Left 11/29/2014   Procedure: TOTAL LEFT KNEE ARTHROPLASTY;  Surgeon: Paralee Cancel, MD;  Location: [facility];  Service: Orthopedics;  Laterality: Left;    There were no vitals filed for this visit.  Subjective Assessment - 07/02/19 1110    Subjective  I've been wearing the sleeve  she gave me last time but I had to take it off at night. It made my underarm ache. But my arm has gone down some since wearing it. Since I was here last I started having some vetigo, worse at night, so my doctor added Meclizine but I haven't noticed it helping yet.    Pertinent History  left breast cancer with left mastectomy on 06/04/2019 with 5 lymph nodes . past history includes lumpectomy in 2008 with radiation and no lymph nodes removed she has chronic left breast pain and seroma with excisional biopsy in 2011.  Hx includes CHR with implanted defibrillator in left chest, bilateral TKR, osteoporosis    Patient Stated Goals  be able to lift my arm better to comb my hair    Currently in Pain?  Yes    Pain Score  7     Pain Location  Chest    Pain Orientation  Left;Lateral    Pain Descriptors / Indicators  Aching     Pain Type  Surgical pain    Pain Radiating Towards  axilla    Pain Onset  1 to 4 weeks ago    Pain Frequency  Constant    Aggravating Factors   when pain meds are starting to wear off    Pain Relieving Factors  hold arm close to body                       [facility] Adult PT Treatment/Exercise - 07/02/19 0001      Shoulder Exercises: Supine   Horizontal ABduction  AAROM;Left;5 reps   with dowel   Horizontal ABduction Limitations  VCs to hold stretches for 5 sec and deep breathing during to encourage relaxation of muscles    External Rotation  AAROM;Left;5 reps   with dowel   External Rotation Limitations  Tactile cues to keep elbow at side, then pt able to return correct demo. Also tried fingers clasped behind head but pt reports feeling stretch into incision and some pain so educated her to wait on this stretch 1-2 weeks until no pull at incision felt.     Flexion  AAROM;Both;5 reps   with dowel   Flexion Limitations  VCs not to push into pain, only to feel a gentle stretch      Manual Therapy   Manual Therapy  Myofascial release;Manual Lymphatic Drainage (MLD);Passive ROM    Edema Management  provided longer TG soft medium for entire extremity as pt had trouble not allowing pieces to separate around her elbow and had increased circumference there today    Myofascial Release  Very gently to Lt axilla being mindful of healing incision where cording palpable    Manual Lymphatic Drainage (MLD)  In Supine: Short neck, 5 diaphragmatic breaths, Lt inguinal nodes and Lt axillo-inguinal anastomosis (avoided anterior inter-axillary due to pacemaker location), then Lt UE working from lateral upper arm to dorsal hand working from proximal to distal then retracing all steps.     Passive ROM  To Lt shoulder gently to pts tolerance into flexion, abduction and er; multiple VCs to relax due to guarding             PT Education - 07/02/19 1218    Education Details  Supine dowel  exercises    Person(s) Educated  Patient;Child(ren)   goddaughter   Methods  Explanation;Demonstration;Handout    Comprehension  Verbalized understanding;Returned demonstration;Tactile cues required;Need further instruction       PT Short  Term Goals - 06/30/19 1819      PT SHORT TERM GOAL #1   Title  Pt  will report pain in left shoulder is decreased to 6/10    Baseline  8/10 on 06/30/2019    Time  4    Status  New      PT SHORT TERM GOAL #2   Title  Pt wil be independent in a basic HEP for shoulder ROM    Time  4    Period  Weeks    Status  New        PT Long Term Goals - 06/30/19 1820      PT LONG TERM GOAL #1   Title  Pt will have reduction in left forearm at 15 cm proximla to ulnar styloid by 1 cm    Time  8    Period  Weeks    Status  New      PT LONG TERM GOAL #2   Title  Pt and daugher will report they understand lymphedema risk reduction practices and how to manage left arm lymphedema at home    Time  8    Period  Weeks    Status  New      PT LONG TERM GOAL #3   Title  Pt will have 140 degrees of painfree left shoulder abduction so that she can easily perfom her ADLs at home    Baseline  115 and painful on 06/30/2019    Time  8    Period  Weeks    Status  New      PT LONG TERM GOAL #4   Title  Patient will be Independent with HEP for shoulder ROM and strength    Time  8    Period  Weeks    Status  New            Plan - 07/02/19 1223    Clinical Impression Statement  Pt came in wearing 2 pieces of the TG soft issued at last session. Her arm seemed visibly reduced from pictures at eval, but she had about 2-3 inches distal to elbow where garment was not covering and increased circumference there. So issued 1 long piece, size med, for entire arm and pt reported this also felt more comfortable at her axilla. First session of stretching and manaul lymph drainage which pt tolerated very well, though she did require multiple VCs throughout for relaxation due  to guarding. Also cording palpable at axilla that was increasing her discomfort at end ROM. Pt reports her shoulder feeling good after session today. Also added supine dowel exercises to HEP after having pt return demo of each, briefly educated her goddaughter on these as well.    Personal Factors and Comorbidities  Age;Comorbidity 3+    Comorbidities  previous triple negataive left breast cancer, seroma after lumpectomy, CHF with defibrillator, Bilateral TKR osteoporosis    Examination-Activity Limitations  Reach Overhead;Bathing    Stability/Clinical Decision Making  Stable/Uncomplicated   no further treatment planned   Rehab Potential  Good    PT Frequency  2x / week    PT Next Visit Plan  assess new piece of TG soft and issue size small if her arm reduces well enough for it to fit; cont MLD to left arm and gentle ROM being mindfull of defibrillator, progress exercise as pain decreases; manual techniques as needed    PT Home Exercise Plan  Supine dowel exercises    Consulted and  Agree with Plan of Care  Patient       Patient will benefit from skilled therapeutic intervention in order to improve the following deficits and impairments:  Decreased activity tolerance, Decreased knowledge of precautions, Decreased knowledge of use of DME, Decreased range of motion, Decreased strength, Increased fascial restricitons, Increased edema, Impaired perceived functional ability, Increased muscle spasms, Impaired UE functional use, Postural dysfunction, Pain  Visit Diagnosis: Aftercare following surgery for neoplasm  Acute pain of left shoulder  Stiffness of left shoulder joint  Postmastectomy lymphedema     Problem List Patient Active Problem List   Diagnosis Date Noted  . Status post left mastectomy 07/01/2019  . Breast cancer of lower-outer quadrant of left female breast ([place]) 06/04/2019  . Candida infection, oral 01/15/2019  . Senile purpura ([place]) 04/14/2018  . Lumbar post-laminectomy  syndrome 12/03/2017  . Lumbar spondylosis 12/03/2017  . History of back surgery 09/01/2017  . Constipation 09/01/2017  . Hypothyroidism due to acquired atrophy of thyroid 09/01/2017  . Mixed hyperlipidemia 09/01/2017  . Age-related osteoporosis without current pathological fracture 09/01/2017  . High risk medication use 09/01/2017  . Arthritis of hand 05/17/2017  . Atherosclerosis of native arteries of extremity with intermittent claudication ([place]) 05/15/2017  . Status post total bilateral knee replacement 12/20/2016  . Gait abnormality 07/16/2016  . Chronic low back pain 07/16/2016  . Mild cognitive impairment 07/16/2016  . Wrist pain 05/10/2015  . S/P left TKA 11/29/2014  . S/P knee replacement 11/29/2014  . Spontaneous bruising 08/27/2014  . Numbness and tingling in right hand 07/28/2014  . Essential tremor 07/28/2014  . Dizziness 05/28/2014  . Shaky 05/28/2014  . Memory loss 05/28/2014  . Depression 05/06/2014  . Lipoma of left upper thigh 3x5 cm 09/28/2011  . Cerebral vascular accident ([place]) 08/09/2011  . Syncope 08/09/2011  . History of breast cancer [id number], s/p BCT 2008, triple negative 01/26/2011  . Chronic L breast pain with chronic recurrent seroma, s/p excisional biopsy 01/12/2010 01/26/2011  . Fainted   . ICD (implantable cardioverter-defibrillator), biventricular, in situ 08/02/2010  . Chronic systolic heart failure ([place]) 08/02/2010  . Hypertension 08/02/2010    Otelia Limes, PTA 07/02/2019, 12:29 PM  [place], [place], [address] Phone: [phone]   Fax:  [phone]  Name: [name] MRN: [id number] Date of Birth: Dec 09, 1933

## 2019-07-06 ENCOUNTER — Ambulatory Visit (INDEPENDENT_AMBULATORY_CARE_PROVIDER_SITE_OTHER): Payer: Medicare Other | Admitting: Family

## 2019-07-06 ENCOUNTER — Encounter: Payer: Self-pay | Admitting: Family

## 2019-07-06 ENCOUNTER — Other Ambulatory Visit: Payer: Self-pay

## 2019-07-06 DIAGNOSIS — I10 Essential (primary) hypertension: Secondary | ICD-10-CM | POA: Diagnosis not present

## 2019-07-06 MED ORDER — AMLODIPINE BESYLATE 5 MG PO TABS: 5.0000 mg | ORAL_TABLET | Freq: Every day | ORAL | 0 refills | Status: DC

## 2019-07-06 NOTE — Progress Notes (Signed)
Patient ID: [name], female   DOB: 1934-01-03, 86 y.o.   MRN: [id number] This service is provided via telemedicine  No vital signs collected/recorded due to the encounter was a telemedicine visit.   Location of patient (ex: home, work):  HOME  Patient consents to a telephone visit:  YES  Location of the provider (ex: office, home):  OFFICE  Name of any referring provider:  Sherrie Mustache, NP  Names of all persons participating in the telemedicine service and their role in the encounter:  PATIENT, [name], [place]. [facility] Avynn Klassen, NP  Time spent on call:  7:02   Provider: Breyah Akhter FNP-C  [name], NP  Patient Care Team: [name], NP as PCP - General (Geriatric Medicine) [name], MD as Consulting Physician (General Surgery) Paralee Cancel, MD as Consulting Physician (Orthopedic Surgery) Marica Otter, [place] (Optometry) Marcial Pacas, MD as Consulting Physician (Neurology) [name], MD as Consulting Physician (Cardiology) Ricard Dillon, MD (Psychiatry)  Extended Emergency Contact Information Primary Emergency Contact: [place] Phone: [phone] Relation: Daughter Secondary Emergency Contact: Newton,Doris Address: [address]          [place], [place] [address] [place] Phone: [phone] Relation: Other  Code Status:  DNR Goals of care: Advanced Directive information Advanced Directives 07/01/2019  Does Patient Have a Medical Advance Directive? Yes  Type of Advance Directive Living will;Out of facility DNR (pink MOST or yellow form)  Does patient want to make changes to medical advance directive? No - Patient declined  Copy of [place] in Chart? Yes - validated most recent copy scanned in chart (See row information)  Pre-existing out of facility DNR order (yellow form or pink MOST form) -     Chief Complaint  Patient presents with  . Acute Visit    Discuss  blood pressures    HPI:  Pt is a 86 y.o. female seen today for an acute visit for evaluation of blood pressure.she states blood pressure Saturday  was 177/96,HR 76 , rechecked 121/66,HR 67 on Sunday B/p 177/94,HR 63 and today B/p 160/93,HR 67 she states usually checks her Blood pressure on left arm but since she had her left mastectomy she has had to check on her right arm which seems to be 10 points higher than the left.Her blood previous SB/p have been in the 140's.she states previous dizziness has improved she took meclizine 25 mg tablet instead of 12.5 mg tablet as directed.states 25 mg works better.she denies any chest tightness,chest pain,palpitation or shortness of breath.     Past Medical History:  Diagnosis Date  . Arthritis   . Cancer [facility])    left breast cancer   . Cataracts, bilateral    removed by surgery  . CHF (congestive heart failure) ([place])    PACEMAKER & DEFIB  . Complication of anesthesia    hypotensive after back surgery in 2006  . Depression   . Dyslipidemia   . Fainted 04/21/06   AT [place]  . GERD (gastroesophageal reflux disease)   . Headache(784.0)   . Hearing loss    bilateral hearing aids  . HLD (hyperlipidemia)    diet controlled   . Hypertension   . Hypothyroidism   . ICD (implantable cardiac defibrillator) in place    pt has pacer/icd  . ICD (implantable cardiac defibrillator), biventricular, in situ   . LBBB (left bundle branch block)   . Memory loss   . Nonischemic cardiomyopathy (  [place])   . Normal coronary arteries    s/p cardiac cath 2007  . Pacemaker    ICD [employer]  . Syncope   . Systolic CHF ([place])   . Vertigo   . Wears glasses    Past Surgical History:  Procedure Laterality Date  . BACK SURGERY     lumbar fusion   . BREAST LUMPECTOMY Left 2008  . BREAST SURGERY  2000   LUMP REMOVAL. STAGE 1 CANCER  . CARDIAC CATHETERIZATION    . CATARACT EXTRACTION    . COLONOSCOPY    . EYE SURGERY    . IMPLANTABLE CARDIOVERTER  DEFIBRILLATOR GENERATOR CHANGE N/A 12/18/2012   Procedure: IMPLANTABLE CARDIOVERTER DEFIBRILLATOR GENERATOR CHANGE;  Surgeon: [name], MD;  Location: [facility] CATH LAB;  Service: Cardiovascular;  Laterality: N/A;  . JOINT REPLACEMENT  06/14/01   right  . LUMBAR FUSION  2006  . MASS EXCISION  11/08/2011   Procedure: EXCISION MASS;  Surgeon: [name], MD;  [facility];  Service: General;  Laterality: Left;  Excision Left Thigh Mass  . MASTECTOMY W/ SENTINEL NODE BIOPSY Left 06/04/2019   Procedure: LEFT MASTECTOMY WITH SENTINEL LYMPH NODE BIOPSY;  Surgeon: [name], MD;  Location: [place];  Service: General;  Laterality: Left;  Marland Kitchen MASTECTOMY, PARTIAL  2008   GOT PACEMAKER AND DEFIB AT THAT TIME  . PACEMAKER INSERTION  04/23/06  . TOTAL KNEE ARTHROPLASTY  05/17/01   RIGHT KNEE  . TOTAL KNEE ARTHROPLASTY Left 11/29/2014   Procedure: TOTAL LEFT KNEE ARTHROPLASTY;  Surgeon: Paralee Cancel, MD;  Location[facility];  Service: Orthopedics;  Laterality: Left;    Allergies  Allergen Reactions  . Iodine Shortness Of Breath    Iodine contrast, CHF , SOB  . Shellfish Allergy Shortness Of Breath  . Memantine     Malaise, fogginess/ couldn't think  . Aspirin Nausea And Vomiting  . Codeine Nausea And Vomiting    Outpatient Encounter Medications as of 07/06/2019  Medication Sig  . acetaminophen (TYLENOL) 650 MG CR tablet Take 650 mg by mouth every 8 (eight) hours as needed for pain.  Marland Kitchen ascorbic acid (VITAMIN C) 500 MG tablet Take 500 mg by mouth daily.  Marland Kitchen buPROPion (WELLBUTRIN XL) 150 MG 24 hr tablet Take 3 tablets (450 mg total) by mouth daily.  . carvedilol (COREG) 3.125 MG tablet TAKE 1 TABLET BY MOUTH  TWICE DAILY  . Cholecalciferol (VITAMIN D3) 50 MCG (2000 UT) TABS Take 2,000 Units by mouth daily.   Marland Kitchen Cod Liver Oil CAPS Take 1 capsule by mouth daily. 415 mg  . furosemide (LASIX) 20 MG tablet TAKE 1 TABLET BY MOUTH  DAILY  . levothyroxine (SYNTHROID, LEVOTHROID) 100 MCG tablet Take 1 tablet (100  mcg total) by mouth daily before breakfast.  . Magnesium 250 MG TABS Take 250 mg by mouth daily.  . meclizine (ANTIVERT) 12.5 MG tablet Take 1 tablet (12.5 mg total) by mouth 3 (three) times daily as needed for dizziness.  . methocarbamol (ROBAXIN) 500 MG tablet Take 1 tablet (500 mg total) by mouth every 8 (eight) hours as needed for muscle spasms.  . NONFORMULARY OR COMPOUNDED ITEM Diclofenac-Baclofen 2% [id number] 1-2 grams topically four times daily. Before application of cream rub 2 minutes to active max penetration. Quantity 120 grams  . omeprazole (PRILOSEC) 40 MG capsule TAKE 1 CAPSULE BY MOUTH  ONCE DAILY FOR STOMACH  . Oxcarbazepine (TRILEPTAL) 300 MG tablet Take 300 mg by mouth daily.   . potassium  chloride (KLOR-CON) 10 MEQ tablet TAKE 1 TABLET BY MOUTH  DAILY  . sertraline (ZOLOFT) 100 MG tablet Take 200 mg by mouth daily.   Marland Kitchen spironolactone (ALDACTONE) 25 MG tablet TAKE 1 TABLET BY MOUTH IN  THE MORNING  . Vitamin E 400 units TABS Take 400 Units by mouth daily.    No facility-administered encounter medications on file as of 07/06/2019.    Review of Systems  Constitutional: Negative for appetite change, chills, fatigue and fever.  HENT: Negative for congestion, rhinorrhea, sinus pressure, sinus pain, sneezing and sore throat.   Respiratory: Negative for cough, chest tightness, shortness of breath and wheezing.   Cardiovascular: Negative for chest pain, palpitations and leg swelling.  Gastrointestinal: Negative for abdominal distention, abdominal pain, constipation, diarrhea, nausea and vomiting.  Genitourinary: Negative for difficulty urinating, dysuria, flank pain, frequency and urgency.  Skin: Negative for color change, pallor and rash.  Neurological: Negative for speech difficulty, light-headedness and headaches.       Dizziness has improved with meclizine     Immunization History  Administered Date(s) Administered  . Influenza, High Dose Seasonal PF 01/16/2018, 12/08/2018   . Influenza-Unspecified 12/08/2013, 01/10/2015, 12/09/2015, 01/12/2017  . PFIZER SARS-COV-2 Vaccination 05/23/2019, 06/15/2019  . Pneumococcal Conjugate-13 04/09/2012  . Pneumococcal Polysaccharide-23 02/28/2016  . Tdap 01/07/2013  . Zoster 08/05/2014   Pertinent  Health Maintenance Due  Topic Date Due  . MAMMOGRAM  03/15/2020  . INFLUENZA VACCINE  Completed  . DEXA SCAN  Completed  . PNA vac Low Risk Adult  Completed   Fall Risk  07/01/2019 04/15/2019 01/15/2019 12/16/2018 11/24/2018  Falls in the past year? 1 1 0 0 0  Number falls in past yr: 0 0 - 0 0  Injury with Fall? 0 0 - 0 0  Comment - - - - -    There were no vitals filed for this visit. There is no height or weight on file to calculate BMI. Physical Exam Unable to complete on telephone visit.   Labs reviewed: Recent Labs    04/15/19 1119 06/01/19 1221 06/05/19 0342  NA 141 140 140  K 4.2 4.1 3.8  CL 105 103 105  CO2 29 28 27   GLUCOSE 85 85 91  BUN 21 24* 10  CREATININE 0.84 0.92 0.89  CALCIUM 10.1 10.3 8.6*  MG  --   --  2.1  PHOS  --   --  1.4*   Recent Labs    11/10/18 0904 04/15/19 1119 06/01/19 1221  AST 29 28 30   ALT 25 25 26   ALKPHOS  --   --  54  BILITOT 0.3 0.4 0.5  PROT 7.0 6.7 6.9  ALBUMIN  --   --  3.7   Recent Labs    11/10/18 0904 04/15/19 1119 06/01/19 1221  WBC 7.4 7.8 7.0  NEUTROABS 4,847 5,343 4.4  HGB 12.9 13.1 13.7  HCT 39.2 40.0 43.6  MCV 85.4 84.7 90.1  PLT 119* 121* 124*   Lab Results  Component Value Date   TSH 1.88 11/10/2018   No results found for: HGBA1C Lab Results  Component Value Date   CHOL 182 11/10/2018   HDL 60 11/10/2018   LDLCALC 104 (H) 11/10/2018   TRIG 88 11/10/2018   CHOLHDL 3.0 11/10/2018    Significant Diagnostic Results in last 30 days:  CUP PACEART INCLINIC DEVICE CHECK  Result Date: 06/16/2019 Device checked in clinic by industry. See scanned report. Follow up with [facility] in 1 year. Next remote 08/06/19.Leigh  Juleen China, BSN,  RN   Assessment/Plan  Essential hypertension Elevated blood pressure readings at home.Previous readings in the 140's Asymptomatic. - Instructed to start on Amlodipine 5 mg tablet one by mouth daily  - check B/p daily and record bring log to visit for evaluation in 2 weeks.  - amLODipine (NORVASC) 5 MG tablet; Take 1 tablet (5 mg total) by mouth daily.  Dispense: 30 tablet; Refill: 0 - Advised to notify provider if B/p > 140/90 or < 90/60  or go to ED if symptoms worsen  - Avoid adding extra salt in diet - DASH diet additional information provided on AVS   Family/ staff Communication: Reviewed plan of care with patient verbalized understanding.   Labs/tests ordered: None   Next Appointment: 2 weeks for follow up High blood pressure.   Spent 11 minutes of non-face to face with patient  I connected with  [name] on 07/06/19 by a video enabled telemedicine application and verified that I am speaking with the correct person using two identifiers.   I discussed the limitations of evaluation and management by telemedicine. The patient expressed understanding and agreed to proceed.    Sandrea Hughs, NP

## 2019-07-06 NOTE — Patient Instructions (Signed)
- Take  Amlodipine 5 mg tablet one by mouth daily  - check B/p daily and record bring log to visit for evaluation in 2 weeks.  - Advised to notify provider if B/p > 140/90 or < 90/60  or go to ED if symptoms worsen  - Avoid adding extra salt in diet  DASH Eating Plan DASH stands for "Dietary Approaches to Stop Hypertension." The DASH eating plan is a healthy eating plan that has been shown to reduce high blood pressure (hypertension). It may also reduce your risk for type 2 diabetes, heart disease, and stroke. The DASH eating plan may also help with weight loss. What are tips for following this plan?  General guidelines  Avoid eating more than 2,300 mg (milligrams) of salt (sodium) a day. If you have hypertension, you may need to reduce your sodium intake to 1,500 mg a day.  Limit alcohol intake to no more than 1 drink a day for nonpregnant women and 2 drinks a day for men. One drink equals 12 oz of beer, 5 oz of wine, or 1 oz of hard liquor.  Work with your health care provider to maintain a healthy body weight or to lose weight. Ask what an ideal weight is for you.  Get at least 30 minutes of exercise that causes your heart to beat faster (aerobic exercise) most days of the week. Activities may include walking, swimming, or biking.  Work with your health care provider or diet and nutrition specialist (dietitian) to adjust your eating plan to your individual calorie needs. Reading food labels   Check food labels for the amount of sodium per serving. Choose foods with less than 5 percent of the Daily Value of sodium. Generally, foods with less than 300 mg of sodium per serving fit into this eating plan.  To find whole grains, look for the word "whole" as the first word in the ingredient list. Shopping  Buy products labeled as "low-sodium" or "no salt added."  Buy fresh foods. Avoid canned foods and premade or frozen meals. Cooking  Avoid adding salt when cooking. Use salt-free  seasonings or herbs instead of table salt or sea salt. Check with your health care provider or pharmacist before using salt substitutes.  Do not fry foods. Cook foods using healthy methods such as baking, boiling, grilling, and broiling instead.  Cook with heart-healthy oils, such as olive, canola, soybean, or sunflower oil. Meal planning  Eat a balanced diet that includes: ? 5 or more servings of fruits and vegetables each day. At each meal, try to fill half of your plate with fruits and vegetables. ? Up to 6-8 servings of whole grains each day. ? Less than 6 oz of lean meat, poultry, or fish each day. A 3-oz serving of meat is about the same size as a deck of cards. One egg equals 1 oz. ? 2 servings of low-fat dairy each day. ? A serving of nuts, seeds, or beans 5 times each week. ? Heart-healthy fats. Healthy fats called Omega-3 fatty acids are found in foods such as flaxseeds and coldwater fish, like sardines, salmon, and mackerel.  Limit how much you eat of the following: ? Canned or prepackaged foods. ? Food that is high in trans fat, such as fried foods. ? Food that is high in saturated fat, such as fatty meat. ? Sweets, desserts, sugary drinks, and other foods with added sugar. ? Full-fat dairy products.  Do not salt foods before eating.  Try to eat  at least 2 vegetarian meals each week.  Eat more home-cooked food and less restaurant, buffet, and fast food.  When eating at a restaurant, ask that your food be prepared with less salt or no salt, if possible. What foods are recommended? The items listed may not be a complete list. Talk with your dietitian about what dietary choices are best for you. Grains Whole-grain or whole-wheat bread. Whole-grain or whole-wheat pasta. Brown rice. [place]row. Bulgur. Whole-grain and low-sodium cereals. Pita bread. Low-fat, low-sodium crackers. Whole-wheat flour tortillas. Vegetables Fresh or frozen vegetables (raw, steamed, roasted, or  grilled). Low-sodium or reduced-sodium tomato and vegetable juice. Low-sodium or reduced-sodium tomato sauce and tomato paste. Low-sodium or reduced-sodium canned vegetables. Fruits All fresh, dried, or frozen fruit. Canned fruit in natural juice (without added sugar). Meat and other protein foods Skinless chicken or [place]. Ground chicken or [place]. Pork with fat trimmed off. Fish and seafood. Egg whites. Dried beans, peas, or lentils. Unsalted nuts, nut butters, and seeds. Unsalted canned beans. Lean cuts of beef with fat trimmed off. Low-sodium, lean deli meat. Dairy Low-fat (1%) or fat-free (skim) milk. Fat-free, low-fat, or reduced-fat cheeses. Nonfat, low-sodium ricotta or cottage cheese. Low-fat or nonfat yogurt. Low-fat, low-sodium cheese. Fats and oils Soft margarine without trans fats. Vegetable oil. Low-fat, reduced-fat, or light mayonnaise and salad dressings (reduced-sodium). Canola, safflower, olive, soybean, and sunflower oils. Avocado. Seasoning and other foods Herbs. Spices. Seasoning mixes without salt. Unsalted popcorn and pretzels. Fat-free sweets. What foods are not recommended? The items listed may not be a complete list. Talk with your dietitian about what dietary choices are best for you. Grains Baked goods made with fat, such as croissants, muffins, or some breads. Dry pasta or rice meal packs. Vegetables Creamed or fried vegetables. Vegetables in a cheese sauce. Regular canned vegetables (not low-sodium or reduced-sodium). Regular canned tomato sauce and paste (not low-sodium or reduced-sodium). Regular tomato and vegetable juice (not low-sodium or reduced-sodium). Angie Fava. Olives. Fruits Canned fruit in a light or heavy syrup. Fried fruit. Fruit in cream or butter sauce. Meat and other protein foods Fatty cuts of meat. Ribs. Fried meat. Berniece Salines. Sausage. Bologna and other processed lunch meats. Salami. Fatback. Hotdogs. Bratwurst. Salted nuts and seeds. Canned beans with  added salt. Canned or smoked fish. Whole eggs or egg yolks. Chicken or [place] with skin. Dairy Whole or 2% milk, cream, and half-and-half. Whole or full-fat cream cheese. Whole-fat or sweetened yogurt. Full-fat cheese. Nondairy creamers. Whipped toppings. Processed cheese and cheese spreads. Fats and oils Butter. Stick margarine. Lard. Shortening. Ghee. Bacon fat. Tropical oils, such as coconut, palm kernel, or palm oil. Seasoning and other foods Salted popcorn and pretzels. Onion salt, garlic salt, seasoned salt, table salt, and sea salt. Worcestershire sauce. Tartar sauce. Barbecue sauce. Teriyaki sauce. Soy sauce, including reduced-sodium. Steak sauce. Canned and packaged gravies. Fish sauce. Oyster sauce. Cocktail sauce. Horseradish that you find on the shelf. Ketchup. Mustard. Meat flavorings and tenderizers. Bouillon cubes. Hot sauce and Tabasco sauce. Premade or packaged marinades. Premade or packaged taco seasonings. Relishes. Regular salad dressings. Where to find more information:  National Heart, Lung, and [place]: https://wilson-eaton.com/  American Heart Association: www.heart.org Summary  The DASH eating plan is a healthy eating plan that has been shown to reduce high blood pressure (hypertension). It may also reduce your risk for type 2 diabetes, heart disease, and stroke.  With the DASH eating plan, you should limit salt (sodium) intake to 2,300 mg a day. If you have hypertension,  you may need to reduce your sodium intake to 1,500 mg a day.  When on the DASH eating plan, aim to eat more fresh fruits and vegetables, whole grains, lean proteins, low-fat dairy, and heart-healthy fats.  Work with your health care provider or diet and nutrition specialist (dietitian) to adjust your eating plan to your individual calorie needs. This information is not intended to replace advice given to you by your health care provider. Make sure you discuss any questions you have with your health care  provider. Document Revised: 03/08/2017 Document Reviewed: 03/19/2016 Elsevier Patient Education  2020 [employer].

## 2019-07-07 ENCOUNTER — Ambulatory Visit: Payer: Medicare Other | Admitting: Physical Therapy

## 2019-07-07 ENCOUNTER — Encounter: Payer: Self-pay | Admitting: Physical Therapy

## 2019-07-07 DIAGNOSIS — Z483 Aftercare following surgery for neoplasm: Secondary | ICD-10-CM | POA: Diagnosis not present

## 2019-07-07 DIAGNOSIS — I972 Postmastectomy lymphedema syndrome: Secondary | ICD-10-CM

## 2019-07-07 DIAGNOSIS — M25612 Stiffness of left shoulder, not elsewhere classified: Secondary | ICD-10-CM

## 2019-07-07 DIAGNOSIS — M25512 Pain in left shoulder: Secondary | ICD-10-CM

## 2019-07-07 NOTE — Therapy (Signed)
[place], [place], [address] Phone: [phone]   Fax:  [phone]  Physical Therapy Treatment  Patient Details  Name: [name] MRN: [id number] Date of Birth: 12/28/1933 Referring Provider (PT): Dr. Irene Limbo  (Dr Barry Dienes is surgeon)   Encounter Date: 07/07/2019  PT End of Session - 07/07/19 1747    Visit Number  3    Number of Visits  17    Date for PT Re-Evaluation  08/31/19    PT Start Time  1400    PT Stop Time  1445    PT Time Calculation (min)  45 min    Activity Tolerance  Patient tolerated treatment well    Behavior During Therapy  [facility] for tasks assessed/performed       Past Medical History:  Diagnosis Date  . Arthritis   . Cancer [facility])    left breast cancer   . Cataracts, bilateral    removed by surgery  . CHF (congestive heart failure) ([place])    PACEMAKER & DEFIB  . Complication of anesthesia    hypotensive after back surgery in 2006  . Depression   . Dyslipidemia   . Fainted 04/21/06   AT [place]  . GERD (gastroesophageal reflux disease)   . Headache(784.0)   . Hearing loss    bilateral hearing aids  . HLD (hyperlipidemia)    diet controlled   . Hypertension   . Hypothyroidism   . ICD (implantable cardiac defibrillator) in place    pt has pacer/icd  . ICD (implantable cardiac defibrillator), biventricular, in situ   . LBBB (left bundle branch block)   . Memory loss   . Nonischemic cardiomyopathy ([place])   . Normal coronary arteries    s/p cardiac cath 2007  . Pacemaker    ICD [employer]  . Syncope   . Systolic CHF ([place])   . Vertigo   . Wears glasses     Past Surgical History:  Procedure Laterality Date  . BACK SURGERY     lumbar fusion   . BREAST LUMPECTOMY Left 2008  . BREAST SURGERY  2000   LUMP REMOVAL. STAGE 1 CANCER  . CARDIAC CATHETERIZATION    . CATARACT EXTRACTION    . COLONOSCOPY    . EYE SURGERY    . IMPLANTABLE CARDIOVERTER  DEFIBRILLATOR GENERATOR CHANGE N/A 12/18/2012   Procedure: IMPLANTABLE CARDIOVERTER DEFIBRILLATOR GENERATOR CHANGE;  Surgeon: [name], MD;  Location: [facility] CATH LAB;  Service: Cardiovascular;  Laterality: N/A;  . JOINT REPLACEMENT  06/14/01   right  . LUMBAR FUSION  2006  . MASS EXCISION  11/08/2011   Procedure: EXCISION MASS;  Surgeon: [name], MD;  Location: [facility] OR[facility];  Service: General;  Laterality: Left;  Excision Left Thigh Mass  . MASTECTOMY W/ SENTINEL NODE BIOPSY Left 06/04/2019   Procedure: LEFT MASTECTOMY WITH SENTINEL LYMPH NODE BIOPSY;  Surgeon: [name], MD;  Location: [facility];  Service: General;  Laterality: Left;  Marland Kitchen MASTECTOMY, PARTIAL  2008   GOT PACEMAKER AND DEFIB AT THAT TIME  . PACEMAKER INSERTION  04/23/06  . TOTAL KNEE ARTHROPLASTY  05/17/01   RIGHT KNEE  . TOTAL KNEE ARTHROPLASTY Left 11/29/2014   Procedure: TOTAL LEFT KNEE ARTHROPLASTY;  Surgeon: Paralee Cancel, MD;  Location: [facility] OR[facility];  Service: Orthopedics;  Laterality: Left;    There were no vitals filed for this visit.  Subjective Assessment - 07/07/19 1408    Subjective  Pt states she has been  having some vertigo. She is taking meclizine and that helps her sometimes. She has been wearing her tg soft but her hand is swollen today    Pertinent History  left breast cancer with left mastectomy on 06/04/2019 with 5 lymph nodes . past history includes lumpectomy in 2008 with radiation and no lymph nodes removed she has chronic left breast pain and seroma with excisional biopsy in 2011.  Hx includes CHR with implanted defibrillator in left chest, bilateral TKR, osteoporosis    Patient Stated Goals  be able to lift my arm better to comb my hair    Currently in Pain?  Yes    Pain Score  5     Pain Location  Axilla    Pain Orientation  Left;Lateral    Pain Descriptors / Indicators  Aching    Pain Type  Surgical pain                       [facility] Adult PT Treatment/Exercise - 07/07/19 0001      Manual  Therapy   Manual Therapy  Myofascial release;Manual Lymphatic Drainage (MLD);Passive ROM    Manual therapy comments  reinforced pt instruction to keep hand elevated and do exercises for hand to decrease edema     Edema Management  small tg soft to hand only    Myofascial Release  gently to cording in axilla     Manual Lymphatic Drainage (MLD)  In Supine: Short neck, 5 diaphragmatic breaths, Lt inguinal nodes and Lt axillo-inguinal anastomosis (avoided anterior inter-axillary due to pacemaker location), then Lt UE working from lateral upper arm to dorsal hand working from proximal to distal then retracing all steps.  extra time spent on hand .    Passive ROM  to left shoudler with elbow flexed to allow greater Temperance movement with less pull on the cording              PT Education - 07/07/19 1747    Education Details  elevation of hand with exercises to decrease edema.  Do not wear tg soft on arm and use it on hand only as needed to decrease swelling in hand    Person(s) Educated  Patient    Methods  Explanation    Comprehension  Verbalized understanding       PT Short Term Goals - 06/30/19 1819      PT SHORT TERM GOAL #1   Title  Pt  will report pain in left shoulder is decreased to 6/10    Baseline  8/10 on 06/30/2019    Time  4    Status  New      PT SHORT TERM GOAL #2   Title  Pt wil be independent in a basic HEP for shoulder ROM    Time  4    Period  Weeks    Status  New        PT Long Term Goals - 06/30/19 1820      PT LONG TERM GOAL #1   Title  Pt will have reduction in left forearm at 15 cm proximla to ulnar styloid by 1 cm    Time  8    Period  Weeks    Status  New      PT LONG TERM GOAL #2   Title  Pt and daugher will report they understand lymphedema risk reduction practices and how to manage left arm lymphedema at home    Time  8  Period  Weeks    Status  New      PT LONG TERM GOAL #3   Title  Pt will have 140 degrees of painfree left shoulder abduction  so that she can easily perfom her ADLs at home    Baseline  115 and painful on 06/30/2019    Time  8    Period  Weeks    Status  New      PT LONG TERM GOAL #4   Title  Patient will be Independent with HEP for shoulder ROM and strength    Time  8    Period  Weeks    Status  New            Plan - 07/07/19 1452    Clinical Impression Statement  Pt has visible swelling in her left and and cording in her left axilla. The rest of her arm looks better.  Pt had 2 wedding rings stuck on her middle finder of the left hand. After MLD, they were looser so assisted pt to remove them and she placed them in her wallet.  She will  not wear the tg soft on her arm, but put a small piece on her hand to help reduce the swelling there.  She reported feeling better after session and was able to use her arm more.    Personal Factors and Comorbidities  Age;Comorbidity 3+    Comorbidities  previous triple negataive left breast cancer, seroma after lumpectomy, CHF with defibrillator, Bilateral TKR osteoporosis    Examination-Activity Limitations  Reach Overhead;Bathing    Stability/Clinical Decision Making  Stable/Uncomplicated    Rehab Potential  Good    PT Frequency  2x / week    PT Treatment/Interventions  ADLs/Self Care Home Management;DME Instruction;Therapeutic activities;Patient/family education;Orthotic Fit/Training;Manual techniques;Manual lymph drainage;Compression bandaging;Scar mobilization;Taping;Passive range of motion    PT Next Visit Plan  cont MLD to left arm and gentle ROM being mindfull of defibrillator, progress exercise as pain decreases; manual techniques as needed for cording in left axilla    PT Home Exercise Plan  Supine dowel exercises    Consulted and Agree with Plan of Care  Patient       Patient will benefit from skilled therapeutic intervention in order to improve the following deficits and impairments:  Decreased activity tolerance, Decreased knowledge of precautions, Decreased  knowledge of use of DME, Decreased range of motion, Decreased strength, Increased fascial restricitons, Increased edema, Impaired perceived functional ability, Increased muscle spasms, Impaired UE functional use, Postural dysfunction, Pain  Visit Diagnosis: Aftercare following surgery for neoplasm  Acute pain of left shoulder  Stiffness of left shoulder joint  Postmastectomy lymphedema     Problem List Patient Active Problem List   Diagnosis Date Noted  . Status post left mastectomy 07/01/2019  . Breast cancer of lower-outer quadrant of left female breast ([place]) 06/04/2019  . Candida infection, oral 01/15/2019  . Senile purpura ([place]) 04/14/2018  . Lumbar post-laminectomy syndrome 12/03/2017  . Lumbar spondylosis 12/03/2017  . History of back surgery 09/01/2017  . Constipation 09/01/2017  . Hypothyroidism due to acquired atrophy of thyroid 09/01/2017  . Mixed hyperlipidemia 09/01/2017  . Age-related osteoporosis without current pathological fracture 09/01/2017  . High risk medication use 09/01/2017  . Arthritis of hand 05/17/2017  . Atherosclerosis of native arteries of extremity with intermittent claudication ([place]) 05/15/2017  . Status post total bilateral knee replacement 12/20/2016  . Gait abnormality 07/16/2016  . Chronic low back pain 07/16/2016  .  Mild cognitive impairment 07/16/2016  . Wrist pain 05/10/2015  . S/P left TKA 11/29/2014  . S/P knee replacement 11/29/2014  . Spontaneous bruising 08/27/2014  . Numbness and tingling in right hand 07/28/2014  . Essential tremor 07/28/2014  . Dizziness 05/28/2014  . Shaky 05/28/2014  . Memory loss 05/28/2014  . Depression 05/06/2014  . Lipoma of left upper thigh 3x5 cm 09/28/2011  . Cerebral vascular accident ([place]) 08/09/2011  . Syncope 08/09/2011  . History of breast cancer [id number], s/p BCT 2008, triple negative 01/26/2011  . Chronic L breast pain with chronic recurrent seroma, s/p excisional biopsy 01/12/2010 01/26/2011   . Fainted   . ICD (implantable cardioverter-defibrillator), biventricular, in situ 08/02/2010  . Chronic systolic heart failure ([place]) 08/02/2010  . Hypertension 08/02/2010   Donato Heinz. Owens Shark PT  Norwood Levo 07/07/2019, 5:50 PM  [place], [place], [address] Phone: [phone]   Fax:  [phone]  Name: [name] MRN: [id number] Date of Birth: 08-Sep-1933

## 2019-07-10 ENCOUNTER — Other Ambulatory Visit: Payer: Medicare Other

## 2019-07-13 ENCOUNTER — Other Ambulatory Visit: Payer: Self-pay

## 2019-07-13 ENCOUNTER — Ambulatory Visit: Payer: Medicare Other | Attending: Hematology

## 2019-07-13 DIAGNOSIS — I972 Postmastectomy lymphedema syndrome: Secondary | ICD-10-CM | POA: Diagnosis present

## 2019-07-13 DIAGNOSIS — M25512 Pain in left shoulder: Secondary | ICD-10-CM | POA: Insufficient documentation

## 2019-07-13 DIAGNOSIS — M25612 Stiffness of left shoulder, not elsewhere classified: Secondary | ICD-10-CM | POA: Insufficient documentation

## 2019-07-13 DIAGNOSIS — Z483 Aftercare following surgery for neoplasm: Secondary | ICD-10-CM | POA: Diagnosis not present

## 2019-07-13 NOTE — Therapy (Signed)
[place], [place], [address] Phone: [phone]   Fax:  [phone]  Physical Therapy Treatment  Patient Details  Name: [name] MRN: [id number] Date of Birth: 01/23/1934 Referring Provider (PT): Dr. Irene Limbo  (Dr Barry Dienes is surgeon)   Encounter Date: 07/13/2019  PT End of Session - 07/13/19 1505    Visit Number  4    Number of Visits  17    Date for PT Re-Evaluation  08/31/19    PT Start Time  [id number]    PT Stop Time  1503    PT Time Calculation (min)  58 min    Activity Tolerance  Patient tolerated treatment well    Behavior During Therapy  [facility] for tasks assessed/performed       Past Medical History:  Diagnosis Date  . Arthritis   . Cancer [facility])    left breast cancer   . Cataracts, bilateral    removed by surgery  . CHF (congestive heart failure) ([place])    PACEMAKER & DEFIB  . Complication of anesthesia    hypotensive after back surgery in 2006  . Depression   . Dyslipidemia   . Fainted 04/21/06   AT [place]  . GERD (gastroesophageal reflux disease)   . Headache(784.0)   . Hearing loss    bilateral hearing aids  . HLD (hyperlipidemia)    diet controlled   . Hypertension   . Hypothyroidism   . ICD (implantable cardiac defibrillator) in place    pt has pacer/icd  . ICD (implantable cardiac defibrillator), biventricular, in situ   . LBBB (left bundle branch block)   . Memory loss   . Nonischemic cardiomyopathy ([place])   . Normal coronary arteries    s/p cardiac cath 2007  . Pacemaker    ICD [employer]  . Syncope   . Systolic CHF ([place])   . Vertigo   . Wears glasses     Past Surgical History:  Procedure Laterality Date  . BACK SURGERY     lumbar fusion   . BREAST LUMPECTOMY Left 2008  . BREAST SURGERY  2000   LUMP REMOVAL. STAGE 1 CANCER  . CARDIAC CATHETERIZATION    . CATARACT EXTRACTION    . COLONOSCOPY    . EYE SURGERY    . IMPLANTABLE CARDIOVERTER  DEFIBRILLATOR GENERATOR CHANGE N/A 12/18/2012   Procedure: IMPLANTABLE CARDIOVERTER DEFIBRILLATOR GENERATOR CHANGE;  Surgeon: [name], MD;  Location: [facility] CATH LAB;  Service: Cardiovascular;  Laterality: N/A;  . JOINT REPLACEMENT  06/14/01   right  . LUMBAR FUSION  2006  . MASS EXCISION  11/08/2011   Procedure: EXCISION MASS;  Surgeon: [name], MD;  [facility];  Service: General;  Laterality: Left;  Excision Left Thigh Mass  . MASTECTOMY W/ SENTINEL NODE BIOPSY Left 06/04/2019   Procedure: LEFT MASTECTOMY WITH SENTINEL LYMPH NODE BIOPSY;  Surgeon: [name], MD;  Location: [place];  Service: General;  Laterality: Left;  Marland Kitchen MASTECTOMY, PARTIAL  2008   GOT PACEMAKER AND DEFIB AT THAT TIME  . PACEMAKER INSERTION  04/23/06  . TOTAL KNEE ARTHROPLASTY  05/17/01   RIGHT KNEE  . TOTAL KNEE ARTHROPLASTY Left 11/29/2014   Procedure: TOTAL LEFT KNEE ARTHROPLASTY;  Surgeon: Paralee Cancel, MD;  [facility];  Service: Orthopedics;  Laterality: Left;    There were no vitals filed for this visit.  Subjective Assessment - 07/13/19 1411    Subjective  My dizziness is really being  persistent this time around and I'm very frustrated by it. Normally when I have these episodes they don't last as long. I've been wearing the TG soft on my hand and it's going pretty well. I do have to take it off at the end of the day as it jst becomes itchy. And my Rt shoulder motion is improving.    Pertinent History  left breast cancer with left mastectomy on 06/04/2019 with 5 lymph nodes . past history includes lumpectomy in 2008 with radiation and no lymph nodes removed she has chronic left breast pain and seroma with excisional biopsy in 2011.  Hx includes CHR with implanted defibrillator in left chest, bilateral TKR, osteoporosis    Patient Stated Goals  be able to lift my arm better to comb my hair    Currently in Pain?  No/denies                       [facility] Adult PT Treatment/Exercise - 07/13/19  0001      Shoulder Exercises: Pulleys   Flexion  2 minutes    Flexion Limitations  Pt returned therapist demo    ABduction  2 minutes    ABduction Limitations  Pt returned therapist demo      Manual Therapy   Myofascial Release  gently to cording in axilla     Manual Lymphatic Drainage (MLD)  In Supine: Short neck, 5 diaphragmatic breaths, Lt inguinal nodes and Lt axillo-inguinal anastomosis (avoided anterior inter-axillary due to pacemaker location), then Lt UE working from lateral upper arm to dorsal hand working from proximal to distal then retracing all steps.  extra time spent on hand .    Passive ROM  to left shoudler with elbow flexed to allow greater Branch movement with less pull on the cording                PT Short Term Goals - 06/30/19 1819      PT SHORT TERM GOAL #1   Title  Pt  will report pain in left shoulder is decreased to 6/10    Baseline  8/10 on 06/30/2019    Time  4    Status  New      PT SHORT TERM GOAL #2   Title  Pt wil be independent in a basic HEP for shoulder ROM    Time  4    Period  Weeks    Status  New        PT Long Term Goals - 06/30/19 1820      PT LONG TERM GOAL #1   Title  Pt will have reduction in left forearm at 15 cm proximla to ulnar styloid by 1 cm    Time  8    Period  Weeks    Status  New      PT LONG TERM GOAL #2   Title  Pt and daugher will report they understand lymphedema risk reduction practices and how to manage left arm lymphedema at home    Time  8    Period  Weeks    Status  New      PT LONG TERM GOAL #3   Title  Pt will have 140 degrees of painfree left shoulder abduction so that she can easily perfom her ADLs at home    Baseline  115 and painful on 06/30/2019    Time  8    Period  Weeks    Status  New  PT LONG TERM GOAL #4   Title  Patient will be Independent with HEP for shoulder ROM and strength    Time  8    Period  Weeks    Status  New            Plan - 07/13/19 1505    Clinical  Impression Statement  Pt continues to present with vertigo requiring min-mod HHA when transitioning from either supine-sit or sit-stand and when walking down hall after session. Suggested to her and daughter to update doctor that no change with vertigo and maybe worse since starting new med. They verbalized understanding. Her Rt UE ROM though is improving well and pt demonstrates being able to lift arm higher. Progressed her to include AA/ROM with pulleys which she tolerated very well today repotring feeling good stretches with both positions.    Personal Factors and Comorbidities  Age;Comorbidity 3+    Comorbidities  previous triple negataive left breast cancer, seroma after lumpectomy, CHF with defibrillator, Bilateral TKR osteoporosis    Examination-Activity Limitations  Reach Overhead;Bathing    Stability/Clinical Decision Making  Stable/Uncomplicated    Rehab Potential  Good    PT Frequency  2x / week    PT Duration  8 weeks    PT Treatment/Interventions  ADLs/Self Care Home Management;DME Instruction;Therapeutic activities;Patient/family education;Orthotic Fit/Training;Manual techniques;Manual lymph drainage;Compression bandaging;Scar mobilization;Taping;Passive range of motion    PT Next Visit Plan  cont MLD to left arm and gentle ROM being mindfull of defibrillator, progress exercise as pain decreases; manual techniques as needed for cording in left axilla    PT Home Exercise Plan  Supine dowel exercises    Consulted and Agree with Plan of Care  Patient       Patient will benefit from skilled therapeutic intervention in order to improve the following deficits and impairments:  Decreased activity tolerance, Decreased knowledge of precautions, Decreased knowledge of use of DME, Decreased range of motion, Decreased strength, Increased fascial restricitons, Increased edema, Impaired perceived functional ability, Increased muscle spasms, Impaired UE functional use, Postural dysfunction,  Pain  Visit Diagnosis: Aftercare following surgery for neoplasm  Acute pain of left shoulder  Stiffness of left shoulder joint  Postmastectomy lymphedema     Problem List Patient Active Problem List   Diagnosis Date Noted  . Status post left mastectomy 07/01/2019  . Breast cancer of lower-outer quadrant of left female breast ([place]) 06/04/2019  . Candida infection, oral 01/15/2019  . Senile purpura ([place]) 04/14/2018  . Lumbar post-laminectomy syndrome 12/03/2017  . Lumbar spondylosis 12/03/2017  . History of back surgery 09/01/2017  . Constipation 09/01/2017  . Hypothyroidism due to acquired atrophy of thyroid 09/01/2017  . Mixed hyperlipidemia 09/01/2017  . Age-related osteoporosis without current pathological fracture 09/01/2017  . High risk medication use 09/01/2017  . Arthritis of hand 05/17/2017  . Atherosclerosis of native arteries of extremity with intermittent claudication ([place]) 05/15/2017  . Status post total bilateral knee replacement 12/20/2016  . Gait abnormality 07/16/2016  . Chronic low back pain 07/16/2016  . Mild cognitive impairment 07/16/2016  . Wrist pain 05/10/2015  . S/P left TKA 11/29/2014  . S/P knee replacement 11/29/2014  . Spontaneous bruising 08/27/2014  . Numbness and tingling in right hand 07/28/2014  . Essential tremor 07/28/2014  . Dizziness 05/28/2014  . Shaky 05/28/2014  . Memory loss 05/28/2014  . Depression 05/06/2014  . Lipoma of left upper thigh 3x5 cm 09/28/2011  . Cerebral vascular accident ([place]) 08/09/2011  . Syncope 08/09/2011  .  History of breast cancer [id number], s/p BCT 2008, triple negative 01/26/2011  . Chronic L breast pain with chronic recurrent seroma, s/p excisional biopsy 01/12/2010 01/26/2011  . Fainted   . ICD (implantable cardioverter-defibrillator), biventricular, in situ 08/02/2010  . Chronic systolic heart failure ([place]) 08/02/2010  . Hypertension 08/02/2010    Otelia Limes, PTA 07/13/2019, 5:36  PM  [place], [place], [address] Phone: [phone]   Fax:  [phone]  Name: [name] MRN: [id number] Date of Birth: Nov 26, 1933

## 2019-07-15 ENCOUNTER — Ambulatory Visit: Payer: Medicare Other

## 2019-07-15 ENCOUNTER — Ambulatory Visit: Payer: Self-pay | Admitting: Family

## 2019-07-17 ENCOUNTER — Ambulatory Visit: Payer: Medicare Other

## 2019-07-17 ENCOUNTER — Ambulatory Visit (INDEPENDENT_AMBULATORY_CARE_PROVIDER_SITE_OTHER): Payer: Medicare Other | Admitting: Nurse Practitioner

## 2019-07-17 ENCOUNTER — Other Ambulatory Visit: Payer: Self-pay

## 2019-07-17 ENCOUNTER — Encounter: Payer: Self-pay | Admitting: Nurse Practitioner

## 2019-07-17 VITALS — BP 122/80 | HR 74 | Temp 97.7°F | Ht 59.0 in | Wt 121.0 lb

## 2019-07-17 DIAGNOSIS — M25612 Stiffness of left shoulder, not elsewhere classified: Secondary | ICD-10-CM

## 2019-07-17 DIAGNOSIS — F3341 Major depressive disorder, recurrent, in partial remission: Secondary | ICD-10-CM

## 2019-07-17 DIAGNOSIS — I70213 Atherosclerosis of native arteries of extremities with intermittent claudication, bilateral legs: Secondary | ICD-10-CM

## 2019-07-17 DIAGNOSIS — Z483 Aftercare following surgery for neoplasm: Secondary | ICD-10-CM | POA: Diagnosis not present

## 2019-07-17 DIAGNOSIS — I890 Lymphedema, not elsewhere classified: Secondary | ICD-10-CM

## 2019-07-17 DIAGNOSIS — R42 Dizziness and giddiness: Secondary | ICD-10-CM | POA: Diagnosis not present

## 2019-07-17 DIAGNOSIS — R413 Other amnesia: Secondary | ICD-10-CM

## 2019-07-17 DIAGNOSIS — I10 Essential (primary) hypertension: Secondary | ICD-10-CM | POA: Diagnosis not present

## 2019-07-17 DIAGNOSIS — M25512 Pain in left shoulder: Secondary | ICD-10-CM

## 2019-07-17 DIAGNOSIS — Z9012 Acquired absence of left breast and nipple: Secondary | ICD-10-CM

## 2019-07-17 DIAGNOSIS — I972 Postmastectomy lymphedema syndrome: Secondary | ICD-10-CM

## 2019-07-17 NOTE — Patient Instructions (Addendum)
To stop robaxin if it is not helpful for pain Can use tylenol 500 mg 1-2 tablets every 8 hours as needed  Continue norvasc 5 mg by mouth daily for blood pressure  CT of the head ordered due to memory loss and and dizzy  Would recommend PT to see if they can help with dizziness  Can use meclizine 1/2 tablet every 4 hours as needed for dizziness (monitor and hold for sleepiness)

## 2019-07-17 NOTE — Progress Notes (Signed)
Careteam: Patient Care Team: [name], NP as PCP - General (Geriatric Medicine) [name], MD as Consulting Physician (General Surgery) Paralee Cancel, MD as Consulting Physician (Orthopedic Surgery) Marica Otter, [place] (Optometry) Marcial Pacas, MD as Consulting Physician (Neurology) [name], MD as Consulting Physician (Cardiology) Ricard Dillon, MD (Psychiatry)  PLACE OF SERVICE:  [place] Directive information Does Patient Have a Medical Advance Directive?: Yes, Type of Advance Directive: [place];Out of facility DNR (pink MOST or yellow form), Pre-existing out of facility DNR order (yellow form or pink MOST form): Yellow form placed in chart (order not valid for inpatient use), Does patient want to make changes to medical advance directive?: No - Patient declined  Allergies  Allergen Reactions  . Iodine Shortness Of Breath    Iodine contrast, CHF , SOB  . Shellfish Allergy Shortness Of Breath  . Memantine     Malaise, fogginess/ couldn't think  . Aspirin Nausea And Vomiting  . Codeine Nausea And Vomiting    Chief Complaint  Patient presents with  . Medical Management of Chronic Issues    3 month follow-up. Patient with B/P cuff for compairson   . Dizziness    Patient c/o virtego   . Advanced Directive    Reactivate DNR order      HPI: Patient is a 86 y.o. female for routine follow up.   Reports ongoing dizziness daily reports meclizine helps a little. Room is spinning, some blurred vision with this and worse when she moves her head Reports this has been ongoing since mastectomy.  Reports she is staggering and has occasional headaches. She reports she was staggering prior to but now worse.   Breast cancer- s/p mastectomy and now following with surgery and oncology.   htn- stable with addition of norvasc. No LE edema.   Depression and anxiety- seeing psych who is refilling her medication. Continues to take  Wellbutrin 450 mg and Zoloft 200 mg   GERD- worse, now she has changed her routine to take medication earlier and feels like it has improved.   Memory loss- getting worse. Having trouble finding words at time, forgetful over events. Has follow up with neurologist next week.   Review of Systems:  Review of Systems  Constitutional: Negative for chills, fever and weight loss.  HENT: Negative for tinnitus.   Respiratory: Negative for cough, sputum production and shortness of breath.   Cardiovascular: Negative for chest pain, palpitations and leg swelling.  Gastrointestinal: Negative for abdominal pain, constipation, diarrhea and heartburn.  Genitourinary: Negative for dysuria, frequency and urgency.  Musculoskeletal: Negative for back pain, falls, joint pain and myalgias.  Skin: Negative.   Neurological: Positive for dizziness. Negative for headaches.  Psychiatric/Behavioral: Positive for memory loss. Negative for depression. The patient does not have insomnia.     Past Medical History:  Diagnosis Date  . Arthritis   . Cancer [facility])    left breast cancer   . Cataracts, bilateral    removed by surgery  . CHF (congestive heart failure) ([place])    PACEMAKER & DEFIB  . Complication of anesthesia    hypotensive after back surgery in 2006  . Depression   . Dyslipidemia   . Fainted 04/21/06   AT [place]  . GERD (gastroesophageal reflux disease)   . Headache(784.0)   . Hearing loss    bilateral hearing aids  . HLD (hyperlipidemia)    diet controlled   . Hypertension   .  Hypothyroidism   . ICD (implantable cardiac defibrillator) in place    pt has pacer/icd  . ICD (implantable cardiac defibrillator), biventricular, in situ   . LBBB (left bundle branch block)   . Memory loss   . Nonischemic cardiomyopathy ([place])   . Normal coronary arteries    s/p cardiac cath 2007  . Pacemaker    ICD [employer]  . Syncope   . Systolic CHF ([place])   . Vertigo   . Wears glasses    Past  Surgical History:  Procedure Laterality Date  . BACK SURGERY     lumbar fusion   . BREAST LUMPECTOMY Left 2008  . BREAST SURGERY  2000   LUMP REMOVAL. STAGE 1 CANCER  . CARDIAC CATHETERIZATION    . CATARACT EXTRACTION    . COLONOSCOPY    . EYE SURGERY    . IMPLANTABLE CARDIOVERTER DEFIBRILLATOR GENERATOR CHANGE N/A 12/18/2012   Procedure: IMPLANTABLE CARDIOVERTER DEFIBRILLATOR GENERATOR CHANGE;  Surgeon: [name], MD;  Location: [facility] CATH LAB;  Service: Cardiovascular;  Laterality: N/A;  . JOINT REPLACEMENT  06/14/01   right  . LUMBAR FUSION  2006  . MASS EXCISION  11/08/2011   Procedure: EXCISION MASS;  Surgeon: [name], MD;  [facility];  Service: General;  Laterality: Left;  Excision Left Thigh Mass  . MASTECTOMY W/ SENTINEL NODE BIOPSY Left 06/04/2019   Procedure: LEFT MASTECTOMY WITH SENTINEL LYMPH NODE BIOPSY;  Surgeon: [name], MD;  Location: [place];  Service: General;  Laterality: Left;  Marland Kitchen MASTECTOMY, PARTIAL  2008   GOT PACEMAKER AND DEFIB AT THAT TIME  . PACEMAKER INSERTION  04/23/06  . TOTAL KNEE ARTHROPLASTY  05/17/01   RIGHT KNEE  . TOTAL KNEE ARTHROPLASTY Left 11/29/2014   Procedure: TOTAL LEFT KNEE ARTHROPLASTY;  Surgeon: Paralee Cancel, MD;  [facility];  Service: Orthopedics;  Laterality: Left;   Social History:   reports that she has never smoked. She has never used smokeless tobacco. She reports that she does not drink alcohol or use drugs.  Family History  Problem Relation Age of Onset  . Hypertension Mother   . Arthritis Mother   . Hypertension Father   . Hypertension Brother   . Hypertension Brother     Medications: Patient's Medications  New Prescriptions   No medications on file  Previous Medications   ACETAMINOPHEN (TYLENOL) 650 MG CR TABLET    Take 650 mg by mouth every 8 (eight) hours as needed for pain.   AMLODIPINE (NORVASC) 5 MG TABLET    Take 1 tablet (5 mg total) by mouth daily.   ASCORBIC ACID (VITAMIN C) 500 MG TABLET     Take 500 mg by mouth daily.   BUPROPION (WELLBUTRIN XL) 150 MG 24 HR TABLET    Take 3 tablets (450 mg total) by mouth daily.   CARVEDILOL (COREG) 3.125 MG TABLET    TAKE 1 TABLET BY MOUTH  TWICE DAILY   CHOLECALCIFEROL (VITAMIN D3) 50 MCG (2000 UT) TABS    Take 2,000 Units by mouth daily.    COD LIVER OIL CAPS    Take 1 capsule by mouth daily. 415 mg   FUROSEMIDE (LASIX) 20 MG TABLET    TAKE 1 TABLET BY MOUTH  DAILY   LEVOTHYROXINE (SYNTHROID, LEVOTHROID) 100 MCG TABLET    Take 1 tablet (100 mcg total) by mouth daily before breakfast.   MAGNESIUM 250 MG TABS    Take 250 mg by mouth daily.   MECLIZINE (  ANTIVERT) 12.5 MG TABLET    Take 1 tablet (12.5 mg total) by mouth 3 (three) times daily as needed for dizziness.   METHOCARBAMOL (ROBAXIN) 500 MG TABLET    Take 1 tablet (500 mg total) by mouth every 8 (eight) hours as needed for muscle spasms.   NONFORMULARY OR COMPOUNDED ITEM    Diclofenac-Baclofen 2% QHU:TMLYY 1-2 grams topically four times daily. Before application of cream rub 2 minutes to active max penetration. Quantity 120 grams   OMEPRAZOLE (PRILOSEC) 40 MG CAPSULE    TAKE 1 CAPSULE BY MOUTH  ONCE DAILY FOR STOMACH   OXCARBAZEPINE (TRILEPTAL) 300 MG TABLET    Take 300 mg by mouth daily.    POTASSIUM CHLORIDE (KLOR-CON) 10 MEQ TABLET    TAKE 1 TABLET BY MOUTH  DAILY   SERTRALINE (ZOLOFT) 100 MG TABLET    Take 200 mg by mouth daily.    SPIRONOLACTONE (ALDACTONE) 25 MG TABLET    TAKE 1 TABLET BY MOUTH IN  THE MORNING   VITAMIN E 400 UNITS TABS    Take 400 Units by mouth daily.   Modified Medications   No medications on file  Discontinued Medications   No medications on file    Physical Exam:  Vitals:   07/17/19 1538  BP: 122/80  Pulse: 74  Temp: 97.7 F (36.5 C)  TempSrc: Temporal  SpO2: 96%  Weight: 121 lb (54.9 kg)  Height: 4' 11" (1.499 m)   Body mass index is 24.44 kg/m. Wt Readings from Last 3 Encounters:  07/17/19 121 lb (54.9 kg)  06/23/19 119 lb 12.8 oz (54.3 kg)    06/16/19 121 lb (54.9 kg)    Physical Exam Constitutional:      General: She is not in acute distress.    Appearance: She is well-developed. She is not diaphoretic.  HENT:     Head: Normocephalic and atraumatic.     Mouth/Throat:     Pharynx: No oropharyngeal exudate.  Eyes:     Extraocular Movements: Extraocular movements intact.     Right eye: No nystagmus.     Left eye: No nystagmus.     Conjunctiva/sclera: Conjunctivae normal.     Pupils: Pupils are equal, round, and reactive to light.  Cardiovascular:     Rate and Rhythm: Normal rate and regular rhythm.     Heart sounds: Normal heart sounds.  Pulmonary:     Effort: Pulmonary effort is normal.     Breath sounds: Normal breath sounds.  Abdominal:     General: Bowel sounds are normal.     Palpations: Abdomen is soft.  Musculoskeletal:        General: No tenderness.     Cervical back: Normal range of motion and neck supple.  Skin:    General: Skin is warm and dry.  Neurological:     General: No focal deficit present.     Mental Status: She is alert and oriented to person, place, and time. Mental status is at baseline.     Motor: No weakness.     Coordination: Coordination normal.     Gait: Gait normal.     Deep Tendon Reflexes: Reflexes normal.     Labs reviewed: Basic Metabolic Panel: Recent Labs    11/10/18 0904 11/10/18 0904 04/15/19 1119 06/01/19 1221 06/05/19 0342  NA 143   < > 141 140 140  K 4.3   < > 4.2 4.1 3.8  CL 105   < > 105 103 105  CO2  29   < > _0 GLUCOSE 96   < > 85 85 91  BUN 27*   < > 21 24* 10  CREATININE 0.87   < > 0.84 0.92 0.89  CALCIUM 10.3   < > 10.1 10.3 8.6*  MG  --   --   --   --  2.1  PHOS  --   --   --   --  1.4*  TSH 1.88  --   --   --   --    < > = values in this interval not displayed.   Liver Function Tests: Recent Labs    11/10/18 0904 04/15/19 1119 06/01/19 1221  AST _1 ALT _2 ALKPHOS  --   --  54  BILITOT 0.3 0.4 0.5  PROT 7.0 6.7 6.9   ALBUMIN  --   --  3.7   No results for input(s): LIPASE, AMYLASE in the last 8760 hours. No results for input(s): AMMONIA in the last 8760 hours. CBC: Recent Labs    11/10/18 0904 04/15/19 1119 06/01/19 1221  WBC 7.4 7.8 7.0  NEUTROABS 4,847 5,343 4.4  HGB 12.9 13.1 13.7  HCT 39.2 40.0 43.6  MCV 85.4 84.7 90.1  PLT 119* 121* 124*   Lipid Panel: Recent Labs    11/10/18 0904  CHOL 182  HDL 60  LDLCALC 104*  TRIG 88  CHOLHDL 3.0   TSH: Recent Labs    11/10/18 0904  TSH 1.88   A1C: No results found for: HGBA1C   Assessment/Plan 1. Memory loss -progressive decline. Daughter at visit today. Pt has had a lot of stressors recently with the loss of husband and recent breast cancer diagnosis and surgery. Will also follow up blood work today - CT Head Wo Contrast; Future  2. Dizziness Ongoing, meclizine has been beneficial but dizziness has persisted. To continue meclizine, can take 25 mg TID however to use caution for fatigue and with unsteady gait can increase risk of fall. Will also stop robaxin which could be contributing  -she is already in PT and will mention the dizziness to them, we can send over order if needed  - CT Head Wo Contrast; Future - CBC with Differential/Platelet - CMP with eGFR([facility]) - TSH  3. Essential hypertension Elevated blood pressure on home readings but controlled in office, appears her home cuff is slightly off manual reading. Blood pressure controlled on current regimen so recommend to continue current medication with low sodium diet.   4. Recurrent major depressive disorder, in partial remission ([place]) Followed by psych, reports this is stable at this time.  5. Atherosclerosis of native artery of both lower extremities with intermittent claudication (H[facility]) Stable, continues on coreg could not tolerate ASA  6. Status post left mastectomy Followed by oncology due to stage 1b high grade breast caner of the left breast, s/p left mastectomy  followed by surgery  Taking robaxin daily but reports this is not benefiting, could be contributing to dizziness. To stop at this time and use tylenol as needed for pain.   7. Lymphedema of left arm Ongoing, followed by the lymphedema clinic  Next appt: 4 months, sooner if needed  Zadaya Cuadra K. [place], [facility] [phone]

## 2019-07-17 NOTE — Therapy (Signed)
[place], [place], [address] Phone: [phone]   Fax:  [phone]  Physical Therapy Treatment  Patient Details  Name: [name] MRN: [id number] Date of Birth: 07-04-1933 Referring Provider (PT): Dr. Irene Limbo  (Dr Barry Dienes is surgeon)   Encounter Date: 07/17/2019  PT End of Session - 07/17/19 1015    Visit Number  5    Number of Visits  17    Date for PT Re-Evaluation  08/31/19    PT Start Time  1010    PT Stop Time  1103    PT Time Calculation (min)  53 min    Activity Tolerance  Patient tolerated treatment well    Behavior During Therapy  [facility] for tasks assessed/performed       Past Medical History:  Diagnosis Date  . Arthritis   . Cancer [facility])    left breast cancer   . Cataracts, bilateral    removed by surgery  . CHF (congestive heart failure) ([place])    PACEMAKER & DEFIB  . Complication of anesthesia    hypotensive after back surgery in 2006  . Depression   . Dyslipidemia   . Fainted 04/21/06   AT [place]  . GERD (gastroesophageal reflux disease)   . Headache(784.0)   . Hearing loss    bilateral hearing aids  . HLD (hyperlipidemia)    diet controlled   . Hypertension   . Hypothyroidism   . ICD (implantable cardiac defibrillator) in place    pt has pacer/icd  . ICD (implantable cardiac defibrillator), biventricular, in situ   . LBBB (left bundle branch block)   . Memory loss   . Nonischemic cardiomyopathy ([place])   . Normal coronary arteries    s/p cardiac cath 2007  . Pacemaker    ICD [employer]  . Syncope   . Systolic CHF ([place])   . Vertigo   . Wears glasses     Past Surgical History:  Procedure Laterality Date  . BACK SURGERY     lumbar fusion   . BREAST LUMPECTOMY Left 2008  . BREAST SURGERY  2000   LUMP REMOVAL. STAGE 1 CANCER  . CARDIAC CATHETERIZATION    . CATARACT EXTRACTION    . COLONOSCOPY    . EYE SURGERY    . IMPLANTABLE CARDIOVERTER  DEFIBRILLATOR GENERATOR CHANGE N/A 12/18/2012   Procedure: IMPLANTABLE CARDIOVERTER DEFIBRILLATOR GENERATOR CHANGE;  Surgeon: [name], MD;  Location: [facility] CATH LAB;  Service: Cardiovascular;  Laterality: N/A;  . JOINT REPLACEMENT  06/14/01   right  . LUMBAR FUSION  2006  . MASS EXCISION  11/08/2011   Procedure: EXCISION MASS;  Surgeon: [name], MD;  [facility]: [facility];  Service: General;  Laterality: Left;  Excision Left Thigh Mass  . MASTECTOMY W/ SENTINEL NODE BIOPSY Left 06/04/2019   Procedure: LEFT MASTECTOMY WITH SENTINEL LYMPH NODE BIOPSY;  Surgeon: [name], MD;  Location: [place];  Service: General;  Laterality: Left;  Marland Kitchen MASTECTOMY, PARTIAL  2008   GOT PACEMAKER AND DEFIB AT THAT TIME  . PACEMAKER INSERTION  04/23/06  . TOTAL KNEE ARTHROPLASTY  05/17/01   RIGHT KNEE  . TOTAL KNEE ARTHROPLASTY Left 11/29/2014   Procedure: TOTAL LEFT KNEE ARTHROPLASTY;  Surgeon: Paralee Cancel, MD;  [facility];  Service: Orthopedics;  Laterality: Left;    There were no vitals filed for this visit.  Subjective Assessment - 07/17/19 1015    Subjective  Pt states that she feels  she is improving. She states that she continues with pain and currently is still experiecing vertigo.    Patient is accompained by:  Family member    Pertinent History  left breast cancer with left mastectomy on 06/04/2019 with 5 lymph nodes . past history includes lumpectomy in 2008 with radiation and no lymph nodes removed she has chronic left breast pain and seroma with excisional biopsy in 2011.  Hx includes CHR with implanted defibrillator in left chest, bilateral TKR, osteoporosis    Patient Stated Goals  be able to lift my arm better to comb my hair    Currently in Pain?  Yes    Pain Score  5     Pain Location  Axilla    Pain Orientation  Left;Lateral    Pain Descriptors / Indicators  Aching    Pain Type  Surgical pain    Pain Radiating Towards  axilla    Pain Onset  1 to 4 weeks ago    Pain Frequency  Constant                        [facility] Adult PT Treatment/Exercise - 07/17/19 0001      Manual Therapy   Manual Therapy  Myofascial release;Manual Lymphatic Drainage (MLD);Passive ROM    Myofascial Release  myofascial release to cording from incision down to the mid-antebrachium with multiple cords noted; multiple pops throughout session with significant increase in ROM into abduction    Manual Lymphatic Drainage (MLD)  In supine: short neck, swimming in the terminus, Bil shoulder collectors, bil axillary and L inguinal nodes, anterior inter-axillary anastomosis but modified to avoid pace maker, L axillo-inguinal anastomosis, lateral L brachium, medial to lateral L brachium, lateral L brachium, re-worked anastomosis (same as before), bottle neck, all surfaces of antebrachium, dorsum of the hand, re-worked all surfaces; deep abdominals    Passive ROM  To L shoulder with myofascial release into abduction and then flexion w/o myofascial release.              PT Education - 07/17/19 1100    Education Details  Pt was educated on cording and to continue easy stretching after she leaves in order to continue with gains from myofascial release such as stretching by sliding her hand up the wall in the shower.    Person(s) Educated  Patient    Methods  Explanation    Comprehension  Verbalized understanding       PT Short Term Goals - 06/30/19 1819      PT SHORT TERM GOAL #1   Title  Pt  will report pain in left shoulder is decreased to 6/10    Baseline  8/10 on 06/30/2019    Time  4    Status  New      PT SHORT TERM GOAL #2   Title  Pt wil be independent in a basic HEP for shoulder ROM    Time  4    Period  Weeks    Status  New        PT Long Term Goals - 06/30/19 1820      PT LONG TERM GOAL #1   Title  Pt will have reduction in left forearm at 15 cm proximla to ulnar styloid by 1 cm    Time  8    Period  Weeks    Status  New      PT LONG TERM GOAL #2   Title  Pt and  daugher will report they understand lymphedema risk reduction practices and how to manage left arm lymphedema at home    Time  8    Period  Weeks    Status  New      PT LONG TERM GOAL #3   Title  Pt will have 140 degrees of painfree left shoulder abduction so that she can easily perfom her ADLs at home    Baseline  115 and painful on 06/30/2019    Time  8    Period  Weeks    Status  New      PT LONG TERM GOAL #4   Title  Patient will be Independent with HEP for shoulder ROM and strength    Time  8    Period  Weeks    Status  New            Plan - 07/17/19 1015    Clinical Impression Statement  Pt continues with vertigo requiring HHA to maintain balance when getting from sitting to standing in order to dress after session. Her vertigo dcreased by the time she was dressed. Most time was spent on myofascial release today making sure to preserve incision and to keep any tension off of the incision site. Cording was noted from the axilla down into the distal antebrachium; multiple pops noted during myofascial release and pt had significant improvement in L shoulder ROM. MLD was performed following myofascial release in order to decrease risk for increased swelling and to faclitate fluid flow out of the LUE. Pt demonstrates some swelling in her L upper quadrant in the anterior/lateral chest and may benefit from compression in this area. Pt will benefit from continued POC at this time.    Personal Factors and Comorbidities  Age;Comorbidity 3+    Comorbidities  previous triple negataive left breast cancer, seroma after lumpectomy, CHF with defibrillator, Bilateral TKR osteoporosis    Examination-Activity Limitations  Reach Overhead;Bathing    Rehab Potential  Good    PT Frequency  2x / week    PT Duration  8 weeks    PT Treatment/Interventions  ADLs/Self Care Home Management;DME Instruction;Therapeutic activities;Patient/family education;Orthotic Fit/Training;Manual techniques;Manual lymph  drainage;Compression bandaging;Scar mobilization;Taping;Passive range of motion    PT Next Visit Plan  cont MLD to left arm and gentle ROM being mindfull of defibrillator, progress exercise as pain decreases; manual techniques as needed for cording in left axilla    PT Home Exercise Plan  Supine dowel exercises    Consulted and Agree with Plan of Care  Patient       Patient will benefit from skilled therapeutic intervention in order to improve the following deficits and impairments:  Decreased activity tolerance, Decreased knowledge of precautions, Decreased knowledge of use of DME, Decreased range of motion, Decreased strength, Increased fascial restricitons, Increased edema, Impaired perceived functional ability, Increased muscle spasms, Impaired UE functional use, Postural dysfunction, Pain  Visit Diagnosis: Aftercare following surgery for neoplasm  Acute pain of left shoulder  Postmastectomy lymphedema  Stiffness of left shoulder joint     Problem List Patient Active Problem List   Diagnosis Date Noted  . Status post left mastectomy 07/01/2019  . Breast cancer of lower-outer quadrant of left female breast ([place]) 06/04/2019  . Candida infection, oral 01/15/2019  . Senile purpura ([place]) 04/14/2018  . Lumbar post-laminectomy syndrome 12/03/2017  . Lumbar spondylosis 12/03/2017  . History of back surgery 09/01/2017  . Constipation 09/01/2017  . Hypothyroidism due to acquired atrophy  of thyroid 09/01/2017  . Mixed hyperlipidemia 09/01/2017  . Age-related osteoporosis without current pathological fracture 09/01/2017  . High risk medication use 09/01/2017  . Arthritis of hand 05/17/2017  . Atherosclerosis of native arteries of extremity with intermittent claudication ([place]) 05/15/2017  . Status post total bilateral knee replacement 12/20/2016  . Gait abnormality 07/16/2016  . Chronic low back pain 07/16/2016  . Mild cognitive impairment 07/16/2016  . Wrist pain 05/10/2015  . S/P  left TKA 11/29/2014  . S/P knee replacement 11/29/2014  . Spontaneous bruising 08/27/2014  . Numbness and tingling in right hand 07/28/2014  . Essential tremor 07/28/2014  . Dizziness 05/28/2014  . Shaky 05/28/2014  . Memory loss 05/28/2014  . Depression 05/06/2014  . Lipoma of left upper thigh 3x5 cm 09/28/2011  . Cerebral vascular accident ([place]) 08/09/2011  . Syncope 08/09/2011  . History of breast cancer T1bNxMx, s/p BCT 2008, triple negative 01/26/2011  . Chronic L breast pain with chronic recurrent seroma, s/p excisional biopsy 01/12/2010 01/26/2011  . Fainted   . ICD (implantable cardioverter-defibrillator), biventricular, in situ 08/02/2010  . Chronic systolic heart failure ([place]) 08/02/2010  . Hypertension 08/02/2010    Ander Purpura, PT 07/17/2019, 11:05 AM  [place], [place], [address] Phone: [phone]   Fax:  [phone]  Name: [name] MRN: [id number] Date of Birth: September 10, 1933

## 2019-07-18 LAB — CBC WITH DIFFERENTIAL/PLATELET
Absolute Monocytes: 851 cells/uL (ref 200–950)
Basophils Absolute: 53 cells/uL (ref 0–200)
Basophils Relative: 0.8 %
Eosinophils Absolute: 238 cells/uL (ref 15–500)
Eosinophils Relative: 3.6 %
HCT: 37.9 % (ref 35.0–45.0)
Hemoglobin: 12.5 g/dL (ref 11.7–15.5)
Lymphs Abs: 1795 cells/uL (ref 850–3900)
MCH: 28.9 pg (ref 27.0–33.0)
MCHC: 33 g/dL (ref 32.0–36.0)
MCV: 87.7 fL (ref 80.0–100.0)
MPV: 11.5 fL (ref 7.5–12.5)
Monocytes Relative: 12.9 %
Neutro Abs: 3663 cells/uL (ref 1500–7800)
Neutrophils Relative %: 55.5 %
Platelets: 141 10*3/uL (ref 140–400)
RBC: 4.32 10*6/uL (ref 3.80–5.10)
RDW: 14.4 % (ref 11.0–15.0)
Total Lymphocyte: 27.2 %
WBC: 6.6 10*3/uL (ref 3.8–10.8)

## 2019-07-18 LAB — COMPLETE METABOLIC PANEL WITH GFR
AG Ratio: 1.5 (calc) (ref 1.0–2.5)
ALT: 17 U/L (ref 6–29)
AST: 20 U/L (ref 10–35)
Albumin: 4 g/dL (ref 3.6–5.1)
Alkaline phosphatase (APISO): 55 U/L (ref 37–153)
BUN/Creatinine Ratio: 37 (calc) — ABNORMAL HIGH (ref 6–22)
BUN: 31 mg/dL — ABNORMAL HIGH (ref 7–25)
CO2: 27 mmol/L (ref 20–32)
Calcium: 10.2 mg/dL (ref 8.6–10.4)
Chloride: 106 mmol/L (ref 98–110)
Creat: 0.83 mg/dL (ref 0.60–0.88)
GFR, Est African American: 76 mL/min/{1.73_m2} (ref >=60)
GFR, Est Non African American: 65 mL/min/{1.73_m2} (ref >=60)
Globulin: 2.6 g/dL (calc) (ref 1.9–3.7)
Glucose, Bld: 100 mg/dL (ref 65–139)
Potassium: 4.5 mmol/L (ref 3.5–5.3)
Sodium: 140 mmol/L (ref 135–146)
Total Bilirubin: 0.2 mg/dL (ref 0.2–1.2)
Total Protein: 6.6 g/dL (ref 6.1–8.1)

## 2019-07-18 LAB — TSH: TSH: 1.52 mIU/L (ref 0.40–4.50)

## 2019-07-20 ENCOUNTER — Other Ambulatory Visit: Payer: Self-pay

## 2019-07-20 ENCOUNTER — Ambulatory Visit: Payer: Medicare Other

## 2019-07-20 DIAGNOSIS — Z483 Aftercare following surgery for neoplasm: Secondary | ICD-10-CM

## 2019-07-20 DIAGNOSIS — I972 Postmastectomy lymphedema syndrome: Secondary | ICD-10-CM

## 2019-07-20 DIAGNOSIS — M25612 Stiffness of left shoulder, not elsewhere classified: Secondary | ICD-10-CM

## 2019-07-20 DIAGNOSIS — M25512 Pain in left shoulder: Secondary | ICD-10-CM

## 2019-07-20 NOTE — Therapy (Signed)
[place], [place], [address] Phone: [phone]   Fax:  [phone]  Physical Therapy Treatment  Patient Details  Name: [name] MRN: [id number] Date of Birth: [birth year] Referring Provider (PT): Dr. Irene Limbo  (Dr Barry Dienes is surgeon)   Encounter Date: 07/20/2019  PT End of Session - 07/20/19 1444    Visit Number  6    Number of Visits  17    Date for PT Re-Evaluation  08/31/19    PT Start Time  1409    PT Stop Time  1505    PT Time Calculation (min)  56 min    Activity Tolerance  Patient tolerated treatment well    Behavior During Therapy  [facility] for tasks assessed/performed       Past Medical History:  Diagnosis Date  . Arthritis   . Cancer [facility])    left breast cancer   . Cataracts, bilateral    removed by surgery  . CHF (congestive heart failure) ([place])    PACEMAKER & DEFIB  . Complication of anesthesia    hypotensive after back surgery in 2006  . Depression   . Dyslipidemia   . Fainted 04/21/06   AT [place]  . GERD (gastroesophageal reflux disease)   . Headache(784.0)   . Hearing loss    bilateral hearing aids  . HLD (hyperlipidemia)    diet controlled   . Hypertension   . Hypothyroidism   . ICD (implantable cardiac defibrillator) in place    pt has pacer/icd  . ICD (implantable cardiac defibrillator), biventricular, in situ   . LBBB (left bundle branch block)   . Memory loss   . Nonischemic cardiomyopathy ([place])   . Normal coronary arteries    s/p cardiac cath 2007  . Pacemaker    ICD [employer]  . Syncope   . Systolic CHF ([place])   . Vertigo   . Wears glasses     Past Surgical History:  Procedure Laterality Date  . BACK SURGERY     lumbar fusion   . BREAST LUMPECTOMY Left 2008  . BREAST SURGERY  2000   LUMP REMOVAL. STAGE 1 CANCER  . CARDIAC CATHETERIZATION    . CATARACT EXTRACTION    . COLONOSCOPY    . EYE SURGERY    . IMPLANTABLE CARDIOVERTER  DEFIBRILLATOR GENERATOR CHANGE N/A 12/18/2012   Procedure: IMPLANTABLE CARDIOVERTER DEFIBRILLATOR GENERATOR CHANGE;  Surgeon: [name], MD;  Location[facility] LAB;  Service: Cardiovascular;  Laterality: N/A;  . JOINT REPLACEMENT  06/14/01   right  . LUMBAR FUSION  2006  . MASS EXCISION  11/08/2011   Procedure: EXCISION MASS;  Surgeon: [name], MD;  [facility];  Service: General;  Laterality: Left;  Excision Left Thigh Mass  . MASTECTOMY W/ SENTINEL NODE BIOPSY Left 06/04/2019   Procedure: LEFT MASTECTOMY WITH SENTINEL LYMPH NODE BIOPSY;  Surgeon: [name], MD;  Location: [place];  Service: General;  Laterality: Left;  Marland Kitchen MASTECTOMY, PARTIAL  2008   GOT PACEMAKER AND DEFIB AT THAT TIME  . PACEMAKER INSERTION  04/23/06  . TOTAL KNEE ARTHROPLASTY  05/17/01   RIGHT KNEE  . TOTAL KNEE ARTHROPLASTY Left 11/29/2014   Procedure: TOTAL LEFT KNEE ARTHROPLASTY;  Surgeon: Paralee Cancel, MD;  [facility];  Service: Orthopedics;  Laterality: Left;    There were no vitals filed for this visit.  Subjective Assessment - 07/20/19 1413    Subjective  As we've been working on  my Lt shoulder and axilla I can tell it's a little more loose. I saw my doctor again last week for a check up about my persistent vertigo and she scheduled me for a CT scan in 2 weeks (08/05/19). I can reach high enough now to brush the back of my hair.    Pertinent History  left breast cancer with left mastectomy on 06/04/2019 with 5 lymph nodes . past history includes lumpectomy in 2008 with radiation and no lymph nodes removed she has chronic left breast pain and seroma with excisional biopsy in 2011.  Hx includes CHR with implanted defibrillator in left chest, bilateral TKR, osteoporosis    Patient Stated Goals  be able to lift my arm better to comb my hair    Currently in Pain?  No/denies         [facility] PT Assessment - 07/20/19 0001      AROM   Left Shoulder Flexion  136 Degrees    Left Shoulder ABduction  122  Degrees    Left Shoulder External Rotation  90 Degrees                   [facility] Adult PT Treatment/Exercise - 07/20/19 0001      Shoulder Exercises: Supine   Horizontal ABduction  Strengthening;Both;10 reps;Theraband    Theraband Level (Shoulder Horizontal ABduction)  Level 1 (Yellow)    External Rotation  Strengthening;Both;10 reps;Theraband    Theraband Level (Shoulder External Rotation)  Level 1 (Yellow)    Flexion  Strengthening;Both;10 reps;Theraband   Narrow and Wide grip, 10 times each   Theraband Level (Shoulder Flexion)  Level 1 (Yellow)    Diagonals  Strengthening;Right;Left;10 reps    Theraband Level (Shoulder Diagonals)  Level 1 (Yellow)    Diagonals Limitations  Pt returning therapist demo for all above supine exs      Shoulder Exercises: Pulleys   Flexion  2 minutes    Flexion Limitations  VCs for slower pace and to relax shoulders    ABduction  2 minutes    ABduction Limitations  Pt with good technique      Shoulder Exercises: Therapy Ball   Flexion  Both;5 reps   forward lean into end of stretch   Flexion Limitations  SBA for vertigo as this is coming and going for pt daily      Manual Therapy   Manual Therapy  Myofascial release;Manual Lymphatic Drainage (MLD);Passive ROM    Myofascial Release  myofascial release to cording from incision down to the mid-antebrachium with multiple cords noted    Manual Lymphatic Drainage (MLD)  In supine: short neck, bil shoulder collectors, 5 diaphragmatic breaths, Rt axillary and L inguinal nodes, anterior inter-axillary anastomosis but modified to avoid pace maker, L axillo-inguinal anastomosis, lateral L brachium, medial to lateral L brachium, lateral L brachium, re-worked anastomosis (same as before), bottle neck, all surfaces of antebrachium, dorsum of the hand, re-worked all surfaces    Passive ROM  To Lt shoulder with myofascial release into abduction and flexion, then D2 as well             PT Education -  07/20/19 1440    Education Details  Supine scapular series with yellow theraband    Person(s) Educated  Patient    Methods  Explanation;Demonstration;Handout    Comprehension  Verbalized understanding;Returned demonstration;Need further instruction       PT Short Term Goals - 06/30/19 1819      PT SHORT TERM GOAL #1  Title  Pt  will report pain in left shoulder is decreased to 6/10    Baseline  8/10 on 06/30/2019    Time  4    Status  New      PT SHORT TERM GOAL #2   Title  Pt wil be independent in a basic HEP for shoulder ROM    Time  4    Period  Weeks    Status  New        PT Long Term Goals - 06/30/19 1820      PT LONG TERM GOAL #1   Title  Pt will have reduction in left forearm at 15 cm proximla to ulnar styloid by 1 cm    Time  8    Period  Weeks    Status  New      PT LONG TERM GOAL #2   Title  Pt and daugher will report they understand lymphedema risk reduction practices and how to manage left arm lymphedema at home    Time  8    Period  Weeks    Status  New      PT LONG TERM GOAL #3   Title  Pt will have 140 degrees of painfree left shoulder abduction so that she can easily perfom her ADLs at home    Baseline  115 and painful on 06/30/2019    Time  8    Period  Weeks    Status  New      PT LONG TERM GOAL #4   Title  Patient will be Independent with HEP for shoulder ROM and strength    Time  8    Period  Weeks    Status  New            Plan - 07/20/19 1444    Clinical Impression Statement  Pts A/ROM progressing very well and she reports able to reach back of head when brushing hair now. Progressed Ms. Sharpe to supine scapular series which she tolerated well with cuing for correct UE positioning. Also continued with manual therapy working to improve her Lt shoulder ROM and decrese myofascial tightness.    Personal Factors and Comorbidities  Age;Comorbidity 3+    Comorbidities  previous triple negataive left breast cancer, seroma after lumpectomy,  CHF with defibrillator, Bilateral TKR osteoporosis    Examination-Activity Limitations  Reach Overhead;Bathing    Stability/Clinical Decision Making  Stable/Uncomplicated    Rehab Potential  Good    PT Frequency  2x / week    PT Duration  8 weeks    PT Treatment/Interventions  ADLs/Self Care Home Management;DME Instruction;Therapeutic activities;Patient/family education;Orthotic Fit/Training;Manual techniques;Manual lymph drainage;Compression bandaging;Scar mobilization;Taping;Passive range of motion    PT Next Visit Plan  cont MLD to left arm and gentle ROM being mindfull of defibrillator, progress exercise as pain decreases; manual techniques as needed for cording in left axilla    PT Home Exercise Plan  Supine dowel exercises; supine scapular series    Consulted and Agree with Plan of Care  Patient       Patient will benefit from skilled therapeutic intervention in order to improve the following deficits and impairments:  Decreased activity tolerance, Decreased knowledge of precautions, Decreased knowledge of use of DME, Decreased range of motion, Decreased strength, Increased fascial restricitons, Increased edema, Impaired perceived functional ability, Increased muscle spasms, Impaired UE functional use, Postural dysfunction, Pain  Visit Diagnosis: Aftercare following surgery for neoplasm  Acute pain of left shoulder  Postmastectomy  lymphedema  Stiffness of left shoulder joint     Problem List Patient Active Problem List   Diagnosis Date Noted  . Recurrent major depressive disorder, in partial remission ([place]) 07/17/2019  . Status post left mastectomy 07/01/2019  . Breast cancer of lower-outer quadrant of left female breast ([place]) 06/04/2019  . Candida infection, oral 01/15/2019  . Senile purpura ([place]) 04/14/2018  . Lumbar post-laminectomy syndrome 12/03/2017  . Lumbar spondylosis 12/03/2017  . History of back surgery 09/01/2017  . Constipation 09/01/2017  . Hypothyroidism  due to acquired atrophy of thyroid 09/01/2017  . Mixed hyperlipidemia 09/01/2017  . Age-related osteoporosis without current pathological fracture 09/01/2017  . High risk medication use 09/01/2017  . Arthritis of hand 05/17/2017  . Atherosclerosis of native arteries of extremity with intermittent claudication ([place]) 05/15/2017  . Status post total bilateral knee replacement 12/20/2016  . Gait abnormality 07/16/2016  . Chronic low back pain 07/16/2016  . Mild cognitive impairment 07/16/2016  . Wrist pain 05/10/2015  . S/P left TKA 11/29/2014  . S/P knee replacement 11/29/2014  . Spontaneous bruising 08/27/2014  . Numbness and tingling in right hand 07/28/2014  . Essential tremor 07/28/2014  . Dizziness 05/28/2014  . Shaky 05/28/2014  . Memory loss 05/28/2014  . Depression 05/06/2014  . Lipoma of left upper thigh 3x5 cm 09/28/2011  . Cerebral vascular accident ([place]) 08/09/2011  . Syncope 08/09/2011  . History of breast cancer T1bNxMx, s/p BCT 2008, triple negative 01/26/2011  . Chronic L breast pain with chronic recurrent seroma, s/p excisional biopsy 01/12/2010 01/26/2011  . Fainted   . ICD (implantable cardioverter-defibrillator), biventricular, in situ 08/02/2010  . Chronic systolic heart failure ([place]) 08/02/2010  . Hypertension 08/02/2010    Otelia Limes, PTA 07/20/2019, 3:11 PM  [place], [place], [address] Phone: [phone]   Fax:  [phone]  Name: [name] MRN: [id number] Date of Birth: 05-04-1933

## 2019-07-20 NOTE — Patient Instructions (Signed)

## 2019-07-22 ENCOUNTER — Other Ambulatory Visit: Payer: Self-pay

## 2019-07-22 ENCOUNTER — Ambulatory Visit: Payer: Medicare Other | Admitting: Physical Therapy

## 2019-07-22 DIAGNOSIS — Z483 Aftercare following surgery for neoplasm: Secondary | ICD-10-CM | POA: Diagnosis not present

## 2019-07-22 DIAGNOSIS — M25512 Pain in left shoulder: Secondary | ICD-10-CM

## 2019-07-22 DIAGNOSIS — I972 Postmastectomy lymphedema syndrome: Secondary | ICD-10-CM

## 2019-07-22 DIAGNOSIS — M25612 Stiffness of left shoulder, not elsewhere classified: Secondary | ICD-10-CM

## 2019-07-22 NOTE — Therapy (Signed)
[place], [place], [address] Phone: [phone]   Fax:  [phone]  Physical Therapy Treatment  Patient Details  Name: [name] MRN: [id number] Date of Birth: [birth year] Referring Provider (PT): Dr. Irene Limbo  (Dr Barry Dienes is surgeon)   Encounter Date: 07/22/2019  PT End of Session - 07/22/19 1629    Visit Number  7    Number of Visits  17    Date for PT Re-Evaluation  08/31/19    PT Start Time  1310    PT Stop Time  1400    PT Time Calculation (min)  50 min    Activity Tolerance  Patient tolerated treatment well    Behavior During Therapy  [facility] for tasks assessed/performed       Past Medical History:  Diagnosis Date  . Arthritis   . Cancer [facility])    left breast cancer   . Cataracts, bilateral    removed by surgery  . CHF (congestive heart failure) ([place])    PACEMAKER & DEFIB  . Complication of anesthesia    hypotensive after back surgery in 2006  . Depression   . Dyslipidemia   . Fainted 04/21/06   AT [place]  . GERD (gastroesophageal reflux disease)   . Headache(784.0)   . Hearing loss    bilateral hearing aids  . HLD (hyperlipidemia)    diet controlled   . Hypertension   . Hypothyroidism   . ICD (implantable cardiac defibrillator) in place    pt has pacer/icd  . ICD (implantable cardiac defibrillator), biventricular, in situ   . LBBB (left bundle branch block)   . Memory loss   . Nonischemic cardiomyopathy ([place])   . Normal coronary arteries    s/p cardiac cath 2007  . Pacemaker    ICD [employer]  . Syncope   . Systolic CHF ([place])   . Vertigo   . Wears glasses     Past Surgical History:  Procedure Laterality Date  . BACK SURGERY     lumbar fusion   . BREAST LUMPECTOMY Left 2008  . BREAST SURGERY  2000   LUMP REMOVAL. STAGE 1 CANCER  . CARDIAC CATHETERIZATION    . CATARACT EXTRACTION    . COLONOSCOPY    . EYE SURGERY    . IMPLANTABLE CARDIOVERTER  DEFIBRILLATOR GENERATOR CHANGE N/A 12/18/2012   Procedure: IMPLANTABLE CARDIOVERTER DEFIBRILLATOR GENERATOR CHANGE;  Surgeon: [name], MD;  Location: [facility] CATH LAB;  Service: Cardiovascular;  Laterality: N/A;  . JOINT REPLACEMENT  06/14/01   right  . LUMBAR FUSION  2006  . MASS EXCISION  11/08/2011   Procedure: EXCISION MASS;  Surgeon: [name], MD;  Location: [facility] OR[facility];  Service: General;  Laterality: Left;  Excision Left Thigh Mass  . MASTECTOMY W/ SENTINEL NODE BIOPSY Left 06/04/2019   Procedure: LEFT MASTECTOMY WITH SENTINEL LYMPH NODE BIOPSY;  Surgeon: [name], MD;  Location: [facility];  Service: General;  Laterality: Left;  Marland Kitchen MASTECTOMY, PARTIAL  2008   GOT PACEMAKER AND DEFIB AT THAT TIME  . PACEMAKER INSERTION  04/23/06  . TOTAL KNEE ARTHROPLASTY  05/17/01   RIGHT KNEE  . TOTAL KNEE ARTHROPLASTY Left 11/29/2014   Procedure: TOTAL LEFT KNEE ARTHROPLASTY;  Surgeon: Paralee Cancel, MD;  Location: [facility];  Service: Orthopedics;  Laterality: Left;    There were no vitals filed for this visit.  Subjective Assessment - 07/22/19 1310    Subjective  Pt says she is doing  well, Her vertigo is on and off.  She sitll has some swelling in her left arm and when she rests her forearm on a table she shad pain    Patient Stated Goals  be able to lift my arm better to comb my hair    Currently in Pain?  Yes    Pain Score  6     Pain Location  Axilla    Pain Orientation  Left    Pain Descriptors / Indicators  Aching    Pain Type  Surgical pain            LYMPHEDEMA/ONCOLOGY QUESTIONNAIRE - 07/22/19 1312      Left Upper Extremity Lymphedema   10 cm Proximal to Olecranon Process  27.5 cm    Olecranon Process  23.5 cm    15 cm Proximal to Ulnar Styloid Process  23 cm    10 cm Proximal to Ulnar Styloid Process  20 cm    Just Proximal to Ulnar Styloid Process  16 cm    Across Hand at [employer]  17.5 cm    At [place] of 2nd Digit  5.9 cm                [facility] Adult PT  Treatment/Exercise - 07/22/19 0001      Exercises   Exercises  Shoulder      Shoulder Exercises: Sidelying   External Rotation  AROM;Left;10 reps    Flexion  AROM;AAROM;10 reps    Flexion Limitations  needs min assist at hand to keep arm in horizontal plane     ABduction  AROM;Left;10 reps    Other Sidelying Exercises  small circles with hand pointed to ceiling.       Manual Therapy   Manual Therapy  Edema management;Myofascial release;Manual Lymphatic Drainage (MLD);Passive ROM    Manual therapy comments  remeasured arm     Edema Management   talked about compression with pt and she signed a for from [facility] to be measured for a compression glove and velcro garment     Myofascial Release  to tight scar tissue at left lateral chest     Manual Lymphatic Drainage (MLD)  In supine: short neck, bil shoulder collectors, 5 diaphragmatic breaths, Rt axillary and L inguinal nodes, anterior inter-axillary anastomosis but modified to avoid pace maker, L axillo-inguinal anastomosis, lateral L brachium, medial to lateral L brachium, lateral L brachium, re-worked anastomosis (same as before), bottle neck, all surfaces of antebrachium, dorsum of the hand, re-worked all surfaces    Passive ROM  to left shoulder              PT Education - 07/22/19 1628    Education Details  about the need for compression in left arm, she is not sure she wants to be bandaged but agrees to compression garments to be paid for by [employer]) Educated  Patient    Methods  Explanation    Comprehension  Verbalized understanding       PT Short Term Goals - 06/30/19 1819      PT SHORT TERM GOAL #1   Title  Pt  will report pain in left shoulder is decreased to 6/10    Baseline  8/10 on 06/30/2019    Time  4    Status  New      PT SHORT TERM GOAL #2   Title  Pt wil be independent in a basic HEP for shoulder ROM  Time  4    Period  Weeks    Status  New        PT Long Term Goals - 06/30/19 1820       PT LONG TERM GOAL #1   Title  Pt will have reduction in left forearm at 15 cm proximla to ulnar styloid by 1 cm    Time  8    Period  Weeks    Status  New      PT LONG TERM GOAL #2   Title  Pt and daugher will report they understand lymphedema risk reduction practices and how to manage left arm lymphedema at home    Time  8    Period  Weeks    Status  New      PT LONG TERM GOAL #3   Title  Pt will have 140 degrees of painfree left shoulder abduction so that she can easily perfom her ADLs at home    Baseline  115 and painful on 06/30/2019    Time  8    Period  Weeks    Status  New      PT LONG TERM GOAL #4   Title  Patient will be Independent with HEP for shoulder ROM and strength    Time  8    Period  Weeks    Status  New            Plan - 07/22/19 1629    Clinical Impression Statement  Remeasured arm and hand and pt is improved but she still has lymphedema in her hand and arm. Do not feel she will be successful with donning a compression sleeve, but she might be able to do a compression glove and a velcro arm piece. Alight for signed and demographics sent to [facility]. Focused on myofascial release to tight tissue at lateral chest and had best success with this and exercise with patient in sidedlying. Pt felt better at end of session    Personal Factors and Comorbidities  Age;Comorbidity 3+    Comorbidities  previous triple negataive left breast cancer, seroma after lumpectomy, CHF with defibrillator, Bilateral TKR osteoporosis    Rehab Potential  Good    PT Frequency  2x / week    PT Duration  8 weeks    PT Next Visit Plan  focus on scar at lateral chest, cont MLD to left arm and gentle ROM being mindfull of defibrillator, progress exercise as pain decreases; manual techniques as needed for cording in left axilla    PT Home Exercise Plan  Supine dowel exercises; supine scapular series    Consulted and Agree with Plan of Care  Patient       Patient will benefit from  skilled therapeutic intervention in order to improve the following deficits and impairments:  Decreased activity tolerance, Decreased knowledge of precautions, Decreased knowledge of use of DME, Decreased range of motion, Decreased strength, Increased fascial restricitons, Increased edema, Impaired perceived functional ability, Increased muscle spasms, Impaired UE functional use, Postural dysfunction, Pain  Visit Diagnosis: Aftercare following surgery for neoplasm  Acute pain of left shoulder  Postmastectomy lymphedema  Stiffness of left shoulder joint     Problem List Patient Active Problem List   Diagnosis Date Noted  . Recurrent major depressive disorder, in partial remission ([place]) 07/17/2019  . Status post left mastectomy 07/01/2019  . Breast cancer of lower-outer quadrant of left female breast ([place]) 06/04/2019  . Candida infection, oral 01/15/2019  . Senile  purpura ([place]) 04/14/2018  . Lumbar post-laminectomy syndrome 12/03/2017  . Lumbar spondylosis 12/03/2017  . History of back surgery 09/01/2017  . Constipation 09/01/2017  . Hypothyroidism due to acquired atrophy of thyroid 09/01/2017  . Mixed hyperlipidemia 09/01/2017  . Age-related osteoporosis without current pathological fracture 09/01/2017  . High risk medication use 09/01/2017  . Arthritis of hand 05/17/2017  . Atherosclerosis of native arteries of extremity with intermittent claudication ([place]) 05/15/2017  . Status post total bilateral knee replacement 12/20/2016  . Gait abnormality 07/16/2016  . Chronic low back pain 07/16/2016  . Mild cognitive impairment 07/16/2016  . Wrist pain 05/10/2015  . S/P left TKA 11/29/2014  . S/P knee replacement 11/29/2014  . Spontaneous bruising 08/27/2014  . Numbness and tingling in right hand 07/28/2014  . Essential tremor 07/28/2014  . Dizziness 05/28/2014  . Shaky 05/28/2014  . Memory loss 05/28/2014  . Depression 05/06/2014  . Lipoma of left upper thigh 3x5 cm 09/28/2011   . Cerebral vascular accident ([place]) 08/09/2011  . Syncope 08/09/2011  . History of breast cancer [id number], s/p BCT 2008, triple negative 01/26/2011  . Chronic L breast pain with chronic recurrent seroma, s/p excisional biopsy 01/12/2010 01/26/2011  . Fainted   . ICD (implantable cardioverter-defibrillator), biventricular, in situ 08/02/2010  . Chronic systolic heart failure ([place]) 08/02/2010  . Hypertension 08/02/2010   Donato Heinz. Owens Shark PT  Norwood Levo 07/22/2019, 4:33 PM  [place], [place], [address] Phone: [phone]   Fax:  [phone]  Name: Suman Mccourt MRN: [id number] Date of Birth: 1933/07/01

## 2019-07-23 ENCOUNTER — Ambulatory Visit (INDEPENDENT_AMBULATORY_CARE_PROVIDER_SITE_OTHER): Payer: Medicare Other | Admitting: Neurology

## 2019-07-23 ENCOUNTER — Encounter: Payer: Self-pay | Admitting: Neurology

## 2019-07-23 VITALS — BP 136/84 | HR 70 | Temp 98.4°F | Wt 121.0 lb

## 2019-07-23 DIAGNOSIS — R42 Dizziness and giddiness: Secondary | ICD-10-CM | POA: Diagnosis not present

## 2019-07-23 DIAGNOSIS — G3184 Mild cognitive impairment, so stated: Secondary | ICD-10-CM

## 2019-07-23 MED ORDER — MECLIZINE HCL 12.5 MG PO TABS: 12.5000 mg | ORAL_TABLET | Freq: Three times a day (TID) | ORAL | 1 refills | Status: DC | PRN

## 2019-07-23 NOTE — Patient Instructions (Addendum)
I will refill the meclizine, good idea to get the CT head scan  Continue therapy, memory score was 27/30 today See you back in 6 months

## 2019-07-23 NOTE — Progress Notes (Signed)
PATIENT: [name] DOB: March 10, 1934  REASON FOR VISIT: follow up HISTORY FROM: patient  HISTORY OF PRESENT ILLNESS: Today 07/23/19  HISTORY  HISTORY OF PRESENT ILLNESS:HISTORY [name] is a 86 yo RH AAF referred by her primary care Dr. Baird Cancer for evaluation of memory trouble, drive herself to office alone at today's clinical visit.  She is a retired Writer in 2003, she retired at age 24 because of her right knee pain, she had right knee replacement, in 2004, she also had lumbar decompression surgery by Dr. Nicholes Calamity under general anesthesia, woke up from surgery, she noticed mild memory trouble, she has short-term memory trouble, has been persistent since then, she denies difficulty talking, no strokelike symptoms then. She lives at home with her family, highly functional, driving, independent at daily activity, able to keep her check in balance, She suffered long-standing history of bipolar disorder, on polypharmacy treatment, this including Trileptal 300 mg daily, Zoloft 100 mg a day, Wellbutrin 150 mg 3 tablets a day, She had accident of sudden onset dizziness couple days ago, after dinner, she felt lightheaded, has to crawling upstairs, was helped by her husband to get up, then she fell to the ground, whole-body shaking, no loss of consciousness. She has baseline mild gait difficulty due to her low back pain, bilateral knee pain, She denied a family history of dementia, CT head in 2013, Unchanged mild atrophy and microvascular ischemic disease without acute intracranial process. She had a history of chronic systolic heart failure, left bundle branch block, status post biventricular ICD insertion in 2009, underwent device generator change out Sept 2014. S She presents for one year evaluation. She reports feeling dizzy Tuesday night with improvement by Wednesday morning. She also reports upper body "shaking". The only pain she has is from  arthritis. She gets some mild edema in her right ankle. She reports her blood pressure at home usually runs 110-120/60-70. Sometimes when she gets up quickly, she felt lightheaded.  UPDATE April 8th 2016: She is overall doing very well, only has occasionally dizziness, especially when she gets up quickly, today's Mini-Mental status examination is 29 out of 30 CAT scan of the brain showed mild small vessel disease, no acute lesions, EEG showed mild slowing  UPDATE July 16 2016:YY She drive here herself, she continue complains of worsening memory loss, she lives with her husband in their house of more than 58 years old, she is independent in her daily activity, exercise regularly, Following senioremexercise program on TV, she has mild low back pain, cause mild gait difficulty sometimes, radiating pain to bilateral lower extremity, she has no bowel and bladder incontinence.  Update 07/31/2018 YY: She complains of worseninggait abnormalities and memory loss,she lives with her husband, she still drives, stumble a lot,  Previously, she has tried aricept and namenda, could not tolerate the side effect.I have reviewed and agreed above plan.  Update January 21, 2019 SS: She is here today alone.  Her husband suffered a stroke a few days ago, is in the hospital.  She is living alone for the time being, he will have to go to rehab.  She continues to drive a car.  She denies any trouble getting lost or with directions.  She is able to perform her own ADLs and manage her medications.  She indicates she does have some gait instability due to back pain.  She has not had any falls.  Since last seen, she indicates some memory decline, mostly trouble  concentrating.  She has 1 son who lives in [place].  She reports she has neighbors and friends who are nearby to offer support.  She says her overall health has been good.  She does have underlying bipolar disorder, is managed by her psychiatrist, Dr Reece Levy.  She  reports she does a daily senior TV exercise program.  She has been unable to tolerate Aricept, Exelon, or Namenda due to side effect.   Update July 23, 2019 SS: Here with her daughter. A lot of changes since last seen, her husband passed away, she was diagnosed with breast cancer, had a left mastectomy in February.  Her daughter from [place], will be relocating here. She has noticed decline in memory, more forgetful, misplacing things. No longer driving, mostly due to the lymphedema in her left arm. Has complained of dizziness since her mastectomy, feels her walking is staggery, 2 falls last few months. Has improved over time, taking meclizine as needed (needs refill), feels the room is spinning worse when she moves her head, notices various times, but often lying down in the bed. Is in PT currently, possibly vestibular rehab. CT head scan is scheduled 4/28, has seen PCP.   REVIEW OF SYSTEMS: Out of a complete 14 system review of symptoms, the patient complains only of the following symptoms, and all other reviewed systems are negative.  Memory loss, dizziness  ALLERGIES: Allergies  Allergen Reactions  . Iodine Shortness Of Breath    Iodine contrast, CHF , SOB  . Shellfish Allergy Shortness Of Breath  . Memantine     Malaise, fogginess/ couldn't think  . Aspirin Nausea And Vomiting  . Codeine Nausea And Vomiting    HOME MEDICATIONS: Outpatient Medications Prior to Visit  Medication Sig Dispense Refill  . acetaminophen (TYLENOL) 650 MG CR tablet Take 650 mg by mouth every 8 (eight) hours as needed for pain.    Marland Kitchen amLODipine (NORVASC) 5 MG tablet Take 1 tablet (5 mg total) by mouth daily. 30 tablet 0  . ascorbic acid (VITAMIN C) 500 MG tablet Take 500 mg by mouth daily.    Marland Kitchen buPROPion (WELLBUTRIN XL) 150 MG 24 hr tablet Take 3 tablets (450 mg total) by mouth daily. 270 tablet 3  . carvedilol (COREG) 3.125 MG tablet TAKE 1 TABLET BY MOUTH  TWICE DAILY 180 tablet 3  . Cholecalciferol  (VITAMIN D3) 50 MCG (2000 UT) TABS Take 2,000 Units by mouth daily.     Marland Kitchen Cod Liver Oil CAPS Take 1 capsule by mouth daily. 415 mg    . furosemide (LASIX) 20 MG tablet TAKE 1 TABLET BY MOUTH  DAILY 90 tablet 1  . levothyroxine (SYNTHROID, LEVOTHROID) 100 MCG tablet Take 1 tablet (100 mcg total) by mouth daily before breakfast. 90 tablet 3  . Magnesium 250 MG TABS Take 250 mg by mouth daily.    . meclizine (ANTIVERT) 12.5 MG tablet Take 1 tablet (12.5 mg total) by mouth 3 (three) times daily as needed for dizziness. 30 tablet 0  . methocarbamol (ROBAXIN) 500 MG tablet Take 1 tablet (500 mg total) by mouth every 8 (eight) hours as needed for muscle spasms. 30 tablet 0  . NONFORMULARY OR COMPOUNDED ITEM Diclofenac-Baclofen 2% [id number] 1-2 grams topically four times daily. Before application of cream rub 2 minutes to active max penetration. Quantity 120 grams 120 each 2  . omeprazole (PRILOSEC) 40 MG capsule TAKE 1 CAPSULE BY MOUTH  ONCE DAILY FOR STOMACH 90 capsule 1  . Oxcarbazepine (TRILEPTAL) 300 MG  tablet Take 300 mg by mouth daily.     . potassium chloride (KLOR-CON) 10 MEQ tablet TAKE 1 TABLET BY MOUTH  DAILY 90 tablet 1  . sertraline (ZOLOFT) 100 MG tablet Take 200 mg by mouth daily.     Marland Kitchen spironolactone (ALDACTONE) 25 MG tablet TAKE 1 TABLET BY MOUTH IN  THE MORNING 90 tablet 1  . Vitamin E 400 units TABS Take 400 Units by mouth daily.      No facility-administered medications prior to visit.    PAST MEDICAL HISTORY: Past Medical History:  Diagnosis Date  . Arthritis   . Cancer [facility])    left breast cancer   . Cataracts, bilateral    removed by surgery  . CHF (congestive heart failure) ([place])    PACEMAKER & DEFIB  . Complication of anesthesia    hypotensive after back surgery in 2006  . Depression   . Dyslipidemia   . Fainted 04/21/06   AT [place]  . GERD (gastroesophageal reflux disease)   . Headache(784.0)   . Hearing loss    bilateral hearing aids  . HLD (hyperlipidemia)      diet controlled   . Hypertension   . Hypothyroidism   . ICD (implantable cardiac defibrillator) in place    pt has pacer/icd  . ICD (implantable cardiac defibrillator), biventricular, in situ   . LBBB (left bundle branch block)   . Memory loss   . Nonischemic cardiomyopathy ([place])   . Normal coronary arteries    s/p cardiac cath 2007  . Pacemaker    ICD [employer]  . Syncope   . Systolic CHF ([place])   . Vertigo   . Wears glasses     PAST SURGICAL HISTORY: Past Surgical History:  Procedure Laterality Date  . BACK SURGERY     lumbar fusion   . BREAST LUMPECTOMY Left 2008  . BREAST SURGERY  2000   LUMP REMOVAL. STAGE 1 CANCER  . CARDIAC CATHETERIZATION    . CATARACT EXTRACTION    . COLONOSCOPY    . EYE SURGERY    . IMPLANTABLE CARDIOVERTER DEFIBRILLATOR GENERATOR CHANGE N/A 12/18/2012   Procedure: IMPLANTABLE CARDIOVERTER DEFIBRILLATOR GENERATOR CHANGE;  Surgeon: [name], MD;  Location: [facility] CATH LAB;  Service: Cardiovascular;  Laterality: N/A;  . JOINT REPLACEMENT  06/14/01   right  . LUMBAR FUSION  2006  . MASS EXCISION  11/08/2011   Procedure: EXCISION MASS;  Surgeon: [name], MD;  Location: [facility] ORS;  Service: General;  Laterality: Left;  Excision Left Thigh Mass  . MASTECTOMY W/ SENTINEL NODE BIOPSY Left 06/04/2019   Procedure: LEFT MASTECTOMY WITH SENTINEL LYMPH NODE BIOPSY;  Surgeon: [name], MD;  Location: [place];  Service: General;  Laterality: Left;  Marland Kitchen MASTECTOMY, PARTIAL  2008   GOT PACEMAKER AND DEFIB AT THAT TIME  . PACEMAKER INSERTION  04/23/06  . TOTAL KNEE ARTHROPLASTY  05/17/01   RIGHT KNEE  . TOTAL KNEE ARTHROPLASTY Left 11/29/2014   Procedure: TOTAL LEFT KNEE ARTHROPLASTY;  Surgeon: Paralee Cancel, MD;  Location: [facility] ORS;  Service: Orthopedics;  Laterality: Left;    FAMILY HISTORY: Family History  Problem Relation Age of Onset  . Hypertension Mother   . Arthritis Mother   . Hypertension Father   . Hypertension Brother   . Hypertension  Brother     SOCIAL HISTORY: Social History   Socioeconomic History  . Marital status: Married    Spouse name: Not on file  . Number of children:  1  . Years of education: Masters  . Highest education level: Not on file  Occupational History  . Occupation: Retired  Tobacco Use  . Smoking status: Never Smoker  . Smokeless tobacco: Never Used  Substance and Sexual Activity  . Alcohol use: No  . Drug use: No  . Sexual activity: Not Currently    Birth control/protection: Post-menopausal  Other Topics Concern  . Not on file  Social History Narrative   Lives at home with husband.   Right-handed.      As of 07/28/2014   Diet: No special diet   Caffeine: yes, Chocolate, tea and sodas    Married: YES, 1970   House: Yes, 2 stories, 2-3 persons live in home   Pets: No   Current/Past profession: Engineer, mining, Designer, jewellery    Exercise: Yes 2-3 x weekly   Living Will: Yes   DNR: No   POA/HPOA: No      Social Determinants of Radio broadcast assistant Strain:   . Difficulty of Paying Living Expenses:   Food Insecurity:   . Worried About Charity fundraiser in the Last Year:   . Arboriculturist in the Last Year:   Transportation Needs:   . Film/video editor (Medical):   Marland Kitchen Lack of Transportation (Non-Medical):   Physical Activity:   . Days of Exercise per Week:   . Minutes of Exercise per Session:   Stress:   . Feeling of Stress :   Social Connections:   . Frequency of Communication with Friends and Family:   . Frequency of Social Gatherings with Friends and Family:   . Attends Religious Services:   . Active Member of Clubs or Organizations:   . Attends Archivist Meetings:   Marland Kitchen Marital Status:   Intimate Partner Violence:   . Fear of Current or Ex-Partner:   . Emotionally Abused:   Marland Kitchen Physically Abused:   . Sexually Abused:       PHYSICAL EXAM  Vitals:   07/23/19 1102  BP: 136/84  Pulse: 70  Temp: 98.4 F (36.9 C)  Weight: 121 lb (54.9  kg)   Body mass index is 24.44 kg/m.  Generalized: Well developed, in no acute distress  MMSE - Mini Mental State Exam 07/23/2019 01/21/2019 01/29/2018  Not completed: - (No Data) (No Data)  Orientation to time 4 5 5   Orientation to Place 5 4 5   Registration 3 3 3   Attention/ Calculation 4 1 5   Recall 2 3 1   Language- name 2 objects 2 1 2   Language- repeat 1 1 1   Language- follow 3 step command 3 2 3   Language- follow 3 step command-comments - she didnt fold the paper -  Language- read & follow direction 1 1 1   Write a sentence 1 1 1   Copy design 1 1 1   Copy design-comments 13 animals - -  Total score 27 23 28     Neurological examination  Mentation: Alert oriented to time, place, history taking. Follows all commands speech and language fluent. Participatory, very engaged and cooperative Cranial nerve II-XII: Pupils were equal round reactive to light. Extraocular movements were full, visual field were full on confrontational test. Facial sensation and strength were normal.  Head turning and shoulder shrug were normal and symmetric. Motor: Good strength in all extremities, lymphedema to left arm Sensory: Sensory testing is intact to soft touch on all 4 extremities. No evidence of extinction is noted.  Coordination: Cerebellar  testing reveals good finger-nose-finger and heel-to-shin bilaterally.  Gait and station: Has to rock a few times to stand, pauses before initiating gait, gait is steady, but cautious, tandem gait was unsteady. Reflexes: Deep tendon reflexes are symmetric but depressed bilaterally.  DIAGNOSTIC DATA (LABS, IMAGING, TESTING) - I reviewed patient records, labs, notes, testing and imaging myself where available.  Lab Results  Component Value Date   WBC 6.6 07/17/2019   HGB 12.5 07/17/2019   HCT 37.9 07/17/2019   MCV 87.7 07/17/2019   PLT 141 07/17/2019      Component Value Date/Time   NA 140 07/17/2019 1621   NA 139 10/05/2015 0944   K 4.5 07/17/2019 1621    CL 106 07/17/2019 1621   CO2 27 07/17/2019 1621   GLUCOSE 100 07/17/2019 1621   BUN 31 (H) 07/17/2019 1621   BUN 25 10/05/2015 0944   CREATININE 0.83 07/17/2019 1621   CALCIUM 10.2 07/17/2019 1621   PROT 6.6 07/17/2019 1621   PROT 6.4 10/05/2015 0944   ALBUMIN 3.7 06/01/2019 1221   ALBUMIN 4.3 10/05/2015 0944   AST 20 07/17/2019 1621   ALT 17 07/17/2019 1621   ALKPHOS 54 06/01/2019 1221   BILITOT 0.2 07/17/2019 1621   BILITOT <0.2 10/05/2015 0944   GFRNONAA 65 07/17/2019 1621   GFRAA 76 07/17/2019 1621   Lab Results  Component Value Date   CHOL 182 11/10/2018   HDL 60 11/10/2018   LDLCALC 104 (H) 11/10/2018   TRIG 88 11/10/2018   CHOLHDL 3.0 11/10/2018   No results found for: HGBA1C Lab Results  Component Value Date   VITAMINB12 1,117 (H) 07/28/2014   Lab Results  Component Value Date   TSH 1.52 07/17/2019      ASSESSMENT AND PLAN 86 y.o. year old female  has a past medical history of Arthritis, Cancer ([place]), Cataracts, bilateral, CHF (congestive heart failure) ([place]), Complication of anesthesia, Depression, Dyslipidemia, Fainted (04/21/06), GERD (gastroesophageal reflux disease), Headache(784.0), Hearing loss, HLD (hyperlipidemia), Hypertension, Hypothyroidism, ICD (implantable cardiac defibrillator) in place, ICD (implantable cardiac defibrillator), biventricular, in situ, LBBB (left bundle branch block), Memory loss, Nonischemic cardiomyopathy ([place]), Normal coronary arteries, Pacemaker, Syncope, Systolic CHF ([place]), Vertigo, and Wears glasses. here with:  1. Mild cognitive impairment -MMSE was stable 27/30 -Has been under recent stress, her husband passed away, she been diagnosed with breast cancer, had left mastectomy -She has been unable to tolerate Aricept, Exelon, or Namenda due to side effect -Her memory seems to be relatively stable, considering her recent stressors -She is no longer driving -return in 6 months or sooner if needed  2. Dizziness -Since  mastectomy -Will refill meclizine for patient -Scheduled for CT head 08/05/2019 -Has seen PCP, possibly considering vestibular rehab  I spent 30 minutes of face-to-face and non-face-to-face time with patient.  This included previsit chart review, lab review, study review, order entry, electronic health record documentation, patient education.   Butler Denmark, AGNP-C, DNP 07/23/2019, 11:18 AM [facility] [address], [place], [place] [address] [phone]

## 2019-07-27 ENCOUNTER — Other Ambulatory Visit: Payer: Self-pay

## 2019-07-27 ENCOUNTER — Ambulatory Visit: Payer: Medicare Other

## 2019-07-27 DIAGNOSIS — Z483 Aftercare following surgery for neoplasm: Secondary | ICD-10-CM

## 2019-07-27 DIAGNOSIS — M25612 Stiffness of left shoulder, not elsewhere classified: Secondary | ICD-10-CM

## 2019-07-27 DIAGNOSIS — M25512 Pain in left shoulder: Secondary | ICD-10-CM

## 2019-07-27 DIAGNOSIS — I972 Postmastectomy lymphedema syndrome: Secondary | ICD-10-CM

## 2019-07-27 NOTE — Therapy (Signed)
[place], [place], [address] Phone: [phone]   Fax:  [phone]  Physical Therapy Treatment  Patient Details  Name: [name] MRN: [id number] Date of Birth: 1933-08-07 Referring Provider (PT): Dr. Irene Limbo  (Dr Barry Dienes is surgeon)   Encounter Date: 07/27/2019  PT End of Session - 07/27/19 1501    Visit Number  8    Number of Visits  17    Date for PT Re-Evaluation  08/31/19    PT Start Time  1404    PT Stop Time  1507    PT Time Calculation (min)  63 min    Activity Tolerance  Patient tolerated treatment well    Behavior During Therapy  [facility] for tasks assessed/performed       Past Medical History:  Diagnosis Date  . Arthritis   . Cancer [facility])    left breast cancer   . Cataracts, bilateral    removed by surgery  . CHF (congestive heart failure) ([place])    PACEMAKER & DEFIB  . Complication of anesthesia    hypotensive after back surgery in 2006  . Depression   . Dyslipidemia   . Fainted 04/21/06   AT [place]  . GERD (gastroesophageal reflux disease)   . Headache(784.0)   . Hearing loss    bilateral hearing aids  . HLD (hyperlipidemia)    diet controlled   . Hypertension   . Hypothyroidism   . ICD (implantable cardiac defibrillator) in place    pt has pacer/icd  . ICD (implantable cardiac defibrillator), biventricular, in situ   . LBBB (left bundle branch block)   . Memory loss   . Nonischemic cardiomyopathy ([place])   . Normal coronary arteries    s/p cardiac cath 2007  . Pacemaker    ICD [employer]  . Syncope   . Systolic CHF ([place])   . Vertigo   . Wears glasses     Past Surgical History:  Procedure Laterality Date  . BACK SURGERY     lumbar fusion   . BREAST LUMPECTOMY Left 2008  . BREAST SURGERY  2000   LUMP REMOVAL. STAGE 1 CANCER  . CARDIAC CATHETERIZATION    . CATARACT EXTRACTION    . COLONOSCOPY    . EYE SURGERY    . IMPLANTABLE CARDIOVERTER  DEFIBRILLATOR GENERATOR CHANGE N/A 12/18/2012   Procedure: IMPLANTABLE CARDIOVERTER DEFIBRILLATOR GENERATOR CHANGE;  Surgeon: [name], MD;  Location: [facility] CATH LAB;  Service: Cardiovascular;  Laterality: N/A;  . JOINT REPLACEMENT  06/14/01   right  . LUMBAR FUSION  2006  . MASS EXCISION  11/08/2011   Procedure: EXCISION MASS;  Surgeon: [name], MD;  [facility];  Service: General;  Laterality: Left;  Excision Left Thigh Mass  . MASTECTOMY W/ SENTINEL NODE BIOPSY Left 06/04/2019   Procedure: LEFT MASTECTOMY WITH SENTINEL LYMPH NODE BIOPSY;  Surgeon: [name], MD;  Location: [facility];  Service: General;  Laterality: Left;  Marland Kitchen MASTECTOMY, PARTIAL  2008   GOT PACEMAKER AND DEFIB AT THAT TIME  . PACEMAKER INSERTION  04/23/06  . TOTAL KNEE ARTHROPLASTY  05/17/01   RIGHT KNEE  . TOTAL KNEE ARTHROPLASTY Left 11/29/2014   Procedure: TOTAL LEFT KNEE ARTHROPLASTY;  Surgeon: Paralee Cancel, MD;  [facility];  Service: Orthopedics;  Laterality: Left;    There were no vitals filed for this visit.  Subjective Assessment - 07/27/19 1411    Subjective  I went back to my  doctor last week for a check up again about my dizziness and since it's stil there they scheduled me for a CT scan next week (08/05/19). My Lt arm is doing alot better. I've been reaching with it more and when I feel it trying to get tight I do my stretches and they help.    Pertinent History  left breast cancer with left mastectomy on 06/04/2019 with 5 lymph nodes . past history includes lumpectomy in 2008 with radiation and no lymph nodes removed she has chronic left breast pain and seroma with excisional biopsy in 2011.  Hx includes CHR with implanted defibrillator in left chest, bilateral TKR, osteoporosis    Patient Stated Goals  be able to lift my arm better to comb my hair    Currently in Pain?  No/denies         [facility] PT Assessment - 07/27/19 0001      AROM   Left Shoulder Flexion  140 Degrees    Left Shoulder ABduction   127 Degrees                   [facility] Adult PT Treatment/Exercise - 07/27/19 0001      Shoulder Exercises: [place]   Flexion  1 minute    Flexion Limitations  Demo to decrease Lt scapular compensation which pt was able to do well after cuing    ABduction  2 minutes    ABduction Limitations  Pt coninues with good technique after brief reminder to decrease Lt scapular compensation      Shoulder Exercises: Stretch   Wall Stretch - ABduction  2 reps;10 seconds   in doorway to teach myofascial release at anterior elbow     Manual Therapy   Myofascial Release  to tight scar tissue at left lateral chest     Manual Lymphatic Drainage (MLD)  In supine: short neck, bil shoulder collectors, 5 diaphragmatic breaths, Rt axillary and L inguinal nodes, anterior inter-axillary anastomosis but modified to avoid pace maker, L axillo-inguinal anastomosis, lateral L brachium, medial to lateral L brachium, lateral L brachium, re-worked anastomosis (same as before), bottle neck, all surfaces of antebrachium, dorsum of the hand, re-worked all surfaces    Passive ROM  To Lt shoulder into flexion, abduction and D2 to pts tolerance               PT Short Term Goals - 06/30/19 1819      PT SHORT TERM GOAL #1   Title  Pt  will report pain in left shoulder is decreased to 6/10    Baseline  8/10 on 06/30/2019    Time  4    Status  New      PT SHORT TERM GOAL #2   Title  Pt wil be independent in a basic HEP for shoulder ROM    Time  4    Period  Weeks    Status  New        PT Long Term Goals - 06/30/19 1820      PT LONG TERM GOAL #1   Title  Pt will have reduction in left forearm at 15 cm proximla to ulnar styloid by 1 cm    Time  8    Period  Weeks    Status  New      PT LONG TERM GOAL #2   Title  Pt and daugher will report they understand lymphedema risk reduction practices and how to manage left arm lymphedema at  home    Time  8    Period  Weeks    Status  New      PT LONG  TERM GOAL #3   Title  Pt will have 140 degrees of painfree left shoulder abduction so that she can easily perfom her ADLs at home    Baseline  115 and painful on 06/30/2019    Time  8    Period  Weeks    Status  New      PT LONG TERM GOAL #4   Title  Patient will be Independent with HEP for shoulder ROM and strength    Time  8    Period  Weeks    Status  New            Plan - 07/27/19 1507    Clinical Impression Statement  Her A/ROM is continuing to improve. She tolerated more slightly aggressive myofascial release to cording at upper arm and anterior elbow well. Also instructed her how to replicate this at home in doorway. She was ableto return good demo.    Personal Factors and Comorbidities  Age    Comorbidities  previous triple negataive left breast cancer, seroma after lumpectomy, CHF with defibrillator, Bilateral TKR osteoporosis    Examination-Activity Limitations  Reach Overhead;Bathing    Stability/Clinical Decision Making  Stable/Uncomplicated    Rehab Potential  Good    PT Frequency  2x / week    PT Duration  8 weeks    PT Treatment/Interventions  ADLs/Self Care Home Management;DME Instruction;Therapeutic activities;Patient/family education;Orthotic Fit/Training;Manual techniques;Manual lymph drainage;Compression bandaging;Scar mobilization;Taping;Passive range of motion    PT Next Visit Plan  focus on scar at lateral chest, cont MLD to left arm and gentle ROM being mindfull of defibrillator, progress exercise as pain decreases; manual techniques as needed for cording in left axilla    PT Home Exercise Plan  Supine dowel exercises; supine scapular series; doorway stretch for abduction    Consulted and Agree with Plan of Care  Patient       Patient will benefit from skilled therapeutic intervention in order to improve the following deficits and impairments:  Decreased activity tolerance, Decreased knowledge of precautions, Decreased knowledge of use of DME, Decreased  range of motion, Decreased strength, Increased fascial restricitons, Increased edema, Impaired perceived functional ability, Increased muscle spasms, Impaired UE functional use, Postural dysfunction, Pain  Visit Diagnosis: Aftercare following surgery for neoplasm  Acute pain of left shoulder  Postmastectomy lymphedema  Stiffness of left shoulder joint     Problem List Patient Active Problem List   Diagnosis Date Noted  . Recurrent major depressive disorder, in partial remission ([place]) 07/17/2019  . Status post left mastectomy 07/01/2019  . Breast cancer of lower-outer quadrant of left female breast ([place]) 06/04/2019  . Candida infection, oral 01/15/2019  . Senile purpura ([place]) 04/14/2018  . Lumbar post-laminectomy syndrome 12/03/2017  . Lumbar spondylosis 12/03/2017  . History of back surgery 09/01/2017  . Constipation 09/01/2017  . Hypothyroidism due to acquired atrophy of thyroid 09/01/2017  . Mixed hyperlipidemia 09/01/2017  . Age-related osteoporosis without current pathological fracture 09/01/2017  . High risk medication use 09/01/2017  . Arthritis of hand 05/17/2017  . Atherosclerosis of native arteries of extremity with intermittent claudication ([place]) 05/15/2017  . Status post total bilateral knee replacement 12/20/2016  . Gait abnormality 07/16/2016  . Chronic low back pain 07/16/2016  . Mild cognitive impairment 07/16/2016  . Wrist pain 05/10/2015  . S/P left TKA 11/29/2014  .  S/P knee replacement 11/29/2014  . Spontaneous bruising 08/27/2014  . Numbness and tingling in right hand 07/28/2014  . Essential tremor 07/28/2014  . Dizziness 05/28/2014  . Shaky 05/28/2014  . Memory loss 05/28/2014  . Depression 05/06/2014  . Lipoma of left upper thigh 3x5 cm 09/28/2011  . Cerebral vascular accident ([place]) 08/09/2011  . Syncope 08/09/2011  . History of breast cancer T1bNxMx, s/p BCT 2008, triple negative 01/26/2011  . Chronic L breast pain with chronic recurrent  seroma, s/p excisional biopsy 01/12/2010 01/26/2011  . Fainted   . ICD (implantable cardioverter-defibrillator), biventricular, in situ 08/02/2010  . Chronic systolic heart failure ([place]) 08/02/2010  . Hypertension 08/02/2010    [name],PTA 07/27/2019, 3:15 PM  [place], [place], [address] Phone: [phone]   Fax:  [phone]  Name: [name] MRN: [id number] Date of Birth: [birth year]

## 2019-07-27 NOTE — Patient Instructions (Signed)
Stretch Break - Shoulder Roll    Roll shoulders forward, up, back, and down to complete a circle _10___ times. Repeat __1__ times every __2__ hours.  Scapular Retraction:  (Standing)    With elbows bent to 90, pinch shoulder blades together and rotate arms out, keeping elbows bent. Repeat _10___ times per set. Do __1__ sets per session. Do __5__ sessions per day.   In doorway slide arm up until stretch felt in armpit and at side. 3x holding 10-20 sec.

## 2019-07-29 ENCOUNTER — Ambulatory Visit: Payer: Medicare Other | Admitting: Physical Therapy

## 2019-07-29 ENCOUNTER — Other Ambulatory Visit: Payer: Self-pay

## 2019-07-29 ENCOUNTER — Encounter: Payer: Self-pay | Admitting: Physical Therapy

## 2019-07-29 DIAGNOSIS — Z483 Aftercare following surgery for neoplasm: Secondary | ICD-10-CM | POA: Diagnosis not present

## 2019-07-29 DIAGNOSIS — M25612 Stiffness of left shoulder, not elsewhere classified: Secondary | ICD-10-CM

## 2019-07-29 DIAGNOSIS — I972 Postmastectomy lymphedema syndrome: Secondary | ICD-10-CM

## 2019-07-29 DIAGNOSIS — M25512 Pain in left shoulder: Secondary | ICD-10-CM

## 2019-07-29 NOTE — Therapy (Signed)
[place], [place], [address] Phone: [phone]   Fax:  [phone]  Physical Therapy Treatment  Patient Details  Name: [name] MRN: [id number] Date of Birth: 10-21-1933 Referring Provider (PT): Dr. Irene Limbo  (Dr Barry Dienes is surgeon)   Encounter Date: 07/29/2019  PT End of Session - 07/29/19 1631    Visit Number  9    Number of Visits  17    Date for PT Re-Evaluation  08/31/19    PT Start Time  1300    PT Stop Time  1345    PT Time Calculation (min)  45 min    Activity Tolerance  Patient tolerated treatment well    Behavior During Therapy  [facility] for tasks assessed/performed       Past Medical History:  Diagnosis Date  . Arthritis   . Cancer [facility])    left breast cancer   . Cataracts, bilateral    removed by surgery  . CHF (congestive heart failure) ([place])    PACEMAKER & DEFIB  . Complication of anesthesia    hypotensive after back surgery in 2006  . Depression   . Dyslipidemia   . Fainted 04/21/06   AT [place]  . GERD (gastroesophageal reflux disease)   . Headache(784.0)   . Hearing loss    bilateral hearing aids  . HLD (hyperlipidemia)    diet controlled   . Hypertension   . Hypothyroidism   . ICD (implantable cardiac defibrillator) in place    pt has pacer/icd  . ICD (implantable cardiac defibrillator), biventricular, in situ   . LBBB (left bundle branch block)   . Memory loss   . Nonischemic cardiomyopathy ([place])   . Normal coronary arteries    s/p cardiac cath 2007  . Pacemaker    ICD [employer]  . Syncope   . Systolic CHF ([place])   . Vertigo   . Wears glasses     Past Surgical History:  Procedure Laterality Date  . BACK SURGERY     lumbar fusion   . BREAST LUMPECTOMY Left 2008  . BREAST SURGERY  2000   LUMP REMOVAL. STAGE 1 CANCER  . CARDIAC CATHETERIZATION    . CATARACT EXTRACTION    . COLONOSCOPY    . EYE SURGERY    . IMPLANTABLE CARDIOVERTER  DEFIBRILLATOR GENERATOR CHANGE N/A 12/18/2012   Procedure: IMPLANTABLE CARDIOVERTER DEFIBRILLATOR GENERATOR CHANGE;  Surgeon: [name], MD;  Location: [facility] CATH LAB;  Service: Cardiovascular;  Laterality: N/A;  . JOINT REPLACEMENT  06/14/01   right  . LUMBAR FUSION  2006  . MASS EXCISION  11/08/2011   Procedure: EXCISION MASS;  Surgeon: [name], MD;  [facility];  Service: General;  Laterality: Left;  Excision Left Thigh Mass  . MASTECTOMY W/ SENTINEL NODE BIOPSY Left 06/04/2019   Procedure: LEFT MASTECTOMY WITH SENTINEL LYMPH NODE BIOPSY;  Surgeon: [name], MD;  Location: [place];  Service: General;  Laterality: Left;  Marland Kitchen MASTECTOMY, PARTIAL  2008   GOT PACEMAKER AND DEFIB AT THAT TIME  . PACEMAKER INSERTION  04/23/06  . TOTAL KNEE ARTHROPLASTY  05/17/01   RIGHT KNEE  . TOTAL KNEE ARTHROPLASTY Left 11/29/2014   Procedure: TOTAL LEFT KNEE ARTHROPLASTY;  Surgeon: Paralee Cancel, MD;  [facility];  Service: Orthopedics;  Laterality: Left;    There were no vitals filed for this visit.  Subjective Assessment - 07/29/19 1316    Subjective  Pt says her dizziness is  better.  She reports she has some cramping in her buttock area that is worse in the morning and at night and she wonders what the cause is    Pertinent History  left breast cancer with left mastectomy on 06/04/2019 with 5 lymph nodes . past history includes lumpectomy in 2008 with radiation and no lymph nodes removed she has chronic left breast pain and seroma with excisional biopsy in 2011.  Hx includes CHR with implanted defibrillator in left chest, bilateral TKR, osteoporosis    Patient Stated Goals  be able to lift my arm better to comb my hair  05/31/2019:  pt says she is able to do this now.    Currently in Pain?  No/denies            LYMPHEDEMA/ONCOLOGY QUESTIONNAIRE - 07/29/19 1311      Left Upper Extremity Lymphedema   10 cm Proximal to Olecranon Process  26.8 cm    Olecranon Process  23.5 cm    15 cm  Proximal to Ulnar Styloid Process  22.8 cm    10 cm Proximal to Ulnar Styloid Process  20 cm    Just Proximal to Ulnar Styloid Process  15.9 cm    Across Hand at [employer]  17.4 cm    At [place] of 2nd Digit  5.9 cm                [facility] Adult PT Treatment/Exercise - 07/29/19 0001      Exercises   Exercises  Shoulder;Knee/Hip      Knee/Hip Exercises: Standing   Other Standing Knee Exercises  sit to stand from raised surface with glute set x 10 reps and cues for posture to       Shoulder Exercises: Supine   Protraction  AROM;Left;5 reps      Shoulder Exercises: Seated   Diagonals  AROM;Left;5 reps      Manual Therapy   Manual Therapy  Edema management;Soft tissue mobilization;Myofascial release;Manual Lymphatic Drainage (MLD)    Manual therapy comments  remeasured arm     Myofascial Release  to tight scar tissue at left lateral chest     Manual Lymphatic Drainage (MLD)  In supine: short neck, bil shoulder collectors, 5 diaphragmatic breaths, Rt axillary and L inguinal nodes, anterior inter-axillary anastomosis but modified to avoid pace maker, L axillo-inguinal anastomosis, lateral L brachium, medial to lateral L brachium, lateral L brachium, re-worked anastomosis (same as before), bottle neck, all surfaces of antebrachium, dorsum of the hand, re-worked all surfaces    Passive ROM  To Lt shoulder into flexion, abduction and D2 to pts tolerance               PT Short Term Goals - 06/30/19 1819      PT SHORT TERM GOAL #1   Title  Pt  will report pain in left shoulder is decreased to 6/10    Baseline  8/10 on 06/30/2019    Time  4    Status  New      PT SHORT TERM GOAL #2   Title  Pt wil be independent in a basic HEP for shoulder ROM    Time  4    Period  Weeks    Status  New        PT Long Term Goals - 06/30/19 1820      PT LONG TERM GOAL #1   Title  Pt will have reduction in left forearm at 15 cm proximla  to ulnar styloid by 1 cm    Time  8     Period  Weeks    Status  New      PT LONG TERM GOAL #2   Title  Pt and daugher will report they understand lymphedema risk reduction practices and how to manage left arm lymphedema at home    Time  8    Period  Weeks    Status  New      PT LONG TERM GOAL #3   Title  Pt will have 140 degrees of painfree left shoulder abduction so that she can easily perfom her ADLs at home    Baseline  115 and painful on 06/30/2019    Time  8    Period  Weeks    Status  New      PT LONG TERM GOAL #4   Title  Patient will be Independent with HEP for shoulder ROM and strength    Time  8    Period  Weeks    Status  New            Plan - 07/29/19 1631    Clinical Impression Statement  Pt continues to improve but still has thick cording in left axilla with tightness in left chest. Left arm lymphedema is improved.  She will be measured for a velcro arm piece and glove on Friday.    Comorbidities  previous triple negataive left breast cancer, seroma after lumpectomy, CHF with defibrillator, Bilateral TKR osteoporosis    Examination-Activity Limitations  Reach Overhead;Bathing    Stability/Clinical Decision Making  Stable/Uncomplicated    Rehab Potential  Good    PT Frequency  2x / week    PT Duration  8 weeks    PT Treatment/Interventions  ADLs/Self Care Home Management;DME Instruction;Therapeutic activities;Patient/family education;Orthotic Fit/Training;Manual techniques;Manual lymph drainage;Compression bandaging;Scar mobilization;Taping;Passive range of motion    PT Next Visit Plan  focus on scar at lateral chest,  and cording in axilla cont MLD to left arm and gentle ROM being mindfull of defibrillator, progress exercise as pain decreases; manual techniques as needed for cording in left axilla    PT Home Exercise Plan  Supine dowel exercises; supine scapular series; doorway stretch for abduction       Patient will benefit from skilled therapeutic intervention in order to improve the following  deficits and impairments:  Decreased activity tolerance, Decreased knowledge of precautions, Decreased knowledge of use of DME, Decreased range of motion, Decreased strength, Increased fascial restricitons, Increased edema, Impaired perceived functional ability, Increased muscle spasms, Impaired UE functional use, Postural dysfunction, Pain  Visit Diagnosis: Aftercare following surgery for neoplasm  Acute pain of left shoulder  Postmastectomy lymphedema  Stiffness of left shoulder joint     Problem List Patient Active Problem List   Diagnosis Date Noted  . Recurrent major depressive disorder, in partial remission ([place]) 07/17/2019  . Status post left mastectomy 07/01/2019  . Breast cancer of lower-outer quadrant of left female breast ([place]) 06/04/2019  . Candida infection, oral 01/15/2019  . Senile purpura ([place]) 04/14/2018  . Lumbar post-laminectomy syndrome 12/03/2017  . Lumbar spondylosis 12/03/2017  . History of back surgery 09/01/2017  . Constipation 09/01/2017  . Hypothyroidism due to acquired atrophy of thyroid 09/01/2017  . Mixed hyperlipidemia 09/01/2017  . Age-related osteoporosis without current pathological fracture 09/01/2017  . High risk medication use 09/01/2017  . Arthritis of hand 05/17/2017  . Atherosclerosis of native arteries of extremity with intermittent claudication ([place]) 05/15/2017  .  Status post total bilateral knee replacement 12/20/2016  . Gait abnormality 07/16/2016  . Chronic low back pain 07/16/2016  . Mild cognitive impairment 07/16/2016  . Wrist pain 05/10/2015  . S/P left TKA 11/29/2014  . S/P knee replacement 11/29/2014  . Spontaneous bruising 08/27/2014  . Numbness and tingling in right hand 07/28/2014  . Essential tremor 07/28/2014  . Dizziness 05/28/2014  . Shaky 05/28/2014  . Memory loss 05/28/2014  . Depression 05/06/2014  . Lipoma of left upper thigh 3x5 cm 09/28/2011  . Cerebral vascular accident ([place]) 08/09/2011  . Syncope  08/09/2011  . History of breast cancer T1bNxMx, s/p BCT 2008, triple negative 01/26/2011  . Chronic L breast pain with chronic recurrent seroma, s/p excisional biopsy 01/12/2010 01/26/2011  . Fainted   . ICD (implantable cardioverter-defibrillator), biventricular, in situ 08/02/2010  . Chronic systolic heart failure ([place]) 08/02/2010  . Hypertension 08/02/2010   Donato Heinz. Owens Shark PT  Norwood Levo 07/29/2019, 4:34 PM  [place], [place], [address] Phone: [phone]   Fax:  [phone]  Name: [name] MRN: [id number] Date of Birth: 30-Sep-1933

## 2019-07-30 ENCOUNTER — Other Ambulatory Visit: Payer: Self-pay | Admitting: Family

## 2019-07-30 DIAGNOSIS — I10 Essential (primary) hypertension: Secondary | ICD-10-CM

## 2019-08-03 ENCOUNTER — Other Ambulatory Visit: Payer: Self-pay

## 2019-08-03 ENCOUNTER — Ambulatory Visit: Payer: Medicare Other

## 2019-08-03 DIAGNOSIS — Z483 Aftercare following surgery for neoplasm: Secondary | ICD-10-CM | POA: Diagnosis not present

## 2019-08-03 DIAGNOSIS — M25612 Stiffness of left shoulder, not elsewhere classified: Secondary | ICD-10-CM

## 2019-08-03 DIAGNOSIS — I972 Postmastectomy lymphedema syndrome: Secondary | ICD-10-CM

## 2019-08-03 DIAGNOSIS — M25512 Pain in left shoulder: Secondary | ICD-10-CM

## 2019-08-03 NOTE — Therapy (Addendum)
[place], [place], [address] Phone: [phone]   Fax:  [phone]  Physical Therapy Treatment 10th Visit Note  Patient Details  Name: [name] MRN: [id number] Date of Birth: 1934/02/23 Referring Provider (PT): Dr. Irene Limbo  (Dr Barry Dienes is surgeon)  Progress Note Reporting Period 06/30/19 to 08/04/19  See note below for Objective Data and Assessment of Progress/Goals.      Encounter Date: 08/03/2019  PT End of Session - 08/03/19 1507    Visit Number  10    Number of Visits  17    Date for PT Re-Evaluation  08/31/19    PT Start Time  1404    PT Stop Time  1505    PT Time Calculation (min)  61 min    Activity Tolerance  Patient tolerated treatment well    Behavior During Therapy  [facility] for tasks assessed/performed       Past Medical History:  Diagnosis Date  . Arthritis   . Cancer [facility])    left breast cancer   . Cataracts, bilateral    removed by surgery  . CHF (congestive heart failure) ([place])    PACEMAKER & DEFIB  . Complication of anesthesia    hypotensive after back surgery in 2006  . Depression   . Dyslipidemia   . Fainted 04/21/06   AT [place]  . GERD (gastroesophageal reflux disease)   . Headache(784.0)   . Hearing loss    bilateral hearing aids  . HLD (hyperlipidemia)    diet controlled   . Hypertension   . Hypothyroidism   . ICD (implantable cardiac defibrillator) in place    pt has pacer/icd  . ICD (implantable cardiac defibrillator), biventricular, in situ   . LBBB (left bundle branch block)   . Memory loss   . Nonischemic cardiomyopathy ([place])   . Normal coronary arteries    s/p cardiac cath 2007  . Pacemaker    ICD [employer]  . Syncope   . Systolic CHF ([place])   . Vertigo   . Wears glasses     Past Surgical History:  Procedure Laterality Date  . BACK SURGERY     lumbar fusion   . BREAST LUMPECTOMY Left 2008  . BREAST SURGERY  2000   LUMP REMOVAL.  STAGE 1 CANCER  . CARDIAC CATHETERIZATION    . CATARACT EXTRACTION    . COLONOSCOPY    . EYE SURGERY    . IMPLANTABLE CARDIOVERTER DEFIBRILLATOR GENERATOR CHANGE N/A 12/18/2012   Procedure: IMPLANTABLE CARDIOVERTER DEFIBRILLATOR GENERATOR CHANGE;  Surgeon: [name], MD;  Location: [facility] CATH LAB;  Service: Cardiovascular;  Laterality: N/A;  . JOINT REPLACEMENT  06/14/01   right  . LUMBAR FUSION  2006  . MASS EXCISION  11/08/2011   Procedure: EXCISION MASS;  Surgeon: [name], MD;  [facility];  Service: General;  Laterality: Left;  Excision Left Thigh Mass  . MASTECTOMY W/ SENTINEL NODE BIOPSY Left 06/04/2019   Procedure: LEFT MASTECTOMY WITH SENTINEL LYMPH NODE BIOPSY;  Surgeon: [name], MD;  Location: [place];  Service: General;  Laterality: Left;  Marland Kitchen MASTECTOMY, PARTIAL  2008   GOT PACEMAKER AND DEFIB AT THAT TIME  . PACEMAKER INSERTION  04/23/06  . TOTAL KNEE ARTHROPLASTY  05/17/01   RIGHT KNEE  . TOTAL KNEE ARTHROPLASTY Left 11/29/2014   Procedure: TOTAL LEFT KNEE ARTHROPLASTY;  Surgeon: Paralee Cancel, MD;  [facility];  Service: Orthopedics;  Laterality: Left;  There were no vitals filed for this visit.  Subjective Assessment - 08/03/19 1407    Subjective  Pt reports dizziness feeling about the same. CT scan is tomorrow. Starting to notice that she's able to reach into higher shelves and can reach the back of her head now as well.    Pertinent History  left breast cancer with left mastectomy on 06/04/2019 with 5 lymph nodes . past history includes lumpectomy in 2008 with radiation and no lymph nodes removed she has chronic left breast pain and seroma with excisional biopsy in 2011.  Hx includes CHR with implanted defibrillator in left chest, bilateral TKR, osteoporosis    Patient Stated Goals  be able to lift my arm better to comb my hair  05/31/2019:  pt says she is able to do this now.    Currently in Pain?  No/denies                       [facility] Adult  PT Treatment/Exercise - 08/03/19 0001      Manual Therapy   Myofascial Release  to tight scar tissue at left lateral chest     Manual Lymphatic Drainage (MLD)  In supine: short neck, bil shoulder collectors, 5 diaphragmatic breaths, Rt axillary and L inguinal nodes, anterior inter-axillary anastomosis but modified to avoid pace maker, L axillo-inguinal anastomosis, lateral L brachium, medial to lateral L brachium, lateral L brachium, re-worked anastomosis (same as before), bottle neck, all surfaces of antebrachium, dorsum of the hand, re-worked all surfaces    Passive ROM  To Lt shoulder into flexion, abduction and D2 to pts tolerance               PT Short Term Goals - 06/30/19 1819      PT SHORT TERM GOAL #1   Title  Pt  will report pain in left shoulder is decreased to 6/10    Baseline  8/10 on 06/30/2019    Time  4    Status  New      PT SHORT TERM GOAL #2   Title  Pt wil be independent in a basic HEP for shoulder ROM    Time  4    Period  Weeks    Status  New        PT Long Term Goals - 08/03/19 1745      PT LONG TERM GOAL #1   Title  Pt will have reduction in left forearm at 15 cm proximla to ulnar styloid by 1 cm    Baseline  23.8 cm at baseline; 22.8 cm - 08/03/19    Status  Achieved      PT LONG TERM GOAL #2   Title  Pt and daugher will report they understand lymphedema risk reduction practices and how to manage left arm lymphedema at home    Baseline  Pt has been measured for compression garments - 08/03/19    Status  Partially Met      PT LONG TERM GOAL #3   Title  Pt will have 140 degrees of painfree left shoulder abduction so that she can easily perfom her ADLs at home    Baseline  115 and painful on 06/30/2019; 122 degrees - 08/03/19    Status  On-going      PT LONG TERM GOAL #4   Title  Patient will be Independent with HEP for shoulder ROM and strength    Status  Partially Met  Patient will benefit from skilled therapeutic  intervention in order to improve the following deficits and impairments:     Visit Diagnosis: Aftercare following surgery for neoplasm  Acute pain of left shoulder  Postmastectomy lymphedema  Stiffness of left shoulder joint     Problem List Patient Active Problem List   Diagnosis Date Noted  . Recurrent major depressive disorder, in partial remission ([place]) 07/17/2019  . Status post left mastectomy 07/01/2019  . Breast cancer of lower-outer quadrant of left female breast ([place]) 06/04/2019  . Candida infection, oral 01/15/2019  . Senile purpura ([place]) 04/14/2018  . Lumbar post-laminectomy syndrome 12/03/2017  . Lumbar spondylosis 12/03/2017  . History of back surgery 09/01/2017  . Constipation 09/01/2017  . Hypothyroidism due to acquired atrophy of thyroid 09/01/2017  . Mixed hyperlipidemia 09/01/2017  . Age-related osteoporosis without current pathological fracture 09/01/2017  . High risk medication use 09/01/2017  . Arthritis of hand 05/17/2017  . Atherosclerosis of native arteries of extremity with intermittent claudication ([place]) 05/15/2017  . Status post total bilateral knee replacement 12/20/2016  . Gait abnormality 07/16/2016  . Chronic low back pain 07/16/2016  . Mild cognitive impairment 07/16/2016  . Wrist pain 05/10/2015  . S/P left TKA 11/29/2014  . S/P knee replacement 11/29/2014  . Spontaneous bruising 08/27/2014  . Numbness and tingling in right hand 07/28/2014  . Essential tremor 07/28/2014  . Dizziness 05/28/2014  . Shaky 05/28/2014  . Memory loss 05/28/2014  . Depression 05/06/2014  . Lipoma of left upper thigh 3x5 cm 09/28/2011  . Cerebral vascular accident ([place]) 08/09/2011  . Syncope 08/09/2011  . History of breast cancer T1bNxMx, s/p BCT 2008, triple negative 01/26/2011  . Chronic L breast pain with chronic recurrent seroma, s/p excisional biopsy 01/12/2010 01/26/2011  . Fainted   . ICD (implantable cardioverter-defibrillator), biventricular, in  situ 08/02/2010  . Chronic systolic heart failure ([place]) 08/02/2010  . Hypertension 08/02/2010    Otelia Limes, PTA 08/03/2019, 5:52 PM  Tomma Rakers, PT 08/04/19 8:04 AM   [place], [place], [address] Phone: [phone]   Fax:  [phone]  Name: Annelle Behrendt MRN: [id number] Date of Birth: [birth year]

## 2019-08-05 ENCOUNTER — Ambulatory Visit: Payer: Medicare Other | Admitting: Physical Therapy

## 2019-08-05 ENCOUNTER — Other Ambulatory Visit: Payer: Self-pay

## 2019-08-05 ENCOUNTER — Ambulatory Visit
Admission: RE | Admit: 2019-08-05 | Discharge: 2019-08-05 | Disposition: A | Discharge status: Home / Self Care | Payer: Medicare Other | Source: Ambulatory Visit | Attending: Nurse Practitioner | Admitting: Nurse Practitioner

## 2019-08-05 DIAGNOSIS — Z483 Aftercare following surgery for neoplasm: Secondary | ICD-10-CM

## 2019-08-05 DIAGNOSIS — M25612 Stiffness of left shoulder, not elsewhere classified: Secondary | ICD-10-CM

## 2019-08-05 DIAGNOSIS — R42 Dizziness and giddiness: Secondary | ICD-10-CM

## 2019-08-05 DIAGNOSIS — M25512 Pain in left shoulder: Secondary | ICD-10-CM

## 2019-08-05 DIAGNOSIS — R413 Other amnesia: Secondary | ICD-10-CM

## 2019-08-05 NOTE — Therapy (Signed)
[place], [place], [address] Phone: [phone]   Fax:  [phone]  Physical Therapy Treatment  Patient Details  Name: [name] MRN: [id number] Date of Birth: [birth year] Referring Provider (PT): Dr. Irene Limbo  (Dr Barry Dienes is surgeon)   Encounter Date: 08/05/2019  PT End of Session - 08/05/19 1354    Visit Number  11    Number of Visits  17    Date for PT Re-Evaluation  08/31/19    PT Start Time  1300    PT Stop Time  1345    PT Time Calculation (min)  45 min    Activity Tolerance  Patient tolerated treatment well    Behavior During Therapy  [facility] for tasks assessed/performed       Past Medical History:  Diagnosis Date  . Arthritis   . Cancer [facility])    left breast cancer   . Cataracts, bilateral    removed by surgery  . CHF (congestive heart failure) ([place])    PACEMAKER & DEFIB  . Complication of anesthesia    hypotensive after back surgery in 2006  . Depression   . Dyslipidemia   . Fainted 04/21/06   AT [place]  . GERD (gastroesophageal reflux disease)   . Headache(784.0)   . Hearing loss    bilateral hearing aids  . HLD (hyperlipidemia)    diet controlled   . Hypertension   . Hypothyroidism   . ICD (implantable cardiac defibrillator) in place    pt has pacer/icd  . ICD (implantable cardiac defibrillator), biventricular, in situ   . LBBB (left bundle branch block)   . Memory loss   . Nonischemic cardiomyopathy ([place])   . Normal coronary arteries    s/p cardiac cath 2007  . Pacemaker    ICD [employer]  . Syncope   . Systolic CHF ([place])   . Vertigo   . Wears glasses     Past Surgical History:  Procedure Laterality Date  . BACK SURGERY     lumbar fusion   . BREAST LUMPECTOMY Left 2008  . BREAST SURGERY  2000   LUMP REMOVAL. STAGE 1 CANCER  . CARDIAC CATHETERIZATION    . CATARACT EXTRACTION    . COLONOSCOPY    . EYE SURGERY    . IMPLANTABLE CARDIOVERTER  DEFIBRILLATOR GENERATOR CHANGE N/A 12/18/2012   Procedure: IMPLANTABLE CARDIOVERTER DEFIBRILLATOR GENERATOR CHANGE;  Surgeon: [name], MD;  Location: [facility] CATH LAB;  Service: Cardiovascular;  Laterality: N/A;  . JOINT REPLACEMENT  06/14/01   right  . LUMBAR FUSION  2006  . MASS EXCISION  11/08/2011   Procedure: EXCISION MASS;  Surgeon: [name], MD;  [facility];  Service: General;  Laterality: Left;  Excision Left Thigh Mass  . MASTECTOMY W/ SENTINEL NODE BIOPSY Left 06/04/2019   Procedure: LEFT MASTECTOMY WITH SENTINEL LYMPH NODE BIOPSY;  Surgeon: [name], MD;  Location: [place];  Service: General;  Laterality: Left;  Marland Kitchen MASTECTOMY, PARTIAL  2008   GOT PACEMAKER AND DEFIB AT THAT TIME  . PACEMAKER INSERTION  04/23/06  . TOTAL KNEE ARTHROPLASTY  05/17/01   RIGHT KNEE  . TOTAL KNEE ARTHROPLASTY Left 11/29/2014   Procedure: TOTAL LEFT KNEE ARTHROPLASTY;  Surgeon: Paralee Cancel, MD;  [facility];  Service: Orthopedics;  Laterality: Left;    There were no vitals filed for this visit.  Subjective Assessment - 08/05/19 1349    Subjective  Pt states she is feeling  better.    Pertinent History  left breast cancer with left mastectomy on 06/04/2019 with 5 lymph nodes . past history includes lumpectomy in 2008 with radiation and no lymph nodes removed she has chronic left breast pain and seroma with excisional biopsy in 2011.  Hx includes CHR with implanted defibrillator in left chest, bilateral TKR, osteoporosis    Patient Stated Goals  be able to lift my arm better to comb my hair  05/31/2019:  pt says she is able to do this now.    Currently in Pain?  No/denies                       [facility] Adult PT Treatment/Exercise - 08/05/19 0001      Exercises   Exercises  Shoulder      Shoulder Exercises: Supine   Protraction  AROM;Left    Diagonals  Strengthening;Left;10 reps   manual resistance      Shoulder Exercises: Sidelying   ABduction  AROM;Left    Other Sidelying  Exercises  small circles with hand pointed to ceiling.       Shoulder Exercises: Pulleys   Flexion  2 minutes      Shoulder Exercises: Therapy Ball   Flexion  Both;5 reps   ball up the wall      Manual Therapy   Manual Therapy  Edema management;Soft tissue mobilization;Myofascial release    Soft tissue mobilization  with pt in sidelying and with thick massage cream, soft tissue lifting with C and S strokes to mobilize soft tissue     Myofascial Release  to tight scar tissue at left lateral chest     Manual Lymphatic Drainage (MLD)  In supine: short neck, bil shoulder collectors, 5 diaphragmatic breaths, Rt axillary and L inguinal nodes, anterior inter-axillary anastomosis but modified to avoid pace maker, L axillo-inguinal anastomosis, lateral L brachium, medial to lateral L brachium, lateral L brachium, re-worked anastomosis (same as before), bottle neck, all surfaces of antebrachium, dorsum of the hand, re-worked all surfaces    Passive ROM  To Lt shoulder into flexion, abduction and D2 to pts tolerance               PT Short Term Goals - 06/30/19 1819      PT SHORT TERM GOAL #1   Title  Pt  will report pain in left shoulder is decreased to 6/10    Baseline  8/10 on 06/30/2019    Time  4    Status  New      PT SHORT TERM GOAL #2   Title  Pt wil be independent in a basic HEP for shoulder ROM    Time  4    Period  Weeks    Status  New        PT Long Term Goals - 08/03/19 1745      PT LONG TERM GOAL #1   Title  Pt will have reduction in left forearm at 15 cm proximla to ulnar styloid by 1 cm    Baseline  23.8 cm at baseline; 22.8 cm - 08/03/19    Status  Achieved      PT LONG TERM GOAL #2   Title  Pt and daugher will report they understand lymphedema risk reduction practices and how to manage left arm lymphedema at home    Baseline  Pt has been measured for compression garments - 08/03/19    Status  Partially Met  PT LONG TERM GOAL #3   Title  Pt will have 140  degrees of painfree left shoulder abduction so that she can easily perfom her ADLs at home    Baseline  115 and painful on 06/30/2019; 122 degrees - 08/03/19    Status  On-going      PT LONG TERM GOAL #4   Title  Patient will be Independent with HEP for shoulder ROM and strength    Status  Partially Met            Plan - 08/05/19 1354    Clinical Impression Statement  Pt continues to make slow improvement with better shoulder ROM, less tightness and less lymphedema in left arm . She feels improvement also    Comorbidities  previous triple negataive left breast cancer, seroma after lumpectomy, CHF with defibrillator, Bilateral TKR osteoporosis    Stability/Clinical Decision Making  Stable/Uncomplicated    Rehab Potential  Good    PT Frequency  2x / week    PT Duration  8 weeks    PT Treatment/Interventions  ADLs/Self Care Home Management;DME Instruction;Therapeutic activities;Patient/family education;Orthotic Fit/Training;Manual techniques;Manual lymph drainage;Compression bandaging;Scar mobilization;Taping;Passive range of motion    PT Next Visit Plan  focus on scar at lateral chest,  and cording in axilla cont MLD to left arm and gentle ROM being mindfull of defibrillator, progress exercise as pain decreases; manual techniques as needed for cording in left axilla    Consulted and Agree with Plan of Care  Patient       Patient will benefit from skilled therapeutic intervention in order to improve the following deficits and impairments:  Decreased activity tolerance, Decreased knowledge of precautions, Decreased knowledge of use of DME, Decreased range of motion, Decreased strength, Increased fascial restricitons, Increased edema, Impaired perceived functional ability, Increased muscle spasms, Impaired UE functional use, Postural dysfunction, Pain  Visit Diagnosis: Aftercare following surgery for neoplasm  Acute pain of left shoulder  Stiffness of left shoulder joint     Problem  List Patient Active Problem List   Diagnosis Date Noted  . Recurrent major depressive disorder, in partial remission ([place]) 07/17/2019  . Status post left mastectomy 07/01/2019  . Breast cancer of lower-outer quadrant of left female breast ([place]) 06/04/2019  . Candida infection, oral 01/15/2019  . Senile purpura ([place]) 04/14/2018  . Lumbar post-laminectomy syndrome 12/03/2017  . Lumbar spondylosis 12/03/2017  . History of back surgery 09/01/2017  . Constipation 09/01/2017  . Hypothyroidism due to acquired atrophy of thyroid 09/01/2017  . Mixed hyperlipidemia 09/01/2017  . Age-related osteoporosis without current pathological fracture 09/01/2017  . High risk medication use 09/01/2017  . Arthritis of hand 05/17/2017  . Atherosclerosis of native arteries of extremity with intermittent claudication ([place]) 05/15/2017  . Status post total bilateral knee replacement 12/20/2016  . Gait abnormality 07/16/2016  . Chronic low back pain 07/16/2016  . Mild cognitive impairment 07/16/2016  . Wrist pain 05/10/2015  . S/P left TKA 11/29/2014  . S/P knee replacement 11/29/2014  . Spontaneous bruising 08/27/2014  . Numbness and tingling in right hand 07/28/2014  . Essential tremor 07/28/2014  . Dizziness 05/28/2014  . Shaky 05/28/2014  . Memory loss 05/28/2014  . Depression 05/06/2014  . Lipoma of left upper thigh 3x5 cm 09/28/2011  . Cerebral vascular accident ([place]) 08/09/2011  . Syncope 08/09/2011  . History of breast cancer T1bNxMx, s/p BCT 2008, triple negative 01/26/2011  . Chronic L breast pain with chronic recurrent seroma, s/p excisional biopsy 01/12/2010 01/26/2011  .  Fainted   . ICD (implantable cardioverter-defibrillator), biventricular, in situ 08/02/2010  . Chronic systolic heart failure ([place]) 08/02/2010  . Hypertension 08/02/2010   Donato Heinz. Owens Shark PT  Norwood Levo 08/05/2019, 1:56 PM  [place], [place], [address] Phone: [phone]   Fax:  [phone]  Name: Megha Agnes MRN: [id number] Date of Birth: 02-16-1934

## 2019-08-06 ENCOUNTER — Ambulatory Visit (INDEPENDENT_AMBULATORY_CARE_PROVIDER_SITE_OTHER): Payer: Medicare Other | Admitting: *Deleted

## 2019-08-06 DIAGNOSIS — I5022 Chronic systolic (congestive) heart failure: Secondary | ICD-10-CM | POA: Diagnosis not present

## 2019-08-06 LAB — CUP PACEART REMOTE DEVICE CHECK
Battery Remaining Longevity: 42 mo
Battery Remaining Percentage: 71 %
Brady Statistic RA Percent Paced: 0 %
Brady Statistic RV Percent Paced: 100 %
Date Time Interrogation Session: 20210429093500
HighPow Impedance: 49 Ohm
Implantable Lead Implant Date: 20080116
Implantable Lead Implant Date: 20080116
Implantable Lead Implant Date: 20080116
Implantable Lead Location: 753859
Implantable Lead Location: 753860
Implantable Lead Location: 753860
Implantable Lead Model: 157
Implantable Lead Model: 4469
Implantable Lead Model: 4555
Implantable Lead Serial Number: 136532
Implantable Lead Serial Number: 161542
Implantable Lead Serial Number: 473495
Implantable Pulse Generator Implant Date: 20140911
Lead Channel Impedance Value: 411 Ohm
Lead Channel Impedance Value: 558 Ohm
Lead Channel Impedance Value: 885 Ohm
Lead Channel Pacing Threshold Amplitude: 0.6 V
Lead Channel Pacing Threshold Amplitude: 0.7 V
Lead Channel Pacing Threshold Amplitude: 0.8 V
Lead Channel Pacing Threshold Pulse Width: 0.4 ms
Lead Channel Pacing Threshold Pulse Width: 0.4 ms
Lead Channel Pacing Threshold Pulse Width: 0.8 ms
Lead Channel Setting Pacing Amplitude: 2 V
Lead Channel Setting Pacing Amplitude: 2 V
Lead Channel Setting Pacing Amplitude: 2.4 V
Lead Channel Setting Pacing Pulse Width: 0.4 ms
Lead Channel Setting Pacing Pulse Width: 0.8 ms
Lead Channel Setting Sensing Sensitivity: 0.6 mV
Lead Channel Setting Sensing Sensitivity: 1 mV
Pulse Gen Serial Number: 111765

## 2019-08-07 NOTE — Progress Notes (Signed)
ICD Remote  

## 2019-08-10 ENCOUNTER — Telehealth: Payer: Self-pay | Admitting: *Deleted

## 2019-08-10 NOTE — Telephone Encounter (Signed)
Patient stated that her symptoms are/were about the same. Patient stated that she wants to STOP the medication and see if her symptoms resolve. Please Advise.

## 2019-08-10 NOTE — Telephone Encounter (Signed)
Generally dizziness is associated more with doses over 5 mg daily, however did she notice that her dizziness got worse when she started on this medication? If so it could be related

## 2019-08-10 NOTE — Telephone Encounter (Signed)
Lets have her decrease norvasc to 2.5 mg (so can take half tablet) and to monitor blood pressure, her blood pressure was on the high side without it so I do not want to stop medication all together, lets have her follow up in office in 2 weeks with blood pressure readings.

## 2019-08-10 NOTE — Telephone Encounter (Signed)
Patient called and stated that she went to pick up her Amlodipine refill and the pharmacist told her that the medication may cause Dizziness. Patient stated that she has been suffering with Dizziness and wonders why she is taking a medication that may cause it. Wants to know if this could be where her dizziness is coming from and why does she need to take it.  Please Advise.

## 2019-08-11 NOTE — Telephone Encounter (Signed)
Patient notified and agreed.  Appointment scheduled for 08/24/19 with Janett Billow.

## 2019-08-18 ENCOUNTER — Encounter: Payer: Self-pay | Admitting: Physical Therapy

## 2019-08-18 ENCOUNTER — Other Ambulatory Visit: Payer: Self-pay

## 2019-08-18 ENCOUNTER — Ambulatory Visit: Payer: Medicare Other | Attending: Hematology | Admitting: Physical Therapy

## 2019-08-18 DIAGNOSIS — M25512 Pain in left shoulder: Secondary | ICD-10-CM | POA: Diagnosis not present

## 2019-08-18 DIAGNOSIS — Z483 Aftercare following surgery for neoplasm: Secondary | ICD-10-CM | POA: Insufficient documentation

## 2019-08-18 DIAGNOSIS — I972 Postmastectomy lymphedema syndrome: Secondary | ICD-10-CM | POA: Diagnosis present

## 2019-08-18 DIAGNOSIS — M25612 Stiffness of left shoulder, not elsewhere classified: Secondary | ICD-10-CM | POA: Diagnosis present

## 2019-08-18 NOTE — Therapy (Signed)
[place], [place], [address] Phone: [phone]   Fax:  [phone]  Physical Therapy Treatment  Patient Details  Name: [name] MRN: [id number] Date of Birth: [birth year] Referring Provider (PT): Dr. Irene Limbo  (Dr Barry Dienes is surgeon)   Encounter Date: 08/18/2019  PT End of Session - 08/18/19 1756    Visit Number  12    Number of Visits  17    Date for PT Re-Evaluation  08/31/19    PT Start Time  1400    PT Stop Time  1445    PT Time Calculation (min)  45 min    Activity Tolerance  Patient tolerated treatment well    Behavior During Therapy  [facility] for tasks assessed/performed       Past Medical History:  Diagnosis Date  . Arthritis   . Cancer [facility])    left breast cancer   . Cataracts, bilateral    removed by surgery  . CHF (congestive heart failure) ([place])    PACEMAKER & DEFIB  . Complication of anesthesia    hypotensive after back surgery in 2006  . Depression   . Dyslipidemia   . Fainted 04/21/06   AT [place]  . GERD (gastroesophageal reflux disease)   . Headache(784.0)   . Hearing loss    bilateral hearing aids  . HLD (hyperlipidemia)    diet controlled   . Hypertension   . Hypothyroidism   . ICD (implantable cardiac defibrillator) in place    pt has pacer/icd  . ICD (implantable cardiac defibrillator), biventricular, in situ   . LBBB (left bundle branch block)   . Memory loss   . Nonischemic cardiomyopathy ([place])   . Normal coronary arteries    s/p cardiac cath 2007  . Pacemaker    ICD [employer]  . Syncope   . Systolic CHF ([place])   . Vertigo   . Wears glasses     Past Surgical History:  Procedure Laterality Date  . BACK SURGERY     lumbar fusion   . BREAST LUMPECTOMY Left 2008  . BREAST SURGERY  2000   LUMP REMOVAL. STAGE 1 CANCER  . CARDIAC CATHETERIZATION    . CATARACT EXTRACTION    . COLONOSCOPY    . EYE SURGERY    . IMPLANTABLE CARDIOVERTER  DEFIBRILLATOR GENERATOR CHANGE N/A 12/18/2012   Procedure: IMPLANTABLE CARDIOVERTER DEFIBRILLATOR GENERATOR CHANGE;  Surgeon: [name], MD;  Location: [facility] CATH LAB;  Service: Cardiovascular;  Laterality: N/A;  . JOINT REPLACEMENT  06/14/01   right  . LUMBAR FUSION  2006  . MASS EXCISION  11/08/2011   Procedure: EXCISION MASS;  Surgeon: [name], MD;  [facility];  Service: General;  Laterality: Left;  Excision Left Thigh Mass  . MASTECTOMY W/ SENTINEL NODE BIOPSY Left 06/04/2019   Procedure: LEFT MASTECTOMY WITH SENTINEL LYMPH NODE BIOPSY;  Surgeon: [name], MD;  Location: [place];  Service: General;  Laterality: Left;  Marland Kitchen MASTECTOMY, PARTIAL  2008   GOT PACEMAKER AND DEFIB AT THAT TIME  . PACEMAKER INSERTION  04/23/06  . TOTAL KNEE ARTHROPLASTY  05/17/01   RIGHT KNEE  . TOTAL KNEE ARTHROPLASTY Left 11/29/2014   Procedure: TOTAL LEFT KNEE ARTHROPLASTY;  Surgeon: Paralee Cancel, MD;  [facility];  Service: Orthopedics;  Laterality: Left;    There were no vitals filed for this visit.  Subjective Assessment - 08/18/19 1405    Subjective  Pt says she got her  velcro sleeve and glove .  She forgot to bring them into to assess for fit.  She says she has been wearing the sleeve and glove but her hand was more swollen whe she took the glove off( not sure why?)    Pertinent History  left breast cancer with left mastectomy on 06/04/2019 with 5 lymph nodes . past history includes lumpectomy in 2008 with radiation and no lymph nodes removed she has chronic left breast pain and seroma with excisional biopsy in 2011.  Hx includes CHR with implanted defibrillator in left chest, bilateral TKR, osteoporosis    Patient Stated Goals  be able to lift my arm better to comb my hair  05/31/2019:  pt says she is able to do this now.    Currently in Pain?  No/denies            LYMPHEDEMA/ONCOLOGY QUESTIONNAIRE - 08/18/19 1410      Right Upper Extremity Lymphedema   10 cm Proximal to Olecranon  Process  24 cm    Olecranon Process  22 cm    15 cm Proximal to Ulnar Styloid Process  21 cm    10 cm Proximal to Ulnar Styloid Process  17.5 cm    Just Proximal to Ulnar Styloid Process  13.8 cm    Across Hand at [employer]  16.5 cm    At [place] of 2nd Digit  5.5 cm      Left Upper Extremity Lymphedema   10 cm Proximal to Olecranon Process  26.2 cm    Olecranon Process  22 cm    15 cm Proximal to Ulnar Styloid Process  21.8 cm    10 cm Proximal to Ulnar Styloid Process  18.6 cm    Just Proximal to Ulnar Styloid Process  15.4 cm    Across Hand at [employer]  17 cm    At [place] of 2nd Digit  1 cm                [facility] Adult PT Treatment/Exercise - 08/18/19 0001      Manual Therapy   Manual Therapy  Edema management    Edema Management  remeasured arm today     Soft tissue mobilization  with pt in sidelying and with thick massage cream, soft tissue lifting with C and S strokes to mobilize soft tissue     Myofascial Release  to tight scar tissue at left lateral chest     Manual Lymphatic Drainage (MLD)  In supine: short neck, bil shoulder collectors, 5 diaphragmatic breaths, Rt axillary and L inguinal nodes, anterior inter-axillary anastomosis but modified to avoid pace maker, L axillo-inguinal anastomosis, lateral L brachium, medial to lateral L brachium, lateral L brachium, re-worked anastomosis (same as before), bottle neck, all surfaces of antebrachium, dorsum of the hand, re-worked all surfaces               PT Short Term Goals - 06/30/19 1819      PT SHORT TERM GOAL #1   Title  Pt  will report pain in left shoulder is decreased to 6/10    Baseline  8/10 on 06/30/2019    Time  4    Status  New      PT SHORT TERM GOAL #2   Title  Pt wil be independent in a basic HEP for shoulder ROM    Time  4    Period  Weeks    Status  New  PT Long Term Goals - 08/03/19 1745      PT LONG TERM GOAL #1   Title  Pt will have reduction in left forearm at 15  cm proximla to ulnar styloid by 1 cm    Baseline  23.8 cm at baseline; 22.8 cm - 08/03/19    Status  Achieved      PT LONG TERM GOAL #2   Title  Pt and daugher will report they understand lymphedema risk reduction practices and how to manage left arm lymphedema at home    Baseline  Pt has been measured for compression garments - 08/03/19    Status  Partially Met      PT LONG TERM GOAL #3   Title  Pt will have 140 degrees of painfree left shoulder abduction so that she can easily perfom her ADLs at home    Baseline  115 and painful on 06/30/2019; 122 degrees - 08/03/19    Status  On-going      PT LONG TERM GOAL #4   Title  Patient will be Independent with HEP for shoulder ROM and strength    Status  Partially Met            Plan - 08/18/19 1419    Clinical Impression Statement  Pt has had good reduction in her left arm with use of compression at home intermittently and self MLD.  She still has fullness in her left arm and tightness around her left chest She will benefit from continued PT to continue with progress    Personal Factors and Comorbidities  Age    Comorbidities  previous triple negataive left breast cancer, seroma after lumpectomy, CHF with defibrillator, Bilateral TKR osteoporosis    PT Frequency  2x / week    PT Duration  8 weeks    PT Treatment/Interventions  ADLs/Self Care Home Management;DME Instruction;Therapeutic activities;Patient/family education;Orthotic Fit/Training;Manual techniques;Manual lymph drainage;Compression bandaging;Scar mobilization;Taping;Passive range of motion    PT Next Visit Plan  check fit of velcro garment and glove if she brings them in focus on scar at lateral chest,  and cording in axilla cont MLD to left arm and gentle ROM being mindfull of defibrillator, progress exercise as pain decreases; manual techniques as needed for cording in left axilla    PT Home Exercise Plan  Supine dowel exercises; supine scapular series; doorway stretch for  abduction    Consulted and Agree with Plan of Care  Patient       Patient will benefit from skilled therapeutic intervention in order to improve the following deficits and impairments:  Decreased activity tolerance, Decreased knowledge of precautions, Decreased knowledge of use of DME, Decreased range of motion, Decreased strength, Increased fascial restricitons, Increased edema, Impaired perceived functional ability, Increased muscle spasms, Impaired UE functional use, Postural dysfunction, Pain  Visit Diagnosis: Acute pain of left shoulder  Aftercare following surgery for neoplasm  Stiffness of left shoulder joint  Postmastectomy lymphedema     Problem List Patient Active Problem List   Diagnosis Date Noted  . Recurrent major depressive disorder, in partial remission ([place]) 07/17/2019  . Status post left mastectomy 07/01/2019  . Breast cancer of lower-outer quadrant of left female breast ([place]) 06/04/2019  . Candida infection, oral 01/15/2019  . Senile purpura ([place]) 04/14/2018  . Lumbar post-laminectomy syndrome 12/03/2017  . Lumbar spondylosis 12/03/2017  . History of back surgery 09/01/2017  . Constipation 09/01/2017  . Hypothyroidism due to acquired atrophy of thyroid 09/01/2017  . Mixed hyperlipidemia 09/01/2017  .  Age-related osteoporosis without current pathological fracture 09/01/2017  . High risk medication use 09/01/2017  . Arthritis of hand 05/17/2017  . Atherosclerosis of native arteries of extremity with intermittent claudication ([place]) 05/15/2017  . Status post total bilateral knee replacement 12/20/2016  . Gait abnormality 07/16/2016  . Chronic low back pain 07/16/2016  . Mild cognitive impairment 07/16/2016  . Wrist pain 05/10/2015  . S/P left TKA 11/29/2014  . S/P knee replacement 11/29/2014  . Spontaneous bruising 08/27/2014  . Numbness and tingling in right hand 07/28/2014  . Essential tremor 07/28/2014  . Dizziness 05/28/2014  . Shaky 05/28/2014  .  Memory loss 05/28/2014  . Depression 05/06/2014  . Lipoma of left upper thigh 3x5 cm 09/28/2011  . Cerebral vascular accident ([place]) 08/09/2011  . Syncope 08/09/2011  . History of breast cancer T1bNxMx, s/p BCT 2008, triple negative 01/26/2011  . Chronic L breast pain with chronic recurrent seroma, s/p excisional biopsy 01/12/2010 01/26/2011  . Fainted   . ICD (implantable cardioverter-defibrillator), biventricular, in situ 08/02/2010  . Chronic systolic heart failure ([place]) 08/02/2010  . Hypertension 08/02/2010   Donato Heinz. Owens Shark PT  Norwood Levo 08/18/2019, 5:59 PM  [place], [place], [address] Phone: [phone]   Fax:  [phone]  Name: [name] MRN: [id number] Date of Birth: December 06, 1933

## 2019-08-18 NOTE — Progress Notes (Signed)
I have reviewed and agreed above plan. 

## 2019-08-20 ENCOUNTER — Ambulatory Visit: Payer: Medicare Other

## 2019-08-20 ENCOUNTER — Other Ambulatory Visit: Payer: Self-pay

## 2019-08-20 DIAGNOSIS — I972 Postmastectomy lymphedema syndrome: Secondary | ICD-10-CM

## 2019-08-20 DIAGNOSIS — Z483 Aftercare following surgery for neoplasm: Secondary | ICD-10-CM

## 2019-08-20 DIAGNOSIS — M25512 Pain in left shoulder: Secondary | ICD-10-CM | POA: Diagnosis not present

## 2019-08-20 DIAGNOSIS — M25612 Stiffness of left shoulder, not elsewhere classified: Secondary | ICD-10-CM

## 2019-08-20 NOTE — Therapy (Signed)
[place], [place], [address] Phone: [phone]   Fax:  [phone]  Physical Therapy Treatment  Patient Details  Name: [name] MRN: [id number] Date of Birth: Jul 13, 1933 Referring Provider (PT): Dr. Irene Limbo  (Dr Barry Dienes is surgeon)   Encounter Date: 08/20/2019  PT End of Session - 08/20/19 1330    Visit Number  13    Number of Visits  17    Date for PT Re-Evaluation  08/31/19    PT Start Time  1202    PT Stop Time  1320    PT Time Calculation (min)  78 min    Activity Tolerance  Patient tolerated treatment well    Behavior During Therapy  [facility] for tasks assessed/performed       Past Medical History:  Diagnosis Date  . Arthritis   . Cancer [facility])    left breast cancer   . Cataracts, bilateral    removed by surgery  . CHF (congestive heart failure) ([place])    PACEMAKER & DEFIB  . Complication of anesthesia    hypotensive after back surgery in 2006  . Depression   . Dyslipidemia   . Fainted 04/21/06   AT [place]  . GERD (gastroesophageal reflux disease)   . Headache(784.0)   . Hearing loss    bilateral hearing aids  . HLD (hyperlipidemia)    diet controlled   . Hypertension   . Hypothyroidism   . ICD (implantable cardiac defibrillator) in place    pt has pacer/icd  . ICD (implantable cardiac defibrillator), biventricular, in situ   . LBBB (left bundle branch block)   . Memory loss   . Nonischemic cardiomyopathy ([place])   . Normal coronary arteries    s/p cardiac cath 2007  . Pacemaker    ICD [employer]  . Syncope   . Systolic CHF ([place])   . Vertigo   . Wears glasses     Past Surgical History:  Procedure Laterality Date  . BACK SURGERY     lumbar fusion   . BREAST LUMPECTOMY Left 2008  . BREAST SURGERY  2000   LUMP REMOVAL. STAGE 1 CANCER  . CARDIAC CATHETERIZATION    . CATARACT EXTRACTION    . COLONOSCOPY    . EYE SURGERY    . IMPLANTABLE CARDIOVERTER  DEFIBRILLATOR GENERATOR CHANGE N/A 12/18/2012   Procedure: IMPLANTABLE CARDIOVERTER DEFIBRILLATOR GENERATOR CHANGE;  Surgeon: [name], MD;  Location: [facility] CATH LAB;  Service: Cardiovascular;  Laterality: N/A;  . JOINT REPLACEMENT  06/14/01   right  . LUMBAR FUSION  2006  . MASS EXCISION  11/08/2011   Procedure: EXCISION MASS;  Surgeon: [name], MD;  [facility];  Service: General;  Laterality: Left;  Excision Left Thigh Mass  . MASTECTOMY W/ SENTINEL NODE BIOPSY Left 06/04/2019   Procedure: LEFT MASTECTOMY WITH SENTINEL LYMPH NODE BIOPSY;  Surgeon: [name], MD;  Location: [place];  Service: General;  Laterality: Left;  Marland Kitchen MASTECTOMY, PARTIAL  2008   GOT PACEMAKER AND DEFIB AT THAT TIME  . PACEMAKER INSERTION  04/23/06  . TOTAL KNEE ARTHROPLASTY  05/17/01   RIGHT KNEE  . TOTAL KNEE ARTHROPLASTY Left 11/29/2014   Procedure: TOTAL LEFT KNEE ARTHROPLASTY;  Surgeon: Paralee Cancel, MD;  [facility];  Service: Orthopedics;  Laterality: Left;    There were no vitals filed for this visit.  Subjective Assessment - 08/20/19 1231    Subjective  Pt has continued to wear  velcro compression garment with her glove intermittently as she reports it uncomfortable. And after a short time of wearing the velcro garment she has an indentation up her inner arm and pain with some bruising at her anterior elbow (though no bruising visible today).    Pertinent History  left breast cancer with left mastectomy on 06/04/2019 with 5 lymph nodes . past history includes lumpectomy in 2008 with radiation and no lymph nodes removed she has chronic left breast pain and seroma with excisional biopsy in 2011.  Hx includes CHR with implanted defibrillator in left chest, bilateral TKR, osteoporosis    Patient Stated Goals  be able to lift my arm better to comb my hair  05/31/2019:  pt says she is able to do this now.    Currently in Pain?  No/denies            LYMPHEDEMA/ONCOLOGY QUESTIONNAIRE - 08/20/19 1233       Left Upper Extremity Lymphedema   10 cm Proximal to Olecranon Process  25.5 cm    Olecranon Process  22.1 cm    15 cm Proximal to Ulnar Styloid Process  22 cm    10 cm Proximal to Ulnar Styloid Process  18.9 cm    Just Proximal to Ulnar Styloid Process  15.1 cm    Across Hand at [employer]  16.8 cm    At [place] of 2nd Digit  5.9 cm                 [facility] Adult PT Treatment/Exercise - 08/20/19 0001      Manual Therapy   Edema Management  Fashioned temporary compression for pt to wear out of long TG soft small with 1/4" x2 gray foam at anterior and posterior forearm, with TG doubled over ("muff" type) for pt to try wearing in the meantime until we can figure out better velcro or similar garment since her Wallie Char is leaving indentations and is uncomfortable at antecubital fossa, instructed daughter on this after session as well    Myofascial Release  to tight scar tissue at left lateral chest, focused on inferior to mastectomy incision with trunk rotation to Rt; and then at Lt axilla and antecubital fossa    Manual Lymphatic Drainage (MLD)  In supine: short neck, bil shoulder collectors, 5 diaphragmatic breaths, Rt axillary and L inguinal nodes, anterior inter-axillary anastomosis but modified to avoid pace maker, L axillo-inguinal anastomosis, lateral L brachium, medial to lateral L brachium, lateral L brachium, re-worked anastomosis (same as before), bottle neck, all surfaces of antebrachium, dorsum of the hand, re-worked all surfaces    Passive ROM  To Lt shoulder into flexion, abduction and D2 to pts tolerance               PT Short Term Goals - 06/30/19 1819      PT SHORT TERM GOAL #1   Title  Pt  will report pain in left shoulder is decreased to 6/10    Baseline  8/10 on 06/30/2019    Time  4    Status  New      PT SHORT TERM GOAL #2   Title  Pt wil be independent in a basic HEP for shoulder ROM    Time  4    Period  Weeks    Status  New        PT Long  Term Goals - 08/03/19 1745      PT LONG TERM GOAL #1  Title  Pt will have reduction in left forearm at 15 cm proximla to ulnar styloid by 1 cm    Baseline  23.8 cm at baseline; 22.8 cm - 08/03/19    Status  Achieved      PT LONG TERM GOAL #2   Title  Pt and daugher will report they understand lymphedema risk reduction practices and how to manage left arm lymphedema at home    Baseline  Pt has been measured for compression garments - 08/03/19    Status  Partially Met      PT LONG TERM GOAL #3   Title  Pt will have 140 degrees of painfree left shoulder abduction so that she can easily perfom her ADLs at home    Baseline  115 and painful on 06/30/2019; 122 degrees - 08/03/19    Status  On-going      PT LONG TERM GOAL #4   Title  Patient will be Independent with HEP for shoulder ROM and strength    Status  Partially Met            Plan - 08/20/19 1333    Clinical Impression Statement  Pts circumference measurements had reduced since las tsession due to wear of new velcro compression garment and glove though pt reports glove very uncomfortable and wears intermittently, especially at anterior elbow. Upon inspection of this at clinic when donning sleeve (pt was not wearing when she came) and then donning only ~3 mins later, she had mod indentations along seam of garment at inner arm, length of arm. Also visible protrusion of medial vein at anterior elbow where pt c/o most tenderness. See flowsheet for temporary compression therapist made today for pt to wear in meantime as I reached out to the fitter today to see about trying a different compression option for pt. Instructed pt to try wearing her glove with this sleeve as well to see if glove will be more comfortable with less compression on arm. Maybe velcro is too much?? Instructed daughter on same discussed during session with pt today with pts permission. Her P/ROM is much improved since this therapist saw her last, along with decrease in  cording, though still very much present. Continued with myofascial release to Lt upper quadrant as well to tolerance.    Personal Factors and Comorbidities  Age    Comorbidities  previous triple negataive left breast cancer, seroma after lumpectomy, CHF with defibrillator, Bilateral TKR osteoporosis    Examination-Activity Limitations  Reach Overhead;Bathing    Stability/Clinical Decision Making  Stable/Uncomplicated    Rehab Potential  Good    PT Frequency  2x / week    PT Duration  8 weeks    PT Treatment/Interventions  ADLs/Self Care Home Management;DME Instruction;Therapeutic activities;Patient/family education;Orthotic Fit/Training;Manual techniques;Manual lymph drainage;Compression bandaging;Scar mobilization;Taping;Passive range of motion    PT Next Visit Plan  Reassess fit of velcro if pt has been wearing TG soft with foam issued today and glove (is it more tolerable with less compression on arm?); focus on scar at lateral chest,  and cording in axilla cont MLD to left arm and gentle ROM being mindfull of defibrillator, progress exercise as pain decreases; manual techniques as needed for cording in left axilla    PT Home Exercise Plan  Supine dowel exercises; supine scapular series; doorway stretch for abduction    Consulted and Agree with Plan of Care  Patient       Patient will benefit from skilled therapeutic intervention in order to  improve the following deficits and impairments:  Decreased activity tolerance, Decreased knowledge of precautions, Decreased knowledge of use of DME, Decreased range of motion, Decreased strength, Increased fascial restricitons, Increased edema, Impaired perceived functional ability, Increased muscle spasms, Impaired UE functional use, Postural dysfunction, Pain  Visit Diagnosis: Acute pain of left shoulder  Aftercare following surgery for neoplasm  Stiffness of left shoulder joint  Postmastectomy lymphedema     Problem List Patient Active  Problem List   Diagnosis Date Noted  . Recurrent major depressive disorder, in partial remission ([place]) 07/17/2019  . Status post left mastectomy 07/01/2019  . Breast cancer of lower-outer quadrant of left female breast ([place]) 06/04/2019  . Candida infection, oral 01/15/2019  . Senile purpura ([place]) 04/14/2018  . Lumbar post-laminectomy syndrome 12/03/2017  . Lumbar spondylosis 12/03/2017  . History of back surgery 09/01/2017  . Constipation 09/01/2017  . Hypothyroidism due to acquired atrophy of thyroid 09/01/2017  . Mixed hyperlipidemia 09/01/2017  . Age-related osteoporosis without current pathological fracture 09/01/2017  . High risk medication use 09/01/2017  . Arthritis of hand 05/17/2017  . Atherosclerosis of native arteries of extremity with intermittent claudication ([place]) 05/15/2017  . Status post total bilateral knee replacement 12/20/2016  . Gait abnormality 07/16/2016  . Chronic low back pain 07/16/2016  . Mild cognitive impairment 07/16/2016  . Wrist pain 05/10/2015  . S/P left TKA 11/29/2014  . S/P knee replacement 11/29/2014  . Spontaneous bruising 08/27/2014  . Numbness and tingling in right hand 07/28/2014  . Essential tremor 07/28/2014  . Dizziness 05/28/2014  . Shaky 05/28/2014  . Memory loss 05/28/2014  . Depression 05/06/2014  . Lipoma of left upper thigh 3x5 cm 09/28/2011  . Cerebral vascular accident ([place]) 08/09/2011  . Syncope 08/09/2011  . History of breast cancer T1bNxMx, s/p BCT 2008, triple negative 01/26/2011  . Chronic L breast pain with chronic recurrent seroma, s/p excisional biopsy 01/12/2010 01/26/2011  . Fainted   . ICD (implantable cardioverter-defibrillator), biventricular, in situ 08/02/2010  . Chronic systolic heart failure ([place]) 08/02/2010  . Hypertension 08/02/2010    Otelia Limes, PTA 08/20/2019, 1:40 PM  [place], [place], [address] Phone:  [phone]   Fax:  [phone]  Name: Apple Dearmas MRN: [id number] Date of Birth: [birth year]

## 2019-08-24 ENCOUNTER — Ambulatory Visit (INDEPENDENT_AMBULATORY_CARE_PROVIDER_SITE_OTHER): Payer: Medicare Other | Admitting: Nurse Practitioner

## 2019-08-24 ENCOUNTER — Encounter: Payer: Self-pay | Admitting: Nurse Practitioner

## 2019-08-24 ENCOUNTER — Other Ambulatory Visit: Payer: Self-pay

## 2019-08-24 VITALS — BP 142/80 | HR 68 | Temp 96.9°F | Ht 60.0 in | Wt 120.0 lb

## 2019-08-24 DIAGNOSIS — J302 Other seasonal allergic rhinitis: Secondary | ICD-10-CM | POA: Diagnosis not present

## 2019-08-24 DIAGNOSIS — R413 Other amnesia: Secondary | ICD-10-CM

## 2019-08-24 DIAGNOSIS — I10 Essential (primary) hypertension: Secondary | ICD-10-CM

## 2019-08-24 DIAGNOSIS — R42 Dizziness and giddiness: Secondary | ICD-10-CM | POA: Diagnosis not present

## 2019-08-24 NOTE — Progress Notes (Signed)
Careteam: Patient Care Team: [name], NP as PCP - General (Geriatric Medicine) [name], MD as Consulting Physician (General Surgery) Paralee Cancel, MD as Consulting Physician (Orthopedic Surgery) Marica Otter, [place] (Optometry) Marcial Pacas, MD as Consulting Physician (Neurology) [name], MD as Consulting Physician (Cardiology) Ricard Dillon, MD (Psychiatry)  PLACE OF SERVICE:  [place]  Advanced Directive information    Allergies  Allergen Reactions  . Iodine Shortness Of Breath    Iodine contrast, CHF , SOB  . Shellfish Allergy Shortness Of Breath  . Memantine     Malaise, fogginess/ couldn't think  . Aspirin Nausea And Vomiting  . Codeine Nausea And Vomiting    Chief Complaint  Patient presents with  . Follow-up    Blood pressure follow-up. Here with daughter Malachy Mood  . Dizziness    Ongoing dizziness  . Results    Review recent CT Scan      HPI: Patient is a 86 y.o. female for follow up on blood pressure. Blood pressure ranging from 129-149/74-86 sbp mostly over 140.   She called when she was alerted that the norvasc could cause dizziness, she reduce norvasc to 2.5 mg but did not see any improvement in dizziness. dizzines has been ongoing since March (prior to starting norvasc), prior to that was her usual vertigo that meclizine would help after about 1 week.   Hyperlipidemia- making changes in diet. Drinks a pint of milk a day. Drinks milk to get her calcium intake.  Memory loss- reviewed CT, could not tolerate memory medication.   Review of Systems:  Review of Systems  Constitutional: Negative for chills, fever and weight loss.  HENT: Negative for tinnitus.   Respiratory: Negative for cough, sputum production and shortness of breath.   Cardiovascular: Negative for chest pain, palpitations and leg swelling.  Gastrointestinal: Negative for abdominal pain, constipation, diarrhea and heartburn.  Genitourinary: Negative for  dysuria, frequency and urgency.  Musculoskeletal: Negative for back pain, falls, joint pain and myalgias.  Skin: Negative.   Neurological: Positive for dizziness. Negative for headaches.  Psychiatric/Behavioral: Positive for memory loss. Negative for depression. The patient does not have insomnia.     Past Medical History:  Diagnosis Date  . Arthritis   . Cancer [facility])    left breast cancer   . Cataracts, bilateral    removed by surgery  . CHF (congestive heart failure) ([place])    PACEMAKER & DEFIB  . Complication of anesthesia    hypotensive after back surgery in 2006  . Depression   . Dyslipidemia   . Fainted 04/21/06   AT [place]  . GERD (gastroesophageal reflux disease)   . Headache(784.0)   . Hearing loss    bilateral hearing aids  . HLD (hyperlipidemia)    diet controlled   . Hypertension   . Hypothyroidism   . ICD (implantable cardiac defibrillator) in place    pt has pacer/icd  . ICD (implantable cardiac defibrillator), biventricular, in situ   . LBBB (left bundle branch block)   . Memory loss   . Nonischemic cardiomyopathy ([place])   . Normal coronary arteries    s/p cardiac cath 2007  . Pacemaker    ICD [employer]  . Syncope   . Systolic CHF ([place])   . Vertigo   . Wears glasses    Past Surgical History:  Procedure Laterality Date  . BACK SURGERY     lumbar fusion   . BREAST LUMPECTOMY Left 2008  .  BREAST SURGERY  2000   LUMP REMOVAL. STAGE 1 CANCER  . CARDIAC CATHETERIZATION    . CATARACT EXTRACTION    . COLONOSCOPY    . EYE SURGERY    . IMPLANTABLE CARDIOVERTER DEFIBRILLATOR GENERATOR CHANGE N/A 12/18/2012   Procedure: IMPLANTABLE CARDIOVERTER DEFIBRILLATOR GENERATOR CHANGE;  Surgeon: [name], MD;  Location: [facility] CATH LAB;  Service: Cardiovascular;  Laterality: N/A;  . JOINT REPLACEMENT  06/14/01   right  . LUMBAR FUSION  2006  . MASS EXCISION  11/08/2011   Procedure: EXCISION MASS;  Surgeon: [name], MD;  [facility];  Service:  General;  Laterality: Left;  Excision Left Thigh Mass  . MASTECTOMY W/ SENTINEL NODE BIOPSY Left 06/04/2019   Procedure: LEFT MASTECTOMY WITH SENTINEL LYMPH NODE BIOPSY;  Surgeon: [name], MD;  Location: [facility];  Service: General;  Laterality: Left;  Marland Kitchen MASTECTOMY, PARTIAL  2008   GOT PACEMAKER AND DEFIB AT THAT TIME  . PACEMAKER INSERTION  04/23/06  . TOTAL KNEE ARTHROPLASTY  05/17/01   RIGHT KNEE  . TOTAL KNEE ARTHROPLASTY Left 11/29/2014   Procedure: TOTAL LEFT KNEE ARTHROPLASTY;  Surgeon: Paralee Cancel, MD;  [facility];  Service: Orthopedics;  Laterality: Left;   Social History:   reports that she has never smoked. She has never used smokeless tobacco. She reports that she does not drink alcohol or use drugs.  Family History  Problem Relation Age of Onset  . Hypertension Mother   . Arthritis Mother   . Hypertension Father   . Hypertension Brother   . Hypertension Brother     Medications: Patient's Medications  New Prescriptions   No medications on file  Previous Medications   ACETAMINOPHEN (TYLENOL) 650 MG CR TABLET    Take 650 mg by mouth every 8 (eight) hours as needed for pain.   AMLODIPINE (NORVASC) 5 MG TABLET    TAKE 1 TABLET BY MOUTH DAILY.   ASCORBIC ACID (VITAMIN C) 500 MG TABLET    Take 500 mg by mouth daily.   BUPROPION (WELLBUTRIN XL) 150 MG 24 HR TABLET    Take 3 tablets (450 mg total) by mouth daily.   CARVEDILOL (COREG) 3.125 MG TABLET    TAKE 1 TABLET BY MOUTH  TWICE DAILY   CHOLECALCIFEROL (VITAMIN D3) 50 MCG (2000 UT) TABS    Take 2,000 Units by mouth daily.    COD LIVER OIL CAPS    Take 1 capsule by mouth daily. 415 mg   FUROSEMIDE (LASIX) 20 MG TABLET    TAKE 1 TABLET BY MOUTH  DAILY   LEVOTHYROXINE (SYNTHROID, LEVOTHROID) 100 MCG TABLET    Take 1 tablet (100 mcg total) by mouth daily before breakfast.   MAGNESIUM 250 MG TABS    Take 250 mg by mouth daily.   MECLIZINE (ANTIVERT) 12.5 MG TABLET    Take 1 tablet (12.5 mg total) by mouth 3 (three) times  daily as needed for dizziness.   METHOCARBAMOL (ROBAXIN) 500 MG TABLET    Take 1 tablet (500 mg total) by mouth every 8 (eight) hours as needed for muscle spasms.   NONFORMULARY OR COMPOUNDED ITEM    Diclofenac-Baclofen 2% [id number] 1-2 grams topically four times daily. Before application of cream rub 2 minutes to active max penetration. Quantity 120 grams   OMEPRAZOLE (PRILOSEC) 40 MG CAPSULE    TAKE 1 CAPSULE BY MOUTH  ONCE DAILY FOR STOMACH   OXCARBAZEPINE (TRILEPTAL) 300 MG TABLET    Take 300 mg by  mouth daily.    POTASSIUM CHLORIDE (KLOR-CON) 10 MEQ TABLET    TAKE 1 TABLET BY MOUTH  DAILY   SERTRALINE (ZOLOFT) 100 MG TABLET    Take 200 mg by mouth daily.    SPIRONOLACTONE (ALDACTONE) 25 MG TABLET    TAKE 1 TABLET BY MOUTH IN  THE MORNING   VITAMIN E 400 UNITS TABS    Take 400 Units by mouth daily.   Modified Medications   No medications on file  Discontinued Medications   No medications on file    Physical Exam:  Vitals:   08/24/19 1149  BP: (!) 142/80  Pulse: 68  Temp: (!) 96.9 F (36.1 C)  TempSrc: Temporal  SpO2: 98%  Weight: 120 lb (54.4 kg)  Height: 5' (1.524 m)   Body mass index is 23.44 kg/m. Wt Readings from Last 3 Encounters:  08/24/19 120 lb (54.4 kg)  07/23/19 121 lb (54.9 kg)  07/17/19 121 lb (54.9 kg)    Physical Exam Constitutional:      General: She is not in acute distress.    Appearance: She is well-developed. She is not diaphoretic.  HENT:     Head: Normocephalic and atraumatic.  Eyes:     Conjunctiva/sclera: Conjunctivae normal.     Pupils: Pupils are equal, round, and reactive to light.  Cardiovascular:     Rate and Rhythm: Normal rate and regular rhythm.     Heart sounds: Normal heart sounds.  Pulmonary:     Effort: Pulmonary effort is normal.     Breath sounds: Normal breath sounds.  Musculoskeletal:        General: No tenderness.     Cervical back: Normal range of motion and neck supple.  Skin:    General: Skin is warm and dry.    Neurological:     Mental Status: She is alert and oriented to person, place, and time. Mental status is at baseline.  Psychiatric:        Mood and Affect: Mood normal.        Behavior: Behavior normal.     Labs reviewed: Basic Metabolic Panel: Recent Labs    11/10/18 0904 04/15/19 1119 06/01/19 1221 06/05/19 0342 07/17/19 1621  NA 143   < > 140 140 140  K 4.3   < > 4.1 3.8 4.5  CL 105   < > 103 105 106  CO2 29   < > 28 27 27   GLUCOSE 96   < > 85 91 100  BUN 27*   < > 24* 10 31*  CREATININE 0.87   < > 0.92 0.89 0.83  CALCIUM 10.3   < > 10.3 8.6* 10.2  MG  --   --   --  2.1  --   PHOS  --   --   --  1.4*  --   TSH 1.88  --   --   --  1.52   < > = values in this interval not displayed.   Liver Function Tests: Recent Labs    04/15/19 1119 06/01/19 1221 07/17/19 1621  AST 28 30 20   ALT 25 26 17   ALKPHOS  --  54  --   BILITOT 0.4 0.5 0.2  PROT 6.7 6.9 6.6  ALBUMIN  --  3.7  --    No results for input(s): LIPASE, AMYLASE in the last 8760 hours. No results for input(s): AMMONIA in the last 8760 hours. CBC: Recent Labs    04/15/19 1119 06/01/19 1221  07/17/19 1621  WBC 7.8 7.0 6.6  NEUTROABS 5,343 4.4 3,663  HGB 13.1 13.7 12.5  HCT 40.0 43.6 37.9  MCV 84.7 90.1 87.7  PLT 121* 124* 141   Lipid Panel: Recent Labs    11/10/18 0904  CHOL 182  HDL 60  LDLCALC 104*  TRIG 88  CHOLHDL 3.0   TSH: Recent Labs    11/10/18 0904 07/17/19 1621  TSH 1.88 1.52   A1C: No results found for: HGBA1C   Assessment/Plan 1. Dizziness -ongoing, reviewed chart since march when she states dizziness changed and became per persistent. No new medications noted.  She does report seasonal allergies so this may have contributed. Continues on meclizine PRN - PT vestibular rehab; Future  2. Seasonal allergies -will have her start Claritin or zyrtec 10 mg (can use generic) daily for allergies and to see if this benefits dizziness.   3. Essential hypertension - home bp  reviewed and in office elevated above goal, will have her increase norvasc up to 5 mg daily at this time for more optimal control.   4. Memory loss -ongoing, did not tolerate medication for memory. Daughter living with daughter and helping assist with her.   Next appt: 4 months.  Carlos American. [place], [facility] [phone]

## 2019-08-24 NOTE — Patient Instructions (Addendum)
To increase norvasc back to 5 mg daily for blood pressure  To use Claritin or zyrtec (can use generic) 10 mg daily for allergies- this may help dizziness as well.   Follow up in 3 month for routine follow up, sooner if needed

## 2019-08-25 ENCOUNTER — Ambulatory Visit: Payer: Medicare Other | Admitting: Physical Therapy

## 2019-08-25 DIAGNOSIS — I972 Postmastectomy lymphedema syndrome: Secondary | ICD-10-CM

## 2019-08-25 DIAGNOSIS — M25512 Pain in left shoulder: Secondary | ICD-10-CM

## 2019-08-25 DIAGNOSIS — M25612 Stiffness of left shoulder, not elsewhere classified: Secondary | ICD-10-CM

## 2019-08-25 DIAGNOSIS — Z483 Aftercare following surgery for neoplasm: Secondary | ICD-10-CM

## 2019-08-25 NOTE — Therapy (Signed)
[place], [place], [address] Phone: [phone]   Fax:  [phone]  Physical Therapy Treatment  Patient Details  Name: [name] MRN: [id number] Date of Birth: [birth year] Referring Provider (PT): Dr. Irene Limbo  (Dr Barry Dienes is surgeon)   Encounter Date: 08/25/2019  PT End of Session - 08/25/19 1638    Visit Number  14    Number of Visits  17    Date for PT Re-Evaluation  08/31/19    PT Start Time  1400    PT Stop Time  1445    PT Time Calculation (min)  45 min    Activity Tolerance  Patient tolerated treatment well    Behavior During Therapy  [facility] for tasks assessed/performed       Past Medical History:  Diagnosis Date  . Arthritis   . Cancer [facility])    left breast cancer   . Cataracts, bilateral    removed by surgery  . CHF (congestive heart failure) ([place])    PACEMAKER & DEFIB  . Complication of anesthesia    hypotensive after back surgery in 2006  . Depression   . Dyslipidemia   . Fainted 04/21/06   AT [place]  . GERD (gastroesophageal reflux disease)   . Headache(784.0)   . Hearing loss    bilateral hearing aids  . HLD (hyperlipidemia)    diet controlled   . Hypertension   . Hypothyroidism   . ICD (implantable cardiac defibrillator) in place    pt has pacer/icd  . ICD (implantable cardiac defibrillator), biventricular, in situ   . LBBB (left bundle branch block)   . Memory loss   . Nonischemic cardiomyopathy ([place])   . Normal coronary arteries    s/p cardiac cath 2007  . Pacemaker    ICD [employer]  . Syncope   . Systolic CHF ([place])   . Vertigo   . Wears glasses     Past Surgical History:  Procedure Laterality Date  . BACK SURGERY     lumbar fusion   . BREAST LUMPECTOMY Left 2008  . BREAST SURGERY  2000   LUMP REMOVAL. STAGE 1 CANCER  . CARDIAC CATHETERIZATION    . CATARACT EXTRACTION    . COLONOSCOPY    . EYE SURGERY    . IMPLANTABLE CARDIOVERTER  DEFIBRILLATOR GENERATOR CHANGE N/A 12/18/2012   Procedure: IMPLANTABLE CARDIOVERTER DEFIBRILLATOR GENERATOR CHANGE;  Surgeon: [name], MD;  Location: [facility] CATH LAB;  Service: Cardiovascular;  Laterality: N/A;  . JOINT REPLACEMENT  06/14/01   right  . LUMBAR FUSION  2006  . MASS EXCISION  11/08/2011   Procedure: EXCISION MASS;  Surgeon: [name], MD;  [facility];  Service: General;  Laterality: Left;  Excision Left Thigh Mass  . MASTECTOMY W/ SENTINEL NODE BIOPSY Left 06/04/2019   Procedure: LEFT MASTECTOMY WITH SENTINEL LYMPH NODE BIOPSY;  Surgeon: [name], MD;  Location: [facility];  Service: General;  Laterality: Left;  Marland Kitchen MASTECTOMY, PARTIAL  2008   GOT PACEMAKER AND DEFIB AT THAT TIME  . PACEMAKER INSERTION  04/23/06  . TOTAL KNEE ARTHROPLASTY  05/17/01   RIGHT KNEE  . TOTAL KNEE ARTHROPLASTY Left 11/29/2014   Procedure: TOTAL LEFT KNEE ARTHROPLASTY;  Surgeon: Paralee Cancel, MD;  [facility];  Service: Orthopedics;  Laterality: Left;    There were no vitals filed for this visit.  Subjective Assessment - 08/25/19 1409    Subjective  Pt states she is doing  ok today    Currently in Pain?  Yes    Pain Score  3     Pain Location  Axilla                        [facility] Adult PT Treatment/Exercise - 08/25/19 0001      Exercises   Exercises  Shoulder      Knee/Hip Exercises: Standing   Forward Lunges  Right;Left;5 reps    Functional Squat  10 reps      Shoulder Exercises: Pulleys   Flexion  2 minutes      Manual Therapy   Myofascial Release  to tight scar tissue at left lateral chest, focused on inferior to mastectomy incision with trunk rotation to Rt; and then at Lt axilla and antecubital fossa    Manual Lymphatic Drainage (MLD)  In supine: short neck, bil shoulder collectors, 5 diaphragmatic breaths, Rt axillary and L inguinal nodes, anterior inter-axillary anastomosis but modified to avoid pace maker, L axillo-inguinal anastomosis, lateral L brachium,  medial to lateral L brachium, lateral L brachium, re-worked anastomosis (same as before), bottle neck, all surfaces of antebrachium, dorsum of the hand, re-worked all surfaces    Passive ROM  To Lt shoulder into flexion, abduction and D2 to pts tolerance               PT Short Term Goals - 06/30/19 1819      PT SHORT TERM GOAL #1   Title  Pt  will report pain in left shoulder is decreased to 6/10    Baseline  8/10 on 06/30/2019    Time  4    Status  New      PT SHORT TERM GOAL #2   Title  Pt wil be independent in a basic HEP for shoulder ROM    Time  4    Period  Weeks    Status  New        PT Long Term Goals - 08/03/19 1745      PT LONG TERM GOAL #1   Title  Pt will have reduction in left forearm at 15 cm proximla to ulnar styloid by 1 cm    Baseline  23.8 cm at baseline; 22.8 cm - 08/03/19    Status  Achieved      PT LONG TERM GOAL #2   Title  Pt and daugher will report they understand lymphedema risk reduction practices and how to manage left arm lymphedema at home    Baseline  Pt has been measured for compression garments - 08/03/19    Status  Partially Met      PT LONG TERM GOAL #3   Title  Pt will have 140 degrees of painfree left shoulder abduction so that she can easily perfom her ADLs at home    Baseline  115 and painful on 06/30/2019; 122 degrees - 08/03/19    Status  On-going      PT LONG TERM GOAL #4   Title  Patient will be Independent with HEP for shoulder ROM and strength    Status  Partially Met            Plan - 08/25/19 1638    Clinical Impression Statement  sent another email to sunmend to see if pt would be able to get the next size up of velcro wrap or if she has to come in for  remeasurement as transportation is a problem  soft  tissue work focused on tight areas at [place] chest at scar and muscle tissue    Comorbidities  previous triple negataive left breast cancer, seroma after lumpectomy, CHF with defibrillator, Bilateral TKR osteoporosis     Examination-Activity Limitations  Reach Overhead;Bathing    Rehab Potential  Good    PT Frequency  2x / week    PT Duration  8 weeks    PT Treatment/Interventions  ADLs/Self Care Home Management;DME Instruction;Therapeutic activities;Patient/family education;Orthotic Fit/Training;Manual techniques;Manual lymph drainage;Compression bandaging;Scar mobilization;Taping;Passive range of motion    PT Next Visit Plan  keep working on getting correct garment, ; focus on scar at lateral chest,  and cording in axilla cont MLD to left arm and gentle ROM being mindfull of defibrillator, progress exercise as pain decreases; manual techniques as needed for cording in left axilla    PT Home Exercise Plan  Supine dowel exercises; supine scapular series; doorway stretch for abduction    Consulted and Agree with Plan of Care  Patient       Patient will benefit from skilled therapeutic intervention in order to improve the following deficits and impairments:  Decreased activity tolerance, Decreased knowledge of precautions, Decreased knowledge of use of DME, Decreased range of motion, Decreased strength, Increased fascial restricitons, Increased edema, Impaired perceived functional ability, Increased muscle spasms, Impaired UE functional use, Postural dysfunction, Pain  Visit Diagnosis: Acute pain of left shoulder  Aftercare following surgery for neoplasm  Stiffness of left shoulder joint  Postmastectomy lymphedema     Problem List Patient Active Problem List   Diagnosis Date Noted  . Recurrent major depressive disorder, in partial remission ([place]) 07/17/2019  . Status post left mastectomy 07/01/2019  . Breast cancer of lower-outer quadrant of left female breast ([place]) 06/04/2019  . Candida infection, oral 01/15/2019  . Senile purpura ([place]) 04/14/2018  . Lumbar post-laminectomy syndrome 12/03/2017  . Lumbar spondylosis 12/03/2017  . History of back surgery 09/01/2017  . Constipation 09/01/2017  .  Hypothyroidism due to acquired atrophy of thyroid 09/01/2017  . Mixed hyperlipidemia 09/01/2017  . Age-related osteoporosis without current pathological fracture 09/01/2017  . High risk medication use 09/01/2017  . Arthritis of hand 05/17/2017  . Atherosclerosis of native arteries of extremity with intermittent claudication ([place]) 05/15/2017  . Status post total bilateral knee replacement 12/20/2016  . Gait abnormality 07/16/2016  . Chronic low back pain 07/16/2016  . Mild cognitive impairment 07/16/2016  . Wrist pain 05/10/2015  . S/P left TKA 11/29/2014  . S/P knee replacement 11/29/2014  . Spontaneous bruising 08/27/2014  . Numbness and tingling in right hand 07/28/2014  . Essential tremor 07/28/2014  . Dizziness 05/28/2014  . Shaky 05/28/2014  . Memory loss 05/28/2014  . Depression 05/06/2014  . Lipoma of left upper thigh 3x5 cm 09/28/2011  . Cerebral vascular accident ([place]) 08/09/2011  . Syncope 08/09/2011  . History of breast cancer [id number], s/p BCT 2008, triple negative 01/26/2011  . Chronic L breast pain with chronic recurrent seroma, s/p excisional biopsy 01/12/2010 01/26/2011  . Fainted   . ICD (implantable cardioverter-defibrillator), biventricular, in situ 08/02/2010  . Chronic systolic heart failure ([place]) 08/02/2010  . Hypertension 08/02/2010   Donato Heinz. Owens Shark PT  Norwood Levo 08/25/2019, 4:43 PM  [place], [place], [address] Phone: [phone]   Fax:  [phone]  Name: [name] MRN: [id number] Date of Birth: 11-08-1933

## 2019-08-26 ENCOUNTER — Other Ambulatory Visit: Payer: Self-pay | Admitting: Family

## 2019-08-26 DIAGNOSIS — I10 Essential (primary) hypertension: Secondary | ICD-10-CM

## 2019-08-27 ENCOUNTER — Encounter: Payer: Self-pay | Admitting: Physical Therapy

## 2019-08-27 ENCOUNTER — Ambulatory Visit: Payer: Medicare Other | Admitting: Physical Therapy

## 2019-08-27 ENCOUNTER — Other Ambulatory Visit: Payer: Self-pay

## 2019-08-27 DIAGNOSIS — M25512 Pain in left shoulder: Secondary | ICD-10-CM | POA: Diagnosis not present

## 2019-08-27 DIAGNOSIS — Z483 Aftercare following surgery for neoplasm: Secondary | ICD-10-CM

## 2019-08-27 DIAGNOSIS — I972 Postmastectomy lymphedema syndrome: Secondary | ICD-10-CM

## 2019-08-27 DIAGNOSIS — M25612 Stiffness of left shoulder, not elsewhere classified: Secondary | ICD-10-CM

## 2019-08-27 NOTE — Therapy (Signed)
[place], [place], [address] Phone: [phone]   Fax:  [phone]  Physical Therapy Treatment  Patient Details  Name: [name] MRN: [id number] Date of Birth: Apr 20, 1933 Referring Provider (PT): Dr. Irene Limbo  (Dr Barry Dienes is surgeon)   Encounter Date: 08/27/2019  PT End of Session - 08/27/19 1526    Visit Number  15    Number of Visits  17    Date for PT Re-Evaluation  08/31/19    PT Start Time  1300    PT Stop Time  1345    PT Time Calculation (min)  45 min       Past Medical History:  Diagnosis Date  . Arthritis   . Cancer [facility])    left breast cancer   . Cataracts, bilateral    removed by surgery  . CHF (congestive heart failure) ([place])    PACEMAKER & DEFIB  . Complication of anesthesia    hypotensive after back surgery in 2006  . Depression   . Dyslipidemia   . Fainted 04/21/06   AT [place]  . GERD (gastroesophageal reflux disease)   . Headache(784.0)   . Hearing loss    bilateral hearing aids  . HLD (hyperlipidemia)    diet controlled   . Hypertension   . Hypothyroidism   . ICD (implantable cardiac defibrillator) in place    pt has pacer/icd  . ICD (implantable cardiac defibrillator), biventricular, in situ   . LBBB (left bundle branch block)   . Memory loss   . Nonischemic cardiomyopathy ([place])   . Normal coronary arteries    s/p cardiac cath 2007  . Pacemaker    ICD [employer]  . Syncope   . Systolic CHF ([place])   . Vertigo   . Wears glasses     Past Surgical History:  Procedure Laterality Date  . BACK SURGERY     lumbar fusion   . BREAST LUMPECTOMY Left 2008  . BREAST SURGERY  2000   LUMP REMOVAL. STAGE 1 CANCER  . CARDIAC CATHETERIZATION    . CATARACT EXTRACTION    . COLONOSCOPY    . EYE SURGERY    . IMPLANTABLE CARDIOVERTER DEFIBRILLATOR GENERATOR CHANGE N/A 12/18/2012   Procedure: IMPLANTABLE CARDIOVERTER DEFIBRILLATOR GENERATOR CHANGE;  Surgeon:  [name], MD;  Location: [facility] CATH LAB;  Service: Cardiovascular;  Laterality: N/A;  . JOINT REPLACEMENT  06/14/01   right  . LUMBAR FUSION  2006  . MASS EXCISION  11/08/2011   Procedure: EXCISION MASS;  Surgeon: [name], MD;  [facility];  Service: General;  Laterality: Left;  Excision Left Thigh Mass  . MASTECTOMY W/ SENTINEL NODE BIOPSY Left 06/04/2019   Procedure: LEFT MASTECTOMY WITH SENTINEL LYMPH NODE BIOPSY;  Surgeon: [name], MD;  Location: [place];  Service: General;  Laterality: Left;  Marland Kitchen MASTECTOMY, PARTIAL  2008   GOT PACEMAKER AND DEFIB AT THAT TIME  . PACEMAKER INSERTION  04/23/06  . TOTAL KNEE ARTHROPLASTY  05/17/01   RIGHT KNEE  . TOTAL KNEE ARTHROPLASTY Left 11/29/2014   Procedure: TOTAL LEFT KNEE ARTHROPLASTY;  Surgeon: Paralee Cancel, MD;  [facility];  Service: Orthopedics;  Laterality: Left;    There were no vitals filed for this visit.  Subjective Assessment - 08/27/19 1307    Subjective  "I'm doing better, but in the body I am not strong"  Pt wants to get stronger.    Pertinent History  left breast cancer  with left mastectomy on 06/04/2019 with 5 lymph nodes . past history includes lumpectomy in 2008 with radiation and no lymph nodes removed she has chronic left breast pain and seroma with excisional biopsy in 2011.  Hx includes CHR with implanted defibrillator in left chest, bilateral TKR, osteoporosis    Patient Stated Goals  be able to lift my arm better to comb my hair  05/31/2019:  pt says she is able to do this now.    Currently in Pain?  No/denies                        [facility] Adult PT Treatment/Exercise - 08/27/19 0001      Ambulation/Gait   Ambulation/Gait  Yes    Gait Comments  attempted to teach walking with a cane in right hand to provide pt more confidence and stability in gait, but pt was not able to coordinate this and the effort of  trying to do this caused her to become more cautious in fear of dizziness.  Pt was also  stressed because of need to go to the rest room and felt better after she did this.       Exercises   Exercises  Neck;Shoulder;Knee/Hip      Neck Exercises: Seated   Other Seated Exercise  one rep of slow, felxion, extension and rotation to each side.  Pt very cautious as she felt this would increase her dizziness       Knee/Hip Exercises: Seated   Marching  Strengthening;Right;Left;10 reps   sitting on red disc also pelvic ant/post/lateral pelvic tilt     Shoulder Exercises: Seated   Retraction  AROM;Right;Left;5 reps    Flexion  Strengthening;Right;Left;10 reps;Weights    Flexion Weight (lbs)  2      Shoulder Exercises: Standing   Row  Strengthening;Right;Left;10 reps;Theraband   bilateral and unilateral    Theraband Level (Shoulder Row)  Level 1 (Yellow)    Shoulder Elevation  Strengthening;Right;Left;10 reps    Shoulder Elevation Limitations  only to about 30 degrees with 2 pound weight     Other Standing Exercises  bicep curls while standing on airex in corner so she could lean againg wall if needed       Shoulder Exercises: Pulleys   Flexion  2 minutes      Manual Therapy   Manual Therapy  Soft tissue mobilization;Myofascial release    Manual therapy comments  scar mobilizaion at lateral chest     Soft tissue mobilization  with pt in sidelying and with thick massage cream, soft tissue lifting with C and S strokes to mobilize soft tissue  pt with tender tightness at area of lats and teres major insertion on humerus that softened with manual work.      Myofascial Release  to tight scar tissue at left lateral chest, focused on inferior to mastectomy incision with trunk rotation to Rt; and then at Lt axilla and antecubital fossa               PT Short Term Goals - 06/30/19 1819      PT SHORT TERM GOAL #1   Title  Pt  will report pain in left shoulder is decreased to 6/10    Baseline  8/10 on 06/30/2019    Time  4    Status  New      PT SHORT TERM GOAL #2   Title   Pt wil be independent in a basic HEP  for shoulder ROM    Time  4    Period  Weeks    Status  New        PT Long Term Goals - 08/03/19 1745      PT LONG TERM GOAL #1   Title  Pt will have reduction in left forearm at 15 cm proximla to ulnar styloid by 1 cm    Baseline  23.8 cm at baseline; 22.8 cm - 08/03/19    Status  Achieved      PT LONG TERM GOAL #2   Title  Pt and daugher will report they understand lymphedema risk reduction practices and how to manage left arm lymphedema at home    Baseline  Pt has been measured for compression garments - 08/03/19    Status  Partially Met      PT LONG TERM GOAL #3   Title  Pt will have 140 degrees of painfree left shoulder abduction so that she can easily perfom her ADLs at home    Baseline  115 and painful on 06/30/2019; 122 degrees - 08/03/19    Status  On-going      PT LONG TERM GOAL #4   Title  Patient will be Independent with HEP for shoulder ROM and strength    Status  Partially Met            Plan - 08/27/19 1526    Clinical Impression Statement  Attempeted to increase exercise today , but pt was limited by fear of dizziness.  She does want to increase her strength though so will need to do program  in supine with head still or in sitting with head still to decrease stress on her whole body until she improves in endurance She continues to have tightness in her left lateral chest and posterior shoulder, but is slowly improving. Nancy Fetter med representative will send a larger velcro wrap so pt does not have to come in for remeasurement    Comorbidities  previous triple negataive left breast cancer, seroma after lumpectomy, CHF with defibrillator, Bilateral TKR osteoporosis    Examination-Activity Limitations  Reach Overhead;Bathing    Stability/Clinical Decision Making  Stable/Uncomplicated    Rehab Potential  Good    PT Duration  8 weeks    PT Treatment/Interventions  ADLs/Self Care Home Management;DME Instruction;Therapeutic  activities;Patient/family education;Orthotic Fit/Training;Manual techniques;Manual lymph drainage;Compression bandaging;Scar mobilization;Taping;Passive range of motion    PT Next Visit Plan  renew or discharge ??? Try supine scap series exericses in sitting as HEP progression cuing pt to keep core engaged.  She needs to keep head still while exercising  focus on scar at lateral chest, with soft tissue work at posterior shoudler  and cording in axilla cont MLD to left arm and gentle ROM being mindfull of defibrillator, progress exercise as pain decreases; manual techniques as needed for cording in left axilla    PT Home Exercise Plan  Supine dowel exercises; supine scapular series; doorway stretch for abduction    Consulted and Agree with Plan of Care  Patient       Patient will benefit from skilled therapeutic intervention in order to improve the following deficits and impairments:  Decreased activity tolerance, Decreased knowledge of precautions, Decreased knowledge of use of DME, Decreased range of motion, Decreased strength, Increased fascial restricitons, Increased edema, Impaired perceived functional ability, Increased muscle spasms, Impaired UE functional use, Postural dysfunction, Pain  Visit Diagnosis: Acute pain of left shoulder  Aftercare following surgery for neoplasm  Stiffness  of left shoulder joint  Postmastectomy lymphedema     Problem List Patient Active Problem List   Diagnosis Date Noted  . Recurrent major depressive disorder, in partial remission ([place]) 07/17/2019  . Status post left mastectomy 07/01/2019  . Breast cancer of lower-outer quadrant of left female breast ([place]) 06/04/2019  . Candida infection, oral 01/15/2019  . Senile purpura ([place]) 04/14/2018  . Lumbar post-laminectomy syndrome 12/03/2017  . Lumbar spondylosis 12/03/2017  . History of back surgery 09/01/2017  . Constipation 09/01/2017  . Hypothyroidism due to acquired atrophy of thyroid 09/01/2017  .  Mixed hyperlipidemia 09/01/2017  . Age-related osteoporosis without current pathological fracture 09/01/2017  . High risk medication use 09/01/2017  . Arthritis of hand 05/17/2017  . Atherosclerosis of native arteries of extremity with intermittent claudication ([place]) 05/15/2017  . Status post total bilateral knee replacement 12/20/2016  . Gait abnormality 07/16/2016  . Chronic low back pain 07/16/2016  . Mild cognitive impairment 07/16/2016  . Wrist pain 05/10/2015  . S/P left TKA 11/29/2014  . S/P knee replacement 11/29/2014  . Spontaneous bruising 08/27/2014  . Numbness and tingling in right hand 07/28/2014  . Essential tremor 07/28/2014  . Dizziness 05/28/2014  . Shaky 05/28/2014  . Memory loss 05/28/2014  . Depression 05/06/2014  . Lipoma of left upper thigh 3x5 cm 09/28/2011  . Cerebral vascular accident ([place]) 08/09/2011  . Syncope 08/09/2011  . History of breast cancer T1bNxMx, s/p BCT 2008, triple negative 01/26/2011  . Chronic L breast pain with chronic recurrent seroma, s/p excisional biopsy 01/12/2010 01/26/2011  . Fainted   . ICD (implantable cardioverter-defibrillator), biventricular, in situ 08/02/2010  . Chronic systolic heart failure ([place]) 08/02/2010  . Hypertension 08/02/2010   Donato Heinz. Owens Shark PT  Norwood Levo 08/27/2019, 3:32 PM  [place], [place], [address] Phone: [phone]   Fax:  [phone]  Name: [name] MRN: [id number] Date of Birth: Apr 18, 1933

## 2019-09-01 ENCOUNTER — Other Ambulatory Visit: Payer: Self-pay

## 2019-09-01 ENCOUNTER — Ambulatory Visit: Payer: Medicare Other | Admitting: Physical Therapy

## 2019-09-01 DIAGNOSIS — M25512 Pain in left shoulder: Secondary | ICD-10-CM | POA: Diagnosis not present

## 2019-09-01 DIAGNOSIS — Z483 Aftercare following surgery for neoplasm: Secondary | ICD-10-CM

## 2019-09-01 DIAGNOSIS — I972 Postmastectomy lymphedema syndrome: Secondary | ICD-10-CM

## 2019-09-01 DIAGNOSIS — M25612 Stiffness of left shoulder, not elsewhere classified: Secondary | ICD-10-CM

## 2019-09-01 NOTE — Therapy (Signed)
[place], [place], [address] Phone: [phone]   Fax:  [phone]  Physical Therapy Treatment  Patient Details  Name: [name] MRN: [id number] Date of Birth: 12/09/1933 Referring Provider (PT): Dr. Irene Limbo  (Dr Barry Dienes is surgeon)   Encounter Date: 09/01/2019  PT End of Session - 09/01/19 1818    Visit Number  16    Number of Visits  17    Date for PT Re-Evaluation  10/05/19    PT Start Time  1400    PT Stop Time  1445    PT Time Calculation (min)  45 min    Activity Tolerance  Patient tolerated treatment well    Behavior During Therapy  [facility] for tasks assessed/performed       Past Medical History:  Diagnosis Date  . Arthritis   . Cancer [facility])    left breast cancer   . Cataracts, bilateral    removed by surgery  . CHF (congestive heart failure) ([place])    PACEMAKER & DEFIB  . Complication of anesthesia    hypotensive after back surgery in 2006  . Depression   . Dyslipidemia   . Fainted 04/21/06   AT [place]  . GERD (gastroesophageal reflux disease)   . Headache(784.0)   . Hearing loss    bilateral hearing aids  . HLD (hyperlipidemia)    diet controlled   . Hypertension   . Hypothyroidism   . ICD (implantable cardiac defibrillator) in place    pt has pacer/icd  . ICD (implantable cardiac defibrillator), biventricular, in situ   . LBBB (left bundle branch block)   . Memory loss   . Nonischemic cardiomyopathy ([place])   . Normal coronary arteries    s/p cardiac cath 2007  . Pacemaker    ICD [employer]  . Syncope   . Systolic CHF ([place])   . Vertigo   . Wears glasses     Past Surgical History:  Procedure Laterality Date  . BACK SURGERY     lumbar fusion   . BREAST LUMPECTOMY Left 2008  . BREAST SURGERY  2000   LUMP REMOVAL. STAGE 1 CANCER  . CARDIAC CATHETERIZATION    . CATARACT EXTRACTION    . COLONOSCOPY    . EYE SURGERY    . IMPLANTABLE CARDIOVERTER  DEFIBRILLATOR GENERATOR CHANGE N/A 12/18/2012   Procedure: IMPLANTABLE CARDIOVERTER DEFIBRILLATOR GENERATOR CHANGE;  Surgeon: [name], MD;  Location: [facility] CATH LAB;  Service: Cardiovascular;  Laterality: N/A;  . JOINT REPLACEMENT  06/14/01   right  . LUMBAR FUSION  2006  . MASS EXCISION  11/08/2011   Procedure: EXCISION MASS;  Surgeon: [name], MD;  [facility];  Service: General;  Laterality: Left;  Excision Left Thigh Mass  . MASTECTOMY W/ SENTINEL NODE BIOPSY Left 06/04/2019   Procedure: LEFT MASTECTOMY WITH SENTINEL LYMPH NODE BIOPSY;  Surgeon: [name], MD;  Location: [place];  Service: General;  Laterality: Left;  Marland Kitchen MASTECTOMY, PARTIAL  2008   GOT PACEMAKER AND DEFIB AT THAT TIME  . PACEMAKER INSERTION  04/23/06  . TOTAL KNEE ARTHROPLASTY  05/17/01   RIGHT KNEE  . TOTAL KNEE ARTHROPLASTY Left 11/29/2014   Procedure: TOTAL LEFT KNEE ARTHROPLASTY;  Surgeon: Paralee Cancel, MD;  [facility];  Service: Orthopedics;  Laterality: Left;    There were no vitals filed for this visit.  Subjective Assessment - 09/01/19 1411    Subjective  Pt states she is doing  exercises at home and feels that she can continue to do those.  She still has pain in the back of her shoulder sometimes, but it is better than it was before A larger compression sleeve is on order and she is waiting for it to arrive. She feels like she is ready to discharge from PT and come back one more time to make sure new garmnet fits ok and she can apply it correctly    Pertinent History  left breast cancer with left mastectomy on 06/04/2019 with 5 lymph nodes . past history includes lumpectomy in 2008 with radiation and no lymph nodes removed she has chronic left breast pain and seroma with excisional biopsy in 2011.  Hx includes CHR with implanted defibrillator in left chest, bilateral TKR, osteoporosis    Patient Stated Goals  be able to lift my arm better to comb my hair  05/31/2019:  pt says she is able to do this now.     Currently in Pain?  Yes    Pain Score  3     Pain Location  Axilla    Pain Type  Chronic pain    Pain Onset  More than a month ago    Pain Frequency  Intermittent    Pain Relieving Factors  tylenol         [facility] PT Assessment - 09/01/19 0001      Assessment   Medical Diagnosis  left breast cancer     Referring Provider (PT)  Dr. Irene Limbo    Dr Barry Dienes is surgeon   Onset Date/Surgical Date  06/04/19      Prior Function   Level of Independence  Independent        LYMPHEDEMA/ONCOLOGY QUESTIONNAIRE - 09/01/19 1446      Left Upper Extremity Lymphedema   10 cm Proximal to Olecranon Process  25.5 cm    Olecranon Process  22.1 cm    15 cm Proximal to Ulnar Styloid Process  22 cm    10 cm Proximal to Ulnar Styloid Process  18.9 cm    Just Proximal to Ulnar Styloid Process  15.1 cm    Across Hand at [employer]  16.8 cm    At [place] of 2nd Digit  5.8 cm                 [facility] Adult PT Treatment/Exercise - 09/01/19 0001      Exercises   Exercises  Shoulder;Knee/Hip      Knee/Hip Exercises: Seated   Marching  Strengthening;Right;Left;10 reps;Weights    Marching Weights  1 lbs.      Shoulder Exercises: Seated   External Rotation  Strengthening;Right;Left;5 reps;Theraband    Theraband Level (Shoulder External Rotation)  Level 2 (Red)    Flexion  Strengthening;Right;Left;10 reps;Theraband    Theraband Level (Shoulder Flexion)  Level 2 (Red)    Diagonals  Strengthening;Right;Left;Theraband    Theraband Level (Shoulder Diagonals)  Level 2 (Red)      Manual Therapy   Manual Therapy  Soft tissue mobilization;Myofascial release    Manual therapy comments  scar mobilizaion at lateral chest     Soft tissue mobilization  with pt in sidelying and with thick massage cream, soft tissue lifting with C and S strokes to mobilize soft tissue  pt with tender tightness at area of lats and teres major insertion on humerus that softened with manual work.      Myofascial Release  to  tight scar tissue at left lateral  chest, focused on inferior to mastectomy incision with trunk rotation to Rt; and then at Lt axilla and antecubital fossa               PT Short Term Goals - 09/01/19 1814      PT SHORT TERM GOAL #1   Title  Pt  will report pain in left shoulder is decreased to 6/10    Baseline  8/10 on 06/30/2019, 3/10 on 09/01/2019    Status  Achieved      PT SHORT TERM GOAL #2   Title  Pt wil be independent in a basic HEP for shoulder ROM    Status  Achieved        PT Long Term Goals - 09/01/19 1814      PT LONG TERM GOAL #1   Baseline  23.8 cm at baseline; 22.8 cm - 08/03/19    Status  Achieved      PT LONG TERM GOAL #2   Title  Pt and daugher will report they understand lymphedema risk reduction practices and how to manage left arm lymphedema at home    Status  On-going      PT LONG TERM GOAL #3   Title  Pt will have 140 degrees of painfree left shoulder abduction so that she can easily perfom her ADLs at home    Baseline  115 and painful on 06/30/2019; 122 degrees - 08/03/19    Status  Deferred      PT LONG TERM GOAL #4   Title  Patient will be Independent with HEP for shoulder ROM and strength    Status  Achieved            Plan - 09/01/19 1805    Clinical Impression Statement  Reviewed home exercises today.  Pt continues to have pain in left posterior shoulder, but she thinks it may take time to decrease it. She is doing her scar massage and shoulder exercise at home She feels like she is ready to discharge from PT and come back one more time to make sure new garmnet fits ok and she can apply it correctly Renewal sent to cover this visit    Comorbidities  previous triple negataive left breast cancer, seroma after lumpectomy, CHF with defibrillator, Bilateral TKR osteoporosis    Examination-Activity Limitations  Reach Overhead;Bathing    PT Duration  8 weeks    PT Treatment/Interventions  ADLs/Self Care Home Management;DME  Instruction;Therapeutic activities;Patient/family education;Orthotic Fit/Training;Manual techniques;Manual lymph drainage;Compression bandaging;Scar mobilization;Taping;Passive range of motion    PT Next Visit Plan  check garment fit and reviw how pt can apply it. discharge    PT Home Exercise Plan  Supine dowel exercises; supine scapular series; doorway stretch for abduction, standing rows seated theraband shoulder exercise    Consulted and Agree with Plan of Care  Patient       Patient will benefit from skilled therapeutic intervention in order to improve the following deficits and impairments:  Decreased activity tolerance, Decreased knowledge of precautions, Decreased knowledge of use of DME, Decreased range of motion, Decreased strength, Increased fascial restricitons, Increased edema, Impaired perceived functional ability, Increased muscle spasms, Impaired UE functional use, Postural dysfunction, Pain  Visit Diagnosis: Acute pain of left shoulder - Plan: PT plan of care cert/re-cert  Aftercare following surgery for neoplasm - Plan: PT plan of care cert/re-cert  Stiffness of left shoulder joint - Plan: PT plan of care cert/re-cert  Postmastectomy lymphedema - Plan: PT plan of care cert/re-cert  Problem List Patient Active Problem List   Diagnosis Date Noted  . Recurrent major depressive disorder, in partial remission ([place]) 07/17/2019  . Status post left mastectomy 07/01/2019  . Breast cancer of lower-outer quadrant of left female breast ([place]) 06/04/2019  . Candida infection, oral 01/15/2019  . Senile purpura ([place]) 04/14/2018  . Lumbar post-laminectomy syndrome 12/03/2017  . Lumbar spondylosis 12/03/2017  . History of back surgery 09/01/2017  . Constipation 09/01/2017  . Hypothyroidism due to acquired atrophy of thyroid 09/01/2017  . Mixed hyperlipidemia 09/01/2017  . Age-related osteoporosis without current pathological fracture 09/01/2017  . High risk medication use  09/01/2017  . Arthritis of hand 05/17/2017  . Atherosclerosis of native arteries of extremity with intermittent claudication ([place]) 05/15/2017  . Status post total bilateral knee replacement 12/20/2016  . Gait abnormality 07/16/2016  . Chronic low back pain 07/16/2016  . Mild cognitive impairment 07/16/2016  . Wrist pain 05/10/2015  . S/P left TKA 11/29/2014  . S/P knee replacement 11/29/2014  . Spontaneous bruising 08/27/2014  . Numbness and tingling in right hand 07/28/2014  . Essential tremor 07/28/2014  . Dizziness 05/28/2014  . Shaky 05/28/2014  . Memory loss 05/28/2014  . Depression 05/06/2014  . Lipoma of left upper thigh 3x5 cm 09/28/2011  . Cerebral vascular accident ([place]) 08/09/2011  . Syncope 08/09/2011  . History of breast cancer T1bNxMx, s/p BCT 2008, triple negative 01/26/2011  . Chronic L breast pain with chronic recurrent seroma, s/p excisional biopsy 01/12/2010 01/26/2011  . Fainted   . ICD (implantable cardioverter-defibrillator), biventricular, in situ 08/02/2010  . Chronic systolic heart failure ([place]) 08/02/2010  . Hypertension 08/02/2010   Donato Heinz. Owens Shark PT  Norwood Levo 09/01/2019, 6:19 PM  [place], [place], [address] Phone: [phone]   Fax:  [phone]  Name: [name] MRN: [id number] Date of Birth: [birth year]

## 2019-09-03 ENCOUNTER — Ambulatory Visit: Payer: Medicare Other | Admitting: Physical Therapy

## 2019-09-10 ENCOUNTER — Other Ambulatory Visit: Payer: Self-pay

## 2019-09-10 ENCOUNTER — Encounter: Payer: Self-pay | Admitting: Physical Therapy

## 2019-09-10 ENCOUNTER — Ambulatory Visit: Payer: Medicare Other | Attending: Hematology | Admitting: Physical Therapy

## 2019-09-10 DIAGNOSIS — I972 Postmastectomy lymphedema syndrome: Secondary | ICD-10-CM

## 2019-09-10 DIAGNOSIS — Z483 Aftercare following surgery for neoplasm: Secondary | ICD-10-CM | POA: Insufficient documentation

## 2019-09-10 DIAGNOSIS — M25612 Stiffness of left shoulder, not elsewhere classified: Secondary | ICD-10-CM | POA: Insufficient documentation

## 2019-09-10 DIAGNOSIS — M25512 Pain in left shoulder: Secondary | ICD-10-CM | POA: Insufficient documentation

## 2019-09-10 NOTE — Therapy (Signed)
[place], [place], [address] Phone: [phone]   Fax:  [phone]  Physical Therapy Treatment  Patient Details  Name: [name] MRN: [id number] Date of Birth: 04-Sep-1933 Referring Provider (PT): Dr. Irene Limbo  (Dr Barry Dienes is surgeon)   Encounter Date: 09/10/2019  PT End of Session - 09/10/19 1704    Visit Number  17    Number of Visits  17    Date for PT Re-Evaluation  10/05/19    PT Start Time  1602    PT Stop Time  1658    PT Time Calculation (min)  56 min    Activity Tolerance  Patient tolerated treatment well    Behavior During Therapy  [facility] for tasks assessed/performed       Past Medical History:  Diagnosis Date  . Arthritis   . Cancer [facility])    left breast cancer   . Cataracts, bilateral    removed by surgery  . CHF (congestive heart failure) ([place])    PACEMAKER & DEFIB  . Complication of anesthesia    hypotensive after back surgery in 2006  . Depression   . Dyslipidemia   . Fainted 04/21/06   AT [place]  . GERD (gastroesophageal reflux disease)   . Headache(784.0)   . Hearing loss    bilateral hearing aids  . HLD (hyperlipidemia)    diet controlled   . Hypertension   . Hypothyroidism   . ICD (implantable cardiac defibrillator) in place    pt has pacer/icd  . ICD (implantable cardiac defibrillator), biventricular, in situ   . LBBB (left bundle branch block)   . Memory loss   . Nonischemic cardiomyopathy ([place])   . Normal coronary arteries    s/p cardiac cath 2007  . Pacemaker    ICD [employer]  . Syncope   . Systolic CHF ([place])   . Vertigo   . Wears glasses     Past Surgical History:  Procedure Laterality Date  . BACK SURGERY     lumbar fusion   . BREAST LUMPECTOMY Left 2008  . BREAST SURGERY  2000   LUMP REMOVAL. STAGE 1 CANCER  . CARDIAC CATHETERIZATION    . CATARACT EXTRACTION    . COLONOSCOPY    . EYE SURGERY    . IMPLANTABLE CARDIOVERTER  DEFIBRILLATOR GENERATOR CHANGE N/A 12/18/2012   Procedure: IMPLANTABLE CARDIOVERTER DEFIBRILLATOR GENERATOR CHANGE;  Surgeon: [name], MD;  Location[facility] CATH LAB;  Service: Cardiovascular;  Laterality: N/A;  . JOINT REPLACEMENT  06/14/01   right  . LUMBAR FUSION  2006  . MASS EXCISION  11/08/2011   Procedure: EXCISION MASS;  Surgeon: [name], MD;  Location[facility];  Service: General;  Laterality: Left;  Excision Left Thigh Mass  . MASTECTOMY W/ SENTINEL NODE BIOPSY Left 06/04/2019   Procedure: LEFT MASTECTOMY WITH SENTINEL LYMPH NODE BIOPSY;  Surgeon: [name], MD;  Location: [place];  Service: General;  Laterality: Left;  Marland Kitchen MASTECTOMY, PARTIAL  2008   GOT PACEMAKER AND DEFIB AT THAT TIME  . PACEMAKER INSERTION  04/23/06  . TOTAL KNEE ARTHROPLASTY  05/17/01   RIGHT KNEE  . TOTAL KNEE ARTHROPLASTY Left 11/29/2014   Procedure: TOTAL LEFT KNEE ARTHROPLASTY;  Surgeon: Paralee Cancel, MD;  Location[facility];  Service: Orthopedics;  Laterality: Left;    There were no vitals filed for this visit.  Subjective Assessment - 09/10/19 1612    Subjective  I got my sleeve and  it comes up higher.    Patient is accompained by:  Family member    Pertinent History  left breast cancer with left mastectomy on 06/04/2019 with 5 lymph nodes . past history includes lumpectomy in 2008 with radiation and no lymph nodes removed she has chronic left breast pain and seroma with excisional biopsy in 2011.  Hx includes CHR with implanted defibrillator in left chest, bilateral TKR, osteoporosis    Patient Stated Goals  be able to lift my arm better to comb my hair  05/31/2019:  pt says she is able to do this now.    Currently in Pain?  No/denies    Pain Score  0-No pain            LYMPHEDEMA/ONCOLOGY QUESTIONNAIRE - 09/10/19 1620      Left Upper Extremity Lymphedema   10 cm Proximal to Olecranon Process  26 cm    Olecranon Process  22 cm    15 cm Proximal to Ulnar Styloid Process  20.5 cm    10 cm Proximal  to Ulnar Styloid Process  17.6 cm    Just Proximal to Ulnar Styloid Process  15.1 cm    Across Hand at [employer]  17.6 cm    At [place] of 2nd Digit  6 cm                 [facility] Adult PT Treatment/Exercise - 09/10/19 0001      Manual Therapy   Manual Therapy  Edema management    Edema Management  pt arrived with medium farrow wrap on arm- it was not providing enough compression- see assessment section in note for more detail               PT Short Term Goals - 09/01/19 1814      PT SHORT TERM GOAL #1   Title  Pt  will report pain in left shoulder is decreased to 6/10    Baseline  8/10 on 06/30/2019, 3/10 on 09/01/2019    Status  Achieved      PT SHORT TERM GOAL #2   Title  Pt wil be independent in a basic HEP for shoulder ROM    Status  Achieved        PT Long Term Goals - 09/01/19 1814      PT LONG TERM GOAL #1   Baseline  23.8 cm at baseline; 22.8 cm - 08/03/19    Status  Achieved      PT LONG TERM GOAL #2   Title  Pt and daugher will report they understand lymphedema risk reduction practices and how to manage left arm lymphedema at home    Status  On-going      PT LONG TERM GOAL #3   Title  Pt will have 140 degrees of painfree left shoulder abduction so that she can easily perfom her ADLs at home    Baseline  115 and painful on 06/30/2019; 122 degrees - 08/03/19    Status  Deferred      PT LONG TERM GOAL #4   Title  Patient will be Independent with HEP for shoulder ROM and strength    Status  Achieved            Plan - 09/10/19 1705    Clinical Impression Statement  Pt received medium farrow wrap and came for PT to assess fit and ensure appropriate compression. When pt arrived garment was too loose and fabric could easily  be pinched. Educated pt to tighen wrap and then reassessed fit. Garment was still too lose and not providing appropriate compression. Therapist was able to don garment more tightly but the medium size is too large for pt. She  had a size small last and reported it was too tight at her upper arm and made it feel bruised. Therapist assessed the size small on and it is the correct size but pt is unable to tolerate it at upper arm. She was able to don the small easier with better compression level because it was the correct size. Pt did not feel like she could tolerate the upper arm section. Had pt re don the medium size and educated pt on how to don it and then re tighten all straps to increase compression. Educated pt to wear it 12 hours a day for the next week and a half then come back to the clinic to have circumferences remeasured. If her measurements remain stable she can continue to use the medium but if they go up we may need to figure out another compression option that patient can tolerate. Pt and daughter were agreeable to this plan. Circumference measurements taken today demonstate slight increase at upper arm and compared to last session.    PT Frequency  2x / week    PT Duration  8 weeks    PT Treatment/Interventions  ADLs/Self Care Home Management;DME Instruction;Therapeutic activities;Patient/family education;Orthotic Fit/Training;Manual techniques;Manual lymph drainage;Compression bandaging;Scar mobilization;Taping;Passive range of motion    PT Next Visit Plan  re check garment fit and re measure circumference to see if the medium is providing enough compression/ dc if measurements have not increased or try size small again    PT Home Exercise Plan  Supine dowel exercises; supine scapular series; doorway stretch for abduction, standing rows seated theraband shoulder exercise    Consulted and Agree with Plan of Care  Patient       Patient will benefit from skilled therapeutic intervention in order to improve the following deficits and impairments:  Decreased activity tolerance, Decreased knowledge of precautions, Decreased knowledge of use of DME, Decreased range of motion, Decreased strength, Increased fascial  restricitons, Increased edema, Impaired perceived functional ability, Increased muscle spasms, Impaired UE functional use, Postural dysfunction, Pain  Visit Diagnosis: Postmastectomy lymphedema     Problem List Patient Active Problem List   Diagnosis Date Noted  . Recurrent major depressive disorder, in partial remission ([place]) 07/17/2019  . Status post left mastectomy 07/01/2019  . Breast cancer of lower-outer quadrant of left female breast ([place]) 06/04/2019  . Candida infection, oral 01/15/2019  . Senile purpura ([place]) 04/14/2018  . Lumbar post-laminectomy syndrome 12/03/2017  . Lumbar spondylosis 12/03/2017  . History of back surgery 09/01/2017  . Constipation 09/01/2017  . Hypothyroidism due to acquired atrophy of thyroid 09/01/2017  . Mixed hyperlipidemia 09/01/2017  . Age-related osteoporosis without current pathological fracture 09/01/2017  . High risk medication use 09/01/2017  . Arthritis of hand 05/17/2017  . Atherosclerosis of native arteries of extremity with intermittent claudication ([place]) 05/15/2017  . Status post total bilateral knee replacement 12/20/2016  . Gait abnormality 07/16/2016  . Chronic low back pain 07/16/2016  . Mild cognitive impairment 07/16/2016  . Wrist pain 05/10/2015  . S/P left TKA 11/29/2014  . S/P knee replacement 11/29/2014  . Spontaneous bruising 08/27/2014  . Numbness and tingling in right hand 07/28/2014  . Essential tremor 07/28/2014  . Dizziness 05/28/2014  . Shaky 05/28/2014  . Memory loss 05/28/2014  .  Depression 05/06/2014  . Lipoma of left upper thigh 3x5 cm 09/28/2011  . Cerebral vascular accident ([place]) 08/09/2011  . Syncope 08/09/2011  . History of breast cancer T1bNxMx, s/p BCT 2008, triple negative 01/26/2011  . Chronic L breast pain with chronic recurrent seroma, s/p excisional biopsy 01/12/2010 01/26/2011  . Fainted   . ICD (implantable cardioverter-defibrillator), biventricular, in situ 08/02/2010  . Chronic systolic  heart failure ([place]) 08/02/2010  . Hypertension 08/02/2010    Allyson Sabal [facility] 09/10/2019, 5:12 PM  [place], [place], [address] Phone: [phone]   Fax:  [phone]  Name: Kayshia Ruise MRN: [id number] Date of Birth: 12-Feb-1934  Manus Gunning, PT 09/10/19 5:12 PM

## 2019-09-22 ENCOUNTER — Ambulatory Visit: Payer: Medicare Other | Admitting: Physical Therapy

## 2019-09-22 ENCOUNTER — Other Ambulatory Visit: Payer: Self-pay

## 2019-09-22 ENCOUNTER — Encounter: Payer: Self-pay | Admitting: Physical Therapy

## 2019-09-22 DIAGNOSIS — Z483 Aftercare following surgery for neoplasm: Secondary | ICD-10-CM

## 2019-09-22 DIAGNOSIS — I972 Postmastectomy lymphedema syndrome: Secondary | ICD-10-CM | POA: Diagnosis not present

## 2019-09-22 DIAGNOSIS — M25612 Stiffness of left shoulder, not elsewhere classified: Secondary | ICD-10-CM

## 2019-09-22 DIAGNOSIS — M25512 Pain in left shoulder: Secondary | ICD-10-CM

## 2019-09-22 NOTE — Therapy (Signed)
[place], [place], [address] Phone: [phone]   Fax:  [phone]  Physical Therapy Treatment  Patient Details  Name: [name] MRN: [id number] Date of Birth: 12/01/1933 Referring Provider (PT): Dr. Irene Limbo  (Dr Barry Dienes is surgeon)   Encounter Date: 09/22/2019   PT End of Session - 09/22/19 1730    Visit Number 18    Number of Visits 17    Date for PT Re-Evaluation 10/05/19    PT Start Time 1400    PT Stop Time 1440    PT Time Calculation (min) 40 min    Activity Tolerance Patient tolerated treatment well           Past Medical History:  Diagnosis Date  . Arthritis   . Cancer [facility])    left breast cancer   . Cataracts, bilateral    removed by surgery  . CHF (congestive heart failure) ([place])    PACEMAKER & DEFIB  . Complication of anesthesia    hypotensive after back surgery in 2006  . Depression   . Dyslipidemia   . Fainted 04/21/06   AT [place]  . GERD (gastroesophageal reflux disease)   . Headache(784.0)   . Hearing loss    bilateral hearing aids  . HLD (hyperlipidemia)    diet controlled   . Hypertension   . Hypothyroidism   . ICD (implantable cardiac defibrillator) in place    pt has pacer/icd  . ICD (implantable cardiac defibrillator), biventricular, in situ   . LBBB (left bundle branch block)   . Memory loss   . Nonischemic cardiomyopathy ([place])   . Normal coronary arteries    s/p cardiac cath 2007  . Pacemaker    ICD [employer]  . Syncope   . Systolic CHF ([place])   . Vertigo   . Wears glasses     Past Surgical History:  Procedure Laterality Date  . BACK SURGERY     lumbar fusion   . BREAST LUMPECTOMY Left 2008  . BREAST SURGERY  2000   LUMP REMOVAL. STAGE 1 CANCER  . CARDIAC CATHETERIZATION    . CATARACT EXTRACTION    . COLONOSCOPY    . EYE SURGERY    . IMPLANTABLE CARDIOVERTER DEFIBRILLATOR GENERATOR CHANGE N/A 12/18/2012   Procedure: IMPLANTABLE  CARDIOVERTER DEFIBRILLATOR GENERATOR CHANGE;  Surgeon: [name], MD;  Location: [facility] CATH LAB;  Service: Cardiovascular;  Laterality: N/A;  . JOINT REPLACEMENT  06/14/01   right  . LUMBAR FUSION  2006  . MASS EXCISION  11/08/2011   Procedure: EXCISION MASS;  Surgeon: [name], MD;  [facility];  Service: General;  Laterality: Left;  Excision Left Thigh Mass  . MASTECTOMY W/ SENTINEL NODE BIOPSY Left 06/04/2019   Procedure: LEFT MASTECTOMY WITH SENTINEL LYMPH NODE BIOPSY;  Surgeon: [name], MD;  Location: [place];  Service: General;  Laterality: Left;  Marland Kitchen MASTECTOMY, PARTIAL  2008   GOT PACEMAKER AND DEFIB AT THAT TIME  . PACEMAKER INSERTION  04/23/06  . TOTAL KNEE ARTHROPLASTY  05/17/01   RIGHT KNEE  . TOTAL KNEE ARTHROPLASTY Left 11/29/2014   Procedure: TOTAL LEFT KNEE ARTHROPLASTY;  Surgeon: Paralee Cancel, MD;  [facility];  Service: Orthopedics;  Laterality: Left;    There were no vitals filed for this visit.   Subjective Assessment - 09/22/19 1401    Subjective Pt says she is bothered by the pain in her left upper arm all the time when she  is not wearing the sleeve.  She is wearing the sleeve at least 6 hours a day but by the time evening comes it irritates her so much she has to take it off    Pertinent History left breast cancer with left mastectomy on 06/04/2019 with 5 lymph nodes . past history includes lumpectomy in 2008 with radiation and no lymph nodes removed she has chronic left breast pain and seroma with excisional biopsy in 2011.  Hx includes CHR with implanted defibrillator in left chest, bilateral TKR, osteoporosis    Patient Stated Goals be able to lift my arm better to comb my hair  05/31/2019:  pt says she is able to do this now.    Currently in Pain? Yes    Pain Score 6     Pain Location Arm    Pain Orientation Left    Pain Descriptors / Indicators Aching    Pain Type Chronic pain    Pain Radiating Towards does not radiation    Pain Onset More than a month  ago    Pain Frequency Constant    Aggravating Factors  when she has the sleeve on it hurts more when she touches her arm    Effect of Pain on Daily Activities not really                 LYMPHEDEMA/ONCOLOGY QUESTIONNAIRE - 09/22/19 0001      Left Upper Extremity Lymphedema   10 cm Proximal to Olecranon Process 26 cm    Olecranon Process 22 cm    15 cm Proximal to Ulnar Styloid Process 21.5 cm    10 cm Proximal to Ulnar Styloid Process 18.9 cm    Just Proximal to Ulnar Styloid Process 15.1 cm    Across Hand at [employer] 17.1 cm    At [place] of 2nd Digit 5.8 cm                      [facility] Adult PT Treatment/Exercise - 09/22/19 0001      Exercises   Exercises Other Exercises    Other Exercises  reinforced shoulder reaching exercises and had pt practice putting lotion bottle up on shelf 5 reps x 2 and encouraged her to do the same with canned goods in her kitchen.  Aslo reinforce wall stretches which she says she is doing. at home       Manual Therapy   Manual Therapy Edema management    Manual therapy comments remeasuared arm.  she had not increase in circumferences while wearing the size medium sleeve    Edema Management extra time spent on technique to apply velcro garment and pull straps tight to fit. pt was able to do it and check to make sure elbow was in the right place.  Aslo emphasized the importance of wearing compression glove while sleeve is on.                   PT Education - 09/22/19 1726    Education Details how to apply her velcro compression sleeve and glove    Person(s) Educated Patient    Methods Explanation;Demonstration;Verbal cues;Tactile cues    Comprehension Returned demonstration            PT Short Term Goals - 09/22/19 1734      PT SHORT TERM GOAL #1   Title Pt  will report pain in left shoulder is decreased to 6/10    Status Achieved  PT SHORT TERM GOAL #2   Title Pt wil be independent in a basic HEP for  shoulder ROM    Status Achieved             PT Long Term Goals - 09/22/19 1734      PT LONG TERM GOAL #1   Title Pt will have reduction in left forearm at 15 cm proximla to ulnar styloid by 1 cm    Status Achieved      PT LONG TERM GOAL #2   Title Pt and daugher will report they understand lymphedema risk reduction practices and how to manage left arm lymphedema at home    Status Achieved      PT LONG TERM GOAL #3   Title Pt will have 140 degrees of painfree left shoulder abduction so that she can easily perfom her ADLs at home    Status Deferred      PT LONG TERM GOAL #4   Title Patient will be Independent with HEP for shoulder ROM and strength    Status Achieved                 Plan - 09/22/19 1731    Clinical Impression Statement Pt has had no increase in arm circumfernce size while wearing the medium farrow wrap and was able to apply it along with compression glove today.  She says she is still having pain in her upper arm but it feels better when the compression is applies.  She says she knows what exercises she should do at home. Pt does not want to continue PT at this time despite that she still has symptoms.  She wants to continue to wear her compression at home and do her exercises. She will contact Dr. Irene Limbo in the future if she wants to return for more PT.  Will close this episode.    Personal Factors and Comorbidities Age    Comorbidities previous triple negataive left breast cancer, seroma after lumpectomy, CHF with defibrillator, Bilateral TKR osteoporosis    PT Next Visit Plan discharge    PT Home Exercise Plan Supine dowel exercises; supine scapular series; doorway stretch for abduction, standing rows seated theraband shoulder exercise           Patient will benefit from skilled therapeutic intervention in order to improve the following deficits and impairments:  Decreased activity tolerance, Decreased knowledge of precautions, Decreased knowledge of use  of DME, Decreased range of motion, Decreased strength, Increased fascial restricitons, Increased edema, Impaired perceived functional ability, Increased muscle spasms, Impaired UE functional use, Postural dysfunction, Pain  Visit Diagnosis: Postmastectomy lymphedema  Acute pain of left shoulder  Aftercare following surgery for neoplasm  Stiffness of left shoulder joint     Problem List Patient Active Problem List   Diagnosis Date Noted  . Recurrent major depressive disorder, in partial remission ([place]) 07/17/2019  . Status post left mastectomy 07/01/2019  . Breast cancer of lower-outer quadrant of left female breast ([place]) 06/04/2019  . Candida infection, oral 01/15/2019  . Senile purpura ([place]) 04/14/2018  . Lumbar post-laminectomy syndrome 12/03/2017  . Lumbar spondylosis 12/03/2017  . History of back surgery 09/01/2017  . Constipation 09/01/2017  . Hypothyroidism due to acquired atrophy of thyroid 09/01/2017  . Mixed hyperlipidemia 09/01/2017  . Age-related osteoporosis without current pathological fracture 09/01/2017  . High risk medication use 09/01/2017  . Arthritis of hand 05/17/2017  . Atherosclerosis of native arteries of extremity with intermittent claudication ([place]) 05/15/2017  .  Status post total bilateral knee replacement 12/20/2016  . Gait abnormality 07/16/2016  . Chronic low back pain 07/16/2016  . Mild cognitive impairment 07/16/2016  . Wrist pain 05/10/2015  . S/P left TKA 11/29/2014  . S/P knee replacement 11/29/2014  . Spontaneous bruising 08/27/2014  . Numbness and tingling in right hand 07/28/2014  . Essential tremor 07/28/2014  . Dizziness 05/28/2014  . Shaky 05/28/2014  . Memory loss 05/28/2014  . Depression 05/06/2014  . Lipoma of left upper thigh 3x5 cm 09/28/2011  . Cerebral vascular accident ([place]) 08/09/2011  . Syncope 08/09/2011  . History of breast cancer T1bNxMx, s/p BCT 2008, triple negative 01/26/2011  . Chronic L breast pain with  chronic recurrent seroma, s/p excisional biopsy 01/12/2010 01/26/2011  . Fainted   . ICD (implantable cardioverter-defibrillator), biventricular, in situ 08/02/2010  . Chronic systolic heart failure ([place]) 08/02/2010  . Hypertension 08/02/2010    PHYSICAL THERAPY DISCHARGE SUMMARY  Visits from Start of Care: 18  Current functional level related to goals / functional outcomes: Pt is independent in self care    Remaining deficits: Pain in left upper arm   Education / Equipment: Home exercise, use of compression garments  Plan: Patient agrees to discharge.  Patient goals were met. Patient is being discharged due to meeting the stated rehab goals.  ?????       Donato Heinz. Owens Shark PT  Norwood Levo 09/22/2019, 5:35 PM  [place], [place], [address] Phone: [phone]   Fax:  [phone]  Name: [name] MRN: [id number] Date of Birth: 22-Jan-1934

## 2019-10-06 ENCOUNTER — Other Ambulatory Visit: Payer: Self-pay

## 2019-10-06 NOTE — Telephone Encounter (Signed)
According to Ms. Shariyah the pharmacy is requesting to change this cream because in the past she was getting 2 and her now her insurance company will not cover that anymore, they will cover the new one which is Diclofenac/cylobenzaprine/lamotrigine/likocaine/prilocaine(10480) 2%2%6%5%/1.25 cream. 120 gm. I will send to [facility] Mustache for approval

## 2019-10-06 NOTE — Telephone Encounter (Signed)
Who was originally prescribing this medication? It does not look like we have prescribed it before.

## 2019-10-07 MED ORDER — NONFORMULARY OR COMPOUNDED ITEM: 120.0000 | Freq: Every day | 2 refills | Status: DC

## 2019-10-07 NOTE — Telephone Encounter (Signed)
We can refill.

## 2019-10-07 NOTE — Telephone Encounter (Signed)
RX printed and I manually faxed

## 2019-10-07 NOTE — Telephone Encounter (Addendum)
I called the compounding pharmacy to see who prescribed this and she said Dr. Etheleen Nicks prescribed the medication, last time in 2020, but we refilled it as far back as 2019. Do you want me to refuse and have Dr. Ronnie Derby continue to prescribe if needed?

## 2019-11-05 ENCOUNTER — Ambulatory Visit (INDEPENDENT_AMBULATORY_CARE_PROVIDER_SITE_OTHER): Payer: Medicare Other | Admitting: *Deleted

## 2019-11-05 DIAGNOSIS — I428 Other cardiomyopathies: Secondary | ICD-10-CM

## 2019-11-05 DIAGNOSIS — I5022 Chronic systolic (congestive) heart failure: Secondary | ICD-10-CM | POA: Diagnosis not present

## 2019-11-05 LAB — CUP PACEART REMOTE DEVICE CHECK
Battery Remaining Longevity: 42 mo
Battery Remaining Percentage: 67 %
Brady Statistic RA Percent Paced: 0 %
Brady Statistic RV Percent Paced: 100 %
Date Time Interrogation Session: 20210729080200
HighPow Impedance: 49 Ohm
Implantable Lead Implant Date: 20080116
Implantable Lead Implant Date: 20080116
Implantable Lead Implant Date: 20080116
Implantable Lead Location: 753859
Implantable Lead Location: 753860
Implantable Lead Location: 753860
Implantable Lead Model: 157
Implantable Lead Model: 4469
Implantable Lead Model: 4555
Implantable Lead Serial Number: 136532
Implantable Lead Serial Number: 161542
Implantable Lead Serial Number: 473495
Implantable Pulse Generator Implant Date: 20140911
Lead Channel Impedance Value: 397 Ohm
Lead Channel Impedance Value: 543 Ohm
Lead Channel Impedance Value: 914 Ohm
Lead Channel Pacing Threshold Amplitude: 0.6 V
Lead Channel Pacing Threshold Amplitude: 0.7 V
Lead Channel Pacing Threshold Amplitude: 0.8 V
Lead Channel Pacing Threshold Pulse Width: 0.4 ms
Lead Channel Pacing Threshold Pulse Width: 0.4 ms
Lead Channel Pacing Threshold Pulse Width: 0.8 ms
Lead Channel Setting Pacing Amplitude: 2 V
Lead Channel Setting Pacing Amplitude: 2 V
Lead Channel Setting Pacing Amplitude: 2.4 V
Lead Channel Setting Pacing Pulse Width: 0.4 ms
Lead Channel Setting Pacing Pulse Width: 0.8 ms
Lead Channel Setting Sensing Sensitivity: 0.6 mV
Lead Channel Setting Sensing Sensitivity: 1 mV
Pulse Gen Serial Number: 111765

## 2019-11-06 ENCOUNTER — Other Ambulatory Visit: Payer: Self-pay

## 2019-11-06 ENCOUNTER — Ambulatory Visit: Payer: Medicare Other

## 2019-11-06 ENCOUNTER — Ambulatory Visit (INDEPENDENT_AMBULATORY_CARE_PROVIDER_SITE_OTHER): Payer: Medicare Other | Admitting: Nurse Practitioner

## 2019-11-06 ENCOUNTER — Encounter: Payer: Self-pay | Admitting: Nurse Practitioner

## 2019-11-06 VITALS — BP 138/76 | HR 62 | Temp 96.9°F | Ht 60.0 in | Wt 121.0 lb

## 2019-11-06 DIAGNOSIS — M810 Age-related osteoporosis without current pathological fracture: Secondary | ICD-10-CM | POA: Diagnosis not present

## 2019-11-06 DIAGNOSIS — I5022 Chronic systolic (congestive) heart failure: Secondary | ICD-10-CM | POA: Diagnosis not present

## 2019-11-06 DIAGNOSIS — R42 Dizziness and giddiness: Secondary | ICD-10-CM

## 2019-11-06 DIAGNOSIS — M545 Low back pain, unspecified: Secondary | ICD-10-CM

## 2019-11-06 DIAGNOSIS — R269 Unspecified abnormalities of gait and mobility: Secondary | ICD-10-CM

## 2019-11-06 DIAGNOSIS — Z853 Personal history of malignant neoplasm of breast: Secondary | ICD-10-CM

## 2019-11-06 DIAGNOSIS — E034 Atrophy of thyroid (acquired): Secondary | ICD-10-CM | POA: Diagnosis not present

## 2019-11-06 DIAGNOSIS — Z66 Do not resuscitate: Secondary | ICD-10-CM

## 2019-11-06 DIAGNOSIS — E782 Mixed hyperlipidemia: Secondary | ICD-10-CM

## 2019-11-06 DIAGNOSIS — R413 Other amnesia: Secondary | ICD-10-CM

## 2019-11-06 DIAGNOSIS — F3341 Major depressive disorder, recurrent, in partial remission: Secondary | ICD-10-CM

## 2019-11-06 DIAGNOSIS — I10 Essential (primary) hypertension: Secondary | ICD-10-CM

## 2019-11-06 DIAGNOSIS — G8929 Other chronic pain: Secondary | ICD-10-CM

## 2019-11-06 LAB — BASIC METABOLIC PANEL WITH GFR
BUN/Creatinine Ratio: 36 (calc) — ABNORMAL HIGH (ref 6–22)
BUN: 30 mg/dL — ABNORMAL HIGH (ref 7–25)
CO2: 28 mmol/L (ref 20–32)
Calcium: 10.4 mg/dL (ref 8.6–10.4)
Chloride: 106 mmol/L (ref 98–110)
Creat: 0.83 mg/dL (ref 0.60–0.88)
GFR, Est African American: 76 mL/min/{1.73_m2} (ref >=60)
GFR, Est Non African American: 65 mL/min/{1.73_m2} (ref >=60)
Glucose, Bld: 126 mg/dL (ref 65–139)
Potassium: 3.9 mmol/L (ref 3.5–5.3)
Sodium: 140 mmol/L (ref 135–146)

## 2019-11-06 MED ORDER — DENOSUMAB 60 MG/ML ~~LOC~~ SOSY
60.0000 mg | PREFILLED_SYRINGE | Freq: Once | SUBCUTANEOUS | Status: AC
  Administered 2019-11-06: 60 mg via SUBCUTANEOUS

## 2019-11-06 NOTE — Progress Notes (Signed)
Careteam: Patient Care Team: [name], NP as PCP - General (Geriatric Medicine) [name], MD as Consulting Physician (General Surgery) Paralee Cancel, MD as Consulting Physician (Orthopedic Surgery) Marica Otter, [place] (Optometry) Marcial Pacas, MD as Consulting Physician (Neurology) [name], MD as Consulting Physician (Cardiology) Ricard Dillon, MD (Psychiatry)  PLACE OF SERVICE:  [place] Directive information Does Patient Have a Medical Advance Directive?: Yes, Type of Advance Directive: Living will;Out of facility DNR (pink MOST or yellow form), Pre-existing out of facility DNR order (yellow form or pink MOST form): Yellow form placed in chart (order not valid for inpatient use), Does patient want to make changes to medical advance directive?: No - Patient declined  Allergies  Allergen Reactions  . Iodine Shortness Of Breath    Iodine contrast, CHF , SOB  . Shellfish Allergy Shortness Of Breath  . Memantine     Malaise, fogginess/ couldn't think  . Aspirin Nausea And Vomiting  . Codeine Nausea And Vomiting    Chief Complaint  Patient presents with  . Medical Management of Chronic Issues    4 month follow-up   . Nocturia    On average patient wakes up 3-5 times nightly to use the bathroom.   . Gait Problem    Patient c/o balance issues  . Injections    Prolia injection   . Depression    Scored 11 on PHQ9   . Advanced Directive    Re-activate DNR      HPI: Patient is a 87 y.o. female for follow up  Reports she is having some depression because she is thinking about her late husband. Has not contacted anyone recently. Taking wellbutrin 450 mg daily and zoloft 100 mg daily. Also sees psychiatrist.  No thoughts of HI or SI.   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   Shoulder pain- ongoing, does exercises and massages area  Vertigo- on and off- has been good the last few months. Feels like  her gait is unsteady.   GERD- controlled on omeprazole.   Memory loss- having trouble with word finding. Stops in the middle of a sentence and does not remember what she was staying to continue.   Has not been driving but would like to   Hypertension- blood pressure stable.   Altered gait and balance issues, hx of chronic back pain with injections however injections the last time were not beneficial so she stopped having them done. Uses tylenol with lidocaine patches  Review of Systems:  Review of Systems  Constitutional: Negative for chills, fever and weight loss.  HENT: Positive for hearing loss.   Respiratory: Negative for cough, sputum production and shortness of breath.   Cardiovascular: Negative for chest pain, palpitations and leg swelling.  Gastrointestinal: Positive for constipation. Negative for abdominal pain, diarrhea and heartburn.  Genitourinary: Negative for dysuria, frequency and urgency.  Musculoskeletal: Positive for back pain. Negative for falls, joint pain and myalgias.  Skin: Negative.   Neurological: Positive for dizziness and weakness. Negative for headaches.  Psychiatric/Behavioral: Positive for depression. Negative for memory loss. The patient does not have insomnia.     Past Medical History:  Diagnosis Date  . Arthritis   . Cancer [facility])    left breast cancer   . Cataracts, bilateral    removed by surgery  . CHF (congestive heart failure) ([place])    PACEMAKER & DEFIB  . Complication of anesthesia  hypotensive after back surgery in 2006  . Depression   . Dyslipidemia   . Fainted 04/21/06   AT [place]  . GERD (gastroesophageal reflux disease)   . Headache(784.0)   . Hearing loss    bilateral hearing aids  . HLD (hyperlipidemia)    diet controlled   . Hypertension   . Hypothyroidism   . ICD (implantable cardiac defibrillator) in place    pt has pacer/icd  . ICD (implantable cardiac defibrillator), biventricular, in situ   . LBBB (left bundle  branch block)   . Memory loss   . Nonischemic cardiomyopathy ([place])   . Normal coronary arteries    s/p cardiac cath 2007  . Pacemaker    ICD [employer]  . Syncope   . Systolic CHF ([place])   . Vertigo   . Wears glasses    Past Surgical History:  Procedure Laterality Date  . BACK SURGERY     lumbar fusion   . BREAST LUMPECTOMY Left 2008  . BREAST SURGERY  2000   LUMP REMOVAL. STAGE 1 CANCER  . CARDIAC CATHETERIZATION    . CATARACT EXTRACTION    . COLONOSCOPY    . EYE SURGERY    . IMPLANTABLE CARDIOVERTER DEFIBRILLATOR GENERATOR CHANGE N/A 12/18/2012   Procedure: IMPLANTABLE CARDIOVERTER DEFIBRILLATOR GENERATOR CHANGE;  Surgeon: [name], MD;  Location: [facility] CATH LAB;  Service: Cardiovascular;  Laterality: N/A;  . JOINT REPLACEMENT  06/14/01   right  . LUMBAR FUSION  2006  . MASS EXCISION  11/08/2011   Procedure: EXCISION MASS;  Surgeon: [name], MD;  Location: [facility];  Service: General;  Laterality: Left;  Excision Left Thigh Mass  . MASTECTOMY W/ SENTINEL NODE BIOPSY Left 06/04/2019   Procedure: LEFT MASTECTOMY WITH SENTINEL LYMPH NODE BIOPSY;  Surgeon: [name], MD;  Location: [place];  Service: General;  Laterality: Left;  Marland Kitchen MASTECTOMY, PARTIAL  2008   GOT PACEMAKER AND DEFIB AT THAT TIME  . PACEMAKER INSERTION  04/23/06  . TOTAL KNEE ARTHROPLASTY  05/17/01   RIGHT KNEE  . TOTAL KNEE ARTHROPLASTY Left 11/29/2014   Procedure: TOTAL LEFT KNEE ARTHROPLASTY;  Surgeon: Paralee Cancel, MD;  Location: [facility];  Service: Orthopedics;  Laterality: Left;   Social History:   reports that she has never smoked. She has never used smokeless tobacco. She reports that she does not drink alcohol and does not use drugs.  Family History  Problem Relation Age of Onset  . Hypertension Mother   . Arthritis Mother   . Hypertension Father   . Hypertension Brother   . Hypertension Brother     Medications: Patient's Medications  New Prescriptions   No medications on file  Previous  Medications   ACETAMINOPHEN (TYLENOL) 650 MG CR TABLET    Take 650 mg by mouth every 8 (eight) hours as needed for pain.   AMLODIPINE (NORVASC) 5 MG TABLET    TAKE 1 TABLET BY MOUTH DAILY.   ASCORBIC ACID (VITAMIN C) 500 MG TABLET    Take 500 mg by mouth daily.   BUPROPION (WELLBUTRIN XL) 150 MG 24 HR TABLET    Take 3 tablets (450 mg total) by mouth daily.   CARVEDILOL (COREG) 3.125 MG TABLET    TAKE 1 TABLET BY MOUTH  TWICE DAILY   CHOLECALCIFEROL (VITAMIN D3) 50 MCG (2000 UT) TABS    Take 2,000 Units by mouth daily.    COD LIVER OIL CAPS    Take 1 capsule by mouth daily. [place]  mg   FUROSEMIDE (LASIX) 20 MG TABLET    TAKE 1 TABLET BY MOUTH  DAILY   LEVOTHYROXINE (SYNTHROID, LEVOTHROID) 100 MCG TABLET    Take 1 tablet (100 mcg total) by mouth daily before breakfast.   MAGNESIUM 250 MG TABS    Take 250 mg by mouth daily.   MECLIZINE (ANTIVERT) 12.5 MG TABLET    Take 1 tablet (12.5 mg total) by mouth 3 (three) times daily as needed for dizziness.   METHOCARBAMOL (ROBAXIN) 500 MG TABLET    Take 1 tablet (500 mg total) by mouth every 8 (eight) hours as needed for muscle spasms.   NONFORMULARY OR COMPOUNDED ITEM    Apply 120 Tubes topically daily. Diclofenac/Cyclobenzaprine/lamotrigine/lidocaine/prilocaine (10480) 2%/2%/6%/5%/1.25% cream QTY: 120 GM SIG: (NEURO) APPLY 1-2 PUMPS (1-2 GMS) TO AFFECTED AREA (S) OR FOCAL POINTS 3 TO 4 TIMES DAILY. RUB IN FOR 2 MINUTES TO ACHIEVE MAX PENETRATION. [place] HANDS WELL.   OMEPRAZOLE (PRILOSEC) 40 MG CAPSULE    TAKE 1 CAPSULE BY MOUTH  ONCE DAILY FOR STOMACH   OXCARBAZEPINE (TRILEPTAL) 300 MG TABLET    Take 300 mg by mouth daily.    POTASSIUM CHLORIDE (KLOR-CON) 10 MEQ TABLET    TAKE 1 TABLET BY MOUTH  DAILY   SERTRALINE (ZOLOFT) 100 MG TABLET    Take 200 mg by mouth daily.    SPIRONOLACTONE (ALDACTONE) 25 MG TABLET    TAKE 1 TABLET BY MOUTH IN  THE MORNING   VITAMIN E 400 UNITS TABS    Take 400 Units by mouth daily.   Modified Medications   No medications on  file  Discontinued Medications   No medications on file    Physical Exam:  Vitals:   11/06/19 1445  BP: (!) 138/76  Pulse: 62  Temp: (!) 96.9 F (36.1 C)  TempSrc: Temporal  SpO2: 96%  Weight: 121 lb (54.9 kg)  Height: 5' (1.524 m)   Body mass index is 23.63 kg/m. Wt Readings from Last 3 Encounters:  11/06/19 121 lb (54.9 kg)  08/24/19 120 lb (54.4 kg)  07/23/19 121 lb (54.9 kg)    Physical Exam Constitutional:      General: She is not in acute distress.    Appearance: She is well-developed. She is not diaphoretic.  HENT:     Head: Normocephalic and atraumatic.  Eyes:     Conjunctiva/sclera: Conjunctivae normal.     Pupils: Pupils are equal, round, and reactive to light.  Cardiovascular:     Rate and Rhythm: Normal rate and regular rhythm.     Heart sounds: Normal heart sounds.  Pulmonary:     Effort: Pulmonary effort is normal.     Breath sounds: Normal breath sounds.  Abdominal:     General: Bowel sounds are normal.     Palpations: Abdomen is soft.  Musculoskeletal:        General: No tenderness.     Cervical back: Normal range of motion and neck supple.  Skin:    General: Skin is warm and dry.  Neurological:     Mental Status: She is alert and oriented to person, place, and time. Mental status is at baseline.     Deep Tendon Reflexes: Reflexes normal.  Psychiatric:        Mood and Affect: Mood normal.     Labs reviewed: Basic Metabolic Panel: Recent Labs    11/10/18 0904 04/15/19 1119 06/01/19 1221 06/05/19 0342 07/17/19 1621  NA 143   < > 140 140 140  K 4.3   < > 4.1 3.8 4.5  CL 105   < > 103 105 106  CO2 29   < > 28 27 27   GLUCOSE 96   < > 85 91 100  BUN 27*   < > 24* 10 31*  CREATININE 0.87   < > 0.92 0.89 0.83  CALCIUM 10.3   < > 10.3 8.6* 10.2  MG  --   --   --  2.1  --   PHOS  --   --   --  1.4*  --   TSH 1.88  --   --   --  1.52   < > = values in this interval not displayed.   Liver Function Tests: Recent Labs     04/15/19 1119 06/01/19 1221 07/17/19 1621  AST 28 30 20   ALT 25 26 17   ALKPHOS  --  54  --   BILITOT 0.4 0.5 0.2  PROT 6.7 6.9 6.6  ALBUMIN  --  3.7  --    No results for input(s): LIPASE, AMYLASE in the last 8760 hours. No results for input(s): AMMONIA in the last 8760 hours. CBC: Recent Labs    04/15/19 1119 06/01/19 1221 07/17/19 1621  WBC 7.8 7.0 6.6  NEUTROABS 5,343 4.4 3,663  HGB 13.1 13.7 12.5  HCT 40.0 43.6 37.9  MCV 84.7 90.1 87.7  PLT 121* 124* 141   Lipid Panel: Recent Labs    11/10/18 0904  CHOL 182  HDL 60  LDLCALC 104*  TRIG 88  CHOLHDL 3.0   TSH: Recent Labs    11/10/18 0904 07/17/19 1621  TSH 1.88 1.52   A1C: No results found for: HGBA1C   Assessment/Plan 1. DNR (do not resuscitate) - Do not attempt resuscitation (DNR)  2. Essential hypertension -well controlled on norvasc and coreg with lasix and aldactone. Takes potassium supplement. - BASIC METABOLIC PANEL WITH GFR  3. Chronic systolic heart failure (HCC) Stable on coreg with lasix and aldactone (with potassium supplement) no increase in edema, shortness of breath or cough.  4. Hypothyroidism due to acquired atrophy of thyroid TSH at goal on synthroid 100 mcg daily  5. Age-related osteoporosis without current pathological fracture -continues on cal and vit d with weight bearing activity  - denosumab (PROLIA) injection 60 mg  6. Recurrent major depressive disorder, in partial remission ([place]) Followed by psych, recently worse due to thinking about her late husband, their anniversary was this month. Encouraged her to reach out to grief counselor.   7. Mixed hyperlipidemia Not on statin, daughter working on dietary modifications.   8. Memory loss -ongoing. Gets frustrated when she can not think of words. Will get home health ST to help with word finding and cognitive training. Encouraged her not to drive due to progressive memory loss. - Ambulatory referral to [place].  Altered gait -due to balance issues with dizziness and chronic back pain.  - Ambulatory referral to Home Health  10. History of breast cancer [id number], s/p BCT 2008, triple negative Completed PT with lymphemia clinic, improvement in left arm swelling.   11. Dizziness Stable at thi stime. - Ambulatory referral to [place]. Chronic right-sided low back pain without sciatica Ongoing, likely effecting gait the most due to pain. - Ambulatory referral to [place] for evaluation.  Next appt: 4 months for routine follow up. Carlos American. [place], [facility] [phone]

## 2019-11-06 NOTE — Patient Instructions (Signed)
To reach out to grief counselor   Try to get most of your water intake earlier in the day  Can use miralax 17 gm daily (mild laxative) daily to help with constipation- back off to every other if stool loose   Constipation, Adult Constipation is when a person has fewer bowel movements in a week than normal, has difficulty having a bowel movement, or has stools that are dry, hard, or larger than normal. Constipation may be caused by an underlying condition. It may become worse with age if a person takes certain medicines and does not take in enough fluids. Follow these instructions at home: Eating and drinking   Eat foods that have a lot of fiber, such as fresh fruits and vegetables, whole grains, and beans.  Limit foods that are high in fat, low in fiber, or overly processed, such as french fries, hamburgers, cookies, candies, and soda.  Drink enough fluid to keep your urine clear or pale yellow. General instructions  Exercise regularly or as told by your health care provider.  Go to the restroom when you have the urge to go. Do not hold it in.  Take over-the-counter and prescription medicines only as told by your health care provider. These include any fiber supplements.  Practice pelvic floor retraining exercises, such as deep breathing while relaxing the lower abdomen and pelvic floor relaxation during bowel movements.  Watch your condition for any changes.  Keep all follow-up visits as told by your health care provider. This is important. Contact a health care provider if:  You have pain that gets worse.  You have a fever.  You do not have a bowel movement after 4 days.  You vomit.  You are not hungry.  You lose weight.  You are bleeding from the anus.  You have thin, pencil-like stools. Get help right away if:  You have a fever and your symptoms suddenly get worse.  You leak stool or have blood in your stool.  Your abdomen is bloated.  You have severe pain  in your abdomen.  You feel dizzy or you faint. This information is not intended to replace advice given to you by your health care provider. Make sure you discuss any questions you have with your health care provider. Document Revised: 03/08/2017 Document Reviewed: 09/14/2015 Elsevier Patient Education  2020 [employer].

## 2019-11-09 NOTE — Progress Notes (Signed)
Remote ICD transmission.   

## 2019-11-17 ENCOUNTER — Telehealth: Payer: Self-pay | Admitting: Hematology

## 2019-11-17 NOTE — Telephone Encounter (Signed)
Rescheduled appt per 8/10 sch message. Pt is aware of appt time and date.

## 2019-11-23 ENCOUNTER — Ambulatory Visit: Payer: Medicare Other | Admitting: Nurse Practitioner

## 2019-11-29 ENCOUNTER — Other Ambulatory Visit: Payer: Self-pay | Admitting: Nurse Practitioner

## 2019-11-30 NOTE — Telephone Encounter (Signed)
High risk or very high risk warning populated when attempting to refill medication. RX request sent to PCP for review and approval if warranted.   

## 2019-12-01 ENCOUNTER — Telehealth: Payer: Self-pay

## 2019-12-01 NOTE — Telephone Encounter (Signed)
[place] Notified

## 2019-12-01 NOTE — Telephone Encounter (Signed)
[name] from [facility] would like a verbal for [name] to have PT for approximately 8-10 visits because of a recent fall. Pt is to help with her balance and strength training. Please advise.

## 2019-12-01 NOTE — Telephone Encounter (Signed)
Okay to give order for PT

## 2019-12-02 ENCOUNTER — Other Ambulatory Visit: Payer: Self-pay | Admitting: Nurse Practitioner

## 2019-12-02 DIAGNOSIS — I10 Essential (primary) hypertension: Secondary | ICD-10-CM

## 2019-12-02 MED ORDER — AMLODIPINE BESYLATE 5 MG PO TABS: 5.0000 mg | ORAL_TABLET | Freq: Every day | ORAL | 1 refills | Status: DC

## 2019-12-21 ENCOUNTER — Other Ambulatory Visit: Payer: Medicare Other

## 2019-12-21 ENCOUNTER — Ambulatory Visit: Payer: Medicare Other | Admitting: Hematology

## 2019-12-23 ENCOUNTER — Inpatient Hospital Stay: Payer: Medicare Other | Attending: Hematology

## 2019-12-23 ENCOUNTER — Inpatient Hospital Stay: Payer: Medicare Other

## 2019-12-23 ENCOUNTER — Telehealth: Payer: Self-pay | Admitting: Hematology

## 2019-12-23 ENCOUNTER — Other Ambulatory Visit: Payer: Self-pay

## 2019-12-23 ENCOUNTER — Inpatient Hospital Stay (HOSPITAL_BASED_OUTPATIENT_CLINIC_OR_DEPARTMENT_OTHER): Payer: Medicare Other | Admitting: Hematology

## 2019-12-23 VITALS — BP 115/86 | HR 78 | Temp 96.6°F | Resp 18 | Ht 60.0 in | Wt 120.4 lb

## 2019-12-23 DIAGNOSIS — F339 Major depressive disorder, recurrent, unspecified: Secondary | ICD-10-CM | POA: Insufficient documentation

## 2019-12-23 DIAGNOSIS — C50512 Malignant neoplasm of lower-outer quadrant of left female breast: Secondary | ICD-10-CM | POA: Insufficient documentation

## 2019-12-23 DIAGNOSIS — M549 Dorsalgia, unspecified: Secondary | ICD-10-CM | POA: Diagnosis not present

## 2019-12-23 DIAGNOSIS — Z8261 Family history of arthritis: Secondary | ICD-10-CM | POA: Diagnosis not present

## 2019-12-23 DIAGNOSIS — M7989 Other specified soft tissue disorders: Secondary | ICD-10-CM | POA: Insufficient documentation

## 2019-12-23 DIAGNOSIS — Z170 Estrogen receptor positive status [ER+]: Secondary | ICD-10-CM | POA: Insufficient documentation

## 2019-12-23 DIAGNOSIS — G8929 Other chronic pain: Secondary | ICD-10-CM | POA: Insufficient documentation

## 2019-12-23 DIAGNOSIS — C50912 Malignant neoplasm of unspecified site of left female breast: Secondary | ICD-10-CM | POA: Diagnosis not present

## 2019-12-23 DIAGNOSIS — I5020 Unspecified systolic (congestive) heart failure: Secondary | ICD-10-CM | POA: Insufficient documentation

## 2019-12-23 DIAGNOSIS — E785 Hyperlipidemia, unspecified: Secondary | ICD-10-CM | POA: Insufficient documentation

## 2019-12-23 DIAGNOSIS — Z171 Estrogen receptor negative status [ER-]: Secondary | ICD-10-CM | POA: Diagnosis not present

## 2019-12-23 DIAGNOSIS — I890 Lymphedema, not elsewhere classified: Secondary | ICD-10-CM | POA: Diagnosis not present

## 2019-12-23 DIAGNOSIS — Z23 Encounter for immunization: Secondary | ICD-10-CM

## 2019-12-23 DIAGNOSIS — Z8249 Family history of ischemic heart disease and other diseases of the circulatory system: Secondary | ICD-10-CM | POA: Insufficient documentation

## 2019-12-23 DIAGNOSIS — E039 Hypothyroidism, unspecified: Secondary | ICD-10-CM | POA: Diagnosis not present

## 2019-12-23 DIAGNOSIS — Z79899 Other long term (current) drug therapy: Secondary | ICD-10-CM | POA: Diagnosis not present

## 2019-12-23 LAB — CBC WITH DIFFERENTIAL/PLATELET
Abs Immature Granulocytes: 0.03 10*3/uL (ref 0.00–0.07)
Basophils Absolute: 0.1 10*3/uL (ref 0.0–0.1)
Basophils Relative: 1 %
Eosinophils Absolute: 0.4 10*3/uL (ref 0.0–0.5)
Eosinophils Relative: 4 %
HCT: 39.1 % (ref 36.0–46.0)
Hemoglobin: 12.7 g/dL (ref 12.0–15.0)
Immature Granulocytes: 0 %
Lymphocytes Relative: 19 %
Lymphs Abs: 1.6 10*3/uL (ref 0.7–4.0)
MCH: 27.5 pg (ref 26.0–34.0)
MCHC: 32.5 g/dL (ref 30.0–36.0)
MCV: 84.8 fL (ref 80.0–100.0)
Monocytes Absolute: 0.9 10*3/uL (ref 0.1–1.0)
Monocytes Relative: 11 %
Neutro Abs: 5.4 10*3/uL (ref 1.7–7.7)
Neutrophils Relative %: 65 %
Platelets: 131 10*3/uL — ABNORMAL LOW (ref 150–400)
RBC: 4.61 MIL/uL (ref 3.87–5.11)
RDW: 15.1 % (ref 11.5–15.5)
WBC: 8.4 10*3/uL (ref 4.0–10.5)
nRBC: 0 % (ref 0.0–0.2)

## 2019-12-23 LAB — CMP (CANCER CENTER ONLY)
ALT: 24 U/L (ref 0–44)
AST: 26 U/L (ref 15–41)
Albumin: 3.7 g/dL (ref 3.5–5.0)
Alkaline Phosphatase: 69 U/L (ref 38–126)
Anion gap: 6 (ref 5–15)
BUN: 26 mg/dL — ABNORMAL HIGH (ref 8–23)
CO2: 29 mmol/L (ref 22–32)
Calcium: 10.1 mg/dL (ref 8.9–10.3)
Chloride: 104 mmol/L (ref 98–111)
Creatinine: 0.91 mg/dL (ref 0.44–1.00)
GFR, Est AFR Am: >60 mL/min (ref >60)
GFR, Estimated: 58 mL/min — ABNORMAL LOW (ref >60)
Glucose, Bld: 94 mg/dL (ref 70–99)
Potassium: 4.3 mmol/L (ref 3.5–5.1)
Sodium: 139 mmol/L (ref 135–145)
Total Bilirubin: 0.3 mg/dL (ref 0.3–1.2)
Total Protein: 7.6 g/dL (ref 6.5–8.1)

## 2019-12-23 LAB — VITAMIN D 25 HYDROXY (VIT D DEFICIENCY, FRACTURES): Vit D, 25-Hydroxy: 34.05 ng/mL (ref 30–100)

## 2019-12-23 NOTE — Progress Notes (Signed)
   Covid-19 Vaccination Clinic  Name:  [name]    MRN: [id number] DOB: 1933/05/10  12/23/2019  [name] was observed post Covid-19 immunization for 15 minutes without incident. She was provided with Vaccine Information Sheet and instruction to access the V-Safe system.   [name] was instructed to call 911 with any severe reactions post vaccine: Marland Kitchen Difficulty breathing  . Swelling of face and throat  . A fast heartbeat  . A bad rash all over body  . Dizziness and weakness

## 2019-12-23 NOTE — Telephone Encounter (Signed)
Scheduled appointment per 9/15 los. I gave patient updated calendar.

## 2019-12-23 NOTE — Progress Notes (Signed)
HEMATOLOGY/ONCOLOGY CLINIC NOTE  Date of Service: 12/23/2019  Patient Care Team: [name], NP as PCP - General (Geriatric Medicine) [name], MD as Consulting Physician (General Surgery) Paralee Cancel, MD as Consulting Physician (Orthopedic Surgery) Marica Otter, [place] (Optometry) Marcial Pacas, MD as Consulting Physician (Neurology) [name], MD as Consulting Physician (Cardiology) Ricard Dillon, MD (Psychiatry)  CHIEF COMPLAINTS/PURPOSE OF CONSULTATION:  Easily bruising Newly diagnosed breast cancer  HISTORY OF PRESENTING ILLNESS:   [name] is a wonderful 86 y.o. female who has been referred to [place] by Sherrie Mustache, NP for evaluation and management of easily bruising. The pt reports that she is doing well overall.   The pt reports that she has seen bruises on her arm, and denies bumping into things or any trauma. The pt notes that her bruises are solely located to her upper extremities. She denies concerns for bleeding in her joints, blood in the stools, blood in the urine, nose bleeds or gum bleeds. She notes she has bruised easily for about 2 years. She also notes that she began taking Zoloft about two years ago, and fish oil 4-5 years ago. She is not on any blood thinners. She denies taking any other new medications in the last couple years. The pt denies any thick bruises at any time. The pt denies excessive bleeding with pervious surgeries and dental extractions. The pt denies heavy periods when she was younger.  She has had two knee replacements and took Tramadol for her pain. She denies needing to use this frequently, and takes Tylenol for mild pains.    The pt takes Vitamin E oil for hot flashes, and notes that her Vitamin E oil successfully resolved her hot flashes. She began taking Vitamin E about 18 months ago.   The pt notes that she has lost about 20 pounds over two years. The pt reports some constipation and denies difficulty  swallowing, and weak appetite. The pt denies any dietary restrictions. She continues annual mammograms. She has a history of left sided breast cancer treated with a lumpectomy and radiation.  She notes that her energy levels have also decreased in the last 6 months, and "feels tired all the time." She endorses feeling well rested after taking naps. She notes that she does feel depressed but that taking Zoloft keeps her "afloat." She notes that she feels "medium" enjoyment in her activities. She sees psychiatry every 6 months.  The pt lives with her husband.   Most recent lab results (02/05/18) of CBC w/diff and CMP is as follows: all values are WNL except for PLT at 117k, BUN at 30, Creatinine at 0.96.  On review of systems, pt reports some stable depression, easily bruising on upper extremities, some weight loss, and denies nose bleeds, gum bleeds, blood in the urine, blood in the stools, abdominal pains, leg swelling, and any other symptoms.   On Family Hx the pt denies bleeding disorders.   INTERVAL HISTORY:   [name] is a wonderful 86 y.o. female who is here for evaluation and management of Stage 1B high-grade breast cancer. The patient's last visit with [place] was on 06/23/2019. The pt reports that she is doing well overall.  The pt reports that she went to the lymphedema clinic. She was given a sleeve that she continues to use as needed. Pt has seen much improvement in her left arm swelling. Pt endorses stiffness near the surgical site. She saw Dr. Barry Dienes yesterday who gave  pt a breast exam, which was normal, and prescribed her Robaxin. Pt notes that she was sleepy after starting Robaxin.   She continues to take Vitamin D as prescribed.   She has chronic back pain and occasional chest pressure. Pt has reflux that is being controlled by Prilosec. Pt does not associate chest pressure with any particular activity or food.   Lab results today (12/23/19) of CBC w/diff and CMP is as  follows: all values are WNL except for PLT at 131K, BUN at 26. 12/23/2019 Vitamin D 25 (OH) at 34.05  On review of systems, pt reports chronic back pain, left arm stiffness, occasional chest pressure and denies breast discomfort, left arm swelling, unexpected weight loss, abdominal pain and any other symptoms.   MEDICAL HISTORY:  Past Medical History:  Diagnosis Date  . Arthritis   . Cancer [facility])    left breast cancer   . Cataracts, bilateral    removed by surgery  . CHF (congestive heart failure) ([place])    PACEMAKER & DEFIB  . Complication of anesthesia    hypotensive after back surgery in 2006  . Depression   . Dyslipidemia   . Fainted 04/21/06   AT [place]  . GERD (gastroesophageal reflux disease)   . Headache(784.0)   . Hearing loss    bilateral hearing aids  . HLD (hyperlipidemia)    diet controlled   . Hypertension   . Hypothyroidism   . ICD (implantable cardiac defibrillator) in place    pt has pacer/icd  . ICD (implantable cardiac defibrillator), biventricular, in situ   . LBBB (left bundle branch block)   . Memory loss   . Nonischemic cardiomyopathy ([place])   . Normal coronary arteries    s/p cardiac cath 2007  . Pacemaker    ICD [employer]  . Syncope   . Systolic CHF ([place])   . Vertigo   . Wears glasses     SURGICAL HISTORY: Past Surgical History:  Procedure Laterality Date  . BACK SURGERY     lumbar fusion   . BREAST LUMPECTOMY Left 2008  . BREAST SURGERY  2000   LUMP REMOVAL. STAGE 1 CANCER  . CARDIAC CATHETERIZATION    . CATARACT EXTRACTION    . COLONOSCOPY    . EYE SURGERY    . IMPLANTABLE CARDIOVERTER DEFIBRILLATOR GENERATOR CHANGE N/A 12/18/2012   Procedure: IMPLANTABLE CARDIOVERTER DEFIBRILLATOR GENERATOR CHANGE;  Surgeon: [name], MD;  Location: [facility] CATH LAB;  Service: Cardiovascular;  Laterality: N/A;  . JOINT REPLACEMENT  06/14/01   right  . LUMBAR FUSION  2006  . MASS EXCISION  11/08/2011   Procedure: EXCISION MASS;  Surgeon: [name], MD;  Location: [facility] OR[facility];  Service: General;  Laterality: Left;  Excision Left Thigh Mass  . MASTECTOMY W/ SENTINEL NODE BIOPSY Left 06/04/2019   Procedure: LEFT MASTECTOMY WITH SENTINEL LYMPH NODE BIOPSY;  Surgeon: [name], MD;  Location: [facility];  Service: General;  Laterality: Left;  Marland Kitchen MASTECTOMY, PARTIAL  2008   GOT PACEMAKER AND DEFIB AT THAT TIME  . PACEMAKER INSERTION  04/23/06  . TOTAL KNEE ARTHROPLASTY  05/17/01   RIGHT KNEE  . TOTAL KNEE ARTHROPLASTY Left 11/29/2014   Procedure: TOTAL LEFT KNEE ARTHROPLASTY;  Surgeon: Paralee Cancel, MD;  Location: [facility];  Service: Orthopedics;  Laterality: Left;    SOCIAL HISTORY: Social History   Socioeconomic History  . Marital status: Married    Spouse name: Not on file  . Number of children: 1  .  Years of education: Masters  . Highest education level: Not on file  Occupational History  . Occupation: Retired  Tobacco Use  . Smoking status: Never Smoker  . Smokeless tobacco: Never Used  Vaping Use  . Vaping Use: Never used  Substance and Sexual Activity  . Alcohol use: No  . Drug use: No  . Sexual activity: Not Currently    Birth control/protection: Post-menopausal  Other Topics Concern  . Not on file  Social History Narrative   Lives at home with husband.   Right-handed.      As of 07/28/2014   Diet: No special diet   Caffeine: yes, Chocolate, tea and sodas    Married: YES, 1970   House: Yes, 2 stories, 2-3 persons live in home   Pets: No   Current/Past profession: Engineer, mining, Designer, jewellery    Exercise: Yes 2-3 x weekly   Living Will: Yes   DNR: No   POA/HPOA: No      Social Determinants of Radio broadcast assistant Strain:   . Difficulty of Paying Living Expenses: Not on file  Food Insecurity:   . Worried About Charity fundraiser in the Last Year: Not on file  . Ran Out of Food in the Last Year: Not on file  Transportation Needs:   . Lack of Transportation (Medical): Not on file  . Lack of  Transportation (Non-Medical): Not on file  Physical Activity:   . Days of Exercise per Week: Not on file  . Minutes of Exercise per Session: Not on file  Stress:   . Feeling of Stress : Not on file  Social Connections:   . Frequency of Communication with Friends and Family: Not on file  . Frequency of Social Gatherings with Friends and Family: Not on file  . Attends Religious Services: Not on file  . Active Member of Clubs or Organizations: Not on file  . Attends Archivist Meetings: Not on file  . Marital Status: Not on file  Intimate Partner Violence:   . Fear of Current or Ex-Partner: Not on file  . Emotionally Abused: Not on file  . Physically Abused: Not on file  . Sexually Abused: Not on file    FAMILY HISTORY: Family History  Problem Relation Age of Onset  . Hypertension Mother   . Arthritis Mother   . Hypertension Father   . Hypertension Brother   . Hypertension Brother     ALLERGIES:  is allergic to iodine, shellfish allergy, memantine, aspirin, and codeine.  MEDICATIONS:  Current Outpatient Medications  Medication Sig Dispense Refill  . acetaminophen (TYLENOL) 650 MG CR tablet Take 650 mg by mouth every 8 (eight) hours as needed for pain.    Marland Kitchen amLODipine (NORVASC) 5 MG tablet Take 1 tablet (5 mg total) by mouth daily. 90 tablet 1  . ascorbic acid (VITAMIN C) 500 MG tablet Take 500 mg by mouth daily.    Marland Kitchen buPROPion (WELLBUTRIN XL) 150 MG 24 hr tablet Take 3 tablets (450 mg total) by mouth daily. 270 tablet 3  . carvedilol (COREG) 3.125 MG tablet TAKE 1 TABLET BY MOUTH  TWICE DAILY 180 tablet 3  . Cholecalciferol (VITAMIN D3) 50 MCG (2000 UT) TABS Take 2,000 Units by mouth daily.     Marland Kitchen Cod Liver Oil CAPS Take 1 capsule by mouth daily. 415 mg    . furosemide (LASIX) 20 MG tablet TAKE 1 TABLET BY MOUTH  DAILY 90 tablet 1  . levothyroxine (  SYNTHROID, LEVOTHROID) 100 MCG tablet Take 1 tablet (100 mcg total) by mouth daily before breakfast. 90 tablet 3  .  Magnesium 250 MG TABS Take 250 mg by mouth daily.    . meclizine (ANTIVERT) 12.5 MG tablet Take 1 tablet (12.5 mg total) by mouth 3 (three) times daily as needed for dizziness. 30 tablet 1  . methocarbamol (ROBAXIN) 500 MG tablet Take 1 tablet (500 mg total) by mouth every 8 (eight) hours as needed for muscle spasms. 30 tablet 0  . NONFORMULARY OR COMPOUNDED ITEM Apply 120 Tubes topically daily. Diclofenac/Cyclobenzaprine/lamotrigine/lidocaine/prilocaine (10480) 2%/2%/6%/5%/1.25% cream QTY: 120 GM SIG: (NEURO) APPLY 1-2 PUMPS (1-2 GMS) TO AFFECTED AREA (S) OR FOCAL POINTS 3 TO 4 TIMES DAILY. RUB IN FOR 2 MINUTES TO ACHIEVE MAX PENETRATION. [place] HANDS WELL. 1 each 2  . omeprazole (PRILOSEC) 40 MG capsule TAKE 1 CAPSULE BY MOUTH  ONCE DAILY FOR STOMACH 90 capsule 1  . Oxcarbazepine (TRILEPTAL) 300 MG tablet Take 300 mg by mouth daily.     . potassium chloride (KLOR-CON) 10 MEQ tablet TAKE 1 TABLET BY MOUTH  DAILY 90 tablet 1  . sertraline (ZOLOFT) 100 MG tablet Take 200 mg by mouth daily.     Marland Kitchen spironolactone (ALDACTONE) 25 MG tablet TAKE 1 TABLET BY MOUTH IN  THE MORNING 90 tablet 1  . Vitamin E 400 units TABS Take 400 Units by mouth daily.      No current facility-administered medications for this visit.    REVIEW OF SYSTEMS:   A 10+ POINT REVIEW OF SYSTEMS WAS OBTAINED including neurology, dermatology, psychiatry, cardiac, respiratory, lymph, extremities, GI, GU, Musculoskeletal, constitutional, breasts, reproductive, HEENT.  All pertinent positives are noted in the HPI.  All others are negative.   PHYSICAL EXAMINATION  . Vitals:   12/23/19 0951  BP: 115/86  Pulse: 78  Resp: 18  Temp: (!) 96.6 F (35.9 C)  SpO2: 99%   Filed Weights   12/23/19 0951  Weight: 120 lb 6.4 oz (54.6 kg)   .Body mass index is 23.51 kg/m.  Exam was given in a chair   GENERAL:alert, in no acute distress and comfortable SKIN: no acute rashes, no significant lesions  EYES: conjunctiva are pink and  non-injected, sclera anicteric OROPHARYNX: MMM, no exudates, no oropharyngeal erythema or ulceration NECK: supple, no JVD LYMPH:  no palpable lymphadenopathy in the cervical, axillary or inguinal regions LUNGS: clear to auscultation b/l with normal respiratory effort HEART: regular rate & rhythm ABDOMEN:  normoactive bowel sounds , non tender, not distended. No palpable hepatosplenomegaly.  Extremity: no pedal edema PSYCH: alert & oriented x 3 with fluent speech NEURO: no focal motor/sensory deficits  LABORATORY DATA:  I have reviewed the data as listed  . CBC Latest Ref Rng & Units 12/23/2019 07/17/2019 06/01/2019  WBC 4.0 - 10.5 K/uL 8.4 6.6 7.0  Hemoglobin 12.0 - 15.0 g/dL 12.7 12.5 13.7  Hematocrit 36 - 46 % 39.1 37.9 43.6  Platelets 150 - 400 K/uL 131(L) 141 124(L)    . CMP Latest Ref Rng & Units 12/23/2019 11/06/2019 07/17/2019  Glucose 70 - 99 mg/dL 94 126 100  BUN 8 - 23 mg/dL 26(H) 30(H) 31(H)  Creatinine 0.44 - 1.00 mg/dL 0.91 0.83 0.83  Sodium 135 - 145 mmol/L 139 140 140  Potassium 3.5 - 5.1 mmol/L 4.3 3.9 4.5  Chloride 98 - 111 mmol/L 104 106 106  CO2 22 - 32 mmol/L 29 28 27   Calcium 8.9 - 10.3 mg/dL 10.1 10.4 10.2  Total Protein 6.5 - 8.1 g/dL 7.6 - 6.6  Total Bilirubin 0.3 - 1.2 mg/dL 0.3 - 0.2  Alkaline Phos 38 - 126 U/L 69 - -  AST 15 - 41 U/L 26 - 20  ALT 0 - 44 U/L 24 - 17     RADIOGRAPHIC STUDIES: I have personally reviewed the radiological images as listed and agreed with the findings in the report.  ASSESSMENT & PLAN:   86 y.o. female with  1. Easily bruising - labs did not demonstrate any specific bleeding diathesis 2. Newly diagnosed Stage IB ER/PR/Her 2 negative left breast cancer s/p mastectomy. negSNLBx 3. LUE lymphedema  PLAN: -Discussed pt labwork today, 12/23/19; blood counts and chemistries look good, Vitamin D is low-normal -Advised pt that Robaxin can cause drowsiness, which can put her at increased risk of falls.  -Advised pt that  lymphedema and stiffness may continue long-term after surgery, but should improve with time.  -Recommend pt repeat breast exam every 4-6 months and repeat Mammogram yearly. -Recommend pt continue to avoid BP cuffs and IV lines on her left arm.   -Recommend pt receive annual flu vaccine. Will seek at a local pharmacy. -Discussed the CDC guidelines regarding [place] booster. Will give in clinic today.  -Recommend pt discuss chest pressure with PCP -Continue 2000 IU Vitamin D daily  -Will see back in 6 months with labs  2)  Patient Active Problem List   Diagnosis Date Noted  . Recurrent major depressive disorder, in partial remission ([place]) 07/17/2019  . Status post left mastectomy 07/01/2019  . Breast cancer of lower-outer quadrant of left female breast ([place]) 06/04/2019  . Candida infection, oral 01/15/2019  . Senile purpura ([place]) 04/14/2018  . Lumbar post-laminectomy syndrome 12/03/2017  . Lumbar spondylosis 12/03/2017  . History of back surgery 09/01/2017  . Constipation 09/01/2017  . Hypothyroidism due to acquired atrophy of thyroid 09/01/2017  . Mixed hyperlipidemia 09/01/2017  . Age-related osteoporosis without current pathological fracture 09/01/2017  . High risk medication use 09/01/2017  . Arthritis of hand 05/17/2017  . Atherosclerosis of native arteries of extremity with intermittent claudication ([place]) 05/15/2017  . Status post total bilateral knee replacement 12/20/2016  . Gait abnormality 07/16/2016  . Chronic low back pain 07/16/2016  . Mild cognitive impairment 07/16/2016  . Wrist pain 05/10/2015  . S/P left TKA 11/29/2014  . S/P knee replacement 11/29/2014  . Spontaneous bruising 08/27/2014  . Numbness and tingling in right hand 07/28/2014  . Essential tremor 07/28/2014  . Dizziness 05/28/2014  . Shaky 05/28/2014  . Memory loss 05/28/2014  . Depression 05/06/2014  . Lipoma of left upper thigh 3x5 cm 09/28/2011  . Cerebral vascular accident ([place]) 08/09/2011  .  Syncope 08/09/2011  . History of breast cancer [id number], s/p BCT 2008, triple negative 01/26/2011  . Chronic L breast pain with chronic recurrent seroma, s/p excisional biopsy 01/12/2010 01/26/2011  . Fainted   . ICD (implantable cardioverter-defibrillator), biventricular, in situ 08/02/2010  . Chronic systolic heart failure ([place]) 08/02/2010  . Hypertension 08/02/2010   -continue f/u with PCP for mx of other chronic medical issues.  FOLLOW UP: Covid booster vaccine today-plz schedule covid vaccine clinic [facility] in December 2021 RTC with Dr Irene Limbo in 6 months with labs    The total time spent in the appt was 20 minutes and more than 50% was on counseling and direct patient cares.  All of the patient's questions were answered with apparent satisfaction. The patient knows to call the clinic with any  problems, questions or concerns.    Sullivan Lone MD [place] AAHIVMS [facility] Hematology/Oncology [facility]  (Office):       [phone] (Work cell):  [phone] (Fax):           [phone]  12/23/2019 1:23 PM  I, Yevette Edwards, am acting as a scribe for Dr. Sullivan Lone.   .I have reviewed the above documentation for accuracy and completeness, and I agree with the above. Brunetta Genera MD

## 2019-12-25 ENCOUNTER — Other Ambulatory Visit: Payer: Medicare Other

## 2019-12-25 ENCOUNTER — Ambulatory Visit: Payer: Medicare Other | Admitting: Hematology

## 2019-12-31 ENCOUNTER — Telehealth: Payer: Self-pay | Admitting: *Deleted

## 2019-12-31 NOTE — Telephone Encounter (Signed)
Monica with [facility] called requesting verbal order for Speech Therapy for cognition.  Verbal order given.

## 2020-01-12 ENCOUNTER — Other Ambulatory Visit: Payer: Self-pay

## 2020-01-12 ENCOUNTER — Encounter: Payer: Self-pay | Admitting: Nurse Practitioner

## 2020-01-12 ENCOUNTER — Ambulatory Visit (INDEPENDENT_AMBULATORY_CARE_PROVIDER_SITE_OTHER): Payer: Medicare Other | Admitting: Nurse Practitioner

## 2020-01-12 VITALS — BP 138/80 | HR 75 | Temp 96.9°F | Ht 60.0 in | Wt 122.2 lb

## 2020-01-12 DIAGNOSIS — M79661 Pain in right lower leg: Secondary | ICD-10-CM | POA: Diagnosis not present

## 2020-01-12 DIAGNOSIS — M7989 Other specified soft tissue disorders: Secondary | ICD-10-CM | POA: Diagnosis not present

## 2020-01-12 LAB — CBC WITH DIFFERENTIAL/PLATELET
Absolute Monocytes: 1047 cells/uL — ABNORMAL HIGH (ref 200–950)
Basophils Absolute: 55 cells/uL (ref 0–200)
Basophils Relative: 0.6 %
Eosinophils Absolute: 319 cells/uL (ref 15–500)
Eosinophils Relative: 3.5 %
HCT: 38 % (ref 35.0–45.0)
Hemoglobin: 12.7 g/dL (ref 11.7–15.5)
Lymphs Abs: 1765 cells/uL (ref 850–3900)
MCH: 28.5 pg (ref 27.0–33.0)
MCHC: 33.4 g/dL (ref 32.0–36.0)
MCV: 85.4 fL (ref 80.0–100.0)
MPV: 11.5 fL (ref 7.5–12.5)
Monocytes Relative: 11.5 %
Neutro Abs: 5915 cells/uL (ref 1500–7800)
Neutrophils Relative %: 65 %
Platelets: 129 10*3/uL — ABNORMAL LOW (ref 140–400)
RBC: 4.45 10*6/uL (ref 3.80–5.10)
RDW: 14.1 % (ref 11.0–15.0)
Total Lymphocyte: 19.4 %
WBC: 9.1 10*3/uL (ref 3.8–10.8)

## 2020-01-12 NOTE — Progress Notes (Signed)
Careteam: Patient Care Team: [name], NP as PCP - General (Geriatric Medicine) [name], MD as Consulting Physician (General Surgery) Paralee Cancel, MD as Consulting Physician (Orthopedic Surgery) Marica Otter, [place] (Optometry) Marcial Pacas, MD as Consulting Physician (Neurology) [name], MD as Consulting Physician (Cardiology) Ricard Dillon, MD (Psychiatry)  PLACE OF SERVICE:  [place]  Advanced Directive information    Allergies  Allergen Reactions  . Iodine Shortness Of Breath    Iodine contrast, CHF , SOB  . Shellfish Allergy Shortness Of Breath  . Memantine     Malaise, fogginess/ couldn't think  . Aspirin Nausea And Vomiting  . Codeine Nausea And Vomiting    Chief Complaint  Patient presents with  . Acute Visit    Raised areas on different locations of body x 2 weeks. Here with daughter Peggye Form      HPI: Patient is a 86 y.o. female who is being seen today for raised area on her right leg.  Reports it is not painful unless you press on area.  Reports there was another area to top left leg but can not find it now.  Right leg has been slightly more tender and swollen over the last 2 weeks.  She has hx of breast cancer followed by oncology, mild cognitive impairment, CHF, dizziness, hypothyroid, hypertension.   She has recently started going to PT for dizziness and gait instability.   Review of Systems:  Review of Systems  Constitutional: Negative for chills, fever, malaise/fatigue and weight loss.  HENT: Negative for tinnitus.   Respiratory: Negative for cough, sputum production and shortness of breath.   Cardiovascular: Positive for leg swelling. Negative for chest pain and palpitations.  Gastrointestinal: Negative for abdominal pain, constipation, diarrhea and heartburn.  Genitourinary: Negative for dysuria, frequency and urgency.  Musculoskeletal: Positive for myalgias. Negative for back pain, falls and joint pain.  Skin:  Negative for itching and rash.  Neurological: Positive for dizziness. Negative for tingling and headaches.  Psychiatric/Behavioral: Positive for memory loss. Negative for depression. The patient does not have insomnia.     Past Medical History:  Diagnosis Date  . Arthritis   . Cancer [facility])    left breast cancer   . Cataracts, bilateral    removed by surgery  . CHF (congestive heart failure) ([place])    PACEMAKER & DEFIB  . Complication of anesthesia    hypotensive after back surgery in 2006  . Depression   . Dyslipidemia   . Fainted 04/21/06   AT [place]  . GERD (gastroesophageal reflux disease)   . Headache(784.0)   . Hearing loss    bilateral hearing aids  . HLD (hyperlipidemia)    diet controlled   . Hypertension   . Hypothyroidism   . ICD (implantable cardiac defibrillator) in place    pt has pacer/icd  . ICD (implantable cardiac defibrillator), biventricular, in situ   . LBBB (left bundle branch block)   . Memory loss   . Nonischemic cardiomyopathy ([place])   . Normal coronary arteries    s/p cardiac cath 2007  . Pacemaker    ICD [employer]  . Syncope   . Systolic CHF ([facility])   . Vertigo   . Wears glasses    Past Surgical History:  Procedure Laterality Date  . BACK SURGERY     lumbar fusion   . BREAST LUMPECTOMY Left 2008  . BREAST SURGERY  2000   LUMP REMOVAL. STAGE 1 CANCER  . CARDIAC  CATHETERIZATION    . CATARACT EXTRACTION    . COLONOSCOPY    . EYE SURGERY    . IMPLANTABLE CARDIOVERTER DEFIBRILLATOR GENERATOR CHANGE N/A 12/18/2012   Procedure: IMPLANTABLE CARDIOVERTER DEFIBRILLATOR GENERATOR CHANGE;  Surgeon: [name], MD;  Location: [facility] CATH LAB;  Service: Cardiovascular;  Laterality: N/A;  . JOINT REPLACEMENT  06/14/01   right  . LUMBAR FUSION  2006  . MASS EXCISION  11/08/2011   Procedure: EXCISION MASS;  Surgeon: [name], MD;  Location: [facility];  Service: General;  Laterality: Left;  Excision Left Thigh Mass  . MASTECTOMY W/ SENTINEL NODE  BIOPSY Left 06/04/2019   Procedure: LEFT MASTECTOMY WITH SENTINEL LYMPH NODE BIOPSY;  Surgeon: [name], MD;  Location: [place];  Service: General;  Laterality: Left;  Marland Kitchen MASTECTOMY, PARTIAL  2008   GOT PACEMAKER AND DEFIB AT THAT TIME  . PACEMAKER INSERTION  04/23/06  . TOTAL KNEE ARTHROPLASTY  05/17/01   RIGHT KNEE  . TOTAL KNEE ARTHROPLASTY Left 11/29/2014   Procedure: TOTAL LEFT KNEE ARTHROPLASTY;  Surgeon: Paralee Cancel, MD;  Location: [facility];  Service: Orthopedics;  Laterality: Left;   Social History:   reports that she has never smoked. She has never used smokeless tobacco. She reports that she does not drink alcohol and does not use drugs.  Family History  Problem Relation Age of Onset  . Hypertension Mother   . Arthritis Mother   . Hypertension Father   . Hypertension Brother   . Hypertension Brother     Medications: Patient's Medications  New Prescriptions   No medications on file  Previous Medications   ACETAMINOPHEN (TYLENOL) 650 MG CR TABLET    Take 650 mg by mouth every 8 (eight) hours as needed for pain.   AMLODIPINE (NORVASC) 5 MG TABLET    Take 1 tablet (5 mg total) by mouth daily.   ASCORBIC ACID (VITAMIN C) 500 MG TABLET    Take 500 mg by mouth daily.   BUPROPION (WELLBUTRIN XL) 150 MG 24 HR TABLET    Take 3 tablets (450 mg total) by mouth daily.   CARVEDILOL (COREG) 3.125 MG TABLET    TAKE 1 TABLET BY MOUTH  TWICE DAILY   CHOLECALCIFEROL (VITAMIN D3) 50 MCG (2000 UT) TABS    Take 2,000 Units by mouth daily.    FUROSEMIDE (LASIX) 20 MG TABLET    TAKE 1 TABLET BY MOUTH  DAILY   LEVOTHYROXINE (SYNTHROID, LEVOTHROID) 100 MCG TABLET    Take 1 tablet (100 mcg total) by mouth daily before breakfast.   MAGNESIUM 250 MG TABS    Take 250 mg by mouth daily.   MECLIZINE (ANTIVERT) 12.5 MG TABLET    Take 1 tablet (12.5 mg total) by mouth 3 (three) times daily as needed for dizziness.   METHOCARBAMOL (ROBAXIN) 500 MG TABLET    Take 1 tablet (500 mg total) by mouth every 8  (eight) hours as needed for muscle spasms.   NONFORMULARY OR COMPOUNDED ITEM    Apply 120 Tubes topically daily. Diclofenac/Cyclobenzaprine/lamotrigine/lidocaine/prilocaine (10480) 2%/2%/6%/5%/1.25% cream QTY: 120 GM SIG: (NEURO) APPLY 1-2 PUMPS (1-2 GMS) TO AFFECTED AREA (S) OR FOCAL POINTS 3 TO 4 TIMES DAILY. RUB IN FOR 2 MINUTES TO ACHIEVE MAX PENETRATION. [place] HANDS WELL.   OMEGA-3 FATTY ACIDS (FISH OIL PO)    Take 1 capsule by mouth daily.   OMEPRAZOLE (PRILOSEC) 40 MG CAPSULE    TAKE 1 CAPSULE BY MOUTH  ONCE DAILY FOR STOMACH   OXCARBAZEPINE (  TRILEPTAL) 300 MG TABLET    Take 300 mg by mouth daily.    POTASSIUM CHLORIDE (KLOR-CON) 10 MEQ TABLET    TAKE 1 TABLET BY MOUTH  DAILY   SERTRALINE (ZOLOFT) 100 MG TABLET    Take 200 mg by mouth daily.    SPIRONOLACTONE (ALDACTONE) 25 MG TABLET    TAKE 1 TABLET BY MOUTH IN  THE MORNING   VITAMIN E 400 UNITS TABS    Take 400 Units by mouth daily.   Modified Medications   No medications on file  Discontinued Medications   COD LIVER OIL CAPS    Take 1 capsule by mouth daily. 415 mg    Physical Exam:  Vitals:   01/12/20 1545  BP: 138/80  Pulse: 75  Temp: (!) 96.9 F (36.1 C)  TempSrc: Temporal  SpO2: 97%  Weight: 122 lb 3.2 oz (55.4 kg)  Height: 5' (1.524 m)   Body mass index is 23.87 kg/m. Wt Readings from Last 3 Encounters:  01/12/20 122 lb 3.2 oz (55.4 kg)  12/23/19 120 lb 6.4 oz (54.6 kg)  11/06/19 121 lb (54.9 kg)    Physical Exam Constitutional:      General: She is not in acute distress.    Appearance: She is well-developed. She is not diaphoretic.  HENT:     Head: Normocephalic and atraumatic.     Mouth/Throat:     Pharynx: No oropharyngeal exudate.  Eyes:     Conjunctiva/sclera: Conjunctivae normal.     Pupils: Pupils are equal, round, and reactive to light.  Cardiovascular:     Rate and Rhythm: Normal rate and regular rhythm.     Heart sounds: Normal heart sounds.  Pulmonary:     Effort: Pulmonary effort is  normal.     Breath sounds: Normal breath sounds.  Abdominal:     General: Bowel sounds are normal.     Palpations: Abdomen is soft.  Musculoskeletal:     Cervical back: Normal range of motion and neck supple.       Legs:     Comments: Small tender firm area to right lower leg, she also has tenderness to calf.  1+ edema to right leg.    Skin:    General: Skin is warm and dry.  Neurological:     Mental Status: She is alert and oriented to person, place, and time. Mental status is at baseline.  Psychiatric:        Mood and Affect: Mood normal.        Behavior: Behavior normal.     Labs reviewed: Basic Metabolic Panel: Recent Labs    04/15/19 1119 06/05/19 0342 07/17/19 1621 11/06/19 1523 12/23/19 0859  NA   < > 140 140 140 139  K   < > 3.8 4.5 3.9 4.3  CL   < > 105 106 106 104  CO2   < > 27 27 28 29   GLUCOSE   < > 91 100 126 94  BUN   < > 10 31* 30* 26*  CREATININE   < > 0.89 0.83 0.83 0.91  CALCIUM   < > 8.6* 10.2 10.4 10.1  MG  --  2.1  --   --   --   PHOS  --  1.4*  --   --   --   TSH  --   --  1.52  --   --    < > = values in this interval not displayed.   Liver  Function Tests: Recent Labs    06/01/19 1221 07/17/19 1621 12/23/19 0859  AST 30 20 26   ALT 26 17 24   ALKPHOS 54  --  69  BILITOT 0.5 0.2 0.3  PROT 6.9 6.6 7.6  ALBUMIN 3.7  --  3.7   No results for input(s): LIPASE, AMYLASE in the last 8760 hours. No results for input(s): AMMONIA in the last 8760 hours. CBC: Recent Labs    06/01/19 1221 07/17/19 1621 12/23/19 0859  WBC 7.0 6.6 8.4  NEUTROABS 4.4 3,663 5.4  HGB 13.7 12.5 12.7  HCT 43.6 37.9 39.1  MCV 90.1 87.7 84.8  PLT 124* 141 131*   Lipid Panel: No results for input(s): CHOL, HDL, LDLCALC, TRIG, CHOLHDL, LDLDIRECT in the last 8760 hours. TSH: Recent Labs    07/17/19 1621  TSH 1.52   A1C: No results found for: HGBA1C   Assessment/Plan .1. Pain and swelling of right lower leg - CBC with Differential/Platelet to rule out  elevated wbc - VAS [place] LOWER EXTREMITY VENOUS (DVT); Future rule out DVT -she has several varicose veins to LE and tenderness could be related to this but will rule out infection and DVT at this time.  Return precautions discussed.  Carlos American. [place], [facility] [phone]

## 2020-01-13 ENCOUNTER — Ambulatory Visit (HOSPITAL_COMMUNITY)
Admission: RE | Admit: 2020-01-13 | Discharge: 2020-01-13 | Disposition: A | Discharge status: Home / Self Care | Payer: Medicare Other | Source: Ambulatory Visit | Attending: Nurse Practitioner | Admitting: Nurse Practitioner

## 2020-01-13 ENCOUNTER — Telehealth: Payer: Self-pay | Admitting: *Deleted

## 2020-01-13 DIAGNOSIS — M79661 Pain in right lower leg: Secondary | ICD-10-CM | POA: Insufficient documentation

## 2020-01-13 DIAGNOSIS — M7989 Other specified soft tissue disorders: Secondary | ICD-10-CM | POA: Insufficient documentation

## 2020-01-13 NOTE — Telephone Encounter (Signed)
Helene with [facility] called and stated that patient's Preliminary Test was NEGATIVE for DVT and Superficial Thrombus.   They sent patient home and will be sending Final Report through Epic.

## 2020-01-13 NOTE — Telephone Encounter (Signed)
Great - thanks

## 2020-01-14 ENCOUNTER — Other Ambulatory Visit: Payer: Self-pay | Admitting: Nurse Practitioner

## 2020-01-14 ENCOUNTER — Telehealth: Payer: Self-pay | Admitting: Physical Therapy

## 2020-01-14 NOTE — Telephone Encounter (Signed)
Returned phone call per pt request.  Left as message for her to call back as needed as there was no answer Donato Heinz. Owens Shark, PT

## 2020-01-27 ENCOUNTER — Telehealth: Payer: Self-pay

## 2020-01-27 NOTE — Telephone Encounter (Signed)
Incoming call received from patient stating for the last week she is having problems urinating. Patient states she has the urge to go and she sits on the toilet for 5 min and nothing. Patient states she is having discomfort from the fullness of her bladder and not being able to empty her bladder.  Patient is drinking 3 full glasses of water or more. Urine is clear. Last time patient was able to void was around lunch time today.  Pertinent negatives: No abdominal pain or blood present. No burning or stinging when urinating.   Patient was requesting a telephone visit with [name], NP today. Patient aware that schedule is full. I had a verbal conversation with Janett Billow and it was concluded that patient should be seen in person tomorrow and go to the ER if symptoms progress.   Appointment scheduled with Dr.Reed for tomorrow at 7:30 am. Patient will confirm with her daughter that she is able to bring her to this appointment, if not she will call back and reschedule. Patient was unable to come at 11:30 am for she has another appointment at [facility] at 10:15 am.

## 2020-01-27 NOTE — Telephone Encounter (Signed)
Noted thank you

## 2020-01-28 ENCOUNTER — Ambulatory Visit
Admission: RE | Admit: 2020-01-28 | Discharge: 2020-01-28 | Disposition: A | Discharge status: Home / Self Care | Payer: Medicare Other | Source: Ambulatory Visit | Attending: Internal Medicine | Admitting: Internal Medicine

## 2020-01-28 ENCOUNTER — Other Ambulatory Visit: Payer: Self-pay

## 2020-01-28 ENCOUNTER — Ambulatory Visit (INDEPENDENT_AMBULATORY_CARE_PROVIDER_SITE_OTHER): Payer: Medicare Other | Admitting: Internal Medicine

## 2020-01-28 ENCOUNTER — Encounter: Payer: Self-pay | Admitting: Internal Medicine

## 2020-01-28 ENCOUNTER — Encounter: Payer: Self-pay | Admitting: Neurology

## 2020-01-28 ENCOUNTER — Ambulatory Visit (INDEPENDENT_AMBULATORY_CARE_PROVIDER_SITE_OTHER): Payer: Medicare Other | Admitting: Neurology

## 2020-01-28 VITALS — BP 130/86 | HR 73 | Temp 97.7°F | Resp 20 | Ht 60.0 in | Wt 121.6 lb

## 2020-01-28 VITALS — BP 137/86 | HR 75 | Ht 60.0 in | Wt 122.2 lb

## 2020-01-28 DIAGNOSIS — G8929 Other chronic pain: Secondary | ICD-10-CM

## 2020-01-28 DIAGNOSIS — R29898 Other symptoms and signs involving the musculoskeletal system: Secondary | ICD-10-CM

## 2020-01-28 DIAGNOSIS — C50512 Malignant neoplasm of lower-outer quadrant of left female breast: Secondary | ICD-10-CM

## 2020-01-28 DIAGNOSIS — R413 Other amnesia: Secondary | ICD-10-CM | POA: Diagnosis not present

## 2020-01-28 DIAGNOSIS — Z23 Encounter for immunization: Secondary | ICD-10-CM

## 2020-01-28 DIAGNOSIS — M545 Low back pain, unspecified: Secondary | ICD-10-CM | POA: Diagnosis not present

## 2020-01-28 DIAGNOSIS — R339 Retention of urine, unspecified: Secondary | ICD-10-CM

## 2020-01-28 DIAGNOSIS — K5904 Chronic idiopathic constipation: Secondary | ICD-10-CM

## 2020-01-28 MED ORDER — SENNOSIDES-DOCUSATE SODIUM 8.6-50 MG PO TABS: 2.0000 | ORAL_TABLET | Freq: Every day | ORAL | 5 refills | Status: AC

## 2020-01-28 NOTE — Patient Instructions (Addendum)
Continue seeing your primary care doctor See you back in 1 year or sooner if needed

## 2020-01-28 NOTE — Patient Instructions (Signed)
Let's check first for an infection to cause the urinary retention. I'm also going to check your blood counts, kidneys and electrolytes I'd like her to get xrays of her lower back to make sure there has not been any cancer spread to the back causing the weakness and retention Please try senokot s two tablets daily (sent to [place] [facility]) to help with hard pieces of stool and help empty the bowel better in case it's contributing to urinary retention

## 2020-01-28 NOTE — Progress Notes (Signed)
Location:  [facility] clinic Provider: Donye Dauenhauer L. Mariea Clonts, D.O., C.M.D.  Code Status: DNR Goals of Care:  Advanced Directives 11/06/2019  Does Patient Have a Medical Advance Directive? Yes  Type of Advance Directive Living will;Out of facility DNR (pink MOST or yellow form)  Does patient want to make changes to medical advance directive? No - Patient declined  Copy of [place] in Chart? -  Pre-existing out of facility DNR order (yellow form or pink MOST form) Yellow form placed in chart (order not valid for inpatient use)     Chief Complaint  Patient presents with  . Acute Visit    c/o Patient says started last week has pressure but can't relive herself  . Immunizations    Patine wants flu shot today    HPI: Patient is a 86 y.o. female seen today for an acute visit for difficulty emptying her bladder.  She has a h/o breast cancer, low back pain, MCI, and others.  She has had a lot of pressure over the past week where she felt like she had to go, but she could not after sitting for 15 mins.  She has to wait until she feels like she has to wet herself and then when she goes, it might only take a few minutes.  Her daughter notes that the problem was getting more and more over the past few months where she'd go to the bathroom and then not go.  She called when she could not go for a few hours.  No pain, just pressure.  No change in appearance of her urine.  It's been mostly clear.  She drinks a 16 oz bottle--1-2 per day.  She does drink boost and low fat and skim milk and sometimes carrot juice.  Has chronic arthritic pain in her lower back, as well, but that's not changed.  No fever or chills.    She had weakness and wobbliness when she tried to use the restroom--had to hold on and get herself back to the bedroom.  She had to go back to bed--she was wobbly for 3 hours.  Yesterday, she was getting PT at home and she was having pain in her right hip and down her thigh that was an  8/10.  It did not last her.  After her back surgery, she had organ failure by her report and had to go back to the ICU--2013.   Past Medical History:  Diagnosis Date  . Arthritis   . Cancer [facility])    left breast cancer   . Cataracts, bilateral    removed by surgery  . CHF (congestive heart failure) ([place])    PACEMAKER & DEFIB  . Complication of anesthesia    hypotensive after back surgery in 2006  . Depression   . Dyslipidemia   . Fainted 04/21/06   AT [place]  . GERD (gastroesophageal reflux disease)   . Headache(784.0)   . Hearing loss    bilateral hearing aids  . HLD (hyperlipidemia)    diet controlled   . Hypertension   . Hypothyroidism   . ICD (implantable cardiac defibrillator) in place    pt has pacer/icd  . ICD (implantable cardiac defibrillator), biventricular, in situ   . LBBB (left bundle branch block)   . Memory loss   . Nonischemic cardiomyopathy ([place])   . Normal coronary arteries    s/p cardiac cath 2007  . Pacemaker    ICD [employer]  . Syncope   .  Systolic CHF ([place])   . Vertigo   . Wears glasses     Past Surgical History:  Procedure Laterality Date  . BACK SURGERY     lumbar fusion   . BREAST LUMPECTOMY Left 2008  . BREAST SURGERY  2000   LUMP REMOVAL. STAGE 1 CANCER  . CARDIAC CATHETERIZATION    . CATARACT EXTRACTION    . COLONOSCOPY    . EYE SURGERY    . IMPLANTABLE CARDIOVERTER DEFIBRILLATOR GENERATOR CHANGE N/A 12/18/2012   Procedure: IMPLANTABLE CARDIOVERTER DEFIBRILLATOR GENERATOR CHANGE;  Surgeon: [name], MD;  Location[facility] CATH LAB;  Service: Cardiovascular;  Laterality: N/A;  . JOINT REPLACEMENT  06/14/01   right  . LUMBAR FUSION  2006  . MASS EXCISION  11/08/2011   Procedure: EXCISION MASS;  Surgeon: [name], MD;  Location: [facility] ORS;  Service: General;  Laterality: Left;  Excision Left Thigh Mass  . MASTECTOMY W/ SENTINEL NODE BIOPSY Left 06/04/2019   Procedure: LEFT MASTECTOMY WITH SENTINEL LYMPH NODE BIOPSY;  Surgeon:  [name], MD;  Location: [place];  Service: General;  Laterality: Left;  Marland Kitchen MASTECTOMY, PARTIAL  2008   GOT PACEMAKER AND DEFIB AT THAT TIME  . PACEMAKER INSERTION  04/23/06  . TOTAL KNEE ARTHROPLASTY  05/17/01   RIGHT KNEE  . TOTAL KNEE ARTHROPLASTY Left 11/29/2014   Procedure: TOTAL LEFT KNEE ARTHROPLASTY;  Surgeon: Paralee Cancel, MD;  Location: [facility] ORS;  Service: Orthopedics;  Laterality: Left;    Allergies  Allergen Reactions  . Iodine Shortness Of Breath    Iodine contrast, CHF , SOB  . Shellfish Allergy Shortness Of Breath  . Memantine     Malaise, fogginess/ couldn't think  . Aspirin Nausea And Vomiting  . Codeine Nausea And Vomiting    Outpatient Encounter Medications as of 01/28/2020  Medication Sig  . acetaminophen (TYLENOL) 650 MG CR tablet Take 650 mg by mouth every 8 (eight) hours as needed for pain.  Marland Kitchen amLODipine (NORVASC) 5 MG tablet Take 1 tablet (5 mg total) by mouth daily.  Marland Kitchen ascorbic acid (VITAMIN C) 500 MG tablet Take 500 mg by mouth daily.  Marland Kitchen buPROPion (WELLBUTRIN XL) 150 MG 24 hr tablet Take 3 tablets (450 mg total) by mouth daily.  . carvedilol (COREG) 3.125 MG tablet TAKE 1 TABLET BY MOUTH  TWICE DAILY  . Cholecalciferol (VITAMIN D3) 50 MCG (2000 UT) TABS Take 2,000 Units by mouth daily.   . furosemide (LASIX) 20 MG tablet TAKE 1 TABLET BY MOUTH  DAILY  . levothyroxine (SYNTHROID, LEVOTHROID) 100 MCG tablet Take 1 tablet (100 mcg total) by mouth daily before breakfast.  . loratadine (CLARITIN) 10 MG tablet Take 10 mg by mouth daily.  . Magnesium 250 MG TABS Take 250 mg by mouth daily.  . meclizine (ANTIVERT) 12.5 MG tablet Take 1 tablet (12.5 mg total) by mouth 3 (three) times daily as needed for dizziness.  . methocarbamol (ROBAXIN) 500 MG tablet Take 1 tablet (500 mg total) by mouth every 8 (eight) hours as needed for muscle spasms.  . NONFORMULARY OR COMPOUNDED ITEM Apply 120 Tubes topically daily. Diclofenac/Cyclobenzaprine/lamotrigine/lidocaine/prilocaine  (10480) 2%/2%/6%/5%/1.25% cream QTY: 120 GM SIG: (NEURO) APPLY 1-2 PUMPS (1-2 GMS) TO AFFECTED AREA (S) OR FOCAL POINTS 3 TO 4 TIMES DAILY. RUB IN FOR 2 MINUTES TO ACHIEVE MAX PENETRATION. Roby HANDS WELL.  . Omega-3 Fatty Acids (FISH OIL PO) Take 1 capsule by mouth daily.  Marland Kitchen omeprazole (PRILOSEC) 40 MG capsule TAKE 1 CAPSULE BY MOUTH  ONCE DAILY FOR STOMACH  . Oxcarbazepine (TRILEPTAL) 300 MG tablet Take 300 mg by mouth daily.   . potassium chloride (KLOR-CON) 10 MEQ tablet TAKE 1 TABLET BY MOUTH  DAILY  . sertraline (ZOLOFT) 100 MG tablet Take 200 mg by mouth daily.   Marland Kitchen spironolactone (ALDACTONE) 25 MG tablet TAKE 1 TABLET BY MOUTH IN  THE MORNING  . Vitamin E 400 units TABS Take 400 Units by mouth daily.    No facility-administered encounter medications on file as of 01/28/2020.    Review of Systems:  Review of Systems  Constitutional: Negative for chills, fever and malaise/fatigue.  HENT: Negative for congestion and sore throat.   Respiratory: Negative for cough and shortness of breath.   Cardiovascular: Negative for chest pain, palpitations and leg swelling.  Gastrointestinal: Positive for constipation. Negative for abdominal pain, blood in stool, diarrhea and melena.       Hard stools  Genitourinary: Negative for dysuria, frequency, hematuria and urgency.       Difficulty emptying bladder  Musculoskeletal: Positive for back pain. Negative for falls.  Neurological: Positive for weakness. Negative for dizziness and loss of consciousness.       Steadies herself when she stands  Psychiatric/Behavioral: Positive for memory loss. Negative for depression. The patient is not nervous/anxious and does not have insomnia.     Health Maintenance  Topic Date Due  . MAMMOGRAM  03/15/2020  . TETANUS/TDAP  01/08/2023  . INFLUENZA VACCINE  Completed  . DEXA SCAN  Completed  . COVID-19 Vaccine  Completed  . PNA vac Low Risk Adult  Completed    Physical Exam: Vitals:   01/28/20 0741  BP:  130/86  Pulse: 73  Resp: 20  Temp: 97.7 F (36.5 C)  TempSrc: Temporal  SpO2: 98%  Weight: 121 lb 9.6 oz (55.2 kg)  Height: 5' (1.524 m)   Body mass index is 23.75 kg/m. Physical Exam Vitals reviewed.  Constitutional:      Appearance: Normal appearance.  HENT:     Head: Normocephalic and atraumatic.  Cardiovascular:     Rate and Rhythm: Normal rate and regular rhythm.     Pulses: Normal pulses.     Heart sounds: Normal heart sounds.  Pulmonary:     Effort: Pulmonary effort is normal.     Breath sounds: Normal breath sounds. No wheezing, rhonchi or rales.  Abdominal:     General: Bowel sounds are normal.     Tenderness: There is no abdominal tenderness. There is no guarding or rebound.     Comments: Suprapubic mass (bladder), but did pass urine to provide sample this am  Musculoskeletal:        General: Normal range of motion.     Comments: Mild tenderness of mid to lower lumbar region  Neurological:     General: No focal deficit present.     Mental Status: She is alert.     Motor: Weakness present.     Gait: Gait abnormal.     Comments: No focal weakness just generalized; some short-term memory loss  Psychiatric:        Mood and Affect: Mood normal.     Labs reviewed: Basic Metabolic Panel: Recent Labs    04/15/19 1119 06/05/19 0342 07/17/19 1621 11/06/19 1523 12/23/19 0859  NA   < > 140 140 140 139  K   < > 3.8 4.5 3.9 4.3  CL   < > 105 106 106 104  CO2   < > 27  27 28 29   GLUCOSE   < > 91 100 126 94  BUN   < > 10 31* 30* 26*  CREATININE   < > 0.89 0.83 0.83 0.91  CALCIUM   < > 8.6* 10.2 10.4 10.1  MG  --  2.1  --   --   --   PHOS  --  1.4*  --   --   --   TSH  --   --  1.52  --   --    < > = values in this interval not displayed.   Liver Function Tests: Recent Labs    06/01/19 1221 07/17/19 1621 12/23/19 0859  AST 30 20 26   ALT 26 17 24   ALKPHOS 54  --  69  BILITOT 0.5 0.2 0.3  PROT 6.9 6.6 7.6  ALBUMIN 3.7  --  3.7   No results for  input(s): LIPASE, AMYLASE in the last 8760 hours. No results for input(s): AMMONIA in the last 8760 hours. CBC: Recent Labs    07/17/19 1621 12/23/19 0859 01/12/20 1604  WBC 6.6 8.4 9.1  NEUTROABS 3,663 5.4 5,915  HGB 12.5 12.7 12.7  HCT 37.9 39.1 38.0  MCV 87.7 84.8 85.4  PLT 141 131* 129*   Lipid Panel: No results for input(s): CHOL, HDL, LDLCALC, TRIG, CHOLHDL, LDLDIRECT in the last 8760 hours. No results found for: HGBA1C  Procedures since last visit: VAS [place] LOWER EXTREMITY VENOUS (DVT)  Result Date: 01/13/2020  Lower Venous DVTStudy Indications: Lumps in the posterior calf.  Performing Technologist: Ralene Cork RVT  Examination Guidelines: A complete evaluation includes B-mode imaging, spectral Doppler, color Doppler, and power Doppler as needed of all accessible portions of each vessel. Bilateral testing is considered an integral part of a complete examination. Limited examinations for reoccurring indications may be performed as noted. The reflux portion of the exam is performed with the patient in reverse Trendelenburg.  +---------+---------------+---------+-----------+----------+--------------+ RIGHT    CompressibilityPhasicitySpontaneityPropertiesThrombus Aging +---------+---------------+---------+-----------+----------+--------------+ CFV      Full           Yes      Yes                                 +---------+---------------+---------+-----------+----------+--------------+ SFJ      Full                    Yes                                 +---------+---------------+---------+-----------+----------+--------------+ FV Prox  Full           Yes      Yes                                 +---------+---------------+---------+-----------+----------+--------------+ FV Mid   Full           Yes      Yes                                 +---------+---------------+---------+-----------+----------+--------------+ FV DistalFull           Yes      Yes                                  +---------+---------------+---------+-----------+----------+--------------+  POP      Full           Yes      Yes                                 +---------+---------------+---------+-----------+----------+--------------+ PTV      Full                    Yes                                 +---------+---------------+---------+-----------+----------+--------------+ PERO     Full                    Yes                                 +---------+---------------+---------+-----------+----------+--------------+ GSV      Full           Yes      Yes                                 +---------+---------------+---------+-----------+----------+--------------+  Findings reported to Rodena Piety at 11:25 am.  Summary: RIGHT: - There is no evidence of deep vein thrombosis in the lower extremity. - There is no evidence of superficial venous thrombosis.   *See table(s) above for measurements and observations. Electronically signed by Deitra Mayo MD on 01/13/2020 at 1:01:12 PM.    Final     Assessment/Plan 1. Urinary retention - not clear if due to her chronic back problems vs mets vs constipation vs uti  - Urinalysis, Routine w reflex microscopic - Urine Culture - CBC with Differential/Platelet - COMPLETE METABOLIC PANEL WITH GFR  2. Chronic right-sided low back pain without sciatica - as below - DG Lumbar Spine 2-3 Views; Future - CBC with Differential/Platelet - COMPLETE METABOLIC PANEL WITH GFR  3. Weakness of both lower extremities - not on exam, but did have at home past few days per pt and daughter - DG Lumbar Spine 2-3 Views; Future - CBC with Differential/Platelet - COMPLETE METABOLIC PANEL WITH GFR  4. Need for influenza vaccination - Flu Vaccine QUAD High Dose(Fluad) given  5. Malignant neoplasm of lower-outer quadrant of left female breast, unspecified estrogen receptor status ([place]) -recently in feb of this year -opted not to have  further treatments after mastectomy -will r/o metastatic disease in lower back causing her recent increased weakness and urinary retention  6. Chronic idiopathic constipation - having hard stools so add stool softener to ensure constipation is not causing the retention - senna-docusate (SENOKOT S) 8.6-50 MG tablet; Take 2 tablets by mouth daily.  Dispense: 60 tablet; Refill: 5  Labs/tests ordered:   Orders Placed This Encounter  Procedures  . Urine Culture  . DG Lumbar Spine 2-3 Views    Standing Status:   Future    Standing Expiration Date:   01/27/2021    Order Specific Question:   Reason for Exam (SYMPTOM  OR DIAGNOSIS REQUIRED)    Answer:   low back pain, leg weakness, breast cancer    Order Specific Question:   Preferred imaging location?    Answer:   [facility]  . Flu Vaccine QUAD High Dose(Fluad)  . Urinalysis, Routine  w reflex microscopic    Order Specific Question:   Release to patient    Answer:   Immediate  . CBC with Differential/Platelet  . COMPLETE METABOLIC PANEL WITH GFR   Next appt:  F/u with Janett Billow in one week 40 mins spent reviewing records and history   Panayiota Larkin L. Kamry Faraci, D.O. [facility] [address], [place] [address] Cell Phone (Mon-Fri 8am-5pm):  [phone] On Call:  [phone] & follow prompts after 5pm & weekends Office Phone:  [phone] Office Fax:  [phone]

## 2020-01-28 NOTE — Progress Notes (Signed)
PATIENT: [name] DOB: 1933-07-01  REASON FOR VISIT: follow up HISTORY FROM: patient  HISTORY OF PRESENT ILLNESS: Today 01/28/20  HISTORY HISTORY OF PRESENT ILLNESS:HISTORY [name] is a 86 yo RH AAF referred by her primary care Dr. Baird Cancer for evaluation of memory trouble, drive herself to office alone at today's clinical visit.  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 Nicholes Calamity under general anesthesia, woke up from surgery, she noticed mild memory trouble, she has short-term memory trouble, has been persistent since then, she denies difficulty talking, no strokelike symptoms then. She lives at home with her family, highly functional, driving, independent at daily activity, able to keep her check in balance, She suffered long-standing history of bipolar disorder, on polypharmacy treatment, this including Trileptal 300 mg daily, Zoloft 100 mg a day, Wellbutrin 150 mg 3 tablets a day, She had accident of sudden onset dizziness couple days ago, after dinner, she felt lightheaded, has to crawling upstairs, was helped by her husband to get up, then she fell to the ground, whole-body shaking, no loss of consciousness. She has baseline mild gait difficulty due to her low back pain, bilateral knee pain, She denied a family history of dementia, CT head in 2013, Unchanged mild atrophy and microvascular ischemic disease without acute intracranial process. She had a history of chronic systolic heart failure, left bundle branch block, status post biventricular ICD insertion in 2009, underwent device generator change out Sept 2014. S She presents for one year evaluation. She reports feeling dizzy Tuesday night with improvement by Wednesday morning. She also reports upper body "shaking". The only pain she has is from  arthritis. She gets some mild edema in her right ankle. She reports her blood pressure at home usually runs 110-120/60-70. Sometimes when she gets up quickly, she felt lightheaded.  UPDATE April 8th 2016: She is overall doing very well, only has occasionally dizziness, especially when she gets up quickly, today's Mini-Mental status examination is 29 out of 30 CAT scan of the brain showed mild small vessel disease, no acute lesions, EEG showed mild slowing  UPDATE July 16 2016:YY She drive here herself, she continue complains of worsening memory loss, she lives with her husband in their house of more than 31 years old, she is independent in her daily activity, exercise regularly, Following senioremexercise program on TV, she has mild low back pain, cause mild gait difficulty sometimes, radiating pain to bilateral lower extremity, she has no bowel and bladder incontinence.  Update 07/31/2018 YY:She complains of worseninggait abnormalities and memory loss,she lives with her husband, she still drives, stumble a lot,  Previously, she has tried aricept and namenda, could not tolerate the side effect.I have reviewed and agreed above plan.  Update January 21, 2019 SS:She is here today alone. Her husband suffered a stroke a few days ago, is in the hospital. She is living alone for the time being, he will have to go to rehab. She continues to drive a car. She denies any trouble getting lost or with directions. She is able to perform her own ADLs and manage her medications. She indicates she does have some gait instability due to back pain. She has not had any falls. Since last seen, she indicates some memory decline, mostly trouble concentrating. She has 1 son who lives in [place]. She reports  she has neighbors and friends who are nearby tooffer support. She says her overall health has been good. She does have underlying bipolar disorder, is managed by her psychiatrist, Dr Reece Levy.She  reports she does a daily senior TV exercise program.She has been unable to tolerate Aricept, Exelon, or Namenda due to side effect.   Update July 23, 2019 SS: Here with her daughter. A lot of changes since last seen, her husband passed away, she was diagnosed with breast cancer, had a left mastectomy in February.  Her daughter from [place], will be relocating here. She has noticed decline in memory, more forgetful, misplacing things. No longer driving, mostly due to the lymphedema in her left arm. Has complained of dizziness since her mastectomy, feels her walking is staggery, 2 falls last few months. Has improved over time, taking meclizine as needed (needs refill), feels the room is spinning worse when she moves her head, notices various times, but often lying down in the bed. Is in PT currently, possibly vestibular rehab. CT head scan is scheduled 4/28, has seen PCP.    Update January 28, 2020 SS: Here today with her daughter, her daughter has relocated, they were living together.  Memory issues most notable for trouble with word finding, worse if tired, not feeling well.  MMSE 24/30 today.  Has been unable to tolerate Aricept, Namenda, Exelon.  History of breast cancer, seems in remission. She saw PCP today for urinary retention, labs/urine pending, also lumbar x-ray today. Was walking few days ago, had sharp low back pain, down left leg, then went away/felt weak. Last week standing at sink, had headache, legs were wobbly/weak, couldn't urinate, didn't feel well, laid down for awhile, then was fine. Doing PT at home. Only dribbling a few drops or urine.  REVIEW OF SYSTEMS: Out of a complete 14 system review of symptoms, the patient complains only of the following symptoms, and all other reviewed systems are negative.  Memory loss  ALLERGIES: Allergies  Allergen Reactions  . Iodine Shortness Of Breath    Iodine contrast, CHF , SOB  . Shellfish Allergy Shortness Of Breath  . Memantine      Malaise, fogginess/ couldn't think  . Aspirin Nausea And Vomiting  . Codeine Nausea And Vomiting    HOME MEDICATIONS: Outpatient Medications Prior to Visit  Medication Sig Dispense Refill  . acetaminophen (TYLENOL) 650 MG CR tablet Take 650 mg by mouth every 8 (eight) hours as needed for pain.    Marland Kitchen amLODipine (NORVASC) 5 MG tablet Take 1 tablet (5 mg total) by mouth daily. 90 tablet 1  . ascorbic acid (VITAMIN C) 500 MG tablet Take 500 mg by mouth daily.    Marland Kitchen buPROPion (WELLBUTRIN XL) 150 MG 24 hr tablet Take 3 tablets (450 mg total) by mouth daily. 270 tablet 3  . carvedilol (COREG) 3.125 MG tablet TAKE 1 TABLET BY MOUTH  TWICE DAILY 180 tablet 3  . Cholecalciferol (VITAMIN D3) 50 MCG (2000 UT) TABS Take 2,000 Units by mouth daily.     . furosemide (LASIX) 20 MG tablet TAKE 1 TABLET BY MOUTH  DAILY 90 tablet 1  . levothyroxine (SYNTHROID, LEVOTHROID) 100 MCG tablet Take 1 tablet (100 mcg total) by mouth daily before breakfast. 90 tablet 3  . loratadine-pseudoephedrine (CLARITIN-D 12-HOUR) 5-120 MG tablet Take 1 tablet by mouth daily.    . Magnesium 250 MG TABS Take 250 mg by mouth daily.    . meclizine (ANTIVERT) 12.5 MG tablet Take 1 tablet (12.5 mg  total) by mouth 3 (three) times daily as needed for dizziness. 30 tablet 1  . methocarbamol (ROBAXIN) 500 MG tablet Take 1 tablet (500 mg total) by mouth every 8 (eight) hours as needed for muscle spasms. 30 tablet 0  . NONFORMULARY OR COMPOUNDED ITEM Apply 120 Tubes topically daily. Diclofenac/Cyclobenzaprine/lamotrigine/lidocaine/prilocaine (10480) 2%/2%/6%/5%/1.25% cream QTY: 120 GM SIG: (NEURO) APPLY 1-2 PUMPS (1-2 GMS) TO AFFECTED AREA (S) OR FOCAL POINTS 3 TO 4 TIMES DAILY. RUB IN FOR 2 MINUTES TO ACHIEVE MAX PENETRATION. [place] HANDS WELL. 1 each 2  . Omega-3 Fatty Acids (FISH OIL PO) Take 1 capsule by mouth daily.    Marland Kitchen omeprazole (PRILOSEC) 40 MG capsule TAKE 1 CAPSULE BY MOUTH  ONCE DAILY FOR STOMACH 90 capsule 3  . Oxcarbazepine  (TRILEPTAL) 300 MG tablet Take 300 mg by mouth daily.     . potassium chloride (KLOR-CON) 10 MEQ tablet TAKE 1 TABLET BY MOUTH  DAILY 90 tablet 1  . senna-docusate (SENOKOT S) 8.6-50 MG tablet Take 2 tablets by mouth daily. 60 tablet 5  . sertraline (ZOLOFT) 100 MG tablet Take 200 mg by mouth daily.     Marland Kitchen spironolactone (ALDACTONE) 25 MG tablet TAKE 1 TABLET BY MOUTH IN  THE MORNING 90 tablet 1  . Vitamin E 400 units TABS Take 400 Units by mouth daily.     Marland Kitchen loratadine (CLARITIN) 10 MG tablet Take 10 mg by mouth daily.     No facility-administered medications prior to visit.    PAST MEDICAL HISTORY: Past Medical History:  Diagnosis Date  . Arthritis   . Cancer [facility])    left breast cancer   . Cataracts, bilateral    removed by surgery  . CHF (congestive heart failure) ([place])    PACEMAKER & DEFIB  . Complication of anesthesia    hypotensive after back surgery in 2006  . Depression   . Dyslipidemia   . Fainted 04/21/06   AT [place]  . GERD (gastroesophageal reflux disease)   . Headache(784.0)   . Hearing loss    bilateral hearing aids  . HLD (hyperlipidemia)    diet controlled   . Hypertension   . Hypothyroidism   . ICD (implantable cardiac defibrillator) in place    pt has pacer/icd  . ICD (implantable cardiac defibrillator), biventricular, in situ   . LBBB (left bundle branch block)   . Memory loss   . Nonischemic cardiomyopathy ([place])   . Normal coronary arteries    s/p cardiac cath 2007  . Pacemaker    ICD [employer]  . Syncope   . Systolic CHF ([place])   . Vertigo   . Wears glasses     PAST SURGICAL HISTORY: Past Surgical History:  Procedure Laterality Date  . BACK SURGERY     lumbar fusion   . BREAST LUMPECTOMY Left 2008  . BREAST SURGERY  2000   LUMP REMOVAL. STAGE 1 CANCER  . CARDIAC CATHETERIZATION    . CATARACT EXTRACTION    . COLONOSCOPY    . EYE SURGERY    . IMPLANTABLE CARDIOVERTER DEFIBRILLATOR GENERATOR CHANGE N/A 12/18/2012   Procedure:  IMPLANTABLE CARDIOVERTER DEFIBRILLATOR GENERATOR CHANGE;  Surgeon: [name], MD;  Location: [facility] CATH LAB;  Service: Cardiovascular;  Laterality: N/A;  . JOINT REPLACEMENT  06/14/01   right  . LUMBAR FUSION  2006  . MASS EXCISION  11/08/2011   Procedure: EXCISION MASS;  Surgeon: [name], MD;  [facility];  Service: General;  Laterality: Left;  Excision Left Thigh Mass  . MASTECTOMY W/ SENTINEL NODE BIOPSY Left 06/04/2019   Procedure: LEFT MASTECTOMY WITH SENTINEL LYMPH NODE BIOPSY;  Surgeon: [name], MD;  Location: [place];  Service: General;  Laterality: Left;  Marland Kitchen MASTECTOMY, PARTIAL  2008   GOT PACEMAKER AND DEFIB AT THAT TIME  . PACEMAKER INSERTION  04/23/06  . TOTAL KNEE ARTHROPLASTY  05/17/01   RIGHT KNEE  . TOTAL KNEE ARTHROPLASTY Left 11/29/2014   Procedure: TOTAL LEFT KNEE ARTHROPLASTY;  Surgeon: Paralee Cancel, MD;  Location[facility];  Service: Orthopedics;  Laterality: Left;    FAMILY HISTORY: Family History  Problem Relation Age of Onset  . Hypertension Mother   . Arthritis Mother   . Hypertension Father   . Hypertension Brother   . Hypertension Brother     SOCIAL HISTORY: Social History   Socioeconomic History  . Marital status: Married    Spouse name: Not on file  . Number of children: 1  . Years of education: Masters  . Highest education level: Not on file  Occupational History  . Occupation: Retired  Tobacco Use  . Smoking status: Never Smoker  . Smokeless tobacco: Never Used  Vaping Use  . Vaping Use: Never used  Substance and Sexual Activity  . Alcohol use: No  . Drug use: No  . Sexual activity: Not Currently    Birth control/protection: Post-menopausal  Other Topics Concern  . Not on file  Social History Narrative   Lives at home with husband.   Right-handed.      As of 07/28/2014   Diet: No special diet   Caffeine: yes, Chocolate, tea and sodas    Married: YES, 1970   House: Yes, 2 stories, 2-3 persons live in home   Pets: No    Current/Past profession: Engineer, mining, Designer, jewellery    Exercise: Yes 2-3 x weekly   Living Will: Yes   DNR: No   POA/HPOA: No      Social Determinants of Radio broadcast assistant Strain:   . Difficulty of Paying Living Expenses: Not on file  Food Insecurity:   . Worried About Charity fundraiser in the Last Year: Not on file  . Ran Out of Food in the Last Year: Not on file  Transportation Needs:   . Lack of Transportation (Medical): Not on file  . Lack of Transportation (Non-Medical): Not on file  Physical Activity:   . Days of Exercise per Week: Not on file  . Minutes of Exercise per Session: Not on file  Stress:   . Feeling of Stress : Not on file  Social Connections:   . Frequency of Communication with Friends and Family: Not on file  . Frequency of Social Gatherings with Friends and Family: Not on file  . Attends Religious Services: Not on file  . Active Member of Clubs or Organizations: Not on file  . Attends Archivist Meetings: Not on file  . Marital Status: Not on file  Intimate Partner Violence:   . Fear of Current or Ex-Partner: Not on file  . Emotionally Abused: Not on file  . Physically Abused: Not on file  . Sexually Abused: Not on file   PHYSICAL EXAM  Vitals:   01/28/20 1006  BP: 137/86  Pulse: 75  Weight: 122 lb 3.2 oz (55.4 kg)  Height: 5' (1.524 m)   Body mass index is 23.87 kg/m.  Generalized: Well developed, in no acute distress  MMSE - Mini  Mental State Exam 01/28/2020 07/23/2019 01/21/2019  Not completed: - - (No Data)  Orientation to time 5 4 5   Orientation to Place 5 5 4   Registration 3 3 3   Attention/ Calculation 0 4 1  Recall 3 2 3   Language- name 2 objects 2 2 1   Language- repeat 0 1 1  Language- follow 3 step command 3 3 2   Language- follow 3 step command-comments - - she didnt fold the paper  Language- read & follow direction 1 1 1   Write a sentence 1 1 1   Copy design 1 1 1   Copy design-comments - 13 animals  -  Total score 24 27 23     Neurological examination  Mentation: Alert oriented to time, place, history taking. Follows all commands speech and language fluent Cranial nerve II-XII: Pupils were equal round reactive to light. Extraocular movements were full, visual field were full on confrontational test. Facial sensation and strength were normal. Head turning and shoulder shrug  were normal and symmetric. Motor: Good strength all extremities Sensory: Sensory testing is intact to soft touch on all 4 extremities. No evidence of extinction is noted.  Coordination: Cerebellar testing reveals good finger-nose-finger and heel-to-shin bilaterally.  Gait and station: Gait is slightly wide-based, cautious, but steady. Reflexes: Deep tendon reflexes are symmetric but depressed throughout  DIAGNOSTIC DATA (LABS, IMAGING, TESTING) - I reviewed patient records, labs, notes, testing and imaging myself where available.  Lab Results  Component Value Date   WBC 9.1 01/12/2020   HGB 12.7 01/12/2020   HCT 38.0 01/12/2020   MCV 85.4 01/12/2020   PLT 129 (L) 01/12/2020      Component Value Date/Time   NA 139 12/23/2019 0859   NA 139 10/05/2015 0944   K 4.3 12/23/2019 0859   CL 104 12/23/2019 0859   CO2 29 12/23/2019 0859   GLUCOSE 94 12/23/2019 0859   BUN 26 (H) 12/23/2019 0859   BUN 25 10/05/2015 0944   CREATININE 0.91 12/23/2019 0859   CREATININE 0.83 11/06/2019 1523   CALCIUM 10.1 12/23/2019 0859   PROT 7.6 12/23/2019 0859   PROT 6.4 10/05/2015 0944   ALBUMIN 3.7 12/23/2019 0859   ALBUMIN 4.3 10/05/2015 0944   AST 26 12/23/2019 0859   ALT 24 12/23/2019 0859   ALKPHOS 69 12/23/2019 0859   BILITOT 0.3 12/23/2019 0859   GFRNONAA 58 (L) 12/23/2019 0859   GFRNONAA 65 11/06/2019 1523   GFRAA >60 12/23/2019 0859   GFRAA 76 11/06/2019 1523   Lab Results  Component Value Date   CHOL 182 11/10/2018   HDL 60 11/10/2018   LDLCALC 104 (H) 11/10/2018   TRIG 88 11/10/2018   CHOLHDL 3.0  11/10/2018   No results found for: HGBA1C Lab Results  Component Value Date   VITAMINB12 1,117 (H) 07/28/2014   Lab Results  Component Value Date   TSH 1.52 07/17/2019   ASSESSMENT AND PLAN 86 y.o. year old female  has a past medical history of Arthritis, Cancer ([place]), Cataracts, bilateral, CHF (congestive heart failure) ([place]), Complication of anesthesia, Depression, Dyslipidemia, Fainted (04/21/06), GERD (gastroesophageal reflux disease), Headache(784.0), Hearing loss, HLD (hyperlipidemia), Hypertension, Hypothyroidism, ICD (implantable cardiac defibrillator) in place, ICD (implantable cardiac defibrillator), biventricular, in situ, LBBB (left bundle branch block), Memory loss, Nonischemic cardiomyopathy ([place]), Normal coronary arteries, Pacemaker, Syncope, Systolic CHF ([place]), Vertigo, and Wears glasses. here with:  1.  Mild cognitive impairment -Unable to tolerate Aricept, Exelon, or Namenda due to side effect -MMSE 24/30 today -Daughter is now living with  her, doesn't drive -Discussed importance of healthy lifestyle, brain stimulating exercises -Wants to continue annual follow-up here, has close follow-up with PCP -Pending UA/labs/lumbar xray from PCP for urinary retention  I spent 30 minutes of face-to-face and non-face-to-face time with patient.  This included previsit chart review, lab review, study review, order entry, electronic health record documentation, patient education.  Butler Denmark, AGNP-C, DNP 01/28/2020, 10:20 AM [facility] [address], [place], [place] [address] [phone]

## 2020-01-29 ENCOUNTER — Other Ambulatory Visit: Payer: Self-pay

## 2020-01-29 DIAGNOSIS — R339 Retention of urine, unspecified: Secondary | ICD-10-CM

## 2020-01-29 LAB — URINALYSIS, ROUTINE W REFLEX MICROSCOPIC
Bilirubin Urine: NEGATIVE
Glucose, UA: NEGATIVE
Hgb urine dipstick: NEGATIVE
Ketones, ur: NEGATIVE
Leukocytes,Ua: NEGATIVE
Nitrite: NEGATIVE
Protein, ur: NEGATIVE
Specific Gravity, Urine: 1.017 (ref 1.001–1.03)
pH: 6.5 (ref 5.0–8.0)

## 2020-01-29 LAB — COMPLETE METABOLIC PANEL WITH GFR
AG Ratio: 1.4 (calc) (ref 1.0–2.5)
ALT: 27 U/L (ref 6–29)
AST: 26 U/L (ref 10–35)
Albumin: 4.2 g/dL (ref 3.6–5.1)
Alkaline phosphatase (APISO): 62 U/L (ref 37–153)
BUN/Creatinine Ratio: 33 (calc) — ABNORMAL HIGH (ref 6–22)
BUN: 31 mg/dL — ABNORMAL HIGH (ref 7–25)
CO2: 33 mmol/L — ABNORMAL HIGH (ref 20–32)
Calcium: 10.5 mg/dL — ABNORMAL HIGH (ref 8.6–10.4)
Chloride: 102 mmol/L (ref 98–110)
Creat: 0.95 mg/dL — ABNORMAL HIGH (ref 0.60–0.88)
GFR, Est African American: 64 mL/min/{1.73_m2} (ref >=60)
GFR, Est Non African American: 55 mL/min/{1.73_m2} — ABNORMAL LOW (ref >=60)
Globulin: 3 g/dL (calc) (ref 1.9–3.7)
Glucose, Bld: 91 mg/dL (ref 65–99)
Potassium: 4.5 mmol/L (ref 3.5–5.3)
Sodium: 141 mmol/L (ref 135–146)
Total Bilirubin: 0.4 mg/dL (ref 0.2–1.2)
Total Protein: 7.2 g/dL (ref 6.1–8.1)

## 2020-01-29 LAB — CBC WITH DIFFERENTIAL/PLATELET
Absolute Monocytes: 867 cells/uL (ref 200–950)
Basophils Absolute: 32 cells/uL (ref 0–200)
Basophils Relative: 0.4 %
Eosinophils Absolute: 308 cells/uL (ref 15–500)
Eosinophils Relative: 3.8 %
HCT: 41.2 % (ref 35.0–45.0)
Hemoglobin: 13.6 g/dL (ref 11.7–15.5)
Lymphs Abs: 1798 cells/uL (ref 850–3900)
MCH: 28.5 pg (ref 27.0–33.0)
MCHC: 33 g/dL (ref 32.0–36.0)
MCV: 86.2 fL (ref 80.0–100.0)
MPV: 11.9 fL (ref 7.5–12.5)
Monocytes Relative: 10.7 %
Neutro Abs: 5095 cells/uL (ref 1500–7800)
Neutrophils Relative %: 62.9 %
Platelets: 120 10*3/uL — ABNORMAL LOW (ref 140–400)
RBC: 4.78 10*6/uL (ref 3.80–5.10)
RDW: 13.9 % (ref 11.0–15.0)
Total Lymphocyte: 22.2 %
WBC: 8.1 10*3/uL (ref 3.8–10.8)

## 2020-01-29 LAB — URINE CULTURE
MICRO NUMBER:: 11102430
Result:: NO GROWTH
SPECIMEN QUALITY:: ADEQUATE

## 2020-01-29 NOTE — Progress Notes (Signed)
Urinalysis was normal. Blood counts (outside of usual low platelets) were normal Kidney function just slightly decreased possibly from retaining urine off and on Based on these findings, we need her to have a urology evaluation to determine why she's not emptying well.  Let's put it in urgently, please.

## 2020-02-01 NOTE — Progress Notes (Signed)
Bad arthritis in her lower back and old surgical changes.  Nothing acutely wrong.

## 2020-02-03 ENCOUNTER — Encounter: Payer: Self-pay | Admitting: Urology

## 2020-02-03 ENCOUNTER — Ambulatory Visit (INDEPENDENT_AMBULATORY_CARE_PROVIDER_SITE_OTHER): Payer: Medicare Other | Admitting: Urology

## 2020-02-03 ENCOUNTER — Other Ambulatory Visit: Payer: Self-pay

## 2020-02-03 VITALS — BP 109/67 | HR 79 | Ht <= 58 in | Wt 120.1 lb

## 2020-02-03 DIAGNOSIS — R339 Retention of urine, unspecified: Secondary | ICD-10-CM

## 2020-02-03 LAB — BLADDER SCAN AMB NON-IMAGING

## 2020-02-03 NOTE — Progress Notes (Signed)
02/03/20 5:10 PM   [place] 09-29-1933 [id number]  CC: " Urinary retention"  HPI: I saw [name] in her daughter today in urology clinic for possible urinary retention. She is a very comorbid 86 year old female with a number of medical problems who presents with 1 week of worsening urinary symptoms and feeling of incomplete bladder emptying and difficulty urinating. PVR in clinic today is normal at 72 mL, and urinalysis was benign with PCP on 01/28/2020. She denies any prior history of urinary retention or UTIs. She has had some trouble with constipation over the last few weeks as well. She had a lumbar x-ray with her PCP on 10/22 that showed stable hardware and degenerative disc disease. She most recently underwent head imaging in April 2021 which was essentially benign. She also notes some lower extremity weakness over the last few weeks that has improved, as well as some changes in her vision.  Recent BMP with PCP showed a creatinine of 0.95, with EGFR greater than 60, and CBC was benign.  PMH: Past Medical History:  Diagnosis Date  . Arthritis   . Cancer [facility])    left breast cancer   . Cataracts, bilateral    removed by surgery  . CHF (congestive heart failure) ([place])    PACEMAKER & DEFIB  . Complication of anesthesia    hypotensive after back surgery in 2006  . Depression   . Dyslipidemia   . Fainted 04/21/06   AT [place]  . GERD (gastroesophageal reflux disease)   . Headache(784.0)   . Hearing loss    bilateral hearing aids  . HLD (hyperlipidemia)    diet controlled   . Hypertension   . Hypothyroidism   . ICD (implantable cardiac defibrillator) in place    pt has pacer/icd  . ICD (implantable cardiac defibrillator), biventricular, in situ   . LBBB (left bundle branch block)   . Memory loss   . Nonischemic cardiomyopathy ([place])   . Normal coronary arteries    s/p cardiac cath 2007  . Pacemaker    ICD [employer]  . Syncope   . Systolic CHF ([place])     . Vertigo   . Wears glasses     Surgical History: Past Surgical History:  Procedure Laterality Date  . BACK SURGERY     lumbar fusion   . BREAST LUMPECTOMY Left 2008  . BREAST SURGERY  2000   LUMP REMOVAL. STAGE 1 CANCER  . CARDIAC CATHETERIZATION    . CATARACT EXTRACTION    . COLONOSCOPY    . EYE SURGERY    . IMPLANTABLE CARDIOVERTER DEFIBRILLATOR GENERATOR CHANGE N/A 12/18/2012   Procedure: IMPLANTABLE CARDIOVERTER DEFIBRILLATOR GENERATOR CHANGE;  Surgeon: [name], MD;  Location: [facility] CATH LAB;  Service: Cardiovascular;  Laterality: N/A;  . JOINT REPLACEMENT  06/14/01   right  . LUMBAR FUSION  2006  . MASS EXCISION  11/08/2011   Procedure: EXCISION MASS;  Surgeon: [name], MD;  [facility];  Service: General;  Laterality: Left;  Excision Left Thigh Mass  . MASTECTOMY W/ SENTINEL NODE BIOPSY Left 06/04/2019   Procedure: LEFT MASTECTOMY WITH SENTINEL LYMPH NODE BIOPSY;  Surgeon: [name], MD;  Location: [place];  Service: General;  Laterality: Left;  Marland Kitchen MASTECTOMY, PARTIAL  2008   GOT PACEMAKER AND DEFIB AT THAT TIME  . PACEMAKER INSERTION  04/23/06  . TOTAL KNEE ARTHROPLASTY  05/17/01   RIGHT KNEE  . TOTAL KNEE ARTHROPLASTY Left 11/29/2014   Procedure: TOTAL  LEFT KNEE ARTHROPLASTY;  Surgeon: Paralee Cancel, MD;  [facility];  Service: Orthopedics;  Laterality: Left;    Family History: Family History  Problem Relation Age of Onset  . Hypertension Mother   . Arthritis Mother   . Hypertension Father   . Hypertension Brother   . Hypertension Brother     Social History:  reports that she has never smoked. She has never used smokeless tobacco. She reports that she does not drink alcohol and does not use drugs.  Physical Exam: BP 109/67 (BP Location: Left Arm, Patient Position: Sitting, Cuff Size: Normal)   Pulse 79   Ht $R'4\' 10"'nI$  (1.473 m)   Wt 120 lb 1.6 oz (54.5 kg)   LMP  (LMP Unknown)   BMI 25.10 kg/m    Constitutional:  Alert and oriented, No acute  distress. Frail-appearing Cardiovascular: No clubbing, cyanosis, or edema. Respiratory: Normal respiratory effort, no increased work of breathing. GI: Abdomen is soft, nontender, nondistended, no abdominal masses  Laboratory Data: Reviewed, see HPI  Pertinent Imaging: Reviewed, see HPI  Assessment & Plan:   86 year old female with numerous comorbidities who is very frail at baseline who reports 1 to 2 weeks of worsening urinary symptoms of feeling of incomplete emptying and straining to urinate. Urinalysis is benign, and PVR is normal at 70 mL in clinic today. She also has some concerning symptoms including leg weakness and some changes in vision. We discussed there are numerous possible etiologies for her urinary symptoms including neurologic, malignancy, infection, inflammation, MS, or other neurologic etiology. We also discussed simple things like constipation can cause similar symptoms to what she has been experiencing. She is emptying her bladder well in clinic today, and reassurance was provided. I recommended timed voiding and managing constipation over the next few weeks to see if her symptoms improve. We discussed return precautions at length. In the setting of her leg weakness and vision changes, I recommended she contact her neurologist for consideration of other head/spinal cord imaging. We also reviewed the role of urodynamics in the future if she has refractory symptoms.  RTC 4 to 6 weeks for repeat PVR, symptom check  Nickolas Madrid, MD 02/03/2020  [facility] [address] [place], [place] [address] [phone]

## 2020-02-04 ENCOUNTER — Other Ambulatory Visit: Payer: Self-pay

## 2020-02-04 ENCOUNTER — Telehealth: Payer: Self-pay | Admitting: Internal Medicine

## 2020-02-04 ENCOUNTER — Ambulatory Visit (INDEPENDENT_AMBULATORY_CARE_PROVIDER_SITE_OTHER): Payer: Medicare Other

## 2020-02-04 ENCOUNTER — Encounter: Payer: Self-pay | Admitting: Internal Medicine

## 2020-02-04 ENCOUNTER — Ambulatory Visit (INDEPENDENT_AMBULATORY_CARE_PROVIDER_SITE_OTHER): Payer: Medicare Other | Admitting: Internal Medicine

## 2020-02-04 VITALS — BP 122/76 | HR 74 | Temp 97.7°F | Ht <= 58 in | Wt 121.1 lb

## 2020-02-04 DIAGNOSIS — G8929 Other chronic pain: Secondary | ICD-10-CM

## 2020-02-04 DIAGNOSIS — M545 Low back pain, unspecified: Secondary | ICD-10-CM

## 2020-02-04 DIAGNOSIS — R339 Retention of urine, unspecified: Secondary | ICD-10-CM | POA: Insufficient documentation

## 2020-02-04 DIAGNOSIS — I428 Other cardiomyopathies: Secondary | ICD-10-CM | POA: Diagnosis not present

## 2020-02-04 DIAGNOSIS — K5904 Chronic idiopathic constipation: Secondary | ICD-10-CM | POA: Diagnosis not present

## 2020-02-04 DIAGNOSIS — F0390 Unspecified dementia without behavioral disturbance: Secondary | ICD-10-CM | POA: Insufficient documentation

## 2020-02-04 DIAGNOSIS — J302 Other seasonal allergic rhinitis: Secondary | ICD-10-CM | POA: Insufficient documentation

## 2020-02-04 LAB — CUP PACEART REMOTE DEVICE CHECK
Battery Remaining Longevity: 42 mo
Battery Remaining Percentage: 63 %
Brady Statistic RA Percent Paced: 0 %
Brady Statistic RV Percent Paced: 100 %
Date Time Interrogation Session: 20211028072700
HighPow Impedance: 54 Ohm
Implantable Lead Implant Date: 20080116
Implantable Lead Implant Date: 20080116
Implantable Lead Implant Date: 20080116
Implantable Lead Location: 753859
Implantable Lead Location: 753860
Implantable Lead Location: 753860
Implantable Lead Model: 157
Implantable Lead Model: 4469
Implantable Lead Model: 4555
Implantable Lead Serial Number: 136532
Implantable Lead Serial Number: 161542
Implantable Lead Serial Number: 473495
Implantable Pulse Generator Implant Date: 20140911
Lead Channel Impedance Value: 405 Ohm
Lead Channel Impedance Value: 561 Ohm
Lead Channel Impedance Value: 893 Ohm
Lead Channel Pacing Threshold Amplitude: 0.6 V
Lead Channel Pacing Threshold Amplitude: 0.7 V
Lead Channel Pacing Threshold Amplitude: 0.8 V
Lead Channel Pacing Threshold Pulse Width: 0.4 ms
Lead Channel Pacing Threshold Pulse Width: 0.4 ms
Lead Channel Pacing Threshold Pulse Width: 0.8 ms
Lead Channel Setting Pacing Amplitude: 2 V
Lead Channel Setting Pacing Amplitude: 2 V
Lead Channel Setting Pacing Amplitude: 2.4 V
Lead Channel Setting Pacing Pulse Width: 0.4 ms
Lead Channel Setting Pacing Pulse Width: 0.8 ms
Lead Channel Setting Sensing Sensitivity: 0.6 mV
Lead Channel Setting Sensing Sensitivity: 1 mV
Pulse Gen Serial Number: 111765

## 2020-02-04 MED ORDER — FLUTICASONE PROPIONATE 50 MCG/ACT NA SUSP: 2.0000 | Freq: Every day | NASAL | 3 refills | Status: DC

## 2020-02-04 NOTE — Telephone Encounter (Signed)
Pt called in and stated she is having some trouble with her Bi V ICD-[employer].  She would to speak to Device to see if someone could help her   Best number [phone]

## 2020-02-04 NOTE — Progress Notes (Signed)
Location:  [facility] clinic Provider: Martie Muhlbauer L. Mariea Clonts, D.O., C.M.D.  Code Status: DNR Goals of Care:  Advanced Directives 02/04/2020  Does Patient Have a Medical Advance Directive? Yes  Type of Advance Directive Living will;Out of facility DNR (pink MOST or yellow form)  Does patient want to make changes to medical advance directive? No - Patient declined  Copy of [place] in Chart? -  Pre-existing out of facility DNR order (yellow form or pink MOST form) Pink MOST form placed in chart (order not valid for inpatient use)     Chief Complaint  Patient presents with  . Acute Visit    Follow up on urine retention, it is still the same she stills feel pressure     HPI: Patient is a 86 y.o. female seen today for an acute visit for f/u on urinary retention.  She continues to feel pressure across her lower abdomen, there most of the time.  There is some relief when she urinates.    She gets blocked, congestion--uses vicks inhaler.  She wakes up hoarse in the morning.    She uses tylenol and a series of exercises.  Uses the 500 if she is very uncomfortable.  If not, she waits until bedtime and takes 650mg .     She's taking senokot-s.  Bowels are moving a little better.  She had little balls until this morning.  She does have cereal and eats spinach, tangerines.  She had more water yesterday.    We also discussed their recent experience at neurology.  She has tried the medications that are available for her dementia, and there are not others that are helpful at this point.  We discussed conservative interventions with diet, exercise, mental and social stimulation and pt and caregiver support programs available as needed in the future.  Past Medical History:  Diagnosis Date  . Arthritis   . Cancer [facility])    left breast cancer   . Cataracts, bilateral    removed by surgery  . CHF (congestive heart failure) ([place])    PACEMAKER & DEFIB  . Complication of anesthesia     hypotensive after back surgery in 2006  . Depression   . Dyslipidemia   . Fainted 04/21/06   AT [place]  . GERD (gastroesophageal reflux disease)   . Headache(784.0)   . Hearing loss    bilateral hearing aids  . HLD (hyperlipidemia)    diet controlled   . Hypertension   . Hypothyroidism   . ICD (implantable cardiac defibrillator) in place    pt has pacer/icd  . ICD (implantable cardiac defibrillator), biventricular, in situ   . LBBB (left bundle branch block)   . Memory loss   . Nonischemic cardiomyopathy ([place])   . Normal coronary arteries    s/p cardiac cath 2007  . Pacemaker    ICD [employer]  . Syncope   . Systolic CHF ([place])   . Vertigo   . Wears glasses     Past Surgical History:  Procedure Laterality Date  . BACK SURGERY     lumbar fusion   . BREAST LUMPECTOMY Left 2008  . BREAST SURGERY  2000   LUMP REMOVAL. STAGE 1 CANCER  . CARDIAC CATHETERIZATION    . CATARACT EXTRACTION    . COLONOSCOPY    . EYE SURGERY    . IMPLANTABLE CARDIOVERTER DEFIBRILLATOR GENERATOR CHANGE N/A 12/18/2012   Procedure: IMPLANTABLE CARDIOVERTER DEFIBRILLATOR GENERATOR CHANGE;  Surgeon: [name], MD;  Location:  [facility] CATH [facility];  Service: Cardiovascular;  Laterality: N/A;  . JOINT REPLACEMENT  06/14/01   right  . LUMBAR FUSION  2006  . MASS EXCISION  11/08/2011   Procedure: EXCISION MASS;  Surgeon: [name], MD;  Location: [facility];  Service: General;  Laterality: Left;  Excision Left Thigh Mass  . MASTECTOMY W/ SENTINEL NODE BIOPSY Left 06/04/2019   Procedure: LEFT MASTECTOMY WITH SENTINEL LYMPH NODE BIOPSY;  Surgeon: [name], MD;  Location: [place];  Service: General;  Laterality: Left;  Marland Kitchen MASTECTOMY, PARTIAL  2008   GOT PACEMAKER AND DEFIB AT THAT TIME  . PACEMAKER INSERTION  04/23/06  . TOTAL KNEE ARTHROPLASTY  05/17/01   RIGHT KNEE  . TOTAL KNEE ARTHROPLASTY Left 11/29/2014   Procedure: TOTAL LEFT KNEE ARTHROPLASTY;  Surgeon: Paralee Cancel, MD;  Location: [facility];  Service:  Orthopedics;  Laterality: Left;    Allergies  Allergen Reactions  . Iodine Shortness Of Breath    Iodine contrast, CHF , SOB  . Shellfish Allergy Shortness Of Breath  . Memantine     Malaise, fogginess/ couldn't think  . Aspirin Nausea And Vomiting  . Codeine Nausea And Vomiting    Outpatient Encounter Medications as of 02/04/2020  Medication Sig  . acetaminophen (TYLENOL) 500 MG tablet Take 500 mg by mouth as needed.  Marland Kitchen acetaminophen (TYLENOL) 650 MG CR tablet Take 650 mg by mouth every 8 (eight) hours as needed for pain.  Marland Kitchen amLODipine (NORVASC) 5 MG tablet Take 1 tablet (5 mg total) by mouth daily.  Marland Kitchen ascorbic acid (VITAMIN C) 500 MG tablet Take 500 mg by mouth daily.  . carvedilol (COREG) 3.125 MG tablet TAKE 1 TABLET BY MOUTH  TWICE DAILY  . Cholecalciferol (VITAMIN D3) 50 MCG (2000 UT) TABS Take 2,000 Units by mouth daily.   . furosemide (LASIX) 20 MG tablet TAKE 1 TABLET BY MOUTH  DAILY  . levothyroxine (SYNTHROID, LEVOTHROID) 100 MCG tablet Take 1 tablet (100 mcg total) by mouth daily before breakfast.  . loratadine-pseudoephedrine (CLARITIN-D 12-HOUR) 5-120 MG tablet Take 1 tablet by mouth daily.  . Magnesium 250 MG TABS Take 250 mg by mouth daily.  . meclizine (ANTIVERT) 12.5 MG tablet Take 1 tablet (12.5 mg total) by mouth 3 (three) times daily as needed for dizziness.  . methocarbamol (ROBAXIN) 500 MG tablet Take 1 tablet (500 mg total) by mouth every 8 (eight) hours as needed for muscle spasms.  . NONFORMULARY OR COMPOUNDED ITEM Apply 120 Tubes topically daily. Diclofenac/Cyclobenzaprine/lamotrigine/lidocaine/prilocaine (10480) 2%/2%/6%/5%/1.25% cream QTY: 120 GM SIG: (NEURO) APPLY 1-2 PUMPS (1-2 GMS) TO AFFECTED AREA (S) OR FOCAL POINTS 3 TO 4 TIMES DAILY. RUB IN FOR 2 MINUTES TO ACHIEVE MAX PENETRATION. [place] HANDS WELL.  . Omega-3 Fatty Acids (FISH OIL PO) Take 1 capsule by mouth daily.  Marland Kitchen omeprazole (PRILOSEC) 40 MG capsule TAKE 1 CAPSULE BY MOUTH  ONCE DAILY FOR  STOMACH  . Oxcarbazepine (TRILEPTAL) 300 MG tablet Take 300 mg by mouth daily.   . potassium chloride (KLOR-CON) 10 MEQ tablet TAKE 1 TABLET BY MOUTH  DAILY  . senna-docusate (SENOKOT S) 8.6-50 MG tablet Take 2 tablets by mouth daily.  . sertraline (ZOLOFT) 100 MG tablet Take 200 mg by mouth daily.   Marland Kitchen spironolactone (ALDACTONE) 25 MG tablet TAKE 1 TABLET BY MOUTH IN  THE MORNING  . Vitamin E 400 units TABS Take 400 Units by mouth daily.    No facility-administered encounter medications on file as of 02/04/2020.  Review of Systems:  Review of Systems  Constitutional: Negative for chills, fever and malaise/fatigue.  Eyes: Negative for blurred vision.  Respiratory: Negative for cough and shortness of breath.   Cardiovascular: Negative for chest pain, palpitations and leg swelling.  Gastrointestinal: Positive for abdominal pain. Negative for blood in stool, constipation, diarrhea and melena.       Suprapubic pressure  Genitourinary: Negative for dysuria, flank pain, frequency, hematuria and urgency.  Musculoskeletal: Positive for back pain. Negative for falls.    Health Maintenance  Topic Date Due  . MAMMOGRAM  03/15/2020  . TETANUS/TDAP  01/08/2023  . INFLUENZA VACCINE  Completed  . DEXA SCAN  Completed  . COVID-19 Vaccine  Completed  . PNA vac Low Risk Adult  Completed    Physical Exam: Vitals:   02/04/20 1005  BP: 122/76  Pulse: 74  Temp: 97.7 F (36.5 C)  TempSrc: Temporal  SpO2: 97%  Weight: 121 lb 1.6 oz (54.9 kg)  Height: 4\' 10"  (1.473 m)   Body mass index is 25.31 kg/m. Physical Exam Vitals reviewed.  Constitutional:      Appearance: Normal appearance.  Cardiovascular:     Rate and Rhythm: Normal rate and regular rhythm.  Pulmonary:     Effort: Pulmonary effort is normal.     Breath sounds: Normal breath sounds. No rales.  Abdominal:     General: Bowel sounds are normal.     Palpations: Abdomen is soft.     Tenderness: There is abdominal tenderness.       Comments: Over suprapubic area only  Musculoskeletal:        General: Normal range of motion.     Right lower leg: No edema.     Left lower leg: No edema.  Skin:    General: Skin is warm and dry.  Neurological:     General: No focal deficit present.     Mental Status: She is alert.     Comments: Pleasant and responses were accurate and appropriate majority of time, daughter, [name] helps  Psychiatric:        Mood and Affect: Mood normal.     Labs reviewed: Basic Metabolic Panel: Recent Labs    04/15/19 1119 06/05/19 0342 07/17/19 1621 07/17/19 1621 11/06/19 1523 12/23/19 0859 01/28/20 0829  NA   < > 140 140   < > 140 139 141  K   < > 3.8 4.5   < > 3.9 4.3 4.5  CL   < > 105 106   < > 106 104 102  CO2   < > 27 27   < > 28 29 33*  GLUCOSE   < > 91 100   < > 126 94 91  BUN   < > 10 31*   < > 30* 26* 31*  CREATININE   < > 0.89 0.83   < > 0.83 0.91 0.95*  CALCIUM   < > 8.6* 10.2   < > 10.4 10.1 10.5*  MG  --  2.1  --   --   --   --   --   PHOS  --  1.4*  --   --   --   --   --   TSH  --   --  1.52  --   --   --   --    < > = values in this interval not displayed.   Liver Function Tests: Recent Labs    06/01/19 1221 06/01/19 1221  07/17/19 1621 12/23/19 0859 01/28/20 0829  AST 30   < > 20 26 26   ALT 26   < > 17 24 27   ALKPHOS 54  --   --  69  --   BILITOT 0.5   < > 0.2 0.3 0.4  PROT 6.9   < > 6.6 7.6 7.2  ALBUMIN 3.7  --   --  3.7  --    < > = values in this interval not displayed.   No results for input(s): LIPASE, AMYLASE in the last 8760 hours. No results for input(s): AMMONIA in the last 8760 hours. CBC: Recent Labs    12/23/19 0859 01/12/20 1604 01/28/20 0829  WBC 8.4 9.1 8.1  NEUTROABS 5.4 5,915 5,095  HGB 12.7 12.7 13.6  HCT 39.1 38.0 41.2  MCV 84.8 85.4 86.2  PLT 131* 129* 120*   Lipid Panel: No results for input(s): CHOL, HDL, LDLCALC, TRIG, CHOLHDL, LDLDIRECT in the last 8760 hours. No results found for: HGBA1C  Procedures since last  visit: DG Lumbar Spine 2-3 Views  Result Date: 01/29/2020 CLINICAL DATA:  Lower back pain and leg weakness. EXAM: LUMBAR SPINE - 2-3 VIEW COMPARISON:  None. FINDINGS: There is no evidence of acute lumbar spine fracture. Bilateral radiopaque pedicle screws are seen at the levels of L4 and L5. Approximately 2 mm retrolisthesis of the L1 vertebral body is noted on L2 with 2 mm retrolisthesis of the L2 vertebral body on L3. Moderate to marked severity endplate sclerosis is seen throughout the lumbar spine. Moderate severity intervertebral disc space narrowing is seen at the levels of L2-L3, L3-L4. IMPRESSION: 1. Postoperative changes in the lower lumbar spine. 2. Advanced multilevel degenerative disc disease throughout the lumbar spine with very mild retrolisthesis of the L1 and L2 vertebral bodies. Electronically Signed   By: Virgina Norfolk M.D.   On: 01/29/2020 19:47   CUP PACEART REMOTE DEVICE CHECK  Result Date: 02/04/2020 Scheduled remote reviewed. Normal device function.  There were two atrial arrhythmias detected that lasted less than one minutes and one short NSVT arrhythmia detected that appears to be supraventricular in nature. Next remote 91 days. Kathy Breach, RN, CCDS, [employer]  VAS [place] LOWER EXTREMITY VENOUS (DVT)  Result Date: 01/13/2020  Lower Venous DVTStudy Indications: Lumps in the posterior calf.  Performing Technologist: Ralene Cork RVT  Examination Guidelines: A complete evaluation includes B-mode imaging, spectral Doppler, color Doppler, and power Doppler as needed of all accessible portions of each vessel. Bilateral testing is considered an integral part of a complete examination. Limited examinations for reoccurring indications may be performed as noted. The reflux portion of the exam is performed with the patient in reverse Trendelenburg.  +---------+---------------+---------+-----------+----------+--------------+ RIGHT     CompressibilityPhasicitySpontaneityPropertiesThrombus Aging +---------+---------------+---------+-----------+----------+--------------+ CFV      Full           Yes      Yes                                 +---------+---------------+---------+-----------+----------+--------------+ SFJ      Full                    Yes                                 +---------+---------------+---------+-----------+----------+--------------+ FV Prox  Full  Yes      Yes                                 +---------+---------------+---------+-----------+----------+--------------+ FV Mid   Full           Yes      Yes                                 +---------+---------------+---------+-----------+----------+--------------+ FV DistalFull           Yes      Yes                                 +---------+---------------+---------+-----------+----------+--------------+ POP      Full           Yes      Yes                                 +---------+---------------+---------+-----------+----------+--------------+ PTV      Full                    Yes                                 +---------+---------------+---------+-----------+----------+--------------+ PERO     Full                    Yes                                 +---------+---------------+---------+-----------+----------+--------------+ GSV      Full           Yes      Yes                                 +---------+---------------+---------+-----------+----------+--------------+  Findings reported to Rodena Piety at 11:25 am.  Summary: RIGHT: - There is no evidence of deep vein thrombosis in the lower extremity. - There is no evidence of superficial venous thrombosis.   *See table(s) above for measurements and observations. Electronically signed by Deitra Mayo MD on 01/13/2020 at 1:01:12 PM.    Final     Assessment/Plan 1. Urinary retention -d/c claritin-D which may be contributing to symptoms--using  about 3 mos and problems began about 2 mos ago -use flonase instead for the seasonal allergies -if not improvement in one week with pressure sensation, try pyridium for a 2 day course (azo otc), but don't continue long-term due to potential renal side effects -keep bowels moving -keep urology f/u as planned in about 4 wks  2. Chronic idiopathic constipation -cont senna s two tablets daily to help w/ hard stools -eating plenty of fiber and hydrating better now which I encouraged more -stay active -add miralax if no bm over 2 days which has not been an issue  3. Chronic right-sided low back pain without sciatica -cont tylenol, PT exercises  4.  Dementia -did not tolerate aricept, namenda and exelon -reviewed with her and her daughter conservative interventions and support available  Labs/tests ordered:  No new Next appt:  03/11/2020  Urijah Arko L. [place], D.O. [facility] [address], [place] [address] Cell Phone (Mon-Fri 8am-5pm):  [phone] On Call:  [phone] & follow prompts after 5pm & weekends Office Phone:  [phone] Office Fax:  [phone]

## 2020-02-04 NOTE — Telephone Encounter (Signed)
Returning patients phone call. Patient states she is having issues with monitor. States lights on her monitor are orange. Advised patient she will need to call [employer] to have help with monitor. Requested patient call DC back after she finds out what is wrong with the monitor.  Direct number provided. Agreeable to plan.

## 2020-02-04 NOTE — Patient Instructions (Addendum)
Stop claritin-D Try flonase nasal spray for allergies.    If you still have pressure after one week, try pyridium (azo) over the counter for 2 days.  It will make your urine orange but may help your symptoms.  This is short-term.  Long-term it can begin to affect your kidneys.

## 2020-02-08 NOTE — Telephone Encounter (Signed)
Looks like a transmission came in on 02/04/2020

## 2020-02-09 NOTE — Progress Notes (Signed)
I have reviewed and agreed above plan. 

## 2020-02-09 NOTE — Progress Notes (Signed)
Remote ICD transmission.   

## 2020-02-11 ENCOUNTER — Telehealth: Payer: Self-pay

## 2020-02-11 NOTE — Telephone Encounter (Signed)
Shona Needles, PT called and stated that patient is complaining of the "worst back pain she's ever had" today and that when trying to stand up from a seated position on the couch her back "spasmed."  The therapist stated these were new symptoms for the patient and she wanted to let us know.

## 2020-02-11 NOTE — Telephone Encounter (Signed)
Will need an office visit but if pain is worst will need to be evaluated in urgent care or ED.

## 2020-02-12 NOTE — Telephone Encounter (Signed)
Patient has an appointment on 11/8 with Dr. Mariea Clonts, as [facility] has no openings.

## 2020-02-15 ENCOUNTER — Encounter: Payer: Self-pay | Admitting: Internal Medicine

## 2020-02-15 ENCOUNTER — Other Ambulatory Visit: Payer: Self-pay

## 2020-02-15 ENCOUNTER — Ambulatory Visit (INDEPENDENT_AMBULATORY_CARE_PROVIDER_SITE_OTHER): Payer: Medicare Other | Admitting: Internal Medicine

## 2020-02-15 VITALS — BP 122/62 | HR 63 | Temp 96.9°F | Ht <= 58 in | Wt 122.0 lb

## 2020-02-15 DIAGNOSIS — M545 Low back pain, unspecified: Secondary | ICD-10-CM | POA: Diagnosis not present

## 2020-02-15 DIAGNOSIS — M79605 Pain in left leg: Secondary | ICD-10-CM

## 2020-02-15 MED ORDER — GABAPENTIN 100 MG PO CAPS: 100.0000 mg | ORAL_CAPSULE | Freq: Every day | ORAL | 1 refills | Status: DC

## 2020-02-15 MED ORDER — TIZANIDINE HCL 2 MG PO TABS: 2.0000 mg | ORAL_TABLET | Freq: Every day | ORAL | 0 refills | Status: DC | PRN

## 2020-02-15 NOTE — Progress Notes (Signed)
Location:  [facility] clinic Provider: Audrie Kuri L. Mariea Clonts, D.O., C.M.D.  Code Status: DNR Goals of Care:  Advanced Directives 02/15/2020  Does Patient Have a Medical Advance Directive? Yes  Type of Advance Directive Out of facility DNR (pink MOST or yellow form)  Does patient want to make changes to medical advance directive? No - Patient declined  Copy of [place] in Chart? -  Pre-existing out of facility DNR order (yellow form or pink MOST form) Pink MOST/Yellow Form most recent copy in chart - Physician notified to receive inpatient order     Chief Complaint  Patient presents with  . Acute Visit    Back pain that is spreading into her hips, spasms during PT     HPI: Patient is a 86 y.o. female seen today for an acute visit for back pain spreading into her hips and spasms during PT last week.   Right now pain is 5-6/10.  She told her daughter it was the worst pain she'd ever felt during therapy.  Not using a muscle relaxer right now.   Continues her tylenol.    Urinating better.  Still gets pressure sensation.  F/u is Dec 2nd.  Recommended she keep that appt.    Past Medical History:  Diagnosis Date  . Arthritis   . Cancer [facility])    left breast cancer   . Cataracts, bilateral    removed by surgery  . CHF (congestive heart failure) ([place])    PACEMAKER & DEFIB  . Complication of anesthesia    hypotensive after back surgery in 2006  . Depression   . Dyslipidemia   . Fainted 04/21/06   AT [place]  . GERD (gastroesophageal reflux disease)   . Headache(784.0)   . Hearing loss    bilateral hearing aids  . HLD (hyperlipidemia)    diet controlled   . Hypertension   . Hypothyroidism   . ICD (implantable cardiac defibrillator) in place    pt has pacer/icd  . ICD (implantable cardiac defibrillator), biventricular, in situ   . LBBB (left bundle branch block)   . Memory loss   . Nonischemic cardiomyopathy ([place])   . Normal coronary arteries    s/p cardiac cath 2007   . Pacemaker    ICD [employer]  . Syncope   . Systolic CHF ([place])   . Vertigo   . Wears glasses     Past Surgical History:  Procedure Laterality Date  . BACK SURGERY     lumbar fusion   . BREAST LUMPECTOMY Left 2008  . BREAST SURGERY  2000   LUMP REMOVAL. STAGE 1 CANCER  . CARDIAC CATHETERIZATION    . CATARACT EXTRACTION    . COLONOSCOPY    . EYE SURGERY    . IMPLANTABLE CARDIOVERTER DEFIBRILLATOR GENERATOR CHANGE N/A 12/18/2012   Procedure: IMPLANTABLE CARDIOVERTER DEFIBRILLATOR GENERATOR CHANGE;  Surgeon: [name], MD;  Location: [facility] CATH LAB;  Service: Cardiovascular;  Laterality: N/A;  . JOINT REPLACEMENT  06/14/01   right  . LUMBAR FUSION  2006  . MASS EXCISION  11/08/2011   Procedure: EXCISION MASS;  Surgeon: [name], MD;  Location: [facility];  Service: General;  Laterality: Left;  Excision Left Thigh Mass  . MASTECTOMY W/ SENTINEL NODE BIOPSY Left 06/04/2019   Procedure: LEFT MASTECTOMY WITH SENTINEL LYMPH NODE BIOPSY;  Surgeon: [name], MD;  Location: [facility];  Service: General;  Laterality: Left;  Marland Kitchen MASTECTOMY, PARTIAL  2008   GOT PACEMAKER AND  DEFIB AT THAT TIME  . PACEMAKER INSERTION  04/23/06  . TOTAL KNEE ARTHROPLASTY  05/17/01   RIGHT KNEE  . TOTAL KNEE ARTHROPLASTY Left 11/29/2014   Procedure: TOTAL LEFT KNEE ARTHROPLASTY;  Surgeon: Paralee Cancel, MD;  Location: [facility];  Service: Orthopedics;  Laterality: Left;    Allergies  Allergen Reactions  . Iodine Shortness Of Breath    Iodine contrast, CHF , SOB  . Shellfish Allergy Shortness Of Breath  . Memantine     Malaise, fogginess/ couldn't think  . Aspirin Nausea And Vomiting  . Codeine Nausea And Vomiting    Outpatient Encounter Medications as of 02/15/2020  Medication Sig  . acetaminophen (TYLENOL) 500 MG tablet Take 500 mg by mouth as needed.  Marland Kitchen acetaminophen (TYLENOL) 650 MG CR tablet Take 650 mg by mouth every 8 (eight) hours as needed for pain.  Marland Kitchen amLODipine (NORVASC) 5 MG tablet Take 1  tablet (5 mg total) by mouth daily.  Marland Kitchen ascorbic acid (VITAMIN C) 500 MG tablet Take 500 mg by mouth daily.  . carvedilol (COREG) 3.125 MG tablet TAKE 1 TABLET BY MOUTH  TWICE DAILY  . Cholecalciferol (VITAMIN D3) 50 MCG (2000 UT) TABS Take 2,000 Units by mouth daily.   . fluticasone (FLONASE) 50 MCG/ACT nasal spray Place 2 sprays into both nostrils daily.  . furosemide (LASIX) 20 MG tablet TAKE 1 TABLET BY MOUTH  DAILY  . levothyroxine (SYNTHROID, LEVOTHROID) 100 MCG tablet Take 1 tablet (100 mcg total) by mouth daily before breakfast.  . Magnesium 250 MG TABS Take 250 mg by mouth daily.  . meclizine (ANTIVERT) 12.5 MG tablet Take 1 tablet (12.5 mg total) by mouth 3 (three) times daily as needed for dizziness.  . methocarbamol (ROBAXIN) 500 MG tablet Take 1 tablet (500 mg total) by mouth every 8 (eight) hours as needed for muscle spasms.  . NONFORMULARY OR COMPOUNDED ITEM Apply 120 Tubes topically daily. Diclofenac/Cyclobenzaprine/lamotrigine/lidocaine/prilocaine (10480) 2%/2%/6%/5%/1.25% cream QTY: 120 GM SIG: (NEURO) APPLY 1-2 PUMPS (1-2 GMS) TO AFFECTED AREA (S) OR FOCAL POINTS 3 TO 4 TIMES DAILY. RUB IN FOR 2 MINUTES TO ACHIEVE MAX PENETRATION. [place] HANDS WELL.  . Omega-3 Fatty Acids (FISH OIL PO) Take 1 capsule by mouth daily.  Marland Kitchen omeprazole (PRILOSEC) 40 MG capsule TAKE 1 CAPSULE BY MOUTH  ONCE DAILY FOR STOMACH  . Oxcarbazepine (TRILEPTAL) 300 MG tablet Take 300 mg by mouth daily.   . potassium chloride (KLOR-CON) 10 MEQ tablet TAKE 1 TABLET BY MOUTH  DAILY  . senna-docusate (SENOKOT S) 8.6-50 MG tablet Take 2 tablets by mouth daily.  . sertraline (ZOLOFT) 100 MG tablet Take 200 mg by mouth daily.   Marland Kitchen spironolactone (ALDACTONE) 25 MG tablet TAKE 1 TABLET BY MOUTH IN  THE MORNING  . Vitamin E 400 units TABS Take 400 Units by mouth daily.    No facility-administered encounter medications on file as of 02/15/2020.    Review of Systems:  Review of Systems  Constitutional: Negative for  chills, fever and malaise/fatigue.  HENT: Negative for congestion and sore throat.   Respiratory: Negative for cough and shortness of breath.   Cardiovascular: Negative for chest pain and palpitations.  Gastrointestinal: Negative for abdominal pain and constipation.  Genitourinary: Negative for dysuria.       Still getting pressure sensation  Musculoskeletal: Positive for back pain and myalgias. Negative for falls.  Neurological: Positive for tingling and sensory change. Negative for dizziness, focal weakness and loss of consciousness.  Psychiatric/Behavioral: Positive for memory loss.  Health Maintenance  Topic Date Due  . MAMMOGRAM  03/15/2020  . TETANUS/TDAP  01/08/2023  . INFLUENZA VACCINE  Completed  . DEXA SCAN  Completed  . COVID-19 Vaccine  Completed  . PNA vac Low Risk Adult  Completed    Physical Exam: Vitals:   02/15/20 1513  BP: 122/62  Pulse: 63  Temp: (!) 96.9 F (36.1 C)  TempSrc: Temporal  SpO2: 98%  Weight: 122 lb (55.3 kg)  Height: 4\' 10"  (1.473 m)   Body mass index is 25.5 kg/m. Physical Exam Vitals reviewed.  Constitutional:      Appearance: Normal appearance.  Cardiovascular:     Rate and Rhythm: Normal rate and regular rhythm.     Pulses: Normal pulses.     Heart sounds: Normal heart sounds.  Pulmonary:     Effort: Pulmonary effort is normal.  Abdominal:     General: Bowel sounds are normal.     Tenderness: There is no abdominal tenderness.  Musculoskeletal:        General: Tenderness present. No swelling. Normal range of motion.     Comments: Left lower lumbar and sacroiliac tenderness and into left buttock  Neurological:     General: No focal deficit present.     Mental Status: She is alert and oriented to person, place, and time.     Motor: No weakness.  Psychiatric:        Mood and Affect: Mood normal.     Labs reviewed: Basic Metabolic Panel: Recent Labs    04/15/19 1119 06/05/19 0342 07/17/19 1621 07/17/19 1621  11/06/19 1523 12/23/19 0859 01/28/20 0829  NA   < > 140 140   < > 140 139 141  K   < > 3.8 4.5   < > 3.9 4.3 4.5  CL   < > 105 106   < > 106 104 102  CO2   < > 27 27   < > 28 29 33*  GLUCOSE   < > 91 100   < > 126 94 91  BUN   < > 10 31*   < > 30* 26* 31*  CREATININE   < > 0.89 0.83   < > 0.83 0.91 0.95*  CALCIUM   < > 8.6* 10.2   < > 10.4 10.1 10.5*  MG  --  2.1  --   --   --   --   --   PHOS  --  1.4*  --   --   --   --   --   TSH  --   --  1.52  --   --   --   --    < > = values in this interval not displayed.   Liver Function Tests: Recent Labs    06/01/19 1221 06/01/19 1221 07/17/19 1621 12/23/19 0859 01/28/20 0829  AST 30   < > 20 26 26   ALT 26   < > 17 24 27   ALKPHOS 54  --   --  69  --   BILITOT 0.5   < > 0.2 0.3 0.4  PROT 6.9   < > 6.6 7.6 7.2  ALBUMIN 3.7  --   --  3.7  --    < > = values in this interval not displayed.   No results for input(s): LIPASE, AMYLASE in the last 8760 hours. No results for input(s): AMMONIA in the last 8760 hours. CBC: Recent Labs    12/23/19 0859  01/12/20 1604 01/28/20 0829  WBC 8.4 9.1 8.1  NEUTROABS 5.4 5,915 5,095  HGB 12.7 12.7 13.6  HCT 39.1 38.0 41.2  MCV 84.8 85.4 86.2  PLT 131* 129* 120*   Lipid Panel: No results for input(s): CHOL, HDL, LDLCALC, TRIG, CHOLHDL, LDLDIRECT in the last 8760 hours. No results found for: HGBA1C  Procedures since last visit: DG Lumbar Spine 2-3 Views  Result Date: 01/29/2020 CLINICAL DATA:  Lower back pain and leg weakness. EXAM: LUMBAR SPINE - 2-3 VIEW COMPARISON:  None. FINDINGS: There is no evidence of acute lumbar spine fracture. Bilateral radiopaque pedicle screws are seen at the levels of L4 and L5. Approximately 2 mm retrolisthesis of the L1 vertebral body is noted on L2 with 2 mm retrolisthesis of the L2 vertebral body on L3. Moderate to marked severity endplate sclerosis is seen throughout the lumbar spine. Moderate severity intervertebral disc space narrowing is seen at the  levels of L2-L3, L3-L4. IMPRESSION: 1. Postoperative changes in the lower lumbar spine. 2. Advanced multilevel degenerative disc disease throughout the lumbar spine with very mild retrolisthesis of the L1 and L2 vertebral bodies. Electronically Signed   By: Virgina Norfolk M.D.   On: 01/29/2020 19:47   CUP PACEART REMOTE DEVICE CHECK  Result Date: 02/04/2020 Scheduled remote reviewed. Normal device function.  There were two atrial arrhythmias detected that lasted less than one minutes and one short NSVT arrhythmia detected that appears to be supraventricular in nature. Next remote 91 days. Kathy Breach, RN, CCDS, [employer]   Assessment/Plan 1. Low back pain radiating to left lower extremity - having some bilateral radiculopathy now actually  -cont PT - add gabapentin at hs and tizanidine prn--discussed side effect risks -add salonpas WITH lidocaine to left lower back (had gotten w/o lidocaine) - gabapentin (NEURONTIN) 100 MG capsule; Take 1 capsule (100 mg total) by mouth at bedtime.  Dispense: 90 capsule; Refill: 1 - tiZANidine (ZANAFLEX) 2 MG tablet; Take 1 tablet (2 mg total) by mouth daily as needed for muscle spasms.  Dispense: 30 tablet; Refill: 0 -call us back if not improving  Labs/tests ordered:  No new today Next appt:  03/11/2020  Ohm Dentler L. Jonnette Nuon, D.O. [facility] [address], [place] [address] Cell Phone (Mon-Fri 8am-5pm):  [phone] On Call:  [phone] & follow prompts after 5pm & weekends Office Phone:  [phone] Office Fax:  [phone]

## 2020-02-15 NOTE — Patient Instructions (Addendum)
Try salonpas with lidocaine patches on your lower back/upper buttock  I've sent in gabapentin 100mg  to take nightly for the nerve pain from your back Also continue therapy If you have muscle spasms, take tizanidine 2mg  at that time.

## 2020-02-25 ENCOUNTER — Telehealth: Payer: Self-pay

## 2020-02-25 NOTE — Telephone Encounter (Signed)
Patient called and notified. She states that she will stop taking the gabapentin and will follow up with PCP [name], NP on 03/11/2020. I gave patient the option of having a sooner appointment time. But she states that she will stop taking gabapentin and see if this helps with her symptoms. Patient states that she will come into office sooner if symptoms worsen or don't get better.

## 2020-02-25 NOTE — Telephone Encounter (Signed)
Incoming call received form patient stating she seen Dr.Reed last week and was prescribed gabapentin 100 mg by mouth at bedtime. Patient is extremely sleepy during the day, nauseated and dizziness since taking medication. Patient states her back pain is still agitating and she needs some guidance.     Please advise

## 2020-02-25 NOTE — Telephone Encounter (Signed)
I recommend she stop the gabapentin and return to the office to see Janett Billow who is her primary to further address her back pain.

## 2020-03-10 ENCOUNTER — Encounter: Payer: Self-pay | Admitting: Urology

## 2020-03-10 ENCOUNTER — Ambulatory Visit (INDEPENDENT_AMBULATORY_CARE_PROVIDER_SITE_OTHER): Payer: Medicare Other | Admitting: Urology

## 2020-03-10 ENCOUNTER — Other Ambulatory Visit: Payer: Self-pay

## 2020-03-10 VITALS — BP 143/82 | HR 68 | Ht <= 58 in | Wt 120.0 lb

## 2020-03-10 DIAGNOSIS — R339 Retention of urine, unspecified: Secondary | ICD-10-CM

## 2020-03-10 DIAGNOSIS — N3281 Overactive bladder: Secondary | ICD-10-CM

## 2020-03-10 DIAGNOSIS — R351 Nocturia: Secondary | ICD-10-CM

## 2020-03-10 NOTE — Progress Notes (Signed)
   03/10/2020 8:47 AM   [place] 06-11-1933 [id number]  Reason for visit: Follow up urinary symptoms, nocturia  HPI: I saw [name] and her daughter back in urology clinic today for follow-up of her urinary symptoms.  She is a comorbid 86 year old female who I originally saw on 02/03/2020 for urinary symptoms of weak stream and feeling of incomplete emptying.  PVR was normal at 70 mL at that time and urinalysis was benign.  She was also having some trouble with constipation at that time, and I recommended working on her constipation and timed voiding.  Claritin was also discontinued by her PCP which improved some of her urinary symptoms.  However, she now reports that she is having some overactive symptoms overnight with some urgency and frequency, and urge incontinence, only at night.  She is urinating every 2 hours overnight since stopping the Claritin.  She denies any significant urinary symptoms during the day.  She has mild lower extremity edema during the day.  She drinks primarily water.  She has an Ensure in the evening before bed.  She is not taking any diuretics before bedtime.  Much of the history is obtained from her daughter today.  I reviewed recent urine culture from 10/21 that showed no growth, as well as CMP showing normal renal function with GFR greater than 60.  PVR today is normal at 110 mL.   We discussed options at length including behavioral strategies or a trial of Myrbetriq.  She is interested in trying a medication, as her sleep is very disrupted.  I also recommended compression socks during the day, elevating the legs in the afternoon, minimizing fluids before bed, voiding prior to bedtime, and considering having her protein shake earlier in the day.  We discussed the risks and benefits of Myrbetriq at length.  Behavioral strategies discussed Trial of Myrbetriq 25 mg daily RTC 4 weeks with PA for PVR and symptom check  Billey Co, MD  [facility] [address], [place], [place] [address] [phone]

## 2020-03-10 NOTE — Patient Instructions (Signed)

## 2020-03-11 ENCOUNTER — Ambulatory Visit (INDEPENDENT_AMBULATORY_CARE_PROVIDER_SITE_OTHER): Payer: Medicare Other | Admitting: Nurse Practitioner

## 2020-03-11 ENCOUNTER — Encounter: Payer: Self-pay | Admitting: Nurse Practitioner

## 2020-03-11 VITALS — BP 120/80 | HR 73 | Temp 97.4°F | Ht <= 58 in | Wt 121.0 lb

## 2020-03-11 DIAGNOSIS — M545 Low back pain, unspecified: Secondary | ICD-10-CM

## 2020-03-11 DIAGNOSIS — R339 Retention of urine, unspecified: Secondary | ICD-10-CM | POA: Diagnosis not present

## 2020-03-11 DIAGNOSIS — M810 Age-related osteoporosis without current pathological fracture: Secondary | ICD-10-CM

## 2020-03-11 DIAGNOSIS — K5904 Chronic idiopathic constipation: Secondary | ICD-10-CM

## 2020-03-11 DIAGNOSIS — N3281 Overactive bladder: Secondary | ICD-10-CM | POA: Diagnosis not present

## 2020-03-11 DIAGNOSIS — I10 Essential (primary) hypertension: Secondary | ICD-10-CM

## 2020-03-11 DIAGNOSIS — E034 Atrophy of thyroid (acquired): Secondary | ICD-10-CM

## 2020-03-11 DIAGNOSIS — C50512 Malignant neoplasm of lower-outer quadrant of left female breast: Secondary | ICD-10-CM

## 2020-03-11 DIAGNOSIS — F0390 Unspecified dementia without behavioral disturbance: Secondary | ICD-10-CM

## 2020-03-11 DIAGNOSIS — M79605 Pain in left leg: Secondary | ICD-10-CM

## 2020-03-11 LAB — URINALYSIS, COMPLETE
Bilirubin, UA: NEGATIVE
Glucose, UA: NEGATIVE
Ketones, UA: NEGATIVE
Leukocytes,UA: NEGATIVE
Nitrite, UA: NEGATIVE
Protein,UA: NEGATIVE
RBC, UA: NEGATIVE
Specific Gravity, UA: 1.02 (ref 1.005–1.030)
Urobilinogen, Ur: 0.2 mg/dL (ref 0.2–1.0)
pH, UA: 6 (ref 5.0–7.5)

## 2020-03-11 LAB — MICROSCOPIC EXAMINATION: Bacteria, UA: NONE SEEN

## 2020-03-11 MED ORDER — MIRABEGRON ER 25 MG PO TB24: 25.0000 mg | ORAL_TABLET | Freq: Every day | ORAL | 2 refills | Status: DC

## 2020-03-11 NOTE — Progress Notes (Signed)
Careteam: Patient Care Team: [name], NP as PCP - General (Geriatric Medicine) [name], MD as Consulting Physician (General Surgery) Paralee Cancel, MD as Consulting Physician (Orthopedic Surgery) Marica Otter, [place] (Optometry) Marcial Pacas, MD as Consulting Physician (Neurology) [name], MD as Consulting Physician (Cardiology) Ricard Dillon, MD (Psychiatry)  PLACE OF SERVICE:  [facility] Directive information Does Patient Have a Medical Advance Directive?: Yes, Type of Advance Directive: Living will;Out of facility DNR (pink MOST or yellow form), Pre-existing out of facility DNR order (yellow form or pink MOST form): Yellow form placed in chart (order not valid for inpatient use), Does patient want to make changes to medical advance directive?: No - Patient declined  Allergies  Allergen Reactions  . Iodine Shortness Of Breath    Iodine contrast, CHF , SOB  . Shellfish Allergy Shortness Of Breath  . Memantine     Malaise, fogginess/ couldn't think  . Aspirin Nausea And Vomiting  . Codeine Nausea And Vomiting    Chief Complaint  Patient presents with  . Medical Management of Chronic Issues    4 month follow up. Also following up on last appointment with Dr. Mariea Clonts when Janett Billow was out.     HPI: Patient is a 86 y.o. female for follow up.   Went to urologist due to urinary retention, stopped claritin and which improved some of her urinary symptoms.  However, she now reports that she is having some overactive symptoms overnight with some urgency and frequency, and urge incontinence she was prescirbed myrebtriq but has not started.   Saw Dr Mariea Clonts due to back pain, in PT which is helping.  She prescribed gabapentin but causes dizziness and "out of it"   Constipation- ongoing, started on senokot S 2 tablets daily  Did not help.   Breast cancer- s/p mastecomy followed by oncologist every 6 months for breast exam and labs  Lymphedema has  improved with time and sleeve.   Dizziness- ongoing and "little better" with moving slower   Depression- continues to be depressed at times. Mild-moderate. Her husband died last year. Follows with psychiatrist who prescribes zoloft.   osteoporosis continues on vit d with prolia injection.  Review of Systems:  Review of Systems  Constitutional: Negative for chills, fever and weight loss.  HENT: Negative for tinnitus.   Respiratory: Negative for cough, sputum production and shortness of breath.   Cardiovascular: Negative for chest pain, palpitations and leg swelling.  Gastrointestinal: Positive for constipation. Negative for abdominal pain, diarrhea and heartburn.  Genitourinary: Negative for dysuria, frequency and urgency.  Musculoskeletal: Positive for back pain. Negative for falls, joint pain and myalgias.  Skin: Negative.   Neurological: Positive for dizziness. Negative for headaches.  Psychiatric/Behavioral: Positive for depression. Negative for memory loss. The patient does not have insomnia.     Past Medical History:  Diagnosis Date  . Arthritis   . Cancer [facility])    left breast cancer   . Cataracts, bilateral    removed by surgery  . CHF (congestive heart failure) ([place])    PACEMAKER & DEFIB  . Complication of anesthesia    hypotensive after back surgery in 2006  . Depression   . Dyslipidemia   . Fainted 04/21/06   AT [place]  . GERD (gastroesophageal reflux disease)   . Headache(784.0)   . Hearing loss    bilateral hearing aids  . HLD (hyperlipidemia)    diet controlled   . Hypertension   .  Hypothyroidism   . ICD (implantable cardiac defibrillator) in place    pt has pacer/icd  . ICD (implantable cardiac defibrillator), biventricular, in situ   . LBBB (left bundle branch block)   . Memory loss   . Nonischemic cardiomyopathy ([place])   . Normal coronary arteries    s/p cardiac cath 2007  . Pacemaker    ICD [employer]  . Syncope   . Systolic CHF ([place])   .  Vertigo   . Wears glasses    Past Surgical History:  Procedure Laterality Date  . BACK SURGERY     lumbar fusion   . BREAST LUMPECTOMY Left 2008  . BREAST SURGERY  2000   LUMP REMOVAL. STAGE 1 CANCER  . CARDIAC CATHETERIZATION    . CATARACT EXTRACTION    . COLONOSCOPY    . EYE SURGERY    . IMPLANTABLE CARDIOVERTER DEFIBRILLATOR GENERATOR CHANGE N/A 12/18/2012   Procedure: IMPLANTABLE CARDIOVERTER DEFIBRILLATOR GENERATOR CHANGE;  Surgeon: [name], MD;  Location: [facility] CATH LAB;  Service: Cardiovascular;  Laterality: N/A;  . JOINT REPLACEMENT  06/14/01   right  . LUMBAR FUSION  2006  . MASS EXCISION  11/08/2011   Procedure: EXCISION MASS;  Surgeon: [name], MD;  Location: [facility];  Service: General;  Laterality: Left;  Excision Left Thigh Mass  . MASTECTOMY W/ SENTINEL NODE BIOPSY Left 06/04/2019   Procedure: LEFT MASTECTOMY WITH SENTINEL LYMPH NODE BIOPSY;  Surgeon: [name], MD;  Location: [place];  Service: General;  Laterality: Left;  Marland Kitchen MASTECTOMY, PARTIAL  2008   GOT PACEMAKER AND DEFIB AT THAT TIME  . PACEMAKER INSERTION  04/23/06  . TOTAL KNEE ARTHROPLASTY  05/17/01   RIGHT KNEE  . TOTAL KNEE ARTHROPLASTY Left 11/29/2014   Procedure: TOTAL LEFT KNEE ARTHROPLASTY;  Surgeon: Paralee Cancel, MD;  Location: [facility];  Service: Orthopedics;  Laterality: Left;   Social History:   reports that she has never smoked. She has never used smokeless tobacco. She reports that she does not drink alcohol and does not use drugs.  Family History  Problem Relation Age of Onset  . Hypertension Mother   . Arthritis Mother   . Hypertension Father   . Hypertension Brother   . Hypertension Brother     Medications: Patient's Medications  New Prescriptions   No medications on file  Previous Medications   ACETAMINOPHEN (TYLENOL) 500 MG TABLET    Take 500 mg by mouth as needed.   ACETAMINOPHEN (TYLENOL) 650 MG CR TABLET    Take 650 mg by mouth every 8 (eight) hours as needed for pain.    AMLODIPINE (NORVASC) 5 MG TABLET    Take 1 tablet (5 mg total) by mouth daily.   ASCORBIC ACID (VITAMIN C) 500 MG TABLET    Take 500 mg by mouth daily.   CARVEDILOL (COREG) 3.125 MG TABLET    TAKE 1 TABLET BY MOUTH  TWICE DAILY   CHOLECALCIFEROL (VITAMIN D3) 50 MCG (2000 UT) TABS    Take 2,000 Units by mouth daily.    FLUTICASONE (FLONASE) 50 MCG/ACT NASAL SPRAY    Place 2 sprays into both nostrils daily.   FUROSEMIDE (LASIX) 20 MG TABLET    TAKE 1 TABLET BY MOUTH  DAILY   LEVOTHYROXINE (SYNTHROID, LEVOTHROID) 100 MCG TABLET    Take 1 tablet (100 mcg total) by mouth daily before breakfast.   MAGNESIUM 250 MG TABS    Take 250 mg by mouth daily.   MECLIZINE (ANTIVERT) 12.5  MG TABLET    Take 1 tablet (12.5 mg total) by mouth 3 (three) times daily as needed for dizziness.   NONFORMULARY OR COMPOUNDED ITEM    Apply 120 Tubes topically daily. Diclofenac/Cyclobenzaprine/lamotrigine/lidocaine/prilocaine (10480) 2%/2%/6%/5%/1.25% cream QTY: 120 GM SIG: (NEURO) APPLY 1-2 PUMPS (1-2 GMS) TO AFFECTED AREA (S) OR FOCAL POINTS 3 TO 4 TIMES DAILY. RUB IN FOR 2 MINUTES TO ACHIEVE MAX PENETRATION. [place] HANDS WELL.   OMEGA-3 FATTY ACIDS (FISH OIL PO)    Take 1 capsule by mouth daily.   OMEPRAZOLE (PRILOSEC) 40 MG CAPSULE    TAKE 1 CAPSULE BY MOUTH  ONCE DAILY FOR STOMACH   OXCARBAZEPINE (TRILEPTAL) 300 MG TABLET    Take 300 mg by mouth daily.    POTASSIUM CHLORIDE (KLOR-CON) 10 MEQ TABLET    TAKE 1 TABLET BY MOUTH  DAILY   SENNA-DOCUSATE (SENOKOT S) 8.6-50 MG TABLET    Take 2 tablets by mouth daily.   SERTRALINE (ZOLOFT) 100 MG TABLET    Take 200 mg by mouth daily.    SPIRONOLACTONE (ALDACTONE) 25 MG TABLET    TAKE 1 TABLET BY MOUTH IN  THE MORNING   TIZANIDINE (ZANAFLEX) 2 MG TABLET    Take 1 tablet (2 mg total) by mouth daily as needed for muscle spasms.   VITAMIN E 400 UNITS TABS    Take 400 Units by mouth daily.   Modified Medications   No medications on file  Discontinued Medications   No medications on  file    Physical Exam:  Vitals:   03/11/20 1431  BP: 120/80  Pulse: 73  Temp: (!) 97.4 F (36.3 C)  TempSrc: Temporal  SpO2: 96%  Weight: 121 lb (54.9 kg)  Height: 4\' 10"  (1.473 m)   Body mass index is 25.29 kg/m. Wt Readings from Last 3 Encounters:  03/11/20 121 lb (54.9 kg)  03/10/20 120 lb (54.4 kg)  02/15/20 122 lb (55.3 kg)    Physical Exam Constitutional:      General: She is not in acute distress.    Appearance: She is well-developed. She is not diaphoretic.  HENT:     Head: Normocephalic and atraumatic.     Mouth/Throat:     Pharynx: No oropharyngeal exudate.  Eyes:     Conjunctiva/sclera: Conjunctivae normal.     Pupils: Pupils are equal, round, and reactive to light.  Cardiovascular:     Rate and Rhythm: Normal rate and regular rhythm.     Heart sounds: Normal heart sounds.  Pulmonary:     Effort: Pulmonary effort is normal.     Breath sounds: Normal breath sounds.  Abdominal:     General: Bowel sounds are normal.     Palpations: Abdomen is soft.  Musculoskeletal:        General: No tenderness.     Cervical back: Normal range of motion and neck supple.  Skin:    General: Skin is warm and dry.  Neurological:     Mental Status: She is alert and oriented to person, place, and time.  Psychiatric:        Mood and Affect: Affect is blunt.     Labs reviewed: Basic Metabolic Panel: Recent Labs    04/15/19 1119 06/05/19 0342 07/17/19 1621 07/17/19 1621 11/06/19 1523 12/23/19 0859 01/28/20 0829  NA   < > 140 140   < > 140 139 141  K   < > 3.8 4.5   < > 3.9 4.3 4.5  CL   < >  105 106   < > 106 104 102  CO2   < > 27 27   < > 28 29 33*  GLUCOSE   < > 91 100   < > 126 94 91  BUN   < > 10 31*   < > 30* 26* 31*  CREATININE   < > 0.89 0.83   < > 0.83 0.91 0.95*  CALCIUM   < > 8.6* 10.2   < > 10.4 10.1 10.5*  MG  --  2.1  --   --   --   --   --   PHOS  --  1.4*  --   --   --   --   --   TSH  --   --  1.52  --   --   --   --    < > = values in this  interval not displayed.   Liver Function Tests: Recent Labs    06/01/19 1221 06/01/19 1221 07/17/19 1621 12/23/19 0859 01/28/20 0829  AST 30   < > 20 26 26   ALT 26   < > 17 24 27   ALKPHOS 54  --   --  69  --   BILITOT 0.5   < > 0.2 0.3 0.4  PROT 6.9   < > 6.6 7.6 7.2  ALBUMIN 3.7  --   --  3.7  --    < > = values in this interval not displayed.   No results for input(s): LIPASE, AMYLASE in the last 8760 hours. No results for input(s): AMMONIA in the last 8760 hours. CBC: Recent Labs    12/23/19 0859 01/12/20 1604 01/28/20 0829  WBC 8.4 9.1 8.1  NEUTROABS 5.4 5,915 5,095  HGB 12.7 12.7 13.6  HCT 39.1 38.0 41.2  MCV 84.8 85.4 86.2  PLT 131* 129* 120*   Lipid Panel: No results for input(s): CHOL, HDL, LDLCALC, TRIG, CHOLHDL, LDLDIRECT in the last 8760 hours. TSH: Recent Labs    07/17/19 1621  TSH 1.52   A1C: No results found for: HGBA1C   Assessment/Plan 1. Low back pain radiating to left lower extremity Improved with PT. Unable to tolerate gabapentin due to dizziness. Continues on tylenol PRN. No warning signs or symptoms noted. Continues on PT  2. Urinary retention -improved with stopping Claritin   3. Overactive bladder -prescribed myrbetriq but have not started. Educated may take up to 4 weeks to see effects.  - mirabegron ER (MYRBETRIQ) 25 MG TB24 tablet; Take 1 tablet (25 mg total) by mouth daily.  Dispense: 30 tablet; Refill: 2  4. Chronic idiopathic constipation To add miralax 17 gm daily   5. Dementia without behavioral disturbance, unspecified dementia type ([place]) -stable, living with daughter   33. Malignant neoplasm of lower-outer quadrant of left female breast, unspecified estrogen receptor status ([place]) -s/p mastectomy, followed by oncologist  7. Essential hypertension -well controlled on aldactone, coreg, lasix, amlodipine.   8. Hypothyroidism due to acquired atrophy of thyroid TSH at goal on synthroid 100 mcg daily  9. Age-related  osteoporosis without current pathological fracture Continues on prolia with cal and vit d  Next appt: 4 months, sooner if needed  Cierah Crader K. Warren, [facility] [phone]

## 2020-03-11 NOTE — Patient Instructions (Addendum)
Start miralax 17 gm daily - can titrate to effect if stools too loose decrease to every other day or half dose daily Can always increase to twice daily if does not help enough.      Constipation, Adult Constipation is when a person has fewer bowel movements in a week than normal, has difficulty having a bowel movement, or has stools that are dry, hard, or larger than normal. Constipation may be caused by an underlying condition. It may become worse with age if a person takes certain medicines and does not take in enough fluids. Follow these instructions at home: Eating and drinking   Eat foods that have a lot of fiber, such as fresh fruits and vegetables, whole grains, and beans.  Limit foods that are high in fat, low in fiber, or overly processed, such as french fries, hamburgers, cookies, candies, and soda.  Drink enough fluid to keep your urine clear or pale yellow. General instructions  Exercise regularly or as told by your health care provider.  Go to the restroom when you have the urge to go. Do not hold it in.  Take over-the-counter and prescription medicines only as told by your health care provider. These include any fiber supplements.  Practice pelvic floor retraining exercises, such as deep breathing while relaxing the lower abdomen and pelvic floor relaxation during bowel movements.  Watch your condition for any changes.  Keep all follow-up visits as told by your health care provider. This is important. Contact a health care provider if:  You have pain that gets worse.  You have a fever.  You do not have a bowel movement after 4 days.  You vomit.  You are not hungry.  You lose weight.  You are bleeding from the anus.  You have thin, pencil-like stools. Get help right away if:  You have a fever and your symptoms suddenly get worse.  You leak stool or have blood in your stool.  Your abdomen is bloated.  You have severe pain in your abdomen.  You feel  dizzy or you faint. This information is not intended to replace advice given to you by your health care provider. Make sure you discuss any questions you have with your health care provider. Document Revised: 03/08/2017 Document Reviewed: 09/14/2015 Elsevier Patient Education  2020 [employer].

## 2020-03-18 LAB — HM MAMMOGRAPHY

## 2020-03-21 ENCOUNTER — Encounter: Payer: Self-pay | Admitting: *Deleted

## 2020-04-07 ENCOUNTER — Ambulatory Visit (INDEPENDENT_AMBULATORY_CARE_PROVIDER_SITE_OTHER): Payer: Medicare Other | Admitting: Physician Assistant

## 2020-04-07 ENCOUNTER — Encounter: Payer: Self-pay | Admitting: Physician Assistant

## 2020-04-07 ENCOUNTER — Other Ambulatory Visit: Payer: Self-pay

## 2020-04-07 VITALS — BP 131/76 | HR 71 | Ht <= 58 in | Wt 122.0 lb

## 2020-04-07 DIAGNOSIS — N3281 Overactive bladder: Secondary | ICD-10-CM

## 2020-04-07 LAB — BLADDER SCAN AMB NON-IMAGING

## 2020-04-07 MED ORDER — MIRABEGRON ER 50 MG PO TB24: 50.0000 mg | ORAL_TABLET | Freq: Every day | ORAL | 0 refills | Status: DC

## 2020-04-07 NOTE — Progress Notes (Signed)
04/07/2020 10:14 AM   [name] December 26, 1933 [id number]  CC: Chief Complaint  Patient presents with  . Follow-up  . Over Active Bladder    HPI: [name] is a 86 y.o. female with nighttime urgency, frequency, and urge incontinence who presents today for symptom recheck on Myrbetriq 25 mg daily.  She was seen in clinic most recently by Dr. Richardo Hanks on 03/10/2020 for follow-up of the above.  At that time, she was counseled to wear compression socks during the day, elevate her legs in the evening, minimize fluids before bedtime, void prior to bedtime, and consume her protein shake earlier in the day.  She is accompanied today by her daughter, who contributes to HPI.  Today she reports resolution of urge incontinence and improvement in urgency severity on Myrbetriq 25 mg daily.  She is still mostly bothered by her nocturia, which occurs every 2 hours.  This remains unchanged.  She denies dysuria today.  Overall, she feels she has had some symptomatic improvement over the past month but is not yet at her treatment goal.  She is not wearing compression socks or elevating her legs, however she did decrease fluids after 6 PM, she is voiding before bedtime, and she has been moving her nightly protein shake to lunch.  She completed a sleep study approximately 2 years ago which was negative for sleep apnea.  She does have a history of constipation and notes passing pebble-like stools.  She has been taking Senokot nightly but continues to have constipation.  PVR 44mL.  PMH: Past Medical History:  Diagnosis Date  . Arthritis   . Cancer [facility])    left breast cancer   . Cataracts, bilateral    removed by surgery  . CHF (congestive heart failure) ([facility])    PACEMAKER & DEFIB  . Complication of anesthesia    hypotensive after back surgery in 2006  . Depression   . Dyslipidemia   . Fainted 04/21/06   AT [place]  . GERD (gastroesophageal reflux disease)   . Headache(784.0)   .  Hearing loss    bilateral hearing aids  . HLD (hyperlipidemia)    diet controlled   . Hypertension   . Hypothyroidism   . ICD (implantable cardiac defibrillator) in place    pt has pacer/icd  . ICD (implantable cardiac defibrillator), biventricular, in situ   . LBBB (left bundle branch block)   . Memory loss   . Nonischemic cardiomyopathy ([facility])   . Normal coronary arteries    s/p cardiac cath 2007  . Pacemaker    ICD [employer]  . Syncope   . Systolic CHF ([facility])   . Vertigo   . Wears glasses     Surgical History: Past Surgical History:  Procedure Laterality Date  . BACK SURGERY     lumbar fusion   . BREAST LUMPECTOMY Left 2008  . BREAST SURGERY  2000   LUMP REMOVAL. STAGE 1 CANCER  . CARDIAC CATHETERIZATION    . CATARACT EXTRACTION    . COLONOSCOPY    . EYE SURGERY    . IMPLANTABLE CARDIOVERTER DEFIBRILLATOR GENERATOR CHANGE N/A 12/18/2012   Procedure: IMPLANTABLE CARDIOVERTER DEFIBRILLATOR GENERATOR CHANGE;  Surgeon: Marinus Maw, MD;  Location: [facility] CATH LAB;  Service: Cardiovascular;  Laterality: N/A;  . JOINT REPLACEMENT  06/14/01   right  . LUMBAR FUSION  2006  . MASS EXCISION  11/08/2011   Procedure: EXCISION MASS;  Surgeon: Almond Lint, MD;  [facility];  Service: General;  Laterality: Left;  Excision Left Thigh Mass  . MASTECTOMY W/ SENTINEL NODE BIOPSY Left 06/04/2019   Procedure: LEFT MASTECTOMY WITH SENTINEL LYMPH NODE BIOPSY;  Surgeon: [name], MD;  Location: [place];  Service: General;  Laterality: Left;  Marland Kitchen MASTECTOMY, PARTIAL  2008   GOT PACEMAKER AND DEFIB AT THAT TIME  . PACEMAKER INSERTION  04/23/06  . TOTAL KNEE ARTHROPLASTY  05/17/01   RIGHT KNEE  . TOTAL KNEE ARTHROPLASTY Left 11/29/2014   Procedure: TOTAL LEFT KNEE ARTHROPLASTY;  Surgeon: Paralee Cancel, MD;  [facility];  Service: Orthopedics;  Laterality: Left;    Home Medications:  Allergies as of 04/07/2020      Reactions   Iodine Shortness Of Breath   Iodine contrast, CHF ,  SOB   Shellfish Allergy Shortness Of Breath   Memantine    Malaise, fogginess/ couldn't think   Aspirin Nausea And Vomiting   Codeine Nausea And Vomiting      Medication List       Accurate as of April 07, 2020 10:14 AM. If you have any questions, ask your nurse or doctor.        acetaminophen 650 MG CR tablet Commonly known as: TYLENOL Take 650 mg by mouth every 8 (eight) hours as needed for pain.   acetaminophen 500 MG tablet Commonly known as: TYLENOL Take 500 mg by mouth as needed.   amLODipine 5 MG tablet Commonly known as: NORVASC Take 1 tablet (5 mg total) by mouth daily.   ascorbic acid 500 MG tablet Commonly known as: VITAMIN C Take 500 mg by mouth daily.   carvedilol 3.125 MG tablet Commonly known as: COREG TAKE 1 TABLET BY MOUTH  TWICE DAILY   FISH OIL PO Take 1 capsule by mouth daily.   fluticasone 50 MCG/ACT nasal spray Commonly known as: FLONASE Place 2 sprays into both nostrils daily.   furosemide 20 MG tablet Commonly known as: LASIX TAKE 1 TABLET BY MOUTH  DAILY   levothyroxine 100 MCG tablet Commonly known as: SYNTHROID Take 1 tablet (100 mcg total) by mouth daily before breakfast.   lidocaine-prilocaine cream Commonly known as: EMLA SMARTSIG:2 Topical Every 3 Hours PRN   Magnesium 250 MG Tabs Take 250 mg by mouth daily.   meclizine 12.5 MG tablet Commonly known as: ANTIVERT Take 1 tablet (12.5 mg total) by mouth 3 (three) times daily as needed for dizziness.   mirabegron ER 25 MG Tb24 tablet Commonly known as: Myrbetriq Take 1 tablet (25 mg total) by mouth daily.   NONFORMULARY OR COMPOUNDED ITEM Apply 120 Tubes topically daily. Diclofenac/Cyclobenzaprine/lamotrigine/lidocaine/prilocaine (10480) 2%/2%/6%/5%/1.25% cream QTY: 120 GM SIG: (NEURO) APPLY 1-2 PUMPS (1-2 GMS) TO AFFECTED AREA (S) OR FOCAL POINTS 3 TO 4 TIMES DAILY. RUB IN FOR 2 MINUTES TO ACHIEVE MAX PENETRATION. [place] HANDS WELL.   omeprazole 40 MG capsule Commonly  known as: PRILOSEC TAKE 1 CAPSULE BY MOUTH  ONCE DAILY FOR STOMACH   Oxcarbazepine 300 MG tablet Commonly known as: TRILEPTAL Take 300 mg by mouth daily.   potassium chloride 10 MEQ tablet Commonly known as: KLOR-CON TAKE 1 TABLET BY MOUTH  DAILY   senna-docusate 8.6-50 MG tablet Commonly known as: Senokot S Take 2 tablets by mouth daily.   sertraline 100 MG tablet Commonly known as: ZOLOFT Take 200 mg by mouth daily.   spironolactone 25 MG tablet Commonly known as: ALDACTONE TAKE 1 TABLET BY MOUTH IN  THE MORNING   tiZANidine 2 MG tablet Commonly known  as: ZANAFLEX Take 1 tablet (2 mg total) by mouth daily as needed for muscle spasms.   Vitamin D3 50 MCG (2000 UT) Tabs Take 2,000 Units by mouth daily.   Vitamin E 400 units Tabs Take 400 Units by mouth daily.       Allergies:  Allergies  Allergen Reactions  . Iodine Shortness Of Breath    Iodine contrast, CHF , SOB  . Shellfish Allergy Shortness Of Breath  . Memantine     Malaise, fogginess/ couldn't think  . Aspirin Nausea And Vomiting  . Codeine Nausea And Vomiting    Family History: Family History  Problem Relation Age of Onset  . Hypertension Mother   . Arthritis Mother   . Hypertension Father   . Hypertension Brother   . Hypertension Brother     Social History:   reports that she has never smoked. She has never used smokeless tobacco. She reports that she does not drink alcohol and does not use drugs.  Physical Exam: BP 131/76 (BP Location: Left Arm, Patient Position: Sitting, Cuff Size: Normal)   Pulse 71   Ht 4\' 10"  (1.473 m)   Wt 122 lb (55.3 kg)   LMP  (LMP Unknown)   BMI 25.50 kg/m   Constitutional:  Alert and oriented, no acute distress, nontoxic appearing HEENT: [place], AT Cardiovascular: No clubbing, cyanosis, or edema Respiratory: Normal respiratory effort, no increased work of breathing Skin: No rashes, bruises or suspicious lesions Neurologic: Grossly intact, no focal deficits,  moving all 4 extremities Psychiatric: Normal mood and affect  Laboratory Data: Results for orders placed or performed in visit on 04/07/20  Bladder Scan (Post Void Residual) in office  Result Value Ref Range   Scan Result 24mL    Assessment & Plan:   1. Overactive bladder Symptomatic improvement on Myrbetriq 25 mg daily with normal PVR, however her nocturia remains unchanged.  We discussed various treatment options at this point including continued Myrbetriq 25 mg daily, increase to Myrbetriq 50 mg daily, PTNS, intravesical Botox, and InterStim.  Ultimately, patient wishes to increase her Myrbetriq dose to 50 mg daily with plans for symptom recheck in 1 month.  I am in agreement with this plan.  Additionally, given her history of constipation despite Senokot, there may be an element of bowel bladder dysfunction playing a role with her symptoms.  I counseled her to start MiraLAX with the goal of reducing formed, easy to pass bowel movements.  She expressed understanding. - Bladder Scan (Post Void Residual) in office - mirabegron ER (MYRBETRIQ) 50 MG TB24 tablet; Take 1 tablet (50 mg total) by mouth daily.  Dispense: 28 tablet; Refill: 0   Return in about 4 weeks (around 05/05/2020) for Symptom recheck with PVR.  Debroah Loop, PA-C  [facility] [address], [place], [place] [address] [phone]

## 2020-04-18 ENCOUNTER — Telehealth: Payer: Self-pay

## 2020-04-18 ENCOUNTER — Other Ambulatory Visit: Payer: Self-pay

## 2020-04-18 ENCOUNTER — Encounter: Payer: Self-pay | Admitting: Nurse Practitioner

## 2020-04-18 ENCOUNTER — Ambulatory Visit (INDEPENDENT_AMBULATORY_CARE_PROVIDER_SITE_OTHER): Payer: Medicare Other | Admitting: Nurse Practitioner

## 2020-04-18 DIAGNOSIS — Z0000 Encounter for general adult medical examination without abnormal findings: Secondary | ICD-10-CM

## 2020-04-18 NOTE — Progress Notes (Signed)
   This service is provided via telemedicine  No vital signs collected/recorded due to the encounter was a telemedicine visit.   Location of patient (ex: home, work):  Home  Patient consents to a telephone visit: Yes, see telephone visit dated 04/18/20  Location of the provider (ex: office, home):  [facility], Office   Name of any referring provider:  N/A  Names of all persons participating in the telemedicine service and their role in the encounter:  S.Chrae B/CMA, Sherrie Mustache, NP, daughter [name]) and Patient   Time spent on call:  9 min with medical assistant

## 2020-04-18 NOTE — Telephone Encounter (Signed)
[name], [name] are scheduled for a virtual visit with your provider today.    Just as we do with appointments in the office, we must obtain your consent to participate.  Your consent will be active for this visit and any virtual visit you may have with one of our providers in the next 365 days.    If you have a MyChart account, I can also send a copy of this consent to you electronically.  All virtual visits are billed to your insurance company just like a traditional visit in the office.  As this is a virtual visit, video technology does not allow for your provider to perform a traditional examination.  This may limit your provider's ability to fully assess your condition.  If your provider identifies any concerns that need to be evaluated in person or the need to arrange testing such as labs, EKG, etc, we will make arrangements to do so.    Although advances in technology are sophisticated, we cannot ensure that it will always work on either your end or our end.  If the connection with a video visit is poor, we may have to switch to a telephone visit.  With either a video or telephone visit, we are not always able to ensure that we have a secure connection.   I need to obtain your verbal consent now.   Are you willing to proceed with your visit today?   [name] has provided verbal consent on 04/18/2020 for a virtual visit (video or telephone).   [name] [place], [place] 04/18/2020  10:13 AM

## 2020-04-18 NOTE — Patient Instructions (Signed)
[name] , Thank you for taking time to come for your Medicare Wellness Visit. I appreciate your ongoing commitment to your health goals. Please review the following plan we discussed and let me know if I can assist you in the future.   Screening recommendations/referrals: Colonoscopy aged out Mammogram up todate Bone Density up to date Recommended yearly ophthalmology/optometry visit for glaucoma screening and checkup Recommended yearly dental visit for hygiene and checkup  Vaccinations: Influenza vaccine up to date Pneumococcal vaccine up to date Tdap vaccine up to date Shingles vaccine RECOMMENDED to get at local pharmacy.     Advanced directives: on file.   Conditions/risks identified: advanced age, memory loss  Next appointment: year   Preventive Care 85 Years and Older, Female Preventive care refers to lifestyle choices and visits with your health care provider that can promote health and wellness. What does preventive care include?  A yearly physical exam. This is also called an annual well check.  Dental exams once or twice a year.  Routine eye exams. Ask your health care provider how often you should have your eyes checked.  Personal lifestyle choices, including:  Daily care of your teeth and gums.  Regular physical activity.  Eating a healthy diet.  Avoiding tobacco and drug use.  Limiting alcohol use.  Practicing safe sex.  Taking low-dose aspirin every day.  Taking vitamin and mineral supplements as recommended by your health care provider. What happens during an annual well check? The services and screenings done by your health care provider during your annual well check will depend on your age, overall health, lifestyle risk factors, and family history of disease. Counseling  Your health care provider may ask you questions about your:  Alcohol use.  Tobacco use.  Drug use.  Emotional well-being.  Home and relationship well-being.  Sexual  activity.  Eating habits.  History of falls.  Memory and ability to understand (cognition).  Work and work Statistician.  Reproductive health. Screening  You may have the following tests or measurements:  Height, weight, and BMI.  Blood pressure.  Lipid and cholesterol levels. These may be checked every 5 years, or more frequently if you are over 21 years old.  Skin check.  Lung cancer screening. You may have this screening every year starting at age 40 if you have a 30-pack-year history of smoking and currently smoke or have quit within the past 15 years.  Fecal occult blood test (FOBT) of the stool. You may have this test every year starting at age 29.  Flexible sigmoidoscopy or colonoscopy. You may have a sigmoidoscopy every 5 years or a colonoscopy every 10 years starting at age 66.  Hepatitis C blood test.  Hepatitis B blood test.  Sexually transmitted disease (STD) testing.  Diabetes screening. This is done by checking your blood sugar (glucose) after you have not eaten for a while (fasting). You may have this done every 1-3 years.  Bone density scan. This is done to screen for osteoporosis. You may have this done starting at age 64.  Mammogram. This may be done every 1-2 years. Talk to your health care provider about how often you should have regular mammograms. Talk with your health care provider about your test results, treatment options, and if necessary, the need for more tests. Vaccines  Your health care provider may recommend certain vaccines, such as:  Influenza vaccine. This is recommended every year.  Tetanus, diphtheria, and acellular pertussis (Tdap, Td) vaccine. You may need a Td  booster every 10 years.  Zoster vaccine. You may need this after age 19.  Pneumococcal 13-valent conjugate (PCV13) vaccine. One dose is recommended after age 38.  Pneumococcal polysaccharide (PPSV23) vaccine. One dose is recommended after age 55. Talk to your health care  provider about which screenings and vaccines you need and how often you need them. This information is not intended to replace advice given to you by your health care provider. Make sure you discuss any questions you have with your health care provider. Document Released: 04/22/2015 Document Revised: 12/14/2015 Document Reviewed: 01/25/2015 Elsevier Interactive Patient Education  2017 [place] Prevention in the Home Falls can cause injuries. They can happen to people of all ages. There are many things you can do to make your home safe and to help prevent falls. What can I do on the outside of my home?  Regularly fix the edges of walkways and driveways and fix any cracks.  Remove anything that might make you trip as you walk through a door, such as a raised step or threshold.  Trim any bushes or trees on the path to your home.  Use bright outdoor lighting.  Clear any walking paths of anything that might make someone trip, such as rocks or tools.  Regularly check to see if handrails are loose or broken. Make sure that both sides of any steps have handrails.  Any raised decks and porches should have guardrails on the edges.  Have any leaves, snow, or ice cleared regularly.  Use sand or salt on walking paths during winter.  Clean up any spills in your garage right away. This includes oil or grease spills. What can I do in the bathroom?  Use night lights.  Install grab bars by the toilet and in the tub and shower. Do not use towel bars as grab bars.  Use non-skid mats or decals in the tub or shower.  If you need to sit down in the shower, use a plastic, non-slip stool.  Keep the floor dry. Clean up any water that spills on the floor as soon as it happens.  Remove soap buildup in the tub or shower regularly.  Attach bath mats securely with double-sided non-slip rug tape.  Do not have throw rugs and other things on the floor that can make you trip. What can I do in  the bedroom?  Use night lights.  Make sure that you have a light by your bed that is easy to reach.  Do not use any sheets or blankets that are too big for your bed. They should not hang down onto the floor.  Have a firm chair that has side arms. You can use this for support while you get dressed.  Do not have throw rugs and other things on the floor that can make you trip. What can I do in the kitchen?  Clean up any spills right away.  Avoid walking on wet floors.  Keep items that you use a lot in easy-to-reach places.  If you need to reach something above you, use a strong step stool that has a grab bar.  Keep electrical cords out of the way.  Do not use floor polish or wax that makes floors slippery. If you must use wax, use non-skid floor wax.  Do not have throw rugs and other things on the floor that can make you trip. What can I do with my stairs?  Do not leave any items on the stairs.  Make sure that there are handrails on both sides of the stairs and use them. Fix handrails that are broken or loose. Make sure that handrails are as long as the stairways.  Check any carpeting to make sure that it is firmly attached to the stairs. Fix any carpet that is loose or worn.  Avoid having throw rugs at the top or bottom of the stairs. If you do have throw rugs, attach them to the floor with carpet tape.  Make sure that you have a light switch at the top of the stairs and the bottom of the stairs. If you do not have them, ask someone to add them for you. What else can I do to help prevent falls?  Wear shoes that:  Do not have high heels.  Have rubber bottoms.  Are comfortable and fit you well.  Are closed at the toe. Do not wear sandals.  If you use a stepladder:  Make sure that it is fully opened. Do not climb a closed stepladder.  Make sure that both sides of the stepladder are locked into place.  Ask someone to hold it for you, if possible.  Clearly mark and  make sure that you can see:  Any grab bars or handrails.  First and last steps.  Where the edge of each step is.  Use tools that help you move around (mobility aids) if they are needed. These include:  Canes.  Walkers.  Scooters.  Crutches.  Turn on the lights when you go into a dark area. Replace any light bulbs as soon as they burn out.  Set up your furniture so you have a clear path. Avoid moving your furniture around.  If any of your floors are uneven, fix them.  If there are any pets around you, be aware of where they are.  Review your medicines with your doctor. Some medicines can make you feel dizzy. This can increase your chance of falling. Ask your doctor what other things that you can do to help prevent falls. This information is not intended to replace advice given to you by your health care provider. Make sure you discuss any questions you have with your health care provider. Document Released: 01/20/2009 Document Revised: 09/01/2015 Document Reviewed: 04/30/2014 Elsevier Interactive Patient Education  2017 [place]

## 2020-04-18 NOTE — Progress Notes (Signed)
Subjective:   [name] is a 87 y.o. female who presents for Medicare Annual (Subsequent) preventive examination.  Review of Systems     Cardiac Risk Factors include: advanced age (>50men, >73 women);hypertension     Objective:    Today's Vitals   04/18/20 1017  PainSc: 4    There is no height or weight on file to calculate BMI.  Advanced Directives 04/18/2020 03/11/2020 02/15/2020 02/04/2020 11/06/2019 07/17/2019 07/01/2019  Does Patient Have a Medical Advance Directive? Yes Yes Yes Yes Yes Yes Yes  Type of Advance Directive Living will;Out of facility DNR (pink MOST or yellow form) Living will;Out of facility DNR (pink MOST or yellow form) Out of facility DNR (pink MOST or yellow form) Living will;Out of facility DNR (pink MOST or yellow form) Living will;Out of facility DNR (pink MOST or yellow form) [place];Out of facility DNR (pink MOST or yellow form) Living will;Out of facility DNR (pink MOST or yellow form)  Does patient want to make changes to medical advance directive? - No - Patient declined No - Patient declined No - Patient declined No - Patient declined No - Patient declined No - Patient declined  Copy of [place] in Chart? - - - - - Yes - validated most recent copy scanned in chart (See row information) Yes - validated most recent copy scanned in chart (See row information)  Pre-existing out of facility DNR order (yellow form or pink MOST form) - Yellow form placed in chart (order not valid for inpatient use) Pink MOST/Yellow Form most recent copy in chart - Physician notified to receive inpatient order Pink MOST form placed in chart (order not valid for inpatient use) Yellow form placed in chart (order not valid for inpatient use) Yellow form placed in chart (order not valid for inpatient use) -    Current Medications (verified) Outpatient Encounter Medications as of 04/18/2020  Medication Sig  . acetaminophen (TYLENOL) 500  MG tablet Take 500 mg by mouth as needed.  Marland Kitchen acetaminophen (TYLENOL) 650 MG CR tablet Take 650 mg by mouth every 8 (eight) hours as needed for pain.  Marland Kitchen amLODipine (NORVASC) 5 MG tablet Take 1 tablet (5 mg total) by mouth daily.  Marland Kitchen ascorbic acid (VITAMIN C) 500 MG tablet Take 500 mg by mouth daily.  . carvedilol (COREG) 3.125 MG tablet TAKE 1 TABLET BY MOUTH  TWICE DAILY  . Cholecalciferol (VITAMIN D3) 50 MCG (2000 UT) TABS Take 2,000 Units by mouth daily.   . fluticasone (FLONASE) 50 MCG/ACT nasal spray Place 2 sprays into both nostrils daily.  . furosemide (LASIX) 20 MG tablet TAKE 1 TABLET BY MOUTH  DAILY  . levothyroxine (SYNTHROID, LEVOTHROID) 100 MCG tablet Take 1 tablet (100 mcg total) by mouth daily before breakfast.  . Lidocaine 4 % PTCH Apply 1 patch topically every 12 (twelve) hours. Apply to back  . Magnesium 250 MG TABS Take 250 mg by mouth daily.  . meclizine (ANTIVERT) 12.5 MG tablet Take 1 tablet (12.5 mg total) by mouth 3 (three) times daily as needed for dizziness.  . mirabegron ER (MYRBETRIQ) 50 MG TB24 tablet Take 1 tablet (50 mg total) by mouth daily.  . NONFORMULARY OR COMPOUNDED ITEM Apply 120 Tubes topically daily. Diclofenac/Cyclobenzaprine/lamotrigine/lidocaine/prilocaine (10480) 2%/2%/6%/5%/1.25% cream QTY: 120 GM SIG: (NEURO) APPLY 1-2 PUMPS (1-2 GMS) TO AFFECTED AREA (S) OR FOCAL POINTS 3 TO 4 TIMES DAILY. RUB IN FOR 2 MINUTES TO ACHIEVE MAX PENETRATION. [place] HANDS WELL.  Marland Kitchen  Omega-3 Fatty Acids (FISH OIL PO) Take 1 capsule by mouth daily.  Marland Kitchen omeprazole (PRILOSEC) 40 MG capsule TAKE 1 CAPSULE BY MOUTH  ONCE DAILY FOR STOMACH  . Oxcarbazepine (TRILEPTAL) 300 MG tablet Take 300 mg by mouth daily.   . polyethylene glycol powder (MIRALAX) 17 GM/SCOOP powder Take 1 Container by mouth as needed.  . potassium chloride (KLOR-CON) 10 MEQ tablet TAKE 1 TABLET BY MOUTH  DAILY  . senna-docusate (SENOKOT S) 8.6-50 MG tablet Take 2 tablets by mouth daily.  . sertraline (ZOLOFT) 100  MG tablet Take 200 mg by mouth daily.   Marland Kitchen spironolactone (ALDACTONE) 25 MG tablet TAKE 1 TABLET BY MOUTH IN  THE MORNING  . tiZANidine (ZANAFLEX) 2 MG tablet Take 1 tablet (2 mg total) by mouth daily as needed for muscle spasms.  . Vitamin E 400 units TABS Take 400 Units by mouth daily.   . [DISCONTINUED] lidocaine-prilocaine (EMLA) cream SMARTSIG:2 Topical Every 3 Hours PRN   No facility-administered encounter medications on file as of 04/18/2020.    Allergies (verified) Iodine, Shellfish allergy, Memantine, Aspirin, and Codeine   History: Past Medical History:  Diagnosis Date  . Arthritis   . Cancer [facility])    left breast cancer   . Cataracts, bilateral    removed by surgery  . CHF (congestive heart failure) ([place])    PACEMAKER & DEFIB  . Complication of anesthesia    hypotensive after back surgery in 2006  . Depression   . Dyslipidemia   . Fainted 04/21/06   AT [place]  . GERD (gastroesophageal reflux disease)   . Headache(784.0)   . Hearing loss    bilateral hearing aids  . HLD (hyperlipidemia)    diet controlled   . Hypertension   . Hypothyroidism   . ICD (implantable cardiac defibrillator) in place    pt has pacer/icd  . ICD (implantable cardiac defibrillator), biventricular, in situ   . LBBB (left bundle branch block)   . Memory loss   . Nonischemic cardiomyopathy ([place])   . Normal coronary arteries    s/p cardiac cath 2007  . Pacemaker    ICD [employer]  . Syncope   . Systolic CHF ([place])   . Vertigo   . Wears glasses    Past Surgical History:  Procedure Laterality Date  . BACK SURGERY     lumbar fusion   . BREAST LUMPECTOMY Left 2008  . BREAST SURGERY  2000   LUMP REMOVAL. STAGE 1 CANCER  . CARDIAC CATHETERIZATION    . CATARACT EXTRACTION    . COLONOSCOPY    . EYE SURGERY    . IMPLANTABLE CARDIOVERTER DEFIBRILLATOR GENERATOR CHANGE N/A 12/18/2012   Procedure: IMPLANTABLE CARDIOVERTER DEFIBRILLATOR GENERATOR CHANGE;  Surgeon: [name], MD;   Location: [facility] CATH LAB;  Service: Cardiovascular;  Laterality: N/A;  . JOINT REPLACEMENT  06/14/01   right  . LUMBAR FUSION  2006  . MASS EXCISION  11/08/2011   Procedure: EXCISION MASS;  Surgeon: [name], MD;  Location: [facility] ORS;  Service: General;  Laterality: Left;  Excision Left Thigh Mass  . MASTECTOMY W/ SENTINEL NODE BIOPSY Left 06/04/2019   Procedure: LEFT MASTECTOMY WITH SENTINEL LYMPH NODE BIOPSY;  Surgeon: [name], MD;  Location: [place];  Service: General;  Laterality: Left;  Marland Kitchen MASTECTOMY, PARTIAL  2008   GOT PACEMAKER AND DEFIB AT THAT TIME  . PACEMAKER INSERTION  04/23/06  . TOTAL KNEE ARTHROPLASTY  05/17/01   RIGHT KNEE  . TOTAL  KNEE ARTHROPLASTY Left 11/29/2014   Procedure: TOTAL LEFT KNEE ARTHROPLASTY;  Surgeon: Paralee Cancel, MD;  Location: [facility] OR[facility];  Service: Orthopedics;  Laterality: Left;   Family History  Problem Relation Age of Onset  . Hypertension Mother   . Arthritis Mother   . Hypertension Father   . Hypertension Brother   . Hypertension Brother    Social History   Socioeconomic History  . Marital status: Married    Spouse name: Not on file  . Number of children: 1  . Years of education: Masters  . Highest education level: Not on file  Occupational History  . Occupation: Retired  Tobacco Use  . Smoking status: Never Smoker  . Smokeless tobacco: Never Used  Vaping Use  . Vaping Use: Never used  Substance and Sexual Activity  . Alcohol use: No  . Drug use: No  . Sexual activity: Not Currently    Birth control/protection: Post-menopausal  Other Topics Concern  . Not on file  Social History Narrative   Lives at home with husband.   Right-handed.      As of 07/28/2014   Diet: No special diet   Caffeine: yes, Chocolate, tea and sodas    Married: YES, 1970   House: Yes, 2 stories, 2-3 persons live in home   Pets: No   Current/Past profession: Engineer, mining, Designer, jewellery    Exercise: Yes 2-3 x weekly   Living Will: Yes   DNR: No    POA/HPOA: No      Social Determinants of Radio broadcast assistant Strain: Not on file  Food Insecurity: Not on file  Transportation Needs: Not on file  Physical Activity: Not on file  Stress: Not on file  Social Connections: Not on file    Tobacco Counseling Counseling given: Not Answered   Clinical Intake:  Pre-visit preparation completed: Yes  Pain : 0-10 Pain Score: 4  Pain Type: Chronic pain Pain Location: Back Pain Orientation: Mid,Lower Pain Descriptors / Indicators: Cramping,Aching Pain Onset: More than a month ago Pain Frequency: Constant Pain Relieving Factors: tylenol  Pain Relieving Factors: tylenol  BMI - recorded: 25 Nutritional Status: BMI 25 -29 Overweight Nutritional Risks: None Diabetes: No  How often do you need to have someone help you when you read instructions, pamphlets, or other written materials from your doctor or pharmacy?: 1 - Never  Diabetic?no         Activities of Daily Living In your present state of health, do you have any difficulty performing the following activities: 04/18/2020 06/01/2019  Hearing? Y Y  Comment hearing aides wears hearing aids  Vision? Y N  Difficulty concentrating or making decisions? Tempie Donning  Walking or climbing stairs? Tempie Donning  Comment trouble walking stairs pt states "can not walk too far" - ambulates with no aids  Dressing or bathing? N N  Doing errands, shopping? Y N  Comment daughter does the errands -  Conservation officer, nature and eating ? N -  Using the Toilet? N -  In the past six months, have you accidently leaked urine? N -  Do you have problems with loss of bowel control? N -  Managing your Medications? N -  Managing your Finances? Y -  Comment daughter manages -  Housekeeping or managing your Housekeeping? N -  Some recent data might be hidden    Patient Care Team: [name], NP as PCP - General (Geriatric Medicine) [name], MD as Consulting Physician (General Surgery) [place],  Rodman Key, MD as Consulting Physician (Orthopedic Surgery) Marica Otter, Foster (Optometry) Marcial Pacas, MD as Consulting Physician (Neurology) [name], MD as Consulting Physician (Cardiology) Ricard Dillon, MD (Psychiatry)  Indicate any recent Medical Services you may have received from other than [facility] providers in the past year (date may be approximate).     Assessment:   This is a routine wellness examination for [name].  Hearing/Vision screen  Hearing Screening   125Hz  250Hz  500Hz  1000Hz  2000Hz  3000Hz  4000Hz  6000Hz  8000Hz   Right ear:           Left ear:           Comments: Wears hearing aids in both ears, patient states they are helping   Vision Screening Comments: Last eye exam less than 12 months ago (September 2021)   Dietary issues and exercise activities discussed: Current Exercise Habits: Home exercise routine, Type of exercise: walking;calisthenics;stretching, Time (Minutes): 15, Frequency (Times/Week): 7, Weekly Exercise (Minutes/Week): 105  Goals    . DIET - INCREASE WATER INTAKE     Pt will try to increase water intake to 3-4 cups a day    . Patient Stated     To maintain weight    . Patient Stated     Control back pain better without medication.       Depression Screen PHQ 2/9 Scores 02/15/2020 02/04/2020 11/06/2019 04/15/2019 11/24/2018 11/14/2018 07/14/2018  PHQ - 2 Score - 0 4 0 0 0 -  PHQ- 9 Score - - 11 - - - -  Exception Documentation Other- indicate reason in comment box - - (No Data) - - Medical reason    Fall Risk Fall Risk  02/15/2020 02/04/2020 01/28/2020 11/06/2019 08/24/2019  Falls in the past year? 0 0 1 1 1   Number falls in past yr: 0 0 0 0 1  Injury with Fall? 0 0 0 0 0  Comment - - - - -  Risk for fall due to : - - - - History of fall(s)    FALL RISK PREVENTION PERTAINING TO THE HOME:  Any stairs in or around the home? Yes  If so, are there any without handrails? No  Home free of loose throw rugs in walkways, pet beds, electrical  cords, etc? Yes  Adequate lighting in your home to reduce risk of falls? Yes   ASSISTIVE DEVICES UTILIZED TO PREVENT FALLS:  Life alert? No  Use of a cane, walker or w/c? No  Grab bars in the bathroom? Yes  Shower chair or bench in shower? No  Elevated toilet seat or a handicapped toilet? No   TIMED UP AND GO:  Was the test performed? No .    Cognitive Function: MMSE - Mini Mental State Exam 01/28/2020 07/23/2019 01/21/2019 01/29/2018 07/24/2017  Not completed: - - (No Data) (No Data) -  Orientation to time 5 4 5 5 4   Orientation to Place 5 5 4 5 5   Registration 3 3 3 3 3   Attention/ Calculation 0 4 1 5 2   Recall 3 2 3 1 2   Language- name 2 objects 2 2 1 2 2   Language- repeat 0 1 1 1 1   Language- follow 3 step command 3 3 2 3 3   Language- follow 3 step command-comments - - she didnt fold the paper - -  Language- read & follow direction 1 1 1 1 1   Write a sentence 1 1 1 1 1   Copy design 1 1 1 1 1   Copy design-comments -  13 animals - - -  Total score 24 27 23 28 25         Immunizations Immunization History  Administered Date(s) Administered  . Fluad Quad(high Dose 65+) 01/28/2020  . Influenza, High Dose Seasonal PF 01/16/2018, 12/08/2018  . Influenza-Unspecified 12/08/2013, 01/10/2015, 12/09/2015, 01/12/2017  . PFIZER SARS-COV-2 Vaccination 05/23/2019, 06/15/2019, 12/23/2019  . Pneumococcal Conjugate-13 04/09/2012  . Pneumococcal Polysaccharide-23 02/28/2016  . Tdap 01/07/2013  . Zoster 08/05/2014    TDAP status: Up to date  Flu Vaccine status: Up to date  Pneumococcal vaccine status: Up to date  Covid-19 vaccine status: Completed vaccines  Qualifies for Shingles Vaccine? Yes   Zostavax completed Yes   Shingrix Completed?: No.    Education has been provided regarding the importance of this vaccine. Patient has been advised to call insurance company to determine out of pocket expense if they have not yet received this vaccine. Advised may also receive vaccine at  local pharmacy or Health Dept. Verbalized acceptance and understanding.  Screening Tests Health Maintenance  Topic Date Due  . COVID-19 Vaccine (4 - Booster for Pfizer series) 06/21/2020  . MAMMOGRAM  03/18/2021  . TETANUS/TDAP  01/08/2023  . INFLUENZA VACCINE  Completed  . DEXA SCAN  Completed  . PNA vac Low Risk Adult  Completed    Health Maintenance  There are no preventive care reminders to display for this patient.  Colorectal cancer screening: No longer required.   Mammogram status: Completed 03/18/2020. Repeat every year  Bone Density status: Completed jan 2021. Results reflect: Bone density results: OSTEOPENIA. Repeat every 2 years.  Lung Cancer Screening: (Low Dose CT Chest recommended if Age 34-80 years, 30 pack-year currently smoking OR have quit w/in 15years.) does not qualify.   Lung Cancer Screening Referral: na  Additional Screening:  Hepatitis C Screening: does not qualify; Completed na  Vision Screening: Recommended annual ophthalmology exams for early detection of glaucoma and other disorders of the eye. Is the patient up to date with their annual eye exam?  Yes  Who is the provider or what is the name of the office in which the patient attends annual eye exams? Dr Sabra Heck If pt is not established with a provider, would they like to be referred to a provider to establish care? No .   Dental Screening: Recommended annual dental exams for proper oral hygiene  Community Resource Referral / Chronic Care Management: CRR required this visit?  No   CCM required this visit?  No      Plan:     I have personally reviewed and noted the following in the patient's chart:   . Medical and social history . Use of alcohol, tobacco or illicit drugs  . Current medications and supplements . Functional ability and status . Nutritional status . Physical activity . Advanced directives . List of other physicians . Hospitalizations, surgeries, and ER visits in  previous 12 months . Vitals . Screenings to include cognitive, depression, and falls . Referrals and appointments  In addition, I have reviewed and discussed with patient certain preventive protocols, quality metrics, and best practice recommendations. A written personalized care plan for preventive services as well as general preventive health recommendations were provided to patient.     [name], NP   04/18/2020    Virtual Visit via Telephone Note  I connected with@ on 04/18/20 at 10:00 AM EST by telephone and verified that I am speaking with the correct person using two identifiers.  Location: Patient: home Provider:  clinic   I discussed the limitations, risks, security and privacy concerns of performing an evaluation and management service by telephone and the availability of in person appointments. I also discussed with the patient that there may be a patient responsible charge related to this service. The patient expressed understanding and agreed to proceed.   I discussed the assessment and treatment plan with the patient. The patient was provided an opportunity to ask questions and all were answered. The patient agreed with the plan and demonstrated an understanding of the instructions.   The patient was advised to call back or seek an in-person evaluation if the symptoms worsen or if the condition fails to improve as anticipated.  I provided 18 minutes of non-face-to-face time during this encounter.  Carlos American. Harle Battiest Avs printed and mailed

## 2020-05-02 ENCOUNTER — Telehealth: Payer: Self-pay

## 2020-05-02 NOTE — Telephone Encounter (Signed)
She would need to come in for evaluation.

## 2020-05-02 NOTE — Telephone Encounter (Signed)
Called patient back and told her that you recommended she some in she stated she wanted to just watch it and see what happens.

## 2020-05-02 NOTE — Telephone Encounter (Signed)
Okay thank you

## 2020-05-02 NOTE — Telephone Encounter (Addendum)
Patient called and stated that she was doing therapy for her back and she suddenly had a sharp pain from back to stomach to top of hip. She wants to know is there anything for her to do? Please advise

## 2020-05-05 ENCOUNTER — Ambulatory Visit (INDEPENDENT_AMBULATORY_CARE_PROVIDER_SITE_OTHER): Payer: Medicare Other

## 2020-05-05 ENCOUNTER — Ambulatory Visit (INDEPENDENT_AMBULATORY_CARE_PROVIDER_SITE_OTHER): Payer: Medicare Other | Admitting: Physician Assistant

## 2020-05-05 ENCOUNTER — Other Ambulatory Visit: Payer: Self-pay

## 2020-05-05 ENCOUNTER — Encounter: Payer: Self-pay | Admitting: Physician Assistant

## 2020-05-05 VITALS — BP 130/74 | HR 75 | Ht 59.0 in | Wt 120.0 lb

## 2020-05-05 DIAGNOSIS — R109 Unspecified abdominal pain: Secondary | ICD-10-CM

## 2020-05-05 DIAGNOSIS — I428 Other cardiomyopathies: Secondary | ICD-10-CM

## 2020-05-05 DIAGNOSIS — N3281 Overactive bladder: Secondary | ICD-10-CM

## 2020-05-05 LAB — CUP PACEART REMOTE DEVICE CHECK
Battery Remaining Longevity: 36 mo
Battery Remaining Percentage: 58 %
Brady Statistic RA Percent Paced: 0 %
Brady Statistic RV Percent Paced: 100 %
Date Time Interrogation Session: 20220127071300
HighPow Impedance: 50 Ohm
Implantable Lead Implant Date: 20080116
Implantable Lead Implant Date: 20080116
Implantable Lead Implant Date: 20080116
Implantable Lead Location: 753859
Implantable Lead Location: 753860
Implantable Lead Location: 753860
Implantable Lead Model: 157
Implantable Lead Model: 4469
Implantable Lead Model: 4555
Implantable Lead Serial Number: 136532
Implantable Lead Serial Number: 161542
Implantable Lead Serial Number: 473495
Implantable Pulse Generator Implant Date: 20140911
Lead Channel Impedance Value: 404 Ohm
Lead Channel Impedance Value: 554 Ohm
Lead Channel Impedance Value: 957 Ohm
Lead Channel Pacing Threshold Amplitude: 0.6 V
Lead Channel Pacing Threshold Amplitude: 0.7 V
Lead Channel Pacing Threshold Amplitude: 0.8 V
Lead Channel Pacing Threshold Pulse Width: 0.4 ms
Lead Channel Pacing Threshold Pulse Width: 0.4 ms
Lead Channel Pacing Threshold Pulse Width: 0.8 ms
Lead Channel Setting Pacing Amplitude: 2 V
Lead Channel Setting Pacing Amplitude: 2 V
Lead Channel Setting Pacing Amplitude: 2.4 V
Lead Channel Setting Pacing Pulse Width: 0.4 ms
Lead Channel Setting Pacing Pulse Width: 0.8 ms
Lead Channel Setting Sensing Sensitivity: 0.6 mV
Lead Channel Setting Sensing Sensitivity: 1 mV
Pulse Gen Serial Number: 111765

## 2020-05-05 LAB — BLADDER SCAN AMB NON-IMAGING

## 2020-05-05 MED ORDER — MIRABEGRON ER 50 MG PO TB24: 50.0000 mg | ORAL_TABLET | Freq: Every day | ORAL | 11 refills | Status: DC

## 2020-05-05 NOTE — Patient Instructions (Signed)
Increase Miralax to one capful daily to reach your goal of having smooth, formed bowel movements that are easy to pass. The imaging department will call you to schedule an ultrasound of your kidneys and I will call you when I get your results.

## 2020-05-05 NOTE — Progress Notes (Signed)
05/05/2020 10:44 AM   [place] Aug 19, 1933 [id number]  CC: Chief Complaint  Patient presents with  . Over Active Bladder   HPI: [name] is a 87 y.o. female with nighttime urgency, frequency, and urge incontinence who presents today for symptom recheck on Myrbetriq 50 mg daily.  I previously started her on Myrbetriq 25 mg daily, at which dose she reported resolution of her urge incontinence and improvement in the severity of her urgency.  She continued to report nocturia every 2 hours, x4-5 and was bothered by this so we elected to increase her dose of Myrbetriq.  Today she reports decreased nocturia, now 3 times nightly.  She states her urinary urgency continues to improve and she remains dry.  Additionally, she has been taking MiraLAX for management of her chronic constipation and notes 1 formed, smooth bowel movement weekly and continued small pellet-like stools the other days of the week. PVR 18 mL.   Additionally, she reports sudden onset of left flank pain radiating to the LLQ 1 week ago while in physical therapy.  The pain is not exacerbated or palliated with positional changes and she denies dysuria, fever, chills, nausea, vomiting, and gross hematuria today.  She underwent CTAP without contrast on 11/27/2018 with no evidence of urolithiasis.  She underwent a lumbar x-ray on 01/29/2020 with no radiopaque renal or ureteral stones.  She denies a history of nephrolithiasis.  PMH: Past Medical History:  Diagnosis Date  . Arthritis   . Cancer [facility])    left breast cancer   . Cataracts, bilateral    removed by surgery  . CHF (congestive heart failure) ([place])    PACEMAKER & DEFIB  . Complication of anesthesia    hypotensive after back surgery in 2006  . Depression   . Dyslipidemia   . Fainted 04/21/06   AT [place]  . GERD (gastroesophageal reflux disease)   . Headache(784.0)   . Hearing loss    bilateral hearing aids  . HLD (hyperlipidemia)    diet  controlled   . Hypertension   . Hypothyroidism   . ICD (implantable cardiac defibrillator) in place    pt has pacer/icd  . ICD (implantable cardiac defibrillator), biventricular, in situ   . LBBB (left bundle branch block)   . Memory loss   . Nonischemic cardiomyopathy ([place])   . Normal coronary arteries    s/p cardiac cath 2007  . Pacemaker    ICD [employer]  . Syncope   . Systolic CHF ([place])   . Vertigo   . Wears glasses     Surgical History: Past Surgical History:  Procedure Laterality Date  . BACK SURGERY     lumbar fusion   . BREAST LUMPECTOMY Left 2008  . BREAST SURGERY  2000   LUMP REMOVAL. STAGE 1 CANCER  . CARDIAC CATHETERIZATION    . CATARACT EXTRACTION    . COLONOSCOPY    . EYE SURGERY    . IMPLANTABLE CARDIOVERTER DEFIBRILLATOR GENERATOR CHANGE N/A 12/18/2012   Procedure: IMPLANTABLE CARDIOVERTER DEFIBRILLATOR GENERATOR CHANGE;  Surgeon: [name], MD;  Location[facility] C[facility] LAB;  Service: Cardiovascular;  Laterality: N/A;  . JOINT REPLACEMENT  06/14/01   right  . LUMBAR FUSION  2006  . MASS EXCISION  11/08/2011   Procedure: EXCISION MASS;  Surgeon: [name], MD;  Location: [facility] OR[facility];  Service: General;  Laterality: Left;  Excision Left Thigh Mass  . MASTECTOMY W/ SENTINEL NODE BIOPSY Left 06/04/2019   Procedure: LEFT  MASTECTOMY WITH SENTINEL LYMPH NODE BIOPSY;  Surgeon: [name], MD;  Location: [place];  Service: General;  Laterality: Left;  Marland Kitchen MASTECTOMY, PARTIAL  2008   GOT PACEMAKER AND DEFIB AT THAT TIME  . PACEMAKER INSERTION  04/23/06  . TOTAL KNEE ARTHROPLASTY  05/17/01   RIGHT KNEE  . TOTAL KNEE ARTHROPLASTY Left 11/29/2014   Procedure: TOTAL LEFT KNEE ARTHROPLASTY;  Surgeon: Paralee Cancel, MD;  Location: [facility];  Service: Orthopedics;  Laterality: Left;    Home Medications:  Allergies as of 05/05/2020      Reactions   Iodine Shortness Of Breath   Iodine contrast, CHF , SOB   Shellfish Allergy Shortness Of Breath   Memantine    Malaise,  fogginess/ couldn't think   Aspirin Nausea And Vomiting   Codeine Nausea And Vomiting      Medication List       Accurate as of May 05, 2020 10:44 AM. If you have any questions, ask your nurse or doctor.        acetaminophen 650 MG CR tablet Commonly known as: TYLENOL Take 650 mg by mouth every 8 (eight) hours as needed for pain.   acetaminophen 500 MG tablet Commonly known as: TYLENOL Take 500 mg by mouth as needed.   amLODipine 5 MG tablet Commonly known as: NORVASC Take 1 tablet (5 mg total) by mouth daily.   ascorbic acid 500 MG tablet Commonly known as: VITAMIN C Take 500 mg by mouth daily.   carvedilol 3.125 MG tablet Commonly known as: COREG TAKE 1 TABLET BY MOUTH  TWICE DAILY   FISH OIL PO Take 1 capsule by mouth daily.   fluticasone 50 MCG/ACT nasal spray Commonly known as: FLONASE Place 2 sprays into both nostrils daily.   furosemide 20 MG tablet Commonly known as: LASIX TAKE 1 TABLET BY MOUTH  DAILY   levothyroxine 100 MCG tablet Commonly known as: SYNTHROID Take 1 tablet (100 mcg total) by mouth daily before breakfast.   Lidocaine 4 % Ptch Apply 1 patch topically every 12 (twelve) hours. Apply to back   Magnesium 250 MG Tabs Take 250 mg by mouth daily.   meclizine 12.5 MG tablet Commonly known as: ANTIVERT Take 1 tablet (12.5 mg total) by mouth 3 (three) times daily as needed for dizziness.   mirabegron ER 50 MG Tb24 tablet Commonly known as: Myrbetriq Take 1 tablet (50 mg total) by mouth daily.   NONFORMULARY OR COMPOUNDED ITEM Apply 120 Tubes topically daily. Diclofenac/Cyclobenzaprine/lamotrigine/lidocaine/prilocaine (10480) 2%/2%/6%/5%/1.25% cream QTY: 120 GM SIG: (NEURO) APPLY 1-2 PUMPS (1-2 GMS) TO AFFECTED AREA (S) OR FOCAL POINTS 3 TO 4 TIMES DAILY. RUB IN FOR 2 MINUTES TO ACHIEVE MAX PENETRATION. [place] HANDS WELL.   omeprazole 40 MG capsule Commonly known as: PRILOSEC TAKE 1 CAPSULE BY MOUTH  ONCE DAILY FOR STOMACH    Oxcarbazepine 300 MG tablet Commonly known as: TRILEPTAL Take 300 mg by mouth daily.   polyethylene glycol powder 17 GM/SCOOP powder Commonly known as: GLYCOLAX/MIRALAX Take 1 Container by mouth as needed.   potassium chloride 10 MEQ tablet Commonly known as: KLOR-CON TAKE 1 TABLET BY MOUTH  DAILY   senna-docusate 8.6-50 MG tablet Commonly known as: Senokot S Take 2 tablets by mouth daily.   sertraline 100 MG tablet Commonly known as: ZOLOFT Take 200 mg by mouth daily.   spironolactone 25 MG tablet Commonly known as: ALDACTONE TAKE 1 TABLET BY MOUTH IN  THE MORNING   tiZANidine 2 MG tablet Commonly known as:  ZANAFLEX Take 1 tablet (2 mg total) by mouth daily as needed for muscle spasms.   Vitamin D3 50 MCG (2000 UT) Tabs Take 2,000 Units by mouth daily.   Vitamin E 400 units Tabs Take 400 Units by mouth daily.       Allergies:  Allergies  Allergen Reactions  . Iodine Shortness Of Breath    Iodine contrast, CHF , SOB  . Shellfish Allergy Shortness Of Breath  . Memantine     Malaise, fogginess/ couldn't think  . Aspirin Nausea And Vomiting  . Codeine Nausea And Vomiting    Family History: Family History  Problem Relation Age of Onset  . Hypertension Mother   . Arthritis Mother   . Hypertension Father   . Hypertension Brother   . Hypertension Brother     Social History:   reports that she has never smoked. She has never used smokeless tobacco. She reports that she does not drink alcohol and does not use drugs.  Physical Exam: BP 130/74   Pulse 75   Ht 4\' 11"  (1.499 m)   Wt 120 lb (54.4 kg)   LMP  (LMP Unknown)   BMI 24.24 kg/m   Constitutional:  Alert and oriented, no acute distress, nontoxic appearing HEENT: [place], AT Cardiovascular: No clubbing, cyanosis, or edema Respiratory: Normal respiratory effort, no increased work of breathing GU: No CVA tenderness Skin: No rashes, bruises or suspicious lesions Neurologic: Grossly intact, no focal  deficits, moving all 4 extremities Psychiatric: Normal mood and affect  Laboratory Data: Results for orders placed or performed in visit on 05/05/20  Bladder Scan (Post Void Residual) in office  Result Value Ref Range   Scan Result 1mL    Assessment & Plan:   1. Overactive bladder Nocturia improved on Myrbetriq 50 mg daily, will continue this and refill with plans for symptom recheck in 1 year.  PVR stable.  Patient is in agreement with this plan. - Bladder Scan (Post Void Residual) in office - mirabegron ER (MYRBETRIQ) 50 MG TB24 tablet; Take 1 tablet (50 mg total) by mouth daily.  Dispense: 30 tablet; Refill: 11  2. Left flank pain Likely MSK with no recent evidence of urolithiasis on imaging and no other new urinary symptoms.  Low suspicion for acute stone episode at this time, however will obtain renal ultrasound to rule out hydronephrosis.  Will contact patient with results. - US RENAL; Future  Return in about 1 year (around 05/05/2021) for Symptom recheck with PVR.  Debroah Loop, PA-C  [facility] [address], [place], [place] [address] [phone]

## 2020-05-10 ENCOUNTER — Ambulatory Visit (INDEPENDENT_AMBULATORY_CARE_PROVIDER_SITE_OTHER): Payer: Medicare Other

## 2020-05-10 ENCOUNTER — Other Ambulatory Visit: Payer: Self-pay

## 2020-05-10 DIAGNOSIS — M810 Age-related osteoporosis without current pathological fracture: Secondary | ICD-10-CM | POA: Diagnosis not present

## 2020-05-10 MED ORDER — DENOSUMAB 60 MG/ML ~~LOC~~ SOSY
60.0000 mg | PREFILLED_SYRINGE | Freq: Once | SUBCUTANEOUS | Status: AC
  Administered 2020-05-10: 60 mg via SUBCUTANEOUS

## 2020-05-15 NOTE — Progress Notes (Signed)
Remote ICD transmission.   

## 2020-05-18 ENCOUNTER — Ambulatory Visit
Admission: RE | Admit: 2020-05-18 | Discharge: 2020-05-18 | Disposition: A | Discharge status: Home / Self Care | Payer: Medicare Other | Source: Ambulatory Visit | Attending: Physician Assistant | Admitting: Physician Assistant

## 2020-05-18 ENCOUNTER — Other Ambulatory Visit: Payer: Self-pay

## 2020-05-18 DIAGNOSIS — R109 Unspecified abdominal pain: Secondary | ICD-10-CM | POA: Diagnosis not present

## 2020-05-19 ENCOUNTER — Telehealth: Payer: Self-pay | Admitting: Physician Assistant

## 2020-05-19 DIAGNOSIS — N281 Cyst of kidney, acquired: Secondary | ICD-10-CM

## 2020-05-19 NOTE — Telephone Encounter (Signed)
I just spoke with the patient and her daughter via telephone.  I explained that her renal ultrasound shows no urolithiasis or hydronephrosis concerning for acute stone episode, however there does appear to be a new left upper pole renal lesion consistent with possible cyst.  There does not appear to be blood flow to the region on Doppler, which is preliminarily reassuring for mass or malignancy.  Radiology has recommended follow-up contrast imaging for further evaluation of this area, given no other findings on the scan to account for her new left flank pain.  Patient reports she continues to have left flank pain and that it is quite severe.  She wishes to proceed with further imaging.  I am in agreement with this plan.  Notably, patient has an iodine allergy.  We will plan for MR abdomen with and without contrast to avoid iodinated contrast.  Will defer pelvic imaging in the absence of microhematuria. Will contact patient with results.  Notably, patient has an implanted pacemaker and defibrillator, brand [employer], placed in 2014. She is also s/p bilateral knee replacement.

## 2020-05-19 NOTE — Telephone Encounter (Signed)
LMOM for patient to return my call re: renal US results.

## 2020-05-25 ENCOUNTER — Other Ambulatory Visit: Payer: Self-pay | Admitting: Nurse Practitioner

## 2020-05-25 ENCOUNTER — Other Ambulatory Visit: Payer: Self-pay | Admitting: Internal Medicine

## 2020-05-25 NOTE — Telephone Encounter (Signed)
Patient has request refill on medications Furosemide, Potassium Chloride, and Spironolactone. Patient last refill for furosemide 11/30/2019, Potassium Chloride 11/30/2019, and Spironolactone 11/30/2019. I tried to send medication into pharmacy but many warnings appeared. Medication pend and sent to PCP [name], NP . Please Advise.

## 2020-05-26 ENCOUNTER — Telehealth: Payer: Self-pay | Admitting: Physician Assistant

## 2020-05-26 DIAGNOSIS — R93422 Abnormal radiologic findings on diagnostic imaging of left kidney: Secondary | ICD-10-CM

## 2020-05-26 NOTE — Telephone Encounter (Signed)
Please contact the patient and inform her that unfortunately, her pacemaker was deemed unsafe for MRI.  I recommend that we proceed with a noncontrast CT scan of her abdomen and pelvis at this point.  This will give [place] a better picture of the new likely cyst on her left kidney and we can repeat this in the future if needed to assess for any changes.  I have placed an order today and the imaging department will contact her to schedule this.

## 2020-05-26 NOTE — Telephone Encounter (Signed)
Pt aware and verbalized understanding.  

## 2020-06-08 ENCOUNTER — Other Ambulatory Visit: Payer: Self-pay

## 2020-06-08 ENCOUNTER — Ambulatory Visit
Admission: RE | Admit: 2020-06-08 | Discharge: 2020-06-08 | Disposition: A | Discharge status: Home / Self Care | Payer: Medicare Other | Source: Ambulatory Visit | Attending: Physician Assistant | Admitting: Physician Assistant

## 2020-06-08 DIAGNOSIS — R93422 Abnormal radiologic findings on diagnostic imaging of left kidney: Secondary | ICD-10-CM | POA: Diagnosis present

## 2020-06-15 ENCOUNTER — Other Ambulatory Visit: Payer: Self-pay | Admitting: Physician Assistant

## 2020-06-15 DIAGNOSIS — N281 Cyst of kidney, acquired: Secondary | ICD-10-CM

## 2020-06-16 ENCOUNTER — Telehealth: Payer: Self-pay

## 2020-06-16 NOTE — Telephone Encounter (Signed)
-----   Message from Debroah Loop, [place] sent at 06/15/2020  1:26 PM EST ----- Please contact the patient and inform her that I had reviewed the results of her recent CT scan.  The new left renal cyst that we are evaluating does not appear on the CT scan.  There could be several reasons for this, including that the CT scan was performed without contrast or that it was not a significant finding on her original renal ultrasound.  Regardless, I do not see anything that is worrisome in regard to her kidneys on her imaging and we do not need to work this up further at this time.  Based on these findings, I do not believe her left flank pain to be urologic in origin and recommend that she follow-up with physical therapy regarding this.  I do recommend that we repeat a renal ultrasound prior to her next annual visit with me in early 2023 to ensure that there are no new changes in the interim.  I will place an order for this today. ----- Message ----- From: Interface, Rad Results In Sent: 06/09/2020   9:17 AM EST To: Debroah Loop, PA-C

## 2020-06-16 NOTE — Telephone Encounter (Signed)
Notified patient as advised, patient expressed understanding. Patient does note there was no contrast used. Confirmed next appt for January and advised patient to have RUS done prior to that visit.

## 2020-06-17 ENCOUNTER — Other Ambulatory Visit: Payer: Self-pay | Admitting: *Deleted

## 2020-06-17 DIAGNOSIS — C50912 Malignant neoplasm of unspecified site of left female breast: Secondary | ICD-10-CM

## 2020-06-19 NOTE — Progress Notes (Signed)
HEMATOLOGY/ONCOLOGY CLINIC NOTE  Date of Service: 06/19/2020  Patient Care Team: [name], NP as PCP - General (Geriatric Medicine) [name], MD as Consulting Physician (General Surgery) Paralee Cancel, MD as Consulting Physician (Orthopedic Surgery) Marica Otter, Juda (Optometry) Marcial Pacas, MD as Consulting Physician (Neurology) [name], MD as Consulting Physician (Cardiology) Ricard Dillon, MD (Psychiatry)  CHIEF COMPLAINTS/PURPOSE OF CONSULTATION:   F/u Breast cancer  HISTORY OF PRESENTING ILLNESS:   [name] is a wonderful 87 y.o. female who has been referred to [place] by Sherrie Mustache, NP for evaluation and management of easily bruising. The pt reports that she is doing well overall.   The pt reports that she has seen bruises on her arm, and denies bumping into things or any trauma. The pt notes that her bruises are solely located to her upper extremities. She denies concerns for bleeding in her joints, blood in the stools, blood in the urine, nose bleeds or gum bleeds. She notes she has bruised easily for about 2 years. She also notes that she began taking Zoloft about two years ago, and fish oil 4-5 years ago. She is not on any blood thinners. She denies taking any other new medications in the last couple years. The pt denies any thick bruises at any time. The pt denies excessive bleeding with pervious surgeries and dental extractions. The pt denies heavy periods when she was younger.  She has had two knee replacements and took Tramadol for her pain. She denies needing to use this frequently, and takes Tylenol for mild pains.    The pt takes Vitamin E oil for hot flashes, and notes that her Vitamin E oil successfully resolved her hot flashes. She began taking Vitamin E about 18 months ago.   The pt notes that she has lost about 20 pounds over two years. The pt reports some constipation and denies difficulty swallowing, and weak appetite.  The pt denies any dietary restrictions. She continues annual mammograms. She has a history of left sided breast cancer treated with a lumpectomy and radiation.  She notes that her energy levels have also decreased in the last 6 months, and "feels tired all the time." She endorses feeling well rested after taking naps. She notes that she does feel depressed but that taking Zoloft keeps her "afloat." She notes that she feels "medium" enjoyment in her activities. She sees psychiatry every 6 months.  The pt lives with her husband.   Most recent lab results (02/05/18) of CBC w/diff and CMP is as follows: all values are WNL except for PLT at 117k, BUN at 30, Creatinine at 0.96.  On review of systems, pt reports some stable depression, easily bruising on upper extremities, some weight loss, and denies nose bleeds, gum bleeds, blood in the urine, blood in the stools, abdominal pains, leg swelling, and any other symptoms.   On Family Hx the pt denies bleeding disorders.   INTERVAL HISTORY:   [name] is a wonderful 87 y.o. female who is here for evaluation and management of Stage 1B high-grade triple neg breast cancer. The patient's last visit with [place] was on 12/23/2019. The pt reports that she is doing well overall. We are joined today by her daughter.  The pt had a mammogram on 03/18/2020 that revealed no evidence of malignancy.  The pt reports that she has had a sore spot in her left axillary fold but suggests muscle tenderness on examination. The pt reports that  there sleeve has helped with her lymphedema. She only wears this when she sees that it is starting to swell. The pt notes she has been experiencing dizziness, suggestive of BPPV for the last three weeks, but has not visited her PCP yet. She notes this is more of a spinning feeling and occurs when she stands up and lays down. The pt notes that she has not been as hungry lately or sometimes forgets to eat. The pt notes that she has had  some constipation, but takes Senna and Miralax for this.  Lab results today 06/20/2020 of CBC w/diff and CMP is as follows: all values are WNL except for Plt of 124K, GFR est of 57.  On review of systems, pt reports left breast soreness, dizziness, acute headaches and denies nausea, change in bowel habits, leg swelling, abdominal pain, and any other symptoms.  MEDICAL HISTORY:  Past Medical History:  Diagnosis Date   Arthritis    Cancer ([place])    left breast cancer    Cataracts, bilateral    removed by surgery   CHF (congestive heart failure) ([place])    PACEMAKER & DEFIB   Complication of anesthesia    hypotensive after back surgery in 2006   Depression    Dyslipidemia    Fainted 04/21/06   AT [facility]   GERD (gastroesophageal reflux disease)    Headache(784.0)    Hearing loss    bilateral hearing aids   HLD (hyperlipidemia)    diet controlled    Hypertension    Hypothyroidism    ICD (implantable cardiac defibrillator) in place    pt has pacer/icd   ICD (implantable cardiac defibrillator), biventricular, in situ    LBBB (left bundle branch block)    Memory loss    Nonischemic cardiomyopathy ([place])    Normal coronary arteries    s/p cardiac cath 2007   Pacemaker    ICD [employer]   Syncope    Systolic CHF [facility])    Vertigo    Wears glasses     SURGICAL HISTORY: Past Surgical History:  Procedure Laterality Date   BACK SURGERY     lumbar fusion    BREAST LUMPECTOMY Left 2008   BREAST SURGERY  2000   LUMP REMOVAL. STAGE 1 CANCER   CARDIAC CATHETERIZATION     CATARACT EXTRACTION     COLONOSCOPY     EYE SURGERY     IMPLANTABLE CARDIOVERTER DEFIBRILLATOR GENERATOR CHANGE N/A 12/18/2012   Procedure: IMPLANTABLE CARDIOVERTER DEFIBRILLATOR GENERATOR CHANGE;  Surgeon: [name], MD;  Location: [facility] CATH LAB;  Service: Cardiovascular;  Laterality: N/A;   JOINT REPLACEMENT  06/14/01   right   LUMBAR FUSION  2006   MASS EXCISION   11/08/2011   Procedure: EXCISION MASS;  Surgeon: [name], MD;  Location: [facility] ORS;  Service: General;  Laterality: Left;  Excision Left Thigh Mass   MASTECTOMY W/ SENTINEL NODE BIOPSY Left 06/04/2019   Procedure: LEFT MASTECTOMY WITH SENTINEL LYMPH NODE BIOPSY;  Surgeon: [name], MD;  Location: [facility];  Service: General;  Laterality: Left;   MASTECTOMY, PARTIAL  2008   GOT PACEMAKER AND DEFIB AT THAT TIME   PACEMAKER INSERTION  04/23/06   TOTAL KNEE ARTHROPLASTY  05/17/01   RIGHT KNEE   TOTAL KNEE ARTHROPLASTY Left 11/29/2014   Procedure: TOTAL LEFT KNEE ARTHROPLASTY;  Surgeon: Paralee Cancel, MD;  Location: [facility];  Service: Orthopedics;  Laterality: Left;    SOCIAL HISTORY: Social History   Socioeconomic History  Marital status: Married    Spouse name: Not on file   Number of children: 1   Years of education: Masters   Highest education level: Not on file  Occupational History   Occupation: Retired  Tobacco Use   Smoking status: Never Smoker   Smokeless tobacco: Never Used  Scientific laboratory technician Use: Never used  Substance and Sexual Activity   Alcohol use: No   Drug use: No   Sexual activity: Not Currently    Birth control/protection: Post-menopausal  Other Topics Concern   Not on file  Social History Narrative   Lives at home with husband.   Right-handed.      As of 07/28/2014   Diet: No special diet   Caffeine: yes, Chocolate, tea and sodas    Married: YES, 1970   House: Yes, 2 stories, 2-3 persons live in home   Pets: No   Current/Past profession: Engineer, mining, Designer, jewellery    Exercise: Yes 2-3 x weekly   Living Will: Yes   DNR: No   POA/HPOA: No      Social Determinants of Radio broadcast assistant Strain: Not on file  Food Insecurity: Not on file  Transportation Needs: Not on file  Physical Activity: Not on file  Stress: Not on file  Social Connections: Not on file  Intimate Partner Violence: Not on file    FAMILY  HISTORY: Family History  Problem Relation Age of Onset   Hypertension Mother    Arthritis Mother    Hypertension Father    Hypertension Brother    Hypertension Brother     ALLERGIES:  is allergic to iodine, shellfish allergy, memantine, aspirin, and codeine.  MEDICATIONS:  Current Outpatient Medications  Medication Sig Dispense Refill   acetaminophen (TYLENOL) 500 MG tablet Take 500 mg by mouth as needed.     acetaminophen (TYLENOL) 650 MG CR tablet Take 650 mg by mouth every 8 (eight) hours as needed for pain.     amLODipine (NORVASC) 5 MG tablet Take 1 tablet (5 mg total) by mouth daily. 90 tablet 1   ascorbic acid (VITAMIN C) 500 MG tablet Take 500 mg by mouth daily.     carvedilol (COREG) 3.125 MG tablet TAKE 1 TABLET BY MOUTH  TWICE DAILY 180 tablet 3   Cholecalciferol (VITAMIN D3) 50 MCG (2000 UT) TABS Take 2,000 Units by mouth daily.      fluticasone (FLONASE) 50 MCG/ACT nasal spray Place 2 sprays into both nostrils daily. 16 g 3   furosemide (LASIX) 20 MG tablet TAKE 1 TABLET BY MOUTH  DAILY 90 tablet 3   levothyroxine (SYNTHROID, LEVOTHROID) 100 MCG tablet Take 1 tablet (100 mcg total) by mouth daily before breakfast. 90 tablet 3   Lidocaine 4 % PTCH Apply 1 patch topically every 12 (twelve) hours. Apply to back     Magnesium 250 MG TABS Take 250 mg by mouth daily.     meclizine (ANTIVERT) 12.5 MG tablet Take 1 tablet (12.5 mg total) by mouth 3 (three) times daily as needed for dizziness. 30 tablet 1   mirabegron ER (MYRBETRIQ) 50 MG TB24 tablet Take 1 tablet (50 mg total) by mouth daily. 30 tablet 11   NONFORMULARY OR COMPOUNDED ITEM Apply 120 Tubes topically daily. Diclofenac/Cyclobenzaprine/lamotrigine/lidocaine/prilocaine (10480) 2%/2%/6%/5%/1.25% cream QTY: 120 GM SIG: (NEURO) APPLY 1-2 PUMPS (1-2 GMS) TO AFFECTED AREA (S) OR FOCAL POINTS 3 TO 4 TIMES DAILY. RUB IN FOR 2 MINUTES TO ACHIEVE MAX PENETRATION. [place] HANDS WELL.  1 each 2   Omega-3 Fatty  Acids (FISH OIL PO) Take 1 capsule by mouth daily.     omeprazole (PRILOSEC) 40 MG capsule TAKE 1 CAPSULE BY MOUTH  ONCE DAILY FOR STOMACH 90 capsule 3   Oxcarbazepine (TRILEPTAL) 300 MG tablet Take 300 mg by mouth daily.      polyethylene glycol powder (GLYCOLAX/MIRALAX) 17 GM/SCOOP powder Take 1 Container by mouth as needed. (Patient not taking: Reported on 05/05/2020)     potassium chloride (KLOR-CON) 10 MEQ tablet TAKE 1 TABLET BY MOUTH  DAILY 90 tablet 3   senna-docusate (SENOKOT S) 8.6-50 MG tablet Take 2 tablets by mouth daily. 60 tablet 5   sertraline (ZOLOFT) 100 MG tablet Take 200 mg by mouth daily.      spironolactone (ALDACTONE) 25 MG tablet TAKE 1 TABLET BY MOUTH IN  THE MORNING 90 tablet 3   tiZANidine (ZANAFLEX) 2 MG tablet Take 1 tablet (2 mg total) by mouth daily as needed for muscle spasms. 30 tablet 0   Vitamin E 400 units TABS Take 400 Units by mouth daily.      No current facility-administered medications for this visit.    REVIEW OF SYSTEMS:   10 Point review of Systems was done is negative except as noted above.  PHYSICAL EXAMINATION  Vitals:   06/20/20 1007  BP: 132/74  Pulse: 78  Resp: 13  Temp: 97.6 F (36.4 C)  SpO2: 97%   Filed Weights   06/20/20 1007  Weight: 119 lb 14.4 oz (54.4 kg)   .Body mass index is 24.22 kg/m.  GENERAL:alert, in no acute distress and comfortable SKIN: no acute rashes, no significant lesions EYES: conjunctiva are pink and non-injected, sclera anicteric OROPHARYNX: MMM, no exudates, no oropharyngeal erythema or ulceration NECK: supple, no JVD LYMPH:  no palpable lymphadenopathy in the cervical, axillary or inguinal regions LUNGS: clear to auscultation b/l with normal respiratory effort HEART: regular rate & rhythm ABDOMEN:  normoactive bowel sounds , non tender, not distended. Extremity: no pedal edema PSYCH: alert & oriented x 3 with fluent speech NEURO: no focal motor/sensory deficits Breast exam -no palpable  right breast masses.  Status post left total mastectomy.  No chest wall skin changes or nodules noted.  No regional adenopathy bilaterally.  LABORATORY DATA:  I have reviewed the data as listed  . CBC Latest Ref Rng & Units 06/20/2020 01/28/2020 01/12/2020  WBC 4.0 - 10.5 K/uL 7.8 8.1 9.1  Hemoglobin 12.0 - 15.0 g/dL 13.7 13.6 12.7  Hematocrit 36.0 - 46.0 % 41.2 41.2 38.0  Platelets 150 - 400 K/uL 124(L) 120(L) 129(L)    . CMP Latest Ref Rng & Units 06/20/2020 01/28/2020 12/23/2019  Glucose 70 - 99 mg/dL 98 91 94  BUN 8 - 23 mg/dL 16 31(H) 26(H)  Creatinine 0.44 - 1.00 mg/dL 0.98 0.95(H) 0.91  Sodium 135 - 145 mmol/L 140 141 139  Potassium 3.5 - 5.1 mmol/L 4.7 4.5 4.3  Chloride 98 - 111 mmol/L 104 102 104  CO2 22 - 32 mmol/L 29 33(H) 29  Calcium 8.9 - 10.3 mg/dL 10.1 10.5(H) 10.1  Total Protein 6.5 - 8.1 g/dL 7.8 7.2 7.6  Total Bilirubin 0.3 - 1.2 mg/dL 0.3 0.4 0.3  Alkaline Phos 38 - 126 U/L 68 - 69  AST 15 - 41 U/L 26 26 26   ALT 0 - 44 U/L 26 27 24      03/18/2020 Mammogram   RADIOGRAPHIC STUDIES: I have personally reviewed the radiological images as listed and  agreed with the findings in the report.  ASSESSMENT & PLAN:   87 y.o. female with  1. H/o Easily bruising - labs did not demonstrate any specific bleeding diathesis 2. Stage IB ER/PR/Her 2 negative, grade 3 left breast cancer s/p mastectomy. NegSNLBx. 06/04/2019. Was not considered to be a good candidate for adjuvant chemotherapy given her age and medical issues. 3. LUE lymphedema-left breast mastectomy and lymph node biopsy.  Controlled with use of lymphedema sleeve.  Discussed maintaining compliance with use of her sleeve.  PLAN: -Discussed pt labwork today, 06/20/2020; blood counts normal, chemistries normal. -Recommended pt drink 48-64 oz water daily, more towards earlier part of the day.  -Advised pt to create a meal plan and eat regularly to avoid not eating. -Recommended pt wear arm sleeve continuously  during day, even if asymptomatic at time.  -Recommended pt wear compression socks on right leg. Start with less tight socks and work up if needed. This should help with decreasing frequent night urination. -Advised pt to f/u w PCP regarding dizziness an potential vertigo problem in ear. Advised pt that it sounds like benign positional vertigo and recommended Vestibular positional therapy.   -Discussed CDC guidelines regarding COVID19 vaccinations and booster. No second booster at this time. -Discussed [place] and pt's eligibility.  -No lab or clinical evidence of breast cancer progression at this time. Will continue to monitor. -Continue 2000 IU Vitamin D daily  -Will see back in 6 months with labs.  2)  Patient Active Problem List   Diagnosis Date Noted   Dementia without behavioral disturbance ([place]) 02/04/2020   Urinary retention 02/04/2020   Seasonal allergies 02/04/2020   Recurrent major depressive disorder, in partial remission ([place]) 07/17/2019   Status post left mastectomy 07/01/2019   Breast cancer of lower-outer quadrant of left female breast ([place]) 06/04/2019   Candida infection, oral 01/15/2019   Senile purpura ([place]) 04/14/2018   Lumbar post-laminectomy syndrome 12/03/2017   Lumbar spondylosis 12/03/2017   History of back surgery 09/01/2017   Constipation 09/01/2017   Hypothyroidism due to acquired atrophy of thyroid 09/01/2017   Mixed hyperlipidemia 09/01/2017   Age-related osteoporosis without current pathological fracture 09/01/2017   High risk medication use 09/01/2017   Arthritis of hand 05/17/2017   Atherosclerosis of native arteries of extremity with intermittent claudication ([place]) 05/15/2017   Status post total bilateral knee replacement 12/20/2016   Gait abnormality 07/16/2016   Chronic low back pain 07/16/2016   Mild cognitive impairment 07/16/2016   Wrist pain 05/10/2015   S/P left TKA 11/29/2014   S/P knee replacement 11/29/2014    Spontaneous bruising 08/27/2014   Numbness and tingling in right hand 07/28/2014   Essential tremor 07/28/2014   Dizziness 05/28/2014   Shaky 05/28/2014   Memory loss 05/28/2014   Depression 05/06/2014   Lipoma of left upper thigh 3x5 cm 09/28/2011   Cerebral vascular accident ([place]) 08/09/2011   Syncope 08/09/2011   History of breast cancer [id number], s/p BCT 2008, triple negative 01/26/2011   Chronic L breast pain with chronic recurrent seroma, s/p excisional biopsy 01/12/2010 01/26/2011   Fainted    ICD (implantable cardioverter-defibrillator), biventricular, in situ 39/76/7341   Chronic systolic heart failure ([place]) 08/02/2010   Hypertension 08/02/2010   -continue f/u with PCP for mx of other chronic medical issues.  FOLLOW UP: RTC with Dr Irene Limbo with labs in 6 months    The total time spent in the appointment was 20 minutes and more than 50% was on counseling and direct patient cares.  All of the patient's questions were answered with apparent satisfaction. The patient knows to call the clinic with any problems, questions or concerns.    Sullivan Lone MD [place] AAHIVMS [facility] Hematology/Oncology Physician [facility]  (Office):       [phone] (Work cell):  [phone] (Fax):           [phone]  06/19/2020 10:35 AM  I, Reinaldo Raddle, am acting as scribe for Dr. Sullivan Lone, MD.   .I have reviewed the above documentation for accuracy and completeness, and I agree with the above. Brunetta Genera MD

## 2020-06-20 ENCOUNTER — Other Ambulatory Visit: Payer: Self-pay

## 2020-06-20 ENCOUNTER — Inpatient Hospital Stay (HOSPITAL_BASED_OUTPATIENT_CLINIC_OR_DEPARTMENT_OTHER): Payer: Medicare Other | Admitting: Hematology

## 2020-06-20 ENCOUNTER — Inpatient Hospital Stay: Payer: Medicare Other | Attending: Hematology

## 2020-06-20 VITALS — BP 132/74 | HR 78 | Temp 97.6°F | Resp 13 | Ht 59.0 in | Wt 119.9 lb

## 2020-06-20 DIAGNOSIS — Z853 Personal history of malignant neoplasm of breast: Secondary | ICD-10-CM | POA: Insufficient documentation

## 2020-06-20 DIAGNOSIS — I10 Essential (primary) hypertension: Secondary | ICD-10-CM | POA: Diagnosis not present

## 2020-06-20 DIAGNOSIS — Z79899 Other long term (current) drug therapy: Secondary | ICD-10-CM | POA: Insufficient documentation

## 2020-06-20 DIAGNOSIS — C50912 Malignant neoplasm of unspecified site of left female breast: Secondary | ICD-10-CM

## 2020-06-20 DIAGNOSIS — Z9012 Acquired absence of left breast and nipple: Secondary | ICD-10-CM | POA: Insufficient documentation

## 2020-06-20 DIAGNOSIS — Z171 Estrogen receptor negative status [ER-]: Secondary | ICD-10-CM

## 2020-06-20 DIAGNOSIS — R233 Spontaneous ecchymoses: Secondary | ICD-10-CM | POA: Diagnosis present

## 2020-06-20 DIAGNOSIS — E119 Type 2 diabetes mellitus without complications: Secondary | ICD-10-CM | POA: Insufficient documentation

## 2020-06-20 DIAGNOSIS — I972 Postmastectomy lymphedema syndrome: Secondary | ICD-10-CM | POA: Insufficient documentation

## 2020-06-20 LAB — CBC WITH DIFFERENTIAL (CANCER CENTER ONLY)
Abs Immature Granulocytes: 0.03 10*3/uL (ref 0.00–0.07)
Basophils Absolute: 0.1 10*3/uL (ref 0.0–0.1)
Basophils Relative: 1 %
Eosinophils Absolute: 0.2 10*3/uL (ref 0.0–0.5)
Eosinophils Relative: 3 %
HCT: 41.2 % (ref 36.0–46.0)
Hemoglobin: 13.7 g/dL (ref 12.0–15.0)
Immature Granulocytes: 0 %
Lymphocytes Relative: 19 %
Lymphs Abs: 1.5 10*3/uL (ref 0.7–4.0)
MCH: 28.6 pg (ref 26.0–34.0)
MCHC: 33.3 g/dL (ref 30.0–36.0)
MCV: 86 fL (ref 80.0–100.0)
Monocytes Absolute: 0.9 10*3/uL (ref 0.1–1.0)
Monocytes Relative: 11 %
Neutro Abs: 5.2 10*3/uL (ref 1.7–7.7)
Neutrophils Relative %: 66 %
Platelet Count: 124 10*3/uL — ABNORMAL LOW (ref 150–400)
RBC: 4.79 MIL/uL (ref 3.87–5.11)
RDW: 15.5 % (ref 11.5–15.5)
WBC Count: 7.8 10*3/uL (ref 4.0–10.5)
nRBC: 0 % (ref 0.0–0.2)

## 2020-06-20 LAB — CMP (CANCER CENTER ONLY)
ALT: 26 U/L (ref 0–44)
AST: 26 U/L (ref 15–41)
Albumin: 4 g/dL (ref 3.5–5.0)
Alkaline Phosphatase: 68 U/L (ref 38–126)
Anion gap: 7 (ref 5–15)
BUN: 16 mg/dL (ref 8–23)
CO2: 29 mmol/L (ref 22–32)
Calcium: 10.1 mg/dL (ref 8.9–10.3)
Chloride: 104 mmol/L (ref 98–111)
Creatinine: 0.98 mg/dL (ref 0.44–1.00)
GFR, Estimated: 57 mL/min — ABNORMAL LOW (ref >60)
Glucose, Bld: 98 mg/dL (ref 70–99)
Potassium: 4.7 mmol/L (ref 3.5–5.1)
Sodium: 140 mmol/L (ref 135–145)
Total Bilirubin: 0.3 mg/dL (ref 0.3–1.2)
Total Protein: 7.8 g/dL (ref 6.5–8.1)

## 2020-06-21 ENCOUNTER — Ambulatory Visit (INDEPENDENT_AMBULATORY_CARE_PROVIDER_SITE_OTHER): Payer: Medicare Other | Admitting: Internal Medicine

## 2020-06-21 ENCOUNTER — Encounter: Payer: Self-pay | Admitting: Internal Medicine

## 2020-06-21 VITALS — BP 132/76 | HR 76 | Ht 59.0 in | Wt 120.8 lb

## 2020-06-21 DIAGNOSIS — I10 Essential (primary) hypertension: Secondary | ICD-10-CM | POA: Diagnosis not present

## 2020-06-21 DIAGNOSIS — I428 Other cardiomyopathies: Secondary | ICD-10-CM

## 2020-06-21 DIAGNOSIS — Z95810 Presence of automatic (implantable) cardiac defibrillator: Secondary | ICD-10-CM

## 2020-06-21 DIAGNOSIS — I5022 Chronic systolic (congestive) heart failure: Secondary | ICD-10-CM | POA: Diagnosis not present

## 2020-06-21 NOTE — Patient Instructions (Signed)
Medication Instructions:  Your physician recommends that you continue on your current medications as directed. Please refer to the Current Medication list given to you today.  Labwork: None ordered.  Testing/Procedures: None ordered.  Follow-Up: Your physician wants you to follow-up in: one year with Cristopher Peru, MD or one of the following Advanced Practice Providers on your designated Care Team:    Chanetta Marshall, NP  Tommye Standard, PA-C  Legrand Como "Jonni Sanger" [place], [place]  Remote monitoring is used to monitor your ICD from home. This monitoring reduces the number of office visits required to check your device to one time per year. It allows [place] to keep an eye on the functioning of your device to ensure it is working properly. You are scheduled for a device check from home on 08/04/2020. You may send your transmission at any time that day. If you have a wireless device, the transmission will be sent automatically. After your physician reviews your transmission, you will receive a postcard with your next transmission date.  Any Other Special Instructions Will Be Listed Below (If Applicable).  If you need a refill on your cardiac medications before your next appointment, please call your pharmacy.

## 2020-06-21 NOTE — Progress Notes (Signed)
HPI [name] returns today for followup. She is a pleasant 87 yo woman with a h/o chronic systolic heart failure, LBBB, s/p biv ICD insertion. She notes some weight loss and dyspnea when she goes up an incline. She does ok on flat ground. No ICD shocks.She has been diagnosed with breast CA and undergone mastectomy. She has done well so far after breast surgery. She is still a little sore. she gets a little dizzy when she gets up in the morning.  Allergies  Allergen Reactions  . Iodine Shortness Of Breath    Iodine contrast, CHF , SOB  . Shellfish Allergy Shortness Of Breath  . Memantine     Malaise, fogginess/ couldn't think  . Aspirin Nausea And Vomiting  . Codeine Nausea And Vomiting     Current Outpatient Medications  Medication Sig Dispense Refill  . acetaminophen (TYLENOL) 500 MG tablet Take 500 mg by mouth as needed.    Marland Kitchen acetaminophen (TYLENOL) 650 MG CR tablet Take 650 mg by mouth every 8 (eight) hours as needed for pain.    Marland Kitchen amLODipine (NORVASC) 5 MG tablet Take 1 tablet (5 mg total) by mouth daily. 90 tablet 1  . ascorbic acid (VITAMIN C) 500 MG tablet Take 500 mg by mouth daily.    Marland Kitchen buPROPion (WELLBUTRIN XL) 150 MG 24 hr tablet Take 450 mg by mouth daily.    . carvedilol (COREG) 3.125 MG tablet TAKE 1 TABLET BY MOUTH  TWICE DAILY 180 tablet 3  . Cholecalciferol (VITAMIN D3) 50 MCG (2000 UT) TABS Take 2,000 Units by mouth daily.     . fluticasone (FLONASE) 50 MCG/ACT nasal spray Place 2 sprays into both nostrils daily. 16 g 3  . furosemide (LASIX) 20 MG tablet TAKE 1 TABLET BY MOUTH  DAILY 90 tablet 3  . levothyroxine (SYNTHROID, LEVOTHROID) 100 MCG tablet Take 1 tablet (100 mcg total) by mouth daily before breakfast. 90 tablet 3  . Lidocaine 4 % PTCH Apply 1 patch topically every 12 (twelve) hours. Apply to back    . Magnesium 250 MG TABS Take 250 mg by mouth daily.    . meclizine (ANTIVERT) 12.5 MG tablet Take 1 tablet (12.5 mg total) by mouth 3 (three) times  daily as needed for dizziness. 30 tablet 1  . methocarbamol (ROBAXIN) 500 MG tablet Take 500 mg by mouth every 8 (eight) hours as needed for muscle pain.    . mirabegron ER (MYRBETRIQ) 50 MG TB24 tablet Take 1 tablet (50 mg total) by mouth daily. 30 tablet 11  . NONFORMULARY OR COMPOUNDED ITEM Apply 120 Tubes topically daily. Diclofenac/Cyclobenzaprine/lamotrigine/lidocaine/prilocaine (10480) 2%/2%/6%/5%/1.25% cream QTY: 120 GM SIG: (NEURO) APPLY 1-2 PUMPS (1-2 GMS) TO AFFECTED AREA (S) OR FOCAL POINTS 3 TO 4 TIMES DAILY. RUB IN FOR 2 MINUTES TO ACHIEVE MAX PENETRATION. [place] HANDS WELL. 1 each 2  . Omega-3 Fatty Acids (FISH OIL PO) Take 1 capsule by mouth daily.    Marland Kitchen omeprazole (PRILOSEC) 40 MG capsule TAKE 1 CAPSULE BY MOUTH  ONCE DAILY FOR STOMACH 90 capsule 3  . Oxcarbazepine (TRILEPTAL) 300 MG tablet Take 300 mg by mouth daily.     . polyethylene glycol powder (GLYCOLAX/MIRALAX) 17 GM/SCOOP powder Take 1 Container by mouth as needed.    . potassium chloride (KLOR-CON) 10 MEQ tablet TAKE 1 TABLET BY MOUTH  DAILY 90 tablet 3  . senna-docusate (SENOKOT S) 8.6-50 MG tablet Take 2 tablets by mouth daily. 60 tablet 5  . sertraline (  ZOLOFT) 100 MG tablet Take 200 mg by mouth daily.     Marland Kitchen spironolactone (ALDACTONE) 25 MG tablet TAKE 1 TABLET BY MOUTH IN  THE MORNING 90 tablet 3  . tiZANidine (ZANAFLEX) 2 MG tablet Take 1 tablet (2 mg total) by mouth daily as needed for muscle spasms. 30 tablet 0  . Vitamin E 400 units TABS Take 400 Units by mouth daily.      No current facility-administered medications for this visit.     Past Medical History:  Diagnosis Date  . Arthritis   . Cancer [facility])    left breast cancer   . Cataracts, bilateral    removed by surgery  . CHF (congestive heart failure) ([place])    PACEMAKER & DEFIB  . Complication of anesthesia    hypotensive after back surgery in 2006  . Depression   . Dyslipidemia   . Fainted 04/21/06   AT [place]  . GERD (gastroesophageal reflux  disease)   . Headache(784.0)   . Hearing loss    bilateral hearing aids  . HLD (hyperlipidemia)    diet controlled   . Hypertension   . Hypothyroidism   . ICD (implantable cardiac defibrillator) in place    pt has pacer/icd  . ICD (implantable cardiac defibrillator), biventricular, in situ   . LBBB (left bundle branch block)   . Memory loss   . Nonischemic cardiomyopathy ([place])   . Normal coronary arteries    s/p cardiac cath 2007  . Pacemaker    ICD [employer]  . Syncope   . Systolic CHF ([place])   . Vertigo   . Wears glasses     ROS:   All systems reviewed and negative except as noted in the HPI.   Past Surgical History:  Procedure Laterality Date  . BACK SURGERY     lumbar fusion   . BREAST LUMPECTOMY Left 2008  . BREAST SURGERY  2000   LUMP REMOVAL. STAGE 1 CANCER  . CARDIAC CATHETERIZATION    . CATARACT EXTRACTION    . COLONOSCOPY    . EYE SURGERY    . IMPLANTABLE CARDIOVERTER DEFIBRILLATOR GENERATOR CHANGE N/A 12/18/2012   Procedure: IMPLANTABLE CARDIOVERTER DEFIBRILLATOR GENERATOR CHANGE;  Surgeon: [name], MD;  Location: [facility] CATH LAB;  Service: Cardiovascular;  Laterality: N/A;  . JOINT REPLACEMENT  06/14/01   right  . LUMBAR FUSION  2006  . MASS EXCISION  11/08/2011   Procedure: EXCISION MASS;  Surgeon: [name], MD;  Location: [facility];  Service: General;  Laterality: Left;  Excision Left Thigh Mass  . MASTECTOMY W/ SENTINEL NODE BIOPSY Left 06/04/2019   Procedure: LEFT MASTECTOMY WITH SENTINEL LYMPH NODE BIOPSY;  Surgeon: [name], MD;  Location: [place];  Service: General;  Laterality: Left;  Marland Kitchen MASTECTOMY, PARTIAL  2008   GOT PACEMAKER AND DEFIB AT THAT TIME  . PACEMAKER INSERTION  04/23/06  . TOTAL KNEE ARTHROPLASTY  05/17/01   RIGHT KNEE  . TOTAL KNEE ARTHROPLASTY Left 11/29/2014   Procedure: TOTAL LEFT KNEE ARTHROPLASTY;  Surgeon: Paralee Cancel, MD;  [facility];  Service: Orthopedics;  Laterality: Left;     Family History  Problem  Relation Age of Onset  . Hypertension Mother   . Arthritis Mother   . Hypertension Father   . Hypertension Brother   . Hypertension Brother      Social History   Socioeconomic History  . Marital status: Married    Spouse name: Not on file  . Number  of children: 1  . Years of education: Masters  . Highest education level: Not on file  Occupational History  . Occupation: Retired  Tobacco Use  . Smoking status: Never Smoker  . Smokeless tobacco: Never Used  Vaping Use  . Vaping Use: Never used  Substance and Sexual Activity  . Alcohol use: No  . Drug use: No  . Sexual activity: Not Currently    Birth control/protection: Post-menopausal  Other Topics Concern  . Not on file  Social History Narrative   Lives at home with husband.   Right-handed.      As of 07/28/2014   Diet: No special diet   Caffeine: yes, Chocolate, tea and sodas    Married: YES, 1970   House: Yes, 2 stories, 2-3 persons live in home   Pets: No   Current/Past profession: Engineer, mining, Designer, jewellery    Exercise: Yes 2-3 x weekly   Living Will: Yes   DNR: No   POA/HPOA: No      Social Determinants of Radio broadcast assistant Strain: Not on file  Food Insecurity: Not on file  Transportation Needs: Not on file  Physical Activity: Not on file  Stress: Not on file  Social Connections: Not on file  Intimate Partner Violence: Not on file     BP 132/76   Pulse 76   Ht 4\' 11"  (1.499 m)   Wt 120 lb 12.8 oz (54.8 kg)   LMP  (LMP Unknown)   SpO2 94%   BMI 24.40 kg/m   Physical Exam:  Well appearing NAD HEENT: Unremarkable Neck:  No JVD, no thyromegally Lymphatics:  No adenopathy Back:  No CVA tenderness Lungs:  Clear with no wheezes HEART:  Regular rate rhythm, no murmurs, no rubs, no clicks Abd:  soft, positive bowel sounds, no organomegally, no rebound, no guarding Ext:  2 plus pulses, no edema, no cyanosis, no clubbing Skin:  No rashes no nodules Neuro:  CN II through XII  intact, motor grossly intact  EKG - nsr with biv pacing  DEVICE  Normal device function.  See PaceArt for details.   Assess/Plan: 1. Chronic systolic heart failure - her symptoms remain class 2. She will continue her current meds. 2. ICD - her b[place] Sci DDD biv ICD is working normally. We will recheck in several months. 3. Dizzy - her bp is ok today. As her symptoms worsen in the morning, I have asked her to dangle her legs before getting up. 4. HTN - her bp is controlled. No change in meds.  Carleene Overlie Taylor,MD

## 2020-06-29 ENCOUNTER — Other Ambulatory Visit: Payer: Self-pay | Admitting: Nurse Practitioner

## 2020-06-29 DIAGNOSIS — I10 Essential (primary) hypertension: Secondary | ICD-10-CM

## 2020-07-15 ENCOUNTER — Encounter: Payer: Self-pay | Admitting: Nurse Practitioner

## 2020-07-15 ENCOUNTER — Other Ambulatory Visit: Payer: Self-pay

## 2020-07-15 ENCOUNTER — Ambulatory Visit (INDEPENDENT_AMBULATORY_CARE_PROVIDER_SITE_OTHER): Payer: Medicare Other | Admitting: Nurse Practitioner

## 2020-07-15 VITALS — BP 116/66 | HR 72 | Temp 96.6°F | Ht 59.0 in | Wt 117.0 lb

## 2020-07-15 DIAGNOSIS — F0390 Unspecified dementia without behavioral disturbance: Secondary | ICD-10-CM | POA: Diagnosis not present

## 2020-07-15 DIAGNOSIS — F419 Anxiety disorder, unspecified: Secondary | ICD-10-CM

## 2020-07-15 DIAGNOSIS — R1310 Dysphagia, unspecified: Secondary | ICD-10-CM | POA: Diagnosis not present

## 2020-07-15 DIAGNOSIS — R918 Other nonspecific abnormal finding of lung field: Secondary | ICD-10-CM

## 2020-07-15 DIAGNOSIS — N3281 Overactive bladder: Secondary | ICD-10-CM

## 2020-07-15 DIAGNOSIS — I10 Essential (primary) hypertension: Secondary | ICD-10-CM

## 2020-07-15 DIAGNOSIS — K5904 Chronic idiopathic constipation: Secondary | ICD-10-CM

## 2020-07-15 DIAGNOSIS — F32A Depression, unspecified: Secondary | ICD-10-CM

## 2020-07-15 DIAGNOSIS — F319 Bipolar disorder, unspecified: Secondary | ICD-10-CM

## 2020-07-15 DIAGNOSIS — M810 Age-related osteoporosis without current pathological fracture: Secondary | ICD-10-CM

## 2020-07-15 MED ORDER — BUPROPION HCL ER (XL) 150 MG PO TB24: 300.0000 mg | ORAL_TABLET | Freq: Every day | ORAL | Status: DC

## 2020-07-15 NOTE — Progress Notes (Signed)
Careteam: Patient Care Team: Stacey Chandler, NP as PCP - General (Geriatric Medicine) Stacey Klein, MD as Consulting Physician (General Surgery) Stacey Cancel, MD as Consulting Physician (Orthopedic Surgery) [name], [place] (Optometry) Stacey Pacas, MD as Consulting Physician (Neurology) Stacey Lance, MD as Consulting Physician (Cardiology) Stacey Dillon, MD (Psychiatry)  PLACE OF SERVICE:  [place] Directive information Does Patient Have a Medical Advance Directive?: Yes, Type of Advance Directive: Living will;Out of facility DNR (pink MOST or yellow form), Pre-existing out of facility DNR order (yellow form or pink MOST form): Yellow form placed in chart (order not valid for inpatient use), Does patient want to make changes to medical advance directive?: No - Patient declined  Allergies  Allergen Reactions  . Iodine Shortness Of Breath    Iodine contrast, CHF , SOB  . Shellfish Allergy Shortness Of Breath  . Memantine     Malaise, fogginess/ couldn't think  . Aspirin Nausea And Vomiting  . Codeine Nausea And Vomiting    Chief Complaint  Patient presents with  . Medical Management of Chronic Issues    4 month follow-up. Patient c/o of dizziness when getting out of bed and staggering.Discuss muscle spasms and refill for robaxin at [place]. Robaxin works sometimes and sometimes it dosen't. Here with daughter [name]. Patient feels diszzy right now at appointment.      HPI: Patient is a 87 y.o. female here for follow up.   Here today with her daughter who she lives with.  Dizziness- long history of dizziness, experiencing increased dizziness within the last few weeks. Has a history of vertigo with prn meclizine. Took the meclizine but made dizziness symptoms worse. Has not been drinking much water. Worse with changes in position.  HTN- BP today 116/66, taking norvasc 5 mg and coreg 3.125 mg daily  Breast cancer- s/p mastectomy followed by  oncologist every 6 months  Chronic back pain- takes robaxin for muscle spasms, seems to be working.  Bipolar/depression/anxiety- taking wellbutrin and zoloft daily, reports her mood goes up and down. Sees psychiatrist yearly.  Constipation- taking senokot and and miralax, reports it is working well at this time.   Dementia- progressive decline in memory per daughter, some days worse than others. She has seen neurology in the past. Can not tolerate aricept, exelon or namenda due to side effects.    Review of Systems:  Review of Systems  Constitutional: Negative for chills, fever and weight loss.  HENT: Negative for tinnitus.   Respiratory: Negative for cough, sputum production and shortness of breath.   Cardiovascular: Negative for chest pain, palpitations and leg swelling.  Gastrointestinal: Negative for abdominal pain, constipation, nausea and vomiting.  Genitourinary: Negative for dysuria, frequency and urgency.  Musculoskeletal: Positive for back pain and falls. Negative for myalgias.  Skin: Negative.   Neurological: Positive for dizziness. Negative for weakness and headaches.  Psychiatric/Behavioral: Positive for depression. Negative for memory loss. The patient is nervous/anxious. The patient does not have insomnia.    Past Medical History:  Diagnosis Date  . Arthritis   . Cancer [name])    left breast cancer   . Cataracts, bilateral    removed by surgery  . CHF (congestive heart failure) ([place])    PACEMAKER & DEFIB  . Complication of anesthesia    hypotensive after back surgery in 2006  . Depression   . Dyslipidemia   . Fainted 04/21/06   AT [place]  . GERD (gastroesophageal reflux disease)   .  Headache(784.0)   . Hearing loss    bilateral hearing aids  . HLD (hyperlipidemia)    diet controlled   . Hypertension   . Hypothyroidism   . ICD (implantable cardiac defibrillator) in place    pt has pacer/icd  . ICD (implantable cardiac defibrillator), biventricular, in situ    . LBBB (left bundle branch block)   . Memory loss   . Nonischemic cardiomyopathy ([place])   . Normal coronary arteries    s/p cardiac cath 2007  . Pacemaker    ICD [employer]  . Syncope   . Systolic CHF ([place])   . Vertigo   . Wears glasses    Past Surgical History:  Procedure Laterality Date  . BACK SURGERY     lumbar fusion   . BREAST LUMPECTOMY Left 2008  . BREAST SURGERY  2000   LUMP REMOVAL. STAGE 1 CANCER  . CARDIAC CATHETERIZATION    . CATARACT EXTRACTION    . COLONOSCOPY    . EYE SURGERY    . IMPLANTABLE CARDIOVERTER DEFIBRILLATOR GENERATOR CHANGE N/A 12/18/2012   Procedure: IMPLANTABLE CARDIOVERTER DEFIBRILLATOR GENERATOR CHANGE;  Surgeon: Stacey Lance, MD;  Location: [facility] CATH LAB;  Service: Cardiovascular;  Laterality: N/A;  . JOINT REPLACEMENT  06/14/01   right  . LUMBAR FUSION  2006  . MASS EXCISION  11/08/2011   Procedure: EXCISION MASS;  Surgeon: Stacey Klein, MD;  [facility];  Service: General;  Laterality: Left;  Excision Left Thigh Mass  . MASTECTOMY W/ SENTINEL NODE BIOPSY Left 06/04/2019   Procedure: LEFT MASTECTOMY WITH SENTINEL LYMPH NODE BIOPSY;  Surgeon: Stacey Klein, MD;  Location: [place];  Service: General;  Laterality: Left;  Marland Kitchen MASTECTOMY, PARTIAL  2008   GOT PACEMAKER AND DEFIB AT THAT TIME  . PACEMAKER INSERTION  04/23/06  . TOTAL KNEE ARTHROPLASTY  05/17/01   RIGHT KNEE  . TOTAL KNEE ARTHROPLASTY Left 11/29/2014   Procedure: TOTAL LEFT KNEE ARTHROPLASTY;  Surgeon: Stacey Cancel, MD;  [facility];  Service: Orthopedics;  Laterality: Left;   Social History:   reports that she has never smoked. She has never used smokeless tobacco. She reports that she does not drink alcohol and does not use drugs.  Family History  Problem Relation Age of Onset  . Hypertension Mother   . Arthritis Mother   . Hypertension Father   . Hypertension Brother   . Hypertension Brother     Medications: Patient's Medications  New Prescriptions   No  medications on file  Previous Medications   ACETAMINOPHEN (TYLENOL) 500 MG TABLET    Take 500 mg by mouth as needed.   ACETAMINOPHEN (TYLENOL) 650 MG CR TABLET    Take 650 mg by mouth every 8 (eight) hours as needed for pain.   AMLODIPINE (NORVASC) 5 MG TABLET    TAKE 1 TABLET BY MOUTH DAILY.   ASCORBIC ACID (VITAMIN C) 500 MG TABLET    Take 500 mg by mouth daily.   BUPROPION (WELLBUTRIN XL) 150 MG 24 HR TABLET    Take 450 mg by mouth daily.   CARVEDILOL (COREG) 3.125 MG TABLET    TAKE 1 TABLET BY MOUTH  TWICE DAILY   CHOLECALCIFEROL (VITAMIN D3) 50 MCG (2000 UT) TABS    Take 2,000 Units by mouth daily.    FLUTICASONE (FLONASE) 50 MCG/ACT NASAL SPRAY    Place 2 sprays into both nostrils daily.   FUROSEMIDE (LASIX) 20 MG TABLET    TAKE 1 TABLET BY MOUTH  DAILY   LEVOTHYROXINE (SYNTHROID, LEVOTHROID) 100 MCG TABLET    Take 1 tablet (100 mcg total) by mouth daily before breakfast.   LIDOCAINE 4 % PTCH    Apply 1 patch topically every 12 (twelve) hours. Apply to back   MAGNESIUM 250 MG TABS    Take 250 mg by mouth daily.   MECLIZINE (ANTIVERT) 12.5 MG TABLET    Take 1 tablet (12.5 mg total) by mouth 3 (three) times daily as needed for dizziness.   METHOCARBAMOL (ROBAXIN) 500 MG TABLET    Take 500 mg by mouth every 8 (eight) hours as needed for muscle pain.   MIRABEGRON ER (MYRBETRIQ) 50 MG TB24 TABLET    Take 1 tablet (50 mg total) by mouth daily.   NONFORMULARY OR COMPOUNDED ITEM    Apply 120 Tubes topically daily. Diclofenac/Cyclobenzaprine/lamotrigine/lidocaine/prilocaine (10480) 2%/2%/6%/5%/1.25% cream QTY: 120 GM SIG: (NEURO) APPLY 1-2 PUMPS (1-2 GMS) TO AFFECTED AREA (S) OR FOCAL POINTS 3 TO 4 TIMES DAILY. RUB IN FOR 2 MINUTES TO ACHIEVE MAX PENETRATION. [place] HANDS WELL.   OMEGA-3 FATTY ACIDS (FISH OIL PO)    Take 1 capsule by mouth daily.   OMEPRAZOLE (PRILOSEC) 40 MG CAPSULE    TAKE 1 CAPSULE BY MOUTH  ONCE DAILY FOR STOMACH   OXCARBAZEPINE (TRILEPTAL) 300 MG TABLET    Take 300 mg by mouth  daily.    POLYETHYLENE GLYCOL POWDER (GLYCOLAX/MIRALAX) 17 GM/SCOOP POWDER    Take 1 Container by mouth as needed.   POTASSIUM CHLORIDE (KLOR-CON) 10 MEQ TABLET    TAKE 1 TABLET BY MOUTH  DAILY   SENNA-DOCUSATE (SENOKOT S) 8.6-50 MG TABLET    Take 2 tablets by mouth daily.   SERTRALINE (ZOLOFT) 100 MG TABLET    Take 200 mg by mouth daily.    SPIRONOLACTONE (ALDACTONE) 25 MG TABLET    TAKE 1 TABLET BY MOUTH IN  THE MORNING   TIZANIDINE (ZANAFLEX) 2 MG TABLET    Take 1 tablet (2 mg total) by mouth daily as needed for muscle spasms.   VITAMIN E 400 UNITS TABS    Take 400 Units by mouth daily.   Modified Medications   No medications on file  Discontinued Medications   No medications on file    Physical Exam:  Vitals:   07/15/20 0930  BP: 116/66  Pulse: 72  Temp: (!) 96.6 F (35.9 C)  TempSrc: Temporal  SpO2: 97%  Weight: 117 lb (53.1 kg)  Height: 4\' 11"  (1.499 m)   Body mass index is 23.63 kg/m. Wt Readings from Last 3 Encounters:  07/15/20 117 lb (53.1 kg)  06/21/20 120 lb 12.8 oz (54.8 kg)  06/20/20 119 lb 14.4 oz (54.4 kg)    Physical Exam Constitutional:      General: She is not in acute distress.    Appearance: Normal appearance. She is not diaphoretic.  HENT:     Head: Normocephalic and atraumatic.     Mouth/Throat:     Pharynx: No oropharyngeal exudate.  Eyes:     Conjunctiva/sclera: Conjunctivae normal.     Pupils: Pupils are equal, round, and reactive to light.  Cardiovascular:     Rate and Rhythm: Normal rate and regular rhythm.     Heart sounds: Normal heart sounds.  Pulmonary:     Effort: Pulmonary effort is normal.     Breath sounds: Normal breath sounds.  Abdominal:     General: Bowel sounds are normal.     Palpations: Abdomen is soft.  Musculoskeletal:  General: No swelling or tenderness. Normal range of motion.  Skin:    General: Skin is warm and dry.  Neurological:     Mental Status: She is alert and oriented to person, place, and time.   Psychiatric:        Mood and Affect: Mood is depressed.     Labs reviewed: Basic Metabolic Panel: Recent Labs    07/17/19 1621 11/06/19 1523 12/23/19 0859 01/28/20 0829 06/20/20 0944  NA 140   < > 139 141 140  K 4.5   < > 4.3 4.5 4.7  CL 106   < > 104 102 104  CO2 27   < > 29 33* 29  GLUCOSE 100   < > 94 91 98  BUN 31*   < > 26* 31* 16  CREATININE 0.83   < > 0.91 0.95* 0.98  CALCIUM 10.2   < > 10.1 10.5* 10.1  TSH 1.52  --   --   --   --    < > = values in this interval not displayed.   Liver Function Tests: Recent Labs    12/23/19 0859 01/28/20 0829 06/20/20 0944  AST 26 26 26   ALT 24 27 26   ALKPHOS 69  --  68  BILITOT 0.3 0.4 0.3  PROT 7.6 7.2 7.8  ALBUMIN 3.7  --  4.0   No results for input(s): LIPASE, AMYLASE in the last 8760 hours. No results for input(s): AMMONIA in the last 8760 hours. CBC: Recent Labs    01/12/20 1604 01/28/20 0829 06/20/20 0944  WBC 9.1 8.1 7.8  NEUTROABS 5,915 5,095 5.2  HGB 12.7 13.6 13.7  HCT 38.0 41.2 41.2  MCV 85.4 86.2 86.0  PLT 129* 120* 124*   Lipid Panel: No results for input(s): CHOL, HDL, LDLCALC, TRIG, CHOLHDL, LDLDIRECT in the last 8760 hours. TSH: Recent Labs    07/17/19 1621  TSH 1.52   A1C: No results found for: HGBA1C   Assessment/Plan 1. Dysphagia, unspecified type Coughing episodes every other day when eating or drinking. Concerned for aspiration. Will order swallow study - SLP modified barium swallow; Future  2. Dementia without behavioral disturbance, unspecified dementia type [facility]) Lives with daughter at this time, progressive decline. Wants another option from a neurologist. She is unable to tolerate aricept, namenda, exelon due to side effects.  - Ambulatory referral to Neurology  3. Anxiety and depression Followed by a psychiatrist yearly. Currently taking Wellbutrin 450 mg and zoloft 200 mg daily. Has been on this dose for many years. Will attempt GDR and decrease Wellbutrin to 300 mg  daily to see if high dose is contributing to dizziness. Educated to follow up if depression or anxiety becomes worse.   4. Bipolar depression ([place]) Followed by psychiatrist yearly. Will decrease Wellbutrin to 300 mg daily to see if this is contributing to dizziness and if lower dose effective in treatment.. Educated to follow up if mood worsens/episodes occur.  - buPROPion (WELLBUTRIN XL) 150 MG 24 hr tablet; Take 2 tablets (300 mg total) by mouth daily.  5. Overactive bladder Stable on myrebitriq 50 mg daily- did not see improvement on myrbetriq 25 mg daily   6. Chronic idiopathic constipation Continue senokot and miralax   7. Age-related osteoporosis without current pathological fracture Stable, continues on prolia injection with calcium and vitamin D  8. Essential hypertension Controlled at this time on coreg, lasix, norvasc, and aldactone. Will decrease norvasc to 2.5 mg daily and encourage use of compression stockings  due to bp being on the lower side will increase in dizziness.    Next appt: 1 month I personally was present during the history, physical exam and medical decision-making activities of this service and have verified that the service and findings are accurately documented in the student's note  Jeremy Mclamb K. [place], [facility] [phone]

## 2020-07-15 NOTE — Addendum Note (Signed)
Addended by: [name] on: 07/15/2020 01:20 PM   Modules accepted: Orders

## 2020-07-15 NOTE — Patient Instructions (Addendum)
Decrease amlodipine (norvasc) 2.5 mg daily  To wear compression hose(socks) daily  Decrease Wellbutrin to 300 mg by mouth daily

## 2020-07-18 ENCOUNTER — Other Ambulatory Visit (HOSPITAL_COMMUNITY): Payer: Self-pay

## 2020-07-18 DIAGNOSIS — R1310 Dysphagia, unspecified: Secondary | ICD-10-CM

## 2020-07-20 ENCOUNTER — Encounter: Payer: Self-pay | Admitting: Neurology

## 2020-07-21 ENCOUNTER — Other Ambulatory Visit: Payer: Self-pay

## 2020-07-21 DIAGNOSIS — M545 Low back pain, unspecified: Secondary | ICD-10-CM

## 2020-07-21 DIAGNOSIS — M79605 Pain in left leg: Secondary | ICD-10-CM

## 2020-07-21 MED ORDER — METHOCARBAMOL 500 MG PO TABS: 500.0000 mg | ORAL_TABLET | Freq: Three times a day (TID) | ORAL | 1 refills | Status: DC | PRN

## 2020-07-21 NOTE — Telephone Encounter (Signed)
Patient called requesting refill on robaxin   Request sent to [name], NP to confirm dispense number and to advise if ok to provider additional refills

## 2020-07-27 ENCOUNTER — Ambulatory Visit (HOSPITAL_COMMUNITY)
Admission: RE | Admit: 2020-07-27 | Discharge: 2020-07-27 | Disposition: A | Discharge status: Home / Self Care | Payer: Medicare Other | Source: Ambulatory Visit | Attending: Nurse Practitioner | Admitting: Nurse Practitioner

## 2020-07-27 ENCOUNTER — Other Ambulatory Visit: Payer: Self-pay

## 2020-07-27 DIAGNOSIS — R1310 Dysphagia, unspecified: Secondary | ICD-10-CM

## 2020-07-27 NOTE — Progress Notes (Incomplete)
Objective Swallowing Evaluation: Type of Study: MBS-Modified Barium Swallow Study   Patient Details  Name: [name] MRN: [id number] Date of Birth: 09-18-1933  Today's Date: 07/27/2020 Time: SLP Start Time (ACUTE ONLY): 33 -SLP Stop Time (ACUTE ONLY): 1350  SLP Time Calculation (min) (ACUTE ONLY): 35 min   Past Medical History:  Past Medical History:  Diagnosis Date   Arthritis    Cancer ([place])    left breast cancer    Cataracts, bilateral    removed by surgery   CHF (congestive heart failure) ([place])    PACEMAKER & DEFIB   Complication of anesthesia    hypotensive after back surgery in 2006   Depression    Dyslipidemia    Fainted 04/21/06   AT [facility]   GERD (gastroesophageal reflux disease)    Headache(784.0)    Hearing loss    bilateral hearing aids   HLD (hyperlipidemia)    diet controlled    Hypertension    Hypothyroidism    ICD (implantable cardiac defibrillator) in place    pt has pacer/icd   ICD (implantable cardiac defibrillator), biventricular, in situ    LBBB (left bundle branch block)    Memory loss    Nonischemic cardiomyopathy ([place])    Normal coronary arteries    s/p cardiac cath 2007   Pacemaker    ICD [employer]   Syncope    Systolic CHF [facility])    Vertigo    Wears glasses    Past Surgical History:  Past Surgical History:  Procedure Laterality Date   BACK SURGERY     lumbar fusion    BREAST LUMPECTOMY Left 2008   BREAST SURGERY  2000   LUMP REMOVAL. STAGE 1 CANCER   CARDIAC CATHETERIZATION     CATARACT EXTRACTION     COLONOSCOPY     EYE SURGERY     IMPLANTABLE CARDIOVERTER DEFIBRILLATOR GENERATOR CHANGE N/A 12/18/2012   Procedure: IMPLANTABLE CARDIOVERTER DEFIBRILLATOR GENERATOR CHANGE;  Surgeon: [name], MD;  Location: [facility] CATH LAB;  Service: Cardiovascular;  Laterality: N/A;   JOINT REPLACEMENT  06/14/01   right   LUMBAR FUSION  2006   MASS EXCISION  11/08/2011   Procedure: EXCISION MASS;  Surgeon: [name], MD;   Location: [facility];  Service: General;  Laterality: Left;  Excision Left Thigh Mass   MASTECTOMY W/ SENTINEL NODE BIOPSY Left 06/04/2019   Procedure: LEFT MASTECTOMY WITH SENTINEL LYMPH NODE BIOPSY;  Surgeon: [name], MD;  Location: [facility];  Service: General;  Laterality: Left;   MASTECTOMY, PARTIAL  2008   GOT PACEMAKER AND DEFIB AT THAT TIME   PACEMAKER INSERTION  04/23/06   TOTAL KNEE ARTHROPLASTY  05/17/01   RIGHT KNEE   TOTAL KNEE ARTHROPLASTY Left 11/29/2014   Procedure: TOTAL LEFT KNEE ARTHROPLASTY;  Surgeon: Paralee Cancel, MD;  Location: [facility];  Service: Orthopedics;  Laterality: Left;   HPI: pt is an 87 yo female referred by MS Eubanks NP for OP MBS. Pt with PMH + for dementia, oral candida, CHF, breast cancer - left, cardiomyopathy, depression, GERD, HA, vertigo, numbness and tingling of right hand, constipation.  Medication list includes omeprazole - and per office notes, pt has been coughing with all po intake.  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 Daughter and pt report pt has been coughing with intake over the last several months.  Pt admits to coughing more with  liquid than foods.  She has not required heimlich manuever, having pneumonias but admits to some weight loss (20 pounds over one year but family reports 7 pounds in two weeks).  Daughter states pt coughs with intake - but not everyday.  Pt denies exacerbating factors but does state hoarseness is worse in the am or pm.   No data recorded   Assessment / Plan / Recommendation  CHL IP CLINICAL IMPRESSIONS 07/27/2020  Clinical Impression Pt with functional oropharyngeal swallow and minimal pharyngoesophageal dysphagia without aspiration or penetration of any consistency tested.  Pt was tested with thin, nectar, pudding, cracker and tablet.  Although pt appears with cervical osteophytes C3-C4, C5-C6, C6-C7 did not impair  epiglottic deflection nor barium flow.   Swallow was strong and timely without retention.  Unfortunately pt did not cough during entire procedure but did sense tablet lodging in left side of throat - when it had cleared.  Pt takes pills with liquids - extending her head upward.  Decreased CP opening allowed tablet temporarily to halt just below CP and was backflowed above UES without pt sensation.  Pt independent consumption of liquids without cues facilitated clearance. A-P view completed due to h/o pharyngeal diverticulum - minimal ability to view but did not appear to cause any dysphagia.  Recommended pt take pills with pudding - start and follow with liquids.  Thanks for this consult.  SLP Visit Diagnosis Dysphagia, pharyngoesophageal phase (R13.14)  Attention and concentration deficit following --  Frontal lobe and executive function deficit following --  Impact on safety and function Mild aspiration risk      No flowsheet data found.   No flowsheet data found.  CHL IP DIET RECOMMENDATION 07/27/2020  SLP Diet Recommendations Regular solids;Thin liquid  Liquid Administration via Cup;Straw  Medication Administration Whole meds with puree  Compensations Slow rate;Small sips/bites  Postural Changes Seated upright at 90 degrees;Remain semi-upright after after feeds/meals (Comment)      No flowsheet data found.    No flowsheet data found.    No flowsheet data found.         CHL IP ORAL PHASE 07/27/2020  Oral Phase WFL  Oral - Pudding Teaspoon --  Oral - Pudding Cup --  Oral - Honey Teaspoon --  Oral - Honey Cup --  Oral - Nectar Teaspoon --  Oral - Nectar Cup --  Oral - Nectar Straw --  Oral - Thin Teaspoon --  Oral - Thin Cup --  Oral - Thin Straw --  Oral - Puree --  Oral - Mech Soft --  Oral - Regular --  Oral - Multi-Consistency --  Oral - Pill --  Oral Phase - Comment --    CHL IP PHARYNGEAL PHASE 07/27/2020  Pharyngeal Phase WFL  Pharyngeal- Pudding Teaspoon --   Pharyngeal --  Pharyngeal- Pudding Cup --  Pharyngeal --  Pharyngeal- Honey Teaspoon --  Pharyngeal --  Pharyngeal- Honey Cup --  Pharyngeal --  Pharyngeal- Nectar Teaspoon --  Pharyngeal --  Pharyngeal- Nectar Cup --  Pharyngeal --  Pharyngeal- Nectar Straw --  Pharyngeal --  Pharyngeal- Thin Teaspoon --  Pharyngeal --  Pharyngeal- Thin Cup --  Pharyngeal --  Pharyngeal- Thin Straw --  Pharyngeal --  Pharyngeal- Puree --  Pharyngeal --  Pharyngeal- Mechanical Soft --  Pharyngeal --  Pharyngeal- Regular --  Pharyngeal --  Pharyngeal- Multi-consistency --  Pharyngeal --  Pharyngeal- Pill --  Pharyngeal --  Pharyngeal Comment --     CHL  IP CERVICAL ESOPHAGEAL PHASE 07/27/2020  Cervical Esophageal Phase Impaired  Pudding Teaspoon --  Pudding Cup --  Honey Teaspoon --  Honey Cup --  Nectar Teaspoon --  Nectar Cup --  Nectar Straw WFL  Thin Teaspoon --  Thin Cup WFL  Thin Straw WFL  Puree WFL  Mechanical Soft WFL  Regular --  Multi-consistency --  Pill Reduced cricopharyngeal relaxation;Esophageal backflow into the pharynx  Cervical Esophageal Comment Barium tablet temporarily halted at below CP and then was backflowed above it without pt sensation.  Pt consumption of liquids without cues facilitated clearance.     Macario Golds 07/27/2020, 2:11 PM    Kathleen Lime, MS [facility] SLP Acute Rehab Services Office [phone] Pager [phone]

## 2020-08-04 ENCOUNTER — Ambulatory Visit (INDEPENDENT_AMBULATORY_CARE_PROVIDER_SITE_OTHER): Payer: Medicare Other

## 2020-08-04 DIAGNOSIS — I428 Other cardiomyopathies: Secondary | ICD-10-CM

## 2020-08-04 LAB — CUP PACEART REMOTE DEVICE CHECK
Battery Remaining Longevity: 36 mo
Battery Remaining Percentage: 55 %
Brady Statistic RA Percent Paced: 0 %
Brady Statistic RV Percent Paced: 100 %
Date Time Interrogation Session: 20220428080600
HighPow Impedance: 48 Ohm
Implantable Lead Implant Date: 20080116
Implantable Lead Implant Date: 20080116
Implantable Lead Implant Date: 20080116
Implantable Lead Location: 753859
Implantable Lead Location: 753860
Implantable Lead Location: 753860
Implantable Lead Model: 157
Implantable Lead Model: 4469
Implantable Lead Model: 4555
Implantable Lead Serial Number: 136532
Implantable Lead Serial Number: 161542
Implantable Lead Serial Number: 473495
Implantable Pulse Generator Implant Date: 20140911
Lead Channel Impedance Value: 397 Ohm
Lead Channel Impedance Value: 535 Ohm
Lead Channel Impedance Value: 952 Ohm
Lead Channel Pacing Threshold Amplitude: 0.5 V
Lead Channel Pacing Threshold Amplitude: 0.6 V
Lead Channel Pacing Threshold Amplitude: 0.6 V
Lead Channel Pacing Threshold Pulse Width: 0.4 ms
Lead Channel Pacing Threshold Pulse Width: 0.4 ms
Lead Channel Pacing Threshold Pulse Width: 0.8 ms
Lead Channel Setting Pacing Amplitude: 2 V
Lead Channel Setting Pacing Amplitude: 2 V
Lead Channel Setting Pacing Amplitude: 2.4 V
Lead Channel Setting Pacing Pulse Width: 0.4 ms
Lead Channel Setting Pacing Pulse Width: 0.8 ms
Lead Channel Setting Sensing Sensitivity: 0.6 mV
Lead Channel Setting Sensing Sensitivity: 1 mV
Pulse Gen Serial Number: 111765

## 2020-08-12 ENCOUNTER — Ambulatory Visit (INDEPENDENT_AMBULATORY_CARE_PROVIDER_SITE_OTHER): Payer: Medicare Other | Admitting: Nurse Practitioner

## 2020-08-12 ENCOUNTER — Encounter: Payer: Self-pay | Admitting: Nurse Practitioner

## 2020-08-12 ENCOUNTER — Other Ambulatory Visit: Payer: Self-pay

## 2020-08-12 VITALS — BP 120/70 | HR 69 | Temp 97.3°F | Wt 117.0 lb

## 2020-08-12 DIAGNOSIS — D692 Other nonthrombocytopenic purpura: Secondary | ICD-10-CM

## 2020-08-12 DIAGNOSIS — F0390 Unspecified dementia without behavioral disturbance: Secondary | ICD-10-CM

## 2020-08-12 DIAGNOSIS — R1310 Dysphagia, unspecified: Secondary | ICD-10-CM

## 2020-08-12 DIAGNOSIS — I10 Essential (primary) hypertension: Secondary | ICD-10-CM

## 2020-08-12 DIAGNOSIS — F319 Bipolar disorder, unspecified: Secondary | ICD-10-CM

## 2020-08-12 DIAGNOSIS — N3281 Overactive bladder: Secondary | ICD-10-CM

## 2020-08-12 DIAGNOSIS — I70213 Atherosclerosis of native arteries of extremities with intermittent claudication, bilateral legs: Secondary | ICD-10-CM

## 2020-08-12 MED ORDER — MIRABEGRON ER 50 MG PO TB24: 50.0000 mg | ORAL_TABLET | Freq: Every day | ORAL | 3 refills | Status: DC

## 2020-08-12 MED ORDER — BUPROPION HCL ER (XL) 150 MG PO TB24: 150.0000 mg | ORAL_TABLET | Freq: Every day | ORAL | Status: AC

## 2020-08-12 NOTE — Progress Notes (Signed)
'; Careteam: Patient Care Team: [name], NP as PCP - General (Geriatric Medicine) [name], MD as Consulting Physician (General Surgery) Paralee Cancel, MD as Consulting Physician (Orthopedic Surgery) Marica Otter, [place] (Optometry) Marcial Pacas, MD as Consulting Physician (Neurology) [name], MD as Consulting Physician (Cardiology) Ricard Dillon, MD (Psychiatry)  PLACE OF SERVICE:  [place] Directive information Does Patient Have a Medical Advance Directive?: Yes, Type of Advance Directive: Living will;Out of facility DNR (pink MOST or yellow form), Pre-existing out of facility DNR order (yellow form or pink MOST form): Yellow form placed in chart (order not valid for inpatient use), Does patient want to make changes to medical advance directive?: No - Patient declined  Allergies  Allergen Reactions  . Iodine Shortness Of Breath    Iodine contrast, CHF , SOB  . Shellfish Allergy Shortness Of Breath  . Memantine     Malaise, fogginess/ couldn't think  . Aspirin Nausea And Vomiting  . Codeine Nausea And Vomiting    Chief Complaint  Patient presents with  . Medical Management of Chronic Issues    4 week follow up.      HPI: Patient is a 87 y.o. female for follow up dizziness and others  Had swallow evaluation and has made some modifications- there was no coughing during evaluation. Overall coughing less when swallowing.   Due to persistent worsening dizziness GDR of Wellbutrin started.  She has decrease Wellbutrin to 300 mg from 450 mg and has not noticed any decrease or changes in mood.  2 years ago she tried to cut back on zoloft and depression worsened. Worried about worsening depression when cutting back medication   htn- cut back norvasc to 2.5 mg daily which she has tolerated well.   Wearing compression hose- helping with swelling but does not like them.   Reports overall dizziness is better  Neurology referral is  scheduled in July for evaluation of memory.   When she wakes up in the morning having a lot of pain.  Taking tylenol throughout the day but does not notice any difference.    Review of Systems:  Review of Systems  Constitutional: Negative for chills, fever and weight loss.  Respiratory: Negative for cough, sputum production and shortness of breath.   Cardiovascular: Negative for chest pain, palpitations and leg swelling.  Gastrointestinal: Negative for abdominal pain, constipation, diarrhea and heartburn.  Genitourinary: Negative for dysuria, frequency and urgency.  Skin: Negative.   Neurological: Positive for dizziness. Negative for headaches.  Psychiatric/Behavioral: Positive for memory loss. Negative for depression. The patient does not have insomnia.     Past Medical History:  Diagnosis Date  . Arthritis   . Cancer [facility])    left breast cancer   . Cataracts, bilateral    removed by surgery  . CHF (congestive heart failure) ([place])    PACEMAKER & DEFIB  . Complication of anesthesia    hypotensive after back surgery in 2006  . Depression   . Dyslipidemia   . Fainted 04/21/06   AT [place]  . GERD (gastroesophageal reflux disease)   . Headache(784.0)   . Hearing loss    bilateral hearing aids  . HLD (hyperlipidemia)    diet controlled   . Hypertension   . Hypothyroidism   . ICD (implantable cardiac defibrillator) in place    pt has pacer/icd  . ICD (implantable cardiac defibrillator), biventricular, in situ   . LBBB (left bundle branch block)   .  Memory loss   . Nonischemic cardiomyopathy ([place])   . Normal coronary arteries    s/p cardiac cath 2007  . Pacemaker    ICD [employer]  . Syncope   . Systolic CHF ([place])   . Vertigo   . Wears glasses    Past Surgical History:  Procedure Laterality Date  . BACK SURGERY     lumbar fusion   . BREAST LUMPECTOMY Left 2008  . BREAST SURGERY  2000   LUMP REMOVAL. STAGE 1 CANCER  . CARDIAC CATHETERIZATION    . CATARACT  EXTRACTION    . COLONOSCOPY    . EYE SURGERY    . IMPLANTABLE CARDIOVERTER DEFIBRILLATOR GENERATOR CHANGE N/A 12/18/2012   Procedure: IMPLANTABLE CARDIOVERTER DEFIBRILLATOR GENERATOR CHANGE;  Surgeon: [name], MD;  [facility] C[facility] LAB;  Service: Cardiovascular;  Laterality: N/A;  . JOINT REPLACEMENT  06/14/01   right  . LUMBAR FUSION  2006  . MASS EXCISION  11/08/2011   Procedure: EXCISION MASS;  Surgeon: [name], MD;  Location[facility];  Service: General;  Laterality: Left;  Excision Left Thigh Mass  . MASTECTOMY W/ SENTINEL NODE BIOPSY Left 06/04/2019   Procedure: LEFT MASTECTOMY WITH SENTINEL LYMPH NODE BIOPSY;  Surgeon: [name], MD;  Location: [facility];  Service: General;  Laterality: Left;  [place] MASTECTOMY, PARTIAL  2008   GOT PACEMAKER AND DEFIB AT THAT TIME  . PACEMAKER INSERTION  04/23/06  . TOTAL KNEE ARTHROPLASTY  05/17/01   RIGHT KNEE  . TOTAL KNEE ARTHROPLASTY Left 11/29/2014   Procedure: TOTAL LEFT KNEE ARTHROPLASTY;  Surgeon: Paralee Cancel, MD;  [facility];  Service: Orthopedics;  Laterality: Left;   Social History:   reports that she has never smoked. She has never used smokeless tobacco. She reports that she does not drink alcohol and does not use drugs.  Family History  Problem Relation Age of Onset  . Hypertension Mother   . Arthritis Mother   . Hypertension Father   . Hypertension Brother   . Hypertension Brother     Medications: Patient's Medications  New Prescriptions   No medications on file  Previous Medications   ACETAMINOPHEN (TYLENOL) 500 MG TABLET    Take 500 mg by mouth as needed.   ACETAMINOPHEN (TYLENOL) 650 MG CR TABLET    Take 650 mg by mouth every 8 (eight) hours as needed for pain.   AMLODIPINE (NORVASC) 5 MG TABLET    TAKE 1 TABLET BY MOUTH DAILY.   ASCORBIC ACID (VITAMIN C) 500 MG TABLET    Take 500 mg by mouth daily.   BUPROPION (WELLBUTRIN XL) 150 MG 24 HR TABLET    Take 2 tablets (300 mg total) by mouth daily.   CARVEDILOL  (COREG) 3.125 MG TABLET    TAKE 1 TABLET BY MOUTH  TWICE DAILY   CHOLECALCIFEROL (VITAMIN D3) 50 MCG (2000 UT) TABS    Take 2,000 Units by mouth daily.    FLUTICASONE (FLONASE) 50 MCG/ACT NASAL SPRAY    Place 2 sprays into both nostrils daily.   FUROSEMIDE (LASIX) 20 MG TABLET    TAKE 1 TABLET BY MOUTH  DAILY   LEVOTHYROXINE (SYNTHROID, LEVOTHROID) 100 MCG TABLET    Take 1 tablet (100 mcg total) by mouth daily before breakfast.   LIDOCAINE 4 % PTCH    Apply 1 patch topically every 12 (twelve) hours. Apply to back   MAGNESIUM 250 MG TABS    Take 250 mg by mouth daily.  MECLIZINE (ANTIVERT) 12.5 MG TABLET    Take 1 tablet (12.5 mg total) by mouth 3 (three) times daily as needed for dizziness.   METHOCARBAMOL (ROBAXIN) 500 MG TABLET    Take 1 tablet (500 mg total) by mouth every 8 (eight) hours as needed.   MIRABEGRON ER (MYRBETRIQ) 50 MG TB24 TABLET    Take 1 tablet (50 mg total) by mouth daily.   NONFORMULARY OR COMPOUNDED ITEM    Apply 120 Tubes topically daily. Diclofenac/Cyclobenzaprine/lamotrigine/lidocaine/prilocaine (10480) 2%/2%/6%/5%/1.25% cream QTY: 120 GM SIG: (NEURO) APPLY 1-2 PUMPS (1-2 GMS) TO AFFECTED AREA (S) OR FOCAL POINTS 3 TO 4 TIMES DAILY. RUB IN FOR 2 MINUTES TO ACHIEVE MAX PENETRATION. [place] HANDS WELL.   OMEGA-3 FATTY ACIDS (FISH OIL PO)    Take 1 capsule by mouth daily.   OMEPRAZOLE (PRILOSEC) 40 MG CAPSULE    TAKE 1 CAPSULE BY MOUTH  ONCE DAILY FOR STOMACH   OXCARBAZEPINE (TRILEPTAL) 300 MG TABLET    Take 300 mg by mouth daily.    POLYETHYLENE GLYCOL POWDER (GLYCOLAX/MIRALAX) 17 GM/SCOOP POWDER    Take 1 Container by mouth as needed.   POTASSIUM CHLORIDE (KLOR-CON) 10 MEQ TABLET    TAKE 1 TABLET BY MOUTH  DAILY   SENNA-DOCUSATE (SENOKOT S) 8.6-50 MG TABLET    Take 2 tablets by mouth daily.   SERTRALINE (ZOLOFT) 100 MG TABLET    Take 200 mg by mouth daily.    SPIRONOLACTONE (ALDACTONE) 25 MG TABLET    TAKE 1 TABLET BY MOUTH IN  THE MORNING   VITAMIN E 400 UNITS TABS     Take 400 Units by mouth daily.   Modified Medications   No medications on file  Discontinued Medications   No medications on file    Physical Exam:  Vitals:   08/12/20 1025  BP: 120/70  Pulse: 69  Temp: (!) 97.3 F (36.3 C)  TempSrc: Temporal  SpO2: 97%  Weight: 117 lb (53.1 kg)   Body mass index is 23.63 kg/m. Wt Readings from Last 3 Encounters:  08/12/20 117 lb (53.1 kg)  07/15/20 117 lb (53.1 kg)  06/21/20 120 lb 12.8 oz (54.8 kg)    Physical Exam Constitutional:      General: She is not in acute distress.    Appearance: She is well-developed. She is not diaphoretic.  HENT:     Head: Normocephalic and atraumatic.     Mouth/Throat:     Pharynx: No oropharyngeal exudate.  Eyes:     Conjunctiva/sclera: Conjunctivae normal.     Pupils: Pupils are equal, round, and reactive to light.  Cardiovascular:     Rate and Rhythm: Normal rate and regular rhythm.     Heart sounds: Normal heart sounds.  Pulmonary:     Effort: Pulmonary effort is normal.     Breath sounds: Normal breath sounds.  Abdominal:     General: Bowel sounds are normal.     Palpations: Abdomen is soft.  Musculoskeletal:        General: No tenderness.     Cervical back: Normal range of motion and neck supple.  Skin:    General: Skin is warm and dry.  Neurological:     Mental Status: She is alert and oriented to person, place, and time.  Psychiatric:        Mood and Affect: Mood normal.        Behavior: Behavior normal.     Labs reviewed: Basic Metabolic Panel: Recent Labs    12/23/19  1610 01/28/20 0829 06/20/20 0944  NA 139 141 140  K 4.3 4.5 4.7  CL 104 102 104  CO2 29 33* 29  GLUCOSE 94 91 98  BUN 26* 31* 16  CREATININE 0.91 0.95* 0.98  CALCIUM 10.1 10.5* 10.1   Liver Function Tests: Recent Labs    12/23/19 0859 01/28/20 0829 06/20/20 0944  AST 26 26 26   ALT 24 27 26   ALKPHOS 69  --  68  BILITOT 0.3 0.4 0.3  PROT 7.6 7.2 7.8  ALBUMIN 3.7  --  4.0   No results for  input(s): LIPASE, AMYLASE in the last 8760 hours. No results for input(s): AMMONIA in the last 8760 hours. CBC: Recent Labs    01/12/20 1604 01/28/20 0829 06/20/20 0944  WBC 9.1 8.1 7.8  NEUTROABS 5,915 5,095 5.2  HGB 12.7 13.6 13.7  HCT 38.0 41.2 41.2  MCV 85.4 86.2 86.0  PLT 129* 120* 124*   Lipid Panel: No results for input(s): CHOL, HDL, LDLCALC, TRIG, CHOLHDL, LDLDIRECT in the last 8760 hours. TSH: No results for input(s): TSH in the last 8760 hours. A1C: No results found for: HGBA1C   Assessment/Plan 1. Overactive bladder Stable, will provide refill.  - mirabegron ER (MYRBETRIQ) 50 MG TB24 tablet; Take 1 tablet (50 mg total) by mouth daily.  Dispense: 30 tablet; Refill: 3  2. Dementia without behavioral disturbance, unspecified dementia type ([place]) Stable at this time without acute decline. Daughter helping her with medication and ADLS.   3. Dysphagia, unspecified type Completed MBS, no aspiration seen however dietary modification given and coughing is less at mealtime and with intake of Pos.   4. Bipolar depression (HCC) -stable, she has done well with dose reduction of wellbutrin to 300 mg daily, will try to reduce dose to 150 mg at this time. She continues on zoloft 200 mg with trileptal.  Trileptal could also be contributing to dizziness.  - buPROPion (WELLBUTRIN XL) 150 MG 24 hr tablet; Take 1 tablet (150 mg total) by mouth daily.  5. Essential hypertension Well controlled with dose reduction of norvasc. Will continue current regimen at this time. May be able to DC norvasc in the future   6. Atherosclerosis of native artery of both lower extremities with intermittent claudication (HCC) Stable, without complaints of pain at this time.   7. Senile purpura (HCC) Stable.   Next appt: 3 months, sooner if needed Rosangelica Pevehouse K. [place], [facility] [phone]

## 2020-08-12 NOTE — Patient Instructions (Signed)
3 month follow up- make appt so she can get prolia the same day.   Continue compression hose as you can tolerate  Let me know if there is an increase in depression with reduction of wellbutrin to 150 mg from the 300 mg dose.

## 2020-08-16 ENCOUNTER — Ambulatory Visit (INDEPENDENT_AMBULATORY_CARE_PROVIDER_SITE_OTHER): Payer: Medicare Other | Admitting: Pulmonary Disease

## 2020-08-16 ENCOUNTER — Encounter: Payer: Self-pay | Admitting: Pulmonary Disease

## 2020-08-16 ENCOUNTER — Other Ambulatory Visit: Payer: Self-pay

## 2020-08-16 VITALS — BP 118/80 | HR 70 | Temp 98.1°F | Ht 59.0 in | Wt 117.8 lb

## 2020-08-16 DIAGNOSIS — J849 Interstitial pulmonary disease, unspecified: Secondary | ICD-10-CM

## 2020-08-16 DIAGNOSIS — R9389 Abnormal findings on diagnostic imaging of other specified body structures: Secondary | ICD-10-CM

## 2020-08-16 NOTE — Patient Instructions (Addendum)
We will check a CT chest in early June to monitor the infiltrates seen on the CT of the kidneys. We will call you with the results and schedule follow up if needed at that time.

## 2020-08-16 NOTE — Progress Notes (Signed)
Synopsis: Referred in May 2022 for abnormal CT chest results  Subjective:   PATIENT ID: [name] GENDER: female DOB: [birth year], MRN: [id number]   HPI  Chief Complaint  Patient presents with  . Consult    Referred by PCP for abnormal CT back in March 2022. Per patient, she has not been having any active breathing concerns.    [name] is an 87 year old woman, never smoker with HFrEF, dementia, hypertension and osteoarthritis who is referred to pulmonary clinic for abnormal lung findings on the lower lung fields after CT renal stone study on 06/08/20.  There were bilateral scattered ground glass opacities noted on the CT renal stone study. She denies any shortness of breath or wheezing. She does have an intermittent cough. She reports trouble with her swallowing in which she had barium swallow completed on 07/27/20 that showed mild aspiration risk with minimal pharyngoesophageal dysphagia. She denies recurrent pneumonias.   She does report diffuse joint pains since she was in her 22s. She reports this is osteoarthritis. She does have a history of GERD and is taking omeprazole daily. She denies frequent reflux symptoms and does not have nocturnal symptoms that wake her up from sleep.  Past Medical History:  Diagnosis Date  . Arthritis   . Cancer [facility])    left breast cancer   . Cataracts, bilateral    removed by surgery  . CHF (congestive heart failure) ([place])    PACEMAKER & DEFIB  . Complication of anesthesia    hypotensive after back surgery in 2006  . Depression   . Dyslipidemia   . Fainted 04/21/06   AT [place]  . GERD (gastroesophageal reflux disease)   . Headache(784.0)   . Hearing loss    bilateral hearing aids  . HLD (hyperlipidemia)    diet controlled   . Hypertension   . Hypothyroidism   . ICD (implantable cardiac defibrillator) in place    pt has pacer/icd  . ICD (implantable cardiac defibrillator), biventricular, in situ   . LBBB (left bundle branch  block)   . Memory loss   . Nonischemic cardiomyopathy ([place])   . Normal coronary arteries    s/p cardiac cath 2007  . Pacemaker    ICD [employer]  . Syncope   . Systolic CHF ([place])   . Vertigo   . Wears glasses      Family History  Problem Relation Age of Onset  . Hypertension Mother   . Arthritis Mother   . Hypertension Father   . Hypertension Brother   . Hypertension Brother      Social History   Socioeconomic History  . Marital status: Married    Spouse name: Not on file  . Number of children: 1  . Years of education: Masters  . Highest education level: Not on file  Occupational History  . Occupation: Retired  Tobacco Use  . Smoking status: Never Smoker  . Smokeless tobacco: Never Used  Vaping Use  . Vaping Use: Never used  Substance and Sexual Activity  . Alcohol use: No  . Drug use: No  . Sexual activity: Not Currently    Birth control/protection: Post-menopausal  Other Topics Concern  . Not on file  Social History Narrative   Lives at home with husband.   Right-handed.      As of 07/28/2014   Diet: No special diet   Caffeine: yes, Chocolate, tea and sodas    Married: YES, 1970   House: Yes,  2 stories, 2-3 persons live in home   Pets: No   Current/Past profession: Engineer, mining, Nurse Practitioner    Exercise: Yes 2-3 x weekly   Living Will: Yes   DNR: No   POA/HPOA: No      Social Determinants of Radio broadcast assistant Strain: Not on file  Food Insecurity: Not on file  Transportation Needs: Not on file  Physical Activity: Not on file  Stress: Not on file  Social Connections: Not on file  Intimate Partner Violence: Not on file     Allergies  Allergen Reactions  . Iodine Shortness Of Breath    Iodine contrast, CHF , SOB  . Shellfish Allergy Shortness Of Breath  . Memantine     Malaise, fogginess/ couldn't think  . Aspirin Nausea And Vomiting  . Codeine Nausea And Vomiting     Outpatient Medications Prior to Visit   Medication Sig Dispense Refill  . acetaminophen (TYLENOL) 500 MG tablet Take 500 mg by mouth as needed.    Marland Kitchen acetaminophen (TYLENOL) 650 MG CR tablet Take 650 mg by mouth every 8 (eight) hours as needed for pain.    Marland Kitchen amLODipine (NORVASC) 5 MG tablet TAKE 1 TABLET BY MOUTH DAILY. (Patient taking differently: Take 0.5 tablets by mouth daily.) 90 tablet 1  . ascorbic acid (VITAMIN C) 500 MG tablet Take 500 mg by mouth daily.    Marland Kitchen buPROPion (WELLBUTRIN XL) 150 MG 24 hr tablet Take 1 tablet (150 mg total) by mouth daily.    . carvedilol (COREG) 3.125 MG tablet TAKE 1 TABLET BY MOUTH  TWICE DAILY 180 tablet 3  . Cholecalciferol (VITAMIN D3) 50 MCG (2000 UT) TABS Take 2,000 Units by mouth daily.     . fluticasone (FLONASE) 50 MCG/ACT nasal spray Place 2 sprays into both nostrils daily. 16 g 3  . furosemide (LASIX) 20 MG tablet TAKE 1 TABLET BY MOUTH  DAILY 90 tablet 3  . levothyroxine (SYNTHROID, LEVOTHROID) 100 MCG tablet Take 1 tablet (100 mcg total) by mouth daily before breakfast. 90 tablet 3  . Lidocaine 4 % PTCH Apply 1 patch topically every 12 (twelve) hours. Apply to back    . Magnesium 250 MG TABS Take 250 mg by mouth daily.    . meclizine (ANTIVERT) 12.5 MG tablet Take 1 tablet (12.5 mg total) by mouth 3 (three) times daily as needed for dizziness. 30 tablet 1  . methocarbamol (ROBAXIN) 500 MG tablet Take 1 tablet (500 mg total) by mouth every 8 (eight) hours as needed. 30 tablet 1  . mirabegron ER (MYRBETRIQ) 50 MG TB24 tablet Take 1 tablet (50 mg total) by mouth daily. 30 tablet 3  . NONFORMULARY OR COMPOUNDED ITEM Apply 120 Tubes topically daily. Diclofenac/Cyclobenzaprine/lamotrigine/lidocaine/prilocaine (10480) 2%/2%/6%/5%/1.25% cream QTY: 120 GM SIG: (NEURO) APPLY 1-2 PUMPS (1-2 GMS) TO AFFECTED AREA (S) OR FOCAL POINTS 3 TO 4 TIMES DAILY. RUB IN FOR 2 MINUTES TO ACHIEVE MAX PENETRATION. [place] HANDS WELL. 1 each 2  . Omega-3 Fatty Acids (FISH OIL PO) Take 1 capsule by mouth daily.    Marland Kitchen  omeprazole (PRILOSEC) 40 MG capsule TAKE 1 CAPSULE BY MOUTH  ONCE DAILY FOR STOMACH 90 capsule 3  . Oxcarbazepine (TRILEPTAL) 300 MG tablet Take 300 mg by mouth daily.     . polyethylene glycol powder (GLYCOLAX/MIRALAX) 17 GM/SCOOP powder Take 1 Container by mouth as needed.    . potassium chloride (KLOR-CON) 10 MEQ tablet TAKE 1 TABLET BY MOUTH  DAILY 90 tablet  3  . senna-docusate (SENOKOT S) 8.6-50 MG tablet Take 2 tablets by mouth daily. 60 tablet 5  . sertraline (ZOLOFT) 100 MG tablet Take 200 mg by mouth daily.     Marland Kitchen spironolactone (ALDACTONE) 25 MG tablet TAKE 1 TABLET BY MOUTH IN  THE MORNING 90 tablet 3  . Vitamin E 400 units TABS Take 400 Units by mouth daily.      No facility-administered medications prior to visit.    Review of Systems  Constitutional: Negative for chills, fever, malaise/fatigue and weight loss.  HENT: Negative for congestion, sinus pain and sore throat.   Eyes: Negative.   Respiratory: Negative for cough, hemoptysis, sputum production, shortness of breath and wheezing.   Cardiovascular: Negative for chest pain, palpitations, orthopnea, claudication and leg swelling.  Gastrointestinal: Negative for abdominal pain, heartburn, nausea and vomiting.  Genitourinary: Negative.   Musculoskeletal: Positive for back pain and joint pain. Negative for myalgias.  Skin: Negative for rash.  Neurological: Negative for weakness.  Endo/Heme/Allergies: Negative.   Psychiatric/Behavioral: Negative.       Objective:   Vitals:   08/16/20 1331  BP: 118/80  Pulse: 70  Temp: 98.1 F (36.7 C)  TempSrc: Temporal  SpO2: 98%  Weight: 117 lb 12.8 oz (53.4 kg)  Height: 4\' 11"  (1.499 m)     Physical Exam Constitutional:      General: She is not in acute distress.    Appearance: Normal appearance. She is normal weight. She is not ill-appearing.  HENT:     Head: Normocephalic and atraumatic.  Eyes:     General: No scleral icterus.    Conjunctiva/sclera: Conjunctivae  normal.     Pupils: Pupils are equal, round, and reactive to light.  Cardiovascular:     Rate and Rhythm: Normal rate and regular rhythm.     Pulses: Normal pulses.     Heart sounds: Normal heart sounds. No murmur heard.   Pulmonary:     Effort: Pulmonary effort is normal.     Breath sounds: Normal breath sounds. No wheezing, rhonchi or rales.  Abdominal:     General: Bowel sounds are normal.     Palpations: Abdomen is soft.  Musculoskeletal:     Right lower leg: No edema.     Left lower leg: No edema.  Lymphadenopathy:     Cervical: No cervical adenopathy.  Skin:    General: Skin is warm and dry.  Neurological:     General: No focal deficit present.     Mental Status: She is alert.  Psychiatric:        Mood and Affect: Mood normal.        Behavior: Behavior normal.        Thought Content: Thought content normal.        Judgment: Judgment normal.       CBC    Component Value Date/Time   WBC 7.8 06/20/2020 0944   WBC 8.1 01/28/2020 0829   RBC 4.79 06/20/2020 0944   HGB 13.7 06/20/2020 0944   HGB 12.7 10/05/2015 0945   HGB 12.5 08/24/2011 1332   HCT 41.2 06/20/2020 0944   HCT 39.4 10/05/2015 0945   HCT 37.5 08/24/2011 1332   PLT 124 (L) 06/20/2020 0944   PLT 129 (L) 10/05/2015 0945   MCV 86.0 06/20/2020 0944   MCV 87 10/05/2015 0945   MCV 87.1 08/24/2011 1332   MCH 28.6 06/20/2020 0944   MCHC 33.3 06/20/2020 0944   RDW 15.5 06/20/2020 0944  RDW 15.3 10/05/2015 0945   RDW 15.6 (H) 08/24/2011 1332   LYMPHSABS 1.5 06/20/2020 0944   LYMPHSABS 1.9 10/05/2015 0945   LYMPHSABS 2.5 08/24/2011 1332   MONOABS 0.9 06/20/2020 0944   MONOABS 0.6 08/24/2011 1332   EOSABS 0.2 06/20/2020 0944   EOSABS 0.3 10/05/2015 0945   BASOSABS 0.1 06/20/2020 0944   BASOSABS 0.0 10/05/2015 0945   BASOSABS 0.1 08/24/2011 1332   BMP Latest Ref Rng & Units 06/20/2020 01/28/2020 12/23/2019  Glucose 70 - 99 mg/dL 98 91 94  BUN 8 - 23 mg/dL 16 31(H) 26(H)  Creatinine 0.44 - 1.00 mg/dL  0.98 0.95(H) 0.91  BUN/Creat Ratio 6 - 22 (calc) - 33(H) -  Sodium 135 - 145 mmol/L 140 141 139  Potassium 3.5 - 5.1 mmol/L 4.7 4.5 4.3  Chloride 98 - 111 mmol/L 104 102 104  CO2 22 - 32 mmol/L 29 33(H) 29  Calcium 8.9 - 10.3 mg/dL 10.1 10.5(H) 10.1   Chest imaging: CT Renal Stone Study 06/09/31 Lower chest: Bilateral ground-glass opacities peripherally within both lower lungs. Heart is normal size. Pacer wires noted in the heart. No effusions.  PFT: No flowsheet data found.  Labs:  Path:  Echo 08/26/13: EF 45-50%, mild global hypokinesis. Incoordinate septal motion.  ICD wires noted. Diastolic dysfunction with normal LV filling  pressure.  Assessment & Plan:   Abnormal CT of the chest  Interstitial pulmonary disease ([place]) - Plan: CT Chest High Resolution  Discussion: Kaari Zeigler is an 87 year old woman, never smoker with HFrEF, dementia, hypertension and osteoarthritis who is referred to pulmonary clinic for abnormal lung findings on the lower lung fields after CT renal stone study on 06/08/20.  The ground glass opacities on the recent CT scan are non-specific at this time. She does not report being acutely ill at that time. We will repeat a CT Chest in 1 month to follow up on these infiltrates. The patient is asymptomatic from a respiratory standpoint at this time.   Differential includes infectious or inflammatory etiologies. Inflammatory conditions could be from aspiration or chronic GERD vs autoimmune disease as she has diffuse joint pains. If her CT chest continues to display opacities we will consider sending a serologic workup for autoimmune conditions.  Will schedule follow up based on her CT chest results. If the CT scan is unremarkable, then she does not need further follow up with [place].   Freda Jackson, MD [facility] Office: [phone]   Current Outpatient Medications:  .  acetaminophen (TYLENOL) 500 MG tablet, Take 500 mg by mouth  as needed., Disp: , Rfl:  .  acetaminophen (TYLENOL) 650 MG CR tablet, Take 650 mg by mouth every 8 (eight) hours as needed for pain., Disp: , Rfl:  .  amLODipine (NORVASC) 5 MG tablet, TAKE 1 TABLET BY MOUTH DAILY. (Patient taking differently: Take 0.5 tablets by mouth daily.), Disp: 90 tablet, Rfl: 1 .  ascorbic acid (VITAMIN C) 500 MG tablet, Take 500 mg by mouth daily., Disp: , Rfl:  .  buPROPion (WELLBUTRIN XL) 150 MG 24 hr tablet, Take 1 tablet (150 mg total) by mouth daily., Disp: , Rfl:  .  carvedilol (COREG) 3.125 MG tablet, TAKE 1 TABLET BY MOUTH  TWICE DAILY, Disp: 180 tablet, Rfl: 3 .  Cholecalciferol (VITAMIN D3) 50 MCG (2000 UT) TABS, Take 2,000 Units by mouth daily. , Disp: , Rfl:  .  fluticasone (FLONASE) 50 MCG/ACT nasal spray, Place 2 sprays into both nostrils daily.,  Disp: 16 g, Rfl: 3 .  furosemide (LASIX) 20 MG tablet, TAKE 1 TABLET BY MOUTH  DAILY, Disp: 90 tablet, Rfl: 3 .  levothyroxine (SYNTHROID, LEVOTHROID) 100 MCG tablet, Take 1 tablet (100 mcg total) by mouth daily before breakfast., Disp: 90 tablet, Rfl: 3 .  Lidocaine 4 % PTCH, Apply 1 patch topically every 12 (twelve) hours. Apply to back, Disp: , Rfl:  .  Magnesium 250 MG TABS, Take 250 mg by mouth daily., Disp: , Rfl:  .  meclizine (ANTIVERT) 12.5 MG tablet, Take 1 tablet (12.5 mg total) by mouth 3 (three) times daily as needed for dizziness., Disp: 30 tablet, Rfl: 1 .  methocarbamol (ROBAXIN) 500 MG tablet, Take 1 tablet (500 mg total) by mouth every 8 (eight) hours as needed., Disp: 30 tablet, Rfl: 1 .  mirabegron ER (MYRBETRIQ) 50 MG TB24 tablet, Take 1 tablet (50 mg total) by mouth daily., Disp: 30 tablet, Rfl: 3 .  NONFORMULARY OR COMPOUNDED ITEM, Apply 120 Tubes topically daily. Diclofenac/Cyclobenzaprine/lamotrigine/lidocaine/prilocaine (10480) 2%/2%/6%/5%/1.25% cream QTY: 120 GM SIG: (NEURO) APPLY 1-2 PUMPS (1-2 GMS) TO AFFECTED AREA (S) OR FOCAL POINTS 3 TO 4 TIMES DAILY. RUB IN FOR 2 MINUTES TO ACHIEVE MAX  PENETRATION. [place] HANDS WELL., Disp: 1 each, Rfl: 2 .  Omega-3 Fatty Acids (FISH OIL PO), Take 1 capsule by mouth daily., Disp: , Rfl:  .  omeprazole (PRILOSEC) 40 MG capsule, TAKE 1 CAPSULE BY MOUTH  ONCE DAILY FOR STOMACH, Disp: 90 capsule, Rfl: 3 .  Oxcarbazepine (TRILEPTAL) 300 MG tablet, Take 300 mg by mouth daily. , Disp: , Rfl:  .  polyethylene glycol powder (GLYCOLAX/MIRALAX) 17 GM/SCOOP powder, Take 1 Container by mouth as needed., Disp: , Rfl:  .  potassium chloride (KLOR-CON) 10 MEQ tablet, TAKE 1 TABLET BY MOUTH  DAILY, Disp: 90 tablet, Rfl: 3 .  senna-docusate (SENOKOT S) 8.6-50 MG tablet, Take 2 tablets by mouth daily., Disp: 60 tablet, Rfl: 5 .  sertraline (ZOLOFT) 100 MG tablet, Take 200 mg by mouth daily. , Disp: , Rfl:  .  spironolactone (ALDACTONE) 25 MG tablet, TAKE 1 TABLET BY MOUTH IN  THE MORNING, Disp: 90 tablet, Rfl: 3 .  Vitamin E 400 units TABS, Take 400 Units by mouth daily. , Disp: , Rfl:

## 2020-08-24 NOTE — Progress Notes (Signed)
Remote ICD transmission.   

## 2020-09-09 ENCOUNTER — Other Ambulatory Visit: Payer: Self-pay

## 2020-09-09 ENCOUNTER — Ambulatory Visit
Admission: RE | Admit: 2020-09-09 | Discharge: 2020-09-09 | Disposition: A | Discharge status: Home / Self Care | Payer: Medicare Other | Source: Ambulatory Visit | Attending: Pulmonary Disease | Admitting: Pulmonary Disease

## 2020-09-09 DIAGNOSIS — R9389 Abnormal findings on diagnostic imaging of other specified body structures: Secondary | ICD-10-CM

## 2020-09-09 DIAGNOSIS — J849 Interstitial pulmonary disease, unspecified: Secondary | ICD-10-CM

## 2020-09-16 ENCOUNTER — Telehealth: Payer: Self-pay | Admitting: Pulmonary Disease

## 2020-09-16 DIAGNOSIS — D869 Sarcoidosis, unspecified: Secondary | ICD-10-CM

## 2020-09-16 NOTE — Telephone Encounter (Signed)
Please let the patient know her CT chest scan shows findings that are consistent with possible sarcoidosis. These findings have likely been there for some time, but I would like to have her follow up in 3 months with PFTs to continue to monitor her lung function.   Thanks, Wille Glaser

## 2020-09-18 ENCOUNTER — Emergency Department (HOSPITAL_COMMUNITY): Payer: Medicare Other

## 2020-09-18 ENCOUNTER — Encounter (HOSPITAL_COMMUNITY): Payer: Self-pay | Admitting: Emergency Medicine

## 2020-09-18 ENCOUNTER — Other Ambulatory Visit: Payer: Self-pay

## 2020-09-18 ENCOUNTER — Emergency Department (HOSPITAL_COMMUNITY)
Admission: EM | Admit: 2020-09-18 | Discharge: 2020-09-18 | Disposition: A | Discharge status: Home / Self Care | Payer: Medicare Other | Attending: Emergency Medicine | Admitting: Emergency Medicine

## 2020-09-18 DIAGNOSIS — E039 Hypothyroidism, unspecified: Secondary | ICD-10-CM | POA: Insufficient documentation

## 2020-09-18 DIAGNOSIS — Z950 Presence of cardiac pacemaker: Secondary | ICD-10-CM | POA: Diagnosis not present

## 2020-09-18 DIAGNOSIS — Z853 Personal history of malignant neoplasm of breast: Secondary | ICD-10-CM | POA: Insufficient documentation

## 2020-09-18 DIAGNOSIS — Z79899 Other long term (current) drug therapy: Secondary | ICD-10-CM | POA: Diagnosis not present

## 2020-09-18 DIAGNOSIS — R1030 Lower abdominal pain, unspecified: Secondary | ICD-10-CM | POA: Insufficient documentation

## 2020-09-18 DIAGNOSIS — F0390 Unspecified dementia without behavioral disturbance: Secondary | ICD-10-CM | POA: Diagnosis not present

## 2020-09-18 DIAGNOSIS — I5022 Chronic systolic (congestive) heart failure: Secondary | ICD-10-CM | POA: Diagnosis not present

## 2020-09-18 DIAGNOSIS — I110 Hypertensive heart disease with heart failure: Secondary | ICD-10-CM | POA: Insufficient documentation

## 2020-09-18 DIAGNOSIS — Z96653 Presence of artificial knee joint, bilateral: Secondary | ICD-10-CM | POA: Diagnosis not present

## 2020-09-18 DIAGNOSIS — M545 Low back pain, unspecified: Secondary | ICD-10-CM | POA: Diagnosis not present

## 2020-09-18 LAB — COMPREHENSIVE METABOLIC PANEL
ALT: 22 U/L (ref 0–44)
AST: 26 U/L (ref 15–41)
Albumin: 4 g/dL (ref 3.5–5.0)
Alkaline Phosphatase: 48 U/L (ref 38–126)
Anion gap: 9 (ref 5–15)
BUN: 21 mg/dL (ref 8–23)
CO2: 27 mmol/L (ref 22–32)
Calcium: 9.9 mg/dL (ref 8.9–10.3)
Chloride: 103 mmol/L (ref 98–111)
Creatinine, Ser: 0.64 mg/dL (ref 0.44–1.00)
GFR, Estimated: >60 mL/min (ref >60)
Glucose, Bld: 96 mg/dL (ref 70–99)
Potassium: 3.8 mmol/L (ref 3.5–5.1)
Sodium: 139 mmol/L (ref 135–145)
Total Bilirubin: 0.4 mg/dL (ref 0.3–1.2)
Total Protein: 7.1 g/dL (ref 6.5–8.1)

## 2020-09-18 LAB — URINALYSIS, ROUTINE W REFLEX MICROSCOPIC
Bilirubin Urine: NEGATIVE
Glucose, UA: NEGATIVE mg/dL
Hgb urine dipstick: NEGATIVE
Ketones, ur: NEGATIVE mg/dL
Leukocytes,Ua: NEGATIVE
Nitrite: NEGATIVE
Protein, ur: NEGATIVE mg/dL
Specific Gravity, Urine: 1.016 (ref 1.005–1.030)
pH: 7 (ref 5.0–8.0)

## 2020-09-18 LAB — CBC WITH DIFFERENTIAL/PLATELET
Abs Immature Granulocytes: 0.03 10*3/uL (ref 0.00–0.07)
Basophils Absolute: 0.1 10*3/uL (ref 0.0–0.1)
Basophils Relative: 1 %
Eosinophils Absolute: 0.2 10*3/uL (ref 0.0–0.5)
Eosinophils Relative: 2 %
HCT: 37.9 % (ref 36.0–46.0)
Hemoglobin: 12.5 g/dL (ref 12.0–15.0)
Immature Granulocytes: 0 %
Lymphocytes Relative: 23 %
Lymphs Abs: 1.9 10*3/uL (ref 0.7–4.0)
MCH: 28.8 pg (ref 26.0–34.0)
MCHC: 33 g/dL (ref 30.0–36.0)
MCV: 87.3 fL (ref 80.0–100.0)
Monocytes Absolute: 0.7 10*3/uL (ref 0.1–1.0)
Monocytes Relative: 9 %
Neutro Abs: 5.2 10*3/uL (ref 1.7–7.7)
Neutrophils Relative %: 65 %
Platelets: 125 10*3/uL — ABNORMAL LOW (ref 150–400)
RBC: 4.34 MIL/uL (ref 3.87–5.11)
RDW: 15.5 % (ref 11.5–15.5)
WBC: 8 10*3/uL (ref 4.0–10.5)
nRBC: 0 % (ref 0.0–0.2)

## 2020-09-18 MED ORDER — PREDNISONE 10 MG PO TABS: ORAL_TABLET | ORAL | 0 refills | Status: AC

## 2020-09-18 MED ORDER — FENTANYL CITRATE (PF) 100 MCG/2ML IJ SOLN
25.0000 ug | Freq: Once | INTRAMUSCULAR | Status: AC
  Administered 2020-09-18: 25 ug via INTRAVENOUS
  Filled 2020-09-18: qty 2

## 2020-09-18 MED ORDER — DICLOFENAC SODIUM 1 % EX GEL: 4.0000 g | Freq: Four times a day (QID) | CUTANEOUS | 2 refills | Status: DC

## 2020-09-18 NOTE — ED Triage Notes (Signed)
[facility] transported pt from home and reports the following.  Pt reporting right side lumbar back pain starting on Friday and it is progressively worsening. Pt describes pain as transient, increasing with with movement, and radiating to right hip and right stomach.

## 2020-09-18 NOTE — Discharge Instructions (Addendum)
You were evaluated in the Emergency Department and after careful evaluation, we did not find any emergent condition requiring admission or further testing in the hospital.  You may use topical Voltaren gel which can be found at your local pharmacy.  Please follow-up with your primary care doctor this week.  Please return to the Emergency Department if you experience any worsening of your condition. Thank you for allowing [place] to be a part of your care.  Ibuprofen: Take 400 mg of ibuprofen every 8 hours for the next 3 days. After this time, this medication may be used as needed for pain. Take this medication with food to avoid upset stomach. Acetaminophen (generic for Tylenol): Should you continue to have additional pain while taking the ibuprofen or naproxen, you may add in acetaminophen as needed. Your daily total maximum amount of acetaminophen from all sources should be limited to 4000mg /day for persons without liver problems, or 2000mg /day for those with liver problems. Diclofenac gel: This is a topical anti-inflammatory medication and can be applied directly to the painful region.  Do not use on the face or genitals.  This medication may be used as an alternative to oral anti-inflammatory medications, such as ibuprofen or naproxen. Prednisone: Take the prednisone, as prescribed, until finished. If you are a diabetic, please know prednisone can raise your blood sugar temporarily. Lidocaine patches: These are available via either prescription or over-the-counter. The over-the-counter option may be more economical one and are likely just as effective. There are multiple over-the-counter brands, such as Salonpas. Ice: May apply ice to the area over the next 24 hours for 15 minutes at a time to reduce pain, inflammation, and swelling, if present. Exercises: Be sure to perform the attached exercises starting with three times a week and working up to performing them daily. This is an essential part of  preventing long term problems.  Follow up: Follow up with a primary care provider for any future management of these complaints. Be sure to follow up within 7-10 days. Return: Return to the ED should symptoms worsen.  For prescription assistance, may try using prescription discount sites or apps, such as goodrx.com

## 2020-09-18 NOTE — ED Provider Notes (Signed)
   After previous PA left, RN notifies me patient is requesting medication for home.  I reviewed provider note and went to speak with the patient.  I had a long discussion with the patient and her daughter at bedside and options for management of the patient's pain were discussed. We came to an agreement on a regimen for the patient's pain as well as plan for follow-up.      Lorayne Bender, PA-C 09/19/20 0009    Tegeler, Gwenyth Allegra, MD 09/19/20 [phone]

## 2020-09-18 NOTE — ED Provider Notes (Signed)
[facility] DEPT Provider Note   CSN: [id number] Arrival date & time: 09/18/20  7628     History Chief Complaint  Patient presents with   Back Pain    [name] is a 87 y.o. female.  HPI 87 year old female with a history of left breast cancer, CHF, depression, GERD, headaches, hypertension, hypothyroidism, ICD implant presents to the ER with complaints of abdominal pain and back pain.  Patient states that several days ago she started to have right lower abdominal pain which radiated into her back.  She now states that she feels radiation into her leg occasionally.  She denies any numbness or tingling.  No loss of bowel bladder control.  She does state that she has lost about 7 pounds since March.  She denies any nausea or vomiting.  No dysuria or hematuria.  Does have a history of chronic back pain.  Has been taking Tylenol at home with little relief.    Past Medical History:  Diagnosis Date   Arthritis    Cancer ([place])    left breast cancer    Cataracts, bilateral    removed by surgery   CHF (congestive heart failure) ([place])    PACEMAKER & DEFIB   Complication of anesthesia    hypotensive after back surgery in 2006   Depression    Dyslipidemia    Fainted 04/21/06   AT [facility]   GERD (gastroesophageal reflux disease)    Headache(784.0)    Hearing loss    bilateral hearing aids   HLD (hyperlipidemia)    diet controlled    Hypertension    Hypothyroidism    ICD (implantable cardiac defibrillator) in place    pt has pacer/icd   ICD (implantable cardiac defibrillator), biventricular, in situ    LBBB (left bundle branch block)    Memory loss    Nonischemic cardiomyopathy ([place])    Normal coronary arteries    s/p cardiac cath 2007   Pacemaker    ICD [facility] [employer]   Syncope    Systolic CHF [facility])    Vertigo    Wears glasses     Patient Active Problem List   Diagnosis Date Noted   Dementia without behavioral disturbance ([place])  02/04/2020   Urinary retention 02/04/2020   Seasonal allergies 02/04/2020   Recurrent major depressive disorder, in partial remission ([place]) 07/17/2019   Status post left mastectomy 07/01/2019   Breast cancer of lower-outer quadrant of left female breast ([place]) 06/04/2019   Candida infection, oral 01/15/2019   Senile purpura ([place]) 04/14/2018   Lumbar post-laminectomy syndrome 12/03/2017   Lumbar spondylosis 12/03/2017   History of back surgery 09/01/2017   Constipation 09/01/2017   Hypothyroidism due to acquired atrophy of thyroid 09/01/2017   Mixed hyperlipidemia 09/01/2017   Age-related osteoporosis without current pathological fracture 09/01/2017   High risk medication use 09/01/2017   Arthritis of hand 05/17/2017   Atherosclerosis of native arteries of extremity with intermittent claudication ([place]) 05/15/2017   Status post total bilateral knee replacement 12/20/2016   Gait abnormality 07/16/2016   Chronic low back pain 07/16/2016   Mild cognitive impairment 07/16/2016   Wrist pain 05/10/2015   S/P left TKA 11/29/2014   S/P knee replacement 11/29/2014   Spontaneous bruising 08/27/2014   Numbness and tingling in right hand 07/28/2014   Essential tremor 07/28/2014   Dizziness 05/28/2014   Shaky 05/28/2014   Memory loss 05/28/2014   Depression 05/06/2014   Lipoma of left upper thigh 3x5 cm 09/28/2011  Cerebral vascular accident ([place]) 08/09/2011   Syncope 08/09/2011   History of breast cancer [id number], s/p BCT 2008, triple negative 01/26/2011   Chronic L breast pain with chronic recurrent seroma, s/p excisional biopsy 01/12/2010 01/26/2011   Fainted    ICD (implantable cardioverter-defibrillator), biventricular, in situ 78/46/9629   Chronic systolic heart failure ([place]) 08/02/2010   Hypertension 08/02/2010    Past Surgical History:  Procedure Laterality Date   BACK SURGERY     lumbar fusion    BREAST LUMPECTOMY Left 2008   BREAST SURGERY  2000   LUMP REMOVAL. STAGE 1  CANCER   CARDIAC CATHETERIZATION     CATARACT EXTRACTION     COLONOSCOPY     EYE SURGERY     IMPLANTABLE CARDIOVERTER DEFIBRILLATOR GENERATOR CHANGE N/A 12/18/2012   Procedure: IMPLANTABLE CARDIOVERTER DEFIBRILLATOR GENERATOR CHANGE;  Surgeon: [name], MD;  Location: [facility] CATH LAB;  Service: Cardiovascular;  Laterality: N/A;   JOINT REPLACEMENT  06/14/01   right   LUMBAR FUSION  2006   MASS EXCISION  11/08/2011   Procedure: EXCISION MASS;  Surgeon: [name], MD;  Location: [facility] ORS;  Service: General;  Laterality: Left;  Excision Left Thigh Mass   MASTECTOMY W/ SENTINEL NODE BIOPSY Left 06/04/2019   Procedure: LEFT MASTECTOMY WITH SENTINEL LYMPH NODE BIOPSY;  Surgeon: [name], MD;  Location: [facility];  Service: General;  Laterality: Left;   MASTECTOMY, PARTIAL  2008   GOT PACEMAKER AND DEFIB AT THAT TIME   PACEMAKER INSERTION  04/23/06   TOTAL KNEE ARTHROPLASTY  05/17/01   RIGHT KNEE   TOTAL KNEE ARTHROPLASTY Left 11/29/2014   Procedure: TOTAL LEFT KNEE ARTHROPLASTY;  Surgeon: Paralee Cancel, MD;  Location: [facility] ORS;  Service: Orthopedics;  Laterality: Left;     OB History   No obstetric history on file.     Family History  Problem Relation Age of Onset   Hypertension Mother    Arthritis Mother    Hypertension Father    Hypertension Brother    Hypertension Brother     Social History   Tobacco Use   Smoking status: Never   Smokeless tobacco: Never  Vaping Use   Vaping Use: Never used  Substance Use Topics   Alcohol use: No   Drug use: No    Home Medications Prior to Admission medications   Medication Sig Start Date End Date Taking? Authorizing Provider  acetaminophen (TYLENOL) 500 MG tablet Take 500 mg by mouth as needed.    [provider]  acetaminophen (TYLENOL) 650 MG CR tablet Take 650 mg by mouth every 8 (eight) hours as needed for pain.    [provider]  amLODipine (NORVASC) 5 MG tablet TAKE 1 TABLET BY MOUTH DAILY. Patient taking  differently: Take 0.5 tablets by mouth daily. 06/29/20   [name], NP  ascorbic acid (VITAMIN C) 500 MG tablet Take 500 mg by mouth daily.    [provider]  buPROPion (WELLBUTRIN XL) 150 MG 24 hr tablet Take 1 tablet (150 mg total) by mouth daily. 08/12/20   [name], NP  carvedilol (COREG) 3.125 MG tablet TAKE 1 TABLET BY MOUTH  TWICE DAILY 05/25/20   [name], MD  Cholecalciferol (VITAMIN D3) 50 MCG (2000 UT) TABS Take 2,000 Units by mouth daily.     [provider]  fluticasone (FLONASE) 50 MCG/ACT nasal spray Place 2 sprays into both nostrils daily. 02/04/20   Reed, Tiffany L, DO  furosemide (LASIX) 20  MG tablet TAKE 1 TABLET BY MOUTH  DAILY 05/25/20   [name], NP  levothyroxine (SYNTHROID, LEVOTHROID) 100 MCG tablet Take 1 tablet (100 mcg total) by mouth daily before breakfast. 05/20/15   [name], DO  Lidocaine 4 % PTCH Apply 1 patch topically every 12 (twelve) hours. Apply to back    [provider]  Magnesium 250 MG TABS Take 250 mg by mouth daily.    [provider]  meclizine (ANTIVERT) 12.5 MG tablet Take 1 tablet (12.5 mg total) by mouth 3 (three) times daily as needed for dizziness. 07/23/19   Suzzanne Cloud, NP  methocarbamol (ROBAXIN) 500 MG tablet Take 1 tablet (500 mg total) by mouth every 8 (eight) hours as needed. 07/21/20   [name], NP  mirabegron ER (MYRBETRIQ) 50 MG TB24 tablet Take 1 tablet (50 mg total) by mouth daily. 08/12/20   [name], NP  NONFORMULARY OR COMPOUNDED ITEM Apply 120 Tubes topically daily. Diclofenac/Cyclobenzaprine/lamotrigine/lidocaine/prilocaine (10480) 2%/2%/6%/5%/1.25% cream QTY: 120 GM SIG: (NEURO) APPLY 1-2 PUMPS (1-2 GMS) TO AFFECTED AREA (S) OR FOCAL POINTS 3 TO 4 TIMES DAILY. RUB IN FOR 2 MINUTES TO ACHIEVE MAX PENETRATION. [place] HANDS WELL. 10/07/19   [name], NP  Omega-3 Fatty Acids (FISH OIL PO) Take 1 capsule by mouth daily.    [provider]  omeprazole (PRILOSEC) 40 MG capsule TAKE 1 CAPSULE BY MOUTH  ONCE DAILY FOR STOMACH 01/14/20   [name], NP  Oxcarbazepine (TRILEPTAL) 300 MG tablet Take 300 mg by mouth daily.  01/12/13   [provider]  polyethylene glycol powder (GLYCOLAX/MIRALAX) 17 GM/SCOOP powder Take 1 Container by mouth as needed.    [provider]  potassium chloride (KLOR-CON) 10 MEQ tablet TAKE 1 TABLET BY MOUTH  DAILY 05/25/20   [name], NP  senna-docusate (SENOKOT S) 8.6-50 MG tablet Take 2 tablets by mouth daily. 01/28/20   Reed, Tiffany L, DO  sertraline (ZOLOFT) 100 MG tablet Take 200 mg by mouth daily.  02/27/18   [provider]  spironolactone (ALDACTONE) 25 MG tablet TAKE 1 TABLET BY MOUTH IN  THE MORNING 05/25/20   [name], NP  Vitamin E 400 units TABS Take 400 Units by mouth daily.     [provider]    Allergies    Iodine, Shellfish allergy, Memantine, Aspirin, and Codeine  Review of Systems   Review of Systems  Constitutional:  Negative for chills and fever.  HENT:  Negative for ear pain and sore throat.   Eyes:  Negative for pain and visual disturbance.  Respiratory:  Negative for cough and shortness of breath.   Cardiovascular:  Negative for chest pain and palpitations.  Gastrointestinal:  Positive for abdominal pain. Negative for vomiting.  Genitourinary:  Negative for dysuria and hematuria.  Musculoskeletal:  Positive for back pain. Negative for arthralgias.  Skin:  Negative for color change and rash.  Neurological:  Negative for seizures, syncope and weakness.  All other systems reviewed and are negative.  Physical Exam Updated Vital Signs BP 131/71   Pulse 72   Temp (!) 97.3 F (36.3 C) (Oral)   Resp 18   LMP  (LMP Unknown)   SpO2 98%   Physical Exam Vitals and nursing note reviewed.  Constitutional:      General: She is not in acute distress.    Appearance: She is well-developed.  HENT:      Head: Normocephalic and atraumatic.  Eyes:     Conjunctiva/sclera: Conjunctivae normal.  Cardiovascular:     Rate and Rhythm: Normal rate and regular rhythm.     Heart sounds: No murmur heard. Pulmonary:     Effort: Pulmonary effort is normal. No respiratory distress.     Breath sounds: Normal breath sounds.  Abdominal:     Palpations: Abdomen is soft.     Tenderness: There is abdominal tenderness.     Comments: Significant right lower quadrant tenderness with guarding.  She also has right-sided CVA tenderness and midline tenderness to the L-spine.  5/5 strength in upper and lower extremities bilaterally.  No cervical spine tenderness.  Neurovascularly intact  Musculoskeletal:     Cervical back: Neck supple.  Skin:    General: Skin is warm and dry.  Neurological:     Mental Status: She is alert.    ED Results / Procedures / Treatments   Labs (all labs ordered are listed, but only abnormal results are displayed) Labs Reviewed  CBC WITH DIFFERENTIAL/PLATELET - Abnormal; Notable for the following components:      Result Value   Platelets 125 (*)    All other components within normal limits  URINALYSIS, ROUTINE W REFLEX MICROSCOPIC - Abnormal; Notable for the following components:   APPearance HAZY (*)    All other components within normal limits  COMPREHENSIVE METABOLIC PANEL    EKG None  Radiology No results found.  Procedures Procedures   Medications Ordered in ED Medications  fentaNYL (SUBLIMAZE) injection 25 mcg (25 mcg Intravenous Given 09/18/20 1212)    ED Course  I have reviewed the triage vital signs and the nursing notes.  Pertinent labs & imaging results that were available during my care of the patient were reviewed by me and considered in my medical decision making (see chart for details).    MDM Rules/Calculators/A&P                          87 year old female with complaints of back pain and abdominal pain.  On arrival, she is uncomfortable  appearing, but in no acute distress, nontoxic appearing.  Vitals overall reassuring, afebrile, not tachycardic and tachypneic or hypoxic.  Physical exam with significant right lower quadrant tenderness and midline tenderness to the L-spine with associated CVA tenderness.  She is neurovascularly intact, no noticeable weakness in her extremities bilaterally.  DDx includes appendicitis, renal stone, pyelonephritis,chronic low back pain versus acute, malignancy  Plan for basic labs, UA, patient has a documented allergy to contrast dye, plan for CT abdomen pelvis with L-spine without contrast.  Fentanyl provided for pain.  Labs and imaging ordered, reviewed and interpreted by me.  CMP unremarkable, CBC without leukocytosis.  UA without any evidence of blood or UTI.   CT of the abdomen pelvis without contrast without any acute findings.  CT L-spine no evidence of acute lumbar spine fracture, hardware complication, or any other acute findings   Patient improved reports mild improvement in pain after fentanyl.  She is resting comfortably.  Lower suspicion for dissection given no chest pain, shortness of breath.  I did inspect her skin for possible shingles, however she has no evidence of a rash.  I discussed the reassuring findings with the patient and her daughter at bedside.  I did offer her a course of tramadol or Norco, however the patient declined this.  I did encourage her to take over-the-counter topical Voltaren.  Encouraged PCP follow-up this week.  Return precautions  discussed.  She was understanding and is agreeable. Stable for discharge.   This was a shared visit with my supervising physician Zackowski who independently saw and evaluated the patient & provided guidance in evaluation/management/disposition ,in agreement with care    Final Clinical Impression(s) / ED Diagnoses Final diagnoses:  Low back pain    Rx / DC Orders ED Discharge Orders     None        Lyndel Safe 09/18/20 1454    Fredia Sorrow, MD 09/21/20 1627

## 2020-09-19 NOTE — Addendum Note (Signed)
Addended by: Valerie Salts on: 09/19/2020 01:23 PM   Modules accepted: Orders

## 2020-09-19 NOTE — Telephone Encounter (Signed)
Called and spoke with patient. She verbalized understanding. Appt for next month has been cancelled. Recall has been placed for OV with PFT in 3 months.

## 2020-09-22 ENCOUNTER — Ambulatory Visit: Payer: Medicare Other | Admitting: Family

## 2020-09-22 ENCOUNTER — Other Ambulatory Visit: Payer: Self-pay

## 2020-09-28 ENCOUNTER — Other Ambulatory Visit: Payer: Self-pay

## 2020-09-28 ENCOUNTER — Encounter: Payer: Self-pay | Admitting: Family

## 2020-09-28 ENCOUNTER — Ambulatory Visit (INDEPENDENT_AMBULATORY_CARE_PROVIDER_SITE_OTHER): Payer: Medicare Other | Admitting: Family

## 2020-09-28 VITALS — BP 130/84 | HR 76 | Temp 96.9°F | Resp 16 | Ht 59.0 in | Wt 114.2 lb

## 2020-09-28 DIAGNOSIS — F319 Bipolar disorder, unspecified: Secondary | ICD-10-CM | POA: Diagnosis not present

## 2020-09-28 DIAGNOSIS — I10 Essential (primary) hypertension: Secondary | ICD-10-CM

## 2020-09-28 DIAGNOSIS — F0390 Unspecified dementia without behavioral disturbance: Secondary | ICD-10-CM

## 2020-09-28 DIAGNOSIS — K5904 Chronic idiopathic constipation: Secondary | ICD-10-CM | POA: Diagnosis not present

## 2020-09-28 DIAGNOSIS — M545 Low back pain, unspecified: Secondary | ICD-10-CM | POA: Diagnosis not present

## 2020-09-28 DIAGNOSIS — M79605 Pain in left leg: Secondary | ICD-10-CM

## 2020-09-28 DIAGNOSIS — N3281 Overactive bladder: Secondary | ICD-10-CM

## 2020-09-28 LAB — CBC WITH DIFFERENTIAL/PLATELET
Absolute Monocytes: 960 cells/uL — ABNORMAL HIGH (ref 200–950)
Basophils Absolute: 58 cells/uL (ref 0–200)
Basophils Relative: 0.6 %
Eosinophils Absolute: 165 cells/uL (ref 15–500)
Eosinophils Relative: 1.7 %
HCT: 42.7 % (ref 35.0–45.0)
Hemoglobin: 14 g/dL (ref 11.7–15.5)
Lymphs Abs: 1804 cells/uL (ref 850–3900)
MCH: 28.8 pg (ref 27.0–33.0)
MCHC: 32.8 g/dL (ref 32.0–36.0)
MCV: 87.9 fL (ref 80.0–100.0)
MPV: 11.2 fL (ref 7.5–12.5)
Monocytes Relative: 9.9 %
Neutro Abs: 6712 cells/uL (ref 1500–7800)
Neutrophils Relative %: 69.2 %
Platelets: 135 10*3/uL — ABNORMAL LOW (ref 140–400)
RBC: 4.86 10*6/uL (ref 3.80–5.10)
RDW: 14.2 % (ref 11.0–15.0)
Total Lymphocyte: 18.6 %
WBC: 9.7 10*3/uL (ref 3.8–10.8)

## 2020-09-28 LAB — BASIC METABOLIC PANEL WITH GFR
BUN/Creatinine Ratio: 39 (calc) — ABNORMAL HIGH (ref 6–22)
BUN: 38 mg/dL — ABNORMAL HIGH (ref 7–25)
CO2: 27 mmol/L (ref 20–32)
Calcium: 10.8 mg/dL — ABNORMAL HIGH (ref 8.6–10.4)
Chloride: 102 mmol/L (ref 98–110)
Creat: 0.97 mg/dL — ABNORMAL HIGH (ref 0.60–0.88)
GFR, Est African American: 62 mL/min/{1.73_m2} (ref >=60)
GFR, Est Non African American: 54 mL/min/{1.73_m2} — ABNORMAL LOW (ref >=60)
Glucose, Bld: 134 mg/dL — ABNORMAL HIGH (ref 65–99)
Potassium: 4.4 mmol/L (ref 3.5–5.3)
Sodium: 139 mmol/L (ref 135–146)

## 2020-09-28 MED ORDER — METHOCARBAMOL 500 MG PO TABS: 500.0000 mg | ORAL_TABLET | Freq: Three times a day (TID) | ORAL | 3 refills | Status: DC | PRN

## 2020-09-28 NOTE — Progress Notes (Signed)
Provider: Leniyah Martell FNP-C  [name], NP  Patient Care Team: [name], NP as PCP - General (Geriatric Medicine) [name], MD as Consulting Physician (General Surgery) Paralee Cancel, MD as Consulting Physician (Orthopedic Surgery) Marica Otter, [place] (Optometry) Marcial Pacas, MD as Consulting Physician (Neurology) [name], MD as Consulting Physician (Cardiology) Ricard Dillon, MD (Psychiatry)  Extended Emergency Contact Information Primary Emergency Contact: Analissa, [place] Home Phone: [phone] Relation: Daughter Secondary Emergency Contact: [facility] Address: [address]          [place], [place] [address] Johnnette Litter of [place] Phone: [phone] Relation: Friend  Code Status:  DNR Goals of care: Advanced Directive information Advanced Directives 09/28/2020  Does Patient Have a Medical Advance Directive? Yes  Type of Advance Directive Living will;Out of facility DNR (pink MOST or yellow form)  Does patient want to make changes to medical advance directive? No - Patient declined  Copy of [place] in Chart? -  Would patient like information on creating a medical advance directive? -  Pre-existing out of facility DNR order (yellow form or pink MOST form) -     Chief Complaint  Patient presents with   Hospitalization [facility] Follow Up from 09/18/2020    HPI:  Pt is a 87 y.o. female seen today for an acute visit for Hospital follow up.she is here with her daughter.She has a medical history of Hypertension,CHF,Hypothyroidism,GERD,depression she was was seen in the ED 09/18/2020 abdominal pain and lower back pain with radiation to the right leg.she had significant right lower quadrant and midline tenderness to the L-spine associated with CVA tenderness.Her neurovascular exam was intact with no noticeable weakness in her extremities bilaterally.Fentanyl was given for pain due to allergies to  codeine.Her labs were unremarkable and U/A showed no signs of infection.  She had CT scan of the Abdomen pelvis w/o contrast and CT scan of L-spine which showed no acute issues.tramadol and Vicodin was offered but decline though on chart review she has Codeine list as allergies.Her pain improved with fentanyl.she was discharge home on OTC topical voltaren gel.Advised to follow up with PCP.   she states still has lower back pain radiating to right leg has been taking Tylenol and topical analgesic.    Past Medical History:  Diagnosis Date   Arthritis    Cancer ([place])    left breast cancer    Cataracts, bilateral    removed by surgery   CHF (congestive heart failure) ([place])    PACEMAKER & DEFIB   Complication of anesthesia    hypotensive after back surgery in 2006   Depression    Dyslipidemia    Fainted 04/21/06   AT [facility]   GERD (gastroesophageal reflux disease)    Headache(784.0)    Hearing loss    bilateral hearing aids   HLD (hyperlipidemia)    diet controlled    Hypertension    Hypothyroidism    ICD (implantable cardiac defibrillator) in place    pt has pacer/icd   ICD (implantable cardiac defibrillator), biventricular, in situ    LBBB (left bundle branch block)    Memory loss    Nonischemic cardiomyopathy ([place])    Normal coronary arteries    s/p cardiac cath 2007   Pacemaker    ICD [employer]   Syncope    Systolic CHF [facility])    Vertigo    Wears glasses    Past Surgical History:  Procedure Laterality Date  BACK SURGERY     lumbar fusion    BREAST LUMPECTOMY Left 2008   BREAST SURGERY  2000   LUMP REMOVAL. STAGE 1 CANCER   CARDIAC CATHETERIZATION     CATARACT EXTRACTION     COLONOSCOPY     EYE SURGERY     IMPLANTABLE CARDIOVERTER DEFIBRILLATOR GENERATOR CHANGE N/A 12/18/2012   Procedure: IMPLANTABLE CARDIOVERTER DEFIBRILLATOR GENERATOR CHANGE;  Surgeon: [name], MD;  Location: [facility] CATH LAB;  Service: Cardiovascular;  Laterality: N/A;   JOINT  REPLACEMENT  06/14/01   right   LUMBAR FUSION  2006   MASS EXCISION  11/08/2011   Procedure: EXCISION MASS;  Surgeon: [name], MD;  Location: [facility] ORS;  Service: General;  Laterality: Left;  Excision Left Thigh Mass   MASTECTOMY W/ SENTINEL NODE BIOPSY Left 06/04/2019   Procedure: LEFT MASTECTOMY WITH SENTINEL LYMPH NODE BIOPSY;  Surgeon: [name], MD;  Location: [place];  Service: General;  Laterality: Left;   MASTECTOMY, PARTIAL  2008   GOT PACEMAKER AND DEFIB AT THAT TIME   PACEMAKER INSERTION  04/23/06   TOTAL KNEE ARTHROPLASTY  05/17/01   RIGHT KNEE   TOTAL KNEE ARTHROPLASTY Left 11/29/2014   Procedure: TOTAL LEFT KNEE ARTHROPLASTY;  Surgeon: Paralee Cancel, MD;  Location: [facility];  Service: Orthopedics;  Laterality: Left;    Allergies  Allergen Reactions   Iodine Shortness Of Breath    Iodine contrast, CHF , SOB   Shellfish Allergy Shortness Of Breath   Memantine     Malaise, fogginess/ couldn't think   Aspirin Nausea And Vomiting   Codeine Nausea And Vomiting    Outpatient Encounter Medications as of 09/28/2020  Medication Sig   acetaminophen (TYLENOL) 500 MG tablet Take 500 mg by mouth as needed.   acetaminophen (TYLENOL) 650 MG CR tablet Take 650 mg by mouth every 8 (eight) hours as needed for pain.   amLODipine (NORVASC) 5 MG tablet TAKE 1 TABLET BY MOUTH DAILY.   ascorbic acid (VITAMIN C) 500 MG tablet Take 500 mg by mouth daily.   buPROPion (WELLBUTRIN XL) 150 MG 24 hr tablet Take 1 tablet (150 mg total) by mouth daily.   carvedilol (COREG) 3.125 MG tablet TAKE 1 TABLET BY MOUTH  TWICE DAILY   Cholecalciferol (VITAMIN D3) 50 MCG (2000 UT) TABS Take 2,000 Units by mouth daily.    diclofenac Sodium (VOLTAREN) 1 % GEL Apply 4 g topically 4 (four) times daily.   fluticasone (FLONASE) 50 MCG/ACT nasal spray Place 2 sprays into both nostrils daily.   furosemide (LASIX) 20 MG tablet TAKE 1 TABLET BY MOUTH  DAILY   levothyroxine (SYNTHROID, LEVOTHROID) 100 MCG tablet Take 1 tablet  (100 mcg total) by mouth daily before breakfast.   Lidocaine 4 % PTCH Apply 1 patch topically every 12 (twelve) hours. Apply to back   Magnesium 250 MG TABS Take 250 mg by mouth daily.   meclizine (ANTIVERT) 12.5 MG tablet Take 1 tablet (12.5 mg total) by mouth 3 (three) times daily as needed for dizziness.   mirabegron ER (MYRBETRIQ) 50 MG TB24 tablet Take 1 tablet (50 mg total) by mouth daily.   NONFORMULARY OR COMPOUNDED ITEM Apply 120 Tubes topically daily. Diclofenac/Cyclobenzaprine/lamotrigine/lidocaine/prilocaine (10480) 2%/2%/6%/5%/1.25% cream QTY: 120 GM SIG: (NEURO) APPLY 1-2 PUMPS (1-2 GMS) TO AFFECTED AREA (S) OR FOCAL POINTS 3 TO 4 TIMES DAILY. RUB IN FOR 2 MINUTES TO ACHIEVE MAX PENETRATION. [place] HANDS WELL.   Omega-3 Fatty Acids (FISH OIL PO) Take 1 capsule by  mouth daily.   omeprazole (PRILOSEC) 40 MG capsule TAKE 1 CAPSULE BY MOUTH  ONCE DAILY FOR STOMACH   Oxcarbazepine (TRILEPTAL) 300 MG tablet Take 300 mg by mouth daily.    polyethylene glycol powder (GLYCOLAX/MIRALAX) 17 GM/SCOOP powder Take 1 Container by mouth as needed.   potassium chloride (KLOR-CON) 10 MEQ tablet TAKE 1 TABLET BY MOUTH  DAILY   senna-docusate (SENOKOT S) 8.6-50 MG tablet Take 2 tablets by mouth daily.   sertraline (ZOLOFT) 100 MG tablet Take 200 mg by mouth daily.    spironolactone (ALDACTONE) 25 MG tablet TAKE 1 TABLET BY MOUTH IN  THE MORNING   Vitamin E 400 units TABS Take 400 Units by mouth daily.    [DISCONTINUED] methocarbamol (ROBAXIN) 500 MG tablet Take 1 tablet (500 mg total) by mouth every 8 (eight) hours as needed.   methocarbamol (ROBAXIN) 500 MG tablet Take 1 tablet (500 mg total) by mouth every 8 (eight) hours as needed.   No facility-administered encounter medications on file as of 09/28/2020.    Review of Systems  Constitutional:  Negative for appetite change, chills, fatigue, fever and unexpected weight change.  HENT:  Negative for congestion, dental problem, ear discharge, ear pain,  facial swelling, hearing loss, nosebleeds, postnasal drip, rhinorrhea, sinus pressure, sinus pain, sneezing, sore throat, tinnitus and trouble swallowing.   Eyes:  Negative for pain, discharge, redness, itching and visual disturbance.  Respiratory:  Negative for cough, chest tightness, shortness of breath and wheezing.   Cardiovascular:  Negative for chest pain, palpitations and leg swelling.  Gastrointestinal:  Positive for constipation. Negative for abdominal distention, abdominal pain, blood in stool, diarrhea, nausea and vomiting.  Endocrine: Negative for cold intolerance, heat intolerance, polydipsia, polyphagia and polyuria.  Genitourinary:  Negative for decreased urine volume, difficulty urinating, dysuria, flank pain and urgency.       Overactive bladder   Musculoskeletal:  Positive for arthralgias, back pain and gait problem. Negative for joint swelling, myalgias, neck pain and neck stiffness.  Skin:  Negative for color change, pallor, rash and wound.  Neurological:  Negative for dizziness, syncope, speech difficulty, weakness, light-headedness, numbness and headaches.  Hematological:  Does not bruise/bleed easily.  Psychiatric/Behavioral:  Positive for confusion. Negative for agitation, behavioral problems, hallucinations, self-injury, sleep disturbance and suicidal ideas. The patient is not nervous/anxious.    Immunization History  Administered Date(s) Administered   Fluad Quad(high Dose 65+) 01/28/2020   Influenza, High Dose Seasonal PF 01/16/2018, 12/08/2018   Influenza-Unspecified 12/08/2013, 01/10/2015, 12/09/2015, 01/12/2017   PFIZER(Purple Top)SARS-COV-2 Vaccination 05/23/2019, 06/15/2019, 12/23/2019   Pneumococcal Conjugate-13 04/09/2012   Pneumococcal Polysaccharide-23 02/28/2016   Tdap 01/07/2013   Zoster, Live 08/05/2014   Pertinent  Health Maintenance Due  Topic Date Due   INFLUENZA VACCINE  11/07/2020   MAMMOGRAM  03/18/2021   DEXA SCAN  Completed   PNA vac Low  Risk Adult  Completed   Fall Risk  09/28/2020 08/12/2020 07/15/2020 02/15/2020 02/04/2020  Falls in the past year? 0 0 1 0 0  Number falls in past yr: 0 0 1 0 0  Injury with Fall? 0 0 0 0 0  Comment - - - - -  Risk for fall due to : - - History of fall(s) - -   Functional Status Survey:    Vitals:   09/28/20 0955  BP: 130/84  Pulse: 76  Resp: 16  Temp: (!) 96.9 F (36.1 C)  SpO2: 96%  Weight: 114 lb 3.2 oz (51.8 kg)  Height: $Remove'4\' 11"'rqdRrjl$  (  1.499 m)   Body mass index is 23.07 kg/m. Physical Exam Vitals reviewed.  Constitutional:      General: She is not in acute distress.    Appearance: Normal appearance. She is normal weight. She is not ill-appearing or diaphoretic.  HENT:     Head: Normocephalic.     Right Ear: Tympanic membrane, ear canal and external ear normal. There is no impacted cerumen.     Left Ear: Tympanic membrane, ear canal and external ear normal. There is no impacted cerumen.     Nose: Nose normal. No congestion or rhinorrhea.     Mouth/Throat:     Mouth: Mucous membranes are moist.     Pharynx: Oropharynx is clear. No oropharyngeal exudate or posterior oropharyngeal erythema.  Eyes:     General: No scleral icterus.       Right eye: No discharge.        Left eye: No discharge.     Extraocular Movements: Extraocular movements intact.     Conjunctiva/sclera: Conjunctivae normal.     Pupils: Pupils are equal, round, and reactive to light.  Neck:     Vascular: No carotid bruit.  Cardiovascular:     Rate and Rhythm: Normal rate and regular rhythm.     Pulses: Normal pulses.     Heart sounds: Normal heart sounds. No murmur heard.   No friction rub. No gallop.  Pulmonary:     Effort: Pulmonary effort is normal. No respiratory distress.     Breath sounds: Normal breath sounds. No wheezing, rhonchi or rales.  Chest:     Chest wall: No tenderness.  Abdominal:     General: Bowel sounds are normal. There is no distension.     Palpations: Abdomen is soft. There is no  mass.     Tenderness: There is no abdominal tenderness. There is right CVA tenderness. There is no left CVA tenderness, guarding or rebound.  Musculoskeletal:        General: No swelling.     Cervical back: Normal range of motion. No rigidity or tenderness.     Lumbar back: Spasms and tenderness present. No swelling. Negative right straight leg raise test and negative left straight leg raise test.     Right lower leg: No edema.     Left lower leg: No edema.     Comments: Unsteady gait   Lymphadenopathy:     Cervical: No cervical adenopathy.  Skin:    General: Skin is warm and dry.     Coloration: Skin is not pale.     Findings: No bruising, erythema, lesion or rash.  Neurological:     Mental Status: She is alert and oriented to person, place, and time.     Cranial Nerves: No cranial nerve deficit.     Sensory: No sensory deficit.     Motor: No weakness.     Coordination: Coordination normal.     Gait: Gait abnormal.  Psychiatric:        Mood and Affect: Mood normal.        Speech: Speech normal.        Behavior: Behavior normal.        Thought Content: Thought content normal.        Judgment: Judgment normal.    Labs reviewed: Recent Labs    01/28/20 0829 06/20/20 0944 09/18/20 1210  NA 141 140 139  K 4.5 4.7 3.8  CL 102 104 103  CO2 33* 29 27  GLUCOSE 91  98 96  BUN 31* 16 21  CREATININE 0.95* 0.98 0.64  CALCIUM 10.5* 10.1 9.9   Recent Labs    12/23/19 0859 01/28/20 0829 06/20/20 0944 09/18/20 1210  AST $Re'26 26 26 26  'CXj$ ALT $R'24 27 26 22  'Iw$ ALKPHOS 69  --  68 48  BILITOT 0.3 0.4 0.3 0.4  PROT 7.6 7.2 7.8 7.1  ALBUMIN 3.7  --  4.0 4.0   Recent Labs    06/20/20 0944 09/18/20 1210 09/28/20 1100  WBC 7.8 8.0 9.7  NEUTROABS 5.2 5.2 6,712  HGB 13.7 12.5 14.0  HCT 41.2 37.9 42.7  MCV 86.0 87.3 87.9  PLT 124* 125* 135*   Lab Results  Component Value Date   TSH 1.52 07/17/2019   No results found for: HGBA1C Lab Results  Component Value Date   CHOL 182  11/10/2018   HDL 60 11/10/2018   LDLCALC 104 (H) 11/10/2018   TRIG 88 11/10/2018   CHOLHDL 3.0 11/10/2018    Significant Diagnostic Results in last 30 days:  CT ABDOMEN PELVIS WO CONTRAST  Result Date: 09/18/2020 CLINICAL DATA:  Right lower quadrant abdominal pain since Friday. EXAM: CT ABDOMEN AND PELVIS WITHOUT CONTRAST TECHNIQUE: Multidetector CT imaging of the abdomen and pelvis was performed following the standard protocol without IV contrast. COMPARISON:  06/08/2020 FINDINGS: Lower chest: Chronic pulmonary scarring changes consistent with interstitial lung disease as noted on recent chest CT. No focal infiltrates, effusions or worrisome pulmonary lesions. The heart is normal in size. Pacer wires are noted. Hepatobiliary: Stable left hepatic lobe cyst. No worrisome hepatic lesions or intrahepatic biliary dilatation. The gallbladder is unremarkable. No common bile duct dilatation. Pancreas: No mass, inflammation or ductal dilatation. Spleen: Normal size.  No focal lesions. Adrenals/Urinary Tract: The adrenal glands and kidneys are unremarkable. Small right renal cyst is noted. The bladder is unremarkable. Stomach/Bowel: The stomach, duodenum, small bowel and colon are grossly normal without oral contrast. No acute inflammatory process, mass lesions or obstructive findings. The terminal ileum is normal. The appendix is normal. Vascular/Lymphatic: Stable advanced atherosclerotic calcifications involving the aorta and branch vessels. No abdominal or pelvic mass or adenopathy. Reproductive: The uterus and ovaries are unremarkable. Other: No pelvic mass or adenopathy. No free pelvic fluid collections. No inguinal mass or adenopathy. No abdominal wall hernia or subcutaneous lesions. Musculoskeletal: Stable appearance of the lumbar fusion hardware. No complicating features. Stable degenerative lumbar spondylosis with multilevel disc disease and facet disease. IMPRESSION: 1. No acute abdominal/pelvic findings,  mass lesions or adenopathy. 2. Stable advanced atherosclerotic calcifications involving the aorta and branch vessels. 3. Chronic pulmonary scarring changes at the lung bases. Aortic Atherosclerosis (ICD10-I70.0). Electronically Signed   By: Marijo Sanes M.D.   On: 09/18/2020 14:33   CT Chest High Resolution  Result Date: 09/10/2020 CLINICAL DATA:  Interstitial lung disease, abnormal lung bases on prior CT of the abdomen and pelvis EXAM: CT CHEST WITHOUT CONTRAST TECHNIQUE: Multidetector CT imaging of the chest was performed following the standard protocol without intravenous contrast. High resolution imaging of the lungs, as well as inspiratory and expiratory imaging, was performed. COMPARISON:  06/08/2020 FINDINGS: Cardiovascular: Left chest multi lead pacer defibrillator. Scattered aortic atherosclerosis. Normal heart size. Left and right coronary artery calcifications. No pericardial effusion. Mediastinum/Nodes: No enlarged mediastinal, hilar, or axillary lymph nodes. Thyroid gland, trachea, and esophagus demonstrate no significant findings. Lungs/Pleura: There is very extensive, clustered, confluent, somewhat ill-defined nodularity throughout the lungs, many nodules concentrated along the fissures and with a slight upper lobe  predominance. There is some associated irregular peripheral interstitial opacity. No significant air trapping on expiratory phase imaging. No pleural effusion or pneumothorax. Upper Abdomen: No acute abnormality. Musculoskeletal: No chest wall mass or suspicious bone lesions identified. IMPRESSION: 1. There is very extensive, clustered, confluent, somewhat ill-defined nodularity throughout the lungs, many nodules concentrated along the fissures and with a slight upper lobe predominance. There is some associated irregular peripheral interstitial opacity. 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 Unusual appearance and distribution of an atypical infection  is a differential consideration. 2. Coronary artery disease. Aortic Atherosclerosis (ICD10-I70.0). Electronically Signed   By: Eddie Candle M.D.   On: 09/10/2020 11:10   CT L-SPINE NO CHARGE  Result Date: 09/18/2020 CLINICAL DATA:  Right-sided lumbar back pain EXAM: CT LUMBAR SPINE WITHOUT CONTRAST TECHNIQUE: Multidetector CT imaging of the lumbar spine was performed without intravenous contrast administration. Multiplanar CT image reconstructions were also generated. COMPARISON:  CT 06/08/2020, radiograph 01/28/2020 FINDINGS: Segmentation: 5 lumbar type vertebrae. Alignment: Trace retrolisthesis at L1-L2 and L2-L3. Grade 1 anterolisthesis at L3-L4. Vertebrae: Prior posterior decompression at L3 and L4 with posterior and interbody fusion at L4-L5. Intact hardware without evidence of loosening. There is no evidence of acute fracture. Paraspinal and other soft tissues: Small right renal cyst. Aorta bi-iliac atherosclerotic calcifications. Lower paraspinal muscle atrophy. Disc levels: L1-L2: Right-sided disc bulging and trace retrolisthesis at L1-L2 result in moderate-severe right neural foraminal narrowing. L2-L3: Broad-based disc bulging and trace retrolisthesis at L2-L3 along with facet arthritis results in mild canal narrowing and moderate right and mild left neural foraminal narrowing. L3-4: Grade 1 anterolisthesis. Broad-based disc bulging facet arthritis results in moderate spinal canal narrowing. Mild-to-moderate bilateral neural foraminal narrowing. L4-L5: Posterior and interbody fusion without evidence of malalignment or hardware complication. L5-S1: Broad-based disc bulging and facet arthritis result in mild-to-moderate bilateral neural foraminal narrowing. IMPRESSION: Prior posterior decompression at L3 and L4 with posterior and interbody fusion at L4-L5. No evidence of hardware complication. No acute lumbar spine fracture. Multilevel degenerative disc disease and facet arthritis above and below the  fusion, with varying degrees of mild-moderate spinal canal and moderate-severe neural foraminal narrowing as described above. Aortic Atherosclerosis (ICD10-I70.0). Electronically Signed   By: Maurine Simmering   On: 09/18/2020 14:34    Assessment/Plan  1. Essential hypertension - B/p well controlled. - continue current medication - CBC with Differential/Platelet - BMP with eGFR([facility])  2. Low back pain radiating to left lower extremity Status post ED visit as above.CT scan abdomen and L-spine were negative for acute abnormalities. - will refill robaxin to relief   - methocarbamol (ROBAXIN) 500 MG tablet; Take 1 tablet (500 mg total) by mouth every 8 (eight) hours as needed.  Dispense: 30 tablet; Refill: 3  3. Chronic idiopathic constipation Continue on miralax and Senna  - encouraged to increase fiber in diet  - increase water intake to 6-8 glasses of water daily and exercise as tolerated   4. Bipolar depression ([place]) Mood stable Continue on Bupropion   5. Dementia without behavioral disturbance, unspecified dementia type ([place]) No behavioral issues reported. - continue with supportive care   6. Overactive bladder Continue on Mirabegron   Family/ staff Communication: Reviewed plan of care with patient and daughter   Labs/tests ordered:  - CBC with Differential/Platelet - BMP with eGFR([facility])  Next Appointment: As needed if symptoms worsen or fail to improve    Sandrea Hughs, NP

## 2020-10-12 ENCOUNTER — Ambulatory Visit: Payer: Medicare Other | Admitting: Pulmonary Disease

## 2020-10-13 ENCOUNTER — Encounter: Payer: Self-pay | Admitting: Neurology

## 2020-10-13 ENCOUNTER — Other Ambulatory Visit: Payer: Self-pay

## 2020-10-13 ENCOUNTER — Ambulatory Visit (INDEPENDENT_AMBULATORY_CARE_PROVIDER_SITE_OTHER): Payer: Medicare Other | Admitting: Neurology

## 2020-10-13 VITALS — BP 125/73 | HR 78 | Ht 59.0 in | Wt 116.5 lb

## 2020-10-13 DIAGNOSIS — F03A0 Unspecified dementia, mild, without behavioral disturbance, psychotic disturbance, mood disturbance, and anxiety: Secondary | ICD-10-CM

## 2020-10-13 DIAGNOSIS — R2681 Unsteadiness on feet: Secondary | ICD-10-CM

## 2020-10-13 DIAGNOSIS — F0390 Unspecified dementia without behavioral disturbance: Secondary | ICD-10-CM | POA: Diagnosis not present

## 2020-10-13 NOTE — Progress Notes (Signed)
NEUROLOGY CONSULTATION NOTE  [name] MRN: [id number] DOB: 25-Dec-1933  Referring provider: Sherrie Mustache, NP Primary care provider: Sherrie Mustache, NP  Reason for consult:  second opinion for memory loss  Thank you for your kind referral of [name] for consultation of the above symptoms. Although her history is well known to you, please allow me to reiterate it for the purpose of our medical record. The patient was accompanied to the clinic by her daughter [name] who also provides collateral information. Records and images were personally reviewed where available.   HISTORY OF PRESENT ILLNESS: This is an 87 year old right-handed woman with a history of hypertension, hyperlipidemia, hypothyroidism, LBBB s/p ICD placement, breast cancer s/p mastectomy, chronic back pain, bipolar depression, anxiety, presenting for evaluation of memory loss. She had been following with [facility] for memory loss since 2014-07-23, records were reviewed. She started noticing mild short-term memory changes in Jul 23, 2002 after lumbar surgery. Over the years, memory changes have progressed, her daughter moved in with her in 23-Jul-2019 and she stopped driving. She had side effects on Donepezil, Rivastigmine, and Memantine.   She states her memory is "terrible." [name] reports memory changes became more noticeable after her husband passed away in 07-23-2018. She had left the stove on a couple of times and left keys outside the door. She would forget what she was going to say and forget conversations from the day prior. She would repeat herself at times. She manages her own medications, [name] checks behind her. Her husband was managing finances, [name] took over when he passed away. She stopped driving in Jan/Feb 9381 because she was having staggering gait. She is independent with dressing and bathing. Notes from [place] indicate she was tried on Donepezil in 23-Jul-2015, Memantine in 2016/07/22, Rivastigmine in 07-22-2017. She felt  "like a mummy, could not think."   She denies any headaches, diplopia, dysarthria/dysphagia, neck pain, focal numbness/tingling/weakness, anosmia. She has chronic back pain and constipation. She has occasional dizziness. There are tremors in both hands affecting handwriting. Sleep is okay, she usually sleeps 8 hours at night. Her last fall was in January 2022 when she fell off the bed. She had worked with physical therapy and staggering gait has stabilized. Mood is "sometimes stable," she is more irritable with herself when she could not remember things. [name] notices she is more anxious when she has to leave. No hallucinations or paranoia. No family history of dementia, history of significant head injuries, or alcohol use.    Laboratory Data: Lab Results  Component Value Date   TSH 1.52 07/17/2019   Lab Results  Component Value Date   VITAMINB12 1,117 (H) 07/28/2014    I personally reviewed head CT without contrast done 07/2019 which did not show any acute changes. There was mild chronic microvascular disease and mild diffuse atrophy that have slightly progressed compared to 23-Jul-2014 imaging.   PAST MEDICAL HISTORY: Past Medical History:  Diagnosis Date   Arthritis    Cancer ([place])    left breast cancer    Cataracts, bilateral    removed by surgery   CHF (congestive heart failure) ([place])    PACEMAKER & DEFIB   Complication of anesthesia    hypotensive after back surgery in 2004-07-22   Depression    Dyslipidemia    Fainted 04/21/06   AT [facility]   GERD (gastroesophageal reflux disease)    Headache(784.0)    Hearing loss    bilateral hearing aids   HLD (hyperlipidemia)  diet controlled    Hypertension    Hypothyroidism    ICD (implantable cardiac defibrillator) in place    pt has pacer/icd   ICD (implantable cardiac defibrillator), biventricular, in situ    LBBB (left bundle branch block)    Memory loss    Nonischemic cardiomyopathy ([place])    Normal coronary arteries    s/p cardiac  cath 2007   Pacemaker    ICD [employer]   Syncope    Systolic CHF [facility])    Vertigo    Wears glasses     PAST SURGICAL HISTORY: Past Surgical History:  Procedure Laterality Date   BACK SURGERY     lumbar fusion    BREAST LUMPECTOMY Left 2008   BREAST SURGERY  2000   LUMP REMOVAL. STAGE 1 CANCER   CARDIAC CATHETERIZATION     CATARACT EXTRACTION     COLONOSCOPY     EYE SURGERY     IMPLANTABLE CARDIOVERTER DEFIBRILLATOR GENERATOR CHANGE N/A 12/18/2012   Procedure: IMPLANTABLE CARDIOVERTER DEFIBRILLATOR GENERATOR CHANGE;  Surgeon: [name], MD;  Location: [facility] CATH LAB;  Service: Cardiovascular;  Laterality: N/A;   JOINT REPLACEMENT  06/14/01   right   LUMBAR FUSION  2006   MASS EXCISION  11/08/2011   Procedure: EXCISION MASS;  Surgeon: [name], MD;  Location: [facility] ORS;  Service: General;  Laterality: Left;  Excision Left Thigh Mass   MASTECTOMY W/ SENTINEL NODE BIOPSY Left 06/04/2019   Procedure: LEFT MASTECTOMY WITH SENTINEL LYMPH NODE BIOPSY;  Surgeon: [name], MD;  Location: [place];  Service: General;  Laterality: Left;   MASTECTOMY, PARTIAL  2008   GOT PACEMAKER AND DEFIB AT THAT TIME   PACEMAKER INSERTION  04/23/06   TOTAL KNEE ARTHROPLASTY  05/17/01   RIGHT KNEE   TOTAL KNEE ARTHROPLASTY Left 11/29/2014   Procedure: TOTAL LEFT KNEE ARTHROPLASTY;  Surgeon: Paralee Cancel, MD;  Location: [facility] ORS;  Service: Orthopedics;  Laterality: Left;    MEDICATIONS: Current Outpatient Medications on File Prior to Visit  Medication Sig Dispense Refill   acetaminophen (TYLENOL) 500 MG tablet Take 500 mg by mouth as needed.     acetaminophen (TYLENOL) 650 MG CR tablet Take 650 mg by mouth every 8 (eight) hours as needed for pain.     amLODipine (NORVASC) 5 MG tablet TAKE 1 TABLET BY MOUTH DAILY. 90 tablet 1   ascorbic acid (VITAMIN C) 500 MG tablet Take 500 mg by mouth daily.     buPROPion (WELLBUTRIN XL) 150 MG 24 hr tablet Take 1 tablet (150 mg total) by mouth daily.      carvedilol (COREG) 3.125 MG tablet TAKE 1 TABLET BY MOUTH  TWICE DAILY 180 tablet 3   Cholecalciferol (VITAMIN D3) 50 MCG (2000 UT) TABS Take 2,000 Units by mouth daily.      diclofenac Sodium (VOLTAREN) 1 % GEL Apply 4 g topically 4 (four) times daily. 100 g 2   fluticasone (FLONASE) 50 MCG/ACT nasal spray Place 2 sprays into both nostrils daily. 16 g 3   furosemide (LASIX) 20 MG tablet TAKE 1 TABLET BY MOUTH  DAILY 90 tablet 3   levothyroxine (SYNTHROID, LEVOTHROID) 100 MCG tablet Take 1 tablet (100 mcg total) by mouth daily before breakfast. 90 tablet 3   Lidocaine 4 % PTCH Apply 1 patch topically every 12 (twelve) hours. Apply to back     Magnesium 250 MG TABS Take 250 mg by mouth daily.     meclizine (ANTIVERT) 12.5 MG tablet  Take 1 tablet (12.5 mg total) by mouth 3 (three) times daily as needed for dizziness. 30 tablet 1   methocarbamol (ROBAXIN) 500 MG tablet Take 1 tablet (500 mg total) by mouth every 8 (eight) hours as needed. 30 tablet 3   mirabegron ER (MYRBETRIQ) 50 MG TB24 tablet Take 1 tablet (50 mg total) by mouth daily. 30 tablet 3   NONFORMULARY OR COMPOUNDED ITEM Apply 120 Tubes topically daily. Diclofenac/Cyclobenzaprine/lamotrigine/lidocaine/prilocaine (10480) 2%/2%/6%/5%/1.25% cream QTY: 120 GM SIG: (NEURO) APPLY 1-2 PUMPS (1-2 GMS) TO AFFECTED AREA (S) OR FOCAL POINTS 3 TO 4 TIMES DAILY. RUB IN FOR 2 MINUTES TO ACHIEVE MAX PENETRATION. [place] HANDS WELL. 1 each 2   Omega-3 Fatty Acids (FISH OIL PO) Take 1 capsule by mouth daily.     omeprazole (PRILOSEC) 40 MG capsule TAKE 1 CAPSULE BY MOUTH  ONCE DAILY FOR STOMACH 90 capsule 3   Oxcarbazepine (TRILEPTAL) 300 MG tablet Take 300 mg by mouth daily.      polyethylene glycol powder (GLYCOLAX/MIRALAX) 17 GM/SCOOP powder Take 1 Container by mouth as needed.     potassium chloride (KLOR-CON) 10 MEQ tablet TAKE 1 TABLET BY MOUTH  DAILY 90 tablet 3   senna-docusate (SENOKOT S) 8.6-50 MG tablet Take 2 tablets by mouth daily. 60 tablet 5    sertraline (ZOLOFT) 100 MG tablet Take 200 mg by mouth daily.      spironolactone (ALDACTONE) 25 MG tablet TAKE 1 TABLET BY MOUTH IN  THE MORNING 90 tablet 3   Vitamin E 400 units TABS Take 400 Units by mouth daily.      No current facility-administered medications on file prior to visit.    ALLERGIES: Allergies  Allergen Reactions   Iodine Shortness Of Breath    Iodine contrast, CHF , SOB   Shellfish Allergy Shortness Of Breath   Memantine     Malaise, fogginess/ couldn't think   Aspirin Nausea And Vomiting   Codeine Nausea And Vomiting    FAMILY HISTORY: Family History  Problem Relation Age of Onset   Hypertension Mother    Arthritis Mother    Hypertension Father    Hypertension Brother    Hypertension Brother     SOCIAL HISTORY: Social History   Socioeconomic History   Marital status: Married    Spouse name: Not on file   Number of children: 1   Years of education: Masters   Highest education level: Not on file  Occupational History   Occupation: Retired  Tobacco Use   Smoking status: Never   Smokeless tobacco: Never  Scientific laboratory technician Use: Never used  Substance and Sexual Activity   Alcohol use: No   Drug use: No   Sexual activity: Not Currently    Birth control/protection: Post-menopausal  Other Topics Concern   Not on file  Social History Narrative   Lives at home with husband.   Right-handed.      As of 07/28/2014   Diet: No special diet   Caffeine: yes, Chocolate, tea and sodas    Married: YES, 1970   House: Yes, 2 stories, 2-3 persons live in home   Pets: No   Current/Past profession: Engineer, mining, Designer, jewellery    Exercise: Yes 2-3 x weekly   Living Will: Yes   DNR: No   POA/HPOA: No      Social Determinants of Radio broadcast assistant Strain: Not on file  Food Insecurity: Not on file  Transportation Needs: Not on file  Physical  Activity: Not on file  Stress: Not on file  Social Connections: Not on file  Intimate  Partner Violence: Not on file     PHYSICAL EXAM: Vitals:   10/13/20 1013  BP: 125/73  Pulse: 78  SpO2: 98%   General: No acute distress Head:  Normocephalic/atraumatic Skin/Extremities: No rash, no edema Neurological Exam: Mental status: alert and oriented to person, place, and time, no dysarthria or aphasia, Fund of knowledge is appropriate.  Recent and remote memory are impaired.  Attention and concentration are normal.    Able to name objects and repeat some phrases. [place] Cognitive Assessment  10/13/2020  Visuospatial/ Executive (0/5) 2  Naming (0/3) 3  Attention: Read list of digits (0/2) 2  Attention: Read list of letters (0/1) 1  Attention: Serial 7 subtraction starting at 100 (0/3) 2  Language: Repeat phrase (0/2) 1  Language : Fluency (0/1) 1  Abstraction (0/2) 1  Delayed Recall (0/5) 2  Orientation (0/6) 6  Total 21  Adjusted Score (based on education) 21    Cranial nerves: CN I: not tested CN II: pupils equal, round and reactive to light, visual fields intact CN III, IV, VI:  full range of motion, no nystagmus, no ptosis CN V: facial sensation intact CN VII: upper and lower face symmetric CN VIII: hearing intact to conversation CN IX, X: gag intact, uvula midline CN XI: sternocleidomastoid and trapezius muscles intact CN XII: tongue midline Bulk & Tone: normal, no fasciculations. Motor: 5/5 throughout with no pronator drift. Sensation: intact to light touch, cold, pin, vibration sense.  No extinction to double simultaneous stimulation.  Romberg test negative Deep Tendon Reflexes: +1 throughout Cerebellar: no incoordination on finger to nose testing Gait: unsteady, slow and cautious Tremor: no resting tremor, minimal postural tremor   IMPRESSION: This is an 87 year old right-handed woman with a history of hypertension, hyperlipidemia, hypothyroidism, LBBB s/p ICD placement, breast cancer s/p mastectomy, chronic back pain, bipolar depression,  anxiety, presenting for evaluation of memory loss. Her neurological exam is non-focal, gait today unsteady. MOCA score 21/30. We discussed the diagnosis of mild dementia, likely due to Alzheimer's disease. Head CT without contrast done 07/2019 which did not show any acute changes. There was mild chronic microvascular disease and mild diffuse atrophy that have slightly progressed compared to 2016 imaging. We discussed diagnosis, prognosis, management. She has tried several medications used to dementia, we discussed balancing side effects and potential benefits, at this point we agreed to hold off on restarting these medications. We discussed the importance of control of vascular risk factors, physical and brain stimulation exercises, MIND diet for overall brain health. She would benefit from physical therapy/balance therapy. Continue close supervision.She does not drive. Follow-up in 6 months, call for any changes.    Thank you for allowing me to participate in the care of this patient. Please do not hesitate to call for any questions or concerns.   Ellouise Newer, M.D.  CC: Stacey Mustache, NP

## 2020-10-13 NOTE — Patient Instructions (Signed)
Good to meet you!  Referral will be sent for home physical therapy/balance therapy  2. Follow-up in 6 months, call for any changes   FALL PRECAUTIONS: Be cautious when walking. Scan the area for obstacles that may increase the risk of trips and falls. When getting up in the mornings, sit up at the edge of the bed for a few minutes before getting out of bed. Consider elevating the bed at the head end to avoid drop of blood pressure when getting up. Walk always in a well-lit room (use night lights in the walls). Avoid area rugs or power cords from appliances in the middle of the walkways. Use a walker or a cane if necessary and consider physical therapy for balance exercise. Get your eyesight checked regularly.   HOME SAFETY: Consider the safety of the kitchen when operating appliances like stoves, microwave oven, and blender. Consider having supervision and share cooking responsibilities until no longer able to participate in those. Accidents with firearms and other hazards in the house should be identified and addressed as well.   ABILITY TO BE LEFT ALONE: If patient is unable to contact 911 operator, consider using LifeLine, or when the need is there, arrange for someone to stay with patients. Smoking is a fire hazard, consider supervision or cessation. Risk of wandering should be assessed by caregiver and if detected at any point, supervision and safe proof recommendations should be instituted.  MEDICATION SUPERVISION: Inability to self-administer medication needs to be constantly addressed. Implement a mechanism to ensure safe administration of the medications.  RECOMMENDATIONS FOR ALL PATIENTS WITH MEMORY PROBLEMS: 1. Continue to exercise (Recommend 30 minutes of walking everyday, or 3 hours every week) 2. Increase social interactions - continue going to [place] and enjoy social gatherings with friends and family 3. Eat healthy, avoid fried foods and eat more fruits and vegetables 4. Maintain  adequate blood pressure, blood sugar, and blood cholesterol level. Reducing the risk of stroke and cardiovascular disease also helps promoting better memory. 5. Avoid stressful situations. Live a simple life and avoid aggravations. Organize your time and prepare for the next day in anticipation. 6. Sleep well, avoid any interruptions of sleep and avoid any distractions in the bedroom that may interfere with adequate sleep quality 7. Avoid sugar, avoid sweets as there is a strong link between excessive sugar intake, diabetes, and cognitive impairment We discussed the Mediterranean diet, which has been shown to help patients reduce the risk of progressive memory disorders and reduces cardiovascular risk. This includes eating fish, eat fruits and green leafy vegetables, nuts like almonds and hazelnuts, walnuts, and also use olive oil. Avoid fast foods and fried foods as much as possible. Avoid sweets and sugar as sugar use has been linked to worsening of memory function.  There is always a concern of gradual progression of memory problems. If this is the case, then we may need to adjust level of care according to patient needs. Support, both to the patient and caregiver, should then be put into place.       Mediterranean Diet  Why follow it? Research shows. Those who follow the Mediterranean diet have a reduced risk of heart disease  The diet is associated with a reduced incidence of Parkinson's and Alzheimer's diseases People following the diet may have longer life expectancies and lower rates of chronic diseases  The Dietary Guidelines for Americans recommends the Mediterranean diet as an eating plan to promote health and prevent disease  What Is the Mediterranean Diet?  Healthy eating plan based on typical foods and recipes of Mediterranean-style cooking The diet is primarily a plant based diet; these foods should make up a majority of meals   Starches - Plant based foods should make up a  majority of meals - They are an important sources of vitamins, minerals, energy, antioxidants, and fiber - Choose whole grains, foods high in fiber and minimally processed items  - Typical grain sources include wheat, oats, barley, corn, brown rice, bulgar, farro, millet, polenta, couscous  - Various types of beans include chickpeas, lentils, fava beans, black beans, white beans   Fruits  Veggies - Large quantities of antioxidant rich fruits & veggies; 6 or more servings  - Vegetables can be eaten raw or lightly drizzled with oil and cooked  - Vegetables common to the traditional Mediterranean Diet include: artichokes, arugula, beets, broccoli, brussel sprouts, cabbage, carrots, celery, collard greens, cucumbers, eggplant, kale, leeks, lemons, lettuce, mushrooms, okra, onions, peas, peppers, potatoes, pumpkin, radishes, rutabaga, shallots, spinach, sweet potatoes, turnips, zucchini - Fruits common to the Mediterranean Diet include: apples, apricots, avocados, cherries, clementines, dates, figs, grapefruits, grapes, melons, nectarines, oranges, peaches, pears, pomegranates, strawberries, tangerines  Fats - Replace butter and margarine with healthy oils, such as olive oil, canola oil, and tahini  - Limit nuts to no more than a handful a day  - Nuts include walnuts, almonds, pecans, pistachios, pine nuts  - Limit or avoid candied, honey roasted or heavily salted nuts - Olives are central to the [employer] - can be eaten whole or used in a variety of dishes   Meats Protein - Limiting red meat: no more than a few times a month - When eating red meat: choose lean cuts and keep the portion to the size of deck of cards - Eggs: approx. 0 to 4 times a week  - Fish and lean poultry: at least 2 a week  - Healthy protein sources include, chicken, [place], lean beef, lamb - Increase intake of seafood such as tuna, salmon, trout, mackerel, shrimp, scallops - Avoid or limit high fat processed meats such  as sausage and bacon  Dairy - Include moderate amounts of low fat dairy products  - Focus on healthy dairy such as fat free yogurt, skim milk, low or reduced fat cheese - Limit dairy products higher in fat such as whole or 2% milk, cheese, ice cream  Alcohol - Moderate amounts of red wine is ok  - No more than 5 oz daily for women (all ages) and men older than age 77  - No more than 10 oz of wine daily for men younger than 65  Other - Limit sweets and other desserts  - Use herbs and spices instead of salt to flavor foods  - Herbs and spices common to the traditional Mediterranean Diet include: basil, bay leaves, chives, cloves, cumin, fennel, garlic, lavender, marjoram, mint, oregano, parsley, pepper, rosemary, sage, savory, sumac, tarragon, thyme   It's not just a diet, it's a lifestyle:  The Mediterranean diet includes lifestyle factors typical of those in the region  Foods, drinks and meals are best eaten with others and savored Daily physical activity is important for overall good health This could be strenuous exercise like running and aerobics This could also be more leisurely activities such as walking, housework, yard-work, or taking the stairs Moderation is the key; a balanced and healthy diet accommodates most foods and drinks Consider portion sizes and frequency of consumption of certain foods  Meal Ideas & Options:  Breakfast:  Whole wheat toast or whole wheat English muffins with peanut butter & hard boiled egg Steel cut oats topped with apples & cinnamon and skim milk  Fresh fruit: banana, strawberries, melon, berries, peaches  Smoothies: strawberries, bananas, greek yogurt, peanut butter Low fat greek yogurt with blueberries and granola  Egg white omelet with spinach and mushrooms Breakfast couscous: whole wheat couscous, apricots, skim milk, cranberries  Sandwiches:  Hummus and grilled vegetables (peppers, zucchini, squash) on whole wheat bread   Grilled chicken on  whole wheat pita with lettuce, tomatoes, cucumbers or tzatziki  [place] salad on whole wheat bread: tuna salad made with greek yogurt, olives, red peppers, capers, green onions Garlic rosemary lamb pita: lamb sauted with garlic, rosemary, salt & pepper; add lettuce, cucumber, greek yogurt to pita - flavor with lemon juice and black pepper  Seafood:  Mediterranean grilled salmon, seasoned with garlic, basil, parsley, lemon juice and black pepper Shrimp, lemon, and spinach whole-grain pasta salad made with low fat greek yogurt  Seared scallops with lemon orzo  Seared tuna steaks seasoned salt, pepper, coriander topped with tomato mixture of olives, tomatoes, olive oil, minced garlic, parsley, green onions and cappers  Meats:  Herbed greek chicken salad with kalamata olives, cucumber, feta  Red bell peppers stuffed with spinach, bulgur, lean ground beef (or lentils) & topped with feta   Kebabs: skewers of chicken, tomatoes, onions, zucchini, squash  [place] burgers: made with red onions, mint, dill, lemon juice, feta cheese topped with roasted red peppers Vegetarian Cucumber salad: cucumbers, artichoke hearts, celery, red onion, feta cheese, tossed in olive oil & lemon juice  Hummus and whole grain pita points with a greek salad (lettuce, tomato, feta, olives, cucumbers, red onion) Lentil soup with celery, carrots made with vegetable broth, garlic, salt and pepper  Tabouli salad: parsley, bulgur, mint, scallions, cucumbers, tomato, radishes, lemon juice, olive oil, salt and pepper.

## 2020-11-03 ENCOUNTER — Ambulatory Visit (INDEPENDENT_AMBULATORY_CARE_PROVIDER_SITE_OTHER): Payer: Medicare Other

## 2020-11-03 DIAGNOSIS — I428 Other cardiomyopathies: Secondary | ICD-10-CM | POA: Diagnosis not present

## 2020-11-03 LAB — CUP PACEART REMOTE DEVICE CHECK
Battery Remaining Longevity: 30 mo
Battery Remaining Percentage: 50 %
Brady Statistic RA Percent Paced: 0 %
Brady Statistic RV Percent Paced: 100 %
Date Time Interrogation Session: 20220728081100
HighPow Impedance: 49 Ohm
Implantable Lead Implant Date: 20080116
Implantable Lead Implant Date: 20080116
Implantable Lead Implant Date: 20080116
Implantable Lead Location: 753859
Implantable Lead Location: 753860
Implantable Lead Location: 753860
Implantable Lead Model: 157
Implantable Lead Model: 4469
Implantable Lead Model: 4555
Implantable Lead Serial Number: 136532
Implantable Lead Serial Number: 161542
Implantable Lead Serial Number: 473495
Implantable Pulse Generator Implant Date: 20140911
Lead Channel Impedance Value: 392 Ohm
Lead Channel Impedance Value: 545 Ohm
Lead Channel Impedance Value: 937 Ohm
Lead Channel Pacing Threshold Amplitude: 0.5 V
Lead Channel Pacing Threshold Amplitude: 0.6 V
Lead Channel Pacing Threshold Amplitude: 0.6 V
Lead Channel Pacing Threshold Pulse Width: 0.4 ms
Lead Channel Pacing Threshold Pulse Width: 0.4 ms
Lead Channel Pacing Threshold Pulse Width: 0.8 ms
Lead Channel Setting Pacing Amplitude: 2 V
Lead Channel Setting Pacing Amplitude: 2 V
Lead Channel Setting Pacing Amplitude: 2.4 V
Lead Channel Setting Pacing Pulse Width: 0.4 ms
Lead Channel Setting Pacing Pulse Width: 0.8 ms
Lead Channel Setting Sensing Sensitivity: 0.6 mV
Lead Channel Setting Sensing Sensitivity: 1 mV
Pulse Gen Serial Number: 111765

## 2020-11-10 ENCOUNTER — Ambulatory Visit: Payer: Medicare Other

## 2020-11-10 ENCOUNTER — Other Ambulatory Visit: Payer: Self-pay

## 2020-11-10 ENCOUNTER — Ambulatory Visit (INDEPENDENT_AMBULATORY_CARE_PROVIDER_SITE_OTHER): Payer: Medicare Other

## 2020-11-10 DIAGNOSIS — M810 Age-related osteoporosis without current pathological fracture: Secondary | ICD-10-CM

## 2020-11-10 MED ORDER — DENOSUMAB 60 MG/ML ~~LOC~~ SOSY
60.0000 mg | PREFILLED_SYRINGE | Freq: Once | SUBCUTANEOUS | Status: AC
  Administered 2020-11-10: 60 mg via SUBCUTANEOUS

## 2020-11-11 ENCOUNTER — Telehealth: Payer: Self-pay

## 2020-11-11 NOTE — Telephone Encounter (Signed)
Verbal orders for OT given to [facility] phone number [phone]

## 2020-11-14 ENCOUNTER — Ambulatory Visit: Payer: Medicare Other | Admitting: Nurse Practitioner

## 2020-11-14 ENCOUNTER — Encounter: Payer: Self-pay | Admitting: Family

## 2020-11-14 ENCOUNTER — Ambulatory Visit (INDEPENDENT_AMBULATORY_CARE_PROVIDER_SITE_OTHER): Payer: Medicare Other | Admitting: Family

## 2020-11-14 ENCOUNTER — Other Ambulatory Visit: Payer: Self-pay

## 2020-11-14 VITALS — BP 116/70 | HR 66 | Temp 97.8°F | Resp 16 | Ht 59.0 in | Wt 118.2 lb

## 2020-11-14 DIAGNOSIS — E034 Atrophy of thyroid (acquired): Secondary | ICD-10-CM

## 2020-11-14 DIAGNOSIS — M545 Low back pain, unspecified: Secondary | ICD-10-CM

## 2020-11-14 DIAGNOSIS — I70213 Atherosclerosis of native arteries of extremities with intermittent claudication, bilateral legs: Secondary | ICD-10-CM

## 2020-11-14 DIAGNOSIS — F419 Anxiety disorder, unspecified: Secondary | ICD-10-CM

## 2020-11-14 DIAGNOSIS — F32A Depression, unspecified: Secondary | ICD-10-CM

## 2020-11-14 DIAGNOSIS — E782 Mixed hyperlipidemia: Secondary | ICD-10-CM

## 2020-11-14 DIAGNOSIS — I10 Essential (primary) hypertension: Secondary | ICD-10-CM | POA: Diagnosis not present

## 2020-11-14 DIAGNOSIS — F0390 Unspecified dementia without behavioral disturbance: Secondary | ICD-10-CM

## 2020-11-14 DIAGNOSIS — M79605 Pain in left leg: Secondary | ICD-10-CM

## 2020-11-14 MED ORDER — AMLODIPINE BESYLATE 5 MG PO TABS: 2.5000 mg | ORAL_TABLET | Freq: Every day | ORAL | 1 refills | Status: DC

## 2020-11-14 MED ORDER — METHOCARBAMOL 500 MG PO TABS: 500.0000 mg | ORAL_TABLET | Freq: Three times a day (TID) | ORAL | 1 refills | Status: DC | PRN

## 2020-11-14 NOTE — Progress Notes (Signed)
Provider: Eara Burruel FNP-C   [name], NP  Patient Care Team: [name], NP as PCP - General (Geriatric Medicine) [name], MD as Consulting Physician (General Surgery) Paralee Cancel, MD as Consulting Physician (Orthopedic Surgery) Marica Otter, [place] (Optometry) Marcial Pacas, MD as Consulting Physician (Neurology) [name], MD as Consulting Physician (Cardiology) Ricard Dillon, MD (Psychiatry)  Extended Emergency Contact Information Primary Emergency Contact: Pa[place], [place] Home Phone: [phone] Relation: Daughter Secondary Emergency Contact: [facility] Address: [address]          [place], [place] [address] Johnnette Litter of [place] Phone: [phone] Relation: Friend  Code Status:  DNR  Goals of care: Advanced Directive information Advanced Directives 11/14/2020  Does Patient Have a Medical Advance Directive? Yes  Type of Advance Directive Living will;Out of facility DNR (pink MOST or yellow form)  Does patient want to make changes to medical advance directive? No - Patient declined  Copy of [place] in Chart? -  Would patient like information on creating a medical advance directive? -  Pre-existing out of facility DNR order (yellow form or pink MOST form) -     Chief Complaint  Patient presents with   Medical Management of Chronic Issues    3 month follow up.   Immunizations    Discuss the need for shingrix vaccine, influenza vaccine, and 2nd covid booster.     HPI:  Pt is a 87 y.o. female seen today for 3 months follow up for medical management of chronic diseases.Has a medical history of Hypertension,Hyperlipidemia,Hypothyroidism,Anxiety,Bipolar depression ,Arteriosclerosis of abdominal aorta ,Dementia without behavioral disturbance ,congestive Heat Failure,GERD,LBBB s/p ICD,HOH,lumbar spondylosis/radiculopathy,breast cancer post mastectomy among other conditions. She follows up with Orthopedic  Dr.Ramos for lumbar spondylosis/radiculopathy. Also sees Neurologist for dementia last seen 10/13/2020 Head CT scan 07/2019 showed no acute changes.Physical brain stimulation exercises and mind diet for overall brain health was recommended.she was advised to follow up in 6 months.   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 Chart reviewed patient follows up with Dr.Dewald Roderic [place] message note CT scan consistent with possible sarcoidosis findings likely have been there for some time.patient was advised to follow up in 3 months with PFT's. I've advised patient's daughter to discuss concerns with Dr.Dewald. Daughter to call due to patient's cognition.   Due for Shingrix vaccine advised to get vaccine at the pharmacy   Past Medical History:  Diagnosis Date   Arthritis    Cancer [facility])    left breast cancer    Cataracts, bilateral    removed by surgery   CHF (congestive heart failure) ([place])    PACEMAKER & DEFIB   Complication of anesthesia    hypotensive after back surgery in 2006   Depression    Dyslipidemia    Fainted 04/21/06   AT [facility]   GERD (gastroesophageal reflux disease)    Headache(784.0)    Hearing loss    bilateral hearing aids   HLD (hyperlipidemia)    diet controlled    Hypertension    Hypothyroidism    ICD (implantable cardiac defibrillator) in place    pt has pacer/icd  ICD (implantable cardiac defibrillator), biventricular, in situ    LBBB (left bundle branch block)    Memory loss    Nonischemic  cardiomyopathy ([place])    Normal coronary arteries    s/p cardiac cath 2007   Pacemaker    ICD [employer]   Syncope    Systolic CHF [facility])    Vertigo    Wears glasses    Past Surgical History:  Procedure Laterality Date   BACK SURGERY     lumbar fusion    BREAST LUMPECTOMY Left 2008   BREAST SURGERY  2000   LUMP REMOVAL. STAGE 1 CANCER   CARDIAC CATHETERIZATION     CATARACT EXTRACTION     COLONOSCOPY     EYE SURGERY     IMPLANTABLE CARDIOVERTER DEFIBRILLATOR GENERATOR CHANGE N/A 12/18/2012   Procedure: IMPLANTABLE CARDIOVERTER DEFIBRILLATOR GENERATOR CHANGE;  Surgeon: [name], MD;  Location: [facility] CATH LAB;  Service: Cardiovascular;  Laterality: N/A;   JOINT REPLACEMENT  06/14/01   right   LUMBAR FUSION  2006   MASS EXCISION  11/08/2011   Procedure: EXCISION MASS;  Surgeon: [name], MD;  Location: [facility] ORS;  Service: General;  Laterality: Left;  Excision Left Thigh Mass   MASTECTOMY W/ SENTINEL NODE BIOPSY Left 06/04/2019   Procedure: LEFT MASTECTOMY WITH SENTINEL LYMPH NODE BIOPSY;  Surgeon: [name], MD;  Location: [place];  Service: General;  Laterality: Left;   MASTECTOMY, PARTIAL  2008   GOT PACEMAKER AND DEFIB AT THAT TIME   PACEMAKER INSERTION  04/23/06   TOTAL KNEE ARTHROPLASTY  05/17/01   RIGHT KNEE   TOTAL KNEE ARTHROPLASTY Left 11/29/2014   Procedure: TOTAL LEFT KNEE ARTHROPLASTY;  Surgeon: Paralee Cancel, MD;  Location: [facility] ORS;  Service: Orthopedics;  Laterality: Left;    Allergies  Allergen Reactions   Iodine Shortness Of Breath    Iodine contrast, CHF , SOB   Shellfish Allergy Shortness Of Breath   Memantine     Malaise, fogginess/ couldn't think   Aspirin Nausea And Vomiting   Codeine Nausea And Vomiting    Allergies as of 11/14/2020       Reactions   Iodine Shortness Of Breath   Iodine contrast, CHF , SOB   Shellfish Allergy Shortness Of Breath   Memantine    Malaise, fogginess/ couldn't think   Aspirin Nausea And Vomiting   Codeine Nausea  And Vomiting        Medication List        Accurate as of November 14, 2020  9:50 PM. If you have any questions, ask your nurse or doctor.          acetaminophen 650 MG CR tablet Commonly known as: TYLENOL Take 650 mg by mouth every 8 (eight) hours as needed for pain.   acetaminophen 500 MG tablet Commonly known as: TYLENOL Take 500 mg by mouth as needed.   amLODipine 5 MG tablet Commonly known as: NORVASC Take 0.5 tablets (2.5 mg total) by mouth daily. What changed: Another medication with the same name was removed. Continue taking this medication, and follow the directions you see here. Changed by: Sandrea Hughs, NP   ascorbic acid 500 MG tablet Commonly known as: VITAMIN C Take 500 mg by mouth daily.   buPROPion 150 MG 24 hr tablet Commonly known as: WELLBUTRIN XL Take 1 tablet (150 mg total) by mouth daily.   carvedilol 3.125 MG tablet Commonly known as: COREG TAKE 1 TABLET BY MOUTH  TWICE DAILY  diclofenac Sodium 1 % Gel Commonly known as: VOLTAREN Apply 4 g topically 4 (four) times daily.   FISH OIL PO Take 1 capsule by mouth daily.   fluticasone 50 MCG/ACT nasal spray Commonly known as: FLONASE Place 2 sprays into both nostrils daily.   furosemide 20 MG tablet Commonly known as: LASIX TAKE 1 TABLET BY MOUTH  DAILY   levothyroxine 100 MCG tablet Commonly known as: SYNTHROID Take 1 tablet (100 mcg total) by mouth daily before breakfast.   Lidocaine 4 % Ptch Apply 1 patch topically every 12 (twelve) hours. Apply to back   Magnesium 250 MG Tabs Take 250 mg by mouth daily.   meclizine 12.5 MG tablet Commonly known as: ANTIVERT Take 1 tablet (12.5 mg total) by mouth 3 (three) times daily as needed for dizziness.   methocarbamol 500 MG tablet Commonly known as: ROBAXIN Take 1 tablet (500 mg total) by mouth every 8 (eight) hours as needed.   mirabegron ER 50 MG Tb24 tablet Commonly known as: Myrbetriq Take 1 tablet (50 mg total) by mouth  daily.   NONFORMULARY OR COMPOUNDED ITEM Apply 120 Tubes topically daily. Diclofenac/Cyclobenzaprine/lamotrigine/lidocaine/prilocaine (10480) 2%/2%/6%/5%/1.25% cream QTY: 120 GM SIG: (NEURO) APPLY 1-2 PUMPS (1-2 GMS) TO AFFECTED AREA (S) OR FOCAL POINTS 3 TO 4 TIMES DAILY. RUB IN FOR 2 MINUTES TO ACHIEVE MAX PENETRATION. [place] HANDS WELL.   omeprazole 40 MG capsule Commonly known as: PRILOSEC TAKE 1 CAPSULE BY MOUTH  ONCE DAILY FOR STOMACH   Oxcarbazepine 300 MG tablet Commonly known as: TRILEPTAL Take 300 mg by mouth daily.   polyethylene glycol powder 17 GM/SCOOP powder Commonly known as: GLYCOLAX/MIRALAX Take 1 Container by mouth as needed.   potassium chloride 10 MEQ tablet Commonly known as: KLOR-CON TAKE 1 TABLET BY MOUTH  DAILY   senna-docusate 8.6-50 MG tablet Commonly known as: Senokot S Take 2 tablets by mouth daily.   sertraline 100 MG tablet Commonly known as: ZOLOFT Take 200 mg by mouth daily.   spironolactone 25 MG tablet Commonly known as: ALDACTONE TAKE 1 TABLET BY MOUTH IN  THE MORNING   Vitamin D3 50 MCG (2000 UT) Tabs Take 2,000 Units by mouth daily.   Vitamin E 400 units Tabs Take 400 Units by mouth daily.        Review of Systems  Constitutional:  Negative for appetite change, chills, fatigue, fever and unexpected weight change.  HENT:  Positive for hearing loss. Negative for congestion, dental problem, ear discharge, ear pain, facial swelling, nosebleeds, postnasal drip, rhinorrhea, sinus pressure, sinus pain, sneezing, sore throat, tinnitus and trouble swallowing.   Eyes:  Negative for pain, discharge, redness, itching and visual disturbance.  Respiratory:  Negative for cough, chest tightness, shortness of breath and wheezing.   Cardiovascular:  Negative for chest pain, palpitations and leg swelling.  Gastrointestinal:  Negative for abdominal distention, abdominal pain, blood in stool, constipation, diarrhea, nausea and vomiting.  Endocrine:  Negative for cold intolerance, heat intolerance, polydipsia, polyphagia and polyuria.  Genitourinary:  Negative for difficulty urinating, dysuria, flank pain, frequency and urgency.  Musculoskeletal:  Positive for arthralgias, back pain and gait problem. Negative for joint swelling, myalgias, neck pain and neck stiffness.  Skin:  Negative for color change, pallor, rash and wound.  Neurological:  Negative for dizziness, syncope, speech difficulty, weakness, light-headedness, numbness and headaches.  Hematological:  Does not bruise/bleed easily.  Psychiatric/Behavioral:  Negative for agitation, behavioral problems, confusion, hallucinations and sleep disturbance. The patient is not nervous/anxious.    Immunization  History  Administered Date(s) Administered   Fluad Quad(high Dose 65+) 01/28/2020   Influenza, High Dose Seasonal PF 01/16/2018, 12/08/2018   Influenza-Unspecified 12/08/2013, 01/10/2015, 12/09/2015, 01/12/2017   PFIZER(Purple Top)SARS-COV-2 Vaccination 05/23/2019, 06/15/2019, 12/23/2019, 08/30/2020   Pneumococcal Conjugate-13 04/09/2012   Pneumococcal Polysaccharide-23 02/28/2016   Tdap 01/07/2013   Zoster, Live 08/05/2014   Pertinent  Health Maintenance Due  Topic Date Due   INFLUENZA VACCINE  11/07/2020   MAMMOGRAM  03/18/2021   DEXA SCAN  Completed   PNA vac Low Risk Adult  Completed   Fall Risk  11/14/2020 09/28/2020 08/12/2020 07/15/2020 02/15/2020  Falls in the past year? 0 0 0 1 0  Number falls in past yr: 0 0 0 1 0  Injury with Fall? 0 0 0 0 0  Comment - - - - -  Risk for fall due to : No Fall Risks - - History of fall(s) -  Follow up Falls evaluation completed - - - -   Functional Status Survey:    Vitals:   11/14/20 1029  BP: 116/70  Pulse: 66  Resp: 16  Temp: 97.8 F (36.6 C)  SpO2: 97%  Weight: 118 lb 3.2 oz (53.6 kg)  Height: 4' 11" (1.499 m)   Body mass index is 23.87 kg/m. Physical Exam Vitals reviewed.  Constitutional:      General: She is not in  acute distress.    Appearance: Normal appearance. She is normal weight. She is not ill-appearing or diaphoretic.  HENT:     Head: Normocephalic.     Right Ear: Tympanic membrane, ear canal and external ear normal. There is no impacted cerumen.     Left Ear: Tympanic membrane, ear canal and external ear normal. There is no impacted cerumen.     Nose: Nose normal. No congestion or rhinorrhea.     Mouth/Throat:     Mouth: Mucous membranes are moist.     Pharynx: Oropharynx is clear. No oropharyngeal exudate or posterior oropharyngeal erythema.  Eyes:     General: No scleral icterus.       Right eye: No discharge.        Left eye: No discharge.     Extraocular Movements: Extraocular movements intact.     Conjunctiva/sclera: Conjunctivae normal.     Pupils: Pupils are equal, round, and reactive to light.  Neck:     Vascular: No carotid bruit.  Cardiovascular:     Rate and Rhythm: Normal rate and regular rhythm.     Pulses: Normal pulses.     Heart sounds: Normal heart sounds. No murmur heard.   No friction rub. No gallop.  Pulmonary:     Effort: Pulmonary effort is normal. No respiratory distress.     Breath sounds: Normal breath sounds. No wheezing, rhonchi or rales.  Chest:     Chest wall: No tenderness.  Abdominal:     General: Bowel sounds are normal. There is no distension.     Palpations: Abdomen is soft. There is no mass.     Tenderness: There is no abdominal tenderness. There is no right CVA tenderness, left CVA tenderness, guarding or rebound.  Musculoskeletal:        General: No swelling or tenderness. Normal range of motion.     Cervical back: Normal range of motion. No rigidity or tenderness.     Right lower leg: No edema.     Left lower leg: No edema.  Lymphadenopathy:     Cervical: No cervical adenopathy.  Skin:  General: Skin is warm and dry.     Coloration: Skin is not pale.     Findings: No bruising, erythema, lesion or rash.  Neurological:     Mental  Status: She is alert and oriented to person, place, and time.     Cranial Nerves: No cranial nerve deficit.     Sensory: No sensory deficit.     Motor: No weakness.     Coordination: Coordination normal.     Gait: Gait normal.  Psychiatric:        Mood and Affect: Mood normal.        Speech: Speech normal.        Behavior: Behavior normal.        Thought Content: Thought content normal.        Judgment: Judgment normal.    Labs reviewed: Recent Labs    06/20/20 0944 09/18/20 1210 09/28/20 1100  NA 140 139 139  K 4.7 3.8 4.4  CL 104 103 102  CO2 _0 GLUCOSE 98 96 134*  BUN 16 21 38*  CREATININE 0.98 0.64 0.97*  CALCIUM 10.1 9.9 10.8*   Recent Labs    12/23/19 0859 01/28/20 0829 06/20/20 0944 09/18/20 1210  AST _1 ALT _2 ALKPHOS 69  --  68 48  BILITOT 0.3 0.4 0.3 0.4  PROT 7.6 7.2 7.8 7.1  ALBUMIN 3.7  --  4.0 4.0   Recent Labs    06/20/20 0944 09/18/20 1210 09/28/20 1100  WBC 7.8 8.0 9.7  NEUTROABS 5.2 5.2 6,712  HGB 13.7 12.5 14.0  HCT 41.2 37.9 42.7  MCV 86.0 87.3 87.9  PLT 124* 125* 135*   Lab Results  Component Value Date   TSH 1.52 07/17/2019   No results found for: HGBA1C Lab Results  Component Value Date   CHOL 182 11/10/2018   HDL 60 11/10/2018   LDLCALC 104 (H) 11/10/2018   TRIG 88 11/10/2018   CHOLHDL 3.0 11/10/2018    Significant Diagnostic Results in last 30 days:  CUP PACEART REMOTE DEVICE CHECK  Result Date: 11/03/2020 Scheduled remote reviewed. Normal device function.  4 ATR's AT with 1:1 AV conduction, 1-3sec. Next remote 91 days. LR   Assessment/Plan 1. Low back pain radiating to left lower extremity Continue to follow up with Orthopedic  Continue current pain regimen  - methocarbamol (ROBAXIN) 500 MG tablet; Take 1 tablet (500 mg total) by mouth every 8 (eight) hours as needed.  Dispense: 90 tablet; Refill: 1  2. Essential hypertension B/p well controlled  Continue on amlodipine ,carvedilol,and  Furosemide and spironolactone   - amLODipine (NORVASC) 5 MG tablet; Take 0.5 tablets (2.5 mg total) by mouth daily.  Dispense: 90 tablet; Refill: 1 - CBC with Differential/Platelet; Future - BMP with eGFR([facility]); Future - TSH; Future  3. Atherosclerosis of native artery of both lower extremities with intermittent claudication (HCC) Allergic to ASA and has taken Statin in the past which she states did not tolerated well. Continue to control high risk factors   4. Anxiety and depression Mood stable. Continue on Bupropion and Zoloft  5. Dementia without behavioral disturbance, unspecified dementia type ([place]) No new behavorial issues  Continue on Bupropion and Zoloft  - continue with supportive care    6. Mixed hyperlipidemia Intolerance to statin Latest LDL not at goal  - continue on Omega-3 fatty acid   - Lipid panel; Future  7. Hypothyroidism due to acquired atrophy of thyroid  Lab Results  Component Value Date   TSH 1.52 07/17/2019  - continue on levothyroxine on an empty stomach  - TSH; Future  Family/ staff Communication: Reviewed plan of care with patient and daughter verbalized understanding   Labs/tests ordered:  - CBC with Differential/Platelet - CMP with eGFR([facility]) - TSH - Hgb A1C - Lipid panel  Next Appointment : 6 months for medical management of chronic issues.Fasting Labs prior  in one week or sooner.    Sandrea Hughs, NP

## 2020-11-15 NOTE — Telephone Encounter (Signed)
This message was received for you this morning.    This message is being sent by Juel Burrow on behalf of [facility].   Hello Dr. Erin Fulling, My name is Estoria Dauer, the daughter of [name]. I still have concerns about the CT Chest High Resolution that was taken on June 3. 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 She said that you called her and told her that the scarring was nothing to worry about. Any clarification directly from you about these results would be greatly appreciated. My number is [phone] in case it is more convenient to correspond over the phone.   Message routed to Dr. Erin Fulling

## 2020-11-17 ENCOUNTER — Other Ambulatory Visit: Payer: Medicare Other

## 2020-11-17 ENCOUNTER — Other Ambulatory Visit: Payer: Self-pay

## 2020-11-17 DIAGNOSIS — I10 Essential (primary) hypertension: Secondary | ICD-10-CM

## 2020-11-17 DIAGNOSIS — E034 Atrophy of thyroid (acquired): Secondary | ICD-10-CM

## 2020-11-17 DIAGNOSIS — E782 Mixed hyperlipidemia: Secondary | ICD-10-CM

## 2020-11-18 LAB — BASIC METABOLIC PANEL WITH GFR
BUN: 24 mg/dL (ref 7–25)
CO2: 28 mmol/L (ref 20–32)
Calcium: 9.8 mg/dL (ref 8.6–10.4)
Chloride: 104 mmol/L (ref 98–110)
Creat: 0.7 mg/dL (ref 0.60–0.95)
Glucose, Bld: 95 mg/dL (ref 65–99)
Potassium: 4.4 mmol/L (ref 3.5–5.3)
Sodium: 139 mmol/L (ref 135–146)
eGFR: 85 mL/min/{1.73_m2} (ref >=60)

## 2020-11-18 LAB — LIPID PANEL
Cholesterol: 171 mg/dL (ref <200)
HDL: 63 mg/dL (ref >=50)
LDL Cholesterol (Calc): 90 mg/dL (calc)
Non-HDL Cholesterol (Calc): 108 mg/dL (calc) (ref <130)
Total CHOL/HDL Ratio: 2.7 (calc) (ref <5.0)
Triglycerides: 86 mg/dL (ref <150)

## 2020-11-18 LAB — CBC WITH DIFFERENTIAL/PLATELET
Absolute Monocytes: 638 cells/uL (ref 200–950)
Basophils Absolute: 68 cells/uL (ref 0–200)
Basophils Relative: 1.2 %
Eosinophils Absolute: 239 cells/uL (ref 15–500)
Eosinophils Relative: 4.2 %
HCT: 39.6 % (ref 35.0–45.0)
Hemoglobin: 12.7 g/dL (ref 11.7–15.5)
Lymphs Abs: 1254 cells/uL (ref 850–3900)
MCH: 28.5 pg (ref 27.0–33.0)
MCHC: 32.1 g/dL (ref 32.0–36.0)
MCV: 89 fL (ref 80.0–100.0)
MPV: 12.1 fL (ref 7.5–12.5)
Monocytes Relative: 11.2 %
Neutro Abs: 3500 cells/uL (ref 1500–7800)
Neutrophils Relative %: 61.4 %
Platelets: 128 10*3/uL — ABNORMAL LOW (ref 140–400)
RBC: 4.45 10*6/uL (ref 3.80–5.10)
RDW: 14.2 % (ref 11.0–15.0)
Total Lymphocyte: 22 %
WBC: 5.7 10*3/uL (ref 3.8–10.8)

## 2020-11-18 LAB — TSH: TSH: 0.67 mIU/L (ref 0.40–4.50)

## 2020-11-29 NOTE — Progress Notes (Signed)
Remote ICD transmission.   

## 2020-12-01 NOTE — Telephone Encounter (Signed)
I have called and left a voicemail to get patient scheduled. Gracy Bruins patient's daughter to please call the office at [phone] to get patient scheduled for an appointment. Nothing further needed at this time.

## 2020-12-09 ENCOUNTER — Telehealth: Payer: Self-pay | Admitting: Neurology

## 2020-12-09 NOTE — Telephone Encounter (Signed)
[facility] called with verbal orders for OT.

## 2020-12-09 NOTE — Telephone Encounter (Signed)
Yes, thanks

## 2020-12-09 NOTE — Telephone Encounter (Signed)
[facility] called and needs to speak with you about occupational therapy. She called and LM on VM. Said to call her at [phone]. She said it is a secure VM, you can leave a message on it if you need to.

## 2020-12-15 ENCOUNTER — Telehealth: Payer: Self-pay

## 2020-12-15 ENCOUNTER — Telehealth: Payer: Self-pay | Admitting: *Deleted

## 2020-12-15 MED ORDER — OMEPRAZOLE 20 MG PO CPDR: 40.0000 mg | DELAYED_RELEASE_CAPSULE | Freq: Every day | ORAL | 1 refills | Status: DC

## 2020-12-15 NOTE — Telephone Encounter (Signed)
[name] with [facility] called and stated that patient's Omeprazole '40mg'$  is OUT OF STOCK long term.  They are needing a new Rx sent in for Omeprazole '20mg'$  Take TWO tablets daily.   Stated that the patient is aware and agrees to this.   Please Advise.

## 2020-12-15 NOTE — Telephone Encounter (Signed)
New order sent.

## 2020-12-15 NOTE — Telephone Encounter (Signed)
The patient wants to know if she can use a heating pad on the same shoulder as her ICD?

## 2020-12-15 NOTE — Telephone Encounter (Signed)
Spoke with patient regarding need for heating pad. [name] states her shoulder is aching, not a new finding, and she would like to utilize heat therapy for discomfort. Informed [name] she could use heating pad 6 inches from device, on shoulder joint or back if needed. Patient thankful for information. Suggested patient contact PCP for additional recommendations for shoulder discomfort.

## 2020-12-16 ENCOUNTER — Telehealth: Payer: Self-pay

## 2020-12-16 NOTE — Telephone Encounter (Signed)
Verbal orders for therapy 1 time a week for 6 weeks to start next week given to [facility] home [facility],

## 2020-12-21 ENCOUNTER — Inpatient Hospital Stay (HOSPITAL_BASED_OUTPATIENT_CLINIC_OR_DEPARTMENT_OTHER): Payer: Medicare Other | Admitting: Hematology

## 2020-12-21 ENCOUNTER — Inpatient Hospital Stay: Payer: Medicare Other | Attending: Hematology

## 2020-12-21 ENCOUNTER — Other Ambulatory Visit: Payer: Self-pay

## 2020-12-21 VITALS — BP 119/74 | HR 74 | Temp 97.7°F | Resp 17 | Ht 59.0 in | Wt 117.5 lb

## 2020-12-21 DIAGNOSIS — E039 Hypothyroidism, unspecified: Secondary | ICD-10-CM | POA: Insufficient documentation

## 2020-12-21 DIAGNOSIS — C50912 Malignant neoplasm of unspecified site of left female breast: Secondary | ICD-10-CM | POA: Diagnosis not present

## 2020-12-21 DIAGNOSIS — I972 Postmastectomy lymphedema syndrome: Secondary | ICD-10-CM | POA: Insufficient documentation

## 2020-12-21 DIAGNOSIS — Z171 Estrogen receptor negative status [ER-]: Secondary | ICD-10-CM | POA: Diagnosis not present

## 2020-12-21 DIAGNOSIS — I110 Hypertensive heart disease with heart failure: Secondary | ICD-10-CM | POA: Insufficient documentation

## 2020-12-21 DIAGNOSIS — Z853 Personal history of malignant neoplasm of breast: Secondary | ICD-10-CM | POA: Insufficient documentation

## 2020-12-21 DIAGNOSIS — I5022 Chronic systolic (congestive) heart failure: Secondary | ICD-10-CM | POA: Insufficient documentation

## 2020-12-21 DIAGNOSIS — Z9012 Acquired absence of left breast and nipple: Secondary | ICD-10-CM | POA: Insufficient documentation

## 2020-12-21 DIAGNOSIS — Z79899 Other long term (current) drug therapy: Secondary | ICD-10-CM | POA: Insufficient documentation

## 2020-12-21 LAB — CMP (CANCER CENTER ONLY)
ALT: 21 U/L (ref 0–44)
AST: 24 U/L (ref 15–41)
Albumin: 3.9 g/dL (ref 3.5–5.0)
Alkaline Phosphatase: 53 U/L (ref 38–126)
Anion gap: 8 (ref 5–15)
BUN: 21 mg/dL (ref 8–23)
CO2: 29 mmol/L (ref 22–32)
Calcium: 10.6 mg/dL — ABNORMAL HIGH (ref 8.9–10.3)
Chloride: 104 mmol/L (ref 98–111)
Creatinine: 0.85 mg/dL (ref 0.44–1.00)
GFR, Estimated: >60 mL/min (ref >60)
Glucose, Bld: 107 mg/dL — ABNORMAL HIGH (ref 70–99)
Potassium: 4.5 mmol/L (ref 3.5–5.1)
Sodium: 141 mmol/L (ref 135–145)
Total Bilirubin: 0.3 mg/dL (ref 0.3–1.2)
Total Protein: 7.3 g/dL (ref 6.5–8.1)

## 2020-12-21 LAB — CBC WITH DIFFERENTIAL/PLATELET
Abs Immature Granulocytes: 0.03 10*3/uL (ref 0.00–0.07)
Basophils Absolute: 0.1 10*3/uL (ref 0.0–0.1)
Basophils Relative: 1 %
Eosinophils Absolute: 0.2 10*3/uL (ref 0.0–0.5)
Eosinophils Relative: 3 %
HCT: 37.9 % (ref 36.0–46.0)
Hemoglobin: 12.8 g/dL (ref 12.0–15.0)
Immature Granulocytes: 0 %
Lymphocytes Relative: 15 %
Lymphs Abs: 1.3 10*3/uL (ref 0.7–4.0)
MCH: 29.5 pg (ref 26.0–34.0)
MCHC: 33.8 g/dL (ref 30.0–36.0)
MCV: 87.3 fL (ref 80.0–100.0)
Monocytes Absolute: 0.9 10*3/uL (ref 0.1–1.0)
Monocytes Relative: 11 %
Neutro Abs: 6.2 10*3/uL (ref 1.7–7.7)
Neutrophils Relative %: 70 %
Platelets: 141 10*3/uL — ABNORMAL LOW (ref 150–400)
RBC: 4.34 MIL/uL (ref 3.87–5.11)
RDW: 14.9 % (ref 11.5–15.5)
WBC: 8.8 10*3/uL (ref 4.0–10.5)
nRBC: 0 % (ref 0.0–0.2)

## 2020-12-27 NOTE — Progress Notes (Signed)
HEMATOLOGY/ONCOLOGY CLINIC NOTE  Date of Service: 12/27/2020  Patient Care Team: [name], NP as PCP - General (Geriatric Medicine) [name], MD as Consulting Physician (General Surgery) Paralee Cancel, MD as Consulting Physician (Orthopedic Surgery) Marica Otter, [place] (Optometry) Marcial Pacas, MD as Consulting Physician (Neurology) [name], MD as Consulting Physician (Cardiology) Ricard Dillon, MD (Psychiatry)  CHIEF COMPLAINTS/PURPOSE OF CONSULTATION:   F/u Breast cancer  HISTORY OF PRESENTING ILLNESS:   [name] is a wonderful 87 y.o. female who has been referred to [place] by Sherrie Mustache, NP for evaluation and management of easily bruising. The pt reports that she is doing well overall.   The pt reports that she has seen bruises on her arm, and denies bumping into things or any trauma. The pt notes that her bruises are solely located to her upper extremities. She denies concerns for bleeding in her joints, blood in the stools, blood in the urine, nose bleeds or gum bleeds. She notes she has bruised easily for about 2 years. She also notes that she began taking Zoloft about two years ago, and fish oil 4-5 years ago. She is not on any blood thinners. She denies taking any other new medications in the last couple years. The pt denies any thick bruises at any time. The pt denies excessive bleeding with pervious surgeries and dental extractions. The pt denies heavy periods when she was younger.  She has had two knee replacements and took Tramadol for her pain. She denies needing to use this frequently, and takes Tylenol for mild pains.    The pt takes Vitamin E oil for hot flashes, and notes that her Vitamin E oil successfully resolved her hot flashes. She began taking Vitamin E about 18 months ago.   The pt notes that she has lost about 20 pounds over two years. The pt reports some constipation and denies difficulty swallowing, and weak appetite.  The pt denies any dietary restrictions. She continues annual mammograms. She has a history of left sided breast cancer treated with a lumpectomy and radiation.  She notes that her energy levels have also decreased in the last 6 months, and "feels tired all the time." She endorses feeling well rested after taking naps. She notes that she does feel depressed but that taking Zoloft keeps her "afloat." She notes that she feels "medium" enjoyment in her activities. She sees psychiatry every 6 months.  The pt lives with her husband.   Most recent lab results (02/05/18) of CBC w/diff and CMP is as follows: all values are WNL except for PLT at 117k, BUN at 30, Creatinine at 0.96.  On review of systems, pt reports some stable depression, easily bruising on upper extremities, some weight loss, and denies nose bleeds, gum bleeds, blood in the urine, blood in the stools, abdominal pains, leg swelling, and any other symptoms.   On Family Hx the pt denies bleeding disorders.   INTERVAL HISTORY:   [name] is a wonderful 87 y.o. female who is here for evaluation and management of Stage 1B high-grade triple neg breast cancer. The patient's last visit with [place] was on 12/23/2019. The pt reports that she is doing well overall. We are joined today by her daughter.  The pt had a mammogram on 03/18/2020 that revealed no evidence of malignancy.  Patient notes no new breast symptoms or new breast lumps that she has noticed.  She did have a high-resolution CT of the chest on  09/09/2020 which showed very extensive, clustered, confluent, somewhat ill-defined nodularity throughout the lungs, many nodules concentrated along the fissures and with a slight upper lobe predominance. There is some associated irregular peripheral interstitial opacity. 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 She does have a follow-up with the pulmonologist to evaluate this further.  CT abdomen was done  on 09/18/2020 for right lower quadrant abdominal pain and showed no acute abdominal or pelvic findings mass lesions or adenopathy.    Lab results today 12/21/2020 of CBC w/diff WNL , CMP unremarkable except for mild hypercalcemia of 10.6 which is better than her previous read of 10.8.  This was thought to be possibly related to dehydration and had normalized in the interim.  On review of systems, pt reports no other acute new symptoms.  MEDICAL HISTORY:  Past Medical History:  Diagnosis Date   Arthritis    Cancer ([place])    left breast cancer    Cataracts, bilateral    removed by surgery   CHF (congestive heart failure) ([place])    PACEMAKER & DEFIB   Complication of anesthesia    hypotensive after back surgery in 2006   Depression    Dyslipidemia    Fainted 04/21/06   AT [facility]   GERD (gastroesophageal reflux disease)    Headache(784.0)    Hearing loss    bilateral hearing aids   HLD (hyperlipidemia)    diet controlled    Hypertension    Hypothyroidism    ICD (implantable cardiac defibrillator) in place    pt has pacer/icd   ICD (implantable cardiac defibrillator), biventricular, in situ    LBBB (left bundle branch block)    Memory loss    Nonischemic cardiomyopathy ([place])    Normal coronary arteries    s/p cardiac cath 2007   Pacemaker    ICD [employer]   Syncope    Systolic CHF [facility])    Vertigo    Wears glasses     SURGICAL HISTORY: Past Surgical History:  Procedure Laterality Date   BACK SURGERY     lumbar fusion    BREAST LUMPECTOMY Left 2008   BREAST SURGERY  2000   LUMP REMOVAL. STAGE 1 CANCER   CARDIAC CATHETERIZATION     CATARACT EXTRACTION     COLONOSCOPY     EYE SURGERY     IMPLANTABLE CARDIOVERTER DEFIBRILLATOR GENERATOR CHANGE N/A 12/18/2012   Procedure: IMPLANTABLE CARDIOVERTER DEFIBRILLATOR GENERATOR CHANGE;  Surgeon: [name], MD;  Location: [facility] CATH LAB;  Service: Cardiovascular;  Laterality: N/A;   JOINT REPLACEMENT  06/14/01   right    LUMBAR FUSION  2006   MASS EXCISION  11/08/2011   Procedure: EXCISION MASS;  Surgeon: [name], MD;  Location: [facility] OR[facility];  Service: General;  Laterality: Left;  Excision Left Thigh Mass   MASTECTOMY W/ SENTINEL NODE BIOPSY Left 06/04/2019   Procedure: LEFT MASTECTOMY WITH SENTINEL LYMPH NODE BIOPSY;  Surgeon: [name], MD;  Location: [place];  Service: General;  Laterality: Left;   MASTECTOMY, PARTIAL  2008   GOT PACEMAKER AND DEFIB AT THAT TIME   PACEMAKER INSERTION  04/23/06   TOTAL KNEE ARTHROPLASTY  05/17/01   RIGHT KNEE   TOTAL KNEE ARTHROPLASTY Left 11/29/2014   Procedure: TOTAL LEFT KNEE ARTHROPLASTY;  Surgeon: Paralee Cancel, MD;  Location: [facility];  Service: Orthopedics;  Laterality: Left;    SOCIAL HISTORY: Social History   Socioeconomic History   Marital status: Married    Spouse  name: Not on file   Number of children: 1   Years of education: Masters   Highest education level: Not on file  Occupational History   Occupation: Retired  Tobacco Use   Smoking status: Never   Smokeless tobacco: Never  Vaping Use   Vaping Use: Never used  Substance and Sexual Activity   Alcohol use: No   Drug use: No   Sexual activity: Not Currently    Birth control/protection: Post-menopausal  Other Topics Concern   Not on file  Social History Narrative   Lives at home with husband.   Right-handed.      As of 07/28/2014   Diet: No special diet   Caffeine: yes, Chocolate, tea and sodas    Married: YES, 1970   House: Yes, 2 stories, 2-3 persons live in home   Pets: No   Current/Past profession: Engineer, mining, Designer, jewellery    Exercise: Yes 2-3 x weekly   Living Will: Yes   DNR: No   POA/HPOA: No      Social Determinants of Radio broadcast assistant Strain: Not on file  Food Insecurity: Not on file  Transportation Needs: Not on file  Physical Activity: Not on file  Stress: Not on file  Social Connections: Not on file  Intimate Partner Violence: Not on file    FAMILY  HISTORY: Family History  Problem Relation Age of Onset   Hypertension Mother    Arthritis Mother    Hypertension Father    Hypertension Brother    Hypertension Brother     ALLERGIES:  is allergic to iodine, shellfish allergy, memantine, aspirin, and codeine.  MEDICATIONS:  Current Outpatient Medications  Medication Sig Dispense Refill   acetaminophen (TYLENOL) 500 MG tablet Take 500 mg by mouth as needed.     acetaminophen (TYLENOL) 650 MG CR tablet Take 650 mg by mouth every 8 (eight) hours as needed for pain.     amLODipine (NORVASC) 5 MG tablet Take 0.5 tablets (2.5 mg total) by mouth daily. 90 tablet 1   ascorbic acid (VITAMIN C) 500 MG tablet Take 500 mg by mouth daily.     buPROPion (WELLBUTRIN XL) 150 MG 24 hr tablet Take 1 tablet (150 mg total) by mouth daily.     carvedilol (COREG) 3.125 MG tablet TAKE 1 TABLET BY MOUTH  TWICE DAILY 180 tablet 3   Cholecalciferol (VITAMIN D3) 50 MCG (2000 UT) TABS Take 2,000 Units by mouth daily.      diclofenac Sodium (VOLTAREN) 1 % GEL Apply 4 g topically 4 (four) times daily. 100 g 2   fluticasone (FLONASE) 50 MCG/ACT nasal spray Place 2 sprays into both nostrils daily. 16 g 3   furosemide (LASIX) 20 MG tablet TAKE 1 TABLET BY MOUTH  DAILY 90 tablet 3   levothyroxine (SYNTHROID, LEVOTHROID) 100 MCG tablet Take 1 tablet (100 mcg total) by mouth daily before breakfast. 90 tablet 3   Lidocaine 4 % PTCH Apply 1 patch topically every 12 (twelve) hours. Apply to back     Magnesium 250 MG TABS Take 250 mg by mouth daily.     meclizine (ANTIVERT) 12.5 MG tablet Take 1 tablet (12.5 mg total) by mouth 3 (three) times daily as needed for dizziness. 30 tablet 1   methocarbamol (ROBAXIN) 500 MG tablet Take 1 tablet (500 mg total) by mouth every 8 (eight) hours as needed. 90 tablet 1   mirabegron ER (MYRBETRIQ) 50 MG TB24 tablet Take 1 tablet (50 mg total)  by mouth daily. 30 tablet 3   NONFORMULARY OR COMPOUNDED ITEM Apply 120 Tubes topically daily.  Diclofenac/Cyclobenzaprine/lamotrigine/lidocaine/prilocaine (10480) 2%/2%/6%/5%/1.25% cream QTY: 120 GM SIG: (NEURO) APPLY 1-2 PUMPS (1-2 GMS) TO AFFECTED AREA (S) OR FOCAL POINTS 3 TO 4 TIMES DAILY. RUB IN FOR 2 MINUTES TO ACHIEVE MAX PENETRATION. [place] HANDS WELL. 1 each 2   Omega-3 Fatty Acids (FISH OIL PO) Take 1 capsule by mouth daily.     omeprazole (PRILOSEC) 20 MG capsule Take 2 capsules (40 mg total) by mouth daily. 180 capsule 1   Oxcarbazepine (TRILEPTAL) 300 MG tablet Take 300 mg by mouth daily.      polyethylene glycol powder (GLYCOLAX/MIRALAX) 17 GM/SCOOP powder Take 1 Container by mouth as needed.     potassium chloride (KLOR-CON) 10 MEQ tablet TAKE 1 TABLET BY MOUTH  DAILY 90 tablet 3   senna-docusate (SENOKOT S) 8.6-50 MG tablet Take 2 tablets by mouth daily. 60 tablet 5   sertraline (ZOLOFT) 100 MG tablet Take 200 mg by mouth daily.      spironolactone (ALDACTONE) 25 MG tablet TAKE 1 TABLET BY MOUTH IN  THE MORNING 90 tablet 3   Vitamin E 400 units TABS Take 400 Units by mouth daily.      No current facility-administered medications for this visit.    REVIEW OF SYSTEMS:   .10 Point review of Systems was done is negative except as noted above.   PHYSICAL EXAMINATION  Vitals:   12/21/20 1016  BP: 119/74  Pulse: 74  Resp: 17  Temp: 97.7 F (36.5 C)  SpO2: 97%   Filed Weights   12/21/20 1016  Weight: 117 lb 8 oz (53.3 kg)   .Body mass index is 23.73 kg/m. [place] GENERAL:alert, in no acute distress and comfortable SKIN: no acute rashes, no significant lesions EYES: conjunctiva are pink and non-injected, sclera anicteric OROPHARYNX: MMM, no exudates, no oropharyngeal erythema or ulceration NECK: supple, no JVD LYMPH:  no palpable lymphadenopathy in the cervical, axillary or inguinal regions LUNGS: clear to auscultation b/l with normal respiratory effort HEART: regular rate & rhythm ABDOMEN:  normoactive bowel sounds , non tender, not distended. Extremity: no pedal  edema PSYCH: alert & oriented x 3 with fluent speech NEURO: no focal motor/sensory deficits   LABORATORY DATA:  I have reviewed the data as listed  . CBC Latest Ref Rng & Units 12/21/2020 11/17/2020 09/28/2020  WBC 4.0 - 10.5 K/uL 8.8 5.7 9.7  Hemoglobin 12.0 - 15.0 g/dL 12.8 12.7 14.0  Hematocrit 36.0 - 46.0 % 37.9 39.6 42.7  Platelets 150 - 400 K/uL 141(L) 128(L) 135(L)    . CMP Latest Ref Rng & Units 12/21/2020 11/17/2020 09/28/2020  Glucose 70 - 99 mg/dL 107(H) 95 134(H)  BUN 8 - 23 mg/dL 21 24 38(H)  Creatinine 0.44 - 1.00 mg/dL 0.85 0.70 0.97(H)  Sodium 135 - 145 mmol/L 141 139 139  Potassium 3.5 - 5.1 mmol/L 4.5 4.4 4.4  Chloride 98 - 111 mmol/L 104 104 102  CO2 22 - 32 mmol/L 29 28 27   Calcium 8.9 - 10.3 mg/dL 10.6(H) 9.8 10.8(H)  Total Protein 6.5 - 8.1 g/dL 7.3 - -  Total Bilirubin 0.3 - 1.2 mg/dL 0.3 - -  Alkaline Phos 38 - 126 U/L 53 - -  AST 15 - 41 U/L 24 - -  ALT 0 - 44 U/L 21 - -     03/18/2020 Mammogram   RADIOGRAPHIC STUDIES: I have personally reviewed the radiological images as listed and agreed  with the findings in the report.  ASSESSMENT & PLAN:   87 y.o. female with  1. H/o Easily bruising - labs did not demonstrate any specific bleeding diathesis 2. Stage IB ER/PR/Her 2 negative, grade 3 left breast cancer s/p mastectomy. NegSNLBx. 06/04/2019. Was not considered to be a good candidate for adjuvant chemotherapy given her age and medical issues. 3. LUE lymphedema-left breast mastectomy and lymph node biopsy.  Controlled with use of lymphedema sleeve.  Discussed maintaining compliance with use of her sleeve.  PLAN: -Discussed pt labwork today, 12/21/2020; blood counts normal, chemistries normal except calcium 10.6 -Recommended pt drink 48-64 oz water daily, more towards earlier part of the day.  -Advised pt to create a meal plan and eat regularly to avoid not eating. -Recommended pt wear arm sleeve continuously during day, even if asymptomatic at  time.  -Recommended pt wear compression socks on right leg. Start with less tight socks and work up if needed. This should help with decreasing frequent night urination. -Discussed [place] and pt's eligibility.  -No lab or clinical evidence of breast cancer progression at this time. Will continue to monitor. -Continue 2000 IU Vitamin D daily  -Recommended she take her annual flu shot and the new bivalent COVID-19 booster vaccine -Will see back in 6 months with labs.  2)  Patient Active Problem List   Diagnosis Date Noted   Dementia without behavioral disturbance ([place]) 02/04/2020   Urinary retention 02/04/2020   Seasonal allergies 02/04/2020   Recurrent major depressive disorder, in partial remission ([place]) 07/17/2019   Status post left mastectomy 07/01/2019   Breast cancer of lower-outer quadrant of left female breast ([place]) 06/04/2019   Candida infection, oral 01/15/2019   Senile purpura ([place]) 04/14/2018   Lumbar post-laminectomy syndrome 12/03/2017   Lumbar spondylosis 12/03/2017   History of back surgery 09/01/2017   Constipation 09/01/2017   Hypothyroidism due to acquired atrophy of thyroid 09/01/2017   Mixed hyperlipidemia 09/01/2017   Age-related osteoporosis without current pathological fracture 09/01/2017   High risk medication use 09/01/2017   Arthritis of hand 05/17/2017   Atherosclerosis of native arteries of extremity with intermittent claudication ([place]) 05/15/2017   Status post total bilateral knee replacement 12/20/2016   Gait abnormality 07/16/2016   Chronic low back pain 07/16/2016   Mild cognitive impairment 07/16/2016   Wrist pain 05/10/2015   S/P left TKA 11/29/2014   S/P knee replacement 11/29/2014   Spontaneous bruising 08/27/2014   Numbness and tingling in right hand 07/28/2014   Essential tremor 07/28/2014   Dizziness 05/28/2014   Shaky 05/28/2014   Memory loss 05/28/2014   Depression 05/06/2014   Lipoma of left upper thigh 3x5 cm 09/28/2011    Cerebral vascular accident ([place]) 08/09/2011   Syncope 08/09/2011   History of breast cancer [id number], s/p BCT 2008, triple negative 01/26/2011   Chronic L breast pain with chronic recurrent seroma, s/p excisional biopsy 01/12/2010 01/26/2011   Fainted    ICD (implantable cardioverter-defibrillator), biventricular, in situ 69/62/9528   Chronic systolic heart failure ([place]) 08/02/2010   Hypertension 08/02/2010   -continue f/u with PCP for mx of other chronic medical issues.  FOLLOW UP: RTC with Dr Irene Limbo with labs in 6 months   . The total time spent in the appointment was 20 minutes and more than 50% was on counseling and direct patient cares.   All of the patient's questions were answered with apparent satisfaction. The patient knows to call the clinic with any problems, questions or concerns.  Sullivan Lone MD [place] AAHIVMS [facility] Hematology/Oncology Physician [facility]

## 2021-01-09 ENCOUNTER — Other Ambulatory Visit: Payer: Self-pay

## 2021-01-09 ENCOUNTER — Encounter (HOSPITAL_COMMUNITY): Payer: Self-pay | Admitting: Emergency Medicine

## 2021-01-09 ENCOUNTER — Ambulatory Visit (HOSPITAL_COMMUNITY)
Admission: EM | Admit: 2021-01-09 | Discharge: 2021-01-09 | Disposition: A | Discharge status: Home / Self Care | Payer: Medicare Other | Attending: Emergency Medicine | Admitting: Emergency Medicine

## 2021-01-09 DIAGNOSIS — M5416 Radiculopathy, lumbar region: Secondary | ICD-10-CM | POA: Diagnosis not present

## 2021-01-09 MED ORDER — KETOROLAC TROMETHAMINE 30 MG/ML IJ SOLN
INTRAMUSCULAR | Status: AC
  Filled 2021-01-09: qty 1

## 2021-01-09 MED ORDER — METHYLPREDNISOLONE SODIUM SUCC 125 MG IJ SOLR
60.0000 mg | Freq: Once | INTRAMUSCULAR | Status: AC
  Administered 2021-01-09: 60 mg via INTRAMUSCULAR

## 2021-01-09 MED ORDER — KETOROLAC TROMETHAMINE 30 MG/ML IJ SOLN
30.0000 mg | Freq: Once | INTRAMUSCULAR | Status: AC
  Administered 2021-01-09: 30 mg via INTRAMUSCULAR

## 2021-01-09 MED ORDER — ONDANSETRON 4 MG PO TBDP
ORAL_TABLET | ORAL | Status: AC
  Filled 2021-01-09: qty 1

## 2021-01-09 MED ORDER — METHYLPREDNISOLONE SODIUM SUCC 125 MG IJ SOLR
INTRAMUSCULAR | Status: AC
  Filled 2021-01-09: qty 2

## 2021-01-09 MED ORDER — ONDANSETRON 4 MG PO TBDP
4.0000 mg | ORAL_TABLET | Freq: Once | ORAL | Status: AC
  Administered 2021-01-09: 4 mg via ORAL

## 2021-01-09 NOTE — ED Provider Notes (Signed)
[place]    CSN: [id number] Arrival date & time: 01/09/21  1005      History   Chief Complaint Chief Complaint  Patient presents with   Back Pain    HPI Naia Ruff Gallina is a 87 y.o. female.   Patient presents with mid lower back pain radiating down to bilateral feet present for 3 weeks.  Worsened when changing from sitting to standing position but can be felt at all times.  Denies numbness or tingling.  Has attempted use of Voltaren gel, lidocaine patches, Tylenol and muscle relaxer with no relief.  History of laminectomy.  Daughter present at bedside  Past Medical History:  Diagnosis Date   Arthritis    Cancer [facility])    left breast cancer    Cataracts, bilateral    removed by surgery   CHF (congestive heart failure) ([place])    PACEMAKER & DEFIB   Complication of anesthesia    hypotensive after back surgery in 2006   Depression    Dyslipidemia    Fainted 04/21/06   AT [facility]   GERD (gastroesophageal reflux disease)    Headache(784.0)    Hearing loss    bilateral hearing aids   HLD (hyperlipidemia)    diet controlled    Hypertension    Hypothyroidism    ICD (implantable cardiac defibrillator) in place    pt has pacer/icd   ICD (implantable cardiac defibrillator), biventricular, in situ    LBBB (left bundle branch block)    Memory loss    Nonischemic cardiomyopathy ([place])    Normal coronary arteries    s/p cardiac cath 2007   Pacemaker    ICD [facility] [employer]   Syncope    Systolic CHF [facility])    Vertigo    Wears glasses     Patient Active Problem List   Diagnosis Date Noted   Dementia without behavioral disturbance ([place]) 02/04/2020   Urinary retention 02/04/2020   Seasonal allergies 02/04/2020   Recurrent major depressive disorder, in partial remission ([place]) 07/17/2019   Status post left mastectomy 07/01/2019   Breast cancer of lower-outer quadrant of left female breast ([place]) 06/04/2019   Candida infection, oral 01/15/2019   Senile  purpura ([place]) 04/14/2018   Lumbar post-laminectomy syndrome 12/03/2017   Lumbar spondylosis 12/03/2017   History of back surgery 09/01/2017   Constipation 09/01/2017   Hypothyroidism due to acquired atrophy of thyroid 09/01/2017   Mixed hyperlipidemia 09/01/2017   Age-related osteoporosis without current pathological fracture 09/01/2017   High risk medication use 09/01/2017   Arthritis of hand 05/17/2017   Atherosclerosis of native arteries of extremity with intermittent claudication ([place]) 05/15/2017   Status post total bilateral knee replacement 12/20/2016   Gait abnormality 07/16/2016   Chronic low back pain 07/16/2016   Mild cognitive impairment 07/16/2016   Wrist pain 05/10/2015   S/P left TKA 11/29/2014   S/P knee replacement 11/29/2014   Spontaneous bruising 08/27/2014   Numbness and tingling in right hand 07/28/2014   Essential tremor 07/28/2014   Dizziness 05/28/2014   Shaky 05/28/2014   Memory loss 05/28/2014   Depression 05/06/2014   Lipoma of left upper thigh 3x5 cm 09/28/2011   Cerebral vascular accident ([place]) 08/09/2011   Syncope 08/09/2011   History of breast cancer [id number], s/p BCT 2008, triple negative 01/26/2011   Chronic L breast pain with chronic recurrent seroma, s/p excisional biopsy 01/12/2010 01/26/2011   Fainted    ICD (implantable cardioverter-defibrillator), biventricular, in situ 08/02/2010   Chronic  systolic heart failure ([place]) 08/02/2010   Hypertension 08/02/2010    Past Surgical History:  Procedure Laterality Date   BACK SURGERY     lumbar fusion    BREAST LUMPECTOMY Left 2008   BREAST SURGERY  2000   LUMP REMOVAL. STAGE 1 CANCER   CARDIAC CATHETERIZATION     CATARACT EXTRACTION     COLONOSCOPY     EYE SURGERY     IMPLANTABLE CARDIOVERTER DEFIBRILLATOR GENERATOR CHANGE N/A 12/18/2012   Procedure: IMPLANTABLE CARDIOVERTER DEFIBRILLATOR GENERATOR CHANGE;  Surgeon: [name], MD;  Location: [facility] CATH LAB;  Service: Cardiovascular;   Laterality: N/A;   JOINT REPLACEMENT  06/14/01   right   LUMBAR FUSION  2006   MASS EXCISION  11/08/2011   Procedure: EXCISION MASS;  Surgeon: [name], MD;  Location: [facility] ORS;  Service: General;  Laterality: Left;  Excision Left Thigh Mass   MASTECTOMY W/ SENTINEL NODE BIOPSY Left 06/04/2019   Procedure: LEFT MASTECTOMY WITH SENTINEL LYMPH NODE BIOPSY;  Surgeon: [name], MD;  Location: [place];  Service: General;  Laterality: Left;   MASTECTOMY, PARTIAL  2008   GOT PACEMAKER AND DEFIB AT THAT TIME   PACEMAKER INSERTION  04/23/06   TOTAL KNEE ARTHROPLASTY  05/17/01   RIGHT KNEE   TOTAL KNEE ARTHROPLASTY Left 11/29/2014   Procedure: TOTAL LEFT KNEE ARTHROPLASTY;  Surgeon: Paralee Cancel, MD;  Location: [facility] ORS;  Service: Orthopedics;  Laterality: Left;    OB History   No obstetric history on file.      Home Medications    Prior to Admission medications   Medication Sig Start Date End Date Taking? Authorizing Provider  acetaminophen (TYLENOL) 500 MG tablet Take 500 mg by mouth as needed.   Yes [provider]  acetaminophen (TYLENOL) 650 MG CR tablet Take 650 mg by mouth every 8 (eight) hours as needed for pain.   Yes [provider]  amLODipine (NORVASC) 5 MG tablet Take 0.5 tablets (2.5 mg total) by mouth daily. 11/14/20  Yes Ngetich, Dinah C, NP  ascorbic acid (VITAMIN C) 500 MG tablet Take 500 mg by mouth daily.   Yes [provider]  buPROPion (WELLBUTRIN XL) 150 MG 24 hr tablet Take 1 tablet (150 mg total) by mouth daily. 08/12/20  Yes [name], NP  carvedilol (COREG) 3.125 MG tablet TAKE 1 TABLET BY MOUTH  TWICE DAILY 05/25/20  Yes [name], MD  Cholecalciferol (VITAMIN D3) 50 MCG (2000 UT) TABS Take 2,000 Units by mouth daily.    Yes [provider]  diclofenac Sodium (VOLTAREN) 1 % GEL Apply 4 g topically 4 (four) times daily. 09/18/20  Yes Joy, Shawn C, PA-C  fluticasone (FLONASE) 50 MCG/ACT nasal spray Place 2 sprays into both  nostrils daily. 02/04/20  Yes Reed, Tiffany L, DO  furosemide (LASIX) 20 MG tablet TAKE 1 TABLET BY MOUTH  DAILY 05/25/20  Yes [name], NP  levothyroxine (SYNTHROID, LEVOTHROID) 100 MCG tablet Take 1 tablet (100 mcg total) by mouth daily before breakfast. 05/20/15  Yes [name], DO  Lidocaine 4 % PTCH Apply 1 patch topically every 12 (twelve) hours. Apply to back   Yes [provider]  Magnesium 250 MG TABS Take 250 mg by mouth daily.   Yes [provider]  meclizine (ANTIVERT) 12.5 MG tablet Take 1 tablet (12.5 mg total) by mouth 3 (three) times daily as needed for dizziness. 07/23/19  Yes Suzzanne Cloud, NP  methocarbamol (ROBAXIN) 500  MG tablet Take 1 tablet (500 mg total) by mouth every 8 (eight) hours as needed. 11/14/20  Yes Ngetich, Dinah C, NP  mirabegron ER (MYRBETRIQ) 50 MG TB24 tablet Take 1 tablet (50 mg total) by mouth daily. 08/12/20  Yes [name], NP  NONFORMULARY OR COMPOUNDED ITEM Apply 120 Tubes topically daily. Diclofenac/Cyclobenzaprine/lamotrigine/lidocaine/prilocaine (10480) 2%/2%/6%/5%/1.25% cream QTY: 120 GM SIG: (NEURO) APPLY 1-2 PUMPS (1-2 GMS) TO AFFECTED AREA (S) OR FOCAL POINTS 3 TO 4 TIMES DAILY. RUB IN FOR 2 MINUTES TO ACHIEVE MAX PENETRATION. [facility] WELL. 10/07/19  Yes [name], NP  Omega-3 Fatty Acids (FISH OIL PO) Take 1 capsule by mouth daily.   Yes [provider]  omeprazole (PRILOSEC) 20 MG capsule Take 2 capsules (40 mg total) by mouth daily. 12/15/20  Yes [name], NP  Oxcarbazepine (TRILEPTAL) 300 MG tablet Take 300 mg by mouth daily.  01/12/13  Yes [provider]  polyethylene glycol powder (GLYCOLAX/MIRALAX) 17 GM/SCOOP powder Take 1 Container by mouth as needed.   Yes [provider]  potassium chloride (KLOR-CON) 10 MEQ tablet TAKE 1 TABLET BY MOUTH  DAILY 05/25/20  Yes [name], NP  senna-docusate (SENOKOT S) 8.6-50 MG tablet Take 2 tablets by mouth daily.  01/28/20  Yes Reed, Tiffany L, DO  sertraline (ZOLOFT) 100 MG tablet Take 200 mg by mouth daily.  02/27/18  Yes [provider]  spironolactone (ALDACTONE) 25 MG tablet TAKE 1 TABLET BY MOUTH IN  THE MORNING 05/25/20  Yes [name], NP  Vitamin E 400 units TABS Take 400 Units by mouth daily.    Yes [provider]    Family History Family History  Problem Relation Age of Onset   Hypertension Mother    Arthritis Mother    Hypertension Father    Hypertension Brother    Hypertension Brother     Social History Social History   Tobacco Use   Smoking status: Never   Smokeless tobacco: Never  Vaping Use   Vaping Use: Never used  Substance Use Topics   Alcohol use: No   Drug use: No     Allergies   Iodine, Shellfish allergy, Memantine, Aspirin, and Codeine   Review of Systems Review of Systems  Constitutional: Negative.   Respiratory: Negative.    Cardiovascular: Negative.   Musculoskeletal:  Positive for back pain. Negative for arthralgias, gait problem, joint swelling, myalgias, neck pain and neck stiffness.  Skin: Negative.   Neurological: Negative.     Physical Exam Triage Vital Signs ED Triage Vitals  Enc Vitals Group     BP 01/09/21 1113 (!) 158/76     Pulse Rate 01/09/21 1113 67     Resp --      Temp 01/09/21 1113 (!) 97.4 F (36.3 C)     Temp Source 01/09/21 1113 Oral     SpO2 01/09/21 1113 99 %     Weight --      Height --      Head Circumference --      Peak Flow --      Pain Score 01/09/21 1111 8     Pain Loc --      Pain Edu? --      Excl. in [place]? --    No data found.  Updated Vital Signs BP (!) 158/76 (BP Location: Right Arm)   Pulse 67   Temp (!) 97.4 F (36.3 C) (Oral)   LMP  (LMP Unknown)  SpO2 99%   Visual Acuity Right Eye Distance:   Left Eye Distance:   Bilateral Distance:    Right Eye Near:   Left Eye Near:    Bilateral Near:     Physical Exam Constitutional:      Appearance: Normal appearance.  She is normal weight.  HENT:     Head: Normocephalic.  Eyes:     Extraocular Movements: Extraocular movements intact.  Pulmonary:     Effort: Pulmonary effort is normal.  Musculoskeletal:     Comments: Tenderness over the medial aspect of the lumbar spine, spasms present, no bony tenderness, range of movement intact but elicits pain  Skin:    General: Skin is warm and dry.  Neurological:     Mental Status: She is alert and oriented to person, place, and time. Mental status is at baseline.  Psychiatric:        Mood and Affect: Mood normal.        Behavior: Behavior normal.     UC Treatments / Results  Labs (all labs ordered are listed, but only abnormal results are displayed) Labs Reviewed - No data to display  EKG   Radiology No results found.  Procedures Procedures (including critical care time)  Medications Ordered in UC Medications  ketorolac (TORADOL) 30 MG/ML injection 30 mg (has no administration in time range)  methylPREDNISolone sodium succinate (SOLU-MEDROL) 125 mg/2 mL injection 60 mg (has no administration in time range)  ondansetron (ZOFRAN-ODT) disintegrating tablet 4 mg (has no administration in time range)    Initial Impression / Assessment and Plan / UC Course  I have reviewed the triage vital signs and the nursing notes.  Pertinent labs & imaging results that were available during my care of the patient were reviewed by me and considered in my medical decision making (see chart for details).  Lumbar back pain with radiculopathy affecting lower extremity  1.  Toradol 30 mg IM now, reviewed allergy, unable to tolerate oral NSAIDs 2.  Methylprednisolone 60 mg IM, unable to tolerate oral steroid course 3.  Advised patient to continue use of Tylenol, muscle relaxant and topical medications at home and to follow-up with orthopedic specialist or primary care doctor for persistent pain occurring pain for further evaluation Final Clinical Impressions(s) / UC  Diagnoses   Final diagnoses:  Lumbar back pain with radiculopathy affecting lower extremity     Discharge Instructions      Your pain is most likely caused by irritation to the muscles or ligaments.  You have been given 2 injections today to help with the reduce irritation and prevent further inflammation which ideally will reduce your pain.  Once home please continue use of Tylenol, Robaxin, lidocaine patches and Voltaren gel to further assist with your pain.  You may use heating pad in 15 minute intervals as needed for additional comfort.  Begin stretching affected area daily for 10 minutes as tolerated to further loosen muscles   When lying down place pillow underneath and between knees for support  Can try sleeping without pillow on firm mattress   Practice good posture: head back, shoulders back, chest forward, pelvis back and weight distributed evenly on both legs  If pain persist after recommended treatment or reoccurs if may be beneficial to follow up with orthopedic specialist for evaluation, this doctor specializes in the bones and can manage your symptoms long-term with options such as but not limited to imaging, medications or physical therapy      ED Prescriptions  None    PDMP not reviewed this encounter.   Hans Eden, NP 01/09/21 1154

## 2021-01-09 NOTE — Discharge Instructions (Addendum)
Your pain is most likely caused by irritation to the muscles or ligaments.  You have been given 2 injections today to help with the reduce irritation and prevent further inflammation which ideally will reduce your pain.  Once home please continue use of Tylenol, Robaxin, lidocaine patches and Voltaren gel to further assist with your pain.  You may use heating pad in 15 minute intervals as needed for additional comfort.  Begin stretching affected area daily for 10 minutes as tolerated to further loosen muscles   When lying down place pillow underneath and between knees for support  Can try sleeping without pillow on firm mattress   Practice good posture: head back, shoulders back, chest forward, pelvis back and weight distributed evenly on both legs  If pain persist after recommended treatment or reoccurs if may be beneficial to follow up with orthopedic specialist for evaluation, this doctor specializes in the bones and can manage your symptoms long-term with options such as but not limited to imaging, medications or physical therapy

## 2021-01-09 NOTE — ED Triage Notes (Signed)
Pt has a hx of Back Pain. The past 3 weeks her back pain is getting worse and is radiating down bilateral legs.

## 2021-01-11 ENCOUNTER — Telehealth: Payer: Self-pay

## 2021-01-11 NOTE — Telephone Encounter (Signed)
Verbal orders given Pt one week x2 day starting 01/30/21 to make up missed appointments. Therapist stated that pt is going to talk to her PCP about a referral to pain management  for lower back pain

## 2021-01-12 ENCOUNTER — Ambulatory Visit: Payer: Medicare Other | Attending: General Surgery | Admitting: Physical Therapy

## 2021-01-12 ENCOUNTER — Other Ambulatory Visit: Payer: Self-pay

## 2021-01-12 DIAGNOSIS — M25512 Pain in left shoulder: Secondary | ICD-10-CM | POA: Insufficient documentation

## 2021-01-12 DIAGNOSIS — Z483 Aftercare following surgery for neoplasm: Secondary | ICD-10-CM | POA: Insufficient documentation

## 2021-01-12 DIAGNOSIS — M25612 Stiffness of left shoulder, not elsewhere classified: Secondary | ICD-10-CM | POA: Insufficient documentation

## 2021-01-12 DIAGNOSIS — I972 Postmastectomy lymphedema syndrome: Secondary | ICD-10-CM | POA: Diagnosis not present

## 2021-01-12 NOTE — Therapy (Signed)
[facility] @ [place], [place], [address] Phone:     Fax:     Physical Therapy Evaluation  Patient Details  Name: [name] MRN: [id number] Date of Birth: 01/14/1934 Referring Provider (PT): Dr. Barry Dienes (Dr Barry Dienes is surgeon)   Encounter Date: 01/12/2021   PT End of Session - 01/12/21 1359     Visit Number 1    Number of Visits 9    Date for PT Re-Evaluation 02/13/21    PT Start Time 1200    PT Stop Time 1250    PT Time Calculation (min) 50 min    Activity Tolerance Patient tolerated treatment well    Behavior During Therapy [facility] for tasks assessed/performed             Past Medical History:  Diagnosis Date   Arthritis    Cancer ([place])    left breast cancer    Cataracts, bilateral    removed by surgery   CHF (congestive heart failure) ([place])    PACEMAKER & DEFIB   Complication of anesthesia    hypotensive after back surgery in 2006   Depression    Dyslipidemia    Fainted 04/21/06   AT [facility]   GERD (gastroesophageal reflux disease)    Headache(784.0)    Hearing loss    bilateral hearing aids   HLD (hyperlipidemia)    diet controlled    Hypertension    Hypothyroidism    ICD (implantable cardiac defibrillator) in place    pt has pacer/icd   ICD (implantable cardiac defibrillator), biventricular, in situ    LBBB (left bundle branch block)    Memory loss    Nonischemic cardiomyopathy ([place])    Normal coronary arteries    s/p cardiac cath 2007   Pacemaker    ICD [employer]   Syncope    Systolic CHF [facility])    Vertigo    Wears glasses     Past Surgical History:  Procedure Laterality Date   BACK SURGERY     lumbar fusion    BREAST LUMPECTOMY Left 2008   BREAST SURGERY  2000   LUMP REMOVAL. STAGE 1 CANCER   CARDIAC CATHETERIZATION     CATARACT EXTRACTION     COLONOSCOPY     EYE SURGERY     IMPLANTABLE CARDIOVERTER DEFIBRILLATOR GENERATOR CHANGE N/A 12/18/2012    Procedure: IMPLANTABLE CARDIOVERTER DEFIBRILLATOR GENERATOR CHANGE;  Surgeon: [name], MD;  Location: [facility] CATH LAB;  Service: Cardiovascular;  Laterality: N/A;   JOINT REPLACEMENT  06/14/01   right   LUMBAR FUSION  2006   MASS EXCISION  11/08/2011   Procedure: EXCISION MASS;  Surgeon: [name], MD;  Location: [facility] ORS;  Service: General;  Laterality: Left;  Excision Left Thigh Mass   MASTECTOMY W/ SENTINEL NODE BIOPSY Left 06/04/2019   Procedure: LEFT MASTECTOMY WITH SENTINEL LYMPH NODE BIOPSY;  Surgeon: [name], MD;  Location: [facility];  Service: General;  Laterality: Left;   MASTECTOMY, PARTIAL  2008   GOT PACEMAKER AND DEFIB AT THAT TIME   PACEMAKER INSERTION  04/23/06   TOTAL KNEE ARTHROPLASTY  05/17/01   RIGHT KNEE   TOTAL KNEE ARTHROPLASTY Left 11/29/2014   Procedure: TOTAL LEFT KNEE ARTHROPLASTY;  Surgeon: Paralee Cancel, MD;  Location: [facility];  Service: Orthopedics;  Laterality: Left;    There were no vitals filed for this visit.    Subjective Assessment - 01/12/21 1200  Subjective Pt returns to PT  after a visit with Dr. Barry Dienes to work the swelling and cordin her left axilla and and chest    Patient is accompained by: Family member   Onset   Pertinent History left breast cancer with left mastectomy on 06/04/2019 with 5 lymph nodes . past history includes lumpectomy in 2008 with radiation and no lymph nodes removed she has chronic left breast pain and seroma with excisional biopsy in 2011.  Hx includes CHR with implanted defibrillator in left chest, bilateral TKR, osteoporosis. Pt is currently having back pain    Patient Stated Goals to get rid to the pain and tightness in her left axilla    Currently in Pain? Yes    Pain Score 5     Pain Location Axilla    Pain Orientation Left    Pain Descriptors / Indicators Tender;Tightness    Pain Type Chronic pain    Pain Onset More than a month ago    Pain Frequency Intermittent    Aggravating Factors  lifting anything heavy makes  it worse    Pain Relieving Factors can't really say what makes it better                [facility] PT Assessment - 01/12/21 0001       Assessment   Medical Diagnosis left breast cancer     Referring Provider (PT) Dr. Barry Dienes   Dr Barry Dienes is surgeon   Onset Date/Surgical Date 06/04/19      [place] residence    Living Arrangements Children    Available Help at Discharge Family      Prior Function   Level of [place]   Overall Cognitive Status Within Functional Limits for tasks assessed      Observation/Other Assessments   Observations pt with well healed incision on left chest, defibrillator very prominent. visible fullness in left upper arm with no fullness visible below elbow.  Pt has a velcro compression arm sleeve but admits she rarely pulls it up high enough because her skin is painfull and it pinches her pt with pain patch across low back    Skin Integrity well healed      Coordination   Gross Motor Movements are Fluid and Coordinated No   limited by left shoulder pain     Posture/Postural Control   Posture/Postural Control Postural limitations    Postural Limitations Rounded Shoulders;Forward head      ROM / Strength   AROM / PROM / Strength AROM;Strength      AROM   Left Shoulder Flexion 135 Degrees    Left Shoulder ABduction 125 Degrees      Palpation   Palpation comment extremely tender to touch at left posterior axilla ( Holle Sprick major/lattissimus area) Pt literally jumps to touch               LYMPHEDEMA/ONCOLOGY QUESTIONNAIRE - 01/12/21 0001       Right Upper Extremity Lymphedema   10 cm Proximal to Olecranon Process 24 cm    Olecranon Process 22 cm    15 cm Proximal to Ulnar Styloid Process 21 cm    10 cm Proximal to Ulnar Styloid Process 18 cm    Just Proximal to Ulnar Styloid Process 13.7 cm    Across Hand at [employer] 16.5 cm    At [place] of 2nd Digit 5.5 cm      Left  Upper Extremity Lymphedema   10 cm Proximal to Olecranon Process 27 cm    Olecranon Process 22 cm    15 cm Proximal to Ulnar Styloid Process 21.5 cm    10 cm Proximal to Ulnar Styloid Process 19 cm    Just Proximal to Ulnar Styloid Process 14.5 cm    Across Hand at [employer] 16.8 cm    At [place] of 2nd Digit 5.5 cm                     Objective measurements completed on examination: See above findings.       [facility] Adult PT Treatment/Exercise - 01/12/21 0001       Manual Therapy   Manual Therapy Edema management;Soft tissue mobilization    Manual therapy comments tried farrow wrap on arm. It is tight on upper arm, but still able to be applied. She had some tenderness at top of garment so added piece of thin white foam to pad area and she was better with tthat    Soft tissue mobilization in supine and sidelying, with coca butter, stroking and prolonged pressure to tender areas at posterior shoulder and axilla with pt reporting relief at end of session    Manual Lymphatic Drainage (MLD) breifly, stationary circles to left upper arm                       PT Short Term Goals - 09/22/19 1734       PT SHORT TERM GOAL #1   Title Pt  will report pain in left shoulder is decreased to 6/10    Status Achieved      PT SHORT TERM GOAL #2   Title Pt wil be independent in a basic HEP for shoulder ROM    Status Achieved               PT Long Term Goals - 01/12/21 1403       PT LONG TERM GOAL #1   Title Pt will have reduction in left upper arm at 10 cm proximal  to olecranon by 1 cm    Baseline 27 m on 01/12/2021    Time 4    Period Weeks    Status New      PT LONG TERM GOAL #2   Title Pt will report the pain in her left axilla is a 3/5 at rest    Baseline 01/12/2021 5/10    Time 4    Period Weeks    Status New      PT LONG TERM GOAL #3   Title Pt will be independent in a basic shoulder ROM exercise program    Time 4    Period Weeks    Status  New                    Plan - 01/12/21 1359     Clinical Impression Statement Pt comes back to PT with increased pain in left axilla/posterior shoulder and increased swelling in left upper arm. She has a farrow velcro wrap but admits to not pulling it all the way up her arm as she has increased skin tenderness in that area. She received some relief from manual work today and hopes she will get continues relief with manual work.  Talked to her about dry needling and she may consider that if she does not get relief in a few weeks.    Personal  Factors and Comorbidities Age    Comorbidities previous triple negataive left breast cancer, seroma after lumpectomy, CHF with defibrillator, Bilateral TKR osteoporosis    Examination-Activity Limitations Reach Overhead;Bathing    Stability/Clinical Decision Making Stable/Uncomplicated    Clinical Decision Making Low    Rehab Potential Good    PT Frequency 2x / week    PT Duration 4 weeks    PT Treatment/Interventions ADLs/Self Care Home Management;DME Instruction;Therapeutic activities;Patient/family education;Orthotic Fit/Training;Manual techniques;Manual lymph drainage;Compression bandaging;Scar mobilization;Passive range of motion;Therapeutic exercise    PT Next Visit Plan Manual work to tightness at posterior shoulder /axilla with MLD to left upper arm Issue tg soft to left arm for comfortable light compression at home, add AROM as tolerated    Consulted and Agree with Plan of Care Patient             Patient will benefit from skilled therapeutic intervention in order to improve the following deficits and impairments:  Decreased activity tolerance, Decreased knowledge of precautions, Decreased knowledge of use of DME, Decreased range of motion, Decreased strength, Increased fascial restricitons, Increased edema, Impaired perceived functional ability, Increased muscle spasms, Impaired UE functional use, Postural dysfunction, Pain  Visit  Diagnosis: Postmastectomy lymphedema - Plan: PT plan of care cert/re-cert  Acute pain of left shoulder - Plan: PT plan of care cert/re-cert  Aftercare following surgery for neoplasm - Plan: PT plan of care cert/re-cert  Stiffness of left shoulder joint - Plan: PT plan of care cert/re-cert     Problem List Patient Active Problem List   Diagnosis Date Noted   Dementia without behavioral disturbance ([place]) 02/04/2020   Urinary retention 02/04/2020   Seasonal allergies 02/04/2020   Recurrent major depressive disorder, in partial remission ([place]) 07/17/2019   Status post left mastectomy 07/01/2019   Breast cancer of lower-outer quadrant of left female breast ([place]) 06/04/2019   Candida infection, oral 01/15/2019   Senile purpura ([place]) 04/14/2018   Lumbar post-laminectomy syndrome 12/03/2017   Lumbar spondylosis 12/03/2017   History of back surgery 09/01/2017   Constipation 09/01/2017   Hypothyroidism due to acquired atrophy of thyroid 09/01/2017   Mixed hyperlipidemia 09/01/2017   Age-related osteoporosis without current pathological fracture 09/01/2017   High risk medication use 09/01/2017   Arthritis of hand 05/17/2017   Atherosclerosis of native arteries of extremity with intermittent claudication ([place]) 05/15/2017   Status post total bilateral knee replacement 12/20/2016   Gait abnormality 07/16/2016   Chronic low back pain 07/16/2016   Mild cognitive impairment 07/16/2016   Wrist pain 05/10/2015   S/P left TKA 11/29/2014   S/P knee replacement 11/29/2014   Spontaneous bruising 08/27/2014   Numbness and tingling in right hand 07/28/2014   Essential tremor 07/28/2014   Dizziness 05/28/2014   Shaky 05/28/2014   Memory loss 05/28/2014   Depression 05/06/2014   Lipoma of left upper thigh 3x5 cm 09/28/2011   Cerebral vascular accident ([place]) 08/09/2011   Syncope 08/09/2011   History of breast cancer [id number], s/p BCT 2008, triple negative 01/26/2011   Chronic L breast pain with  chronic recurrent seroma, s/p excisional biopsy 01/12/2010 01/26/2011   Fainted    ICD (implantable cardioverter-defibrillator), biventricular, in situ 48/54/6270   Chronic systolic heart failure ([place]) 08/02/2010   Hypertension 08/02/2010   Donato Heinz. Owens Shark PT  Norwood Levo, PT 01/12/2021, 2:08 PM  [facility] @ [place], [place], [address] Phone:     Fax:     Name:  [name] MRN: [id number] Date of Birth: [birth year]

## 2021-01-17 ENCOUNTER — Encounter: Payer: Self-pay | Admitting: Family

## 2021-01-17 ENCOUNTER — Other Ambulatory Visit: Payer: Self-pay

## 2021-01-17 ENCOUNTER — Ambulatory Visit (INDEPENDENT_AMBULATORY_CARE_PROVIDER_SITE_OTHER): Payer: Medicare Other | Admitting: Family

## 2021-01-17 VITALS — BP 118/66 | HR 67 | Temp 97.5°F | Ht 59.0 in | Wt 116.0 lb

## 2021-01-17 DIAGNOSIS — G8929 Other chronic pain: Secondary | ICD-10-CM

## 2021-01-17 DIAGNOSIS — M5442 Lumbago with sciatica, left side: Secondary | ICD-10-CM | POA: Diagnosis not present

## 2021-01-17 DIAGNOSIS — Z23 Encounter for immunization: Secondary | ICD-10-CM | POA: Diagnosis not present

## 2021-01-17 DIAGNOSIS — M5441 Lumbago with sciatica, right side: Secondary | ICD-10-CM

## 2021-01-17 DIAGNOSIS — L739 Follicular disorder, unspecified: Secondary | ICD-10-CM

## 2021-01-17 DIAGNOSIS — Z66 Do not resuscitate: Secondary | ICD-10-CM | POA: Diagnosis not present

## 2021-01-17 DIAGNOSIS — R29898 Other symptoms and signs involving the musculoskeletal system: Secondary | ICD-10-CM

## 2021-01-17 MED ORDER — PREDNISONE 10 MG PO TABS: ORAL_TABLET | ORAL | 0 refills | Status: AC

## 2021-01-17 NOTE — Progress Notes (Signed)
Provider: Mattie Novosel FNP-C  [name], NP  Patient Care Team: [name], NP as PCP - General (Geriatric Medicine) [name], MD as Consulting Physician (General Surgery) Paralee Cancel, MD as Consulting Physician (Orthopedic Surgery) Marica Otter, [place] (Optometry) Marcial Pacas, MD as Consulting Physician (Neurology) [name], MD as Consulting Physician (Cardiology) Ricard Dillon, MD (Psychiatry)  Extended Emergency Contact Information Primary Emergency Contact: Juel Burrow Address: [address]          [place], [place] [address] Johnnette Litter of [place] Phone: [phone] Relation: Daughter Secondary Emergency Contact: [facility] Address: [address]          [place], [place] [address] Johnnette Litter of [place] Mobile Phone: [phone] Relation: Friend  Code Status:  DNR Goals of care: Advanced Directive information Advanced Directives 01/17/2021  Does Patient Have a Medical Advance Directive? Yes  Type of Advance Directive Out of facility DNR (pink MOST or yellow form);Living will  Does patient want to make changes to medical advance directive? No - Patient declined  Copy of [place] in Chart? -  Would patient like information on creating a medical advance directive? -  Pre-existing out of facility DNR order (yellow form or pink MOST form) Yellow form placed in chart (order not valid for inpatient use)     Chief Complaint  Patient presents with   Acute Visit    Sciatic back pain getting worse. Ongoing dizziness. Chaffing x couple of months and bulge in vaginal area x 24 hours. Flu vaccine today.     HPI:  Pt is a 87 y.o. female seen today for an acute visit for evaluation of bilateral sciatic pain.she is here with her daughter who provides additional HPI information.states her chronic bilateral sciatic pain has worsen especially in the morning when getting up or go to the Bathroom at night.Has spasm on  both legs but right worse than the left.Pain described as radiating from both buttocks down to the foot.daughter states concerned since pain used to radiate to behind the thigh area but now affected entire leg.She denies any numbness or tingling on the legs but states right leg has been weak.denies any loss of bowel or bladder control. Has taken tylenol without any relief.Has used Gabapentin prescribed by Dr.Tiffany reed in the past but did not like it since it made her feel dizzy and drowsy. She is schedule to see Orthopedic on 01/23/2021.  Also complains of chaffing on peri-area and inner thighs for couple of months.states burns when urine touches it when using the bathroom.Has bulging area on her left vulva area that she request examination today noted x 24 hrs.Ara not painful.No drainage.     Past Medical History:  Diagnosis Date   Arthritis    Cancer ([place])    left breast cancer    Cataracts, bilateral    removed by surgery   CHF (congestive heart failure) ([place])    PACEMAKER & DEFIB   Complication of anesthesia    hypotensive after back surgery in 2006   Depression    Dyslipidemia    Fainted 04/21/06   AT [facility]   GERD (gastroesophageal reflux disease)    Headache(784.0)    Hearing loss    bilateral hearing aids   HLD (hyperlipidemia)    diet controlled    Hypertension    Hypothyroidism    ICD (implantable cardiac defibrillator) in place    pt has pacer/icd   ICD (implantable cardiac defibrillator), biventricular, in situ  LBBB (left bundle branch block)    Memory loss    Nonischemic cardiomyopathy ([place])    Normal coronary arteries    s/p cardiac cath 2007   Pacemaker    ICD [employer]   Syncope    Systolic CHF [facility])    Vertigo    Wears glasses    Past Surgical History:  Procedure Laterality Date   BACK SURGERY     lumbar fusion    BREAST LUMPECTOMY Left 2008   BREAST SURGERY  2000   LUMP REMOVAL. STAGE 1 CANCER   CARDIAC CATHETERIZATION     CATARACT  EXTRACTION     COLONOSCOPY     EYE SURGERY     IMPLANTABLE CARDIOVERTER DEFIBRILLATOR GENERATOR CHANGE N/A 12/18/2012   Procedure: IMPLANTABLE CARDIOVERTER DEFIBRILLATOR GENERATOR CHANGE;  Surgeon: [name], MD;  Location: [facility] CATH LAB;  Service: Cardiovascular;  Laterality: N/A;   JOINT REPLACEMENT  06/14/01   right   LUMBAR FUSION  2006   MASS EXCISION  11/08/2011   Procedure: EXCISION MASS;  Surgeon: [name], MD;  Location: [facility] ORS;  Service: General;  Laterality: Left;  Excision Left Thigh Mass   MASTECTOMY W/ SENTINEL NODE BIOPSY Left 06/04/2019   Procedure: LEFT MASTECTOMY WITH SENTINEL LYMPH NODE BIOPSY;  Surgeon: [name], MD;  Location: [place];  Service: General;  Laterality: Left;   MASTECTOMY, PARTIAL  2008   GOT PACEMAKER AND DEFIB AT THAT TIME   PACEMAKER INSERTION  04/23/06   TOTAL KNEE ARTHROPLASTY  05/17/01   RIGHT KNEE   TOTAL KNEE ARTHROPLASTY Left 11/29/2014   Procedure: TOTAL LEFT KNEE ARTHROPLASTY;  Surgeon: Paralee Cancel, MD;  Location: [facility] ORS;  Service: Orthopedics;  Laterality: Left;    Allergies  Allergen Reactions   Iodine Shortness Of Breath    Iodine contrast, CHF , SOB   Shellfish Allergy Shortness Of Breath   Memantine     Malaise, fogginess/ couldn't think   Aspirin Nausea And Vomiting   Codeine Nausea And Vomiting    Outpatient Encounter Medications as of 01/17/2021  Medication Sig   acetaminophen (TYLENOL) 500 MG tablet Take 500 mg by mouth in the morning and at bedtime. 500 mg in the morning and afternoon   acetaminophen (TYLENOL) 650 MG CR tablet Take 650 mg by mouth at bedtime.   amLODipine (NORVASC) 5 MG tablet Take 0.5 tablets (2.5 mg total) by mouth daily.   ascorbic acid (VITAMIN C) 500 MG tablet Take 500 mg by mouth daily.   buPROPion (WELLBUTRIN XL) 150 MG 24 hr tablet Take 1 tablet (150 mg total) by mouth daily.   carvedilol (COREG) 3.125 MG tablet TAKE 1 TABLET BY MOUTH  TWICE DAILY   Cholecalciferol (VITAMIN D3) 50 MCG (2000 UT)  TABS Take 2,000 Units by mouth daily.    diclofenac Sodium (VOLTAREN) 1 % GEL Apply 4 g topically 4 (four) times daily.   fluticasone (FLONASE) 50 MCG/ACT nasal spray Place 2 sprays into both nostrils daily.   furosemide (LASIX) 20 MG tablet TAKE 1 TABLET BY MOUTH  DAILY   levothyroxine (SYNTHROID, LEVOTHROID) 100 MCG tablet Take 1 tablet (100 mcg total) by mouth daily before breakfast.   Lidocaine 4 % PTCH Apply 1 patch topically every 12 (twelve) hours. Apply to back   Magnesium 250 MG TABS Take 250 mg by mouth daily.   meclizine (ANTIVERT) 12.5 MG tablet Take 1 tablet (12.5 mg total) by mouth 3 (three) times daily as needed for dizziness.   methocarbamol (  ROBAXIN) 500 MG tablet Take 1 tablet (500 mg total) by mouth every 8 (eight) hours as needed.   mirabegron ER (MYRBETRIQ) 50 MG TB24 tablet Take 1 tablet (50 mg total) by mouth daily.   NONFORMULARY OR COMPOUNDED ITEM Apply 120 Tubes topically daily. Diclofenac/Cyclobenzaprine/lamotrigine/lidocaine/prilocaine (10480) 2%/2%/6%/5%/1.25% cream QTY: 120 GM SIG: (NEURO) APPLY 1-2 PUMPS (1-2 GMS) TO AFFECTED AREA (S) OR FOCAL POINTS 3 TO 4 TIMES DAILY. RUB IN FOR 2 MINUTES TO ACHIEVE MAX PENETRATION. [place] HANDS WELL.   Omega-3 Fatty Acids (FISH OIL PO) Take 1 capsule by mouth daily.   omeprazole (PRILOSEC) 20 MG capsule Take 2 capsules (40 mg total) by mouth daily.   Oxcarbazepine (TRILEPTAL) 300 MG tablet Take 300 mg by mouth daily.    polyethylene glycol powder (GLYCOLAX/MIRALAX) 17 GM/SCOOP powder Take 1 Container by mouth as needed.   potassium chloride (KLOR-CON) 10 MEQ tablet TAKE 1 TABLET BY MOUTH  DAILY   senna-docusate (SENOKOT S) 8.6-50 MG tablet Take 2 tablets by mouth daily.   sertraline (ZOLOFT) 100 MG tablet Take 200 mg by mouth daily.    spironolactone (ALDACTONE) 25 MG tablet TAKE 1 TABLET BY MOUTH IN  THE MORNING   Vitamin E 400 units TABS Take 400 Units by mouth daily.    No facility-administered encounter medications on file  as of 01/17/2021.    Review of Systems  Constitutional:  Negative for appetite change, chills, fatigue, fever and unexpected weight change.  HENT:  Negative for trouble swallowing.   Respiratory:  Negative for cough, chest tightness, shortness of breath and wheezing.   Cardiovascular:  Negative for chest pain, palpitations and leg swelling.  Gastrointestinal:  Negative for abdominal distention, abdominal pain, blood in stool, constipation, diarrhea, nausea and vomiting.  Genitourinary:  Negative for difficulty urinating, dysuria, flank pain, frequency and urgency.  Musculoskeletal:  Positive for gait problem. Negative for arthralgias, back pain, joint swelling and myalgias.  Skin:  Negative for color change, pallor, rash and wound.       Small swollen area on left vulva area  Irritated skin   Neurological:  Negative for dizziness, weakness, light-headedness, numbness and headaches.  Hematological:  Does not bruise/bleed easily.  Psychiatric/Behavioral:  Negative for agitation, behavioral problems, confusion, hallucinations and sleep disturbance. The patient is not nervous/anxious.    Immunization History  Administered Date(s) Administered   Fluad Quad(high Dose 65+) 01/28/2020   Influenza, High Dose Seasonal PF 01/16/2018, 12/08/2018   Influenza-Unspecified 12/08/2013, 01/10/2015, 12/09/2015, 01/12/2017   PFIZER(Purple Top)SARS-COV-2 Vaccination 05/23/2019, 06/15/2019, 12/23/2019, 08/30/2020   Pneumococcal Conjugate-13 04/09/2012   Pneumococcal Polysaccharide-23 02/28/2016   Tdap 01/07/2013   Zoster, Live 08/05/2014   Pertinent  Health Maintenance Due  Topic Date Due   INFLUENZA VACCINE  11/07/2020   MAMMOGRAM  03/18/2021   DEXA SCAN  Completed   Fall Risk  11/14/2020 09/28/2020 08/12/2020 07/15/2020 02/15/2020  Falls in the past year? 0 0 0 1 0  Number falls in past yr: 0 0 0 1 0  Injury with Fall? 0 0 0 0 0  Comment - - - - -  Risk for fall due to : No Fall Risks - - History of  fall(s) -  Follow up Falls evaluation completed - - - -   Functional Status Survey:    Vitals:   01/17/21 0952  BP: 118/66  Pulse: 67  Temp: (!) 97.5 F (36.4 C)  TempSrc: Temporal  SpO2: 96%  Weight: 116 lb (52.6 kg)  Height: 4\' 11"  (1.499 m)  Body mass index is 23.43 kg/m. Physical Exam Vitals reviewed. Exam conducted with a chaperone present.  Constitutional:      General: She is not in acute distress.    Appearance: Normal appearance. She is normal weight. She is not ill-appearing or diaphoretic.  HENT:     Head: Normocephalic.     Nose: Nose normal. No congestion or rhinorrhea.     Mouth/Throat:     Mouth: Mucous membranes are moist.     Pharynx: Oropharynx is clear. No oropharyngeal exudate or posterior oropharyngeal erythema.  Eyes:     General: No scleral icterus.       Right eye: No discharge.        Left eye: No discharge.     Conjunctiva/sclera: Conjunctivae normal.     Pupils: Pupils are equal, round, and reactive to light.  Neck:     Vascular: No carotid bruit.  Cardiovascular:     Rate and Rhythm: Normal rate and regular rhythm.     Pulses: Normal pulses.     Heart sounds: Normal heart sounds. No murmur heard.   No friction rub. No gallop.  Pulmonary:     Effort: Pulmonary effort is normal. No respiratory distress.     Breath sounds: Normal breath sounds. No wheezing, rhonchi or rales.  Chest:     Chest wall: No tenderness.  Abdominal:     General: Bowel sounds are normal. There is no distension.     Palpations: Abdomen is soft. There is no mass.     Tenderness: There is no abdominal tenderness. There is no right CVA tenderness, left CVA tenderness, guarding or rebound.  Genitourinary:    Exam position: Lithotomy position.     Labia:        Right: No rash, tenderness or lesion.        Left: No tenderness.      Comments: Small area swollen without any erythema,drainage or tenderness  Musculoskeletal:        General: No swelling or tenderness.  Normal range of motion.     Cervical back: Normal range of motion. No rigidity or tenderness.     Right lower leg: No edema.     Left lower leg: No edema.  Lymphadenopathy:     Cervical: No cervical adenopathy.  Skin:    General: Skin is warm and dry.     Coloration: Skin is not pale.     Findings: No bruising, erythema, lesion or rash.  Neurological:     Mental Status: She is alert. Mental status is at baseline.     Cranial Nerves: No cranial nerve deficit.     Sensory: No sensory deficit.     Motor: No weakness.     Coordination: Coordination normal.     Gait: Gait abnormal.  Psychiatric:        Mood and Affect: Mood normal.        Speech: Speech normal.        Behavior: Behavior normal.        Thought Content: Thought content normal.        Judgment: Judgment normal.    Labs reviewed: Recent Labs    09/28/20 1100 11/17/20 0808 12/21/20 1009  NA 139 139 141  K 4.4 4.4 4.5  CL 102 104 104  CO2 27 28 29   GLUCOSE 134* 95 107*  BUN 38* 24 21  CREATININE 0.97* 0.70 0.85  CALCIUM 10.8* 9.8 10.6*   Recent Labs    06/20/20  1007 09/18/20 1210 12/21/20 1009  AST 26 26 24   ALT 26 22 21   ALKPHOS 68 48 53  BILITOT 0.3 0.4 0.3  PROT 7.8 7.1 7.3  ALBUMIN 4.0 4.0 3.9   Recent Labs    09/28/20 1100 11/17/20 0808 12/21/20 1009  WBC 9.7 5.7 8.8  NEUTROABS 6,712 3,500 6.2  HGB 14.0 12.7 12.8  HCT 42.7 39.6 37.9  MCV 87.9 89.0 87.3  PLT 135* 128* 141*   Lab Results  Component Value Date   TSH 0.67 11/17/2020   No results found for: HGBA1C Lab Results  Component Value Date   CHOL 171 11/17/2020   HDL 63 11/17/2020   LDLCALC 90 11/17/2020   TRIG 86 11/17/2020   CHOLHDL 2.7 11/17/2020    Significant Diagnostic Results in last 30 days:  No results found.  Assessment/Plan 1. Do not resuscitate On Vynca updated to EPIC - Do not attempt resuscitation (DNR)  2. Need for influenza vaccination Afebrile. Flut shot administered by CMA no acute reaction  reported.  - Flu Vaccine QUAD High Dose(Fluad)  3. Chronic bilateral low back pain with bilateral sciatica Chronic but has worsen.with treat with tapered Prednisone SE discussed. - Ambulatory referral to Pain Clinic - predniSONE (DELTASONE) 10 MG tablet; Take 4 tablets (40 mg total) by mouth daily with breakfast for 1 day, THEN 3 tablets (30 mg total) daily with breakfast for 1 day, THEN 2 tablets (20 mg total) daily with breakfast for 1 day, THEN 1 tablet (10 mg total) daily with breakfast for 1 day, THEN 0.5 tablets (5 mg total) daily with breakfast for 1 day.  Dispense: 10.5 tablet; Refill: 0 - Follow up with Orthopedic as scheduled  - Notify provider if symptoms worsen or fail to improve   4. Weakness of both lower extremities Has worsen due to sciatic pain  Decline ED evaluation Tapered prednisone as above  - Follow up with Orthopedic as scheduled   5. Folliculitis of perineum Left vulva area small pea size swelling non-tender to touch and without any erythema or drainage.suspect possible hair follicle.advised to apply warm wet wash cloth compressor for 5-15 minutes.Notify provider if symptoms worsen or fail to improve.  Will treat with doxycycline if infected  Family/ staff Communication: Reviewed plan of care with patient and daughter verbalized understanding  Labs/tests ordered: None   Next Appointment: As needed if symptoms worsen or fail to improve    Sandrea Hughs, NP

## 2021-01-17 NOTE — Patient Instructions (Addendum)
Take Prednisone as directed   - Follow up with Orthopedic as scheduled   - Referral ordered for pain management clinic specialist office will call you for appointment   - Notify provider if symptoms worsen or fail to improve

## 2021-01-19 ENCOUNTER — Encounter: Payer: Self-pay | Admitting: Pulmonary Disease

## 2021-01-19 ENCOUNTER — Ambulatory Visit (INDEPENDENT_AMBULATORY_CARE_PROVIDER_SITE_OTHER): Payer: Medicare Other | Admitting: Pulmonary Disease

## 2021-01-19 ENCOUNTER — Other Ambulatory Visit: Payer: Self-pay

## 2021-01-19 VITALS — BP 124/64 | HR 67 | Ht 59.0 in | Wt 118.4 lb

## 2021-01-19 DIAGNOSIS — D869 Sarcoidosis, unspecified: Secondary | ICD-10-CM

## 2021-01-19 LAB — PULMONARY FUNCTION TEST
DL/VA % pred: 87 %
DL/VA: 3.69 ml/min/mmHg/L
DLCO cor % pred: 73 %
DLCO cor: 11.48 ml/min/mmHg
DLCO unc % pred: 72 %
DLCO unc: 11.26 ml/min/mmHg
FEF 25-75 Post: 3.29 L/sec
FEF 25-75 Pre: 2.81 L/sec
FEF2575-%Change-Post: 17 %
FEF2575-%Pred-Post: 400 %
FEF2575-%Pred-Pre: 341 %
FEV1-%Change-Post: 5 %
FEV1-%Pred-Post: 172 %
FEV1-%Pred-Pre: 164 %
FEV1-Post: 1.75 L
FEV1-Pre: 1.67 L
FEV1FVC-%Change-Post: 3 %
FEV1FVC-%Pred-Pre: 117 %
FEV6-%Change-Post: 1 %
FEV6-%Pred-Post: 154 %
FEV6-%Pred-Pre: 152 %
FEV6-Post: 1.93 L
FEV6-Pre: 1.9 L
FEV6FVC-%Pred-Post: 106 %
FEV6FVC-%Pred-Pre: 106 %
FVC-%Change-Post: 1 %
FVC-%Pred-Post: 144 %
FVC-%Pred-Pre: 142 %
FVC-Post: 1.93 L
FVC-Pre: 1.9 L
Post FEV1/FVC ratio: 91 %
Post FEV6/FVC ratio: 100 %
Pre FEV1/FVC ratio: 88 %
Pre FEV6/FVC Ratio: 100 %
RV % pred: 67 %
RV: 1.5 L
TLC % pred: 78 %
TLC: 3.36 L

## 2021-01-19 MED ORDER — ALBUTEROL SULFATE HFA 108 (90 BASE) MCG/ACT IN AERS
2.0000 | INHALATION_SPRAY | Freq: Four times a day (QID) | RESPIRATORY_TRACT | 6 refills | Status: DC | PRN
Start: 2021-01-19 — End: 2022-02-23

## 2021-01-19 NOTE — Patient Instructions (Signed)
Try albuterol inhaler 1-2 puffs as needed every 4-6 hours for shortness of breath  You have mild restrictive defect and diffusion defect on pulmonary function tests which are consistent with sarcoidosis as noted on your CT chest scan  We will check pulmonary function tests in 1 year

## 2021-01-19 NOTE — Progress Notes (Signed)
PFT done today. 

## 2021-01-19 NOTE — Progress Notes (Signed)
Synopsis: Referred in May 2022 for abnormal CT chest results  Subjective:   PATIENT ID: [name] GENDER: female DOB: December 12, 1933, MRN: [id number]  HPI  Chief Complaint  Patient presents with   Follow-up    F/U after PFT. States her breathing has been stable since last visit.    [name] is an 87 year old woman, never smoker with HFrEF, dementia, hypertension and osteoarthritis who returns to pulmonary clinic for abnormal lung findings on the lower lung fields after CT renal stone study on 06/08/20.  We obtained a HRCT chest scan 09/09/20 which showed extensive clustered, confluent, somewhat ill-defined nodularity throughout the lungs, many nodules concentrated along the fissures with upper lobe predominance. These findings are consistent with sarcoidosis with a mild component of fibrosis.  Pulmonary function tests show mild restrictive and mild diffusion defects.  She denies any issues with shortness of breath.   OV 08/16/20 There were bilateral scattered ground glass opacities noted on the CT renal stone study. She denies any shortness of breath or wheezing. She does have an intermittent cough. She reports trouble with her swallowing in which she had barium swallow completed on 07/27/20 that showed mild aspiration risk with minimal pharyngoesophageal dysphagia. She denies recurrent pneumonias.   She does report diffuse joint pains since she was in her 75s. She reports this is osteoarthritis. She does have a history of GERD and is taking omeprazole daily. She denies frequent reflux symptoms and does not have nocturnal symptoms that wake her up from sleep.  Past Medical History:  Diagnosis Date   Arthritis    Cancer ([place])    left breast cancer    Cataracts, bilateral    removed by surgery   CHF (congestive heart failure) ([place])    PACEMAKER & DEFIB   Complication of anesthesia    hypotensive after back surgery in 2006   Depression    Dyslipidemia    Fainted 04/21/06    AT [facility]   GERD (gastroesophageal reflux disease)    Headache(784.0)    Hearing loss    bilateral hearing aids   HLD (hyperlipidemia)    diet controlled    Hypertension    Hypothyroidism    ICD (implantable cardiac defibrillator) in place    pt has pacer/icd   ICD (implantable cardiac defibrillator), biventricular, in situ    LBBB (left bundle branch block)    Memory loss    Nonischemic cardiomyopathy ([place])    Normal coronary arteries    s/p cardiac cath 2007   Pacemaker    ICD [employer]   Syncope    Systolic CHF [facility])    Vertigo    Wears glasses      Family History  Problem Relation Age of Onset   Hypertension Mother    Arthritis Mother    Hypertension Father    Hypertension Brother    Hypertension Brother      Social History   Socioeconomic History   Marital status: Married    Spouse name: Not on file   Number of children: 1   Years of education: Masters   Highest education level: Not on file  Occupational History   Occupation: Retired  Tobacco Use   Smoking status: Never   Smokeless tobacco: Never  Vaping Use   Vaping Use: Never used  Substance and Sexual Activity   Alcohol use: No   Drug use: No   Sexual activity: Not Currently    Birth control/protection: Post-menopausal  Other Topics Concern  Not on file  Social History Narrative   Lives at home with husband.   Right-handed.      As of 07/28/2014   Diet: No special diet   Caffeine: yes, Chocolate, tea and sodas    Married: YES, 1970   House: Yes, 2 stories, 2-3 persons live in home   Pets: No   Current/Past profession: Engineer, mining, Designer, jewellery    Exercise: Yes 2-3 x weekly   Living Will: Yes   DNR: No   POA/HPOA: No      Social Determinants of Radio broadcast assistant Strain: Not on file  Food Insecurity: Not on file  Transportation Needs: Not on file  Physical Activity: Not on file  Stress: Not on file  Social Connections: Not on file  Intimate Partner  Violence: Not on file     Allergies  Allergen Reactions   Iodine Shortness Of Breath    Iodine contrast, CHF , SOB   Shellfish Allergy Shortness Of Breath   Memantine     Malaise, fogginess/ couldn't think   Aspirin Nausea And Vomiting   Codeine Nausea And Vomiting     Outpatient Medications Prior to Visit  Medication Sig Dispense Refill   acetaminophen (TYLENOL) 500 MG tablet Take 500 mg by mouth in the morning and at bedtime. 500 mg in the morning and afternoon     acetaminophen (TYLENOL) 650 MG CR tablet Take 650 mg by mouth at bedtime.     amLODipine (NORVASC) 5 MG tablet Take 0.5 tablets (2.5 mg total) by mouth daily. 90 tablet 1   ascorbic acid (VITAMIN C) 500 MG tablet Take 500 mg by mouth daily.     buPROPion (WELLBUTRIN XL) 150 MG 24 hr tablet Take 1 tablet (150 mg total) by mouth daily.     carvedilol (COREG) 3.125 MG tablet TAKE 1 TABLET BY MOUTH  TWICE DAILY 180 tablet 3   Cholecalciferol (VITAMIN D3) 50 MCG (2000 UT) TABS Take 2,000 Units by mouth daily.      diclofenac Sodium (VOLTAREN) 1 % GEL Apply 4 g topically 4 (four) times daily. 100 g 2   fluticasone (FLONASE) 50 MCG/ACT nasal spray Place 2 sprays into both nostrils daily. 16 g 3   furosemide (LASIX) 20 MG tablet TAKE 1 TABLET BY MOUTH  DAILY 90 tablet 3   levothyroxine (SYNTHROID, LEVOTHROID) 100 MCG tablet Take 1 tablet (100 mcg total) by mouth daily before breakfast. 90 tablet 3   Lidocaine 4 % PTCH Apply 1 patch topically every 12 (twelve) hours. Apply to back     Magnesium 250 MG TABS Take 250 mg by mouth daily.     meclizine (ANTIVERT) 12.5 MG tablet Take 1 tablet (12.5 mg total) by mouth 3 (three) times daily as needed for dizziness. 30 tablet 1   methocarbamol (ROBAXIN) 500 MG tablet Take 1 tablet (500 mg total) by mouth every 8 (eight) hours as needed. 90 tablet 1   mirabegron ER (MYRBETRIQ) 50 MG TB24 tablet Take 1 tablet (50 mg total) by mouth daily. 30 tablet 3   NONFORMULARY OR COMPOUNDED ITEM Apply  120 Tubes topically daily. Diclofenac/Cyclobenzaprine/lamotrigine/lidocaine/prilocaine (10480) 2%/2%/6%/5%/1.25% cream QTY: 120 GM SIG: (NEURO) APPLY 1-2 PUMPS (1-2 GMS) TO AFFECTED AREA (S) OR FOCAL POINTS 3 TO 4 TIMES DAILY. RUB IN FOR 2 MINUTES TO ACHIEVE MAX PENETRATION. [place] HANDS WELL. 1 each 2   Omega-3 Fatty Acids (FISH OIL PO) Take 1 capsule by mouth daily.     omeprazole (PRILOSEC)  20 MG capsule Take 2 capsules (40 mg total) by mouth daily. 180 capsule 1   Oxcarbazepine (TRILEPTAL) 300 MG tablet Take 300 mg by mouth daily.      polyethylene glycol powder (GLYCOLAX/MIRALAX) 17 GM/SCOOP powder Take 1 Container by mouth as needed.     potassium chloride (KLOR-CON) 10 MEQ tablet TAKE 1 TABLET BY MOUTH  DAILY 90 tablet 3   predniSONE (DELTASONE) 10 MG tablet Take 4 tablets (40 mg total) by mouth daily with breakfast for 1 day, THEN 3 tablets (30 mg total) daily with breakfast for 1 day, THEN 2 tablets (20 mg total) daily with breakfast for 1 day, THEN 1 tablet (10 mg total) daily with breakfast for 1 day, THEN 0.5 tablets (5 mg total) daily with breakfast for 1 day. 10.5 tablet 0   senna-docusate (SENOKOT S) 8.6-50 MG tablet Take 2 tablets by mouth daily. 60 tablet 5   sertraline (ZOLOFT) 100 MG tablet Take 200 mg by mouth daily.      spironolactone (ALDACTONE) 25 MG tablet TAKE 1 TABLET BY MOUTH IN  THE MORNING 90 tablet 3   Vitamin E 400 units TABS Take 400 Units by mouth daily.      No facility-administered medications prior to visit.   Review of Systems  Constitutional:  Negative for chills, fever, malaise/fatigue and weight loss.  HENT:  Negative for congestion, sinus pain and sore throat.   Eyes: Negative.   Respiratory:  Negative for cough, hemoptysis, sputum production, shortness of breath and wheezing.   Cardiovascular:  Negative for chest pain, palpitations, orthopnea, claudication and leg swelling.  Gastrointestinal:  Negative for abdominal pain, heartburn, nausea and vomiting.   Genitourinary: Negative.   Musculoskeletal:  Positive for back pain and joint pain. Negative for myalgias.  Skin:  Negative for rash.  Neurological:  Negative for weakness.  Endo/Heme/Allergies: Negative.   Psychiatric/Behavioral: Negative.     Objective:   Vitals:   01/19/21 1337  BP: 124/64  Pulse: 67  SpO2: 98%  Weight: 118 lb 6.4 oz (53.7 kg)  Height: 4\' 11"  (1.499 m)     Physical Exam Constitutional:      General: She is not in acute distress.    Appearance: Normal appearance. She is normal weight. She is not ill-appearing.     Comments: Elderly woman  HENT:     Head: Normocephalic and atraumatic.  Eyes:     General: No scleral icterus.    Conjunctiva/sclera: Conjunctivae normal.     Pupils: Pupils are equal, round, and reactive to light.  Cardiovascular:     Rate and Rhythm: Normal rate and regular rhythm.     Pulses: Normal pulses.     Heart sounds: Normal heart sounds. No murmur heard. Pulmonary:     Effort: Pulmonary effort is normal.     Breath sounds: Normal breath sounds. No wheezing, rhonchi or rales.  Musculoskeletal:     Right lower leg: No edema.     Left lower leg: No edema.  Skin:    General: Skin is warm and dry.  Neurological:     General: No focal deficit present.     Mental Status: She is alert.    CBC    Component Value Date/Time   WBC 8.8 12/21/2020 1009   RBC 4.34 12/21/2020 1009   HGB 12.8 12/21/2020 1009   HGB 13.7 06/20/2020 0944   HGB 12.7 10/05/2015 0945   HGB 12.5 08/24/2011 1332   HCT 37.9 12/21/2020 1009   HCT  39.4 10/05/2015 0945   HCT 37.5 08/24/2011 1332   PLT 141 (L) 12/21/2020 1009   PLT 124 (L) 06/20/2020 0944   PLT 129 (L) 10/05/2015 0945   MCV 87.3 12/21/2020 1009   MCV 87 10/05/2015 0945   MCV 87.1 08/24/2011 1332   MCH 29.5 12/21/2020 1009   MCHC 33.8 12/21/2020 1009   RDW 14.9 12/21/2020 1009   RDW 15.3 10/05/2015 0945   RDW 15.6 (H) 08/24/2011 1332   LYMPHSABS 1.3 12/21/2020 1009   LYMPHSABS 1.9  10/05/2015 0945   LYMPHSABS 2.5 08/24/2011 1332   MONOABS 0.9 12/21/2020 1009   MONOABS 0.6 08/24/2011 1332   EOSABS 0.2 12/21/2020 1009   EOSABS 0.3 10/05/2015 0945   BASOSABS 0.1 12/21/2020 1009   BASOSABS 0.0 10/05/2015 0945   BASOSABS 0.1 08/24/2011 1332   BMP Latest Ref Rng & Units 12/21/2020 11/17/2020 09/28/2020  Glucose 70 - 99 mg/dL 107(H) 95 134(H)  BUN 8 - 23 mg/dL 21 24 38(H)  Creatinine 0.44 - 1.00 mg/dL 0.85 0.70 0.97(H)  BUN/Creat Ratio 6 - 22 (calc) - NOT APPLICABLE 63(J)  Sodium 135 - 145 mmol/L 141 139 139  Potassium 3.5 - 5.1 mmol/L 4.5 4.4 4.4  Chloride 98 - 111 mmol/L 104 104 102  CO2 22 - 32 mmol/L 29 28 27   Calcium 8.9 - 10.3 mg/dL 10.6(H) 9.8 10.8(H)   Chest imaging: HRCT Chest 09/09/20 1. There is very extensive, clustered, confluent, somewhat ill-defined nodularity throughout the lungs, many nodules concentrated along the fissures and with a slight upper lobe predominance. There is some associated irregular peripheral interstitial opacity. 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 Unusual appearance and distribution of an atypical infection is a differential consideration. 2. Coronary artery disease.  CT Renal Stone Study 06/09/31 Lower chest: Bilateral ground-glass opacities peripherally within both lower lungs. Heart is normal size. Pacer wires noted in the heart. No effusions.  PFT: PFT Results Latest Ref Rng & Units 01/19/2021  FVC-Pre L 1.90  FVC-Predicted Pre % 142  FVC-Post L 1.93  FVC-Predicted Post % 144  Pre FEV1/FVC % % 88  Post FEV1/FCV % % 91  FEV1-Pre L 1.67  FEV1-Predicted Pre % 164  FEV1-Post L 1.75  DLCO uncorrected ml/min/mmHg 11.26  DLCO UNC% % 72  DLCO corrected ml/min/mmHg 11.48  DLCO COR %Predicted % 73  DLVA Predicted % 87  TLC L 3.36  TLC % Predicted % 78  RV % Predicted % 67    Labs:  Path:  Echo 08/26/13: EF 45-50%, mild global hypokinesis. Incoordinate septal motion.    ICD  wires noted. Diastolic dysfunction with normal LV filling    pressure.  Assessment & Plan:   Sarcoidosis - Plan: albuterol (VENTOLIN HFA) 108 (90 Base) MCG/ACT inhaler, Pulmonary function test  Discussion: [name] is an 87 year old woman, never smoker with HFrEF, dementia, hypertension and osteoarthritis who returns to pulmonary clinic for abnormal lung findings on the lower lung fields after CT renal stone study on 06/08/20.  Her CT Chest findings are most consistent with sarcoidosis which has likely been present for sometime prior to discovering these findings through her abdominal imaging. She has findings of mild fibrosis as well which could be from history of chronic dysphagia and aspiration.  Her pulmonary function tests show mild restriction and diffusion defect. She is asymptomatic at this time given her level of functioning. We will continue to monitor her symptoms. She can try albuterol as needed for cough or shortness  of breath.   She is to follow up in 1 year.   Freda Jackson, MD [facility] Office: [phone]   Current Outpatient Medications:    acetaminophen (TYLENOL) 500 MG tablet, Take 500 mg by mouth in the morning and at bedtime. 500 mg in the morning and afternoon, Disp: , Rfl:    acetaminophen (TYLENOL) 650 MG CR tablet, Take 650 mg by mouth at bedtime., Disp: , Rfl:    albuterol (VENTOLIN HFA) 108 (90 Base) MCG/ACT inhaler, Inhale 2 puffs into the lungs every 6 (six) hours as needed for wheezing or shortness of breath., Disp: 8 g, Rfl: 6   amLODipine (NORVASC) 5 MG tablet, Take 0.5 tablets (2.5 mg total) by mouth daily., Disp: 90 tablet, Rfl: 1   ascorbic acid (VITAMIN C) 500 MG tablet, Take 500 mg by mouth daily., Disp: , Rfl:    buPROPion (WELLBUTRIN XL) 150 MG 24 hr tablet, Take 1 tablet (150 mg total) by mouth daily., Disp: , Rfl:    carvedilol (COREG) 3.125 MG tablet, TAKE 1 TABLET BY MOUTH  TWICE DAILY, Disp: 180 tablet, Rfl: 3    Cholecalciferol (VITAMIN D3) 50 MCG (2000 UT) TABS, Take 2,000 Units by mouth daily. , Disp: , Rfl:    diclofenac Sodium (VOLTAREN) 1 % GEL, Apply 4 g topically 4 (four) times daily., Disp: 100 g, Rfl: 2   fluticasone (FLONASE) 50 MCG/ACT nasal spray, Place 2 sprays into both nostrils daily., Disp: 16 g, Rfl: 3   furosemide (LASIX) 20 MG tablet, TAKE 1 TABLET BY MOUTH  DAILY, Disp: 90 tablet, Rfl: 3   levothyroxine (SYNTHROID, LEVOTHROID) 100 MCG tablet, Take 1 tablet (100 mcg total) by mouth daily before breakfast., Disp: 90 tablet, Rfl: 3   Lidocaine 4 % PTCH, Apply 1 patch topically every 12 (twelve) hours. Apply to back, Disp: , Rfl:    Magnesium 250 MG TABS, Take 250 mg by mouth daily., Disp: , Rfl:    meclizine (ANTIVERT) 12.5 MG tablet, Take 1 tablet (12.5 mg total) by mouth 3 (three) times daily as needed for dizziness., Disp: 30 tablet, Rfl: 1   methocarbamol (ROBAXIN) 500 MG tablet, Take 1 tablet (500 mg total) by mouth every 8 (eight) hours as needed., Disp: 90 tablet, Rfl: 1   mirabegron ER (MYRBETRIQ) 50 MG TB24 tablet, Take 1 tablet (50 mg total) by mouth daily., Disp: 30 tablet, Rfl: 3   NONFORMULARY OR COMPOUNDED ITEM, Apply 120 Tubes topically daily. Diclofenac/Cyclobenzaprine/lamotrigine/lidocaine/prilocaine (10480) 2%/2%/6%/5%/1.25% cream QTY: 120 GM SIG: (NEURO) APPLY 1-2 PUMPS (1-2 GMS) TO AFFECTED AREA (S) OR FOCAL POINTS 3 TO 4 TIMES DAILY. RUB IN FOR 2 MINUTES TO ACHIEVE MAX PENETRATION. [place] HANDS WELL., Disp: 1 each, Rfl: 2   Omega-3 Fatty Acids (FISH OIL PO), Take 1 capsule by mouth daily., Disp: , Rfl:    omeprazole (PRILOSEC) 20 MG capsule, Take 2 capsules (40 mg total) by mouth daily., Disp: 180 capsule, Rfl: 1   Oxcarbazepine (TRILEPTAL) 300 MG tablet, Take 300 mg by mouth daily. , Disp: , Rfl:    polyethylene glycol powder (GLYCOLAX/MIRALAX) 17 GM/SCOOP powder, Take 1 Container by mouth as needed., Disp: , Rfl:    potassium chloride (KLOR-CON) 10 MEQ tablet, TAKE 1  TABLET BY MOUTH  DAILY, Disp: 90 tablet, Rfl: 3   senna-docusate (SENOKOT S) 8.6-50 MG tablet, Take 2 tablets by mouth daily., Disp: 60 tablet, Rfl: 5   sertraline (ZOLOFT) 100 MG tablet, Take 200 mg by mouth daily. , Disp: ,  Rfl:    spironolactone (ALDACTONE) 25 MG tablet, TAKE 1 TABLET BY MOUTH IN  THE MORNING, Disp: 90 tablet, Rfl: 3   Vitamin E 400 units TABS, Take 400 Units by mouth daily. , Disp: , Rfl:

## 2021-01-25 ENCOUNTER — Encounter: Payer: Self-pay | Admitting: Rehabilitation

## 2021-01-25 ENCOUNTER — Other Ambulatory Visit: Payer: Self-pay

## 2021-01-25 ENCOUNTER — Ambulatory Visit: Payer: Medicare Other | Admitting: Rehabilitation

## 2021-01-25 DIAGNOSIS — I972 Postmastectomy lymphedema syndrome: Secondary | ICD-10-CM | POA: Diagnosis not present

## 2021-01-25 DIAGNOSIS — M25512 Pain in left shoulder: Secondary | ICD-10-CM

## 2021-01-25 DIAGNOSIS — Z483 Aftercare following surgery for neoplasm: Secondary | ICD-10-CM

## 2021-01-25 DIAGNOSIS — M25612 Stiffness of left shoulder, not elsewhere classified: Secondary | ICD-10-CM

## 2021-01-25 NOTE — Therapy (Signed)
[facility] @ [place], [place], [address] Phone: [phone]   Fax:  [phone]  Physical Therapy Treatment  Patient Details  Name: [name] MRN: [id number] Date of Birth: Jul 09, 1933 Referring Provider (PT): Dr. Barry Dienes (Dr Barry Dienes is surgeon)   Encounter Date: 01/25/2021   PT End of Session - 01/25/21 1204     Visit Number 2    Number of Visits 9    Date for PT Re-Evaluation 02/13/21    PT Start Time 7    PT Stop Time 1150    PT Time Calculation (min) 48 min    Activity Tolerance Patient tolerated treatment well    Behavior During Therapy [facility] for tasks assessed/performed             Past Medical History:  Diagnosis Date   Arthritis    Cancer ([place])    left breast cancer    Cataracts, bilateral    removed by surgery   CHF (congestive heart failure) ([place])    PACEMAKER & DEFIB   Complication of anesthesia    hypotensive after back surgery in 2006   Depression    Dyslipidemia    Fainted 04/21/06   AT [facility]   GERD (gastroesophageal reflux disease)    Headache(784.0)    Hearing loss    bilateral hearing aids   HLD (hyperlipidemia)    diet controlled    Hypertension    Hypothyroidism    ICD (implantable cardiac defibrillator) in place    pt has pacer/icd   ICD (implantable cardiac defibrillator), biventricular, in situ    LBBB (left bundle branch block)    Memory loss    Nonischemic cardiomyopathy ([place])    Normal coronary arteries    s/p cardiac cath 2007   Pacemaker    ICD [employer]   Syncope    Systolic CHF [facility])    Vertigo    Wears glasses     Past Surgical History:  Procedure Laterality Date   BACK SURGERY     lumbar fusion    BREAST LUMPECTOMY Left 2008   BREAST SURGERY  2000   LUMP REMOVAL. STAGE 1 CANCER   CARDIAC CATHETERIZATION     CATARACT EXTRACTION     COLONOSCOPY     EYE SURGERY     IMPLANTABLE CARDIOVERTER DEFIBRILLATOR GENERATOR CHANGE  N/A 12/18/2012   Procedure: IMPLANTABLE CARDIOVERTER DEFIBRILLATOR GENERATOR CHANGE;  Surgeon: [name], MD;  Location: [facility] CATH LAB;  Service: Cardiovascular;  Laterality: N/A;   JOINT REPLACEMENT  06/14/01   right   LUMBAR FUSION  2006   MASS EXCISION  11/08/2011   Procedure: EXCISION MASS;  Surgeon: [name], MD;  Location: [facility];  Service: General;  Laterality: Left;  Excision Left Thigh Mass   MASTECTOMY W/ SENTINEL NODE BIOPSY Left 06/04/2019   Procedure: LEFT MASTECTOMY WITH SENTINEL LYMPH NODE BIOPSY;  Surgeon: [name], MD;  Location: [facility];  Service: General;  Laterality: Left;   MASTECTOMY, PARTIAL  2008   GOT PACEMAKER AND DEFIB AT THAT TIME   PACEMAKER INSERTION  04/23/06   TOTAL KNEE ARTHROPLASTY  05/17/01   RIGHT KNEE   TOTAL KNEE ARTHROPLASTY Left 11/29/2014   Procedure: TOTAL LEFT KNEE ARTHROPLASTY;  Surgeon: Paralee Cancel, MD;  Location: [facility];  Service: Orthopedics;  Laterality: Left;    There were no vitals filed for this visit.   Subjective Assessment - 01/25/21 1101     Subjective maybe  a little pain but better today    Pertinent History left breast cancer with left mastectomy on 06/04/2019 with 5 lymph nodes . past history includes lumpectomy in 2008 with radiation and no lymph nodes removed she has chronic left breast pain and seroma with excisional biopsy in 2011.  Hx includes CHR with implanted defibrillator in left chest, bilateral TKR, osteoporosis. Pt is currently having back pain    Currently in Pain? Yes    Pain Score 5     Pain Location Axilla    Pain Orientation Left;Posterior    Pain Descriptors / Indicators Aching    Pain Type Chronic pain    Pain Onset More than a month ago    Pain Frequency Constant                               [facility] Adult PT Treatment/Exercise - 01/25/21 0001       Exercises   Exercises Other Exercises    Other Exercises  pt not feeling much in supine overhead flexion AAROM so we switched to  standing single arm doorway flexion which pt has already been doing alot at home.  Pt is having severe LBP with intermittent spasm with position changes.  Discussed possible back stretches but pt reports she has tried these and they all cause spasm.  Pt is supposed to meet with a pain specialist but seems to be limited availability.  MRI shows mild to severe OA and foraminal narrowing above and below lumbar L3-5 fusion      Manual Therapy   Soft tissue mobilization in supine and sidelying, with cocoa butter, at posterior shoulder and axilla with focus at cording along posterior tricep region    Manual Lymphatic Drainage (MLD) breifly, stationary circles to left upper arm in supine and sidelying                          PT Long Term Goals - 01/12/21 1403       PT LONG TERM GOAL #1   Title Pt will have reduction in left upper arm at 10 cm proximal  to olecranon by 1 cm    Baseline 27 m on 01/12/2021    Time 4    Period Weeks    Status New      PT LONG TERM GOAL #2   Title Pt will report the pain in her left axilla is a 3/5 at rest    Baseline 01/12/2021 5/10    Time 4    Period Weeks    Status New      PT LONG TERM GOAL #3   Title Pt will be independent in a basic shoulder ROM exercise program    Time 4    Period Weeks    Status New                   Plan - 01/25/21 1204     Clinical Impression Statement Pt with 2 good sized cords in the left axilla evident in overhead radiation position.  The most prominent and painful one is along the posterior axilla/tricep region meeting up with the end of the incision.  Pt is also very tight anterior chest and incision and does have the large defibrillator present but does not seem to bother any shoulder movements.  Pt reports decreased pain post MT    PT Frequency 2x / week  PT Duration 4 weeks    PT Treatment/Interventions ADLs/Self Care Home Management;DME Instruction;Therapeutic activities;Patient/family  education;Orthotic Fit/Training;Manual techniques;Manual lymph drainage;Compression bandaging;Scar mobilization;Passive range of motion;Therapeutic exercise    PT Next Visit Plan Manual work to tightness/cording at posterior shoulder /axilla with MLD to left upper arm. AAROM, may like pulleys    PT Home Exercise Plan overhead single arm door flexion    Recommended Other Services pt is happy with current compression garments    Consulted and Agree with Plan of Care Patient             Patient will benefit from skilled therapeutic intervention in order to improve the following deficits and impairments:  Decreased activity tolerance, Decreased knowledge of precautions, Decreased knowledge of use of DME, Decreased range of motion, Decreased strength, Increased fascial restricitons, Increased edema, Impaired perceived functional ability, Increased muscle spasms, Impaired UE functional use, Postural dysfunction, Pain  Visit Diagnosis: Postmastectomy lymphedema  Acute pain of left shoulder  Aftercare following surgery for neoplasm  Stiffness of left shoulder joint     Problem List Patient Active Problem List   Diagnosis Date Noted   Dementia without behavioral disturbance ([place]) 02/04/2020   Urinary retention 02/04/2020   Seasonal allergies 02/04/2020   Recurrent major depressive disorder, in partial remission ([place]) 07/17/2019   Status post left mastectomy 07/01/2019   Breast cancer of lower-outer quadrant of left female breast ([place]) 06/04/2019   Candida infection, oral 01/15/2019   Senile purpura ([place]) 04/14/2018   Lumbar post-laminectomy syndrome 12/03/2017   Lumbar spondylosis 12/03/2017   History of back surgery 09/01/2017   Constipation 09/01/2017   Hypothyroidism due to acquired atrophy of thyroid 09/01/2017   Mixed hyperlipidemia 09/01/2017   Age-related osteoporosis without current pathological fracture 09/01/2017   High risk medication use 09/01/2017   Arthritis of hand  05/17/2017   Atherosclerosis of native arteries of extremity with intermittent claudication ([place]) 05/15/2017   Status post total bilateral knee replacement 12/20/2016   Gait abnormality 07/16/2016   Chronic low back pain 07/16/2016   Mild cognitive impairment 07/16/2016   Wrist pain 05/10/2015   S/P left TKA 11/29/2014   S/P knee replacement 11/29/2014   Spontaneous bruising 08/27/2014   Numbness and tingling in right hand 07/28/2014   Essential tremor 07/28/2014   Dizziness 05/28/2014   Shaky 05/28/2014   Memory loss 05/28/2014   Depression 05/06/2014   Lipoma of left upper thigh 3x5 cm 09/28/2011   Cerebral vascular accident ([place]) 08/09/2011   Syncope 08/09/2011   History of breast cancer T1bNxMx, s/p BCT 2008, triple negative 01/26/2011   Chronic L breast pain with chronic recurrent seroma, s/p excisional biopsy 01/12/2010 01/26/2011   Fainted    ICD (implantable cardioverter-defibrillator), biventricular, in situ 94/17/4081   Chronic systolic heart failure ([place]) 08/02/2010   Hypertension 08/02/2010    Stark Bray, PT 01/25/2021, 12:07 PM  [facility] @ [place], [place], [address] Phone: [phone]   Fax:  [phone]  Name: [name] MRN: [id number] Date of Birth: May 02, 1933

## 2021-01-26 ENCOUNTER — Encounter: Payer: Self-pay | Admitting: Physical Medicine and Rehabilitation

## 2021-01-30 ENCOUNTER — Ambulatory Visit: Payer: Medicare Other

## 2021-01-30 ENCOUNTER — Other Ambulatory Visit: Payer: Self-pay

## 2021-01-30 DIAGNOSIS — M25612 Stiffness of left shoulder, not elsewhere classified: Secondary | ICD-10-CM

## 2021-01-30 DIAGNOSIS — I972 Postmastectomy lymphedema syndrome: Secondary | ICD-10-CM

## 2021-01-30 DIAGNOSIS — Z483 Aftercare following surgery for neoplasm: Secondary | ICD-10-CM

## 2021-01-30 DIAGNOSIS — M25512 Pain in left shoulder: Secondary | ICD-10-CM

## 2021-01-30 NOTE — Therapy (Signed)
[facility] @ [place], [place], [address] Phone: [phone]   Fax:  [phone]  Physical Therapy Treatment  Patient Details  Name: [name] MRN: [id number] Date of Birth: 1934/02/18 Referring Provider (PT): Dr. Barry Dienes (Dr Barry Dienes is surgeon)   Encounter Date: 01/30/2021   PT End of Session - 01/30/21 1220     Visit Number 3    Number of Visits 9    Date for PT Re-Evaluation 02/13/21    PT Start Time 1109    PT Stop Time 1204    PT Time Calculation (min) 55 min    Activity Tolerance Patient tolerated treatment well    Behavior During Therapy [facility] for tasks assessed/performed             Past Medical History:  Diagnosis Date   Arthritis    Cancer ([place])    left breast cancer    Cataracts, bilateral    removed by surgery   CHF (congestive heart failure) ([place])    PACEMAKER & DEFIB   Complication of anesthesia    hypotensive after back surgery in 2006   Depression    Dyslipidemia    Fainted 04/21/06   AT [facility]   GERD (gastroesophageal reflux disease)    Headache(784.0)    Hearing loss    bilateral hearing aids   HLD (hyperlipidemia)    diet controlled    Hypertension    Hypothyroidism    ICD (implantable cardiac defibrillator) in place    pt has pacer/icd   ICD (implantable cardiac defibrillator), biventricular, in situ    LBBB (left bundle branch block)    Memory loss    Nonischemic cardiomyopathy ([place])    Normal coronary arteries    s/p cardiac cath 2007   Pacemaker    ICD [employer]   Syncope    Systolic CHF [facility])    Vertigo    Wears glasses     Past Surgical History:  Procedure Laterality Date   BACK SURGERY     lumbar fusion    BREAST LUMPECTOMY Left 2008   BREAST SURGERY  2000   LUMP REMOVAL. STAGE 1 CANCER   CARDIAC CATHETERIZATION     CATARACT EXTRACTION     COLONOSCOPY     EYE SURGERY     IMPLANTABLE CARDIOVERTER DEFIBRILLATOR GENERATOR CHANGE  N/A 12/18/2012   Procedure: IMPLANTABLE CARDIOVERTER DEFIBRILLATOR GENERATOR CHANGE;  Surgeon: [name], MD;  Location: [facility] CATH LAB;  Service: Cardiovascular;  Laterality: N/A;   JOINT REPLACEMENT  06/14/01   right   LUMBAR FUSION  2006   MASS EXCISION  11/08/2011   Procedure: EXCISION MASS;  Surgeon: [name], MD;  [facility];  Service: General;  Laterality: Left;  Excision Left Thigh Mass   MASTECTOMY W/ SENTINEL NODE BIOPSY Left 06/04/2019   Procedure: LEFT MASTECTOMY WITH SENTINEL LYMPH NODE BIOPSY;  Surgeon: [name], MD;  Location: [facility];  Service: General;  Laterality: Left;   MASTECTOMY, PARTIAL  2008   GOT PACEMAKER AND DEFIB AT THAT TIME   PACEMAKER INSERTION  04/23/06   TOTAL KNEE ARTHROPLASTY  05/17/01   RIGHT KNEE   TOTAL KNEE ARTHROPLASTY Left 11/29/2014   Procedure: TOTAL LEFT KNEE ARTHROPLASTY;  Surgeon: Paralee Cancel, MD;  [facility];  Service: Orthopedics;  Laterality: Left;    There were no vitals filed for this visit.   Subjective Assessment - 01/30/21 1111     Subjective I'm  just feeling a little stiff this morning which is normal for me.    Pertinent History left breast cancer with left mastectomy on 06/04/2019 with 5 lymph nodes . past history includes lumpectomy in 2008 with radiation and no lymph nodes removed she has chronic left breast pain and seroma with excisional biopsy in 2011.  Hx includes CHR with implanted defibrillator in left chest, bilateral TKR, osteoporosis. Pt is currently having back pain    Patient Stated Goals to get rid to the pain and tightness in her left axilla    Currently in Pain? No/denies                               [facility] Adult PT Treatment/Exercise - 01/30/21 0001       Manual Therapy   Manual Therapy Soft tissue mobilization;Myofascial release;Manual Lymphatic Drainage (MLD);Passive ROM    Soft tissue mobilization in supine and sidelying, with cocoa butter, at Lt lateral trunk/lateral border  of scapula, medial scapula border where tightness palpated and axilla    Myofascial Release In Supine to Lt axilla at area of cording lateral to pacemaker during P/ROM    Manual Lymphatic Drainage (MLD) In Supine: Short neck, 5 diaphragmatic breaths, Lt inguinal nodes and Lt upper arm, then retracing steps    Passive ROM In Supine to Lt shoulder into flexion and abduction with STM to Lt lateral trunk where tightness palpable.                          PT Long Term Goals - 01/12/21 1403       PT LONG TERM GOAL #1   Title Pt will have reduction in left upper arm at 10 cm proximal  to olecranon by 1 cm    Baseline 27 m on 01/12/2021    Time 4    Period Weeks    Status New      PT LONG TERM GOAL #2   Title Pt will report the pain in her left axilla is a 3/5 at rest    Baseline 01/12/2021 5/10    Time 4    Period Weeks    Status New      PT LONG TERM GOAL #3   Title Pt will be independent in a basic shoulder ROM exercise program    Time 4    Period Weeks    Status New                   Plan - 01/30/21 1224     Clinical Impression Statement Continued with manual therapy focusing on decreasing tightness at Lt lateral trunk and scapular area. Some improvement noted by end of session as her Lt shoulder P/ROM was improved and tightness some lessened. Reminded pt of importance of incorporating end Lt shoulder ROM stretches into her day. Also educated pts daughter in same with pts permission.    Personal Factors and Comorbidities Age    Comorbidities previous triple negataive left breast cancer, seroma after lumpectomy, CHF with defibrillator, Bilateral TKR osteoporosis    Examination-Activity Limitations Reach Overhead;Bathing    Stability/Clinical Decision Making Stable/Uncomplicated    Rehab Potential Good    PT Frequency 2x / week    PT Duration 4 weeks    PT Treatment/Interventions ADLs/Self Care Home Management;DME Instruction;Therapeutic  activities;Patient/family education;Orthotic Fit/Training;Manual techniques;Manual lymph drainage;Compression bandaging;Scar mobilization;Passive range of motion;Therapeutic exercise    PT  Next Visit Plan Manual work to tightness/cording at posterior shoulder /axilla with MLD to left upper arm. AA/ROM, may like pulleys    PT Home Exercise Plan overhead single arm door flexion    Consulted and Agree with Plan of Care Patient             Patient will benefit from skilled therapeutic intervention in order to improve the following deficits and impairments:  Decreased activity tolerance, Decreased knowledge of precautions, Decreased knowledge of use of DME, Decreased range of motion, Decreased strength, Increased fascial restricitons, Increased edema, Impaired perceived functional ability, Increased muscle spasms, Impaired UE functional use, Postural dysfunction, Pain  Visit Diagnosis: Postmastectomy lymphedema  Acute pain of left shoulder  Aftercare following surgery for neoplasm  Stiffness of left shoulder joint     Problem List Patient Active Problem List   Diagnosis Date Noted   Dementia without behavioral disturbance ([place]) 02/04/2020   Urinary retention 02/04/2020   Seasonal allergies 02/04/2020   Recurrent major depressive disorder, in partial remission ([place]) 07/17/2019   Status post left mastectomy 07/01/2019   Breast cancer of lower-outer quadrant of left female breast ([place]) 06/04/2019   Candida infection, oral 01/15/2019   Senile purpura ([place]) 04/14/2018   Lumbar post-laminectomy syndrome 12/03/2017   Lumbar spondylosis 12/03/2017   History of back surgery 09/01/2017   Constipation 09/01/2017   Hypothyroidism due to acquired atrophy of thyroid 09/01/2017   Mixed hyperlipidemia 09/01/2017   Age-related osteoporosis without current pathological fracture 09/01/2017   High risk medication use 09/01/2017   Arthritis of hand 05/17/2017   Atherosclerosis of native arteries  of extremity with intermittent claudication ([place]) 05/15/2017   Status post total bilateral knee replacement 12/20/2016   Gait abnormality 07/16/2016   Chronic low back pain 07/16/2016   Mild cognitive impairment 07/16/2016   Wrist pain 05/10/2015   S/P left TKA 11/29/2014   S/P knee replacement 11/29/2014   Spontaneous bruising 08/27/2014   Numbness and tingling in right hand 07/28/2014   Essential tremor 07/28/2014   Dizziness 05/28/2014   Shaky 05/28/2014   Memory loss 05/28/2014   Depression 05/06/2014   Lipoma of left upper thigh 3x5 cm 09/28/2011   Cerebral vascular accident ([place]) 08/09/2011   Syncope 08/09/2011   History of breast cancer T1bNxMx, s/p BCT 2008, triple negative 01/26/2011   Chronic L breast pain with chronic recurrent seroma, s/p excisional biopsy 01/12/2010 01/26/2011   Fainted    ICD (implantable cardioverter-defibrillator), biventricular, in situ 40/34/7425   Chronic systolic heart failure ([place]) 08/02/2010   Hypertension 08/02/2010    Otelia Limes, PTA 01/30/2021, 12:34 PM  [facility] @ [place], [place], [address] Phone: [phone]   Fax:  [phone]  Name: [name] MRN: [id number] Date of Birth: October 23, 1933

## 2021-02-02 ENCOUNTER — Other Ambulatory Visit: Payer: Self-pay

## 2021-02-02 ENCOUNTER — Ambulatory Visit (INDEPENDENT_AMBULATORY_CARE_PROVIDER_SITE_OTHER): Payer: Medicare Other

## 2021-02-02 ENCOUNTER — Ambulatory Visit: Payer: Medicare Other | Admitting: Neurology

## 2021-02-02 ENCOUNTER — Ambulatory Visit: Payer: Medicare Other

## 2021-02-02 DIAGNOSIS — I972 Postmastectomy lymphedema syndrome: Secondary | ICD-10-CM

## 2021-02-02 DIAGNOSIS — I5022 Chronic systolic (congestive) heart failure: Secondary | ICD-10-CM

## 2021-02-02 DIAGNOSIS — I428 Other cardiomyopathies: Secondary | ICD-10-CM | POA: Diagnosis not present

## 2021-02-02 DIAGNOSIS — M25512 Pain in left shoulder: Secondary | ICD-10-CM

## 2021-02-02 DIAGNOSIS — M25612 Stiffness of left shoulder, not elsewhere classified: Secondary | ICD-10-CM

## 2021-02-02 DIAGNOSIS — Z483 Aftercare following surgery for neoplasm: Secondary | ICD-10-CM

## 2021-02-02 LAB — CUP PACEART REMOTE DEVICE CHECK
Battery Remaining Longevity: 30 mo
Battery Remaining Percentage: 47 %
Brady Statistic RA Percent Paced: 0 %
Brady Statistic RV Percent Paced: 100 %
Date Time Interrogation Session: 20221027072800
HighPow Impedance: 50 Ohm
Implantable Lead Implant Date: 20080116
Implantable Lead Implant Date: 20080116
Implantable Lead Implant Date: 20080116
Implantable Lead Location: 753859
Implantable Lead Location: 753860
Implantable Lead Location: 753860
Implantable Lead Model: 157
Implantable Lead Model: 4469
Implantable Lead Model: 4555
Implantable Lead Serial Number: 136532
Implantable Lead Serial Number: 161542
Implantable Lead Serial Number: 473495
Implantable Pulse Generator Implant Date: 20140911
Lead Channel Impedance Value: 406 Ohm
Lead Channel Impedance Value: 523 Ohm
Lead Channel Impedance Value: 938 Ohm
Lead Channel Pacing Threshold Amplitude: 0.5 V
Lead Channel Pacing Threshold Amplitude: 0.6 V
Lead Channel Pacing Threshold Amplitude: 0.6 V
Lead Channel Pacing Threshold Pulse Width: 0.4 ms
Lead Channel Pacing Threshold Pulse Width: 0.4 ms
Lead Channel Pacing Threshold Pulse Width: 0.8 ms
Lead Channel Setting Pacing Amplitude: 2 V
Lead Channel Setting Pacing Amplitude: 2 V
Lead Channel Setting Pacing Amplitude: 2.4 V
Lead Channel Setting Pacing Pulse Width: 0.4 ms
Lead Channel Setting Pacing Pulse Width: 0.8 ms
Lead Channel Setting Sensing Sensitivity: 0.6 mV
Lead Channel Setting Sensing Sensitivity: 1 mV
Pulse Gen Serial Number: 111765

## 2021-02-02 NOTE — Therapy (Signed)
[facility] @ [address] [place], [place], [address] Phone: [phone]   Fax:  [phone]  Physical Therapy Treatment  Patient Details  Name: Rewa Weissberg MRN: [id number] Date of Birth: [birth year] Referring Provider (PT): Dr. Barry Dienes (Dr Barry Dienes is surgeon)   Encounter Date: 02/02/2021   PT End of Session - 02/02/21 1215     Visit Number 4    Number of Visits 9    Date for PT Re-Evaluation 02/13/21    PT Start Time 1108    PT Stop Time 1215    PT Time Calculation (min) 67 min    Activity Tolerance Patient tolerated treatment well    Behavior During Therapy [facility] for tasks assessed/performed             Past Medical History:  Diagnosis Date   Arthritis    Cancer ([place])    left breast cancer    Cataracts, bilateral    removed by surgery   CHF (congestive heart failure) ([place])    PACEMAKER & DEFIB   Complication of anesthesia    hypotensive after back surgery in 2006   Depression    Dyslipidemia    Fainted 04/21/06   AT [facility]   GERD (gastroesophageal reflux disease)    Headache(784.0)    Hearing loss    bilateral hearing aids   HLD (hyperlipidemia)    diet controlled    Hypertension    Hypothyroidism    ICD (implantable cardiac defibrillator) in place    pt has pacer/icd   ICD (implantable cardiac defibrillator), biventricular, in situ    LBBB (left bundle branch block)    Memory loss    Nonischemic cardiomyopathy ([place])    Normal coronary arteries    s/p cardiac cath 2007   Pacemaker    ICD [employer]   Syncope    Systolic CHF [facility])    Vertigo    Wears glasses     Past Surgical History:  Procedure Laterality Date   BACK SURGERY     lumbar fusion    BREAST LUMPECTOMY Left 2008   BREAST SURGERY  2000   LUMP REMOVAL. STAGE 1 CANCER   CARDIAC CATHETERIZATION     CATARACT EXTRACTION     COLONOSCOPY     EYE SURGERY     IMPLANTABLE CARDIOVERTER DEFIBRILLATOR GENERATOR CHANGE N/A  12/18/2012   Procedure: IMPLANTABLE CARDIOVERTER DEFIBRILLATOR GENERATOR CHANGE;  Surgeon: [name], MD;  Location: [facility] CATH LAB;  Service: Cardiovascular;  Laterality: N/A;   JOINT REPLACEMENT  06/14/01   right   LUMBAR FUSION  2006   MASS EXCISION  11/08/2011   Procedure: EXCISION MASS;  Surgeon: [name], MD;  Location: [facility];  Service: General;  Laterality: Left;  Excision Left Thigh Mass   MASTECTOMY W/ SENTINEL NODE BIOPSY Left 06/04/2019   Procedure: LEFT MASTECTOMY WITH SENTINEL LYMPH NODE BIOPSY;  Surgeon: [name], MD;  Location: [place];  Service: General;  Laterality: Left;   MASTECTOMY, PARTIAL  2008   GOT PACEMAKER AND DEFIB AT THAT TIME   PACEMAKER INSERTION  04/23/06   TOTAL KNEE ARTHROPLASTY  05/17/01   RIGHT KNEE   TOTAL KNEE ARTHROPLASTY Left 11/29/2014   Procedure: TOTAL LEFT KNEE ARTHROPLASTY;  Surgeon: Paralee Cancel, MD;  Location: [facility];  Service: Orthopedics;  Laterality: Left;    There were no vitals filed for this visit.   Subjective Assessment - 02/02/21 1128     Subjective I felt  good after last session. My body in general feels sore today but that's normal for me. I've been doing the mastring strthces you showed me and I am so tight! I think that's why my back spasms.    Pertinent History left breast cancer with left mastectomy on 06/04/2019 with 5 lymph nodes . past history includes lumpectomy in 2008 with radiation and no lymph nodes removed she has chronic left breast pain and seroma with excisional biopsy in 2011.  Hx includes CHR with implanted defibrillator in left chest, bilateral TKR, osteoporosis. Pt is currently having back pain    Patient Stated Goals to get rid to the pain and tightness in her left axilla    Currently in Pain? Yes    Pain Score 5     Pain Location Other (Comment)   "my whole body today'   Pain Orientation Left;Posterior    Pain Descriptors / Indicators Aching;Dull    Pain Type Chronic pain    Pain Onset More than a month ago     Pain Frequency Intermittent    Aggravating Factors  I always feel tighter in the morning    Pain Relieving Factors as I move more during the day                               [facility] Adult PT Treatment/Exercise - 02/02/21 0001       Exercises   Other Exercises  In Supine for Meeks Decompression Exercises, pt returned therapist demo for each 5x, 5 sec holds. VCs required for correct muscle engagement and to prevent overcompensation.      Manual Therapy   Manual Therapy Soft tissue mobilization;Myofascial release;Manual Lymphatic Drainage (MLD);Passive ROM    Soft tissue mobilization in supine and sidelying Lt lateral trunk/lateral border of scapula, medial scapula border where tightness palpated but this improved today    Myofascial Release In Supine to Lt axilla at area of cording lateral to pacemaker during P/ROM    Passive ROM In Supine to Lt shoulder into flexion and abduction with STM to Lt lateral trunk where tightness palpable.                     PT Education - 02/02/21 1135     Education Details Meeks Decompression    Person(s) Educated Patient    Methods Explanation;Demonstration;Handout    Comprehension Verbalized understanding;Returned demonstration;Need further instruction                 PT Long Term Goals - 01/12/21 1403       PT LONG TERM GOAL #1   Title Pt will have reduction in left upper arm at 10 cm proximal  to olecranon by 1 cm    Baseline 27 m on 01/12/2021    Time 4    Period Weeks    Status New      PT LONG TERM GOAL #2   Title Pt will report the pain in her left axilla is a 3/5 at rest    Baseline 01/12/2021 5/10    Time 4    Period Weeks    Status New      PT LONG TERM GOAL #3   Title Pt will be independent in a basic shoulder ROM exercise program    Time 4    Period Weeks    Status New  Plan - 02/02/21 1218     Clinical Impression Statement Progressed pt to include pulleys  and ball roll on mat table. Also added modified downward dog on wall which pt tolerated well but started having HS spasms and had to stretch to ease the discomfort. Then continued manual therapy working to decrease Lt upper quadrant tightness. Focused on STM to Lt lateral trunk and scap mobs with STM to medial scapular border, but htis area was much improved today. Cording still palpable at Lt axilla and towards posterior UE that does decrease some during manual therapy but pt repotrs this tighens back up. Encouraged her to incr freq of stretching during day. Also progressed pt to include Meeks Decompression Exs to see if this will help lessen her spasms and improve posture. Pt returned good demo but will benefit from review of technique.    Personal Factors and Comorbidities Age    Comorbidities previous triple negataive left breast cancer, seroma after lumpectomy, CHF with defibrillator, Bilateral TKR osteoporosis    Examination-Activity Limitations Reach Overhead;Bathing    Stability/Clinical Decision Making Stable/Uncomplicated    Rehab Potential Good    PT Frequency 2x / week    PT Duration 4 weeks    PT Treatment/Interventions ADLs/Self Care Home Management;DME Instruction;Therapeutic activities;Patient/family education;Orthotic Fit/Training;Manual techniques;Manual lymph drainage;Compression bandaging;Scar mobilization;Passive range of motion;Therapeutic exercise    PT Next Visit Plan Manual work to tightness/cording at posterior shoulder /axilla with MLD to left upper arm. Cont AA/ROM    PT Home Exercise Plan overhead single arm door flexion; Meeks Decopmression Exs    Consulted and Agree with Plan of Care Patient             Patient will benefit from skilled therapeutic intervention in order to improve the following deficits and impairments:  Decreased activity tolerance, Decreased knowledge of precautions, Decreased knowledge of use of DME, Decreased range of motion, Decreased strength,  Increased fascial restricitons, Increased edema, Impaired perceived functional ability, Increased muscle spasms, Impaired UE functional use, Postural dysfunction, Pain  Visit Diagnosis: Postmastectomy lymphedema  Acute pain of left shoulder  Aftercare following surgery for neoplasm  Stiffness of left shoulder joint     Problem List Patient Active Problem List   Diagnosis Date Noted   Dementia without behavioral disturbance ([place]) 02/04/2020   Urinary retention 02/04/2020   Seasonal allergies 02/04/2020   Recurrent major depressive disorder, in partial remission ([place]) 07/17/2019   Status post left mastectomy 07/01/2019   Breast cancer of lower-outer quadrant of left female breast ([place]) 06/04/2019   Candida infection, oral 01/15/2019   Senile purpura ([place]) 04/14/2018   Lumbar post-laminectomy syndrome 12/03/2017   Lumbar spondylosis 12/03/2017   History of back surgery 09/01/2017   Constipation 09/01/2017   Hypothyroidism due to acquired atrophy of thyroid 09/01/2017   Mixed hyperlipidemia 09/01/2017   Age-related osteoporosis without current pathological fracture 09/01/2017   High risk medication use 09/01/2017   Arthritis of hand 05/17/2017   Atherosclerosis of native arteries of extremity with intermittent claudication ([place]) 05/15/2017   Status post total bilateral knee replacement 12/20/2016   Gait abnormality 07/16/2016   Chronic low back pain 07/16/2016   Mild cognitive impairment 07/16/2016   Wrist pain 05/10/2015   S/P left TKA 11/29/2014   S/P knee replacement 11/29/2014   Spontaneous bruising 08/27/2014   Numbness and tingling in right hand 07/28/2014   Essential tremor 07/28/2014   Dizziness 05/28/2014   Shaky 05/28/2014   Memory loss 05/28/2014   Depression 05/06/2014   Lipoma  of left upper thigh 3x5 cm 09/28/2011   Cerebral vascular accident ([place]) 08/09/2011   Syncope 08/09/2011   History of breast cancer T1bNxMx, s/p BCT 2008, triple negative 01/26/2011    Chronic L breast pain with chronic recurrent seroma, s/p excisional biopsy 01/12/2010 01/26/2011   Fainted    ICD (implantable cardioverter-defibrillator), biventricular, in situ 45/36/4680   Chronic systolic heart failure ([place]) 08/02/2010   Hypertension 08/02/2010    Otelia Limes, PTA 02/02/2021, 12:56 PM  [facility] @ [address] [place], [place], [address] Phone: [phone]   Fax:  [phone]  Name: Zettie Gootee MRN: [id number] Date of Birth: Jun 19, 1933

## 2021-02-02 NOTE — Patient Instructions (Signed)

## 2021-02-06 ENCOUNTER — Ambulatory Visit: Payer: Medicare Other

## 2021-02-06 ENCOUNTER — Encounter: Payer: Self-pay | Admitting: Pulmonary Disease

## 2021-02-06 ENCOUNTER — Other Ambulatory Visit: Payer: Self-pay

## 2021-02-06 ENCOUNTER — Telehealth: Payer: Self-pay | Admitting: Neurology

## 2021-02-06 DIAGNOSIS — M25612 Stiffness of left shoulder, not elsewhere classified: Secondary | ICD-10-CM

## 2021-02-06 DIAGNOSIS — I972 Postmastectomy lymphedema syndrome: Secondary | ICD-10-CM | POA: Diagnosis not present

## 2021-02-06 DIAGNOSIS — Z483 Aftercare following surgery for neoplasm: Secondary | ICD-10-CM

## 2021-02-06 DIAGNOSIS — M25512 Pain in left shoulder: Secondary | ICD-10-CM

## 2021-02-06 NOTE — Telephone Encounter (Signed)
Pt will discharge from [facility] today. She has met all of her goals.

## 2021-02-06 NOTE — Therapy (Signed)
[facility] @ [address] [place], [place], [address] Phone: [phone]   Fax:  [phone]  Physical Therapy Treatment  Patient Details  Name: [name] MRN: [id number] Date of Birth: [birth year] Referring Provider (PT): Dr. Barry Dienes (Dr Barry Dienes is surgeon)   Encounter Date: 02/06/2021   PT End of Session - 02/06/21 1213     Visit Number 5    Number of Visits 9    Date for PT Re-Evaluation 02/13/21    PT Start Time 1104    PT Stop Time 1205    PT Time Calculation (min) 61 min    Activity Tolerance Patient tolerated treatment well    Behavior During Therapy [facility] for tasks assessed/performed             Past Medical History:  Diagnosis Date   Arthritis    Cancer ([place])    left breast cancer    Cataracts, bilateral    removed by surgery   CHF (congestive heart failure) ([place])    PACEMAKER & DEFIB   Complication of anesthesia    hypotensive after back surgery in 2006   Depression    Dyslipidemia    Fainted 04/21/06   AT [facility]   GERD (gastroesophageal reflux disease)    Headache(784.0)    Hearing loss    bilateral hearing aids   HLD (hyperlipidemia)    diet controlled    Hypertension    Hypothyroidism    ICD (implantable cardiac defibrillator) in place    pt has pacer/icd   ICD (implantable cardiac defibrillator), biventricular, in situ    LBBB (left bundle branch block)    Memory loss    Nonischemic cardiomyopathy ([place])    Normal coronary arteries    s/p cardiac cath 2007   Pacemaker    ICD [employer]   Syncope    Systolic CHF [facility])    Vertigo    Wears glasses     Past Surgical History:  Procedure Laterality Date   BACK SURGERY     lumbar fusion    BREAST LUMPECTOMY Left 2008   BREAST SURGERY  2000   LUMP REMOVAL. STAGE 1 CANCER   CARDIAC CATHETERIZATION     CATARACT EXTRACTION     COLONOSCOPY     EYE SURGERY     IMPLANTABLE CARDIOVERTER DEFIBRILLATOR GENERATOR CHANGE N/A  12/18/2012   Procedure: IMPLANTABLE CARDIOVERTER DEFIBRILLATOR GENERATOR CHANGE;  Surgeon: [name], MD;  Location: [facility] CATH LAB;  Service: Cardiovascular;  Laterality: N/A;   JOINT REPLACEMENT  06/14/01   right   LUMBAR FUSION  2006   MASS EXCISION  11/08/2011   Procedure: EXCISION MASS;  Surgeon: [name], MD;  [facility];  Service: General;  Laterality: Left;  Excision Left Thigh Mass   MASTECTOMY W/ SENTINEL NODE BIOPSY Left 06/04/2019   Procedure: LEFT MASTECTOMY WITH SENTINEL LYMPH NODE BIOPSY;  Surgeon: [name], MD;  Location: [facility];  Service: General;  Laterality: Left;   MASTECTOMY, PARTIAL  2008   GOT PACEMAKER AND DEFIB AT THAT TIME   PACEMAKER INSERTION  04/23/06   TOTAL KNEE ARTHROPLASTY  05/17/01   RIGHT KNEE   TOTAL KNEE ARTHROPLASTY Left 11/29/2014   Procedure: TOTAL LEFT KNEE ARTHROPLASTY;  Surgeon: Paralee Cancel, MD;  [facility];  Service: Orthopedics;  Laterality: Left;    There were no vitals filed for this visit.   Subjective Assessment - 02/06/21 1110     Subjective My Lt  shoulder is starting to feel a little looser and my pain is improving.    Pertinent History left breast cancer with left mastectomy on 06/04/2019 with 5 lymph nodes . past history includes lumpectomy in 2008 with radiation and no lymph nodes removed she has chronic left breast pain and seroma with excisional biopsy in 2011.  Hx includes CHR with implanted defibrillator in left chest, bilateral TKR, osteoporosis. Pt is currently having back pain    Patient Stated Goals to get rid to the pain and tightness in her left axilla    Currently in Pain? No/denies                               [facility] Adult PT Treatment/Exercise - 02/06/21 0001       Shoulder Exercises: Pulleys   Flexion 2 minutes    Flexion Limitations Pt with improved motion today, near full AA/ROM    ABduction 2 minutes    ABduction Limitations VCs to remind pt to decrease Lt scapular compensation       Shoulder Exercises: Therapy Ball   Flexion Both;10 reps   forward lean into end of stretch     Manual Therapy   Manual Therapy Soft tissue mobilization;Myofascial release;Manual Lymphatic Drainage (MLD);Passive ROM    Soft tissue mobilization in supine and Rt sidelying to Lt lateral trunk from incision to lateral border of scapula, then medial scapula border where tightness palpated but this improved today    Myofascial Release In Supine to Lt axilla at area of cording lateral to pacemaker during P/ROM; also to posterior arm down to elbow where cording palpable and pt reports feeling tighter today    Passive ROM In Supine to Lt shoulder into flexion and abduction with STM to Lt lateral trunk where tightness palpable; scapular depression throughout                          PT Long Term Goals - 01/12/21 1403       PT LONG TERM GOAL #1   Title Pt will have reduction in left upper arm at 10 cm proximal  to olecranon by 1 cm    Baseline 27 m on 01/12/2021    Time 4    Period Weeks    Status New      PT LONG TERM GOAL #2   Title Pt will report the pain in her left axilla is a 3/5 at rest    Baseline 01/12/2021 5/10    Time 4    Period Weeks    Status New      PT LONG TERM GOAL #3   Title Pt will be independent in a basic shoulder ROM exercise program    Time 4    Period Weeks    Status New                   Plan - 02/06/21 1213     Clinical Impression Statement Continued with AA/ROM exercises. Pt is showing improvement with her flexion on th epulleys as this was near full today. Then continued with manual therapy to her Lt upper quadrant working to decreasing cording in Lt axilla and upper arm along with STM to Lt lateral trunk and scapular area that are all limiing her end A/ROM at this time. The tightness, though, along her lateral trunk is improved some today from last session. Pt reports she  sees Dr. Nelva Bush for her LBP for injections as she has a hx of  fusions and her low back to bil hamstrings has been spasming more as of late. This therapist will discuss the possiblity of adding LBP to pts POC with the PT that she sees next which is [place]. Pt was agreeable to this.    Personal Factors and Comorbidities Age    Comorbidities previous triple negataive left breast cancer, seroma after lumpectomy, CHF with defibrillator, Bilateral TKR osteoporosis    Examination-Activity Limitations Reach Overhead;Bathing    Stability/Clinical Decision Making Stable/Uncomplicated    Rehab Potential Good    PT Frequency 2x / week    PT Duration 4 weeks    PT Treatment/Interventions ADLs/Self Care Home Management;DME Instruction;Therapeutic activities;Patient/family education;Orthotic Fit/Training;Manual techniques;Manual lymph drainage;Compression bandaging;Scar mobilization;Passive range of motion;Therapeutic exercise    PT Next Visit Plan Manual work to tightness/cording at posterior shoulder /axilla with MLD to left upper arm. Cont AA/ROM; assess and add LBP to POC??    PT Home Exercise Plan overhead single arm door flexion; Meeks Decopmression Exs    Consulted and Agree with Plan of Care Patient             Patient will benefit from skilled therapeutic intervention in order to improve the following deficits and impairments:  Decreased activity tolerance, Decreased knowledge of precautions, Decreased knowledge of use of DME, Decreased range of motion, Decreased strength, Increased fascial restricitons, Increased edema, Impaired perceived functional ability, Increased muscle spasms, Impaired UE functional use, Postural dysfunction, Pain  Visit Diagnosis: Postmastectomy lymphedema  Acute pain of left shoulder  Aftercare following surgery for neoplasm  Stiffness of left shoulder joint     Problem List Patient Active Problem List   Diagnosis Date Noted   Dementia without behavioral disturbance ([place]) 02/04/2020   Urinary retention 02/04/2020    Seasonal allergies 02/04/2020   Recurrent major depressive disorder, in partial remission ([place]) 07/17/2019   Status post left mastectomy 07/01/2019   Breast cancer of lower-outer quadrant of left female breast ([place]) 06/04/2019   Candida infection, oral 01/15/2019   Senile purpura ([place]) 04/14/2018   Lumbar post-laminectomy syndrome 12/03/2017   Lumbar spondylosis 12/03/2017   History of back surgery 09/01/2017   Constipation 09/01/2017   Hypothyroidism due to acquired atrophy of thyroid 09/01/2017   Mixed hyperlipidemia 09/01/2017   Age-related osteoporosis without current pathological fracture 09/01/2017   High risk medication use 09/01/2017   Arthritis of hand 05/17/2017   Atherosclerosis of native arteries of extremity with intermittent claudication ([place]) 05/15/2017   Status post total bilateral knee replacement 12/20/2016   Gait abnormality 07/16/2016   Chronic low back pain 07/16/2016   Mild cognitive impairment 07/16/2016   Wrist pain 05/10/2015   S/P left TKA 11/29/2014   S/P knee replacement 11/29/2014   Spontaneous bruising 08/27/2014   Numbness and tingling in right hand 07/28/2014   Essential tremor 07/28/2014   Dizziness 05/28/2014   Shaky 05/28/2014   Memory loss 05/28/2014   Depression 05/06/2014   Lipoma of left upper thigh 3x5 cm 09/28/2011   Cerebral vascular accident ([place]) 08/09/2011   Syncope 08/09/2011   History of breast cancer T1bNxMx, s/p BCT 2008, triple negative 01/26/2011   Chronic L breast pain with chronic recurrent seroma, s/p excisional biopsy 01/12/2010 01/26/2011   Fainted    ICD (implantable cardioverter-defibrillator), biventricular, in situ 96/78/9381   Chronic systolic heart failure ([place]) 08/02/2010   Hypertension 08/02/2010    Otelia Limes, PTA 02/06/2021, 12:30 PM  [facility] @ [place] [place], [place], [address] Phone: [phone]   Fax:  [phone]  Name:  Delma Drone MRN: [id number] Date of Birth: May 16, 1933

## 2021-02-08 ENCOUNTER — Encounter: Payer: Self-pay | Admitting: Rehabilitation

## 2021-02-08 ENCOUNTER — Other Ambulatory Visit: Payer: Self-pay

## 2021-02-08 ENCOUNTER — Ambulatory Visit: Payer: Medicare Other | Attending: General Surgery | Admitting: Rehabilitation

## 2021-02-08 DIAGNOSIS — I972 Postmastectomy lymphedema syndrome: Secondary | ICD-10-CM | POA: Diagnosis present

## 2021-02-08 DIAGNOSIS — M25612 Stiffness of left shoulder, not elsewhere classified: Secondary | ICD-10-CM | POA: Diagnosis present

## 2021-02-08 DIAGNOSIS — Z483 Aftercare following surgery for neoplasm: Secondary | ICD-10-CM | POA: Insufficient documentation

## 2021-02-08 DIAGNOSIS — M25512 Pain in left shoulder: Secondary | ICD-10-CM | POA: Insufficient documentation

## 2021-02-08 NOTE — Therapy (Signed)
[facility] [address] [place], [place], [address] Phone: [phone]   Fax:  [phone]  Physical Therapy Treatment  Patient Details  Name: Tyesha Joffe MRN: [id number] Date of Birth: [birth year] Referring Provider (PT): Dr. Barry Dienes (Dr Barry Dienes is surgeon)   Encounter Date: 02/08/2021   PT End of Session - 02/08/21 1202     Visit Number 6    Number of Visits 9    Date for PT Re-Evaluation 02/13/21    PT Start Time 87    PT Stop Time 1200    PT Time Calculation (min) 60 min    Activity Tolerance Patient tolerated treatment well    Behavior During Therapy [facility] for tasks assessed/performed             Past Medical History:  Diagnosis Date   Arthritis    Cancer ([place])    left breast cancer    Cataracts, bilateral    removed by surgery   CHF (congestive heart failure) ([place])    PACEMAKER & DEFIB   Complication of anesthesia    hypotensive after back surgery in 2006   Depression    Dyslipidemia    Fainted 04/21/06   AT [facility]   GERD (gastroesophageal reflux disease)    Headache(784.0)    Hearing loss    bilateral hearing aids   HLD (hyperlipidemia)    diet controlled    Hypertension    Hypothyroidism    ICD (implantable cardiac defibrillator) in place    pt has pacer/icd   ICD (implantable cardiac defibrillator), biventricular, in situ    LBBB (left bundle branch block)    Memory loss    Nonischemic cardiomyopathy ([place])    Normal coronary arteries    s/p cardiac cath 2007   Pacemaker    ICD [employer]   Syncope    Systolic CHF [facility])    Vertigo    Wears glasses     Past Surgical History:  Procedure Laterality Date   BACK SURGERY     lumbar fusion    BREAST LUMPECTOMY Left 2008   BREAST SURGERY  2000   LUMP REMOVAL. STAGE 1 CANCER   CARDIAC CATHETERIZATION     CATARACT EXTRACTION     COLONOSCOPY     EYE SURGERY     IMPLANTABLE CARDIOVERTER DEFIBRILLATOR GENERATOR CHANGE N/A  12/18/2012   Procedure: IMPLANTABLE CARDIOVERTER DEFIBRILLATOR GENERATOR CHANGE;  Surgeon: [name], MD;  Location: [facility] CATH LAB;  Service: Cardiovascular;  Laterality: N/A;   JOINT REPLACEMENT  06/14/01   right   LUMBAR FUSION  2006   MASS EXCISION  11/08/2011   Procedure: EXCISION MASS;  Surgeon: [name], MD;  [facility]: [facility];  Service: General;  Laterality: Left;  Excision Left Thigh Mass   MASTECTOMY W/ SENTINEL NODE BIOPSY Left 06/04/2019   Procedure: LEFT MASTECTOMY WITH SENTINEL LYMPH NODE BIOPSY;  Surgeon: [name], MD;  Location: [facility];  Service: General;  Laterality: Left;   MASTECTOMY, PARTIAL  2008   GOT PACEMAKER AND DEFIB AT THAT TIME   PACEMAKER INSERTION  04/23/06   TOTAL KNEE ARTHROPLASTY  05/17/01   RIGHT KNEE   TOTAL KNEE ARTHROPLASTY Left 11/29/2014   Procedure: TOTAL LEFT KNEE ARTHROPLASTY;  Surgeon: Paralee Cancel, MD;  [facility];  Service: Orthopedics;  Laterality: Left;    There were no vitals filed for this visit.   Subjective Assessment - 02/08/21 1056     Subjective I have  been having back pain.  I have had it forever.  I started my injections again x 2 and it only works for a few weeks.  I get tightness across the low back and then I get pain down both legs.  It hurts every day all the time.  It gets worse with bending and lifting.  It feels the same unless I am laying down for bed. When I get out of bed or go from sit to stand I get a spasm and pain.  The Rt leg can feel weak too.  Sometimes I have pain that lasts 5-56min before I can walk.  I take mm relaxers as well and it makes no difference. I use salon pas patches.  Stretching seems to make them worse.  I tried PT before and got some exercises and I would just get more spasms.    Pertinent History left breast cancer with left mastectomy on 06/04/2019 with 5 lymph nodes . past history includes lumpectomy in 2008 with radiation and no lymph nodes removed she has chronic left breast pain and seroma with  excisional biopsy in 2011.  Hx includes CHR with implanted defibrillator in left chest, bilateral TKR, osteoporosis. Pt is currently having back pain    Diagnostic tests Prior posterior decompression at L3 and L4 with posterior and  interbody fusion at L4-L5. No evidence of hardware complication. No  acute lumbar spine fracture. Multilevel degenerative disc disease and facet arthritis above and  below the fusion, with varying degrees of mild-moderate spinal canal  and moderate-severe neural foraminal narrowing as described above.    Currently in Pain? Yes    Pain Score 7     Pain Location Back   the whole body except the arms   Pain Orientation Mid    Pain Descriptors / Indicators Aching    Pain Type Chronic pain    Pain Onset More than a month ago    Pain Frequency Constant                [facility] PT Assessment - 02/08/21 0001       AROM   Left Shoulder Flexion 135 Degrees   pull at the cord in the tricep region   Left Shoulder ABduction 130 Degrees   pain in posterior shoulder along cord     Palpation   Palpation comment still very tender posterior axilla with cord here                           [facility] Adult PT Treatment/Exercise - 02/08/21 0001       Self-Care   Self-Care Other Self-Care Comments    Other Self-Care Comments  discussed status of back MRI and status of low back severity.  Also how pt has tried PT in the past and when she attempts to do any of the stretches and exercises she has increased back spasm.  Discussed how therapy here would be similar to last time that was unsuccessful.  Discussed pelvic posture briefly upon exiting with pt having trouble moving here so we may try this next time.      Shoulder Exercises: Sidelying   ABduction Left;5 reps    Other Sidelying Exercises with scapular pop but not painful      Shoulder Exercises: Pulleys   Flexion 2 minutes    ABduction 2 minutes      Manual Therapy   Soft tissue mobilization in supine  and Rt sidelying  to Lt lateral trunk from incision to lateral border of scapula, then medial scapula border where tightness palpated but this improved today    Myofascial Release In Supine to Lt axilla at area of cording lateral to pacemaker during P/ROM; also to posterior arm down to elbow where cording palpable and pt reports feeling tighter today    Passive ROM In Supine to Lt shoulder into flexion and abduction                          PT Long Term Goals - 01/12/21 1403       PT LONG TERM GOAL #1   Title Pt will have reduction in left upper arm at 10 cm proximal  to olecranon by 1 cm    Baseline 27 m on 01/12/2021    Time 4    Period Weeks    Status New      PT LONG TERM GOAL #2   Title Pt will report the pain in her left axilla is a 3/5 at rest    Baseline 01/12/2021 5/10    Time 4    Period Weeks    Status New      PT LONG TERM GOAL #3   Title Pt will be independent in a basic shoulder ROM exercise program    Time 4    Period Weeks    Status New                   Plan - 02/08/21 1203     Clinical Impression Statement Rechecked shoulder AROM today which really remains unchanged since starting therapy.  Pt does wall walking and AROM well at home but still has pain and cording in the posterior axilla and under the pacemaker.  Discussed LBP history with pt's most recent MRI stating severe DDD and foraminal narrowing at all levels with prior fusion.  Pt has the most trouble with pain and spasm in the low back upon sit to stand and with walking.  Pt has tried PT in the past including stretches and strengthening but she reports this increased the trouble.  Pt has an appt with the pain MD 03/20/21 so hopefully this will give her some more options.  Will probably discuss posture and pain relief techniques for stenosis but not do much in terms of TE as pt is trying these already.    PT Frequency 2x / week    PT Duration 4 weeks    PT Treatment/Interventions  ADLs/Self Care Home Management;DME Instruction;Therapeutic activities;Patient/family education;Orthotic Fit/Training;Manual techniques;Manual lymph drainage;Compression bandaging;Scar mobilization;Passive range of motion;Therapeutic exercise    PT Next Visit Plan discuss pelvic positioning and sstenosis; Manual work to tightness/cording at posterior shoulder /axilla with MLD to left upper arm. Cont AA/ROM;    Consulted and Agree with Plan of Care Patient             Patient will benefit from skilled therapeutic intervention in order to improve the following deficits and impairments:     Visit Diagnosis: Acute pain of left shoulder  Postmastectomy lymphedema  Aftercare following surgery for neoplasm  Stiffness of left shoulder joint     Problem List Patient Active Problem List   Diagnosis Date Noted   Dementia without behavioral disturbance ([place]) 02/04/2020   Urinary retention 02/04/2020   Seasonal allergies 02/04/2020   Recurrent major depressive disorder, in partial remission ([place]) 07/17/2019   Status post left mastectomy 07/01/2019   Breast cancer  of lower-outer quadrant of left female breast ([place]) 06/04/2019   Candida infection, oral 01/15/2019   Senile purpura ([place]) 04/14/2018   Lumbar post-laminectomy syndrome 12/03/2017   Lumbar spondylosis 12/03/2017   History of back surgery 09/01/2017   Constipation 09/01/2017   Hypothyroidism due to acquired atrophy of thyroid 09/01/2017   Mixed hyperlipidemia 09/01/2017   Age-related osteoporosis without current pathological fracture 09/01/2017   High risk medication use 09/01/2017   Arthritis of hand 05/17/2017   Atherosclerosis of native arteries of extremity with intermittent claudication ([place]) 05/15/2017   Status post total bilateral knee replacement 12/20/2016   Gait abnormality 07/16/2016   Chronic low back pain 07/16/2016   Mild cognitive impairment 07/16/2016   Wrist pain 05/10/2015   S/P left TKA 11/29/2014   S/P  knee replacement 11/29/2014   Spontaneous bruising 08/27/2014   Numbness and tingling in right hand 07/28/2014   Essential tremor 07/28/2014   Dizziness 05/28/2014   Shaky 05/28/2014   Memory loss 05/28/2014   Depression 05/06/2014   Lipoma of left upper thigh 3x5 cm 09/28/2011   Cerebral vascular accident ([place]) 08/09/2011   Syncope 08/09/2011   History of breast cancer T1bNxMx, s/p BCT 2008, triple negative 01/26/2011   Chronic L breast pain with chronic recurrent seroma, s/p excisional biopsy 01/12/2010 01/26/2011   Fainted    ICD (implantable cardioverter-defibrillator), biventricular, in situ 14/48/1856   Chronic systolic heart failure ([place]) 08/02/2010   Hypertension 08/02/2010    Stark Bray, PT 02/08/2021, 12:07 PM  [facility] @ [address] [place], [place], [address] Phone: [phone]   Fax:  [phone]  Name: Zane Samson MRN: [id number] Date of Birth: [birth year]

## 2021-02-10 NOTE — Progress Notes (Signed)
Remote ICD transmission.   

## 2021-02-14 ENCOUNTER — Ambulatory Visit: Payer: Medicare Other | Admitting: Rehabilitation

## 2021-02-14 ENCOUNTER — Encounter: Payer: Self-pay | Admitting: Rehabilitation

## 2021-02-14 ENCOUNTER — Other Ambulatory Visit: Payer: Self-pay

## 2021-02-14 DIAGNOSIS — M25512 Pain in left shoulder: Secondary | ICD-10-CM | POA: Diagnosis not present

## 2021-02-14 DIAGNOSIS — Z483 Aftercare following surgery for neoplasm: Secondary | ICD-10-CM

## 2021-02-14 DIAGNOSIS — I972 Postmastectomy lymphedema syndrome: Secondary | ICD-10-CM

## 2021-02-14 DIAGNOSIS — M25612 Stiffness of left shoulder, not elsewhere classified: Secondary | ICD-10-CM

## 2021-02-14 NOTE — Therapy (Signed)
[facility] @ [address] [place], [place], [address] Phone: [phone]   Fax:  [phone]  Physical Therapy Treatment  Patient Details  Name: [name] MRN: [id number] Date of Birth: 03-26-1934 Referring Provider (PT): Dr. Barry Dienes (Dr Barry Dienes is surgeon)   Encounter Date: 02/14/2021   PT End of Session - 02/14/21 1446     Visit Number 7    Number of Visits 10    Date for PT Re-Evaluation 02/28/21    PT Start Time 1400    PT Stop Time 2706    PT Time Calculation (min) 49 min             Past Medical History:  Diagnosis Date   Arthritis    Cancer ([place])    left breast cancer    Cataracts, bilateral    removed by surgery   CHF (congestive heart failure) ([place])    PACEMAKER & DEFIB   Complication of anesthesia    hypotensive after back surgery in 2006   Depression    Dyslipidemia    Fainted 04/21/06   AT [facility]   GERD (gastroesophageal reflux disease)    Headache(784.0)    Hearing loss    bilateral hearing aids   HLD (hyperlipidemia)    diet controlled    Hypertension    Hypothyroidism    ICD (implantable cardiac defibrillator) in place    pt has pacer/icd   ICD (implantable cardiac defibrillator), biventricular, in situ    LBBB (left bundle branch block)    Memory loss    Nonischemic cardiomyopathy ([place])    Normal coronary arteries    s/p cardiac cath 2007   Pacemaker    ICD [employer]   Syncope    Systolic CHF [facility])    Vertigo    Wears glasses     Past Surgical History:  Procedure Laterality Date   BACK SURGERY     lumbar fusion    BREAST LUMPECTOMY Left 2008   BREAST SURGERY  2000   LUMP REMOVAL. STAGE 1 CANCER   CARDIAC CATHETERIZATION     CATARACT EXTRACTION     COLONOSCOPY     EYE SURGERY     IMPLANTABLE CARDIOVERTER DEFIBRILLATOR GENERATOR CHANGE N/A 12/18/2012   Procedure: IMPLANTABLE CARDIOVERTER DEFIBRILLATOR GENERATOR CHANGE;  Surgeon: [name], MD;   [facility] CATH LAB;  Service: Cardiovascular;  Laterality: N/A;   JOINT REPLACEMENT  06/14/01   right   LUMBAR FUSION  2006   MASS EXCISION  11/08/2011   Procedure: EXCISION MASS;  Surgeon: [name], MD;  [facility];  Service: General;  Laterality: Left;  Excision Left Thigh Mass   MASTECTOMY W/ SENTINEL NODE BIOPSY Left 06/04/2019   Procedure: LEFT MASTECTOMY WITH SENTINEL LYMPH NODE BIOPSY;  Surgeon: [name], MD;  Location: [place];  Service: General;  Laterality: Left;   MASTECTOMY, PARTIAL  2008   GOT PACEMAKER AND DEFIB AT THAT TIME   PACEMAKER INSERTION  04/23/06   TOTAL KNEE ARTHROPLASTY  05/17/01   RIGHT KNEE   TOTAL KNEE ARTHROPLASTY Left 11/29/2014   Procedure: TOTAL LEFT KNEE ARTHROPLASTY;  Surgeon: Paralee Cancel, MD;  [facility];  Service: Orthopedics;  Laterality: Left;    There were no vitals filed for this visit.   Subjective Assessment - 02/14/21 1401     Subjective I think it has been helping.  I know what to do independently.  I feel like it helps but later  that night it goes right back to how it was.  So maybe the therapy doesn't help as much.    Pertinent History left breast cancer with left mastectomy on 06/04/2019 with 5 lymph nodes . past history includes lumpectomy in 2008 with radiation and no lymph nodes removed she has chronic left breast pain and seroma with excisional biopsy in 2011.  Hx includes CHR with implanted defibrillator in left chest, bilateral TKR, osteoporosis. Pt is currently having back pain    Currently in Pain? No/denies   only hurts if I push it up to 5/10   Pain Location Axilla    Pain Orientation Left    Pain Descriptors / Indicators Aching    Pain Type Chronic pain    Pain Onset More than a month ago    Pain Frequency Intermittent                [facility] PT Assessment - 02/14/21 0001       AROM   Right Shoulder Flexion 150 Degrees    Left Shoulder Flexion 132 Degrees   posterior shoulder pain   Left Shoulder  ABduction 125 Degrees   posterior axillary pain                          [facility] Adult PT Treatment/Exercise - 02/14/21 0001       Manual Therapy   Soft tissue mobilization in supine and Rt sidelying to Lt lateral trunk from incision to lateral border of scapula, then medial scapula border where tightness palpated but this improved today    Myofascial Release In Supine to Lt axilla at area of cording lateral to pacemaker during P/ROM; also to posterior arm down to elbow where cording palpable and pt reports feeling tighter today    Passive ROM In Supine to Lt shoulder into flexion and abduction                          PT Long Term Goals - 02/14/21 1410       PT LONG TERM GOAL #1   Title Pt will have reduction in left upper arm at 10 cm proximal  to olecranon by 1 cm    Status Deferred      PT LONG TERM GOAL #2   Title Pt will report the pain in her left axilla is a 3/5 at rest    Baseline 0-2/10 now on 02/14/21    Status Achieved      PT LONG TERM GOAL #3   Title Pt will be independent in a basic shoulder ROM exercise program    Status Achieved      PT LONG TERM GOAL #4   Title Patient will be Independent with HEP for shoulder ROM and strength    Status Achieved      PT LONG TERM GOAL #5   Title NA                   Plan - 02/14/21 1450     Clinical Impression Statement Extended POC dates to allow pt to finish the remaining visits.  Overall resting pain has decreased slightly but no significant change in AROM and pt is now ind with self stretches.  Pt is also very limited by back spasm.    PT Frequency 2x / week    PT Duration 2 weeks    PT Treatment/Interventions ADLs/Self Care Home Management;DME Instruction;Therapeutic  activities;Patient/family education;Orthotic Fit/Training;Manual techniques;Manual lymph drainage;Compression bandaging;Scar mobilization;Passive range of motion;Therapeutic exercise    PT Next Visit Plan Manual  work to tightness/cording at posterior shoulder /axilla with MLD to left upper arm. Cont AA/ROM;    Consulted and Agree with Plan of Care Patient             Patient will benefit from skilled therapeutic intervention in order to improve the following deficits and impairments:     Visit Diagnosis: Acute pain of left shoulder  Postmastectomy lymphedema  Aftercare following surgery for neoplasm  Stiffness of left shoulder joint     Problem List Patient Active Problem List   Diagnosis Date Noted   Dementia without behavioral disturbance ([place]) 02/04/2020   Urinary retention 02/04/2020   Seasonal allergies 02/04/2020   Recurrent major depressive disorder, in partial remission ([place]) 07/17/2019   Status post left mastectomy 07/01/2019   Breast cancer of lower-outer quadrant of left female breast ([place]) 06/04/2019   Candida infection, oral 01/15/2019   Senile purpura ([place]) 04/14/2018   Lumbar post-laminectomy syndrome 12/03/2017   Lumbar spondylosis 12/03/2017   History of back surgery 09/01/2017   Constipation 09/01/2017   Hypothyroidism due to acquired atrophy of thyroid 09/01/2017   Mixed hyperlipidemia 09/01/2017   Age-related osteoporosis without current pathological fracture 09/01/2017   High risk medication use 09/01/2017   Arthritis of hand 05/17/2017   Atherosclerosis of native arteries of extremity with intermittent claudication ([place]) 05/15/2017   Status post total bilateral knee replacement 12/20/2016   Gait abnormality 07/16/2016   Chronic low back pain 07/16/2016   Mild cognitive impairment 07/16/2016   Wrist pain 05/10/2015   S/P left TKA 11/29/2014   S/P knee replacement 11/29/2014   Spontaneous bruising 08/27/2014   Numbness and tingling in right hand 07/28/2014   Essential tremor 07/28/2014   Dizziness 05/28/2014   Shaky 05/28/2014   Memory loss 05/28/2014   Depression 05/06/2014   Lipoma of left upper thigh 3x5 cm 09/28/2011   Cerebral vascular  accident ([place]) 08/09/2011   Syncope 08/09/2011   History of breast cancer T1bNxMx, s/p BCT 2008, triple negative 01/26/2011   Chronic L breast pain with chronic recurrent seroma, s/p excisional biopsy 01/12/2010 01/26/2011   Fainted    ICD (implantable cardioverter-defibrillator), biventricular, in situ 53/66/4403   Chronic systolic heart failure ([place]) 08/02/2010   Hypertension 08/02/2010    Stark Bray, PT 02/14/2021, 2:54 PM  [facility] @ [place] [address] [place], [place], [address] Phone: [phone]   Fax:  [phone]  Name: Diala Waxman MRN: [id number] Date of Birth: [birth year]

## 2021-02-16 ENCOUNTER — Ambulatory Visit: Payer: Medicare Other | Admitting: Rehabilitation

## 2021-02-16 ENCOUNTER — Encounter: Payer: Self-pay | Admitting: Rehabilitation

## 2021-02-16 ENCOUNTER — Other Ambulatory Visit: Payer: Self-pay

## 2021-02-16 DIAGNOSIS — M25512 Pain in left shoulder: Secondary | ICD-10-CM | POA: Diagnosis not present

## 2021-02-16 DIAGNOSIS — I972 Postmastectomy lymphedema syndrome: Secondary | ICD-10-CM

## 2021-02-16 DIAGNOSIS — Z483 Aftercare following surgery for neoplasm: Secondary | ICD-10-CM

## 2021-02-16 NOTE — Therapy (Signed)
[place] @ [address] [place], [place], [address] Phone: [phone]   Fax:  [phone]  Physical Therapy Treatment  Patient Details  Name: [name] MRN: [id number] Date of Birth: 04/12/1933 Referring Provider (PT): Dr. Barry Dienes (Dr Barry Dienes is surgeon)   Encounter Date: 02/16/2021   PT End of Session - 02/16/21 1153     Visit Number 8    Date for PT Re-Evaluation 02/28/21    PT Start Time 40    PT Stop Time 1148    PT Time Calculation (min) 48 min    Activity Tolerance Patient tolerated treatment well;Patient limited by pain    Behavior During Therapy [facility] for tasks assessed/performed             Past Medical History:  Diagnosis Date   Arthritis    Cancer ([place])    left breast cancer    Cataracts, bilateral    removed by surgery   CHF (congestive heart failure) ([place])    PACEMAKER & DEFIB   Complication of anesthesia    hypotensive after back surgery in 2006   Depression    Dyslipidemia    Fainted 04/21/06   AT [facility]   GERD (gastroesophageal reflux disease)    Headache(784.0)    Hearing loss    bilateral hearing aids   HLD (hyperlipidemia)    diet controlled    Hypertension    Hypothyroidism    ICD (implantable cardiac defibrillator) in place    pt has pacer/icd   ICD (implantable cardiac defibrillator), biventricular, in situ    LBBB (left bundle branch block)    Memory loss    Nonischemic cardiomyopathy ([place])    Normal coronary arteries    s/p cardiac cath 2007   Pacemaker    ICD [employer]   Syncope    Systolic CHF [facility])    Vertigo    Wears glasses     Past Surgical History:  Procedure Laterality Date   BACK SURGERY     lumbar fusion    BREAST LUMPECTOMY Left 2008   BREAST SURGERY  2000   LUMP REMOVAL. STAGE 1 CANCER   CARDIAC CATHETERIZATION     CATARACT EXTRACTION     COLONOSCOPY     EYE SURGERY     IMPLANTABLE CARDIOVERTER DEFIBRILLATOR GENERATOR CHANGE N/A  12/18/2012   Procedure: IMPLANTABLE CARDIOVERTER DEFIBRILLATOR GENERATOR CHANGE;  Surgeon: [name], MD;  Location: [facility] CATH LAB;  Service: Cardiovascular;  Laterality: N/A;   JOINT REPLACEMENT  06/14/01   right   LUMBAR FUSION  2006   MASS EXCISION  11/08/2011   Procedure: EXCISION MASS;  Surgeon: [name], MD;  Location: [facility];  Service: General;  Laterality: Left;  Excision Left Thigh Mass   MASTECTOMY W/ SENTINEL NODE BIOPSY Left 06/04/2019   Procedure: LEFT MASTECTOMY WITH SENTINEL LYMPH NODE BIOPSY;  Surgeon: [name], MD;  Location: [place];  Service: General;  Laterality: Left;   MASTECTOMY, PARTIAL  2008   GOT PACEMAKER AND DEFIB AT THAT TIME   PACEMAKER INSERTION  04/23/06   TOTAL KNEE ARTHROPLASTY  05/17/01   RIGHT KNEE   TOTAL KNEE ARTHROPLASTY Left 11/29/2014   Procedure: TOTAL LEFT KNEE ARTHROPLASTY;  Surgeon: Paralee Cancel, MD;  Location: [facility];  Service: Orthopedics;  Laterality: Left;    There were no vitals filed for this visit.  [facility] Adult PT Treatment/Exercise - 02/16/21 0001       Shoulder Exercises: Pulleys   Flexion 2 minutes    ABduction 2 minutes      Manual Therapy   Soft tissue mobilization in supine and Rt sidelying to Lt lateral trunk from incision to lateral border of scapula, then medial scapula border where tightness palpated but this improved today    Myofascial Release In Supine to Lt axilla at area of cording lateral to pacemaker during P/ROM; also to posterior arm down to elbow where cording palpable and pt reports feeling tighter today    Passive ROM In Supine to Lt shoulder into flexion and abduction                          PT Long Term Goals - 02/14/21 1410       PT LONG TERM GOAL #1   Title Pt will have reduction in left upper arm at 10 cm proximal  to olecranon by 1 cm    Status Deferred      PT LONG TERM GOAL #2   Title Pt will report the pain in her left axilla is a 3/5 at  rest    Baseline 0-2/10 now on 02/14/21    Status Achieved      PT LONG TERM GOAL #3   Title Pt will be independent in a basic shoulder ROM exercise program    Status Achieved      PT LONG TERM GOAL #4   Title Patient will be Independent with HEP for shoulder ROM and strength    Status Achieved      PT LONG TERM GOAL #5   Title NA                   Plan - 02/16/21 1153     Clinical Impression Statement pt is starting to have worsening sciatica back pain now up to around 7/10 at rest without relief from anything.  Continued POC for Lt UE which pt tolerates well.    PT Frequency 2x / week    PT Duration 2 weeks    PT Next Visit Plan Manual work to tightness/cording at posterior shoulder /axilla with MLD to left upper arm. Cont AA/ROM;    Consulted and Agree with Plan of Care Patient             Patient will benefit from skilled therapeutic intervention in order to improve the following deficits and impairments:     Visit Diagnosis: Acute pain of left shoulder  Postmastectomy lymphedema  Aftercare following surgery for neoplasm     Problem List Patient Active Problem List   Diagnosis Date Noted   Dementia without behavioral disturbance ([place]) 02/04/2020   Urinary retention 02/04/2020   Seasonal allergies 02/04/2020   Recurrent major depressive disorder, in partial remission ([place]) 07/17/2019   Status post left mastectomy 07/01/2019   Breast cancer of lower-outer quadrant of left female breast ([place]) 06/04/2019   Candida infection, oral 01/15/2019   Senile purpura ([place]) 04/14/2018   Lumbar post-laminectomy syndrome 12/03/2017   Lumbar spondylosis 12/03/2017   History of back surgery 09/01/2017   Constipation 09/01/2017   Hypothyroidism due to acquired atrophy of thyroid 09/01/2017   Mixed hyperlipidemia 09/01/2017   Age-related osteoporosis without current pathological fracture 09/01/2017   High risk medication use 09/01/2017   Arthritis of hand  05/17/2017   Atherosclerosis of native arteries of extremity with intermittent claudication (  [place]) 05/15/2017   Status post total bilateral knee replacement 12/20/2016   Gait abnormality 07/16/2016   Chronic low back pain 07/16/2016   Mild cognitive impairment 07/16/2016   Wrist pain 05/10/2015   S/P left TKA 11/29/2014   S/P knee replacement 11/29/2014   Spontaneous bruising 08/27/2014   Numbness and tingling in right hand 07/28/2014   Essential tremor 07/28/2014   Dizziness 05/28/2014   Shaky 05/28/2014   Memory loss 05/28/2014   Depression 05/06/2014   Lipoma of left upper thigh 3x5 cm 09/28/2011   Cerebral vascular accident ([place]) 08/09/2011   Syncope 08/09/2011   History of breast cancer T1bNxMx, s/p BCT 2008, triple negative 01/26/2011   Chronic L breast pain with chronic recurrent seroma, s/p excisional biopsy 01/12/2010 01/26/2011   Fainted    ICD (implantable cardioverter-defibrillator), biventricular, in situ 32/05/3341   Chronic systolic heart failure ([place]) 08/02/2010   Hypertension 08/02/2010    Stark Bray, PT 02/16/2021, 11:55 AM  [place] @ [address] [place], [place], [address] Phone: [phone]   Fax:  [phone]  Name: [name] MRN: [id number] Date of Birth: 1933/10/08

## 2021-02-20 ENCOUNTER — Encounter: Payer: Self-pay | Admitting: Rehabilitation

## 2021-02-20 ENCOUNTER — Ambulatory Visit: Payer: Medicare Other | Admitting: Rehabilitation

## 2021-02-20 ENCOUNTER — Other Ambulatory Visit: Payer: Self-pay

## 2021-02-20 DIAGNOSIS — M25512 Pain in left shoulder: Secondary | ICD-10-CM

## 2021-02-20 DIAGNOSIS — Z483 Aftercare following surgery for neoplasm: Secondary | ICD-10-CM

## 2021-02-20 DIAGNOSIS — I972 Postmastectomy lymphedema syndrome: Secondary | ICD-10-CM

## 2021-02-20 DIAGNOSIS — M25612 Stiffness of left shoulder, not elsewhere classified: Secondary | ICD-10-CM

## 2021-02-20 NOTE — Therapy (Signed)
[place] @ [address], [place], [address] Phone: [phone]   Fax:  [phone]  Physical Therapy Treatment  Patient Details  Name: [name] MRN: [id number] Date of Birth: 02/20/1934 Referring Provider (PT): Dr. Barry Dienes (Dr Barry Dienes is surgeon)   Encounter Date: 02/20/2021   PT End of Session - 02/20/21 1200     Visit Number 9    Number of Visits 10    Date for PT Re-Evaluation 02/28/21    PT Start Time 1103    PT Stop Time 1157    PT Time Calculation (min) 54 min    Activity Tolerance Patient tolerated treatment well;Patient limited by pain    Behavior During Therapy [facility] for tasks assessed/performed             Past Medical History:  Diagnosis Date   Arthritis    Cancer ([place])    left breast cancer    Cataracts, bilateral    removed by surgery   CHF (congestive heart failure) ([place])    PACEMAKER & DEFIB   Complication of anesthesia    hypotensive after back surgery in 2006   Depression    Dyslipidemia    Fainted 04/21/06   AT [facility]   GERD (gastroesophageal reflux disease)    Headache(784.0)    Hearing loss    bilateral hearing aids   HLD (hyperlipidemia)    diet controlled    Hypertension    Hypothyroidism    ICD (implantable cardiac defibrillator) in place    pt has pacer/icd   ICD (implantable cardiac defibrillator), biventricular, in situ    LBBB (left bundle branch block)    Memory loss    Nonischemic cardiomyopathy ([place])    Normal coronary arteries    s/p cardiac cath 2007   Pacemaker    ICD [employer]   Syncope    Systolic CHF [facility])    Vertigo    Wears glasses     Past Surgical History:  Procedure Laterality Date   BACK SURGERY     lumbar fusion    BREAST LUMPECTOMY Left 2008   BREAST SURGERY  2000   LUMP REMOVAL. STAGE 1 CANCER   CARDIAC CATHETERIZATION     CATARACT EXTRACTION     COLONOSCOPY     EYE SURGERY     IMPLANTABLE CARDIOVERTER  DEFIBRILLATOR GENERATOR CHANGE N/A 12/18/2012   Procedure: IMPLANTABLE CARDIOVERTER DEFIBRILLATOR GENERATOR CHANGE;  Surgeon: [name], MD;  Location: [facility] CATH LAB;  Service: Cardiovascular;  Laterality: N/A;   JOINT REPLACEMENT  06/14/01   right   LUMBAR FUSION  2006   MASS EXCISION  11/08/2011   Procedure: EXCISION MASS;  Surgeon: [name], MD;  [facility];  Service: General;  Laterality: Left;  Excision Left Thigh Mass   MASTECTOMY W/ SENTINEL NODE BIOPSY Left 06/04/2019   Procedure: LEFT MASTECTOMY WITH SENTINEL LYMPH NODE BIOPSY;  Surgeon: [name], MD;  Location: [place];  Service: General;  Laterality: Left;   MASTECTOMY, PARTIAL  2008   GOT PACEMAKER AND DEFIB AT THAT TIME   PACEMAKER INSERTION  04/23/06   TOTAL KNEE ARTHROPLASTY  05/17/01   RIGHT KNEE   TOTAL KNEE ARTHROPLASTY Left 11/29/2014   Procedure: TOTAL LEFT KNEE ARTHROPLASTY;  Surgeon: Paralee Cancel, MD;  [facility];  Service: Orthopedics;  Laterality: Left;    There were no vitals filed for this visit.   Subjective Assessment - 02/20/21 1101  Subjective My back is just so bad    Pertinent History left breast cancer with left mastectomy on 06/04/2019 with 5 lymph nodes . past history includes lumpectomy in 2008 with radiation and no lymph nodes removed she has chronic left breast pain and seroma with excisional biopsy in 2011.  Hx includes CHR with implanted defibrillator in left chest, bilateral TKR, osteoporosis. Pt is currently having back pain    Diagnostic tests Prior posterior decompression at L3 and L4 with posterior and  interbody fusion at L4-L5. No evidence of hardware complication. No  acute lumbar spine fracture. Multilevel degenerative disc disease and facet arthritis above and  below the fusion, with varying degrees of mild-moderate spinal canal  and moderate-severe neural foraminal narrowing as described above.    Currently in Pain? Yes    Pain Score 7     Pain Location Back    Pain  Orientation Left    Pain Descriptors / Indicators Aching    Pain Type Chronic pain    Pain Onset More than a month ago    Pain Frequency Constant                   LYMPHEDEMA/ONCOLOGY QUESTIONNAIRE - 02/20/21 0001       Left Upper Extremity Lymphedema   10 cm Proximal to Olecranon Process 26 cm    Olecranon Process 22 cm                        [facility] Adult PT Treatment/Exercise - 02/20/21 0001       Manual Therapy   Manual Therapy Edema management    Edema Management gave pt new tg soft.  Pt had discussed how it is hard to get her sleeve on.  PT demonstrated how to donn a juzo soft sleeve as this is what I thought she had and pt  asked how could she get a real sleeve like that.  Will show pt how to get a traditional sleeve at next appt.    Soft tissue mobilization in supine and Rt sidelying to Lt lateral trunk from incision to lateral border of scapula, then medial scapula border    Myofascial Release In Supine to Lt axilla at area of cording lateral to pacemaker during P/ROM; also to posterior arm down to elbow where cording palpable and pt reports feeling tighter today    Manual Lymphatic Drainage (MLD) post STM: left lower ribcage moving laterally and upper left arm                          PT Long Term Goals - 02/14/21 1410       PT LONG TERM GOAL #1   Title Pt will have reduction in left upper arm at 10 cm proximal  to olecranon by 1 cm    Status Deferred      PT LONG TERM GOAL #2   Title Pt will report the pain in her left axilla is a 3/5 at rest    Baseline 0-2/10 now on 02/14/21    Status Achieved      PT LONG TERM GOAL #3   Title Pt will be independent in a basic shoulder ROM exercise program    Status Achieved      PT LONG TERM GOAL #4   Title Patient will be Independent with HEP for shoulder ROM and strength    Status Achieved  PT LONG TERM GOAL #5   Title NA                   Plan - 02/20/21 1201      Clinical Impression Statement Pt has one more visit.  Would like to get measured and learn how to order a traditional arm sleeve similar to [employer] soft as pt only has a farrow wrap.  Pt is doing well except for back pain with cording evident but not limiting function.    PT Duration 2 weeks    PT Treatment/Interventions ADLs/Self Care Home Management;DME Instruction;Therapeutic activities;Patient/family education;Orthotic Fit/Training;Manual techniques;Manual lymph drainage;Compression bandaging;Scar mobilization;Passive range of motion;Therapeutic exercise    PT Next Visit Plan measure pt and show how to order sleeve similar to [place] .  Manual work to tightness/cording at posterior shoulder /axilla with MLD to left upper arm. Cont AA/ROM;    Consulted and Agree with Plan of Care Patient             Patient will benefit from skilled therapeutic intervention in order to improve the following deficits and impairments:     Visit Diagnosis: Acute pain of left shoulder  Postmastectomy lymphedema  Aftercare following surgery for neoplasm  Stiffness of left shoulder joint     Problem List Patient Active Problem List   Diagnosis Date Noted   Dementia without behavioral disturbance ([place]) 02/04/2020   Urinary retention 02/04/2020   Seasonal allergies 02/04/2020   Recurrent major depressive disorder, in partial remission ([place]) 07/17/2019   Status post left mastectomy 07/01/2019   Breast cancer of lower-outer quadrant of left female breast ([place]) 06/04/2019   Candida infection, oral 01/15/2019   Senile purpura ([place]) 04/14/2018   Lumbar post-laminectomy syndrome 12/03/2017   Lumbar spondylosis 12/03/2017   History of back surgery 09/01/2017   Constipation 09/01/2017   Hypothyroidism due to acquired atrophy of thyroid 09/01/2017   Mixed hyperlipidemia 09/01/2017   Age-related osteoporosis without current pathological fracture 09/01/2017   High risk medication use 09/01/2017    Arthritis of hand 05/17/2017   Atherosclerosis of native arteries of extremity with intermittent claudication ([place]) 05/15/2017   Status post total bilateral knee replacement 12/20/2016   Gait abnormality 07/16/2016   Chronic low back pain 07/16/2016   Mild cognitive impairment 07/16/2016   Wrist pain 05/10/2015   S/P left TKA 11/29/2014   S/P knee replacement 11/29/2014   Spontaneous bruising 08/27/2014   Numbness and tingling in right hand 07/28/2014   Essential tremor 07/28/2014   Dizziness 05/28/2014   Shaky 05/28/2014   Memory loss 05/28/2014   Depression 05/06/2014   Lipoma of left upper thigh 3x5 cm 09/28/2011   Cerebral vascular accident ([place]) 08/09/2011   Syncope 08/09/2011   History of breast cancer T1bNxMx, s/p BCT 2008, triple negative 01/26/2011   Chronic L breast pain with chronic recurrent seroma, s/p excisional biopsy 01/12/2010 01/26/2011   Fainted    ICD (implantable cardioverter-defibrillator), biventricular, in situ 38/75/6433   Chronic systolic heart failure ([place]) 08/02/2010   Hypertension 08/02/2010    Stark Bray, PT 02/20/2021, 12:03 PM  [place] @ [address] [place], [place], [address] Phone: [phone]   Fax:  [phone]  Name: [name] MRN: [id number] Date of Birth: 02/17/1934

## 2021-02-22 ENCOUNTER — Ambulatory Visit: Payer: Medicare Other

## 2021-02-22 ENCOUNTER — Other Ambulatory Visit: Payer: Self-pay

## 2021-02-22 ENCOUNTER — Telehealth: Payer: Self-pay | Admitting: *Deleted

## 2021-02-22 DIAGNOSIS — I972 Postmastectomy lymphedema syndrome: Secondary | ICD-10-CM

## 2021-02-22 DIAGNOSIS — M25512 Pain in left shoulder: Secondary | ICD-10-CM

## 2021-02-22 DIAGNOSIS — M25612 Stiffness of left shoulder, not elsewhere classified: Secondary | ICD-10-CM

## 2021-02-22 DIAGNOSIS — Z483 Aftercare following surgery for neoplasm: Secondary | ICD-10-CM

## 2021-02-22 NOTE — Telephone Encounter (Signed)
Patient called and left message on clinical intake and wanted to know if there is something else she can take other that the Robaxin for Muscle Spasms. Stated that she is not getting relief with the Robaxin.   Also stated that her Sciatic Nerve pain is bad. I offered patient an appointment and she Stated that she has already been seen for this.  Stated that she is also doing physical therapy   Please Advise. (Informed patient that Janett Billow was not in office but she wants this sent to her because she is her Care Provider)

## 2021-02-22 NOTE — Therapy (Signed)
[facility] @ [address] [place], [place], [address] Phone: [phone]   Fax:  [phone]  Physical Therapy Treatment  Patient Details  Name: [name] MRN: [id number] Date of Birth: 08-Jan-1934 Referring Provider (PT): Dr. Barry Dienes (Dr Barry Dienes is surgeon)   Encounter Date: 02/22/2021   PT End of Session - 02/22/21 1202     Visit Number 10    Number of Visits 10    Date for PT Re-Evaluation 02/28/21    PT Start Time 1107    PT Stop Time 6237    PT Time Calculation (min) 54 min    Activity Tolerance Patient tolerated treatment well    Behavior During Therapy [facility] for tasks assessed/performed             Past Medical History:  Diagnosis Date   Arthritis    Cancer ([place])    left breast cancer    Cataracts, bilateral    removed by surgery   CHF (congestive heart failure) ([place])    PACEMAKER & DEFIB   Complication of anesthesia    hypotensive after back surgery in 2006   Depression    Dyslipidemia    Fainted 04/21/06   AT [facility]   GERD (gastroesophageal reflux disease)    Headache(784.0)    Hearing loss    bilateral hearing aids   HLD (hyperlipidemia)    diet controlled    Hypertension    Hypothyroidism    ICD (implantable cardiac defibrillator) in place    pt has pacer/icd   ICD (implantable cardiac defibrillator), biventricular, in situ    LBBB (left bundle branch block)    Memory loss    Nonischemic cardiomyopathy ([place])    Normal coronary arteries    s/p cardiac cath 2007   Pacemaker    ICD [employer]   Syncope    Systolic CHF [facility])    Vertigo    Wears glasses     Past Surgical History:  Procedure Laterality Date   BACK SURGERY     lumbar fusion    BREAST LUMPECTOMY Left 2008   BREAST SURGERY  2000   LUMP REMOVAL. STAGE 1 CANCER   CARDIAC CATHETERIZATION     CATARACT EXTRACTION     COLONOSCOPY     EYE SURGERY     IMPLANTABLE CARDIOVERTER DEFIBRILLATOR GENERATOR CHANGE N/A  12/18/2012   Procedure: IMPLANTABLE CARDIOVERTER DEFIBRILLATOR GENERATOR CHANGE;  Surgeon: [name], MD;  Location: [facility] CATH LAB;  Service: Cardiovascular;  Laterality: N/A;   JOINT REPLACEMENT  06/14/01   right   LUMBAR FUSION  2006   MASS EXCISION  11/08/2011   Procedure: EXCISION MASS;  Surgeon: [name], MD;  Location: [facility];  Service: General;  Laterality: Left;  Excision Left Thigh Mass   MASTECTOMY W/ SENTINEL NODE BIOPSY Left 06/04/2019   Procedure: LEFT MASTECTOMY WITH SENTINEL LYMPH NODE BIOPSY;  Surgeon: [name], MD;  Location: [facility];  Service: General;  Laterality: Left;   MASTECTOMY, PARTIAL  2008   GOT PACEMAKER AND DEFIB AT THAT TIME   PACEMAKER INSERTION  04/23/06   TOTAL KNEE ARTHROPLASTY  05/17/01   RIGHT KNEE   TOTAL KNEE ARTHROPLASTY Left 11/29/2014   Procedure: TOTAL LEFT KNEE ARTHROPLASTY;  Surgeon: Paralee Cancel, MD;  Location: [facility];  Service: Orthopedics;  Laterality: Left;    There were no vitals filed for this visit.   Subjective Assessment - 02/22/21 1120     Subjective My back  is still hurting so bad. My Lt shoulder does feel a bit better and moves a little better. I've been doing my pulleys and can lift my arm higher when I do them now.    Pertinent History left breast cancer with left mastectomy on 06/04/2019 with 5 lymph nodes . past history includes lumpectomy in 2008 with radiation and no lymph nodes removed she has chronic left breast pain and seroma with excisional biopsy in 2011.  Hx includes CHR with implanted defibrillator in left chest, bilateral TKR, osteoporosis. Pt is currently having back pain    Patient Stated Goals to get rid to the pain and tightness in her left axilla    Currently in Pain? Yes    Pain Score 7    6-7/10   Pain Location Back   sciatic   Pain Orientation Right;Left    Pain Descriptors / Indicators Sharp   feels like I'm going to go down   Pain Type Chronic pain    Pain Radiating Towards down into both legs but Rt>Lt     Pain Onset More than a month ago    Pain Frequency Constant    Aggravating Factors  worse in morning    Pain Relieving Factors moving around                [facility] PT Assessment - 02/22/21 0001       AROM   Left Shoulder Flexion 132 Degrees   just a pull today   Left Shoulder ABduction 134 Degrees   just feels tender                          [facility] Adult PT Treatment/Exercise - 02/22/21 0001       Modalities   Modalities Moist Heat      Moist Heat Therapy   Number Minutes Moist Heat 25 Minutes   padded with extra towel   Moist Heat Location Lumbar Spine      Manual Therapy   Edema Management Measured pt during session and she fits into a Jobst Bella Lite arm sleeve size small, regular length, 15-20 mmHg. She was issued a handout and instructed in how to order this.    Soft tissue mobilization in supine Lt lateral trunk from incision to lateral border of scapula    Myofascial Release In Supine to Lt axilla at area of cording lateral to pacemaker during P/ROM; also to posterior arm down to elbow where cording palpable and pt reports feeling tighter today                          PT Long Term Goals - 02/14/21 1410       PT LONG TERM GOAL #1   Title Pt will have reduction in left upper arm at 10 cm proximal  to olecranon by 1 cm    Status Deferred      PT LONG TERM GOAL #2   Title Pt will report the pain in her left axilla is a 3/5 at rest    Baseline 0-2/10 now on 02/14/21    Status Achieved      PT LONG TERM GOAL #3   Title Pt will be independent in a basic shoulder ROM exercise program    Status Achieved      PT LONG TERM GOAL #4   Title Patient will be Independent with HEP for shoulder ROM and strength  Status Achieved      PT LONG TERM GOAL #5   Title NA                   Plan - 02/22/21 1209     Clinical Impression Statement Pt has progressed well with physical therapy as far as is independent with HEP  stretches, use of pulleys and feels she isn't quite as tight in axilla and along Lt lateral trunk as she was when she started with [place]. Her abduction has improved some as well. Pt was also measured for a compression sleeve, see flowheet, and daughter was instructed how to order and don. They know they can call us if they have further questions. Pt is D/C at this time.    Personal Factors and Comorbidities Age    Comorbidities previous triple negataive left breast cancer, seroma after lumpectomy, CHF with defibrillator, Bilateral TKR osteoporosis    Examination-Activity Limitations Reach Overhead;Bathing    Stability/Clinical Decision Making Stable/Uncomplicated    Rehab Potential Good    PT Frequency 2x / week    PT Duration 2 weeks    PT Treatment/Interventions ADLs/Self Care Home Management;DME Instruction;Therapeutic activities;Patient/family education;Orthotic Fit/Training;Manual techniques;Manual lymph drainage;Compression bandaging;Scar mobilization;Passive range of motion;Therapeutic exercise    PT Next Visit Plan D/C this visit.    PT Home Exercise Plan overhead single arm door flexion; Meeks Decopmression Exs    Consulted and Agree with Plan of Care Patient             Patient will benefit from skilled therapeutic intervention in order to improve the following deficits and impairments:  Decreased activity tolerance, Decreased knowledge of precautions, Decreased knowledge of use of DME, Decreased range of motion, Decreased strength, Increased fascial restricitons, Increased edema, Impaired perceived functional ability, Increased muscle spasms, Impaired UE functional use, Postural dysfunction, Pain  Visit Diagnosis: Acute pain of left shoulder  Postmastectomy lymphedema  Aftercare following surgery for neoplasm  Stiffness of left shoulder joint     Problem List Patient Active Problem List   Diagnosis Date Noted   Dementia without behavioral disturbance ([place]) 02/04/2020    Urinary retention 02/04/2020   Seasonal allergies 02/04/2020   Recurrent major depressive disorder, in partial remission ([place]) 07/17/2019   Status post left mastectomy 07/01/2019   Breast cancer of lower-outer quadrant of left female breast ([place]) 06/04/2019   Candida infection, oral 01/15/2019   Senile purpura ([place]) 04/14/2018   Lumbar post-laminectomy syndrome 12/03/2017   Lumbar spondylosis 12/03/2017   History of back surgery 09/01/2017   Constipation 09/01/2017   Hypothyroidism due to acquired atrophy of thyroid 09/01/2017   Mixed hyperlipidemia 09/01/2017   Age-related osteoporosis without current pathological fracture 09/01/2017   High risk medication use 09/01/2017   Arthritis of hand 05/17/2017   Atherosclerosis of native arteries of extremity with intermittent claudication ([place]) 05/15/2017   Status post total bilateral knee replacement 12/20/2016   Gait abnormality 07/16/2016   Chronic low back pain 07/16/2016   Mild cognitive impairment 07/16/2016   Wrist pain 05/10/2015   S/P left TKA 11/29/2014   S/P knee replacement 11/29/2014   Spontaneous bruising 08/27/2014   Numbness and tingling in right hand 07/28/2014   Essential tremor 07/28/2014   Dizziness 05/28/2014   Shaky 05/28/2014   Memory loss 05/28/2014   Depression 05/06/2014   Lipoma of left upper thigh 3x5 cm 09/28/2011   Cerebral vascular accident [facility]) 08/09/2011   Syncope 08/09/2011   History of breast cancer [id number], s/p BCT 2008, triple  negative 01/26/2011   Chronic L breast pain with chronic recurrent seroma, s/p excisional biopsy 01/12/2010 01/26/2011   Fainted    ICD (implantable cardioverter-defibrillator), biventricular, in situ 24/12/7351   Chronic systolic heart failure ([place]) 08/02/2010   Hypertension 08/02/2010    Otelia Limes, PTA 02/22/2021, 12:48 PM  [facility] @ [address] [place], [place], [address] Phone: [phone]    Fax:  [phone]  Name: [name] MRN: [id number] Date of Birth: November 16, 1933  PHYSICAL THERAPY DISCHARGE SUMMARY  Visits from Start of Care: 10    Current functional level related to goals / functional outcomes: See above   Remaining deficits: See above   Education / Equipment: See above Plan: Patient agrees to discharge.  Patient goals were not met. Patient is being discharged due to meeting the stated rehab goals.      Shan Levans, PT

## 2021-02-23 MED ORDER — PREGABALIN 25 MG PO CAPS
25.0000 mg | ORAL_CAPSULE | Freq: Two times a day (BID) | ORAL | 1 refills | Status: DC
Start: 2021-02-23 — End: 2021-05-02

## 2021-02-23 NOTE — Telephone Encounter (Signed)
Spoke with patient, discussed Jessica's response. Patient states her appointment with the pain clinic is pending for  December 2022 and yes she has seen the orthopedic specialist.  Patient in agreement to try lyrica in place of robaxin

## 2021-02-23 NOTE — Telephone Encounter (Signed)
It looks like she was referred to pain clinic- has she followed up with them? Or seen her orthopedic. Looks like they are following her. She could try low dose lyrica to help with the nerve pain if she is wiling. Do not see that she has allergy to that.   If robaxin is not helping would not think another muscle relaxer would be of benefit.

## 2021-03-07 ENCOUNTER — Telehealth: Payer: Self-pay | Admitting: Neurology

## 2021-03-07 NOTE — Telephone Encounter (Signed)
Pt called an informed It doesn't appear Dr. Delice Lesch is treating her pain.  She has chronic low back pain per Dr. Amparo Bristol records but Dr. Delice Lesch was seeing her for memory.  Have her f/u with PCP or whomever treats her pain. Pt will call her PCP to get an appointment

## 2021-03-07 NOTE — Telephone Encounter (Signed)
Pt said she needs to be seen sooner than jan. [name] told her if her symptoms get worse, to call her. Pt said pain was in her toes now

## 2021-03-07 NOTE — Telephone Encounter (Signed)
Pt stated that her pain is worse now, she stated that it has gone from her back down to her toes getting worse over the past 3 weeks. Pt informed that we can put her on a wait list and that dr Delice Lesch is out of the office for 2 weeks.

## 2021-03-20 ENCOUNTER — Other Ambulatory Visit: Payer: Self-pay

## 2021-03-20 ENCOUNTER — Encounter: Payer: Self-pay | Admitting: Physical Medicine and Rehabilitation

## 2021-03-20 ENCOUNTER — Encounter
Payer: Medicare Other | Attending: Physical Medicine and Rehabilitation | Admitting: Physical Medicine and Rehabilitation

## 2021-03-20 VITALS — BP 132/78 | HR 67 | Temp 97.8°F | Ht 59.0 in | Wt 113.0 lb

## 2021-03-20 DIAGNOSIS — M539 Dorsopathy, unspecified: Secondary | ICD-10-CM | POA: Insufficient documentation

## 2021-03-20 DIAGNOSIS — M961 Postlaminectomy syndrome, not elsewhere classified: Secondary | ICD-10-CM | POA: Diagnosis not present

## 2021-03-20 DIAGNOSIS — F3181 Bipolar II disorder: Secondary | ICD-10-CM | POA: Insufficient documentation

## 2021-03-20 DIAGNOSIS — G894 Chronic pain syndrome: Secondary | ICD-10-CM | POA: Insufficient documentation

## 2021-03-20 DIAGNOSIS — M5430 Sciatica, unspecified side: Secondary | ICD-10-CM | POA: Diagnosis not present

## 2021-03-20 DIAGNOSIS — R269 Unspecified abnormalities of gait and mobility: Secondary | ICD-10-CM | POA: Insufficient documentation

## 2021-03-20 NOTE — Progress Notes (Addendum)
Subjective:    Patient ID: [name], female    DOB: [birth year], 87 y.o.   MRN: [id number]  HPI Pt is an 87 yr old female with hx of breast CA and RUE lymphedema, as well as uterine prolapse, NICCM- as well as Chronic low back pain with B/L sciatica- also has memory issues on Aricept; hx of lumbar fusion at L4/5. Has a hx of Bipolar Type II depression. Is on Zoloft 200 mg daily, Trilpetal and Wellbutrin for mood.  Here for evaluation of chronic back pain with sciatica. .    Has had L L1/2 and R L2/3 lumbar epidural by [facility]- last done 02/10/21. However has RIGHT L1/2 moderate-severe NF narrowing.    Were helpful at first, but not this last time in November- At least 9 times has had injections- also the time prior to that only lasted 3 days or so.   Pain is 6/10- can get to 7/10 in early AM when wakes up.  Uses tylenol- 1000 mg 2x/day and then 650 mg at night.   Last Cr 0.85 and BUN 21. LFT's good/WNL. 24/21 in 9/22.   Pain radiates down from back into buttocks and down posterior legs to feet B/L  Burning and constant- doesn't let up.    Tried: Tried tramadol- made her dizzy and didn't help at all.  Lyrica- 25 mg BID- - just started 11/17-  fewer spasms with it.  Gabapentin- had dizziness and light headedness- didn't help much  Robaxin- hasn't been taking since also makes her sleepy- along with Lyrica-  Lidoderm- helps a few hours- then wears off- when still in place. Has been using patches 2x/day-  Never tried Duloxetine/Cymbalta.  Trileptal- on 300 mg daily- not at night.  Never tried Effexor in past.  Is on Wellbutrin- used to be on 450 mg daily- now on 150 mg daily.  Voltaren gel helps sometime,s but not others.    Has bipolar depression she's on Trileptal for-   Per daughter, more unsteady on RLE lately- 6 months or so- because has more pain on that side.   Pain Inventory Average Pain 6 Pain Right Now 6 My pain is constant and aching  In the last 24  hours, has pain interfered with the following? General activity 6 Relation with others 7 Enjoyment of life 2 What TIME of day is your pain at its worst? morning  Sleep (in general) Fair  Pain is worse with: bending Pain improves with: therapy/exercise Relief from Meds: 3  walk without assistance use a cane how many minutes can you walk? 10 ability to climb steps?  yes do you drive?  no  retired I need assistance with the following:  household duties and shopping  bladder control problems weakness trouble walking spasms dizziness depression anxiety  Any changes since last visit?  no  Any changes since last visit?  no    Family History  Problem Relation Age of Onset   Hypertension Mother    Arthritis Mother    Hypertension Father    Hypertension Brother    Hypertension Brother    Social History   Socioeconomic History   Marital status: Married    Spouse name: Not on file   Number of children: 1   Years of education: Masters   Highest education level: Not on file  Occupational History   Occupation: Retired  Tobacco Use   Smoking status: Never   Smokeless tobacco: Never  Vaping Use   Vaping Use:  Never used  Substance and Sexual Activity   Alcohol use: No   Drug use: No   Sexual activity: Not Currently    Birth control/protection: Post-menopausal  Other Topics Concern   Not on file  Social History Narrative   Lives at home with husband.   Right-handed.      As of 07/28/2014   Diet: No special diet   Caffeine: yes, Chocolate, tea and sodas    Married: YES, 1970   House: Yes, 2 stories, 2-3 persons live in home   Pets: No   Current/Past profession: Engineer, mining, Designer, jewellery    Exercise: Yes 2-3 x weekly   Living Will: Yes   DNR: No   POA/HPOA: No      Social Determinants of Radio broadcast assistant Strain: Not on file  Food Insecurity: Not on file  Transportation Needs: Not on file  Physical Activity: Not on file  Stress: Not  on file  Social Connections: Not on file   Past Surgical History:  Procedure Laterality Date   BACK SURGERY     lumbar fusion    BREAST LUMPECTOMY Left 2008   BREAST SURGERY  2000   LUMP REMOVAL. STAGE 1 CANCER   CARDIAC CATHETERIZATION     CATARACT EXTRACTION     COLONOSCOPY     EYE SURGERY     IMPLANTABLE CARDIOVERTER DEFIBRILLATOR GENERATOR CHANGE N/A 12/18/2012   Procedure: IMPLANTABLE CARDIOVERTER DEFIBRILLATOR GENERATOR CHANGE;  Surgeon: [name], MD;  Location: [facility] CATH LAB;  Service: Cardiovascular;  Laterality: N/A;   JOINT REPLACEMENT  06/14/01   right   LUMBAR FUSION  2006   MASS EXCISION  11/08/2011   Procedure: EXCISION MASS;  Surgeon: [name], MD;  [facility];  Service: General;  Laterality: Left;  Excision Left Thigh Mass   MASTECTOMY W/ SENTINEL NODE BIOPSY Left 06/04/2019   Procedure: LEFT MASTECTOMY WITH SENTINEL LYMPH NODE BIOPSY;  Surgeon: [name], MD;  Location: [facility];  Service: General;  Laterality: Left;   MASTECTOMY, PARTIAL  2008   GOT PACEMAKER AND DEFIB AT THAT TIME   PACEMAKER INSERTION  04/23/06   TOTAL KNEE ARTHROPLASTY  05/17/01   RIGHT KNEE   TOTAL KNEE ARTHROPLASTY Left 11/29/2014   Procedure: TOTAL LEFT KNEE ARTHROPLASTY;  Surgeon: Paralee Cancel, MD;  [facility];  Service: Orthopedics;  Laterality: Left;   Past Medical History:  Diagnosis Date   Arthritis    Cancer ([place])    left breast cancer    Cataracts, bilateral    removed by surgery   CHF (congestive heart failure) ([place])    PACEMAKER & DEFIB   Complication of anesthesia    hypotensive after back surgery in 2006   Depression    Dyslipidemia    Fainted 04/21/06   AT [facility]   GERD (gastroesophageal reflux disease)    Headache(784.0)    Hearing loss    bilateral hearing aids   HLD (hyperlipidemia)    diet controlled    Hypertension    Hypothyroidism    ICD (implantable cardiac defibrillator) in place    pt has pacer/icd   ICD (implantable cardiac  defibrillator), biventricular, in situ    LBBB (left bundle branch block)    Memory loss    Nonischemic cardiomyopathy ([place])    Normal coronary arteries    s/p cardiac cath 2007   Pacemaker    ICD [facility] [employer]   Syncope    Systolic CHF [facility])  Vertigo    Wears glasses    Ht 4\' 11"  (1.499 m)   Wt 113 lb (51.3 kg)   LMP  (LMP Unknown)   BMI 22.82 kg/m   Opioid Risk Score:   Fall Risk Score:  `1  Depression screen PHQ 2/9  Depression screen [facility] 2/9 03/20/2021 02/04/2020 11/06/2019 04/15/2019 11/24/2018 11/14/2018 03/31/2018  Decreased Interest 2 0 3 0 0 0 0  Down, Depressed, Hopeless 2 0 1 0 0 0 1  PHQ - 2 Score 4 0 4 0 0 0 1  Altered sleeping 2 - 3 - - - -  Tired, decreased energy 3 - 3 - - - -  Change in appetite 1 - 0 - - - -  Feeling bad or failure about yourself  1 - 0 - - - -  Trouble concentrating 1 - 1 - - - -  Moving slowly or fidgety/restless 1 - 0 - - - -  Suicidal thoughts 0 - 0 - - - -  PHQ-9 Score 13 - 11 - - - -  Difficult doing work/chores Somewhat difficult - - - - - -  Some recent data might be hidden     Review of Systems  Constitutional: Negative.   HENT: Negative.    Eyes: Negative.   Respiratory: Negative.    Cardiovascular: Negative.   Gastrointestinal: Negative.   Endocrine: Negative.   Genitourinary:  Positive for difficulty urinating and urgency.  Musculoskeletal:  Positive for back pain and gait problem.  Skin: Negative.   Allergic/Immunologic: Negative.   Neurological:  Positive for dizziness, weakness and numbness.  Hematological: Negative.   Psychiatric/Behavioral:  Positive for decreased concentration. The patient is nervous/anxious.       Objective:   Physical Exam Awake, alert, appropriate, frail; accompanied by daughter; using small 4 pronged cane to ambulate, NAD  MS:  5/5 in LE's- B/L - HF, KE, KF, DF and PF all 5/5 B/L  TTP over R paraspinals- ~ upper lumbar paraspinals at most Tender.  Flexion and extension both cause  increased back pain, but extension or even standing up well caused MORE pain.   Neuro: Intact to light touch in UE's B/L, however decreased at L4-S1 B/L- no difference between R and L side- L1-L3 intact B/L to light touch.  No clonus Trace DTRs at patella/achilles B/L   Gait: Almost fell backward getting off table/seat- needed assistance to catch her.  Shuffling gait- small steps- not antalgic. No specific weakness seen in gait.         Assessment & Plan:   Pt is an 87 yr old female with hx of breast CA and RUE lymphedema, as well as uterine prolapse, NICCM- as well as Chronic low back pain with B/L sciatica- also has memory issues on Aricept; hx of lumbar fusion at L4/5. Has a hx of Bipolar Type II depression. Is on Zoloft 200 mg daily, Trilpetal and Wellbutrin for mood.  Here for evaluation of chronic back pain with sciatica. .    Suggest moving your Trileptal to night time- to help sleepiness.   2. If psychiatry would be OK using Trileptal at higher dose, suggest increasing to 600 mg nightly- for nerve pain/back pain. Would need to check Sodium levels- to make sure sodium levels don't go low- check in 3-4 weeks after increasing Trileptal.  - prefer to go up on Trileptal-   3.  Will NOT use Cymbalta or Effexor since can trigger bipolar manic phase.   4. Tylenol #3-  allergic to codeine- N/V and dizziness.   5. Never tried any opiate that didn't make her have N/V- Even with tiny dose of Fentanyl- haven't ever tried Hydrocodone.    6. Use lidoderm patches - uses 1patch 2x/day- explained can only use up to 3 patches at a time. However can only use for 12 hours max in 24 hours/time.   7. Voltaren up to 4x/day- - don't use WITH lidocaine in same place-   8. Can use tylenol up to 2 grams/2000 day chronically and in the short term, can use up to 3 grams/3000 mg- so current dosage is OK.   9. If cannot get approval to increase the Trileptal, then our next choice, is to increase  Lyrica.   10. Has already had multiple injections without improvement  11. Will wait on Norco for now- and see if can get better results with nerve pain meds- will also think about low dose Naltrexone. Discussed in brief.   12. Also wait on Cymbalta- however will see if psychiatry feels comfortable replacing SOME of Zoloft dose with Cymbalta to help with nerve pain.   13. F/U in 3 months  14. Has hx of R shoulder pain since a fall- hasn't had any xrays but has had therapy- ask that PCP address this and we can try to address pain, but diagnosis of injury still needs to be made prior to that.   I spent a total of 52 minutes on total visit- as detailed above- specifically on options.

## 2021-03-20 NOTE — Patient Instructions (Signed)
Pt is an 87 yr old female with hx of breast CA and RUE lymphedema, as well as uterine prolapse, NICCM- as well as Chronic low back pain with B/L sciatica- also has memory issues on Aricept; hx of lumbar fusion at L4/5. Has a hx of Bipolar Type II depression. Is on Zoloft 200 mg daily, Trilpetal and Wellbutrin for mood.  Here for evaluation of chronic back pain with sciatica. .    Suggest moving your Trileptal to night time- to help sleepiness.   2. If psychiatry would be OK using Trileptal at higher dose, suggest increasing to 600 mg nightly- for nerve pain/back pain. Would need to check Sodium levels- to make sure sodium levels don't go low- check in 3-4 weeks after increasing Trileptal.  - prefer to go up on Trileptal-   3.  Will NOT use Cymbalta or Effexor since can trigger bipolar manic phase.   4. Tylenol #3- allergic to codeine- N/V and dizziness.   5. Never tried any opiate that didn't make her have N/V- Even with tiny dose of Fentanyl- haven't ever tried Hydrocodone.    6. Use lidoderm patches - uses 1patch 2x/day- explained can only use up to 3 patches at a time. However can only use for 12 hours max in 24 hours/time.   7. Voltaren up to 4x/day- - don't use WITH lidocaine in same place-   8. Can use tylenol up to 2 grams/2000 day chronically and in the short term, can use up to 3 grams/3000 mg- so current dosage is OK.   9. If cannot get approval to increase the Trileptal, then our next choice, is to increase Lyrica.   10. Has already had multiple injections without improvement  11. Will wait on Norco for now- and see if can get better results with nerve pain meds- will also think about low dose Naltrexone. Discussed in brief.   12. Also wait on Cymbalta- however will see if psychiatry feels comfortable replacing SOME of Zoloft dose with Cymbalta to help with nerve pain.   13. F/U in 3 months

## 2021-03-22 ENCOUNTER — Telehealth: Payer: Self-pay

## 2021-03-22 MED ORDER — OXCARBAZEPINE 600 MG PO TABS: 600.0000 mg | ORAL_TABLET | Freq: Every day | ORAL | 5 refills | Status: DC

## 2021-03-22 NOTE — Telephone Encounter (Signed)
Pt.notified

## 2021-03-22 NOTE — Telephone Encounter (Signed)
Patient called stating the Dr. Lin Landsman states ok to change Trileptal 600 mg but not at the same time with Cymbalta.

## 2021-03-28 LAB — HM MAMMOGRAPHY

## 2021-04-04 ENCOUNTER — Telehealth: Payer: Self-pay

## 2021-04-04 NOTE — Telephone Encounter (Signed)
[name] called to report the  Rx Trileptal only makes her sleepy. It is not decreasing her pain level. Patient reports a daily pain level of a 8 out of 10.  Call back phone [phone]. Per patient okay to leave a message.

## 2021-04-05 ENCOUNTER — Other Ambulatory Visit: Payer: Self-pay | Admitting: *Deleted

## 2021-04-05 MED ORDER — OMEPRAZOLE 40 MG PO CPDR: 40.0000 mg | DELAYED_RELEASE_CAPSULE | Freq: Every day | ORAL | 1 refills | Status: DC

## 2021-04-05 NOTE — Telephone Encounter (Signed)
Patient called and stated that she would like to go back to Omeprazole 40 mg One tablet daily.   Stated that the only reason she was taking Omeprazole 20mg  was because the pharmacy ran out of the 40mg  tablets.   Requesting Rx to be sent to [facility].

## 2021-04-07 ENCOUNTER — Telehealth: Payer: Self-pay

## 2021-04-07 NOTE — Telephone Encounter (Signed)
Patient has been advised

## 2021-04-07 NOTE — Telephone Encounter (Signed)
[name] called back today. Please advise.

## 2021-04-07 NOTE — Telephone Encounter (Signed)
It takes 2-3 weeks minimum to work- thanks- ML

## 2021-04-24 ENCOUNTER — Ambulatory Visit: Payer: Medicare Other | Admitting: Physical Medicine and Rehabilitation

## 2021-04-27 ENCOUNTER — Other Ambulatory Visit: Payer: Self-pay | Admitting: Nurse Practitioner

## 2021-05-01 ENCOUNTER — Other Ambulatory Visit: Payer: Self-pay

## 2021-05-01 ENCOUNTER — Ambulatory Visit
Admission: RE | Admit: 2021-05-01 | Discharge: 2021-05-01 | Disposition: A | Discharge status: Home / Self Care | Payer: Medicare Other | Source: Ambulatory Visit | Attending: Physician Assistant | Admitting: Physician Assistant

## 2021-05-01 DIAGNOSIS — N281 Cyst of kidney, acquired: Secondary | ICD-10-CM | POA: Diagnosis present

## 2021-05-02 ENCOUNTER — Other Ambulatory Visit: Payer: Self-pay | Admitting: Nurse Practitioner

## 2021-05-02 NOTE — Telephone Encounter (Signed)
Patient has request refill on medication "Lyrica". Patient last refill on medication dated 02/23/2022. Patient upcoming appointment dated 05/15/2021. Sign Contract added to patient appointment notes. Medication pend and sent to PCP Dewaine Oats Carlos American, NP for approval.

## 2021-05-04 ENCOUNTER — Ambulatory Visit (INDEPENDENT_AMBULATORY_CARE_PROVIDER_SITE_OTHER): Payer: Medicare Other

## 2021-05-04 DIAGNOSIS — I428 Other cardiomyopathies: Secondary | ICD-10-CM | POA: Diagnosis not present

## 2021-05-04 LAB — CUP PACEART REMOTE DEVICE CHECK
Battery Remaining Longevity: 24 mo
Battery Remaining Percentage: 42 %
Brady Statistic RA Percent Paced: 0 %
Brady Statistic RV Percent Paced: 100 %
Date Time Interrogation Session: 20230126084400
HighPow Impedance: 49 Ohm
Implantable Lead Implant Date: 20080116
Implantable Lead Implant Date: 20080116
Implantable Lead Implant Date: 20080116
Implantable Lead Location: 753859
Implantable Lead Location: 753860
Implantable Lead Location: 753860
Implantable Lead Model: 157
Implantable Lead Model: 4469
Implantable Lead Model: 4555
Implantable Lead Serial Number: 136532
Implantable Lead Serial Number: 161542
Implantable Lead Serial Number: 473495
Implantable Pulse Generator Implant Date: 20140911
Lead Channel Impedance Value: 411 Ohm
Lead Channel Impedance Value: 561 Ohm
Lead Channel Impedance Value: 914 Ohm
Lead Channel Pacing Threshold Amplitude: 0.5 V
Lead Channel Pacing Threshold Amplitude: 0.6 V
Lead Channel Pacing Threshold Amplitude: 0.6 V
Lead Channel Pacing Threshold Pulse Width: 0.4 ms
Lead Channel Pacing Threshold Pulse Width: 0.4 ms
Lead Channel Pacing Threshold Pulse Width: 0.8 ms
Lead Channel Setting Pacing Amplitude: 2 V
Lead Channel Setting Pacing Amplitude: 2 V
Lead Channel Setting Pacing Amplitude: 2.4 V
Lead Channel Setting Pacing Pulse Width: 0.4 ms
Lead Channel Setting Pacing Pulse Width: 0.8 ms
Lead Channel Setting Sensing Sensitivity: 0.6 mV
Lead Channel Setting Sensing Sensitivity: 1 mV
Pulse Gen Serial Number: 111765

## 2021-05-05 ENCOUNTER — Ambulatory Visit (INDEPENDENT_AMBULATORY_CARE_PROVIDER_SITE_OTHER): Payer: Medicare Other | Admitting: Neurology

## 2021-05-05 ENCOUNTER — Ambulatory Visit: Payer: Self-pay | Admitting: Physician Assistant

## 2021-05-05 ENCOUNTER — Other Ambulatory Visit: Payer: Self-pay

## 2021-05-05 ENCOUNTER — Encounter: Payer: Self-pay | Admitting: Neurology

## 2021-05-05 VITALS — BP 112/69 | HR 70 | Ht 59.0 in | Wt 121.6 lb

## 2021-05-05 DIAGNOSIS — F02A4 Dementia in other diseases classified elsewhere, mild, with anxiety: Secondary | ICD-10-CM

## 2021-05-05 DIAGNOSIS — G301 Alzheimer's disease with late onset: Secondary | ICD-10-CM

## 2021-05-05 NOTE — Progress Notes (Signed)
NEUROLOGY FOLLOW UP OFFICE NOTE  [name] [id number] 1933/09/19  HISTORY OF PRESENT ILLNESS: I had the pleasure of seeing [name] in follow-up in the neurology clinic on 05/05/2021.  The patient was last seen 6 months ago for mild dementia likely due to Alzheimer's disease. She is again accompanied by her daughter [name] who helps supplement the history today.  Records and images were personally reviewed where available.She has tried several dementia medications but did not tolerate them (Donepezil, Rivastigmine, Memantine). Since her last visit, [name] notes more lapses with word-finding difficulties, forgetting conversations. This is sporadic with good and bad days. She is more anxious. She has chronic pain and focuses on her pain all the time. She was prescribed oxcarbazepine by her psychiatrist, they report her pain specialist suggested increasing her oxcarbazepine at night but she reports difficulty waking up the next morning. She manages her own medications, she has a pillbox and looks at her list. She lives with [facility] who manages finances. She does not drive. She denies leaving the stove on. She is independent with dressing and bathing. She wears Depends for urinary incontinence. No falls.    History on Initial Assessment 10/13/2020: This is an 88 year old right-handed woman with a history of hypertension, hyperlipidemia, hypothyroidism, LBBB s/p ICD placement, breast cancer s/p mastectomy, chronic back pain, bipolar depression, anxiety, presenting for evaluation of memory loss. She had been following with [facility] for memory loss since 2014-07-16, records were reviewed. She started noticing mild short-term memory changes in 07/16/2002 after lumbar surgery. Over the years, memory changes have progressed, her daughter moved in with her in 16-Jul-2019 and she stopped driving. She had side effects on Donepezil, Rivastigmine, and Memantine.   She states her memory is "terrible." [name] reports  memory changes became more noticeable after her husband passed away in 07/16/18. She had left the stove on a couple of times and left keys outside the door. She would forget what she was going to say and forget conversations from the day prior. She would repeat herself at times. She manages her own medications, [name] checks behind her. Her husband was managing finances, [name] took over when he passed away. She stopped driving in Jan/Feb 5681 because she was having staggering gait. She is independent with dressing and bathing. Notes from [place] indicate she was tried on Donepezil in Jul 16, 2015, Memantine in 2016/07/15, Rivastigmine in 2017-07-15. She felt "like a mummy, could not think."   She denies any headaches, diplopia, dysarthria/dysphagia, neck pain, focal numbness/tingling/weakness, anosmia. She has chronic back pain and constipation. She has occasional dizziness. There are tremors in both hands affecting handwriting. Sleep is okay, she usually sleeps 8 hours at night. Her last fall was in January 2022 when she fell off the bed. She had worked with physical therapy and staggering gait has stabilized. Mood is "sometimes stable," she is more irritable with herself when she could not remember things. [name] notices she is more anxious when she has to leave. No hallucinations or paranoia. No family history of dementia, history of significant head injuries, or alcohol use.    Laboratory Data: Lab Results  Component Value Date   TSH 0.67 11/17/2020   Lab Results  Component Value Date   VITAMINB12 1,117 (H) 07/28/2014    I personally reviewed head CT without contrast done 07/2019 which did not show any acute changes. There was mild chronic microvascular disease and mild diffuse atrophy that have slightly progressed compared to 2014-07-16 imaging.  PAST MEDICAL HISTORY: Past Medical History:  Diagnosis Date   Arthritis    Cancer ([place])    left breast cancer    Cataracts, bilateral    removed by surgery   CHF (congestive  heart failure) ([place])    PACEMAKER & DEFIB   Complication of anesthesia    hypotensive after back surgery in 2006   Depression    Dyslipidemia    Fainted 04/21/06   AT [facility]   GERD (gastroesophageal reflux disease)    Headache(784.0)    Hearing loss    bilateral hearing aids   HLD (hyperlipidemia)    diet controlled    Hypertension    Hypothyroidism    ICD (implantable cardiac defibrillator) in place    pt has pacer/icd   ICD (implantable cardiac defibrillator), biventricular, in situ    LBBB (left bundle branch block)    Memory loss    Nonischemic cardiomyopathy ([place])    Normal coronary arteries    s/p cardiac cath 2007   Pacemaker    ICD [employer]   Syncope    Systolic CHF [facility])    Vertigo    Wears glasses     MEDICATIONS: Current Outpatient Medications on File Prior to Visit  Medication Sig Dispense Refill   acetaminophen (TYLENOL) 500 MG tablet Take 500 mg by mouth in the morning and at bedtime. 500 mg in the morning and afternoon     acetaminophen (TYLENOL) 650 MG CR tablet Take 650 mg by mouth at bedtime.     albuterol (VENTOLIN HFA) 108 (90 Base) MCG/ACT inhaler Inhale 2 puffs into the lungs every 6 (six) hours as needed for wheezing or shortness of breath. 8 g 6   amLODipine (NORVASC) 5 MG tablet Take 0.5 tablets (2.5 mg total) by mouth daily. 90 tablet 1   ascorbic acid (VITAMIN C) 500 MG tablet Take 500 mg by mouth daily.     buPROPion (WELLBUTRIN XL) 150 MG 24 hr tablet Take 1 tablet (150 mg total) by mouth daily.     carvedilol (COREG) 3.125 MG tablet TAKE 1 TABLET BY MOUTH  TWICE DAILY 180 tablet 3   Cholecalciferol (VITAMIN D3) 50 MCG (2000 UT) TABS Take 2,000 Units by mouth daily.      diclofenac Sodium (VOLTAREN) 1 % GEL Apply 4 g topically 4 (four) times daily. 100 g 2   fluticasone (FLONASE) 50 MCG/ACT nasal spray Place 2 sprays into both nostrils daily. 16 g 3   furosemide (LASIX) 20 MG tablet TAKE 1 TABLET BY MOUTH  DAILY 90 tablet 3    levothyroxine (SYNTHROID, LEVOTHROID) 100 MCG tablet Take 1 tablet (100 mcg total) by mouth daily before breakfast. 90 tablet 3   Lidocaine 4 % PTCH Apply 1 patch topically every 12 (twelve) hours. Apply to back     Magnesium 250 MG TABS Take 250 mg by mouth daily.     meclizine (ANTIVERT) 12.5 MG tablet Take 1 tablet (12.5 mg total) by mouth 3 (three) times daily as needed for dizziness. 30 tablet 1   methocarbamol (ROBAXIN) 500 MG tablet Take 1 tablet (500 mg total) by mouth every 8 (eight) hours as needed. (Patient not taking: Reported on 03/20/2021) 90 tablet 1   mirabegron ER (MYRBETRIQ) 50 MG TB24 tablet Take 1 tablet (50 mg total) by mouth daily. 30 tablet 3   NONFORMULARY OR COMPOUNDED ITEM Apply 120 Tubes topically daily. Diclofenac/Cyclobenzaprine/lamotrigine/lidocaine/prilocaine (10480) 2%/2%/6%/5%/1.25% cream QTY: 120 GM SIG: (NEURO) APPLY 1-2 PUMPS (1-2 GMS) TO AFFECTED  AREA (S) OR FOCAL POINTS 3 TO 4 TIMES DAILY. RUB IN FOR 2 MINUTES TO ACHIEVE MAX PENETRATION. [place] HANDS WELL. 1 each 2   Omega-3 Fatty Acids (FISH OIL PO) Take 1 capsule by mouth daily.     omeprazole (PRILOSEC) 40 MG capsule Take 1 capsule (40 mg total) by mouth daily. 90 capsule 1   oxcarbazepine (TRILEPTAL) 600 MG tablet Take 1 tablet (600 mg total) by mouth at bedtime. 30 tablet 5   polyethylene glycol powder (GLYCOLAX/MIRALAX) 17 GM/SCOOP powder Take 1 Container by mouth as needed.     potassium chloride (KLOR-CON) 10 MEQ tablet TAKE 1 TABLET BY MOUTH  DAILY 90 tablet 3   pregabalin (LYRICA) 25 MG capsule Take 1 capsule (25 mg total) by mouth 2 (two) times daily. 60 capsule 5   senna-docusate (SENOKOT S) 8.6-50 MG tablet Take 2 tablets by mouth daily. 60 tablet 5   sertraline (ZOLOFT) 100 MG tablet Take 200 mg by mouth daily.      spironolactone (ALDACTONE) 25 MG tablet TAKE 1 TABLET BY MOUTH IN  THE MORNING 90 tablet 2   Vitamin E 400 units TABS Take 400 Units by mouth daily.      No current  facility-administered medications on file prior to visit.    ALLERGIES: Allergies  Allergen Reactions   Iodine Shortness Of Breath    Iodine contrast, CHF , SOB   Shellfish Allergy Shortness Of Breath   Memantine     Malaise, fogginess/ couldn't think   Aspirin Nausea And Vomiting   Codeine Nausea And Vomiting    FAMILY HISTORY: Family History  Problem Relation Age of Onset   Hypertension Mother    Arthritis Mother    Hypertension Father    Hypertension Brother    Hypertension Brother     SOCIAL HISTORY: Social History   Socioeconomic History   Marital status: Married    Spouse name: Not on file   Number of children: 1   Years of education: Masters   Highest education level: Not on file  Occupational History   Occupation: Retired  Tobacco Use   Smoking status: Never   Smokeless tobacco: Never  Scientific laboratory technician Use: Never used  Substance and Sexual Activity   Alcohol use: No   Drug use: No   Sexual activity: Not Currently    Birth control/protection: Post-menopausal  Other Topics Concern   Not on file  Social History Narrative   Lives at home with husband.   Right-handed.      As of 07/28/2014   Diet: No special diet   Caffeine: yes, Chocolate, tea and sodas    Married: YES, 1970   House: Yes, 2 stories, 2-3 persons live in home   Pets: No   Current/Past profession: Engineer, mining, Designer, jewellery    Exercise: Yes 2-3 x weekly   Living Will: Yes   DNR: No   POA/HPOA: No      Social Determinants of Radio broadcast assistant Strain: Not on file  Food Insecurity: Not on file  Transportation Needs: Not on file  Physical Activity: Not on file  Stress: Not on file  Social Connections: Not on file  Intimate Partner Violence: Not on file     PHYSICAL EXAM: Vitals:   05/05/21 1600  BP: 112/69  Pulse: 70  SpO2: 94%   General: No acute distress Head:  Normocephalic/atraumatic Skin/Extremities: No rash, no edema Neurological Exam: alert  and oriented to person, place, and  time. No aphasia or dysarthria. Fund of knowledge is reduced.  Recent and remote memory are impaired, 1/3 delayed recall. Attention and concentration are reduced, 3/5 WORLD backwards. Able to name and repeat. Cranial nerves: Pupils equal, round. Extraocular movements intact with no nystagmus. Visual fields full.  No facial asymmetry.  Motor: Bulk and tone normal, muscle strength 5/5 throughout with no pronator drift.   Finger to nose testing intact.  Gait slow and cautious, no ataxia.   IMPRESSION: This is an 88 yo RH woman with a history of hypertension, hyperlipidemia, hypothyroidism, LBBB s/p ICD placement, breast cancer s/p mastectomy, chronic back pain, bipolar depression, anxiety, with mild dementia, likely due to Alzheimer's disease with behavioral disturbance (anxiety). Head CT without contrast done 07/2019 which did not show any acute changes, mild chronic microvascular disease and mild diffuse atrophy that have slightly progressed compared to 2016 imaging. MOCA 21/30 in 10/13/2020. She had side effects on different dementia medications, we have agreed to hold off on restarting them. We discussed continued follow-up with Pain Management and Psychiatry. Continue close supervision, she does not drive. We discussed the importance of control of vascular risk factors, physical exercise, brain stimulation exercises, and MIND diet for overall brain health. Follow-up with Memory Disorders PA Sharene Butters in 6 months, they know to call for any changes.   Thank you for allowing me to participate in her care.  Please do not hesitate to call for any questions or concerns.    Ellouise Newer, M.D.   CC: Sherrie Mustache, NP

## 2021-05-05 NOTE — Patient Instructions (Signed)
Good to see you. Continue follow-up with Pain Management. Follow-up in 6 months, call for any changes.   FALL PRECAUTIONS: Be cautious when walking. Scan the area for obstacles that may increase the risk of trips and falls. When getting up in the mornings, sit up at the edge of the bed for a few minutes before getting out of bed. Consider elevating the bed at the head end to avoid drop of blood pressure when getting up. Walk always in a well-lit room (use night lights in the walls). Avoid area rugs or power cords from appliances in the middle of the walkways. Use a walker or a cane if necessary and consider physical therapy for balance exercise. Get your eyesight checked regularly.  HOME SAFETY: Consider the safety of the kitchen when operating appliances like stoves, microwave oven, and blender. Consider having supervision and share cooking responsibilities until no longer able to participate in those. Accidents with firearms and other hazards in the house should be identified and addressed as well.  ABILITY TO BE LEFT ALONE: If patient is unable to contact 911 operator, consider using LifeLine, or when the need is there, arrange for someone to stay with patients. Smoking is a fire hazard, consider supervision or cessation. Risk of wandering should be assessed by caregiver and if detected at any point, supervision and safe proof recommendations should be instituted.  MEDICATION SUPERVISION: Inability to self-administer medication needs to be constantly addressed. Implement a mechanism to ensure safe administration of the medications.  RECOMMENDATIONS FOR ALL PATIENTS WITH MEMORY PROBLEMS: 1. Continue to exercise (Recommend 30 minutes of walking everyday, or 3 hours every week) 2. Increase social interactions - continue going to [place] and enjoy social gatherings with friends and family 3. Eat healthy, avoid fried foods and eat more fruits and vegetables 4. Maintain adequate blood pressure, blood  sugar, and blood cholesterol level. Reducing the risk of stroke and cardiovascular disease also helps promoting better memory. 5. Avoid stressful situations. Live a simple life and avoid aggravations. Organize your time and prepare for the next day in anticipation. 6. Sleep well, avoid any interruptions of sleep and avoid any distractions in the bedroom that may interfere with adequate sleep quality 7. Avoid sugar, avoid sweets as there is a strong link between excessive sugar intake, diabetes, and cognitive impairment We discussed the Mediterranean diet, which has been shown to help patients reduce the risk of progressive memory disorders and reduces cardiovascular risk. This includes eating fish, eat fruits and green leafy vegetables, nuts like almonds and hazelnuts, walnuts, and also use olive oil. Avoid fast foods and fried foods as much as possible. Avoid sweets and sugar as sugar use has been linked to worsening of memory function.  There is always a concern of gradual progression of memory problems. If this is the case, then we may need to adjust level of care according to patient needs. Support, both to the patient and caregiver, should then be put into place.      Mediterranean Diet  Why follow it? Research shows Those who follow the Mediterranean diet have a reduced risk of heart disease  The diet is associated with a reduced incidence of Parkinson's and Alzheimer's diseases People following the diet may have longer life expectancies and lower rates of chronic diseases  The Dietary Guidelines for Americans recommends the Mediterranean diet as an eating plan to promote health and prevent disease  What Is the Mediterranean Diet?  Healthy eating plan based on typical foods and recipes  of Mediterranean-style cooking The diet is primarily a plant based diet; these foods should make up a majority of meals   Starches - Plant based foods should make up a majority of meals - They are an  important sources of vitamins, minerals, energy, antioxidants, and fiber - Choose whole grains, foods high in fiber and minimally processed items  - Typical grain sources include wheat, oats, barley, corn, brown rice, bulgar, farro, millet, polenta, couscous  - Various types of beans include chickpeas, lentils, fava beans, black beans, white beans   Fruits  Veggies - Large quantities of antioxidant rich fruits & veggies; 6 or more servings  - Vegetables can be eaten raw or lightly drizzled with oil and cooked  - Vegetables common to the traditional Mediterranean Diet include: artichokes, arugula, beets, broccoli, brussel sprouts, cabbage, carrots, celery, collard greens, cucumbers, eggplant, kale, leeks, lemons, lettuce, mushrooms, okra, onions, peas, peppers, potatoes, pumpkin, radishes, rutabaga, shallots, spinach, sweet potatoes, turnips, zucchini - Fruits common to the Mediterranean Diet include: apples, apricots, avocados, cherries, clementines, dates, figs, grapefruits, grapes, melons, nectarines, oranges, peaches, pears, pomegranates, strawberries, tangerines  Fats - Replace butter and margarine with healthy oils, such as olive oil, canola oil, and tahini  - Limit nuts to no more than a handful a day  - Nuts include walnuts, almonds, pecans, pistachios, pine nuts  - Limit or avoid candied, honey roasted or heavily salted nuts - Olives are central to the [employer] - can be eaten whole or used in a variety of dishes   Meats Protein - Limiting red meat: no more than a few times a month - When eating red meat: choose lean cuts and keep the portion to the size of deck of cards - Eggs: approx. 0 to 4 times a week  - Fish and lean poultry: at least 2 a week  - Healthy protein sources include, chicken, [place], lean beef, lamb - Increase intake of seafood such as tuna, salmon, trout, mackerel, shrimp, scallops - Avoid or limit high fat processed meats such as sausage and bacon  Dairy -  Include moderate amounts of low fat dairy products  - Focus on healthy dairy such as fat free yogurt, skim milk, low or reduced fat cheese - Limit dairy products higher in fat such as whole or 2% milk, cheese, ice cream  Alcohol - Moderate amounts of red wine is ok  - No more than 5 oz daily for women (all ages) and men older than age 13  - No more than 10 oz of wine daily for men younger than 7  Other - Limit sweets and other desserts  - Use herbs and spices instead of salt to flavor foods  - Herbs and spices common to the traditional Mediterranean Diet include: basil, bay leaves, chives, cloves, cumin, fennel, garlic, lavender, marjoram, mint, oregano, parsley, pepper, rosemary, sage, savory, sumac, tarragon, thyme   Its not just a diet, its a lifestyle:  The Mediterranean diet includes lifestyle factors typical of those in the region  Foods, drinks and meals are best eaten with others and savored Daily physical activity is important for overall good health This could be strenuous exercise like running and aerobics This could also be more leisurely activities such as walking, housework, yard-work, or taking the stairs Moderation is the key; a balanced and healthy diet accommodates most foods and drinks Consider portion sizes and frequency of consumption of certain foods   Meal Ideas & Options:  Breakfast:  Whole wheat  toast or whole wheat English muffins with peanut butter & hard boiled egg Steel cut oats topped with apples & cinnamon and skim milk  Fresh fruit: banana, strawberries, melon, berries, peaches  Smoothies: strawberries, bananas, greek yogurt, peanut butter Low fat greek yogurt with blueberries and granola  Egg white omelet with spinach and mushrooms Breakfast couscous: whole wheat couscous, apricots, skim milk, cranberries  Sandwiches:  Hummus and grilled vegetables (peppers, zucchini, squash) on whole wheat bread   Grilled chicken on whole wheat pita with lettuce,  tomatoes, cucumbers or tzatziki  [place] salad on whole wheat bread: tuna salad made with greek yogurt, olives, red peppers, capers, green onions Garlic rosemary lamb pita: lamb sauted with garlic, rosemary, salt & pepper; add lettuce, cucumber, greek yogurt to pita - flavor with lemon juice and black pepper  Seafood:  Mediterranean grilled salmon, seasoned with garlic, basil, parsley, lemon juice and black pepper Shrimp, lemon, and spinach whole-grain pasta salad made with low fat greek yogurt  Seared scallops with lemon orzo  Seared tuna steaks seasoned salt, pepper, coriander topped with tomato mixture of olives, tomatoes, olive oil, minced garlic, parsley, green onions and cappers  Meats:  Herbed greek chicken salad with kalamata olives, cucumber, feta  Red bell peppers stuffed with spinach, bulgur, lean ground beef (or lentils) & topped with feta   Kebabs: skewers of chicken, tomatoes, onions, zucchini, squash  [place] burgers: made with red onions, mint, dill, lemon juice, feta cheese topped with roasted red peppers Vegetarian Cucumber salad: cucumbers, artichoke hearts, celery, red onion, feta cheese, tossed in olive oil & lemon juice  Hummus and whole grain pita points with a greek salad (lettuce, tomato, feta, olives, cucumbers, red onion) Lentil soup with celery, carrots made with vegetable broth, garlic, salt and pepper  Tabouli salad: parsley, bulgur, mint, scallions, cucumbers, tomato, radishes, lemon juice, olive oil, salt and pepper.

## 2021-05-09 NOTE — Progress Notes (Signed)
05/10/2021 9:53 AM   [name] 1933/04/11 [id number]  Referring provider: [name], NP [place],  [place] [address]  Chief Complaint  Patient presents with   Over Active Bladder   Urological history: 1. OAB -Contributing factors of age, vaginal atrophy and diuretic -PVR 101 mL -Managed with Myrbetriq 50 mg daily  2. Urge incontinence -Contributing factors of age, vaginal atrophy, diuretics, hypertension, heart disease, CVA, Alzheimer's, arthritis, lumbar spondylosis and depression -see # 1   3. Renal cysts -RUS 04/2021 - Stable bilateral simple renal cysts with a new subcentimeter cyst noted within the lower pole of the left kidney. Correlation with additional 1 year follow-up renal ultrasound is recommended to determine stability.  HPI: [name] is a 88 y.o. female who presents today for one year follow up with her daughter, [name].    She was last seen here by Sam on May 05, 2020.  At that visit, she was at goal with Myrbetriq 50 mg daily and she was having left flank pain.  A renal ultrasound performed on May 18, 2020 noted a new left upper pole renal lesion of 19 mm in size.  Patient could not undergo an IV contrast exam or MRI due to shellfish allergies and placement of a cardiac pacemaker.  Follow-up CT in March 2022, and the lesion was not readily visible.  CT performed in June 2022 for abdominal pain, the lesion was not readily visible.   Follow-up renal ultrasound performed this year on May 01, 2021 noted stable bilateral simple renal cysts with a new subcentimeter cyst noted in the lower pole of the left kidney for which a repeat renal ultrasound in 1 year is recommended to determine stability.  PVR 101 mL  She has been experiencing 8 or more daytime urinations, 3 or more nighttime urinations and urge to urinate is strong.  She has been experiencing urge incontinence 1-2 times daily.  She wears an absorbent  depends daily.  She does limit fluid intake in efforts not to urinate as often.  She does engage in toilet mapping.  She started to experiencing urinary leakage one month ago.  She has been trying to find a good fit with the pessary, but it is still causing issues.    Patient denies any modifying or aggravating factors.  Patient denies any gross hematuria, dysuria or suprapubic/flank pain.  Patient denies any fevers, chills, nausea or vomiting.      Her most bothersome symptom at this time is when she stands up she leaks urine.  PMH: Past Medical History:  Diagnosis Date   Arthritis    Cancer ([place])    left breast cancer    Cataracts, bilateral    removed by surgery   CHF (congestive heart failure) ([place])    PACEMAKER & DEFIB   Complication of anesthesia    hypotensive after back surgery in 2006   Depression    Dyslipidemia    Fainted 04/21/06   AT [facility]   GERD (gastroesophageal reflux disease)    Headache(784.0)    Hearing loss    bilateral hearing aids   HLD (hyperlipidemia)    diet controlled    Hypertension    Hypothyroidism    ICD (implantable cardiac defibrillator) in place    pt has pacer/icd   ICD (implantable cardiac defibrillator), biventricular, in situ    LBBB (left bundle branch block)    Memory loss    Nonischemic cardiomyopathy ([place])  Normal coronary arteries    s/p cardiac cath 2007   Pacemaker    ICD [employer]   Syncope    Systolic CHF [facility])    Vertigo    Wears glasses     Surgical History: Past Surgical History:  Procedure Laterality Date   BACK SURGERY     lumbar fusion    BREAST LUMPECTOMY Left 2008   BREAST SURGERY  2000   LUMP REMOVAL. STAGE 1 CANCER   CARDIAC CATHETERIZATION     CATARACT EXTRACTION     COLONOSCOPY     EYE SURGERY     IMPLANTABLE CARDIOVERTER DEFIBRILLATOR GENERATOR CHANGE N/A 12/18/2012   Procedure: IMPLANTABLE CARDIOVERTER DEFIBRILLATOR GENERATOR CHANGE;  Surgeon: [name], MD;  Location: [facility] C[facility] LAB;   Service: Cardiovascular;  Laterality: N/A;   JOINT REPLACEMENT  06/14/01   right   LUMBAR FUSION  2006   MASS EXCISION  11/08/2011   Procedure: EXCISION MASS;  Surgeon: [name], MD;  Location: [facility];  Service: General;  Laterality: Left;  Excision Left Thigh Mass   MASTECTOMY W/ SENTINEL NODE BIOPSY Left 06/04/2019   Procedure: LEFT MASTECTOMY WITH SENTINEL LYMPH NODE BIOPSY;  Surgeon: [name], MD;  Location: [place];  Service: General;  Laterality: Left;   MASTECTOMY, PARTIAL  2008   GOT PACEMAKER AND DEFIB AT THAT TIME   PACEMAKER INSERTION  04/23/06   TOTAL KNEE ARTHROPLASTY  05/17/01   RIGHT KNEE   TOTAL KNEE ARTHROPLASTY Left 11/29/2014   Procedure: TOTAL LEFT KNEE ARTHROPLASTY;  Surgeon: Paralee Cancel, MD;  Location: [facility];  Service: Orthopedics;  Laterality: Left;    Home Medications:  Allergies as of 05/10/2021       Reactions   Iodine Shortness Of Breath   Iodine contrast, CHF , SOB   Shellfish Allergy Shortness Of Breath   Memantine    Malaise, fogginess/ couldn't think   Aspirin Nausea And Vomiting   Codeine Nausea And Vomiting        Medication List        Accurate as of May 10, 2021  9:53 AM. If you have any questions, ask your nurse or doctor.          STOP taking these medications    mirabegron ER 50 MG Tb24 tablet Commonly known as: Myrbetriq Stopped by: Zara Council, PA-C       TAKE these medications    acetaminophen 650 MG CR tablet Commonly known as: TYLENOL Take 650 mg by mouth at bedtime.   acetaminophen 500 MG tablet Commonly known as: TYLENOL Take 500 mg by mouth in the morning and at bedtime. 500 mg in the morning and afternoon   albuterol 108 (90 Base) MCG/ACT inhaler Commonly known as: VENTOLIN HFA Inhale 2 puffs into the lungs every 6 (six) hours as needed for wheezing or shortness of breath.   amLODipine 5 MG tablet Commonly known as: NORVASC Take 0.5 tablets (2.5 mg total) by mouth daily.   ascorbic acid 500 MG  tablet Commonly known as: VITAMIN C Take 500 mg by mouth daily.   buprenorphine 5 MCG/HR Ptwk Commonly known as: BUTRANS Place 1 patch onto the skin once a week.   buPROPion 150 MG 24 hr tablet Commonly known as: WELLBUTRIN XL Take 1 tablet (150 mg total) by mouth daily.   carvedilol 3.125 MG tablet Commonly known as: COREG TAKE 1 TABLET BY MOUTH  TWICE DAILY   diclofenac Sodium 1 % Gel Commonly known as: VOLTAREN Apply  4 g topically 4 (four) times daily.   FISH OIL PO Take 1 capsule by mouth daily.   fluticasone 50 MCG/ACT nasal spray Commonly known as: FLONASE Place 2 sprays into both nostrils daily.   furosemide 20 MG tablet Commonly known as: LASIX TAKE 1 TABLET BY MOUTH  DAILY   Gemtesa 75 MG Tabs Generic drug: Vibegron Take 75 mg by mouth daily. Started by: Zara Council, PA-C   levothyroxine 100 MCG tablet Commonly known as: SYNTHROID Take 1 tablet (100 mcg total) by mouth daily before breakfast.   Lidocaine 4 % Ptch Apply 1 patch topically every 12 (twelve) hours. Apply to back   Magnesium 250 MG Tabs Take 250 mg by mouth daily.   meclizine 12.5 MG tablet Commonly known as: ANTIVERT Take 1 tablet (12.5 mg total) by mouth 3 (three) times daily as needed for dizziness.   NONFORMULARY OR COMPOUNDED ITEM Apply 120 Tubes topically daily. Diclofenac/Cyclobenzaprine/lamotrigine/lidocaine/prilocaine (10480) 2%/2%/6%/5%/1.25% cream QTY: 120 GM SIG: (NEURO) APPLY 1-2 PUMPS (1-2 GMS) TO AFFECTED AREA (S) OR FOCAL POINTS 3 TO 4 TIMES DAILY. RUB IN FOR 2 MINUTES TO ACHIEVE MAX PENETRATION. [place] HANDS WELL.   omeprazole 40 MG capsule Commonly known as: PRILOSEC Take 1 capsule (40 mg total) by mouth daily.   oxcarbazepine 600 MG tablet Commonly known as: Trileptal Take 1 tablet (600 mg total) by mouth at bedtime.   polyethylene glycol powder 17 GM/SCOOP powder Commonly known as: GLYCOLAX/MIRALAX Take 1 Container by mouth as needed.   potassium chloride 10  MEQ tablet Commonly known as: KLOR-CON M TAKE 1 TABLET BY MOUTH  DAILY   pregabalin 25 MG capsule Commonly known as: LYRICA Take 1 capsule (25 mg total) by mouth 2 (two) times daily.   senna-docusate 8.6-50 MG tablet Commonly known as: Senokot S Take 2 tablets by mouth daily.   sertraline 100 MG tablet Commonly known as: ZOLOFT Take 200 mg by mouth daily.   spironolactone 25 MG tablet Commonly known as: ALDACTONE TAKE 1 TABLET BY MOUTH IN  THE MORNING   Vitamin D3 50 MCG (2000 UT) Tabs Take 2,000 Units by mouth daily.   Vitamin E 400 units Tabs Take 400 Units by mouth daily.        Allergies:  Allergies  Allergen Reactions   Iodine Shortness Of Breath    Iodine contrast, CHF , SOB   Shellfish Allergy Shortness Of Breath   Memantine     Malaise, fogginess/ couldn't think   Aspirin Nausea And Vomiting   Codeine Nausea And Vomiting    Family History: Family History  Problem Relation Age of Onset   Hypertension Mother    Arthritis Mother    Hypertension Father    Hypertension Brother    Hypertension Brother     Social History:  reports that she has never smoked. She has never used smokeless tobacco. She reports that she does not drink alcohol and does not use drugs.  ROS: Pertinent ROS in HPI  Physical Exam: BP 133/67    Pulse 65    Ht 4\' 11"  (1.499 m)    Wt 121 lb (54.9 kg)    LMP  (LMP Unknown)    BMI 24.44 kg/m   Constitutional:  Well nourished. Alert and oriented, No acute distress. HEENT: [place] AT, mask in place.  Trachea midline Cardiovascular: No clubbing, cyanosis, or edema. Respiratory: Normal respiratory effort, no increased work of breathing. GU: No CVA tenderness.  No bladder fullness or masses.  Atrophic external genitalia, normal  pubic hair distribution, no lesions.  Normal urethral meatus, no lesions, no prolapse, no discharge.   No urethral masses, tenderness and/or tenderness. No bladder fullness, tenderness or masses. Pale vagina mucosa, poor  estrogen effect, no discharge, no lesions, fair pelvic support, grade II cystocele and no rectocele noted.  Pessary in place.  Leakage was demonstrated during Valsalva.  Anus and perineum are without rashes or lesions.     Neurologic: Grossly intact, no focal deficits, moving all 4 extremities. Psychiatric: Normal mood and affect.    Laboratory Data: Lab Results  Component Value Date   WBC 8.8 12/21/2020   HGB 12.8 12/21/2020   HCT 37.9 12/21/2020   MCV 87.3 12/21/2020   PLT 141 (L) 12/21/2020    Lab Results  Component Value Date   CREATININE 0.85 12/21/2020    Lab Results  Component Value Date   TSH 0.67 11/17/2020       Component Value Date/Time   CHOL 171 11/17/2020 0808   CHOL 190 04/13/2015 1140   HDL 63 11/17/2020 0808   HDL 56 04/13/2015 1140   CHOLHDL 2.7 11/17/2020 0808   VLDL 17 02/27/2016 1348   LDLCALC 90 11/17/2020 0808    Lab Results  Component Value Date   AST 24 12/21/2020   Lab Results  Component Value Date   ALT 21 12/21/2020    Urinalysis    Component Value Date/Time   COLORURINE YELLOW 09/18/2020 1220   APPEARANCEUR HAZY (A) 09/18/2020 1220   APPEARANCEUR Clear 03/10/2020 0851   LABSPEC 1.016 09/18/2020 1220   PHURINE 7.0 09/18/2020 1220   GLUCOSEU NEGATIVE 09/18/2020 1220   HGBUR NEGATIVE 09/18/2020 1220   BILIRUBINUR NEGATIVE 09/18/2020 1220   BILIRUBINUR Negative 03/10/2020 0851   KETONESUR NEGATIVE 09/18/2020 1220   PROTEINUR NEGATIVE 09/18/2020 1220   UROBILINOGEN 0.2 12/16/2018 1610   UROBILINOGEN 0.2 11/22/2014 1241   NITRITE NEGATIVE 09/18/2020 1220   LEUKOCYTESUR NEGATIVE 09/18/2020 1220  I have reviewed the labs.   Pertinent Imaging: CLINICAL DATA:  Evaluate bilateral renal cysts.   EXAM: RENAL / URINARY TRACT ULTRASOUND COMPLETE   COMPARISON:  Renal ultrasound, dated May 18, 2020   FINDINGS: Right Kidney:   Renal measurements: 9.1 cm x 4.4 cm x 4.4 cm = volume: 90.08 mL. Echogenicity within normal limits.  A stable 1.1 cm x 1.0 cm x 1.2 cm anechoic structure is seen within the lower pole of the right kidney. No abnormal flow is seen within this region on color Doppler evaluation. No hydronephrosis is visualized.   Left Kidney:   Renal measurements: 9.5 cm x 5.0 cm x 4.4 cm = volume: 108.84 mL. Echogenicity within normal limits. A stable 1.9 cm x 1.5 cm x 1.9 cm anechoic focus is seen within the upper pole and lower pole of the left kidney. A new, similar appearing 0.7 cm x 0.6 cm x 0.8 cm anechoic focus is noted within the lower pole of the left kidney. No abnormal flow is seen within these regions on color Doppler evaluation. No hydronephrosis is visualized.   Bladder:   Appears normal for degree of bladder distention.   Other:   None.   IMPRESSION: 1. Stable bilateral simple renal cysts with a new subcentimeter cyst noted within the lower pole of the left kidney. Correlation with additional 1 year follow-up renal ultrasound is recommended to determine stability.     Electronically Signed   By: Virgina Norfolk M.D.   On: 05/02/2021 13:56 I have independently reviewed the films.  See HPI.    Assessment & Plan:    1. OAB -Not at goal with Myrbetriq 50 mg daily -Switch to Gemtesa 75 mg daily  2. Urge incontinence -Not at goal with Myrbetriq 50 mg daily -Explained that incontinence can be combination of both urge and stress and sometimes it is difficult to delineate which 1 is most bothersome -At this time, I suggested being fitted for a larger pessary, having a second opinion with Dr. Matilde Sprang or switching from [facility] to [place] -As they are exasperated from traveling back and forth regarding pessary fitting, they would like a trial of the Gemtesa so I have given them #28 samples and they would also like an appointment with Dr. Matilde Sprang to get his opinion regarding her symptoms  3. Renal cysts -Repeat renal ultrasound in 1 year  4. SUI -Patient and daughter are  frustrated as she still experiencing stress urinary incontinence after they have made 7 trips to the gynecologist for Pessary fitting  Return for Schedule appoint with Dr. Matilde Sprang for second opinion.  These notes generated with voice recognition software. I apologize for typographical errors.  Zara Council, PA-C  [facility] [address]  [place], [place] [address] [phone]

## 2021-05-10 ENCOUNTER — Encounter: Payer: Self-pay | Admitting: Urology

## 2021-05-10 ENCOUNTER — Ambulatory Visit (INDEPENDENT_AMBULATORY_CARE_PROVIDER_SITE_OTHER): Payer: Medicare Other | Admitting: Urology

## 2021-05-10 ENCOUNTER — Other Ambulatory Visit: Payer: Self-pay

## 2021-05-10 VITALS — BP 133/67 | HR 65 | Ht 59.0 in | Wt 121.0 lb

## 2021-05-10 DIAGNOSIS — N393 Stress incontinence (female) (male): Secondary | ICD-10-CM | POA: Diagnosis not present

## 2021-05-10 DIAGNOSIS — N3281 Overactive bladder: Secondary | ICD-10-CM

## 2021-05-10 DIAGNOSIS — N3941 Urge incontinence: Secondary | ICD-10-CM | POA: Diagnosis not present

## 2021-05-10 DIAGNOSIS — N281 Cyst of kidney, acquired: Secondary | ICD-10-CM | POA: Diagnosis not present

## 2021-05-10 LAB — BLADDER SCAN AMB NON-IMAGING

## 2021-05-10 MED ORDER — GEMTESA 75 MG PO TABS: 75.0000 mg | ORAL_TABLET | Freq: Every day | ORAL | 0 refills | Status: DC

## 2021-05-11 ENCOUNTER — Other Ambulatory Visit: Payer: Self-pay

## 2021-05-11 ENCOUNTER — Other Ambulatory Visit: Payer: Medicare Other

## 2021-05-11 DIAGNOSIS — I10 Essential (primary) hypertension: Secondary | ICD-10-CM

## 2021-05-11 DIAGNOSIS — I5022 Chronic systolic (congestive) heart failure: Secondary | ICD-10-CM

## 2021-05-11 LAB — CBC WITH DIFFERENTIAL/PLATELET
Absolute Monocytes: 738 cells/uL (ref 200–950)
Basophils Absolute: 78 cells/uL (ref 0–200)
Basophils Relative: 1.1 %
Eosinophils Absolute: 199 cells/uL (ref 15–500)
Eosinophils Relative: 2.8 %
HCT: 37.4 % (ref 35.0–45.0)
Hemoglobin: 12.4 g/dL (ref 11.7–15.5)
Lymphs Abs: 1335 cells/uL (ref 850–3900)
MCH: 29.5 pg (ref 27.0–33.0)
MCHC: 33.2 g/dL (ref 32.0–36.0)
MCV: 89 fL (ref 80.0–100.0)
MPV: 11.8 fL (ref 7.5–12.5)
Monocytes Relative: 10.4 %
Neutro Abs: 4750 cells/uL (ref 1500–7800)
Neutrophils Relative %: 66.9 %
Platelets: 122 10*3/uL — ABNORMAL LOW (ref 140–400)
RBC: 4.2 10*6/uL (ref 3.80–5.10)
RDW: 14 % (ref 11.0–15.0)
Total Lymphocyte: 18.8 %
WBC: 7.1 10*3/uL (ref 3.8–10.8)

## 2021-05-11 LAB — BASIC METABOLIC PANEL WITH GFR
BUN: 23 mg/dL (ref 7–25)
CO2: 30 mmol/L (ref 20–32)
Calcium: 10.4 mg/dL (ref 8.6–10.4)
Chloride: 102 mmol/L (ref 98–110)
Creat: 0.81 mg/dL (ref 0.60–0.95)
Glucose, Bld: 89 mg/dL (ref 65–99)
Potassium: 4.2 mmol/L (ref 3.5–5.3)
Sodium: 140 mmol/L (ref 135–146)
eGFR: 71 mL/min/{1.73_m2} (ref >=60)

## 2021-05-12 ENCOUNTER — Other Ambulatory Visit: Payer: Medicare Other

## 2021-05-15 ENCOUNTER — Ambulatory Visit (INDEPENDENT_AMBULATORY_CARE_PROVIDER_SITE_OTHER): Payer: Medicare Other | Admitting: Nurse Practitioner

## 2021-05-15 ENCOUNTER — Other Ambulatory Visit: Payer: Self-pay

## 2021-05-15 ENCOUNTER — Encounter: Payer: Self-pay | Admitting: Nurse Practitioner

## 2021-05-15 VITALS — BP 120/80 | HR 69 | Temp 97.3°F | Ht 59.0 in | Wt 123.0 lb

## 2021-05-15 DIAGNOSIS — G8929 Other chronic pain: Secondary | ICD-10-CM

## 2021-05-15 DIAGNOSIS — I10 Essential (primary) hypertension: Secondary | ICD-10-CM

## 2021-05-15 DIAGNOSIS — M5442 Lumbago with sciatica, left side: Secondary | ICD-10-CM

## 2021-05-15 DIAGNOSIS — I70213 Atherosclerosis of native arteries of extremities with intermittent claudication, bilateral legs: Secondary | ICD-10-CM | POA: Diagnosis not present

## 2021-05-15 DIAGNOSIS — D692 Other nonthrombocytopenic purpura: Secondary | ICD-10-CM

## 2021-05-15 DIAGNOSIS — M810 Age-related osteoporosis without current pathological fracture: Secondary | ICD-10-CM

## 2021-05-15 DIAGNOSIS — E034 Atrophy of thyroid (acquired): Secondary | ICD-10-CM

## 2021-05-15 DIAGNOSIS — F3181 Bipolar II disorder: Secondary | ICD-10-CM

## 2021-05-15 DIAGNOSIS — K5904 Chronic idiopathic constipation: Secondary | ICD-10-CM

## 2021-05-15 DIAGNOSIS — I5022 Chronic systolic (congestive) heart failure: Secondary | ICD-10-CM | POA: Diagnosis not present

## 2021-05-15 DIAGNOSIS — F0390 Unspecified dementia without behavioral disturbance: Secondary | ICD-10-CM

## 2021-05-15 DIAGNOSIS — M5441 Lumbago with sciatica, right side: Secondary | ICD-10-CM

## 2021-05-15 DIAGNOSIS — N3281 Overactive bladder: Secondary | ICD-10-CM

## 2021-05-15 DIAGNOSIS — C50512 Malignant neoplasm of lower-outer quadrant of left female breast: Secondary | ICD-10-CM

## 2021-05-15 MED ORDER — DENOSUMAB 60 MG/ML ~~LOC~~ SOSY
60.0000 mg | PREFILLED_SYRINGE | Freq: Once | SUBCUTANEOUS | Status: AC
  Administered 2021-05-15: 60 mg via SUBCUTANEOUS

## 2021-05-15 NOTE — Progress Notes (Signed)
Remote ICD transmission.   

## 2021-05-15 NOTE — Patient Instructions (Addendum)
Ask psychiatrist about changing zoloft to cymbalta to help with your pain.   Reduce trileptal to 300 mg daily

## 2021-05-15 NOTE — Progress Notes (Signed)
Careteam: Patient Care Team: [name], NP as PCP - General (Geriatric Medicine) [name], MD as Consulting Physician (General Surgery) Paralee Cancel, MD as Consulting Physician (Orthopedic Surgery) Marica Otter, [place] (Optometry) Marcial Pacas, MD as Consulting Physician (Neurology) [name], MD as Consulting Physician (Cardiology) Ricard Dillon, MD (Psychiatry)  PLACE OF SERVICE:  [place] Directive information Does Patient Have a Medical Advance Directive?: Yes, Type of Advance Directive: Living will;Out of facility DNR (pink MOST or yellow form), Pre-existing out of facility DNR order (yellow form or pink MOST form): Yellow form placed in chart (order not valid for inpatient use), Does patient want to make changes to medical advance directive?: No - Patient declined  Allergies  Allergen Reactions   Iodine Shortness Of Breath    Iodine contrast, CHF , SOB   Shellfish Allergy Shortness Of Breath   Memantine     Malaise, fogginess/ couldn't think   Aspirin Nausea And Vomiting   Codeine Nausea And Vomiting    Chief Complaint  Patient presents with   Medical Management of Chronic Issues    6 month follow up and Prolia injection. Patient still having chronic from sciatic nerve to feet in both legs, but mostly right leg. Concerns about blood pressure since lowering dose of blood pressure medication.   Health Maintenance    Mammogram, shingrix vaccine     HPI: Patient is a 88 y.o. female for routine follow up  HTN- norvasc was decreased to 2.5 from 5 mg in May. sbp occasionally in the 140s but mostly <140.   Dizziness- continues to have dizziness but not as frequent. Overall has improved since last visit.   Breast cancer s/p mastectomy- followed by oncologist every 6 months was not a good candidate for chemo.   Since November her pain has been getting worse, went to pain management and [facility]- she was placed on pain patch  originally butrans  5 mg and now increased to 7.5 mg but does not feel like this has offered her much benefit. Feels like she is more foggy headed, slower finding words per daughter.  Dr Nelva Bush gave her injections but they did nothing.   Her trileptal increase from 300 mg to 600 mg - this has significantly increased her fatigue did not help the pain.  She has 300 mg tablets at home.   Overactive bladder- plans to change to gemtesa with next refill per urologist.  Having bladder leakage, has a persery and is not staying in, she plans to follow up with this.   Dose reduction of Wellbutrin went well. She is more anxious at this time.     Review of Systems:  Review of Systems  Constitutional:  Negative for chills, fever and weight loss.  HENT:  Negative for tinnitus.   Respiratory:  Negative for cough, sputum production and shortness of breath.   Cardiovascular:  Negative for chest pain, palpitations and leg swelling.  Gastrointestinal:  Negative for abdominal pain, constipation, diarrhea and heartburn.  Genitourinary:  Negative for dysuria, frequency and urgency.  Musculoskeletal:  Positive for back pain. Negative for falls, joint pain and myalgias.  Skin: Negative.   Neurological:  Positive for tingling. Negative for dizziness and headaches.  Psychiatric/Behavioral:  Negative for depression and memory loss. The patient is nervous/anxious. The patient does not have insomnia.    Past Medical History:  Diagnosis Date   Arthritis    Cancer ([place])    left breast cancer  Cataracts, bilateral    removed by surgery   CHF (congestive heart failure) ([place])    PACEMAKER & DEFIB   Complication of anesthesia    hypotensive after back surgery in 2006   Depression    Dyslipidemia    Fainted 04/21/06   AT [facility]   GERD (gastroesophageal reflux disease)    Headache(784.0)    Hearing loss    bilateral hearing aids   HLD (hyperlipidemia)    diet controlled    Hypertension    Hypothyroidism     ICD (implantable cardiac defibrillator) in place    pt has pacer/icd   ICD (implantable cardiac defibrillator), biventricular, in situ    LBBB (left bundle branch block)    Memory loss    Nonischemic cardiomyopathy ([place])    Normal coronary arteries    s/p cardiac cath 2007   Pacemaker    ICD [employer]   Syncope    Systolic CHF [facility])    Vertigo    Wears glasses    Past Surgical History:  Procedure Laterality Date   BACK SURGERY     lumbar fusion    BREAST LUMPECTOMY Left 2008   BREAST SURGERY  2000   LUMP REMOVAL. STAGE 1 CANCER   CARDIAC CATHETERIZATION     CATARACT EXTRACTION     COLONOSCOPY     EYE SURGERY     IMPLANTABLE CARDIOVERTER DEFIBRILLATOR GENERATOR CHANGE N/A 12/18/2012   Procedure: IMPLANTABLE CARDIOVERTER DEFIBRILLATOR GENERATOR CHANGE;  Surgeon: [name], MD;  Location: [facility] CATH LAB;  Service: Cardiovascular;  Laterality: N/A;   JOINT REPLACEMENT  06/14/01   right   LUMBAR FUSION  2006   MASS EXCISION  11/08/2011   Procedure: EXCISION MASS;  Surgeon: [name], MD;  Location: [facility];  Service: General;  Laterality: Left;  Excision Left Thigh Mass   MASTECTOMY W/ SENTINEL NODE BIOPSY Left 06/04/2019   Procedure: LEFT MASTECTOMY WITH SENTINEL LYMPH NODE BIOPSY;  Surgeon: [name], MD;  Location: [place];  Service: General;  Laterality: Left;   MASTECTOMY, PARTIAL  2008   GOT PACEMAKER AND DEFIB AT THAT TIME   PACEMAKER INSERTION  04/23/06   TOTAL KNEE ARTHROPLASTY  05/17/01   RIGHT KNEE   TOTAL KNEE ARTHROPLASTY Left 11/29/2014   Procedure: TOTAL LEFT KNEE ARTHROPLASTY;  Surgeon: Paralee Cancel, MD;  Location: [facility];  Service: Orthopedics;  Laterality: Left;   Social History:   reports that she has never smoked. She has never used smokeless tobacco. She reports that she does not drink alcohol and does not use drugs.  Family History  Problem Relation Age of Onset   Hypertension Mother    Arthritis Mother    Hypertension Father    Hypertension  Brother    Hypertension Brother     Medications: Patient's Medications  New Prescriptions   No medications on file  Previous Medications   ACETAMINOPHEN (TYLENOL) 500 MG TABLET    Take 500 mg by mouth in the morning and at bedtime. 500 mg in the morning and afternoon   ACETAMINOPHEN (TYLENOL) 650 MG CR TABLET    Take 650 mg by mouth at bedtime.   ALBUTEROL (VENTOLIN HFA) 108 (90 BASE) MCG/ACT INHALER    Inhale 2 puffs into the lungs every 6 (six) hours as needed for wheezing or shortness of breath.   AMLODIPINE (NORVASC) 5 MG TABLET    Take 0.5 tablets (2.5 mg total) by mouth daily.   ASCORBIC ACID (VITAMIN C) 500 MG TABLET  Take 500 mg by mouth daily.   BUPRENORPHINE (BUTRANS) 7.5 MCG/HR    buprenorphine 7.5 mcg/hour weekly transdermal patch   BUPROPION (WELLBUTRIN XL) 150 MG 24 HR TABLET    Take 1 tablet (150 mg total) by mouth daily.   CARVEDILOL (COREG) 3.125 MG TABLET    TAKE 1 TABLET BY MOUTH  TWICE DAILY   CHOLECALCIFEROL (VITAMIN D3) 50 MCG (2000 UT) TABS    Take 2,000 Units by mouth daily.    DICLOFENAC SODIUM (VOLTAREN) 1 % GEL    Apply 4 g topically 4 (four) times daily.   FLUTICASONE (FLONASE) 50 MCG/ACT NASAL SPRAY    Place 2 sprays into both nostrils daily.   FUROSEMIDE (LASIX) 20 MG TABLET    TAKE 1 TABLET BY MOUTH  DAILY   LEVOTHYROXINE (SYNTHROID, LEVOTHROID) 100 MCG TABLET    Take 1 tablet (100 mcg total) by mouth daily before breakfast.   LIDOCAINE 4 % PTCH    Apply 1 patch topically every 12 (twelve) hours. Apply to back   MAGNESIUM 250 MG TABS    Take 250 mg by mouth daily.   MECLIZINE (ANTIVERT) 12.5 MG TABLET    Take 1 tablet (12.5 mg total) by mouth 3 (three) times daily as needed for dizziness.   NONFORMULARY OR COMPOUNDED ITEM    Apply 120 Tubes topically daily. Diclofenac/Cyclobenzaprine/lamotrigine/lidocaine/prilocaine (10480) 2%/2%/6%/5%/1.25% cream QTY: 120 GM SIG: (NEURO) APPLY 1-2 PUMPS (1-2 GMS) TO AFFECTED AREA (S) OR FOCAL POINTS 3 TO 4 TIMES DAILY. RUB  IN FOR 2 MINUTES TO ACHIEVE MAX PENETRATION. [place] HANDS WELL.   OMEGA-3 FATTY ACIDS (FISH OIL PO)    Take 1 capsule by mouth daily.   OMEPRAZOLE (PRILOSEC) 40 MG CAPSULE    Take 1 capsule (40 mg total) by mouth daily.   OXCARBAZEPINE (TRILEPTAL) 600 MG TABLET    Take 1 tablet (600 mg total) by mouth at bedtime.   POLYETHYLENE GLYCOL POWDER (GLYCOLAX/MIRALAX) 17 GM/SCOOP POWDER    Take 1 Container by mouth as needed.   POTASSIUM CHLORIDE (KLOR-CON) 10 MEQ TABLET    TAKE 1 TABLET BY MOUTH  DAILY   PREGABALIN (LYRICA) 25 MG CAPSULE    Take 1 capsule (25 mg total) by mouth 2 (two) times daily.   SENNA-DOCUSATE (SENOKOT S) 8.6-50 MG TABLET    Take 2 tablets by mouth daily.   SERTRALINE (ZOLOFT) 100 MG TABLET    Take 200 mg by mouth daily.    SPIRONOLACTONE (ALDACTONE) 25 MG TABLET    TAKE 1 TABLET BY MOUTH IN  THE MORNING   VIBEGRON (GEMTESA) 75 MG TABS    Take 75 mg by mouth daily.   VITAMIN E 400 UNITS TABS    Take 400 Units by mouth daily.   Modified Medications   No medications on file  Discontinued Medications   BUPRENORPHINE (BUTRANS) 5 MCG/HR PTWK    Place 1 patch onto the skin once a week.    Physical Exam:  Vitals:   05/15/21 1426  BP: 120/80  Pulse: 69  Temp: (!) 97.3 F (36.3 C)  SpO2: 95%  Weight: 123 lb (55.8 kg)  Height: 4\' 11"  (1.499 m)   Body mass index is 24.84 kg/m. Wt Readings from Last 3 Encounters:  05/15/21 123 lb (55.8 kg)  05/10/21 121 lb (54.9 kg)  05/05/21 121 lb 9.6 oz (55.2 kg)    Physical Exam Constitutional:      General: She is not in acute distress.    Appearance: She is  well-developed. She is not diaphoretic.  HENT:     Head: Normocephalic and atraumatic.     Mouth/Throat:     Pharynx: No oropharyngeal exudate.  Eyes:     Conjunctiva/sclera: Conjunctivae normal.     Pupils: Pupils are equal, round, and reactive to light.  Cardiovascular:     Rate and Rhythm: Normal rate and regular rhythm.     Heart sounds: Normal heart sounds.   Pulmonary:     Effort: Pulmonary effort is normal.     Breath sounds: Normal breath sounds.  Abdominal:     General: Bowel sounds are normal.     Palpations: Abdomen is soft.  Musculoskeletal:     Cervical back: Normal range of motion and neck supple.     Right lower leg: No edema.     Left lower leg: No edema.  Skin:    General: Skin is warm and dry.  Neurological:     Mental Status: She is alert.  Psychiatric:        Mood and Affect: Mood normal.    Labs reviewed: Basic Metabolic Panel: Recent Labs    11/17/20 0808 12/21/20 1009 05/11/21 0838  NA 139 141 140  K 4.4 4.5 4.2  CL 104 104 102  CO2 28 29 30   GLUCOSE 95 107* 89  BUN 24 21 23   CREATININE 0.70 0.85 0.81  CALCIUM 9.8 10.6* 10.4  TSH 0.67  --   --    Liver Function Tests: Recent Labs    06/20/20 0944 09/18/20 1210 12/21/20 1009  AST 26 26 24   ALT 26 22 21   ALKPHOS 68 48 53  BILITOT 0.3 0.4 0.3  PROT 7.8 7.1 7.3  ALBUMIN 4.0 4.0 3.9   No results for input(s): LIPASE, AMYLASE in the last 8760 hours. No results for input(s): AMMONIA in the last 8760 hours. CBC: Recent Labs    11/17/20 0808 12/21/20 1009 05/11/21 0838  WBC 5.7 8.8 7.1  NEUTROABS 3,500 6.2 4,750  HGB 12.7 12.8 12.4  HCT 39.6 37.9 37.4  MCV 89.0 87.3 89.0  PLT 128* 141* 122*   Lipid Panel: Recent Labs    11/17/20 0808  CHOL 171  HDL 63  LDLCALC 90  TRIG 86  CHOLHDL 2.7   TSH: Recent Labs    11/17/20 0808  TSH 0.67   A1C: No results found for: HGBA1C   Assessment/Plan 1. Atherosclerosis of native artery of both lower extremities with intermittent claudication (HCC) Stable, allergy to asa   2. Chronic systolic heart failure (HCC) Stable, continues on aldactone, lasix, potassium and coreg    3. Senile purpura (HCC) Stable, continue to monitor.   4. Bipolar II disorder ([place]) Stable, continues to follow up with psych, reports feeling more anxious and will talk with psychiatrist in regards to this.  She may  benefit from cymbalta (vs zoloft) due to her uncontrolled back pain but will have her consult with psychiatrist.   5. Dementia without behavioral disturbance ([place]) Stable, no acute changes in cognitive or functional status, continue supportive care.   6. Malignant neoplasm of lower-outer quadrant of left female breast, unspecified estrogen receptor status ([place]) S/p mastectomy, followed by oncologist.   7. Hypothyroidism due to acquired atrophy of thyroid TSH at goal on last labs, continues on synthroid.   8. Age-related osteoporosis without current pathological fracture Continues on prolia. Recommended to take calcium 600 mg twice daily with Vitamin D 2000 units daily and weight bearing activity 30 mins/5 days a week  9. Essential hypertension -Blood pressure well controlled Continue current medications Recheck metabolic panel  10. Chronic bilateral low back pain with bilateral sciatica Ongoing pain, followed by pain management. Continues on lyrica, butrans patch and muscle relaxer.  ?if cymbalta would benefit, she will ask her psychiatrist if he feels like this change is appropriate.   11. Chronic idiopathic constipation -has done miralax, senakot and increase in fiber which has been helpful.   12. Overactive bladder -ongoing, plans to followup with urologist.    Return in about 3 months (around 08/12/2021) for routine follow up .: 2 Atziry Baranski K. Saunemin, [facility] [phone]

## 2021-05-16 ENCOUNTER — Encounter: Payer: Self-pay | Admitting: Neurology

## 2021-05-17 ENCOUNTER — Other Ambulatory Visit: Payer: Self-pay | Admitting: *Deleted

## 2021-05-17 ENCOUNTER — Ambulatory Visit: Payer: Medicare Other | Admitting: Nurse Practitioner

## 2021-05-17 MED ORDER — NONFORMULARY OR COMPOUNDED ITEM: 120.0000 | Freq: Every day | 2 refills | Status: DC

## 2021-05-17 NOTE — Telephone Encounter (Signed)
Patient requested refill.  Printed Rx and sent to [facility]. Fax:[phone]

## 2021-05-23 ENCOUNTER — Ambulatory Visit (INDEPENDENT_AMBULATORY_CARE_PROVIDER_SITE_OTHER): Payer: Medicare Other | Admitting: Family

## 2021-05-23 ENCOUNTER — Other Ambulatory Visit: Payer: Self-pay

## 2021-05-23 ENCOUNTER — Encounter: Payer: Self-pay | Admitting: Family

## 2021-05-23 VITALS — BP 122/80 | HR 65 | Temp 98.0°F | Resp 15 | Wt 122.0 lb

## 2021-05-23 DIAGNOSIS — I5022 Chronic systolic (congestive) heart failure: Secondary | ICD-10-CM

## 2021-05-23 DIAGNOSIS — R600 Localized edema: Secondary | ICD-10-CM

## 2021-05-23 MED ORDER — FUROSEMIDE 20 MG PO TABS: ORAL_TABLET | ORAL | 0 refills | Status: DC

## 2021-05-23 NOTE — Patient Instructions (Signed)
-   Keep legs elevated when seated   - check weight daily x 5 days then three times per week.Notify provider for any abrupt weight gain > 3 lbs  - Increased Furosemide from 20 mg tablet to 40 mg tablet daily x 5 days then decrease to 20 mg tablet daily.   - continue to wear knee compression stocking on in the morning and off at bedtime for edema

## 2021-05-28 NOTE — Progress Notes (Signed)
Provider: Tijah Hane FNP-C  [name], NP  Patient Care Team: [name], NP as PCP - General (Geriatric Medicine) [name], MD as Consulting Physician (General Surgery) Paralee Cancel, MD as Consulting Physician (Orthopedic Surgery) Marica Otter, [place] (Optometry) Marcial Pacas, MD as Consulting Physician (Neurology) [name], MD as Consulting Physician (Cardiology) Ricard Dillon, MD (Psychiatry)  Extended Emergency Contact Information Primary Emergency Contact: Juel Burrow Address: [address]          [place], [place] [address] Johnnette Litter of [place] Phone: [phone] Relation: Daughter Secondary Emergency Contact: [facility] Address: [address]          [place], [place] [address] Johnnette Litter of [place] Mobile Phone: [phone] Relation: Friend  Code Status:  DNR Goals of care: Advanced Directive information Advanced Directives 05/23/2021  Does Patient Have a Medical Advance Directive? Yes  Type of Advance Directive Living will;Out of facility DNR (pink MOST or yellow form)  Does patient want to make changes to medical advance directive? No - Patient declined  Copy of [place] in Chart? -  Would patient like information on creating a medical advance directive? -  Pre-existing out of facility DNR order (yellow form or pink MOST form) -     Chief Complaint  Patient presents with   Acute Visit    Patient complains of pain in both feet but more pain in the right. Patient stated that she noticed it about a week ago. Patient stated that at night her feet felt soft to the touch. Patient is present with daughter.     HPI:  Pt is a 88 y.o. female seen today for an acute visit for evaluation of pain and swelling on the legs x 1 week ago.she is here with her daughter.right leg more painful than the left.Currently on furosemide 20 mg tablet.Has had 9 lbs weight over 2 months.she denies Denies chest pain,  shortness of breath, fatigue, palpitations, orthopnea, and PND.  Past Medical History:  Diagnosis Date   Arthritis    Cancer ([facility])    left breast cancer    Cataracts, bilateral    removed by surgery   CHF (congestive heart failure) ([place])    PACEMAKER & DEFIB   Complication of anesthesia    hypotensive after back surgery in 2006   Depression    Dyslipidemia    Fainted 04/21/06   AT [facility]   GERD (gastroesophageal reflux disease)    Headache(784.0)    Hearing loss    bilateral hearing aids   HLD (hyperlipidemia)    diet controlled    Hypertension    Hypothyroidism    ICD (implantable cardiac defibrillator) in place    pt has pacer/icd   ICD (implantable cardiac defibrillator), biventricular, in situ    LBBB (left bundle branch block)    Memory loss    Nonischemic cardiomyopathy ([place])    Normal coronary arteries    s/p cardiac cath 2007   Pacemaker    ICD [employer]   Syncope    Systolic CHF [facility])    Vertigo    Wears glasses    Past Surgical History:  Procedure Laterality Date   BACK SURGERY     lumbar fusion    BREAST LUMPECTOMY Left 2008   BREAST SURGERY  2000   LUMP REMOVAL. STAGE 1 CANCER   CARDIAC CATHETERIZATION     CATARACT EXTRACTION     COLONOSCOPY     EYE SURGERY  IMPLANTABLE CARDIOVERTER DEFIBRILLATOR GENERATOR CHANGE N/A 12/18/2012   Procedure: IMPLANTABLE CARDIOVERTER DEFIBRILLATOR GENERATOR CHANGE;  Surgeon: [name], MD;  Location: [facility] CATH LAB;  Service: Cardiovascular;  Laterality: N/A;   JOINT REPLACEMENT  06/14/01   right   LUMBAR FUSION  2006   MASS EXCISION  11/08/2011   Procedure: EXCISION MASS;  Surgeon: [name], MD;  Location: [facility] ORS;  Service: General;  Laterality: Left;  Excision Left Thigh Mass   MASTECTOMY W/ SENTINEL NODE BIOPSY Left 06/04/2019   Procedure: LEFT MASTECTOMY WITH SENTINEL LYMPH NODE BIOPSY;  Surgeon: [name], MD;  Location: [facility];  Service: General;  Laterality: Left;   MASTECTOMY, PARTIAL  2008    GOT PACEMAKER AND DEFIB AT THAT TIME   PACEMAKER INSERTION  04/23/06   TOTAL KNEE ARTHROPLASTY  05/17/01   RIGHT KNEE   TOTAL KNEE ARTHROPLASTY Left 11/29/2014   Procedure: TOTAL LEFT KNEE ARTHROPLASTY;  Surgeon: Paralee Cancel, MD;  Location: [facility];  Service: Orthopedics;  Laterality: Left;    Allergies  Allergen Reactions   Iodine Shortness Of Breath    Iodine contrast, CHF , SOB   Shellfish Allergy Shortness Of Breath   Memantine     Malaise, fogginess/ couldn't think   Aspirin Nausea And Vomiting   Codeine Nausea And Vomiting    Outpatient Encounter Medications as of 05/23/2021  Medication Sig   acetaminophen (TYLENOL) 500 MG tablet Take 500 mg by mouth in the morning and at bedtime. 500 mg in the morning and afternoon   acetaminophen (TYLENOL) 650 MG CR tablet Take 650 mg by mouth at bedtime.   albuterol (VENTOLIN HFA) 108 (90 Base) MCG/ACT inhaler Inhale 2 puffs into the lungs every 6 (six) hours as needed for wheezing or shortness of breath.   amLODipine (NORVASC) 5 MG tablet Take 0.5 tablets (2.5 mg total) by mouth daily.   ascorbic acid (VITAMIN C) 500 MG tablet Take 500 mg by mouth daily.   buprenorphine-naloxone (SUBOXONE) 2-0.5 mg SUBL SL tablet Place 1 tablet under the tongue daily.   buPROPion (WELLBUTRIN XL) 150 MG 24 hr tablet Take 1 tablet (150 mg total) by mouth daily.   carvedilol (COREG) 3.125 MG tablet TAKE 1 TABLET BY MOUTH  TWICE DAILY   Cholecalciferol (VITAMIN D3) 50 MCG (2000 UT) TABS Take 2,000 Units by mouth daily.    diclofenac Sodium (VOLTAREN) 1 % GEL Apply 4 g topically 4 (four) times daily.   fluticasone (FLONASE) 50 MCG/ACT nasal spray Place 2 sprays into both nostrils daily.   levothyroxine (SYNTHROID, LEVOTHROID) 100 MCG tablet Take 1 tablet (100 mcg total) by mouth daily before breakfast.   Lidocaine 4 % PTCH Apply 1 patch topically every 12 (twelve) hours. Apply to back   Magnesium 250 MG TABS Take 250 mg by mouth daily.   meclizine (ANTIVERT) 12.5  MG tablet Take 1 tablet (12.5 mg total) by mouth 3 (three) times daily as needed for dizziness.   NONFORMULARY OR COMPOUNDED ITEM Apply 120 Tubes topically daily. Diclofenac/Cyclobenzaprine/lamotrigine/lidocaine/prilocaine (10480) 2%/2%/6%/5%/1.25% cream QTY: 120 GM SIG: (NEURO) APPLY 1-2 PUMPS (1-2 GMS) TO AFFECTED AREA (S) OR FOCAL POINTS 3 TO 4 TIMES DAILY. RUB IN FOR 2 MINUTES TO ACHIEVE MAX PENETRATION. [place] HANDS WELL.   Omega-3 Fatty Acids (FISH OIL PO) Take 1 capsule by mouth daily.   omeprazole (PRILOSEC) 40 MG capsule Take 1 capsule (40 mg total) by mouth daily.   oxcarbazepine (TRILEPTAL) 600 MG tablet Take 1 tablet (600 mg total) by mouth  at bedtime. (Patient taking differently: Take 300 mg by mouth at bedtime.)   polyethylene glycol powder (GLYCOLAX/MIRALAX) 17 GM/SCOOP powder Take 1 Container by mouth as needed.   potassium chloride (KLOR-CON) 10 MEQ tablet TAKE 1 TABLET BY MOUTH  DAILY   pregabalin (LYRICA) 25 MG capsule Take 1 capsule (25 mg total) by mouth 2 (two) times daily.   senna-docusate (SENOKOT S) 8.6-50 MG tablet Take 2 tablets by mouth daily.   sertraline (ZOLOFT) 100 MG tablet Take 200 mg by mouth daily.    spironolactone (ALDACTONE) 25 MG tablet TAKE 1 TABLET BY MOUTH IN  THE MORNING   Vibegron (GEMTESA) 75 MG TABS Take 75 mg by mouth daily.   Vitamin E 400 units TABS Take 400 Units by mouth daily.    [DISCONTINUED] furosemide (LASIX) 20 MG tablet TAKE 1 TABLET BY MOUTH  DAILY   furosemide (LASIX) 20 MG tablet Take 2 tablets  (40 mg)  by mouth daily x 5 days then resume one tablet ( 20 mg tablet ) by mouth daily.   [DISCONTINUED] buprenorphine (BUTRANS) 7.5 MCG/HR buprenorphine 7.5 mcg/hour weekly transdermal patch (Patient not taking: Reported on 05/23/2021)   No facility-administered encounter medications on file as of 05/23/2021.    Review of Systems  Constitutional:  Negative for appetite change, chills, fatigue and fever.       Has gained 9 lbs over 2 months    HENT:  Negative for congestion, dental problem, ear discharge, ear pain, facial swelling, hearing loss, nosebleeds, postnasal drip, rhinorrhea, sinus pressure, sinus pain, sneezing and sore throat.   Eyes:  Negative for pain, discharge, redness, itching and visual disturbance.  Respiratory:  Negative for cough, chest tightness, shortness of breath and wheezing.   Cardiovascular:  Positive for leg swelling. Negative for chest pain and palpitations.  Gastrointestinal:  Negative for abdominal distention, abdominal pain, blood in stool, constipation, diarrhea, nausea and vomiting.  Genitourinary:  Negative for urgency.  Skin:  Negative for color change, pallor and rash.  Neurological:  Negative for dizziness, syncope, speech difficulty, weakness, light-headedness, numbness and headaches.   Immunization History  Administered Date(s) Administered   Fluad Quad(high Dose 65+) 01/28/2020, 01/17/2021   Influenza, High Dose Seasonal PF 01/16/2018, 12/08/2018   Influenza-Unspecified 12/08/2013, 01/10/2015, 12/09/2015, 01/12/2017, 01/17/2021   PFIZER(Purple Top)SARS-COV-2 Vaccination 05/23/2019, 06/15/2019, 12/23/2019, 08/30/2020   Pfizer Covid-19 Vaccine Bivalent Booster 21yrs & up 02/03/2021   Pneumococcal Conjugate-13 04/09/2012   Pneumococcal Polysaccharide-23 02/28/2016   Tdap 01/07/2013   Zoster Recombinat (Shingrix) 02/03/2021   Zoster, Live 08/05/2014   Pertinent  Health Maintenance Due  Topic Date Due   MAMMOGRAM  03/18/2021   INFLUENZA VACCINE  Completed   DEXA SCAN  Completed   Fall Risk 01/09/2021 03/20/2021 05/05/2021 05/15/2021 05/23/2021  Falls in the past year? - 0 0 0 0  Was there an injury with Fall? - 0 0 0 0  Was there an injury with Fall? - - - - -  Fall Risk Category Calculator - 0 0 0 0  Fall Risk Category - Low Low Low Low  Patient Fall Risk Level Low fall risk - Low fall risk Low fall risk Low fall risk  Patient at Risk for Falls Due to - - - No Fall Risks No Fall Risks   Fall risk Follow up - - - Falls evaluation completed Falls evaluation completed   Functional Status Survey:    Vitals:   05/23/21 1429  BP: 122/80  Pulse: 65  Resp: 15  Temp:  98 F (36.7 C)  SpO2: 98%  Weight: 122 lb (55.3 kg)   Body mass index is 24.64 kg/m. Physical Exam Vitals reviewed.  Constitutional:      General: She is not in acute distress.    Appearance: Normal appearance. She is normal weight. She is not ill-appearing or diaphoretic.  HENT:     Head: Normocephalic.     Right Ear: Tympanic membrane, ear canal and external ear normal. There is no impacted cerumen.     Left Ear: Tympanic membrane, ear canal and external ear normal. There is no impacted cerumen.     Nose: Nose normal. No congestion or rhinorrhea.     Mouth/Throat:     Mouth: Mucous membranes are moist.     Pharynx: Oropharynx is clear. No oropharyngeal exudate or posterior oropharyngeal erythema.  Eyes:     General: No scleral icterus.       Right eye: No discharge.        Left eye: No discharge.     Extraocular Movements: Extraocular movements intact.     Conjunctiva/sclera: Conjunctivae normal.     Pupils: Pupils are equal, round, and reactive to light.  Neck:     Vascular: No carotid bruit.  Cardiovascular:     Rate and Rhythm: Normal rate and regular rhythm.     Pulses: Normal pulses.     Heart sounds: Normal heart sounds. No murmur heard.   No friction rub. No gallop.  Pulmonary:     Effort: Pulmonary effort is normal. No respiratory distress.     Breath sounds: Normal breath sounds. No wheezing, rhonchi or rales.  Chest:     Chest wall: No tenderness.  Abdominal:     General: Bowel sounds are normal. There is no distension.     Palpations: Abdomen is soft. There is no mass.     Tenderness: There is no abdominal tenderness. There is no right CVA tenderness, left CVA tenderness, guarding or rebound.  Musculoskeletal:        General: No swelling or tenderness. Normal range of motion.      Cervical back: Normal range of motion. No rigidity or tenderness.     Right lower leg: Edema present.     Left lower leg: Edema present.  Lymphadenopathy:     Cervical: No cervical adenopathy.  Skin:    General: Skin is warm and dry.     Coloration: Skin is not pale.     Findings: No bruising, erythema, lesion or rash.  Neurological:     Mental Status: She is alert and oriented to person, place, and time.     Cranial Nerves: No cranial nerve deficit.     Sensory: No sensory deficit.     Motor: No weakness.     Coordination: Coordination normal.     Gait: Gait abnormal.  Psychiatric:        Mood and Affect: Mood normal.        Speech: Speech normal.        Behavior: Behavior normal.        Thought Content: Thought content normal.        Judgment: Judgment normal.    Labs reviewed: Recent Labs    11/17/20 0808 12/21/20 1009 05/11/21 0838  NA 139 141 140  K 4.4 4.5 4.2  CL 104 104 102  CO2 28 29 30   GLUCOSE 95 107* 89  BUN 24 21 23   CREATININE 0.70 0.85 0.81  CALCIUM 9.8 10.6* 10.4  Recent Labs    06/20/20 0944 09/18/20 1210 12/21/20 1009  AST 26 26 24   ALT 26 22 21   ALKPHOS 68 48 53  BILITOT 0.3 0.4 0.3  PROT 7.8 7.1 7.3  ALBUMIN 4.0 4.0 3.9   Recent Labs    11/17/20 0808 12/21/20 1009 05/11/21 0838  WBC 5.7 8.8 7.1  NEUTROABS 3,500 6.2 4,750  HGB 12.7 12.8 12.4  HCT 39.6 37.9 37.4  MCV 89.0 87.3 89.0  PLT 128* 141* 122*   Lab Results  Component Value Date   TSH 0.67 11/17/2020   No results found for: HGBA1C Lab Results  Component Value Date   CHOL 171 11/17/2020   HDL 63 11/17/2020   LDLCALC 90 11/17/2020   TRIG 86 11/17/2020   CHOLHDL 2.7 11/17/2020    Significant Diagnostic Results in last 30 days:  US RENAL  Result Date: 05/02/2021 CLINICAL DATA:  Evaluate bilateral renal cysts. EXAM: RENAL / URINARY TRACT ULTRASOUND COMPLETE COMPARISON:  Renal ultrasound, dated May 18, 2020 FINDINGS: Right Kidney: Renal measurements: 9.1 cm  x 4.4 cm x 4.4 cm = volume: 90.08 mL. Echogenicity within normal limits. A stable 1.1 cm x 1.0 cm x 1.2 cm anechoic structure is seen within the lower pole of the right kidney. No abnormal flow is seen within this region on color Doppler evaluation. No hydronephrosis is visualized. Left Kidney: Renal measurements: 9.5 cm x 5.0 cm x 4.4 cm = volume: 108.84 mL. Echogenicity within normal limits. A stable 1.9 cm x 1.5 cm x 1.9 cm anechoic focus is seen within the upper pole and lower pole of the left kidney. A new, similar appearing 0.7 cm x 0.6 cm x 0.8 cm anechoic focus is noted within the lower pole of the left kidney. No abnormal flow is seen within these regions on color Doppler evaluation. No hydronephrosis is visualized. Bladder: Appears normal for degree of bladder distention. Other: None. IMPRESSION: 1. Stable bilateral simple renal cysts with a new subcentimeter cyst noted within the lower pole of the left kidney. Correlation with additional 1 year follow-up renal ultrasound is recommended to determine stability. Electronically Signed   By: Virgina Norfolk M.D.   On: 05/02/2021 13:56   CUP PACEART REMOTE DEVICE CHECK  Result Date: 05/04/2021 Scheduled remote reviewed. Normal device function.  2 ATR's, PAT, 1-5sec Next remote 91 days. LA   Assessment/Plan  1. Chronic systolic heart failure (HCC) Has had increased leg edema with 9 lbs weight gain.No shortness of breath. Advised to increase furosemide as below.  - furosemide (LASIX) 20 MG tablet; Take 2 tablets  (40 mg)  by mouth daily x 5 days then resume one tablet ( 20 mg tablet ) by mouth daily.  Dispense: 60 tablet; Refill: 0  2. Edema of both lower extremities Worsening bilateral leg edema. Adjust furosemide as below. - Knee compression stockings on in the morning and off at bedtime - furosemide (LASIX) 20 MG tablet; Take 2 tablets  (40 mg)  by mouth daily x 5 days then resume one tablet ( 20 mg tablet ) by mouth daily.  Dispense: 60  tablet; Refill: 0 - Keep legs elevated when seated  - check weight daily x 5 days then three times per week.Notify provider for any abrupt weight gain > 3 lbs  Family/ staff Communication: Reviewed plan of care with patient and daughter verbalized understanding   Labs/tests ordered: None   Next Appointment: As needed if symptoms worsen or fail to improve    [name]  Shelva Majestic, NP

## 2021-05-30 ENCOUNTER — Other Ambulatory Visit: Payer: Self-pay

## 2021-05-30 ENCOUNTER — Ambulatory Visit (INDEPENDENT_AMBULATORY_CARE_PROVIDER_SITE_OTHER): Payer: Medicare Other | Admitting: Nurse Practitioner

## 2021-05-30 ENCOUNTER — Telehealth: Payer: Self-pay

## 2021-05-30 ENCOUNTER — Encounter: Payer: Self-pay | Admitting: Nurse Practitioner

## 2021-05-30 DIAGNOSIS — E348 Other specified endocrine disorders: Secondary | ICD-10-CM | POA: Diagnosis not present

## 2021-05-30 DIAGNOSIS — Z0000 Encounter for general adult medical examination without abnormal findings: Secondary | ICD-10-CM | POA: Diagnosis not present

## 2021-05-30 NOTE — Progress Notes (Signed)
This service is provided via telemedicine  No vital signs collected/recorded due to the encounter was a telemedicine visit.   Location of patient (ex: home, work):  Home  Patient consents to a telephone visit:  Yes, see encounter dated 05/29/2021  Location of the provider (ex: office, home):  [place]  Name of any referring provider:  N/A  Names of all persons participating in the telemedicine service and their role in the encounter:  Sherrie Mustache, Nurse Practitioner, Carroll Kinds, CMA, and patient.   Time spent on call:  11 minutes with medical assistant

## 2021-05-30 NOTE — Patient Instructions (Signed)
[name] , Thank you for taking time to come for your Medicare Wellness Visit. I appreciate your ongoing commitment to your health goals. Please review the following plan we discussed and let me know if I can assist you in the future.   Screening recommendations/referrals: Colonoscopy aged out Mammogram will get to get records Bone Density to call [phone] to schedule Recommended yearly ophthalmology/optometry visit for glaucoma screening and checkup Recommended yearly dental visit for hygiene and checkup  Vaccinations: Influenza vaccine up to date Pneumococcal vaccine up to date Tdap vaccine up to date Shingles vaccine to get 2nd vaccine at pharmacy    Advanced directives: on file.   Conditions/risks identified: advance age, memory loss, pain  Next appointment: yearly for AWV   Preventive Care 28 Years and Older, Female Preventive care refers to lifestyle choices and visits with your health care provider that can promote health and wellness. What does preventive care include? A yearly physical exam. This is also called an annual well check. Dental exams once or twice a year. Routine eye exams. Ask your health care provider how often you should have your eyes checked. Personal lifestyle choices, including: Daily care of your teeth and gums. Regular physical activity. Eating a healthy diet. Avoiding tobacco and drug use. Limiting alcohol use. Practicing safe sex. Taking low-dose aspirin every day. Taking vitamin and mineral supplements as recommended by your health care provider. What happens during an annual well check? The services and screenings done by your health care provider during your annual well check will depend on your age, overall health, lifestyle risk factors, and family history of disease. Counseling  Your health care provider may ask you questions about your: Alcohol use. Tobacco use. Drug use. Emotional well-being. Home and relationship  well-being. Sexual activity. Eating habits. History of falls. Memory and ability to understand (cognition). Work and work Statistician. Reproductive health. Screening  You may have the following tests or measurements: Height, weight, and BMI. Blood pressure. Lipid and cholesterol levels. These may be checked every 5 years, or more frequently if you are over 13 years old. Skin check. Lung cancer screening. You may have this screening every year starting at age 27 if you have a 30-pack-year history of smoking and currently smoke or have quit within the past 15 years. Fecal occult blood test (FOBT) of the stool. You may have this test every year starting at age 25. Flexible sigmoidoscopy or colonoscopy. You may have a sigmoidoscopy every 5 years or a colonoscopy every 10 years starting at age 79. Hepatitis C blood test. Hepatitis B blood test. Sexually transmitted disease (STD) testing. Diabetes screening. This is done by checking your blood sugar (glucose) after you have not eaten for a while (fasting). You may have this done every 1-3 years. Bone density scan. This is done to screen for osteoporosis. You may have this done starting at age 59. Mammogram. This may be done every 1-2 years. Talk to your health care provider about how often you should have regular mammograms. Talk with your health care provider about your test results, treatment options, and if necessary, the need for more tests. Vaccines  Your health care provider may recommend certain vaccines, such as: Influenza vaccine. This is recommended every year. Tetanus, diphtheria, and acellular pertussis (Tdap, Td) vaccine. You may need a Td booster every 10 years. Zoster vaccine. You may need this after age 94. Pneumococcal 13-valent conjugate (PCV13) vaccine. One dose is recommended after age 55. Pneumococcal polysaccharide (PPSV23) vaccine. One dose  is recommended after age 28. Talk to your health care provider about which  screenings and vaccines you need and how often you need them. This information is not intended to replace advice given to you by your health care provider. Make sure you discuss any questions you have with your health care provider. Document Released: 04/22/2015 Document Revised: 12/14/2015 Document Reviewed: 01/25/2015 Elsevier Interactive Patient Education  2017 [employer] Prevention in the Home Falls can cause injuries. They can happen to people of all ages. There are many things you can do to make your home safe and to help prevent falls. What can I do on the outside of my home? Regularly fix the edges of walkways and driveways and fix any cracks. Remove anything that might make you trip as you walk through a door, such as a raised step or threshold. Trim any bushes or trees on the path to your home. Use bright outdoor lighting. Clear any walking paths of anything that might make someone trip, such as rocks or tools. Regularly check to see if handrails are loose or broken. Make sure that both sides of any steps have handrails. Any raised decks and porches should have guardrails on the edges. Have any leaves, snow, or ice cleared regularly. Use sand or salt on walking paths during winter. Clean up any spills in your garage right away. This includes oil or grease spills. What can I do in the bathroom? Use night lights. Install grab bars by the toilet and in the tub and shower. Do not use towel bars as grab bars. Use non-skid mats or decals in the tub or shower. If you need to sit down in the shower, use a plastic, non-slip stool. Keep the floor dry. Clean up any water that spills on the floor as soon as it happens. Remove soap buildup in the tub or shower regularly. Attach bath mats securely with double-sided non-slip rug tape. Do not have throw rugs and other things on the floor that can make you trip. What can I do in the bedroom? Use night lights. Make sure that you have a  light by your bed that is easy to reach. Do not use any sheets or blankets that are too big for your bed. They should not hang down onto the floor. Have a firm chair that has side arms. You can use this for support while you get dressed. Do not have throw rugs and other things on the floor that can make you trip. What can I do in the kitchen? Clean up any spills right away. Avoid walking on wet floors. Keep items that you use a lot in easy-to-reach places. If you need to reach something above you, use a strong step stool that has a grab bar. Keep electrical cords out of the way. Do not use floor polish or wax that makes floors slippery. If you must use wax, use non-skid floor wax. Do not have throw rugs and other things on the floor that can make you trip. What can I do with my stairs? Do not leave any items on the stairs. Make sure that there are handrails on both sides of the stairs and use them. Fix handrails that are broken or loose. Make sure that handrails are as long as the stairways. Check any carpeting to make sure that it is firmly attached to the stairs. Fix any carpet that is loose or worn. Avoid having throw rugs at the top or bottom of the stairs.  If you do have throw rugs, attach them to the floor with carpet tape. Make sure that you have a light switch at the top of the stairs and the bottom of the stairs. If you do not have them, ask someone to add them for you. What else can I do to help prevent falls? Wear shoes that: Do not have high heels. Have rubber bottoms. Are comfortable and fit you well. Are closed at the toe. Do not wear sandals. If you use a stepladder: Make sure that it is fully opened. Do not climb a closed stepladder. Make sure that both sides of the stepladder are locked into place. Ask someone to hold it for you, if possible. Clearly mark and make sure that you can see: Any grab bars or handrails. First and last steps. Where the edge of each step  is. Use tools that help you move around (mobility aids) if they are needed. These include: Canes. Walkers. Scooters. Crutches. Turn on the lights when you go into a dark area. Replace any light bulbs as soon as they burn out. Set up your furniture so you have a clear path. Avoid moving your furniture around. If any of your floors are uneven, fix them. If there are any pets around you, be aware of where they are. Review your medicines with your doctor. Some medicines can make you feel dizzy. This can increase your chance of falling. Ask your doctor what other things that you can do to help prevent falls. This information is not intended to replace advice given to you by your health care provider. Make sure you discuss any questions you have with your health care provider. Document Released: 01/20/2009 Document Revised: 09/01/2015 Document Reviewed: 04/30/2014 Elsevier Interactive Patient Education  2017 [employer].

## 2021-05-30 NOTE — Progress Notes (Signed)
Subjective:   Daijha Leggio is a 88 y.o. female who presents for Medicare Annual (Subsequent) preventive examination.  Review of Systems           Objective:    There were no vitals filed for this visit. There is no height or weight on file to calculate BMI.  Advanced Directives 05/30/2021 05/23/2021 05/15/2021 01/17/2021 11/14/2020 09/28/2020 09/18/2020  Does Patient Have a Medical Advance Directive? Yes Yes Yes Yes Yes Yes No  Type of Advance Directive Living will;Out of facility DNR (pink MOST or yellow form) Living will;Out of facility DNR (pink MOST or yellow form) Living will;Out of facility DNR (pink MOST or yellow form) Out of facility DNR (pink MOST or yellow form);Living will Living will;Out of facility DNR (pink MOST or yellow form) Living will;Out of facility DNR (pink MOST or yellow form) -  Does patient want to make changes to medical advance directive? No - Patient declined No - Patient declined No - Patient declined No - Patient declined No - Patient declined No - Patient declined -  Copy of [place] in [place]  Would patient like information on creating a medical advance directive? - - - - - - No - Patient declined  Pre-existing out of facility DNR order (yellow form or pink MOST form) Yellow form placed in chart (order not valid for inpatient use) - Yellow form placed in chart (order not valid for inpatient use) Yellow form placed in chart (order not valid for inpatient use) - - -    Current Medications (verified) Outpatient Encounter Medications as of 05/30/2021  Medication Sig   Oxcarbazepine (TRILEPTAL) 300 MG tablet Take 300 mg by mouth daily.   acetaminophen (TYLENOL) 500 MG tablet Take 500 mg by mouth in the morning and at bedtime. 500 mg in the morning and afternoon   acetaminophen (TYLENOL) 650 MG CR tablet Take 650 mg by mouth at bedtime.   albuterol (VENTOLIN HFA) 108 (90 Base) MCG/ACT inhaler Inhale 2 puffs into the lungs  every 6 (six) hours as needed for wheezing or shortness of breath.   amLODipine (NORVASC) 5 MG tablet Take 0.5 tablets (2.5 mg total) by mouth daily.   ascorbic acid (VITAMIN C) 500 MG tablet Take 500 mg by mouth daily.   buprenorphine-naloxone (SUBOXONE) 2-0.5 mg SUBL SL tablet Place 1 tablet under the tongue daily.   buPROPion (WELLBUTRIN XL) 150 MG 24 hr tablet Take 1 tablet (150 mg total) by mouth daily.   carvedilol (COREG) 3.125 MG tablet TAKE 1 TABLET BY MOUTH  TWICE DAILY   Cholecalciferol (VITAMIN D3) 50 MCG (2000 UT) TABS Take 2,000 Units by mouth daily.    diclofenac Sodium (VOLTAREN) 1 % GEL Apply 4 g topically 4 (four) times daily.   DULoxetine (CYMBALTA) 30 MG capsule Take 30 mg by mouth daily.   fluticasone (FLONASE) 50 MCG/ACT nasal spray Place 2 sprays into both nostrils daily.   furosemide (LASIX) 20 MG tablet Take 2 tablets  (40 mg)  by mouth daily x 5 days then resume one tablet ( 20 mg tablet ) by mouth daily.   levothyroxine (SYNTHROID, LEVOTHROID) 100 MCG tablet Take 1 tablet (100 mcg total) by mouth daily before breakfast.   Lidocaine 4 % PTCH Apply 1 patch topically every 12 (twelve) hours. Apply to back   Magnesium 250 MG TABS Take 250 mg by mouth daily.   meclizine (ANTIVERT) 12.5 MG tablet Take 1 tablet (  12.5 mg total) by mouth 3 (three) times daily as needed for dizziness.   NONFORMULARY OR COMPOUNDED ITEM Apply 120 Tubes topically daily. Diclofenac/Cyclobenzaprine/lamotrigine/lidocaine/prilocaine (10480) 2%/2%/6%/5%/1.25% cream QTY: 120 GM SIG: (NEURO) APPLY 1-2 PUMPS (1-2 GMS) TO AFFECTED AREA (S) OR FOCAL POINTS 3 TO 4 TIMES DAILY. RUB IN FOR 2 MINUTES TO ACHIEVE MAX PENETRATION. [place] HANDS WELL.   Omega-3 Fatty Acids (FISH OIL PO) Take 1 capsule by mouth daily.   omeprazole (PRILOSEC) 40 MG capsule Take 1 capsule (40 mg total) by mouth daily.   polyethylene glycol powder (GLYCOLAX/MIRALAX) 17 GM/SCOOP powder Take 1 Container by mouth as needed.   potassium  chloride (KLOR-CON) 10 MEQ tablet TAKE 1 TABLET BY MOUTH  DAILY   pregabalin (LYRICA) 25 MG capsule Take 1 capsule (25 mg total) by mouth 2 (two) times daily.   senna-docusate (SENOKOT S) 8.6-50 MG tablet Take 2 tablets by mouth daily.   sertraline (ZOLOFT) 100 MG tablet Take 200 mg by mouth daily.    spironolactone (ALDACTONE) 25 MG tablet TAKE 1 TABLET BY MOUTH IN  THE MORNING   Vibegron (GEMTESA) 75 MG TABS Take 75 mg by mouth daily.   Vitamin E 400 units TABS Take 400 Units by mouth daily.    [DISCONTINUED] oxcarbazepine (TRILEPTAL) 600 MG tablet Take 1 tablet (600 mg total) by mouth at bedtime. (Patient taking differently: Take 300 mg by mouth at bedtime.)   No facility-administered encounter medications on file as of 05/30/2021.    Allergies (verified) Iodine, Shellfish allergy, Memantine, Aspirin, and Codeine   History: Past Medical History:  Diagnosis Date   Arthritis    Cancer ([place])    left breast cancer    Cataracts, bilateral    removed by surgery   CHF (congestive heart failure) ([place])    PACEMAKER & DEFIB   Complication of anesthesia    hypotensive after back surgery in 2006   Depression    Dyslipidemia    Fainted 04/21/06   AT [facility]   GERD (gastroesophageal reflux disease)    Headache(784.0)    Hearing loss    bilateral hearing aids   HLD (hyperlipidemia)    diet controlled    Hypertension    Hypothyroidism    ICD (implantable cardiac defibrillator) in place    pt has pacer/icd   ICD (implantable cardiac defibrillator), biventricular, in situ    LBBB (left bundle branch block)    Memory loss    Nonischemic cardiomyopathy ([place])    Normal coronary arteries    s/p cardiac cath 2007   Pacemaker    ICD [employer]   Syncope    Systolic CHF [facility])    Vertigo    Wears glasses    Past Surgical History:  Procedure Laterality Date   BACK SURGERY     lumbar fusion    BREAST LUMPECTOMY Left 2008   BREAST SURGERY  2000   LUMP REMOVAL. STAGE 1 CANCER    CARDIAC CATHETERIZATION     CATARACT EXTRACTION     COLONOSCOPY     EYE SURGERY     IMPLANTABLE CARDIOVERTER DEFIBRILLATOR GENERATOR CHANGE N/A 12/18/2012   Procedure: IMPLANTABLE CARDIOVERTER DEFIBRILLATOR GENERATOR CHANGE;  Surgeon: [name], MD;  Location: [facility] C[facility] LAB;  Service: Cardiovascular;  Laterality: N/A;   JOINT REPLACEMENT  06/14/01   right   LUMBAR FUSION  2006   MASS EXCISION  11/08/2011   Procedure: EXCISION MASS;  Surgeon: [name], MD;  Location: [facility] ORS;  Service: General;  Laterality: Left;  Excision Left Thigh Mass   MASTECTOMY W/ SENTINEL NODE BIOPSY Left 06/04/2019   Procedure: LEFT MASTECTOMY WITH SENTINEL LYMPH NODE BIOPSY;  Surgeon: [name], MD;  Location: [facility];  Service: General;  Laterality: Left;   MASTECTOMY, PARTIAL  2008   GOT PACEMAKER AND DEFIB AT THAT TIME   PACEMAKER INSERTION  04/23/06   TOTAL KNEE ARTHROPLASTY  05/17/01   RIGHT KNEE   TOTAL KNEE ARTHROPLASTY Left 11/29/2014   Procedure: TOTAL LEFT KNEE ARTHROPLASTY;  Surgeon: Paralee Cancel, MD;  Location: [facility] ORS;  Service: Orthopedics;  Laterality: Left;   Family History  Problem Relation Age of Onset   Hypertension Mother    Arthritis Mother    Hypertension Father    Hypertension Brother    Hypertension Brother    Social History   Socioeconomic History   Marital status: Married    Spouse name: Not on file   Number of children: 1   Years of education: Masters   Highest education level: Not on file  Occupational History   Occupation: Retired  Tobacco Use   Smoking status: Never   Smokeless tobacco: Never  Scientific laboratory technician Use: Never used  Substance and Sexual Activity   Alcohol use: No   Drug use: No   Sexual activity: Not Currently    Birth control/protection: Post-menopausal  Other Topics Concern   Not on file  Social History Narrative   Lives at home with husband.   Right-handed.      As of 07/28/2014   Diet: No special diet   Caffeine: yes, Chocolate, tea and  sodas    Married: YES, 1970   House: Yes, 2 stories, 2-3 persons live in home   Pets: No   Current/Past profession: Engineer, mining, Designer, jewellery    Exercise: Yes 2-3 x weekly   Living Will: Yes   DNR: No   POA/HPOA: No      Social Determinants of Radio broadcast assistant Strain: Not on file  Food Insecurity: Not on file  Transportation Needs: Not on file  Physical Activity: Not on file  Stress: Not on file  Social Connections: Not on file    Tobacco Counseling Counseling given: Not Answered   Clinical Intake:                 Diabetic?no         Activities of Daily Living No flowsheet data found.  Patient Care Team: [name], NP as PCP - General (Geriatric Medicine) [name], MD as Consulting Physician (General Surgery) Paralee Cancel, MD as Consulting Physician (Orthopedic Surgery) Marica Otter, [place] (Optometry) Marcial Pacas, MD as Consulting Physician (Neurology) [name], MD as Consulting Physician (Cardiology) Ricard Dillon, MD (Psychiatry)  Indicate any recent Medical Services you may have received from other than [facility] providers in the past year (date may be approximate).     Assessment:   This is a routine wellness examination for [place].  Hearing/Vision screen Hearing Screening - Comments:: Patient wears hearing aids. Vision Screening - Comments:: Patient wears reading glasses. Patient had eye exam last year. Patient sees Dr. Katy Fitch.  Dietary issues and exercise activities discussed:     Goals Addressed   None    Depression Screen PHQ 2/9 Scores 05/30/2021 05/23/2021 03/20/2021 07/15/2020 02/15/2020 02/04/2020 11/06/2019  PHQ - 2 Score - - 4 - - 0 4  PHQ- 9 Score - - 13 - - - 11  Exception Documentation  Other- indicate reason in comment box Other- indicate reason in comment box - Other- indicate reason in comment box Other- indicate reason in comment box - -  Not completed Patient has therapist she sees  Patient said she sees someone - Already seeing counslor - - -    Fall Risk Fall Risk  05/30/2021 05/23/2021 05/15/2021 05/05/2021 03/20/2021  Falls in the past year? 0 0 0 0 0  Number falls in past yr: 0 0 0 0 0  Injury with Fall? 0 0 0 0 0  Comment - - - - -  Risk for fall due to : No Fall Risks No Fall Risks No Fall Risks - -  Follow up Falls evaluation completed Falls evaluation completed Falls evaluation completed - -    FALL RISK PREVENTION PERTAINING TO THE HOME:  Any stairs in or around the home? Yes  If so, are there any without handrails? No  Home free of loose throw rugs in walkways, pet beds, electrical cords, etc? Yes  Adequate lighting in your home to reduce risk of falls? Yes   ASSISTIVE DEVICES UTILIZED TO PREVENT FALLS:  Life alert? No  Use of a cane, walker or w/c? Yes  Grab bars in the bathroom? Yes  Shower chair or bench in shower? Yes  Elevated toilet seat or a handicapped toilet? Yes   TIMED UP AND GO:  Was the test performed? No .   Cognitive Function: MMSE - Mini Mental State Exam 01/28/2020 07/23/2019 01/21/2019 01/29/2018 07/24/2017  Not completed: - - (No Data) (No Data) -  Orientation to time 5 4 5 5 4   Orientation to Place 5 5 4 5 5   Registration 3 3 3 3 3   Attention/ Calculation 0 4 1 5 2   Recall 3 2 3 1 2   Language- name 2 objects 2 2 1 2 2   Language- repeat 0 1 1 1 1   Language- follow 3 step command 3 3 2 3 3   Language- follow 3 step command-comments - - she didnt fold the paper - -  Language- read & follow direction 1 1 1 1 1   Write a sentence 1 1 1 1 1   Copy design 1 1 1 1 1   Copy design-comments - 13 animals - - -  Total score 24 27 23 28 25    Montreal Cognitive Assessment  10/13/2020  Visuospatial/ Executive (0/5) 2  Naming (0/3) 3  Attention: Read list of digits (0/2) 2  Attention: Read list of letters (0/1) 1  Attention: Serial 7 subtraction starting at 100 (0/3) 2  Language: Repeat phrase (0/2) 1  Language : Fluency (0/1) 1   Abstraction (0/2) 1  Delayed Recall (0/5) 2  Orientation (0/6) 6  Total 21  Adjusted Score (based on education) 21   6CIT Screen 05/30/2021  What Year? 0 points  What month? 0 points  What time? 0 points  Count back from 20 0 points  Months in reverse 0 points  Repeat phrase 2 points  Total Score 2    Immunizations Immunization History  Administered Date(s) Administered   Fluad Quad(high Dose 65+) 01/28/2020, 01/17/2021   Influenza, High Dose Seasonal PF 01/16/2018, 12/08/2018   Influenza-Unspecified 12/08/2013, 01/10/2015, 12/09/2015, 01/12/2017, 01/17/2021   PFIZER(Purple Top)SARS-COV-2 Vaccination 05/23/2019, 06/15/2019, 12/23/2019, 08/30/2020   Pfizer Covid-19 Vaccine Bivalent Booster 37yrs & up 02/03/2021   Pneumococcal Conjugate-13 04/09/2012   Pneumococcal Polysaccharide-23 02/28/2016   Tdap 01/07/2013   Zoster Recombinat (Shingrix) 02/03/2021   Zoster, Live 08/05/2014  TDAP status: Up to date  Flu Vaccine status: Up to date  Pneumococcal vaccine status: Up to date  Covid-19 vaccine status: Completed vaccines  Qualifies for Shingles Vaccine? Yes   Zostavax completed No   Shingrix Completed?: No.    Education has been provided regarding the importance of this vaccine. Patient has been advised to call insurance company to determine out of pocket expense if they have not yet received this vaccine. Advised may also receive vaccine at local pharmacy or Health Dept. Verbalized acceptance and understanding.  Screening Tests Health Maintenance  Topic Date Due   MAMMOGRAM  03/18/2021   Zoster Vaccines- Shingrix (2 of 2) 03/31/2021   TETANUS/TDAP  01/08/2023   Pneumonia Vaccine 4+ Years old  Completed   INFLUENZA VACCINE  Completed   DEXA SCAN  Completed   COVID-19 Vaccine  Completed   HPV VACCINES  Aged Out    Health Maintenance  Health Maintenance Due  Topic Date Due   MAMMOGRAM  03/18/2021   Zoster Vaccines- Shingrix (2 of 2) 03/31/2021     Colorectal cancer screening: No longer required.   Mammogram status: No longer required due to age.  Bone Density status: Ordered today. Pt provided with contact info and advised to call to schedule appt.  Lung Cancer Screening: (Low Dose CT Chest recommended if Age 25-80 years, 30 pack-year currently smoking OR have quit w/in 15years.) does not qualify.   Lung Cancer Screening Referral: na  Additional Screening:  Hepatitis C Screening: does not qualify; Completed   Vision Screening: Recommended annual ophthalmology exams for early detection of glaucoma and other disorders of the eye. Is the patient up to date with their annual eye exam?  Yes  Who is the provider or what is the name of the office in which the patient attends annual eye exams? [facility] If pt is not established with a provider, would they like to be referred to a provider to establish care? No .   Dental Screening: Recommended annual dental exams for proper oral hygiene  Community Resource Referral / Chronic Care Management: CRR required this visit?  No   CCM required this visit?  No      Plan:     I have personally reviewed and noted the following in the patients chart:   Medical and social history Use of alcohol, tobacco or illicit drugs  Current medications and supplements including opioid prescriptions.  Functional ability and status Nutritional status Physical activity Advanced directives List of other physicians Hospitalizations, surgeries, and ER visits in previous 12 months Vitals Screenings to include cognitive, depression, and falls Referrals and appointments  In addition, I have reviewed and discussed with patient certain preventive protocols, quality metrics, and best practice recommendations. A written personalized care plan for preventive services as well as general preventive health recommendations were provided to patient.     [name], NP   05/30/2021    Virtual Visit  via Telephone Note  I connected with patient 05/30/21 at  3:45 PM EST by telephone and verified that I am speaking with the correct person using two identifiers.  Location: Patient: home Provider: [facility]   I discussed the limitations, risks, security and privacy concerns of performing an evaluation and management service by telephone and the availability of in person appointments. I also discussed with the patient that there may be a patient responsible charge related to this service. The patient expressed understanding and agreed to proceed.   I discussed the assessment and  treatment plan with the patient. The patient was provided an opportunity to ask questions and all were answered. The patient agreed with the plan and demonstrated an understanding of the instructions.   The patient was advised to call back or seek an in-person evaluation if the symptoms worsen or if the condition fails to improve as anticipated.  I provided 16 minutes of non-face-to-face time during this encounter.  Carlos American. Harle Battiest Avs printed and mailed

## 2021-05-30 NOTE — Telephone Encounter (Signed)
Ms. kiki, bivens are scheduled for a virtual visit with your provider today.    Just as we do with appointments in the office, we must obtain your consent to participate.  Your consent will be active for this visit and any virtual visit you may have with one of our providers in the next 365 days.    If you have a MyChart account, I can also send a copy of this consent to you electronically.  All virtual visits are billed to your insurance company just like a traditional visit in the office.  As this is a virtual visit, video technology does not allow for your provider to perform a traditional examination.  This may limit your provider's ability to fully assess your condition.  If your provider identifies any concerns that need to be evaluated in person or the need to arrange testing such as labs, EKG, etc, we will make arrangements to do so.    Although advances in technology are sophisticated, we cannot ensure that it will always work on either your end or our end.  If the connection with a video visit is poor, we may have to switch to a telephone visit.  With either a video or telephone visit, we are not always able to ensure that we have a secure connection.   I need to obtain your verbal consent now.   Are you willing to proceed with your visit today?   Kemiya [address] [place] has provided verbal consent on 05/30/2021 for a virtual visit (video or telephone).   Carroll Kinds, A [address] 05/30/2021  2:56 PM

## 2021-06-02 ENCOUNTER — Other Ambulatory Visit: Payer: Self-pay

## 2021-06-02 ENCOUNTER — Encounter: Payer: Self-pay | Admitting: Nurse Practitioner

## 2021-06-02 ENCOUNTER — Ambulatory Visit (INDEPENDENT_AMBULATORY_CARE_PROVIDER_SITE_OTHER): Payer: Medicare Other | Admitting: Nurse Practitioner

## 2021-06-02 VITALS — BP 114/72 | HR 71 | Temp 97.8°F | Ht 59.0 in | Wt 119.0 lb

## 2021-06-02 DIAGNOSIS — M25572 Pain in left ankle and joints of left foot: Secondary | ICD-10-CM | POA: Diagnosis not present

## 2021-06-02 DIAGNOSIS — R600 Localized edema: Secondary | ICD-10-CM | POA: Diagnosis not present

## 2021-06-02 DIAGNOSIS — I5022 Chronic systolic (congestive) heart failure: Secondary | ICD-10-CM

## 2021-06-02 LAB — COMPLETE METABOLIC PANEL WITH GFR
AG Ratio: 1.4 (calc) (ref 1.0–2.5)
ALT: 20 U/L (ref 6–29)
AST: 26 U/L (ref 10–35)
Albumin: 4.2 g/dL (ref 3.6–5.1)
Alkaline phosphatase (APISO): 69 U/L (ref 37–153)
BUN/Creatinine Ratio: 27 (calc) — ABNORMAL HIGH (ref 6–22)
BUN: 28 mg/dL — ABNORMAL HIGH (ref 7–25)
CO2: 32 mmol/L (ref 20–32)
Calcium: 11.1 mg/dL — ABNORMAL HIGH (ref 8.6–10.4)
Chloride: 100 mmol/L (ref 98–110)
Creat: 1.04 mg/dL — ABNORMAL HIGH (ref 0.60–0.95)
Globulin: 3 g/dL (calc) (ref 1.9–3.7)
Glucose, Bld: 103 mg/dL (ref 65–139)
Potassium: 4.9 mmol/L (ref 3.5–5.3)
Sodium: 138 mmol/L (ref 135–146)
Total Bilirubin: 0.4 mg/dL (ref 0.2–1.2)
Total Protein: 7.2 g/dL (ref 6.1–8.1)
eGFR: 53 mL/min/{1.73_m2} — ABNORMAL LOW (ref >=60)

## 2021-06-02 LAB — CBC WITH DIFFERENTIAL/PLATELET
Absolute Monocytes: 788 cells/uL (ref 200–950)
Basophils Absolute: 73 cells/uL (ref 0–200)
Basophils Relative: 1 %
Eosinophils Absolute: 241 cells/uL (ref 15–500)
Eosinophils Relative: 3.3 %
HCT: 39.5 % (ref 35.0–45.0)
Hemoglobin: 12.7 g/dL (ref 11.7–15.5)
Lymphs Abs: 1285 cells/uL (ref 850–3900)
MCH: 29.5 pg (ref 27.0–33.0)
MCHC: 32.2 g/dL (ref 32.0–36.0)
MCV: 91.6 fL (ref 80.0–100.0)
MPV: 12.2 fL (ref 7.5–12.5)
Monocytes Relative: 10.8 %
Neutro Abs: 4913 cells/uL (ref 1500–7800)
Neutrophils Relative %: 67.3 %
Platelets: 130 10*3/uL — ABNORMAL LOW (ref 140–400)
RBC: 4.31 10*6/uL (ref 3.80–5.10)
RDW: 13.9 % (ref 11.0–15.0)
Total Lymphocyte: 17.6 %
WBC: 7.3 10*3/uL (ref 3.8–10.8)

## 2021-06-02 LAB — TSH: TSH: 0.81 mIU/L (ref 0.40–4.50)

## 2021-06-02 LAB — URIC ACID: Uric Acid, Serum: 3.5 mg/dL (ref 2.5–7.0)

## 2021-06-02 MED ORDER — FUROSEMIDE 20 MG PO TABS: ORAL_TABLET | ORAL | 1 refills | Status: DC

## 2021-06-02 MED ORDER — PREDNISONE 10 MG (21) PO TBPK: ORAL_TABLET | ORAL | 0 refills | Status: DC

## 2021-06-02 NOTE — Patient Instructions (Signed)
Stop norvasc   Increase lasix to 40 mg daily

## 2021-06-02 NOTE — Progress Notes (Signed)
Careteam: Patient Care Team: [name], NP as PCP - General (Geriatric Medicine) [name], MD as Consulting Physician (General Surgery) Paralee Cancel, MD as Consulting Physician (Orthopedic Surgery) Marica Otter, [place] (Optometry) Marcial Pacas, MD as Consulting Physician (Neurology) [name], MD as Consulting Physician (Cardiology) Ricard Dillon, MD (Psychiatry)  PLACE OF SERVICE:  [place]  Advanced Directive information    Allergies  Allergen Reactions   Iodine Shortness Of Breath    Iodine contrast, CHF , SOB   Shellfish Allergy Shortness Of Breath   Memantine     Malaise, fogginess/ couldn't think   Aspirin Nausea And Vomiting   Codeine Nausea And Vomiting    Chief Complaint  Patient presents with   Follow-up    Blood pressure check and swelling in legs. Patient with weight gain and questions if related to weight gain. Compared b/p cuffs, patients cuff 120/65, pulse 70      HPI: Patient is a 88 y.o. female to follow up LE edema and blood pressure.   Has had LE edema has been going on for 2 weeks, improved with increase in lasix but now getting worse. Legs are tight. No redness or heat. Her Norvasc was decreased to 2.5 mg daily then lasix was increased.   She has been having pain in her ankle and toes on the left since swelling has worsened. No chest pains, shortness of breath.   Blood pressures at home are 120-130s/70s occasionally 140s   Review of Systems:  Review of Systems  Constitutional:  Negative for chills and fever.  Respiratory:  Negative for cough and shortness of breath.   Cardiovascular:  Positive for leg swelling. Negative for chest pain and palpitations.  Musculoskeletal:  Positive for joint pain. Negative for falls.  Skin:  Negative for itching and rash.  Neurological:  Negative for dizziness and headaches.   Past Medical History:  Diagnosis Date   Arthritis    Cancer ([place])    left breast cancer     Cataracts, bilateral    removed by surgery   CHF (congestive heart failure) ([place])    PACEMAKER & DEFIB   Complication of anesthesia    hypotensive after back surgery in 2006   Depression    Dyslipidemia    Fainted 04/21/06   AT [facility]   GERD (gastroesophageal reflux disease)    Headache(784.0)    Hearing loss    bilateral hearing aids   HLD (hyperlipidemia)    diet controlled    Hypertension    Hypothyroidism    ICD (implantable cardiac defibrillator) in place    pt has pacer/icd   ICD (implantable cardiac defibrillator), biventricular, in situ    LBBB (left bundle branch block)    Memory loss    Nonischemic cardiomyopathy ([place])    Normal coronary arteries    s/p cardiac cath 2007   Pacemaker    ICD [employer]   Syncope    Systolic CHF [facility])    Vertigo    Wears glasses    Past Surgical History:  Procedure Laterality Date   BACK SURGERY     lumbar fusion    BREAST LUMPECTOMY Left 2008   BREAST SURGERY  2000   LUMP REMOVAL. STAGE 1 CANCER   CARDIAC CATHETERIZATION     CATARACT EXTRACTION     COLONOSCOPY     EYE SURGERY     IMPLANTABLE CARDIOVERTER DEFIBRILLATOR GENERATOR CHANGE N/A 12/18/2012   Procedure: IMPLANTABLE CARDIOVERTER DEFIBRILLATOR GENERATOR CHANGE;  Surgeon: [name], MD;  Location: [facility] CATH LAB;  Service: Cardiovascular;  Laterality: N/A;   JOINT REPLACEMENT  06/14/01   right   LUMBAR FUSION  2006   MASS EXCISION  11/08/2011   Procedure: EXCISION MASS;  Surgeon: [name], MD;  Location: [facility];  Service: General;  Laterality: Left;  Excision Left Thigh Mass   MASTECTOMY W/ SENTINEL NODE BIOPSY Left 06/04/2019   Procedure: LEFT MASTECTOMY WITH SENTINEL LYMPH NODE BIOPSY;  Surgeon: [name], MD;  Location: [place];  Service: General;  Laterality: Left;   MASTECTOMY, PARTIAL  2008   GOT PACEMAKER AND DEFIB AT THAT TIME   PACEMAKER INSERTION  04/23/06   TOTAL KNEE ARTHROPLASTY  05/17/01   RIGHT KNEE   TOTAL KNEE ARTHROPLASTY Left 11/29/2014    Procedure: TOTAL LEFT KNEE ARTHROPLASTY;  Surgeon: Paralee Cancel, MD;  Location: [facility];  Service: Orthopedics;  Laterality: Left;   Social History:   reports that she has never smoked. She has never used smokeless tobacco. She reports that she does not drink alcohol and does not use drugs.  Family History  Problem Relation Age of Onset   Hypertension Mother    Arthritis Mother    Hypertension Father    Hypertension Brother    Hypertension Brother     Medications: Patient's Medications  New Prescriptions   No medications on file  Previous Medications   ACETAMINOPHEN (TYLENOL) 500 MG TABLET    Take 500 mg by mouth in the morning and at bedtime. 500 mg in the morning and afternoon   ACETAMINOPHEN (TYLENOL) 650 MG CR TABLET    Take 650 mg by mouth at bedtime.   ALBUTEROL (VENTOLIN HFA) 108 (90 BASE) MCG/ACT INHALER    Inhale 2 puffs into the lungs every 6 (six) hours as needed for wheezing or shortness of breath.   AMLODIPINE (NORVASC) 5 MG TABLET    Take 0.5 tablets (2.5 mg total) by mouth daily.   ASCORBIC ACID (VITAMIN C) 500 MG TABLET    Take 500 mg by mouth daily.   BUPROPION (WELLBUTRIN XL) 150 MG 24 HR TABLET    Take 1 tablet (150 mg total) by mouth daily.   CARVEDILOL (COREG) 3.125 MG TABLET    TAKE 1 TABLET BY MOUTH  TWICE DAILY   CHOLECALCIFEROL (VITAMIN D3) 50 MCG (2000 UT) TABS    Take 2,000 Units by mouth daily.    DICLOFENAC SODIUM (VOLTAREN) 1 % GEL    Apply 4 g topically 4 (four) times daily.   DULOXETINE (CYMBALTA) 30 MG CAPSULE    Take 30 mg by mouth daily.   FLUTICASONE (FLONASE) 50 MCG/ACT NASAL SPRAY    Place 2 sprays into both nostrils daily.   FUROSEMIDE (LASIX) 20 MG TABLET    Take 2 tablets  (40 mg)  by mouth daily x 5 days then resume one tablet ( 20 mg tablet ) by mouth daily.   LEVOTHYROXINE (SYNTHROID, LEVOTHROID) 100 MCG TABLET    Take 1 tablet (100 mcg total) by mouth daily before breakfast.   LIDOCAINE 4 % PTCH    Apply 1 patch topically every 12 (twelve)  hours. Apply to back   MAGNESIUM 250 MG TABS    Take 250 mg by mouth daily.   MECLIZINE (ANTIVERT) 12.5 MG TABLET    Take 1 tablet (12.5 mg total) by mouth 3 (three) times daily as needed for dizziness.   NONFORMULARY OR COMPOUNDED ITEM    Apply 120 Tubes topically  daily. Diclofenac/Cyclobenzaprine/lamotrigine/lidocaine/prilocaine (10480) 2%/2%/6%/5%/1.25% cream QTY: 120 GM SIG: (NEURO) APPLY 1-2 PUMPS (1-2 GMS) TO AFFECTED AREA (S) OR FOCAL POINTS 3 TO 4 TIMES DAILY. RUB IN FOR 2 MINUTES TO ACHIEVE MAX PENETRATION. [place] HANDS WELL.   OMEGA-3 FATTY ACIDS (FISH OIL PO)    Take 1 capsule by mouth daily.   OMEPRAZOLE (PRILOSEC) 40 MG CAPSULE    Take 1 capsule (40 mg total) by mouth daily.   OXCARBAZEPINE (TRILEPTAL) 300 MG TABLET    Take 300 mg by mouth daily.   POLYETHYLENE GLYCOL POWDER (GLYCOLAX/MIRALAX) 17 GM/SCOOP POWDER    Take 1 Container by mouth as needed.   POTASSIUM CHLORIDE (KLOR-CON) 10 MEQ TABLET    TAKE 1 TABLET BY MOUTH  DAILY   PREGABALIN (LYRICA) 25 MG CAPSULE    Take 1 capsule (25 mg total) by mouth 2 (two) times daily.   SENNA-DOCUSATE (SENOKOT S) 8.6-50 MG TABLET    Take 2 tablets by mouth daily.   SERTRALINE (ZOLOFT) 100 MG TABLET    Take 200 mg by mouth daily.    SPIRONOLACTONE (ALDACTONE) 25 MG TABLET    TAKE 1 TABLET BY MOUTH IN  THE MORNING   VIBEGRON (GEMTESA) 75 MG TABS    Take 75 mg by mouth daily.   VITAMIN E 400 UNITS TABS    Take 400 Units by mouth daily.   Modified Medications   No medications on file  Discontinued Medications   BUPRENORPHINE-NALOXONE (SUBOXONE) 2-0.5 MG SUBL SL TABLET    Place 1 tablet under the tongue daily.    Physical Exam:  Vitals:   06/02/21 0802  BP: 114/72  Pulse: 71  Temp: 97.8 F (36.6 C)  TempSrc: Temporal  SpO2: 97%  Weight: 119 lb (54 kg)  Height: _0  (1.499 m)   Body mass index is 24.04 kg/m. Wt Readings from Last 3 Encounters:  06/02/21 119 lb (54 kg)  05/23/21 122 lb (55.3 kg)  05/15/21 123 lb (55.8 kg)     Physical Exam Constitutional:      Appearance: Normal appearance.  Cardiovascular:     Rate and Rhythm: Normal rate and regular rhythm.  Pulmonary:     Effort: Pulmonary effort is normal.     Breath sounds: Normal breath sounds.  Musculoskeletal:     Right lower leg: Edema (1+ pitting edema) present.     Left lower leg: Edema (1+ pitting) present.     Right ankle: Normal.     Left ankle: Tenderness present over the lateral malleolus. Decreased range of motion.     Left foot: Tenderness present.  Skin:    General: Skin is warm and dry.  Neurological:     Mental Status: She is alert and oriented to person, place, and time. Mental status is at baseline.  Psychiatric:        Mood and Affect: Mood normal.    Labs reviewed: Basic Metabolic Panel: Recent Labs    11/17/20 0808 12/21/20 1009 05/11/21 0838  NA 139 141 140  K 4.4 4.5 4.2  CL 104 104 102  CO2 _1 GLUCOSE 95 107* 89  BUN _2 CREATININE 0.70 0.85 0.81  CALCIUM 9.8 10.6* 10.4  TSH 0.67  --   --    Liver Function Tests: Recent Labs    06/20/20 0944 09/18/20 1210 12/21/20 1009  AST _3 ALT _4 ALKPHOS 68 48 53  BILITOT 0.3 0.4 0.3  PROT 7.8  7.1 7.3  ALBUMIN 4.0 4.0 3.9   No results for input(s): LIPASE, AMYLASE in the last 8760 hours. No results for input(s): AMMONIA in the last 8760 hours. CBC: Recent Labs    11/17/20 0808 12/21/20 1009 05/11/21 0838  WBC 5.7 8.8 7.1  NEUTROABS 3,500 6.2 4,750  HGB 12.7 12.8 12.4  HCT 39.6 37.9 37.4  MCV 89.0 87.3 89.0  PLT 128* 141* 122*   Lipid Panel: Recent Labs    11/17/20 0808  CHOL 171  HDL 63  LDLCALC 90  TRIG 86  CHOLHDL 2.7   TSH: Recent Labs    11/17/20 0808  TSH 0.67   A1C: No results found for: HGBA1C   Assessment/Plan 1. Acute left ankle pain ?gout vs arthritis flare. Will get blood work and treat with prednisone taper due to increase in pain - Uric Acid - CMP with eGFR([facility]) - CBC with  Differential/Platelet - predniSONE (STERAPRED UNI-PAK 21 TAB) 10 MG (21) TBPK tablet; Use as directed  Dispense: 21 tablet; Refill: 0  2. Bilateral leg edema -worsening with decrease in lasix, will have her increase to 40 mg daiily at this time - TSH - furosemide (LASIX) 20 MG tablet; Take 2 tablets  (40 mg)  by mouth daily) by mouth daily.  Dispense: 60 tablet; Refill: 1  3. Chronic systolic heart failure (HCC) Stable, no signs of worsening CHF - furosemide (LASIX) 20 MG tablet; Take 2 tablets  (40 mg)  by mouth daily by mouth daily.  Dispense: 60 tablet; Refill: 1  4. HTN Will have her stop amlodipine as this can be contributing to LE edema. Continue to monitor BP at home. After medication  Follow up in 1 week on swelling, pain and BP [name] K. [place], [facility] [phone]

## 2021-06-06 ENCOUNTER — Other Ambulatory Visit: Payer: Self-pay | Admitting: Nurse Practitioner

## 2021-06-07 NOTE — Telephone Encounter (Signed)
High risk or very high risk warning populated when attempting to refill medication. RX request sent to PCP for review and approval if warranted.   

## 2021-06-09 ENCOUNTER — Encounter: Payer: Self-pay | Admitting: Nurse Practitioner

## 2021-06-09 ENCOUNTER — Other Ambulatory Visit: Payer: Self-pay

## 2021-06-09 ENCOUNTER — Ambulatory Visit (INDEPENDENT_AMBULATORY_CARE_PROVIDER_SITE_OTHER): Payer: Medicare Other | Admitting: Nurse Practitioner

## 2021-06-09 VITALS — BP 150/82 | HR 74 | Temp 97.1°F | Ht 59.0 in

## 2021-06-09 DIAGNOSIS — I10 Essential (primary) hypertension: Secondary | ICD-10-CM

## 2021-06-09 DIAGNOSIS — R600 Localized edema: Secondary | ICD-10-CM

## 2021-06-09 DIAGNOSIS — M25572 Pain in left ankle and joints of left foot: Secondary | ICD-10-CM

## 2021-06-09 MED ORDER — LISINOPRIL 5 MG PO TABS: 5.0000 mg | ORAL_TABLET | Freq: Every day | ORAL | 1 refills | Status: DC

## 2021-06-09 NOTE — Patient Instructions (Signed)
To stop potassium supplement at this time.  ? ?Start lisinopril 5 mg daily ? ?Follow up lab in 7-10 days  ?

## 2021-06-09 NOTE — Progress Notes (Signed)
Careteam: Patient Care Team: [name], NP as PCP - General (Geriatric Medicine) Almond Lint, MD as Consulting Physician (General Surgery) Durene Romans, MD as Consulting Physician (Orthopedic Surgery) Blima Ledger, OD (Optometry) Levert Feinstein, MD as Consulting Physician (Neurology) Marinus Maw, MD as Consulting Physician (Cardiology) Marcellina Millin, MD (Psychiatry)  PLACE OF SERVICE:  [name]  Advanced Directive information    Allergies  Allergen Reactions   Iodine Shortness Of Breath    Iodine contrast, CHF , SOB   Shellfish Allergy Shortness Of Breath   Memantine     Malaise, fogginess/ couldn't think   Aspirin Nausea And Vomiting   Codeine Nausea And Vomiting    Chief Complaint  Patient presents with   Follow-up    Follow up on blood pressure. Patient has been keeping track of blood pressure. Patient was told to stop amlodipine.Patient states that leg swelling is better,but has had pain in side of foot that woke her up last night.     HPI: Patient is a 88 y.o. female for follow up on swelling and foot pain.  Last week stop norvasc to see if this would help with LE edema- swelling better.  She is concerned about her blood pressure going up. She is now only taking coreg, lasix and spirolactone.   She still has pain to left ankle. Got better with prednisone but did not completely resolve. Then last night pain was 10/10. She took tylenol but this did not help.     Review of Systems:  Review of Systems  Constitutional:  Positive for weight loss. Negative for chills and fever.  HENT:  Negative for tinnitus.   Respiratory:  Negative for cough, sputum production and shortness of breath.   Cardiovascular:  Negative for chest pain, palpitations and leg swelling (resolved).  Gastrointestinal:  Negative for abdominal pain, constipation, diarrhea and heartburn.  Genitourinary:  Negative for dysuria, frequency and urgency.  Musculoskeletal:   Positive for joint pain. Negative for back pain, falls and myalgias.  Skin: Negative.   Neurological:  Negative for dizziness and headaches.  Psychiatric/Behavioral:  Negative for depression and memory loss. The patient does not have insomnia.    Past Medical History:  Diagnosis Date   Arthritis    Cancer (HCC)    left breast cancer    Cataracts, bilateral    removed by surgery   CHF (congestive heart failure) (HCC)    PACEMAKER & DEFIB   Complication of anesthesia    hypotensive after back surgery in 2006   Depression    Dyslipidemia    Fainted 04/21/06   AT [facility]   GERD (gastroesophageal reflux disease)    Headache(784.0)    Hearing loss    bilateral hearing aids   HLD (hyperlipidemia)    diet controlled    Hypertension    Hypothyroidism    ICD (implantable cardiac defibrillator) in place    pt has pacer/icd   ICD (implantable cardiac defibrillator), biventricular, in situ    LBBB (left bundle branch block)    Memory loss    Nonischemic cardiomyopathy (HCC)    Normal coronary arteries    s/p cardiac cath 2007   Pacemaker    ICD [facility] [employer]   Syncope    Systolic CHF [facility])    Vertigo    Wears glasses    Past Surgical History:  Procedure Laterality Date   BACK SURGERY     lumbar fusion    BREAST LUMPECTOMY Left 2008  BREAST SURGERY  2000   LUMP REMOVAL. STAGE 1 CANCER   CARDIAC CATHETERIZATION     CATARACT EXTRACTION     COLONOSCOPY     EYE SURGERY     IMPLANTABLE CARDIOVERTER DEFIBRILLATOR GENERATOR CHANGE N/A 12/18/2012   Procedure: IMPLANTABLE CARDIOVERTER DEFIBRILLATOR GENERATOR CHANGE;  Surgeon: Marinus Maw, MD;  Location: [facility] CATH LAB;  Service: Cardiovascular;  Laterality: N/A;   JOINT REPLACEMENT  06/14/01   right   LUMBAR FUSION  2006   MASS EXCISION  11/08/2011   Procedure: EXCISION MASS;  Surgeon: Almond Lint, MD;  Location: [facility];  Service: General;  Laterality: Left;  Excision Left Thigh Mass   MASTECTOMY W/ SENTINEL NODE BIOPSY Left  06/04/2019   Procedure: LEFT MASTECTOMY WITH SENTINEL LYMPH NODE BIOPSY;  Surgeon: Almond Lint, MD;  Location: [facility];  Service: General;  Laterality: Left;   MASTECTOMY, PARTIAL  2008   GOT PACEMAKER AND DEFIB AT THAT TIME   PACEMAKER INSERTION  04/23/06   TOTAL KNEE ARTHROPLASTY  05/17/01   RIGHT KNEE   TOTAL KNEE ARTHROPLASTY Left 11/29/2014   Procedure: TOTAL LEFT KNEE ARTHROPLASTY;  Surgeon: Durene Romans, MD;  Location: [facility];  Service: Orthopedics;  Laterality: Left;   Social History:   reports that she has never smoked. She has never used smokeless tobacco. She reports that she does not drink alcohol and does not use drugs.  Family History  Problem Relation Age of Onset   Hypertension Mother    Arthritis Mother    Hypertension Father    Hypertension Brother    Hypertension Brother     Medications: Patient's Medications  New Prescriptions   No medications on file  Previous Medications   ACETAMINOPHEN (TYLENOL) 500 MG TABLET    Take 500 mg by mouth in the morning and at bedtime. 500 mg in the morning and afternoon   ACETAMINOPHEN (TYLENOL) 650 MG CR TABLET    Take 650 mg by mouth at bedtime.   ALBUTEROL (VENTOLIN HFA) 108 (90 BASE) MCG/ACT INHALER    Inhale 2 puffs into the lungs every 6 (six) hours as needed for wheezing or shortness of breath.   ASCORBIC ACID (VITAMIN C) 500 MG TABLET    Take 500 mg by mouth daily.   BUPROPION (WELLBUTRIN XL) 150 MG 24 HR TABLET    Take 1 tablet (150 mg total) by mouth daily.   CARVEDILOL (COREG) 3.125 MG TABLET    TAKE 1 TABLET BY MOUTH  TWICE DAILY   CHOLECALCIFEROL (VITAMIN D3) 50 MCG (2000 UT) TABS    Take 2,000 Units by mouth daily.    DICLOFENAC SODIUM (VOLTAREN) 1 % GEL    Apply 4 g topically 4 (four) times daily.   DULOXETINE (CYMBALTA) 30 MG CAPSULE    Take 30 mg by mouth daily.   FAMOTIDINE (PEPCID) 10 MG TABLET    Take 10 mg by mouth as needed for heartburn or indigestion.   FLUTICASONE (FLONASE) 50 MCG/ACT NASAL SPRAY    Place 2  sprays into both nostrils daily.   FUROSEMIDE (LASIX) 20 MG TABLET    TAKE 1 TABLET BY MOUTH  DAILY   LEVOTHYROXINE (SYNTHROID, LEVOTHROID) 100 MCG TABLET    Take 1 tablet (100 mcg total) by mouth daily before breakfast.   LIDOCAINE 4 % PTCH    Apply 1 patch topically every 12 (twelve) hours. Apply to back   MAGNESIUM 250 MG TABS    Take 250 mg by mouth daily.   MECLIZINE (  ANTIVERT) 12.5 MG TABLET    Take 1 tablet (12.5 mg total) by mouth 3 (three) times daily as needed for dizziness.   NONFORMULARY OR COMPOUNDED ITEM    Apply 120 Tubes topically daily. Diclofenac/Cyclobenzaprine/lamotrigine/lidocaine/prilocaine (62130) 2%/2%/6%/5%/1.25% cream QTY: 120 GM SIG: (NEURO) APPLY 1-2 PUMPS (1-2 GMS) TO AFFECTED AREA (S) OR FOCAL POINTS 3 TO 4 TIMES DAILY. RUB IN FOR 2 MINUTES TO ACHIEVE MAX PENETRATION. [place] HANDS WELL.   OMEGA-3 FATTY ACIDS (FISH OIL PO)    Take 1 capsule by mouth daily.   OMEPRAZOLE (PRILOSEC) 40 MG CAPSULE    Take 1 capsule (40 mg total) by mouth daily.   OXCARBAZEPINE (TRILEPTAL) 300 MG TABLET    Take 300 mg by mouth daily.   POLYETHYLENE GLYCOL POWDER (GLYCOLAX/MIRALAX) 17 GM/SCOOP POWDER    Take 1 Container by mouth as needed.   POTASSIUM CHLORIDE (KLOR-CON M) 10 MEQ TABLET    TAKE 1 TABLET BY MOUTH  DAILY   PREGABALIN (LYRICA) 25 MG CAPSULE    Take 1 capsule (25 mg total) by mouth 2 (two) times daily.   SENNA-DOCUSATE (SENOKOT S) 8.6-50 MG TABLET    Take 2 tablets by mouth daily.   SERTRALINE (ZOLOFT) 100 MG TABLET    Take 200 mg by mouth daily.    SPIRONOLACTONE (ALDACTONE) 25 MG TABLET    TAKE 1 TABLET BY MOUTH IN  THE MORNING   VIBEGRON (GEMTESA) 75 MG TABS    Take 75 mg by mouth daily.   VITAMIN E 400 UNITS TABS    Take 400 Units by mouth daily.   Modified Medications   No medications on file  Discontinued Medications   PREDNISONE (STERAPRED UNI-PAK 21 TAB) 10 MG (21) TBPK TABLET    Use as directed    Physical Exam:  Vitals:   06/09/21 1544  BP: (!) 150/82  Pulse:  74  Temp: (!) 97.1 F (36.2 C)  SpO2: 96%  Height: 4\' 11"  (1.499 m)   Body mass index is 24.04 kg/m. Wt Readings from Last 3 Encounters:  06/02/21 119 lb (54 kg)  05/23/21 122 lb (55.3 kg)  05/15/21 123 lb (55.8 kg)    Physical Exam Constitutional:      General: She is not in acute distress.    Appearance: She is well-developed. She is not diaphoretic.  HENT:     Head: Normocephalic and atraumatic.     Mouth/Throat:     Pharynx: No oropharyngeal exudate.  Eyes:     Conjunctiva/sclera: Conjunctivae normal.     Pupils: Pupils are equal, round, and reactive to light.  Cardiovascular:     Rate and Rhythm: Normal rate and regular rhythm.     Heart sounds: Normal heart sounds.  Pulmonary:     Effort: Pulmonary effort is normal.     Breath sounds: Normal breath sounds.  Abdominal:     General: Bowel sounds are normal.     Palpations: Abdomen is soft.  Musculoskeletal:     Cervical back: Normal range of motion and neck supple.     Right lower leg: No edema.     Left lower leg: No edema.     Left ankle: Tenderness present. Normal range of motion.  Skin:    General: Skin is warm and dry.  Neurological:     Mental Status: She is alert.  Psychiatric:        Mood and Affect: Mood normal.    Labs reviewed: Basic Metabolic Panel: Recent Labs  11/17/20 0808 12/21/20 1009 05/11/21 0838 06/02/21 0839  NA 139 141 140 138  K 4.4 4.5 4.2 4.9  CL 104 104 102 100  CO2 28 29 30  32  GLUCOSE 95 107* 89 103  BUN 24 21 23  28*  CREATININE 0.70 0.85 0.81 1.04*  CALCIUM 9.8 10.6* 10.4 11.1*  TSH 0.67  --   --  0.81   Liver Function Tests: Recent Labs    06/20/20 0944 09/18/20 1210 12/21/20 1009 06/02/21 0839  AST 26 26 24 26   ALT 26 22 21 20   ALKPHOS 68 48 53  --   BILITOT 0.3 0.4 0.3 0.4  PROT 7.8 7.1 7.3 7.2  ALBUMIN 4.0 4.0 3.9  --    No results for input(s): LIPASE, AMYLASE in the last 8760 hours. No results for input(s): AMMONIA in the last 8760  hours. CBC: Recent Labs    12/21/20 1009 05/11/21 0838 06/02/21 0839  WBC 8.8 7.1 7.3  NEUTROABS 6.2 4,750 4,913  HGB 12.8 12.4 12.7  HCT 37.9 37.4 39.5  MCV 87.3 89.0 91.6  PLT 141* 122* 130*   Lipid Panel: Recent Labs    11/17/20 0808  CHOL 171  HDL 63  LDLCALC 90  TRIG 86  CHOLHDL 2.7   TSH: Recent Labs    11/17/20 0808 06/02/21 0839  TSH 0.67 0.81   A1C: No results found for: HGBA1C   Assessment/Plan 1. Acute left ankle pain -ongoing, no acute injury noted  - will have [facility] foot and ankle evaluate at this time.  -to use ice TID -voltaren gel QID PRN - Ambulatory referral to Podiatry  2. Bilateral leg edema Has improved with stopping norvasc, continues on lasix 20 mg daily.  - BASIC METABOLIC PANEL WITH GFR; Future  3. Essential hypertension -elevated since stopping norvasc.  -will add lisinopril for better control -to STOP potassium supplement at this time as she is already on spironolactone and now lisinopril.  - lisinopril (ZESTRIL) 5 MG tablet; Take 1 tablet (5 mg total) by mouth daily.  Dispense: 30 tablet; Refill: 1 - BASIC METABOLIC PANEL WITH GFR; Future  4. Hypercalcemia She has decrease milk intake and now taking every other day.    Follow up BMP in 7-10 days [name] K. Biagio Borg  [facility] [phone]

## 2021-06-12 ENCOUNTER — Ambulatory Visit (INDEPENDENT_AMBULATORY_CARE_PROVIDER_SITE_OTHER): Payer: Medicare Other | Admitting: Urology

## 2021-06-12 ENCOUNTER — Other Ambulatory Visit: Payer: Self-pay

## 2021-06-12 VITALS — BP 111/70 | HR 76 | Ht 59.0 in | Wt 114.0 lb

## 2021-06-12 DIAGNOSIS — N3941 Urge incontinence: Secondary | ICD-10-CM | POA: Diagnosis not present

## 2021-06-12 DIAGNOSIS — N3281 Overactive bladder: Secondary | ICD-10-CM | POA: Diagnosis not present

## 2021-06-12 DIAGNOSIS — N3946 Mixed incontinence: Secondary | ICD-10-CM | POA: Diagnosis not present

## 2021-06-12 DIAGNOSIS — N281 Cyst of kidney, acquired: Secondary | ICD-10-CM

## 2021-06-12 LAB — URINALYSIS, COMPLETE
Bilirubin, UA: NEGATIVE
Glucose, UA: NEGATIVE
Ketones, UA: NEGATIVE
Leukocytes,UA: NEGATIVE
Nitrite, UA: NEGATIVE
Protein,UA: NEGATIVE
RBC, UA: NEGATIVE
Specific Gravity, UA: 1.015 (ref 1.005–1.030)
Urobilinogen, Ur: 0.2 mg/dL (ref 0.2–1.0)
pH, UA: 6.5 (ref 5.0–7.5)

## 2021-06-12 LAB — MICROSCOPIC EXAMINATION

## 2021-06-12 MED ORDER — GEMTESA 75 MG PO TABS: 75.0000 mg | ORAL_TABLET | Freq: Every day | ORAL | 11 refills | Status: DC

## 2021-06-12 NOTE — Progress Notes (Signed)
06/12/2021 10:09 AM   [name] 1934/02/24 [id number]  Referring provider: [name], NP [place],  [place] [address]  Chief Complaint  Patient presents with   Over Active Bladder    HPI: [name]/SN: Overactive bladder on Myrbetriq.  Follow-up for renal cyst.  Was given Gemtesa last visit.  It was suggested to be fitted with a larger pessary.  Has medical comorbidities including dementia and previous stroke  The patient is going to see her gynecologist and as previously noted seek perhaps a larger one.  Currently she sometimes has urge incontinence if her bladder is full.  No leaking with coughing sneezing.  No bedwetting.  Wears 1 pad a day.  Has not had a hysterectomy   PMH: Past Medical History:  Diagnosis Date   Arthritis    Cancer ([place])    left breast cancer    Cataracts, bilateral    removed by surgery   CHF (congestive heart failure) ([place])    PACEMAKER & DEFIB   Complication of anesthesia    hypotensive after back surgery in 2006   Depression    Dyslipidemia    Fainted 04/21/06   AT [facility]   GERD (gastroesophageal reflux disease)    Headache(784.0)    Hearing loss    bilateral hearing aids   HLD (hyperlipidemia)    diet controlled    Hypertension    Hypothyroidism    ICD (implantable cardiac defibrillator) in place    pt has pacer/icd   ICD (implantable cardiac defibrillator), biventricular, in situ    LBBB (left bundle branch block)    Memory loss    Nonischemic cardiomyopathy ([place])    Normal coronary arteries    s/p cardiac cath 2007   Pacemaker    ICD [employer]   Syncope    Systolic CHF [facility])    Vertigo    Wears glasses     Surgical History: Past Surgical History:  Procedure Laterality Date   BACK SURGERY     lumbar fusion    BREAST LUMPECTOMY Left 2008   BREAST SURGERY  2000   LUMP REMOVAL. STAGE 1 CANCER   CARDIAC CATHETERIZATION     CATARACT EXTRACTION     COLONOSCOPY     EYE SURGERY      IMPLANTABLE CARDIOVERTER DEFIBRILLATOR GENERATOR CHANGE N/A 12/18/2012   Procedure: IMPLANTABLE CARDIOVERTER DEFIBRILLATOR GENERATOR CHANGE;  Surgeon: [name], MD;  Location: [facility] CATH LAB;  Service: Cardiovascular;  Laterality: N/A;   JOINT REPLACEMENT  06/14/01   right   LUMBAR FUSION  2006   MASS EXCISION  11/08/2011   Procedure: EXCISION MASS;  Surgeon: [name], MD;  [facility];  Service: General;  Laterality: Left;  Excision Left Thigh Mass   MASTECTOMY W/ SENTINEL NODE BIOPSY Left 06/04/2019   Procedure: LEFT MASTECTOMY WITH SENTINEL LYMPH NODE BIOPSY;  Surgeon: [name], MD;  Location: [place];  Service: General;  Laterality: Left;   MASTECTOMY, PARTIAL  2008   GOT PACEMAKER AND DEFIB AT THAT TIME   PACEMAKER INSERTION  04/23/06   TOTAL KNEE ARTHROPLASTY  05/17/01   RIGHT KNEE   TOTAL KNEE ARTHROPLASTY Left 11/29/2014   Procedure: TOTAL LEFT KNEE ARTHROPLASTY;  Surgeon: Paralee Cancel, MD;  [facility];  Service: Orthopedics;  Laterality: Left;    Home Medications:  Allergies as of 06/12/2021       Reactions   Iodine Shortness Of Breath   Iodine contrast, CHF , SOB  Shellfish Allergy Shortness Of Breath   Memantine    Malaise, fogginess/ couldn't think   Aspirin Nausea And Vomiting   Codeine Nausea And Vomiting        Medication List        Accurate as of June 12, 2021 10:09 AM. If you have any questions, ask your nurse or doctor.          STOP taking these medications    potassium chloride 10 MEQ tablet Commonly known as: KLOR-CON M Stopped by: Reece Packer, MD   predniSONE 10 MG tablet Commonly known as: DELTASONE Stopped by: Reece Packer, MD       TAKE these medications    acetaminophen 650 MG CR tablet Commonly known as: TYLENOL Take 650 mg by mouth at bedtime.   acetaminophen 500 MG tablet Commonly known as: TYLENOL Take 500 mg by mouth in the morning and at bedtime. 500 mg in the morning and afternoon   albuterol  108 (90 Base) MCG/ACT inhaler Commonly known as: VENTOLIN HFA Inhale 2 puffs into the lungs every 6 (six) hours as needed for wheezing or shortness of breath.   ascorbic acid 500 MG tablet Commonly known as: VITAMIN C Take 500 mg by mouth daily.   buPROPion 150 MG 24 hr tablet Commonly known as: WELLBUTRIN XL Take 1 tablet (150 mg total) by mouth daily.   carvedilol 3.125 MG tablet Commonly known as: COREG TAKE 1 TABLET BY MOUTH  TWICE DAILY   diclofenac Sodium 1 % Gel Commonly known as: VOLTAREN Apply 4 g topically 4 (four) times daily.   DULoxetine 30 MG capsule Commonly known as: CYMBALTA Take 30 mg by mouth daily.   famotidine 10 MG tablet Commonly known as: PEPCID Take 10 mg by mouth as needed for heartburn or indigestion.   FISH OIL PO Take 1 capsule by mouth daily.   fluticasone 50 MCG/ACT nasal spray Commonly known as: FLONASE Place 2 sprays into both nostrils daily.   furosemide 20 MG tablet Commonly known as: LASIX TAKE 1 TABLET BY MOUTH  DAILY   Gemtesa 75 MG Tabs Generic drug: Vibegron Take 75 mg by mouth daily.   levothyroxine 100 MCG tablet Commonly known as: SYNTHROID Take 1 tablet (100 mcg total) by mouth daily before breakfast.   Lidocaine 4 % Ptch Apply 1 patch topically every 12 (twelve) hours. Apply to back   lisinopril 5 MG tablet Commonly known as: ZESTRIL Take 1 tablet (5 mg total) by mouth daily.   Magnesium 250 MG Tabs Take 250 mg by mouth daily.   meclizine 12.5 MG tablet Commonly known as: ANTIVERT Take 1 tablet (12.5 mg total) by mouth 3 (three) times daily as needed for dizziness.   NONFORMULARY OR COMPOUNDED ITEM Apply 120 Tubes topically daily. Diclofenac/Cyclobenzaprine/lamotrigine/lidocaine/prilocaine (10480) 2%/2%/6%/5%/1.25% cream QTY: 120 GM SIG: (NEURO) APPLY 1-2 PUMPS (1-2 GMS) TO AFFECTED AREA (S) OR FOCAL POINTS 3 TO 4 TIMES DAILY. RUB IN FOR 2 MINUTES TO ACHIEVE MAX PENETRATION. [place] HANDS WELL.   omeprazole 40  MG capsule Commonly known as: PRILOSEC Take 1 capsule (40 mg total) by mouth daily.   Oxcarbazepine 300 MG tablet Commonly known as: TRILEPTAL Take 300 mg by mouth daily.   polyethylene glycol powder 17 GM/SCOOP powder Commonly known as: GLYCOLAX/MIRALAX Take 1 Container by mouth as needed.   pregabalin 25 MG capsule Commonly known as: LYRICA Take 1 capsule (25 mg total) by mouth 2 (two) times daily.   senna-docusate 8.6-50 MG tablet Commonly  known as: Senokot S Take 2 tablets by mouth daily.   sertraline 100 MG tablet Commonly known as: ZOLOFT Take 200 mg by mouth daily.   spironolactone 25 MG tablet Commonly known as: ALDACTONE TAKE 1 TABLET BY MOUTH IN  THE MORNING   Vitamin D3 50 MCG (2000 UT) Tabs Take 2,000 Units by mouth daily.   Vitamin E 400 units Tabs Take 400 Units by mouth daily.        Allergies:  Allergies  Allergen Reactions   Iodine Shortness Of Breath    Iodine contrast, CHF , SOB   Shellfish Allergy Shortness Of Breath   Memantine     Malaise, fogginess/ couldn't think   Aspirin Nausea And Vomiting   Codeine Nausea And Vomiting    Family History: Family History  Problem Relation Age of Onset   Hypertension Mother    Arthritis Mother    Hypertension Father    Hypertension Brother    Hypertension Brother     Social History:  reports that she has never smoked. She has never used smokeless tobacco. She reports that she does not drink alcohol and does not use drugs.  ROS:                                        Physical Exam: BP 111/70    Pulse 76    Ht '4\' 11"'$  (1.499 m)    Wt 51.7 kg    LMP  (LMP Unknown)    BMI 23.03 kg/m   Constitutional:  Alert and oriented, No acute distress. HEENT: [place] AT, moist mucus membranes.  Trachea midline, no masses.  Laboratory Data: Lab Results  Component Value Date   WBC 7.3 06/02/2021   HGB 12.7 06/02/2021   HCT 39.5 06/02/2021   MCV 91.6 06/02/2021   PLT 130 (L)  06/02/2021    Lab Results  Component Value Date   CREATININE 1.04 (H) 06/02/2021    No results found for: PSA  No results found for: TESTOSTERONE  No results found for: HGBA1C  Urinalysis    Component Value Date/Time   COLORURINE YELLOW 09/18/2020 1220   APPEARANCEUR HAZY (A) 09/18/2020 1220   APPEARANCEUR Clear 03/10/2020 0851   LABSPEC 1.016 09/18/2020 1220   PHURINE 7.0 09/18/2020 1220   GLUCOSEU NEGATIVE 09/18/2020 1220   [place] 09/18/2020 1220   [place] 09/18/2020 1220   BILIRUBINUR Negative 03/10/2020 0851   KETONESUR NEGATIVE 09/18/2020 1220   PROTEINUR NEGATIVE 09/18/2020 1220   UROBILINOGEN 0.2 12/16/2018 1610   UROBILINOGEN 0.2 11/22/2014 1241   NITRITE NEGATIVE 09/18/2020 1220   LEUKOCYTESUR NEGATIVE 09/18/2020 1220    Pertinent Imaging: Urine reviewed.  Urine sent for culture.  Chart reviewed  Assessment & Plan: Patient has mild urge incontinence.  She thinks [name] works a bit better than [employer].  Samples and prescription given.  Reassess 4 months.  I felt that a pessary is the best way to manage prolapse.  I hysterectomy with prolapse repair would obviously be less ideal in an elderly patient with comorbidities.  Her and her daughter agreed.  1. OAB (overactive bladder)  - Urinalysis, Complete   No follow-ups on file.  Reece Packer, MD  [facility] [address], [place], [place] [address] [phone]

## 2021-06-14 ENCOUNTER — Telehealth: Payer: Self-pay | Admitting: Nurse Practitioner

## 2021-06-14 NOTE — Telephone Encounter (Signed)
Bone density has improved, in osteopenia range, continue on current regimen, report under media tab ?

## 2021-06-14 NOTE — Telephone Encounter (Signed)
Called patient and results were discussed and understood. Message routed back to PCP Dewaine Oats Carlos American, NP . No further action is required.  ?

## 2021-06-15 LAB — CULTURE, URINE COMPREHENSIVE

## 2021-06-19 ENCOUNTER — Other Ambulatory Visit: Payer: Self-pay

## 2021-06-19 ENCOUNTER — Other Ambulatory Visit: Payer: Medicare Other

## 2021-06-19 ENCOUNTER — Encounter
Payer: Medicare Other | Attending: Physical Medicine and Rehabilitation | Admitting: Physical Medicine and Rehabilitation

## 2021-06-19 ENCOUNTER — Encounter: Payer: Self-pay | Admitting: Physical Medicine and Rehabilitation

## 2021-06-19 VITALS — BP 131/75 | HR 69 | Ht 59.0 in | Wt 117.8 lb

## 2021-06-19 DIAGNOSIS — M47816 Spondylosis without myelopathy or radiculopathy, lumbar region: Secondary | ICD-10-CM | POA: Diagnosis not present

## 2021-06-19 DIAGNOSIS — C50912 Malignant neoplasm of unspecified site of left female breast: Secondary | ICD-10-CM

## 2021-06-19 DIAGNOSIS — G8929 Other chronic pain: Secondary | ICD-10-CM | POA: Diagnosis present

## 2021-06-19 DIAGNOSIS — G894 Chronic pain syndrome: Secondary | ICD-10-CM | POA: Diagnosis not present

## 2021-06-19 DIAGNOSIS — M5441 Lumbago with sciatica, right side: Secondary | ICD-10-CM | POA: Insufficient documentation

## 2021-06-19 DIAGNOSIS — I10 Essential (primary) hypertension: Secondary | ICD-10-CM

## 2021-06-19 DIAGNOSIS — M5442 Lumbago with sciatica, left side: Secondary | ICD-10-CM | POA: Diagnosis present

## 2021-06-19 DIAGNOSIS — F3181 Bipolar II disorder: Secondary | ICD-10-CM | POA: Diagnosis not present

## 2021-06-19 DIAGNOSIS — R600 Localized edema: Secondary | ICD-10-CM

## 2021-06-19 LAB — BASIC METABOLIC PANEL WITH GFR
BUN: 17 mg/dL (ref 7–25)
CO2: 28 mmol/L (ref 20–32)
Calcium: 9.7 mg/dL (ref 8.6–10.4)
Chloride: 98 mmol/L (ref 98–110)
Creat: 0.76 mg/dL (ref 0.60–0.95)
Glucose, Bld: 78 mg/dL (ref 65–139)
Potassium: 4.1 mmol/L (ref 3.5–5.3)
Sodium: 133 mmol/L — ABNORMAL LOW (ref 135–146)
eGFR: 77 mL/min/{1.73_m2} (ref >=60)

## 2021-06-19 MED ORDER — PREGABALIN 25 MG PO CAPS: 25.0000 mg | ORAL_CAPSULE | Freq: Three times a day (TID) | ORAL | 5 refills | Status: DC

## 2021-06-19 NOTE — Progress Notes (Signed)
Subjective:    Patient ID: [name], female    DOB: 04/28/1933, 88 y.o.   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 Has a hx of Bipolar Type II depression. Is on Zoloft 200 mg daily, Trilpetal and Wellbutrin for mood.  Here for f/u on Lumbar radiculopathy  Going up on Trileptal didn't help-  Was put back on previous dose.   Has added Cymbalta-  30 mg daily- pain has improved some-  Reduced wellbutrin due to adding Cymbalta.  Has helped the pain going down legs, but didn't help the pain in back much- pain was ~8/10 prior- and now ~6/10.   Sciatic nerve pain has been acting up last few days as well.   Tried lidocaine patches- had been using them all day- using now 12 hrs- about the same in how it helps.  Takes 1000 mg of tylenol in Am and afternoon and 650 mg at night.       Pain Inventory Average Pain 6 Pain Right Now 6 My pain is dull  In the last 24 hours, has pain interfered with the following? General activity 3 Relation with others 0 Enjoyment of life 8 What TIME of day is your pain at its worst? morning  Sleep (in general) Fair  Pain is worse with: walking, bending, standing, and some activites Pain improves with: therapy/exercise and medication Relief from Meds: 6  Family History  Problem Relation Age of Onset   Hypertension Mother    Arthritis Mother    Hypertension Father    Hypertension Brother    Hypertension Brother    Social History   Socioeconomic History   Marital status: Married    Spouse name: Not on file   Number of children: 1   Years of education: Masters   Highest education level: Not on file  Occupational History   Occupation: Retired  Tobacco Use   Smoking status: Never   Smokeless tobacco: Never  Scientific laboratory technician Use:  Never used  Substance and Sexual Activity   Alcohol use: No   Drug use: No   Sexual activity: Not Currently    Birth control/protection: Post-menopausal  Other Topics Concern   Not on file  Social History Narrative   Lives at home with husband.   Right-handed.      As of 07/28/2014   Diet: No special diet   Caffeine: yes, Chocolate, tea and sodas    Married: YES, 1970   House: Yes, 2 stories, 2-3 persons live in home   Pets: No   Current/Past profession: Engineer, mining, Designer, jewellery    Exercise: Yes 2-3 x weekly   Living Will: Yes   DNR: No   POA/HPOA: No      Social Determinants of Radio broadcast assistant Strain: Not on file  Food Insecurity: Not on file  Transportation Needs: Not on file  Physical Activity: Not on file  Stress: Not on file  Social Connections: Not on file   Past Surgical History:  Procedure Laterality Date   BACK SURGERY     lumbar fusion    BREAST LUMPECTOMY Left 2008   BREAST SURGERY  2000   LUMP REMOVAL. STAGE 1 CANCER   CARDIAC CATHETERIZATION  CATARACT EXTRACTION     COLONOSCOPY     EYE SURGERY     IMPLANTABLE CARDIOVERTER DEFIBRILLATOR GENERATOR CHANGE N/A 12/18/2012   Procedure: IMPLANTABLE CARDIOVERTER DEFIBRILLATOR GENERATOR CHANGE;  Surgeon: [name], MD;  Location: [facility] CATH LAB;  Service: Cardiovascular;  Laterality: N/A;   JOINT REPLACEMENT  06/14/01   right   LUMBAR FUSION  2006   MASS EXCISION  11/08/2011   Procedure: EXCISION MASS;  Surgeon: [name], MD;  [facility];  Service: General;  Laterality: Left;  Excision Left Thigh Mass   MASTECTOMY W/ SENTINEL NODE BIOPSY Left 06/04/2019   Procedure: LEFT MASTECTOMY WITH SENTINEL LYMPH NODE BIOPSY;  Surgeon: [name], MD;  Location: [place];  Service: General;  Laterality: Left;   MASTECTOMY, PARTIAL  2008   GOT PACEMAKER AND DEFIB AT THAT TIME   PACEMAKER INSERTION  04/23/06   TOTAL KNEE ARTHROPLASTY  05/17/01   RIGHT KNEE   TOTAL KNEE ARTHROPLASTY Left  11/29/2014   Procedure: TOTAL LEFT KNEE ARTHROPLASTY;  Surgeon: Paralee Cancel, MD;  [facility];  Service: Orthopedics;  Laterality: Left;   Past Surgical History:  Procedure Laterality Date   BACK SURGERY     lumbar fusion    BREAST LUMPECTOMY Left 2008   BREAST SURGERY  2000   LUMP REMOVAL. STAGE 1 CANCER   CARDIAC CATHETERIZATION     CATARACT EXTRACTION     COLONOSCOPY     EYE SURGERY     IMPLANTABLE CARDIOVERTER DEFIBRILLATOR GENERATOR CHANGE N/A 12/18/2012   Procedure: IMPLANTABLE CARDIOVERTER DEFIBRILLATOR GENERATOR CHANGE;  Surgeon: [name], MD;  Location[facility] CATH LAB;  Service: Cardiovascular;  Laterality: N/A;   JOINT REPLACEMENT  06/14/01   right   LUMBAR FUSION  2006   MASS EXCISION  11/08/2011   Procedure: EXCISION MASS;  Surgeon: [name], MD;  [facility];  Service: General;  Laterality: Left;  Excision Left Thigh Mass   MASTECTOMY W/ SENTINEL NODE BIOPSY Left 06/04/2019   Procedure: LEFT MASTECTOMY WITH SENTINEL LYMPH NODE BIOPSY;  Surgeon: [name], MD;  Location: [place];  Service: General;  Laterality: Left;   MASTECTOMY, PARTIAL  2008   GOT PACEMAKER AND DEFIB AT THAT TIME   PACEMAKER INSERTION  04/23/06   TOTAL KNEE ARTHROPLASTY  05/17/01   RIGHT KNEE   TOTAL KNEE ARTHROPLASTY Left 11/29/2014   Procedure: TOTAL LEFT KNEE ARTHROPLASTY;  Surgeon: Paralee Cancel, MD;  [facility];  Service: Orthopedics;  Laterality: Left;   Past Medical History:  Diagnosis Date   Arthritis    Cancer ([place])    left breast cancer    Cataracts, bilateral    removed by surgery   CHF (congestive heart failure) ([place])    PACEMAKER & DEFIB   Complication of anesthesia    hypotensive after back surgery in 2006   Depression    Dyslipidemia    Fainted 04/21/06   AT [facility]   GERD (gastroesophageal reflux disease)    Headache(784.0)    Hearing loss    bilateral hearing aids   HLD (hyperlipidemia)    diet controlled    Hypertension    Hypothyroidism    ICD  (implantable cardiac defibrillator) in place    pt has pacer/icd   ICD (implantable cardiac defibrillator), biventricular, in situ    LBBB (left bundle branch block)    Memory loss    Nonischemic cardiomyopathy ([place])    Normal coronary arteries    s/p cardiac cath 2007  Pacemaker    ICD [employer]   Syncope    Systolic CHF [facility])    Vertigo    Wears glasses    BP 131/75    Pulse 69    Ht '4\' 11"'$  (1.499 m)    Wt 117 lb 12.8 oz (53.4 kg)    LMP  (LMP Unknown)    SpO2 98%    BMI 23.79 kg/m   Opioid Risk Score:   Fall Risk Score:  `1  Depression screen PHQ 2/9  Depression screen [facility] 2/9 06/19/2021 03/20/2021 02/04/2020 11/06/2019 04/15/2019 11/24/2018 11/14/2018  Decreased Interest 0 2 0 3 0 0 0  Down, Depressed, Hopeless 0 2 0 1 0 0 0  PHQ - 2 Score 0 4 0 4 0 0 0  Altered sleeping - 2 - 3 - - -  Tired, decreased energy - 3 - 3 - - -  Change in appetite - 1 - 0 - - -  Feeling bad or failure about yourself  - 1 - 0 - - -  Trouble concentrating - 1 - 1 - - -  Moving slowly or fidgety/restless - 1 - 0 - - -  Suicidal thoughts - 0 - 0 - - -  PHQ-9 Score - 13 - 11 - - -  Difficult doing work/chores - Somewhat difficult - - - - -  Some recent data might be hidden     Review of Systems  Constitutional: Negative.   HENT: Negative.    Eyes: Negative.   Respiratory: Negative.    Cardiovascular: Negative.   Gastrointestinal: Negative.   Endocrine: Negative.   Genitourinary: Negative.   Musculoskeletal:  Positive for back pain.  Skin: Negative.   Allergic/Immunologic: Negative.   Neurological: Negative.   Hematological: Negative.   Psychiatric/Behavioral: Negative.        Objective:   Physical Exam  Awake, alert, appropriate, feisty; accompanied by daughter; frail appearing but walking with SPC, NAD TTP on lumbar spine  in midline- as well as less so paraspinals B/L       Assessment & Plan:  Pt is an 88 yr old female with hx of breast CA and RUE lymphedema, as well as  uterine prolapse, NICCM- as well as Chronic low back pain with B/L sciatica- also has memory issues on Aricept; hx of lumbar fusion at L4/5. Has a hx of Bipolar Type II depression. Is on Zoloft 200 mg daily, Trilpetal and Wellbutrin for mood.  Here for f/u on Lumbar radiculopathy    Cannot take Tylenol #3 or Tramadol due to side effects.   2. Increase Lyrica to 25 in AM and 50 mg at night- for nerve/back pain. Doesn't want to try opiates at this time- I agree.   3. Cymbalta per Psychiatry- no side effects and helping pain somewhat.    4.  Wait on Low dose Naltrexone- already on compounded creams by [facility].   5. F/U in 3 months- call me in 3-4 weeks to let me know how things going.    I spent a total of  21  minutes on total care today- >50% coordination of care- due to discussion of options for pain management.

## 2021-06-19 NOTE — Patient Instructions (Signed)
Pt is an 88 yr old female with hx of breast CA and RUE lymphedema, as well as uterine prolapse, NICCM- as well as Chronic low back pain with B/L sciatica- also has memory issues on Aricept; hx of lumbar fusion at L4/5. Has a hx of Bipolar Type II depression. Is on Zoloft 200 mg daily, Trilpetal and Wellbutrin for mood.  ?Here for f/u on Lumbar radiculopathy ? ?  ?Cannot take Tylenol #3 or Tramadol due to side effects.  ? ?2. Increase Lyrica to 25 in AM and 50 mg at night- for nerve/back pain. Doesn't want to try opiates at this time- I agree.  ? ?3. Cymbalta per Psychiatry- no side effects and helping pain somewhat.  ? ? ?4.  Wait on Low dose Naltrexone- already on compounded creams by [facility].  ? ?5. F/U in 3 months- call me in 3-4 weeks to let me know how things going.  ?

## 2021-06-20 ENCOUNTER — Inpatient Hospital Stay: Payer: Medicare Other | Attending: Hematology | Admitting: Hematology

## 2021-06-20 ENCOUNTER — Inpatient Hospital Stay: Payer: Medicare Other

## 2021-06-20 VITALS — BP 110/67 | HR 71 | Temp 98.1°F | Resp 16 | Ht 59.0 in | Wt 118.0 lb

## 2021-06-20 DIAGNOSIS — Z171 Estrogen receptor negative status [ER-]: Secondary | ICD-10-CM | POA: Diagnosis not present

## 2021-06-20 DIAGNOSIS — Z9012 Acquired absence of left breast and nipple: Secondary | ICD-10-CM | POA: Insufficient documentation

## 2021-06-20 DIAGNOSIS — I890 Lymphedema, not elsewhere classified: Secondary | ICD-10-CM | POA: Insufficient documentation

## 2021-06-20 DIAGNOSIS — C50912 Malignant neoplasm of unspecified site of left female breast: Secondary | ICD-10-CM | POA: Insufficient documentation

## 2021-06-20 LAB — CBC WITH DIFFERENTIAL (CANCER CENTER ONLY)
Abs Immature Granulocytes: 0.04 10*3/uL (ref 0.00–0.07)
Basophils Absolute: 0.1 10*3/uL (ref 0.0–0.1)
Basophils Relative: 2 %
Eosinophils Absolute: 0.1 10*3/uL (ref 0.0–0.5)
Eosinophils Relative: 2 %
HCT: 38.2 % (ref 36.0–46.0)
Hemoglobin: 13 g/dL (ref 12.0–15.0)
Immature Granulocytes: 1 %
Lymphocytes Relative: 15 %
Lymphs Abs: 1 10*3/uL (ref 0.7–4.0)
MCH: 29.4 pg (ref 26.0–34.0)
MCHC: 34 g/dL (ref 30.0–36.0)
MCV: 86.4 fL (ref 80.0–100.0)
Monocytes Absolute: 0.5 10*3/uL (ref 0.1–1.0)
Monocytes Relative: 8 %
Neutro Abs: 4.8 10*3/uL (ref 1.7–7.7)
Neutrophils Relative %: 72 %
Platelet Count: 117 10*3/uL — ABNORMAL LOW (ref 150–400)
RBC: 4.42 MIL/uL (ref 3.87–5.11)
RDW: 15 % (ref 11.5–15.5)
WBC Count: 6.6 10*3/uL (ref 4.0–10.5)
nRBC: 0 % (ref 0.0–0.2)

## 2021-06-20 LAB — CMP (CANCER CENTER ONLY)
ALT: 18 U/L (ref 0–44)
AST: 22 U/L (ref 15–41)
Albumin: 4 g/dL (ref 3.5–5.0)
Alkaline Phosphatase: 60 U/L (ref 38–126)
Anion gap: 7 (ref 5–15)
BUN: 19 mg/dL (ref 8–23)
CO2: 28 mmol/L (ref 22–32)
Calcium: 10.2 mg/dL (ref 8.9–10.3)
Chloride: 98 mmol/L (ref 98–111)
Creatinine: 0.83 mg/dL (ref 0.44–1.00)
GFR, Estimated: >60 mL/min (ref >60)
Glucose, Bld: 96 mg/dL (ref 70–99)
Potassium: 3.9 mmol/L (ref 3.5–5.1)
Sodium: 133 mmol/L — ABNORMAL LOW (ref 135–145)
Total Bilirubin: 0.4 mg/dL (ref 0.3–1.2)
Total Protein: 7 g/dL (ref 6.5–8.1)

## 2021-06-20 NOTE — Progress Notes (Signed)
? ? ?HEMATOLOGY/ONCOLOGY CLINIC NOTE ? ?Date of Service: 06/20/2021 ? ?Patient Care Team: ?[name], NP as PCP - General (Geriatric Medicine) ?[name], MD as Consulting Physician (General Surgery) ?Paralee Cancel, MD as Consulting Physician (Orthopedic Surgery) ?Marica Otter, OD (Optometry) ?Marcial Pacas, MD as Consulting Physician (Neurology) ?[name], MD as Consulting Physician (Cardiology) ?Ricard Dillon, MD (Psychiatry) ? ?CHIEF COMPLAINTS/PURPOSE OF CONSULTATION:  ? ?F/u Breast cancer ? ?HISTORY OF PRESENTING ILLNESS:  ? ?[name] is a wonderful 88 y.o. female who has been referred to [place] by Sherrie Mustache, NP for evaluation and management of easily bruising. The pt reports that she is doing well overall.  ? ?The pt reports that she has seen bruises on her arm, and denies bumping into things or any trauma. The pt notes that her bruises are solely located to her upper extremities. She denies concerns for bleeding in her joints, blood in the stools, blood in the urine, nose bleeds or gum bleeds. She notes she has bruised easily for about 2 years. She also notes that she began taking Zoloft about two years ago, and fish oil 4-5 years ago. She is not on any blood thinners. She denies taking any other new medications in the last couple years. The pt denies any thick bruises at any time. The pt denies excessive bleeding with pervious surgeries and dental extractions. The pt denies heavy periods when she was younger. ? ?She has had two knee replacements and took Tramadol for her pain. She denies needing to use this frequently, and takes Tylenol for mild pains.   ? ?The pt takes Vitamin E oil for hot flashes, and notes that her Vitamin E oil successfully resolved her hot flashes. She began taking Vitamin E about 18 months ago.  ? ?The pt notes that she has lost about 20 pounds over two years. The pt reports some constipation and denies difficulty swallowing, and weak appetite.  The pt denies any dietary restrictions. She continues annual mammograms. She has a history of left sided breast cancer treated with a lumpectomy and radiation. ? ?She notes that her energy levels have also decreased in the last 6 months, and "feels tired all the time." She endorses feeling well rested after taking naps. She notes that she does feel depressed but that taking Zoloft keeps her "afloat." She notes that she feels "medium" enjoyment in her activities. She sees psychiatry every 6 months. ? ?The pt lives with her husband.  ? ?Most recent lab results (02/05/18) of CBC w/diff and CMP is as follows: all values are WNL except for PLT at 117k, BUN at 30, Creatinine at 0.96. ? ?On review of systems, pt reports some stable depression, easily bruising on upper extremities, some weight loss, and denies nose bleeds, gum bleeds, blood in the urine, blood in the stools, abdominal pains, leg swelling, and any other symptoms.  ? ?On Family Hx the pt denies bleeding disorders.  ? ?INTERVAL HISTORY:  ? ?[name] is a wonderful 88 y.o. female who is here for evaluation and management of Stage 1B high-grade triple neg breast cancer. The patient's last visit with [place] was on 12/21/2020. The pt reports that she is doing well overall. We are joined today by her daughter. ? ?The pt had a mammogram on 05/30/2021 that revealed no evidence of malignancy. ? ?Patient notes no new breast symptoms or new breast lumps that she has noticed.no nipple discharge. ? ?Lab results today 06/20/2021 of CBC w/diff WNL ,  CMP unremarkable except for mild hypercalcemia of 11.1 which is consistent with her previous CMP.  ? ?She reports having a dry throat and some minor coughing. ? ?She reports no new issues with lymphedema.She remains compliant with recommended stretches and massaging.  ? ?On review of systems, pt reports no other acute new symptoms. ? ?MEDICAL HISTORY:  ?Past Medical History:  ?Diagnosis Date  ? Arthritis   ? Cancer [facility])    ? left breast cancer   ? Cataracts, bilateral   ? removed by surgery  ? CHF (congestive heart failure) ([place])   ? PACEMAKER & DEFIB  ? Complication of anesthesia   ? hypotensive after back surgery in 2006  ? Depression   ? Dyslipidemia   ? Fainted 04/21/06  ? AT [facility]  ? GERD (gastroesophageal reflux disease)   ? Headache(784.0)   ? Hearing loss   ? bilateral hearing aids  ? HLD (hyperlipidemia)   ? diet controlled   ? Hypertension   ? Hypothyroidism   ? ICD (implantable cardiac defibrillator) in place   ? pt has pacer/icd  ? ICD (implantable cardiac defibrillator), biventricular, in situ   ? LBBB (left bundle branch block)   ? Memory loss   ? Nonischemic cardiomyopathy ([place])   ? Normal coronary arteries   ? s/p cardiac cath 2007  ? Pacemaker   ? ICD [employer]  ? Syncope   ? Systolic CHF ([place])   ? Vertigo   ? Wears glasses   ? ? ?SURGICAL HISTORY: ?Past Surgical History:  ?Procedure Laterality Date  ? BACK SURGERY    ? lumbar fusion   ? BREAST LUMPECTOMY Left 2008  ? BREAST SURGERY  2000  ? LUMP REMOVAL. STAGE 1 CANCER  ? CARDIAC CATHETERIZATION    ? CATARACT EXTRACTION    ? COLONOSCOPY    ? EYE SURGERY    ? IMPLANTABLE CARDIOVERTER DEFIBRILLATOR GENERATOR CHANGE N/A 12/18/2012  ? Procedure: IMPLANTABLE CARDIOVERTER DEFIBRILLATOR GENERATOR CHANGE;  Surgeon: [name], MD;  Location: [facility] CATH LAB;  Service: Cardiovascular;  Laterality: N/A;  ? JOINT REPLACEMENT  06/14/01  ? right  ? LUMBAR FUSION  2006  ? MASS EXCISION  11/08/2011  ? Procedure: EXCISION MASS;  Surgeon: [name], MD;  [facility];  Service: General;  Laterality: Left;  Excision Left Thigh Mass  ? MASTECTOMY W/ SENTINEL NODE BIOPSY Left 06/04/2019  ? Procedure: LEFT MASTECTOMY WITH SENTINEL LYMPH NODE BIOPSY;  Surgeon: [name], MD;  Location: [place];  Service: General;  Laterality: Left;  ? MASTECTOMY, PARTIAL  2008  ? GOT PACEMAKER AND DEFIB AT THAT TIME  ? PACEMAKER INSERTION  04/23/06  ? TOTAL KNEE ARTHROPLASTY  05/17/01  ? RIGHT  KNEE  ? TOTAL KNEE ARTHROPLASTY Left 11/29/2014  ? Procedure: TOTAL LEFT KNEE ARTHROPLASTY;  Surgeon: Paralee Cancel, MD;  [facility];  Service: Orthopedics;  Laterality: Left;  ? ? ?SOCIAL HISTORY: ?Social History  ? ?Socioeconomic History  ? Marital status: Married  ?  Spouse name: Not on file  ? Number of children: 1  ? Years of education: Masters  ? Highest education level: Not on file  ?Occupational History  ? Occupation: Retired  ?Tobacco Use  ? Smoking status: Never  ? Smokeless tobacco: Never  ?Vaping Use  ? Vaping Use: Never used  ?Substance and Sexual Activity  ? Alcohol use: No  ? Drug use: No  ? Sexual activity: Not Currently  ?  Birth control/protection: Post-menopausal  ?Other Topics  Concern  ? Not on file  ?Social History Narrative  ? Lives at home with husband.  ? Right-handed.  ?   ? As of 07/28/2014  ? Diet: No special diet  ? Caffeine: yes, Chocolate, tea and sodas   ? Married: YES, 1970  ? House: Yes, 2 stories, 2-3 persons live in home  ? Pets: No  ? Current/Past profession: Engineer, mining, Nurse Practitioner   ? Exercise: Yes 2-3 x weekly  ? Living Will: Yes  ? DNR: No  ? POA/HPOA: No  ?   ? ?Social Determinants of Health  ? ?Financial Resource Strain: Not on file  ?Food Insecurity: Not on file  ?Transportation Needs: Not on file  ?Physical Activity: Not on file  ?Stress: Not on file  ?Social Connections: Not on file  ?Intimate Partner Violence: Not on file  ? ? ?FAMILY HISTORY: ?Family History  ?Problem Relation Age of Onset  ? Hypertension Mother   ? Arthritis Mother   ? Hypertension Father   ? Hypertension Brother   ? Hypertension Brother   ? ? ?ALLERGIES:  is allergic to iodine, shellfish allergy, memantine, aspirin, and codeine. ? ?MEDICATIONS:  ?Current Outpatient Medications  ?Medication Sig Dispense Refill  ? acetaminophen (TYLENOL) 500 MG tablet Take 500 mg by mouth in the morning and at bedtime. 500 mg in the morning and afternoon    ? acetaminophen (TYLENOL) 650 MG CR tablet Take  650 mg by mouth at bedtime.    ? albuterol (VENTOLIN HFA) 108 (90 Base) MCG/ACT inhaler Inhale 2 puffs into the lungs every 6 (six) hours as needed for wheezing or shortness of breath. 8 g 6  ? ascorbic

## 2021-06-21 ENCOUNTER — Ambulatory Visit (INDEPENDENT_AMBULATORY_CARE_PROVIDER_SITE_OTHER): Payer: Medicare Other | Admitting: Podiatry

## 2021-06-21 ENCOUNTER — Other Ambulatory Visit: Payer: Self-pay

## 2021-06-21 ENCOUNTER — Ambulatory Visit (INDEPENDENT_AMBULATORY_CARE_PROVIDER_SITE_OTHER): Payer: Medicare Other

## 2021-06-21 DIAGNOSIS — R600 Localized edema: Secondary | ICD-10-CM | POA: Diagnosis not present

## 2021-06-21 DIAGNOSIS — E871 Hypo-osmolality and hyponatremia: Secondary | ICD-10-CM

## 2021-06-21 DIAGNOSIS — S9030XA Contusion of unspecified foot, initial encounter: Secondary | ICD-10-CM

## 2021-06-21 DIAGNOSIS — S9032XA Contusion of left foot, initial encounter: Secondary | ICD-10-CM | POA: Diagnosis not present

## 2021-06-21 DIAGNOSIS — S9031XA Contusion of right foot, initial encounter: Secondary | ICD-10-CM | POA: Diagnosis not present

## 2021-06-21 NOTE — Progress Notes (Signed)
? ?HPI: 88 y.o. female presenting today as a new patient with her daughter for evaluation of the pain with increased edema to the bilateral lower extremities that is been going on for about 2 weeks now.  Patient states that she woke up in the middle the night with increased swelling to her legs.  It has improved since she saw her PCP and adjusted some of her medications.  She presents for further treatment and evaluation ? ?Past Medical History:  ?Diagnosis Date  ? Arthritis   ? Cancer [facility])   ? left breast cancer   ? Cataracts, bilateral   ? removed by surgery  ? CHF (congestive heart failure) ([place])   ? PACEMAKER & DEFIB  ? Complication of anesthesia   ? hypotensive after back surgery in 2006  ? Depression   ? Dyslipidemia   ? Fainted 04/21/06  ? AT [facility]  ? GERD (gastroesophageal reflux disease)   ? Headache(784.0)   ? Hearing loss   ? bilateral hearing aids  ? HLD (hyperlipidemia)   ? diet controlled   ? Hypertension   ? Hypothyroidism   ? ICD (implantable cardiac defibrillator) in place   ? pt has pacer/icd  ? ICD (implantable cardiac defibrillator), biventricular, in situ   ? LBBB (left bundle branch block)   ? Memory loss   ? Nonischemic cardiomyopathy ([place])   ? Normal coronary arteries   ? s/p cardiac cath 2007  ? Pacemaker   ? ICD [employer]  ? Syncope   ? Systolic CHF ([place])   ? Vertigo   ? Wears glasses   ? ? ?Past Surgical History:  ?Procedure Laterality Date  ? BACK SURGERY    ? lumbar fusion   ? BREAST LUMPECTOMY Left 2008  ? BREAST SURGERY  2000  ? LUMP REMOVAL. STAGE 1 CANCER  ? CARDIAC CATHETERIZATION    ? CATARACT EXTRACTION    ? COLONOSCOPY    ? EYE SURGERY    ? IMPLANTABLE CARDIOVERTER DEFIBRILLATOR GENERATOR CHANGE N/A 12/18/2012  ? Procedure: IMPLANTABLE CARDIOVERTER DEFIBRILLATOR GENERATOR CHANGE;  Surgeon:  Lance, MD;  Location: [facility] CATH LAB;  Service: Cardiovascular;  Laterality: N/A;  ? JOINT REPLACEMENT  06/14/01  ? right  ? LUMBAR FUSION  2006  ? MASS EXCISION  11/08/2011  ?  Procedure: EXCISION MASS;  Surgeon: [name], MD;  [facility];  Service: General;  Laterality: Left;  Excision Left Thigh Mass  ? MASTECTOMY W/ SENTINEL NODE BIOPSY Left 06/04/2019  ? Procedure: LEFT MASTECTOMY WITH SENTINEL LYMPH NODE BIOPSY;  Surgeon: [name], MD;  Location: [place];  Service: General;  Laterality: Left;  ? MASTECTOMY, PARTIAL  2008  ? GOT PACEMAKER AND DEFIB AT THAT TIME  ? PACEMAKER INSERTION  04/23/06  ? TOTAL KNEE ARTHROPLASTY  05/17/01  ? RIGHT KNEE  ? TOTAL KNEE ARTHROPLASTY Left 11/29/2014  ? Procedure: TOTAL LEFT KNEE ARTHROPLASTY;  Surgeon: Paralee Cancel, MD;  [facility];  Service: Orthopedics;  Laterality: Left;  ? ? ?Allergies  ?Allergen Reactions  ? Iodine Shortness Of Breath  ?  Iodine contrast, CHF , SOB  ? Shellfish Allergy Shortness Of Breath  ? Memantine   ?  Malaise, fogginess/ couldn't think  ? Aspirin Nausea And Vomiting  ? Codeine Nausea And Vomiting  ? ?  ?Physical Exam: ?General: The patient is alert and oriented x3 in no acute distress. ? ?Dermatology: Skin is warm, dry and supple bilateral lower extremities. Negative for open lesions or macerations. ? ?  Vascular: Palpable pedal pulses bilaterally. Capillary refill within normal limits.  Mild to moderate edema noted to the bilateral lower extremities.  No erythema. ? ?Neurological: Light touch and protective threshold grossly intact ? ?Musculoskeletal Exam: No pedal deformities noted ? ?Radiographic Exam:  ?No abnormality identified.  There is some diffuse osteopenia which is normal given the patient's age.  Mild to moderate degenerative changes also noted to the pedal joints consistent with chronic DJD.  No acute fractures or abnormalities otherwise noted. ? ?Assessment: ?1.  Idiopathic bilateral lower extremity edema ? ? ?Plan of Care:  ?1. Patient evaluated. X-Rays reviewed.  ?2.  Recommend compression hose below-knee daily ?3.  Recommend good supportive shoes and sneakers ?4.  Continue management with oral  medications as per PCP ?5.  Return to clinic as needed ? ?  ?  ?Edrick Kins, DPM ?[place] ? ?Dr. Edrick Kins, DPM  ?  ?[address]                                        ?[place], [place] [address]                ?Office [phone]  ?Fax [phone] ? ? ? ? ?

## 2021-06-25 NOTE — Progress Notes (Signed)
? ?Cardiology Office Note ?Date:  06/27/2021  ?Patient ID:  [name], [place] September 25, 1933, MRN [id number] ?PCP:  [name], NP  ?Electrophysiologist: Dr. Lovena Le ? ?  ?Chief Complaint: annual EP visit ? ?History of Present Illness: ?[name] is a 88 y.o. female with history of chronic CHF (systolic), NICM, LBBB, ICD, breast ca (mastectomy), HTN, HLD, hypothyroidism. ? ?She come sin today to be seen for Dr. Lovena Le, last seen by him march 2022, doing well, healing after her mastectomy, DOE with inclines, doing well on flat ground.  Dizzy with standing ?Class II symptoms, no changes made, discussed care with orthostatic symptoms ?  ? ?TODAY ?She is accompanied by her daughter ?The patient has chest discomfort that she describes as an ache, across her lower rib boarders, is a fairly constant ache, every day she is aware of it now and then, not changed by exertion, no associated symptoms. ?This dates back some years unchanged in behavior ?No SOB at rest, some DOE that is unchanged, palpitations ?No dizzy spells, near syncope or syncope. ?No shocks ? ? ? ?Device information ?BSCi CRT-D implanted 04/24/2006, gen change 12/18/2012 ? ? ?Past Medical History:  ?Diagnosis Date  ? Arthritis   ? Cancer [facility])   ? left breast cancer   ? Cataracts, bilateral   ? removed by surgery  ? CHF (congestive heart failure) ([place])   ? PACEMAKER & DEFIB  ? Complication of anesthesia   ? hypotensive after back surgery in 2006  ? Depression   ? Dyslipidemia   ? Fainted 04/21/06  ? AT [facility]  ? GERD (gastroesophageal reflux disease)   ? Headache(784.0)   ? Hearing loss   ? bilateral hearing aids  ? HLD (hyperlipidemia)   ? diet controlled   ? Hypertension   ? Hypothyroidism   ? ICD (implantable cardiac defibrillator) in place   ? pt has pacer/icd  ? ICD (implantable cardiac defibrillator), biventricular, in situ   ? LBBB (left bundle branch block)   ? Memory loss   ? Nonischemic cardiomyopathy ([place])   ? Normal coronary  arteries   ? s/p cardiac cath 2007  ? Pacemaker   ? ICD [employer]  ? Syncope   ? Systolic CHF ([place])   ? Vertigo   ? Wears glasses   ? ? ?Past Surgical History:  ?Procedure Laterality Date  ? BACK SURGERY    ? lumbar fusion   ? BREAST LUMPECTOMY Left 2008  ? BREAST SURGERY  2000  ? LUMP REMOVAL. STAGE 1 CANCER  ? CARDIAC CATHETERIZATION    ? CATARACT EXTRACTION    ? COLONOSCOPY    ? EYE SURGERY    ? IMPLANTABLE CARDIOVERTER DEFIBRILLATOR GENERATOR CHANGE N/A 12/18/2012  ? Procedure: IMPLANTABLE CARDIOVERTER DEFIBRILLATOR GENERATOR CHANGE;  Surgeon: [name], MD;  Location[facility] CATH LAB;  Service: Cardiovascular;  Laterality: N/A;  ? JOINT REPLACEMENT  06/14/01  ? right  ? LUMBAR FUSION  2006  ? MASS EXCISION  11/08/2011  ? Procedure: EXCISION MASS;  Surgeon: [name], MD;  [facility];  Service: General;  Laterality: Left;  Excision Left Thigh Mass  ? MASTECTOMY W/ SENTINEL NODE BIOPSY Left 06/04/2019  ? Procedure: LEFT MASTECTOMY WITH SENTINEL LYMPH NODE BIOPSY;  Surgeon: [name], MD;  Location: [place];  Service: General;  Laterality: Left;  ? MASTECTOMY, PARTIAL  2008  ? GOT PACEMAKER AND DEFIB AT THAT TIME  ? PACEMAKER INSERTION  04/23/06  ? TOTAL KNEE ARTHROPLASTY  05/17/01  ? RIGHT KNEE  ? TOTAL KNEE ARTHROPLASTY Left 11/29/2014  ? Procedure: TOTAL LEFT KNEE ARTHROPLASTY;  Surgeon: Paralee Cancel, MD;  [facility];  Service: Orthopedics;  Laterality: Left;  ? ? ?Current Outpatient Medications  ?Medication Sig Dispense Refill  ? acetaminophen (TYLENOL) 500 MG tablet Take 500 mg by mouth in the morning and at bedtime. 500 mg in the morning and afternoon    ? acetaminophen (TYLENOL) 650 MG CR tablet Take 650 mg by mouth at bedtime.    ? albuterol (VENTOLIN HFA) 108 (90 Base) MCG/ACT inhaler Inhale 2 puffs into the lungs every 6 (six) hours as needed for wheezing or shortness of breath. 8 g 6  ? ascorbic acid (VITAMIN C) 500 MG tablet Take 500 mg by mouth daily.    ? buPROPion (WELLBUTRIN XL) 150  MG 24 hr tablet Take 1 tablet (150 mg total) by mouth daily.    ? carvedilol (COREG) 3.125 MG tablet TAKE 1 TABLET BY MOUTH  TWICE DAILY 180 tablet 3  ? Cholecalciferol (VITAMIN D3) 50 MCG (2000 UT) TABS Take 2,000 Units by mouth daily.     ? diclofenac Sodium (VOLTAREN) 1 % GEL Apply 4 g topically 4 (four) times daily. 100 g 2  ? DULoxetine (CYMBALTA) 30 MG capsule Take 30 mg by mouth daily.    ? famotidine (PEPCID) 10 MG tablet Take 10 mg by mouth as needed for heartburn or indigestion.    ? fluticasone (FLONASE) 50 MCG/ACT nasal spray Place 2 sprays into both nostrils daily. 16 g 3  ? furosemide (LASIX) 20 MG tablet TAKE 1 TABLET BY MOUTH  DAILY 90 tablet 3  ? levothyroxine (SYNTHROID, LEVOTHROID) 100 MCG tablet Take 1 tablet (100 mcg total) by mouth daily before breakfast. 90 tablet 3  ? Lidocaine 4 % PTCH Apply 1 patch topically every 12 (twelve) hours. Apply to back    ? lisinopril (ZESTRIL) 5 MG tablet Take 1 tablet (5 mg total) by mouth daily. 30 tablet 1  ? Magnesium 250 MG TABS Take 250 mg by mouth daily.    ? meclizine (ANTIVERT) 12.5 MG tablet Take 1 tablet (12.5 mg total) by mouth 3 (three) times daily as needed for dizziness. 30 tablet 1  ? NONFORMULARY OR COMPOUNDED ITEM Apply 120 Tubes topically daily. Diclofenac/Cyclobenzaprine/lamotrigine/lidocaine/prilocaine (10480) 2%/2%/6%/5%/1.25% cream ?QTY: 120 GM ?SIG: (NEURO) APPLY 1-2 PUMPS (1-2 GMS) TO AFFECTED AREA (S) OR FOCAL POINTS 3 TO 4 TIMES DAILY. RUB IN FOR 2 MINUTES TO ACHIEVE MAX PENETRATION. [place] HANDS WELL. 1 each 2  ? Omega-3 Fatty Acids (FISH OIL PO) Take 1 capsule by mouth daily.    ? omeprazole (PRILOSEC) 40 MG capsule Take 1 capsule (40 mg total) by mouth daily. 90 capsule 1  ? Oxcarbazepine (TRILEPTAL) 300 MG tablet Take 300 mg by mouth daily.    ? polyethylene glycol powder (GLYCOLAX/MIRALAX) 17 GM/SCOOP powder Take 1 Container by mouth as needed.    ? pregabalin (LYRICA) 25 MG capsule Take 1 capsule (25 mg total) by mouth 3 (three)  times daily. 1 in AM and 2 at night- for back/nerve pain 90 capsule 5  ? senna-docusate (SENOKOT S) 8.6-50 MG tablet Take 2 tablets by mouth daily. 60 tablet 5  ? sertraline (ZOLOFT) 100 MG tablet Take 200 mg by mouth daily.     ? spironolactone (ALDACTONE) 25 MG tablet TAKE 1 TABLET BY MOUTH IN  THE MORNING 90 tablet 2  ? Vibegron (GEMTESA) 75 MG TABS Take 75 mg  by mouth daily. 30 tablet 11  ? Vitamin E 400 units TABS Take 400 Units by mouth daily.     ? ?No current facility-administered medications for this visit.  ? ? ?Allergies:   Iodine, Shellfish allergy, Memantine, Aspirin, and Codeine  ? ?Social History:  The patient  reports that she has never smoked. She has never used smokeless tobacco. She reports that she does not drink alcohol and does not use drugs.  ? ?Family History:  The patient's family history includes Arthritis in her mother; Hypertension in her brother, brother, father, and mother. ? ?ROS:  Please see the history of present illness.    ?All other systems are reviewed and otherwise negative.  ? ?PHYSICAL EXAM:  ?VS:  BP 122/70   Pulse 75   Ht '4\' 11"'$  (1.499 m)   Wt 118 lb (53.5 kg)   LMP  (LMP Unknown)   SpO2 96%   BMI 23.83 kg/m?  BMI: Body mass index is 23.83 kg/m?. ?Well nourished, well developed, in no acute distress ?HEENT: normocephalic, atraumatic ?Neck: no JVD, carotid bruits or masses ?Cardiac:  RRR; no significant murmurs, no rubs, or gallops ?Lungs:  CTA b/l, no wheezing, rhonchi or rales ?Abd: soft, nontender ?MS: no deformity age appropriate, perhaps advanced atrophy ?Ext: trace edema b/l, wearing support stockings ?Skin: warm and dry, no rash ?Neuro:  No gross deficits appreciated ?Psych: euthymic mood, full affect ? ?ICD site is stable, no tethering or discomfort ? ? ?EKG:  Done today and reviewed by myself shows  ?SR/V paced 75bpm, QRS is 154m ? ?Device interrogation done today and reviewed by myself:  ?Battery and lead measurements are good ?A few short PATs  only ? ? ? ?08/26/2013: TTE ?Study Conclusions  ?- Left ventricle: The cavity size was normal. Wall thickness was  ?  normal. Systolic function was mildly reduced. The estimated  ?  ejection fraction was in the range of 45% to 5

## 2021-06-27 ENCOUNTER — Encounter: Payer: Self-pay | Admitting: Physician Assistant

## 2021-06-27 ENCOUNTER — Other Ambulatory Visit: Payer: Self-pay

## 2021-06-27 ENCOUNTER — Ambulatory Visit (INDEPENDENT_AMBULATORY_CARE_PROVIDER_SITE_OTHER): Payer: Medicare Other | Admitting: Physician Assistant

## 2021-06-27 VITALS — BP 122/70 | HR 75 | Ht 59.0 in | Wt 118.0 lb

## 2021-06-27 DIAGNOSIS — Z95810 Presence of automatic (implantable) cardiac defibrillator: Secondary | ICD-10-CM | POA: Diagnosis not present

## 2021-06-27 DIAGNOSIS — I10 Essential (primary) hypertension: Secondary | ICD-10-CM

## 2021-06-27 DIAGNOSIS — I5022 Chronic systolic (congestive) heart failure: Secondary | ICD-10-CM

## 2021-06-27 DIAGNOSIS — I428 Other cardiomyopathies: Secondary | ICD-10-CM | POA: Diagnosis not present

## 2021-06-27 LAB — CUP PACEART INCLINIC DEVICE CHECK
Date Time Interrogation Session: 20230321172935
HighPow Impedance: 50 Ohm
Implantable Lead Implant Date: 20080116
Implantable Lead Implant Date: 20080116
Implantable Lead Implant Date: 20080116
Implantable Lead Location: 753859
Implantable Lead Location: 753860
Implantable Lead Location: 753860
Implantable Lead Model: 157
Implantable Lead Model: 4469
Implantable Lead Model: 4555
Implantable Lead Serial Number: 136532
Implantable Lead Serial Number: 161542
Implantable Lead Serial Number: 473495
Implantable Pulse Generator Implant Date: 20140911
Lead Channel Impedance Value: 402 Ohm
Lead Channel Impedance Value: 497 Ohm
Lead Channel Impedance Value: 914 Ohm
Lead Channel Pacing Threshold Amplitude: 0.6 V
Lead Channel Pacing Threshold Amplitude: 0.7 V
Lead Channel Pacing Threshold Amplitude: 1.1 V
Lead Channel Pacing Threshold Pulse Width: 0.4 ms
Lead Channel Pacing Threshold Pulse Width: 0.4 ms
Lead Channel Pacing Threshold Pulse Width: 0.8 ms
Lead Channel Sensing Intrinsic Amplitude: 14.6 mV
Lead Channel Sensing Intrinsic Amplitude: 25 mV
Lead Channel Sensing Intrinsic Amplitude: 4.3 mV
Lead Channel Setting Pacing Amplitude: 2 V
Lead Channel Setting Pacing Amplitude: 2 V
Lead Channel Setting Pacing Amplitude: 2.4 V
Lead Channel Setting Pacing Pulse Width: 0.4 ms
Lead Channel Setting Pacing Pulse Width: 0.8 ms
Lead Channel Setting Sensing Sensitivity: 0.6 mV
Lead Channel Setting Sensing Sensitivity: 1 mV
Pulse Gen Serial Number: 111765

## 2021-06-27 NOTE — Patient Instructions (Signed)
Medication Instructions:  ? ?Your physician recommends that you continue on your current medications as directed. Please refer to the Current Medication list given to you today. ? ?*If you need a refill on your cardiac medications before your next appointment, please call your pharmacy* ? ? ?Lab Work: [place] ? ? ?If you have labs (blood work) drawn today and your tests are completely normal, you will receive your results only by: ?MyChart Message (if you have MyChart) OR ?A paper copy in the mail ?If you have any lab test that is abnormal or we need to change your treatment, we will call you to review the results. ? ? ?Testing/Procedures: NONE ORDERED  TODAY ? ? ? ? ?Follow-Up: ?At [facility], you and your health needs are our priority.  As part of our continuing mission to provide you with exceptional heart care, we have created designated Provider Care Teams.  These Care Teams include your primary Cardiologist (physician) and Advanced Practice Providers (APPs -  Physician Assistants and Nurse Practitioners) who all work together to provide you with the care you need, when you need it. ? ?We recommend signing up for the patient portal called "MyChart".  Sign up information is provided on this After Visit Summary.  MyChart is used to connect with patients for Virtual Visits (Telemedicine).  Patients are able to view lab/test results, encounter notes, upcoming appointments, etc.  Non-urgent messages can be sent to your provider as well.   ?To learn more about what you can do with MyChart, go to [email or web address].   ? ?Your next appointment:   ?6 month(s) ? ?The format for your next appointment:   ?In Person ? ?Provider:   ?Tommye Standard, PA-C{ ? ?  ? ? ? ?

## 2021-06-28 ENCOUNTER — Other Ambulatory Visit: Payer: Medicare Other

## 2021-06-28 DIAGNOSIS — E871 Hypo-osmolality and hyponatremia: Secondary | ICD-10-CM

## 2021-06-29 LAB — BASIC METABOLIC PANEL WITH GFR
BUN: 17 mg/dL (ref 7–25)
CO2: 27 mmol/L (ref 20–32)
Calcium: 9.8 mg/dL (ref 8.6–10.4)
Chloride: 98 mmol/L (ref 98–110)
Creat: 0.79 mg/dL (ref 0.60–0.95)
Glucose, Bld: 121 mg/dL (ref 65–139)
Potassium: 4.3 mmol/L (ref 3.5–5.3)
Sodium: 132 mmol/L — ABNORMAL LOW (ref 135–146)
eGFR: 73 mL/min/{1.73_m2} (ref >=60)

## 2021-06-30 ENCOUNTER — Telehealth: Payer: Self-pay

## 2021-06-30 NOTE — Telephone Encounter (Signed)
Yes but she needs to be cautious as that will increase blood pressure- still needs to be on low sodium diet.  ?Her blood sodium is low and this is likely to being on cymbalta with zoloft ?

## 2021-06-30 NOTE — Telephone Encounter (Addendum)
Called and discussed results with patient. Lab recheck scheduled. [name] would like to know if she can eat things with salt?  ?To Stacey Millin, NP to approve. ?

## 2021-06-30 NOTE — Telephone Encounter (Signed)
-----   Message from [name], NP sent at 06/30/2021  2:36 PM EDT ----- ?Sodium level is 132- last level 133- still low but not worse ?Suspect this is due to her starting cymbalta with zoloft- would like to have her come back in and recheck BMP in another 2 weeks to [facility] sure level is stable and does not continue to drop.  ?

## 2021-07-02 ENCOUNTER — Encounter (HOSPITAL_COMMUNITY): Payer: Self-pay

## 2021-07-02 ENCOUNTER — Emergency Department (HOSPITAL_COMMUNITY)
Admission: EM | Admit: 2021-07-02 | Discharge: 2021-07-02 | Disposition: A | Discharge status: Home / Self Care | Payer: Medicare Other | Attending: Emergency Medicine | Admitting: Emergency Medicine

## 2021-07-02 ENCOUNTER — Other Ambulatory Visit: Payer: Self-pay

## 2021-07-02 ENCOUNTER — Emergency Department (HOSPITAL_COMMUNITY): Payer: Medicare Other

## 2021-07-02 DIAGNOSIS — Z79899 Other long term (current) drug therapy: Secondary | ICD-10-CM | POA: Diagnosis not present

## 2021-07-02 DIAGNOSIS — I5022 Chronic systolic (congestive) heart failure: Secondary | ICD-10-CM | POA: Insufficient documentation

## 2021-07-02 DIAGNOSIS — R55 Syncope and collapse: Secondary | ICD-10-CM | POA: Insufficient documentation

## 2021-07-02 DIAGNOSIS — R42 Dizziness and giddiness: Secondary | ICD-10-CM | POA: Diagnosis not present

## 2021-07-02 DIAGNOSIS — I110 Hypertensive heart disease with heart failure: Secondary | ICD-10-CM | POA: Diagnosis not present

## 2021-07-02 DIAGNOSIS — E039 Hypothyroidism, unspecified: Secondary | ICD-10-CM | POA: Insufficient documentation

## 2021-07-02 LAB — BASIC METABOLIC PANEL
Anion gap: 6 (ref 5–15)
BUN: 22 mg/dL (ref 8–23)
CO2: 27 mmol/L (ref 22–32)
Calcium: 10 mg/dL (ref 8.9–10.3)
Chloride: 100 mmol/L (ref 98–111)
Creatinine, Ser: 0.92 mg/dL (ref 0.44–1.00)
GFR, Estimated: >60 mL/min (ref >60)
Glucose, Bld: 120 mg/dL — ABNORMAL HIGH (ref 70–99)
Potassium: 4.2 mmol/L (ref 3.5–5.1)
Sodium: 133 mmol/L — ABNORMAL LOW (ref 135–145)

## 2021-07-02 LAB — TROPONIN I (HIGH SENSITIVITY)
Troponin I (High Sensitivity): 15 ng/L (ref <18)
Troponin I (High Sensitivity): 13 ng/L (ref <18)

## 2021-07-02 LAB — CBC WITH DIFFERENTIAL/PLATELET
Abs Immature Granulocytes: 0.07 10*3/uL (ref 0.00–0.07)
Basophils Absolute: 0.1 10*3/uL (ref 0.0–0.1)
Basophils Relative: 1 %
Eosinophils Absolute: 0.1 10*3/uL (ref 0.0–0.5)
Eosinophils Relative: 2 %
HCT: 35.9 % — ABNORMAL LOW (ref 36.0–46.0)
Hemoglobin: 12.1 g/dL (ref 12.0–15.0)
Immature Granulocytes: 1 %
Lymphocytes Relative: 17 %
Lymphs Abs: 1.2 10*3/uL (ref 0.7–4.0)
MCH: 29.9 pg (ref 26.0–34.0)
MCHC: 33.7 g/dL (ref 30.0–36.0)
MCV: 88.6 fL (ref 80.0–100.0)
Monocytes Absolute: 0.8 10*3/uL (ref 0.1–1.0)
Monocytes Relative: 12 %
Neutro Abs: 4.5 10*3/uL (ref 1.7–7.7)
Neutrophils Relative %: 67 %
Platelets: 145 10*3/uL — ABNORMAL LOW (ref 150–400)
RBC: 4.05 MIL/uL (ref 3.87–5.11)
RDW: 15.4 % (ref 11.5–15.5)
WBC: 6.7 10*3/uL (ref 4.0–10.5)
nRBC: 0 % (ref 0.0–0.2)

## 2021-07-02 LAB — BRAIN NATRIURETIC PEPTIDE: B Natriuretic Peptide: 95.4 pg/mL (ref 0.0–100.0)

## 2021-07-02 LAB — CBG MONITORING, ED: Glucose-Capillary: 99 mg/dL (ref 70–99)

## 2021-07-02 NOTE — ED Provider Notes (Signed)
?[place] ?Provider Note ? ? ?CSN: [id number] ?Arrival date & time: 07/02/21  1021 ? ?  ? ?History ? ?Chief Complaint  ?Patient presents with  ? Loss of Consciousness  ? ? ?[name] is a 88 y.o. female. ? ? ?Loss of Consciousness ? ?Patient is an 88 year old female with a past medical history significant for chronic systolic heart failure, nonischemic cardiomyopathy, LBBB, ICD/CRT-D, breast cancer status postmastectomy, HTN, HLD, hypothyroidism ? ?She is followed by Dr. Lovena Le of cardiology ? ?She presented to emergency room today brought in from home.  She was transported by EMS who provides the back story.  Seems that at approximately 9:05 AM this morning patient was watching TV with her daughter.  Daughter left the room at 12 and return to the room at 9:25 AM and found her mother leaning to the left drooling and not responding.  EMS was called and on their arrival patient's blood pressure was 80/51 and she was responding to painful stimuli but not to words she was seemingly dazed.  Blood pressure improved without intervention to 619 systolic in the ambulance and she was transported without any medication or fluid administration.  Her mental status dramatically improved per EMS and she is now answering questions and interactive. ? ?She denies any chest pain shortness of breath, vertigo ? ?She does endorse some lightheadedness this morning while she was sitting still watching TV.  However she does not feel that she was aware that she was going to pass out.  She denies any mouth pain or tongue injuries she did not pee or poop in her pants. ? ?She has a history of pretty significant heart failure deemed to be nonischemic in 2008 EF was 20% most recent echocardiogram done in 2015 with 45-50% EF ? ?  ? ?Home Medications ?Prior to Admission medications   ?Medication Sig Start Date End Date Taking? Authorizing Provider  ?acetaminophen (TYLENOL) 500 MG tablet Take 500 mg  by mouth in the morning and at bedtime. 500 mg in the morning and afternoon    [provider]  ?acetaminophen (TYLENOL) 650 MG CR tablet Take 650 mg by mouth at bedtime.    [provider]  ?albuterol (VENTOLIN HFA) 108 (90 Base) MCG/ACT inhaler Inhale 2 puffs into the lungs every 6 (six) hours as needed for wheezing or shortness of breath. 01/19/21   Freddi Starr, MD  ?ascorbic acid (VITAMIN C) 500 MG tablet Take 500 mg by mouth daily.    [provider]  ?buPROPion (WELLBUTRIN XL) 150 MG 24 hr tablet Take 1 tablet (150 mg total) by mouth daily. 08/12/20   [name], NP  ?carvedilol (COREG) 3.125 MG tablet TAKE 1 TABLET BY MOUTH  TWICE DAILY 05/25/20   [name], MD  ?Cholecalciferol (VITAMIN D3) 50 MCG (2000 UT) TABS Take 2,000 Units by mouth daily.     [provider]  ?diclofenac Sodium (VOLTAREN) 1 % GEL Apply 4 g topically 4 (four) times daily. 09/18/20   Joy, Shawn C, PA-C  ?DULoxetine (CYMBALTA) 30 MG capsule Take 30 mg by mouth daily. 05/25/21   [provider]  ?famotidine (PEPCID) 10 MG tablet Take 10 mg by mouth as needed for heartburn or indigestion.    [provider]  ?fluticasone (FLONASE) 50 MCG/ACT nasal spray Place 2 sprays into both nostrils daily. 02/04/20   Reed, Tiffany L, DO  ?furosemide (LASIX) 20 MG tablet TAKE 1 TABLET BY MOUTH  DAILY 06/07/21  [name], NP  ?levothyroxine (SYNTHROID, LEVOTHROID) 100 MCG tablet Take 1 tablet (100 mcg total) by mouth daily before breakfast. 05/20/15   [name], DO  ?Lidocaine 4 % PTCH Apply 1 patch topically every 12 (twelve) hours. Apply to back    [provider]  ?lisinopril (ZESTRIL) 5 MG tablet Take 1 tablet (5 mg total) by mouth daily. 06/09/21   [name], NP  ?Magnesium 250 MG TABS Take 250 mg by mouth daily.    [provider]  ?meclizine (ANTIVERT) 12.5 MG tablet Take 1 tablet (12.5 mg total) by mouth 3 (three) times daily as needed for  dizziness. 07/23/19   Suzzanne Cloud, NP  ?NONFORMULARY OR COMPOUNDED ITEM Apply 120 Tubes topically daily. Diclofenac/Cyclobenzaprine/lamotrigine/lidocaine/prilocaine (10480) 2%/2%/6%/5%/1.25% cream ?QTY: 120 GM ?SIG: (NEURO) APPLY 1-2 PUMPS (1-2 GMS) TO AFFECTED AREA (S) OR FOCAL POINTS 3 TO 4 TIMES DAILY. RUB IN FOR 2 MINUTES TO ACHIEVE MAX PENETRATION. Roland HANDS WELL. 05/17/21   [name], NP  ?Omega-3 Fatty Acids (FISH OIL PO) Take 1 capsule by mouth daily.    [provider]  ?omeprazole (PRILOSEC) 40 MG capsule Take 1 capsule (40 mg total) by mouth daily. 04/05/21   [name], NP  ?Oxcarbazepine (TRILEPTAL) 300 MG tablet Take 300 mg by mouth daily.    [provider]  ?polyethylene glycol powder (GLYCOLAX/MIRALAX) 17 GM/SCOOP powder Take 1 Container by mouth as needed.    [provider]  ?pregabalin (LYRICA) 25 MG capsule Take 1 capsule (25 mg total) by mouth 3 (three) times daily. 1 in AM and 2 at night- for back/nerve pain 06/19/21   Lovorn, Jinny Blossom, MD  ?senna-docusate (SENOKOT S) 8.6-50 MG tablet Take 2 tablets by mouth daily. 01/28/20   Reed, Tiffany L, DO  ?sertraline (ZOLOFT) 100 MG tablet Take 200 mg by mouth daily.  02/27/18   [provider]  ?spironolactone (ALDACTONE) 25 MG tablet TAKE 1 TABLET BY MOUTH IN  THE MORNING 04/27/21   [name], NP  ?Vibegron (GEMTESA) 75 MG TABS Take 75 mg by mouth daily. 06/12/21   Bjorn Loser, MD  ?Vitamin E 400 units TABS Take 400 Units by mouth daily.     [provider]  ?   ? ?Allergies    ?Iodine, Shellfish allergy, Memantine, Aspirin, and Codeine   ? ?Review of Systems   ?Review of Systems  ?Cardiovascular:  Positive for syncope.  ? ?Physical Exam ?Updated Vital Signs ?BP 139/68 (BP Location: Right Arm) Comment: Simultaneous filing. User may not have seen previous data.  Pulse 77 Comment: Simultaneous filing. User may not have seen previous data.  Temp (!) 97.4 ?F (36.3 ?C) (Oral)   Resp  18 Comment: Simultaneous filing. User may not have seen previous data.  Ht '4\' 11"'$  (1.499 m)   Wt 53.5 kg   LMP  (LMP Unknown)   SpO2 99% Comment: Simultaneous filing. User may not have seen previous data.  BMI 23.83 kg/m?  ?Physical Exam ?Vitals and nursing note reviewed.  ?Constitutional:   ?   General: She is not in acute distress. ?HENT:  ?   Head: Normocephalic and atraumatic.  ?   Nose: Nose normal.  ?   Mouth/Throat:  ?   Mouth: Mucous membranes are moist.  ?Eyes:  ?   General: No scleral icterus. ?Cardiovascular:  ?   Rate and Rhythm: Normal rate and regular rhythm.  ?   Pulses: Normal pulses.  ?   Heart  sounds: Normal heart sounds.  ?   Comments: ICD in left chest ?Pulmonary:  ?   Effort: Pulmonary effort is normal. No respiratory distress.  ?   Breath sounds: No wheezing.  ?Abdominal:  ?   Palpations: Abdomen is soft.  ?   Tenderness: There is no abdominal tenderness. There is no guarding or rebound.  ?Musculoskeletal:  ?   Cervical back: Normal range of motion.  ?   Right lower leg: No edema.  ?   Left lower leg: No edema.  ?Skin: ?   General: Skin is warm and dry.  ?   Capillary Refill: Capillary refill takes less than 2 seconds.  ?Neurological:  ?   Mental Status: She is alert. Mental status is at baseline.  ?Psychiatric:     ?   Mood and Affect: Mood normal.     ?   Behavior: Behavior normal.  ? ? ?ED Results / Procedures / Treatments   ?Labs ?(all labs ordered are listed, but only abnormal results are displayed) ?Labs Reviewed  ?CBC WITH DIFFERENTIAL/PLATELET - Abnormal; Notable for the following components:  ?    Result Value  ? HCT 35.9 (*)   ? Platelets 145 (*)   ? All other components within normal limits  ?BASIC METABOLIC PANEL - Abnormal; Notable for the following components:  ? Sodium 133 (*)   ? Glucose, Bld 120 (*)   ? All other components within normal limits  ?BRAIN NATRIURETIC PEPTIDE  ?CBG MONITORING, ED  ?TROPONIN I (HIGH SENSITIVITY)  ?TROPONIN I (HIGH SENSITIVITY)  ? ? ?EKG ?EKG  Interpretation ? ?Date/Time:  Sunday July 02 2021 10:36:54 EDT ?Ventricular Rate:  66 ?PR Interval:  132 ?QRS Duration: 142 ?QT Interval:  460 ?QTC Calculation: 482 ?R Axis:   -69 ?Text Interpretation: ATRIAL

## 2021-07-02 NOTE — Discharge Instructions (Signed)
Please follow-up with your primary care provider as well as your cardiologist.  Please make sure you are drinking plenty of water and I would recommend scheduling an appointment with your primary care doctor entirely devoted to discussing the large number of medications ?And seeing if any of these medications could be removed from your prescription list.  This may not be possible however it is worth a try.  Return to the emergency room for any new or concerning symptoms. ?

## 2021-07-02 NOTE — ED Notes (Signed)
Lying: 130/66 ?              62 ?Sitting: 116/73 ?              70 ?Standing: 123/64  ?                 70 ?

## 2021-07-02 NOTE — ED Triage Notes (Signed)
[facility] reports pt coming from home and had a syncopal episode while sitting in chair. Upon fire arrival pt had no radial pulses. Upon EMS arrival pt CAO. BP 80/51. Denies any pain or discomfort. ?

## 2021-07-02 NOTE — ED Notes (Signed)
Pt ambulated in the room and was slower than normal per daughter and pt stated she did feel a little lightheaded while walking. ?

## 2021-07-04 NOTE — Telephone Encounter (Signed)
Discussed with the patient.

## 2021-07-07 ENCOUNTER — Encounter: Payer: Self-pay | Admitting: Nurse Practitioner

## 2021-07-07 ENCOUNTER — Other Ambulatory Visit: Payer: Self-pay | Admitting: Nurse Practitioner

## 2021-07-07 ENCOUNTER — Ambulatory Visit (INDEPENDENT_AMBULATORY_CARE_PROVIDER_SITE_OTHER): Payer: Medicare Other | Admitting: Nurse Practitioner

## 2021-07-07 VITALS — BP 124/70 | HR 66 | Temp 97.1°F | Ht 59.0 in | Wt 118.0 lb

## 2021-07-07 DIAGNOSIS — E871 Hypo-osmolality and hyponatremia: Secondary | ICD-10-CM | POA: Diagnosis not present

## 2021-07-07 DIAGNOSIS — I10 Essential (primary) hypertension: Secondary | ICD-10-CM

## 2021-07-07 DIAGNOSIS — F3181 Bipolar II disorder: Secondary | ICD-10-CM

## 2021-07-07 DIAGNOSIS — M5442 Lumbago with sciatica, left side: Secondary | ICD-10-CM

## 2021-07-07 DIAGNOSIS — I5022 Chronic systolic (congestive) heart failure: Secondary | ICD-10-CM | POA: Diagnosis not present

## 2021-07-07 DIAGNOSIS — R55 Syncope and collapse: Secondary | ICD-10-CM | POA: Diagnosis not present

## 2021-07-07 DIAGNOSIS — G8929 Other chronic pain: Secondary | ICD-10-CM

## 2021-07-07 DIAGNOSIS — M5441 Lumbago with sciatica, right side: Secondary | ICD-10-CM

## 2021-07-07 NOTE — Progress Notes (Signed)
? ? ?Careteam: ?Patient Care Team: ?[name], NP as PCP - General (Geriatric Medicine) ?[name], MD as Consulting Physician (General Surgery) ?Paralee Cancel, MD as Consulting Physician (Orthopedic Surgery) ?Marica Otter, OD (Optometry) ?Marcial Pacas, MD as Consulting Physician (Neurology) ?[name], MD as Consulting Physician (Cardiology) ?Ricard Dillon, MD (Psychiatry) ? ?PLACE OF SERVICE:  ?[facility]  ?Advanced Directive information ?Does Patient Have a Medical Advance Directive?: Yes, Type of Advance Directive: Living will;Out of facility DNR (pink MOST or yellow form), Pre-existing out of facility DNR order (yellow form or pink MOST form): Yellow form placed in chart (order not valid for inpatient use), Does patient want to make changes to medical advance directive?: No - Patient declined ? ?Allergies  ?Allergen Reactions  ? Iodine Shortness Of Breath  ?  Iodine contrast, CHF , SOB  ? Shellfish Allergy Shortness Of Breath  ? Memantine   ?  Malaise, fogginess/ couldn't think  ? Aspirin Nausea And Vomiting  ? Codeine Nausea And Vomiting  ? ? ?Chief Complaint  ?Patient presents with  ? Follow-up  ?  ER follow-up from syncope episode. Patient c/o retaining fluid and ongoing foot pain. Refill lisinopril at [facility] (90 day supply requested)  ? ? ? ?HPI: Patient is a 88 y.o. female for ED follow up.  ?Daughter starts she was sitting up in the chair but unresponsive. She was locked in place. Daughter could not move her. She could not tell that she was even breathing. Did see her abdomin moving.  ?EMS gave her oxygen. Full workup in the ED that could not find cause. Feeling "drooping" now. Poor energy level.  ?Less balance.  ?Cymbalta has made a significant difference in pain.  ?Daughter says she has had a lot of medication changes. Recently between ortho, pain management, psych. She has decreased from 200 of zoloft to 100 mg.  ?She had an increase in trileptal from 600 mg to 300 mg  then that was too much now back to 300 mg.  ? ?So far mood has been stable. She feels faint this morning. Did not eat a full breakfast.  ?No N/V.  ?No diarrhea.  ? ?Daughter reports the doctors there focus on cardiac origin but ruled that out. Lab without acute abnormality.  ? ?During visit pt reports increase fatigue and feeling off, she started to droop from the mouth and became more lethargic.  ?Blood pressure was checked and 70/40, EMS called as her mental status decreased.  ?O2 was applied as her oxygen level dropped into the 70s.  ?She was responsive to painful stimuli ONLY and continue to breath regularly. HR was at 55 with carotid pulse maintaining.  ? ?After about 10 mins she regained consciousness and was A&O x4. EMS shortly arrived.  ?She bp remained low with HR of 55. EKG showed pacing at 55 without acute changes.  ?NS bolus was given.  ? ?Of note daughter reports event that happened 5 days ago happened at same time of day and very similar event except today she was not as stiff/rigid and episode was much shorter.  ? ? ?Review of Systems:  ?Review of Systems  ?Constitutional:  Negative for chills, fever and weight loss.  ?HENT:  Negative for tinnitus.   ?Respiratory:  Negative for cough, sputum production and shortness of breath.   ?Cardiovascular:  Negative for chest pain, palpitations and leg swelling.  ?Gastrointestinal:  Negative for abdominal pain, constipation, diarrhea and heartburn.  ?Genitourinary:  Negative for dysuria,  frequency and urgency.  ?Musculoskeletal:  Negative for back pain, falls, joint pain and myalgias.  ?Skin: Negative.   ?Neurological:  Negative for dizziness and headaches.  ?Psychiatric/Behavioral:  Negative for depression and memory loss. The patient does not have insomnia.   ? ?Past Medical History:  ?Diagnosis Date  ? Arthritis   ? Cancer [facility])   ? left breast cancer   ? Cataracts, bilateral   ? removed by surgery  ? CHF (congestive heart failure) ([place])   ? PACEMAKER & DEFIB   ? Complication of anesthesia   ? hypotensive after back surgery in 2006  ? Depression   ? Dyslipidemia   ? Fainted 04/21/06  ? AT [facility]  ? GERD (gastroesophageal reflux disease)   ? Headache(784.0)   ? Hearing loss   ? bilateral hearing aids  ? HLD (hyperlipidemia)   ? diet controlled   ? Hypertension   ? Hypothyroidism   ? ICD (implantable cardiac defibrillator) in place   ? pt has pacer/icd  ? ICD (implantable cardiac defibrillator), biventricular, in situ   ? LBBB (left bundle branch block)   ? Memory loss   ? Nonischemic cardiomyopathy ([place])   ? Normal coronary arteries   ? s/p cardiac cath 2007  ? Pacemaker   ? ICD [employer]  ? Syncope   ? Systolic CHF ([place])   ? Vertigo   ? Wears glasses   ? ?Past Surgical History:  ?Procedure Laterality Date  ? BACK SURGERY    ? lumbar fusion   ? BREAST LUMPECTOMY Left 2008  ? BREAST SURGERY  2000  ? LUMP REMOVAL. STAGE 1 CANCER  ? CARDIAC CATHETERIZATION    ? CATARACT EXTRACTION    ? COLONOSCOPY    ? EYE SURGERY    ? IMPLANTABLE CARDIOVERTER DEFIBRILLATOR GENERATOR CHANGE N/A 12/18/2012  ? Procedure: IMPLANTABLE CARDIOVERTER DEFIBRILLATOR GENERATOR CHANGE;  Surgeon: [name], MD;  Location[facility] CATH LAB;  Service: Cardiovascular;  Laterality: N/A;  ? JOINT REPLACEMENT  06/14/01  ? right  ? LUMBAR FUSION  2006  ? MASS EXCISION  11/08/2011  ? Procedure: EXCISION MASS;  Surgeon: [name], MD;  Location: [facility] ORS;  Service: General;  Laterality: Left;  Excision Left Thigh Mass  ? MASTECTOMY W/ SENTINEL NODE BIOPSY Left 06/04/2019  ? Procedure: LEFT MASTECTOMY WITH SENTINEL LYMPH NODE BIOPSY;  Surgeon: [name], MD;  Location: [facility];  Service: General;  Laterality: Left;  ? MASTECTOMY, PARTIAL  2008  ? GOT PACEMAKER AND DEFIB AT THAT TIME  ? PACEMAKER INSERTION  04/23/06  ? TOTAL KNEE ARTHROPLASTY  05/17/01  ? RIGHT KNEE  ? TOTAL KNEE ARTHROPLASTY Left 11/29/2014  ? Procedure: TOTAL LEFT KNEE ARTHROPLASTY;  Surgeon: Paralee Cancel, MD;  Location: [facility] ORS;  Service:  Orthopedics;  Laterality: Left;  ? ?Social History: ?  reports that she has never smoked. She has never used smokeless tobacco. She reports that she does not drink alcohol and does not use drugs. ? ?Family History  ?Problem Relation Age of Onset  ? Hypertension Mother   ? Arthritis Mother   ? Hypertension Father   ? Hypertension Brother   ? Hypertension Brother   ? ? ?Medications: ?Patient's Medications  ?New Prescriptions  ? No medications on file  ?Previous Medications  ? ACETAMINOPHEN (TYLENOL) 500 MG TABLET    Take 500 mg by mouth in the morning and at bedtime. 500 mg in the morning and afternoon  ? ACETAMINOPHEN (TYLENOL) 650 MG CR TABLET  Take 650 mg by mouth at bedtime.  ? ALBUTEROL (VENTOLIN HFA) 108 (90 BASE) MCG/ACT INHALER    Inhale 2 puffs into the lungs every 6 (six) hours as needed for wheezing or shortness of breath.  ? ASCORBIC ACID (VITAMIN C) 500 MG TABLET    Take 500 mg by mouth daily.  ? BUPROPION (WELLBUTRIN XL) 150 MG 24 HR TABLET    Take 1 tablet (150 mg total) by mouth daily.  ? CARVEDILOL (COREG) 3.125 MG TABLET    TAKE 1 TABLET BY MOUTH  TWICE DAILY  ? CHOLECALCIFEROL (VITAMIN D3) 50 MCG (2000 UT) TABS    Take 2,000 Units by mouth daily.   ? DICLOFENAC SODIUM (VOLTAREN) 1 % GEL    Apply 4 g topically 4 (four) times daily.  ? DULOXETINE (CYMBALTA) 30 MG CAPSULE    Take 30 mg by mouth daily.  ? FAMOTIDINE (PEPCID) 10 MG TABLET    Take 10 mg by mouth as needed for heartburn or indigestion.  ? FLUTICASONE (FLONASE) 50 MCG/ACT NASAL SPRAY    Place 2 sprays into both nostrils daily.  ? FUROSEMIDE (LASIX) 20 MG TABLET    TAKE 1 TABLET BY MOUTH  DAILY  ? LEVOTHYROXINE (SYNTHROID, LEVOTHROID) 100 MCG TABLET    Take 1 tablet (100 mcg total) by mouth daily before breakfast.  ? LIDOCAINE 4 % PTCH    Apply 1 patch topically every 12 (twelve) hours. Apply to back  ? LISINOPRIL (ZESTRIL) 5 MG TABLET    Take 1 tablet (5 mg total) by mouth daily.  ? MAGNESIUM 250 MG TABS    Take 250 mg by mouth daily.  ?  MECLIZINE (ANTIVERT) 12.5 MG TABLET    Take 1 tablet (12.5 mg total) by mouth 3 (three) times daily as needed for dizziness.  ? NONFORMULARY OR COMPOUNDED ITEM    Apply 120 Tubes topically daily. Diclofenac/Cy

## 2021-07-08 LAB — COMPLETE METABOLIC PANEL WITH GFR
AG Ratio: 1.4 (calc) (ref 1.0–2.5)
ALT: 18 U/L (ref 6–29)
AST: 29 U/L (ref 10–35)
Albumin: 4.2 g/dL (ref 3.6–5.1)
Alkaline phosphatase (APISO): 61 U/L (ref 37–153)
BUN: 22 mg/dL (ref 7–25)
CO2: 25 mmol/L (ref 20–32)
Calcium: 9.7 mg/dL (ref 8.6–10.4)
Chloride: 98 mmol/L (ref 98–110)
Creat: 0.82 mg/dL (ref 0.60–0.95)
Globulin: 3 g/dL (calc) (ref 1.9–3.7)
Glucose, Bld: 84 mg/dL (ref 65–99)
Potassium: 5 mmol/L (ref 3.5–5.3)
Sodium: 132 mmol/L — ABNORMAL LOW (ref 135–146)
Total Bilirubin: 0.4 mg/dL (ref 0.2–1.2)
Total Protein: 7.2 g/dL (ref 6.1–8.1)
eGFR: 70 mL/min/{1.73_m2} (ref >=60)

## 2021-07-08 LAB — CBC WITH DIFFERENTIAL/PLATELET
Absolute Monocytes: 583 cells/uL (ref 200–950)
Basophils Absolute: 90 cells/uL (ref 0–200)
Basophils Relative: 1.7 %
Eosinophils Absolute: 159 cells/uL (ref 15–500)
Eosinophils Relative: 3 %
HCT: 38.1 % (ref 35.0–45.0)
Hemoglobin: 12.5 g/dL (ref 11.7–15.5)
Lymphs Abs: 1124 cells/uL (ref 850–3900)
MCH: 29.1 pg (ref 27.0–33.0)
MCHC: 32.8 g/dL (ref 32.0–36.0)
MCV: 88.8 fL (ref 80.0–100.0)
MPV: 12.6 fL — ABNORMAL HIGH (ref 7.5–12.5)
Monocytes Relative: 11 %
Neutro Abs: 3344 cells/uL (ref 1500–7800)
Neutrophils Relative %: 63.1 %
Platelets: 143 10*3/uL (ref 140–400)
RBC: 4.29 10*6/uL (ref 3.80–5.10)
RDW: 13.8 % (ref 11.0–15.0)
Total Lymphocyte: 21.2 %
WBC: 5.3 10*3/uL (ref 3.8–10.8)

## 2021-07-11 ENCOUNTER — Telehealth: Payer: Self-pay | Admitting: Neurology

## 2021-07-11 ENCOUNTER — Telehealth: Payer: Self-pay | Admitting: Physician Assistant

## 2021-07-11 NOTE — Telephone Encounter (Signed)
Pt called no answer left a voice mail to call the office back  °

## 2021-07-11 NOTE — Telephone Encounter (Signed)
Low BP can cause her passing out. I will be out of the office the next 2 weeks. Ok to schedule with Clarise Cruz, continue with increased hydration and monitor BP, thanks ?

## 2021-07-11 NOTE — Telephone Encounter (Signed)
Patient said she is returning a call to heather ?

## 2021-07-11 NOTE — Telephone Encounter (Signed)
Patient said her PCP would like aquino to see her ASAP due to her having passing out spells. Her next appt isnt until 04/2022 ?

## 2021-07-11 NOTE — Telephone Encounter (Signed)
Pt called an informed Low BP can cause her passing out. Dr Delice Lesch  will be out of the office the next 2 weeks. Ok to schedule with Clarise Cruz, continue with increased hydration and monitor BP ?

## 2021-07-11 NOTE — Telephone Encounter (Signed)
See other phone note

## 2021-07-17 ENCOUNTER — Other Ambulatory Visit: Payer: Medicare Other

## 2021-07-19 ENCOUNTER — Other Ambulatory Visit: Payer: Self-pay | Admitting: Internal Medicine

## 2021-07-25 ENCOUNTER — Encounter: Payer: Self-pay | Admitting: Physician Assistant

## 2021-07-25 ENCOUNTER — Ambulatory Visit (INDEPENDENT_AMBULATORY_CARE_PROVIDER_SITE_OTHER): Payer: Medicare Other | Admitting: Physician Assistant

## 2021-07-25 VITALS — BP 128/69 | HR 96 | Resp 20 | Ht <= 58 in | Wt 118.0 lb

## 2021-07-25 DIAGNOSIS — R4182 Altered mental status, unspecified: Secondary | ICD-10-CM | POA: Diagnosis not present

## 2021-07-25 NOTE — Patient Instructions (Signed)
PLease feel free to call Arnell Asal, Social Worker at [employer] at [phone]  ?Also, you can call [facility] [phone] for guidance  ?EEG rule out seizures  ?Control BP and increase water intake ?Follow up with your other specialist  ?Follow in 6 months  ? ? ?

## 2021-07-25 NOTE — Progress Notes (Addendum)
? ?NEUROLOGY FOLLOW UP OFFICE NOTE ? ?[employer] ?[id number] ?1933/06/21 ? ?HISTORY OF PRESENT ILLNESS: ?I had the pleasure of seeing [name] in follow-up in the neurology clinic on 05/05/2021.  The patient was last seen 6 months ago for mild dementia likely due to Alzheimer's disease. She is again accompanied by her daughter [name] who helps supplement the history today.  Records and images were personally reviewed where available.She has tried several dementia medications but did not tolerate them (Donepezil, Rivastigmine, Memantine). Since her last visit, she reports her memory is about the same, although she has more lapses, such as trying to remember things, recalling names or finding words.  Sometimes she has a hard time comprehending "I do not know what I was talking about, and it frustrates me ".  She has some good and bad days.  She has continued to have anxiety, and she focuses on her chronic pain frequently.  She takes multiple medications for pain and for psychiatric issues.  Her daughter assists her on the medications.  She lives with [name] who manages her finances.  She does not drive or cook. She is independent with dressing and bathing.  She has some urine incontinence and wears depends.  No recent falls.  Recently, during Sunday school, she was attentive, but within a 20-minute period,  She "slumped, drool was coming out of her mouth, and if she was not responsive.  Her daughter was afraid that she was not breathing.  EMS came, and her head was tilted and "locked ".  She had an oxygen mask, able to "come up and respond ".  At theED MI was ruled out, and the pacemaker was functioning well.  It was noted that her blood pressure was very low.  On June 25, 2022 of the same week, her daughter reported that out of the blue, she was very sleepy, tired, and then became unconscious in a "locked position, with the blood pressure dropping again, the EMS could not read the pulse ".  She was "revived ".   It is unclear of the etiology of these.  She denies any history of seizures, taste of blood or metallic, jaw pain, tongue bleeding or biting, tremors.  She has an appointment with her cardiology soon, and she recently had an appointment with her psychiatrist. ? ?History on Initial Assessment 10/13/2020: This is an 88 year old right-handed woman with a history of hypertension, hyperlipidemia, hypothyroidism, LBBB s/p ICD placement, breast cancer s/p mastectomy, chronic back pain, bipolar depression, anxiety, presenting for evaluation of memory loss. She had been following with [facility] for memory loss since 06/25/14, records were reviewed. She started noticing mild short-term memory changes in 06/25/02 after lumbar surgery. Over the years, memory changes have progressed, her daughter moved in with her in 25-Jun-2019 and she stopped driving. She had side effects on Donepezil, Rivastigmine, and Memantine.  ? ?She states her memory is "terrible." [name] reports memory changes became more noticeable after her husband passed away in 06/25/18. She had left the stove on a couple of times and left keys outside the door. She would forget what she was going to say and forget conversations from the day prior. She would repeat herself at times. She manages her own medications, [name] checks behind her. Her husband was managing finances, [name] took over when he passed away. She stopped driving in Jan/Feb 6384 because she was having staggering gait. She is independent with dressing and bathing. Notes from [facility] indicate she was tried on Donepezil  in 2017, Memantine in 2018, Rivastigmine in 2019. She felt "like a mummy, could not think."  ? ?She denies any headaches, diplopia, dysarthria/dysphagia, neck pain, focal numbness/tingling/weakness, anosmia. She has chronic back pain and constipation. She has occasional dizziness. There are tremors in both hands affecting handwriting. Sleep is okay, she usually sleeps 8 hours at night. Her last fall  was in January 2022 when she fell off the bed. She had worked with physical therapy and staggering gait has stabilized. Hurley is "sometimes stable," she is more irritable with herself when she could not remember things. [name] notices she is more anxious when she has to leave. No hallucinations or paranoia. No family history of dementia, history of significant head injuries, or alcohol use.  ? ? ?Laboratory Data: ?Lab Results  ?Component Value Date  ? TSH 0.67 11/17/2020  ? ?Lab Results  ?Component Value Date  ? [id number] 1,117 (H) 07/28/2014  ? ? ?I personally reviewed head CT without contrast done 07/2019 which did not show any acute changes. There was mild chronic microvascular disease and mild diffuse atrophy that have slightly progressed compared to 2016 imaging.  ? ? ?PAST MEDICAL HISTORY: ?Past Medical History:  ?Diagnosis Date  ? Arthritis   ? Cancer [facility])   ? left breast cancer   ? Cataracts, bilateral   ? removed by surgery  ? CHF (congestive heart failure) ([place])   ? PACEMAKER & DEFIB  ? Complication of anesthesia   ? hypotensive after back surgery in 2006  ? Depression   ? Dyslipidemia   ? Fainted 04/21/06  ? AT [facility]  ? GERD (gastroesophageal reflux disease)   ? Headache(784.0)   ? Hearing loss   ? bilateral hearing aids  ? HLD (hyperlipidemia)   ? diet controlled   ? Hypertension   ? Hypothyroidism   ? ICD (implantable cardiac defibrillator) in place   ? pt has pacer/icd  ? ICD (implantable cardiac defibrillator), biventricular, in situ   ? LBBB (left bundle branch block)   ? Memory loss   ? Nonischemic cardiomyopathy ([place])   ? Normal coronary arteries   ? s/p cardiac cath 2007  ? Pacemaker   ? ICD [employer]  ? Syncope   ? Systolic CHF ([place])   ? Vertigo   ? Wears glasses   ? ? ?MEDICATIONS: ?Current Outpatient Medications on File Prior to Visit  ?Medication Sig Dispense Refill  ? acetaminophen (TYLENOL) 500 MG tablet Take 500 mg by mouth in the morning and at bedtime. 500 mg in the morning and  afternoon    ? acetaminophen (TYLENOL) 650 MG CR tablet Take 650 mg by mouth at bedtime.    ? albuterol (VENTOLIN HFA) 108 (90 Base) MCG/ACT inhaler Inhale 2 puffs into the lungs every 6 (six) hours as needed for wheezing or shortness of breath. 8 g 6  ? ascorbic acid (VITAMIN C) 500 MG tablet Take 500 mg by mouth daily.    ? buPROPion (WELLBUTRIN XL) 150 MG 24 hr tablet Take 1 tablet (150 mg total) by mouth daily.    ? carvedilol (COREG) 3.125 MG tablet TAKE 1 TABLET BY MOUTH  TWICE DAILY 180 tablet 3  ? Cholecalciferol (VITAMIN D3) 50 MCG (2000 UT) TABS Take 2,000 Units by mouth daily.     ? diclofenac Sodium (VOLTAREN) 1 % GEL Apply 4 g topically 4 (four) times daily. 100 g 2  ? DULoxetine (CYMBALTA) 30 MG capsule Take 30 mg by mouth daily.    ?  famotidine (PEPCID) 10 MG tablet Take 10 mg by mouth as needed for heartburn or indigestion.    ? fluticasone (FLONASE) 50 MCG/ACT nasal spray Place 2 sprays into both nostrils daily. 16 g 3  ? levothyroxine (SYNTHROID, LEVOTHROID) 100 MCG tablet Take 1 tablet (100 mcg total) by mouth daily before breakfast. 90 tablet 3  ? Lidocaine 4 % PTCH Apply 1 patch topically every 12 (twelve) hours. Apply to back    ? lisinopril (ZESTRIL) 5 MG tablet Take 1 tablet (5 mg total) by mouth daily. 30 tablet 1  ? Magnesium 250 MG TABS Take 250 mg by mouth daily.    ? meclizine (ANTIVERT) 12.5 MG tablet Take 1 tablet (12.5 mg total) by mouth 3 (three) times daily as needed for dizziness. 30 tablet 1  ? NONFORMULARY OR COMPOUNDED ITEM Apply 120 Tubes topically daily. Diclofenac/Cyclobenzaprine/lamotrigine/lidocaine/prilocaine (10480) 2%/2%/6%/5%/1.25% cream ?QTY: 120 GM ?SIG: (NEURO) APPLY 1-2 PUMPS (1-2 GMS) TO AFFECTED AREA (S) OR FOCAL POINTS 3 TO 4 TIMES DAILY. RUB IN FOR 2 MINUTES TO ACHIEVE MAX PENETRATION. [place] HANDS WELL. 1 each 2  ? Omega-3 Fatty Acids (FISH OIL PO) Take 1 capsule by mouth daily.    ? omeprazole (PRILOSEC) 40 MG capsule Take 1 capsule (40 mg total) by mouth daily.  90 capsule 1  ? Oxcarbazepine (TRILEPTAL) 300 MG tablet Take 300 mg by mouth daily.    ? polyethylene glycol powder (GLYCOLAX/MIRALAX) 17 GM/SCOOP powder Take 1 Container by mouth daily.    ? pregabalin

## 2021-07-26 ENCOUNTER — Other Ambulatory Visit: Payer: Self-pay | Admitting: Nurse Practitioner

## 2021-07-26 DIAGNOSIS — I10 Essential (primary) hypertension: Secondary | ICD-10-CM

## 2021-07-26 NOTE — Telephone Encounter (Signed)
High warning came up when trying to refill medication. Medication pended and sent to Jessica Eubanks, NP for approval. 

## 2021-07-26 NOTE — Progress Notes (Signed)
? ? ?Electrophysiology Office Note ?Date: 08/02/2021 ? ?ID:  [name], DOB 09/21/1933, MRN [id number] ? ?PCP: [name], NP ?Primary Cardiologist: None ?Electrophysiologist: Cristopher Peru, MD  ? ?CC: Pacemaker follow-up ? ?[name] is a 88 y.o. female seen today for Cristopher Peru, MD for post hospital follow up.  ? ?Seen in ED last month for syncopal episode. Nothing noted on device. Was found to have hypotension by EMS. Work up was unremarkable and she had no further symptoms in the ED.  ? ? Since last being seen in our clinic the patient reports doing OK. She had further syncope 07/07/2021 in PCP office, was found to be profoundly hypotensive. Lisinopril held, but ultimately resumed with HTN. She has been doing OK since then. She has noted swelling in her ankles over the past several weeks. Not taking lasix anymore.  she denies chest pain, palpitations, dyspnea, PND, orthopnea, nausea, vomiting, dizziness, edema, weight gain, or early satiety. ? ?Device History: ?BSCi CRT-D implanted 04/24/2006, gen change 12/18/2012 ? ?Past Medical History:  ?Diagnosis Date  ? Arthritis   ? Cancer [facility])   ? left breast cancer   ? Cataracts, bilateral   ? removed by surgery  ? CHF (congestive heart failure) ([place])   ? PACEMAKER & DEFIB  ? Complication of anesthesia   ? hypotensive after back surgery in 2006  ? Depression   ? Dyslipidemia   ? Fainted 04/21/06  ? AT [facility]  ? GERD (gastroesophageal reflux disease)   ? Headache(784.0)   ? Hearing loss   ? bilateral hearing aids  ? HLD (hyperlipidemia)   ? diet controlled   ? Hypertension   ? Hypothyroidism   ? ICD (implantable cardiac defibrillator) in place   ? pt has pacer/icd  ? ICD (implantable cardiac defibrillator), biventricular, in situ   ? LBBB (left bundle branch block)   ? Memory loss   ? Nonischemic cardiomyopathy ([place])   ? Normal coronary arteries   ? s/p cardiac cath 2007  ? Pacemaker   ? ICD [employer]  ? Syncope   ? Systolic CHF ([place])   ?  Vertigo   ? Wears glasses   ? ?Past Surgical History:  ?Procedure Laterality Date  ? BACK SURGERY    ? lumbar fusion   ? BREAST LUMPECTOMY Left 2008  ? BREAST SURGERY  2000  ? LUMP REMOVAL. STAGE 1 CANCER  ? CARDIAC CATHETERIZATION    ? CATARACT EXTRACTION    ? COLONOSCOPY    ? EYE SURGERY    ? IMPLANTABLE CARDIOVERTER DEFIBRILLATOR GENERATOR CHANGE N/A 12/18/2012  ? Procedure: IMPLANTABLE CARDIOVERTER DEFIBRILLATOR GENERATOR CHANGE;  Surgeon: [name], MD;  Location: [facility] CATH LAB;  Service: Cardiovascular;  Laterality: N/A;  ? JOINT REPLACEMENT  06/14/01  ? right  ? LUMBAR FUSION  2006  ? MASS EXCISION  11/08/2011  ? Procedure: EXCISION MASS;  Surgeon: [name], MD;  Location: [facility] ORS;  Service: General;  Laterality: Left;  Excision Left Thigh Mass  ? MASTECTOMY W/ SENTINEL NODE BIOPSY Left 06/04/2019  ? Procedure: LEFT MASTECTOMY WITH SENTINEL LYMPH NODE BIOPSY;  Surgeon: [name], MD;  Location: [facility];  Service: General;  Laterality: Left;  ? MASTECTOMY, PARTIAL  2008  ? GOT PACEMAKER AND DEFIB AT THAT TIME  ? PACEMAKER INSERTION  04/23/06  ? TOTAL KNEE ARTHROPLASTY  05/17/01  ? RIGHT KNEE  ? TOTAL KNEE ARTHROPLASTY Left 11/29/2014  ? Procedure: TOTAL LEFT KNEE ARTHROPLASTY;  Surgeon: Paralee Cancel,  MD;  Location: [facility] ORS;  Service: Orthopedics;  Laterality: Left;  ? ? ?Current Outpatient Medications  ?Medication Sig Dispense Refill  ? furosemide (LASIX) 20 MG tablet Take 1 tablet (20 mg total) by mouth daily as needed. 90 tablet 3  ? pregabalin (LYRICA) 25 MG capsule Take 1 capsule (25 mg total) by mouth 3 (three) times daily. 1 in AM and 2 at night- for back/nerve pain (Patient taking differently: Take 25 mg by mouth 2 (two) times daily. 1 in AM and 1 at night- for back/nerve pain) 90 capsule 5  ? acetaminophen (TYLENOL) 500 MG tablet Take 500 mg by mouth in the morning and at bedtime. 500 mg in the morning and afternoon    ? acetaminophen (TYLENOL) 650 MG CR tablet Take 650 mg by mouth at bedtime.    ?  albuterol (VENTOLIN HFA) 108 (90 Base) MCG/ACT inhaler Inhale 2 puffs into the lungs every 6 (six) hours as needed for wheezing or shortness of breath. 8 g 6  ? ascorbic acid (VITAMIN C) 500 MG tablet Take 500 mg by mouth daily.    ? buPROPion (WELLBUTRIN XL) 150 MG 24 hr tablet Take 1 tablet (150 mg total) by mouth daily.    ? carvedilol (COREG) 3.125 MG tablet TAKE 1 TABLET BY MOUTH  TWICE DAILY 180 tablet 3  ? Cholecalciferol (VITAMIN D3) 50 MCG (2000 UT) TABS Take 2,000 Units by mouth daily.     ? diclofenac Sodium (VOLTAREN) 1 % GEL Apply 4 g topically 4 (four) times daily. 100 g 2  ? DULoxetine (CYMBALTA) 60 MG capsule Take 60 mg by mouth daily.    ? famotidine (PEPCID) 10 MG tablet Take 10 mg by mouth as needed for heartburn or indigestion.    ? fluticasone (FLONASE) 50 MCG/ACT nasal spray Place 2 sprays into both nostrils daily. 16 g 3  ? levothyroxine (SYNTHROID, LEVOTHROID) 100 MCG tablet Take 1 tablet (100 mcg total) by mouth daily before breakfast. 90 tablet 3  ? Lidocaine 4 % PTCH Apply 1 patch topically every 12 (twelve) hours. Apply to back    ? lisinopril (ZESTRIL) 5 MG tablet Take 1 tablet (5 mg total) by mouth at bedtime. 30 tablet 5  ? Magnesium 250 MG TABS Take 250 mg by mouth daily.    ? meclizine (ANTIVERT) 12.5 MG tablet Take 1 tablet (12.5 mg total) by mouth 3 (three) times daily as needed for dizziness. 30 tablet 1  ? NONFORMULARY OR COMPOUNDED ITEM Apply 120 Tubes topically daily. Diclofenac/Cyclobenzaprine/lamotrigine/lidocaine/prilocaine (10480) 2%/2%/6%/5%/1.25% cream ?QTY: 120 GM ?SIG: (NEURO) APPLY 1-2 PUMPS (1-2 GMS) TO AFFECTED AREA (S) OR FOCAL POINTS 3 TO 4 TIMES DAILY. RUB IN FOR 2 MINUTES TO ACHIEVE MAX PENETRATION. [place] HANDS WELL. 1 each 2  ? Omega-3 Fatty Acids (FISH OIL PO) Take 1 capsule by mouth daily.    ? omeprazole (PRILOSEC) 40 MG capsule Take 1 capsule (40 mg total) by mouth daily. 90 capsule 1  ? Oxcarbazepine (TRILEPTAL) 300 MG tablet Take 300 mg by mouth daily.    ?  polyethylene glycol powder (GLYCOLAX/MIRALAX) 17 GM/SCOOP powder Take 1 Container by mouth daily.    ? senna-docusate (SENOKOT S) 8.6-50 MG tablet Take 2 tablets by mouth daily. 60 tablet 5  ? spironolactone (ALDACTONE) 25 MG tablet TAKE 1 TABLET BY MOUTH IN  THE MORNING 90 tablet 2  ? Vibegron (GEMTESA) 75 MG TABS Take 75 mg by mouth daily. 30 tablet 11  ? Vitamin E 400 units TABS  Take 400 Units by mouth daily.     ? ?No current facility-administered medications for this visit.  ? ? ?Allergies:   Iodine, Shellfish allergy, Memantine, Aspirin, and Codeine  ? ?Social History: ?Social History  ? ?Socioeconomic History  ? Marital status: Married  ?  Spouse name: Not on file  ? Number of children: 1  ? Years of education: Masters  ? Highest education level: Not on file  ?Occupational History  ? Occupation: Retired  ?Tobacco Use  ? Smoking status: Never  ? Smokeless tobacco: Never  ?Vaping Use  ? Vaping Use: Never used  ?Substance and Sexual Activity  ? Alcohol use: No  ? Drug use: No  ? Sexual activity: Not Currently  ?  Birth control/protection: Post-menopausal  ?Other Topics Concern  ? Not on file  ?Social History Narrative  ? Lives at home with husband.  ? Right-handed.  ?   ? As of 07/28/2014  ? Diet: No special diet  ? Caffeine: yes, Chocolate, tea and sodas   ? Married: YES, 1970  ? House: Yes, 2 stories, 2-3 persons live in home  ? Pets: No  ? Current/Past profession: Engineer, mining, Nurse Practitioner   ? Exercise: Yes 2-3 x weekly  ? Living Will: Yes  ? DNR: No  ? POA/HPOA: No  ?   ? ?Social Determinants of Health  ? ?Financial Resource Strain: Not on file  ?Food Insecurity: Not on file  ?Transportation Needs: Not on file  ?Physical Activity: Not on file  ?Stress: Not on file  ?Social Connections: Not on file  ?Intimate Partner Violence: Not on file  ? ? ?Family History: ?Family History  ?Problem Relation Age of Onset  ? Hypertension Mother   ? Arthritis Mother   ? Hypertension Father   ? Hypertension Brother   ?  Hypertension Brother   ? ? ? ?Review of Systems: ?All other systems reviewed and are otherwise negative except as noted above. ? ?Physical Exam: ?Vitals:  ? 08/02/21 1012  ?BP: 132/66  ?Pulse: 73  ?SpO2:

## 2021-08-02 ENCOUNTER — Ambulatory Visit (INDEPENDENT_AMBULATORY_CARE_PROVIDER_SITE_OTHER): Payer: Medicare Other | Admitting: Student

## 2021-08-02 ENCOUNTER — Encounter: Payer: Self-pay | Admitting: Student

## 2021-08-02 VITALS — BP 132/66 | HR 73 | Ht <= 58 in | Wt 117.8 lb

## 2021-08-02 DIAGNOSIS — I10 Essential (primary) hypertension: Secondary | ICD-10-CM

## 2021-08-02 DIAGNOSIS — I5022 Chronic systolic (congestive) heart failure: Secondary | ICD-10-CM

## 2021-08-02 DIAGNOSIS — I428 Other cardiomyopathies: Secondary | ICD-10-CM

## 2021-08-02 DIAGNOSIS — Z95810 Presence of automatic (implantable) cardiac defibrillator: Secondary | ICD-10-CM | POA: Diagnosis not present

## 2021-08-02 MED ORDER — FUROSEMIDE 20 MG PO TABS: 20.0000 mg | ORAL_TABLET | Freq: Every day | ORAL | 3 refills | Status: DC | PRN

## 2021-08-02 MED ORDER — LISINOPRIL 5 MG PO TABS: 5.0000 mg | ORAL_TABLET | Freq: Every day | ORAL | 5 refills | Status: DC

## 2021-08-02 NOTE — Patient Instructions (Signed)
Medication Instructions:  ?Your physician has recommended you make the following change in your medication:  ? ?START: Furosemide (lasix) '20mg'$  daily as needed ?CHANGE: Lisinopril to '5mg'$  daily at bedtime ? ?*If you need a refill on your cardiac medications before your next appointment, please call your pharmacy* ? ? ?Lab Work: ?None ?If you have labs (blood work) drawn today and your tests are completely normal, you will receive your results only by: ?MyChart Message (if you have MyChart) OR ?A paper copy in the mail ?If you have any lab test that is abnormal or we need to change your treatment, we will call you to review the results. ? ? ?Follow-Up: ?At [facility], you and your health needs are our priority.  As part of our continuing mission to provide you with exceptional heart care, we have created designated Provider Care Teams.  These Care Teams include your primary Cardiologist (physician) and Advanced Practice Providers (APPs -  Physician Assistants and Nurse Practitioners) who all work together to provide you with the care you need, when you need it. ? ?Your next appointment:   ?6 month(s) ? ?The format for your next appointment:   ?In Person ? ?Provider:   ?Cristopher Peru, MD  ? ?

## 2021-08-03 ENCOUNTER — Ambulatory Visit (INDEPENDENT_AMBULATORY_CARE_PROVIDER_SITE_OTHER): Payer: Medicare Other | Admitting: Neurology

## 2021-08-03 DIAGNOSIS — R4182 Altered mental status, unspecified: Secondary | ICD-10-CM

## 2021-08-14 ENCOUNTER — Ambulatory Visit (INDEPENDENT_AMBULATORY_CARE_PROVIDER_SITE_OTHER): Payer: Medicare Other | Admitting: Nurse Practitioner

## 2021-08-14 ENCOUNTER — Encounter: Payer: Self-pay | Admitting: Nurse Practitioner

## 2021-08-14 VITALS — BP 116/76 | HR 73 | Temp 97.3°F | Resp 16 | Ht <= 58 in

## 2021-08-14 DIAGNOSIS — I10 Essential (primary) hypertension: Secondary | ICD-10-CM | POA: Diagnosis not present

## 2021-08-14 DIAGNOSIS — F3181 Bipolar II disorder: Secondary | ICD-10-CM

## 2021-08-14 DIAGNOSIS — M5442 Lumbago with sciatica, left side: Secondary | ICD-10-CM | POA: Diagnosis not present

## 2021-08-14 DIAGNOSIS — G8929 Other chronic pain: Secondary | ICD-10-CM

## 2021-08-14 DIAGNOSIS — E871 Hypo-osmolality and hyponatremia: Secondary | ICD-10-CM

## 2021-08-14 DIAGNOSIS — R55 Syncope and collapse: Secondary | ICD-10-CM

## 2021-08-14 DIAGNOSIS — M5441 Lumbago with sciatica, right side: Secondary | ICD-10-CM

## 2021-08-14 MED ORDER — OXCARBAZEPINE 150 MG PO TABS: 150.0000 mg | ORAL_TABLET | Freq: Every day | ORAL | 1 refills | Status: DC

## 2021-08-14 NOTE — Progress Notes (Signed)
? ? ?Careteam: ?Patient Care Team: ?[name], NP as PCP - General (Geriatric Medicine) ?[name], MD as PCP - Electrophysiology (Cardiology) ?[name], MD as Consulting Physician (General Surgery) ?Paralee Cancel, MD as Consulting Physician (Orthopedic Surgery) ?Marica Otter, OD (Optometry) ?Marcial Pacas, MD as Consulting Physician (Neurology) ?[name], MD as Consulting Physician (Cardiology) ?Ricard Dillon, MD (Psychiatry) ? ?PLACE OF SERVICE:  ?[facility]  ?Advanced Directive information ?Does Patient Have a Medical Advance Directive?: Yes, Type of Advance Directive: Living will;Out of facility DNR (pink MOST or yellow form), Pre-existing out of facility DNR order (yellow form or pink MOST form): Yellow form placed in chart (order not valid for inpatient use), Does patient want to make changes to medical advance directive?: No - Patient declined ? ?Allergies  ?Allergen Reactions  ? Iodine Shortness Of Breath  ?  Iodine contrast, CHF , SOB  ? Shellfish Allergy Shortness Of Breath  ? Memantine   ?  Malaise, fogginess/ couldn't think  ? Aspirin Nausea And Vomiting  ? Codeine Nausea And Vomiting  ? ? ?Chief Complaint  ?Patient presents with  ? Medical Management of Chronic Issues  ?  3 month follow up.  ? ? ? ?HPI: Patient is a 88 y.o. female for follow up.  ?Last time seen in office had syncopal episode- has followed with cardiology and neurology since.  ?Saw psychiatrist. Stopped Zoloft and increased Cymbalta to 60 mg daily.  ?Denies dizziness.  ?No more syncopal episodes. ?Memory is worse.  ?No racing thoughts, mood swings.  ? ?Reports pain is worse with increase in in cymbalta.  ?She does her stretches and movements to help with her neck soreness.  ? ?She is currently on trileptal 300 mg- was taking 600 mg but could not tolerate.  ?Unsure why she is taking this.  ? ?Constipation- controlled on miralax  ? ?No longer having dizziness.  ? ?Htn- on lisinopril, coreg twice daily,  aldactone and lasix PRN ? ?Review of Systems:  ?Review of Systems  ?Constitutional:  Negative for chills, fever and weight loss.  ?HENT:  Negative for tinnitus.   ?Respiratory:  Negative for cough, sputum production and shortness of breath.   ?Cardiovascular:  Negative for chest pain, palpitations and leg swelling.  ?Gastrointestinal:  Negative for abdominal pain, constipation, diarrhea and heartburn.  ?Genitourinary:  Negative for dysuria, frequency and urgency.  ?Musculoskeletal:  Positive for back pain. Negative for falls, joint pain and myalgias.  ?Skin: Negative.   ?Neurological:  Positive for tingling. Negative for dizziness and headaches.  ?Psychiatric/Behavioral:  Negative for depression and memory loss. The patient does not have insomnia.   ? ?Past Medical History:  ?Diagnosis Date  ? Arthritis   ? Cancer [facility])   ? left breast cancer   ? Cataracts, bilateral   ? removed by surgery  ? CHF (congestive heart failure) ([place])   ? PACEMAKER & DEFIB  ? Complication of anesthesia   ? hypotensive after back surgery in 2006  ? Depression   ? Dyslipidemia   ? Fainted 04/21/06  ? AT [facility]  ? GERD (gastroesophageal reflux disease)   ? Headache(784.0)   ? Hearing loss   ? bilateral hearing aids  ? HLD (hyperlipidemia)   ? diet controlled   ? Hypertension   ? Hypothyroidism   ? ICD (implantable cardiac defibrillator) in place   ? pt has pacer/icd  ? ICD (implantable cardiac defibrillator), biventricular, in situ   ? LBBB (left bundle branch block)   ?  Memory loss   ? Nonischemic cardiomyopathy ([place])   ? Normal coronary arteries   ? s/p cardiac cath 2007  ? Pacemaker   ? ICD [employer]  ? Syncope   ? Systolic CHF ([place])   ? Vertigo   ? Wears glasses   ? ?Past Surgical History:  ?Procedure Laterality Date  ? BACK SURGERY    ? lumbar fusion   ? BREAST LUMPECTOMY Left 2008  ? BREAST SURGERY  2000  ? LUMP REMOVAL. STAGE 1 CANCER  ? CARDIAC CATHETERIZATION    ? CATARACT EXTRACTION    ? COLONOSCOPY    ? EYE SURGERY    ?  IMPLANTABLE CARDIOVERTER DEFIBRILLATOR GENERATOR CHANGE N/A 12/18/2012  ? Procedure: IMPLANTABLE CARDIOVERTER DEFIBRILLATOR GENERATOR CHANGE;  Surgeon: [name], MD;  Location: [facility] CATH LAB;  Service: Cardiovascular;  Laterality: N/A;  ? JOINT REPLACEMENT  06/14/01  ? right  ? LUMBAR FUSION  2006  ? MASS EXCISION  11/08/2011  ? Procedure: EXCISION MASS;  Surgeon: [name], MD;  Location: [facility];  Service: General;  Laterality: Left;  Excision Left Thigh Mass  ? MASTECTOMY W/ SENTINEL NODE BIOPSY Left 06/04/2019  ? Procedure: LEFT MASTECTOMY WITH SENTINEL LYMPH NODE BIOPSY;  Surgeon: [name], MD;  Location: [place];  Service: General;  Laterality: Left;  ? MASTECTOMY, PARTIAL  2008  ? GOT PACEMAKER AND DEFIB AT THAT TIME  ? PACEMAKER INSERTION  04/23/06  ? TOTAL KNEE ARTHROPLASTY  05/17/01  ? RIGHT KNEE  ? TOTAL KNEE ARTHROPLASTY Left 11/29/2014  ? Procedure: TOTAL LEFT KNEE ARTHROPLASTY;  Surgeon: Paralee Cancel, MD;  Location: [facility];  Service: Orthopedics;  Laterality: Left;  ? ?Social History: ?  reports that she has never smoked. She has never used smokeless tobacco. She reports that she does not drink alcohol and does not use drugs. ? ?Family History  ?Problem Relation Age of Onset  ? Hypertension Mother   ? Arthritis Mother   ? Hypertension Father   ? Hypertension Brother   ? Hypertension Brother   ? ? ?Medications: ?Patient's Medications  ?New Prescriptions  ? No medications on file  ?Previous Medications  ? ACETAMINOPHEN (TYLENOL) 500 MG TABLET    Take 500 mg by mouth in the morning and at bedtime. 500 mg in the morning and afternoon  ? ACETAMINOPHEN (TYLENOL) 650 MG CR TABLET    Take 650 mg by mouth at bedtime.  ? ALBUTEROL (VENTOLIN HFA) 108 (90 BASE) MCG/ACT INHALER    Inhale 2 puffs into the lungs every 6 (six) hours as needed for wheezing or shortness of breath.  ? ASCORBIC ACID (VITAMIN C) 500 MG TABLET    Take 500 mg by mouth daily.  ? BUPROPION (WELLBUTRIN XL) 150 MG 24 HR TABLET    Take 1 tablet  (150 mg total) by mouth daily.  ? CARVEDILOL (COREG) 3.125 MG TABLET    TAKE 1 TABLET BY MOUTH  TWICE DAILY  ? CHOLECALCIFEROL (VITAMIN D3) 50 MCG (2000 UT) TABS    Take 2,000 Units by mouth daily.   ? DICLOFENAC SODIUM (VOLTAREN) 1 % GEL    Apply 4 g topically 4 (four) times daily.  ? DULOXETINE (CYMBALTA) 60 MG CAPSULE    Take 60 mg by mouth daily.  ? FAMOTIDINE (PEPCID) 10 MG TABLET    Take 10 mg by mouth as needed for heartburn or indigestion.  ? FLUTICASONE (FLONASE) 50 MCG/ACT NASAL SPRAY    Place 2 sprays into both nostrils daily.  ?  FUROSEMIDE (LASIX) 20 MG TABLET    Take 1 tablet (20 mg total) by mouth daily as needed.  ? LEVOTHYROXINE (SYNTHROID, LEVOTHROID) 100 MCG TABLET    Take 1 tablet (100 mcg total) by mouth daily before breakfast.  ? LIDOCAINE 4 % PTCH    Apply 1 patch topically every 12 (twelve) hours. Apply to back  ? LISINOPRIL (ZESTRIL) 5 MG TABLET    Take 1 tablet (5 mg total) by mouth at bedtime.  ? MAGNESIUM 250 MG TABS    Take 250 mg by mouth daily.  ? MECLIZINE (ANTIVERT) 12.5 MG TABLET    Take 1 tablet (12.5 mg total) by mouth 3 (three) times daily as needed for dizziness.  ? NONFORMULARY OR COMPOUNDED ITEM    Apply 120 Tubes topically daily. Diclofenac/Cyclobenzaprine/lamotrigine/lidocaine/prilocaine (10480) 2%/2%/6%/5%/1.25% cream ?QTY: 120 GM ?SIG: (NEURO) APPLY 1-2 PUMPS (1-2 GMS) TO AFFECTED AREA (S) OR FOCAL POINTS 3 TO 4 TIMES DAILY. RUB IN FOR 2 MINUTES TO ACHIEVE MAX PENETRATION. [place] HANDS WELL.  ? OMEGA-3 FATTY ACIDS (FISH OIL PO)    Take 1 capsule by mouth daily.  ? OMEPRAZOLE (PRILOSEC) 40 MG CAPSULE    Take 1 capsule (40 mg total) by mouth daily.  ? POLYETHYLENE GLYCOL POWDER (GLYCOLAX/MIRALAX) 17 GM/SCOOP POWDER    Take 1 Container by mouth daily.  ? PREGABALIN (LYRICA) 25 MG CAPSULE    Take 25 mg by mouth 2 (two) times daily. 1 in the AM and 1 at night for back/nerve pain.  ? SENNA-DOCUSATE (SENOKOT S) 8.6-50 MG TABLET    Take 2 tablets by mouth daily.  ? SPIRONOLACTONE  (ALDACTONE) 25 MG TABLET    TAKE 1 TABLET BY MOUTH IN  THE MORNING  ? VIBEGRON (GEMTESA) 75 MG TABS    Take 75 mg by mouth daily.  ? VITAMIN E 400 UNITS TABS    Take 400 Units by mouth daily.   ?Modified Medic

## 2021-08-14 NOTE — Progress Notes (Signed)
Pls inform patient EEG is normal, no seizures thanks

## 2021-08-14 NOTE — Patient Instructions (Signed)
To do exercises for strengthen back and core ? ?Decrease trileptal to 150 mg tablet- new Rx sent to pharmacy  ?

## 2021-08-14 NOTE — Procedures (Signed)
ELECTROENCEPHALOGRAM REPORT ? ?Date of Study: 08/03/2021 ? ?Patient's Name: [name] ?MRN: [id number] ?Date of Birth: 09/06/1933 ? ?Referring Provider: Sharene Butters, PA-C ? ?Clinical History: This is an 88 year old woman with episodes of decreased responsiveness. EEG for classification. ? ?Medications: ?TYLENOL 500 MG tablet ?VENTOLIN HFA 108 (90 Base) MCG/ACT inhaler ?VITAMIN C 500 MG tablet ?WELLBUTRIN XL 150 MG 24 hr tablet ?COREG 3.125 MG tablet ?VITAMIN D3 50 MCG (2000 UT) TABS ?VOLTAREN 1 % GEL ?CYMBALTA 30 MG capsule ?PEPCID 10 MG tablet ?FLONASE 50 MCG/ACT nasal spray ?SYNTHROID, LEVOTHROID 100 MCG tablet ?Lidocaine 4 % PTCH ?ZESTRIL 5 MG tablet ?Magnesium 250 MG TABS ?ANTIVERT 12.5 MG tablet ?FISH OIL PO ?PRILOSEC 40 MG capsule ?TRILEPTAL 300 MG tablet ?GLYCOLAX/MIRALAX 17 GM/SCOOP powder ?LYRICA 25 MG capsule ?SENOKOT S 8.6-50 MG tablet ?ZOLOFT 100 MG tablet ?ALDACTONE 25 MG tablet ?GEMT&ESA 75 MG TABS ?Vitamin E 400 units TABS ? ?Technical Summary: ?A multichannel digital EEG recording measured by the international 10-20 system with electrodes applied with paste and impedances below 5000 ohms performed in our laboratory with EKG monitoring in an awake and asleep patient.  Hyperventilation was not performed. Photic stimulation was performed.  The digital EEG was referentially recorded, reformatted, and digitally filtered in a variety of bipolar and referential montages for optimal display.   ? ?Description: ?The patient is awake and asleep during the recording.  During maximal wakefulness, there is no clear posterior dominant rhythm seen. The record is symmetric.  During drowsiness and sleep, there is an increase in theta slowing of the background.  Vertex waves and symmetric sleep spindles were seen.  Photic stimulation did not elicit any abnormalities.  There were no epileptiform discharges or electrographic seizures seen.   ? ?EKG lead was unremarkable. ? ?Impression: ?This awake and asleep EEG  is normal.   ? ?Clinical Correlation: ?A normal EEG does not exclude a clinical diagnosis of epilepsy.  If further clinical questions remain, prolonged EEG may be helpful.  Clinical correlation is advised. ? ? ?Ellouise Newer, M.D. ? ?

## 2021-08-29 ENCOUNTER — Telehealth: Payer: Self-pay | Admitting: *Deleted

## 2021-08-29 NOTE — Telephone Encounter (Signed)
[name] with [name] called requesting verbal orders for PT 1x1week, 2x3weeks, 1x5weeks.   Verbal orders given.

## 2021-09-11 ENCOUNTER — Encounter: Payer: Self-pay | Admitting: Nurse Practitioner

## 2021-09-11 NOTE — Telephone Encounter (Signed)
Placed in [place] folder for review.

## 2021-09-11 NOTE — Telephone Encounter (Signed)
Form Printed and placed in [place] under "19" for appointment on 6/19 with Jessica. Note added to upcoming appointment.

## 2021-09-11 NOTE — Telephone Encounter (Signed)
I can probably complete this prior to visit if placed in my folder

## 2021-09-25 ENCOUNTER — Ambulatory Visit (INDEPENDENT_AMBULATORY_CARE_PROVIDER_SITE_OTHER): Payer: Medicare Other | Admitting: Nurse Practitioner

## 2021-09-25 ENCOUNTER — Encounter: Payer: Self-pay | Admitting: Nurse Practitioner

## 2021-09-25 VITALS — BP 122/72 | HR 71 | Temp 96.9°F | Ht <= 58 in | Wt 116.0 lb

## 2021-09-25 DIAGNOSIS — I10 Essential (primary) hypertension: Secondary | ICD-10-CM

## 2021-09-25 DIAGNOSIS — F3181 Bipolar II disorder: Secondary | ICD-10-CM | POA: Diagnosis not present

## 2021-09-25 DIAGNOSIS — I872 Venous insufficiency (chronic) (peripheral): Secondary | ICD-10-CM

## 2021-09-25 DIAGNOSIS — Z7189 Other specified counseling: Secondary | ICD-10-CM

## 2021-09-25 DIAGNOSIS — M5441 Lumbago with sciatica, right side: Secondary | ICD-10-CM

## 2021-09-25 DIAGNOSIS — G8929 Other chronic pain: Secondary | ICD-10-CM

## 2021-09-25 DIAGNOSIS — M5442 Lumbago with sciatica, left side: Secondary | ICD-10-CM

## 2021-09-25 DIAGNOSIS — E871 Hypo-osmolality and hyponatremia: Secondary | ICD-10-CM

## 2021-09-25 DIAGNOSIS — R053 Chronic cough: Secondary | ICD-10-CM

## 2021-09-25 DIAGNOSIS — R55 Syncope and collapse: Secondary | ICD-10-CM

## 2021-09-25 MED ORDER — TELMISARTAN 20 MG PO TABS: 20.0000 mg | ORAL_TABLET | Freq: Every day | ORAL | 0 refills | Status: DC

## 2021-09-25 NOTE — Patient Instructions (Addendum)
Stop lisinopril 5 mg daily Start telmisartan 20  mg daily   Low potassium diet- no salt substitutes  To decrease trilipital to every other day for 2 weeks then every 3 days for 1 week then stop   Continue to monitor blood pressure   -encouraged to elevate legs above level of heart as tolerates (at least 1 hour in the afternoon) , low sodium diet, compression hose as tolerates (on in am, off in pm)

## 2021-09-25 NOTE — Progress Notes (Signed)
Careteam: Patient Care Team: [name], NP as PCP - General (Geriatric Medicine) [name], MD as PCP - Electrophysiology (Cardiology) [name], MD as Consulting Physician (General Surgery) Paralee Cancel, MD as Consulting Physician (Orthopedic Surgery) Marica Otter, [place] (Optometry) Marcial Pacas, MD as Consulting Physician (Neurology) [name], MD as Consulting Physician (Cardiology) Ricard Dillon, MD (Psychiatry)  PLACE OF SERVICE:  [place]  Advanced Directive information    Allergies  Allergen Reactions   Iodine Shortness Of Breath    Iodine contrast, CHF , SOB   Shellfish Allergy Shortness Of Breath   Memantine     Malaise, fogginess/ couldn't think   Aspirin Nausea And Vomiting   Codeine Nausea And Vomiting    Chief Complaint  Patient presents with   Medical Management of Chronic Issues    6 week follow up on (Trileptal) mood medication.    Form Completion    [employer] [facility] [employer] form placement.       HPI: Patient is a 88 y.o. female for follow up  Needs signed DNR form for [employer] [facility].   Has a cough- been getting worse over the last few months.   She has decreased her trileptal to 150 mg- no effects noted with the changes. No worsening back pain.   Htn- on coreg 3.125 mg bid, lisinopril 5 mg, Spironolactone   Ongoing back pain- pain doctor increased her cymbalta but she could not tolerate due to side effects.  She continues to have a hard time waking up in the morning.   Review of Systems:  Review of Systems  Constitutional:  Negative for chills, fever and weight loss.  HENT:  Negative for tinnitus.   Respiratory:  Negative for cough, sputum production and shortness of breath.   Cardiovascular:  Negative for chest pain, palpitations and leg swelling.  Gastrointestinal:  Negative for abdominal pain, constipation, diarrhea and heartburn.  Genitourinary:  Negative for dysuria, frequency and  urgency.  Musculoskeletal:  Negative for back pain, falls, joint pain and myalgias.  Skin: Negative.   Neurological:  Negative for dizziness and headaches.  Psychiatric/Behavioral:  Negative for depression and memory loss. The patient does not have insomnia.     Past Medical History:  Diagnosis Date   Arthritis    Cancer ([place])    left breast cancer    Cataracts, bilateral    removed by surgery   CHF (congestive heart failure) ([place])    PACEMAKER & DEFIB   Complication of anesthesia    hypotensive after back surgery in 2006   Depression    Dyslipidemia    Fainted 04/21/06   AT [facility]   GERD (gastroesophageal reflux disease)    Headache(784.0)    Hearing loss    bilateral hearing aids   HLD (hyperlipidemia)    diet controlled    Hypertension    Hypothyroidism    ICD (implantable cardiac defibrillator) in place    pt has pacer/icd   ICD (implantable cardiac defibrillator), biventricular, in situ    LBBB (left bundle branch block)    Memory loss    Nonischemic cardiomyopathy ([place])    Normal coronary arteries    s/p cardiac cath 2007   Pacemaker    ICD [employer]   Syncope    Systolic CHF [facility])    Vertigo    Wears glasses    Past Surgical History:  Procedure Laterality Date   BACK SURGERY     lumbar fusion  BREAST LUMPECTOMY Left 2008   BREAST SURGERY  2000   LUMP REMOVAL. STAGE 1 CANCER   CARDIAC CATHETERIZATION     CATARACT EXTRACTION     COLONOSCOPY     EYE SURGERY     IMPLANTABLE CARDIOVERTER DEFIBRILLATOR GENERATOR CHANGE N/A 12/18/2012   Procedure: IMPLANTABLE CARDIOVERTER DEFIBRILLATOR GENERATOR CHANGE;  Surgeon: [name], MD;  Location: [facility] CATH LAB;  Service: Cardiovascular;  Laterality: N/A;   JOINT REPLACEMENT  06/14/01   right   LUMBAR FUSION  2006   MASS EXCISION  11/08/2011   Procedure: EXCISION MASS;  Surgeon: [name], MD;  Location: [facility];  Service: General;  Laterality: Left;  Excision Left Thigh Mass   MASTECTOMY W/ SENTINEL  NODE BIOPSY Left 06/04/2019   Procedure: LEFT MASTECTOMY WITH SENTINEL LYMPH NODE BIOPSY;  Surgeon: [name], MD;  Location: [place];  Service: General;  Laterality: Left;   MASTECTOMY, PARTIAL  2008   GOT PACEMAKER AND DEFIB AT THAT TIME   PACEMAKER INSERTION  04/23/06   TOTAL KNEE ARTHROPLASTY  05/17/01   RIGHT KNEE   TOTAL KNEE ARTHROPLASTY Left 11/29/2014   Procedure: TOTAL LEFT KNEE ARTHROPLASTY;  Surgeon: Paralee Cancel, MD;  Location[facility];  Service: Orthopedics;  Laterality: Left;   Social History:   reports that she has never smoked. She has never used smokeless tobacco. She reports that she does not drink alcohol and does not use drugs.  Family History  Problem Relation Age of Onset   Hypertension Mother    Arthritis Mother    Hypertension Father    Hypertension Brother    Hypertension Brother     Medications: Patient's Medications  New Prescriptions   No medications on file  Previous Medications   ACETAMINOPHEN (TYLENOL) 500 MG TABLET    Take 500 mg by mouth in the morning and at bedtime. 500 mg in the morning and afternoon   ACETAMINOPHEN (TYLENOL) 650 MG CR TABLET    Take 650 mg by mouth at bedtime.   ALBUTEROL (VENTOLIN HFA) 108 (90 BASE) MCG/ACT INHALER    Inhale 2 puffs into the lungs every 6 (six) hours as needed for wheezing or shortness of breath.   ASCORBIC ACID (VITAMIN C) 500 MG TABLET    Take 500 mg by mouth daily.   BUPROPION (WELLBUTRIN XL) 150 MG 24 HR TABLET    Take 1 tablet (150 mg total) by mouth daily.   CARVEDILOL (COREG) 3.125 MG TABLET    TAKE 1 TABLET BY MOUTH  TWICE DAILY   CHOLECALCIFEROL (VITAMIN D3) 50 MCG (2000 UT) TABS    Take 2,000 Units by mouth daily.    DICLOFENAC SODIUM (VOLTAREN) 1 % GEL    Apply 4 g topically 4 (four) times daily.   DULOXETINE (CYMBALTA) 60 MG CAPSULE    Take 60 mg by mouth daily.   FAMOTIDINE (PEPCID) 10 MG TABLET    Take 10 mg by mouth as needed for heartburn or indigestion.   FLUTICASONE (FLONASE) 50 MCG/ACT NASAL  SPRAY    Place 2 sprays into both nostrils daily.   FUROSEMIDE (LASIX) 20 MG TABLET    Take 1 tablet (20 mg total) by mouth daily as needed.   LEVOTHYROXINE (SYNTHROID, LEVOTHROID) 100 MCG TABLET    Take 1 tablet (100 mcg total) by mouth daily before breakfast.   LIDOCAINE 4 % PTCH    Apply 1 patch topically every 12 (twelve) hours. Apply to back   LISINOPRIL (ZESTRIL) 5 MG TABLET  Take 1 tablet (5 mg total) by mouth at bedtime.   MAGNESIUM 250 MG TABS    Take 250 mg by mouth daily.   MECLIZINE (ANTIVERT) 12.5 MG TABLET    Take 1 tablet (12.5 mg total) by mouth 3 (three) times daily as needed for dizziness.   NONFORMULARY OR COMPOUNDED ITEM    Apply 120 Tubes topically daily. Diclofenac/Cyclobenzaprine/lamotrigine/lidocaine/prilocaine (10480) 2%/2%/6%/5%/1.25% cream QTY: 120 GM SIG: (NEURO) APPLY 1-2 PUMPS (1-2 GMS) TO AFFECTED AREA (S) OR FOCAL POINTS 3 TO 4 TIMES DAILY. RUB IN FOR 2 MINUTES TO ACHIEVE MAX PENETRATION. [place] HANDS WELL.   OMEGA-3 FATTY ACIDS (FISH OIL PO)    Take 1 capsule by mouth daily.   OMEPRAZOLE (PRILOSEC) 40 MG CAPSULE    Take 1 capsule (40 mg total) by mouth daily.   OXCARBAZEPINE (TRILEPTAL) 150 MG TABLET    Take 1 tablet (150 mg total) by mouth daily.   POLYETHYLENE GLYCOL POWDER (GLYCOLAX/MIRALAX) 17 GM/SCOOP POWDER    Take 1 Container by mouth daily.   PREGABALIN (LYRICA) 25 MG CAPSULE    Take 25 mg by mouth 2 (two) times daily. 1 in the AM and 1 at night for back/nerve pain.   SENNA-DOCUSATE (SENOKOT S) 8.6-50 MG TABLET    Take 2 tablets by mouth daily.   SPIRONOLACTONE (ALDACTONE) 25 MG TABLET    TAKE 1 TABLET BY MOUTH IN  THE MORNING   VIBEGRON (GEMTESA) 75 MG TABS    Take 75 mg by mouth daily.   VITAMIN E 400 UNITS TABS    Take 400 Units by mouth daily.   Modified Medications   No medications on file  Discontinued Medications   No medications on file    Physical Exam:  Vitals:   09/25/21 1408  BP: 122/72  Pulse: 71  Temp: (!) 96.9 F (36.1 C)  SpO2:  97%  Weight: 116 lb (52.6 kg)  Height: '4\' 10"'$  (1.473 m)   Body mass index is 24.24 kg/m. Wt Readings from Last 3 Encounters:  09/25/21 116 lb (52.6 kg)  08/02/21 117 lb 12.8 oz (53.4 kg)  07/25/21 118 lb (53.5 kg)    Physical Exam Constitutional:      General: She is not in acute distress.    Appearance: She is well-developed. She is not diaphoretic.  HENT:     Head: Normocephalic and atraumatic.     Mouth/Throat:     Pharynx: No oropharyngeal exudate.  Eyes:     Conjunctiva/sclera: Conjunctivae normal.     Pupils: Pupils are equal, round, and reactive to light.  Cardiovascular:     Rate and Rhythm: Normal rate and regular rhythm.     Heart sounds: Normal heart sounds.  Pulmonary:     Effort: Pulmonary effort is normal.     Breath sounds: Normal breath sounds.  Abdominal:     General: Bowel sounds are normal.     Palpations: Abdomen is soft.  Musculoskeletal:     Cervical back: Normal range of motion and neck supple.     Right lower leg: No edema.     Left lower leg: No edema.  Skin:    General: Skin is warm and dry.  Neurological:     Mental Status: She is alert.  Psychiatric:        Mood and Affect: Mood normal.     Labs reviewed: Basic Metabolic Panel: Recent Labs    11/17/20 0808 12/21/20 1009 06/02/21 0839 06/19/21 1015 06/28/21 1003 07/02/21 1034 07/07/21 8527  NA 139   < > 138   < > 132* 133* 132*  K 4.4   < > 4.9   < > 4.3 4.2 5.0  CL 104   < > 100   < > 98 100 98  CO2 28   < > 32   < > '27 27 25  '$ GLUCOSE 95   < > 103   < > 121 120* 84  BUN 24   < > 28*   < > '17 22 22  '$ CREATININE 0.70   < > 1.04*   < > 0.79 0.92 0.82  CALCIUM 9.8   < > 11.1*   < > 9.8 10.0 9.7  TSH 0.67  --  0.81  --   --   --   --    < > = values in this interval not displayed.   Liver Function Tests: Recent Labs    12/21/20 1009 06/02/21 0839 06/20/21 0945 07/07/21 0951  AST '24 26 22 29  '$ ALT '21 20 18 18  '$ ALKPHOS 53  --  60  --   BILITOT 0.3 0.4 0.4 0.4  PROT 7.3  7.2 7.0 7.2  ALBUMIN 3.9  --  4.0  --    No results for input(s): "LIPASE", "AMYLASE" in the last 8760 hours. No results for input(s): "AMMONIA" in the last 8760 hours. CBC: Recent Labs    06/20/21 0945 07/02/21 1034 07/07/21 0951  WBC 6.6 6.7 5.3  NEUTROABS 4.8 4.5 3,344  HGB 13.0 12.1 12.5  HCT 38.2 35.9* 38.1  MCV 86.4 88.6 88.8  PLT 117* 145* 143   Lipid Panel: Recent Labs    11/17/20 0808  CHOL 171  HDL 63  LDLCALC 90  TRIG 86  CHOLHDL 2.7   TSH: Recent Labs    11/17/20 0808 06/02/21 0839  TSH 0.67 0.81   A1C: No results found for: "HGBA1C"   Assessment/Plan 1. Essential hypertension -she has had a chronic cough since about the time she started lisinopril. Will stop at this time and start telmisartan and follow potassium closely  - telmisartan (MICARDIS) 20 MG tablet; Take 1 tablet (20 mg total) by mouth daily.  Dispense: 90 tablet; Refill: 0  2. Low sodium levels -will follow up today - BASIC METABOLIC PANEL WITH GFR  3. Bipolar II disorder ([place]) -doing well with titration of trileptal, will titrate off trileptal at   4. Chronic bilateral low back pain with bilateral sciatica -ongoing, continues to follow up with pain management. On cymbalta which has be beneficial.   5. Syncope, unspecified syncope type -no further episodes.   6. Venous insufficiency of both lower extremities --encouraged to elevate legs above level of heart as tolerates, low sodium diet, compression hose as tolerates (on in am, off in pm)  7. DNR (do not resuscitate) discussion - DNR (Do Not Resuscitate)  9. Chronic cough -possibly due to lisinopril, will have her stop at this time and start telmisartan.    Return in about 4 weeks (around 10/23/2021) for blood pressure, cough, and mood. Carlos American. [place], [facility] [phone]

## 2021-09-26 LAB — BASIC METABOLIC PANEL WITH GFR
BUN: 24 mg/dL (ref 7–25)
CO2: 30 mmol/L (ref 20–32)
Calcium: 10.7 mg/dL — ABNORMAL HIGH (ref 8.6–10.4)
Chloride: 99 mmol/L (ref 98–110)
Creat: 0.72 mg/dL (ref 0.60–0.95)
Glucose, Bld: 104 mg/dL (ref 65–139)
Potassium: 4.6 mmol/L (ref 3.5–5.3)
Sodium: 135 mmol/L (ref 135–146)
eGFR: 82 mL/min/{1.73_m2} (ref >=60)

## 2021-09-27 ENCOUNTER — Other Ambulatory Visit: Payer: Self-pay

## 2021-09-27 DIAGNOSIS — E871 Hypo-osmolality and hyponatremia: Secondary | ICD-10-CM

## 2021-10-02 ENCOUNTER — Encounter
Payer: Medicare Other | Attending: Physical Medicine and Rehabilitation | Admitting: Physical Medicine and Rehabilitation

## 2021-10-02 ENCOUNTER — Encounter: Payer: Self-pay | Admitting: Physical Medicine and Rehabilitation

## 2021-10-02 VITALS — BP 116/70 | HR 74 | Ht <= 58 in | Wt 116.0 lb

## 2021-10-02 DIAGNOSIS — F3181 Bipolar II disorder: Secondary | ICD-10-CM | POA: Diagnosis present

## 2021-10-02 DIAGNOSIS — G894 Chronic pain syndrome: Secondary | ICD-10-CM

## 2021-10-02 DIAGNOSIS — M5430 Sciatica, unspecified side: Secondary | ICD-10-CM | POA: Diagnosis present

## 2021-10-02 DIAGNOSIS — M539 Dorsopathy, unspecified: Secondary | ICD-10-CM | POA: Diagnosis present

## 2021-10-02 NOTE — Progress Notes (Signed)
Subjective:    Patient ID: [name], female    DOB: 12-15-1933, 88 y.o.   MRN: [id number]  HPI  Pt is an 88 yr old female with hx of breast CA and RUE lymphedema, as well as uterine prolapse, NICCM- as well as Chronic low back pain with B/L sciatica- also has memory issues on Aricept; hx of lumbar fusion at L4/5. Has a hx of Bipolar Type II depression. Is on Zoloft 200 mg daily, Trilpetal and Wellbutrin for mood.  Here for f/u on Lumbar radiculopathy  Last labs show Na of 135.   Pain ~ 6/10- things are about the same from when she started seeing me.   Tried the Lyrica 25/50 mg- so back to 25 mg BID since 50 mg was too strong- was light headed and dizzy on 50 mg Lyrica.    Has had epidural steroid injections- ~ 9x- not helpful.  The are trying to reduce the Trileptal to get her off it due to low Sodium.    Still has 3 weeks of therapy to go- hasn't made pain better.        Pain Inventory Average Pain 6 Pain Right Now 6 My pain is dull and aching  In the last 24 hours, has pain interfered with the following? General activity 6 Relation with others 0 Enjoyment of life 5 What TIME of day is your pain at its worst? morning  Sleep (in general) Fair  Pain is worse with: walking and standing Pain improves with: rest and medication Relief from Meds: 3  Family History  Problem Relation Age of Onset   Hypertension Mother    Arthritis Mother    Hypertension Father    Hypertension Brother    Hypertension Brother    Social History   Socioeconomic History   Marital status: Married    Spouse name: Not on file   Number of children: 1   Years of education: Masters   Highest education level: Not on file  Occupational History   Occupation: Retired  Tobacco Use   Smoking status: Never   Smokeless tobacco: Never  Building services engineer Use: Never used  Substance and Sexual Activity   Alcohol use: No   Drug use: No   Sexual activity: Not Currently    Birth  control/protection: Post-menopausal  Other Topics Concern   Not on file  Social History Narrative   Lives at home with husband.   Right-handed.      As of 07/28/2014   Diet: No special diet   Caffeine: yes, Chocolate, tea and sodas    Married: YES, 1970   House: Yes, 2 stories, 2-3 persons live in home   Pets: No   Current/Past profession: Nurse, mental health, Publishing rights manager    Exercise: Yes 2-3 x weekly   Living Will: Yes   DNR: No   POA/HPOA: No      Social Determinants of Health   Financial Resource Strain: Low Risk  (03/27/2017)   Overall Financial Resource Strain (CARDIA)    Difficulty of Paying Living Expenses: Not hard at all  Food Insecurity: No Food Insecurity (03/27/2017)   Hunger Vital Sign    Worried About Running Out of Food in the Last Year: Never true    Ran Out of Food in the Last Year: Never true  Transportation Needs: No Transportation Needs (03/27/2017)   PRAPARE - Administrator, Civil Service (Medical): No    Lack of Transportation (Non-Medical): No  Physical Activity: Insufficiently Active (03/27/2017)   Exercise Vital Sign    Days of Exercise per Week: 4 days    Minutes of Exercise per Session: 30 min  Stress: Stress Concern Present (03/27/2017)   [employer] of Occupational Health - Occupational Stress Questionnaire    Feeling of Stress : To some extent  Social Connections: Socially Integrated (03/27/2017)   Social Connection and Isolation Panel [NHANES]    Frequency of Communication with Friends and Family: More than three times a week    Frequency of Social Gatherings with Friends and Family: Once a week    Attends Religious Services: More than 4 times per year    Active Member of [employer] or Organizations: Yes    Attends Banker Meetings: Never    Marital Status: Married   Past Surgical History:  Procedure Laterality Date   BACK SURGERY     lumbar fusion    BREAST LUMPECTOMY Left 2008   BREAST SURGERY  2000    LUMP REMOVAL. STAGE 1 CANCER   CARDIAC CATHETERIZATION     CATARACT EXTRACTION     COLONOSCOPY     EYE SURGERY     IMPLANTABLE CARDIOVERTER DEFIBRILLATOR GENERATOR CHANGE N/A 12/18/2012   Procedure: IMPLANTABLE CARDIOVERTER DEFIBRILLATOR GENERATOR CHANGE;  Surgeon: Marinus Maw, MD;  Location: [facility] CATH LAB;  Service: Cardiovascular;  Laterality: N/A;   JOINT REPLACEMENT  06/14/01   right   LUMBAR FUSION  2006   MASS EXCISION  11/08/2011   Procedure: EXCISION MASS;  Surgeon: Almond Lint, MD;  Location: [facility];  Service: General;  Laterality: Left;  Excision Left Thigh Mass   MASTECTOMY W/ SENTINEL NODE BIOPSY Left 06/04/2019   Procedure: LEFT MASTECTOMY WITH SENTINEL LYMPH NODE BIOPSY;  Surgeon: Almond Lint, MD;  Location: [facility];  Service: General;  Laterality: Left;   MASTECTOMY, PARTIAL  2008   GOT PACEMAKER AND DEFIB AT THAT TIME   PACEMAKER INSERTION  04/23/06   TOTAL KNEE ARTHROPLASTY  05/17/01   RIGHT KNEE   TOTAL KNEE ARTHROPLASTY Left 11/29/2014   Procedure: TOTAL LEFT KNEE ARTHROPLASTY;  Surgeon: Durene Romans, MD;  [facility];  Service: Orthopedics;  Laterality: Left;   Past Surgical History:  Procedure Laterality Date   BACK SURGERY     lumbar fusion    BREAST LUMPECTOMY Left 2008   BREAST SURGERY  2000   LUMP REMOVAL. STAGE 1 CANCER   CARDIAC CATHETERIZATION     CATARACT EXTRACTION     COLONOSCOPY     EYE SURGERY     IMPLANTABLE CARDIOVERTER DEFIBRILLATOR GENERATOR CHANGE N/A 12/18/2012   Procedure: IMPLANTABLE CARDIOVERTER DEFIBRILLATOR GENERATOR CHANGE;  Surgeon: Marinus Maw, MD;  Location: [facility] CATH LAB;  Service: Cardiovascular;  Laterality: N/A;   JOINT REPLACEMENT  06/14/01   right   LUMBAR FUSION  2006   MASS EXCISION  11/08/2011   Procedure: EXCISION MASS;  Surgeon: Almond Lint, MD;  [facility];  Service: General;  Laterality: Left;  Excision Left Thigh Mass   MASTECTOMY W/ SENTINEL NODE BIOPSY Left 06/04/2019   Procedure: LEFT MASTECTOMY WITH SENTINEL  LYMPH NODE BIOPSY;  Surgeon: Almond Lint, MD;  [facility];  Service: General;  Laterality: Left;   MASTECTOMY, PARTIAL  2008   GOT PACEMAKER AND DEFIB AT THAT TIME   PACEMAKER INSERTION  04/23/06   TOTAL KNEE ARTHROPLASTY  05/17/01   RIGHT KNEE   TOTAL KNEE ARTHROPLASTY Left 11/29/2014   Procedure: TOTAL LEFT KNEE ARTHROPLASTY;  Surgeon: Durene Romans, MD;  [facility];  Service: Orthopedics;  Laterality: Left;   Past Medical History:  Diagnosis Date   Arthritis    Cancer (HCC)    left breast cancer    Cataracts, bilateral    removed by surgery   CHF (congestive heart failure) (HCC)    PACEMAKER & DEFIB   Complication of anesthesia    hypotensive after back surgery in 2006   Depression    Dyslipidemia    Fainted 04/21/06   AT [facility]   GERD (gastroesophageal reflux disease)    Headache(784.0)    Hearing loss    bilateral hearing aids   HLD (hyperlipidemia)    diet controlled    Hypertension    Hypothyroidism    ICD (implantable cardiac defibrillator) in place    pt has pacer/icd   ICD (implantable cardiac defibrillator), biventricular, in situ    LBBB (left bundle branch block)    Memory loss    Nonischemic cardiomyopathy (HCC)    Normal coronary arteries    s/p cardiac cath 2007   Pacemaker    ICD [facility] Scientific   Syncope    Systolic CHF [facility])    Vertigo    Wears glasses    BP 116/70   Pulse 74   Ht 4\' 10"  (1.473 m)   Wt 116 lb (52.6 kg)   LMP  (LMP Unknown)   SpO2 96%   BMI 24.24 kg/m   Opioid Risk Score:   Fall Risk Score:  `1  Depression screen [facility] 2/9     10/02/2021    3:18 PM 06/19/2021    1:44 PM 03/20/2021    1:11 PM 02/04/2020   10:10 AM 11/06/2019    2:41 PM 04/15/2019   10:15 AM 11/24/2018    2:04 PM  Depression screen PHQ 2/9  Decreased Interest 0 0 2 0 3 0 0  Down, Depressed, Hopeless 0 0 2 0 1 0 0  PHQ - 2 Score 0 0 4 0 4 0 0  Altered sleeping   2  3    Tired, decreased energy   3  3    Change in appetite   1  0    Feeling  bad or failure about yourself    1  0    Trouble concentrating   1  1    Moving slowly or fidgety/restless   1  0    Suicidal thoughts   0  0    PHQ-9 Score   13  11    Difficult doing work/chores   Somewhat difficult         Review of Systems  Constitutional: Negative.   HENT: Negative.    Eyes: Negative.   Respiratory: Negative.    Cardiovascular: Negative.   Gastrointestinal: Negative.   Endocrine: Negative.   Genitourinary: Negative.   Musculoskeletal:  Positive for back pain and gait problem.  Skin: Negative.   Allergic/Immunologic: Negative.   Hematological: Negative.   Psychiatric/Behavioral: Negative.        Objective:   Physical Exam Awake, alert, appropriate, frail appearing- using single point cane; NAD Accompanied by daughter TTP midline lumbar spine- slightly on paraspinals, but mainly on middle- SLR (+) B/L        Assessment & Plan:   Pt is an 88 yr old female with hx of breast CA and RUE lymphedema, as well as uterine prolapse, NICCM- as well as Chronic low back pain with B/L sciatica- also has  memory issues on Aricept; hx of lumbar fusion at L4/5. Has a hx of Bipolar Type II depression. Is on Zoloft 200 mg daily, Trilpetal and Wellbutrin for mood.  Here for f/u on Lumbar radiculopathy  Went over options- compounding meds- low dose naltrexone vs spinal cord stimulator vs opiates.   2. Pt decided to try low dose naltrexone - will call in a 1 week's supply of 2 mg nightly x 7 days- if it's helpful, can then fill the 4 mg nightly x 1 month with 3 refills. Will call into [facility]- called in. With 3 refills- call me if 2 mg works and I can change it from a 4 mg nightly to 2 mg nightly-   3. Side effects- only ones I've seen are anxiety, but a few patients if taken during day say it can make them sleepy- so take at night.   4. Wait on surgical intervention and pt doesn't want opiates.   5. Con't Cymbalta 60 mg daily and lyrica 25 mg BID  6. F/U in 3  months- call me to let me know how things going.     7. TENS unit- try the one at pharmacy- can use with pacemaker, BUT can only use BELOW waist. She doesn't want to try.   I spent a total of  31   minutes on total care today- >50% coordination of care and education- due to discussing TENS, calling in low dose naltrexone and discussing pain options and Dr Roderic Ovens- wait on Dr Roderic Ovens referral for now.

## 2021-10-04 ENCOUNTER — Other Ambulatory Visit: Payer: Medicare Other

## 2021-10-04 DIAGNOSIS — E871 Hypo-osmolality and hyponatremia: Secondary | ICD-10-CM

## 2021-10-05 LAB — BASIC METABOLIC PANEL WITH GFR
BUN: 16 mg/dL (ref 7–25)
CO2: 26 mmol/L (ref 20–32)
Calcium: 10.3 mg/dL (ref 8.6–10.4)
Chloride: 97 mmol/L — ABNORMAL LOW (ref 98–110)
Creat: 0.82 mg/dL (ref 0.60–0.95)
Glucose, Bld: 101 mg/dL (ref 65–139)
Potassium: 4.2 mmol/L (ref 3.5–5.3)
Sodium: 133 mmol/L — ABNORMAL LOW (ref 135–146)
eGFR: 70 mL/min/{1.73_m2} (ref >=60)

## 2021-10-16 ENCOUNTER — Encounter: Payer: Self-pay | Admitting: Urology

## 2021-10-16 ENCOUNTER — Ambulatory Visit (INDEPENDENT_AMBULATORY_CARE_PROVIDER_SITE_OTHER): Payer: Medicare Other | Admitting: Urology

## 2021-10-16 VITALS — BP 103/66 | HR 71 | Ht 59.0 in

## 2021-10-16 DIAGNOSIS — N3946 Mixed incontinence: Secondary | ICD-10-CM

## 2021-10-16 DIAGNOSIS — N3281 Overactive bladder: Secondary | ICD-10-CM | POA: Diagnosis not present

## 2021-10-16 MED ORDER — MIRABEGRON ER 50 MG PO TB24: 50.0000 mg | ORAL_TABLET | Freq: Every day | ORAL | 0 refills | Status: DC

## 2021-10-16 NOTE — Progress Notes (Signed)
10/16/2021 10:39 AM   [name] Jun 29, 1933 [id number]  Referring provider: [name], NP [place],  [place] [address]  Chief Complaint  Patient presents with   Follow-up   Over Active Bladder    [facility]mh follow-up    HPI: [name]/SN: Overactive bladder on Myrbetriq.  Follow-up for renal cyst.  Was given Gemtesa last visit.  It was suggested to be fitted with a larger pessary.  Has medical comorbidities including dementia and previous stroke   The patient is going to see her gynecologist and as previously noted seek perhaps a larger one.  She has not had a hysterectomy  Currently she sometimes has urge incontinence if her bladder is full.  No leaking with coughing sneezing.  No bedwetting.  Wears 1 pad a day.  Patient has mild urge incontinence.  She thinks GLogan Boresworks a bit better than [employer]  Samples and prescription given.  Reassess 4 months.  I felt that a pessary is the best way to manage prolapse.  I hysterectomy with prolapse repair would obviously be less ideal in an elderly patient with comorbidities.  Her and her daughter agreed.  Today GLogan Boresis not working that well.  Still wears pads and changes tissue within a pad system.  Clinically not infected but I sent a urine for culture.  Frequency stable      PMH: Past Medical History:  Diagnosis Date   Arthritis    [place]    left breast cancer    Cataracts, bilateral    removed by surgery   [place]ive heart failure) ([place]    PACEMAKER & DEFIB   Complication of anesthesia    hypotensive after back surgery in 2006   Depression    Dyslipidemia    Fainted 04/21/06   AT [facility]   GERD (gastroesophageal reflux disease)    Headache(784.0)    Hearing loss    bilateral hearing aids   HLD (hyperlipidemia)    diet controlled    Hypertension    Hypothyroidism    ICD (implantable cardiac defibrillator) in place    pt has pacer/icd   ICD (implantable cardiac  defibrillator), biventricular, in situ    LBBB (left bundle branch block)    Memory loss    [place]mic cardiomyopathy ([place]    Normal coronary arteries    s/p cardiac cath 2007   Pacemaker    ICD [employer]   Syncope    Systolic CHF ([facility]    Vertigo    Wears glasses     Surgical History: Past Surgical History:  Procedure Laterality Date   BACK SURGERY     lumbar fusion    BREAST LUMPECTOMY Left 2008   BREAST SURGERY  2000   LUMP REMOVAL. STAGE 1 CANCER   CARDIAC CATHETERIZATION     CATARACT EXTRACTION     COLONOSCOPY     EYE SURGERY     IMPLANTABLE CARDIOVERTER DEFIBRILLATOR GENERATOR CHANGE N/A 12/18/2012   Procedure: IMPLANTABLE CARDIOVERTER DEFIBRILLATOR GENERATOR CHANGE;  Surgeon: GEvans Lance MD;  [facility];  Service: Cardiovascular;  Laterality: N/A;   JOINT REPLACEMENT  06/14/01   right   LUMBAR FUSION  2006   MASS EXCISION  11/08/2011   Procedure: EXCISION MASS;  Surgeon: FStark Klein MD;  [facility];  Service: General;  Laterality: Left;  Excision Left Thigh Mass   MASTECTOMY W/ SENTINEL NODE BIOPSY Left 06/04/2019   Procedure: LEFT MASTECTOMY WITH SENTINEL LYMPH NODE  BIOPSY;  Surgeon: [name], MD;  Location: [place];  Service: General;  Laterality: Left;   MASTECTOMY, PARTIAL  2008   GOT PACEMAKER AND DEFIB AT THAT TIME   PACEMAKER INSERTION  04/23/06   TOTAL KNEE ARTHROPLASTY  05/17/01   RIGHT KNEE   TOTAL KNEE ARTHROPLASTY Left 11/29/2014   Procedure: TOTAL LEFT KNEE ARTHROPLASTY;  Surgeon: Paralee Cancel, MD;  [facility];  Service: Orthopedics;  Laterality: Left;    Home Medications:  Allergies as of 10/16/2021       Reactions   Iodine Shortness Of Breath   Iodine contrast, CHF , SOB   Shellfish Allergy Shortness Of Breath   Memantine    Malaise, fogginess/ couldn't think   Aspirin Nausea And Vomiting   Codeine Nausea And Vomiting        Medication List        Accurate as of October 16, 2021 10:39 AM. If you have any  questions, ask your nurse or doctor.          acetaminophen 650 MG CR tablet Commonly known as: TYLENOL Take 650 mg by mouth at bedtime.   acetaminophen 500 MG tablet Commonly known as: TYLENOL Take 500 mg by mouth in the morning and at bedtime. 500 mg in the morning and afternoon   albuterol 108 (90 Base) MCG/ACT inhaler Commonly known as: VENTOLIN HFA Inhale 2 puffs into the lungs every 6 (six) hours as needed for wheezing or shortness of breath.   ascorbic acid 500 MG tablet Commonly known as: VITAMIN C Take 500 mg by mouth daily.   buPROPion 150 MG 24 hr tablet Commonly known as: WELLBUTRIN XL Take 1 tablet (150 mg total) by mouth daily.   carvedilol 3.125 MG tablet Commonly known as: COREG TAKE 1 TABLET BY MOUTH  TWICE DAILY   diclofenac Sodium 1 % Gel Commonly known as: VOLTAREN Apply 4 g topically 4 (four) times daily.   DULoxetine 60 MG capsule Commonly known as: CYMBALTA Take 60 mg by mouth daily.   famotidine 10 MG tablet Commonly known as: PEPCID Take 10 mg by mouth as needed for heartburn or indigestion.   FISH OIL PO Take 1 capsule by mouth daily.   fluticasone 50 MCG/ACT nasal spray Commonly known as: FLONASE Place 2 sprays into both nostrils daily.   furosemide 20 MG tablet Commonly known as: LASIX Take 1 tablet (20 mg total) by mouth daily as needed.   Gemtesa 75 MG Tabs Generic drug: Vibegron Take 75 mg by mouth daily.   levothyroxine 100 MCG tablet Commonly known as: SYNTHROID Take 1 tablet (100 mcg total) by mouth daily before breakfast.   lidocaine 4 % Apply 1 patch topically every 12 (twelve) hours. Apply to back   Magnesium 250 MG Tabs Take 250 mg by mouth daily.   meclizine 12.5 MG tablet Commonly known as: ANTIVERT Take 1 tablet (12.5 mg total) by mouth 3 (three) times daily as needed for dizziness.   NONFORMULARY OR COMPOUNDED ITEM Apply 120 Tubes topically daily.  Diclofenac/Cyclobenzaprine/lamotrigine/lidocaine/prilocaine (10480) 2%/2%/6%/5%/1.25% cream QTY: 120 GM SIG: (NEURO) APPLY 1-2 PUMPS (1-2 GMS) TO AFFECTED AREA (S) OR FOCAL POINTS 3 TO 4 TIMES DAILY. RUB IN FOR 2 MINUTES TO ACHIEVE MAX PENETRATION. Colon HANDS WELL.   omeprazole 40 MG capsule Commonly known as: PRILOSEC Take 1 capsule (40 mg total) by mouth daily.   OXcarbazepine 150 MG tablet Commonly known as: TRILEPTAL Take 1 tablet (150 mg total) by mouth daily.   polyethylene  glycol powder 17 GM/SCOOP powder Commonly known as: GLYCOLAX/MIRALAX Take 1 Container by mouth daily.   pregabalin 25 MG capsule Commonly known as: LYRICA Take 25 mg by mouth 2 (two) times daily. 1 in the AM and 1 at night for back/nerve pain.   senna-docusate 8.6-50 MG tablet Commonly known as: Senokot S Take 2 tablets by mouth daily.   spironolactone 25 MG tablet Commonly known as: ALDACTONE TAKE 1 TABLET BY MOUTH IN  THE MORNING   telmisartan 20 MG tablet Commonly known as: MICARDIS Take 1 tablet (20 mg total) by mouth daily.   Vitamin D3 50 MCG (2000 UT) Tabs Take 2,000 Units by mouth daily.   Vitamin E 400 units Tabs Take 400 Units by mouth daily.        Allergies:  Allergies  Allergen Reactions   Iodine Shortness Of Breath    Iodine contrast, CHF , SOB   Shellfish Allergy Shortness Of Breath   Memantine     Malaise, fogginess/ couldn't think   Aspirin Nausea And Vomiting   Codeine Nausea And Vomiting    Family History: Family History  Problem Relation Age of Onset   Hypertension Mother    Arthritis Mother    Hypertension Father    Hypertension Brother    Hypertension Brother     Social History:  reports that she has never smoked. She has never been exposed to tobacco smoke. She has never used smokeless tobacco. She reports that she does not drink alcohol and does not use drugs.  ROS:                                        Physical Exam: LMP   (LMP Unknown)   Constitutional:  Alert and oriented, No acute distress. HEENT: [place] AT, moist mucus membranes.  Trachea midline, no masses.   Laboratory Data: Lab Results  Component Value Date   WBC 5.3 07/07/2021   HGB 12.5 07/07/2021   HCT 38.1 07/07/2021   MCV 88.8 07/07/2021   PLT 143 07/07/2021    Lab Results  Component Value Date   CREATININE 0.82 10/04/2021    No results found for: "PSA"  No results found for: "TESTOSTERONE"  No results found for: "HGBA1C"  Urinalysis    Component Value Date/Time   COLORURINE YELLOW 09/18/2020 1220   APPEARANCEUR Clear 06/12/2021 1003   LABSPEC 1.016 09/18/2020 1220   PHURINE 7.0 09/18/2020 1220   GLUCOSEU Negative 06/12/2021 1003   HGBUR NEGATIVE 09/18/2020 1220   BILIRUBINUR Negative 06/12/2021 1003   KETONESUR NEGATIVE 09/18/2020 1220   PROTEINUR Negative 06/12/2021 1003   PROTEINUR NEGATIVE 09/18/2020 1220   UROBILINOGEN 0.2 12/16/2018 1610   UROBILINOGEN 0.2 11/22/2014 1241   NITRITE Negative 06/12/2021 1003   NITRITE NEGATIVE 09/18/2020 1220   LEUKOCYTESUR Negative 06/12/2021 1003   LEUKOCYTESUR NEGATIVE 09/18/2020 1220    Pertinent Imaging:   Assessment & Plan: The role of antimuscarinics with her above history of dementia discussed.  Role of percutaneous tibial nerve stimulation discussed.  Family does not want to try an antimuscarinic.  Unfortunately she has a pacemaker and we discussed this.  She want to try Myrbetriq.  I gave her much of samples.  When she comes back in 6 or 7 weeks I will mention Botox recognizing she may not be ideal.  In my opinion she is not a good candidate for InterStim  1. OAB (  overactive bladder)  - Urinalysis, Complete   No follow-ups on file.  Reece Packer, MD  [place] [address] [place], [place] [address] [phone]

## 2021-10-17 LAB — URINALYSIS, COMPLETE
Bilirubin, UA: NEGATIVE
Glucose, UA: NEGATIVE
Ketones, UA: NEGATIVE
Nitrite, UA: NEGATIVE
Protein,UA: NEGATIVE
RBC, UA: NEGATIVE
Specific Gravity, UA: 1.01 (ref 1.005–1.030)
Urobilinogen, Ur: 0.2 mg/dL (ref 0.2–1.0)
pH, UA: 6 (ref 5.0–7.5)

## 2021-10-17 LAB — MICROSCOPIC EXAMINATION: RBC, Urine: NONE SEEN /hpf (ref 0–2)

## 2021-10-19 ENCOUNTER — Telehealth: Payer: Self-pay | Admitting: Hematology

## 2021-10-19 LAB — CULTURE, URINE COMPREHENSIVE

## 2021-10-19 NOTE — Telephone Encounter (Signed)
Rescheduled upcoming appointment due to provider's template. Patient is aware of changes. 

## 2021-10-20 ENCOUNTER — Telehealth: Payer: Self-pay | Admitting: *Deleted

## 2021-10-20 NOTE — Telephone Encounter (Signed)
Initiated Prior Authorization through [facility] Portal:    [place] Approved Your authorization request number is [id number]. If you need to add a new specialty drug covered under the medical benefit, you will need to submit a new authorization request.  Authorization Status Approved Authorization Number [id number]  Authorization Start Date 10-20-2021 Authorization End Date 10-21-2022 [facility] Name Drug Code Authorization Status Injection, denosumab, 1 mg D7824 Approved Authorization Request Member InformationMember Information Full Name      [name] Gender      Female Date of Birth      [birth year]  Subscriber ID      [id number] Group ID      [id number] Relationship      Subscriber/Recipient Requesting ProviderRequesting Provider Provider DetailsProvider Details Provider First Name      [facility] Provider Last Name      [facility] Provider TIN      [id number] Provider NPI      [id number] Provider Address      [place], [place] [address] Provider Phone Number      [phone] Provider Fax Number      [phone] Provider Email      Provider Cell Phone      Point of ContactPoint of Contact Full Name      Rafael Bihari, [place] Phone Number      [phone] Fax Number      [phone] Email      Servicing ProviderServicing Provider/Pharmacy Same as requesting provider      Request DetailsRequest Details Patient DetailsPatient Details Height of the Patient      58 in Weight of the Patient      52 Kg Patient Contact Number      [phone]  Service DetailsService Details Initial Diagnosis Date      08-2017 Authorization Start Date      10-20-2021  Clinical DetailsClinical Details New to Therapy or Continuation Therapy      Continuation Therapy Disease State      Postmenopausal patients with osteoporosis or to increase bone mass in patients with osteoporosis at high risk for  fracture Primary ICD-10 Code      M81.0 - Age-related osteoporosis without current pathological fracture Additional ICD-10 Code      Place of Service      Outpatient Facility Drug Class      Osteoporosis Drug Description      Prolia (INJECTION DENOSUMAB 1 MG)  Specialty Pharmacy Drug InformationSpecialty Pharmacy Drug Information Medical Benefit Drug Name (including packaging options) Number of Doses per administration Drug Dosage Frequency Total # Cycles G9211 INJECTION DENOSUMAB 1 MG 1 60 mg Every 2 Other 2

## 2021-10-23 ENCOUNTER — Ambulatory Visit (INDEPENDENT_AMBULATORY_CARE_PROVIDER_SITE_OTHER): Payer: Medicare Other | Admitting: Nurse Practitioner

## 2021-10-23 ENCOUNTER — Encounter: Payer: Self-pay | Admitting: Nurse Practitioner

## 2021-10-23 VITALS — BP 118/70 | HR 72 | Temp 97.1°F | Ht 59.0 in | Wt 114.6 lb

## 2021-10-23 DIAGNOSIS — F3181 Bipolar II disorder: Secondary | ICD-10-CM | POA: Diagnosis not present

## 2021-10-23 DIAGNOSIS — N3281 Overactive bladder: Secondary | ICD-10-CM

## 2021-10-23 DIAGNOSIS — E871 Hypo-osmolality and hyponatremia: Secondary | ICD-10-CM | POA: Diagnosis not present

## 2021-10-23 DIAGNOSIS — I10 Essential (primary) hypertension: Secondary | ICD-10-CM | POA: Diagnosis not present

## 2021-10-23 DIAGNOSIS — R053 Chronic cough: Secondary | ICD-10-CM

## 2021-10-23 NOTE — Progress Notes (Signed)
Careteam: Patient Care Team: [name], NP as PCP - General (Geriatric Medicine) [name], MD as PCP - Electrophysiology (Cardiology) [name], MD as Consulting Physician (General Surgery) Paralee Cancel, MD as Consulting Physician (Orthopedic Surgery) Marica Otter, [place] (Optometry) Marcial Pacas, MD as Consulting Physician (Neurology) [name], MD as Consulting Physician (Cardiology) Ricard Dillon, MD (Psychiatry)  PLACE OF SERVICE:  [facility]  Advanced Directive information    Allergies  Allergen Reactions   Iodine Shortness Of Breath    Iodine contrast, CHF , SOB   Shellfish Allergy Shortness Of Breath   Memantine     Malaise, fogginess/ couldn't think   Aspirin Nausea And Vomiting   Codeine Nausea And Vomiting    Chief Complaint  Patient presents with   Medical Management of Chronic Issues    Patient presents today for a 4 weeks follow-up       HPI: Patient is a 88 y.o. female for blood pressure check. Last week she started back on myrbetriq for OAB she feels like this is not working well either.   Last visit lisinopril was stopped due to cough and this has improved.  Overall home blood pressure readings are in good range.   She has titrated off trileptal. She has not noticed any difference in mood being off medication.   Following with pain management- she has now beeing started on naltrexone 4 mg for pain. Reports pain is not as sharp.   Review of Systems:  Review of Systems  Constitutional:  Negative for chills, fever and weight loss.  HENT:  Negative for tinnitus.   Respiratory:  Negative for cough, sputum production and shortness of breath.   Cardiovascular:  Negative for chest pain, palpitations and leg swelling.  Gastrointestinal:  Negative for abdominal pain, constipation, diarrhea and heartburn.  Genitourinary:  Positive for frequency. Negative for dysuria and urgency.  Musculoskeletal:  Positive for back pain.  Negative for falls, joint pain and myalgias.  Skin: Negative.   Neurological:  Negative for dizziness and headaches.  Psychiatric/Behavioral:  Negative for depression and memory loss. The patient does not have insomnia.   ***  Past Medical History:  Diagnosis Date   Arthritis    Cancer ([place])    left breast cancer    Cataracts, bilateral    removed by surgery   CHF (congestive heart failure) ([place])    PACEMAKER & DEFIB   Complication of anesthesia    hypotensive after back surgery in 2006   Depression    Dyslipidemia    Fainted 04/21/06   AT [facility]   GERD (gastroesophageal reflux disease)    Headache(784.0)    Hearing loss    bilateral hearing aids   HLD (hyperlipidemia)    diet controlled    Hypertension    Hypothyroidism    ICD (implantable cardiac defibrillator) in place    pt has pacer/icd   ICD (implantable cardiac defibrillator), biventricular, in situ    LBBB (left bundle branch block)    Memory loss    Nonischemic cardiomyopathy ([place])    Normal coronary arteries    s/p cardiac cath 2007   Pacemaker    ICD [employer]   Syncope    Systolic CHF [facility])    Vertigo    Wears glasses    Past Surgical History:  Procedure Laterality Date   BACK SURGERY     lumbar fusion    BREAST LUMPECTOMY Left 2008   BREAST SURGERY  2000  LUMP REMOVAL. STAGE 1 CANCER   CARDIAC CATHETERIZATION     CATARACT EXTRACTION     COLONOSCOPY     EYE SURGERY     IMPLANTABLE CARDIOVERTER DEFIBRILLATOR GENERATOR CHANGE N/A 12/18/2012   Procedure: IMPLANTABLE CARDIOVERTER DEFIBRILLATOR GENERATOR CHANGE;  Surgeon: [name], MD;  Location: [facility] CATH LAB;  Service: Cardiovascular;  Laterality: N/A;   JOINT REPLACEMENT  06/14/01   right   LUMBAR FUSION  2006   MASS EXCISION  11/08/2011   Procedure: EXCISION MASS;  Surgeon: [name], MD;  Location: [facility];  Service: General;  Laterality: Left;  Excision Left Thigh Mass   MASTECTOMY W/ SENTINEL NODE BIOPSY Left 06/04/2019   Procedure:  LEFT MASTECTOMY WITH SENTINEL LYMPH NODE BIOPSY;  Surgeon: [name], MD;  Location: [place];  Service: General;  Laterality: Left;   MASTECTOMY, PARTIAL  2008   GOT PACEMAKER AND DEFIB AT THAT TIME   PACEMAKER INSERTION  04/23/06   TOTAL KNEE ARTHROPLASTY  05/17/01   RIGHT KNEE   TOTAL KNEE ARTHROPLASTY Left 11/29/2014   Procedure: TOTAL LEFT KNEE ARTHROPLASTY;  Surgeon: Paralee Cancel, MD;  [facility];  Service: Orthopedics;  Laterality: Left;   Social History:   reports that she has never smoked. She has never been exposed to tobacco smoke. She has never used smokeless tobacco. She reports that she does not drink alcohol and does not use drugs.  Family History  Problem Relation Age of Onset   Hypertension Mother    Arthritis Mother    Hypertension Father    Hypertension Brother    Hypertension Brother     Medications: Patient's Medications  New Prescriptions   No medications on file  Previous Medications   ACETAMINOPHEN (TYLENOL) 500 MG TABLET    Take 500 mg by mouth in the morning and at bedtime. 500 mg in the morning and afternoon   ACETAMINOPHEN (TYLENOL) 650 MG CR TABLET    Take 650 mg by mouth at bedtime.   ALBUTEROL (VENTOLIN HFA) 108 (90 BASE) MCG/ACT INHALER    Inhale 2 puffs into the lungs every 6 (six) hours as needed for wheezing or shortness of breath.   ASCORBIC ACID (VITAMIN C) 500 MG TABLET    Take 500 mg by mouth daily.   BUPROPION (WELLBUTRIN XL) 150 MG 24 HR TABLET    Take 1 tablet (150 mg total) by mouth daily.   CARVEDILOL (COREG) 3.125 MG TABLET    TAKE 1 TABLET BY MOUTH  TWICE DAILY   CHOLECALCIFEROL (VITAMIN D3) 50 MCG (2000 UT) TABS    Take 2,000 Units by mouth daily.    DICLOFENAC SODIUM (VOLTAREN) 1 % GEL    Apply 4 g topically 4 (four) times daily.   DULOXETINE (CYMBALTA) 60 MG CAPSULE    Take 60 mg by mouth daily.   FAMOTIDINE (PEPCID) 10 MG TABLET    Take 10 mg by mouth as needed for heartburn or indigestion.   FLUTICASONE (FLONASE) 50 MCG/ACT  NASAL SPRAY    Place 2 sprays into both nostrils daily.   FUROSEMIDE (LASIX) 20 MG TABLET    Take 1 tablet (20 mg total) by mouth daily as needed.   LEVOTHYROXINE (SYNTHROID, LEVOTHROID) 100 MCG TABLET    Take 1 tablet (100 mcg total) by mouth daily before breakfast.   LIDOCAINE 4 % PTCH    Apply 1 patch topically every 12 (twelve) hours. Apply to back   MAGNESIUM 250 MG TABS    Take 250 mg  by mouth daily.   MECLIZINE (ANTIVERT) 12.5 MG TABLET    Take 1 tablet (12.5 mg total) by mouth 3 (three) times daily as needed for dizziness.   MIRABEGRON ER (MYRBETRIQ) 50 MG TB24 TABLET    Take 1 tablet (50 mg total) by mouth daily.   NALTREXONE HCL PO    Take 4 mg by mouth at bedtime.   NONFORMULARY OR COMPOUNDED ITEM    Apply 120 Tubes topically daily. Diclofenac/Cyclobenzaprine/lamotrigine/lidocaine/prilocaine (10480) 2%/2%/6%/5%/1.25% cream QTY: 120 GM SIG: (NEURO) APPLY 1-2 PUMPS (1-2 GMS) TO AFFECTED AREA (S) OR FOCAL POINTS 3 TO 4 TIMES DAILY. RUB IN FOR 2 MINUTES TO ACHIEVE MAX PENETRATION. [place] HANDS WELL.   OMEGA-3 FATTY ACIDS (FISH OIL PO)    Take 1 capsule by mouth daily.   OMEPRAZOLE (PRILOSEC) 40 MG CAPSULE    Take 1 capsule (40 mg total) by mouth daily.   POLYETHYLENE GLYCOL POWDER (GLYCOLAX/MIRALAX) 17 GM/SCOOP POWDER    Take 1 Container by mouth daily.   PREGABALIN (LYRICA) 25 MG CAPSULE    Take 25 mg by mouth 2 (two) times daily. 1 in the AM and 1 at night for back/nerve pain.   SENNA-DOCUSATE (SENOKOT S) 8.6-50 MG TABLET    Take 2 tablets by mouth daily.   SPIRONOLACTONE (ALDACTONE) 25 MG TABLET    TAKE 1 TABLET BY MOUTH IN  THE MORNING   TELMISARTAN (MICARDIS) 20 MG TABLET    Take 1 tablet (20 mg total) by mouth daily.   VITAMIN E 400 UNITS TABS    Take 400 Units by mouth daily.   Modified Medications   No medications on file  Discontinued Medications   OXCARBAZEPINE (TRILEPTAL) 150 MG TABLET    Take 1 tablet (150 mg total) by mouth daily.    Physical Exam:  Vitals:   10/23/21  1444  BP: 118/70  Pulse: 72  Temp: (!) 97.1 F (36.2 C)  SpO2: 97%  Weight: 114 lb 9.6 oz (52 kg)  Height: '4\' 11"'$  (1.499 m)   Body mass index is 23.15 kg/m. Wt Readings from Last 3 Encounters:  10/23/21 114 lb 9.6 oz (52 kg)  10/02/21 116 lb (52.6 kg)  09/25/21 116 lb (52.6 kg)    Physical Exam Constitutional:      General: She is not in acute distress.    Appearance: She is well-developed. She is not diaphoretic.  HENT:     Head: Normocephalic and atraumatic.     Mouth/Throat:     Pharynx: No oropharyngeal exudate.  Eyes:     Conjunctiva/sclera: Conjunctivae normal.     Pupils: Pupils are equal, round, and reactive to light.  Cardiovascular:     Rate and Rhythm: Normal rate and regular rhythm.     Heart sounds: Normal heart sounds.  Pulmonary:     Effort: Pulmonary effort is normal.     Breath sounds: Normal breath sounds.  Abdominal:     General: Bowel sounds are normal.     Palpations: Abdomen is soft.  Musculoskeletal:     Cervical back: Normal range of motion and neck supple.     Right lower leg: No edema.     Left lower leg: No edema.  Skin:    General: Skin is warm and dry.  Neurological:     Mental Status: She is alert. Mental status is at baseline.  Psychiatric:        Mood and Affect: Mood normal.     Labs reviewed: Basic Metabolic Panel: Recent  Labs    11/17/20 0808 12/21/20 1009 06/02/21 0839 06/19/21 1015 07/07/21 0951 09/25/21 1502 10/04/21 1036  NA 139   < > 138   < > 132* 135 133*  K 4.4   < > 4.9   < > 5.0 4.6 4.2  CL 104   < > 100   < > 98 99 97*  CO2 28   < > 32   < > '25 30 26  '$ GLUCOSE 95   < > 103   < > 84 104 101  BUN 24   < > 28*   < > '22 24 16  '$ CREATININE 0.70   < > 1.04*   < > 0.82 0.72 0.82  CALCIUM 9.8   < > 11.1*   < > 9.7 10.7* 10.3  TSH 0.67  --  0.81  --   --   --   --    < > = values in this interval not displayed.   Liver Function Tests: Recent Labs    12/21/20 1009 06/02/21 0839 06/20/21 0945 07/07/21 0951   AST '24 26 22 29  '$ ALT '21 20 18 18  '$ ALKPHOS 53  --  60  --   BILITOT 0.3 0.4 0.4 0.4  PROT 7.3 7.2 7.0 7.2  ALBUMIN 3.9  --  4.0  --    No results for input(s): "LIPASE", "AMYLASE" in the last 8760 hours. No results for input(s): "AMMONIA" in the last 8760 hours. CBC: Recent Labs    06/20/21 0945 07/02/21 1034 07/07/21 0951  WBC 6.6 6.7 5.3  NEUTROABS 4.8 4.5 3,344  HGB 13.0 12.1 12.5  HCT 38.2 35.9* 38.1  MCV 86.4 88.6 88.8  PLT 117* 145* 143   Lipid Panel: Recent Labs    11/17/20 0808  CHOL 171  HDL 63  LDLCALC 90  TRIG 86  CHOLHDL 2.7   TSH: Recent Labs    11/17/20 0808 06/02/21 0839  TSH 0.67 0.81   A1C: No results found for: "HGBA1C"   Assessment/Plan 1. Low sodium levels *** - BASIC METABOLIC PANEL WITH GFR  2. Hypercalcemia -she has made dietary modifications, will follow up.  - BASIC METABOLIC PANEL WITH GFR  3. Essential hypertension -Blood pressure well controlled Continue current medications Recheck metabolic panel - BASIC METABOLIC PANEL WITH GFR  4. Bipolar II disorder (HCC) ***  5. Chronic cough ***  6. Overactive bladder ***   Return in about 4 months (around 02/23/2022) for routine follow up .: *** Quadir Muns K. [place], [facility] [phone]

## 2021-10-23 NOTE — Telephone Encounter (Signed)
ERROR

## 2021-10-24 LAB — BASIC METABOLIC PANEL WITH GFR
BUN: 21 mg/dL (ref 7–25)
CO2: 27 mmol/L (ref 20–32)
Calcium: 10.9 mg/dL — ABNORMAL HIGH (ref 8.6–10.4)
Chloride: 97 mmol/L — ABNORMAL LOW (ref 98–110)
Creat: 0.81 mg/dL (ref 0.60–0.95)
Glucose, Bld: 116 mg/dL (ref 65–139)
Potassium: 4.3 mmol/L (ref 3.5–5.3)
Sodium: 132 mmol/L — ABNORMAL LOW (ref 135–146)
eGFR: 71 mL/min/{1.73_m2} (ref >=60)

## 2021-11-02 ENCOUNTER — Ambulatory Visit: Payer: Medicare Other

## 2021-11-03 ENCOUNTER — Ambulatory Visit: Payer: Medicare Other | Admitting: Physician Assistant

## 2021-11-06 ENCOUNTER — Ambulatory Visit (INDEPENDENT_AMBULATORY_CARE_PROVIDER_SITE_OTHER): Payer: Medicare Other | Admitting: Adult Health

## 2021-11-06 ENCOUNTER — Encounter: Payer: Self-pay | Admitting: Adult Health

## 2021-11-06 VITALS — BP 110/58 | HR 74 | Temp 97.9°F | Resp 18 | Ht 59.0 in | Wt 114.0 lb

## 2021-11-06 DIAGNOSIS — R350 Frequency of micturition: Secondary | ICD-10-CM

## 2021-11-06 DIAGNOSIS — I10 Essential (primary) hypertension: Secondary | ICD-10-CM | POA: Diagnosis not present

## 2021-11-06 DIAGNOSIS — N3281 Overactive bladder: Secondary | ICD-10-CM | POA: Diagnosis not present

## 2021-11-06 NOTE — Patient Instructions (Signed)

## 2021-11-06 NOTE — Progress Notes (Unsigned)
[facility] clinic  Provider:   Code Status: ***  Goals of Care:     08/14/2021    1:10 PM  Advanced Directives  Does Patient Have a Medical Advance Directive? Yes  Type of Advance Directive Living will;Out of facility DNR (pink MOST or yellow form)  Does patient want to make changes to medical advance directive? No - Patient declined     Chief Complaint  Patient presents with   Acute Visit    Patient is here for possible uti    HPI: Patient is a 88 y.o. female seen today for an acute visit for  Past Medical History:  Diagnosis Date   Arthritis    Cancer ([place])    left breast cancer    Cataracts, bilateral    removed by surgery   CHF (congestive heart failure) ([place])    PACEMAKER & DEFIB   Complication of anesthesia    hypotensive after back surgery in 2006   Depression    Dyslipidemia    Fainted 04/21/06   AT [facility]   GERD (gastroesophageal reflux disease)    Headache(784.0)    Hearing loss    bilateral hearing aids   HLD (hyperlipidemia)    diet controlled    Hypertension    Hypothyroidism    ICD (implantable cardiac defibrillator) in place    pt has pacer/icd   ICD (implantable cardiac defibrillator), biventricular, in situ    LBBB (left bundle branch block)    Memory loss    Nonischemic cardiomyopathy ([place])    Normal coronary arteries    s/p cardiac cath 2007   Pacemaker    ICD [employer]   Syncope    Systolic CHF [facility])    Vertigo    Wears glasses     Past Surgical History:  Procedure Laterality Date   BACK SURGERY     lumbar fusion    BREAST LUMPECTOMY Left 2008   BREAST SURGERY  2000   LUMP REMOVAL. STAGE 1 CANCER   CARDIAC CATHETERIZATION     CATARACT EXTRACTION     COLONOSCOPY     EYE SURGERY     IMPLANTABLE CARDIOVERTER DEFIBRILLATOR GENERATOR CHANGE N/A 12/18/2012   Procedure: IMPLANTABLE CARDIOVERTER DEFIBRILLATOR GENERATOR CHANGE;  Surgeon: [name], MD;  Location[facility] C[facility];  Service: Cardiovascular;  Laterality: N/A;    JOINT REPLACEMENT  06/14/01   right   LUMBAR FUSION  2006   MASS EXCISION  11/08/2011   Procedure: EXCISION MASS;  Surgeon: [name], MD;  [facility];  Service: General;  Laterality: Left;  Excision Left Thigh Mass   MASTECTOMY W/ SENTINEL NODE BIOPSY Left 06/04/2019   Procedure: LEFT MASTECTOMY WITH SENTINEL LYMPH NODE BIOPSY;  Surgeon: [name], MD;  Location: [place];  Service: General;  Laterality: Left;   MASTECTOMY, PARTIAL  2008   GOT PACEMAKER AND DEFIB AT THAT TIME   PACEMAKER INSERTION  04/23/06   TOTAL KNEE ARTHROPLASTY  05/17/01   RIGHT KNEE   TOTAL KNEE ARTHROPLASTY Left 11/29/2014   Procedure: TOTAL LEFT KNEE ARTHROPLASTY;  Surgeon: Paralee Cancel, MD;  [facility];  Service: Orthopedics;  Laterality: Left;    Allergies  Allergen Reactions   Iodine Shortness Of Breath    Iodine contrast, CHF , SOB   Shellfish Allergy Shortness Of Breath   Memantine     Malaise, fogginess/ couldn't think   Aspirin Nausea And Vomiting   Codeine Nausea And Vomiting    Outpatient Encounter Medications as  of 11/06/2021  Medication Sig   acetaminophen (TYLENOL) 500 MG tablet Take 500 mg by mouth in the morning and at bedtime. 500 mg in the morning and afternoon   acetaminophen (TYLENOL) 650 MG CR tablet Take 650 mg by mouth at bedtime.   albuterol (VENTOLIN HFA) 108 (90 Base) MCG/ACT inhaler Inhale 2 puffs into the lungs every 6 (six) hours as needed for wheezing or shortness of breath.   ascorbic acid (VITAMIN C) 500 MG tablet Take 500 mg by mouth daily.   buPROPion (WELLBUTRIN XL) 150 MG 24 hr tablet Take 1 tablet (150 mg total) by mouth daily.   carvedilol (COREG) 3.125 MG tablet TAKE 1 TABLET BY MOUTH  TWICE DAILY   Cholecalciferol (VITAMIN D3) 50 MCG (2000 UT) TABS Take 2,000 Units by mouth daily.    diclofenac Sodium (VOLTAREN) 1 % GEL Apply 4 g topically 4 (four) times daily.   DULoxetine (CYMBALTA) 60 MG capsule Take 60 mg by mouth daily.   famotidine (PEPCID) 10 MG tablet  Take 10 mg by mouth as needed for heartburn or indigestion.   fluticasone (FLONASE) 50 MCG/ACT nasal spray Place 2 sprays into both nostrils daily.   furosemide (LASIX) 20 MG tablet Take 1 tablet (20 mg total) by mouth daily as needed.   levothyroxine (SYNTHROID, LEVOTHROID) 100 MCG tablet Take 1 tablet (100 mcg total) by mouth daily before breakfast.   Lidocaine 4 % PTCH Apply 1 patch topically every 12 (twelve) hours. Apply to back   Magnesium 250 MG TABS Take 250 mg by mouth daily.   meclizine (ANTIVERT) 12.5 MG tablet Take 1 tablet (12.5 mg total) by mouth 3 (three) times daily as needed for dizziness.   mirabegron ER (MYRBETRIQ) 50 MG TB24 tablet Take 1 tablet (50 mg total) by mouth daily.   NALTREXONE HCL PO Take 4 mg by mouth at bedtime.   NONFORMULARY OR COMPOUNDED ITEM Apply 120 Tubes topically daily. Diclofenac/Cyclobenzaprine/lamotrigine/lidocaine/prilocaine (10480) 2%/2%/6%/5%/1.25% cream QTY: 120 GM SIG: (NEURO) APPLY 1-2 PUMPS (1-2 GMS) TO AFFECTED AREA (S) OR FOCAL POINTS 3 TO 4 TIMES DAILY. RUB IN FOR 2 MINUTES TO ACHIEVE MAX PENETRATION. [place] HANDS WELL.   Omega-3 Fatty Acids (FISH OIL PO) Take 1 capsule by mouth daily.   omeprazole (PRILOSEC) 40 MG capsule Take 1 capsule (40 mg total) by mouth daily.   polyethylene glycol powder (GLYCOLAX/MIRALAX) 17 GM/SCOOP powder Take 1 Container by mouth daily.   pregabalin (LYRICA) 25 MG capsule Take 25 mg by mouth 2 (two) times daily. 1 in the AM and 1 at night for back/nerve pain.   senna-docusate (SENOKOT S) 8.6-50 MG tablet Take 2 tablets by mouth daily.   spironolactone (ALDACTONE) 25 MG tablet TAKE 1 TABLET BY MOUTH IN  THE MORNING   telmisartan (MICARDIS) 20 MG tablet Take 1 tablet (20 mg total) by mouth daily.   telmisartan (MICARDIS) 20 MG tablet Take 1 tablet by mouth daily.   Vitamin E 400 units TABS Take 400 Units by mouth daily.    No facility-administered encounter medications on file as of 11/06/2021.    Review of  Systems:  Review of Systems  Constitutional:  Negative for appetite change, chills, fatigue and fever.  HENT:  Negative for congestion, hearing loss, rhinorrhea and sore throat.   Eyes: Negative.   Respiratory:  Negative for cough, shortness of breath and wheezing.   Cardiovascular:  Negative for chest pain, palpitations and leg swelling.  Gastrointestinal:  Negative for abdominal pain, constipation, diarrhea, nausea and vomiting.  Genitourinary:  Positive for frequency. Negative for dysuria and hematuria.  Musculoskeletal:  Negative for arthralgias, back pain and myalgias.  Skin:  Negative for color change, rash and wound.  Neurological:  Negative for dizziness, weakness and headaches.  Psychiatric/Behavioral:  Negative for behavioral problems. The patient is not nervous/anxious.     Health Maintenance  Topic Date Due   INFLUENZA VACCINE  11/07/2021   MAMMOGRAM  03/28/2022   TETANUS/TDAP  01/08/2023   Pneumonia Vaccine 22+ Years old  Completed   DEXA SCAN  Completed   COVID-19 Vaccine  Completed   Zoster Vaccines- Shingrix  Completed   HPV VACCINES  Aged Out    Physical Exam: Vitals:   11/06/21 1432  BP: (!) 110/58  Pulse: 74  Resp: 18  Temp: 97.9 F (36.6 C)  SpO2: 98%  Weight: 114 lb (51.7 kg)  Height: '4\' 11"'$  (1.499 m)   Body mass index is 23.03 kg/m. Physical Exam  Labs reviewed: Basic Metabolic Panel: Recent Labs    11/17/20 0808 12/21/20 1009 06/02/21 0839 06/19/21 1015 09/25/21 1502 10/04/21 1036 10/23/21 1528  NA 139   < > 138   < > 135 133* 132*  K 4.4   < > 4.9   < > 4.6 4.2 4.3  CL 104   < > 100   < > 99 97* 97*  CO2 28   < > 32   < > '30 26 27  '$ GLUCOSE 95   < > 103   < > 104 101 116  BUN 24   < > 28*   < > '24 16 21  '$ CREATININE 0.70   < > 1.04*   < > 0.72 0.82 0.81  CALCIUM 9.8   < > 11.1*   < > 10.7* 10.3 10.9*  TSH 0.67  --  0.81  --   --   --   --    < > = values in this interval not displayed.   Liver Function Tests: Recent Labs     12/21/20 1009 06/02/21 0839 06/20/21 0945 07/07/21 0951  AST '24 26 22 29  '$ ALT '21 20 18 18  '$ ALKPHOS 53  --  60  --   BILITOT 0.3 0.4 0.4 0.4  PROT 7.3 7.2 7.0 7.2  ALBUMIN 3.9  --  4.0  --    No results for input(s): "LIPASE", "AMYLASE" in the last 8760 hours. No results for input(s): "AMMONIA" in the last 8760 hours. CBC: Recent Labs    06/20/21 0945 07/02/21 1034 07/07/21 0951  WBC 6.6 6.7 5.3  NEUTROABS 4.8 4.5 3,344  HGB 13.0 12.1 12.5  HCT 38.2 35.9* 38.1  MCV 86.4 88.6 88.8  PLT 117* 145* 143   Lipid Panel: Recent Labs    11/17/20 0808  CHOL 171  HDL 63  LDLCALC 90  TRIG 86  CHOLHDL 2.7   No results found for: "HGBA1C"  Procedures since last visit: No results found.  Assessment/Plan      Labs/tests ordered:  * No order type specified * Next appt:  11/13/2021

## 2021-11-07 LAB — POCT URINALYSIS DIPSTICK
Bilirubin, UA: POSITIVE
Glucose, UA: NEGATIVE
Ketones, UA: NEGATIVE
Nitrite, UA: NEGATIVE
Protein, UA: POSITIVE — AB
Spec Grav, UA: 1.01 (ref 1.010–1.025)
Urobilinogen, UA: 0.2 E.U./dL
pH, UA: 8 (ref 5.0–8.0)

## 2021-11-09 ENCOUNTER — Telehealth: Payer: Self-pay

## 2021-11-09 ENCOUNTER — Other Ambulatory Visit: Payer: Self-pay | Admitting: Adult Health

## 2021-11-09 DIAGNOSIS — R350 Frequency of micturition: Secondary | ICD-10-CM

## 2021-11-09 LAB — URINE CULTURE
MICRO NUMBER:: 13721090
SPECIMEN QUALITY:: ADEQUATE

## 2021-11-09 MED ORDER — CEFDINIR 300 MG PO CAPS: 300.0000 mg | ORAL_CAPSULE | Freq: Two times a day (BID) | ORAL | 0 refills | Status: DC

## 2021-11-09 MED ORDER — SULFAMETHOXAZOLE-TRIMETHOPRIM 800-160 MG PO TABS: 1.0000 | ORAL_TABLET | Freq: Two times a day (BID) | ORAL | 0 refills | Status: AC

## 2021-11-09 NOTE — Telephone Encounter (Signed)
Medina-Vargas, Monina C, NP  [facility] Clinical Pool 56 minutes ago (3:17 PM)   There is no urine culture result yet. I have ordered Cefdinir at [facility] to treat UTI.     Patient notified and agreed.

## 2021-11-09 NOTE — Progress Notes (Signed)
Culture came back and isolate is E. Coli. It is sensitive to Bactrim DS which I have e-prescribed to her pharmacy. I have discontinued Cefdinir.

## 2021-11-09 NOTE — Telephone Encounter (Signed)
Patient called regarding urine culture results. I did not see that they have come back yet. Patient is still having pain and burning on urination. She would like to know if she can get something for treatment while waiting for results.  Message routed to Durenda Age, NP

## 2021-11-13 ENCOUNTER — Ambulatory Visit (INDEPENDENT_AMBULATORY_CARE_PROVIDER_SITE_OTHER): Payer: Medicare Other

## 2021-11-13 DIAGNOSIS — M810 Age-related osteoporosis without current pathological fracture: Secondary | ICD-10-CM | POA: Diagnosis not present

## 2021-11-13 MED ORDER — DENOSUMAB 60 MG/ML ~~LOC~~ SOSY
60.0000 mg | PREFILLED_SYRINGE | Freq: Once | SUBCUTANEOUS | Status: AC
  Administered 2021-11-13: 60 mg via SUBCUTANEOUS

## 2021-11-20 ENCOUNTER — Encounter: Payer: Self-pay | Admitting: Adult Health

## 2021-11-20 ENCOUNTER — Ambulatory Visit (INDEPENDENT_AMBULATORY_CARE_PROVIDER_SITE_OTHER): Payer: Medicare Other | Admitting: Adult Health

## 2021-11-20 ENCOUNTER — Ambulatory Visit
Admission: RE | Admit: 2021-11-20 | Discharge: 2021-11-20 | Disposition: A | Discharge status: Home / Self Care | Payer: Medicare Other | Source: Ambulatory Visit | Attending: Adult Health | Admitting: Adult Health

## 2021-11-20 VITALS — BP 112/62 | HR 61 | Temp 97.3°F | Resp 18 | Ht 59.0 in | Wt 111.2 lb

## 2021-11-20 DIAGNOSIS — N3281 Overactive bladder: Secondary | ICD-10-CM | POA: Diagnosis not present

## 2021-11-20 DIAGNOSIS — R1084 Generalized abdominal pain: Secondary | ICD-10-CM | POA: Diagnosis not present

## 2021-11-20 DIAGNOSIS — R339 Retention of urine, unspecified: Secondary | ICD-10-CM

## 2021-11-20 DIAGNOSIS — N3000 Acute cystitis without hematuria: Secondary | ICD-10-CM

## 2021-11-20 DIAGNOSIS — K5904 Chronic idiopathic constipation: Secondary | ICD-10-CM

## 2021-11-20 LAB — POCT URINALYSIS DIPSTICK
Bilirubin, UA: NEGATIVE
Blood, UA: NEGATIVE
Glucose, UA: NEGATIVE
Ketones, UA: NEGATIVE
Nitrite, UA: NEGATIVE
Odor: NORMAL
Protein, UA: NEGATIVE
Spec Grav, UA: 1.01
Urobilinogen, UA: 0.2 U/dL
pH, UA: 6.5

## 2021-11-20 NOTE — Progress Notes (Signed)
[facility]  Provider:   Durenda Age DNP  Code Status:  DNR  Goals of Care:     08/14/2021    1:10 PM  Advanced Directives  Does Patient Have a Medical Advance Directive? Yes  Type of Advance Directive Living will;Out of facility DNR (pink MOST or yellow form)  Does patient want to make changes to medical advance directive? No - Patient declined     Chief Complaint  Patient presents with   Acute Visit    Possible uti    HPI: Patient is a 88 y.o. female seen today for an acute visit for abdominal pain and urinary retention. She has burning when she urinates. She was seen in November 06, 2021 for UTI and was prescribed Bactrim which she has completed. She denies fevers nor chills nor hematuria. She moved her bowel today and was in "pieces". She takes Miralax 17 gm daily. Colonoscopy was stopped due to age.   Urine dipstick today showed negative nitrate, moderate leukocyte and negative blood. She denies having hematuria, fever, nor chills. She currently takes Myrbetriq ER 50 mg daily for OAB and follows up with urology. She stated that her problem before was having frequent urination and feeling she has urinary retention. Daughter stated that she was having had to urinate every 30 minutes.  Past Medical History:  Diagnosis Date   Arthritis    Cancer ([place])    left breast cancer    Cataracts, bilateral    removed by surgery   CHF (congestive heart failure) ([place])    PACEMAKER & DEFIB   Complication of anesthesia    hypotensive after back surgery in 2006   Depression    Dyslipidemia    Fainted 04/21/06   AT [facility]   GERD (gastroesophageal reflux disease)    Headache(784.0)    Hearing loss    bilateral hearing aids   HLD (hyperlipidemia)    diet controlled    Hypertension    Hypothyroidism    ICD (implantable cardiac defibrillator) in place    pt has pacer/icd   ICD (implantable cardiac defibrillator), biventricular, in situ    LBBB (left bundle branch block)     Memory loss    Nonischemic cardiomyopathy ([place])    Normal coronary arteries    s/p cardiac cath 2007   Pacemaker    ICD [employer]   Syncope    Systolic CHF [facility])    Vertigo    Wears glasses     Past Surgical History:  Procedure Laterality Date   BACK SURGERY     lumbar fusion    BREAST LUMPECTOMY Left 2008   BREAST SURGERY  2000   LUMP REMOVAL. STAGE 1 CANCER   CARDIAC CATHETERIZATION     CATARACT EXTRACTION     COLONOSCOPY     EYE SURGERY     IMPLANTABLE CARDIOVERTER DEFIBRILLATOR GENERATOR CHANGE N/A 12/18/2012   Procedure: IMPLANTABLE CARDIOVERTER DEFIBRILLATOR GENERATOR CHANGE;  Surgeon: [name], MD;  Location: [facility] CATH LAB;  Service: Cardiovascular;  Laterality: N/A;   JOINT REPLACEMENT  06/14/01   right   LUMBAR FUSION  2006   MASS EXCISION  11/08/2011   Procedure: EXCISION MASS;  Surgeon: [name], MD;  Location: [facility];  Service: General;  Laterality: Left;  Excision Left Thigh Mass   MASTECTOMY W/ SENTINEL NODE BIOPSY Left 06/04/2019   Procedure: LEFT MASTECTOMY WITH SENTINEL LYMPH NODE BIOPSY;  Surgeon: [name], MD;  Location: [place];  Service: General;  Laterality:  Left;   MASTECTOMY, PARTIAL  2008   GOT PACEMAKER AND DEFIB AT THAT TIME   PACEMAKER INSERTION  04/23/06   TOTAL KNEE ARTHROPLASTY  05/17/01   RIGHT KNEE   TOTAL KNEE ARTHROPLASTY Left 11/29/2014   Procedure: TOTAL LEFT KNEE ARTHROPLASTY;  Surgeon: Paralee Cancel, MD;  Location: [facility];  Service: Orthopedics;  Laterality: Left;    Allergies  Allergen Reactions   Iodine Shortness Of Breath    Iodine contrast, CHF , SOB   Shellfish Allergy Shortness Of Breath   Memantine     Malaise, fogginess/ couldn't think   Aspirin Nausea And Vomiting   Codeine Nausea And Vomiting    Outpatient Encounter Medications as of 11/20/2021  Medication Sig   acetaminophen (TYLENOL) 500 MG tablet Take 500 mg by mouth in the morning and at bedtime. 500 mg in the morning and afternoon   acetaminophen  (TYLENOL) 650 MG CR tablet Take 650 mg by mouth at bedtime.   albuterol (VENTOLIN HFA) 108 (90 Base) MCG/ACT inhaler Inhale 2 puffs into the lungs every 6 (six) hours as needed for wheezing or shortness of breath.   ascorbic acid (VITAMIN C) 500 MG tablet Take 500 mg by mouth daily.   buPROPion (WELLBUTRIN XL) 150 MG 24 hr tablet Take 1 tablet (150 mg total) by mouth daily.   carvedilol (COREG) 3.125 MG tablet TAKE 1 TABLET BY MOUTH  TWICE DAILY   Cholecalciferol (VITAMIN D3) 50 MCG (2000 UT) TABS Take 2,000 Units by mouth daily.    diclofenac Sodium (VOLTAREN) 1 % GEL Apply 4 g topically 4 (four) times daily.   DULoxetine (CYMBALTA) 60 MG capsule Take 60 mg by mouth daily.   famotidine (PEPCID) 10 MG tablet Take 10 mg by mouth as needed for heartburn or indigestion.   fluticasone (FLONASE) 50 MCG/ACT nasal spray Place 2 sprays into both nostrils daily.   furosemide (LASIX) 20 MG tablet Take 1 tablet (20 mg total) by mouth daily as needed.   levothyroxine (SYNTHROID, LEVOTHROID) 100 MCG tablet Take 1 tablet (100 mcg total) by mouth daily before breakfast.   Lidocaine 4 % PTCH Apply 1 patch topically every 12 (twelve) hours. Apply to back   Magnesium 250 MG TABS Take 250 mg by mouth daily.   meclizine (ANTIVERT) 12.5 MG tablet Take 1 tablet (12.5 mg total) by mouth 3 (three) times daily as needed for dizziness.   mirabegron ER (MYRBETRIQ) 50 MG TB24 tablet Take 1 tablet (50 mg total) by mouth daily.   NALTREXONE HCL PO Take 4 mg by mouth at bedtime.   NONFORMULARY OR COMPOUNDED ITEM Apply 120 Tubes topically daily. Diclofenac/Cyclobenzaprine/lamotrigine/lidocaine/prilocaine (10480) 2%/2%/6%/5%/1.25% cream QTY: 120 GM SIG: (NEURO) APPLY 1-2 PUMPS (1-2 GMS) TO AFFECTED AREA (S) OR FOCAL POINTS 3 TO 4 TIMES DAILY. RUB IN FOR 2 MINUTES TO ACHIEVE MAX PENETRATION. [place] HAND[employer] WELL.   Omega-3 Fatty Acids (FISH OIL PO) Take 1 capsule by mouth daily.   omeprazole (PRILOSEC) 40 MG capsule Take 1 capsule  (40 mg total) by mouth daily.   polyethylene glycol powder (GLYCOLAX/MIRALAX) 17 GM/SCOOP powder Take 1 Container by mouth daily.   pregabalin (LYRICA) 25 MG capsule Take 25 mg by mouth 2 (two) times daily. 1 in the AM and 1 at night for back/nerve pain.   senna-docusate (SENOKOT S) 8.6-50 MG tablet Take 2 tablets by mouth daily.   spironolactone (ALDACTONE) 25 MG tablet TAKE 1 TABLET BY MOUTH IN  THE MORNING   telmisartan (MICARDIS) 20 MG tablet  Take 1 tablet (20 mg total) by mouth daily.   telmisartan (MICARDIS) 20 MG tablet Take 1 tablet by mouth daily.   Vitamin E 400 units TABS Take 400 Units by mouth daily.    No facility-administered encounter medications on file as of 11/20/2021.    Review of Systems:  Review of Systems  Constitutional:  Negative for activity change, chills and fever.  Gastrointestinal:  Negative for abdominal distention, abdominal pain, diarrhea, nausea and vomiting.  Genitourinary:  Positive for dysuria, frequency and urgency. Negative for difficulty urinating, flank pain and hematuria.    Health Maintenance  Topic Date Due   COVID-19 Vaccine (6 - Pfizer risk series) 03/31/2021   INFLUENZA VACCINE  11/07/2021   MAMMOGRAM  03/28/2022   TETANUS/TDAP  01/08/2023   Pneumonia Vaccine 54+ Years old  Completed   DEXA SCAN  Completed   Zoster Vaccines- Shingrix  Completed   HPV VACCINES  Aged Out    Physical Exam: Vitals:   11/20/21 1446  BP: 112/62  Pulse: 61  Resp: 18  Temp: (!) 97.3 F (36.3 C)  TempSrc: Temporal  SpO2: 98%  Weight: 100 lb 6.4 oz (45.5 kg)  Height: '4\' 11"'$  (1.499 m)   Body mass index is 20.28 kg/m. Physical Exam Constitutional:      Appearance: Normal appearance.  HENT:     Head: Normocephalic and atraumatic.     Nose: Nose normal.     Mouth/Throat:     Mouth: Mucous membranes are moist.  Eyes:     Conjunctiva/sclera: Conjunctivae normal.  Cardiovascular:     Rate and Rhythm: Normal rate and regular rhythm.  Pulmonary:      Effort: Pulmonary effort is normal.     Breath sounds: Normal breath sounds.  Abdominal:     General: Bowel sounds are normal.     Palpations: Abdomen is soft.  Musculoskeletal:        General: Normal range of motion.     Cervical back: Normal range of motion.  Skin:    General: Skin is warm and dry.  Neurological:     General: No focal deficit present.     Mental Status: She is alert and oriented to person, place, and time.  Psychiatric:        Mood and Affect: Mood normal.        Behavior: Behavior normal.        Thought Content: Thought content normal.        Judgment: Judgment normal.     Labs reviewed: Basic Metabolic Panel: Recent Labs    06/02/21 0839 06/19/21 1015 09/25/21 1502 10/04/21 1036 10/23/21 1528  NA 138   < > 135 133* 132*  K 4.9   < > 4.6 4.2 4.3  CL 100   < > 99 97* 97*  CO2 32   < > '30 26 27  '$ GLUCOSE 103   < > 104 101 116  BUN 28*   < > '24 16 21  '$ CREATININE 1.04*   < > 0.72 0.82 0.81  CALCIUM 11.1*   < > 10.7* 10.3 10.9*  TSH 0.81  --   --   --   --    < > = values in this interval not displayed.   Liver Function Tests: Recent Labs    12/21/20 1009 06/02/21 0839 06/20/21 0945 07/07/21 0951  AST '24 26 22 29  '$ ALT '21 20 18 18  '$ ALKPHOS 53  --  60  --   BILITOT  0.3 0.4 0.4 0.4  PROT 7.3 7.2 7.0 7.2  ALBUMIN 3.9  --  4.0  --    No results for input(s): "LIPASE", "AMYLASE" in the last 8760 hours. No results for input(s): "AMMONIA" in the last 8760 hours. CBC: Recent Labs    06/20/21 0945 07/02/21 1034 07/07/21 0951  WBC 6.6 6.7 5.3  NEUTROABS 4.8 4.5 3,344  HGB 13.0 12.1 12.5  HCT 38.2 35.9* 38.1  MCV 86.4 88.6 88.8  PLT 117* 145* 143   Lipid Panel: No results for input(s): "CHOL", "HDL", "LDLCALC", "TRIG", "CHOLHDL", "LDLDIRECT" in the last 8760 hours. No results found for: "HGBA1C"  Procedures since last visit: No results found.  Assessment/Plan  1. Urinary retention -  Acute cystitis without hematuria - completed  Bactrim DS -  no hematuria, fever nor chills - POC Urinalysis Dipstick - Culture, Urine  2. Generalized abdominal pain -  denies having constipation - DG Abd 2 Views; Future  3. Overactive bladder -  takes Myrbetriq and follows up with urology  4. Chronic idiopathic constipation -  takes Miralax 17 gm daily  Labs/tests ordered:  KUB stat  Next appt:  02/23/2022

## 2021-11-20 NOTE — Patient Instructions (Signed)

## 2021-11-21 ENCOUNTER — Other Ambulatory Visit: Payer: Self-pay | Admitting: Adult Health

## 2021-11-21 LAB — URINE CULTURE
MICRO NUMBER:: 13776119
Result:: NO GROWTH
SPECIMEN QUALITY:: ADEQUATE

## 2021-11-21 MED ORDER — DICLOFENAC SODIUM 1 % EX GEL: 4.0000 g | Freq: Four times a day (QID) | CUTANEOUS | 2 refills | Status: DC

## 2021-11-24 ENCOUNTER — Telehealth: Payer: Self-pay

## 2021-11-24 ENCOUNTER — Other Ambulatory Visit: Payer: Self-pay

## 2021-11-24 DIAGNOSIS — R1084 Generalized abdominal pain: Secondary | ICD-10-CM

## 2021-11-24 NOTE — Progress Notes (Signed)
Pls take Tylenol 500 mg 2 tabs = 1,000 mg orally twice a day as needed instead of every 8 hours as needed since she takes Tylenol routinely.

## 2021-11-24 NOTE — Addendum Note (Signed)
Addended by: Durenda Age C on: 11/24/2021 11:52 AM   Modules accepted: Orders

## 2021-11-24 NOTE — Telephone Encounter (Signed)
I called [employer] to help the patient with her monitor. They are sending her a cell adapter.

## 2021-11-24 NOTE — Progress Notes (Signed)
She can take Tylenol 500 mg take 2 tabs = 1,000 mg by mouth every every 8 hours as needed for pain. I will send order for chest x-ray since abdominal x-ray showed both lungs showing Insterstitial pneumonia. I am sending order for stool culture for c-difficile as well. Pls obtain specimen kit from the office.

## 2021-11-24 NOTE — Progress Notes (Signed)
Urine culture was negative for UTI

## 2021-11-25 ENCOUNTER — Encounter: Payer: Self-pay | Admitting: Emergency Medicine

## 2021-11-25 ENCOUNTER — Ambulatory Visit
Admission: EM | Admit: 2021-11-25 | Discharge: 2021-11-25 | Disposition: A | Discharge status: Home / Self Care | Payer: Medicare Other | Attending: Urgent Care | Admitting: Urgent Care

## 2021-11-25 DIAGNOSIS — Z87440 Personal history of urinary (tract) infections: Secondary | ICD-10-CM | POA: Diagnosis not present

## 2021-11-25 DIAGNOSIS — K5900 Constipation, unspecified: Secondary | ICD-10-CM

## 2021-11-25 DIAGNOSIS — R1030 Lower abdominal pain, unspecified: Secondary | ICD-10-CM | POA: Diagnosis not present

## 2021-11-25 MED ORDER — FLEET ENEMA 7-19 GM/118ML RE ENEM: 1.0000 | ENEMA | Freq: Every day | RECTAL | 0 refills | Status: DC | PRN

## 2021-11-25 NOTE — ED Provider Notes (Signed)
[place]   MRN: [id number] DOB: Jun 14, 1933  Subjective:   [name] is a 88 y.o. female presenting for 1 week history of persistent intermittent bilateral lower abdominal pain.  Patient was treated for an urinary tract infection at the beginning of this month.  She took her antibiotic course and feels like she cleared it.  She was recently checked again but urine culture results were equivocal.  She is currently undergoing work-up for C. difficile.  She has not been able to provide a stool sample however.  Reports that she thinks that is her primary problem as she has significant constipation and has to strain and sit on the commode for an extensive amount of time to try and have a bowel movement.  Patient states that she last had a very small stool yesterday.  Takes stool softener daily, uses MiraLAX daily.  No history of diverticulosis or diverticulitis.  No fever, nausea, vomiting, bloody stools, diarrhea.  No current facility-administered medications for this encounter.  Current Outpatient Medications:    acetaminophen (TYLENOL) 500 MG tablet, Take 500 mg by mouth in the morning and at bedtime. 500 mg in the morning and afternoon, Disp: , Rfl:    acetaminophen (TYLENOL) 650 MG CR tablet, Take 650 mg by mouth at bedtime., Disp: , Rfl:    albuterol (VENTOLIN HFA) 108 (90 Base) MCG/ACT inhaler, Inhale 2 puffs into the lungs every 6 (six) hours as needed for wheezing or shortness of breath., Disp: 8 g, Rfl: 6   ascorbic acid (VITAMIN C) 500 MG tablet, Take 500 mg by mouth daily., Disp: , Rfl:    buPROPion (WELLBUTRIN XL) 150 MG 24 hr tablet, Take 1 tablet (150 mg total) by mouth daily., Disp: , Rfl:    carvedilol (COREG) 3.125 MG tablet, TAKE 1 TABLET BY MOUTH  TWICE DAILY, Disp: 180 tablet, Rfl: 3   Cholecalciferol (VITAMIN D3) 50 MCG (2000 UT) TABS, Take 2,000 Units by mouth daily. , Disp: , Rfl:    diclofenac Sodium (VOLTAREN) 1 % GEL, Apply 4 g topically  4 (four) times daily., Disp: 100 g, Rfl: 2   DULoxetine (CYMBALTA) 60 MG capsule, Take 60 mg by mouth daily., Disp: , Rfl:    famotidine (PEPCID) 10 MG tablet, Take 10 mg by mouth as needed for heartburn or indigestion., Disp: , Rfl:    fluticasone (FLONASE) 50 MCG/ACT nasal spray, Place 2 sprays into both nostrils daily., Disp: 16 g, Rfl: 3   furosemide (LASIX) 20 MG tablet, Take 1 tablet (20 mg total) by mouth daily as needed., Disp: 90 tablet, Rfl: 3   levothyroxine (SYNTHROID, LEVOTHROID) 100 MCG tablet, Take 1 tablet (100 mcg total) by mouth daily before breakfast., Disp: 90 tablet, Rfl: 3   Lidocaine 4 % PTCH, Apply 1 patch topically every 12 (twelve) hours. Apply to back, Disp: , Rfl:    Magnesium 250 MG TABS, Take 250 mg by mouth daily., Disp: , Rfl:    meclizine (ANTIVERT) 12.5 MG tablet, Take 1 tablet (12.5 mg total) by mouth 3 (three) times daily as needed for dizziness., Disp: 30 tablet, Rfl: 1   mirabegron ER (MYRBETRIQ) 50 MG TB24 tablet, Take 1 tablet (50 mg total) by mouth daily., Disp: 30 tablet, Rfl: 0   NALTREXONE HCL PO, Take 4 mg by mouth at bedtime., Disp: , Rfl:    NONFORMULARY OR COMPOUNDED ITEM, Apply 120 Tubes topically daily. Diclofenac/Cyclobenzaprine/lamotrigine/lidocaine/prilocaine (10480) 2%/2%/6%/5%/1.25% cream QTY: 120 GM SIG: (NEURO) APPLY 1-2 PUMPS (  1-2 GMS) TO AFFECTED AREA (S) OR FOCAL POINTS 3 TO 4 TIMES DAILY. RUB IN FOR 2 MINUTES TO ACHIEVE MAX PENETRATION. [place] HAND[employer] WELL., Disp: 1 each, Rfl: 2   Omega-3 Fatty Acids (FISH OIL PO), Take 1 capsule by mouth daily., Disp: , Rfl:    omeprazole (PRILOSEC) 40 MG capsule, Take 1 capsule (40 mg total) by mouth daily., Disp: 90 capsule, Rfl: 1   polyethylene glycol powder (GLYCOLAX/MIRALAX) 17 GM/SCOOP powder, Take 1 Container by mouth daily., Disp: , Rfl:    pregabalin (LYRICA) 25 MG capsule, Take 25 mg by mouth 2 (two) times daily. 1 in the AM and 1 at night for back/nerve pain., Disp: , Rfl:    senna-docusate  (SENOKOT S) 8.6-50 MG tablet, Take 2 tablets by mouth daily., Disp: 60 tablet, Rfl: 5   spironolactone (ALDACTONE) 25 MG tablet, TAKE 1 TABLET BY MOUTH IN  THE MORNING, Disp: 90 tablet, Rfl: 2   telmisartan (MICARDIS) 20 MG tablet, Take 1 tablet (20 mg total) by mouth daily., Disp: 90 tablet, Rfl: 0   telmisartan (MICARDIS) 20 MG tablet, Take 1 tablet by mouth daily., Disp: , Rfl:    Vitamin E 400 units TABS, Take 400 Units by mouth daily. , Disp: , Rfl:    Allergies  Allergen Reactions   Iodine Shortness Of Breath    Iodine contrast, CHF , SOB   Shellfish Allergy Shortness Of Breath   Memantine     Malaise, fogginess/ couldn't think   Aspirin Nausea And Vomiting   Codeine Nausea And Vomiting    Past Medical History:  Diagnosis Date   Arthritis    Cancer ([place])    left breast cancer    Cataracts, bilateral    removed by surgery   CHF (congestive heart failure) ([place])    PACEMAKER & DEFIB   Complication of anesthesia    hypotensive after back surgery in 2006   Depression    Dyslipidemia    Fainted 04/21/06   AT [facility]   GERD (gastroesophageal reflux disease)    Headache(784.0)    Hearing loss    bilateral hearing aids   HLD (hyperlipidemia)    diet controlled    Hypertension    Hypothyroidism    ICD (implantable cardiac defibrillator) in place    pt has pacer/icd   ICD (implantable cardiac defibrillator), biventricular, in situ    LBBB (left bundle branch block)    Memory loss    Nonischemic cardiomyopathy ([place])    Normal coronary arteries    s/p cardiac cath 2007   Pacemaker    ICD [employer]   Syncope    Systolic CHF [facility])    Vertigo    Wears glasses      Past Surgical History:  Procedure Laterality Date   BACK SURGERY     lumbar fusion    BREAST LUMPECTOMY Left 2008   BREAST SURGERY  2000   LUMP REMOVAL. STAGE 1 CANCER   CARDIAC CATHETERIZATION     CATARACT EXTRACTION     COLONOSCOPY     EYE SURGERY     IMPLANTABLE CARDIOVERTER DEFIBRILLATOR  GENERATOR CHANGE N/A 12/18/2012   Procedure: IMPLANTABLE CARDIOVERTER DEFIBRILLATOR GENERATOR CHANGE;  Surgeon: [name], MD;  Location: [facility] CATH LAB;  Service: Cardiovascular;  Laterality: N/A;   JOINT REPLACEMENT  06/14/01   right   LUMBAR FUSION  2006   MASS EXCISION  11/08/2011   Procedure: EXCISION MASS;  Surgeon: [name], MD;  Location: [facility] ORS;  Service: General;  Laterality: Left;  Excision Left Thigh Mass   MASTECTOMY W/ SENTINEL NODE BIOPSY Left 06/04/2019   Procedure: LEFT MASTECTOMY WITH SENTINEL LYMPH NODE BIOPSY;  Surgeon: [name], MD;  Location: [facility];  Service: General;  Laterality: Left;   MASTECTOMY, PARTIAL  2008   GOT PACEMAKER AND DEFIB AT THAT TIME   PACEMAKER INSERTION  04/23/06   TOTAL KNEE ARTHROPLASTY  05/17/01   RIGHT KNEE   TOTAL KNEE ARTHROPLASTY Left 11/29/2014   Procedure: TOTAL LEFT KNEE ARTHROPLASTY;  Surgeon: Paralee Cancel, MD;  Location: [facility] ORS;  Service: Orthopedics;  Laterality: Left;    Family History  Problem Relation Age of Onset   Hypertension Mother    Arthritis Mother    Hypertension Father    Hypertension Brother    Hypertension Brother     Social History   Tobacco Use   Smoking status: Never    Passive exposure: Never   Smokeless tobacco: Never  Vaping Use   Vaping Use: Never used  Substance Use Topics   Alcohol use: No   Drug use: No    ROS   Objective:   Vitals: BP (!) 143/80 (BP Location: Right Arm)   Pulse 70   Temp (!) 97.5 F (36.4 C) (Oral)   Resp 16   LMP  (LMP Unknown)   SpO2 94%   Physical Exam Constitutional:      General: She is not in acute distress.    Appearance: Normal appearance. She is well-developed. She is not ill-appearing, toxic-appearing or diaphoretic.  HENT:     Head: Normocephalic and atraumatic.     Nose: Nose normal.     Mouth/Throat:     Mouth: Mucous membranes are moist.     Pharynx: Oropharynx is clear.  Eyes:     General: No scleral icterus.       Right eye: No discharge.         Left eye: No discharge.     Extraocular Movements: Extraocular movements intact.     Conjunctiva/sclera: Conjunctivae normal.  Cardiovascular:     Rate and Rhythm: Normal rate.  Pulmonary:     Effort: Pulmonary effort is normal.  Abdominal:     General: Bowel sounds are normal. There is no distension.     Palpations: Abdomen is soft. There is no mass.     Tenderness: There is abdominal tenderness in the right lower quadrant and left lower quadrant. There is no right CVA tenderness, left CVA tenderness, guarding or rebound.  Skin:    General: Skin is warm and dry.  Neurological:     General: No focal deficit present.     Mental Status: She is alert and oriented to person, place, and time.  Psychiatric:        Mood and Affect: Mood normal.        Behavior: Behavior normal.        Thought Content: Thought content normal.        Judgment: Judgment normal.     DG Abd 2 Views  Result Date: 11/20/2021 CLINICAL DATA:  Abdominal pain, dysuria EXAM: ABDOMEN - 2 VIEW COMPARISON:  04/22/2017 FINDINGS: Bowel gas pattern is nonspecific. There is no pneumoperitoneum. Small to moderate amount of stool is seen in colon without signs of fecal impaction in rectosigmoid. No abnormal masses or calcifications are seen. Kidneys are partly obscured by bowel contents. Levoscoliosis is seen in lumbar spine. There is surgical fusion at L4-L5 level. Degenerative changes are noted  in lumbar spine. Pacemaker/defibrillator battery is seen in left infraclavicular region. Biventricular pacer leads are noted in place. Increased interstitial markings are seen in the visualized lower lung fields. IMPRESSION: There is no evidence of intestinal obstruction or pneumoperitoneum. Increased interstitial markings are seen in both lungs suggesting scarring and possibly interstitial edema or interstitial pneumonia. Electronically Signed   By: Elmer Picker M.D.   On: 11/20/2021 16:17    Recent Results (from the past  2160 hour(s))  BASIC METABOLIC PANEL WITH GFR     Status: Abnormal   Collection Time: 09/25/21  3:02 PM  Result Value Ref Range   Glucose, Bld 104 65 - 139 mg/dL    Comment: .        Non-fasting reference interval .    BUN 24 7 - 25 mg/dL   Creat 0.72 0.60 - 0.95 mg/dL   eGFR 82 > OR = 60 mL/min/1.61m    Comment: The eGFR is based on the CKD-EPI 2021 equation. To calculate  the new eGFR from a previous Creatinine or Cystatin C result, go to https://www.kidney.org/professionals/ kdoqi/gfr%5Fcalculator    BUN/Creatinine Ratio NOT APPLICABLE 6 - 22 (calc)   Sodium 135 135 - 146 mmol/L   Potassium 4.6 3.5 - 5.3 mmol/L   Chloride 99 98 - 110 mmol/L   CO2 30 20 - 32 mmol/L   Calcium 10.7 (H) 8.6 - 10.4 mg/dL  BMP with eGFR([facility])     Status: Abnormal   Collection Time: 10/04/21 10:36 AM  Result Value Ref Range   Glucose, Bld 101 65 - 139 mg/dL    Comment: .        Non-fasting reference interval .    BUN 16 7 - 25 mg/dL   Creat 0.82 0.60 - 0.95 mg/dL   eGFR 70 > OR = 60 mL/min/1.731m   Comment: The eGFR is based on the CKD-EPI 2021 equation. To calculate  the new eGFR from a previous Creatinine or Cystatin C result, go to https://www.kidney.org/professionals/ kdoqi/gfr%5Fcalculator    BUN/Creatinine Ratio NOT APPLICABLE 6 - 22 (calc)   Sodium 133 (L) 135 - 146 mmol/L   Potassium 4.2 3.5 - 5.3 mmol/L   Chloride 97 (L) 98 - 110 mmol/L   CO2 26 20 - 32 mmol/L   Calcium 10.3 8.6 - 10.4 mg/dL  Urinalysis, Complete     Status: Abnormal   Collection Time: 10/16/21 10:29 AM  Result Value Ref Range   Specific Gravity, UA 1.010 1.005 - 1.030   pH, UA 6.0 5.0 - 7.5   Color, UA Yellow Yellow   Appearance Ur Clear Clear   Leukocytes,UA 2+ (A) Negative   Protein,UA Negative Negative/Trace   Glucose, UA Negative Negative   Ketones, UA Negative Negative   RBC, UA Negative Negative   Bilirubin, UA Negative Negative   Urobilinogen, Ur 0.2 0.2 - 1.0 mg/dL   Nitrite, UA Negative  Negative   Microscopic Examination See below:   Microscopic Examination     Status: Abnormal   Collection Time: 10/16/21 10:29 AM   Urine  Result Value Ref Range   WBC, UA 6-10 (A) 0 - 5 /hpf   RBC, Urine None seen 0 - 2 /hpf   Epithelial Cells (non renal) 0-10 0 - 10 /hpf   Renal Epithel, UA 0-10 (A) None seen /hpf   Mucus, UA Present (A) Not Estab.   Bacteria, UA Moderate (A) None seen/Few  CULTURE, URINE COMPREHENSIVE     Status: None   Collection Time:  10/16/21 11:16 AM   Specimen: Urine   UR  Result Value Ref Range   Urine Culture, Comprehensive Final report    Organism ID, Bacteria Comment     Comment: Mixed urogenital flora 50,000-100,000 colony forming units per mL   BASIC METABOLIC PANEL WITH GFR     Status: Abnormal   Collection Time: 10/23/21  3:28 PM  Result Value Ref Range   Glucose, Bld 116 65 - 139 mg/dL    Comment: .        Non-fasting reference interval .    BUN 21 7 - 25 mg/dL   Creat 0.81 0.60 - 0.95 mg/dL   eGFR 71 > OR = 60 mL/min/1.90m    Comment: The eGFR is based on the CKD-EPI 2021 equation. To calculate  the new eGFR from a previous Creatinine or Cystatin C result, go to https://www.kidney.org/professionals/ kdoqi/gfr%5Fcalculator    BUN/Creatinine Ratio NOT APPLICABLE 6 - 22 (calc)   Sodium 132 (L) 135 - 146 mmol/L   Potassium 4.3 3.5 - 5.3 mmol/L   Chloride 97 (L) 98 - 110 mmol/L   CO2 27 20 - 32 mmol/L   Calcium 10.9 (H) 8.6 - 10.4 mg/dL  Culture, Urine     Status: Abnormal   Collection Time: 11/07/21  8:29 AM   Specimen: Urine  Result Value Ref Range   MICRO NUMBER: [id number]   SPECIMEN QUALITY: Adequate    Sample Source URINE    STATUS: FINAL    ISOLATE 1: Escherichia coli (A)     Comment: Greater than 100,000 CFU/mL of Escherichia coli      Susceptibility   Escherichia coli - URINE CULTURE, REFLEX    AMOX/CLAVULANIC <=2 Sensitive     AMPICILLIN 4 Sensitive     AMPICILLIN/SULBACTAM <=2 Sensitive     CEFAZOLIN* <=4 Not  Reportable      * For infections other than uncomplicated UTI caused by E. coli, K. pneumoniae or P. mirabilis: Cefazolin is resistant if MIC > or = 8 mcg/mL. (Distinguishing susceptible versus intermediate for isolates with MIC < or = 4 mcg/mL requires additional testing.) For uncomplicated UTI caused by E. coli, K. pneumoniae or P. mirabilis: Cefazolin is susceptible if MIC <32 mcg/mL and predicts susceptible to the oral agents cefaclor, cefdinir, cefpodoxime, cefprozil, cefuroxime, cephalexin and loracarbef.     CEFTAZIDIME <=1 Sensitive     CEFEPIME <=1 Sensitive     CEFTRIAXONE <=1 Sensitive     CIPROFLOXACIN <=0.25 Sensitive     LEVOFLOXACIN <=0.12 Sensitive     GENTAMICIN <=1 Sensitive     IMIPENEM <=0.25 Sensitive     NITROFURANTOIN <=16 Sensitive     PIP/TAZO <=4 Sensitive     TOBRAMYCIN <=1 Sensitive     TRIMETH/SULFA* <=20 Sensitive      * For infections other than uncomplicated UTI caused by E. coli, K. pneumoniae or P. mirabilis: Cefazolin is resistant if MIC > or = 8 mcg/mL. (Distinguishing susceptible versus intermediate for isolates with MIC < or = 4 mcg/mL requires additional testing.) For uncomplicated UTI caused by E. coli, K. pneumoniae or P. mirabilis: Cefazolin is susceptible if MIC <32 mcg/mL and predicts susceptible to the oral agents cefaclor, cefdinir, cefpodoxime, cefprozil, cefuroxime, cephalexin and loracarbef. Legend: S = Susceptible  I = Intermediate R = Resistant  NS = Not susceptible * = Not tested  NR = Not reported **NN = See antimicrobic comments   POC Urinalysis Dipstick     Status: Abnormal   Collection Time:  11/07/21  8:53 AM  Result Value Ref Range   Color, UA light yellow    Clarity, UA cloudy    Glucose, UA Negative Negative   Bilirubin, UA positive    Ketones, UA negative    Spec Grav, UA 1.010 1.010 - 1.025   Blood, UA large    pH, UA 8.0 5.0 - 8.0   Protein, UA Positive (A) Negative   Urobilinogen, UA 0.2 0.2 or 1.0  E.U./dL   Nitrite, UA negative    Leukocytes, UA Large (3+) (A) Negative   Appearance dusty    Odor mild   POC Urinalysis Dipstick     Status: Abnormal   Collection Time: 11/20/21  3:25 PM  Result Value Ref Range   Color, UA pal yellow    Clarity, UA slightly cloudy    Glucose, UA Negative Negative   Bilirubin, UA negative    Ketones, UA negative    Spec Grav, UA 1.010 1.010 - 1.025   Blood, UA negative    pH, UA 6.5 5.0 - 8.0   Protein, UA Negative Negative   Urobilinogen, UA 0.2 0.2 or 1.0 E.U./dL   Nitrite, UA negative    Leukocytes, UA Moderate (2+) (A) Negative   Appearance mild    Odor normal   Culture, Urine     Status: None   Collection Time: 11/20/21  3:34 PM   Specimen: Urine  Result Value Ref Range   MICRO NUMBER: [id number]    SPECIMEN QUALITY: Adequate    Sample Source URINE    STATUS: FINAL    Result: No Growth      Assessment and Plan :   PDMP not reviewed this encounter.  1. Lower abdominal pain   2. Constipation, unspecified constipation type   3. History of UTI    Patient declined urinalysis given her recent work-up that was equivocal and I am in agreement.  She has had difficulty with constipation and has not been able to have a good bowel movement.  Recommended that she trial an enema.  No signs of an acute abdomen on exam.  She does have abdominal tenderness but this is more consistent with her constipation.  We discussed the possibility of obtaining a CT scan through the emergency room but as she has hemodynamically stable vital signs and no signs of an acute abdomen on exam, will hold off on this for now.  I have low suspicion for C. difficile because of her current bowel movements and lack of diarrhea, fevers, nausea, vomiting.  However, I did recommend she follow-up with her PCP as soon as possible for recheck on her symptoms or consideration for outpatient CT scan imaging.  Deferred repeat x-rays today as she just had these done.  Counseled patient on  potential for adverse effects with medications prescribed/recommended today, ER and return-to-clinic precautions discussed, patient verbalized understanding.    Jaynee Eagles, PA-C 11/25/21 [id number]

## 2021-11-25 NOTE — ED Triage Notes (Signed)
Pt here with bilateral lower abdominal pain x 1 week. Pt got a UA recently and it was clear. Pt has a stool sample ordered and is currently being seen by [employer] senior care. Pt states she has some constipation.

## 2021-11-27 ENCOUNTER — Ambulatory Visit
Admission: RE | Admit: 2021-11-27 | Discharge: 2021-11-27 | Disposition: A | Discharge status: Home / Self Care | Payer: Medicare Other | Source: Ambulatory Visit | Attending: Adult Health | Admitting: Adult Health

## 2021-11-27 DIAGNOSIS — R1084 Generalized abdominal pain: Secondary | ICD-10-CM

## 2021-11-28 NOTE — Progress Notes (Signed)
Chest x-ray is negative for pneumonia.

## 2021-12-02 LAB — CLOSTRIDIUM DIFFICILE CULTURE-FECAL

## 2021-12-04 ENCOUNTER — Encounter: Payer: Self-pay | Admitting: Nurse Practitioner

## 2021-12-04 ENCOUNTER — Ambulatory Visit (INDEPENDENT_AMBULATORY_CARE_PROVIDER_SITE_OTHER): Payer: Medicare Other | Admitting: Nurse Practitioner

## 2021-12-04 ENCOUNTER — Other Ambulatory Visit: Payer: Self-pay | Admitting: Nurse Practitioner

## 2021-12-04 VITALS — BP 110/64 | HR 74 | Temp 96.8°F | Resp 20 | Ht 59.0 in | Wt 112.6 lb

## 2021-12-04 DIAGNOSIS — N3281 Overactive bladder: Secondary | ICD-10-CM

## 2021-12-04 DIAGNOSIS — K5904 Chronic idiopathic constipation: Secondary | ICD-10-CM | POA: Diagnosis not present

## 2021-12-04 DIAGNOSIS — R1084 Generalized abdominal pain: Secondary | ICD-10-CM | POA: Diagnosis not present

## 2021-12-04 MED ORDER — LUBIPROSTONE 8 MCG PO CAPS: 8.0000 ug | ORAL_CAPSULE | Freq: Two times a day (BID) | ORAL | 1 refills | Status: DC

## 2021-12-04 NOTE — Patient Instructions (Signed)
Start amitiza daily ONLY GO up to twice daily if not having bowel movements and continues to be constipated

## 2021-12-04 NOTE — Progress Notes (Signed)
C-difficile culture was negarive.

## 2021-12-04 NOTE — Progress Notes (Signed)
Careteam: Patient Care Team: [name], NP as PCP - General (Geriatric Medicine) [name], MD as PCP - Electrophysiology (Cardiology) [name], MD as Consulting Physician (General Surgery) Paralee Cancel, MD as Consulting Physician (Orthopedic Surgery) Marica Otter, [place] (Optometry) Marcial Pacas, MD as Consulting Physician (Neurology) [name], MD as Consulting Physician (Cardiology) Ricard Dillon, MD (Psychiatry)  PLACE OF SERVICE:  [place]  Advanced Directive information    Allergies  Allergen Reactions   Iodine Shortness Of Breath    Iodine contrast, CHF , SOB   Shellfish Allergy Shortness Of Breath   Memantine     Malaise, fogginess/ couldn't think   Aspirin Nausea And Vomiting   Codeine Nausea And Vomiting    Chief Complaint  Patient presents with   Acute Visit    Patient presents today for a urgent care follow-up for abdominal pain. She was prescribed enemas and did it once and it helped out a little.     HPI: Patient is a 88 y.o. female   Presented to urgent care last week for intermittent abdominal pain. Was then not thought to be urinary related as she was recently tx for a UTI with resolution of symptoms. No signs of acute abdomen at that time so CT was deferred. Had a chest x-ray yesterday that was clear. Now having bowel movements daily with a small amount each time and still has an achy random pain that comes and goes, not associated with eating stays about half an hour to hour. Has tried tylenol as a part of her standing regimen but no additional tylenol when she has the pain. Symptoms have been going on for about a month. No blood in stool or urine, stool is not dark in color. Has hx of GERD, on omeprazole, but no recent flare ups.    Denies nausea/ vomiting, fever, chills.  Is eating and drinking ok.   Consistently uses miralax and senokot. Is straining to have a bowel movement and when she has one having it is small.    Per daughter, when at urgent care the pain was so bad that she was in tears, then after she had an enema the greater pain subsided. Now just intermittent aches.   Review of Systems:  Review of Systems  Constitutional:  Negative for fever and weight loss.  Respiratory:  Negative for cough and shortness of breath.   Cardiovascular:  Negative for chest pain.  Gastrointestinal:  Positive for abdominal pain and constipation. Negative for blood in stool, diarrhea, heartburn, melena, nausea and vomiting.  Genitourinary:  Negative for dysuria, flank pain and hematuria.  Musculoskeletal:  Positive for back pain.  Neurological:  Negative for dizziness and headaches.  Psychiatric/Behavioral:  Negative for depression. The patient is not nervous/anxious.     Past Medical History:  Diagnosis Date   Arthritis    Cancer ([place])    left breast cancer    Cataracts, bilateral    removed by surgery   CHF (congestive heart failure) ([place])    PACEMAKER & DEFIB   Complication of anesthesia    hypotensive after back surgery in 2006   Depression    Dyslipidemia    Fainted 04/21/06   AT [facility]   GERD (gastroesophageal reflux disease)    Headache(784.0)    Hearing loss    bilateral hearing aids   HLD (hyperlipidemia)    diet controlled    Hypertension    Hypothyroidism    ICD (implantable cardiac defibrillator)  in place    pt has pacer/icd   ICD (implantable cardiac defibrillator), biventricular, in situ    LBBB (left bundle branch block)    Memory loss    Nonischemic cardiomyopathy ([place])    Normal coronary arteries    s/p cardiac cath 2007   Pacemaker    ICD [employer]   Syncope    Systolic CHF [facility])    Vertigo    Wears glasses    Past Surgical History:  Procedure Laterality Date   BACK SURGERY     lumbar fusion    BREAST LUMPECTOMY Left 2008   BREAST SURGERY  2000   LUMP REMOVAL. STAGE 1 CANCER   CARDIAC CATHETERIZATION     CATARACT EXTRACTION     COLONOSCOPY     EYE  SURGERY     IMPLANTABLE CARDIOVERTER DEFIBRILLATOR GENERATOR CHANGE N/A 12/18/2012   Procedure: IMPLANTABLE CARDIOVERTER DEFIBRILLATOR GENERATOR CHANGE;  Surgeon: [name], MD;  Location: [facility] C[facility] LAB;  Service: Cardiovascular;  Laterality: N/A;   JOINT REPLACEMENT  06/14/01   right   LUMBAR FUSION  2006   MASS EXCISION  11/08/2011   Procedure: EXCISION MASS;  Surgeon: [name], MD;  Location: [facility];  Service: General;  Laterality: Left;  Excision Left Thigh Mass   MASTECTOMY W/ SENTINEL NODE BIOPSY Left 06/04/2019   Procedure: LEFT MASTECTOMY WITH SENTINEL LYMPH NODE BIOPSY;  Surgeon: [name], MD;  Location: [place];  Service: General;  Laterality: Left;   MASTECTOMY, PARTIAL  2008   GOT PACEMAKER AND DEFIB AT THAT TIME   PACEMAKER INSERTION  04/23/06   TOTAL KNEE ARTHROPLASTY  05/17/01   RIGHT KNEE   TOTAL KNEE ARTHROPLASTY Left 11/29/2014   Procedure: TOTAL LEFT KNEE ARTHROPLASTY;  Surgeon: Paralee Cancel, MD;  Location: [facility];  Service: Orthopedics;  Laterality: Left;   Social History:   reports that she has never smoked. She has never been exposed to tobacco smoke. She has never used smokeless tobacco. She reports that she does not drink alcohol and does not use drugs.  Family History  Problem Relation Age of Onset   Hypertension Mother    Arthritis Mother    Hypertension Father    Hypertension Brother    Hypertension Brother     Medications: Patient's Medications  New Prescriptions   LUBIPROSTONE (AMITIZA) 8 MCG CAPSULE    Take 1 capsule (8 mcg total) by mouth 2 (two) times daily with a meal.  Previous Medications   ACETAMINOPHEN (TYLENOL) 500 MG TABLET    Take 500 mg by mouth in the morning and at bedtime. 500 mg in the morning and afternoon   ACETAMINOPHEN (TYLENOL) 650 MG CR TABLET    Take 650 mg by mouth at bedtime.   ALBUTEROL (VENTOLIN HFA) 108 (90 BASE) MCG/ACT INHALER    Inhale 2 puffs into the lungs every 6 (six) hours as needed for wheezing or shortness of  breath.   ASCORBIC ACID (VITAMIN C) 500 MG TABLET    Take 500 mg by mouth daily.   BUPROPION (WELLBUTRIN XL) 150 MG 24 HR TABLET    Take 1 tablet (150 mg total) by mouth daily.   CARVEDILOL (COREG) 3.125 MG TABLET    TAKE 1 TABLET BY MOUTH  TWICE DAILY   CHOLECALCIFEROL (VITAMIN D3) 50 MCG (2000 UT) TABS    Take 2,000 Units by mouth daily.    DICLOFENAC SODIUM (VOLTAREN) 1 % GEL    Apply 4 g topically 4 (four) times daily.  DULOXETINE (CYMBALTA) 60 MG CAPSULE    Take 60 mg by mouth daily.   FAMOTIDINE (PEPCID) 10 MG TABLET    Take 10 mg by mouth as needed for heartburn or indigestion.   FLUTICASONE (FLONASE) 50 MCG/ACT NASAL SPRAY    Place 2 sprays into both nostrils daily.   FUROSEMIDE (LASIX) 20 MG TABLET    Take 1 tablet (20 mg total) by mouth daily as needed.   LEVOTHYROXINE (SYNTHROID, LEVOTHROID) 100 MCG TABLET    Take 1 tablet (100 mcg total) by mouth daily before breakfast.   LIDOCAINE 4 % PTCH    Apply 1 patch topically every 12 (twelve) hours. Apply to back   MAGNESIUM 250 MG TABS    Take 250 mg by mouth daily.   MECLIZINE (ANTIVERT) 12.5 MG TABLET    Take 1 tablet (12.5 mg total) by mouth 3 (three) times daily as needed for dizziness.   MIRABEGRON ER (MYRBETRIQ) 50 MG TB24 TABLET    Take 1 tablet (50 mg total) by mouth daily.   NALTREXONE HCL PO    Take 4 mg by mouth at bedtime.   NONFORMULARY OR COMPOUNDED ITEM    Apply 120 Tubes topically daily. Diclofenac/Cyclobenzaprine/lamotrigine/lidocaine/prilocaine (10480) 2%/2%/6%/5%/1.25% cream QTY: 120 GM SIG: (NEURO) APPLY 1-2 PUMPS (1-2 GMS) TO AFFECTED AREA (S) OR FOCAL POINTS 3 TO 4 TIMES DAILY. RUB IN FOR 2 MINUTES TO ACHIEVE MAX PENETRATION. [place] HANDS WELL.   OMEGA-3 FATTY ACIDS (FISH OIL PO)    Take 1 capsule by mouth daily.   OMEPRAZOLE (PRILOSEC) 40 MG CAPSULE    Take 1 capsule (40 mg total) by mouth daily.   POLYETHYLENE GLYCOL POWDER (GLYCOLAX/MIRALAX) 17 GM/SCOOP POWDER    Take 1 Container by mouth daily.   PREGABALIN  (LYRICA) 25 MG CAPSULE    Take 25 mg by mouth 2 (two) times daily. 1 in the AM and 1 at night for back/nerve pain.   SENNA-DOCUSATE (SENOKOT S) 8.6-50 MG TABLET    Take 2 tablets by mouth daily.   SODIUM PHOSPHATE (FLEET) 7-19 GM/118ML ENEM    Place 133 mLs (1 enema total) rectally daily as needed for severe constipation.   SPIRONOLACTONE (ALDACTONE) 25 MG TABLET    TAKE 1 TABLET BY MOUTH IN  THE MORNING   TELMISARTAN (MICARDIS) 20 MG TABLET    Take 1 tablet (20 mg total) by mouth daily.   TELMISARTAN (MICARDIS) 20 MG TABLET    Take 1 tablet by mouth daily.   VITAMIN E 400 UNITS TABS    Take 400 Units by mouth daily.   Modified Medications   No medications on file  Discontinued Medications   No medications on file    Physical Exam:  Vitals:   12/04/21 0936  BP: 110/64  Pulse: 74  Resp: 20  Temp: (!) 96.8 F (36 C)  SpO2: 97%  Weight: 112 lb 9.6 oz (51.1 kg)  Height: _0  (1.499 m)   Body mass index is 22.74 kg/m. Wt Readings from Last 3 Encounters:  12/04/21 112 lb 9.6 oz (51.1 kg)  11/20/21 111 lb 3.2 oz (50.4 kg)  11/06/21 114 lb (51.7 kg)    Physical Exam Constitutional:      General: She is not in acute distress. Cardiovascular:     Rate and Rhythm: Normal rate and regular rhythm.     Heart sounds: Normal heart sounds.  Pulmonary:     Effort: Pulmonary effort is normal. No respiratory distress.     Breath sounds:  Normal breath sounds. No wheezing.  Abdominal:     General: Abdomen is flat. Bowel sounds are normal. There is no distension.     Palpations: Abdomen is soft.     Tenderness: There is abdominal tenderness. There is no guarding.     Comments: epigastric  Musculoskeletal:        General: Normal range of motion.     Right lower leg: No edema.     Left lower leg: No edema.  Skin:    General: Skin is warm and dry.  Neurological:     Mental Status: She is alert and oriented to person, place, and time. Mental status is at baseline.  Psychiatric:         Mood and Affect: Mood normal.        Behavior: Behavior normal.     Labs reviewed: Basic Metabolic Panel: Recent Labs    06/02/21 0839 06/19/21 1015 09/25/21 1502 10/04/21 1036 10/23/21 1528  NA 138   < > 135 133* 132*  K 4.9   < > 4.6 4.2 4.3  CL 100   < > 99 97* 97*  CO2 32   < > _0 GLUCOSE 103   < > 104 101 116  BUN 28*   < > _1 CREATININE 1.04*   < > 0.72 0.82 0.81  CALCIUM 11.1*   < > 10.7* 10.3 10.9*  TSH 0.81  --   --   --   --    < > = values in this interval not displayed.   Liver Function Tests: Recent Labs    12/21/20 1009 06/02/21 0839 06/20/21 0945 07/07/21 0951  AST _2 ALT _3 ALKPHOS 53  --  60  --   BILITOT 0.3 0.4 0.4 0.4  PROT 7.3 7.2 7.0 7.2  ALBUMIN 3.9  --  4.0  --    No results for input(s): "LIPASE", "AMYLASE" in the last 8760 hours. No results for input(s): "AMMONIA" in the last 8760 hours. CBC: Recent Labs    06/20/21 0945 07/02/21 1034 07/07/21 0951  WBC 6.6 6.7 5.3  NEUTROABS 4.8 4.5 3,344  HGB 13.0 12.1 12.5  HCT 38.2 35.9* 38.1  MCV 86.4 88.6 88.8  PLT 117* 145* 143   Lipid Panel: No results for input(s): "CHOL", "HDL", "LDLCALC", "TRIG", "CHOLHDL", "LDLDIRECT" in the last 8760 hours. TSH: Recent Labs    06/02/21 0839  TSH 0.81   A1C: No results found for: "HGBA1C"   Assessment/Plan 1. Generalized abdominal pain Question if this is constipation related however she is having epigastic pain -no worsening GERD.  -will get labs and Ct for further evaluation  - CT Abdomen Pelvis W Contrast; Future - CMP with eGFR([facility]) - CBC with Differential/Platelet - Amylase - Lipase  2. Chronic idiopathic constipation - continue senokot, miralax, and hydration - will add lubiprostone but to use once daily, if no results can increase to tice.  - lubiprostone (AMITIZA) 8 MCG capsule; Take 1 capsule (8 mcg total) by mouth 2 (two) times daily with a meal.  Dispense: 60 capsule; Refill: 1     Student- Waunita Schooner, RN -I personally was present during the history, physical exam and medical decision-making activities of this service and have verified that the service and findings are accurately documented in the student's note  Keola Heninger K. Aneta, [facility] [phone]

## 2021-12-05 ENCOUNTER — Other Ambulatory Visit: Payer: Self-pay | Admitting: *Deleted

## 2021-12-05 LAB — CBC WITH DIFFERENTIAL/PLATELET
Absolute Monocytes: 567 cells/uL (ref 200–950)
Basophils Absolute: 90 cells/uL (ref 0–200)
Basophils Relative: 1.7 %
Eosinophils Absolute: 148 cells/uL (ref 15–500)
Eosinophils Relative: 2.8 %
HCT: 36.4 % (ref 35.0–45.0)
Hemoglobin: 12 g/dL (ref 11.7–15.5)
Lymphs Abs: 1028 cells/uL (ref 850–3900)
MCH: 29.7 pg (ref 27.0–33.0)
MCHC: 33 g/dL (ref 32.0–36.0)
MCV: 90.1 fL (ref 80.0–100.0)
MPV: 11.4 fL (ref 7.5–12.5)
Monocytes Relative: 10.7 %
Neutro Abs: 3466 cells/uL (ref 1500–7800)
Neutrophils Relative %: 65.4 %
Platelets: 144 10*3/uL (ref 140–400)
RBC: 4.04 10*6/uL (ref 3.80–5.10)
RDW: 13.5 % (ref 11.0–15.0)
Total Lymphocyte: 19.4 %
WBC: 5.3 10*3/uL (ref 3.8–10.8)

## 2021-12-05 LAB — COMPLETE METABOLIC PANEL WITH GFR
AG Ratio: 1.2 (calc) (ref 1.0–2.5)
ALT: 20 U/L (ref 6–29)
AST: 22 U/L (ref 10–35)
Albumin: 3.8 g/dL (ref 3.6–5.1)
Alkaline phosphatase (APISO): 75 U/L (ref 37–153)
BUN: 19 mg/dL (ref 7–25)
CO2: 27 mmol/L (ref 20–32)
Calcium: 10.3 mg/dL (ref 8.6–10.4)
Chloride: 99 mmol/L (ref 98–110)
Creat: 0.77 mg/dL (ref 0.60–0.95)
Globulin: 3.1 g/dL (calc) (ref 1.9–3.7)
Glucose, Bld: 57 mg/dL — ABNORMAL LOW (ref 65–139)
Potassium: 4.7 mmol/L (ref 3.5–5.3)
Sodium: 134 mmol/L — ABNORMAL LOW (ref 135–146)
Total Bilirubin: 0.3 mg/dL (ref 0.2–1.2)
Total Protein: 6.9 g/dL (ref 6.1–8.1)
eGFR: 76 mL/min/{1.73_m2} (ref >=60)

## 2021-12-05 LAB — AMYLASE: Amylase: 34 U/L (ref 21–101)

## 2021-12-05 LAB — LIPASE: Lipase: 14 U/L (ref 7–60)

## 2021-12-05 MED ORDER — NONFORMULARY OR COMPOUNDED ITEM: 120.0000 | Freq: Every day | 2 refills | Status: DC

## 2021-12-05 NOTE — Telephone Encounter (Signed)
Patient called requesting a refill of her Compounded Cream for her knees.   Printed Rx and placed in [place] folder to review and sign. To be faxed to [facility] once signed Fax: [phone]

## 2021-12-05 NOTE — Telephone Encounter (Signed)
Patient has request refill on medication Myrbetriq '50mg'$ . Patient last refill dated 10/16/2021 with 30 tablets and 0 refills. Is this medication ongoing? I'm unsure. Medication pend and sent to PCP Dewaine Oats Carlos American, NP for approval for further refills.

## 2021-12-07 MED ORDER — NONFORMULARY OR COMPOUNDED ITEM: 120.0000 | Freq: Every day | 2 refills | Status: DC

## 2021-12-07 NOTE — Telephone Encounter (Signed)
This encounter was created in error - please disregard.

## 2021-12-08 ENCOUNTER — Encounter: Payer: Self-pay | Admitting: Nurse Practitioner

## 2021-12-08 DIAGNOSIS — R1084 Generalized abdominal pain: Secondary | ICD-10-CM

## 2021-12-08 NOTE — Telephone Encounter (Signed)
Rx signed and faxed.

## 2021-12-12 ENCOUNTER — Other Ambulatory Visit: Payer: Self-pay | Admitting: Nurse Practitioner

## 2021-12-13 ENCOUNTER — Other Ambulatory Visit: Payer: Self-pay

## 2021-12-13 DIAGNOSIS — I10 Essential (primary) hypertension: Secondary | ICD-10-CM

## 2021-12-13 MED ORDER — TELMISARTAN 20 MG PO TABS: 20.0000 mg | ORAL_TABLET | Freq: Every day | ORAL | 0 refills | Status: DC

## 2021-12-13 NOTE — Telephone Encounter (Signed)
Patient called requesting refill on medication. High warnings came up when trying to fill medication.  Medication pended and sent to Sherrie Mustache, NP

## 2021-12-20 ENCOUNTER — Other Ambulatory Visit: Payer: Self-pay

## 2021-12-20 DIAGNOSIS — C50912 Malignant neoplasm of unspecified site of left female breast: Secondary | ICD-10-CM

## 2021-12-21 ENCOUNTER — Ambulatory Visit: Payer: Medicare Other | Admitting: Hematology

## 2021-12-21 ENCOUNTER — Other Ambulatory Visit: Payer: Medicare Other

## 2021-12-21 ENCOUNTER — Telehealth: Payer: Self-pay | Admitting: Radiology

## 2021-12-21 NOTE — Telephone Encounter (Signed)
Premedication regimen for contrast allergy called in to pt's pharmacy [facility] [phone]); CT scheduled for 12/27/21 at 0830

## 2021-12-22 ENCOUNTER — Inpatient Hospital Stay: Payer: Medicare Other | Attending: Hematology

## 2021-12-22 ENCOUNTER — Inpatient Hospital Stay (HOSPITAL_BASED_OUTPATIENT_CLINIC_OR_DEPARTMENT_OTHER): Payer: Medicare Other | Admitting: Hematology

## 2021-12-22 ENCOUNTER — Other Ambulatory Visit: Payer: Self-pay

## 2021-12-22 ENCOUNTER — Other Ambulatory Visit: Payer: Self-pay | Admitting: Physical Medicine and Rehabilitation

## 2021-12-22 VITALS — BP 116/64 | HR 74 | Temp 97.5°F | Resp 15 | Wt 113.3 lb

## 2021-12-22 DIAGNOSIS — C50912 Malignant neoplasm of unspecified site of left female breast: Secondary | ICD-10-CM | POA: Insufficient documentation

## 2021-12-22 DIAGNOSIS — Z171 Estrogen receptor negative status [ER-]: Secondary | ICD-10-CM | POA: Diagnosis not present

## 2021-12-22 DIAGNOSIS — Z9012 Acquired absence of left breast and nipple: Secondary | ICD-10-CM | POA: Insufficient documentation

## 2021-12-22 DIAGNOSIS — I890 Lymphedema, not elsewhere classified: Secondary | ICD-10-CM | POA: Insufficient documentation

## 2021-12-22 LAB — CBC WITH DIFFERENTIAL (CANCER CENTER ONLY)
Abs Immature Granulocytes: 0.03 10*3/uL (ref 0.00–0.07)
Basophils Absolute: 0.1 10*3/uL (ref 0.0–0.1)
Basophils Relative: 2 %
Eosinophils Absolute: 0.2 10*3/uL (ref 0.0–0.5)
Eosinophils Relative: 3 %
HCT: 35.2 % — ABNORMAL LOW (ref 36.0–46.0)
Hemoglobin: 11.8 g/dL — ABNORMAL LOW (ref 12.0–15.0)
Immature Granulocytes: 1 %
Lymphocytes Relative: 21 %
Lymphs Abs: 1.2 10*3/uL (ref 0.7–4.0)
MCH: 29.7 pg (ref 26.0–34.0)
MCHC: 33.5 g/dL (ref 30.0–36.0)
MCV: 88.7 fL (ref 80.0–100.0)
Monocytes Absolute: 0.6 10*3/uL (ref 0.1–1.0)
Monocytes Relative: 10 %
Neutro Abs: 3.7 10*3/uL (ref 1.7–7.7)
Neutrophils Relative %: 63 %
Platelet Count: 151 10*3/uL (ref 150–400)
RBC: 3.97 MIL/uL (ref 3.87–5.11)
RDW: 16 % — ABNORMAL HIGH (ref 11.5–15.5)
WBC Count: 5.9 10*3/uL (ref 4.0–10.5)
nRBC: 0 % (ref 0.0–0.2)

## 2021-12-22 LAB — CMP (CANCER CENTER ONLY)
ALT: 23 U/L (ref 0–44)
AST: 29 U/L (ref 15–41)
Albumin: 3.6 g/dL (ref 3.5–5.0)
Alkaline Phosphatase: 69 U/L (ref 38–126)
Anion gap: 6 (ref 5–15)
BUN: 19 mg/dL (ref 8–23)
CO2: 28 mmol/L (ref 22–32)
Calcium: 10.1 mg/dL (ref 8.9–10.3)
Chloride: 101 mmol/L (ref 98–111)
Creatinine: 0.73 mg/dL (ref 0.44–1.00)
GFR, Estimated: >60 mL/min (ref >60)
Glucose, Bld: 73 mg/dL (ref 70–99)
Potassium: 4.7 mmol/L (ref 3.5–5.1)
Sodium: 135 mmol/L (ref 135–145)
Total Bilirubin: 0.4 mg/dL (ref 0.3–1.2)
Total Protein: 6.5 g/dL (ref 6.5–8.1)

## 2021-12-25 ENCOUNTER — Ambulatory Visit (INDEPENDENT_AMBULATORY_CARE_PROVIDER_SITE_OTHER): Payer: Medicare Other | Admitting: Urology

## 2021-12-25 VITALS — BP 143/69 | HR 71 | Ht <= 58 in | Wt 112.0 lb

## 2021-12-25 DIAGNOSIS — N3946 Mixed incontinence: Secondary | ICD-10-CM

## 2021-12-25 DIAGNOSIS — N3281 Overactive bladder: Secondary | ICD-10-CM | POA: Diagnosis not present

## 2021-12-25 MED ORDER — MIRABEGRON ER 50 MG PO TB24: 50.0000 mg | ORAL_TABLET | Freq: Every day | ORAL | 11 refills | Status: DC

## 2021-12-25 NOTE — Progress Notes (Signed)
12/25/2021 10:39 AM   [name] 06-29-1933 [id number]  Referring provider: [name], NP [place],  [place] [address]  Chief Complaint  Patient presents with   Over Active Bladder    HPI: [name]/SN: Overactive bladder on Myrbetriq.  Follow-up for renal cyst.  Was given Gemtesa last visit.  It was suggested to be fitted with a larger pessary.  Has medical comorbidities including dementia and previous stroke   The patient is going to see her gynecologist and as previously noted seek perhaps a larger one.   She has not had a hysterectomy  Currently she sometimes has urge incontinence if her bladder is full.  No leaking with coughing sneezing.  No bedwetting.  Wears 1 pad a day.   Patient has mild urge incontinence.  She thinks [name] works a bit better than [employer].  Samples and prescription given.  Reassess 4 months.  I felt that a pessary is the best way to manage prolapse.  I hysterectomy with prolapse repair would obviously be less ideal in an elderly patient with comorbidities.  Her and her daughter agreed.   Today [name] is not working that well.  Still wears pads and changes tissue within a pad system.  Clinically not infected but I sent a urine for culture.  Frequency stable  The role of antimuscarinics with her above history of dementia discussed.  Role of percutaneous tibial nerve stimulation discussed.  Family does not want to try an antimuscarinic.  Unfortunately she has a pacemaker and we discussed this.  She want to try Myrbetriq.  I gave her much of samples.  When she comes back in 6 or 7 weeks I will mention Botox recognizing she may not be ideal.  In my opinion she is not a good candidate for InterStim  Today Frequency stable.  Last culture negative Patient is little bit better on Myrbetriq.  Samples and prescription given.  Discussed Botox in detail.  Handout given recognizing may not be ideal.  Were not going to do InterStim.   She is having good days and bad days on Myrbetriq I think overall it helps some.       PMH: Past Medical History:  Diagnosis Date   Arthritis    Cancer ([place])    left breast cancer    Cataracts, bilateral    removed by surgery   CHF (congestive heart failure) ([place])    PACEMAKER & DEFIB   Complication of anesthesia    hypotensive after back surgery in 2006   Depression    Dyslipidemia    Fainted 04/21/06   AT [facility]   GERD (gastroesophageal reflux disease)    Headache(784.0)    Hearing loss    bilateral hearing aids   HLD (hyperlipidemia)    diet controlled    Hypertension    Hypothyroidism    ICD (implantable cardiac defibrillator) in place    pt has pacer/icd   ICD (implantable cardiac defibrillator), biventricular, in situ    LBBB (left bundle branch block)    Memory loss    Nonischemic cardiomyopathy ([place])    Normal coronary arteries    s/p cardiac cath 2007   Pacemaker    ICD [employer]   Syncope    Systolic CHF [facility])    Vertigo    Wears glasses     Surgical History: Past Surgical History:  Procedure Laterality Date   BACK SURGERY     lumbar fusion    BREAST  LUMPECTOMY Left 2008   BREAST SURGERY  2000   LUMP REMOVAL. STAGE 1 CANCER   CARDIAC CATHETERIZATION     CATARACT EXTRACTION     COLONOSCOPY     EYE SURGERY     IMPLANTABLE CARDIOVERTER DEFIBRILLATOR GENERATOR CHANGE N/A 12/18/2012   Procedure: IMPLANTABLE CARDIOVERTER DEFIBRILLATOR GENERATOR CHANGE;  Surgeon: [name], MD;  Location: [facility] CATH LAB;  Service: Cardiovascular;  Laterality: N/A;   JOINT REPLACEMENT  06/14/01   right   LUMBAR FUSION  2006   MASS EXCISION  11/08/2011   Procedure: EXCISION MASS;  Surgeon: [name], MD;  [facility];  Service: General;  Laterality: Left;  Excision Left Thigh Mass   MASTECTOMY W/ SENTINEL NODE BIOPSY Left 06/04/2019   Procedure: LEFT MASTECTOMY WITH SENTINEL LYMPH NODE BIOPSY;  Surgeon: [name], MD;  Location: [place];  Service:  General;  Laterality: Left;   MASTECTOMY, PARTIAL  2008   GOT PACEMAKER AND DEFIB AT THAT TIME   PACEMAKER INSERTION  04/23/06   TOTAL KNEE ARTHROPLASTY  05/17/01   RIGHT KNEE   TOTAL KNEE ARTHROPLASTY Left 11/29/2014   Procedure: TOTAL LEFT KNEE ARTHROPLASTY;  Surgeon: Paralee Cancel, MD;  [facility];  Service: Orthopedics;  Laterality: Left;    Home Medications:  Allergies as of 12/25/2021       Reactions   Iodine Shortness Of Breath   Iodine contrast, CHF , SOB   Shellfish Allergy Shortness Of Breath   Memantine    Malaise, fogginess/ couldn't think   Aspirin Nausea And Vomiting   Codeine Nausea And Vomiting        Medication List        Accurate as of December 25, 2021 10:39 AM. If you have any questions, ask your nurse or doctor.          acetaminophen 650 MG CR tablet Commonly known as: TYLENOL Take 650 mg by mouth at bedtime.   acetaminophen 500 MG tablet Commonly known as: TYLENOL Take 500 mg by mouth in the morning and at bedtime. 500 mg in the morning and afternoon   albuterol 108 (90 Base) MCG/ACT inhaler Commonly known as: VENTOLIN HFA Inhale 2 puffs into the lungs every 6 (six) hours as needed for wheezing or shortness of breath.   ascorbic acid 500 MG tablet Commonly known as: VITAMIN C Take 500 mg by mouth daily.   buPROPion 150 MG 24 hr tablet Commonly known as: WELLBUTRIN XL Take 1 tablet (150 mg total) by mouth daily.   carvedilol 3.125 MG tablet Commonly known as: COREG TAKE 1 TABLET BY MOUTH  TWICE DAILY   diclofenac Sodium 1 % Gel Commonly known as: VOLTAREN Apply 4 g topically 4 (four) times daily.   DULoxetine 60 MG capsule Commonly known as: CYMBALTA Take 60 mg by mouth daily.   famotidine 10 MG tablet Commonly known as: PEPCID Take 10 mg by mouth as needed for heartburn or indigestion.   FISH OIL PO Take 1 capsule by mouth daily.   fluticasone 50 MCG/ACT nasal spray Commonly known as: FLONASE Place 2 sprays into both  nostrils daily.   furosemide 20 MG tablet Commonly known as: LASIX Take 1 tablet (20 mg total) by mouth daily as needed.   levothyroxine 100 MCG tablet Commonly known as: SYNTHROID Take 1 tablet (100 mcg total) by mouth daily before breakfast.   lidocaine 4 % Apply 1 patch topically every 12 (twelve) hours. Apply to back   lubiprostone 8 MCG capsule  Commonly known as: Amitiza Take 1 capsule (8 mcg total) by mouth 2 (two) times daily with a meal.   Magnesium 250 MG Tabs Take 250 mg by mouth daily.   meclizine 12.5 MG tablet Commonly known as: ANTIVERT Take 1 tablet (12.5 mg total) by mouth 3 (three) times daily as needed for dizziness.   Myrbetriq 50 MG Tb24 tablet Generic drug: mirabegron ER TAKE ONE TABLET BY MOUTH DAILY   NALTREXONE HCL PO Take 4 mg by mouth at bedtime.   NONFORMULARY OR COMPOUNDED ITEM Apply 120 Tubes topically daily. Diclofenac/Cyclobenzaprine/lamotrigine/lidocaine/prilocaine (10480) 2%/2%/6%/5%/1.25% cream QTY: 120 GM SIG: (NEURO) APPLY 1-2 PUMPS (1-2 GMS) TO AFFECTED AREA (S) OR FOCAL POINTS 3 TO 4 TIMES DAILY. RUB IN FOR 2 MINUTES TO ACHIEVE MAX PENETRATION. [place] HANDS WELL.   omeprazole 40 MG capsule Commonly known as: PRILOSEC TAKE 1 CAPSULE BY MOUTH DAILY   polyethylene glycol powder 17 GM/SCOOP powder Commonly known as: GLYCOLAX/MIRALAX Take 1 Container by mouth daily.   pregabalin 25 MG capsule Commonly known as: LYRICA Take 25 mg by mouth 2 (two) times daily. 1 in the AM and 1 at night for back/nerve pain.   senna-docusate 8.6-50 MG tablet Commonly known as: Senokot S Take 2 tablets by mouth daily.   sodium phosphate 7-19 GM/118ML Enem Place 133 mLs (1 enema total) rectally daily as needed for severe constipation.   spironolactone 25 MG tablet Commonly known as: ALDACTONE TAKE 1 TABLET BY MOUTH IN  THE MORNING   telmisartan 20 MG tablet Commonly known as: MICARDIS Take 1 tablet (20 mg total) by mouth daily.   Vitamin D3 50  MCG (2000 UT) Tabs Take 2,000 Units by mouth daily.   Vitamin E 400 units Tabs Take 400 Units by mouth daily.        Allergies:  Allergies  Allergen Reactions   Iodine Shortness Of Breath    Iodine contrast, CHF , SOB   Shellfish Allergy Shortness Of Breath   Memantine     Malaise, fogginess/ couldn't think   Aspirin Nausea And Vomiting   Codeine Nausea And Vomiting    Family History: Family History  Problem Relation Age of Onset   Hypertension Mother    Arthritis Mother    Hypertension Father    Hypertension Brother    Hypertension Brother     Social History:  reports that she has never smoked. She has never been exposed to tobacco smoke. She has never used smokeless tobacco. She reports that she does not drink alcohol and does not use drugs.  ROS:                                        Physical Exam: BP (!) 143/69   Pulse 71   Ht '4\' 10"'$  (1.473 m)   Wt 50.8 kg   LMP  (LMP Unknown)   BMI 23.41 kg/m   Constitutional:  Alert and oriented, No acute distress. HEENT: [place] AT, moist mucus membranes.  Trachea midline, no masses.   Laboratory Data: Lab Results  Component Value Date   WBC 5.9 12/22/2021   HGB 11.8 (L) 12/22/2021   HCT 35.2 (L) 12/22/2021   MCV 88.7 12/22/2021   PLT 151 12/22/2021    Lab Results  Component Value Date   CREATININE 0.73 12/22/2021    No results found for: "PSA"  No results found for: "TESTOSTERONE"  No results found  for: "HGBA1C"  Urinalysis    Component Value Date/Time   COLORURINE YELLOW 09/18/2020 1220   APPEARANCEUR Clear 10/16/2021 1029   LABSPEC 1.016 09/18/2020 1220   PHURINE 7.0 09/18/2020 1220   GLUCOSEU Negative 10/16/2021 1029   HGBUR NEGATIVE 09/18/2020 1220   BILIRUBINUR negative 11/20/2021 1525   BILIRUBINUR Negative 10/16/2021 1029   KETONESUR NEGATIVE 09/18/2020 1220   PROTEINUR Negative 11/20/2021 1525   PROTEINUR Negative 10/16/2021 1029   PROTEINUR NEGATIVE 09/18/2020  1220   UROBILINOGEN 0.2 11/20/2021 1525   UROBILINOGEN 0.2 11/22/2014 1241   NITRITE negative 11/20/2021 1525   NITRITE Negative 10/16/2021 1029   NITRITE NEGATIVE 09/18/2020 1220   LEUKOCYTESUR Moderate (2+) (A) 11/20/2021 1525   LEUKOCYTESUR 2+ (A) 10/16/2021 1029   LEUKOCYTESUR NEGATIVE 09/18/2020 1220    Pertinent Imaging:   Assessment & Plan: Myrbetriq samples and prescription given and renewed.  Handout on Botox given.  I will see in a year.  Samples given.  There are no diagnoses linked to this encounter.  No follow-ups on file.  Reece Packer, MD  [facility] [address] [place], [place] [address] [phone]

## 2021-12-25 NOTE — Telephone Encounter (Signed)
Can you please send in refill in Dr. Florentina Jenny absence? Per PMP, last fill was 10/30/21.

## 2021-12-27 ENCOUNTER — Encounter (HOSPITAL_COMMUNITY): Payer: Self-pay

## 2021-12-27 ENCOUNTER — Ambulatory Visit (HOSPITAL_COMMUNITY)
Admission: RE | Admit: 2021-12-27 | Discharge: 2021-12-27 | Disposition: A | Discharge status: Home / Self Care | Payer: Medicare Other | Source: Ambulatory Visit | Attending: Nurse Practitioner | Admitting: Nurse Practitioner

## 2021-12-27 ENCOUNTER — Other Ambulatory Visit (HOSPITAL_COMMUNITY): Payer: Medicare Other

## 2021-12-27 DIAGNOSIS — R1084 Generalized abdominal pain: Secondary | ICD-10-CM | POA: Diagnosis present

## 2021-12-27 MED ORDER — IOHEXOL 300 MG/ML  SOLN
100.0000 mL | Freq: Once | INTRAMUSCULAR | Status: AC | PRN
  Administered 2021-12-27: 100 mL via INTRAVENOUS

## 2021-12-27 MED ORDER — SODIUM CHLORIDE (PF) 0.9 % IJ SOLN
INTRAMUSCULAR | Status: AC
  Filled 2021-12-27: qty 50

## 2021-12-27 NOTE — Telephone Encounter (Signed)
Reviewed PDMP, reniewed

## 2021-12-29 ENCOUNTER — Other Ambulatory Visit: Payer: Medicare Other

## 2021-12-29 NOTE — Progress Notes (Signed)
HEMATOLOGY/ONCOLOGY CLINIC NOTE  Date of Service 12/22/2021   Patient Care Team: [name], NP as PCP - General (Geriatric Medicine) [name], MD as PCP - Electrophysiology (Cardiology) [name], MD as Consulting Physician (General Surgery) Paralee Cancel, MD as Consulting Physician (Orthopedic Surgery) Marica Otter, [place] (Optometry) Marcial Pacas, MD as Consulting Physician (Neurology) [name], MD as Consulting Physician (Cardiology) Ricard Dillon, MD (Psychiatry)  CHIEF COMPLAINTS/PURPOSE OF CONSULTATION:   Follow-up for breast cancer  HISTORY OF PRESENTING ILLNESS:   [name] is a wonderful 88 y.o. female who has been referred to [place] by Sherrie Mustache, NP for evaluation and management of easily bruising. The pt reports that she is doing well overall.   The pt reports that she has seen bruises on her arm, and denies bumping into things or any trauma. The pt notes that her bruises are solely located to her upper extremities. She denies concerns for bleeding in her joints, blood in the stools, blood in the urine, nose bleeds or gum bleeds. She notes she has bruised easily for about 2 years. She also notes that she began taking Zoloft about two years ago, and fish oil 4-5 years ago. She is not on any blood thinners. She denies taking any other new medications in the last couple years. The pt denies any thick bruises at any time. The pt denies excessive bleeding with pervious surgeries and dental extractions. The pt denies heavy periods when she was younger.  She has had two knee replacements and took Tramadol for her pain. She denies needing to use this frequently, and takes Tylenol for mild pains.    The pt takes Vitamin E oil for hot flashes, and notes that her Vitamin E oil successfully resolved her hot flashes. She began taking Vitamin E about 18 months ago.   The pt notes that she has lost about 20 pounds over two years. The pt  reports some constipation and denies difficulty swallowing, and weak appetite. The pt denies any dietary restrictions. She continues annual mammograms. She has a history of left sided breast cancer treated with a lumpectomy and radiation.  She notes that her energy levels have also decreased in the last 6 months, and "feels tired all the time." She endorses feeling well rested after taking naps. She notes that she does feel depressed but that taking Zoloft keeps her "afloat." She notes that she feels "medium" enjoyment in her activities. She sees psychiatry every 6 months.  The pt lives with her husband.   Most recent lab results (02/05/18) of CBC w/diff and CMP is as follows: all values are WNL except for PLT at 117k, BUN at 30, Creatinine at 0.96.  On review of systems, pt reports some stable depression, easily bruising on upper extremities, some weight loss, and denies nose bleeds, gum bleeds, blood in the urine, blood in the stools, abdominal pains, leg swelling, and any other symptoms.   On Family Hx the pt denies bleeding disorders.   INTERVAL HISTORY:   [name] is here for continued valuation and management of her stage I 1B high-grade triple negative breast cancer.  She notes no new breast symptoms or new lumps or bumps. Last mammogram was in February 2023 and she will be due for her next one in February 2024. No indication for additional work-up at this time.     is a wonderful 88 y.o. female who is here for evaluation and management of Stage 1B  high-grade triple neg breast cancer. The patient's last visit with [place] was on 12/21/2020. The pt reports that she is doing well overall. We are joined today by her daughter.  The pt had a mammogram on 05/30/2021 that revealed no evidence of malignancy.  Patient notes no new breast symptoms or new breast lumps that she has noticed.no nipple discharge.  Lab results today 06/20/2021 of CBC w/diff WNL , CMP unremarkable except for  mild hypercalcemia of 11.1 which is consistent with her previous CMP.   She reports having a dry throat and some minor coughing.  She reports no new issues with lymphedema.She remains compliant with recommended stretches and massaging.   On review of systems, pt reports no other acute new symptoms.  MEDICAL HISTORY:  Past Medical History:  Diagnosis Date   Arthritis    Cancer ([place])    left breast cancer    Cataracts, bilateral    removed by surgery   CHF (congestive heart failure) ([place])    PACEMAKER & DEFIB   Complication of anesthesia    hypotensive after back surgery in 2006   Depression    Dyslipidemia    Fainted 04/21/06   AT [facility]   GERD (gastroesophageal reflux disease)    Headache(784.0)    Hearing loss    bilateral hearing aids   HLD (hyperlipidemia)    diet controlled    Hypertension    Hypothyroidism    ICD (implantable cardiac defibrillator) in place    pt has pacer/icd   ICD (implantable cardiac defibrillator), biventricular, in situ    LBBB (left bundle branch block)    Memory loss    Nonischemic cardiomyopathy ([place])    Normal coronary arteries    s/p cardiac cath 2007   Pacemaker    ICD [employer]   Syncope    Systolic CHF [facility])    Vertigo    Wears glasses     SURGICAL HISTORY: Past Surgical History:  Procedure Laterality Date   BACK SURGERY     lumbar fusion    BREAST LUMPECTOMY Left 2008   BREAST SURGERY  2000   LUMP REMOVAL. STAGE 1 CANCER   CARDIAC CATHETERIZATION     CATARACT EXTRACTION     COLONOSCOPY     EYE SURGERY     IMPLANTABLE CARDIOVERTER DEFIBRILLATOR GENERATOR CHANGE N/A 12/18/2012   Procedure: IMPLANTABLE CARDIOVERTER DEFIBRILLATOR GENERATOR CHANGE;  Surgeon: [name], MD;  Location: [facility] CATH LAB;  Service: Cardiovascular;  Laterality: N/A;   JOINT REPLACEMENT  06/14/01   right   LUMBAR FUSION  2006   MASS EXCISION  11/08/2011   Procedure: EXCISION MASS;  Surgeon: [name], MD;  Location: [facility] ORS;  Service:  General;  Laterality: Left;  Excision Left Thigh Mass   MASTECTOMY W/ SENTINEL NODE BIOPSY Left 06/04/2019   Procedure: LEFT MASTECTOMY WITH SENTINEL LYMPH NODE BIOPSY;  Surgeon: [name], MD;  Location: [facility];  Service: General;  Laterality: Left;   MASTECTOMY, PARTIAL  2008   GOT PACEMAKER AND DEFIB AT THAT TIME   PACEMAKER INSERTION  04/23/06   TOTAL KNEE ARTHROPLASTY  05/17/01   RIGHT KNEE   TOTAL KNEE ARTHROPLASTY Left 11/29/2014   Procedure: TOTAL LEFT KNEE ARTHROPLASTY;  Surgeon: Paralee Cancel, MD;  Location: [facility] ORS;  Service: Orthopedics;  Laterality: Left;    SOCIAL HISTORY: Social History   Socioeconomic History   Marital status: Married    Spouse name: Not on file   Number of children: 1  Years of education: Masters   Highest education level: Not on file  Occupational History   Occupation: Retired  Tobacco Use   Smoking status: Never    Passive exposure: Never   Smokeless tobacco: Never  Vaping Use   Vaping Use: Never used  Substance and Sexual Activity   Alcohol use: No   Drug use: No   Sexual activity: Not Currently    Birth control/protection: Post-menopausal  Other Topics Concern   Not on file  Social History Narrative   Lives at home with husband.   Right-handed.      As of 07/28/2014   Diet: No special diet   Caffeine: yes, Chocolate, tea and sodas    Married: YES, 1970   House: Yes, 2 stories, 2-3 persons live in home   Pets: No   Current/Past profession: Engineer, mining, Designer, jewellery    Exercise: Yes 2-3 x weekly   Living Will: Yes   DNR: No   POA/HPOA: No      Social Determinants of Health   Financial Resource Strain: Low Risk  (03/27/2017)   Overall Financial Resource Strain (CARDIA)    Difficulty of Paying Living Expenses: Not hard at all  Food Insecurity: No Food Insecurity (03/27/2017)   Hunger Vital Sign    Worried About Running Out of Food in the Last Year: Never true    [place] in the Last Year: Never true   Transportation Needs: No Transportation Needs (03/27/2017)   PRAPARE - Hydrologist (Medical): No    Lack of Transportation (Non-Medical): No  Physical Activity: Insufficiently Active (03/27/2017)   Exercise Vital Sign    Days of Exercise per Week: 4 days    Minutes of Exercise per Session: 30 min  Stress: Stress Concern Present (03/27/2017)   [place]    Feeling of Stress : To some extent  Social Connections: Socially Integrated (03/27/2017)   Social Connection and Isolation Panel [NHANES]    Frequency of Communication with Friends and Family: More than three times a week    Frequency of Social Gatherings with Friends and Family: Once a week    Attends Religious Services: More than 4 times per year    Active Member of [employer] or Organizations: Yes    Attends Archivist Meetings: Never    Marital Status: Married  Human resources officer Violence: Not At Risk (03/27/2017)   Humiliation, Afraid, Rape, and Kick questionnaire    Fear of Current or Ex-Partner: No    Emotionally Abused: No    Physically Abused: No    Sexually Abused: No    FAMILY HISTORY: Family History  Problem Relation Age of Onset   Hypertension Mother    Arthritis Mother    Hypertension Father    Hypertension Brother    Hypertension Brother     ALLERGIES:  is allergic to iodine, shellfish allergy, memantine, aspirin, and codeine.  MEDICATIONS:  Current Outpatient Medications  Medication Sig Dispense Refill   acetaminophen (TYLENOL) 500 MG tablet Take 500 mg by mouth in the morning and at bedtime. 500 mg in the morning and afternoon     acetaminophen (TYLENOL) 650 MG CR tablet Take 650 mg by mouth at bedtime.     albuterol (VENTOLIN HFA) 108 (90 Base) MCG/ACT inhaler Inhale 2 puffs into the lungs every 6 (six) hours as needed for wheezing or shortness of breath. 8 g 6   ascorbic acid (  VITAMIN C) 500 MG tablet  Take 500 mg by mouth daily.     buPROPion (WELLBUTRIN XL) 150 MG 24 hr tablet Take 1 tablet (150 mg total) by mouth daily.     carvedilol (COREG) 3.125 MG tablet TAKE 1 TABLET BY MOUTH  TWICE DAILY 180 tablet 3   Cholecalciferol (VITAMIN D3) 50 MCG (2000 UT) TABS Take 2,000 Units by mouth daily.      diclofenac Sodium (VOLTAREN) 1 % GEL Apply 4 g topically 4 (four) times daily. 100 g 2   DULoxetine (CYMBALTA) 60 MG capsule Take 60 mg by mouth daily.     famotidine (PEPCID) 10 MG tablet Take 10 mg by mouth as needed for heartburn or indigestion.     fluticasone (FLONASE) 50 MCG/ACT nasal spray Place 2 sprays into both nostrils daily. 16 g 3   furosemide (LASIX) 20 MG tablet Take 1 tablet (20 mg total) by mouth daily as needed. 90 tablet 3   levothyroxine (SYNTHROID, LEVOTHROID) 100 MCG tablet Take 1 tablet (100 mcg total) by mouth daily before breakfast. 90 tablet 3   Lidocaine 4 % PTCH Apply 1 patch topically every 12 (twelve) hours. Apply to back     lubiprostone (AMITIZA) 8 MCG capsule Take 1 capsule (8 mcg total) by mouth 2 (two) times daily with a meal. 60 capsule 1   Magnesium 250 MG TABS Take 250 mg by mouth daily.     meclizine (ANTIVERT) 12.5 MG tablet Take 1 tablet (12.5 mg total) by mouth 3 (three) times daily as needed for dizziness. 30 tablet 1   mirabegron ER (MYRBETRIQ) 50 MG TB24 tablet Take 1 tablet (50 mg total) by mouth daily. 30 tablet 11   NALTREXONE HCL PO Take 4 mg by mouth at bedtime.     NONFORMULARY OR COMPOUNDED ITEM Apply 120 Tubes topically daily. Diclofenac/Cyclobenzaprine/lamotrigine/lidocaine/prilocaine (10480) 2%/2%/6%/5%/1.25% cream QTY: 120 GM SIG: (NEURO) APPLY 1-2 PUMPS (1-2 GMS) TO AFFECTED AREA (S) OR FOCAL POINTS 3 TO 4 TIMES DAILY. RUB IN FOR 2 MINUTES TO ACHIEVE MAX PENETRATION. [place] HANDS WELL. 1 each 2   Omega-3 Fatty Acids (FISH OIL PO) Take 1 capsule by mouth daily.     omeprazole (PRILOSEC) 40 MG capsule TAKE 1 CAPSULE BY MOUTH DAILY 90 capsule 3    polyethylene glycol powder (GLYCOLAX/MIRALAX) 17 GM/SCOOP powder Take 1 Container by mouth daily.     pregabalin (LYRICA) 25 MG capsule Take 1 capsule (25 mg total) by mouth 3 (three) times daily. 1 in AM and 2 at night- for back/nerve pain 90 capsule 5   senna-docusate (SENOKOT S) 8.6-50 MG tablet Take 2 tablets by mouth daily. 60 tablet 5   sodium phosphate (FLEET) 7-19 GM/118ML ENEM Place 133 mLs (1 enema total) rectally daily as needed for severe constipation. 266 mL 0   spironolactone (ALDACTONE) 25 MG tablet TAKE 1 TABLET BY MOUTH IN  THE MORNING 90 tablet 2   telmisartan (MICARDIS) 20 MG tablet Take 1 tablet (20 mg total) by mouth daily. 90 tablet 0   Vitamin E 400 units TABS Take 400 Units by mouth daily.      No current facility-administered medications for this visit.    REVIEW OF SYSTEMS:   .10 Point review of Systems was done is negative except as noted above.   PHYSICAL EXAMINATION  Vitals:   12/22/21 0938  BP: 116/64  Pulse: 74  Resp: 15  Temp: (!) 97.5 F (36.4 C)  SpO2: 97%   Filed Weights  12/22/21 0938  Weight: 113 lb 4.8 oz (51.4 kg)   .Body mass index is 22.88 kg/m. [place] Kitchen GENERAL:alert, in no acute distress and comfortable SKIN: no acute rashes, no significant lesions EYES: conjunctiva are pink and non-injected, sclera anicteric NECK: supple, no JVD LYMPH:  no palpable lymphadenopathy in the cervical, axillary or inguinal regions LUNGS: clear to auscultation b/l with normal respiratory effort HEART: regular rate & rhythm ABDOMEN:  normoactive bowel sounds , non tender, not distended. Extremity: no pedal edema PSYCH: alert & oriented x 3 with fluent speech NEURO: no focal motor/sensory deficits   LABORATORY DATA:  I have reviewed the data as listed  .    Latest Ref Rng & Units 12/22/2021    9:12 AM 12/04/2021   10:16 AM 07/07/2021    9:51 AM  CBC  WBC 4.0 - 10.5 K/uL 5.9  5.3  5.3   Hemoglobin 12.0 - 15.0 g/dL 11.8  12.0  12.5   Hematocrit 36.0 -  46.0 % 35.2  36.4  38.1   Platelets 150 - 400 K/uL 151  144  143     .    Latest Ref Rng & Units 12/22/2021    9:12 AM 12/04/2021   10:16 AM 10/23/2021    3:28 PM  CMP  Glucose 70 - 99 mg/dL 73  57  116   BUN 8 - 23 mg/dL '19  19  21   '$ Creatinine 0.44 - 1.00 mg/dL 0.73  0.77  0.81   Sodium 135 - 145 mmol/L 135  134  132   Potassium 3.5 - 5.1 mmol/L 4.7  4.7  4.3   Chloride 98 - 111 mmol/L 101  99  97   CO2 22 - 32 mmol/L '28  27  27   '$ Calcium 8.9 - 10.3 mg/dL 10.1  10.3  10.9   Total Protein 6.5 - 8.1 g/dL 6.5  6.9    Total Bilirubin 0.3 - 1.2 mg/dL 0.4  0.3    Alkaline Phos 38 - 126 U/L 69     AST 15 - 41 U/L 29  22    ALT 0 - 44 U/L 23  20       03/18/2020 Mammogram  05/30/2021 Mammogram   RADIOGRAPHIC STUDIES: I have personally reviewed the radiological images as listed and agreed with the findings in the report.  ASSESSMENT & PLAN:   88 y.o. female with  1. H/o Easily bruising - labs did not demonstrate any specific bleeding diathesis 2. Stage IB ER/PR/Her 2 negative, grade 3 left breast cancer s/p mastectomy. NegSNLBx. 06/04/2019. Was not considered to be a good candidate for adjuvant chemotherapy given her age and medical issues. 3. LUE lymphedema-left breast mastectomy and lymph node biopsy.  Controlled with use of lymphedema sleeve.  Discussed maintaining compliance with use of her sleeve.  PLAN: -Results from today CBC and CMP stable Patient has no clinical evidence suggestive of breast cancer recurrence/progression at this time. -Recommended pt wear arm sleeve continuously during day, even if asymptomatic at time.  -st cancer progression at this time. Will continue to monitor. -Continue 2000 IU Vitamin D daily -Will see back in 6 months with labs.  2)  Patient Active Problem List   Diagnosis Date Noted   Sciatica associated with disorder of multiple sites of spine 03/20/2021   Bipolar II disorder ([place]) 03/20/2021   Chronic pain syndrome 03/20/2021    Dementia without behavioral disturbance ([place]) 02/04/2020   Urinary retention 02/04/2020   Seasonal allergies 02/04/2020  Recurrent major depressive disorder, in partial remission ([place]) 07/17/2019   Status post left mastectomy 07/01/2019   Breast cancer of lower-outer quadrant of left female breast ([place]) 06/04/2019   Candida infection, oral 01/15/2019   Senile purpura ([place]) 04/14/2018   Lumbar post-laminectomy syndrome 12/03/2017   Lumbar spondylosis 12/03/2017   History of back surgery 09/01/2017   Constipation 09/01/2017   Hypothyroidism due to acquired atrophy of thyroid 09/01/2017   Mixed hyperlipidemia 09/01/2017   Age-related osteoporosis without current pathological fracture 09/01/2017   High risk medication use 09/01/2017   Arthritis of hand 05/17/2017   Atherosclerosis of native arteries of extremity with intermittent claudication ([place]) 05/15/2017   Status post total bilateral knee replacement 12/20/2016   Gait abnormality 07/16/2016   Chronic low back pain 07/16/2016   Mild cognitive impairment 07/16/2016   Wrist pain 05/10/2015   S/P left TKA 11/29/2014   S/P knee replacement 11/29/2014   Spontaneous bruising 08/27/2014   Numbness and tingling in right hand 07/28/2014   Essential tremor 07/28/2014   Dizziness 05/28/2014   Shaky 05/28/2014   Memory loss 05/28/2014   Depression 05/06/2014   Lipoma of left upper thigh 3x5 cm 09/28/2011   Cerebral vascular accident ([place]) 08/09/2011   Syncope 08/09/2011   History of breast cancer [id number], s/p BCT 2008, triple negative 01/26/2011   Chronic L breast pain with chronic recurrent seroma, s/p excisional biopsy 01/12/2010 01/26/2011   Fainted    ICD (implantable cardioverter-defibrillator), biventricular, in situ 78/67/6720   Chronic systolic heart failure ([place]) 08/02/2010   Essential hypertension 08/02/2010   -continue f/u with PCP for mx of other chronic medical issues.  FOLLOW UP: RTC with Dr Irene Limbo with labs in 6 months     The total time spent in the appointment was 15 minutes*.  All of the patient's questions were answered with apparent satisfaction. The patient knows to call the clinic with any problems, questions or concerns.   Sullivan Lone MD MS AAHIVMS [facility] Hematology/Oncology Physician [facility]  .*Total Encounter Time as defined by the Centers for Medicare and Medicaid Services includes, in addition to the face-to-face time of a patient visit (documented in the note above) non-face-to-face time: obtaining and reviewing outside history, ordering and reviewing medications, tests or procedures, care coordination (communications with other health care professionals or caregivers) and documentation in the medical record.

## 2022-01-16 NOTE — Therapy (Deleted)
OUTPATIENT PHYSICAL THERAPY FEMALE PELVIC EVALUATION   Patient Name: [name] MRN: [id number] DOB:1933/05/03, 88 y.o., female Today's Date: 01/16/2022    Past Medical History:  Diagnosis Date   Arthritis    Cancer ([place])    left breast cancer    Cataracts, bilateral    removed by surgery   CHF (congestive heart failure) ([place])    PACEMAKER & DEFIB   Complication of anesthesia    hypotensive after back surgery in 2006   Depression    Dyslipidemia    Fainted 04/21/06   AT [facility]   GERD (gastroesophageal reflux disease)    Headache(784.0)    Hearing loss    bilateral hearing aids   HLD (hyperlipidemia)    diet controlled    Hypertension    Hypothyroidism    ICD (implantable cardiac defibrillator) in place    pt has pacer/icd   ICD (implantable cardiac defibrillator), biventricular, in situ    LBBB (left bundle branch block)    Memory loss    Nonischemic cardiomyopathy ([place])    Normal coronary arteries    s/p cardiac cath 2007   Pacemaker    ICD [employer]   Syncope    Systolic CHF [facility])    Vertigo    Wears glasses    Past Surgical History:  Procedure Laterality Date   BACK SURGERY     lumbar fusion    BREAST LUMPECTOMY Left 2008   BREAST SURGERY  2000   LUMP REMOVAL. STAGE 1 CANCER   CARDIAC CATHETERIZATION     CATARACT EXTRACTION     COLONOSCOPY     EYE SURGERY     IMPLANTABLE CARDIOVERTER DEFIBRILLATOR GENERATOR CHANGE N/A 12/18/2012   Procedure: IMPLANTABLE CARDIOVERTER DEFIBRILLATOR GENERATOR CHANGE;  Surgeon: [name], MD;  Location: [facility] CATH LAB;  Service: Cardiovascular;  Laterality: N/A;   JOINT REPLACEMENT  06/14/01   right   LUMBAR FUSION  2006   MASS EXCISION  11/08/2011   Procedure: EXCISION MASS;  Surgeon: [name], MD;  Location: [facility] ORS;  Service: General;  Laterality: Left;  Excision Left Thigh Mass   MASTECTOMY W/ SENTINEL NODE BIOPSY Left 06/04/2019   Procedure: LEFT MASTECTOMY WITH SENTINEL LYMPH NODE BIOPSY;   Surgeon: [name], MD;  Location: [place];  Service: General;  Laterality: Left;   MASTECTOMY, PARTIAL  2008   GOT PACEMAKER AND DEFIB AT THAT TIME   PACEMAKER INSERTION  04/23/06   TOTAL KNEE ARTHROPLASTY  05/17/01   RIGHT KNEE   TOTAL KNEE ARTHROPLASTY Left 11/29/2014   Procedure: TOTAL LEFT KNEE ARTHROPLASTY;  Surgeon: Paralee Cancel, MD;  Location: [facility];  Service: Orthopedics;  Laterality: Left;   Patient Active Problem List   Diagnosis Date Noted   Sciatica associated with disorder of multiple sites of spine 03/20/2021   Bipolar II disorder ([place]) 03/20/2021   Chronic pain syndrome 03/20/2021   Dementia without behavioral disturbance ([place]) 02/04/2020   Urinary retention 02/04/2020   Seasonal allergies 02/04/2020   Recurrent major depressive disorder, in partial remission ([place]) 07/17/2019   Status post left mastectomy 07/01/2019   Breast cancer of lower-outer quadrant of left female breast ([place]) 06/04/2019   Candida infection, oral 01/15/2019   Senile purpura ([place]) 04/14/2018   Lumbar post-laminectomy syndrome 12/03/2017   Lumbar spondylosis 12/03/2017   History of back surgery 09/01/2017   Constipation 09/01/2017   Hypothyroidism due to acquired atrophy of thyroid 09/01/2017   Mixed hyperlipidemia 09/01/2017   Age-related osteoporosis without current pathological  fracture 09/01/2017   High risk medication use 09/01/2017   Arthritis of hand 05/17/2017   Atherosclerosis of native arteries of extremity with intermittent claudication ([place]) 05/15/2017   Status post total bilateral knee replacement 12/20/2016   Gait abnormality 07/16/2016   Chronic low back pain 07/16/2016   Mild cognitive impairment 07/16/2016   Wrist pain 05/10/2015   S/P left TKA 11/29/2014   S/P knee replacement 11/29/2014   Spontaneous bruising 08/27/2014   Numbness and tingling in right hand 07/28/2014   Essential tremor 07/28/2014   Dizziness 05/28/2014   Shaky 05/28/2014   Memory loss 05/28/2014    Depression 05/06/2014   Lipoma of left upper thigh 3x5 cm 09/28/2011   Cerebral vascular accident ([place]) 08/09/2011   Syncope 08/09/2011   History of breast cancer [id number], s/p BCT 2008, triple negative 01/26/2011   Chronic L breast pain with chronic recurrent seroma, s/p excisional biopsy 01/12/2010 01/26/2011   Fainted    ICD (implantable cardioverter-defibrillator), biventricular, in situ 67/03/4579   Chronic systolic heart failure ([place]) 08/02/2010   Essential hypertension 08/02/2010    PCP: [name], NP  REFERRING PROVIDER: [name], MD  REFERRING DIAG: N39.45 Continuous leakage of urine  THERAPY DIAG:  No diagnosis found.  Rationale for Evaluation and Treatment Rehabilitation  ONSET DATE: ***  SUBJECTIVE:                                                                                                                                                                                           SUBJECTIVE STATEMENT: *** Fluid intake: {Yes/No:304960894}    PAIN:  Are you having pain? {yes/no:20286} NPRS scale: ***/10 Pain location: {pelvic pain location:27098}  Pain type: {type:313116} Pain description: {PAIN DESCRIPTION:21022940}   Aggravating factors: *** Relieving factors: ***  PRECAUTIONS: Other: ICD, breast cancer  WEIGHT BEARING RESTRICTIONS No  FALLS:  Has patient fallen in last 6 months? {fallsyesno:27318}  LIVING ENVIRONMENT: Lives with: {[facility] lives with:25569::"lives with their family"} Lives in: {Lives in:25570} Stairs: {opstairs:27293} Has following equipment at home: {Assistive devices:23999}  OCCUPATION: ***  PLOF: {PLOF:24004}  PATIENT GOALS ***  PERTINENT HISTORY:  Osteoporosis;  CVA, Chronic systolic heart failure, Hypothyroidism; Dementia; Malignant neoplasm of lower-outer quadrant of left breast of female estrogen receptor negative Sexual abuse: {Yes/No:304960894}  BOWEL MOVEMENT Pain with bowel movement:  {yes/no:20286} Type of bowel movement:{PT BM type:27100} Fully empty rectum: {Yes/No:304960894} Leakage: {Yes/No:304960894} Pads: {Yes/No:304960894} Fiber supplement: {Yes/No:304960894}  URINATION Pain with urination: {yes/no:20286} Fully empty bladder: {Yes/No:304960894} Stream: {PT urination:27102} Urgency: {Yes/No:304960894} Frequency: *** Leakage: {PT leakage:27103} Pads: {Yes/No:304960894}  INTERCOURSE Pain with intercourse: {pain with intercourse PA:27099} Ability to have vaginal penetration:  {  JOA/CZ:660630160} Climax: *** Marinoff Scale: ***/3  PREGNANCY Vaginal deliveries *** Tearing {Yes***/No:304960894} C-section deliveries *** Currently pregnant {Yes***/No:304960894}  PROLAPSE {PT prolapse:27101}    OBJECTIVE:   DIAGNOSTIC FINDINGS:  ***  PATIENT SURVEYS:  {rehab surveys:24030}  PFIQ-7 ***  COGNITION:  Overall cognitive status: {cognition:24006}     SENSATION:  Light touch: {intact/deficits:24005}  Proprioception: {intact/deficits:24005}  MUSCLE LENGTH: Hamstrings: Right *** deg; Left *** deg Thomas test: Right *** deg; Left *** deg  LUMBAR SPECIAL TESTS:  {lumbar special test:25242}  FUNCTIONAL TESTS:  {Functional tests:24029}  GAIT: Distance walked: *** Assistive device utilized: {Assistive devices:23999} Level of assistance: {Levels of assistance:24026} Comments: ***               POSTURE: {posture:25561}   PELVIC ALIGNMENT:  LUMBARAROM/PROM  A/PROM A/PROM  eval  Flexion   Extension   Right lateral flexion   Left lateral flexion   Right rotation   Left rotation    (Blank rows = not tested)  LOWER EXTREMITY ROM:  {AROM/PROM:27142} ROM Right eval Left eval  Hip flexion    Hip extension    Hip abduction    Hip adduction    Hip internal rotation    Hip external rotation    Knee flexion    Knee extension    Ankle dorsiflexion    Ankle plantarflexion    Ankle inversion    Ankle eversion     (Blank rows = not  tested)  LOWER EXTREMITY MMT:  MMT Right eval Left eval  Hip flexion    Hip extension    Hip abduction    Hip adduction    Hip internal rotation    Hip external rotation    Knee flexion    Knee extension    Ankle dorsiflexion    Ankle plantarflexion    Ankle inversion    Ankle eversion      PALPATION:   General  ***                External Perineal Exam ***                             Internal Pelvic Floor ***  Patient confirms identification and approves PT to assess internal pelvic floor and treatment {yes/no:20286}  PELVIC MMT:   MMT eval  Vaginal   Internal Anal Sphincter   External Anal Sphincter   Puborectalis   Diastasis Recti   (Blank rows = not tested)        TONE: ***  PROLAPSE: ***  TODAY'S TREATMENT  EVAL ***   PATIENT EDUCATION:  Education details: *** Person educated: {Person educated:25204} Education method: {Education Method:25205} Education comprehension: {Education Comprehension:25206}   HOME EXERCISE PROGRAM: ***  ASSESSMENT:  CLINICAL IMPRESSION: Patient is a *** y.o. *** who was seen today for physical therapy evaluation and treatment for ***.    OBJECTIVE IMPAIRMENTS {opptimpairments:25111}.   ACTIVITY LIMITATIONS {activitylimitations:27494}  PARTICIPATION LIMITATIONS: {participationrestrictions:25113}  PERSONAL FACTORS {Personal factors:25162} are also affecting patient's functional outcome.   REHAB POTENTIAL: {rehabpotential:25112}  CLINICAL DECISION MAKING: {clinical decision making:25114}  EVALUATION COMPLEXITY: {Evaluation complexity:25115}   GOALS: Goals reviewed with patient? {yes/no:20286}  SHORT TERM GOALS: Target date: {follow up:25551}  *** Baseline: Goal status: {GOALSTATUS:25110}  2.  *** Baseline:  Goal status: {GOALSTATUS:25110}  3.  *** Baseline:  Goal status: {GOALSTATUS:25110}  4.  *** Baseline:  Goal status: {GOALSTATUS:25110}  5.  *** Baseline:  Goal status:  {GOALSTATUS:25110}  6.  *** Baseline:  Goal status: {GOALSTATUS:25110}  LONG TERM GOALS: Target date: {follow up:25551}   *** Baseline:  Goal status: {GOALSTATUS:25110}  2.  *** Baseline:  Goal status: {GOALSTATUS:25110}  3.  *** Baseline:  Goal status: {GOALSTATUS:25110}  4.  *** Baseline:  Goal status: {GOALSTATUS:25110}  5.  *** Baseline:  Goal status: {GOALSTATUS:25110}  6.  *** Baseline:  Goal status: {GOALSTATUS:25110}  PLAN: PT FREQUENCY: {rehab frequency:25116}  PT DURATION: {rehab duration:25117}  PLANNED INTERVENTIONS: {rehab planned interventions:25118::"Therapeutic exercises","Therapeutic activity","Neuromuscular re-education","Balance training","Gait training","Patient/Family education","Self Care","Joint mobilization"}  PLAN FOR NEXT SESSION: ***   Coreyon Nicotra, PT 01/16/2022, 8:24 AM

## 2022-01-17 ENCOUNTER — Ambulatory Visit: Payer: Medicare Other | Admitting: Physical Therapy

## 2022-01-17 ENCOUNTER — Encounter: Payer: Self-pay | Admitting: Family

## 2022-01-17 ENCOUNTER — Ambulatory Visit (INDEPENDENT_AMBULATORY_CARE_PROVIDER_SITE_OTHER): Payer: Medicare Other | Admitting: Family

## 2022-01-17 VITALS — BP 140/90 | HR 73 | Temp 97.7°F | Resp 16 | Ht <= 58 in | Wt 111.0 lb

## 2022-01-17 DIAGNOSIS — R5383 Other fatigue: Secondary | ICD-10-CM | POA: Diagnosis not present

## 2022-01-17 DIAGNOSIS — R6883 Chills (without fever): Secondary | ICD-10-CM | POA: Diagnosis not present

## 2022-01-17 DIAGNOSIS — R42 Dizziness and giddiness: Secondary | ICD-10-CM

## 2022-01-17 LAB — CBC WITH DIFFERENTIAL/PLATELET
Absolute Monocytes: 490 cells/uL (ref 200–950)
Basophils Absolute: 163 cells/uL (ref 0–200)
Basophils Relative: 2.3 %
Eosinophils Absolute: 149 cells/uL (ref 15–500)
Eosinophils Relative: 2.1 %
HCT: 38 % (ref 35.0–45.0)
Hemoglobin: 12.3 g/dL (ref 11.7–15.5)
Lymphs Abs: 1200 cells/uL (ref 850–3900)
MCH: 29.4 pg (ref 27.0–33.0)
MCHC: 32.4 g/dL (ref 32.0–36.0)
MCV: 90.7 fL (ref 80.0–100.0)
MPV: 12.3 fL (ref 7.5–12.5)
Monocytes Relative: 6.9 %
Neutro Abs: 5098 cells/uL (ref 1500–7800)
Neutrophils Relative %: 71.8 %
Platelets: 144 10*3/uL (ref 140–400)
RBC: 4.19 10*6/uL (ref 3.80–5.10)
RDW: 13.6 % (ref 11.0–15.0)
Total Lymphocyte: 16.9 %
WBC: 7.1 10*3/uL (ref 3.8–10.8)

## 2022-01-17 MED ORDER — MECLIZINE HCL 12.5 MG PO TABS: 12.5000 mg | ORAL_TABLET | Freq: Three times a day (TID) | ORAL | 1 refills | Status: DC | PRN

## 2022-01-17 NOTE — Progress Notes (Signed)
Provider: Jakson Delpilar FNP-C  [name], NP  Patient Care Team: [name], NP as PCP - General (Geriatric Medicine) [name], MD as PCP - Electrophysiology (Cardiology) [name], MD as Consulting Physician (General Surgery) Paralee Cancel, MD as Consulting Physician (Orthopedic Surgery) Marica Otter, [place] (Optometry) Marcial Pacas, MD as Consulting Physician (Neurology) [name], MD as Consulting Physician (Cardiology) Ricard Dillon, MD (Psychiatry)  Extended Emergency Contact Information Primary Emergency Contact: Juel Burrow Address: [address]          [place], [place] [address] Johnnette Litter of [place] Phone: [phone] Relation: Daughter Secondary Emergency Contact: [facility] Address: [address]          [place],  [address] Johnnette Litter of [place] Phone: [phone] Relation: Friend  Code Status:  DNR Goals of care: Advanced Directive information    01/17/2022   10:44 AM  Advanced Directives  Does Patient Have a Medical Advance Directive? Yes  Type of Paramedic of [place];Living will;Out of facility DNR (pink MOST or yellow form)  Does patient want to make changes to medical advance directive? No - Patient declined  Copy of [place] in Chart? Yes - validated most recent copy scanned in chart (See row information)     Chief Complaint  Patient presents with   Acute Visit    Patient received Covid Booster/Flu vaccine 01-12-2022. Patient is experiencing symptoms such as dizziness, soreness, chills, and fatigue.     HPI:  Pt is a 88 y.o. female seen today for an acute visit for evaluation of dizziness, chills, fatigue and soreness after recent COVID booster and flu vaccine minimization on 01/12/2022.  She is here with the daughter who provides additional HPI information. Patient received COVID-19 vaccine and flu shot at the same day.  States had above  symptoms which have resolved.  She denies any fever, She denies any fever,chills,cough,fatigue,body aches,runny nose,chest tightness,wheezing,chest pain,palpitation or shortness of breath.  Also denies any rash, swelling or redness injection site.    Past Medical History:  Diagnosis Date   Arthritis    Cancer ([place])    left breast cancer    Cataracts, bilateral    removed by surgery   CHF (congestive heart failure) ([place])    PACEMAKER & DEFIB   Complication of anesthesia    hypotensive after back surgery in 2006   Depression    Dyslipidemia    Fainted 04/21/06   AT [facility]   GERD (gastroesophageal reflux disease)    Headache(784.0)    Hearing loss    bilateral hearing aids   HLD (hyperlipidemia)    diet controlled    Hypertension    Hypothyroidism    ICD (implantable cardiac defibrillator) in place    pt has pacer/icd   ICD (implantable cardiac defibrillator), biventricular, in situ    LBBB (left bundle branch block)    Memory loss    Nonischemic cardiomyopathy ([place])    Normal coronary arteries    s/p cardiac cath 2007   Pacemaker    ICD [employer]   Syncope    Systolic CHF [facility])    Vertigo    Wears glasses    Past Surgical History:  Procedure Laterality Date   BACK SURGERY     lumbar fusion    BREAST LUMPECTOMY Left 2008   BREAST SURGERY  2000   LUMP REMOVAL. STAGE 1 CANCER   CARDIAC CATHETERIZATION     CATARACT EXTRACTION  COLONOSCOPY     EYE SURGERY     IMPLANTABLE CARDIOVERTER DEFIBRILLATOR GENERATOR CHANGE N/A 12/18/2012   Procedure: IMPLANTABLE CARDIOVERTER DEFIBRILLATOR GENERATOR CHANGE;  Surgeon: [name], MD;  Location: [facility] CATH LAB;  Service: Cardiovascular;  Laterality: N/A;   JOINT REPLACEMENT  06/14/01   right   LUMBAR FUSION  2006   MASS EXCISION  11/08/2011   Procedure: EXCISION MASS;  Surgeon: [name], MD;  Location: [facility];  Service: General;  Laterality: Left;  Excision Left Thigh Mass   MASTECTOMY W/ SENTINEL NODE BIOPSY Left  06/04/2019   Procedure: LEFT MASTECTOMY WITH SENTINEL LYMPH NODE BIOPSY;  Surgeon: [name], MD;  Location: [facility];  Service: General;  Laterality: Left;   MASTECTOMY, PARTIAL  2008   GOT PACEMAKER AND DEFIB AT THAT TIME   PACEMAKER INSERTION  04/23/06   TOTAL KNEE ARTHROPLASTY  05/17/01   RIGHT KNEE   TOTAL KNEE ARTHROPLASTY Left 11/29/2014   Procedure: TOTAL LEFT KNEE ARTHROPLASTY;  Surgeon: Paralee Cancel, MD;  Location: [facility];  Service: Orthopedics;  Laterality: Left;    Allergies  Allergen Reactions   Iodine Shortness Of Breath    Iodine contrast, CHF , SOB   Shellfish Allergy Shortness Of Breath   Memantine     Malaise, fogginess/ couldn't think   Aspirin Nausea And Vomiting   Codeine Nausea And Vomiting    Outpatient Encounter Medications as of 01/17/2022  Medication Sig   acetaminophen (TYLENOL) 500 MG tablet Take 500 mg by mouth in the morning and at bedtime. 500 mg in the morning and afternoon   acetaminophen (TYLENOL) 650 MG CR tablet Take 650 mg by mouth at bedtime.   albuterol (VENTOLIN HFA) 108 (90 Base) MCG/ACT inhaler Inhale 2 puffs into the lungs every 6 (six) hours as needed for wheezing or shortness of breath.   ascorbic acid (VITAMIN C) 500 MG tablet Take 500 mg by mouth daily.   buPROPion (WELLBUTRIN XL) 150 MG 24 hr tablet Take 1 tablet (150 mg total) by mouth daily.   carvedilol (COREG) 3.125 MG tablet TAKE 1 TABLET BY MOUTH  TWICE DAILY   Cholecalciferol (VITAMIN D3) 50 MCG (2000 UT) TABS Take 2,000 Units by mouth daily.    diclofenac Sodium (VOLTAREN) 1 % GEL Apply 4 g topically 4 (four) times daily.   DULoxetine (CYMBALTA) 60 MG capsule Take 60 mg by mouth daily.   famotidine (PEPCID) 10 MG tablet Take 10 mg by mouth as needed for heartburn or indigestion.   fluticasone (FLONASE) 50 MCG/ACT nasal spray Place 2 sprays into both nostrils daily.   furosemide (LASIX) 20 MG tablet Take 1 tablet (20 mg total) by mouth daily as needed.   levothyroxine (SYNTHROID,  LEVOTHROID) 100 MCG tablet Take 1 tablet (100 mcg total) by mouth daily before breakfast.   Lidocaine 4 % PTCH Apply 1 patch topically every 12 (twelve) hours. Apply to back   lubiprostone (AMITIZA) 8 MCG capsule Take 1 capsule (8 mcg total) by mouth 2 (two) times daily with a meal.   Magnesium 250 MG TABS Take 250 mg by mouth daily.   meclizine (ANTIVERT) 12.5 MG tablet Take 1 tablet (12.5 mg total) by mouth 3 (three) times daily as needed for dizziness.   mirabegron ER (MYRBETRIQ) 50 MG TB24 tablet Take 1 tablet (50 mg total) by mouth daily.   NALTREXONE HCL PO Take 4 mg by mouth at bedtime.   NONFORMULARY OR COMPOUNDED ITEM Apply 120 Tubes topically daily. Diclofenac/Cyclobenzaprine/lamotrigine/lidocaine/prilocaine (10480) 2%/2%/6%/5%/1.25%  cream QTY: 120 GM SIG: (NEURO) APPLY 1-2 PUMPS (1-2 GMS) TO AFFECTED AREA (S) OR FOCAL POINTS 3 TO 4 TIMES DAILY. RUB IN FOR 2 MINUTES TO ACHIEVE MAX PENETRATION. [place] HANDS WELL.   Omega-3 Fatty Acids (FISH OIL PO) Take 1 capsule by mouth daily.   omeprazole (PRILOSEC) 40 MG capsule TAKE 1 CAPSULE BY MOUTH DAILY   polyethylene glycol powder (GLYCOLAX/MIRALAX) 17 GM/SCOOP powder Take 1 Container by mouth daily.   pregabalin (LYRICA) 25 MG capsule Take 1 capsule (25 mg total) by mouth 3 (three) times daily. 1 in AM and 2 at night- for back/nerve pain   senna-docusate (SENOKOT S) 8.6-50 MG tablet Take 2 tablets by mouth daily.   sodium phosphate (FLEET) 7-19 GM/118ML ENEM Place 133 mLs (1 enema total) rectally daily as needed for severe constipation.   spironolactone (ALDACTONE) 25 MG tablet TAKE 1 TABLET BY MOUTH IN  THE MORNING   telmisartan (MICARDIS) 20 MG tablet Take 1 tablet (20 mg total) by mouth daily.   Vitamin E 400 units TABS Take 400 Units by mouth daily.    No facility-administered encounter medications on file as of 01/17/2022.    Review of Systems  Constitutional:  Negative for appetite change, chills, fatigue, fever and unexpected weight  change.  HENT:  Negative for congestion, dental problem, ear discharge, ear pain, facial swelling, hearing loss, nosebleeds, postnasal drip, rhinorrhea, sinus pressure, sinus pain, sneezing, sore throat, tinnitus and trouble swallowing.   Eyes:  Negative for pain, discharge, redness, itching and visual disturbance.       Had chills,fatigue and dizziness after receiving COVID-19 booster and Influenza vaccine on 01/12/2022 symptoms resolved.    Respiratory:  Negative for cough, chest tightness, shortness of breath and wheezing.   Cardiovascular:  Negative for chest pain, palpitations and leg swelling.  Gastrointestinal:  Negative for abdominal distention, abdominal pain, blood in stool, constipation, diarrhea, nausea and vomiting.  Endocrine: Negative for cold intolerance, heat intolerance, polydipsia, polyphagia and polyuria.  Genitourinary:  Negative for difficulty urinating, dysuria, flank pain, frequency and urgency.  Musculoskeletal:  Negative for arthralgias, back pain, gait problem, joint swelling, myalgias, neck pain and neck stiffness.  Skin:  Negative for color change, pallor, rash and wound.  Neurological:  Positive for dizziness. Negative for syncope, speech difficulty, weakness, numbness and headaches.  Hematological:  Does not bruise/bleed easily.  Psychiatric/Behavioral:  Negative for agitation, behavioral problems, confusion, hallucinations, self-injury, sleep disturbance and suicidal ideas. The patient is not nervous/anxious.     Immunization History  Administered Date(s) Administered   Fluad Quad(high Dose 65+) 01/28/2020, 01/17/2021   Influenza, High Dose Seasonal PF 01/16/2018, 12/08/2018   Influenza-Unspecified 12/08/2013, 01/10/2015, 12/09/2015, 01/12/2017, 01/17/2021   PFIZER(Purple Top)SARS-COV-2 Vaccination 05/23/2019, 06/15/2019, 12/23/2019, 08/30/2020   Pfizer Covid-19 Vaccine Bivalent Booster 96yr & up 02/03/2021   Pneumococcal Conjugate-13 04/09/2012   Pneumococcal  Polysaccharide-23 02/28/2016   Tdap 01/07/2013   Zoster Recombinat (Shingrix) 02/03/2021, 06/26/2021   Zoster, Live 08/05/2014, 02/03/2021   Pertinent  Health Maintenance Due  Topic Date Due   INFLUENZA VACCINE  11/07/2021   MAMMOGRAM  03/28/2022   DEXA SCAN  Completed      10/02/2021    3:18 PM 10/23/2021    2:57 PM 11/25/2021    2:27 PM 12/04/2021    9:41 AM 01/17/2022   10:44 AM  Fall Risk  Falls in the past year? 0 0  0 0  Was there an injury with Fall?  0  0 0  Fall Risk Category  Calculator  0  0 0  Fall Risk Category  Low  Low Low  Patient Fall Risk Level  Low fall risk Low fall risk Low fall risk Low fall risk  Patient at Risk for Falls Due to  No Fall Risks  No Fall Risks No Fall Risks  Fall risk Follow up  Falls evaluation completed   Falls evaluation completed   Functional Status Survey:    Vitals:   01/17/22 1037  BP: (!) 140/90  Pulse: 73  Resp: 16  Temp: 97.7 F (36.5 C)  SpO2: 96%  Weight: 111 lb (50.3 kg)  Height: '4\' 10"'$  (1.473 m)   Body mass index is 23.2 kg/m. Physical Exam Vitals reviewed.  Constitutional:      General: She is not in acute distress.    Appearance: Normal appearance. She is normal weight. She is not ill-appearing or diaphoretic.  HENT:     Head: Normocephalic.     Right Ear: Tympanic membrane, ear canal and external ear normal. There is no impacted cerumen.     Left Ear: Tympanic membrane, ear canal and external ear normal. There is no impacted cerumen.     Nose: Nose normal. No congestion or rhinorrhea.     Mouth/Throat:     Mouth: Mucous membranes are moist.     Pharynx: Oropharynx is clear. No oropharyngeal exudate or posterior oropharyngeal erythema.  Eyes:     General: No scleral icterus.       Right eye: No discharge.        Left eye: No discharge.     Extraocular Movements: Extraocular movements intact.     Conjunctiva/sclera: Conjunctivae normal.     Pupils: Pupils are equal, round, and reactive to light.  Neck:      Vascular: No carotid bruit.  Cardiovascular:     Rate and Rhythm: Normal rate and regular rhythm.     Pulses: Normal pulses.     Heart sounds: Normal heart sounds. No murmur heard.    No friction rub. No gallop.     Comments: Left breast absent  Pulmonary:     Effort: Pulmonary effort is normal. No respiratory distress.     Breath sounds: Normal breath sounds. No wheezing, rhonchi or rales.  Chest:     Chest wall: No tenderness.  Abdominal:     General: Bowel sounds are normal. There is no distension.     Palpations: Abdomen is soft. There is no mass.     Tenderness: There is no abdominal tenderness. There is no right CVA tenderness, left CVA tenderness, guarding or rebound.  Musculoskeletal:        General: No swelling or tenderness. Normal range of motion.     Cervical back: Normal range of motion. No rigidity or tenderness.     Right lower leg: No edema.     Left lower leg: No edema.  Lymphadenopathy:     Cervical: No cervical adenopathy.  Skin:    General: Skin is warm and dry.     Coloration: Skin is not pale.     Findings: No bruising, erythema, lesion or rash.  Neurological:     Mental Status: She is alert and oriented to person, place, and time.     Cranial Nerves: No cranial nerve deficit.     Sensory: No sensory deficit.     Motor: No weakness.     Coordination: Coordination normal.     Gait: Gait normal.  Psychiatric:  Mood and Affect: Mood normal.        Speech: Speech normal.        Behavior: Behavior normal.        Thought Content: Thought content normal.        Judgment: Judgment normal.     Labs reviewed: Recent Labs    10/23/21 1528 12/04/21 1016 12/22/21 0912  NA 132* 134* 135  K 4.3 4.7 4.7  CL 97* 99 101  CO2 '27 27 28  '$ GLUCOSE 116 57* 73  BUN '21 19 19  '$ CREATININE 0.81 0.77 0.73  CALCIUM 10.9* 10.3 10.1   Recent Labs    06/20/21 0945 07/07/21 0951 12/04/21 1016 12/22/21 0912  AST '22 29 22 29  '$ ALT '18 18 20 23  '$ ALKPHOS 60  --    --  69  BILITOT 0.4 0.4 0.3 0.4  PROT 7.0 7.2 6.9 6.5  ALBUMIN 4.0  --   --  3.6   Recent Labs    07/07/21 0951 12/04/21 1016 12/22/21 0912  WBC 5.3 5.3 5.9  NEUTROABS 3,344 3,466 3.7  HGB 12.5 12.0 11.8*  HCT 38.1 36.4 35.2*  MCV 88.8 90.1 88.7  PLT 143 144 151   Lab Results  Component Value Date   TSH 0.81 06/02/2021   No results found for: "HGBA1C" Lab Results  Component Value Date   CHOL 171 11/17/2020   HDL 63 11/17/2020   LDLCALC 90 11/17/2020   TRIG 86 11/17/2020   CHOLHDL 2.7 11/17/2020    Significant Diagnostic Results in last 30 days:  CT Abdomen Pelvis W Contrast  Result Date: 12/28/2021 CLINICAL DATA:  Acute abdominal pain. Patient reports left lower quadrant pain. EXAM: CT ABDOMEN AND PELVIS WITH CONTRAST TECHNIQUE: Multidetector CT imaging of the abdomen and pelvis was performed using the standard protocol following bolus administration of intravenous contrast. RADIATION DOSE REDUCTION: This exam was performed according to the departmental dose-optimization program which includes automated exposure control, adjustment of the mA and/or kV according to patient size and/or use of iterative reconstruction technique. CONTRAST:  162m OMNIPAQUE IOHEXOL 300 MG/ML  SOLN Patient was pre-medicated prior to the exam.  No reported reaction. COMPARISON:  Noncontrast exam 09/17/2020 FINDINGS: Lower chest: Interstitial lung disease that was assessed on high-resolution chest CT 09/09/2020. There has been slight progression in ground-glass opacities from prior chest CT. Normal heart size with pacemaker is partially included. Hepatobiliary: Stable cysts in the left lobe of the liver, needing no further radiologic follow-up. No new hepatic lesion. Gallbladder physiologically distended, no calcified stone. No biliary dilatation. Pancreas: No ductal dilatation or inflammation. No evidence of pancreatic mass. Spleen: Normal in size without focal abnormality. Adrenals/Urinary Tract: Normal  adrenal glands. No hydronephrosis or perinephric edema. Homogeneous renal enhancement with symmetric excretion on delayed phase imaging. There are small bilateral renal cysts as well as low-density renal lesions too small to accurately characterize. No follow-up imaging is recommended per consensus guidelines urinary bladder is physiologically distended without wall thickening. Stomach/Bowel: Minimal wall thickening of the distal esophagus. Enteric contrast has progressed through the stomach which is not decompressed and not well assessed on the current exam. There is no small bowel obstruction, no small bowel inflammation. Normal appendix, series 6, image 47. Moderate volume of stool in the colon. No abnormal rectal distention. No colonic wall thickening. No significant diverticular disease. No evidence of colonic mass. Vascular/Lymphatic: Aortic atherosclerosis and tortuosity. No aneurysm. Patent portal vein. No adenopathy. Reproductive: Left uterine fundal calcification may represent degenerated fibroid. No  adnexal mass. There is a pessary in place. Other: No free air or ascites.  No abdominal wall hernia. Musculoskeletal: Posterior lumbar fusion hardware. No focal bone lesion or acute osseous findings. IMPRESSION: 1. No acute abnormality in the abdomen/pelvis. 2. Moderate colonic stool burden, possible constipation. No bowel obstruction or inflammation. 3. Minimal wall thickening of the distal esophagus, can be seen with reflux or esophagitis. 4. Interstitial lung disease in the lung bases that was assessed on high-resolution chest CT 09/09/2020. There has been slight progression in ground-glass opacities from prior chest CT. Aortic Atherosclerosis (ICD10-I70.0). Electronically Signed   By: Keith Rake M.D.   On: 12/28/2021 19:46    Assessment/Plan 1. Dizziness Not orthostatic  - start on meclizine  - meclizine (ANTIVERT) 12.5 MG tablet; Take 1 tablet (12.5 mg total) by mouth 3 (three) times daily as  needed for dizziness.  Dispense: 30 tablet; Refill: 1 - CBC with Differential/Platelet  2. Fatigue, unspecified type Afebrile  - CBC with Differential/Platelet  3. Chills Afebrile  No chills during visit. - CBC with Differential/Platelet  Family/ staff Communication: Reviewed plan of care with patient verbalized understanding   Labs/tests ordered: - CBC with Differential/Platelet  Next Appointment: Return if symptoms worsen or fail to improve.   Sandrea Hughs, NP

## 2022-01-22 ENCOUNTER — Encounter
Payer: Medicare Other | Attending: Physical Medicine and Rehabilitation | Admitting: Physical Medicine and Rehabilitation

## 2022-01-22 ENCOUNTER — Encounter: Payer: Self-pay | Admitting: Physical Medicine and Rehabilitation

## 2022-01-22 VITALS — BP 133/82 | HR 68 | Ht <= 58 in | Wt 112.2 lb

## 2022-01-22 DIAGNOSIS — G3184 Mild cognitive impairment, so stated: Secondary | ICD-10-CM | POA: Insufficient documentation

## 2022-01-22 DIAGNOSIS — M539 Dorsopathy, unspecified: Secondary | ICD-10-CM | POA: Diagnosis not present

## 2022-01-22 DIAGNOSIS — G894 Chronic pain syndrome: Secondary | ICD-10-CM | POA: Insufficient documentation

## 2022-01-22 DIAGNOSIS — M5430 Sciatica, unspecified side: Secondary | ICD-10-CM | POA: Insufficient documentation

## 2022-01-22 NOTE — Patient Instructions (Signed)
Pt is an 89 yr old female with hx of breast CA and RUE lymphedema, as well as uterine prolapse, NICCM- as well as Chronic low back pain with B/L sciatica- also has memory issues on Aricept; hx of lumbar fusion at L4/5. Has a hx of Bipolar Type II depression. Is on Zoloft 200 mg daily, Trilpetal and Wellbutrin for mood.  Here for f/u on Lumbar radiculopathy  Continue the Lyrica '25mg'$  nightly, but stop during day.  If pain gets worse again, can restart the daytime dose; you can try to do 50 mg nightly instead of 25 mg 2x/day. Just refilled 12/27/21, so doesn't need refills.   2. Let's move Cymbalta to night time- to try and help the sleepiness during the day.    3.  Continue Naltrexone 4 mg nightly- - last got filled (original Rx) 10/02/21. Call me when has 1 week supply left. Don't want to decrease dose because 2 mg was not as effective.   4. F/U  3 months- chronic pain

## 2022-01-22 NOTE — Progress Notes (Signed)
Subjective:    Patient ID: [name], female    DOB: 03-08-1934, 88 y.o.   MRN: [id number]  HPI    Pt is an 88 yr old female with hx of breast CA and RUE lymphedema, as well as uterine prolapse, NICCM- as well as Chronic low back pain with B/L sciatica- also has memory issues on Aricept; hx of lumbar fusion at L4/5. Has a hx of Bipolar Type II depression. Is on Zoloft 200 mg daily, Trilpetal and Wellbutrin for mood.  Here for f/u on Lumbar radiculopathy   Thinks low dose naltrexone helps some, but it makes her sleepy "all day".  A little drowsy.   Still taking it.   Thinks has helped ~ 50%- hasn't had her say any more lately that are 7-8/10 lately.  Rating it 5-6/10 lately, so not as high as it used to be.    Taking Low dose Naltrexone 4 mg nightly.  Wasn't as effective when at '2mg'$  nightly.   Takes Cymbalta during the day and Lyrica 25 mg BID.  Last got Lyrica filled 9/20 and Cymbalta? Gets from Psychiatry  Asking about R 4th digit- really painful- would it cause trigger finger- doesn't have any right now.    Pain Inventory Average Pain 6 Pain Right Now 6 My pain is dull  In the last 24 hours, has pain interfered with the following? General activity 2 Relation with others 0 Enjoyment of life 2 What TIME of day is your pain at its worst? morning  Sleep (in general) Fair  Pain is worse with: bending, standing, and some activites Pain improves with: rest, therapy/exercise, and pacing activities Relief from Meds:  can't answer  Family History  Problem Relation Age of Onset   Hypertension Mother    Arthritis Mother    Hypertension Father    Hypertension Brother    Hypertension Brother    Social History   Socioeconomic History   Marital status: Married    Spouse name: Not on file   Number of children: 1   Years of education: Masters   Highest education level: Not on file  Occupational History   Occupation: Retired  Tobacco Use   Smoking status:  Never    Passive exposure: Never   Smokeless tobacco: Never  Vaping Use   Vaping Use: Never used  Substance and Sexual Activity   Alcohol use: No   Drug use: No   Sexual activity: Not Currently    Birth control/protection: Post-menopausal  Other Topics Concern   Not on file  Social History Narrative   Lives at home with husband.   Right-handed.      As of 07/28/2014   Diet: No special diet   Caffeine: yes, Chocolate, tea and sodas    Married: YES, 1970   House: Yes, 2 stories, 2-3 persons live in home   Pets: No   Current/Past profession: Engineer, mining, Designer, jewellery    Exercise: Yes 2-3 x weekly   Living Will: Yes   DNR: No   POA/HPOA: No      Social Determinants of Health   Financial Resource Strain: Low Risk  (03/27/2017)   Overall Financial Resource Strain (CARDIA)    Difficulty of Paying Living Expenses: Not hard at all  Food Insecurity: No Food Insecurity (03/27/2017)   Hunger Vital Sign    Worried About Running Out of Food in the Last Year: Never true    Ran Out of Food in the Last Year: Never true  Transportation Needs: No Transportation Needs (03/27/2017)   PRAPARE - Hydrologist (Medical): No    Lack of Transportation (Non-Medical): No  Physical Activity: Insufficiently Active (03/27/2017)   Exercise Vital Sign    Days of Exercise per Week: 4 days    Minutes of Exercise per Session: 30 min  Stress: Stress Concern Present (03/27/2017)   [place]    Feeling of Stress : To some extent  Social Connections: Socially Integrated (03/27/2017)   Social Connection and Isolation Panel [NHANES]    Frequency of Communication with Friends and Family: More than three times a week    Frequency of Social Gatherings with Friends and Family: Once a week    Attends Religious Services: More than 4 times per year    Active Member of [employer] or Organizations: Yes    Attends English as a second language teacher Meetings: Never    Marital Status: Married   Past Surgical History:  Procedure Laterality Date   BACK SURGERY     lumbar fusion    BREAST LUMPECTOMY Left 2008   BREAST SURGERY  2000   LUMP REMOVAL. STAGE 1 CANCER   CARDIAC CATHETERIZATION     CATARACT EXTRACTION     COLONOSCOPY     EYE SURGERY     IMPLANTABLE CARDIOVERTER DEFIBRILLATOR GENERATOR CHANGE N/A 12/18/2012   Procedure: IMPLANTABLE CARDIOVERTER DEFIBRILLATOR GENERATOR CHANGE;  Surgeon: [name], MD;  Location: [facility] CATH LAB;  Service: Cardiovascular;  Laterality: N/A;   JOINT REPLACEMENT  06/14/01   right   LUMBAR FUSION  2006   MASS EXCISION  11/08/2011   Procedure: EXCISION MASS;  Surgeon: [name], MD;  Location: [facility];  Service: General;  Laterality: Left;  Excision Left Thigh Mass   MASTECTOMY W/ SENTINEL NODE BIOPSY Left 06/04/2019   Procedure: LEFT MASTECTOMY WITH SENTINEL LYMPH NODE BIOPSY;  Surgeon: [name], MD;  Location: [place];  Service: General;  Laterality: Left;   MASTECTOMY, PARTIAL  2008   GOT PACEMAKER AND DEFIB AT THAT TIME   PACEMAKER INSERTION  04/23/06   TOTAL KNEE ARTHROPLASTY  05/17/01   RIGHT KNEE   TOTAL KNEE ARTHROPLASTY Left 11/29/2014   Procedure: TOTAL LEFT KNEE ARTHROPLASTY;  Surgeon: Paralee Cancel, MD;  Location: [facility];  Service: Orthopedics;  Laterality: Left;   Past Surgical History:  Procedure Laterality Date   BACK SURGERY     lumbar fusion    BREAST LUMPECTOMY Left 2008   BREAST SURGERY  2000   LUMP REMOVAL. STAGE 1 CANCER   CARDIAC CATHETERIZATION     CATARACT EXTRACTION     COLONOSCOPY     EYE SURGERY     IMPLANTABLE CARDIOVERTER DEFIBRILLATOR GENERATOR CHANGE N/A 12/18/2012   Procedure: IMPLANTABLE CARDIOVERTER DEFIBRILLATOR GENERATOR CHANGE;  Surgeon: [name], MD;  Location: [facility] CATH LAB;  Service: Cardiovascular;  Laterality: N/A;   JOINT REPLACEMENT  06/14/01   right   LUMBAR FUSION  2006   MASS EXCISION  11/08/2011   Procedure: EXCISION MASS;   Surgeon: [name], MD;  Location: [facility];  Service: General;  Laterality: Left;  Excision Left Thigh Mass   MASTECTOMY W/ SENTINEL NODE BIOPSY Left 06/04/2019   Procedure: LEFT MASTECTOMY WITH SENTINEL LYMPH NODE BIOPSY;  Surgeon: [name], MD;  Location: [place];  Service: General;  Laterality: Left;   MASTECTOMY, PARTIAL  2008   GOT PACEMAKER AND DEFIB AT THAT TIME   PACEMAKER INSERTION  04/23/06   TOTAL KNEE ARTHROPLASTY  05/17/01   RIGHT KNEE   TOTAL KNEE ARTHROPLASTY Left 11/29/2014   Procedure: TOTAL LEFT KNEE ARTHROPLASTY;  Surgeon: Paralee Cancel, MD;  Location: [facility];  Service: Orthopedics;  Laterality: Left;   Past Medical History:  Diagnosis Date   Arthritis    Cancer ([place])    left breast cancer    Cataracts, bilateral    removed by surgery   CHF (congestive heart failure) ([place])    PACEMAKER & DEFIB   Complication of anesthesia    hypotensive after back surgery in 2006   Depression    Dyslipidemia    Fainted 04/21/06   AT [facility]   GERD (gastroesophageal reflux disease)    Headache(784.0)    Hearing loss    bilateral hearing aids   HLD (hyperlipidemia)    diet controlled    Hypertension    Hypothyroidism    ICD (implantable cardiac defibrillator) in place    pt has pacer/icd   ICD (implantable cardiac defibrillator), biventricular, in situ    LBBB (left bundle branch block)    Memory loss    Nonischemic cardiomyopathy ([place])    Normal coronary arteries    s/p cardiac cath 2007   Pacemaker    ICD [facility]   Syncope    Systolic CHF [facility])    Vertigo    Wears glasses    BP 133/82   Pulse 68   Ht '4\' 10"'$  (1.473 m)   Wt 112 lb 3.2 oz (50.9 kg)   LMP  (LMP Unknown)   SpO2 95%   BMI 23.45 kg/m   Opioid Risk Score:   Fall Risk Score:  `1  Depression screen [facility] 2/9     01/22/2022   11:26 AM 10/02/2021    3:18 PM 06/19/2021    1:44 PM 03/20/2021    1:11 PM 02/04/2020   10:10 AM 11/06/2019    2:41 PM 04/15/2019   10:15 AM  Depression screen PHQ  2/9  Decreased Interest 1 0 0 2 0 3 0  Down, Depressed, Hopeless 1 0 0 2 0 1 0  PHQ - 2 Score 2 0 0 4 0 4 0  Altered sleeping    2  3   Tired, decreased energy    3  3   Change in appetite    1  0   Feeling bad or failure about yourself     1  0   Trouble concentrating    1  1   Moving slowly or fidgety/restless    1  0   Suicidal thoughts    0  0   PHQ-9 Score    13  11   Difficult doing work/chores    Somewhat difficult       Review of Systems  Constitutional: Negative.   HENT: Negative.    Eyes: Negative.   Respiratory: Negative.    Cardiovascular: Negative.   Gastrointestinal: Negative.   Endocrine: Negative.   Genitourinary: Negative.   Musculoskeletal:  Positive for back pain.  Skin: Negative.   Allergic/Immunologic: Negative.   Neurological: Negative.   Hematological: Negative.   Psychiatric/Behavioral:  Positive for dysphoric mood.   All other systems reviewed and are negative.      Objective:   Physical Exam  Awake, alert, frail appearing; using single point cane ot walk; accompanied by family member, NAD  Band across low back where has pain     Assessment & Plan:  Pt is an 88 yr old female with hx of breast CA and RUE lymphedema, as well as uterine prolapse, NICCM- as well as Chronic low back pain with B/L sciatica- also has memory issues on Aricept; hx of lumbar fusion at L4/5. Has a hx of Bipolar Type II depression. Is on Zoloft 200 mg daily, Trilpetal and Wellbutrin for mood.  Here for f/u on Lumbar radiculopathy  Continue the Lyrica '25mg'$  nightly, but stop during day.  If pain gets worse again, can restart the daytime dose; you can try to do 50 mg nightly instead of 25 mg 2x/day. Just refilled 12/27/21, so doesn't need refills.   2. Let's move Cymbalta to night time- to try and help the sleepiness during the day.    3.  Continue Naltrexone 4 mg nightly- - last got filled (original Rx) 10/02/21. Call me when has 1 week supply left. Don't want to  decrease dose because 2 mg was not as effective.   4. F/U  3 months- chronic pain

## 2022-01-24 ENCOUNTER — Ambulatory Visit (INDEPENDENT_AMBULATORY_CARE_PROVIDER_SITE_OTHER): Payer: Medicare Other | Admitting: Physician Assistant

## 2022-01-24 ENCOUNTER — Encounter: Payer: Self-pay | Admitting: Physician Assistant

## 2022-01-24 VITALS — BP 103/65 | HR 63 | Resp 18 | Ht <= 58 in | Wt 112.0 lb

## 2022-01-24 DIAGNOSIS — F02A0 Dementia in other diseases classified elsewhere, mild, without behavioral disturbance, psychotic disturbance, mood disturbance, and anxiety: Secondary | ICD-10-CM | POA: Diagnosis not present

## 2022-01-24 DIAGNOSIS — G301 Alzheimer's disease with late onset: Secondary | ICD-10-CM | POA: Diagnosis not present

## 2022-01-24 NOTE — Patient Instructions (Signed)
    Control BP and increase water intake Continue atendance to the [facility]  Follow in 6 months

## 2022-01-24 NOTE — Progress Notes (Signed)
Assessment/Plan:   Dementia due to Alzheimer's Disease   [name] is a very pleasant 88 y.o. RH female  with a history ofwith a history of hypertension, hyperlipidemia, hypothyroidism, LBBB s/p ICD placement, breast cancer s/p mastectomy, chronic back pain, bipolar depression, anxiety,  mild dementia due to Alzheimer's disease seen today in follow up for memory loss. Patient was unable to tolerate antidementia medications . 07/2019 CT head personally reviewed was remarkable for  mild chronic microvascular disease and mild diffuse atrophy that have slightly progressed compared to 2016 imaging.She is stable form the cognitive standpoint, todays's MMSE is 30/30    Recommendations   Follow up in 6  months. Continue [facility] once weekly  No indication for antidementia medications due to side effects  Continue to control mood as per PCP Recommend good control of cardiovascular risk factors.       Subjective:    This patient is accompanied in the office by daughter who supplements the history.  Previous records as well as any outside records available were reviewed prior to todays visit. Last seen on 07/25/21   Any changes in memory since last visit? She has some "good and bad days, today a good one, comes and goes" . Sometimes she has trouble comprehending which can be frustrating to her. She may have a " a little more trouble with names or recent conversation, not a sharp change". She attends [facility] once a week "which seem to help a lot".  repeats oneself? "Not really" Disoriented when walking into a room?  Patient denies   Leaving objects in unusual places?  Patient denies   Ambulates  with difficulty?   Patient denies   Recent falls?  Patient denies   Any head injuries?  Patient denies   History of seizures?   Patient denies   Wandering behavior?  Patient denies   Patient drives?   Patient no longer drives  Any mood changes since last visit?  Patient denies    Any worsening depression?:  Patient denies   Hallucinations?  Patient denies   Paranoia?  Patient denies   Patient reports that does not sleep well  because she has to get up to urinate 2-3 times a night . Denies vivid dreams, REM behavior or sleepwalking . She is going to try melatonin  History of sleep apnea?  Patient denies   Any hygiene concerns?  Patient denies   Independent of bathing and dressing?  Endorsed  Does the patient needs help with medications? Patient  in charge  Who is in charge of the finances?  Daughter is in charge   Any changes in appetite?  Daughter is trying to make her to eat more, but patient reports tat eats when she is hungry  Patient have trouble swallowing? Patient denies   Does the patient cook? Endorsed, only when daughter home Any kitchen accidents such as leaving the stove on? Patient denies   Any headaches?  Patient denies   Double vision? Patient denies   Any focal numbness or tingling?  Patient denies   Chronic back pain Patient denies   Unilateral weakness?  Patient denies   Any tremors?  Patient denies   Any history of anosmia?  Patient denies   Any incontinence of urine? She has a pessary. Wakes up at night  Any bowel dysfunction?   Occasional constipation     Patient lives with: with daughter   History on Initial Assessment 10/13/2020: This is an 88 year old  right-handed woman with a history of hypertension, hyperlipidemia, hypothyroidism, LBBB s/p ICD placement, breast cancer s/p mastectomy, chronic back pain, bipolar depression, anxiety, presenting for evaluation of memory loss. She had been following with [facility] for memory loss since July 07, 2014, records were reviewed. She started noticing mild short-term memory changes in 07/07/2002 after lumbar surgery. Over the years, memory changes have progressed, her daughter moved in with her in 07/07/2019 and she stopped driving. She had side effects on Donepezil, Rivastigmine, and Memantine.    She states her  memory is "terrible." [name] reports memory changes became more noticeable after her husband passed away in 07-Jul-2018. She had left the stove on a couple of times and left keys outside the door. She would forget what she was going to say and forget conversations from the day prior. She would repeat herself at times. She manages her own medications, [name] checks behind her. Her husband was managing finances, [name] took over when he passed away. She stopped driving in Jan/Feb 5277 because she was having staggering gait. She is independent with dressing and bathing. Notes from [facility] indicate she was tried on Donepezil in 07/07/15, Memantine in 07-06-16, Rivastigmine in 06-Jul-2017. She felt "like a mummy, could not think."    She denies any headaches, diplopia, dysarthria/dysphagia, neck pain, focal numbness/tingling/weakness, anosmia. She has chronic back pain and constipation. She has occasional dizziness. There are tremors in both hands affecting handwriting. Sleep is okay, she usually sleeps 8 hours at night. Her last fall was in January 2022 when she fell off the bed. She had worked with physical therapy and staggering gait has stabilized. Mood is "sometimes stable," she is more irritable with herself when she could not remember things. [name] notices she is more anxious when she has to leave. No hallucinations or paranoia. No family history of dementia, history of significant head injuries, or alcohol use.   Laboratory Data:      Lab Results  Component Value Date    TSH 0.67 11/17/2020         Lab Results  Component Value Date    VITAMINB12 1,117 (H) 07/28/2014      I personally reviewed head CT without contrast done 07/2019 which did not show any acute changes. There was mild chronic microvascular disease and mild diffuse atrophy that have slightly progressed compared to 07-Jul-2014 imaging.   PREVIOUS MEDICATIONS: rivastigmine, donepezil, memantine  CURRENT MEDICATIONS:  Outpatient Encounter Medications as of  01/24/2022  Medication Sig   acetaminophen (TYLENOL) 500 MG tablet Take 500 mg by mouth in the morning and at bedtime. 500 mg in the morning and afternoon   acetaminophen (TYLENOL) 650 MG CR tablet Take 650 mg by mouth at bedtime.   albuterol (VENTOLIN HFA) 108 (90 Base) MCG/ACT inhaler Inhale 2 puffs into the lungs every 6 (six) hours as needed for wheezing or shortness of breath.   ascorbic acid (VITAMIN C) 500 MG tablet Take 500 mg by mouth daily.   buPROPion (WELLBUTRIN XL) 150 MG 24 hr tablet Take 1 tablet (150 mg total) by mouth daily.   carvedilol (COREG) 3.125 MG tablet TAKE 1 TABLET BY MOUTH  TWICE DAILY   Cholecalciferol (VITAMIN D3) 50 MCG (2000 UT) TABS Take 2,000 Units by mouth daily.    diclofenac Sodium (VOLTAREN) 1 % GEL Apply 4 g topically 4 (four) times daily.   DULoxetine (CYMBALTA) 60 MG capsule Take 60 mg by mouth daily.   famotidine (PEPCID) 10 MG tablet Take 10 mg by mouth as  needed for heartburn or indigestion.   fluticasone (FLONASE) 50 MCG/ACT nasal spray Place 2 sprays into both nostrils daily.   furosemide (LASIX) 20 MG tablet Take 1 tablet (20 mg total) by mouth daily as needed.   levothyroxine (SYNTHROID, LEVOTHROID) 100 MCG tablet Take 1 tablet (100 mcg total) by mouth daily before breakfast.   Lidocaine 4 % PTCH Apply 1 patch topically every 12 (twelve) hours. Apply to back   lubiprostone (AMITIZA) 8 MCG capsule Take 1 capsule (8 mcg total) by mouth 2 (two) times daily with a meal.   Magnesium 250 MG TABS Take 250 mg by mouth daily.   meclizine (ANTIVERT) 12.5 MG tablet Take 1 tablet (12.5 mg total) by mouth 3 (three) times daily as needed for dizziness.   mirabegron ER (MYRBETRIQ) 50 MG TB24 tablet Take 1 tablet (50 mg total) by mouth daily.   NALTREXONE HCL PO Take 4 mg by mouth at bedtime.   NONFORMULARY OR COMPOUNDED ITEM Apply 120 Tubes topically daily. Diclofenac/Cyclobenzaprine/lamotrigine/lidocaine/prilocaine (10480) 2%/2%/6%/5%/1.25% cream QTY: 120  GM SIG: (NEURO) APPLY 1-2 PUMPS (1-2 GMS) TO AFFECTED AREA (S) OR FOCAL POINTS 3 TO 4 TIMES DAILY. RUB IN FOR 2 MINUTES TO ACHIEVE MAX PENETRATION. [place] HANDS WELL.   Omega-3 Fatty Acids (FISH OIL PO) Take 1 capsule by mouth daily.   omeprazole (PRILOSEC) 40 MG capsule TAKE 1 CAPSULE BY MOUTH DAILY   polyethylene glycol powder (GLYCOLAX/MIRALAX) 17 GM/SCOOP powder Take 1 Container by mouth daily.   pregabalin (LYRICA) 25 MG capsule Take 1 capsule (25 mg total) by mouth 3 (three) times daily. 1 in AM and 2 at night- for back/nerve pain   senna-docusate (SENOKOT S) 8.6-50 MG tablet Take 2 tablets by mouth daily.   sodium phosphate (FLEET) 7-19 GM/118ML ENEM Place 133 mLs (1 enema total) rectally daily as needed for severe constipation.   spironolactone (ALDACTONE) 25 MG tablet TAKE 1 TABLET BY MOUTH IN  THE MORNING   telmisartan (MICARDIS) 20 MG tablet Take 1 tablet (20 mg total) by mouth daily.   Vitamin E 400 units TABS Take 400 Units by mouth daily.    No facility-administered encounter medications on file as of 01/24/2022.       01/24/2022    1:00 PM 01/28/2020   10:09 AM 07/23/2019   11:08 AM  MMSE - Mini Mental State Exam  Orientation to time '5 5 4  '$ Orientation to Place '5 5 5  '$ Registration '3 3 3  '$ Attention/ Calculation 5 0 4  Recall '3 3 2  '$ Language- name 2 objects '2 2 2  '$ Language- repeat 1 0 1  Language- follow 3 step command '3 3 3  '$ Language- read & follow direction '1 1 1  '$ Write a sentence '1 1 1  '$ Copy design '1 1 1  '$ Copy design-comments   13 animals  Total score '30 24 27      '$ 10/13/2020   10:00 AM  Montreal Cognitive Assessment   Visuospatial/ Executive (0/5) 2  Naming (0/3) 3  Attention: Read list of digits (0/2) 2  Attention: Read list of letters (0/1) 1  Attention: Serial 7 subtraction starting at 100 (0/3) 2  Language: Repeat phrase (0/2) 1  Language : Fluency (0/1) 1  Abstraction (0/2) 1  Delayed Recall (0/5) 2  Orientation (0/6) 6  Total 21  Adjusted Score  (based on education) 21    Objective:     PHYSICAL EXAMINATION:    VITALS:   Vitals:   01/24/22 1302  BP:  103/65  Pulse: 63  Resp: 18  SpO2: 98%  Weight: 112 lb (50.8 kg)  Height: '4\' 10"'$  (1.473 m)    GEN:  The patient appears stated age and is in NAD. HEENT:  Normocephalic, atraumatic.   Neurological examination:  General: NAD, well-groomed, appears stated age. Orientation: The patient is alert. Oriented to person, place and date Cranial nerves: There is good facial symmetry.The speech is fluent and clear. No aphasia or dysarthria. Fund of knowledge is appropriate. Recent and remote memory are normal, Attention and concentration are normal.  Able to name objects and repeat phrases.  Hearing is intact to conversational tone.    Sensation: Sensation is intact to light touch throughout Motor: Strength is at least antigravity x4. Tremors: none  DTR's 2/4 in UE/LE     Movement examination: Tone: There is normal tone in the UE/LE Abnormal movements:  no tremor.  No myoclonus.  No asterixis.   Coordination:  There is no decremation with RAM's. Normal finger to nose  Gait and Station: The patient has no difficulty arising out of a deep-seated chair without the use of the hands. The patient's stride length is good.  Gait is cautious and narrow.    Thank you for allowing [place] the opportunity to participate in the care of this nice patient. Please do not hesitate to contact us for any questions or concerns.   Total time spent on today's visit was 24 minutes dedicated to this patient today, preparing to see patient, examining the patient, ordering tests and/or medications and counseling the patient, documenting clinical information in the EHR or other health record, independently interpreting results and communicating results to the patient/family, discussing treatment and goals, answering patient's questions and coordinating care.  Cc:  [name], NP  Sharene Butters 01/24/2022 6:16 PM

## 2022-02-01 ENCOUNTER — Ambulatory Visit (INDEPENDENT_AMBULATORY_CARE_PROVIDER_SITE_OTHER): Payer: Medicare Other

## 2022-02-01 DIAGNOSIS — I428 Other cardiomyopathies: Secondary | ICD-10-CM | POA: Diagnosis not present

## 2022-02-01 LAB — CUP PACEART REMOTE DEVICE CHECK
Battery Remaining Longevity: 18 mo
Battery Remaining Percentage: 27 %
Brady Statistic RA Percent Paced: 0 %
Brady Statistic RV Percent Paced: 100 %
Date Time Interrogation Session: 20231026074300
HighPow Impedance: 50 Ohm
Implantable Lead Connection Status: 753985
Implantable Lead Connection Status: 753985
Implantable Lead Connection Status: 753985
Implantable Lead Implant Date: 20080116
Implantable Lead Implant Date: 20080116
Implantable Lead Implant Date: 20080116
Implantable Lead Location: 753859
Implantable Lead Location: 753860
Implantable Lead Location: 753860
Implantable Lead Model: 157
Implantable Lead Model: 4469
Implantable Lead Model: 4555
Implantable Lead Serial Number: 136532
Implantable Lead Serial Number: 161542
Implantable Lead Serial Number: 473495
Implantable Pulse Generator Implant Date: 20140911
Lead Channel Impedance Value: 415 Ohm
Lead Channel Impedance Value: 523 Ohm
Lead Channel Impedance Value: 888 Ohm
Lead Channel Pacing Threshold Amplitude: 0.6 V
Lead Channel Pacing Threshold Amplitude: 0.7 V
Lead Channel Pacing Threshold Amplitude: 1.1 V
Lead Channel Pacing Threshold Pulse Width: 0.4 ms
Lead Channel Pacing Threshold Pulse Width: 0.4 ms
Lead Channel Pacing Threshold Pulse Width: 0.8 ms
Lead Channel Setting Pacing Amplitude: 2 V
Lead Channel Setting Pacing Amplitude: 2 V
Lead Channel Setting Pacing Amplitude: 2.4 V
Lead Channel Setting Pacing Pulse Width: 0.4 ms
Lead Channel Setting Pacing Pulse Width: 0.8 ms
Lead Channel Setting Sensing Sensitivity: 0.6 mV
Lead Channel Setting Sensing Sensitivity: 1 mV
Pulse Gen Serial Number: 111765

## 2022-02-05 ENCOUNTER — Encounter: Payer: Self-pay | Admitting: Internal Medicine

## 2022-02-05 ENCOUNTER — Ambulatory Visit: Payer: Medicare Other | Attending: Internal Medicine | Admitting: Internal Medicine

## 2022-02-05 VITALS — BP 120/72 | HR 67 | Ht <= 58 in | Wt 112.0 lb

## 2022-02-05 DIAGNOSIS — Z95810 Presence of automatic (implantable) cardiac defibrillator: Secondary | ICD-10-CM | POA: Diagnosis not present

## 2022-02-05 DIAGNOSIS — R42 Dizziness and giddiness: Secondary | ICD-10-CM | POA: Diagnosis not present

## 2022-02-05 DIAGNOSIS — I5022 Chronic systolic (congestive) heart failure: Secondary | ICD-10-CM

## 2022-02-05 LAB — CUP PACEART INCLINIC DEVICE CHECK
Date Time Interrogation Session: 20231030170935
Implantable Lead Connection Status: 753985
Implantable Lead Connection Status: 753985
Implantable Lead Connection Status: 753985
Implantable Lead Implant Date: 20080116
Implantable Lead Implant Date: 20080116
Implantable Lead Implant Date: 20080116
Implantable Lead Location: 753859
Implantable Lead Location: 753860
Implantable Lead Location: 753860
Implantable Lead Model: 157
Implantable Lead Model: 4469
Implantable Lead Model: 4555
Implantable Lead Serial Number: 136532
Implantable Lead Serial Number: 161542
Implantable Lead Serial Number: 473495
Implantable Pulse Generator Implant Date: 20140911
Pulse Gen Serial Number: 111765

## 2022-02-05 NOTE — Progress Notes (Signed)
HPI [name] returns today for followup. She is a pleasant 88 yo woman with a h/o chronic systolic heart failure, LBBB, s/p biv ICD insertion. She notes some weight loss and dyspnea though she has become more sedentary. She does ok on flat ground. No ICD shocks. She has been diagnosed with breast CA and undergone mastectomy. She has done well so far after breast surgery. She is still a little sore. she gets a little dizzy when she gets up in the morning. her weight is down about 10 lbs in 18 months. Allergies  Allergen Reactions   Iodine Shortness Of Breath    Iodine contrast, CHF , SOB   Shellfish Allergy Shortness Of Breath   Memantine     Malaise, fogginess/ couldn't think   Aspirin Nausea And Vomiting   Codeine Nausea And Vomiting     Current Outpatient Medications  Medication Sig Dispense Refill   acetaminophen (TYLENOL) 500 MG tablet Take 500 mg by mouth in the morning and at bedtime. 500 mg in the morning and afternoon     acetaminophen (TYLENOL) 650 MG CR tablet Take 650 mg by mouth at bedtime.     albuterol (VENTOLIN HFA) 108 (90 Base) MCG/ACT inhaler Inhale 2 puffs into the lungs every 6 (six) hours as needed for wheezing or shortness of breath. 8 g 6   ascorbic acid (VITAMIN C) 500 MG tablet Take 500 mg by mouth daily.     buPROPion (WELLBUTRIN XL) 150 MG 24 hr tablet Take 1 tablet (150 mg total) by mouth daily.     carvedilol (COREG) 3.125 MG tablet TAKE 1 TABLET BY MOUTH  TWICE DAILY 180 tablet 3   Cholecalciferol (VITAMIN D3) 50 MCG (2000 UT) TABS Take 2,000 Units by mouth daily.      diclofenac Sodium (VOLTAREN) 1 % GEL Apply 4 g topically 4 (four) times daily. 100 g 2   DULoxetine (CYMBALTA) 60 MG capsule Take 60 mg by mouth daily.     famotidine (PEPCID) 10 MG tablet Take 10 mg by mouth as needed for heartburn or indigestion.     fluticasone (FLONASE) 50 MCG/ACT nasal spray Place 2 sprays into both nostrils daily. 16 g 3   furosemide (LASIX) 20 MG tablet Take 1  tablet (20 mg total) by mouth daily as needed. 90 tablet 3   levothyroxine (SYNTHROID, LEVOTHROID) 100 MCG tablet Take 1 tablet (100 mcg total) by mouth daily before breakfast. 90 tablet 3   Lidocaine 4 % PTCH Apply 1 patch topically every 12 (twelve) hours. Apply to back     lubiprostone (AMITIZA) 8 MCG capsule Take 1 capsule (8 mcg total) by mouth 2 (two) times daily with a meal. 60 capsule 1   Magnesium 250 MG TABS Take 250 mg by mouth daily.     meclizine (ANTIVERT) 12.5 MG tablet Take 1 tablet (12.5 mg total) by mouth 3 (three) times daily as needed for dizziness. 30 tablet 1   mirabegron ER (MYRBETRIQ) 50 MG TB24 tablet Take 1 tablet (50 mg total) by mouth daily. 30 tablet 11   NALTREXONE HCL PO Take 4 mg by mouth at bedtime.     NONFORMULARY OR COMPOUNDED ITEM Apply 120 Tubes topically daily. Diclofenac/Cyclobenzaprine/lamotrigine/lidocaine/prilocaine (10480) 2%/2%/6%/5%/1.25% cream QTY: 120 GM SIG: (NEURO) APPLY 1-2 PUMPS (1-2 GMS) TO AFFECTED AREA (S) OR FOCAL POINTS 3 TO 4 TIMES DAILY. RUB IN FOR 2 MINUTES TO ACHIEVE MAX PENETRATION. [place] HANDS WELL. 1 each 2   Omega-3 Fatty  Acids (FISH OIL PO) Take 1 capsule by mouth daily.     omeprazole (PRILOSEC) 40 MG capsule TAKE 1 CAPSULE BY MOUTH DAILY 90 capsule 3   polyethylene glycol powder (GLYCOLAX/MIRALAX) 17 GM/SCOOP powder Take 1 Container by mouth daily.     pregabalin (LYRICA) 25 MG capsule Take 1 capsule (25 mg total) by mouth 3 (three) times daily. 1 in AM and 2 at night- for back/nerve pain 90 capsule 5   senna-docusate (SENOKOT S) 8.6-50 MG tablet Take 2 tablets by mouth daily. 60 tablet 5   sodium phosphate (FLEET) 7-19 GM/118ML ENEM Place 133 mLs (1 enema total) rectally daily as needed for severe constipation. 266 mL 0   spironolactone (ALDACTONE) 25 MG tablet TAKE 1 TABLET BY MOUTH IN  THE MORNING 90 tablet 2   telmisartan (MICARDIS) 20 MG tablet Take 1 tablet (20 mg total) by mouth daily. 90 tablet 0   Vitamin E 400 units TABS  Take 400 Units by mouth daily.      No current facility-administered medications for this visit.     Past Medical History:  Diagnosis Date   Arthritis    Cancer ([place])    left breast cancer    Cataracts, bilateral    removed by surgery   CHF (congestive heart failure) ([place])    PACEMAKER & DEFIB   Complication of anesthesia    hypotensive after back surgery in 2006   Depression    Dyslipidemia    Fainted 04/21/06   AT [facility]   GERD (gastroesophageal reflux disease)    Headache(784.0)    Hearing loss    bilateral hearing aids   HLD (hyperlipidemia)    diet controlled    Hypertension    Hypothyroidism    ICD (implantable cardiac defibrillator) in place    pt has pacer/icd   ICD (implantable cardiac defibrillator), biventricular, in situ    LBBB (left bundle branch block)    Memory loss    Nonischemic cardiomyopathy ([place])    Normal coronary arteries    s/p cardiac cath 2007   Pacemaker    ICD [employer]   Syncope    Systolic CHF [facility])    Vertigo    Wears glasses     ROS:   All systems reviewed and negative except as noted in the HPI.   Past Surgical History:  Procedure Laterality Date   BACK SURGERY     lumbar fusion    BREAST LUMPECTOMY Left 2008   BREAST SURGERY  2000   LUMP REMOVAL. STAGE 1 CANCER   CARDIAC CATHETERIZATION     CATARACT EXTRACTION     COLONOSCOPY     EYE SURGERY     IMPLANTABLE CARDIOVERTER DEFIBRILLATOR GENERATOR CHANGE N/A 12/18/2012   Procedure: IMPLANTABLE CARDIOVERTER DEFIBRILLATOR GENERATOR CHANGE;  Surgeon: [name], MD;  Location: [facility] CATH LAB;  Service: Cardiovascular;  Laterality: N/A;   JOINT REPLACEMENT  06/14/01   right   LUMBAR FUSION  2006   MASS EXCISION  11/08/2011   Procedure: EXCISION MASS;  Surgeon: [name], MD;  Location: [facility] ORS;  Service: General;  Laterality: Left;  Excision Left Thigh Mass   MASTECTOMY W/ SENTINEL NODE BIOPSY Left 06/04/2019   Procedure: LEFT MASTECTOMY WITH SENTINEL LYMPH NODE  BIOPSY;  Surgeon: [name], MD;  Location: [facility];  Service: General;  Laterality: Left;   MASTECTOMY, PARTIAL  2008   GOT PACEMAKER AND DEFIB AT THAT TIME   PACEMAKER INSERTION  04/23/06   TOTAL  KNEE ARTHROPLASTY  05/17/01   RIGHT KNEE   TOTAL KNEE ARTHROPLASTY Left 11/29/2014   Procedure: TOTAL LEFT KNEE ARTHROPLASTY;  Surgeon: Paralee Cancel, MD;  Location: [facility] OR[facility];  Service: Orthopedics;  Laterality: Left;     Family History  Problem Relation Age of Onset   Hypertension Mother    Arthritis Mother    Hypertension Father    Hypertension Brother    Hypertension Brother      Social History   Socioeconomic History   Marital status: Married    Spouse name: Not on file   Number of children: 1   Years of education: Masters   Highest education level: Not on file  Occupational History   Occupation: Retired  Tobacco Use   Smoking status: Never    Passive exposure: Never   Smokeless tobacco: Never  Vaping Use   Vaping Use: Never used  Substance and Sexual Activity   Alcohol use: No   Drug use: No   Sexual activity: Not Currently    Birth control/protection: Post-menopausal  Other Topics Concern   Not on file  Social History Narrative   Lives at home with husband.   Right-handed.      As of 07/28/2014   Diet: No special diet   Caffeine: yes, Chocolate, tea and sodas    Married: YES, 1970   House: Yes, 2 stories, 2-3 persons live in home   Pets: No   Current/Past profession: Engineer, mining, Designer, jewellery    Exercise: Yes 2-3 x weekly   Living Will: Yes   DNR: No   POA/HPOA: No      Social Determinants of Health   Financial Resource Strain: Low Risk  (03/27/2017)   Overall Financial Resource Strain (CARDIA)    Difficulty of Paying Living Expenses: Not hard at all  Food Insecurity: No Food Insecurity (03/27/2017)   Hunger Vital Sign    Worried About Running Out of Food in the Last Year: Never true    [place] in the Last Year: Never true   Transportation Needs: No Transportation Needs (03/27/2017)   PRAPARE - Hydrologist (Medical): No    Lack of Transportation (Non-Medical): No  Physical Activity: Insufficiently Active (03/27/2017)   Exercise Vital Sign    Days of Exercise per Week: 4 days    Minutes of Exercise per Session: 30 min  Stress: Stress Concern Present (03/27/2017)   [place]    Feeling of Stress : To some extent  Social Connections: Socially Integrated (03/27/2017)   Social Connection and Isolation Panel [NHANES]    Frequency of Communication with Friends and Family: More than three times a week    Frequency of Social Gatherings with Friends and Family: Once a week    Attends Religious Services: More than 4 times per year    Active Member of [employer] or Organizations: Yes    Attends Archivist Meetings: Never    Marital Status: Married  Human resources officer Violence: Not At Risk (03/27/2017)   Humiliation, Afraid, Rape, and Kick questionnaire    Fear of Current or Ex-Partner: No    Emotionally Abused: No    Physically Abused: No    Sexually Abused: No     BP 120/72   Pulse 67   Ht '4\' 10"'$  (1.473 m)   Wt 112 lb (50.8 kg)   LMP  (LMP Unknown)   BMI 23.41 kg/m   Physical  Exam:  Well appearing NAD HEENT: Unremarkable Neck:  No JVD, no thyromegally Lymphatics:  No adenopathy Back:  No CVA tenderness Lungs:  Clear HEART:  Regular rate rhythm, no murmurs, no rubs, no clicks Abd:  soft, positive bowel sounds, no organomegally, no rebound, no guarding Ext:  2 plus pulses, no edema, no cyanosis, no clubbing Skin:  No rashes no nodules Neuro:  CN II through XII intact, motor grossly intact  EKG NSR with biv pacing  DEVICE  Normal device function.  See PaceArt for details.   Assess/Plan:  1. Chronic systolic heart failure - her symptoms remain class 2. She will continue her current meds. 2. ICD  - her boston Sci DDD biv ICD is working normally. We will recheck in several months. 3. HTN - her bp is controlled. No change in meds. 4. Weight loss - we discussed the importance of keeping her weight up. I asked her to concentrate on higher calorie foods. She is not able to eat dairy due to problems with hypercalcemia.   Carleene Overlie Lavergne Hiltunen,MD

## 2022-02-05 NOTE — Patient Instructions (Signed)
Medication Instructions:  Your physician recommends that you continue on your current medications as directed. Please refer to the Current Medication list given to you today.  *If you need a refill on your cardiac medications before your next appointment, please call your pharmacy*  Lab Work: None ordered.  If you have labs (blood work) drawn today and your tests are completely normal, you will receive your results only by: [place] (if you have MyChart) OR A paper copy in the mail If you have any lab test that is abnormal or we need to change your treatment, we will call you to review the results.  Testing/Procedures: None ordered.  Follow-Up: At [facility], [place], you and your health needs are our priority.  As part of our continuing mission to provide you with exceptional heart care, we have created designated Provider Care Teams.  These Care Teams include your primary Cardiologist (physician) and Advanced Practice Providers (APPs -  Physician Assistants and Nurse Practitioners) who all work together to provide you with the care you need, when you need it.  We recommend signing up for the patient portal called "MyChart".  Sign up information is provided on this After Visit Summary.  MyChart is used to connect with patients for Virtual Visits (Telemedicine).  Patients are able to view lab/test results, encounter notes, upcoming appointments, etc.  Non-urgent messages can be sent to your provider as well.   To learn more about what you can do with MyChart, go to [email or web address].    Your next appointment:   1 year(s)  The format for your next appointment:   In Person  Provider:   Cristopher Peru, MD{or one of the following Advanced Practice Providers on your designated Care Team:   Tommye Standard, [place] Legrand Como "Jonni Sanger" Chalmers Cater, [place]  Remote monitoring is used to monitor your ICD from home. This monitoring reduces the number of office visits required to check your device to one  time per year. It allows [place] to keep an eye on the functioning of your device to ensure it is working properly. You are scheduled for a device check from home on 05/03/22. You may send your transmission at any time that day. If you have a wireless device, the transmission will be sent automatically. After your physician reviews your transmission, you will receive a postcard with your next transmission date.  Important Information About Sugar

## 2022-02-09 ENCOUNTER — Other Ambulatory Visit: Payer: Self-pay | Admitting: Nurse Practitioner

## 2022-02-09 DIAGNOSIS — K5904 Chronic idiopathic constipation: Secondary | ICD-10-CM

## 2022-02-09 NOTE — Progress Notes (Signed)
Remote ICD transmission.   

## 2022-02-20 ENCOUNTER — Telehealth: Payer: Self-pay

## 2022-02-20 NOTE — Telephone Encounter (Signed)
[facility] sent over a request for Naltrexone 4 MG Caps. Please advise or send thank you.

## 2022-02-22 NOTE — Therapy (Addendum)
OUTPATIENT PHYSICAL THERAPY FEMALE PELVIC EVALUATION   Patient Name: [name] MRN: [id number] DOB:23-Jan-1934, 88 y.o., female Today's Date: 02/22/2022  END OF SESSION:  PT Visits / Re-Eval11/16/2023   Visit Number 1  Number of Visits --  Date for PT Re-Evaluation 05/18/2022  Authorization   Authorization Type [employer]  Authorization Time Period --  Authorization - Visit Number 1  Authorization - Number of Visits 10  Progress Note Due on Visit --  PT Time Calculation   PT Start Time 1100  PT Stop Time 1140  PT Time Calculation (min) 40 min  PT - End of Session   Equipment Utilized During Treatment --  Activity Tolerance Patient tolerated treatment well  Behavior During Therapy [facility] for tasks assessed/performed  Earlie Counts, PT 03/08/22 8:30 AM   Past Medical History:  Diagnosis Date   Arthritis    Cancer ([employer])    left breast cancer    Cataracts, bilateral    removed by surgery   CHF (congestive heart failure) ([place])    PACEMAKER & DEFIB   Complication of anesthesia    hypotensive after back surgery in 2006   Depression    Dyslipidemia    Fainted 04/21/06   AT [facility]   GERD (gastroesophageal reflux disease)    Headache(784.0)    Hearing loss    bilateral hearing aids   HLD (hyperlipidemia)    diet controlled    Hypertension    Hypothyroidism    ICD (implantable cardiac defibrillator) in place    pt has pacer/icd   ICD (implantable cardiac defibrillator), biventricular, in situ    LBBB (left bundle branch block)    Memory loss    Nonischemic cardiomyopathy ([place])    Normal coronary arteries    s/p cardiac cath 2007   Pacemaker    ICD [employer]   Syncope    Systolic CHF [facility])    Vertigo    Wears glasses    Past Surgical History:  Procedure Laterality Date   BACK SURGERY     lumbar fusion    BREAST LUMPECTOMY Left 2008   BREAST SURGERY  2000   LUMP REMOVAL. STAGE 1 CANCER   CARDIAC CATHETERIZATION     CATARACT EXTRACTION      COLONOSCOPY     EYE SURGERY     IMPLANTABLE CARDIOVERTER DEFIBRILLATOR GENERATOR CHANGE N/A 12/18/2012   Procedure: IMPLANTABLE CARDIOVERTER DEFIBRILLATOR GENERATOR CHANGE;  Surgeon: [name], MD;  Location: [facility] CATH LAB;  Service: Cardiovascular;  Laterality: N/A;   JOINT REPLACEMENT  06/14/01   right   LUMBAR FUSION  2006   MASS EXCISION  11/08/2011   Procedure: EXCISION MASS;  Surgeon: [name], MD;  Location: [facility] ORS;  Service: General;  Laterality: Left;  Excision Left Thigh Mass   MASTECTOMY W/ SENTINEL NODE BIOPSY Left 06/04/2019   Procedure: LEFT MASTECTOMY WITH SENTINEL LYMPH NODE BIOPSY;  Surgeon: [name], MD;  Location: [place];  Service: General;  Laterality: Left;   MASTECTOMY, PARTIAL  2008   GOT PACEMAKER AND DEFIB AT THAT TIME   PACEMAKER INSERTION  04/23/06   TOTAL KNEE ARTHROPLASTY  05/17/01   RIGHT KNEE   TOTAL KNEE ARTHROPLASTY Left 11/29/2014   Procedure: TOTAL LEFT KNEE ARTHROPLASTY;  Surgeon: Paralee Cancel, MD;  Location: [facility];  Service: Orthopedics;  Laterality: Left;   Patient Active Problem List   Diagnosis Date Noted   Sciatica associated with disorder of multiple sites of spine 03/20/2021   Bipolar II disorder (  [place]) 03/20/2021   Chronic pain syndrome 03/20/2021   Dementia without behavioral disturbance ([place]) 02/04/2020   Urinary retention 02/04/2020   Seasonal allergies 02/04/2020   Recurrent major depressive disorder, in partial remission ([place]) 07/17/2019   Status post left mastectomy 07/01/2019   Breast cancer of lower-outer quadrant of left female breast ([place]) 06/04/2019   Candida infection, oral 01/15/2019   Senile purpura ([place]) 04/14/2018   Lumbar post-laminectomy syndrome 12/03/2017   Lumbar spondylosis 12/03/2017   History of back surgery 09/01/2017   Constipation 09/01/2017   Hypothyroidism due to acquired atrophy of thyroid 09/01/2017   Mixed hyperlipidemia 09/01/2017   Age-related osteoporosis without current pathological fracture  09/01/2017   High risk medication use 09/01/2017   Arthritis of hand 05/17/2017   Atherosclerosis of native arteries of extremity with intermittent claudication ([place]) 05/15/2017   Status post total bilateral knee replacement 12/20/2016   Gait abnormality 07/16/2016   Chronic low back pain 07/16/2016   Mild cognitive impairment 07/16/2016   Wrist pain 05/10/2015   S/P left TKA 11/29/2014   S/P knee replacement 11/29/2014   Spontaneous bruising 08/27/2014   Numbness and tingling in right hand 07/28/2014   Essential tremor 07/28/2014   Dizziness 05/28/2014   Shaky 05/28/2014   Memory loss 05/28/2014   Depression 05/06/2014   Lipoma of left upper thigh 3x5 cm 09/28/2011   Cerebral vascular accident ([place]) 08/09/2011   Syncope 08/09/2011   History of breast cancer [id number], s/p BCT 2008, triple negative 01/26/2011   Chronic L breast pain with chronic recurrent seroma, s/p excisional biopsy 01/12/2010 01/26/2011   Fainted    ICD (implantable cardioverter-defibrillator), biventricular, in situ 31/49/7026   Chronic systolic heart failure ([place]) 08/02/2010   Essential hypertension 08/02/2010    PCP: [name], NP  REFERRING PROVIDER: [name], MD   REFERRING DIAG: N39.45 (ICD-10-CM) - Continuous leakage  THERAPY DIAG:  No diagnosis found.  Rationale for Evaluation and Treatment: Rehabilitation  ONSET DATE: 6 months ago  SUBJECTIVE:                                                                                                                                                                                          Patient daughter present during the initial evaluation.  SUBJECTIVE STATEMENT: Patient has a pessary in. Patient has had a pessary since January with several trials to have one fit her correctly. She has one that is staying in and feels chafing. MD prescribed Estradial.  Fluid intake: Yes: water and 2 glasses of ginger tea    PAIN:  Are you having pain?  No  PRECAUTIONS: ICD/Pacemaker, left breast cancer  WEIGHT BEARING RESTRICTIONS: No  FALLS:  Has patient fallen in last 6 months? No  LIVING ENVIRONMENT: Lives with: lives with their family   OCCUPATION: retired  PLOF: Independent  PATIENT GOALS: reduce leakage  PERTINENT HISTORY:  Left breast cancer; CHF; Hypothyroidism; Pacemaker; Lumbar fusion; Left knee arthroplasty   URINATION: Pain with urination: No Fully empty bladder: Yes: sometimes, after she is done urinating she feels like there is still some urine remaining Stream: Strong, sometimes has a trickling flow Urgency: Yes:   Frequency: day time voids 4 night times every 2 hours or 1 hour Leakage:  does not feel the leakage Pads: Yes: 4 pads per day, night wears a depends with toilet paper  PREGNANCY: Vaginal deliveries 1   PROLAPSE: Cystocele   and Urethrocele     OBJECTIVE:   DIAGNOSTIC FINDINGS:  Pessary fittings   COGNITION: Overall cognitive status: Within functional limits for tasks assessed     SENSATION: Light touch: Appears intact Proprioception: Appears intact   FUNCTIONAL TESTS:  5 times sit to stand: 20 sec Timed up and go (TUG): 15 sec  GAIT: Assistive device utilized: Single point cane, when she is going out Level of assistance: Complete Independence Comments: small steps and slowly   POSTURE: No Significant postural limitations  PELVIC ALIGNMENT:   LOWER EXTREMITY ROM:  Passive ROM Right eval Left eval  Hip external rotation 25 45   (Blank rows = not tested)  LOWER EXTREMITY MMT:  MMT Right eval Left eval  Hip extension 3/5 3/5  Hip abduction 3/5 3/5  Hip adduction 3/5 3/5   PALPATION:    External Perineal Exam some redness in the vulva area                             Internal Pelvic Floor tightness in the introitus. She would contract her gluteal more than the vaginal but after tactile cues was able to isolate the vaginal canal.   Patient confirms  identification and approves PT to assess internal pelvic floor and treatment Yes  PELVIC MMT:   MMT eval  Vaginal 1/5        TONE: Low tone  PROLAPSE: Had pessary in  TODAY'S TREATMENT:                                                                                                                              DATE: 02/23/2022  EVAL See below   PATIENT EDUCATION:  02/23/2022 Education details: Access Code: [email or web address] Person educated: Patient Education method: Consulting civil engineer, Demonstration, Corporate treasurer cues, Verbal cues, and Handouts Education comprehension: verbalized understanding, returned demonstration, verbal cues required, tactile cues required, and needs further education  HOME EXERCISE PROGRAM: 02/23/2022 Access Code: [email or web address] URL: [email or web address] Date: 02/23/2022 Prepared by: Earlie Counts  Exercises - Supine Pelvic Floor Contraction  - 3 x daily - 7 x weekly - 1 sets - 10 reps - 5 sec hold  ASSESSMENT:  CLINICAL IMPRESSION: Patient is a 88 y.o. female who was seen today for physical therapy evaluation and treatment for continuous leakage. Patient has continuous leakage without her feeling she is leaking. Patient wears 4 pad per day and 1 depends at night. Patient is wearing a pessary that is fitting but is causing some discomfort. She is using Estradial in the vaginal canal. Pelvic floor strength is 1/5 and will use the gluteal to contract the pelvic floor but with tactile cues is able to isolate. Time up and go test was 15 sec and sit to stand is 20 sec putting patient at risk of falls. Patient will benefit from skilled therapy to improve pelvic floor strength to reduce leakage and improve her balance to walk to the commode safely.   OBJECTIVE IMPAIRMENTS: decreased activity tolerance, decreased coordination, decreased endurance, decreased mobility, difficulty walking, decreased strength, and increased fascial restrictions.   ACTIVITY LIMITATIONS: standing,  transfers, continence, toileting, and locomotion level  PARTICIPATION LIMITATIONS: shopping and community activity  PERSONAL FACTORS: Age, Fitness, Past/current experiences, and 1-2 comorbidities: Left breast cancer; CHF; Hypothyroidism; Pacemaker; Lumbar fusion; Left knee arthroplasty  are also affecting patient's functional outcome.   REHAB POTENTIAL: Good  CLINICAL DECISION MAKING: Evolving/moderate complexity  EVALUATION COMPLEXITY: Moderate   GOALS: Goals reviewed with patient? Yes  SHORT TERM GOALS: Target date: 03/23/2022  Patient independent with initial HEP for pelvic floor contraction.  Baseline: Goal status: INITIAL  2.  Patient is able to feel the urinary leakage due to improve pelvic floor tone.  Baseline:  Goal status: INITIAL  3.  Patient educated on the urge to void to be able to walk to the bathroom slowl Baseline:  Goal status: INITIAL  4.  Timed up and go </= 14 sec Baseline:  Goal status: INITIAL  5.  Sit to stand </- 18 sec  Baseline:  Goal status: INITIAL   LONG TERM GOALS: Target date: 05/18/2021  Patient independent with advanced hep for pelvic floor strength and balance exercises.  Baseline:  Goal status: INITIAL  2.  Patient reports her urinary leakage is </= 50% due to improved pelvic floor strength >/ 3/5 Baseline:  Goal status: INITIAL  3.  Time up and go test is </= 13 sec. So she is able to walk to the commode safely when she has the urge to urinate.  Baseline:  Goal status: INITIAL  4.  Sit to stand score is </= 15 sec so she is able to safely get out of the chair or get up from the commode.  Baseline:  Goal status: INITIAL  5.  Patient is able to wake up 2 times at night instead of every hour due to increased in pelvic floor strength and understanding on how to use the urge to void behavioral technique.  Baseline:  Goal status: INITIAL   PLAN:  PT FREQUENCY: 1-2x/week  PT DURATION: 12 weeks  PLANNED INTERVENTIONS:  Therapeutic exercises, Therapeutic activity, Neuromuscular re-education, Balance training, Gait training, Patient/Family education, Self Care, Biofeedback, and Manual therapy  PLAN FOR NEXT SESSION: work on balance activity, hip strength, and walking, pelvic floor is to work on manual work to the pelvic floor for a increased contraction and progress her exercise   Earlie Counts, PT 02/23/22 11:57 AM

## 2022-02-23 ENCOUNTER — Encounter: Payer: Self-pay | Admitting: Physical Therapy

## 2022-02-23 ENCOUNTER — Other Ambulatory Visit: Payer: Self-pay

## 2022-02-23 ENCOUNTER — Ambulatory Visit: Payer: Medicare Other | Attending: General Surgery | Admitting: Physical Therapy

## 2022-02-23 ENCOUNTER — Encounter: Payer: Self-pay | Admitting: Nurse Practitioner

## 2022-02-23 ENCOUNTER — Ambulatory Visit (INDEPENDENT_AMBULATORY_CARE_PROVIDER_SITE_OTHER): Payer: Medicare Other | Admitting: Nurse Practitioner

## 2022-02-23 VITALS — BP 116/72 | HR 73 | Temp 97.1°F | Ht <= 58 in | Wt 114.2 lb

## 2022-02-23 DIAGNOSIS — Z66 Do not resuscitate: Secondary | ICD-10-CM

## 2022-02-23 DIAGNOSIS — D869 Sarcoidosis, unspecified: Secondary | ICD-10-CM

## 2022-02-23 DIAGNOSIS — M7989 Other specified soft tissue disorders: Secondary | ICD-10-CM

## 2022-02-23 DIAGNOSIS — K5904 Chronic idiopathic constipation: Secondary | ICD-10-CM | POA: Diagnosis not present

## 2022-02-23 DIAGNOSIS — N3945 Continuous leakage: Secondary | ICD-10-CM | POA: Insufficient documentation

## 2022-02-23 DIAGNOSIS — N3281 Overactive bladder: Secondary | ICD-10-CM | POA: Diagnosis not present

## 2022-02-23 DIAGNOSIS — R278 Other lack of coordination: Secondary | ICD-10-CM | POA: Diagnosis not present

## 2022-02-23 DIAGNOSIS — C50512 Malignant neoplasm of lower-outer quadrant of left female breast: Secondary | ICD-10-CM | POA: Diagnosis not present

## 2022-02-23 DIAGNOSIS — I10 Essential (primary) hypertension: Secondary | ICD-10-CM

## 2022-02-23 DIAGNOSIS — M6281 Muscle weakness (generalized): Secondary | ICD-10-CM | POA: Diagnosis not present

## 2022-02-23 DIAGNOSIS — R2689 Other abnormalities of gait and mobility: Secondary | ICD-10-CM | POA: Insufficient documentation

## 2022-02-23 DIAGNOSIS — R1084 Generalized abdominal pain: Secondary | ICD-10-CM

## 2022-02-23 MED ORDER — ALBUTEROL SULFATE HFA 108 (90 BASE) MCG/ACT IN AERS: 2.0000 | INHALATION_SPRAY | Freq: Four times a day (QID) | RESPIRATORY_TRACT | 11 refills | Status: DC | PRN

## 2022-02-23 NOTE — Progress Notes (Signed)
Careteam: Patient Care Team: [name], NP as PCP - General (Geriatric Medicine) [name], MD as PCP - Electrophysiology (Cardiology) [name], MD as Consulting Physician (General Surgery) Paralee Cancel, MD as Consulting Physician (Orthopedic Surgery) Marica Otter, [place] (Optometry) Marcial Pacas, MD as Consulting Physician (Neurology) [name], MD as Consulting Physician (Cardiology) Ricard Dillon, MD (Psychiatry)  PLACE OF SERVICE:  [place] Directive information Does Patient Have a Medical Advance Directive?: Yes, Type of Advance Directive: Living will;Out of facility DNR (pink MOST or yellow form), Pre-existing out of facility DNR order (yellow form or pink MOST form): Yellow form placed in chart (order not valid for inpatient use), Does patient want to make changes to medical advance directive?: No - Patient declined  Allergies  Allergen Reactions   Iodine Shortness Of Breath    Iodine contrast, CHF , SOB   Shellfish Allergy Shortness Of Breath   Memantine     Malaise, fogginess/ couldn't think   Aspirin Nausea And Vomiting   Codeine Nausea And Vomiting    Chief Complaint  Patient presents with   Medical Management of Chronic Issues    4 month follow-up. Discuss need for additional covid boosters. Discuss left wrist and medication.      HPI: Patient is a 88 y.o. female for routine follow up.   Was seen last month due to fatigue and dizziness. She has not needed meclizine. No ongoing dizziness.   OAB- unsure if myrbetriq is actually helping. Had pelvic floor consult today.  She does not really want to continue with urology, they offered botox injections for bladder but she is not interested.   Continues to follow up with GYN due to pessary   Constipation- using miralax and amitiza which is working well for her. Abdominal pain improved.   Continues to work with physical medicine for pain.   Left hand started swelling  about 6 days ago- woke up and noticed it. No pain but some discomfort when making a fist. No redness, heat or bruising noted.   Question if she can have bananas now- was told not to in the past- potassium has been normal  Things have been going well.   Review of Systems:  Review of Systems  Constitutional:  Negative for chills, fever and weight loss.  HENT:  Negative for tinnitus.   Respiratory:  Negative for cough, sputum production and shortness of breath.   Cardiovascular:  Positive for leg swelling (at baseline). Negative for chest pain and palpitations.  Gastrointestinal:  Negative for abdominal pain, constipation, diarrhea and heartburn.  Genitourinary:  Positive for frequency. Negative for dysuria and urgency.  Musculoskeletal:  Positive for back pain, joint pain and myalgias. Negative for falls.  Skin: Negative.   Neurological:  Negative for dizziness and headaches.  Psychiatric/Behavioral:  Negative for depression and memory loss. The patient does not have insomnia.     Past Medical History:  Diagnosis Date   Arthritis    Cancer ([place])    left breast cancer    Cataracts, bilateral    removed by surgery   CHF (congestive heart failure) ([place])    PACEMAKER & DEFIB   Complication of anesthesia    hypotensive after back surgery in 2006   Depression    Dyslipidemia    Fainted 04/21/06   AT [facility]   GERD (gastroesophageal reflux disease)    Headache(784.0)    Hearing loss    bilateral hearing aids   HLD (  hyperlipidemia)    diet controlled    Hypertension    Hypothyroidism    ICD (implantable cardiac defibrillator) in place    pt has pacer/icd   ICD (implantable cardiac defibrillator), biventricular, in situ    LBBB (left bundle branch block)    Memory loss    Nonischemic cardiomyopathy ([place])    Normal coronary arteries    s/p cardiac cath 2007   Pacemaker    ICD [employer]   Syncope    Systolic CHF [facility])    Vertigo    Wears glasses    Past Surgical  History:  Procedure Laterality Date   BACK SURGERY     lumbar fusion    BREAST LUMPECTOMY Left 2008   BREAST SURGERY  2000   LUMP REMOVAL. STAGE 1 CANCER   CARDIAC CATHETERIZATION     CATARACT EXTRACTION     COLONOSCOPY     EYE SURGERY     IMPLANTABLE CARDIOVERTER DEFIBRILLATOR GENERATOR CHANGE N/A 12/18/2012   Procedure: IMPLANTABLE CARDIOVERTER DEFIBRILLATOR GENERATOR CHANGE;  Surgeon: [name], MD;  Location: [facility] CATH LAB;  Service: Cardiovascular;  Laterality: N/A;   JOINT REPLACEMENT  06/14/01   right   LUMBAR FUSION  2006   MASS EXCISION  11/08/2011   Procedure: EXCISION MASS;  Surgeon: [name], MD;  Location: [facility] ORS;  Service: General;  Laterality: Left;  Excision Left Thigh Mass   MASTECTOMY W/ SENTINEL NODE BIOPSY Left 06/04/2019   Procedure: LEFT MASTECTOMY WITH SENTINEL LYMPH NODE BIOPSY;  Surgeon: [name], MD;  Location: [place];  Service: General;  Laterality: Left;   MASTECTOMY, PARTIAL  2008   GOT PACEMAKER AND DEFIB AT THAT TIME   PACEMAKER INSERTION  04/23/06   TOTAL KNEE ARTHROPLASTY  05/17/01   RIGHT KNEE   TOTAL KNEE ARTHROPLASTY Left 11/29/2014   Procedure: TOTAL LEFT KNEE ARTHROPLASTY;  Surgeon: Paralee Cancel, MD;  Location: [facility];  Service: Orthopedics;  Laterality: Left;   Social History:   reports that she has never smoked. She has never been exposed to tobacco smoke. She has never used smokeless tobacco. She reports that she does not drink alcohol and does not use drugs.  Family History  Problem Relation Age of Onset   Hypertension Mother    Arthritis Mother    Hypertension Father    Hypertension Brother    Hypertension Brother     Medications: Patient's Medications  New Prescriptions   No medications on file  Previous Medications   ACETAMINOPHEN (TYLENOL) 500 MG TABLET    Take 500 mg by mouth 2 (two) times daily.  in the morning and afternoon   ACETAMINOPHEN (TYLENOL) 650 MG CR TABLET    Take 650 mg by mouth at bedtime.   ASCORBIC ACID  (VITAMIN C) 500 MG TABLET    Take 500 mg by mouth daily.   BUPROPION (WELLBUTRIN XL) 150 MG 24 HR TABLET    Take 1 tablet (150 mg total) by mouth daily.   CARVEDILOL (COREG) 3.125 MG TABLET    TAKE 1 TABLET BY MOUTH  TWICE DAILY   CHOLECALCIFEROL (VITAMIN D3) 50 MCG (2000 UT) TABS    Take 2,000 Units by mouth daily.    DICLOFENAC SODIUM (VOLTAREN) 1 % GEL    Apply 4 g topically 4 (four) times daily.   DULOXETINE (CYMBALTA) 60 MG CAPSULE    Take 60 mg by mouth daily.   FAMOTIDINE (PEPCID) 10 MG TABLET    Take 10 mg by mouth as  needed for heartburn or indigestion.   FLUTICASONE (FLONASE) 50 MCG/ACT NASAL SPRAY    Place 2 sprays into both nostrils daily.   FUROSEMIDE (LASIX) 20 MG TABLET    Take 1 tablet (20 mg total) by mouth daily as needed.   LEVOTHYROXINE (SYNTHROID, LEVOTHROID) 100 MCG TABLET    Take 1 tablet (100 mcg total) by mouth daily before breakfast.   LIDOCAINE 4 % PTCH    Apply 1 patch topically every 12 (twelve) hours. Apply to back   LUBIPROSTONE (AMITIZA) 8 MCG CAPSULE    TAKE ONE CAPSULE BY MOUTH TWICE DAILY WITH A MEAL   MAGNESIUM 250 MG TABS    Take 250 mg by mouth daily.   MECLIZINE (ANTIVERT) 12.5 MG TABLET    Take 1 tablet (12.5 mg total) by mouth 3 (three) times daily as needed for dizziness.   MIRABEGRON ER (MYRBETRIQ) 50 MG TB24 TABLET    Take 1 tablet (50 mg total) by mouth daily.   NALTREXONE HCL PO    Take 4 mg by mouth at bedtime.   NONFORMULARY OR COMPOUNDED ITEM    Apply 120 Tubes topically daily. Diclofenac/Cyclobenzaprine/lamotrigine/lidocaine/prilocaine (10480) 2%/2%/6%/5%/1.25% cream QTY: 120 GM SIG: (NEURO) APPLY 1-2 PUMPS (1-2 GMS) TO AFFECTED AREA (S) OR FOCAL POINTS 3 TO 4 TIMES DAILY. RUB IN FOR 2 MINUTES TO ACHIEVE MAX PENETRATION. [place] HANDS WELL.   OMEGA-3 FATTY ACIDS (FISH OIL PO)    Take 1 capsule by mouth daily.   OMEPRAZOLE (PRILOSEC) 40 MG CAPSULE    TAKE 1 CAPSULE BY MOUTH DAILY   POLYETHYLENE GLYCOL POWDER (GLYCOLAX/MIRALAX) 17 GM/SCOOP POWDER     Take 1 Container by mouth daily.   PREGABALIN (LYRICA) 25 MG CAPSULE    Take 25 mg by mouth 2 (two) times daily. Morning and night   SENNA-DOCUSATE (SENOKOT S) 8.6-50 MG TABLET    Take 2 tablets by mouth daily.   SODIUM PHOSPHATE (FLEET) 7-19 GM/118ML ENEM    Place 133 mLs (1 enema total) rectally daily as needed for severe constipation.   SPIRONOLACTONE (ALDACTONE) 25 MG TABLET    TAKE 1 TABLET BY MOUTH IN  THE MORNING   TELMISARTAN (MICARDIS) 20 MG TABLET    Take 1 tablet (20 mg total) by mouth daily.   VITAMIN E 400 UNITS TABS    Take 400 Units by mouth daily.   Modified Medications   Modified Medication Previous Medication   ALBUTEROL (VENTOLIN HFA) 108 (90 BASE) MCG/ACT INHALER albuterol (VENTOLIN HFA) 108 (90 Base) MCG/ACT inhaler      Inhale 2 puffs into the lungs every 6 (six) hours as needed for wheezing or shortness of breath.    Inhale 2 puffs into the lungs every 6 (six) hours as needed for wheezing or shortness of breath.  Discontinued Medications   PREGABALIN (LYRICA) 25 MG CAPSULE    Take 1 capsule (25 mg total) by mouth 3 (three) times daily. 1 in AM and 2 at night- for back/nerve pain    Physical Exam:  Vitals:   02/23/22 1416  BP: 116/72  Pulse: 73  Temp: (!) 97.1 F (36.2 C)  TempSrc: Temporal  SpO2: 96%  Weight: 114 lb 3.2 oz (51.8 kg)  Height: '4\' 10"'$  (1.473 m)   Body mass index is 23.87 kg/m. Wt Readings from Last 3 Encounters:  02/23/22 114 lb 3.2 oz (51.8 kg)  02/05/22 112 lb (50.8 kg)  01/24/22 112 lb (50.8 kg)    Physical Exam Constitutional:  General: She is not in acute distress.    Appearance: She is well-developed. She is not diaphoretic.  HENT:     Head: Normocephalic and atraumatic.     Mouth/Throat:     Pharynx: No oropharyngeal exudate.  Eyes:     Conjunctiva/sclera: Conjunctivae normal.     Pupils: Pupils are equal, round, and reactive to light.  Cardiovascular:     Rate and Rhythm: Normal rate and regular rhythm.     Heart  sounds: Normal heart sounds.  Pulmonary:     Effort: Pulmonary effort is normal.     Breath sounds: Normal breath sounds.  Abdominal:     General: Bowel sounds are normal.     Palpations: Abdomen is soft.  Musculoskeletal:     Left hand: Swelling and tenderness (to base of ring finger) present. Normal range of motion. Normal strength. Normal sensation. Normal capillary refill.     Cervical back: Normal range of motion and neck supple.     Right lower leg: No edema.     Left lower leg: No edema.  Skin:    General: Skin is warm and dry.  Neurological:     Mental Status: She is alert and oriented to person, place, and time.  Psychiatric:        Mood and Affect: Mood normal.     Labs reviewed: Basic Metabolic Panel: Recent Labs    06/02/21 0839 06/19/21 1015 10/23/21 1528 12/04/21 1016 12/22/21 0912  NA 138   < > 132* 134* 135  K 4.9   < > 4.3 4.7 4.7  CL 100   < > 97* 99 101  CO2 32   < > '27 27 28  '$ GLUCOSE 103   < > 116 57* 73  BUN 28*   < > '21 19 19  '$ CREATININE 1.04*   < > 0.81 0.77 0.73  CALCIUM 11.1*   < > 10.9* 10.3 10.1  TSH 0.81  --   --   --   --    < > = values in this interval not displayed.   Liver Function Tests: Recent Labs    06/20/21 0945 07/07/21 0951 12/04/21 1016 12/22/21 0912  AST '22 29 22 29  '$ ALT '18 18 20 23  '$ ALKPHOS 60  --   --  69  BILITOT 0.4 0.4 0.3 0.4  PROT 7.0 7.2 6.9 6.5  ALBUMIN 4.0  --   --  3.6   Recent Labs    12/04/21 1016  LIPASE 14  AMYLASE 34   No results for input(s): "AMMONIA" in the last 8760 hours. CBC: Recent Labs    12/04/21 1016 12/22/21 0912 01/17/22 1116  WBC 5.3 5.9 7.1  NEUTROABS 3,466 3.7 5,098  HGB 12.0 11.8* 12.3  HCT 36.4 35.2* 38.0  MCV 90.1 88.7 90.7  PLT 144 151 144   Lipid Panel: No results for input(s): "CHOL", "HDL", "LDLCALC", "TRIG", "CHOLHDL", "LDLDIRECT" in the last 8760 hours. TSH: Recent Labs    06/02/21 0839  TSH 0.81   A1C: No results found for:  "HGBA1C"   Assessment/Plan 1. Generalized abdominal pain Has resolved at this time  2. Overactive bladder -ongoing, reports myrbetriq is not working, she has tried gemtesa in the past, does not want to side effects from the other medications to control bladder. She will stop medicaiton to see if she notices a difference in symptoms.    3. Chronic idiopathic constipation Well controlled on current regimen  4. Essential hypertension -Blood  pressure well controlled, goal bp <140/90 Continue current medications and dietary modifications follow metabolic panel  5. DNR (do not resuscitate) - Do not attempt resuscitation (DNR)  6. Sarcoidosis - albuterol (VENTOLIN HFA) 108 (90 Base) MCG/ACT inhaler; Inhale 2 puffs into the lungs every 6 (six) hours as needed for wheezing or shortness of breath.  Dispense: 8 g; Refill: 11  7. Serum calcium elevated -has improved at this time, really wants to start consuming her mil products again, will have her start every other day.   8. Swelling of left hand -no injury noted, will have her elevate and apply ice. Unable to use NSAIDs due to GI side effects.  To notify if symptoms worsen or do not improve.   Return in about 4 months (around 06/24/2022) for routine follow up. [name]. [place], [facility] [phone]

## 2022-02-28 ENCOUNTER — Ambulatory Visit: Payer: Medicare Other

## 2022-02-28 DIAGNOSIS — R252 Cramp and spasm: Secondary | ICD-10-CM

## 2022-02-28 DIAGNOSIS — M6281 Muscle weakness (generalized): Secondary | ICD-10-CM

## 2022-02-28 DIAGNOSIS — N3945 Continuous leakage: Secondary | ICD-10-CM | POA: Diagnosis not present

## 2022-02-28 NOTE — Therapy (Signed)
OUTPATIENT PHYSICAL THERAPY FEMALE PELVIC TREATMENT NOTE   Patient Name: [name] MRN: [id number] DOB:10/09/1933, 88 y.o., female Today's Date: 02/28/2022  END OF SESSION:  PT End of Session - 02/28/22 0947     Visit Number 2    Date for PT Re-Evaluation 05/18/22    Authorization Type UHC    Authorization - Number of Visits 10    PT Start Time 0930    PT Stop Time 1010    PT Time Calculation (min) 40 min    Activity Tolerance Patient tolerated treatment well    Behavior During Therapy WFL for tasks assessed/performed             Past Medical History:  Diagnosis Date   Arthritis    Cancer ([place])    left breast cancer    Cataracts, bilateral    removed by surgery   CHF (congestive heart failure) ([place])    PACEMAKER & DEFIB   Complication of anesthesia    hypotensive after back surgery in 2006   Depression    Dyslipidemia    Fainted 04/21/06   AT [facility]   GERD (gastroesophageal reflux disease)    Headache(784.0)    Hearing loss    bilateral hearing aids   HLD (hyperlipidemia)    diet controlled    Hypertension    Hypothyroidism    ICD (implantable cardiac defibrillator) in place    pt has pacer/icd   ICD (implantable cardiac defibrillator), biventricular, in situ    LBBB (left bundle branch block)    Memory loss    Nonischemic cardiomyopathy ([place])    Normal coronary arteries    s/p cardiac cath 2007   Pacemaker    ICD [employer]   Syncope    Systolic CHF [facility])    Vertigo    Wears glasses    Past Surgical History:  Procedure Laterality Date   BACK SURGERY     lumbar fusion    BREAST LUMPECTOMY Left 2008   BREAST SURGERY  2000   LUMP REMOVAL. STAGE 1 CANCER   CARDIAC CATHETERIZATION     CATARACT EXTRACTION     COLONOSCOPY     EYE SURGERY     IMPLANTABLE CARDIOVERTER DEFIBRILLATOR GENERATOR CHANGE N/A 12/18/2012   Procedure: IMPLANTABLE CARDIOVERTER DEFIBRILLATOR GENERATOR CHANGE;  Surgeon: [name], MD;  Location: [facility] CATH  LAB;  Service: Cardiovascular;  Laterality: N/A;   JOINT REPLACEMENT  06/14/01   right   LUMBAR FUSION  2006   MASS EXCISION  11/08/2011   Procedure: EXCISION MASS;  Surgeon: [name], MD;  Location: [facility];  Service: General;  Laterality: Left;  Excision Left Thigh Mass   MASTECTOMY W/ SENTINEL NODE BIOPSY Left 06/04/2019   Procedure: LEFT MASTECTOMY WITH SENTINEL LYMPH NODE BIOPSY;  Surgeon: [name], MD;  Location: [place];  Service: General;  Laterality: Left;   MASTECTOMY, PARTIAL  2008   GOT PACEMAKER AND DEFIB AT THAT TIME   PACEMAKER INSERTION  04/23/06   TOTAL KNEE ARTHROPLASTY  05/17/01   RIGHT KNEE   TOTAL KNEE ARTHROPLASTY Left 11/29/2014   Procedure: TOTAL LEFT KNEE ARTHROPLASTY;  Surgeon: Paralee Cancel, MD;  [facility];  Service: Orthopedics;  Laterality: Left;   Patient Active Problem List   Diagnosis Date Noted   Sciatica associated with disorder of multiple sites of spine 03/20/2021   Bipolar II disorder ([place]) 03/20/2021   Chronic pain syndrome 03/20/2021   Dementia without behavioral disturbance ([place]) 02/04/2020   Urinary  retention 02/04/2020   Seasonal allergies 02/04/2020   Recurrent major depressive disorder, in partial remission ([place]) 07/17/2019   Status post left mastectomy 07/01/2019   Breast cancer of lower-outer quadrant of left female breast ([place]) 06/04/2019   Candida infection, oral 01/15/2019   Senile purpura ([place]) 04/14/2018   Lumbar post-laminectomy syndrome 12/03/2017   Lumbar spondylosis 12/03/2017   History of back surgery 09/01/2017   Constipation 09/01/2017   Hypothyroidism due to acquired atrophy of thyroid 09/01/2017   Mixed hyperlipidemia 09/01/2017   Age-related osteoporosis without current pathological fracture 09/01/2017   High risk medication use 09/01/2017   Arthritis of hand 05/17/2017   Atherosclerosis of native arteries of extremity with intermittent claudication ([place]) 05/15/2017   Status post total bilateral knee replacement  12/20/2016   Gait abnormality 07/16/2016   Chronic low back pain 07/16/2016   Mild cognitive impairment 07/16/2016   Wrist pain 05/10/2015   S/P left TKA 11/29/2014   S/P knee replacement 11/29/2014   Spontaneous bruising 08/27/2014   Numbness and tingling in right hand 07/28/2014   Essential tremor 07/28/2014   Dizziness 05/28/2014   Shaky 05/28/2014   Memory loss 05/28/2014   Depression 05/06/2014   Lipoma of left upper thigh 3x5 cm 09/28/2011   Cerebral vascular accident ([place]) 08/09/2011   Syncope 08/09/2011   History of breast cancer T1bNxMx, s/p BCT 2008, triple negative 01/26/2011   Chronic L breast pain with chronic recurrent seroma, s/p excisional biopsy 01/12/2010 01/26/2011   Fainted    ICD (implantable cardioverter-defibrillator), biventricular, in situ 29/92/4268   Chronic systolic heart failure ([place]) 08/02/2010   Essential hypertension 08/02/2010    PCP: [name], NP  REFERRING PROVIDER: [name], MD   REFERRING DIAG: N39.45 (ICD-10-CM) - Continuous leakage  THERAPY DIAG:  Muscle weakness (generalized)  Cramp and spasm  Rationale for Evaluation and Treatment: Rehabilitation  ONSET DATE: 6 months ago  SUBJECTIVE:                                                                                                                                                                                          Patient reports no new issues.  "Doing ok" SUBJECTIVE STATEMENT: Patient has a pessary in. Patient has had a pessary since January with several trials to have one fit her correctly. She has one that is staying in and feels chafing. MD prescribed Estradial.  Fluid intake: Yes: water and 2 glasses of ginger tea    PAIN:  Are you having pain? No  PRECAUTIONS: ICD/Pacemaker, left breast cancer  WEIGHT BEARING RESTRICTIONS: No  FALLS:  Has patient fallen in last 6 months? No  LIVING ENVIRONMENT: Lives with: lives with their family   OCCUPATION:  retired  PLOF: Independent  PATIENT GOALS: reduce leakage  PERTINENT HISTORY:  Left breast cancer; CHF; Hypothyroidism; Pacemaker; Lumbar fusion; Left knee arthroplasty   URINATION: Pain with urination: No Fully empty bladder: Yes: sometimes, after she is done urinating she feels like there is still some urine remaining Stream: Strong, sometimes has a trickling flow Urgency: Yes:   Frequency: day time voids 4 night times every 2 hours or 1 hour Leakage:  does not feel the leakage Pads: Yes: 4 pads per day, night wears a depends with toilet paper  PREGNANCY: Vaginal deliveries 1   PROLAPSE: Cystocele   and Urethrocele     OBJECTIVE:   DIAGNOSTIC FINDINGS:  Pessary fittings   COGNITION: Overall cognitive status: Within functional limits for tasks assessed     SENSATION: Light touch: Appears intact Proprioception: Appears intact   FUNCTIONAL TESTS:  5 times sit to stand: 20 sec Timed up and go (TUG): 15 sec  GAIT: Assistive device utilized: Single point cane, when she is going out Level of assistance: Complete Independence Comments: small steps and slowly   POSTURE: No Significant postural limitations  PELVIC ALIGNMENT:   LOWER EXTREMITY ROM:  Passive ROM Right eval Left eval  Hip external rotation 25 45   (Blank rows = not tested)  LOWER EXTREMITY MMT:  MMT Right eval Left eval  Hip extension 3/5 3/5  Hip abduction 3/5 3/5  Hip adduction 3/5 3/5   PALPATION:    External Perineal Exam some redness in the vulva area                             Internal Pelvic Floor tightness in the introitus. She would contract her gluteal more than the vaginal but after tactile cues was able to isolate the vaginal canal.   Patient confirms identification and approves PT to assess internal pelvic floor and treatment Yes  PELVIC MMT:   MMT eval  Vaginal 1/5        TONE: Low tone  PROLAPSE: Had pessary in  TODAY'S TREATMENT:                                                                                                                               DATE: 02/28/2022  Nustep x 5 min level  Seated hip adduction with purple ball 2 x 10 Seated clam 2 x 10 with green band  Seated ball press into knees 3 x 10 with purple ball  Sit to stand with pelvic floor contraction x 10 Hook lying bridging x 10 Hook lying ball squeeze 2 x 10  DATE: 02/23/2022  EVAL See below   PATIENT EDUCATION:  02/23/2022 Education details: Access Code: [email or web address] Person educated: Patient Education method: Consulting civil engineer, Demonstration, Corporate treasurer cues, Verbal cues, and Handouts Education comprehension: verbalized understanding, returned demonstration, verbal cues required, tactile cues required,  and needs further education  HOME EXERCISE PROGRAM: 02/23/2022 Access Code: [email or web address] URL: [email or web address] Date: 02/23/2022 Prepared by: Earlie Counts  Exercises - Supine Pelvic Floor Contraction  - 3 x daily - 7 x weekly - 1 sets - 10 reps - 5 sec hold  ASSESSMENT:  CLINICAL IMPRESSION: Patient was able to do all tasks with mod fatigue.  She had one trip to bathroom during session due to urgency.  She demonstrates excellent pelvic tilt.  She should continue to do well.   Patient will benefit from skilled therapy to improve pelvic floor strength to reduce leakage and improve her balance to walk to the commode safely.   OBJECTIVE IMPAIRMENTS: decreased activity tolerance, decreased coordination, decreased endurance, decreased mobility, difficulty walking, decreased strength, and increased fascial restrictions.   ACTIVITY LIMITATIONS: standing, transfers, continence, toileting, and locomotion level  PARTICIPATION LIMITATIONS: shopping and community activity  PERSONAL FACTORS: Age, Fitness, Past/current experiences, and 1-2 comorbidities: Left breast cancer; CHF; Hypothyroidism; Pacemaker; Lumbar fusion; Left knee arthroplasty  are also affecting patient's  functional outcome.   REHAB POTENTIAL: Good  CLINICAL DECISION MAKING: Evolving/moderate complexity  EVALUATION COMPLEXITY: Moderate   GOALS: Goals reviewed with patient? Yes  SHORT TERM GOALS: Target date: 03/23/2022  Patient independent with initial HEP for pelvic floor contraction.  Baseline: Goal status: INITIAL  2.  Patient is able to feel the urinary leakage due to improve pelvic floor tone.  Baseline:  Goal status: INITIAL  3.  Patient educated on the urge to void to be able to walk to the bathroom slowl Baseline:  Goal status: INITIAL  4.  Timed up and go </= 14 sec Baseline:  Goal status: INITIAL  5.  Sit to stand </- 18 sec  Baseline:  Goal status: INITIAL   LONG TERM GOALS: Target date: 05/18/2021  Patient independent with advanced hep for pelvic floor strength and balance exercises.  Baseline:  Goal status: INITIAL  2.  Patient reports her urinary leakage is </= 50% due to improved pelvic floor strength >/ 3/5 Baseline:  Goal status: INITIAL  3.  Time up and go test is </= 13 sec. So she is able to walk to the commode safely when she has the urge to urinate.  Baseline:  Goal status: INITIAL  4.  Sit to stand score is </= 15 sec so she is able to safely get out of the chair or get up from the commode.  Baseline:  Goal status: INITIAL  5.  Patient is able to wake up 2 times at night instead of every hour due to increased in pelvic floor strength and understanding on how to use the urge to void behavioral technique.  Baseline:  Goal status: INITIAL   PLAN:  PT FREQUENCY: 1-2x/week  PT DURATION: 12 weeks  PLANNED INTERVENTIONS: Therapeutic exercises, Therapeutic activity, Neuromuscular re-education, Balance training, Gait training, Patient/Family education, Self Care, Biofeedback, and Manual therapy  PLAN FOR NEXT SESSION: work on balance activity, hip strength, and walking, pelvic floor is to work on manual work to the pelvic floor for a  increased contraction and progress her exercise   [employer] B. Trinidy Masterson, PT 02/28/22 10:11 AM  [place] [address] [place], [place] [address] Phone # [phone] Fax [phone]

## 2022-03-04 NOTE — Therapy (Signed)
OUTPATIENT PHYSICAL THERAPY FEMALE PELVIC TREATMENT NOTE   Patient Name: [name] MRN: [id number] DOB:June 06, 1933, 88 y.o., female Today's Date: 03/05/2022  END OF SESSION:  PT End of Session - 03/05/22 0937     Visit Number 3    Date for PT Re-Evaluation 05/18/22    Authorization Type UHC    Authorization - Visit Number 3    Authorization - Number of Visits 10    PT Start Time [phone]    Activity Tolerance Patient tolerated treatment well    Behavior During Therapy [facility] for tasks assessed/performed             Past Medical History:  Diagnosis Date   Arthritis    Cancer ([place])    left breast cancer    Cataracts, bilateral    removed by surgery   CHF (congestive heart failure) ([place])    PACEMAKER & DEFIB   Complication of anesthesia    hypotensive after back surgery in 2006   Depression    Dyslipidemia    Fainted 04/21/06   AT [facility]   GERD (gastroesophageal reflux disease)    Headache(784.0)    Hearing loss    bilateral hearing aids   HLD (hyperlipidemia)    diet controlled    Hypertension    Hypothyroidism    ICD (implantable cardiac defibrillator) in place    pt has pacer/icd   ICD (implantable cardiac defibrillator), biventricular, in situ    LBBB (left bundle branch block)    Memory loss    Nonischemic cardiomyopathy ([place])    Normal coronary arteries    s/p cardiac cath 2007   Pacemaker    ICD [employer]   Syncope    Systolic CHF [facility])    Vertigo    Wears glasses    Past Surgical History:  Procedure Laterality Date   BACK SURGERY     lumbar fusion    BREAST LUMPECTOMY Left 2008   BREAST SURGERY  2000   LUMP REMOVAL. STAGE 1 CANCER   CARDIAC CATHETERIZATION     CATARACT EXTRACTION     COLONOSCOPY     EYE SURGERY     IMPLANTABLE CARDIOVERTER DEFIBRILLATOR GENERATOR CHANGE N/A 12/18/2012   Procedure: IMPLANTABLE CARDIOVERTER DEFIBRILLATOR GENERATOR CHANGE;  Surgeon: [name], MD;  Location: [facility] CATH LAB;  Service:  Cardiovascular;  Laterality: N/A;   JOINT REPLACEMENT  06/14/01   right   LUMBAR FUSION  2006   MASS EXCISION  11/08/2011   Procedure: EXCISION MASS;  Surgeon: [name], MD;  [facility];  Service: General;  Laterality: Left;  Excision Left Thigh Mass   MASTECTOMY W/ SENTINEL NODE BIOPSY Left 06/04/2019   Procedure: LEFT MASTECTOMY WITH SENTINEL LYMPH NODE BIOPSY;  Surgeon: [name], MD;  Location: [place];  Service: General;  Laterality: Left;   MASTECTOMY, PARTIAL  2008   GOT PACEMAKER AND DEFIB AT THAT TIME   PACEMAKER INSERTION  04/23/06   TOTAL KNEE ARTHROPLASTY  05/17/01   RIGHT KNEE   TOTAL KNEE ARTHROPLASTY Left 11/29/2014   Procedure: TOTAL LEFT KNEE ARTHROPLASTY;  Surgeon: Paralee Cancel, MD;  [facility];  Service: Orthopedics;  Laterality: Left;   Patient Active Problem List   Diagnosis Date Noted   Sciatica associated with disorder of multiple sites of spine 03/20/2021   Bipolar II disorder ([place]) 03/20/2021   Chronic pain syndrome 03/20/2021   Dementia without behavioral disturbance ([place]) 02/04/2020   Urinary retention 02/04/2020   Seasonal allergies 02/04/2020  Recurrent major depressive disorder, in partial remission ([place]) 07/17/2019   Status post left mastectomy 07/01/2019   Breast cancer of lower-outer quadrant of left female breast ([place]) 06/04/2019   Candida infection, oral 01/15/2019   Senile purpura ([place]) 04/14/2018   Lumbar post-laminectomy syndrome 12/03/2017   Lumbar spondylosis 12/03/2017   History of back surgery 09/01/2017   Constipation 09/01/2017   Hypothyroidism due to acquired atrophy of thyroid 09/01/2017   Mixed hyperlipidemia 09/01/2017   Age-related osteoporosis without current pathological fracture 09/01/2017   High risk medication use 09/01/2017   Arthritis of hand 05/17/2017   Atherosclerosis of native arteries of extremity with intermittent claudication ([place]) 05/15/2017   Status post total bilateral knee replacement 12/20/2016    Gait abnormality 07/16/2016   Chronic low back pain 07/16/2016   Mild cognitive impairment 07/16/2016   Wrist pain 05/10/2015   S/P left TKA 11/29/2014   S/P knee replacement 11/29/2014   Spontaneous bruising 08/27/2014   Numbness and tingling in right hand 07/28/2014   Essential tremor 07/28/2014   Dizziness 05/28/2014   Shaky 05/28/2014   Memory loss 05/28/2014   Depression 05/06/2014   Lipoma of left upper thigh 3x5 cm 09/28/2011   Cerebral vascular accident ([place]) 08/09/2011   Syncope 08/09/2011   History of breast cancer T1bNxMx, s/p BCT 2008, triple negative 01/26/2011   Chronic L breast pain with chronic recurrent seroma, s/p excisional biopsy 01/12/2010 01/26/2011   Fainted    ICD (implantable cardioverter-defibrillator), biventricular, in situ 69/67/8938   Chronic systolic heart failure ([place]) 08/02/2010   Essential hypertension 08/02/2010    PCP: [name], NP  REFERRING PROVIDER: [name], MD   REFERRING DIAG: N39.45 (ICD-10-CM) - Continuous leakage  THERAPY DIAG:  Muscle weakness (generalized)  Cramp and spasm  Other abnormalities of gait and mobility  Other lack of coordination  Rationale for Evaluation and Treatment: Rehabilitation  ONSET DATE: 6 months ago  SUBJECTIVE:                                                                                                                                                                                          Patient reports no new issues.  "Doing ok"  SUBJECTIVE STATEMENT: My main pain is in my buttocks PAIN:  Are you having pain? Mild/moderate in Bil buttocks 4-5/10. Worse with prolonged sitting.   PRECAUTIONS: ICD/Pacemaker, left breast cancer  WEIGHT BEARING RESTRICTIONS: No  FALLS:  Has patient fallen in last 6 months? No  LIVING ENVIRONMENT: Lives with: lives with their family   OCCUPATION: retired  PLOF: Independent  PATIENT GOALS: reduce leakage  PERTINENT HISTORY:  Left  breast cancer;  CHF; Hypothyroidism; Pacemaker; Lumbar fusion; Left knee arthroplasty   URINATION: Pain with urination: No Fully empty bladder: Yes: sometimes, after she is done urinating she feels like there is still some urine remaining Stream: Strong, sometimes has a trickling flow Urgency: Yes:   Frequency: day time voids 4 night times every 2 hours or 1 hour Leakage:  does not feel the leakage Pads: Yes: 4 pads per day, night wears a depends with toilet paper  PREGNANCY: Vaginal deliveries 1   PROLAPSE: Cystocele   and Urethrocele     OBJECTIVE:   DIAGNOSTIC FINDINGS:  Pessary fittings   COGNITION: Overall cognitive status: Within functional limits for tasks assessed     SENSATION: Light touch: Appears intact Proprioception: Appears intact   FUNCTIONAL TESTS:  5 times sit to stand: 20 sec Timed up and go (TUG): 15 sec  GAIT: Assistive device utilized: Single point cane, when she is going out Level of assistance: Complete Independence Comments: small steps and slowly   POSTURE: No Significant postural limitations  PELVIC ALIGNMENT:   LOWER EXTREMITY ROM:  Passive ROM Right eval Left eval  Hip external rotation 25 45   (Blank rows = not tested)  LOWER EXTREMITY MMT:  MMT Right eval Left eval  Hip extension 3/5 3/5  Hip abduction 3/5 3/5  Hip adduction 3/5 3/5   PALPATION:    External Perineal Exam some redness in the vulva area                             Internal Pelvic Floor tightness in the introitus. She would contract her gluteal more than the vaginal but after tactile cues was able to isolate the vaginal canal.   Patient confirms identification and approves PT to assess internal pelvic floor and treatment Yes  PELVIC MMT:   MMT eval  Vaginal 1/5        TONE: Low tone  PROLAPSE: Had pessary in  TODAY'S TREATMENT:    03/05/22:  Nustep: New model: L4 x 5 min with concurrent discuss of pain & status Seated ball squeeze 5 sec  hold 10x Blue loop seated clamshells 2x10 Vc to sit tall throughout Supine Bil piriformis stretch: 2x 20 sec Bil; added to HEP   Glutes squeezes with PF contraction 3 sec hold 10x; added to HEP Bridge 2x5 with PF contraction at top, Vc to hold the contraction as she lowers.    Sit to stand 3x with Vc to make PF contraction as she stands: stopped at 3 for bathroom break.                                                                                                                          DATE: 02/28/2022  Nustep x 5 min level  Seated hip adduction with purple ball 2 x 10 Seated clam 2 x 10 with green band  Seated ball press into knees 3 x 10 with purple  ball  Sit to stand with pelvic floor contraction x 10 Hook lying bridging x 10 Hook lying ball squeeze 2 x 10  DATE: 02/23/2022  EVAL See below   PATIENT EDUCATION:  02/23/2022 Education details: Access Code: [email or web address] Person educated: Patient Education method: Consulting civil engineer, Demonstration, Corporate treasurer cues, Verbal cues, and Handouts Education comprehension: verbalized understanding, returned demonstration, verbal cues required, tactile cues required, and needs further education  HOME EXERCISE PROGRAM: 02/23/2022 Access Code: [email or web address] URL: [email or web address] Date: 02/23/2022 Prepared by: Earlie Counts  Exercises - Supine Pelvic Floor Contraction  - 3 x daily - 7 x weekly - 1 sets - 10 reps - 5 sec hold Added 03/05/22: Glute squeezes and supine piriformis stretch. Myrene Galas, PTA 03/05/22 10:01 AM   ASSESSMENT:  CLINICAL IMPRESSION: Pt main complaint this AM is bil gluteal pain. Pt HEP progressed to include gentle piriformis stretch and glute squeezes with pelvic floor contraction. Pt had 1 bathroom break near end of session. No pain reported with exercises. Pt bed mobility slow.  OBJECTIVE IMPAIRMENTS: decreased activity tolerance, decreased coordination, decreased endurance, decreased mobility, difficulty  walking, decreased strength, and increased fascial restrictions.   ACTIVITY LIMITATIONS: standing, transfers, continence, toileting, and locomotion level  PARTICIPATION LIMITATIONS: shopping and community activity  PERSONAL FACTORS: Age, Fitness, Past/current experiences, and 1-2 comorbidities: Left breast cancer; CHF; Hypothyroidism; Pacemaker; Lumbar fusion; Left knee arthroplasty  are also affecting patient's functional outcome.   REHAB POTENTIAL: Good  CLINICAL DECISION MAKING: Evolving/moderate complexity  EVALUATION COMPLEXITY: Moderate   GOALS: Goals reviewed with patient? Yes  SHORT TERM GOALS: Target date: 03/23/2022  Patient independent with initial HEP for pelvic floor contraction.  Baseline: Goal status: Goal met 03/05/22  2.  Patient is able to feel the urinary leakage due to improve pelvic floor tone.  Baseline:  Goal status: INITIAL  3.  Patient educated on the urge to void to be able to walk to the bathroom slowl Baseline:  Goal status: INITIAL  4.  Timed up and go </= 14 sec Baseline:  Goal status: INITIAL  5.  Sit to stand </- 18 sec  Baseline:  Goal status: INITIAL   LONG TERM GOALS: Target date: 05/18/2021  Patient independent with advanced hep for pelvic floor strength and balance exercises.  Baseline:  Goal status: INITIAL  2.  Patient reports her urinary leakage is </= 50% due to improved pelvic floor strength >/ 3/5 Baseline:  Goal status: INITIAL  3.  Time up and go test is </= 13 sec. So she is able to walk to the commode safely when she has the urge to urinate.  Baseline:  Goal status: INITIAL  4.  Sit to stand score is </= 15 sec so she is able to safely get out of the chair or get up from the commode.  Baseline:  Goal status: INITIAL  5.  Patient is able to wake up 2 times at night instead of every hour due to increased in pelvic floor strength and understanding on how to use the urge to void behavioral technique.  Baseline:   Goal status: INITIAL   PLAN:  PT FREQUENCY: 1-2x/week  PT DURATION: 12 weeks  PLANNED INTERVENTIONS: Therapeutic exercises, Therapeutic activity, Neuromuscular re-education, Balance training, Gait training, Patient/Family education, Self Care, Biofeedback, and Manual therapy  PLAN FOR NEXT SESSION: Continue with hip strength: consider adding standing hip exercises to incorporate balance.   Myrene Galas, PTA 03/05/22 10:01 AM  [facility] [address] [place], [place] [address] Phone #  [phone] Fax [phone]

## 2022-03-05 ENCOUNTER — Ambulatory Visit: Payer: Medicare Other | Admitting: Physical Therapy

## 2022-03-05 ENCOUNTER — Encounter: Payer: Self-pay | Admitting: Physical Therapy

## 2022-03-05 DIAGNOSIS — R252 Cramp and spasm: Secondary | ICD-10-CM

## 2022-03-05 DIAGNOSIS — R278 Other lack of coordination: Secondary | ICD-10-CM

## 2022-03-05 DIAGNOSIS — R2689 Other abnormalities of gait and mobility: Secondary | ICD-10-CM

## 2022-03-05 DIAGNOSIS — N3945 Continuous leakage: Secondary | ICD-10-CM | POA: Diagnosis not present

## 2022-03-05 DIAGNOSIS — M6281 Muscle weakness (generalized): Secondary | ICD-10-CM

## 2022-03-09 ENCOUNTER — Ambulatory Visit: Payer: Medicare Other | Attending: General Surgery | Admitting: Physical Therapy

## 2022-03-09 ENCOUNTER — Encounter: Payer: Self-pay | Admitting: Physical Therapy

## 2022-03-09 DIAGNOSIS — M6281 Muscle weakness (generalized): Secondary | ICD-10-CM | POA: Diagnosis present

## 2022-03-09 DIAGNOSIS — R278 Other lack of coordination: Secondary | ICD-10-CM | POA: Insufficient documentation

## 2022-03-09 DIAGNOSIS — R262 Difficulty in walking, not elsewhere classified: Secondary | ICD-10-CM | POA: Diagnosis present

## 2022-03-09 DIAGNOSIS — R2689 Other abnormalities of gait and mobility: Secondary | ICD-10-CM | POA: Insufficient documentation

## 2022-03-09 DIAGNOSIS — R252 Cramp and spasm: Secondary | ICD-10-CM | POA: Insufficient documentation

## 2022-03-09 DIAGNOSIS — R293 Abnormal posture: Secondary | ICD-10-CM | POA: Diagnosis present

## 2022-03-09 NOTE — Therapy (Signed)
OUTPATIENT PHYSICAL THERAPY TREATMENT NOTE   Patient Name: [name] MRN: [id number] DOB:13-Mar-1934, 88 y.o., female Today's Date: 03/09/2022  PCP: [name], NP  REFERRING PROVIDER: [name], MD    END OF SESSION:   PT End of Session - 03/09/22 1106     Visit Number 4    Date for PT Re-Evaluation 05/18/22    Authorization Type [employer]    Authorization - Visit Number 4    Authorization - Number of Visits 10    PT Start Time 1100    PT Stop Time 1140    PT Time Calculation (min) 40 min    Activity Tolerance Patient tolerated treatment well    Behavior During Therapy WFL for tasks assessed/performed             Past Medical History:  Diagnosis Date   Arthritis    Cancer ([place])    left breast cancer    Cataracts, bilateral    removed by surgery   CHF (congestive heart failure) ([place])    PACEMAKER & DEFIB   Complication of anesthesia    hypotensive after back surgery in 2006   Depression    Dyslipidemia    Fainted 04/21/06   AT [facility]   GERD (gastroesophageal reflux disease)    Headache(784.0)    Hearing loss    bilateral hearing aids   HLD (hyperlipidemia)    diet controlled    Hypertension    Hypothyroidism    ICD (implantable cardiac defibrillator) in place    pt has pacer/icd   ICD (implantable cardiac defibrillator), biventricular, in situ    LBBB (left bundle branch block)    Memory loss    Nonischemic cardiomyopathy ([place])    Normal coronary arteries    s/p cardiac cath 2007   Pacemaker    ICD [employer]   Syncope    Systolic CHF [facility])    Vertigo    Wears glasses    Past Surgical History:  Procedure Laterality Date   BACK SURGERY     lumbar fusion    BREAST LUMPECTOMY Left 2008   BREAST SURGERY  2000   LUMP REMOVAL. STAGE 1 CANCER   CARDIAC CATHETERIZATION     CATARACT EXTRACTION     COLONOSCOPY     EYE SURGERY     IMPLANTABLE CARDIOVERTER DEFIBRILLATOR GENERATOR CHANGE N/A 12/18/2012   Procedure: IMPLANTABLE  CARDIOVERTER DEFIBRILLATOR GENERATOR CHANGE;  Surgeon: [name], MD;  Location: [facility] CATH LAB;  Service: Cardiovascular;  Laterality: N/A;   JOINT REPLACEMENT  06/14/01   right   LUMBAR FUSION  2006   MASS EXCISION  11/08/2011   Procedure: EXCISION MASS;  Surgeon: [name], MD;  Location: [facility];  Service: General;  Laterality: Left;  Excision Left Thigh Mass   MASTECTOMY W/ SENTINEL NODE BIOPSY Left 06/04/2019   Procedure: LEFT MASTECTOMY WITH SENTINEL LYMPH NODE BIOPSY;  Surgeon: [name], MD;  Location: [place];  Service: General;  Laterality: Left;   MASTECTOMY, PARTIAL  2008   GOT PACEMAKER AND DEFIB AT THAT TIME   PACEMAKER INSERTION  04/23/06   TOTAL KNEE ARTHROPLASTY  05/17/01   RIGHT KNEE   TOTAL KNEE ARTHROPLASTY Left 11/29/2014   Procedure: TOTAL LEFT KNEE ARTHROPLASTY;  Surgeon: Paralee Cancel, MD;  Location: [facility];  Service: Orthopedics;  Laterality: Left;   Patient Active Problem List   Diagnosis Date Noted   Sciatica associated with disorder of multiple sites of spine 03/20/2021   Bipolar  II disorder ([place]) 03/20/2021   Chronic pain syndrome 03/20/2021   Dementia without behavioral disturbance ([place]) 02/04/2020   Urinary retention 02/04/2020   Seasonal allergies 02/04/2020   Recurrent major depressive disorder, in partial remission ([place]) 07/17/2019   Status post left mastectomy 07/01/2019   Breast cancer of lower-outer quadrant of left female breast ([place]) 06/04/2019   Candida infection, oral 01/15/2019   Senile purpura ([place]) 04/14/2018   Lumbar post-laminectomy syndrome 12/03/2017   Lumbar spondylosis 12/03/2017   History of back surgery 09/01/2017   Constipation 09/01/2017   Hypothyroidism due to acquired atrophy of thyroid 09/01/2017   Mixed hyperlipidemia 09/01/2017   Age-related osteoporosis without current pathological fracture 09/01/2017   High risk medication use 09/01/2017   Arthritis of hand 05/17/2017   Atherosclerosis of native arteries of extremity with  intermittent claudication ([place]) 05/15/2017   Status post total bilateral knee replacement 12/20/2016   Gait abnormality 07/16/2016   Chronic low back pain 07/16/2016   Mild cognitive impairment 07/16/2016   Wrist pain 05/10/2015   S/P left TKA 11/29/2014   S/P knee replacement 11/29/2014   Spontaneous bruising 08/27/2014   Numbness and tingling in right hand 07/28/2014   Essential tremor 07/28/2014   Dizziness 05/28/2014   Shaky 05/28/2014   Memory loss 05/28/2014   Depression 05/06/2014   Lipoma of left upper thigh 3x5 cm 09/28/2011   Cerebral vascular accident ([place]) 08/09/2011   Syncope 08/09/2011   History of breast cancer [id number], s/p BCT 2008, triple negative 01/26/2011   Chronic L breast pain with chronic recurrent seroma, s/p excisional biopsy 01/12/2010 01/26/2011   Fainted    ICD (implantable cardioverter-defibrillator), biventricular, in situ 02/77/4128   Chronic systolic heart failure ([place]) 08/02/2010   Essential hypertension 08/02/2010   REFERRING DIAG: N39.45 (ICD-10-CM) - Continuous leakage   THERAPY DIAG:  Muscle weakness (generalized)   Cramp and spasm   Other abnormalities of gait and mobility   Other lack of coordination   Rationale for Evaluation and Treatment: Rehabilitation   ONSET DATE: 6 months ago   SUBJECTIVE:                                                                                                                                                                                          Patient reports no new issues.  "Doing ok"   SUBJECTIVE STATEMENT: Pessary is helping. My pads are just as wet.   PAIN:  Are you having pain? Mild/moderate in Bil buttocks 4-5/10. Worse with prolonged sitting.    PRECAUTIONS: ICD/Pacemaker, left breast cancer   WEIGHT BEARING RESTRICTIONS: No   FALLS:  Has patient fallen in last 6  months? No   LIVING ENVIRONMENT: Lives with: lives with their family     OCCUPATION: retired   PLOF: Independent    PATIENT GOALS: reduce leakage   PERTINENT HISTORY:  Left breast cancer; CHF; Hypothyroidism; Pacemaker; Lumbar fusion; Left knee arthroplasty     URINATION: Pain with urination: No Fully empty bladder: Yes: sometimes, after she is done urinating she feels like there is still some urine remaining Stream: Strong, sometimes has a trickling flow Urgency: Yes:   Frequency: day time voids 4 night times every 2 hours or 1 hour Leakage:  does not feel the leakage Pads: Yes: 4 pads per day, night wears a depends with toilet paper   PREGNANCY: Vaginal deliveries 1     PROLAPSE: Cystocele   and Urethrocele      OBJECTIVE: (objective measures completed at initial evaluation unless otherwise dated)   DIAGNOSTIC FINDINGS:  Pessary fittings     COGNITION: Overall cognitive status: Within functional limits for tasks assessed                          SENSATION: Light touch: Appears intact Proprioception: Appears intact     FUNCTIONAL TESTS:  5 times sit to stand: 20 sec Timed up and go (TUG): 15 sec 03/09/2022: 16 sec, 13 sec, 11 sec   GAIT: Assistive device utilized: Single point cane, when she is going out Level of assistance: Complete Independence Comments: small steps and slowly    POSTURE: No Significant postural limitations   PELVIC ALIGNMENT:     LOWER EXTREMITY ROM:   Passive ROM Right eval Left eval  Hip external rotation 25 45   (Blank rows = not tested)   LOWER EXTREMITY MMT:   MMT Right eval Left eval  Hip extension 3/5 3/5  Hip abduction 3/5 3/5  Hip adduction 3/5 3/5    PALPATION:    External Perineal Exam some redness in the vulva area                             Internal Pelvic Floor tightness in the introitus. She would contract her gluteal more than the vaginal but after tactile cues was able to isolate the vaginal canal.    Patient confirms identification and approves PT to assess internal pelvic floor and treatment Yes   PELVIC MMT:    MMT eval 03/09/2022  Vaginal 1/5 2/5        TONE: Low tone   PROLAPSE: Had pessary in   TODAY'S TREATMENT:   03/09/2022 Manual: Myofascial release: Fascial release to the urogenital diaphragm and ischiocavernosus Internal pelvic floor techniques:No emotional/communication barriers or cognitive limitation. Patient is motivated to learn. Patient understands and agrees with treatment goals and plan. PT explains patient will be examined in standing, sitting, and lying down to see how their muscles and joints work. When they are ready, they will be asked to remove their underwear so PT can examine their perineum. The patient is also given the option of providing their own chaperone as one is not provided in our facility. The patient also has the right and is explained the right to defer or refuse any part of the evaluation or treatment including the internal exam. With the patient's consent, PT will use one gloved finger to gently assess the muscles of the pelvic floor, seeing how well it contracts and relaxes and if there is muscle symmetry. After, the patient will  get dressed and PT and patient will discuss exam findings and plan of care. PT and patient discuss plan of care, schedule, attendance policy and HEP activities.  Going in the vagina working on the introitus and the perineal body to improve pelvic floor contraction Neuromuscular re-education: Pelvic floor contraction training: Using therapist index finger to work on pelvic floor contraction holding 5 seconds and quick flicks in supine Sitting squeeze ball with pelvic floor contraction Marching in sitting with pelvic floor contraction Exercises: Strengthening: Nustep x 5 min level while assessing patient      03/05/22:  Nustep: New model: L4 x 5 min with concurrent discuss of pain & status Seated ball squeeze 5 sec hold 10x Blue loop seated clamshells 2x10 Vc to sit tall throughout Supine Bil piriformis stretch: 2x 20 sec Bil;  added to HEP   Glutes squeezes with PF contraction 3 sec hold 10x; added to HEP Bridge 2x5 with PF contraction at top, Vc to hold the contraction as she lowers.    Sit to stand 3x with Vc to make PF contraction as she stands: stopped at 3 for bathroom break.                                                                                                                          DATE: 02/28/2022  Nustep x 5 min level  Seated hip adduction with purple ball 2 x 10 Seated clam 2 x 10 with green band  Seated ball press into knees 3 x 10 with purple ball  Sit to stand with pelvic floor contraction x 10 Hook lying bridging x 10 Hook lying ball squeeze 2 x 10   DATE: 02/23/2022  EVAL See below     PATIENT EDUCATION:  02/23/2022 Education details: Access Code: [email or web address] Person educated: Patient Education method: Consulting civil engineer, Demonstration, Corporate treasurer cues, Verbal cues, and Handouts Education comprehension: verbalized understanding, returned demonstration, verbal cues required, tactile cues required, and needs further education   HOME EXERCISE PROGRAM: 02/23/2022 Access Code: [email or web address] URL: [email or web address] Date: 02/23/2022 Prepared by: Earlie Counts   Exercises - Supine Pelvic Floor Contraction  - 3 x daily - 7 x weekly - 1 sets - 10 reps - 5 sec hold Added 03/05/22: Glute squeezes and supine piriformis stretch. Myrene Galas, PTA 03/05/22 10:01 AM    ASSESSMENT:   CLINICAL IMPRESSION: Patient time up and go score went from 16 sec to 11 sec after 3 trials today. Her pelvic floor strength increased to 2/5. She has good mobility of her pelvic floor muscles and able to relax them after she contracts. Patient reports no change in urinary leakage at this time. Patient will benefit from skilled therapy to improve strength and coordination to reduce leakage and improve balance as she is walking to the commode.    OBJECTIVE IMPAIRMENTS: decreased activity tolerance, decreased  coordination, decreased endurance, decreased mobility, difficulty walking, decreased strength, and increased fascial restrictions.    ACTIVITY LIMITATIONS: standing, transfers, continence, toileting, and locomotion  level   PARTICIPATION LIMITATIONS: shopping and community activity   PERSONAL FACTORS: Age, Fitness, Past/current experiences, and 1-2 comorbidities: Left breast cancer; CHF; Hypothyroidism; Pacemaker; Lumbar fusion; Left knee arthroplasty  are also affecting patient's functional outcome.    REHAB POTENTIAL: Good   CLINICAL DECISION MAKING: Evolving/moderate complexity   EVALUATION COMPLEXITY: Moderate     GOALS: Goals reviewed with patient? Yes   SHORT TERM GOALS: Target date: 03/23/2022   Patient independent with initial HEP for pelvic floor contraction.  Baseline: Goal status: Goal met 03/05/22   2.  Patient is able to feel the urinary leakage due to improve pelvic floor tone.  Baseline:  Goal status: INITIAL   3.  Patient educated on the urge to void to be able to walk to the bathroom slowl Baseline:  Goal status: INITIAL   4.  Timed up and go </= 14 sec Baseline:  Goal status: Met 03/09/2022   5.  Sit to stand </- 18 sec  Baseline:  Goal status: INITIAL     LONG TERM GOALS: Target date: 05/18/2021   Patient independent with advanced hep for pelvic floor strength and balance exercises.  Baseline:  Goal status: INITIAL   2.  Patient reports her urinary leakage is </= 50% due to improved pelvic floor strength >/ 3/5 Baseline:  Goal status: INITIAL   3.  Time up and go test is </= 13 sec. So she is able to walk to the commode safely when she has the urge to urinate.  Baseline:  Goal status: INITIAL   4.  Sit to stand score is </= 15 sec so she is able to safely get out of the chair or get up from the commode.  Baseline:  Goal status: INITIAL   5.  Patient is able to wake up 2 times at night instead of every hour due to increased in pelvic floor  strength and understanding on how to use the urge to void behavioral technique.  Baseline:  Goal status: INITIAL     PLAN:   PT FREQUENCY: 1-2x/week   PT DURATION: 12 weeks   PLANNED INTERVENTIONS: Therapeutic exercises, Therapeutic activity, Neuromuscular re-education, Balance training, Gait training, Patient/Family education, Self Care, Biofeedback, and Manual therapy   PLAN FOR NEXT SESSION: Continue with hip strength: consider adding standing hip exercises to incorporate balance. test sit to stand. Ask about her feeling the urine leak, educate on the urge to void.   Earlie Counts, PT 03/09/22 11:46 AM

## 2022-03-13 ENCOUNTER — Other Ambulatory Visit: Payer: Self-pay | Admitting: Nurse Practitioner

## 2022-03-13 DIAGNOSIS — I10 Essential (primary) hypertension: Secondary | ICD-10-CM

## 2022-03-13 NOTE — Telephone Encounter (Signed)
Patient has request refill on medication Telmisartan '20mg'$ . Patient medication last refilled September 2023. Patient medication has High Risk Warnings. Medications pend and sent to PCP Dewaine Oats [place] American, NP

## 2022-03-14 ENCOUNTER — Ambulatory Visit: Payer: Medicare Other

## 2022-03-14 DIAGNOSIS — R293 Abnormal posture: Secondary | ICD-10-CM

## 2022-03-14 DIAGNOSIS — M6281 Muscle weakness (generalized): Secondary | ICD-10-CM | POA: Diagnosis not present

## 2022-03-14 DIAGNOSIS — R252 Cramp and spasm: Secondary | ICD-10-CM

## 2022-03-14 NOTE — Therapy (Signed)
OUTPATIENT PHYSICAL THERAPY TREATMENT NOTE   Patient Name: [name] MRN: [id number] DOB:[birth year], 88 y.o., female Today's Date: 03/14/2022  PCP: [name], NP  REFERRING PROVIDER: [name], MD    END OF SESSION:   PT End of Session - 03/14/22 1102     Visit Number 5    Date for PT Re-Evaluation 05/18/22    Authorization Type [employer]    Authorization - Visit Number 5    Authorization - Number of Visits 10    Progress Note Due on Visit 10    PT Start Time 7782    PT Stop Time 4235    PT Time Calculation (min) 43 min    Activity Tolerance Patient tolerated treatment well    Behavior During Therapy WFL for tasks assessed/performed             Past Medical History:  Diagnosis Date   Arthritis    Cancer ([place])    left breast cancer    Cataracts, bilateral    removed by surgery   CHF (congestive heart failure) ([place])    PACEMAKER & DEFIB   Complication of anesthesia    hypotensive after back surgery in 2006   Depression    Dyslipidemia    Fainted 04/21/06   AT [facility]   GERD (gastroesophageal reflux disease)    Headache(784.0)    Hearing loss    bilateral hearing aids   HLD (hyperlipidemia)    diet controlled    Hypertension    Hypothyroidism    ICD (implantable cardiac defibrillator) in place    pt has pacer/icd   ICD (implantable cardiac defibrillator), biventricular, in situ    LBBB (left bundle branch block)    Memory loss    Nonischemic cardiomyopathy ([place])    Normal coronary arteries    s/p cardiac cath 2007   Pacemaker    ICD [employer]   Syncope    Systolic CHF [facility])    Vertigo    Wears glasses    Past Surgical History:  Procedure Laterality Date   BACK SURGERY     lumbar fusion    BREAST LUMPECTOMY Left 2008   BREAST SURGERY  2000   LUMP REMOVAL. STAGE 1 CANCER   CARDIAC CATHETERIZATION     CATARACT EXTRACTION     COLONOSCOPY     EYE SURGERY     IMPLANTABLE CARDIOVERTER DEFIBRILLATOR GENERATOR CHANGE N/A  12/18/2012   Procedure: IMPLANTABLE CARDIOVERTER DEFIBRILLATOR GENERATOR CHANGE;  Surgeon: [name], MD;  Location: [facility] CATH LAB;  Service: Cardiovascular;  Laterality: N/A;   JOINT REPLACEMENT  06/14/01   right   LUMBAR FUSION  2006   MASS EXCISION  11/08/2011   Procedure: EXCISION MASS;  Surgeon: [name], MD;  Location: [facility] ORS;  Service: General;  Laterality: Left;  Excision Left Thigh Mass   MASTECTOMY W/ SENTINEL NODE BIOPSY Left 06/04/2019   Procedure: LEFT MASTECTOMY WITH SENTINEL LYMPH NODE BIOPSY;  Surgeon: [name], MD;  Location: Day;  Service: General;  Laterality: Left;   MASTECTOMY, PARTIAL  2008   GOT PACEMAKER AND DEFIB AT THAT TIME   PACEMAKER INSERTION  04/23/06   TOTAL KNEE ARTHROPLASTY  05/17/01   RIGHT KNEE   TOTAL KNEE ARTHROPLASTY Left 11/29/2014   Procedure: TOTAL LEFT KNEE ARTHROPLASTY;  Surgeon: Paralee Cancel, MD;  Location: [facility] ORS;  Service: Orthopedics;  Laterality: Left;   Patient Active Problem List   Diagnosis Date Noted   Sciatica associated with disorder  of multiple sites of spine 03/20/2021   Bipolar II disorder ([place]) 03/20/2021   Chronic pain syndrome 03/20/2021   Dementia without behavioral disturbance ([place]) 02/04/2020   Urinary retention 02/04/2020   Seasonal allergies 02/04/2020   Recurrent major depressive disorder, in partial remission ([place]) 07/17/2019   Status post left mastectomy 07/01/2019   Breast cancer of lower-outer quadrant of left female breast ([place]) 06/04/2019   Candida infection, oral 01/15/2019   Senile purpura ([place]) 04/14/2018   Lumbar post-laminectomy syndrome 12/03/2017   Lumbar spondylosis 12/03/2017   History of back surgery 09/01/2017   Constipation 09/01/2017   Hypothyroidism due to acquired atrophy of thyroid 09/01/2017   Mixed hyperlipidemia 09/01/2017   Age-related osteoporosis without current pathological fracture 09/01/2017   High risk medication use 09/01/2017   Arthritis of hand 05/17/2017   Atherosclerosis  of native arteries of extremity with intermittent claudication ([place]) 05/15/2017   Status post total bilateral knee replacement 12/20/2016   Gait abnormality 07/16/2016   Chronic low back pain 07/16/2016   Mild cognitive impairment 07/16/2016   Wrist pain 05/10/2015   S/P left TKA 11/29/2014   S/P knee replacement 11/29/2014   Spontaneous bruising 08/27/2014   Numbness and tingling in right hand 07/28/2014   Essential tremor 07/28/2014   Dizziness 05/28/2014   Shaky 05/28/2014   Memory loss 05/28/2014   Depression 05/06/2014   Lipoma of left upper thigh 3x5 cm 09/28/2011   Cerebral vascular accident ([place]) 08/09/2011   Syncope 08/09/2011   History of breast cancer [id number], s/p BCT 2008, triple negative 01/26/2011   Chronic L breast pain with chronic recurrent seroma, s/p excisional biopsy 01/12/2010 01/26/2011   Fainted    ICD (implantable cardioverter-defibrillator), biventricular, in situ 01/60/1093   Chronic systolic heart failure ([place]) 08/02/2010   Essential hypertension 08/02/2010   REFERRING DIAG: N39.45 (ICD-10-CM) - Continuous leakage   THERAPY DIAG:  Muscle weakness (generalized)   Cramp and spasm   Other abnormalities of gait and mobility   Other lack of coordination   Rationale for Evaluation and Treatment: Rehabilitation   ONSET DATE: 6 months ago   SUBJECTIVE:                                                                                                                                                                                          Patient reports no new issues.  "Still leaking upon standing" but feeling she is doing good with her exercises.    SUBJECTIVE STATEMENT: Pessary is helping. My pads are just as wet.   PAIN:  Are you having pain? Mild/moderate in Bil buttocks 4-5/10. Worse with prolonged sitting.  PRECAUTIONS: ICD/Pacemaker, left breast cancer   WEIGHT BEARING RESTRICTIONS: No   FALLS:  Has patient fallen in last 6 months? No    LIVING ENVIRONMENT: Lives with: lives with their family     OCCUPATION: retired   PLOF: Independent   PATIENT GOALS: reduce leakage   PERTINENT HISTORY:  Left breast cancer; CHF; Hypothyroidism; Pacemaker; Lumbar fusion; Left knee arthroplasty     URINATION: Pain with urination: No Fully empty bladder: Yes: sometimes, after she is done urinating she feels like there is still some urine remaining Stream: Strong, sometimes has a trickling flow Urgency: Yes:   Frequency: day time voids 4 night times every 2 hours or 1 hour Leakage:  does not feel the leakage Pads: Yes: 4 pads per day, night wears a depends with toilet paper   PREGNANCY: Vaginal deliveries 1     PROLAPSE: Cystocele   and Urethrocele      OBJECTIVE: (objective measures completed at initial evaluation unless otherwise dated)   DIAGNOSTIC FINDINGS:  Pessary fittings     COGNITION: Overall cognitive status: Within functional limits for tasks assessed                          SENSATION: Light touch: Appears intact Proprioception: Appears intact     FUNCTIONAL TESTS:  5 times sit to stand: 20 sec Timed up and go (TUG): 15 sec 03/09/2022: 16 sec, 13 sec, 11 sec   GAIT: Assistive device utilized: Single point cane, when she is going out Level of assistance: Complete Independence Comments: small steps and slowly    POSTURE: No Significant postural limitations   PELVIC ALIGNMENT:     LOWER EXTREMITY ROM:   Passive ROM Right eval Left eval  Hip external rotation 25 45   (Blank rows = not tested)   LOWER EXTREMITY MMT:   MMT Right eval Left eval  Hip extension 3/5 3/5  Hip abduction 3/5 3/5  Hip adduction 3/5 3/5    PALPATION:    External Perineal Exam some redness in the vulva area                             Internal Pelvic Floor tightness in the introitus. She would contract her gluteal more than the vaginal but after tactile cues was able to isolate the vaginal canal.    Patient  confirms identification and approves PT to assess internal pelvic floor and treatment Yes   PELVIC MMT:   MMT eval 03/09/2022  Vaginal 1/5 2/5        TONE: Low tone   PROLAPSE: Had pessary in   TODAY'S TREATMENT:   03/14/22:  Seated ball squeeze 20x Blue loop seated clamshells 2x10 Vc to sit tall throughout Purple ball push downs (ball on knees) vc's to contract pelvic floor 2 x 10 Holding each rep 5 sec Nustep: New model: L5 x 5 min with concurrent discuss of pain & status Sit to stand 2 x 5 with ball squeeze and vc's for contracting PF  Seated LAQ with 2# 2 x 10 Seated March with 2# x 20 alternating Seated Hip ER with 2# x 10 Bridge 2x5 with PF contraction at top, Vc to hold the contraction as she lowers. Supine Bil piriformis stretch: 2x 20 sec Bil; added to HEP  also instructed in seated piriformis stretch for option Added several exercises to HEP and provided handouts  03/09/2022 Manual: Myofascial release:  Fascial release to the urogenital diaphragm and ischiocavernosus Internal pelvic floor techniques:No emotional/communication barriers or cognitive limitation. Patient is motivated to learn. Patient understands and agrees with treatment goals and plan. PT explains patient will be examined in standing, sitting, and lying down to see how their muscles and joints work. When they are ready, they will be asked to remove their underwear so PT can examine their perineum. The patient is also given the option of providing their own chaperone as one is not provided in our facility. The patient also has the right and is explained the right to defer or refuse any part of the evaluation or treatment including the internal exam. With the patient's consent, PT will use one gloved finger to gently assess the muscles of the pelvic floor, seeing how well it contracts and relaxes and if there is muscle symmetry. After, the patient will get dressed and PT and patient will discuss exam findings and  plan of care. PT and patient discuss plan of care, schedule, attendance policy and HEP activities.  Going in the vagina working on the introitus and the perineal body to improve pelvic floor contraction Neuromuscular re-education: Pelvic floor contraction training: Using therapist index finger to work on pelvic floor contraction holding 5 seconds and quick flicks in supine Sitting squeeze ball with pelvic floor contraction Marching in sitting with pelvic floor contraction Exercises: Strengthening: Nustep x 5 min level while assessing patient      03/05/22:  Nustep: New model: L4 x 5 min with concurrent discuss of pain & status Seated ball squeeze 5 sec hold 10x Blue loop seated clamshells 2x10 Vc to sit tall throughout Supine Bil piriformis stretch: 2x 20 sec Bil; added to HEP   Glutes squeezes with PF contraction 3 sec hold 10x; added to HEP Bridge 2x5 with PF contraction at top, Vc to hold the contraction as she lowers.    Sit to stand 3x with Vc to make PF contraction as she stands: stopped at 3 for bathroom break.                                                                                                                          DATE: 02/28/2022  Nustep x 5 min level  Seated hip adduction with purple ball 2 x 10 Seated clam 2 x 10 with green band  Seated ball press into knees 3 x 10 with purple ball  Sit to stand with pelvic floor contraction x 10 Hook lying bridging x 10 Hook lying ball squeeze 2 x 10   DATE: 02/23/2022  EVAL See below     PATIENT EDUCATION:  02/23/2022 Education details: Access Code: [email or web address] Person educated: Patient Education method: Consulting civil engineer, Demonstration, Corporate treasurer cues, Verbal cues, and Handouts Education comprehension: verbalized understanding, returned demonstration, verbal cues required, tactile cues required, and needs further education   HOME EXERCISE PROGRAM:  Access Code: [email or web address] URL: [email or web address] Date:  03/14/2022 Prepared by: Candyce Churn  Exercises - Supine Pelvic Floor  Contraction  - 3 x daily - 7 x weekly - 1 sets - 10 reps - 5 sec hold - Supine Piriformis Stretch with Foot on Ground  - 1 x daily - 7 x weekly - 2 reps - 20 hold - Hooklying Gluteal Sets  - 1 x daily - 7 x weekly - 10 reps - 5 hold - Pelvic Floor Contractions in Hooklying with Adduction  - 1 x daily - 7 x weekly - 3 sets - 10 reps - Supine Bridge with Pelvic Floor Contraction  - 1 x daily - 7 x weekly - 3 sets - 10 reps - Seated Long Arc Quad  - 1 x daily - 7 x weekly - 2 sets - 10 reps - Seated March  - 1 x daily - 7 x weekly - 2 sets - 10 reps - Seated Abdominal Press into [employer]  - 1 x daily - 7 x weekly - 1 sets - 10 reps  ASSESSMENT:   CLINICAL IMPRESSION: Ms. Stoffel is progressing appropriately.  She is gaining strength according to objective findings last session.  She is able to do increased reps before leakage on sit to stand.  She is compliant with her HEP.  She should continue to improve.  Patient will benefit from skilled therapy to improve strength and coordination to reduce leakage and improve balance as she is walking to the commode.    OBJECTIVE IMPAIRMENTS: decreased activity tolerance, decreased coordination, decreased endurance, decreased mobility, difficulty walking, decreased strength, and increased fascial restrictions.    ACTIVITY LIMITATIONS: standing, transfers, continence, toileting, and locomotion level   PARTICIPATION LIMITATIONS: shopping and community activity   PERSONAL FACTORS: Age, Fitness, Past/current experiences, and 1-2 comorbidities: Left breast cancer; CHF; Hypothyroidism; Pacemaker; Lumbar fusion; Left knee arthroplasty  are also affecting patient's functional outcome.    REHAB POTENTIAL: Good   CLINICAL DECISION MAKING: Evolving/moderate complexity   EVALUATION COMPLEXITY: Moderate     GOALS: Goals reviewed with patient? Yes   SHORT TERM GOALS: Target date:  03/23/2022   Patient independent with initial HEP for pelvic floor contraction.  Baseline: Goal status: Goal met 03/05/22   2.  Patient is able to feel the urinary leakage due to improve pelvic floor tone.  Baseline:  Goal status: INITIAL   3.  Patient educated on the urge to void to be able to walk to the bathroom slowl Baseline:  Goal status: INITIAL   4.  Timed up and go </= 14 sec Baseline:  Goal status: Met 03/09/2022   5.  Sit to stand </- 18 sec  Baseline:  Goal status: INITIAL     LONG TERM GOALS: Target date: 05/18/2021   Patient independent with advanced hep for pelvic floor strength and balance exercises.  Baseline:  Goal status: INITIAL   2.  Patient reports her urinary leakage is </= 50% due to improved pelvic floor strength >/ 3/5 Baseline:  Goal status: INITIAL   3.  Time up and go test is </= 13 sec. So she is able to walk to the commode safely when she has the urge to urinate.  Baseline:  Goal status: INITIAL   4.  Sit to stand score is </= 15 sec so she is able to safely get out of the chair or get up from the commode.  Baseline:  Goal status: INITIAL   5.  Patient is able to wake up 2 times at night instead of every hour due to increased in pelvic floor strength  and understanding on how to use the urge to void behavioral technique.  Baseline:  Goal status: INITIAL     PLAN:   PT FREQUENCY: 1-2x/week   PT DURATION: 12 weeks   PLANNED INTERVENTIONS: Therapeutic exercises, Therapeutic activity, Neuromuscular re-education, Balance training, Gait training, Patient/Family education, Self Care, Biofeedback, and Manual therapy   PLAN FOR NEXT SESSION: Continue with hip strength: consider adding standing hip exercises to incorporate balance. test sit to stand. Ask about her feeling the urine leak, educate on the urge to void.   Anderson Malta B. Bernadean Saling, PT 03/14/22 3:02 PM  [facility] [address] [place], [place]  [address] Phone # [phone] Fax [phone]

## 2022-03-19 ENCOUNTER — Encounter: Payer: Self-pay | Admitting: Physical Therapy

## 2022-03-19 ENCOUNTER — Ambulatory Visit: Payer: Medicare Other | Admitting: Physical Therapy

## 2022-03-19 DIAGNOSIS — R252 Cramp and spasm: Secondary | ICD-10-CM

## 2022-03-19 DIAGNOSIS — M6281 Muscle weakness (generalized): Secondary | ICD-10-CM

## 2022-03-19 NOTE — Patient Instructions (Signed)
Urge Incontinence  Ideal urination frequency is every 2-4 wakeful hours, which equates to 5-8 times within a 24-hour period.   Urge incontinence is leakage that occurs when the bladder muscle contracts, creating a sudden need to go before getting to the bathroom.   Going too often when your bladder isn't actually full can disrupt the body's automatic signals to store and hold urine longer, which will increase urgency/frequency.  In this case, the bladder "is running the show" and strategies can be learned to retrain this pattern.   One should be able to control the first urge to urinate, at around 138m.  The bladder can hold up to a "grande latte," or 40101m To help you gain control, practice the Urge Drill below when urgency strikes.  This drill will help retrain your bladder signals and allow you to store and hold urine longer.  The overall goal is to stretch out your time between voids to reach a more manageable voiding schedule.    Practice your "quick flicks" often throughout the day (each waking hour) even when you don't need feel the urge to go.  This will help strengthen your pelvic floor muscles, making them more effective in controlling leakage.  Urge Drill  When you feel an urge to go, follow these steps to regain control: Stop what you are doing and be still Take one deep breath, directing your air into your abdomen Think an affirming thought, such as "I've got this." Do 5 quick flicks of your pelvic floor 5 heel raises in sitting or standing Walk with control to the bathroom to void, or delay voiding While walking to the bathroom and get the  urge again repeat as above.  [id number] [id number] # [phone]

## 2022-03-19 NOTE — Therapy (Signed)
OUTPATIENT PHYSICAL THERAPY TREATMENT NOTE   Patient Name: [name] MRN: [id number] DOB:[birth year], 88 y.o., female Today's Date: 03/19/2022  PCP: [name], NP  REFERRING PROVIDER: [name], MD   END OF SESSION:   PT End of Session - 03/19/22 0757     Visit Number 6    Date for PT Re-Evaluation 05/18/22    Authorization Type [employer]    Authorization - Visit Number 6    Authorization - Number of Visits 10    PT Start Time 0800    PT Stop Time 0840    PT Time Calculation (min) 40 min    Activity Tolerance Patient tolerated treatment well    Behavior During Therapy WFL for tasks assessed/performed             Past Medical History:  Diagnosis Date   Arthritis    Cancer ([place])    left breast cancer    Cataracts, bilateral    removed by surgery   CHF (congestive heart failure) ([place])    PACEMAKER & DEFIB   Complication of anesthesia    hypotensive after back surgery in 2006   Depression    Dyslipidemia    Fainted 04/21/06   AT [facility]   GERD (gastroesophageal reflux disease)    Headache(784.0)    Hearing loss    bilateral hearing aids   HLD (hyperlipidemia)    diet controlled    Hypertension    Hypothyroidism    ICD (implantable cardiac defibrillator) in place    pt has pacer/icd   ICD (implantable cardiac defibrillator), biventricular, in situ    LBBB (left bundle branch block)    Memory loss    Nonischemic cardiomyopathy ([place])    Normal coronary arteries    s/p cardiac cath 2007   Pacemaker    ICD [employer]   Syncope    Systolic CHF [facility])    Vertigo    Wears glasses    Past Surgical History:  Procedure Laterality Date   BACK SURGERY     lumbar fusion    BREAST LUMPECTOMY Left 2008   BREAST SURGERY  2000   LUMP REMOVAL. STAGE 1 CANCER   CARDIAC CATHETERIZATION     CATARACT EXTRACTION     COLONOSCOPY     EYE SURGERY     IMPLANTABLE CARDIOVERTER DEFIBRILLATOR GENERATOR CHANGE N/A 12/18/2012   Procedure: IMPLANTABLE  CARDIOVERTER DEFIBRILLATOR GENERATOR CHANGE;  Surgeon: [name], MD;  Location: [facility] CATH LAB;  Service: Cardiovascular;  Laterality: N/A;   JOINT REPLACEMENT  06/14/01   right   LUMBAR FUSION  2006   MASS EXCISION  11/08/2011   Procedure: EXCISION MASS;  Surgeon: [name], MD;  Location: [facility];  Service: General;  Laterality: Left;  Excision Left Thigh Mass   MASTECTOMY W/ SENTINEL NODE BIOPSY Left 06/04/2019   Procedure: LEFT MASTECTOMY WITH SENTINEL LYMPH NODE BIOPSY;  Surgeon: [name], MD;  Location: [facility];  Service: General;  Laterality: Left;   MASTECTOMY, PARTIAL  2008   GOT PACEMAKER AND DEFIB AT THAT TIME   PACEMAKER INSERTION  04/23/06   TOTAL KNEE ARTHROPLASTY  05/17/01   RIGHT KNEE   TOTAL KNEE ARTHROPLASTY Left 11/29/2014   Procedure: TOTAL LEFT KNEE ARTHROPLASTY;  Surgeon: Paralee Cancel, MD;  Location: [facility];  Service: Orthopedics;  Laterality: Left;   Patient Active Problem List   Diagnosis Date Noted   Sciatica associated with disorder of multiple sites of spine 03/20/2021   Bipolar II  disorder ([place]) 03/20/2021   Chronic pain syndrome 03/20/2021   Dementia without behavioral disturbance ([place]) 02/04/2020   Urinary retention 02/04/2020   Seasonal allergies 02/04/2020   Recurrent major depressive disorder, in partial remission ([place]) 07/17/2019   Status post left mastectomy 07/01/2019   Breast cancer of lower-outer quadrant of left female breast ([place]) 06/04/2019   Candida infection, oral 01/15/2019   Senile purpura ([place]) 04/14/2018   Lumbar post-laminectomy syndrome 12/03/2017   Lumbar spondylosis 12/03/2017   History of back surgery 09/01/2017   Constipation 09/01/2017   Hypothyroidism due to acquired atrophy of thyroid 09/01/2017   Mixed hyperlipidemia 09/01/2017   Age-related osteoporosis without current pathological fracture 09/01/2017   High risk medication use 09/01/2017   Arthritis of hand 05/17/2017   Atherosclerosis of native arteries of extremity with  intermittent claudication ([place]) 05/15/2017   Status post total bilateral knee replacement 12/20/2016   Gait abnormality 07/16/2016   Chronic low back pain 07/16/2016   Mild cognitive impairment 07/16/2016   Wrist pain 05/10/2015   S/P left TKA 11/29/2014   S/P knee replacement 11/29/2014   Spontaneous bruising 08/27/2014   Numbness and tingling in right hand 07/28/2014   Essential tremor 07/28/2014   Dizziness 05/28/2014   Shaky 05/28/2014   Memory loss 05/28/2014   Depression 05/06/2014   Lipoma of left upper thigh 3x5 cm 09/28/2011   Cerebral vascular accident ([place]) 08/09/2011   Syncope 08/09/2011   History of breast cancer [id number], s/p BCT 2008, triple negative 01/26/2011   Chronic L breast pain with chronic recurrent seroma, s/p excisional biopsy 01/12/2010 01/26/2011   Fainted    ICD (implantable cardioverter-defibrillator), biventricular, in situ 73/41/9379   Chronic systolic heart failure ([place]) 08/02/2010   Essential hypertension 08/02/2010   REFERRING DIAG: N39.45 (ICD-10-CM) - Continuous leakage   THERAPY DIAG:  Muscle weakness (generalized)   Cramp and spasm   Other abnormalities of gait and mobility   Other lack of coordination   Rationale for Evaluation and Treatment: Rehabilitation   ONSET DATE: 6 months ago   SUBJECTIVE:                                                                                                                                                                                            SUBJECTIVE STATEMENT: Pessary is helping. I feel good. No change in pad wetness. Patient feels her balance is better. I feel steadier on my feet when walking.  I am able to feel the urinary leakage now.    PAIN:  03/19/2022; Are you having pain? Mild/moderate in Bil buttocks 4-5/10 as she goes from sit to stand . Worse  with prolonged sitting. Pain will decrease when she is standing for 5 minutes.    PRECAUTIONS: ICD/Pacemaker, left breast cancer   WEIGHT  BEARING RESTRICTIONS: No   FALLS:  Has patient fallen in last 6 months? No   LIVING ENVIRONMENT: Lives with: lives with their family     OCCUPATION: retired   PLOF: Independent   PATIENT GOALS: reduce leakage   PERTINENT HISTORY:  Left breast cancer; CHF; Hypothyroidism; Pacemaker; Lumbar fusion; Left knee arthroplasty     URINATION: Pain with urination: No Fully empty bladder: Yes: sometimes, after she is done urinating she feels like there is still some urine remaining Stream: Strong, sometimes has a trickling flow Urgency: Yes:   Frequency: day time voids 4 night times every 2 hours or 1 hour Leakage:  does not feel the leakage Pads: Yes: 4 pads per day, night wears a depends with toilet paper   PREGNANCY: Vaginal deliveries 1     PROLAPSE: Cystocele   and Urethrocele      OBJECTIVE: (objective measures completed at initial evaluation unless otherwise dated)   DIAGNOSTIC FINDINGS:  Pessary fittings     COGNITION: Overall cognitive status: Within functional limits for tasks assessed                          SENSATION: Light touch: Appears intact Proprioception: Appears intact     FUNCTIONAL TESTS:  5 times sit to stand: 20 sec; 03/19/2022 13 sec but felt urinary leakage while doing the task.  Timed up and go (TUG): 15 sec 03/09/2022: 16 sec, 13 sec, 11 sec   GAIT: Assistive device utilized: Single point cane, when she is going out Level of assistance: Complete Independence Comments: small steps and slowly    POSTURE: No Significant postural limitations   PELVIC ALIGNMENT:     LOWER EXTREMITY ROM:   Passive ROM Right eval Left eval  Hip external rotation 25 45   (Blank rows = not tested)   LOWER EXTREMITY MMT:   MMT Right eval Left eval  Hip extension 3/5 3/5  Hip abduction 3/5 3/5  Hip adduction 3/5 3/5    PALPATION:    External Perineal Exam some redness in the vulva area                             Internal Pelvic Floor tightness  in the introitus. She would contract her gluteal more than the vaginal but after tactile cues was able to isolate the vaginal canal.    Patient confirms identification and approves PT to assess internal pelvic floor and treatment Yes   PELVIC MMT:   MMT eval 03/09/2022  Vaginal 1/5 2/5        TONE: Low tone   PROLAPSE: Had pessary in   TODAY'S TREATMENT:   03/19/2022  Neuromuscular re-education: Pelvic floor contraction training: Sitting on physioball with pelvic floor contraction feeling the lift and working on not holding her breath.  Sitting on physioball and move pelvis in side to side and tilting with pelvic floor contraction Sit to stand from the physioball with pelvic floor contraction  Sit to stand from a chair with pelvic floor contraction and breathing out.  Educated patient with urge to void and she was able to return demonstration.  Exercises: Strengthening: Nustep: New model: L5 x 5 min with concurrent discuss of pain & status    03/14/22:  Seated ball squeeze 20x  Blue loop seated clamshells 2x10 Vc to sit tall throughout Purple ball push downs (ball on knees) vc's to contract pelvic floor 2 x 10 Holding each rep 5 sec Nustep: New model: L5 x 5 min with concurrent discuss of pain & status Sit to stand 2 x 5 with ball squeeze and vc's for contracting PF  Seated LAQ with 2# 2 x 10 Seated March with 2# x 20 alternating Seated Hip ER with 2# x 10 Bridge 2x5 with PF contraction at top, Vc to hold the contraction as she lowers. Supine Bil piriformis stretch: 2x 20 sec Bil; added to HEP  also instructed in seated piriformis stretch for option Added several exercises to HEP and provided handouts   03/09/2022 Manual: Myofascial release: Fascial release to the urogenital diaphragm and ischiocavernosus Internal pelvic floor techniques:No emotional/communication barriers or cognitive limitation. Patient is motivated to learn. Patient understands and agrees with treatment  goals and plan. PT explains patient will be examined in standing, sitting, and lying down to see how their muscles and joints work. When they are ready, they will be asked to remove their underwear so PT can examine their perineum. The patient is also given the option of providing their own chaperone as one is not provided in our facility. The patient also has the right and is explained the right to defer or refuse any part of the evaluation or treatment including the internal exam. With the patient's consent, PT will use one gloved finger to gently assess the muscles of the pelvic floor, seeing how well it contracts and relaxes and if there is muscle symmetry. After, the patient will get dressed and PT and patient will discuss exam findings and plan of care. PT and patient discuss plan of care, schedule, attendance policy and HEP activities.  Going in the vagina working on the introitus and the perineal body to improve pelvic floor contraction Neuromuscular re-education: Pelvic floor contraction training: Using therapist index finger to work on pelvic floor contraction holding 5 seconds and quick flicks in supine Sitting squeeze ball with pelvic floor contraction Marching in sitting with pelvic floor contraction Exercises: Strengthening: Nustep x 5 min level while assessing patient           PATIENT EDUCATION:  02/23/2022 Education details: Access Code: [email or web address] Person educated: Patient Education method: Consulting civil engineer, Demonstration, Corporate treasurer cues, Verbal cues, and Handouts Education comprehension: verbalized understanding, returned demonstration, verbal cues required, tactile cues required, and needs further education   HOME EXERCISE PROGRAM:  Access Code: [email or web address] URL: [email or web address] Date: 03/14/2022 Prepared by: Candyce Churn   Exercises - Supine Pelvic Floor Contraction  - 3 x daily - 7 x weekly - 1 sets - 10 reps - 5 sec hold - Supine Piriformis Stretch with  Foot on Ground  - 1 x daily - 7 x weekly - 2 reps - 20 hold - Hooklying Gluteal Sets  - 1 x daily - 7 x weekly - 10 reps - 5 hold - Pelvic Floor Contractions in Hooklying with Adduction  - 1 x daily - 7 x weekly - 3 sets - 10 reps - Supine Bridge with Pelvic Floor Contraction  - 1 x daily - 7 x weekly - 3 sets - 10 reps - Seated Long Arc Quad  - 1 x daily - 7 x weekly - 2 sets - 10 reps - Seated March  - 1 x daily - 7 x weekly - 2 sets - 10 reps - Seated Abdominal Press into [employer]  -  1 x daily - 7 x weekly - 1 sets - 10 reps   ASSESSMENT:   CLINICAL IMPRESSION: Ms. Birkhead is progressing appropriately.  Patient is able to feel the pelvic floor contraction now and when she is leaking urine. Patient has difficulty with not contracting her gluteal with sit to stand and isolating her pelvic floor contraction. She is able to do her sit to stand in 13 seconds.  Patient will benefit from skilled therapy to improve strength and coordination to reduce leakage and improve balance as she is walking to the commode.    OBJECTIVE IMPAIRMENTS: decreased activity tolerance, decreased coordination, decreased endurance, decreased mobility, difficulty walking, decreased strength, and increased fascial restrictions.    ACTIVITY LIMITATIONS: standing, transfers, continence, toileting, and locomotion level   PARTICIPATION LIMITATIONS: shopping and community activity   PERSONAL FACTORS: Age, Fitness, Past/current experiences, and 1-2 comorbidities: Left breast cancer; CHF; Hypothyroidism; Pacemaker; Lumbar fusion; Left knee arthroplasty  are also affecting patient's functional outcome.    REHAB POTENTIAL: Good   CLINICAL DECISION MAKING: Evolving/moderate complexity   EVALUATION COMPLEXITY: Moderate     GOALS: Goals reviewed with patient? Yes   SHORT TERM GOALS: Target date: 03/23/2022   Patient independent with initial HEP for pelvic floor contraction.  Baseline: Goal status: Goal met 03/05/22   2.   Patient is able to feel the urinary leakage due to improve pelvic floor tone.  Baseline:  Goal status: Met 03/19/2022   3.  Patient educated on the urge to void to be able to walk to the bathroom slowl Baseline:  Goal status: INITIAL   4.  Timed up and go </= 14 sec Baseline:  Goal status: Met 03/09/2022   5.  Sit to stand </- 18 sec  Baseline:  Goal status: Met 03/19/2020     LONG TERM GOALS: Target date: 05/18/2021   Patient independent with advanced hep for pelvic floor strength and balance exercises.  Baseline:  Goal status: INITIAL   2.  Patient reports her urinary leakage is </= 50% due to improved pelvic floor strength >/ 3/5 Baseline:  Goal status: INITIAL   3.  Time up and go test is </= 13 sec. So she is able to walk to the commode safely when she has the urge to urinate.  Baseline:  Goal status: INITIAL   4.  Sit to stand score is </= 15 sec so she is able to safely get out of the chair or get up from the commode.  Baseline:  Goal status: INITIAL   5.  Patient is able to wake up 2 times at night instead of every hour due to increased in pelvic floor strength and understanding on how to use the urge to void behavioral technique.  Baseline:  Goal status: INITIAL     PLAN:   PT FREQUENCY: 1-2x/week   PT DURATION: 12 weeks   PLANNED INTERVENTIONS: Therapeutic exercises, Therapeutic activity, Neuromuscular re-education, Balance training, Gait training, Patient/Family education, Self Care, Biofeedback, and Manual therapy   PLAN FOR NEXT SESSION: Continue with hip strength: consider adding standing hip exercises to incorporate balance. Sit to stand without gluteal contraction just pelvic floor contraction.   Earlie Counts, PT 03/19/22 8:42 AM

## 2022-03-21 ENCOUNTER — Ambulatory Visit: Payer: Medicare Other

## 2022-03-21 DIAGNOSIS — R252 Cramp and spasm: Secondary | ICD-10-CM

## 2022-03-21 DIAGNOSIS — R293 Abnormal posture: Secondary | ICD-10-CM

## 2022-03-21 DIAGNOSIS — M6281 Muscle weakness (generalized): Secondary | ICD-10-CM

## 2022-03-21 DIAGNOSIS — R262 Difficulty in walking, not elsewhere classified: Secondary | ICD-10-CM

## 2022-03-21 NOTE — Therapy (Signed)
OUTPATIENT PHYSICAL THERAPY TREATMENT NOTE   Patient Name: [name] MRN: [id number] DOB:[birth year], 88 y.o., female Today's Date: 03/21/2022  PCP: [name], NP  REFERRING PROVIDER: [name], MD   END OF SESSION:   PT End of Session - 03/21/22 1112     Visit Number 7    Date for PT Re-Evaluation 05/18/22    Authorization Type [employer]    Authorization - Visit Number 7    Authorization - Number of Visits 10    Progress Note Due on Visit 10    PT Start Time 1103    PT Stop Time 2633    PT Time Calculation (min) 42 min    Activity Tolerance Patient tolerated treatment well    Behavior During Therapy WFL for tasks assessed/performed             Past Medical History:  Diagnosis Date   Arthritis    Cancer ([place])    left breast cancer    Cataracts, bilateral    removed by surgery   CHF (congestive heart failure) ([place])    PACEMAKER & DEFIB   Complication of anesthesia    hypotensive after back surgery in 2006   Depression    Dyslipidemia    Fainted 04/21/06   AT [facility]   GERD (gastroesophageal reflux disease)    Headache(784.0)    Hearing loss    bilateral hearing aids   HLD (hyperlipidemia)    diet controlled    Hypertension    Hypothyroidism    ICD (implantable cardiac defibrillator) in place    pt has pacer/icd   ICD (implantable cardiac defibrillator), biventricular, in situ    LBBB (left bundle branch block)    Memory loss    Nonischemic cardiomyopathy ([place])    Normal coronary arteries    s/p cardiac cath 2007   Pacemaker    ICD [employer]   Syncope    Systolic CHF [facility])    Vertigo    Wears glasses    Past Surgical History:  Procedure Laterality Date   BACK SURGERY     lumbar fusion    BREAST LUMPECTOMY Left 2008   BREAST SURGERY  2000   LUMP REMOVAL. STAGE 1 CANCER   CARDIAC CATHETERIZATION     CATARACT EXTRACTION     COLONOSCOPY     EYE SURGERY     IMPLANTABLE CARDIOVERTER DEFIBRILLATOR GENERATOR CHANGE N/A  12/18/2012   Procedure: IMPLANTABLE CARDIOVERTER DEFIBRILLATOR GENERATOR CHANGE;  Surgeon: [name], MD;  Location: [facility] CATH LAB;  Service: Cardiovascular;  Laterality: N/A;   JOINT REPLACEMENT  06/14/01   right   LUMBAR FUSION  2006   MASS EXCISION  11/08/2011   Procedure: EXCISION MASS;  Surgeon: [name], MD;  Location: [facility];  Service: General;  Laterality: Left;  Excision Left Thigh Mass   MASTECTOMY W/ SENTINEL NODE BIOPSY Left 06/04/2019   Procedure: LEFT MASTECTOMY WITH SENTINEL LYMPH NODE BIOPSY;  Surgeon: [name], MD;  Location: [facility];  Service: General;  Laterality: Left;   MASTECTOMY, PARTIAL  2008   GOT PACEMAKER AND DEFIB AT THAT TIME   PACEMAKER INSERTION  04/23/06   TOTAL KNEE ARTHROPLASTY  05/17/01   RIGHT KNEE   TOTAL KNEE ARTHROPLASTY Left 11/29/2014   Procedure: TOTAL LEFT KNEE ARTHROPLASTY;  Surgeon: Paralee Cancel, MD;  Location: [facility];  Service: Orthopedics;  Laterality: Left;   Patient Active Problem List   Diagnosis Date Noted   Sciatica associated with disorder of  multiple sites of spine 03/20/2021   Bipolar II disorder ([place]) 03/20/2021   Chronic pain syndrome 03/20/2021   Dementia without behavioral disturbance ([place]) 02/04/2020   Urinary retention 02/04/2020   Seasonal allergies 02/04/2020   Recurrent major depressive disorder, in partial remission ([place]) 07/17/2019   Status post left mastectomy 07/01/2019   Breast cancer of lower-outer quadrant of left female breast ([place]) 06/04/2019   Candida infection, oral 01/15/2019   Senile purpura ([place]) 04/14/2018   Lumbar post-laminectomy syndrome 12/03/2017   Lumbar spondylosis 12/03/2017   History of back surgery 09/01/2017   Constipation 09/01/2017   Hypothyroidism due to acquired atrophy of thyroid 09/01/2017   Mixed hyperlipidemia 09/01/2017   Age-related osteoporosis without current pathological fracture 09/01/2017   High risk medication use 09/01/2017   Arthritis of hand 05/17/2017   Atherosclerosis  of native arteries of extremity with intermittent claudication ([place]) 05/15/2017   Status post total bilateral knee replacement 12/20/2016   Gait abnormality 07/16/2016   Chronic low back pain 07/16/2016   Mild cognitive impairment 07/16/2016   Wrist pain 05/10/2015   S/P left TKA 11/29/2014   S/P knee replacement 11/29/2014   Spontaneous bruising 08/27/2014   Numbness and tingling in right hand 07/28/2014   Essential tremor 07/28/2014   Dizziness 05/28/2014   Shaky 05/28/2014   Memory loss 05/28/2014   Depression 05/06/2014   Lipoma of left upper thigh 3x5 cm 09/28/2011   Cerebral vascular accident ([place]) 08/09/2011   Syncope 08/09/2011   History of breast cancer [id number], s/p BCT 2008, triple negative 01/26/2011   Chronic L breast pain with chronic recurrent seroma, s/p excisional biopsy 01/12/2010 01/26/2011   Fainted    ICD (implantable cardioverter-defibrillator), biventricular, in situ 56/31/4970   Chronic systolic heart failure ([place]) 08/02/2010   Essential hypertension 08/02/2010   REFERRING DIAG: N39.45 (ICD-10-CM) - Continuous leakage   THERAPY DIAG:  Muscle weakness (generalized)   Cramp and spasm   Other abnormalities of gait and mobility   Other lack of coordination   Rationale for Evaluation and Treatment: Rehabilitation   ONSET DATE: 6 months ago   SUBJECTIVE:                                                                                                                                                                                            SUBJECTIVE STATEMENT: Patient reports she is occasionally able to get to the bathroom when she feels the urge but "if it starts coming, I can't stop it".  I feel a lot more stable when I'm walking.      PAIN:  03/19/2022; Are you having pain? Mild/moderate in Bil  buttocks 4-5/10 as she goes from sit to stand . Worse with prolonged sitting. Pain will decrease when she is standing for 5 minutes.    PRECAUTIONS:  ICD/Pacemaker, left breast cancer   WEIGHT BEARING RESTRICTIONS: No   FALLS:  Has patient fallen in last 6 months? No   LIVING ENVIRONMENT: Lives with: lives with their family     OCCUPATION: retired   PLOF: Independent   PATIENT GOALS: reduce leakage   PERTINENT HISTORY:  Left breast cancer; CHF; Hypothyroidism; Pacemaker; Lumbar fusion; Left knee arthroplasty     URINATION: Pain with urination: No Fully empty bladder: Yes: sometimes, after she is done urinating she feels like there is still some urine remaining Stream: Strong, sometimes has a trickling flow Urgency: Yes:   Frequency: day time voids 4 night times every 2 hours or 1 hour Leakage:  does not feel the leakage Pads: Yes: 4 pads per day, night wears a depends with toilet paper   PREGNANCY: Vaginal deliveries 1     PROLAPSE: Cystocele   and Urethrocele      OBJECTIVE: (objective measures completed at initial evaluation unless otherwise dated)   DIAGNOSTIC FINDINGS:  Pessary fittings     COGNITION: Overall cognitive status: Within functional limits for tasks assessed                          SENSATION: Light touch: Appears intact Proprioception: Appears intact     FUNCTIONAL TESTS:  5 times sit to stand: 20 sec; 03/19/2022 13 sec but felt urinary leakage while doing the task.  Timed up and go (TUG): 15 sec 03/09/2022: 16 sec, 13 sec, 11 sec   GAIT: Assistive device utilized: Single point cane, when she is going out Level of assistance: Complete [place] Comments: small steps and slowly    POSTURE: No Significant postural limitations   PELVIC ALIGNMENT:     LOWER EXTREMITY ROM:   Passive ROM Right eval Left eval  Hip external rotation 25 45   (Blank rows = not tested)   LOWER EXTREMITY MMT:   MMT Right eval Left eval  Hip extension 3/5 3/5  Hip abduction 3/5 3/5  Hip adduction 3/5 3/5    PALPATION:    External Perineal Exam some redness in the vulva area                              Internal Pelvic Floor tightness in the introitus. She would contract her gluteal more than the vaginal but after tactile cues was able to isolate the vaginal canal.    Patient confirms identification and approves PT to assess internal pelvic floor and treatment Yes   PELVIC MMT:   MMT eval 03/09/2022  Vaginal 1/5 2/5        TONE: Low tone   PROLAPSE: Had pessary in   TODAY'S TREATMENT:   03/21/2022 Nustep x 5 min level 2 Seated ball squeeze x 20 Sit to stand with ball squeeze x 10 Seated clam with green loop 2 x 10  Seated hamstring curl with yellow band 2 x 10 each LE Seated LAQ with 2 lbs 2 x 10 each Seated mini sit ups holding red plyo ball 2 x 10 Educated patient on mentally communicating with her body using the "calm down bladder" feedback method when she feels urgency  03/19/2022  Neuromuscular re-education: Pelvic floor contraction training: Sitting on physioball with pelvic floor contraction  feeling the lift and working on not holding her breath.  Sitting on physioball and move pelvis in side to side and tilting with pelvic floor contraction Sit to stand from the physioball with pelvic floor contraction  Sit to stand from a chair with pelvic floor contraction and breathing out.  Educated patient with urge to void and she was able to return demonstration.  Exercises: Strengthening: Nustep: New model: L5 x 5 min with concurrent discuss of pain & status    03/14/22:  Seated ball squeeze 20x Blue loop seated clamshells 2x10 Vc to sit tall throughout Purple ball push downs (ball on knees) vc's to contract pelvic floor 2 x 10 Holding each rep 5 sec Nustep: New model: L5 x 5 min with concurrent discuss of pain & status Sit to stand 2 x 5 with ball squeeze and vc's for contracting PF  Seated LAQ with 2# 2 x 10 Seated March with 2# x 20 alternating Seated Hip ER with 2# x 10 Bridge 2x5 with PF contraction at top, Vc to hold the contraction as she  lowers. Supine Bil piriformis stretch: 2x 20 sec Bil; added to HEP  also instructed in seated piriformis stretch for option Added several exercises to HEP and provided handouts      PATIENT EDUCATION:  02/23/2022 Education details: Access Code: [email or web address] Person educated: Patient Education method: Consulting civil engineer, Demonstration, Tactile cues, Verbal cues, and Handouts Education comprehension: verbalized understanding, returned demonstration, verbal cues required, tactile cues required, and needs further education   HOME EXERCISE PROGRAM:  Access Code: [email or web address] URL: [email or web address] Date: 03/14/2022 Prepared by: Candyce Churn   Exercises - Supine Pelvic Floor Contraction  - 3 x daily - 7 x weekly - 1 sets - 10 reps - 5 sec hold - Supine Piriformis Stretch with Foot on Ground  - 1 x daily - 7 x weekly - 2 reps - 20 hold - Hooklying Gluteal Sets  - 1 x daily - 7 x weekly - 10 reps - 5 hold - Pelvic Floor Contractions in Hooklying with Adduction  - 1 x daily - 7 x weekly - 3 sets - 10 reps - Supine Bridge with Pelvic Floor Contraction  - 1 x daily - 7 x weekly - 3 sets - 10 reps - Seated Long Arc Quad  - 1 x daily - 7 x weekly - 2 sets - 10 reps - Seated March  - 1 x daily - 7 x weekly - 2 sets - 10 reps - Seated Abdominal Press into [employer]  - 1 x daily - 7 x weekly - 1 sets - 10 reps   ASSESSMENT:   CLINICAL IMPRESSION: Ms. Diego did very well today.  Upon standing after last exercise and having educated patient on mental control of her bladder and calming techniques, she felt the urge to go to the bathroom.  Patient used the bladder calming method and was able to reach the commode without leakage per her report.   Patient will benefit from skilled therapy to improve strength and coordination to reduce leakage and improve balance as she is walking to the commode.    OBJECTIVE IMPAIRMENTS: decreased activity tolerance, decreased coordination, decreased endurance,  decreased mobility, difficulty walking, decreased strength, and increased fascial restrictions.    ACTIVITY LIMITATIONS: standing, transfers, continence, toileting, and locomotion level   PARTICIPATION LIMITATIONS: shopping and community activity   PERSONAL FACTORS: Age, Fitness, Past/current experiences, and 1-2 comorbidities: Left breast cancer; CHF; Hypothyroidism; Pacemaker; Lumbar fusion; Left knee arthroplasty  are also affecting patient's functional outcome.    REHAB POTENTIAL: Good   CLINICAL DECISION MAKING: Evolving/moderate complexity   EVALUATION COMPLEXITY: Moderate     GOALS: Goals reviewed with patient? Yes   SHORT TERM GOALS: Target date: 03/23/2022   Patient independent with initial HEP for pelvic floor contraction.  Baseline: Goal status: Goal met 03/05/22   2.  Patient is able to feel the urinary leakage due to improve pelvic floor tone.  Baseline:  Goal status: Met 03/19/2022   3.  Patient educated on the urge to void to be able to walk to the bathroom slowl Baseline:  Goal status: INITIAL   4.  Timed up and go </= 14 sec Baseline:  Goal status: Met 03/09/2022   5.  Sit to stand </- 18 sec  Baseline:  Goal status: Met 03/19/2020     LONG TERM GOALS: Target date: 05/18/2021   Patient independent with advanced hep for pelvic floor strength and balance exercises.  Baseline:  Goal status: INITIAL   2.  Patient reports her urinary leakage is </= 50% due to improved pelvic floor strength >/ 3/5 Baseline:  Goal status: INITIAL   3.  Time up and go test is </= 13 sec. So she is able to walk to the commode safely when she has the urge to urinate.  Baseline:  Goal status: INITIAL   4.  Sit to stand score is </= 15 sec so she is able to safely get out of the chair or get up from the commode.  Baseline:  Goal status: INITIAL   5.  Patient is able to wake up 2 times at night instead of every hour due to increased in pelvic floor strength and  understanding on how to use the urge to void behavioral technique.  Baseline:  Goal status: INITIAL     PLAN:   PT FREQUENCY: 1-2x/week   PT DURATION: 12 weeks   PLANNED INTERVENTIONS: Therapeutic exercises, Therapeutic activity, Neuromuscular re-education, Balance training, Gait training, Patient/Family education, Self Care, Biofeedback, and Manual therapy   PLAN FOR NEXT SESSION: Continue with hip strength: consider adding standing hip exercises to incorporate balance. Sit to stand without gluteal contraction just pelvic floor contraction.   Anderson Malta B. Maven [place]ander, PT 03/21/22 1:05 PM  [facility] [address], [place] [address], [place] [address] Phone # [phone] Fax [phone]

## 2022-03-22 NOTE — Therapy (Deleted)
OUTPATIENT PHYSICAL THERAPY TREATMENT NOTE   Patient Name: [name] MRN: [id number] DOB:1934-02-06, 88 y.o., female Today's Date: 03/22/2022  PCP: [name], NP  REFERRING PROVIDER: [name], MD   END OF SESSION:     Past Medical History:  Diagnosis Date   Arthritis    Cancer [facility])    left breast cancer    Cataracts, bilateral    removed by surgery   CHF (congestive heart failure) ([place])    PACEMAKER & DEFIB   Complication of anesthesia    hypotensive after back surgery in 2006   Depression    Dyslipidemia    Fainted 04/21/06   AT [facility]   GERD (gastroesophageal reflux disease)    Headache(784.0)    Hearing loss    bilateral hearing aids   HLD (hyperlipidemia)    diet controlled    Hypertension    Hypothyroidism    ICD (implantable cardiac defibrillator) in place    pt has pacer/icd   ICD (implantable cardiac defibrillator), biventricular, in situ    LBBB (left bundle branch block)    Memory loss    Nonischemic cardiomyopathy ([place])    Normal coronary arteries    s/p cardiac cath 2007   Pacemaker    ICD [employer]   Syncope    Systolic CHF [facility])    Vertigo    Wears glasses    Past Surgical History:  Procedure Laterality Date   BACK SURGERY     lumbar fusion    BREAST LUMPECTOMY Left 2008   BREAST SURGERY  2000   LUMP REMOVAL. STAGE 1 CANCER   CARDIAC CATHETERIZATION     CATARACT EXTRACTION     COLONOSCOPY     EYE SURGERY     IMPLANTABLE CARDIOVERTER DEFIBRILLATOR GENERATOR CHANGE N/A 12/18/2012   Procedure: IMPLANTABLE CARDIOVERTER DEFIBRILLATOR GENERATOR CHANGE;  Surgeon: [name], MD;  Location: [facility] CATH LAB;  Service: Cardiovascular;  Laterality: N/A;   JOINT REPLACEMENT  06/14/01   right   LUMBAR FUSION  2006   MASS EXCISION  11/08/2011   Procedure: EXCISION MASS;  Surgeon: [name], MD;  Location: [facility] ORS;  Service: General;  Laterality: Left;  Excision Left Thigh Mass   MASTECTOMY W/ SENTINEL NODE BIOPSY Left  06/04/2019   Procedure: LEFT MASTECTOMY WITH SENTINEL LYMPH NODE BIOPSY;  Surgeon: [name], MD;  Location: [place];  Service: General;  Laterality: Left;   MASTECTOMY, PARTIAL  2008   GOT PACEMAKER AND DEFIB AT THAT TIME   PACEMAKER INSERTION  04/23/06   TOTAL KNEE ARTHROPLASTY  05/17/01   RIGHT KNEE   TOTAL KNEE ARTHROPLASTY Left 11/29/2014   Procedure: TOTAL LEFT KNEE ARTHROPLASTY;  Surgeon: Paralee Cancel, MD;  Location: [facility];  Service: Orthopedics;  Laterality: Left;   Patient Active Problem List   Diagnosis Date Noted   Sciatica associated with disorder of multiple sites of spine 03/20/2021   Bipolar II disorder ([place]) 03/20/2021   Chronic pain syndrome 03/20/2021   Dementia without behavioral disturbance ([place]) 02/04/2020   Urinary retention 02/04/2020   Seasonal allergies 02/04/2020   Recurrent major depressive disorder, in partial remission ([place]) 07/17/2019   Status post left mastectomy 07/01/2019   Breast cancer of lower-outer quadrant of left female breast ([place]) 06/04/2019   Candida infection, oral 01/15/2019   Senile purpura ([place]) 04/14/2018   Lumbar post-laminectomy syndrome 12/03/2017   Lumbar spondylosis 12/03/2017   History of back surgery 09/01/2017   Constipation 09/01/2017   Hypothyroidism due to  acquired atrophy of thyroid 09/01/2017   Mixed hyperlipidemia 09/01/2017   Age-related osteoporosis without current pathological fracture 09/01/2017   High risk medication use 09/01/2017   Arthritis of hand 05/17/2017   Atherosclerosis of native arteries of extremity with intermittent claudication ([place]) 05/15/2017   Status post total bilateral knee replacement 12/20/2016   Gait abnormality 07/16/2016   Chronic low back pain 07/16/2016   Mild cognitive impairment 07/16/2016   Wrist pain 05/10/2015   S/P left TKA 11/29/2014   S/P knee replacement 11/29/2014   Spontaneous bruising 08/27/2014   Numbness and tingling in right hand 07/28/2014   Essential tremor 07/28/2014    Dizziness 05/28/2014   Shaky 05/28/2014   Memory loss 05/28/2014   Depression 05/06/2014   Lipoma of left upper thigh 3x5 cm 09/28/2011   Cerebral vascular accident [facility]) 08/09/2011   Syncope 08/09/2011   History of breast cancer [id number], s/p BCT 2008, triple negative 01/26/2011   Chronic L breast pain with chronic recurrent seroma, s/p excisional biopsy 01/12/2010 01/26/2011   Fainted    ICD (implantable cardioverter-defibrillator), biventricular, in situ 92/33/0076   Chronic systolic heart failure ([place]) 08/02/2010   Essential hypertension 08/02/2010   REFERRING DIAG: N39.45 (ICD-10-CM) - Continuous leakage   THERAPY DIAG:  Muscle weakness (generalized)   Cramp and spasm   Other abnormalities of gait and mobility   Other lack of coordination   Rationale for Evaluation and Treatment: Rehabilitation   ONSET DATE: 6 months ago   SUBJECTIVE:                                                                                                                                                                                            SUBJECTIVE STATEMENT: Patient reports she is occasionally able to get to the bathroom when she feels the urge but "if it starts coming, I can't stop it".  I feel a lot more stable when I'm walking.      PAIN:  03/19/2022; Are you having pain? Mild/moderate in Bil buttocks 4-5/10 as she goes from sit to stand . Worse with prolonged sitting. Pain will decrease when she is standing for 5 minutes.    PRECAUTIONS: ICD/Pacemaker, left breast cancer   WEIGHT BEARING RESTRICTIONS: No   FALLS:  Has patient fallen in last 6 months? No   LIVING ENVIRONMENT: Lives with: lives with their family     OCCUPATION: retired   PLOF: Independent   PATIENT GOALS: reduce leakage   PERTINENT HISTORY:  Left breast cancer; CHF; Hypothyroidism; Pacemaker; Lumbar fusion; Left knee arthroplasty     URINATION: Pain with urination: No Fully empty bladder: Yes:  sometimes, after she is done urinating she feels like there is still some urine remaining Stream: Strong, sometimes has a trickling flow Urgency: Yes:   Frequency: day time voids 4 night times every 2 hours or 1 hour Leakage:  does not feel the leakage Pads: Yes: 4 pads per day, night wears a depends with toilet paper   PREGNANCY: Vaginal deliveries 1     PROLAPSE: Cystocele   and Urethrocele      OBJECTIVE: (objective measures completed at initial evaluation unless otherwise dated)   DIAGNOSTIC FINDINGS:  Pessary fittings     COGNITION: Overall cognitive status: Within functional limits for tasks assessed                          SENSATION: Light touch: Appears intact Proprioception: Appears intact     FUNCTIONAL TESTS:  5 times sit to stand: 20 sec; 03/19/2022 13 sec but felt urinary leakage while doing the task.  Timed up and go (TUG): 15 sec 03/09/2022: 16 sec, 13 sec, 11 sec   GAIT: Assistive device utilized: Single point cane, when she is going out Level of assistance: Complete Independence Comments: small steps and slowly    POSTURE: No Significant postural limitations   PELVIC ALIGNMENT:     LOWER EXTREMITY ROM:   Passive ROM Right eval Left eval  Hip external rotation 25 45   (Blank rows = not tested)   LOWER EXTREMITY MMT:   MMT Right eval Left eval  Hip extension 3/5 3/5  Hip abduction 3/5 3/5  Hip adduction 3/5 3/5    PALPATION:    External Perineal Exam some redness in the vulva area                             Internal Pelvic Floor tightness in the introitus. She would contract her gluteal more than the vaginal but after tactile cues was able to isolate the vaginal canal.    Patient confirms identification and approves PT to assess internal pelvic floor and treatment Yes   PELVIC MMT:   MMT eval 03/09/2022  Vaginal 1/5 2/5        TONE: Low tone   PROLAPSE: Had pessary in   TODAY'S TREATMENT:   03/21/2022 Nustep x 5 min level  2 Seated ball squeeze x 20 Sit to stand with ball squeeze x 10 Seated clam with green loop 2 x 10  Seated hamstring curl with yellow band 2 x 10 each LE Seated LAQ with 2 lbs 2 x 10 each Seated mini sit ups holding red plyo ball 2 x 10 Educated patient on mentally communicating with her body using the "calm down bladder" feedback method when she feels urgency  03/19/2022  Neuromuscular re-education: Pelvic floor contraction training: Sitting on physioball with pelvic floor contraction feeling the lift and working on not holding her breath.  Sitting on physioball and move pelvis in side to side and tilting with pelvic floor contraction Sit to stand from the physioball with pelvic floor contraction  Sit to stand from a chair with pelvic floor contraction and breathing out.  Educated patient with urge to void and she was able to return demonstration.  Exercises: Strengthening: Nustep: New model: L5 x 5 min with concurrent discuss of pain & status    03/14/22:  Seated ball squeeze 20x Blue loop seated clamshells 2x10 Vc to sit tall throughout Purple ball push downs (ball  on knees) vc's to contract pelvic floor 2 x 10 Holding each rep 5 sec Nustep: New model: L5 x 5 min with concurrent discuss of pain & status Sit to stand 2 x 5 with ball squeeze and vc's for contracting PF  Seated LAQ with 2# 2 x 10 Seated March with 2# x 20 alternating Seated Hip ER with 2# x 10 Bridge 2x5 with PF contraction at top, Vc to hold the contraction as she lowers. Supine Bil piriformis stretch: 2x 20 sec Bil; added to HEP  also instructed in seated piriformis stretch for option Added several exercises to HEP and provided handouts      PATIENT EDUCATION:  02/23/2022 Education details: Access Code: [email or web address] Person educated: Patient Education method: Consulting civil engineer, Demonstration, Tactile cues, Verbal cues, and Handouts Education comprehension: verbalized understanding, returned demonstration, verbal cues  required, tactile cues required, and needs further education   HOME EXERCISE PROGRAM:  Access Code: [email or web address] URL: [email or web address] Date: 03/14/2022 Prepared by: Candyce Churn   Exercises - Supine Pelvic Floor Contraction  - 3 x daily - 7 x weekly - 1 sets - 10 reps - 5 sec hold - Supine Piriformis Stretch with Foot on Ground  - 1 x daily - 7 x weekly - 2 reps - 20 hold - Hooklying Gluteal Sets  - 1 x daily - 7 x weekly - 10 reps - 5 hold - Pelvic Floor Contractions in Hooklying with Adduction  - 1 x daily - 7 x weekly - 3 sets - 10 reps - Supine Bridge with Pelvic Floor Contraction  - 1 x daily - 7 x weekly - 3 sets - 10 reps - Seated Long Arc Quad  - 1 x daily - 7 x weekly - 2 sets - 10 reps - Seated March  - 1 x daily - 7 x weekly - 2 sets - 10 reps - Seated Abdominal Press into [employer]  - 1 x daily - 7 x weekly - 1 sets - 10 reps   ASSESSMENT:   CLINICAL IMPRESSION: Ms. Mcbrearty did very well today.  Upon standing after last exercise and having educated patient on mental control of her bladder and calming techniques, she felt the urge to go to the bathroom.  Patient used the bladder calming method and was able to reach the commode without leakage per her report.   Patient will benefit from skilled therapy to improve strength and coordination to reduce leakage and improve balance as she is walking to the commode.    OBJECTIVE IMPAIRMENTS: decreased activity tolerance, decreased coordination, decreased endurance, decreased mobility, difficulty walking, decreased strength, and increased fascial restrictions.    ACTIVITY LIMITATIONS: standing, transfers, continence, toileting, and locomotion level   PARTICIPATION LIMITATIONS: shopping and community activity   PERSONAL FACTORS: Age, Fitness, Past/current experiences, and 1-2 comorbidities: Left breast cancer; CHF; Hypothyroidism; Pacemaker; Lumbar fusion; Left knee arthroplasty  are also affecting patient's functional  outcome.    REHAB POTENTIAL: Good   CLINICAL DECISION MAKING: Evolving/moderate complexity   EVALUATION COMPLEXITY: Moderate     GOALS: Goals reviewed with patient? Yes   SHORT TERM GOALS: Target date: 03/23/2022   Patient independent with initial HEP for pelvic floor contraction.  Baseline: Goal status: Goal met 03/05/22   2.  Patient is able to feel the urinary leakage due to improve pelvic floor tone.  Baseline:  Goal status: Met 03/19/2022   3.  Patient educated on the urge to void to be able to walk to the bathroom slowl  Baseline:  Goal status: INITIAL   4.  Timed up and go </= 14 sec Baseline:  Goal status: Met 03/09/2022   5.  Sit to stand </- 18 sec  Baseline:  Goal status: Met 03/19/2020     LONG TERM GOALS: Target date: 05/18/2021   Patient independent with advanced hep for pelvic floor strength and balance exercises.  Baseline:  Goal status: INITIAL   2.  Patient reports her urinary leakage is </= 50% due to improved pelvic floor strength >/ 3/5 Baseline:  Goal status: INITIAL   3.  Time up and go test is </= 13 sec. So she is able to walk to the commode safely when she has the urge to urinate.  Baseline:  Goal status: INITIAL   4.  Sit to stand score is </= 15 sec so she is able to safely get out of the chair or get up from the commode.  Baseline:  Goal status: INITIAL   5.  Patient is able to wake up 2 times at night instead of every hour due to increased in pelvic floor strength and understanding on how to use the urge to void behavioral technique.  Baseline:  Goal status: INITIAL     PLAN:   PT FREQUENCY: 1-2x/week   PT DURATION: 12 weeks   PLANNED INTERVENTIONS: Therapeutic exercises, Therapeutic activity, Neuromuscular re-education, Balance training, Gait training, Patient/Family education, Self Care, Biofeedback, and Manual therapy   PLAN FOR NEXT SESSION: Continue with hip strength: consider adding standing hip exercises to incorporate  balance. Sit to stand without gluteal contraction just pelvic floor contraction.   Rudi Heap PT, DPT 03/22/22  1:27 PM

## 2022-03-26 ENCOUNTER — Encounter: Payer: Self-pay | Admitting: Physical Therapy

## 2022-03-26 ENCOUNTER — Ambulatory Visit: Payer: Medicare Other | Admitting: Physical Therapy

## 2022-03-26 ENCOUNTER — Telehealth: Payer: Self-pay

## 2022-03-26 DIAGNOSIS — R252 Cramp and spasm: Secondary | ICD-10-CM

## 2022-03-26 DIAGNOSIS — M6281 Muscle weakness (generalized): Secondary | ICD-10-CM

## 2022-03-26 DIAGNOSIS — R293 Abnormal posture: Secondary | ICD-10-CM

## 2022-03-26 DIAGNOSIS — R262 Difficulty in walking, not elsewhere classified: Secondary | ICD-10-CM

## 2022-03-26 DIAGNOSIS — R2689 Other abnormalities of gait and mobility: Secondary | ICD-10-CM

## 2022-03-26 DIAGNOSIS — R278 Other lack of coordination: Secondary | ICD-10-CM

## 2022-03-26 NOTE — Therapy (Signed)
OUTPATIENT PHYSICAL THERAPY TREATMENT NOTE   Patient Name: [name] MRN: [id number] DOB:Feb 07, 1934, 88 y.o., female Today's Date: 03/26/2022  PCP: [name], NP  REFERRING PROVIDER: [name], MD   END OF SESSION:   PT End of Session - 03/26/22 1114     Visit Number 8    Date for PT Re-Evaluation 05/18/22    Authorization Type [employer]    Authorization - Visit Number 8    Authorization - Number of Visits 10    Progress Note Due on Visit 10    PT Start Time 1055    PT Stop Time 4098    PT Time Calculation (min) 39 min    Activity Tolerance Patient tolerated treatment well    Behavior During Therapy WFL for tasks assessed/performed              Past Medical History:  Diagnosis Date   Arthritis    Cancer ([place])    left breast cancer    Cataracts, bilateral    removed by surgery   CHF (congestive heart failure) ([place])    PACEMAKER & DEFIB   Complication of anesthesia    hypotensive after back surgery in 2006   Depression    Dyslipidemia    Fainted 04/21/06   AT [facility]   GERD (gastroesophageal reflux disease)    Headache(784.0)    Hearing loss    bilateral hearing aids   HLD (hyperlipidemia)    diet controlled    Hypertension    Hypothyroidism    ICD (implantable cardiac defibrillator) in place    pt has pacer/icd   ICD (implantable cardiac defibrillator), biventricular, in situ    LBBB (left bundle branch block)    Memory loss    Nonischemic cardiomyopathy ([place])    Normal coronary arteries    s/p cardiac cath 2007   Pacemaker    ICD [employer]   Syncope    Systolic CHF [facility])    Vertigo    Wears glasses    Past Surgical History:  Procedure Laterality Date   BACK SURGERY     lumbar fusion    BREAST LUMPECTOMY Left 2008   BREAST SURGERY  2000   LUMP REMOVAL. STAGE 1 CANCER   CARDIAC CATHETERIZATION     CATARACT EXTRACTION     COLONOSCOPY     EYE SURGERY     IMPLANTABLE CARDIOVERTER DEFIBRILLATOR GENERATOR CHANGE N/A  12/18/2012   Procedure: IMPLANTABLE CARDIOVERTER DEFIBRILLATOR GENERATOR CHANGE;  Surgeon: [name], MD;  Location: [facility] CATH LAB;  Service: Cardiovascular;  Laterality: N/A;   JOINT REPLACEMENT  06/14/01   right   LUMBAR FUSION  2006   MASS EXCISION  11/08/2011   Procedure: EXCISION MASS;  Surgeon: [name], MD;  Location: [facility] ORS;  Service: General;  Laterality: Left;  Excision Left Thigh Mass   MASTECTOMY W/ SENTINEL NODE BIOPSY Left 06/04/2019   Procedure: LEFT MASTECTOMY WITH SENTINEL LYMPH NODE BIOPSY;  Surgeon: [name], MD;  Location: [place];  Service: General;  Laterality: Left;   MASTECTOMY, PARTIAL  2008   GOT PACEMAKER AND DEFIB AT THAT TIME   PACEMAKER INSERTION  04/23/06   TOTAL KNEE ARTHROPLASTY  05/17/01   RIGHT KNEE   TOTAL KNEE ARTHROPLASTY Left 11/29/2014   Procedure: TOTAL LEFT KNEE ARTHROPLASTY;  Surgeon: Paralee Cancel, MD;  Location: [facility];  Service: Orthopedics;  Laterality: Left;   Patient Active Problem List   Diagnosis Date Noted   Sciatica associated with disorder  of multiple sites of spine 03/20/2021   Bipolar II disorder ([place]) 03/20/2021   Chronic pain syndrome 03/20/2021   Dementia without behavioral disturbance ([address] [place] [address]) 02/04/2020   Urinary retention 02/04/2020   Seasonal allergies 02/04/2020   Recurrent major depressive disorder, in partial remission ([place]) 07/17/2019   Status post left mastectomy 07/01/2019   Breast cancer of lower-outer quadrant of left female breast ([place]) 06/04/2019   Candida infection, oral 01/15/2019   Senile purpura ([place]) 04/14/2018   Lumbar post-laminectomy syndrome 12/03/2017   Lumbar spondylosis 12/03/2017   History of back surgery 09/01/2017   Constipation 09/01/2017   Hypothyroidism due to acquired atrophy of thyroid 09/01/2017   Mixed hyperlipidemia 09/01/2017   Age-related osteoporosis without current pathological fracture 09/01/2017   High risk medication use 09/01/2017   Arthritis of hand 05/17/2017   Atherosclerosis  of native arteries of extremity with intermittent claudication ([place]) 05/15/2017   Status post total bilateral knee replacement 12/20/2016   Gait abnormality 07/16/2016   Chronic low back pain 07/16/2016   Mild cognitive impairment 07/16/2016   Wrist pain 05/10/2015   S/P left TKA 11/29/2014   S/P knee replacement 11/29/2014   Spontaneous bruising 08/27/2014   Numbness and tingling in right hand 07/28/2014   Essential tremor 07/28/2014   Dizziness 05/28/2014   Shaky 05/28/2014   Memory loss 05/28/2014   Depression 05/06/2014   Lipoma of left upper thigh 3x5 cm 09/28/2011   Cerebral vascular accident ([place]) 08/09/2011   Syncope 08/09/2011   History of breast cancer [id number], s/p BCT 2008, triple negative 01/26/2011   Chronic L breast pain with chronic recurrent seroma, s/p excisional biopsy 01/12/2010 01/26/2011   Fainted    ICD (implantable cardioverter-defibrillator), biventricular, in situ 60/60/0459   Chronic systolic heart failure ([place]) 08/02/2010   Essential hypertension 08/02/2010   REFERRING DIAG: N39.45 (ICD-10-CM) - Continuous leakage   THERAPY DIAG:  Muscle weakness (generalized)   Cramp and spasm   Other abnormalities of gait and mobility   Other lack of coordination   Rationale for Evaluation and Treatment: Rehabilitation   ONSET DATE: 6 months ago   SUBJECTIVE:                                                                                                                                                                                            SUBJECTIVE STATEMENT: No new complaints   PAIN:  03/19/2022; Are you having pain?Not right now     PRECAUTIONS: ICD/Pacemaker, left breast cancer   WEIGHT BEARING RESTRICTIONS: No   FALLS:  Has patient fallen in last 6 months? No   LIVING ENVIRONMENT: Lives with: lives with  their family     OCCUPATION: retired   PLOF: Independent   PATIENT GOALS: reduce leakage   PERTINENT HISTORY:  Left breast cancer;  CHF; Hypothyroidism; Pacemaker; Lumbar fusion; Left knee arthroplasty     URINATION: Pain with urination: No Fully empty bladder: Yes: sometimes, after she is done urinating she feels like there is still some urine remaining Stream: Strong, sometimes has a trickling flow Urgency: Yes:   Frequency: day time voids 4 night times every 2 hours or 1 hour Leakage:  does not feel the leakage Pads: Yes: 4 pads per day, night wears a depends with toilet paper   PREGNANCY: Vaginal deliveries 1     PROLAPSE: Cystocele   and Urethrocele      OBJECTIVE: (objective measures completed at initial evaluation unless otherwise dated)   DIAGNOSTIC FINDINGS:  Pessary fittings     COGNITION: Overall cognitive status: Within functional limits for tasks assessed                          SENSATION: Light touch: Appears intact Proprioception: Appears intact     FUNCTIONAL TESTS:  5 times sit to stand: 20 sec; 03/19/2022 13 sec but felt urinary leakage while doing the task.  Timed up and go (TUG): 15 sec 03/09/2022: 16 sec, 13 sec, 11 sec   GAIT: Assistive device utilized: Single point cane, when she is going out Level of assistance: Complete Independence Comments: small steps and slowly    POSTURE: No Significant postural limitations   PELVIC ALIGNMENT:     LOWER EXTREMITY ROM:   Passive ROM Right eval Left eval  Hip external rotation 25 45   (Blank rows = not tested)   LOWER EXTREMITY MMT:   MMT Right eval Left eval  Hip extension 3/5 3/5  Hip abduction 3/5 3/5  Hip adduction 3/5 3/5    PALPATION:    External Perineal Exam some redness in the vulva area                             Internal Pelvic Floor tightness in the introitus. She would contract her gluteal more than the vaginal but after tactile cues was able to isolate the vaginal canal.    Patient confirms identification and approves PT to assess internal pelvic floor and treatment Yes   PELVIC MMT:   MMT eval  03/09/2022  Vaginal 1/5 2/5        TONE: Low tone   PROLAPSE: Had pessary in   TODAY'S TREATMENT:    03/26/22: Nustep x 6 min level 2 Standing hip flexor & hamstring stretch on stair 2x10 sec Bil Supine ball squeeze x 20 Sit to stand 2 x 10 Seated clam with blue loop  x 10  Seated hamstring curl with yellow band 2 x 10 each LE Seated LAQ with 2 lbs 2 x 10 each Seated mini sit ups holding red plyo ball 2 x 10 Standing hip abduction and extension 10x Bil at [facility] Supine bridge with ball squeeze 10x  03/21/2022 Nustep x 5 min level 2 Seated ball squeeze x 20 Sit to stand with ball squeeze x 10 Seated clam with green loop 2 x 10  Seated hamstring curl with yellow band 2 x 10 each LE Seated LAQ with 2 lbs 2 x 10 each Seated mini sit ups holding red plyo ball 2 x 10 Educated patient on mentally communicating with her  body using the "calm down bladder" feedback method when she feels urgency  03/19/2022  Neuromuscular re-education: Pelvic floor contraction training: Sitting on physioball with pelvic floor contraction feeling the lift and working on not holding her breath.  Sitting on physioball and move pelvis in side to side and tilting with pelvic floor contraction Sit to stand from the physioball with pelvic floor contraction  Sit to stand from a chair with pelvic floor contraction and breathing out.  Educated patient with urge to void and she was able to return demonstration.  Exercises: Strengthening: Nustep: New model: L5 x 5 min with concurrent discuss of pain & status       PATIENT EDUCATION:  02/23/2022 Education details: Access Code: [email or web address] Person educated: Patient Education method: Explanation, Demonstration, Tactile cues, Verbal cues, and Handouts Education comprehension: verbalized understanding, returned demonstration, verbal cues required, tactile cues required, and needs further education   HOME EXERCISE PROGRAM:  Access Code: [email or web address] URL:  [email or web address] Date: 03/14/2022 Prepared by: Candyce Churn   Exercises - Supine Pelvic Floor Contraction  - 3 x daily - 7 x weekly - 1 sets - 10 reps - 5 sec hold - Supine Piriformis Stretch with Foot on Ground  - 1 x daily - 7 x weekly - 2 reps - 20 hold - Hooklying Gluteal Sets  - 1 x daily - 7 x weekly - 10 reps - 5 hold - Pelvic Floor Contractions in Hooklying with Adduction  - 1 x daily - 7 x weekly - 3 sets - 10 reps - Supine Bridge with Pelvic Floor Contraction  - 1 x daily - 7 x weekly - 3 sets - 10 reps - Seated Long Arc Quad  - 1 x daily - 7 x weekly - 2 sets - 10 reps - Seated March  - 1 x daily - 7 x weekly - 2 sets - 10 reps - Seated Abdominal Press into [employer]  - 1 x daily - 7 x weekly - 1 sets - 10 reps   ASSESSMENT:   CLINICAL IMPRESSION: Pt arrives with no pain and no new complaints: "holding steady." Increased active strengthening exercises a little    OBJECTIVE IMPAIRMENTS: decreased activity tolerance, decreased coordination, decreased endurance, decreased mobility, difficulty walking, decreased strength, and increased fascial restrictions.    ACTIVITY LIMITATIONS: standing, transfers, continence, toileting, and locomotion level   PARTICIPATION LIMITATIONS: shopping and community activity   PERSONAL FACTORS: Age, Fitness, Past/current experiences, and 1-2 comorbidities: Left breast cancer; CHF; Hypothyroidism; Pacemaker; Lumbar fusion; Left knee arthroplasty  are also affecting patient's functional outcome.    REHAB POTENTIAL: Good   CLINICAL DECISION MAKING: Evolving/moderate complexity   EVALUATION COMPLEXITY: Moderate     GOALS: Goals reviewed with patient? Yes   SHORT TERM GOALS: Target date: 03/23/2022   Patient independent with initial HEP for pelvic floor contraction.  Baseline: Goal status: Goal met 03/05/22   2.  Patient is able to feel the urinary leakage due to improve pelvic floor tone.  Baseline:  Goal status:  Met 03/19/2022   3.  Patient educated on the urge to void to be able to walk to the bathroom slowl Baseline:  Goal status: INITIAL   4.  Timed up and go </= 14 sec Baseline:  Goal status: Met 03/09/2022   5.  Sit to stand </- 18 sec  Baseline:  Goal status: Met 03/19/2020     LONG TERM GOALS: Target date: 05/18/2021   Patient independent with advanced hep for pelvic floor  strength and balance exercises.  Baseline:  Goal status: INITIAL   2.  Patient reports her urinary leakage is </= 50% due to improved pelvic floor strength >/ 3/5 Baseline:  Goal status: INITIAL   3.  Time up and go test is </= 13 sec. So she is able to walk to the commode safely when she has the urge to urinate.  Baseline:  Goal status: INITIAL   4.  Sit to stand score is </= 15 sec so she is able to safely get out of the chair or get up from the commode.  Baseline:  Goal status: INITIAL   5.  Patient is able to wake up 2 times at night instead of every hour due to increased in pelvic floor strength and understanding on how to use the urge to void behavioral technique.  Baseline:  Goal status: INITIAL     PLAN:   PT FREQUENCY: 1-2x/week   PT DURATION: 12 weeks   PLANNED INTERVENTIONS: Therapeutic exercises, Therapeutic activity, Neuromuscular re-education, Balance training, Gait training, Patient/Family education, Self Care, Biofeedback, and Manual therapy   PLAN FOR NEXT SESSION: Continue with hip strength: consider adding standing hip exercises to incorporate balance. Sit to stand without gluteal contraction just pelvic floor contraction.   Myrene Galas, PTA 03/26/22 11:33 AM  [facility] [address], [place],  [address] Phone # [phone] Fax [phone]

## 2022-03-26 NOTE — Telephone Encounter (Signed)
Make sure she is hydrated- would check her blood pressure to see what that is, can always make appt to be seen.

## 2022-03-26 NOTE — Telephone Encounter (Signed)
Patient called stating that she is having dizziness often for past 3 days. She states it happens more when going from laying to standing. She stated that she has tried getting up slowly,but that doesn't seem to help. She denies any other symptoms.  Message routed to Sherrie Mustache, NP

## 2022-03-27 NOTE — Telephone Encounter (Signed)
Spoke with patient and she verbalized her understanding.

## 2022-03-28 ENCOUNTER — Encounter: Payer: Self-pay | Admitting: Physical Therapy

## 2022-03-28 ENCOUNTER — Ambulatory Visit: Payer: Medicare Other | Admitting: Physical Therapy

## 2022-03-28 DIAGNOSIS — R262 Difficulty in walking, not elsewhere classified: Secondary | ICD-10-CM

## 2022-03-28 DIAGNOSIS — M6281 Muscle weakness (generalized): Secondary | ICD-10-CM | POA: Diagnosis not present

## 2022-03-28 DIAGNOSIS — R293 Abnormal posture: Secondary | ICD-10-CM

## 2022-03-28 DIAGNOSIS — R278 Other lack of coordination: Secondary | ICD-10-CM

## 2022-03-28 DIAGNOSIS — R252 Cramp and spasm: Secondary | ICD-10-CM

## 2022-03-28 DIAGNOSIS — R2689 Other abnormalities of gait and mobility: Secondary | ICD-10-CM

## 2022-03-28 NOTE — Therapy (Signed)
OUTPATIENT PHYSICAL THERAPY TREATMENT NOTE   Patient Name: [name] MRN: [id number] DOB:08-Jan-1934, 88 y.o., female Today's Date: 03/28/2022  PCP: [name], NP   REFERRING PROVIDER: [name], MD   END OF SESSION:   PT End of Session - 03/28/22 1149     Visit Number 9    Date for PT Re-Evaluation 05/18/22    Authorization Type [employer]    Authorization - Visit Number 9    Authorization - Number of Visits 10    Progress Note Due on Visit 10    PT Start Time 6712    PT Stop Time 1225    PT Time Calculation (min) 40 min    Activity Tolerance Patient tolerated treatment well    Behavior During Therapy WFL for tasks assessed/performed             Past Medical History:  Diagnosis Date   Arthritis    Cancer ([place])    left breast cancer    Cataracts, bilateral    removed by surgery   CHF (congestive heart failure) ([place])    PACEMAKER & DEFIB   Complication of anesthesia    hypotensive after back surgery in 2006   Depression    Dyslipidemia    Fainted 04/21/06   AT [facility]   GERD (gastroesophageal reflux disease)    Headache(784.0)    Hearing loss    bilateral hearing aids   HLD (hyperlipidemia)    diet controlled    Hypertension    Hypothyroidism    ICD (implantable cardiac defibrillator) in place    pt has pacer/icd   ICD (implantable cardiac defibrillator), biventricular, in situ    LBBB (left bundle branch block)    Memory loss    Nonischemic cardiomyopathy ([place])    Normal coronary arteries    s/p cardiac cath 2007   Pacemaker    ICD [employer]   Syncope    Systolic CHF [facility])    Vertigo    Wears glasses    Past Surgical History:  Procedure Laterality Date   BACK SURGERY     lumbar fusion    BREAST LUMPECTOMY Left 2008   BREAST SURGERY  2000   LUMP REMOVAL. STAGE 1 CANCER   CARDIAC CATHETERIZATION     CATARACT EXTRACTION     COLONOSCOPY     EYE SURGERY     IMPLANTABLE CARDIOVERTER DEFIBRILLATOR GENERATOR CHANGE N/A  12/18/2012   Procedure: IMPLANTABLE CARDIOVERTER DEFIBRILLATOR GENERATOR CHANGE;  Surgeon: [name], MD;  Location: [facility] CATH LAB;  Service: Cardiovascular;  Laterality: N/A;   JOINT REPLACEMENT  06/14/01   right   LUMBAR FUSION  2006   MASS EXCISION  11/08/2011   Procedure: EXCISION MASS;  Surgeon: [name], MD;  Location: [facility] OR[facility];  Service: General;  Laterality: Left;  Excision Left Thigh Mass   MASTECTOMY W/ SENTINEL NODE BIOPSY Left 06/04/2019   Procedure: LEFT MASTECTOMY WITH SENTINEL LYMPH NODE BIOPSY;  Surgeon: [name], MD;  Location: [facility];  Service: General;  Laterality: Left;   MASTECTOMY, PARTIAL  2008   GOT PACEMAKER AND DEFIB AT THAT TIME   PACEMAKER INSERTION  04/23/06   TOTAL KNEE ARTHROPLASTY  05/17/01   RIGHT KNEE   TOTAL KNEE ARTHROPLASTY Left 11/29/2014   Procedure: TOTAL LEFT KNEE ARTHROPLASTY;  Surgeon: Paralee Cancel, MD;  Location: [facility];  Service: Orthopedics;  Laterality: Left;   Patient Active Problem List   Diagnosis Date Noted   Sciatica associated with disorder  of multiple sites of spine 03/20/2021   Bipolar II disorder ([place]) 03/20/2021   Chronic pain syndrome 03/20/2021   Dementia without behavioral disturbance ([place]) 02/04/2020   Urinary retention 02/04/2020   Seasonal allergies 02/04/2020   Recurrent major depressive disorder, in partial remission ([place]) 07/17/2019   Status post left mastectomy 07/01/2019   Breast cancer of lower-outer quadrant of left female breast ([place]) 06/04/2019   Candida infection, oral 01/15/2019   Senile purpura ([place]) 04/14/2018   Lumbar post-laminectomy syndrome 12/03/2017   Lumbar spondylosis 12/03/2017   History of back surgery 09/01/2017   Constipation 09/01/2017   Hypothyroidism due to acquired atrophy of thyroid 09/01/2017   Mixed hyperlipidemia 09/01/2017   Age-related osteoporosis without current pathological fracture 09/01/2017   High risk medication use 09/01/2017   Arthritis of hand 05/17/2017   Atherosclerosis  of native arteries of extremity with intermittent claudication ([place]) 05/15/2017   Status post total bilateral knee replacement 12/20/2016   Gait abnormality 07/16/2016   Chronic low back pain 07/16/2016   Mild cognitive impairment 07/16/2016   Wrist pain 05/10/2015   S/P left TKA 11/29/2014   S/P knee replacement 11/29/2014   Spontaneous bruising 08/27/2014   Numbness and tingling in right hand 07/28/2014   Essential tremor 07/28/2014   Dizziness 05/28/2014   Shaky 05/28/2014   Memory loss 05/28/2014   Depression 05/06/2014   Lipoma of left upper thigh 3x5 cm 09/28/2011   Cerebral vascular accident ([place]) 08/09/2011   Syncope 08/09/2011   History of breast cancer [id number], s/p BCT 2008, triple negative 01/26/2011   Chronic L breast pain with chronic recurrent seroma, s/p excisional biopsy 01/12/2010 01/26/2011   Fainted    ICD (implantable cardioverter-defibrillator), biventricular, in situ 72/12/4707   Chronic systolic heart failure ([place]) 08/02/2010   Essential hypertension 08/02/2010   REFERRING DIAG: N39.45 (ICD-10-CM) - Continuous leakage   THERAPY DIAG:  Muscle weakness (generalized)   Cramp and spasm   Other abnormalities of gait and mobility   Other lack of coordination   Rationale for Evaluation and Treatment: Rehabilitation   ONSET DATE: 6 months ago   SUBJECTIVE:                                                                                                                                                                                            SUBJECTIVE STATEMENT: I have felt dizzy today. I feel dizzy sporadically. Patient has more leakage at night. Patient has to change her pad 3 times and during the 2 times.    PAIN:  03/19/2022; Are you having pain?Not right now     PRECAUTIONS: ICD/Pacemaker, left breast cancer  WEIGHT BEARING RESTRICTIONS: No   FALLS:  Has patient fallen in last 6 months? No   LIVING ENVIRONMENT: Lives with: lives with their  family     OCCUPATION: retired   PLOF: Independent   PATIENT GOALS: reduce leakage   PERTINENT HISTORY:  Left breast cancer; CHF; Hypothyroidism; Pacemaker; Lumbar fusion; Left knee arthroplasty     URINATION: Pain with urination: No Fully empty bladder: Yes: sometimes, after she is done urinating she feels like there is still some urine remaining Stream: Strong, sometimes has a trickling flow Urgency: Yes:   Frequency: day time voids 4 night times every 2 hours or 1 hour Leakage:  does not feel the leakage Pads: Yes: 2 pads per day, night wears a depends with toilet paper, 3 times at night   PREGNANCY: Vaginal deliveries 1     PROLAPSE: Cystocele   and Urethrocele      OBJECTIVE: (objective measures completed at initial evaluation unless otherwise dated)   DIAGNOSTIC FINDINGS:  Pessary fittings     COGNITION: Overall cognitive status: Within functional limits for tasks assessed                          SENSATION: Light touch: Appears intact Proprioception: Appears intact     FUNCTIONAL TESTS:  5 times sit to stand: 20 sec; 03/19/2022 13 sec but felt urinary leakage while doing the task.  Timed up and go (TUG): 15 sec 03/09/2022: 16 sec, 13 sec, 11 sec   GAIT: Assistive device utilized: Single point cane, when she is going out Level of assistance: Complete Independence Comments: small steps and slowly    POSTURE: No Significant postural limitations   PELVIC ALIGNMENT:     LOWER EXTREMITY ROM:   Passive ROM Right eval Left eval  Hip external rotation 25 45   (Blank rows = not tested)   LOWER EXTREMITY MMT:   MMT Right eval Left eval  Hip extension 3/5 3/5  Hip abduction 3/5 3/5  Hip adduction 3/5 3/5    PALPATION:    External Perineal Exam some redness in the vulva area                             Internal Pelvic Floor tightness in the introitus. She would contract her gluteal more than the vaginal but after tactile cues was able to isolate  the vaginal canal.    Patient confirms identification and approves PT to assess internal pelvic floor and treatment Yes   PELVIC MMT:   MMT eval 03/09/2022 03/28/22  Vaginal 1/5 2/5 3/5 with weak hug        TONE: Low tone   PROLAPSE: Had pessary in   TODAY'S TREATMENT:   03/28/22 After nustep BP was 154/77 and pulse is 68 Exercises: Strengthening:  Nustep: New model: L5 x 5 min with concurrent discuss of pain & status Bridge with ball and pelvic floor contraction 10x Horizontal abduction with yellow band and ball squeeze to contract the pelvic floor 10x Supine bilateral shoulder flexion holding yellow band with ball squeeze and pelvic floor contraction Supine pelvic floor contraction with tactile cues holding 5 sec 10x Sitting pelvic floor contraction holding 5 sec 10x      03/26/22: Nustep x 6 min level 2 Standing hip flexor & hamstring stretch on stair 2x10 sec Bil Supine ball squeeze x 20 Sit to stand 2 x 10 Seated clam with blue  loop  x 10  Seated hamstring curl with yellow band 2 x 10 each LE Seated LAQ with 2 lbs 2 x 10 each Seated mini sit ups holding red plyo ball 2 x 10 Standing hip abduction and extension 10x Bil at [facility] Supine bridge with ball squeeze 10x   03/21/2022 Nustep x 5 min level 2 Seated ball squeeze x 20 Sit to stand with ball squeeze x 10 Seated clam with green loop 2 x 10  Seated hamstring curl with yellow band 2 x 10 each LE Seated LAQ with 2 lbs 2 x 10 each Seated mini sit ups holding red plyo ball 2 x 10 Educated patient on mentally communicating with her body using the "calm down bladder" feedback method when she feels urgency   03/19/2022   Neuromuscular re-education: Pelvic floor contraction training: Sitting on physioball with pelvic floor contraction feeling the lift and working on not holding her breath.  Sitting on physioball and move pelvis in side to side and tilting with pelvic floor contraction Sit to stand from the  physioball with pelvic floor contraction  Sit to stand from a chair with pelvic floor contraction and breathing out.  Educated patient with urge to void and she was able to return demonstration.  Exercises: Strengthening: Nustep: New model: L5 x 5 min with concurrent discuss of pain & status        PATIENT EDUCATION:  03/28/2022 Education details: Access Code: [email or web address] Person educated: Patient Education method: Explanation, Demonstration, Tactile cues, Verbal cues, and Handouts Education comprehension: verbalized understanding, returned demonstration, verbal cues required, tactile cues required, and needs further education   HOME EXERCISE PROGRAM: 03/28/22  Access Code: [email or web address] URL: [email or web address] Date: 03/28/2022 Prepared by: Earlie Counts  Exercises - Supine Pelvic Floor Contraction  - 3 x daily - 7 x weekly - 1 sets - 10 reps - 5 sec hold - Supine Piriformis Stretch with Foot on Ground  - 1 x daily - 7 x weekly - 2 reps - 20 hold - Supine Bridge with Pelvic Floor Contraction  - 1 x daily - 7 x weekly - 3 sets - 10 reps - Seated Long Arc Quad  - 1 x daily - 7 x weekly - 2 sets - 10 reps - Seated March  - 1 x daily - 7 x weekly - 2 sets - 10 reps - Seated Abdominal Press into [employer]  - 1 x daily - 7 x weekly - 1 sets - 10 reps - Supine Shoulder Horizontal Abduction with Resistance  - 1 x daily - 3 x weekly - 1 sets - 10 reps - Supine Shoulder Flexion Extension AAROM with Dowel  - 1 x daily - 3 x weekly - 1 sets - 10 reps - Seated Pelvic Floor Contraction  - 3 x daily - 7 x weekly - 1 sets - 10 reps - 5 sec hold    ASSESSMENT:   CLINICAL IMPRESSION: Patient is wearing 2 pads during the day instead of 4. Patient wears 3 pads at night due increased leakage at night. She reports her pads are 30% dryer. Pelvic floor strength is 3/5 with weak hug. Patient is feeling safer walking to the bathroom. She has some new exercises to be done for pelvic floor at home.  Patient will benefit from skilled therapy to improve balance, strength and pelvic floor coordination to reduce leakage and walk safely to the bathroom.    OBJECTIVE IMPAIRMENTS: decreased activity tolerance, decreased coordination, decreased endurance, decreased mobility,  difficulty walking, decreased strength, and increased fascial restrictions.    ACTIVITY LIMITATIONS: standing, transfers, continence, toileting, and locomotion level   PARTICIPATION LIMITATIONS: shopping and community activity   PERSONAL FACTORS: Age, Fitness, Past/current experiences, and 1-2 comorbidities: Left breast cancer; CHF; Hypothyroidism; Pacemaker; Lumbar fusion; Left knee arthroplasty  are also affecting patient's functional outcome.    REHAB POTENTIAL: Good   CLINICAL DECISION MAKING: Evolving/moderate complexity   EVALUATION COMPLEXITY: Moderate     GOALS: Goals reviewed with patient? Yes   SHORT TERM GOALS: Target date: 03/23/2022   Patient independent with initial HEP for pelvic floor contraction.  Baseline: Goal status: Goal met 03/05/22   2.  Patient is able to feel the urinary leakage due to improve pelvic floor tone.  Baseline:  Goal status: Met 03/19/2022   3.  Patient educated on the urge to void to be able to walk to the bathroom slowl Baseline:  Goal status: INITIAL   4.  Timed up and go </= 14 sec Baseline:  Goal status: Met 03/09/2022   5.  Sit to stand </- 18 sec  Baseline:  Goal status: Met 03/19/2020     LONG TERM GOALS: Target date: 05/18/2021   Patient independent with advanced hep for pelvic floor strength and balance exercises.  Baseline:  Goal status: ongoing 03/28/22   2.  Patient reports her urinary leakage is </= 50% due to improved pelvic floor strength >/ 3/5 Baseline: Patient reports her pads are 30% dryer.  Goal status: ongoing 03/28/22   3.  Time up and go test is </= 13 sec. So she is able to walk to the commode safely when she has the urge to urinate.   Baseline:  Goal status: INITIAL   4.  Sit to stand score is </= 15 sec so she is able to safely get out of the chair or get up from the commode.  Baseline:  Goal status: INITIAL   5.  Patient is able to wake up 2 times at night instead of every hour due to increased in pelvic floor strength and understanding on how to use the urge to void behavioral technique.  Baseline: when not drinking much during the day wake up every 2 hours and increase her drinking it is every hour Goal status: ongoing 03/28/22     PLAN:   PT FREQUENCY: 1-2x/week   PT DURATION: 12 weeks   PLANNED INTERVENTIONS: Therapeutic exercises, Therapeutic activity, Neuromuscular re-education, Balance training, Gait training, Patient/Family education, Self Care, Biofeedback, and Manual therapy   PLAN FOR NEXT SESSION: Continue with hip strength: consider adding standing hip exercises to incorporate balance. Sit to stand without gluteal contraction just pelvic floor contraction. Write 10th note. First visit on 11/17.   Earlie Counts, PT 03/28/22 12:32 PM

## 2022-04-03 LAB — HM MAMMOGRAPHY

## 2022-04-04 ENCOUNTER — Ambulatory Visit: Payer: Medicare Other

## 2022-04-04 DIAGNOSIS — R252 Cramp and spasm: Secondary | ICD-10-CM

## 2022-04-04 DIAGNOSIS — M6281 Muscle weakness (generalized): Secondary | ICD-10-CM | POA: Diagnosis not present

## 2022-04-04 NOTE — Therapy (Signed)
OUTPATIENT PHYSICAL THERAPY TREATMENT NOTE   Patient Name: [name] MRN: [id number] DOB:1933-06-15, 88 y.o., female Today's Date: 04/04/2022  PCP: [name], NP   REFERRING PROVIDER: [name], MD   Progress Note Reporting Period 02/23/22 to 04/04/22  See note below for Objective Data and Assessment of Progress/Goals.      END OF SESSION:   PT End of Session - 04/04/22 1113     Visit Number 10    Date for PT Re-Evaluation 05/18/22    Authorization Type [employer]    Authorization - Visit Number 10    Authorization - Number of Visits 10    Progress Note Due on Visit 10    PT Start Time 1100    PT Stop Time 1145    PT Time Calculation (min) 45 min    Activity Tolerance Patient tolerated treatment well    Behavior During Therapy WFL for tasks assessed/performed             Past Medical History:  Diagnosis Date   Arthritis    Cancer ([place])    left breast cancer    Cataracts, bilateral    removed by surgery   CHF (congestive heart failure) ([place])    PACEMAKER & DEFIB   Complication of anesthesia    hypotensive after back surgery in 2006   Depression    Dyslipidemia    Fainted 04/21/06   AT [facility]   GERD (gastroesophageal reflux disease)    Headache(784.0)    Hearing loss    bilateral hearing aids   HLD (hyperlipidemia)    diet controlled    Hypertension    Hypothyroidism    ICD (implantable cardiac defibrillator) in place    pt has pacer/icd   ICD (implantable cardiac defibrillator), biventricular, in situ    LBBB (left bundle branch block)    Memory loss    Nonischemic cardiomyopathy ([place])    Normal coronary arteries    s/p cardiac cath 2007   Pacemaker    ICD [employer]   Syncope    Systolic CHF [facility])    Vertigo    Wears glasses    Past Surgical History:  Procedure Laterality Date   BACK SURGERY     lumbar fusion    BREAST LUMPECTOMY Left 2008   BREAST SURGERY  2000   LUMP REMOVAL. STAGE 1 CANCER   CARDIAC  CATHETERIZATION     CATARACT EXTRACTION     COLONOSCOPY     EYE SURGERY     IMPLANTABLE CARDIOVERTER DEFIBRILLATOR GENERATOR CHANGE N/A 12/18/2012   Procedure: IMPLANTABLE CARDIOVERTER DEFIBRILLATOR GENERATOR CHANGE;  Surgeon: [name], MD;  Location: [facility] CATH LAB;  Service: Cardiovascular;  Laterality: N/A;   JOINT REPLACEMENT  06/14/01   right   LUMBAR FUSION  2006   MASS EXCISION  11/08/2011   Procedure: EXCISION MASS;  Surgeon: [name], MD;  Location: [facility] ORS;  Service: General;  Laterality: Left;  Excision Left Thigh Mass   MASTECTOMY W/ SENTINEL NODE BIOPSY Left 06/04/2019   Procedure: LEFT MASTECTOMY WITH SENTINEL LYMPH NODE BIOPSY;  Surgeon: [name], MD;  Location: [place];  Service: General;  Laterality: Left;   MASTECTOMY, PARTIAL  2008   GOT PACEMAKER AND DEFIB AT THAT TIME   PACEMAKER INSERTION  04/23/06   TOTAL KNEE ARTHROPLASTY  05/17/01   RIGHT KNEE   TOTAL KNEE ARTHROPLASTY Left 11/29/2014   Procedure: TOTAL LEFT KNEE ARTHROPLASTY;  Surgeon: Paralee Cancel, MD;  Location: [facility];  Service: Orthopedics;  Laterality: Left;   Patient Active Problem List   Diagnosis Date Noted   Sciatica associated with disorder of multiple sites of spine 03/20/2021   Bipolar II disorder ([place]) 03/20/2021   Chronic pain syndrome 03/20/2021   Dementia without behavioral disturbance ([place]) 02/04/2020   Urinary retention 02/04/2020   Seasonal allergies 02/04/2020   Recurrent major depressive disorder, in partial remission ([place]) 07/17/2019   Status post left mastectomy 07/01/2019   Breast cancer of lower-outer quadrant of left female breast ([place]) 06/04/2019   Candida infection, oral 01/15/2019   Senile purpura ([place]) 04/14/2018   Lumbar post-laminectomy syndrome 12/03/2017   Lumbar spondylosis 12/03/2017   History of back surgery 09/01/2017   Constipation 09/01/2017   Hypothyroidism due to acquired atrophy of thyroid 09/01/2017   Mixed hyperlipidemia 09/01/2017   Age-related osteoporosis  without current pathological fracture 09/01/2017   High risk medication use 09/01/2017   Arthritis of hand 05/17/2017   Atherosclerosis of native arteries of extremity with intermittent claudication ([place]) 05/15/2017   Status post total bilateral knee replacement 12/20/2016   Gait abnormality 07/16/2016   Chronic low back pain 07/16/2016   Mild cognitive impairment 07/16/2016   Wrist pain 05/10/2015   S/P left TKA 11/29/2014   S/P knee replacement 11/29/2014   Spontaneous bruising 08/27/2014   Numbness and tingling in right hand 07/28/2014   Essential tremor 07/28/2014   Dizziness 05/28/2014   Shaky 05/28/2014   Memory loss 05/28/2014   Depression 05/06/2014   Lipoma of left upper thigh 3x5 cm 09/28/2011   Cerebral vascular accident ([place]) 08/09/2011   Syncope 08/09/2011   History of breast cancer [id number], s/p BCT 2008, triple negative 01/26/2011   Chronic L breast pain with chronic recurrent seroma, s/p excisional biopsy 01/12/2010 01/26/2011   Fainted    ICD (implantable cardioverter-defibrillator), biventricular, in situ 22/05/5425   Chronic systolic heart failure ([place]) 08/02/2010   Essential hypertension 08/02/2010   REFERRING DIAG: N39.45 (ICD-10-CM) - Continuous leakage   THERAPY DIAG:  Muscle weakness (generalized)   Cramp and spasm   Other abnormalities of gait and mobility   Other lack of coordination   Rationale for Evaluation and Treatment: Rehabilitation   ONSET DATE: 6 months ago   SUBJECTIVE:                                                                                                                                                                                            SUBJECTIVE STATEMENT: Patient states she has been doing well since last visit.  She    PAIN:  03/19/2022; Are you having pain?Not right now  PRECAUTIONS: ICD/Pacemaker, left breast cancer   WEIGHT BEARING RESTRICTIONS: No   FALLS:  Has patient fallen in last 6 months? No    LIVING ENVIRONMENT: Lives with: lives with their family     OCCUPATION: retired   PLOF: Independent   PATIENT GOALS: reduce leakage   PERTINENT HISTORY:  Left breast cancer; CHF; Hypothyroidism; Pacemaker; Lumbar fusion; Left knee arthroplasty     URINATION: Pain with urination: No Fully empty bladder: Yes: sometimes, after she is done urinating she feels like there is still some urine remaining Stream: Strong, sometimes has a trickling flow Urgency: Yes:   Frequency: day time voids 4 night times every 2 hours or 1 hour Leakage:  does not feel the leakage Pads: Yes: 2 pads per day, night wears a depends with toilet paper, 3 times at night   PREGNANCY: Vaginal deliveries 1     PROLAPSE: Cystocele   and Urethrocele      OBJECTIVE: (objective measures completed at initial evaluation unless otherwise dated)   DIAGNOSTIC FINDINGS:  Pessary fittings     COGNITION: Overall cognitive status: Within functional limits for tasks assessed                          SENSATION: Light touch: Appears intact Proprioception: Appears intact     FUNCTIONAL TESTS:  5 times sit to stand: 20 sec; 03/19/2022 13 sec but felt urinary leakage while doing the task.  Timed up and go (TUG): 15 sec 03/09/2022: 16 sec, 13 sec, 11 sec   GAIT: Assistive device utilized: Single point cane, when she is going out Level of assistance: Complete Independence Comments: small steps and slowly    POSTURE: No Significant postural limitations   PELVIC ALIGNMENT:     LOWER EXTREMITY ROM:   Passive ROM Right eval Left eval  Hip external rotation 25 45   (Blank rows = not tested)   LOWER EXTREMITY MMT:   MMT Right eval Left eval  Hip extension 3/5 3/5  Hip abduction 3/5 3/5  Hip adduction 3/5 3/5    PALPATION:    External Perineal Exam some redness in the vulva area                             Internal Pelvic Floor tightness in the introitus. She would contract her gluteal more than the  vaginal but after tactile cues was able to isolate the vaginal canal.    Patient confirms identification and approves PT to assess internal pelvic floor and treatment Yes   PELVIC MMT:   MMT eval 03/09/2022 03/28/22  Vaginal 1/5 2/5 3/5 with weak hug        TONE: Low tone   PROLAPSE: Had pessary in   TODAY'S TREATMENT:   04/04/22: Nustep x 6 min level 2 Seated ball squeeze x 20 Seated ball push down on knees to focus on pelvic floor contraction 2 x 10 Sit to stand 2 x 10 Seated clam with blue loop 2 x 10 Seated hamstring curl with red band 2 x 10 each LE Seated LAQ with 2 lbs 2 x 10 each Seated march x 20 with 2 lbs Seated hip ER with 2 lbs x 10 each LE Seated mini sit ups holding red plyo ball 2 x 10 Seated modified russian twist x 20 with red plyo ball Seated shoulder to hip x 10 each side  03/28/22 After nustep BP was  154/77 and pulse is 68 Exercises: Strengthening:  Nustep: New model: L5 x 5 min with concurrent discuss of pain & status Bridge with ball and pelvic floor contraction 10x Horizontal abduction with yellow band and ball squeeze to contract the pelvic floor 10x Supine bilateral shoulder flexion holding yellow band with ball squeeze and pelvic floor contraction Supine pelvic floor contraction with tactile cues holding 5 sec 10x Sitting pelvic floor contraction holding 5 sec 10x      03/26/22: Nustep x 6 min level 2 Standing hip flexor & hamstring stretch on stair 2x10 sec Bil Supine ball squeeze x 20 Sit to stand 2 x 10 Seated clam with blue loop  x 10  Seated hamstring curl with yellow band 2 x 10 each LE Seated LAQ with 2 lbs 2 x 10 each Seated mini sit ups holding red plyo ball 2 x 10 Standing hip abduction and extension 10x Bil at [facility] Supine bridge with ball squeeze 10x     PATIENT EDUCATION:  03/28/2022 Education details: Access Code: [email or web address] Person educated: Patient Education method: Consulting civil engineer, Demonstration, Corporate treasurer cues, Verbal cues,  and Handouts Education comprehension: verbalized understanding, returned demonstration, verbal cues required, tactile cues required, and needs further education   HOME EXERCISE PROGRAM: 03/28/22  Access Code: [email or web address] URL: [email or web address] Date: 03/28/2022 Prepared by: Earlie Counts  Exercises - Supine Pelvic Floor Contraction  - 3 x daily - 7 x weekly - 1 sets - 10 reps - 5 sec hold - Supine Piriformis Stretch with Foot on Ground  - 1 x daily - 7 x weekly - 2 reps - 20 hold - Supine Bridge with Pelvic Floor Contraction  - 1 x daily - 7 x weekly - 3 sets - 10 reps - Seated Long Arc Quad  - 1 x daily - 7 x weekly - 2 sets - 10 reps - Seated March  - 1 x daily - 7 x weekly - 2 sets - 10 reps - Seated Abdominal Press into [employer]  - 1 x daily - 7 x weekly - 1 sets - 10 reps - Supine Shoulder Horizontal Abduction with Resistance  - 1 x daily - 3 x weekly - 1 sets - 10 reps - Supine Shoulder Flexion Extension AAROM with [place]el  - 1 x daily - 3 x weekly - 1 sets - 10 reps - Seated Pelvic Floor Contraction  - 3 x daily - 7 x weekly - 1 sets - 10 reps - 5 sec hold    ASSESSMENT:   CLINICAL IMPRESSION: Patient is progressing appropriately.  She was able to tolerate some higher level core work with mod fatigue.  She is well motivated and compliant.  She should continue to improve.  Patient will benefit from skilled therapy to improve balance, strength and pelvic floor coordination to reduce leakage and walk safely to the bathroom.    OBJECTIVE IMPAIRMENTS: decreased activity tolerance, decreased coordination, decreased endurance, decreased mobility, difficulty walking, decreased strength, and increased fascial restrictions.    ACTIVITY LIMITATIONS: standing, transfers, continence, toileting, and locomotion level   PARTICIPATION LIMITATIONS: shopping and community activity   PERSONAL FACTORS: Age, Fitness, Past/current experiences, and 1-2 comorbidities: Left breast cancer;  CHF; Hypothyroidism; Pacemaker; Lumbar fusion; Left knee arthroplasty  are also affecting patient's functional outcome.    REHAB POTENTIAL: Good   CLINICAL DECISION MAKING: Evolving/moderate complexity   EVALUATION COMPLEXITY: Moderate     GOALS: Goals reviewed with patient? Yes   SHORT TERM GOALS: Target date: 03/23/2022  Patient independent with initial HEP for pelvic floor contraction.  Baseline: Goal status: Goal met 03/05/22   2.  Patient is able to feel the urinary leakage due to improve pelvic floor tone.  Baseline:  Goal status: Met 03/19/2022   3.  Patient educated on the urge to void to be able to walk to the bathroom slowl Baseline:  Goal status: INITIAL   4.  Timed up and go </= 14 sec Baseline:  Goal status: Met 03/09/2022   5.  Sit to stand </- 18 sec  Baseline:  Goal status: Met 03/19/2020     LONG TERM GOALS: Target date: 05/18/2021   Patient independent with advanced hep for pelvic floor strength and balance exercises.  Baseline:  Goal status: ongoing 03/28/22   2.  Patient reports her urinary leakage is </= 50% due to improved pelvic floor strength >/ 3/5 Baseline: Patient reports her pads are 30% dryer.  Goal status: ongoing 03/28/22   3.  Time up and go test is </= 13 sec. So she is able to walk to the commode safely when she has the urge to urinate.  Baseline:  Goal status: INITIAL   4.  Sit to stand score is </= 15 sec so she is able to safely get out of the chair or get up from the commode.  Baseline:  Goal status: INITIAL   5.  Patient is able to wake up 2 times at night instead of every hour due to increased in pelvic floor strength and understanding on how to use the urge to void behavioral technique.  Baseline: when not drinking much during the day wake up every 2 hours and increase her drinking it is every hour Goal status: ongoing 03/28/22     PLAN:   PT FREQUENCY: 1-2x/week   PT DURATION: 12 weeks   PLANNED INTERVENTIONS:  Therapeutic exercises, Therapeutic activity, Neuromuscular re-education, Balance training, Gait training, Patient/Family education, Self Care, Biofeedback, and Manual therapy   PLAN FOR NEXT SESSION: Continue with hip strength: consider adding standing hip exercises to incorporate balance. Sit to stand without gluteal contraction just pelvic floor contraction.   Anderson Malta B. Saber Dickerman, PT 04/04/22 11:47 AM  [facility] [address] [place], [place] [address] Phone # [phone] Fax [phone]

## 2022-04-11 ENCOUNTER — Encounter: Payer: Self-pay | Admitting: Physical Therapy

## 2022-04-11 ENCOUNTER — Ambulatory Visit: Payer: Medicare Other | Attending: General Surgery | Admitting: Physical Therapy

## 2022-04-11 DIAGNOSIS — R278 Other lack of coordination: Secondary | ICD-10-CM | POA: Insufficient documentation

## 2022-04-11 DIAGNOSIS — R293 Abnormal posture: Secondary | ICD-10-CM | POA: Diagnosis present

## 2022-04-11 DIAGNOSIS — R262 Difficulty in walking, not elsewhere classified: Secondary | ICD-10-CM | POA: Insufficient documentation

## 2022-04-11 DIAGNOSIS — R2689 Other abnormalities of gait and mobility: Secondary | ICD-10-CM | POA: Diagnosis present

## 2022-04-11 DIAGNOSIS — R252 Cramp and spasm: Secondary | ICD-10-CM | POA: Diagnosis present

## 2022-04-11 DIAGNOSIS — M6281 Muscle weakness (generalized): Secondary | ICD-10-CM | POA: Insufficient documentation

## 2022-04-11 NOTE — Therapy (Signed)
OUTPATIENT PHYSICAL THERAPY TREATMENT NOTE   Patient Name: [name] MRN: [id number] DOB:Nov 30, 1933, 89 y.o., female Today's Date: 04/11/2022  PCP: [name], NP  REFERRING PROVIDER: [name], MD   END OF SESSION:   PT End of Session - 04/11/22 0933     Visit Number 11    Date for PT Re-Evaluation 05/18/22    Authorization Type [employer]    Authorization - Visit Number 11    Authorization - Number of Visits 20    Progress Note Due on Visit 20    PT Start Time 0930    PT Stop Time 1010    PT Time Calculation (min) 40 min    Activity Tolerance Patient tolerated treatment well    Behavior During Therapy WFL for tasks assessed/performed             Past Medical History:  Diagnosis Date   Arthritis    Cancer ([place])    left breast cancer    Cataracts, bilateral    removed by surgery   CHF (congestive heart failure) ([place])    PACEMAKER & DEFIB   Complication of anesthesia    hypotensive after back surgery in 2006   Depression    Dyslipidemia    Fainted 04/21/06   AT [facility]   GERD (gastroesophageal reflux disease)    Headache(784.0)    Hearing loss    bilateral hearing aids   HLD (hyperlipidemia)    diet controlled    Hypertension    Hypothyroidism    ICD (implantable cardiac defibrillator) in place    pt has pacer/icd   ICD (implantable cardiac defibrillator), biventricular, in situ    LBBB (left bundle branch block)    Memory loss    Nonischemic cardiomyopathy ([place])    Normal coronary arteries    s/p cardiac cath 2007   Pacemaker    ICD [employer]   Syncope    Systolic CHF [facility])    Vertigo    Wears glasses    Past Surgical History:  Procedure Laterality Date   BACK SURGERY     lumbar fusion    BREAST LUMPECTOMY Left 2008   BREAST SURGERY  2000   LUMP REMOVAL. STAGE 1 CANCER   CARDIAC CATHETERIZATION     CATARACT EXTRACTION     COLONOSCOPY     EYE SURGERY     IMPLANTABLE CARDIOVERTER DEFIBRILLATOR GENERATOR CHANGE N/A  12/18/2012   Procedure: IMPLANTABLE CARDIOVERTER DEFIBRILLATOR GENERATOR CHANGE;  Surgeon: [name], MD;  Location: [facility] CATH LAB;  Service: Cardiovascular;  Laterality: N/A;   JOINT REPLACEMENT  06/14/01   right   LUMBAR FUSION  2006   MASS EXCISION  11/08/2011   Procedure: EXCISION MASS;  Surgeon: [name], MD;  Location: [facility] ORS;  Service: General;  Laterality: Left;  Excision Left Thigh Mass   MASTECTOMY W/ SENTINEL NODE BIOPSY Left 06/04/2019   Procedure: LEFT MASTECTOMY WITH SENTINEL LYMPH NODE BIOPSY;  Surgeon: [name], MD;  Location: [place];  Service: General;  Laterality: Left;   MASTECTOMY, PARTIAL  2008   GOT PACEMAKER AND DEFIB AT THAT TIME   PACEMAKER INSERTION  04/23/06   TOTAL KNEE ARTHROPLASTY  05/17/01   RIGHT KNEE   TOTAL KNEE ARTHROPLASTY Left 11/29/2014   Procedure: TOTAL LEFT KNEE ARTHROPLASTY;  Surgeon: Paralee Cancel, MD;  Location: [facility];  Service: Orthopedics;  Laterality: Left;   Patient Active Problem List   Diagnosis Date Noted   Sciatica associated with disorder of  multiple sites of spine 03/20/2021   Bipolar II disorder ([place]) 03/20/2021   Chronic pain syndrome 03/20/2021   Dementia without behavioral disturbance ([place]) 02/04/2020   Urinary retention 02/04/2020   Seasonal allergies 02/04/2020   Recurrent major depressive disorder, in partial remission ([place]) 07/17/2019   Status post left mastectomy 07/01/2019   Breast cancer of lower-outer quadrant of left female breast ([place]) 06/04/2019   Candida infection, oral 01/15/2019   Senile purpura ([place]) 04/14/2018   Lumbar post-laminectomy syndrome 12/03/2017   Lumbar spondylosis 12/03/2017   History of back surgery 09/01/2017   Constipation 09/01/2017   Hypothyroidism due to acquired atrophy of thyroid 09/01/2017   Mixed hyperlipidemia 09/01/2017   Age-related osteoporosis without current pathological fracture 09/01/2017   High risk medication use 09/01/2017   Arthritis of hand 05/17/2017   Atherosclerosis  of native arteries of extremity with intermittent claudication ([place]) 05/15/2017   Status post total bilateral knee replacement 12/20/2016   Gait abnormality 07/16/2016   Chronic low back pain 07/16/2016   Mild cognitive impairment 07/16/2016   Wrist pain 05/10/2015   S/P left TKA 11/29/2014   S/P knee replacement 11/29/2014   Spontaneous bruising 08/27/2014   Numbness and tingling in right hand 07/28/2014   Essential tremor 07/28/2014   Dizziness 05/28/2014   Shaky 05/28/2014   Memory loss 05/28/2014   Depression 05/06/2014   Lipoma of left upper thigh 3x5 cm 09/28/2011   Cerebral vascular accident ([place]) 08/09/2011   Syncope 08/09/2011   History of breast cancer [id number], s/p BCT 2008, triple negative 01/26/2011   Chronic L breast pain with chronic recurrent seroma, s/p excisional biopsy 01/12/2010 01/26/2011   Fainted    ICD (implantable cardioverter-defibrillator), biventricular, in situ 48/54/6270   Chronic systolic heart failure ([place]) 08/02/2010   Essential hypertension 08/02/2010   REFERRING DIAG: N39.45 (ICD-10-CM) - Continuous leakage   THERAPY DIAG:  Muscle weakness (generalized)   Cramp and spasm   Other abnormalities of gait and mobility   Other lack of coordination   Rationale for Evaluation and Treatment: Rehabilitation   ONSET DATE: 6 months ago   SUBJECTIVE:                                                                                                                                                                                            SUBJECTIVE STATEMENT: Patient is able to walk to the bathroom quicker. I will have leakage when I ignore the urge to urinate. I am going to the bathroom every 2 hours. I am not wearing the pessary anymore due to the tissue being inflamed.     PAIN:  03/19/2022; Are  you having pain?Not right now     PRECAUTIONS: ICD/Pacemaker, left breast cancer   WEIGHT BEARING RESTRICTIONS: No   FALLS:  Has patient fallen in last  6 months? No   LIVING ENVIRONMENT: Lives with: lives with their family     OCCUPATION: retired   PLOF: Independent   PATIENT GOALS: reduce leakage   PERTINENT HISTORY:  Left breast cancer; CHF; Hypothyroidism; Pacemaker; Lumbar fusion; Left knee arthroplasty     URINATION: Pain with urination: No Fully empty bladder: Yes: sometimes, after she is done urinating she feels like there is still some urine remaining Stream: Strong, sometimes has a trickling flow Urgency: Yes:   Frequency: day time voids 4 night times every 2 hours or 1 hour Leakage:  does not feel the leakage Pads: Yes: 2 pads per day, night wears a depends with toilet paper, 3 times at night   PREGNANCY: Vaginal deliveries 1     PROLAPSE: Cystocele   and Urethrocele      OBJECTIVE: (objective measures completed at initial evaluation unless otherwise dated)   DIAGNOSTIC FINDINGS:  Pessary fittings     COGNITION: Overall cognitive status: Within functional limits for tasks assessed                          SENSATION: Light touch: Appears intact Proprioception: Appears intact     FUNCTIONAL TESTS:  5 times sit to stand: 20 sec; 03/19/2022 13 sec but felt urinary leakage while doing the task.  Timed up and go (TUG): 15 sec 03/09/2022: 16 sec, 13 sec, 11 sec   GAIT: Assistive device utilized: Single point cane, when she is going out Level of assistance: Complete Independence Comments: small steps and slowly    POSTURE: No Significant postural limitations   PELVIC ALIGNMENT:     LOWER EXTREMITY ROM:   Passive ROM Right eval Left eval  Hip external rotation 25 45   (Blank rows = not tested)   LOWER EXTREMITY MMT:   MMT Right eval Left eval Right 1/3/224 Left 04/11/22  Hip extension 3/5 3/5 4-/5 4-/5  Hip abduction 3/5 3/5 4-/5 4-/5  Hip adduction 3/5 3/5 5/5 5/5    PALPATION:    External Perineal Exam some redness in the vulva area                             Internal Pelvic Floor  tightness in the introitus. She would contract her gluteal more than the vaginal but after tactile cues was able to isolate the vaginal canal.    Patient confirms identification and approves PT to assess internal pelvic floor and treatment Yes   PELVIC MMT:   MMT eval 03/09/2022 03/28/22  Vaginal 1/5 2/5 3/5 with weak hug        TONE: Low tone   PROLAPSE: Had pessary in   TODAY'S TREATMENT:   04/11/2022 Exercises: Strengthening: Nustep x 7 min level 4 while therapist assesses patient Discussed with patient to go to the bathroom every 2 hours to improved the endurance of the pelvic floor muscles.  Supine pelvic floor contraction 5 sec x 1, 10 sec x 5; 5 quick contractions Supine pelvic floor contraction with elevator 5 times going up and down 3 levels.  Supine hip flexion with pelvic floor contraction 10x each leg Sitting press hands into the thighs and contract the pelvic floor holding 5 sec 10x     04/04/22:  Nustep x 6 min level 2 Seated ball squeeze x 20 Seated ball push down on knees to focus on pelvic floor contraction 2 x 10 Sit to stand 2 x 10 Seated clam with blue loop 2 x 10 Seated hamstring curl with red band 2 x 10 each LE Seated LAQ with 2 lbs 2 x 10 each Seated march x 20 with 2 lbs Seated hip ER with 2 lbs x 10 each LE Seated mini sit ups holding red plyo ball 2 x 10 Seated modified russian twist x 20 with red plyo ball Seated shoulder to hip x 10 each side   03/28/22 After nustep BP was 154/77 and pulse is 68 Exercises: Strengthening:  Nustep: New model: L5 x 5 min with concurrent discuss of pain & status Bridge with ball and pelvic floor contraction 10x Horizontal abduction with yellow band and ball squeeze to contract the pelvic floor 10x Supine bilateral shoulder flexion holding yellow band with ball squeeze and pelvic floor contraction Supine pelvic floor contraction with tactile cues holding 5 sec 10x Sitting pelvic floor contraction holding 5 sec  10x       PATIENT EDUCATION:  03/28/2022 Education details: Access Code: [email or web address] Person educated: Patient Education method: Consulting civil engineer, Demonstration, Corporate treasurer cues, Verbal cues, and Handouts Education comprehension: verbalized understanding, returned demonstration, verbal cues required, tactile cues required, and needs further education   HOME EXERCISE PROGRAM: 03/28/22  Access Code: [email or web address] URL: [email or web address] Date: 03/28/2022 Prepared by: Earlie Counts   Exercises - Supine Pelvic Floor Contraction  - 3 x daily - 7 x weekly - 1 sets - 10 reps - 5 sec hold - Supine Piriformis Stretch with Foot on Ground  - 1 x daily - 7 x weekly - 2 reps - 20 hold - Supine Bridge with Pelvic Floor Contraction  - 1 x daily - 7 x weekly - 3 sets - 10 reps - Seated Long Arc Quad  - 1 x daily - 7 x weekly - 2 sets - 10 reps - Seated March  - 1 x daily - 7 x weekly - 2 sets - 10 reps - Seated Abdominal Press into [employer]  - 1 x daily - 7 x weekly - 1 sets - 10 reps - Supine Shoulder Horizontal Abduction with Resistance  - 1 x daily - 3 x weekly - 1 sets - 10 reps - Supine Shoulder Flexion Extension AAROM with Dowel  - 1 x daily - 3 x weekly - 1 sets - 10 reps - Seated Pelvic Floor Contraction  - 3 x daily - 7 x weekly - 1 sets - 10 reps - 5 sec hold     ASSESSMENT:   CLINICAL IMPRESSION: Patient is progressing appropriately.  Patient is able to do the Nustep for 7 minutes at higher resistance of 4 without fatigue.  Patient is using her cane less at home due to improved strength and balance. Patient hip strength increased to 4-/5. Patient wears 3-4 pads per day. At night she wakes up 3-4 times. Therapist was able to feel the patient contract her pelvic floor with her exercises in supine. Patient will benefit from skilled therapy to improve balance, strength and pelvic floor coordination to reduce leakage and walk safely to the bathroom.    OBJECTIVE IMPAIRMENTS: decreased  activity tolerance, decreased coordination, decreased endurance, decreased mobility, difficulty walking, decreased strength, and increased fascial restrictions.    ACTIVITY LIMITATIONS: standing, transfers, continence, toileting, and locomotion level   PARTICIPATION LIMITATIONS: shopping and community  activity   PERSONAL FACTORS: Age, Fitness, Past/current experiences, and 1-2 comorbidities: Left breast cancer; CHF; Hypothyroidism; Pacemaker; Lumbar fusion; Left knee arthroplasty  are also affecting patient's functional outcome.    REHAB POTENTIAL: Good   CLINICAL DECISION MAKING: Evolving/moderate complexity   EVALUATION COMPLEXITY: Moderate     GOALS: Goals reviewed with patient? Yes   SHORT TERM GOALS: Target date: 03/23/2022   Patient independent with initial HEP for pelvic floor contraction.  Baseline: Goal status: Goal met 03/05/22   2.  Patient is able to feel the urinary leakage due to improve pelvic floor tone.  Baseline:  Goal status: Met 03/19/2022   3.  Patient educated on the urge to void to be able to walk to the bathroom slowl Baseline:  Goal status: Met 04/11/22   4.  Timed up and go </= 14 sec Baseline:  Goal status: Met 03/09/2022   5.  Sit to stand </- 18 sec  Baseline:  Goal status: Met 03/19/2020     LONG TERM GOALS: Target date: 05/18/2021   Patient independent with advanced hep for pelvic floor strength and balance exercises.  Baseline:  Goal status: ongoing 03/28/22   2.  Patient reports her urinary leakage is </= 50% due to improved pelvic floor strength >/ 3/5 Baseline: Patient reports her pads are 30% dryer.  Goal status: ongoing 03/28/22   3.  Time up and go test is </= 13 sec. So she is able to walk to the commode safely when she has the urge to urinate.  Baseline:  Goal status: INITIAL   4.  Sit to stand score is </= 15 sec so she is able to safely get out of the chair or get up from the commode.  Baseline:  Goal status: INITIAL   5.   Patient is able to wake up 2 times at night instead of every hour due to increased in pelvic floor strength and understanding on how to use the urge to void behavioral technique.  Baseline: when not drinking much during the day wake up every 2 hours and increase her drinking it is every hour Goal status: ongoing 03/28/22     PLAN:   PT FREQUENCY: 1-2x/week   PT DURATION: 12 weeks   PLANNED INTERVENTIONS: Therapeutic exercises, Therapeutic activity, Neuromuscular re-education, Balance training, Gait training, Patient/Family education, Self Care, Biofeedback, and Manual therapy   PLAN FOR NEXT SESSION: Continue with hip strength: consider adding standing hip exercises to incorporate balance. All exercises should include pelvic floor contraction Earlie Counts, PT 04/11/22 10:12 AM

## 2022-04-11 NOTE — Therapy (Unsigned)
OUTPATIENT PHYSICAL THERAPY TREATMENT NOTE   Patient Name: [name] MRN: [id number] DOB:08-26-1933, 89 y.o., female Today's Date: 04/12/2022  PCP: [name], NP   REFERRING PROVIDER: [name], MD   END OF SESSION:   PT End of Session - 04/12/22 1624     Visit Number 12    Date for PT Re-Evaluation 05/18/22    Authorization Type [employer]    Authorization - Visit Number 12    Authorization - Number of Visits 20    Progress Note Due on Visit 20    PT Start Time 9937    PT Stop Time 1696    PT Time Calculation (min) 45 min    Activity Tolerance Patient tolerated treatment well    Behavior During Therapy WFL for tasks assessed/performed              Past Medical History:  Diagnosis Date   Arthritis    Cancer ([place])    left breast cancer    Cataracts, bilateral    removed by surgery   CHF (congestive heart failure) ([place])    PACEMAKER & DEFIB   Complication of anesthesia    hypotensive after back surgery in 2006   Depression    Dyslipidemia    Fainted 04/21/06   AT [facility]   GERD (gastroesophageal reflux disease)    Headache(784.0)    Hearing loss    bilateral hearing aids   HLD (hyperlipidemia)    diet controlled    Hypertension    Hypothyroidism    ICD (implantable cardiac defibrillator) in place    pt has pacer/icd   ICD (implantable cardiac defibrillator), biventricular, in situ    LBBB (left bundle branch block)    Memory loss    Nonischemic cardiomyopathy ([place])    Normal coronary arteries    s/p cardiac cath 2007   Pacemaker    ICD [employer]   Syncope    Systolic CHF [facility])    Vertigo    Wears glasses    Past Surgical History:  Procedure Laterality Date   BACK SURGERY     lumbar fusion    BREAST LUMPECTOMY Left 2008   BREAST SURGERY  2000   LUMP REMOVAL. STAGE 1 CANCER   CARDIAC CATHETERIZATION     CATARACT EXTRACTION     COLONOSCOPY     EYE SURGERY     IMPLANTABLE CARDIOVERTER DEFIBRILLATOR GENERATOR CHANGE N/A  12/18/2012   Procedure: IMPLANTABLE CARDIOVERTER DEFIBRILLATOR GENERATOR CHANGE;  Surgeon: [name], MD;  Location: [facility] CATH LAB;  Service: Cardiovascular;  Laterality: N/A;   JOINT REPLACEMENT  06/14/01   right   LUMBAR FUSION  2006   MASS EXCISION  11/08/2011   Procedure: EXCISION MASS;  Surgeon: [name], MD;  Location: [facility];  Service: General;  Laterality: Left;  Excision Left Thigh Mass   MASTECTOMY W/ SENTINEL NODE BIOPSY Left 06/04/2019   Procedure: LEFT MASTECTOMY WITH SENTINEL LYMPH NODE BIOPSY;  Surgeon: [name], MD;  Location: [facility];  Service: General;  Laterality: Left;   MASTECTOMY, PARTIAL  2008   GOT PACEMAKER AND DEFIB AT THAT TIME   PACEMAKER INSERTION  04/23/06   TOTAL KNEE ARTHROPLASTY  05/17/01   RIGHT KNEE   TOTAL KNEE ARTHROPLASTY Left 11/29/2014   Procedure: TOTAL LEFT KNEE ARTHROPLASTY;  Surgeon: Paralee Cancel, MD;  Location: [facility];  Service: Orthopedics;  Laterality: Left;   Patient Active Problem List   Diagnosis Date Noted   Sciatica associated with  disorder of multiple sites of spine 03/20/2021   Bipolar II disorder ([place]) 03/20/2021   Chronic pain syndrome 03/20/2021   Dementia without behavioral disturbance ([place]) 02/04/2020   Urinary retention 02/04/2020   Seasonal allergies 02/04/2020   Recurrent major depressive disorder, in partial remission ([place]) 07/17/2019   Status post left mastectomy 07/01/2019   Breast cancer of lower-outer quadrant of left female breast ([place]) 06/04/2019   Candida infection, oral 01/15/2019   Senile purpura ([place]) 04/14/2018   Lumbar post-laminectomy syndrome 12/03/2017   Lumbar spondylosis 12/03/2017   History of back surgery 09/01/2017   Constipation 09/01/2017   Hypothyroidism due to acquired atrophy of thyroid 09/01/2017   Mixed hyperlipidemia 09/01/2017   Age-related osteoporosis without current pathological fracture 09/01/2017   High risk medication use 09/01/2017   Arthritis of hand 05/17/2017   Atherosclerosis  of native arteries of extremity with intermittent claudication ([place]) 05/15/2017   Status post total bilateral knee replacement 12/20/2016   Gait abnormality 07/16/2016   Chronic low back pain 07/16/2016   Mild cognitive impairment 07/16/2016   Wrist pain 05/10/2015   S/P left TKA 11/29/2014   S/P knee replacement 11/29/2014   Spontaneous bruising 08/27/2014   Numbness and tingling in right hand 07/28/2014   Essential tremor 07/28/2014   Dizziness 05/28/2014   Shaky 05/28/2014   Memory loss 05/28/2014   Depression 05/06/2014   Lipoma of left upper thigh 3x5 cm 09/28/2011   Cerebral vascular accident ([place]) 08/09/2011   Syncope 08/09/2011   History of breast cancer [id number], s/p BCT 2008, triple negative 01/26/2011   Chronic L breast pain with chronic recurrent seroma, s/p excisional biopsy 01/12/2010 01/26/2011   Fainted    ICD (implantable cardioverter-defibrillator), biventricular, in situ 35/32/9924   Chronic systolic heart failure ([place]) 08/02/2010   Essential hypertension 08/02/2010   REFERRING DIAG: N39.45 (ICD-10-CM) - Continuous leakage   THERAPY DIAG:  Muscle weakness (generalized)   Cramp and spasm   Other abnormalities of gait and mobility   Other lack of coordination   Rationale for Evaluation and Treatment: Rehabilitation   ONSET DATE: 6 months ago   SUBJECTIVE:                                                                                                                                                                                            SUBJECTIVE STATEMENT: Patient states that she is doing well. She denies any falls, but continues to report difficulty with making it to the bathroom when she has urgency. She states that she does not feel as though its due to weakness in her bilat LE's but rather continued weakness in  her pelvic floor.    PAIN:  03/19/2022; Are you having pain?Not right now     PRECAUTIONS: ICD/Pacemaker, left breast cancer   WEIGHT  BEARING RESTRICTIONS: No   FALLS:  Has patient fallen in last 6 months? No   LIVING ENVIRONMENT: Lives with: lives with their family     OCCUPATION: retired   PLOF: Independent   PATIENT GOALS: reduce leakage   PERTINENT HISTORY:  Left breast cancer; CHF; Hypothyroidism; Pacemaker; Lumbar fusion; Left knee arthroplasty     URINATION: Pain with urination: No Fully empty bladder: Yes: sometimes, after she is done urinating she feels like there is still some urine remaining Stream: Strong, sometimes has a trickling flow Urgency: Yes:   Frequency: day time voids 4 night times every 2 hours or 1 hour Leakage:  does not feel the leakage Pads: Yes: 2 pads per day, night wears a depends with toilet paper, 3 times at night   PREGNANCY: Vaginal deliveries 1     PROLAPSE: Cystocele   and Urethrocele      OBJECTIVE: (objective measures completed at initial evaluation unless otherwise dated)   DIAGNOSTIC FINDINGS:  Pessary fittings     COGNITION: Overall cognitive status: Within functional limits for tasks assessed                          SENSATION: Light touch: Appears intact Proprioception: Appears intact     FUNCTIONAL TESTS:  5 times sit to stand: 20 sec; 03/19/2022 13 sec but felt urinary leakage while doing the task.  Timed up and go (TUG): 15 sec 03/09/2022: 16 sec, 13 sec, 11 sec   GAIT: Assistive device utilized: Single point cane, when she is going out Level of assistance: Complete Independence Comments: small steps and slowly    POSTURE: No Significant postural limitations   PELVIC ALIGNMENT:     LOWER EXTREMITY ROM:   Passive ROM Right eval Left eval  Hip external rotation 25 45   (Blank rows = not tested)   LOWER EXTREMITY MMT:   MMT Right eval Left eval  Hip extension 3/5 3/5  Hip abduction 3/5 3/5  Hip adduction 3/5 3/5    PALPATION:    External Perineal Exam some redness in the vulva area                             Internal  Pelvic Floor tightness in the introitus. She would contract her gluteal more than the vaginal but after tactile cues was able to isolate the vaginal canal.    Patient confirms identification and approves PT to assess internal pelvic floor and treatment Yes   PELVIC MMT:   MMT eval 03/09/2022 03/28/22  Vaginal 1/5 2/5 3/5 with weak hug        TONE: Low tone   PROLAPSE: Had pessary in   TODAY'S TREATMENT:   04/04/22: Nustep x 6 min level 5 Theragun massage to bilat quads Seated ball squeeze x 20 Sit to stand 2 x 10 Seated clam with blue loop 2 x 10 Seated hamstring curl with red band 2 x 10 each LE Seated LAQ with 2 lbs 2 x 10 each Seated march x 20 with 2 lbs Seated hip ER with 2 lbs x 10 each LE Modified quad stretch 2x30 sec    03/28/22 After nustep BP was 154/77 and pulse is 68 Exercises: Strengthening:  Nustep: New model: L5 x  5 min with concurrent discuss of pain & status Bridge with ball and pelvic floor contraction 10x Horizontal abduction with yellow band and ball squeeze to contract the pelvic floor 10x Supine bilateral shoulder flexion holding yellow band with ball squeeze and pelvic floor contraction Supine pelvic floor contraction with tactile cues holding 5 sec 10x Sitting pelvic floor contraction holding 5 sec 10x      03/26/22: Nustep x 6 min level 2 Standing hip flexor & hamstring stretch on stair 2x10 sec Bil Supine ball squeeze x 20 Sit to stand 2 x 10 Seated clam with blue loop  x 10  Seated hamstring curl with yellow band 2 x 10 each LE Seated LAQ with 2 lbs 2 x 10 each Seated mini sit ups holding red plyo ball 2 x 10 Standing hip abduction and extension 10x Bil at [facility] Supine bridge with ball squeeze 10x     PATIENT EDUCATION:  03/28/2022 Education details: Access Code: [email or web address] Person educated: Patient Education method: Consulting civil engineer, Demonstration, Corporate treasurer cues, Verbal cues, and Handouts Education comprehension: verbalized understanding,  returned demonstration, verbal cues required, tactile cues required, and needs further education   HOME EXERCISE PROGRAM: 03/28/22  Access Code: [email or web address] URL: [email or web address] Date: 03/28/2022 Prepared by: Earlie Counts  Exercises - Supine Pelvic Floor Contraction  - 3 x daily - 7 x weekly - 1 sets - 10 reps - 5 sec hold - Supine Piriformis Stretch with Foot on Ground  - 1 x daily - 7 x weekly - 2 reps - 20 hold - Supine Bridge with Pelvic Floor Contraction  - 1 x daily - 7 x weekly - 3 sets - 10 reps - Seated Long Arc Quad  - 1 x daily - 7 x weekly - 2 sets - 10 reps - Seated March  - 1 x daily - 7 x weekly - 2 sets - 10 reps - Seated Abdominal Press into [employer]  - 1 x daily - 7 x weekly - 1 sets - 10 reps - Supine Shoulder Horizontal Abduction with Resistance  - 1 x daily - 3 x weekly - 1 sets - 10 reps - Supine Shoulder Flexion Extension AAROM with [place]  - 1 x daily - 3 x weekly - 1 sets - 10 reps - Seated Pelvic Floor Contraction  - 3 x daily - 7 x weekly - 1 sets - 10 reps - 5 sec hold    ASSESSMENT:   CLINICAL IMPRESSION: Patient is progressing appropriately. She reports pain in her bilat knee's despite having knee replacements 10 years ago. Educated pt on self massage and modified quad stretch. She reports a big stretch in her quads, but no increase in pain. Remainder of session with focus on bilat LE strengthening. She is well motivated and compliant. She should continue to improve. Patient will benefit from skilled therapy to improve balance, strength and pelvic floor coordination to reduce leakage and walk safely to the bathroom.    OBJECTIVE IMPAIRMENTS: decreased activity tolerance, decreased coordination, decreased endurance, decreased mobility, difficulty walking, decreased strength, and increased fascial restrictions.    ACTIVITY LIMITATIONS: standing, transfers, continence, toileting, and locomotion level   PARTICIPATION LIMITATIONS: shopping and  community activity   PERSONAL FACTORS: Age, Fitness, Past/current experiences, and 1-2 comorbidities: Left breast cancer; CHF; Hypothyroidism; Pacemaker; Lumbar fusion; Left knee arthroplasty  are also affecting patient's functional outcome.    REHAB POTENTIAL: Good   CLINICAL DECISION MAKING: Evolving/moderate complexity   EVALUATION COMPLEXITY: Moderate     GOALS:  Goals reviewed with patient? Yes   SHORT TERM GOALS: Target date: 03/23/2022   Patient independent with initial HEP for pelvic floor contraction.  Baseline: Goal status: Goal met 03/05/22   2.  Patient is able to feel the urinary leakage due to improve pelvic floor tone.  Baseline:  Goal status: Met 03/19/2022   3.  Patient educated on the urge to void to be able to walk to the bathroom slowl Baseline:  Goal status: INITIAL   4.  Timed up and go </= 14 sec Baseline:  Goal status: Met 03/09/2022   5.  Sit to stand </- 18 sec  Baseline:  Goal status: Met 03/19/2020     LONG TERM GOALS: Target date: 05/18/2021   Patient independent with advanced hep for pelvic floor strength and balance exercises.  Baseline:  Goal status: ongoing 03/28/22   2.  Patient reports her urinary leakage is </= 50% due to improved pelvic floor strength >/ 3/5 Baseline: Patient reports her pads are 30% dryer.  Goal status: ongoing 03/28/22   3.  Time up and go test is </= 13 sec. So she is able to walk to the commode safely when she has the urge to urinate.  Baseline:  Goal status: INITIAL   4.  Sit to stand score is </= 15 sec so she is able to safely get out of the chair or get up from the commode.  Baseline:  Goal status: INITIAL   5.  Patient is able to wake up 2 times at night instead of every hour due to increased in pelvic floor strength and understanding on how to use the urge to void behavioral technique.  Baseline: when not drinking much during the day wake up every 2 hours and increase her drinking it is every  hour Goal status: ongoing 03/28/22     PLAN:   PT FREQUENCY: 1-2x/week   PT DURATION: 12 weeks   PLANNED INTERVENTIONS: Therapeutic exercises, Therapeutic activity, Neuromuscular re-education, Balance training, Gait training, Patient/Family education, Self Care, Biofeedback, and Manual therapy   PLAN FOR NEXT SESSION: Continue with hip strength: consider adding standing hip exercises to incorporate balance. Sit to stand without gluteal contraction just pelvic floor contraction.   Rudi Heap PT, DPT 04/12/22  4:25 PM

## 2022-04-12 ENCOUNTER — Ambulatory Visit: Payer: Medicare Other | Admitting: Physical Therapy

## 2022-04-12 DIAGNOSIS — R293 Abnormal posture: Secondary | ICD-10-CM

## 2022-04-12 DIAGNOSIS — M6281 Muscle weakness (generalized): Secondary | ICD-10-CM

## 2022-04-12 DIAGNOSIS — R2689 Other abnormalities of gait and mobility: Secondary | ICD-10-CM

## 2022-04-12 DIAGNOSIS — R278 Other lack of coordination: Secondary | ICD-10-CM

## 2022-04-12 DIAGNOSIS — R252 Cramp and spasm: Secondary | ICD-10-CM

## 2022-04-13 ENCOUNTER — Other Ambulatory Visit: Payer: Self-pay | Admitting: Nurse Practitioner

## 2022-04-13 DIAGNOSIS — K5904 Chronic idiopathic constipation: Secondary | ICD-10-CM

## 2022-04-16 ENCOUNTER — Ambulatory Visit: Payer: Medicare Other | Admitting: Physical Therapy

## 2022-04-16 ENCOUNTER — Encounter: Payer: Self-pay | Admitting: Physical Therapy

## 2022-04-16 DIAGNOSIS — R278 Other lack of coordination: Secondary | ICD-10-CM

## 2022-04-16 DIAGNOSIS — R2689 Other abnormalities of gait and mobility: Secondary | ICD-10-CM

## 2022-04-16 DIAGNOSIS — R293 Abnormal posture: Secondary | ICD-10-CM

## 2022-04-16 DIAGNOSIS — R262 Difficulty in walking, not elsewhere classified: Secondary | ICD-10-CM

## 2022-04-16 DIAGNOSIS — M6281 Muscle weakness (generalized): Secondary | ICD-10-CM | POA: Diagnosis not present

## 2022-04-16 DIAGNOSIS — R252 Cramp and spasm: Secondary | ICD-10-CM

## 2022-04-16 NOTE — Therapy (Signed)
OUTPATIENT PHYSICAL THERAPY TREATMENT NOTE   Patient Name: [name] MRN: [id number] DOB:24-Apr-1933, 89 y.o., female Today's Date: 04/16/2022  PCP: [name], NP   REFERRING PROVIDER: [name], MD   END OF SESSION:   PT End of Session - 04/16/22 1105     Visit Number 13    Date for PT Re-Evaluation 05/18/22    Authorization Type [employer]    Authorization - Visit Number 36    Authorization - Number of Visits 20    Progress Note Due on Visit 20    PT Start Time 1100    PT Stop Time 1140    PT Time Calculation (min) 40 min    Activity Tolerance Patient tolerated treatment well    Behavior During Therapy WFL for tasks assessed/performed             Past Medical History:  Diagnosis Date   Arthritis    Cancer ([place])    left breast cancer    Cataracts, bilateral    removed by surgery   CHF (congestive heart failure) ([place])    PACEMAKER & DEFIB   Complication of anesthesia    hypotensive after back surgery in 2006   Depression    Dyslipidemia    Fainted 04/21/06   AT [facility]   GERD (gastroesophageal reflux disease)    Headache(784.0)    Hearing loss    bilateral hearing aids   HLD (hyperlipidemia)    diet controlled    Hypertension    Hypothyroidism    ICD (implantable cardiac defibrillator) in place    pt has pacer/icd   ICD (implantable cardiac defibrillator), biventricular, in situ    LBBB (left bundle branch block)    Memory loss    Nonischemic cardiomyopathy ([place])    Normal coronary arteries    s/p cardiac cath 2007   Pacemaker    ICD [employer]   Syncope    Systolic CHF [facility])    Vertigo    Wears glasses    Past Surgical History:  Procedure Laterality Date   BACK SURGERY     lumbar fusion    BREAST LUMPECTOMY Left 2008   BREAST SURGERY  2000   LUMP REMOVAL. STAGE 1 CANCER   CARDIAC CATHETERIZATION     CATARACT EXTRACTION     COLONOSCOPY     EYE SURGERY     IMPLANTABLE CARDIOVERTER DEFIBRILLATOR GENERATOR CHANGE N/A  12/18/2012   Procedure: IMPLANTABLE CARDIOVERTER DEFIBRILLATOR GENERATOR CHANGE;  Surgeon: [name], MD;  Location: [facility] CATH LAB;  Service: Cardiovascular;  Laterality: N/A;   JOINT REPLACEMENT  06/14/01   right   LUMBAR FUSION  2006   MASS EXCISION  11/08/2011   Procedure: EXCISION MASS;  Surgeon: [name], MD;  Location: [facility] ORS;  Service: General;  Laterality: Left;  Excision Left Thigh Mass   MASTECTOMY W/ SENTINEL NODE BIOPSY Left 06/04/2019   Procedure: LEFT MASTECTOMY WITH SENTINEL LYMPH NODE BIOPSY;  Surgeon: [name], MD;  Location: [place];  Service: General;  Laterality: Left;   MASTECTOMY, PARTIAL  2008   GOT PACEMAKER AND DEFIB AT THAT TIME   PACEMAKER INSERTION  04/23/06   TOTAL KNEE ARTHROPLASTY  05/17/01   RIGHT KNEE   TOTAL KNEE ARTHROPLASTY Left 11/29/2014   Procedure: TOTAL LEFT KNEE ARTHROPLASTY;  Surgeon: Paralee Cancel, MD;  Location: [facility];  Service: Orthopedics;  Laterality: Left;   Patient Active Problem List   Diagnosis Date Noted   Sciatica associated with disorder  of multiple sites of spine 03/20/2021   Bipolar II disorder ([place]) 03/20/2021   Chronic pain syndrome 03/20/2021   Dementia without behavioral disturbance ([place]) 02/04/2020   Urinary retention 02/04/2020   Seasonal allergies 02/04/2020   Recurrent major depressive disorder, in partial remission ([place]) 07/17/2019   Status post left mastectomy 07/01/2019   Breast cancer of lower-outer quadrant of left female breast ([place]) 06/04/2019   Candida infection, oral 01/15/2019   Senile purpura ([place]) 04/14/2018   Lumbar post-laminectomy syndrome 12/03/2017   Lumbar spondylosis 12/03/2017   History of back surgery 09/01/2017   Constipation 09/01/2017   Hypothyroidism due to acquired atrophy of thyroid 09/01/2017   Mixed hyperlipidemia 09/01/2017   Age-related osteoporosis without current pathological fracture 09/01/2017   High risk medication use 09/01/2017   Arthritis of hand 05/17/2017   Atherosclerosis  of native arteries of extremity with intermittent claudication ([place]) 05/15/2017   Status post total bilateral knee replacement 12/20/2016   Gait abnormality 07/16/2016   Chronic low back pain 07/16/2016   Mild cognitive impairment 07/16/2016   Wrist pain 05/10/2015   S/P left TKA 11/29/2014   S/P knee replacement 11/29/2014   Spontaneous bruising 08/27/2014   Numbness and tingling in right hand 07/28/2014   Essential tremor 07/28/2014   Dizziness 05/28/2014   Shaky 05/28/2014   Memory loss 05/28/2014   Depression 05/06/2014   Lipoma of left upper thigh 3x5 cm 09/28/2011   Cerebral vascular accident ([place]) 08/09/2011   Syncope 08/09/2011   History of breast cancer [id number], s/p BCT 2008, triple negative 01/26/2011   Chronic L breast pain with chronic recurrent seroma, s/p excisional biopsy 01/12/2010 01/26/2011   Fainted    ICD (implantable cardioverter-defibrillator), biventricular, in situ 25/63/8937   Chronic systolic heart failure ([place]) 08/02/2010   Essential hypertension 08/02/2010   REFERRING DIAG: N39.45 (ICD-10-CM) - Continuous leakage   THERAPY DIAG:  Muscle weakness (generalized)   Cramp and spasm   Other abnormalities of gait and mobility   Other lack of coordination   Rationale for Evaluation and Treatment: Rehabilitation   ONSET DATE: 6 months ago   SUBJECTIVE:                                                                                                                                                                                            SUBJECTIVE STATEMENT: I am having pain in right hip at level 6/10 and started 2 days ago suddenly. I go to urinate so often the pads are not wet.      PAIN: right hip pain level 6/10. Started 2 days ago suddenly. Pain happens with movement.  PRECAUTIONS: ICD/Pacemaker, left breast cancer   WEIGHT BEARING RESTRICTIONS: No   FALLS:  Has patient fallen in last 6 months? No   LIVING ENVIRONMENT: Lives with:  lives with their family     OCCUPATION: retired   PLOF: Independent   PATIENT GOALS: reduce leakage   PERTINENT HISTORY:  Left breast cancer; CHF; Hypothyroidism; Pacemaker; Lumbar fusion; Left knee arthroplasty     URINATION: Pain with urination: No Fully empty bladder: Yes:  Stream: Strong Urgency: Yes:   Frequency: day time voids 4 night times every 2 hours or 1 hour Leakage:  does not feel the leakage Pads: Yes: 2 pads per day, night wears a depends with toilet paper, 3 times at night   PREGNANCY: Vaginal deliveries 1     PROLAPSE: Cystocele   and Urethrocele      OBJECTIVE: (objective measures completed at initial evaluation unless otherwise dated)   DIAGNOSTIC FINDINGS:  Pessary fittings     COGNITION: Overall cognitive status: Within functional limits for tasks assessed                          SENSATION: Light touch: Appears intact Proprioception: Appears intact     FUNCTIONAL TESTS:  5 times sit to stand: 20 sec; 03/19/2022 13 sec but felt urinary leakage while doing the task.  Timed up and go (TUG): 15 sec 03/09/2022: 16 sec, 13 sec, 11 sec   GAIT: Assistive device utilized: Single point cane, when she is going out Level of assistance: Complete Independence Comments: small steps and slowly    POSTURE: No Significant postural limitations   PELVIC ALIGNMENT:     LOWER EXTREMITY ROM:   Passive ROM Right eval Left eval  Hip external rotation 25 45   (Blank rows = not tested)   LOWER EXTREMITY MMT:   MMT Right eval Left eval Right 1/3/224 Left 04/11/22  Hip extension 3/5 3/5 4-/5 4-/5  Hip abduction 3/5 3/5 4-/5 4-/5  Hip adduction 3/5 3/5 5/5 5/5    PALPATION:    External Perineal Exam some redness in the vulva area                             Internal Pelvic Floor tightness in the introitus. She would contract her gluteal more than the vaginal but after tactile cues was able to isolate the vaginal canal.    Patient confirms  identification and approves PT to assess internal pelvic floor and treatment Yes   PELVIC MMT:   MMT eval 03/09/2022 03/28/22  Vaginal 1/5 2/5 3/5 with weak hug        TONE: Low tone   PROLAPSE: Had pessary in   TODAY'S TREATMENT:   04/16/2022 Exercises: Stretches/mobility: Strengthening: Nustep x 6 min level 5 while assessing patient Sitting press ball into legs holding for 5 sec 15 with pelvic floor contraction Sitting lean back holding red plyoball and pelvic floor contraction Seated modified russian twist x 20 with red plyo ball with pelvic floor contraction Sit to stand holding red plyoball with pelvic floor contraction and working on control to sit.  Seated hamstring curl with red band 2 x 10 each LE with ball squeeze and pelvic floor contraction    04/11/2022 Exercises: Strengthening: Nustep x 7 min level 4 while therapist assesses patient Discussed with patient to go to the bathroom every 2 hours to improved the endurance of the pelvic floor muscles.  Supine pelvic floor contraction 5 sec x 1, 10 sec x 5; 5 quick contractions Supine pelvic floor contraction with elevator 5 times going up and down 3 levels.  Supine hip flexion with pelvic floor contraction 10x each leg Sitting press hands into the thighs and contract the pelvic floor holding 5 sec 10x      04/04/22: Nustep x 6 min level 2 Seated ball squeeze x 20 Seated ball push down on knees to focus on pelvic floor contraction 2 x 10 Sit to stand 2 x 10 Seated clam with blue loop 2 x 10 Seated hamstring curl with red band 2 x 10 each LE Seated LAQ with 2 lbs 2 x 10 each Seated march x 20 with 2 lbs Seated hip ER with 2 lbs x 10 each LE Seated mini sit ups holding red plyo ball 2 x 10 Seated modified russian twist x 20 with red plyo ball Seated shoulder to hip x 10 each side   03/28/22 After nustep BP was 154/77 and pulse is 68 Exercises: Strengthening:  Nustep: New model: L5 x 5 min with concurrent discuss  of pain & status Bridge with ball and pelvic floor contraction 10x Horizontal abduction with yellow band and ball squeeze to contract the pelvic floor 10x Supine bilateral shoulder flexion holding yellow band with ball squeeze and pelvic floor contraction Supine pelvic floor contraction with tactile cues holding 5 sec 10x Sitting pelvic floor contraction holding 5 sec 10x        PATIENT EDUCATION:  03/28/2022 Education details: Access Code: [email or web address] Person educated: Patient Education method: Consulting civil engineer, Demonstration, Corporate treasurer cues, Verbal cues, and Handouts Education comprehension: verbalized understanding, returned demonstration, verbal cues required, tactile cues required, and needs further education   HOME EXERCISE PROGRAM: 03/28/22  Access Code: [email or web address] URL: [email or web address] Date: 03/28/2022 Prepared by: Earlie Counts   Exercises - Supine Pelvic Floor Contraction  - 3 x daily - 7 x weekly - 1 sets - 10 reps - 5 sec hold - Supine Piriformis Stretch with Foot on Ground  - 1 x daily - 7 x weekly - 2 reps - 20 hold - Supine Bridge with Pelvic Floor Contraction  - 1 x daily - 7 x weekly - 3 sets - 10 reps - Seated Long Arc Quad  - 1 x daily - 7 x weekly - 2 sets - 10 reps - Seated March  - 1 x daily - 7 x weekly - 2 sets - 10 reps - Seated Abdominal Press into [employer]  - 1 x daily - 7 x weekly - 1 sets - 10 reps - Supine Shoulder Horizontal Abduction with Resistance  - 1 x daily - 3 x weekly - 1 sets - 10 reps - Supine Shoulder Flexion Extension AAROM with Dowel  - 1 x daily - 3 x weekly - 1 sets - 10 reps - Seated Pelvic Floor Contraction  - 3 x daily - 7 x weekly - 1 sets - 10 reps - 5 sec hold     ASSESSMENT:   CLINICAL IMPRESSION: Patient is progressing appropriately.  Patient is not having the trickling down her leg after urinating.  Patient right hip pain was gone by the end of her treatment. She is doing better with her urinary leakage. She is able to  go from sit to stand easier but needs verbal cues to sit smoothly. Patient will benefit from skilled therapy to improve balance, strength and pelvic floor coordination to reduce leakage and walk safely  to the bathroom.    OBJECTIVE IMPAIRMENTS: decreased activity tolerance, decreased coordination, decreased endurance, decreased mobility, difficulty walking, decreased strength, and increased fascial restrictions.    ACTIVITY LIMITATIONS: standing, transfers, continence, toileting, and locomotion level   PARTICIPATION LIMITATIONS: shopping and community activity   PERSONAL FACTORS: Age, Fitness, Past/current experiences, and 1-2 comorbidities: Left breast cancer; CHF; Hypothyroidism; Pacemaker; Lumbar fusion; Left knee arthroplasty  are also affecting patient's functional outcome.    REHAB POTENTIAL: Good   CLINICAL DECISION MAKING: Evolving/moderate complexity   EVALUATION COMPLEXITY: Moderate     GOALS: Goals reviewed with patient? Yes   SHORT TERM GOALS: Target date: 03/23/2022   Patient independent with initial HEP for pelvic floor contraction.  Baseline: Goal status: Goal met 03/05/22   2.  Patient is able to feel the urinary leakage due to improve pelvic floor tone.  Baseline:  Goal status: Met 03/19/2022   3.  Patient educated on the urge to void to be able to walk to the bathroom slowl Baseline:  Goal status: Met 04/11/22   4.  Timed up and go </= 14 sec Baseline:  Goal status: Met 03/09/2022   5.  Sit to stand </- 18 sec  Baseline:  Goal status: Met 03/19/2020     LONG TERM GOALS: Target date: 05/18/2021   Patient independent with advanced hep for pelvic floor strength and balance exercises.  Baseline:  Goal status: ongoing 03/28/22   2.  Patient reports her urinary leakage is </= 50% due to improved pelvic floor strength >/ 3/5 Baseline: Patient reports her pads are 30% dryer.  Goal status: ongoing 03/28/22   3.  Time up and go test is </= 13 sec. So she is  able to walk to the commode safely when she has the urge to urinate.  Baseline:  Goal status: INITIAL   4.  Sit to stand score is </= 15 sec so she is able to safely get out of the chair or get up from the commode.  Baseline:  Goal status: INITIAL   5.  Patient is able to wake up 2 times at night instead of every hour due to increased in pelvic floor strength and understanding on how to use the urge to void behavioral technique.  Baseline: when not drinking much during the day wake up every 2 hours and increase her drinking it is every hour Goal status: ongoing 03/28/22     PLAN:   PT FREQUENCY: 1-2x/week   PT DURATION: 12 weeks   PLANNED INTERVENTIONS: Therapeutic exercises, Therapeutic activity, Neuromuscular re-education, Balance training, Gait training, Patient/Family education, Self Care, Biofeedback, and Manual therapy   PLAN FOR NEXT SESSION: Continue with hip strength:test timed up and go ;  All exercises should include pelvic floor contraction   Earlie Counts, PT 04/16/22 11:43 AM

## 2022-04-18 ENCOUNTER — Ambulatory Visit: Payer: Medicare Other

## 2022-04-18 DIAGNOSIS — R252 Cramp and spasm: Secondary | ICD-10-CM

## 2022-04-18 DIAGNOSIS — M6281 Muscle weakness (generalized): Secondary | ICD-10-CM

## 2022-04-18 NOTE — Therapy (Signed)
OUTPATIENT PHYSICAL THERAPY TREATMENT NOTE   Patient Name: [name] MRN: [id number] DOB:[birth year], 89 y.o., female Today's Date: 04/18/2022  PCP: [name], NP   REFERRING PROVIDER: [name], MD   END OF SESSION:   PT End of Session - 04/18/22 1107     Visit Number 14    Date for PT Re-Evaluation 05/18/22    Authorization Type U[employer]    Authorization - Visit Number 73    Authorization - Number of Visits 20    Progress Note Due on Visit 20    PT Start Time 1102    PT Stop Time 1141    PT Time Calculation (min) 39 min    Activity Tolerance Patient tolerated treatment well    Behavior During Therapy WFL for tasks assessed/performed             Past Medical History:  Diagnosis Date   Arthritis    Cancer ([place])    left breast cancer    Cataracts, bilateral    removed by surgery   CHF (congestive heart failure) ([address])    PACEMAKER & DEFIB   Complication of anesthesia    hypotensive after back surgery in 2006   Depression    Dyslipidemia    Fainted 04/21/06   AT [facility]   GERD (gastroesophageal reflux disease)    Headache(784.0)    Hearing loss    bilateral hearing aids   HLD (hyperlipidemia)    diet controlled    Hypertension    Hypothyroidism    ICD (implantable cardiac defibrillator) in place    pt has pacer/icd   ICD (implantable cardiac defibrillator), biventricular, in situ    LBBB (left bundle branch block)    Memory loss    Nonischemic cardiomyopathy ([place])    Normal coronary arteries    s/p cardiac cath 2007   Pacemaker    ICD [employer]   Syncope    Systolic CHF [place])    Vertigo    Wears glasses    Past Surgical History:  Procedure Laterality Date   BACK SURGERY     lumbar fusion    BREAST LUMPECTOMY Left 2008   BREAST SURGERY  2000   LUMP REMOVAL. STAGE 1 CANCER   CARDIAC CATHETERIZATION     CATARACT EXTRACTION     COLONOSCOPY     EYE SURGERY     IMPLANTABLE CARDIOVERTER DEFIBRILLATOR GENERATOR CHANGE N/A  12/18/2012   Procedure: IMPLANTABLE CARDIOVERTER DEFIBRILLATOR GENERATOR CHANGE;  Surgeon: [name], MD;  Location: [facility] CATH LAB;  Service: Cardiovascular;  Laterality: N/A;   JOINT REPLACEMENT  06/14/01   right   LUMBAR FUSION  2006   MASS EXCISION  11/08/2011   Procedure: EXCISION MASS;  Surgeon: [name], MD;  Location: [facility];  Service: General;  Laterality: Left;  Excision Left Thigh Mass   MASTECTOMY W/ SENTINEL NODE BIOPSY Left 06/04/2019   Procedure: LEFT MASTECTOMY WITH SENTINEL LYMPH NODE BIOPSY;  Surgeon: [name], MD;  Location: [place];  Service: General;  Laterality: Left;   MASTECTOMY, PARTIAL  2008   GOT PACEMAKER AND DEFIB AT THAT TIME   PACEMAKER INSERTION  04/23/06   TOTAL KNEE ARTHROPLASTY  05/17/01   RIGHT KNEE   TOTAL KNEE ARTHROPLASTY Left 11/29/2014   Procedure: TOTAL LEFT KNEE ARTHROPLASTY;  Surgeon: Paralee Cancel, MD;  Location: [facility];  Service: Orthopedics;  Laterality: Left;   Patient Active Problem List   Diagnosis Date Noted   Sciatica associated with disorder  of multiple sites of spine 03/20/2021   Bipolar II disorder ([place]) 03/20/2021   Chronic pain syndrome 03/20/2021   Dementia without behavioral disturbance ([place]) 02/04/2020   Urinary retention 02/04/2020   Seasonal allergies 02/04/2020   Recurrent major depressive disorder, in partial remission ([place]) 07/17/2019   Status post left mastectomy 07/01/2019   Breast cancer of lower-outer quadrant of left female breast ([place]) 06/04/2019   Candida infection, oral 01/15/2019   Senile purpura ([place]) 04/14/2018   Lumbar post-laminectomy syndrome 12/03/2017   Lumbar spondylosis 12/03/2017   History of back surgery 09/01/2017   Constipation 09/01/2017   Hypothyroidism due to acquired atrophy of thyroid 09/01/2017   Mixed hyperlipidemia 09/01/2017   Age-related osteoporosis without current pathological fracture 09/01/2017   High risk medication use 09/01/2017   Arthritis of hand 05/17/2017   Atherosclerosis  of native arteries of extremity with intermittent claudication ([place]) 05/15/2017   Status post total bilateral knee replacement 12/20/2016   Gait abnormality 07/16/2016   Chronic low back pain 07/16/2016   Mild cognitive impairment 07/16/2016   Wrist pain 05/10/2015   S/P left TKA 11/29/2014   S/P knee replacement 11/29/2014   Spontaneous bruising 08/27/2014   Numbness and tingling in right hand 07/28/2014   Essential tremor 07/28/2014   Dizziness 05/28/2014   Shaky 05/28/2014   Memory loss 05/28/2014   Depression 05/06/2014   Lipoma of left upper thigh 3x5 cm 09/28/2011   Cerebral vascular accident ([place]) 08/09/2011   Syncope 08/09/2011   History of breast cancer [id number], s/p BCT 2008, triple negative 01/26/2011   Chronic L breast pain with chronic recurrent seroma, s/p excisional biopsy 01/12/2010 01/26/2011   Fainted    ICD (implantable cardioverter-defibrillator), biventricular, in situ 13/24/4010   Chronic systolic heart failure ([place]) 08/02/2010   Essential hypertension 08/02/2010   REFERRING DIAG: N39.45 (ICD-10-CM) - Continuous leakage   THERAPY DIAG:  Muscle weakness (generalized)   Cramp and spasm   Other abnormalities of gait and mobility   Other lack of coordination   Rationale for Evaluation and Treatment: Rehabilitation   ONSET DATE: 6 months ago   SUBJECTIVE:                                                                                                                                                                                            SUBJECTIVE STATEMENT:Patient states that the pain in right hip at level 5/10.  I am down to 2 pads per day at most.       PAIN: right hip pain level 6/10. Started 2 days ago suddenly. Pain happens with movement.       PRECAUTIONS:  ICD/Pacemaker, left breast cancer   WEIGHT BEARING RESTRICTIONS: No   FALLS:  Has patient fallen in last 6 months? No   LIVING ENVIRONMENT: Lives with: lives with their family      OCCUPATION: retired   PLOF: Independent   PATIENT GOALS: reduce leakage   PERTINENT HISTORY:  Left breast cancer; CHF; Hypothyroidism; Pacemaker; Lumbar fusion; Left knee arthroplasty     URINATION: Pain with urination: No Fully empty bladder: Yes:  Stream: Strong Urgency: Yes:   Frequency: day time voids 4 night times every 2 hours or 1 hour Leakage:  does not feel the leakage Pads: Yes: 2 pads per day, night wears a depends with toilet paper, 3 times at night   PREGNANCY: Vaginal deliveries 1     PROLAPSE: Cystocele   and Urethrocele      OBJECTIVE: (objective measures completed at initial evaluation unless otherwise dated)   DIAGNOSTIC FINDINGS:  Pessary fittings     COGNITION: Overall cognitive status: Within functional limits for tasks assessed                          SENSATION: Light touch: Appears intact Proprioception: Appears intact     FUNCTIONAL TESTS:  5 times sit to stand: 20 sec; 03/19/2022 13 sec but felt urinary leakage while doing the task.  Timed up and go (TUG): 15 sec 03/09/2022: 16 sec, 13 sec, 11 sec  04/18/22: TUG: 14.78 sec 5 times sit to stand: 15.00 sec   GAIT: Assistive device utilized: Single point cane, when she is going out Level of assistance: Complete Independence Comments: small steps and slowly    POSTURE: No Significant postural limitations   PELVIC ALIGNMENT:     LOWER EXTREMITY ROM:   Passive ROM Right eval Left eval  Hip external rotation 25 45   (Blank rows = not tested)   LOWER EXTREMITY MMT:   MMT Right eval Left eval Right 1/3/224 Left 04/11/22  Hip extension 3/5 3/5 4-/5 4-/5  Hip abduction 3/5 3/5 4-/5 4-/5  Hip adduction 3/5 3/5 5/5 5/5    PALPATION:    External Perineal Exam some redness in the vulva area                             Internal Pelvic Floor tightness in the introitus. She would contract her gluteal more than the vaginal but after tactile cues was able to isolate the vaginal  canal.    Patient confirms identification and approves PT to assess internal pelvic floor and treatment Yes   PELVIC MMT:   MMT eval 03/09/2022 03/28/22  Vaginal 1/5 2/5 3/5 with weak hug        TONE: Low tone   PROLAPSE: Had pessary in   TODAY'S TREATMENT:   04/18/2022 Exercises: Nustep x 5 min level 5 (PT present to discuss status) Strengthening: Seated hip adduction with pink ball 2 x 10 Seated clam 2 x 10 Sitting press ball into legs holding for 5 sec 2 sets of 10 with pelvic floor contraction Mini sit ups with red plyo ball 2 x 10 Seated modified russian twist x 10 each side with red plyo ball with pelvic floor contraction Sit to stand 2 x 10 holding red plyo ball Seated hamstring curl with red band 2 x 10 each LE with ball squeeze and pelvic floor contraction TUG and 5 times sit to stand completed: see above  04/16/2022  Exercises: Stretches/mobility: Strengthening: Nustep x 6 min level 5 while assessing patient Sitting press ball into legs holding for 5 sec 15 with pelvic floor contraction Sitting lean back holding red plyoball and pelvic floor contraction Seated modified russian twist x 20 with red plyo ball with pelvic floor contraction Sit to stand holding red plyoball with pelvic floor contraction and working on control to sit.  Seated hamstring curl with red band 2 x 10 each LE with ball squeeze and pelvic floor contraction    04/11/2022 Exercises: Strengthening: Nustep x 7 min level 4 while therapist assesses patient Discussed with patient to go to the bathroom every 2 hours to improved the endurance of the pelvic floor muscles.  Supine pelvic floor contraction 5 sec x 1, 10 sec x 5; 5 quick contractions Supine pelvic floor contraction with elevator 5 times going up and down 3 levels.  Supine hip flexion with pelvic floor contraction 10x each leg Sitting press hands into the thighs and contract the pelvic floor holding 5 sec 10x      04/04/22: Nustep x 6 min  level 2 Seated ball squeeze x 20 Seated ball push down on knees to focus on pelvic floor contraction 2 x 10 Sit to stand 2 x 10 Seated clam with blue loop 2 x 10 Seated hamstring curl with red band 2 x 10 each LE Seated LAQ with 2 lbs 2 x 10 each Seated march x 20 with 2 lbs Seated hip ER with 2 lbs x 10 each LE Seated mini sit ups holding red plyo ball 2 x 10 Seated modified russian twist x 20 with red plyo ball Seated shoulder to hip x 10 each side   03/28/22 After nustep BP was 154/77 and pulse is 68 Exercises: Strengthening:  Nustep: New model: L5 x 5 min with concurrent discuss of pain & status Bridge with ball and pelvic floor contraction 10x Horizontal abduction with yellow band and ball squeeze to contract the pelvic floor 10x Supine bilateral shoulder flexion holding yellow band with ball squeeze and pelvic floor contraction Supine pelvic floor contraction with tactile cues holding 5 sec 10x Sitting pelvic floor contraction holding 5 sec 10x        PATIENT EDUCATION:  03/28/2022 Education details: Access Code: [email or web address] Person educated: Patient Education method: Consulting civil engineer, Demonstration, Corporate treasurer cues, Verbal cues, and Handouts Education comprehension: verbalized understanding, returned demonstration, verbal cues required, tactile cues required, and needs further education   HOME EXERCISE PROGRAM: 03/28/22  Access Code: [email or web address] URL: [email or web address] Date: 03/28/2022 Prepared by: Earlie Counts   Exercises - Supine Pelvic Floor Contraction  - 3 x daily - 7 x weekly - 1 sets - 10 reps - 5 sec hold - Supine Piriformis Stretch with Foot on Ground  - 1 x daily - 7 x weekly - 2 reps - 20 hold - Supine Bridge with Pelvic Floor Contraction  - 1 x daily - 7 x weekly - 3 sets - 10 reps - Seated Long Arc Quad  - 1 x daily - 7 x weekly - 2 sets - 10 reps - Seated March  - 1 x daily - 7 x weekly - 2 sets - 10 reps - Seated Abdominal Press into [employer]  -  1 x daily - 7 x weekly - 1 sets - 10 reps - Supine Shoulder Horizontal Abduction with Resistance  - 1 x daily - 3 x weekly - 1 sets - 10 reps - Supine Shoulder Flexion Extension AAROM with Dowel  -  1 x daily - 3 x weekly - 1 sets - 10 reps - Seated Pelvic Floor Contraction  - 3 x daily - 7 x weekly - 1 sets - 10 reps - 5 sec hold     ASSESSMENT:   CLINICAL IMPRESSION: Patient is progressing appropriately.  Functional scores improved slightly.  She is still having the right hip pain but she states this is slightly better.  However, after NuStep, she had to stand for several seconds and was grabbing her right hip on start up.  We will continue to monitor this and add in some stretching for this.   Patient will benefit from skilled therapy to improve balance, strength and pelvic floor coordination to reduce leakage and walk safely to the bathroom.    OBJECTIVE IMPAIRMENTS: decreased activity tolerance, decreased coordination, decreased endurance, decreased mobility, difficulty walking, decreased strength, and increased fascial restrictions.    ACTIVITY LIMITATIONS: standing, transfers, continence, toileting, and locomotion level   PARTICIPATION LIMITATIONS: shopping and community activity   PERSONAL FACTORS: Age, Fitness, Past/current experiences, and 1-2 comorbidities: Left breast cancer; CHF; Hypothyroidism; Pacemaker; Lumbar fusion; Left knee arthroplasty  are also affecting patient's functional outcome.    REHAB POTENTIAL: Good   CLINICAL DECISION MAKING: Evolving/moderate complexity   EVALUATION COMPLEXITY: Moderate     GOALS: Goals reviewed with patient? Yes   SHORT TERM GOALS: Target date: 03/23/2022   Patient independent with initial HEP for pelvic floor contraction.  Baseline: Goal status: Goal met 03/05/22   2.  Patient is able to feel the urinary leakage due to improve pelvic floor tone.  Baseline:  Goal status: Met 03/19/2022   3.  Patient educated on the urge to void  to be able to walk to the bathroom slowl Baseline:  Goal status: Met 04/11/22   4.  Timed up and go </= 14 sec Baseline:  Goal status: Met 03/09/2022   5.  Sit to stand </- 18 sec  Baseline:  Goal status: Met 03/19/2020     LONG TERM GOALS: Target date: 05/18/2021   Patient independent with advanced hep for pelvic floor strength and balance exercises.  Baseline:  Goal status: ongoing 03/28/22   2.  Patient reports her urinary leakage is </= 50% due to improved pelvic floor strength >/ 3/5 Baseline: Patient reports her pads are 30% dryer.  Goal status: ongoing 03/28/22   3.  Time up and go test is </= 13 sec. So she is able to walk to the commode safely when she has the urge to urinate.  Baseline:  Goal status: INITIAL   4.  Sit to stand score is </= 15 sec so she is able to safely get out of the chair or get up from the commode.  Baseline:  Goal status: INITIAL   5.  Patient is able to wake up 2 times at night instead of every hour due to increased in pelvic floor strength and understanding on how to use the urge to void behavioral technique.  Baseline: when not drinking much during the day wake up every 2 hours and increase her drinking it is every hour Goal status: ongoing 03/28/22     PLAN:   PT FREQUENCY: 1-2x/week   PT DURATION: 12 weeks   PLANNED INTERVENTIONS: Therapeutic exercises, Therapeutic activity, Neuromuscular re-education, Balance training, Gait training, Patient/Family education, Self Care, Biofeedback, and Manual therapy   PLAN FOR NEXT SESSION: Continue with hip strength. Add in right hip stretching.   All exercises should include pelvic floor  contraction   Anderson Malta B. Rick Carruthers, PT 04/18/22 11:45 AM

## 2022-04-23 ENCOUNTER — Ambulatory Visit: Payer: Medicare Other | Admitting: Physical Therapy

## 2022-04-23 ENCOUNTER — Encounter: Payer: Self-pay | Admitting: Physical Therapy

## 2022-04-23 DIAGNOSIS — R252 Cramp and spasm: Secondary | ICD-10-CM

## 2022-04-23 DIAGNOSIS — M6281 Muscle weakness (generalized): Secondary | ICD-10-CM

## 2022-04-23 DIAGNOSIS — R2689 Other abnormalities of gait and mobility: Secondary | ICD-10-CM

## 2022-04-23 NOTE — Therapy (Signed)
OUTPATIENT PHYSICAL THERAPY TREATMENT NOTE   Patient Name: [name] MRN: [id number] DOB:17-Feb-1934, 89 y.o., female Today's Date: 04/23/2022  PCP: [name], NP   REFERRING PROVIDER: [name], MD   END OF SESSION:   PT End of Session - 04/23/22 0931     Visit Number 15    Date for PT Re-Evaluation 05/18/22    Authorization Type U[employer]    Authorization - Visit Number 15    Authorization - Number of Visits 20    PT Start Time 0930    PT Stop Time 1010    PT Time Calculation (min) 40 min    Activity Tolerance Patient tolerated treatment well    Behavior During Therapy WFL for tasks assessed/performed             Past Medical History:  Diagnosis Date   Arthritis    Cancer ([place])    left breast cancer    Cataracts, bilateral    removed by surgery   CHF (congestive heart failure) ([place])    PACEMAKER & DEFIB   Complication of anesthesia    hypotensive after back surgery in 2006   Depression    Dyslipidemia    Fainted 04/21/06   AT [facility]   GERD (gastroesophageal reflux disease)    Headache(784.0)    Hearing loss    bilateral hearing aids   HLD (hyperlipidemia)    diet controlled    Hypertension    Hypothyroidism    ICD (implantable cardiac defibrillator) in place    pt has pacer/icd   ICD (implantable cardiac defibrillator), biventricular, in situ    LBBB (left bundle branch block)    Memory loss    Nonischemic cardiomyopathy ([place])    Normal coronary arteries    s/p cardiac cath 2007   Pacemaker    ICD [employer]   Syncope    Systolic CHF [facility])    Vertigo    Wears glasses    Past Surgical History:  Procedure Laterality Date   BACK SURGERY     lumbar fusion    BREAST LUMPECTOMY Left 2008   BREAST SURGERY  2000   LUMP REMOVAL. STAGE 1 CANCER   CARDIAC CATHETERIZATION     CATARACT EXTRACTION     COLONOSCOPY     EYE SURGERY     IMPLANTABLE CARDIOVERTER DEFIBRILLATOR GENERATOR CHANGE N/A 12/18/2012   Procedure: IMPLANTABLE  CARDIOVERTER DEFIBRILLATOR GENERATOR CHANGE;  Surgeon: [name], MD;  [facility] C[facility] LAB;  Service: Cardiovascular;  Laterality: N/A;   JOINT REPLACEMENT  06/14/01   right   LUMBAR FUSION  2006   MASS EXCISION  11/08/2011   Procedure: EXCISION MASS;  Surgeon: [name], MD;  Location: [facility];  Service: General;  Laterality: Left;  Excision Left Thigh Mass   MASTECTOMY W/ SENTINEL NODE BIOPSY Left 06/04/2019   Procedure: LEFT MASTECTOMY WITH SENTINEL LYMPH NODE BIOPSY;  Surgeon: [name], MD;  Location: [place];  Service: General;  Laterality: Left;   MASTECTOMY, PARTIAL  2008   GOT PACEMAKER AND DEFIB AT THAT TIME   PACEMAKER INSERTION  04/23/06   TOTAL KNEE ARTHROPLASTY  05/17/01   RIGHT KNEE   TOTAL KNEE ARTHROPLASTY Left 11/29/2014   Procedure: TOTAL LEFT KNEE ARTHROPLASTY;  Surgeon: Paralee Cancel, MD;  Location: [facility];  Service: Orthopedics;  Laterality: Left;   Patient Active Problem List   Diagnosis Date Noted   Sciatica associated with disorder of multiple sites of spine 03/20/2021   Bipolar  II disorder ([place]) 03/20/2021   Chronic pain syndrome 03/20/2021   Dementia without behavioral disturbance ([place]) 02/04/2020   Urinary retention 02/04/2020   Seasonal allergies 02/04/2020   Recurrent major depressive disorder, in partial remission ([place]) 07/17/2019   Status post left mastectomy 07/01/2019   Breast cancer of lower-outer quadrant of left female breast ([place]) 06/04/2019   Candida infection, oral 01/15/2019   Senile purpura ([place]) 04/14/2018   Lumbar post-laminectomy syndrome 12/03/2017   Lumbar spondylosis 12/03/2017   History of back surgery 09/01/2017   Constipation 09/01/2017   Hypothyroidism due to acquired atrophy of thyroid 09/01/2017   Mixed hyperlipidemia 09/01/2017   Age-related osteoporosis without current pathological fracture 09/01/2017   High risk medication use 09/01/2017   Arthritis of hand 05/17/2017   Atherosclerosis of native arteries of extremity with  intermittent claudication ([place]) 05/15/2017   Status post total bilateral knee replacement 12/20/2016   Gait abnormality 07/16/2016   Chronic low back pain 07/16/2016   Mild cognitive impairment 07/16/2016   Wrist pain 05/10/2015   S/P left TKA 11/29/2014   S/P knee replacement 11/29/2014   Spontaneous bruising 08/27/2014   Numbness and tingling in right hand 07/28/2014   Essential tremor 07/28/2014   Dizziness 05/28/2014   Shaky 05/28/2014   Memory loss 05/28/2014   Depression 05/06/2014   Lipoma of left upper thigh 3x5 cm 09/28/2011   Cerebral vascular accident ([place]) 08/09/2011   Syncope 08/09/2011   History of breast cancer [id number], s/p BCT 2008, triple negative 01/26/2011   Chronic L breast pain with chronic recurrent seroma, s/p excisional biopsy 01/12/2010 01/26/2011   Fainted    ICD (implantable cardioverter-defibrillator), biventricular, in situ 50/53/9767   Chronic systolic heart failure ([place]) 08/02/2010   Essential hypertension 08/02/2010   REFERRING DIAG: N39.45 (ICD-10-CM) - Continuous leakage   THERAPY DIAG:  Muscle weakness (generalized)   Cramp and spasm   Other abnormalities of gait and mobility   Other lack of coordination   Rationale for Evaluation and Treatment: Rehabilitation   ONSET DATE: 6 months ago   SUBJECTIVE:                                                                                                                                                                                            SUBJECTIVE STATEMENT: Daughter reports her mother is not drinking much water.      PAIN: right hip pain level 5/10. Pain happens with movement.       PRECAUTIONS: ICD/Pacemaker, left breast cancer   WEIGHT BEARING RESTRICTIONS: No   FALLS:  Has patient fallen in last 6 months? No   LIVING ENVIRONMENT: Lives  with: lives with their family     OCCUPATION: retired   PLOF: Independent   PATIENT GOALS: reduce leakage   PERTINENT HISTORY:  Left  breast cancer; CHF; Hypothyroidism; Pacemaker; Lumbar fusion; Left knee arthroplasty     URINATION: Pain with urination: No Fully empty bladder: Yes:  Stream: Strong Urgency: Yes:   Frequency: day time voids 4 night times every 2 hours or 1 hour Leakage:  does not feel the leakage Pads: Yes: 2 pads per day, night wears a depends with toilet paper, 3 times at night   PREGNANCY: Vaginal deliveries 1     PROLAPSE: Cystocele   and Urethrocele      OBJECTIVE: (objective measures completed at initial evaluation unless otherwise dated)   DIAGNOSTIC FINDINGS:  Pessary fittings     COGNITION: Overall cognitive status: Within functional limits for tasks assessed                          SENSATION: Light touch: Appears intact Proprioception: Appears intact     FUNCTIONAL TESTS:  5 times sit to stand: 20 sec; 03/19/2022 13 sec but felt urinary leakage while doing the task.  Timed up and go (TUG): 15 sec 03/09/2022: 16 sec, 13 sec, 11 sec   04/18/22: TUG: 14.78 sec 5 times sit to stand: 15.00 sec   GAIT: Assistive device utilized: Single point cane, when she is going out Level of assistance: Complete Independence Comments: small steps and slowly    POSTURE: No Significant postural limitations   PELVIC ALIGNMENT:     LOWER EXTREMITY ROM:   Passive ROM Right eval Left eval  Hip external rotation 25 45   (Blank rows = not tested)   LOWER EXTREMITY MMT:   MMT Right eval Left eval Right 1/3/224 Left 04/11/22  Hip extension 3/5 3/5 4-/5 4-/5  Hip abduction 3/5 3/5 4-/5 4-/5  Hip adduction 3/5 3/5 5/5 5/5    PALPATION:    External Perineal Exam some redness in the vulva area                             Internal Pelvic Floor tightness in the introitus. She would contract her gluteal more than the vaginal but after tactile cues was able to isolate the vaginal canal.    Patient confirms identification and approves PT to assess internal pelvic floor and treatment Yes    PELVIC MMT:   MMT eval 03/09/2022 03/28/22 04/23/22  Vaginal 1/5 2/5 3/5 with weak hug 3/5        TONE: Low tone   PROLAPSE: Had pessary in   TODAY'S TREATMENT:   04/23/22 Manual: Internal pelvic floor techniques: No emotional/communication barriers or cognitive limitation. Patient is motivated to learn. Patient understands and agrees with treatment goals and plan. PT explains patient will be examined in standing, sitting, and lying down to see how their muscles and joints work. When they are ready, they will be asked to remove their underwear so PT can examine their perineum. The patient is also given the option of providing their own chaperone as one is not provided in our facility. The patient also has the right and is explained the right to defer or refuse any part of the evaluation or treatment including the internal exam. With the patient's consent, PT will use one gloved finger to gently assess the muscles of the pelvic floor, seeing how well it contracts and  relaxes and if there is muscle symmetry. After, the patient will get dressed and PT and patient will discuss exam findings and plan of care. PT and patient discuss plan of care, schedule, attendance policy and HEP activities.  Placed index finger into the vaginal canal to assess strength Neuromuscular re-education: Pelvic floor contraction training: Pelvic floor EMG with surface electrodes on the perineal area working in supine and standing pelvic floor contractions quickly and holding 5 sec.  Pelvic floor contraction with marching and mini squats.  Down training: Educated patient on how to deter the urge to urinate during the night so she is not going to the bathroom every hour.  Exercises: Strengthening: Nustep x 7 min level 5 (PT present to discuss status)    04/18/2022 Exercises: Nustep x 5 min level 5 (PT present to discuss status) Strengthening: Seated hip adduction with pink ball 2 x 10 Seated clam 2 x 10 Sitting  press ball into legs holding for 5 sec 2 sets of 10 with pelvic floor contraction Mini sit ups with red plyo ball 2 x 10 Seated modified russian twist x 10 each side with red plyo ball with pelvic floor contraction Sit to stand 2 x 10 holding red plyo ball Seated hamstring curl with red band 2 x 10 each LE with ball squeeze and pelvic floor contraction TUG and 5 times sit to stand completed: see above   04/16/2022 Exercises: Strengthening: Nustep x 6 min level 5 while assessing patient Sitting press ball into legs holding for 5 sec 15 with pelvic floor contraction Sitting lean back holding red plyoball and pelvic floor contraction Seated modified russian twist x 20 with red plyo ball with pelvic floor contraction Sit to stand holding red plyoball with pelvic floor contraction and working on control to sit.  Seated hamstring curl with red band 2 x 10 each LE with ball squeeze and pelvic floor contraction    04/11/2022 Exercises: Strengthening: Nustep x 7 min level 4 while therapist assesses patient Discussed with patient to go to the bathroom every 2 hours to improved the endurance of the pelvic floor muscles.  Supine pelvic floor contraction 5 sec x 1, 10 sec x 5; 5 quick contractions Supine pelvic floor contraction with elevator 5 times going up and down 3 levels.  Supine hip flexion with pelvic floor contraction 10x each leg Sitting press hands into the thighs and contract the pelvic floor holding 5 sec 10x             PATIENT EDUCATION:  03/28/2022 Education details: Access Code: [email or web address] Person educated: Patient Education method: Explanation, Demonstration, Tactile cues, Verbal cues, and Handouts Education comprehension: verbalized understanding, returned demonstration, verbal cues required, tactile cues required, and needs further education   HOME EXERCISE PROGRAM: 03/28/22  Access Code: [email or web address] URL: [email or web address] Date: 03/28/2022 Prepared by: Earlie Counts   Exercises - Supine Pelvic Floor Contraction  - 3 x daily - 7 x weekly - 1 sets - 10 reps - 5 sec hold - Supine Piriformis Stretch with Foot on Ground  - 1 x daily - 7 x weekly - 2 reps - 20 hold - Supine Bridge with Pelvic Floor Contraction  - 1 x daily - 7 x weekly - 3 sets - 10 reps - Seated Long Arc Quad  - 1 x daily - 7 x weekly - 2 sets - 10 reps - Seated March  - 1 x daily - 7 x weekly - 2 sets - 10 reps - Seated  Abdominal Press into [employer]  - 1 x daily - 7 x weekly - 1 sets - 10 reps - Supine Shoulder Horizontal Abduction with Resistance  - 1 x daily - 3 x weekly - 1 sets - 10 reps - Supine Shoulder Flexion Extension AAROM with Dowel  - 1 x daily - 3 x weekly - 1 sets - 10 reps - Seated Pelvic Floor Contraction  - 3 x daily - 7 x weekly - 1 sets - 10 reps - 5 sec hold     ASSESSMENT:   CLINICAL IMPRESSION: Pelvic floor strength is 3/5. She is seeing a bulge in the vaginal area.  she is able to quickly contract her pelvic floor and hold for 5 seconds. She is waking up every hour at night to urinate and sometimes her pad is wet. Patient is not drinking a lot of water and explained to patient that this could set her up for a UTI. Patient will benefit from skilled therapy to improve balance, strength and pelvic floor coordination to reduce leakage and walk safely to the bathroom.    OBJECTIVE IMPAIRMENTS: decreased activity tolerance, decreased coordination, decreased endurance, decreased mobility, difficulty walking, decreased strength, and increased fascial restrictions.    ACTIVITY LIMITATIONS: standing, transfers, continence, toileting, and locomotion level   PARTICIPATION LIMITATIONS: shopping and community activity   PERSONAL FACTORS: Age, Fitness, Past/current experiences, and 1-2 comorbidities: Left breast cancer; CHF; Hypothyroidism; Pacemaker; Lumbar fusion; Left knee arthroplasty  are also affecting patient's functional outcome.    REHAB POTENTIAL: Good    CLINICAL DECISION MAKING: Evolving/moderate complexity   EVALUATION COMPLEXITY: Moderate     GOALS: Goals reviewed with patient? Yes   SHORT TERM GOALS: Target date: 03/23/2022   Patient independent with initial HEP for pelvic floor contraction.  Baseline: Goal status: Goal met 03/05/22   2.  Patient is able to feel the urinary leakage due to improve pelvic floor tone.  Baseline:  Goal status: Met 03/19/2022   3.  Patient educated on the urge to void to be able to walk to the bathroom slowl Baseline:  Goal status: Met 04/11/22   4.  Timed up and go </= 14 sec Baseline:  Goal status: Met 03/09/2022   5.  Sit to stand </- 18 sec  Baseline:  Goal status: Met 03/19/2020     LONG TERM GOALS: Target date: 05/18/2021   Patient independent with advanced hep for pelvic floor strength and balance exercises.  Baseline:  Goal status: ongoing 03/28/22   2.  Patient reports her urinary leakage is </= 50% due to improved pelvic floor strength >/ 3/5 Baseline: Patient reports her pads are 30% dryer.  Goal status: ongoing 03/28/22   3.  Time up and go test is </= 13 sec. So she is able to walk to the commode safely when she has the urge to urinate.  Baseline:  Goal status: INITIAL   4.  Sit to stand score is </= 15 sec so she is able to safely get out of the chair or get up from the commode.  Baseline:  Goal status: Met 04/23/22   5.  Patient is able to wake up 2 times at night instead of every hour due to increased in pelvic floor strength and understanding on how to use the urge to void behavioral technique.  Baseline: when not drinking much during the day wake up every 2 hours and increase her drinking it is every hour Goal status: ongoing 03/28/22  PLAN:   PT FREQUENCY: 1-2x/week   PT DURATION: 12 weeks   PLANNED INTERVENTIONS: Therapeutic exercises, Therapeutic activity, Neuromuscular re-education, Balance training, Gait training, Patient/Family education, Self Care,  Biofeedback, and Manual therapy   PLAN FOR NEXT SESSION: Continue with hip strength. Add in right hip stretching.   All exercises should include pelvic floor contraction    Earlie Counts, PT 04/23/22 10:16 AM

## 2022-04-23 NOTE — Patient Instructions (Signed)
Urge Incontinence  Ideal urination frequency is every 2-4 wakeful hours, which equates to 5-8 times within a 24-hour period.   Urge incontinence is leakage that occurs when the bladder muscle contracts, creating a sudden need to go before getting to the bathroom.   Going too often when your bladder isn't actually full can disrupt the body's automatic signals to store and hold urine longer, which will increase urgency/frequency.  In this case, the bladder "is running the show" and strategies can be learned to retrain this pattern.   One should be able to control the first urge to urinate, at around 150mL.  The bladder can hold up to a "grande latte," or 400mL. To help you gain control, practice the Urge Drill below when urgency strikes.  This drill will help retrain your bladder signals and allow you to store and hold urine longer.  The overall goal is to stretch out your time between voids to reach a more manageable voiding schedule.    Practice your "quick flicks" often throughout the day (each waking hour) even when you don't need feel the urge to go.  This will help strengthen your pelvic floor muscles, making them more effective in controlling leakage.  Urge Drill  When you feel an urge to go, follow these steps to regain control: Stop what you are doing and be still Take one deep breath, directing your air into your abdomen Think an affirming thought, such as "I've got this." Do 5 quick flicks of your pelvic floor Walk with control to the bathroom to void, or delay voiding  [facility] [address], [address] [place], [place] [address] Phone # [phone] Fax [phone]  

## 2022-04-25 ENCOUNTER — Ambulatory Visit: Payer: Medicare Other

## 2022-04-25 DIAGNOSIS — R252 Cramp and spasm: Secondary | ICD-10-CM

## 2022-04-25 DIAGNOSIS — M6281 Muscle weakness (generalized): Secondary | ICD-10-CM | POA: Diagnosis not present

## 2022-04-25 NOTE — Therapy (Signed)
OUTPATIENT PHYSICAL THERAPY TREATMENT NOTE   Patient Name: [name] MRN: [id number] DOB:1933-12-08, 89 y.o., female Today's Date: 04/25/2022  PCP: [name], NP   REFERRING PROVIDER: [name], MD   END OF SESSION:   PT End of Session - 04/25/22 1059     Visit Number 16    Date for PT Re-Evaluation 05/18/22    Authorization Type U[employer]    Progress Note Due on Visit 27    PT Start Time 1100    PT Stop Time 1145    PT Time Calculation (min) 45 min    Activity Tolerance Patient tolerated treatment well    Behavior During Therapy WFL for tasks assessed/performed             Past Medical History:  Diagnosis Date   Arthritis    Cancer ([place])    left breast cancer    Cataracts, bilateral    removed by surgery   CHF (congestive heart failure) ([place])    PACEMAKER & DEFIB   Complication of anesthesia    hypotensive after back surgery in 2006   Depression    Dyslipidemia    Fainted 04/21/06   AT [facility]   GERD (gastroesophageal reflux disease)    Headache(784.0)    Hearing loss    bilateral hearing aids   HLD (hyperlipidemia)    diet controlled    Hypertension    Hypothyroidism    ICD (implantable cardiac defibrillator) in place    pt has pacer/icd   ICD (implantable cardiac defibrillator), biventricular, in situ    LBBB (left bundle branch block)    Memory loss    Nonischemic cardiomyopathy ([place])    Normal coronary arteries    s/p cardiac cath 2007   Pacemaker    ICD [employer]   Syncope    Systolic CHF [facility])    Vertigo    Wears glasses    Past Surgical History:  Procedure Laterality Date   BACK SURGERY     lumbar fusion    BREAST LUMPECTOMY Left 2008   BREAST SURGERY  2000   LUMP REMOVAL. STAGE 1 CANCER   CARDIAC CATHETERIZATION     CATARACT EXTRACTION     COLONOSCOPY     EYE SURGERY     IMPLANTABLE CARDIOVERTER DEFIBRILLATOR GENERATOR CHANGE N/A 12/18/2012   Procedure: IMPLANTABLE CARDIOVERTER DEFIBRILLATOR GENERATOR  CHANGE;  Surgeon: [name], MD;  Location: [facility] CATH LAB;  Service: Cardiovascular;  Laterality: N/A;   JOINT REPLACEMENT  06/14/01   right   LUMBAR FUSION  2006   MASS EXCISION  11/08/2011   Procedure: EXCISION MASS;  Surgeon: [name], MD;  Location: [facility];  Service: General;  Laterality: Left;  Excision Left Thigh Mass   MASTECTOMY W/ SENTINEL NODE BIOPSY Left 06/04/2019   Procedure: LEFT MASTECTOMY WITH SENTINEL LYMPH NODE BIOPSY;  Surgeon: [name], MD;  Location: [place];  Service: General;  Laterality: Left;   MASTECTOMY, PARTIAL  2008   GOT PACEMAKER AND DEFIB AT THAT TIME   PACEMAKER INSERTION  04/23/06   TOTAL KNEE ARTHROPLASTY  05/17/01   RIGHT KNEE   TOTAL KNEE ARTHROPLASTY Left 11/29/2014   Procedure: TOTAL LEFT KNEE ARTHROPLASTY;  Surgeon: Paralee Cancel, MD;  Location: [facility];  Service: Orthopedics;  Laterality: Left;   Patient Active Problem List   Diagnosis Date Noted   Sciatica associated with disorder of multiple sites of spine 03/20/2021   Bipolar II disorder ([place]) 03/20/2021   Chronic pain  syndrome 03/20/2021   Dementia without behavioral disturbance ([place]) 02/04/2020   Urinary retention 02/04/2020   Seasonal allergies 02/04/2020   Recurrent major depressive disorder, in partial remission ([place]) 07/17/2019   Status post left mastectomy 07/01/2019   Breast cancer of lower-outer quadrant of left female breast ([place]) 06/04/2019   Candida infection, oral 01/15/2019   Senile purpura ([place]) 04/14/2018   Lumbar post-laminectomy syndrome 12/03/2017   Lumbar spondylosis 12/03/2017   History of back surgery 09/01/2017   Constipation 09/01/2017   Hypothyroidism due to acquired atrophy of thyroid 09/01/2017   Mixed hyperlipidemia 09/01/2017   Age-related osteoporosis without current pathological fracture 09/01/2017   High risk medication use 09/01/2017   Arthritis of hand 05/17/2017   Atherosclerosis of native arteries of extremity with intermittent claudication ([place])  05/15/2017   Status post total bilateral knee replacement 12/20/2016   Gait abnormality 07/16/2016   Chronic low back pain 07/16/2016   Mild cognitive impairment 07/16/2016   Wrist pain 05/10/2015   S/P left TKA 11/29/2014   S/P knee replacement 11/29/2014   Spontaneous bruising 08/27/2014   Numbness and tingling in right hand 07/28/2014   Essential tremor 07/28/2014   Dizziness 05/28/2014   Shaky 05/28/2014   Memory loss 05/28/2014   Depression 05/06/2014   Lipoma of left upper thigh 3x5 cm 09/28/2011   Cerebral vascular accident ([place]) 08/09/2011   Syncope 08/09/2011   History of breast cancer [id number], s/p BCT 2008, triple negative 01/26/2011   Chronic L breast pain with chronic recurrent seroma, s/p excisional biopsy 01/12/2010 01/26/2011   Fainted    ICD (implantable cardioverter-defibrillator), biventricular, in situ 60/45/4098   Chronic systolic heart failure ([place]) 08/02/2010   Essential hypertension 08/02/2010   REFERRING DIAG: N39.45 (ICD-10-CM) - Continuous leakage   THERAPY DIAG:  Muscle weakness (generalized)   Cramp and spasm   Other abnormalities of gait and mobility   Other lack of coordination   Rationale for Evaluation and Treatment: Rehabilitation   ONSET DATE: 6 months ago   SUBJECTIVE:                                                                                                                                                                                            SUBJECTIVE STATEMENT: Patient states she increased her water intake but has been up all night going to the bathroom.       PAIN: right hip pain level 5/10. Pain happens with movement.       PRECAUTIONS: ICD/Pacemaker, left breast cancer   WEIGHT BEARING RESTRICTIONS: No   FALLS:  Has patient fallen in last 6 months? No   LIVING ENVIRONMENT:  Lives with: lives with their family     OCCUPATION: retired   PLOF: Independent   PATIENT GOALS: reduce leakage   PERTINENT HISTORY:   Left breast cancer; CHF; Hypothyroidism; Pacemaker; Lumbar fusion; Left knee arthroplasty     URINATION: Pain with urination: No Fully empty bladder: Yes:  Stream: Strong Urgency: Yes:   Frequency: day time voids 4 night times every 2 hours or 1 hour Leakage:  does not feel the leakage Pads: Yes: 2 pads per day, night wears a depends with toilet paper, 3 times at night   PREGNANCY: Vaginal deliveries 1     PROLAPSE: Cystocele   and Urethrocele      OBJECTIVE: (objective measures completed at initial evaluation unless otherwise dated)   DIAGNOSTIC FINDINGS:  Pessary fittings     COGNITION: Overall cognitive status: Within functional limits for tasks assessed                          SENSATION: Light touch: Appears intact Proprioception: Appears intact     FUNCTIONAL TESTS:  5 times sit to stand: 20 sec; 03/19/2022 13 sec but felt urinary leakage while doing the task.  Timed up and go (TUG): 15 sec 03/09/2022: 16 sec, 13 sec, 11 sec   04/18/22: TUG: 14.78 sec 5 times sit to stand: 15.00 sec   GAIT: Assistive device utilized: Single point cane, when she is going out Level of assistance: Complete Independence Comments: small steps and slowly    POSTURE: No Significant postural limitations   PELVIC ALIGNMENT:     LOWER EXTREMITY ROM:   Passive ROM Right eval Left eval  Hip external rotation 25 45   (Blank rows = not tested)   LOWER EXTREMITY MMT:   MMT Right eval Left eval Right 1/3/224 Left 04/11/22  Hip extension 3/5 3/5 4-/5 4-/5  Hip abduction 3/5 3/5 4-/5 4-/5  Hip adduction 3/5 3/5 5/5 5/5    PALPATION:    External Perineal Exam some redness in the vulva area                             Internal Pelvic Floor tightness in the introitus. She would contract her gluteal more than the vaginal but after tactile cues was able to isolate the vaginal canal.    Patient confirms identification and approves PT to assess internal pelvic floor and treatment  Yes   PELVIC MMT:   MMT eval 03/09/2022 03/28/22 04/23/22  Vaginal 1/5 2/5 3/5 with weak hug 3/5        TONE: Low tone   PROLAPSE: Had pessary in   TODAY'S TREATMENT:   04/25/2022 Exercises: Nustep x 5 min level 5 (PT present to discuss status) Strengthening: (all exercises, patient is instructed to focus on pelvic floor contraction) Seated hip adduction with pink ball 2 x 10 with pelvic floor contraction Sitting press ball into legs holding for 5 sec 2 sets of 10 with pelvic floor contraction Seated clam 2 x 10 blue loop with pelvic floor contraction Mini sit ups with red plyo ball 2 x 10 with pelvic floor contraction Seated modified russian twist x 10 each side with red plyo ball with pelvic floor contraction Sit to stand x 10 holding red plyo ball with pelvic floor contraction Hook lying butterfly stretch with self manual overpressure 5 times holding 10 sec each both Hip adductor stretch with strap 5 times hold 5 seconds  right IT band stretch right x 5 holding 10 sec each Right hamstring stretch x 5 holding 10 sec each Bridging x 10 Lower trunk rotation maintaining pelvic floor contraction x 20 Educated patient again on importance of fluid intake and possibly discussing with MD the issue of being up all night when she takes in more fluid.  04/23/22 Manual: Internal pelvic floor techniques: No emotional/communication barriers or cognitive limitation. Patient is motivated to learn. Patient understands and agrees with treatment goals and plan. PT explains patient will be examined in standing, sitting, and lying down to see how their muscles and joints work. When they are ready, they will be asked to remove their underwear so PT can examine their perineum. The patient is also given the option of providing their own chaperone as one is not provided in our facility. The patient also has the right and is explained the right to defer or refuse any part of the evaluation or treatment  including the internal exam. With the patient's consent, PT will use one gloved finger to gently assess the muscles of the pelvic floor, seeing how well it contracts and relaxes and if there is muscle symmetry. After, the patient will get dressed and PT and patient will discuss exam findings and plan of care. PT and patient discuss plan of care, schedule, attendance policy and HEP activities.  Placed index finger into the vaginal canal to assess strength Neuromuscular re-education: Pelvic floor contraction training: Pelvic floor EMG with surface electrodes on the perineal area working in supine and standing pelvic floor contractions quickly and holding 5 sec.  Pelvic floor contraction with marching and mini squats.  Down training: Educated patient on how to deter the urge to urinate during the night so she is not going to the bathroom every hour.  Exercises: Strengthening: Nustep x 7 min level 5 (PT present to discuss status)    04/18/2022 Exercises: Nustep x 5 min level 5 (PT present to discuss status) Strengthening: Seated hip adduction with pink ball 2 x 10 Seated clam 2 x 10 Sitting press ball into legs holding for 5 sec 2 sets of 10 with pelvic floor contraction Mini sit ups with red plyo ball 2 x 10 Seated modified russian twist x 10 each side with red plyo ball with pelvic floor contraction Sit to stand 2 x 10 holding red plyo ball Seated hamstring curl with red band 2 x 10 each LE with ball squeeze and pelvic floor contraction TUG and 5 times sit to stand completed: see above       PATIENT EDUCATION:  03/28/2022 Education details: Access Code: [email or web address] Person educated: Patient Education method: Consulting civil engineer, Demonstration, Corporate treasurer cues, Verbal cues, and Handouts Education comprehension: verbalized understanding, returned demonstration, verbal cues required, tactile cues required, and needs further education   HOME EXERCISE PROGRAM: 03/28/22  Access Code: [email or web address] URL:  [email or web address] Date: 03/28/2022 Prepared by: Earlie Counts   Exercises - Supine Pelvic Floor Contraction  - 3 x daily - 7 x weekly - 1 sets - 10 reps - 5 sec hold - Supine Piriformis Stretch with Foot on Ground  - 1 x daily - 7 x weekly - 2 reps - 20 hold - Supine Bridge with Pelvic Floor Contraction  - 1 x daily - 7 x weekly - 3 sets - 10 reps - Seated Long Arc Quad  - 1 x daily - 7 x weekly - 2 sets - 10 reps - Seated March  - 1 x daily - 7 x  weekly - 2 sets - 10 reps - Seated Abdominal Press into [employer]  - 1 x daily - 7 x weekly - 1 sets - 10 reps - Supine Shoulder Horizontal Abduction with Resistance  - 1 x daily - 3 x weekly - 1 sets - 10 reps - Supine Shoulder Flexion Extension AAROM with Dowel  - 1 x daily - 3 x weekly - 1 sets - 10 reps - Seated Pelvic Floor Contraction  - 3 x daily - 7 x weekly - 1 sets - 10 reps - 5 sec hold     ASSESSMENT:   CLINICAL IMPRESSION: Patient tolerated all exercises with moderate fatigue.  She is beginning to have pronounced bilateral knee and right hip pain that has slowed her activity.  We addressed the hip tightness with stretches today.  She did well with this.  Patient will benefit from skilled therapy to improve balance, strength and pelvic floor coordination to reduce leakage and walk safely to the bathroom.    OBJECTIVE IMPAIRMENTS: decreased activity tolerance, decreased coordination, decreased endurance, decreased mobility, difficulty walking, decreased strength, and increased fascial restrictions.    ACTIVITY LIMITATIONS: standing, transfers, continence, toileting, and locomotion level   PARTICIPATION LIMITATIONS: shopping and community activity   PERSONAL FACTORS: Age, Fitness, Past/current experiences, and 1-2 comorbidities: Left breast cancer; CHF; Hypothyroidism; Pacemaker; Lumbar fusion; Left knee arthroplasty  are also affecting patient's functional outcome.    REHAB POTENTIAL: Good   CLINICAL DECISION  MAKING: Evolving/moderate complexity   EVALUATION COMPLEXITY: Moderate     GOALS: Goals reviewed with patient? Yes   SHORT TERM GOALS: Target date: 03/23/2022   Patient independent with initial HEP for pelvic floor contraction.  Baseline: Goal status: Goal met 03/05/22   2.  Patient is able to feel the urinary leakage due to improve pelvic floor tone.  Baseline:  Goal status: Met 03/19/2022   3.  Patient educated on the urge to void to be able to walk to the bathroom slowl Baseline:  Goal status: Met 04/11/22   4.  Timed up and go </= 14 sec Baseline:  Goal status: Met 03/09/2022   5.  Sit to stand </- 18 sec  Baseline:  Goal status: Met 03/19/2020     LONG TERM GOALS: Target date: 05/18/2021   Patient independent with advanced hep for pelvic floor strength and balance exercises.  Baseline:  Goal status: ongoing 03/28/22   2.  Patient reports her urinary leakage is </= 50% due to improved pelvic floor strength >/ 3/5 Baseline: Patient reports her pads are 30% dryer.  Goal status: ongoing 03/28/22   3.  Time up and go test is </= 13 sec. So she is able to walk to the commode safely when she has the urge to urinate.  Baseline:  Goal status: INITIAL   4.  Sit to stand score is </= 15 sec so she is able to safely get out of the chair or get up from the commode.  Baseline:  Goal status: Met 04/23/22   5.  Patient is able to wake up 2 times at night instead of every hour due to increased in pelvic floor strength and understanding on how to use the urge to void behavioral technique.  Baseline: when not drinking much during the day wake up every 2 hours and increase her drinking it is every hour Goal status: ongoing 03/28/22     PLAN:   PT FREQUENCY: 1-2x/week   PT DURATION: 12 weeks   PLANNED  INTERVENTIONS: Therapeutic exercises, Therapeutic activity, Neuromuscular re-education, Balance training, Gait training, Patient/Family education, Self Care, Biofeedback, and  Manual therapy   PLAN FOR NEXT SESSION: Continue with hip strength. Progress right hip stretching.   All exercises should include pelvic floor contraction.    Anderson Malta B. Breyton Vanscyoc, PT 04/25/22 11:51 AM  [place] [address], [place], [place] [address] Phone # [phone] Fax [phone]

## 2022-04-27 ENCOUNTER — Other Ambulatory Visit: Payer: Self-pay | Admitting: Nurse Practitioner

## 2022-04-27 ENCOUNTER — Other Ambulatory Visit: Payer: Self-pay | Admitting: Internal Medicine

## 2022-04-30 ENCOUNTER — Encounter: Payer: Self-pay | Admitting: Physical Therapy

## 2022-04-30 ENCOUNTER — Encounter
Payer: Medicare Other | Attending: Physical Medicine and Rehabilitation | Admitting: Physical Medicine and Rehabilitation

## 2022-04-30 ENCOUNTER — Ambulatory Visit: Payer: Medicare Other | Admitting: Physical Therapy

## 2022-04-30 ENCOUNTER — Encounter: Payer: Self-pay | Admitting: Physical Medicine and Rehabilitation

## 2022-04-30 VITALS — BP 136/74 | HR 74 | Ht <= 58 in | Wt 115.0 lb

## 2022-04-30 DIAGNOSIS — M961 Postlaminectomy syndrome, not elsewhere classified: Secondary | ICD-10-CM

## 2022-04-30 DIAGNOSIS — G8929 Other chronic pain: Secondary | ICD-10-CM

## 2022-04-30 DIAGNOSIS — M5441 Lumbago with sciatica, right side: Secondary | ICD-10-CM | POA: Diagnosis not present

## 2022-04-30 DIAGNOSIS — R269 Unspecified abnormalities of gait and mobility: Secondary | ICD-10-CM

## 2022-04-30 DIAGNOSIS — M5442 Lumbago with sciatica, left side: Secondary | ICD-10-CM | POA: Diagnosis present

## 2022-04-30 DIAGNOSIS — R2689 Other abnormalities of gait and mobility: Secondary | ICD-10-CM

## 2022-04-30 DIAGNOSIS — M6281 Muscle weakness (generalized): Secondary | ICD-10-CM | POA: Diagnosis not present

## 2022-04-30 DIAGNOSIS — R252 Cramp and spasm: Secondary | ICD-10-CM

## 2022-04-30 DIAGNOSIS — M47816 Spondylosis without myelopathy or radiculopathy, lumbar region: Secondary | ICD-10-CM

## 2022-04-30 MED ORDER — PREGABALIN 25 MG PO CAPS: 50.0000 mg | ORAL_CAPSULE | Freq: Every evening | ORAL | 5 refills | Status: DC

## 2022-04-30 NOTE — Progress Notes (Signed)
Subjective:    Patient ID: [name], female    DOB: [birth year], 89 y.o.   MRN: [id number]  HPI  Pt is an 89 yr old female with hx of breast CA and RUE lymphedema, as well as uterine prolapse, NICCM- as well as Chronic low back pain with B/L sciatica- also has memory issues on Aricept; hx of lumbar fusion at L4/5. Has a hx of Bipolar Type II depression. Is on Zoloft 200 mg daily, Trilpetal and Wellbutrin for mood.  Here for f/u on Lumbar radiculopathy    Sleepiness got moderately better when moved Duloxetine ti night time Much fewer complaints per her daughter about being sleepy during day.   Pain stayed about the same when we stopped daytime Lyrica.  Taking Lyrica 25 mg QHS, not 50 mg- which was offered.   Never tried the 50 mg QHS.   Didn't want to get more sleepy during day.      Pain Inventory Average Pain 6 Pain Right Now 6 My pain is dull and aching  In the last 24 hours, has pain interfered with the following? General activity 0 Relation with others 0 Enjoyment of life 0 What TIME of day is your pain at its worst? morning  Sleep (in general) Fair  Pain is worse with: walking, bending, standing, unsure, and some activites Pain improves with: rest, therapy/exercise, pacing activities, and medication Relief from Meds: 7  Family History  Problem Relation Age of Onset   Hypertension Mother    Arthritis Mother    Hypertension Father    Hypertension Brother    Hypertension Brother    Social History   Socioeconomic History   Marital status: Married    Spouse name: Not on file   Number of children: 1   Years of education: Masters   Highest education level: Not on file  Occupational History   Occupation: Retired  Tobacco Use   Smoking status: Never    Passive exposure: Never   Smokeless tobacco: Never  Vaping Use   Vaping Use: Never used  Substance and Sexual Activity   Alcohol use: No   Drug use: No   Sexual activity: Not Currently    Birth  control/protection: Post-menopausal  Other Topics Concern   Not on file  Social History Narrative   Lives at home with husband.   Right-handed.      As of 07/28/2014   Diet: No special diet   Caffeine: yes, Chocolate, tea and sodas    Married: YES, 1970   House: Yes, 2 stories, 2-3 persons live in home   Pets: No   Current/Past profession: Engineer, mining, Designer, jewellery    Exercise: Yes 2-3 x weekly   Living Will: Yes   DNR: No   POA/HPOA: No      Social Determinants of Health   Financial Resource Strain: Low Risk  (03/27/2017)   Overall Financial Resource Strain (CARDIA)    Difficulty of Paying Living Expenses: Not hard at all  Food Insecurity: No Food Insecurity (03/27/2017)   Hunger Vital Sign    Worried About Running Out of Food in the Last Year: Never true    [place] in the Last Year: Never true  Transportation Needs: No Transportation Needs (03/27/2017)   PRAPARE - Hydrologist (Medical): No    Lack of Transportation (Non-Medical): No  Physical Activity: Insufficiently Active (03/27/2017)   Exercise Vital Sign    Days of Exercise per  Week: 4 days    Minutes of Exercise per Session: 30 min  Stress: Stress Concern Present (03/27/2017)   [place]    Feeling of Stress : To some extent  Social Connections: Socially Integrated (03/27/2017)   Social Connection and Isolation Panel [NHANES]    Frequency of Communication with Friends and Family: More than three times a week    Frequency of Social Gatherings with Friends and Family: Once a week    Attends Religious Services: More than 4 times per year    Active Member of [employer] or Organizations: Yes    Attends Archivist Meetings: Never    Marital Status: Married   Past Surgical History:  Procedure Laterality Date   BACK SURGERY     lumbar fusion    BREAST LUMPECTOMY Left 2008   BREAST SURGERY  2000    LUMP REMOVAL. STAGE 1 CANCER   CARDIAC CATHETERIZATION     CATARACT EXTRACTION     COLONOSCOPY     EYE SURGERY     IMPLANTABLE CARDIOVERTER DEFIBRILLATOR GENERATOR CHANGE N/A 12/18/2012   Procedure: IMPLANTABLE CARDIOVERTER DEFIBRILLATOR GENERATOR CHANGE;  Surgeon: [name], MD;  Location: [facility] CATH LAB;  Service: Cardiovascular;  Laterality: N/A;   JOINT REPLACEMENT  06/14/01   right   LUMBAR FUSION  2006   MASS EXCISION  11/08/2011   Procedure: EXCISION MASS;  Surgeon: [name], MD;  Location: [facility];  Service: General;  Laterality: Left;  Excision Left Thigh Mass   MASTECTOMY W/ SENTINEL NODE BIOPSY Left 06/04/2019   Procedure: LEFT MASTECTOMY WITH SENTINEL LYMPH NODE BIOPSY;  Surgeon: [name], MD;  Location: [place];  Service: General;  Laterality: Left;   MASTECTOMY, PARTIAL  2008   GOT PACEMAKER AND DEFIB AT THAT TIME   PACEMAKER INSERTION  04/23/06   TOTAL KNEE ARTHROPLASTY  05/17/01   RIGHT KNEE   TOTAL KNEE ARTHROPLASTY Left 11/29/2014   Procedure: TOTAL LEFT KNEE ARTHROPLASTY;  Surgeon: Paralee Cancel, MD;  Location: [facility];  Service: Orthopedics;  Laterality: Left;   Past Surgical History:  Procedure Laterality Date   BACK SURGERY     lumbar fusion    BREAST LUMPECTOMY Left 2008   BREAST SURGERY  2000   LUMP REMOVAL. STAGE 1 CANCER   CARDIAC CATHETERIZATION     CATARACT EXTRACTION     COLONOSCOPY     EYE SURGERY     IMPLANTABLE CARDIOVERTER DEFIBRILLATOR GENERATOR CHANGE N/A 12/18/2012   Procedure: IMPLANTABLE CARDIOVERTER DEFIBRILLATOR GENERATOR CHANGE;  Surgeon: [name], MD;  Location: [facility] CATH LAB;  Service: Cardiovascular;  Laterality: N/A;   JOINT REPLACEMENT  06/14/01   right   LUMBAR FUSION  2006   MASS EXCISION  11/08/2011   Procedure: EXCISION MASS;  Surgeon: [name], MD;  Location: [facility];  Service: General;  Laterality: Left;  Excision Left Thigh Mass   MASTECTOMY W/ SENTINEL NODE BIOPSY Left 06/04/2019   Procedure: LEFT MASTECTOMY WITH SENTINEL  LYMPH NODE BIOPSY;  Surgeon: [name], MD;  Location: [place];  Service: General;  Laterality: Left;   MASTECTOMY, PARTIAL  2008   GOT PACEMAKER AND DEFIB AT THAT TIME   PACEMAKER INSERTION  04/23/06   TOTAL KNEE ARTHROPLASTY  05/17/01   RIGHT KNEE   TOTAL KNEE ARTHROPLASTY Left 11/29/2014   Procedure: TOTAL LEFT KNEE ARTHROPLASTY;  Surgeon: Paralee Cancel, MD;  Location: [facility];  Service: Orthopedics;  Laterality: Left;   Past  Medical History:  Diagnosis Date   Arthritis    Cancer ([place])    left breast cancer    Cataracts, bilateral    removed by surgery   CHF (congestive heart failure) ([place])    PACEMAKER & DEFIB   Complication of anesthesia    hypotensive after back surgery in 2006   Depression    Dyslipidemia    Fainted 04/21/06   AT [facility]   GERD (gastroesophageal reflux disease)    Headache(784.0)    Hearing loss    bilateral hearing aids   HLD (hyperlipidemia)    diet controlled    Hypertension    Hypothyroidism    ICD (implantable cardiac defibrillator) in place    pt has pacer/icd   ICD (implantable cardiac defibrillator), biventricular, in situ    LBBB (left bundle branch block)    Memory loss    Nonischemic cardiomyopathy ([place])    Normal coronary arteries    s/p cardiac cath 2007   Pacemaker    ICD [employer]   Syncope    Systolic CHF [facility])    Vertigo    Wears glasses    Ht '4\' 10"'$  (1.473 m)   LMP  (LMP Unknown)   BMI 23.87 kg/m   Opioid Risk Score:   Fall Risk Score:  `1  Depression screen [facility] 2/9     01/22/2022   11:26 AM 10/02/2021    3:18 PM 06/19/2021    1:44 PM 03/20/2021    1:11 PM 02/04/2020   10:10 AM 11/06/2019    2:41 PM 04/15/2019   10:15 AM  Depression screen PHQ 2/9  Decreased Interest 1 0 0 2 0 3 0  Down, Depressed, Hopeless 1 0 0 2 0 1 0  PHQ - 2 Score 2 0 0 4 0 4 0  Altered sleeping    2  3   Tired, decreased energy    3  3   Change in appetite    1  0   Feeling bad or failure about yourself     1  0   Trouble  concentrating    1  1   Moving slowly or fidgety/restless    1  0   Suicidal thoughts    0  0   PHQ-9 Score    13  11   Difficult doing work/chores    Somewhat difficult        Review of Systems  Musculoskeletal:  Positive for back pain and gait problem.  All other systems reviewed and are negative.     Objective:   Physical Exam Awake, alert, appropriate, less frail appearing today; accompanied by daughter, using single point cane with quad cover on it, NAD  TTP in band across low back- B/L sides; (+) paraspinals and midline.        Assessment & Plan:    Pt is an 89 yr old female with hx of breast CA and RUE lymphedema, as well as uterine prolapse, NICCM- as well as Chronic low back pain with B/L sciatica- also has memory issues on Aricept; hx of lumbar fusion at L4/5. Has a hx of Bipolar Type II depression. Is on Zoloft 200 mg daily, Trilpetal and Wellbutrin for mood.  Here for f/u on Lumbar radiculopathy    Will try Increasing Lyrica to 50 mg nightly- if DOESN"T make SLEEPY during day, will maintain that dose and see if helps her pain - usually will help within 1-2 weeks- with maximal effect at  4 weeks.   Will send in Lyrica 25 mg tabs- 50 mg nightly- and see how it goes.    2. Needs refill of Naltrexone 4 mg nightly- will wait to increase Naltrexone to 5 mg- sent in 1 year supply of 4 mg nightly-   3. Con't Cymbalta - per psychiatry- but also helps back pain.   4. F/u in 31month, but call me at 1 month IF things aren't better- and will increase naltrexone to 5 mg nightly.    I spent a total of   20 minutes on total care today- >50% coordination of care- due to d/w pt and daughter about naltrexone- calling in Rx from room; and discussing f/u to maybe increase Naltrexone.

## 2022-04-30 NOTE — Patient Instructions (Signed)
Pt is an 89 yr old female with hx of breast CA and RUE lymphedema, as well as uterine prolapse, NICCM- as well as Chronic low back pain with B/L sciatica- also has memory issues on Aricept; hx of lumbar fusion at L4/5. Has a hx of Bipolar Type II depression. Is on Zoloft 200 mg daily, Trilpetal and Wellbutrin for mood.  Here for f/u on Lumbar radiculopathy    Will try Increasing Lyrica to 50 mg nightly- if DOESN"T make SLEEPY during day, will maintain that dose and see if helps her pain - usually will help within 1-2 weeks- with maximal effect at 4 weeks.   Will send in Lyrica 25 mg tabs- 50 mg nightly- and see how it goes.    2. Needs refill of Naltrexone 4 mg nightly- will wait to increase Naltrexone to 5 mg- sent in 1 year supply of 4 mg nightly-   3. Con't Cymbalta - per psychiatry- but also helps back pain.   4. F/u in 91month, but call me at 1 month IF things aren't better- and will increase naltrexone to 5 mg nightly.

## 2022-04-30 NOTE — Therapy (Signed)
OUTPATIENT PHYSICAL THERAPY TREATMENT NOTE   Patient Name: [name] MRN: [id number] DOB:08-20-1933, 89 y.o., female Today's Date: 04/30/2022  PCP: [name], NP   REFERRING PROVIDER: [name], MD   END OF SESSION:   PT End of Session - 04/30/22 1405     Visit Number 17    Date for PT Re-Evaluation 05/18/22    Authorization Type [employer]    Authorization - Visit Number 32    Authorization - Number of Visits 20    Progress Note Due on Visit 20    PT Start Time 1400    PT Stop Time 1440    PT Time Calculation (min) 40 min    Activity Tolerance Patient tolerated treatment well    Behavior During Therapy WFL for tasks assessed/performed             Past Medical History:  Diagnosis Date   Arthritis    Cancer ([place])    left breast cancer    Cataracts, bilateral    removed by surgery   CHF (congestive heart failure) ([place])    PACEMAKER & DEFIB   Complication of anesthesia    hypotensive after back surgery in 2006   Depression    Dyslipidemia    Fainted 04/21/06   AT [facility]   GERD (gastroesophageal reflux disease)    Headache(784.0)    Hearing loss    bilateral hearing aids   HLD (hyperlipidemia)    diet controlled    Hypertension    Hypothyroidism    ICD (implantable cardiac defibrillator) in place    pt has pacer/icd   ICD (implantable cardiac defibrillator), biventricular, in situ    LBBB (left bundle branch block)    Memory loss    Nonischemic cardiomyopathy ([place])    Normal coronary arteries    s/p cardiac cath 2007   Pacemaker    ICD [employer]   Syncope    Systolic CHF [facility])    Vertigo    Wears glasses    Past Surgical History:  Procedure Laterality Date   BACK SURGERY     lumbar fusion    BREAST LUMPECTOMY Left 2008   BREAST SURGERY  2000   LUMP REMOVAL. STAGE 1 CANCER   CARDIAC CATHETERIZATION     CATARACT EXTRACTION     COLONOSCOPY     EYE SURGERY     IMPLANTABLE CARDIOVERTER DEFIBRILLATOR GENERATOR CHANGE N/A  12/18/2012   Procedure: IMPLANTABLE CARDIOVERTER DEFIBRILLATOR GENERATOR CHANGE;  Surgeon: [name], MD;  Location: [facility] CATH LAB;  Service: Cardiovascular;  Laterality: N/A;   JOINT REPLACEMENT  06/14/01   right   LUMBAR FUSION  2006   MASS EXCISION  11/08/2011   Procedure: EXCISION MASS;  Surgeon: [name], MD;  Location: [facility] ORS;  Service: General;  Laterality: Left;  Excision Left Thigh Mass   MASTECTOMY W/ SENTINEL NODE BIOPSY Left 06/04/2019   Procedure: LEFT MASTECTOMY WITH SENTINEL LYMPH NODE BIOPSY;  Surgeon: [name], MD;  Location: [facility];  Service: General;  Laterality: Left;   MASTECTOMY, PARTIAL  2008   GOT PACEMAKER AND DEFIB AT THAT TIME   PACEMAKER INSERTION  04/23/06   TOTAL KNEE ARTHROPLASTY  05/17/01   RIGHT KNEE   TOTAL KNEE ARTHROPLASTY Left 11/29/2014   Procedure: TOTAL LEFT KNEE ARTHROPLASTY;  Surgeon: Paralee Cancel, MD;  Location: [facility];  Service: Orthopedics;  Laterality: Left;   Patient Active Problem List   Diagnosis Date Noted   Sciatica associated with disorder  of multiple sites of spine 03/20/2021   Bipolar II disorder ([place]) 03/20/2021   Chronic pain syndrome 03/20/2021   Dementia without behavioral disturbance ([place]) 02/04/2020   Urinary retention 02/04/2020   Seasonal allergies 02/04/2020   Recurrent major depressive disorder, in partial remission ([place]) 07/17/2019   Status post left mastectomy 07/01/2019   Breast cancer of lower-outer quadrant of left female breast ([place]) 06/04/2019   Candida infection, oral 01/15/2019   Senile purpura ([place]) 04/14/2018   Lumbar post-laminectomy syndrome 12/03/2017   Lumbar spondylosis 12/03/2017   History of back surgery 09/01/2017   Constipation 09/01/2017   Hypothyroidism due to acquired atrophy of thyroid 09/01/2017   Mixed hyperlipidemia 09/01/2017   Age-related osteoporosis without current pathological fracture 09/01/2017   High risk medication use 09/01/2017   Arthritis of hand 05/17/2017   Atherosclerosis  of native arteries of extremity with intermittent claudication ([place]) 05/15/2017   Status post total bilateral knee replacement 12/20/2016   Gait abnormality 07/16/2016   Chronic low back pain 07/16/2016   Mild cognitive impairment 07/16/2016   Wrist pain 05/10/2015   S/P left TKA 11/29/2014   S/P knee replacement 11/29/2014   Spontaneous bruising 08/27/2014   Numbness and tingling in right hand 07/28/2014   Essential tremor 07/28/2014   Dizziness 05/28/2014   Shaky 05/28/2014   Memory loss 05/28/2014   Depression 05/06/2014   Lipoma of left upper thigh 3x5 cm 09/28/2011   Cerebral vascular accident ([place]) 08/09/2011   Syncope 08/09/2011   History of breast cancer [id number], s/p BCT 2008, triple negative 01/26/2011   Chronic L breast pain with chronic recurrent seroma, s/p excisional biopsy 01/12/2010 01/26/2011   Fainted    ICD (implantable cardioverter-defibrillator), biventricular, in situ 03/88/8280   Chronic systolic heart failure ([place]) 08/02/2010   Essential hypertension 08/02/2010   REFERRING DIAG: N39.45 (ICD-10-CM) - Continuous leakage   THERAPY DIAG:  Muscle weakness (generalized)   Cramp and spasm   Other abnormalities of gait and mobility   Other lack of coordination   Rationale for Evaluation and Treatment: Rehabilitation   ONSET DATE: 6 months ago   SUBJECTIVE:                                                                                                                                                                                            SUBJECTIVE STATEMENT: I am going to the bathroom every 2 hours. Urinary leakage and getting up at night is better. Hip pain comes and goes.    PAIN: right hip pain level 5/10. Pain happens with movement.       PRECAUTIONS: ICD/Pacemaker, left breast cancer  WEIGHT BEARING RESTRICTIONS: No   FALLS:  Has patient fallen in last 6 months? No   LIVING ENVIRONMENT: Lives with: lives with their family      OCCUPATION: retired   PLOF: Independent   PATIENT GOALS: reduce leakage   PERTINENT HISTORY:  Left breast cancer; CHF; Hypothyroidism; Pacemaker; Lumbar fusion; Left knee arthroplasty     URINATION: Pain with urination: No Fully empty bladder: Yes:  Stream: Strong Urgency: Yes:   Frequency: day time voids 4 night times every 2 hours or 1 hour Leakage:  does not feel the leakage Pads: Yes: 2 pads per day, night wears a depends with toilet paper, 3 times at night   PREGNANCY: Vaginal deliveries 1     PROLAPSE: Cystocele   and Urethrocele      OBJECTIVE: (objective measures completed at initial evaluation unless otherwise dated)   DIAGNOSTIC FINDINGS:  Pessary fittings     COGNITION: Overall cognitive status: Within functional limits for tasks assessed                          SENSATION: Light touch: Appears intact Proprioception: Appears intact     FUNCTIONAL TESTS:  5 times sit to stand: 20 sec; 03/19/2022 13 sec but felt urinary leakage while doing the task.  Timed up and go (TUG): 15 sec 03/09/2022: 16 sec, 13 sec, 11 sec   04/18/22: TUG: 14.78 sec 5 times sit to stand: 15.00 sec   GAIT: Assistive device utilized: Single point cane, when she is going out Level of assistance: Complete Independence Comments: small steps and slowly    POSTURE: No Significant postural limitations   PELVIC ALIGNMENT:     LOWER EXTREMITY ROM:   Passive ROM Right eval Left eval  Hip external rotation 25 45   (Blank rows = not tested)   LOWER EXTREMITY MMT:   MMT Right eval Left eval Right 1/3/224 Left 04/11/22  Hip extension 3/5 3/5 4-/5 4-/5  Hip abduction 3/5 3/5 4-/5 4-/5  Hip adduction 3/5 3/5 5/5 5/5    PALPATION:    External Perineal Exam some redness in the vulva area                             Internal Pelvic Floor tightness in the introitus. She would contract her gluteal more than the vaginal but after tactile cues was able to isolate the vaginal  canal.    Patient confirms identification and approves PT to assess internal pelvic floor and treatment Yes   PELVIC MMT:   MMT eval 03/09/2022 03/28/22 04/23/22  Vaginal 1/5 2/5 3/5 with weak hug 3/5        TONE: Low tone   PROLAPSE: Had pessary in   TODAY'S TREATMENT:   04/30/22 Exercises: Strengthening: all exercises patient contracts her pelvic floor Nustep x 5 min level 5 (PT present to discuss status) Sit to stand x 10 holding red plyo ball with pelvic floor contraction Seated holding red plyoball and lean back with pelvic floor contraction and engaging the abdominals Lower trunk rotation maintaining pelvic floor contraction x 20 Bridging x 10 with ball squeeze and hold red plyoball at shoulder height Supine with foot on ball and perform heel slides 10 each side with pelvic floor contraction Supine with ball squeeze moving the red ball up and down with pelvic floor contraction.  Step over orange crossbar and step back to work on picking  up her feet and take bigger steps    04/25/2022 Exercises: Nustep x 5 min level 5 (PT present to discuss status) Strengthening: (all exercises, patient is instructed to focus on pelvic floor contraction) Seated hip adduction with pink ball 2 x 10 with pelvic floor contraction Sitting press ball into legs holding for 5 sec 2 sets of 10 with pelvic floor contraction Seated clam 2 x 10 blue loop with pelvic floor contraction Mini sit ups with red plyo ball 2 x 10 with pelvic floor contraction Seated modified russian twist x 10 each side with red plyo ball with pelvic floor contraction Sit to stand x 10 holding red plyo ball with pelvic floor contraction Hook lying butterfly stretch with self manual overpressure 5 times holding 10 sec each both Hip adductor stretch with strap 5 times hold 5 seconds right IT band stretch right x 5 holding 10 sec each Right hamstring stretch x 5 holding 10 sec each Bridging x 10 Lower trunk rotation maintaining  pelvic floor contraction x 20 Educated patient again on importance of fluid intake and possibly discussing with MD the issue of being up all night when she takes in more fluid.   04/23/22 Manual: Internal pelvic floor techniques: No emotional/communication barriers or cognitive limitation. Patient is motivated to learn. Patient understands and agrees with treatment goals and plan. PT explains patient will be examined in standing, sitting, and lying down to see how their muscles and joints work. When they are ready, they will be asked to remove their underwear so PT can examine their perineum. The patient is also given the option of providing their own chaperone as one is not provided in our facility. The patient also has the right and is explained the right to defer or refuse any part of the evaluation or treatment including the internal exam. With the patient's consent, PT will use one gloved finger to gently assess the muscles of the pelvic floor, seeing how well it contracts and relaxes and if there is muscle symmetry. After, the patient will get dressed and PT and patient will discuss exam findings and plan of care. PT and patient discuss plan of care, schedule, attendance policy and HEP activities.  Placed index finger into the vaginal canal to assess strength Neuromuscular re-education: Pelvic floor contraction training: Pelvic floor EMG with surface electrodes on the perineal area working in supine and standing pelvic floor contractions quickly and holding 5 sec.  Pelvic floor contraction with marching and mini squats.  Down training: Educated patient on how to deter the urge to urinate during the night so she is not going to the bathroom every hour.  Exercises: Strengthening: Nustep x 7 min level 5 (PT present to discuss status)    04/18/2022 Exercises: Nustep x 5 min level 5 (PT present to discuss status) Strengthening: Seated hip adduction with pink ball 2 x 10 Seated clam 2 x  10 Sitting press ball into legs holding for 5 sec 2 sets of 10 with pelvic floor contraction Mini sit ups with red plyo ball 2 x 10 Seated modified russian twist x 10 each side with red plyo ball with pelvic floor contraction Sit to stand 2 x 10 holding red plyo ball Seated hamstring curl with red band 2 x 10 each LE with ball squeeze and pelvic floor contraction TUG and 5 times sit to stand completed: see above       PATIENT EDUCATION:  03/28/2022 Education details: Access Code: [email or web address] Person educated: Patient Education method:  Explanation, Demonstration, Tactile cues, Verbal cues, and Handouts Education comprehension: verbalized understanding, returned demonstration, verbal cues required, tactile cues required, and needs further education   HOME EXERCISE PROGRAM: 03/28/22  Access Code: [email or web address] URL: [email or web address] Date: 03/28/2022 Prepared by: Earlie Counts   Exercises - Supine Pelvic Floor Contraction  - 3 x daily - 7 x weekly - 1 sets - 10 reps - 5 sec hold - Supine Piriformis Stretch with Foot on Ground  - 1 x daily - 7 x weekly - 2 reps - 20 hold - Supine Bridge with Pelvic Floor Contraction  - 1 x daily - 7 x weekly - 3 sets - 10 reps - Seated Long Arc Quad  - 1 x daily - 7 x weekly - 2 sets - 10 reps - Seated March  - 1 x daily - 7 x weekly - 2 sets - 10 reps - Seated Abdominal Press into [employer]  - 1 x daily - 7 x weekly - 1 sets - 10 reps - Supine Shoulder Horizontal Abduction with Resistance  - 1 x daily - 3 x weekly - 1 sets - 10 reps - Supine Shoulder Flexion Extension AAROM with Dowel  - 1 x daily - 3 x weekly - 1 sets - 10 reps - Seated Pelvic Floor Contraction  - 3 x daily - 7 x weekly - 1 sets - 10 reps - 5 sec hold     ASSESSMENT:   CLINICAL IMPRESSION: Patient tolerated all exercises with moderate fatigue.  Patient has difficulty with lifting her feet up and taking bigger steps. She feels like she will fall backward when she lifts her  feet up high. Patient reports her pads are 30% dryer. She is able to wait for 2 hours to urinate.   Patient will benefit from skilled therapy to improve balance, strength and pelvic floor coordination to reduce leakage and walk safely to the bathroom.    OBJECTIVE IMPAIRMENTS: decreased activity tolerance, decreased coordination, decreased endurance, decreased mobility, difficulty walking, decreased strength, and increased fascial restrictions.    ACTIVITY LIMITATIONS: standing, transfers, continence, toileting, and locomotion level   PARTICIPATION LIMITATIONS: shopping and community activity   PERSONAL FACTORS: Age, Fitness, Past/current experiences, and 1-2 comorbidities: Left breast cancer; CHF; Hypothyroidism; Pacemaker; Lumbar fusion; Left knee arthroplasty  are also affecting patient's functional outcome.    REHAB POTENTIAL: Good   CLINICAL DECISION MAKING: Evolving/moderate complexity   EVALUATION COMPLEXITY: Moderate     GOALS: Goals reviewed with patient? Yes   SHORT TERM GOALS: Target date: 03/23/2022   Patient independent with initial HEP for pelvic floor contraction.  Baseline: Goal status: Goal met 03/05/22   2.  Patient is able to feel the urinary leakage due to improve pelvic floor tone.  Baseline:  Goal status: Met 03/19/2022   3.  Patient educated on the urge to void to be able to walk to the bathroom slowl Baseline:  Goal status: Met 04/11/22   4.  Timed up and go </= 14 sec Baseline:  Goal status: Met 03/09/2022   5.  Sit to stand </- 18 sec  Baseline:  Goal status: Met 03/19/2020     LONG TERM GOALS: Target date: 05/18/2021   Patient independent with advanced hep for pelvic floor strength and balance exercises.  Baseline:  Goal status: ongoing 03/28/22   2.  Patient reports her urinary leakage is </= 50% due to improved pelvic floor strength >/ 3/5 Baseline: Patient reports her pads are 30% dryer.  Goal  status: ongoing 03/28/22   3.  Time up and go  test is </= 13 sec. So she is able to walk to the commode safely when she has the urge to urinate.  Baseline:  Goal status: INITIAL   4.  Sit to stand score is </= 15 sec so she is able to safely get out of the chair or get up from the commode.  Baseline:  Goal status: Met 04/23/22   5.  Patient is able to wake up 2 times at night instead of every hour due to increased in pelvic floor strength and understanding on how to use the urge to void behavioral technique.  Baseline: when not drinking much during the day wake up every 2 hours and increase her drinking it is every hour Goal status: ongoing 03/28/22     PLAN:   PT FREQUENCY: 1-2x/week   PT DURATION: 12 weeks   PLANNED INTERVENTIONS: Therapeutic exercises, Therapeutic activity, Neuromuscular re-education, Balance training, Gait training, Patient/Family education, Self Care, Biofeedback, and Manual therapy   PLAN FOR NEXT SESSION: Continue with hip strength. Progress right hip stretching.   All exercises should include pelvic floor contraction. Check on time up and go test  Earlie Counts, PT 04/30/22 2:41 PM

## 2022-05-02 ENCOUNTER — Ambulatory Visit: Payer: Medicare Other

## 2022-05-02 DIAGNOSIS — M6281 Muscle weakness (generalized): Secondary | ICD-10-CM | POA: Diagnosis not present

## 2022-05-02 DIAGNOSIS — R262 Difficulty in walking, not elsewhere classified: Secondary | ICD-10-CM

## 2022-05-02 DIAGNOSIS — R252 Cramp and spasm: Secondary | ICD-10-CM

## 2022-05-02 NOTE — Therapy (Signed)
OUTPATIENT PHYSICAL THERAPY TREATMENT NOTE   Patient Name: [name] MRN: [id number] DOB:31-Mar-1934, 89 y.o., female Today's Date: 05/02/2022  PCP: [name], NP   REFERRING PROVIDER: [name], MD   END OF SESSION:   PT End of Session - 05/02/22 1107     Visit Number 18    Date for PT Re-Evaluation 05/18/22    Authorization Type U[employer]    Progress Note Due on Visit 79    PT Start Time 1104    PT Stop Time 1145    PT Time Calculation (min) 41 min    Activity Tolerance Patient limited by pain    Behavior During Therapy [facility] for tasks assessed/performed             Past Medical History:  Diagnosis Date   Arthritis    Cancer ([place])    left breast cancer    Cataracts, bilateral    removed by surgery   CHF (congestive heart failure) ([place])    PACEMAKER & DEFIB   Complication of anesthesia    hypotensive after back surgery in 2006   Depression    Dyslipidemia    Fainted 04/21/06   AT [facility]   GERD (gastroesophageal reflux disease)    Headache(784.0)    Hearing loss    bilateral hearing aids   HLD (hyperlipidemia)    diet controlled    Hypertension    Hypothyroidism    ICD (implantable cardiac defibrillator) in place    pt has pacer/icd   ICD (implantable cardiac defibrillator), biventricular, in situ    LBBB (left bundle branch block)    Memory loss    Nonischemic cardiomyopathy ([place])    Normal coronary arteries    s/p cardiac cath 2007   Pacemaker    ICD [employer]   Syncope    Systolic CHF [facility])    Vertigo    Wears glasses    Past Surgical History:  Procedure Laterality Date   BACK SURGERY     lumbar fusion    BREAST LUMPECTOMY Left 2008   BREAST SURGERY  2000   LUMP REMOVAL. STAGE 1 CANCER   CARDIAC CATHETERIZATION     CATARACT EXTRACTION     COLONOSCOPY     EYE SURGERY     IMPLANTABLE CARDIOVERTER DEFIBRILLATOR GENERATOR CHANGE N/A 12/18/2012   Procedure: IMPLANTABLE CARDIOVERTER DEFIBRILLATOR GENERATOR CHANGE;   Surgeon: [name], MD;  Location: [facility] CATH LAB;  Service: Cardiovascular;  Laterality: N/A;   JOINT REPLACEMENT  06/14/01   right   LUMBAR FUSION  2006   MASS EXCISION  11/08/2011   Procedure: EXCISION MASS;  Surgeon: [name], MD;  Location: [facility];  Service: General;  Laterality: Left;  Excision Left Thigh Mass   MASTECTOMY W/ SENTINEL NODE BIOPSY Left 06/04/2019   Procedure: LEFT MASTECTOMY WITH SENTINEL LYMPH NODE BIOPSY;  Surgeon: [name], MD;  Location: [place];  Service: General;  Laterality: Left;   MASTECTOMY, PARTIAL  2008   GOT PACEMAKER AND DEFIB AT THAT TIME   PACEMAKER INSERTION  04/23/06   TOTAL KNEE ARTHROPLASTY  05/17/01   RIGHT KNEE   TOTAL KNEE ARTHROPLASTY Left 11/29/2014   Procedure: TOTAL LEFT KNEE ARTHROPLASTY;  Surgeon: Paralee Cancel, MD;  Location: [facility];  Service: Orthopedics;  Laterality: Left;   Patient Active Problem List   Diagnosis Date Noted   Sciatica associated with disorder of multiple sites of spine 03/20/2021   Bipolar II disorder ([place]) 03/20/2021   Chronic pain  syndrome 03/20/2021   Dementia without behavioral disturbance ([place]) 02/04/2020   Urinary retention 02/04/2020   Seasonal allergies 02/04/2020   Recurrent major depressive disorder, in partial remission ([place]) 07/17/2019   Status post left mastectomy 07/01/2019   Breast cancer of lower-outer quadrant of left female breast ([place]) 06/04/2019   Candida infection, oral 01/15/2019   Senile purpura ([place]) 04/14/2018   Lumbar post-laminectomy syndrome 12/03/2017   Lumbar spondylosis 12/03/2017   History of back surgery 09/01/2017   Constipation 09/01/2017   Hypothyroidism due to acquired atrophy of thyroid 09/01/2017   Mixed hyperlipidemia 09/01/2017   Age-related osteoporosis without current pathological fracture 09/01/2017   High risk medication use 09/01/2017   Arthritis of hand 05/17/2017   Atherosclerosis of native arteries of extremity with intermittent claudication ([place]) 05/15/2017    Status post total bilateral knee replacement 12/20/2016   Gait abnormality 07/16/2016   Chronic low back pain 07/16/2016   Mild cognitive impairment 07/16/2016   Wrist pain 05/10/2015   S/P left TKA 11/29/2014   S/P knee replacement 11/29/2014   Spontaneous bruising 08/27/2014   Numbness and tingling in right hand 07/28/2014   Essential tremor 07/28/2014   Dizziness 05/28/2014   Shaky 05/28/2014   Memory loss 05/28/2014   Depression 05/06/2014   Lipoma of left upper thigh 3x5 cm 09/28/2011   Cerebral vascular accident ([place]) 08/09/2011   Syncope 08/09/2011   History of breast cancer [id number], s/p BCT 2008, triple negative 01/26/2011   Chronic L breast pain with chronic recurrent seroma, s/p excisional biopsy 01/12/2010 01/26/2011   Fainted    ICD (implantable cardioverter-defibrillator), biventricular, in situ 44/96/7591   Chronic systolic heart failure ([place]) 08/02/2010   Essential hypertension 08/02/2010   REFERRING DIAG: N39.45 (ICD-10-CM) - Continuous leakage   THERAPY DIAG:  Muscle weakness (generalized)   Cramp and spasm   Other abnormalities of gait and mobility   Other lack of coordination   Rationale for Evaluation and Treatment: Rehabilitation   ONSET DATE: 6 months ago   SUBJECTIVE:                                                                                                                                                                                            SUBJECTIVE STATEMENT: Patient and dtr report that she is having more hip and leg pain today.  She rates this pain at 7/10.      PAIN: right hip pain level 7/10. Pain happens with movement/weight bearing      PRECAUTIONS: ICD/Pacemaker, left breast cancer   WEIGHT BEARING RESTRICTIONS: No   FALLS:  Has patient fallen in last 6 months? No  LIVING ENVIRONMENT: Lives with: lives with their family     OCCUPATION: retired   PLOF: Independent   PATIENT GOALS: reduce leakage   PERTINENT  HISTORY:  Left breast cancer; CHF; Hypothyroidism; Pacemaker; Lumbar fusion; Left knee arthroplasty     URINATION: Pain with urination: No Fully empty bladder: Yes:  Stream: Strong Urgency: Yes:   Frequency: day time voids 4 night times every 2 hours or 1 hour Leakage:  does not feel the leakage Pads: Yes: 2 pads per day, night wears a depends with toilet paper, 3 times at night   PREGNANCY: Vaginal deliveries 1     PROLAPSE: Cystocele   and Urethrocele      OBJECTIVE: (objective measures completed at initial evaluation unless otherwise dated)   DIAGNOSTIC FINDINGS:  Pessary fittings     COGNITION: Overall cognitive status: Within functional limits for tasks assessed                          SENSATION: Light touch: Appears intact Proprioception: Appears intact     FUNCTIONAL TESTS:  5 times sit to stand: 20 sec; 03/19/2022 13 sec but felt urinary leakage while doing the task.  Timed up and go (TUG): 15 sec 03/09/2022: 16 sec, 13 sec, 11 sec   04/18/22: TUG: 14.78 sec 5 times sit to stand: 15.00 sec   GAIT: Assistive device utilized: Single point cane, when she is going out Level of assistance: Complete Independence Comments: small steps and slowly    POSTURE: No Significant postural limitations   PELVIC ALIGNMENT:     LOWER EXTREMITY ROM:   Passive ROM Right eval Left eval  Hip external rotation 25 45   (Blank rows = not tested)   LOWER EXTREMITY MMT:   MMT Right eval Left eval Right 1/3/224 Left 04/11/22  Hip extension 3/5 3/5 4-/5 4-/5  Hip abduction 3/5 3/5 4-/5 4-/5  Hip adduction 3/5 3/5 5/5 5/5    PALPATION:    External Perineal Exam some redness in the vulva area                             Internal Pelvic Floor tightness in the introitus. She would contract her gluteal more than the vaginal but after tactile cues was able to isolate the vaginal canal.    Patient confirms identification and approves PT to assess internal pelvic floor and  treatment Yes   PELVIC MMT:   MMT eval 03/09/2022 03/28/22 04/23/22  Vaginal 1/5 2/5 3/5 with weak hug 3/5        TONE: Low tone   PROLAPSE: Had pessary in   TODAY'S TREATMENT:   05/02/2022 Exercises: Nustep x 5 min level 4 (PT present to discuss status) Supine hamstring stretch (right) 3 x 30 sec Supine IT band stretch (right) 3 x 30 sec Piriformis stretch in supine (patient needed assist to obtain position 3 x 30 sec (right) Seated piriformis/hip ER stretch using strap 3 x 20 sec (right) Pelvic floor contraction with marching x 20 Pelvic floor contraction with lower trunk rotation x 20 Pelvic tilt with focus on pelvic floor contraction x 20 Pelvic tilt with focus on pelvic floor contraction with SLR 2 x 10 each LE Pelvic floor contraction with butterfly stretch 5 times with 10 sec hold Pelvic floor contraction with combo hip IR/ER stretch 5 times each side with 10 sec hold    04/30/22 Exercises: Strengthening: all  exercises patient contracts her pelvic floor Nustep x 5 min level 5 (PT present to discuss status) Sit to stand x 10 holding red plyo ball with pelvic floor contraction Seated holding red plyoball and lean back with pelvic floor contraction and engaging the abdominals Lower trunk rotation maintaining pelvic floor contraction x 20 Bridging x 10 with ball squeeze and hold red plyoball at shoulder height Supine with foot on ball and perform heel slides 10 each side with pelvic floor contraction Supine with ball squeeze moving the red ball up and down with pelvic floor contraction.  Step over orange crossbar and step back to work on picking up her feet and take bigger steps    04/25/2022 Exercises: Nustep x 5 min level 5 (PT present to discuss status) Strengthening: (all exercises, patient is instructed to focus on pelvic floor contraction) Seated hip adduction with pink ball 2 x 10 with pelvic floor contraction Sitting press ball into legs holding for 5 sec 2 sets of  10 with pelvic floor contraction Seated clam 2 x 10 blue loop with pelvic floor contraction Mini sit ups with red plyo ball 2 x 10 with pelvic floor contraction Seated modified russian twist x 10 each side with red plyo ball with pelvic floor contraction Sit to stand x 10 holding red plyo ball with pelvic floor contraction Hook lying butterfly stretch with self manual overpressure 5 times holding 10 sec each both Hip adductor stretch with strap 5 times hold 5 seconds right IT band stretch right x 5 holding 10 sec each Right hamstring stretch x 5 holding 10 sec each Bridging x 10 Lower trunk rotation maintaining pelvic floor contraction x 20 Educated patient again on importance of fluid intake and possibly discussing with MD the issue of being up all night when she takes in more fluid.   04/23/22 Manual: Internal pelvic floor techniques: No emotional/communication barriers or cognitive limitation. Patient is motivated to learn. Patient understands and agrees with treatment goals and plan. PT explains patient will be examined in standing, sitting, and lying down to see how their muscles and joints work. When they are ready, they will be asked to remove their underwear so PT can examine their perineum. The patient is also given the option of providing their own chaperone as one is not provided in our facility. The patient also has the right and is explained the right to defer or refuse any part of the evaluation or treatment including the internal exam. With the patient's consent, PT will use one gloved finger to gently assess the muscles of the pelvic floor, seeing how well it contracts and relaxes and if there is muscle symmetry. After, the patient will get dressed and PT and patient will discuss exam findings and plan of care. PT and patient discuss plan of care, schedule, attendance policy and HEP activities.  Placed index finger into the vaginal canal to assess strength Neuromuscular  re-education: Pelvic floor contraction training: Pelvic floor EMG with surface electrodes on the perineal area working in supine and standing pelvic floor contractions quickly and holding 5 sec.  Pelvic floor contraction with marching and mini squats.  Down training: Educated patient on how to deter the urge to urinate during the night so she is not going to the bathroom every hour.  Exercises: Strengthening: Nustep x 7 min level 5 (PT present to discuss status)          PATIENT EDUCATION:  03/28/2022 Education details: Access Code: [email or web address] Person educated: Patient Education  method: Explanation, Demonstration, Tactile cues, Verbal cues, and Handouts Education comprehension: verbalized understanding, returned demonstration, verbal cues required, tactile cues required, and needs further education   HOME EXERCISE PROGRAM: 03/28/22  Access Code: [email or web address] URL: [email or web address] Date: 03/28/2022 Prepared by: Earlie Counts   Exercises - Supine Pelvic Floor Contraction  - 3 x daily - 7 x weekly - 1 sets - 10 reps - 5 sec hold - Supine Piriformis Stretch with Foot on Ground  - 1 x daily - 7 x weekly - 2 reps - 20 hold - Supine Bridge with Pelvic Floor Contraction  - 1 x daily - 7 x weekly - 3 sets - 10 reps - Seated Long Arc Quad  - 1 x daily - 7 x weekly - 2 sets - 10 reps - Seated March  - 1 x daily - 7 x weekly - 2 sets - 10 reps - Seated Abdominal Press into [employer]  - 1 x daily - 7 x weekly - 1 sets - 10 reps - Supine Shoulder Horizontal Abduction with Resistance  - 1 x daily - 3 x weekly - 1 sets - 10 reps - Supine Shoulder Flexion Extension AAROM with Dowel  - 1 x daily - 3 x weekly - 1 sets - 10 reps - Seated Pelvic Floor Contraction  - 3 x daily - 7 x weekly - 1 sets - 10 reps - 5 sec hold     ASSESSMENT:   CLINICAL IMPRESSION: Patient has been having significant increase in left hip pain which is impeding her ability to move quick enough to get to the  bathroom timely.  She is continuing to be able to make it to the bathroom 85% of the time without leakage but still having some wet pads at times.  We added hip stretches today to assist with hip pain in hopes that this will not continue to interfere with her goals for her pelvic floor progress.   Patient will benefit from skilled therapy to improve balance, strength and pelvic floor coordination to reduce leakage and walk safely to the bathroom.    OBJECTIVE IMPAIRMENTS: decreased activity tolerance, decreased coordination, decreased endurance, decreased mobility, difficulty walking, decreased strength, and increased fascial restrictions.    ACTIVITY LIMITATIONS: standing, transfers, continence, toileting, and locomotion level   PARTICIPATION LIMITATIONS: shopping and community activity   PERSONAL FACTORS: Age, Fitness, Past/current experiences, and 1-2 comorbidities: Left breast cancer; CHF; Hypothyroidism; Pacemaker; Lumbar fusion; Left knee arthroplasty  are also affecting patient's functional outcome.    REHAB POTENTIAL: Good   CLINICAL DECISION MAKING: Evolving/moderate complexity   EVALUATION COMPLEXITY: Moderate     GOALS: Goals reviewed with patient? Yes   SHORT TERM GOALS: Target date: 03/23/2022   Patient independent with initial HEP for pelvic floor contraction.  Baseline: Goal status: Goal met 03/05/22   2.  Patient is able to feel the urinary leakage due to improve pelvic floor tone.  Baseline:  Goal status: Met 03/19/2022   3.  Patient educated on the urge to void to be able to walk to the bathroom slowl Baseline:  Goal status: Met 04/11/22   4.  Timed up and go </= 14 sec Baseline:  Goal status: Met 03/09/2022   5.  Sit to stand </- 18 sec  Baseline:  Goal status: Met 03/19/2020     LONG TERM GOALS: Target date: 05/18/2021   Patient independent with advanced hep for pelvic floor strength and balance exercises.  Baseline:  Goal status: ongoing 03/28/22  2.   Patient reports her urinary leakage is </= 50% due to improved pelvic floor strength >/ 3/5 Baseline: Patient reports her pads are 30% dryer.  Goal status: ongoing 03/28/22   3.  Time up and go test is </= 13 sec. So she is able to walk to the commode safely when she has the urge to urinate.  Baseline:  Goal status: INITIAL   4.  Sit to stand score is </= 15 sec so she is able to safely get out of the chair or get up from the commode.  Baseline:  Goal status: Met 04/23/22   5.  Patient is able to wake up 2 times at night instead of every hour due to increased in pelvic floor strength and understanding on how to use the urge to void behavioral technique.  Baseline: when not drinking much during the day wake up every 2 hours and increase her drinking it is every hour Goal status: ongoing 03/28/22     PLAN:   PT FREQUENCY: 1-2x/week   PT DURATION: 12 weeks   PLANNED INTERVENTIONS: Therapeutic exercises, Therapeutic activity, Neuromuscular re-education, Balance training, Gait training, Patient/Family education, Self Care, Biofeedback, and Manual therapy   PLAN FOR NEXT SESSION: Continue with hip strength. Progress right hip stretching.   All exercises should include pelvic floor contraction.   Anderson Malta B. Liliann File, PT 05/02/22 11:43 AM  [facility] [address] [place], [place] [address] Phone # [phone] Fax [phone]

## 2022-05-03 ENCOUNTER — Ambulatory Visit: Payer: Medicare Other | Attending: Internal Medicine

## 2022-05-03 DIAGNOSIS — I428 Other cardiomyopathies: Secondary | ICD-10-CM | POA: Diagnosis not present

## 2022-05-03 LAB — CUP PACEART REMOTE DEVICE CHECK
Battery Remaining Longevity: 12 mo
Battery Remaining Percentage: 23 %
Brady Statistic RA Percent Paced: 0 %
Brady Statistic RV Percent Paced: 100 %
Date Time Interrogation Session: 20240125041200
HighPow Impedance: 49 Ohm
Implantable Lead Connection Status: 753985
Implantable Lead Connection Status: 753985
Implantable Lead Connection Status: 753985
Implantable Lead Implant Date: 20080116
Implantable Lead Implant Date: 20080116
Implantable Lead Implant Date: 20080116
Implantable Lead Location: 753859
Implantable Lead Location: 753860
Implantable Lead Location: 753860
Implantable Lead Model: 157
Implantable Lead Model: 4469
Implantable Lead Model: 4555
Implantable Lead Serial Number: 136532
Implantable Lead Serial Number: 161542
Implantable Lead Serial Number: 473495
Implantable Pulse Generator Implant Date: 20140911
Lead Channel Impedance Value: 405 Ohm
Lead Channel Impedance Value: 473 Ohm
Lead Channel Impedance Value: 815 Ohm
Lead Channel Pacing Threshold Amplitude: 0.6 V
Lead Channel Pacing Threshold Amplitude: 0.7 V
Lead Channel Pacing Threshold Amplitude: 1 V
Lead Channel Pacing Threshold Pulse Width: 0.4 ms
Lead Channel Pacing Threshold Pulse Width: 0.4 ms
Lead Channel Pacing Threshold Pulse Width: 0.8 ms
Lead Channel Setting Pacing Amplitude: 2 V
Lead Channel Setting Pacing Amplitude: 2 V
Lead Channel Setting Pacing Amplitude: 2.2 V
Lead Channel Setting Pacing Pulse Width: 0.4 ms
Lead Channel Setting Pacing Pulse Width: 0.8 ms
Lead Channel Setting Sensing Sensitivity: 0.6 mV
Lead Channel Setting Sensing Sensitivity: 1 mV
Pulse Gen Serial Number: 111765

## 2022-05-04 ENCOUNTER — Encounter: Payer: Self-pay | Admitting: Family

## 2022-05-04 ENCOUNTER — Ambulatory Visit (INDEPENDENT_AMBULATORY_CARE_PROVIDER_SITE_OTHER): Payer: Medicare Other | Admitting: Family

## 2022-05-04 VITALS — BP 116/70 | HR 89 | Temp 97.5°F | Resp 16 | Ht <= 58 in | Wt 114.4 lb

## 2022-05-04 DIAGNOSIS — R350 Frequency of micturition: Secondary | ICD-10-CM | POA: Diagnosis not present

## 2022-05-04 DIAGNOSIS — R300 Dysuria: Secondary | ICD-10-CM | POA: Diagnosis not present

## 2022-05-04 DIAGNOSIS — M549 Dorsalgia, unspecified: Secondary | ICD-10-CM | POA: Diagnosis not present

## 2022-05-04 LAB — POCT URINALYSIS DIPSTICK
Bilirubin, UA: NEGATIVE
Glucose, UA: NEGATIVE
Ketones, UA: POSITIVE
Nitrite, UA: POSITIVE
Protein, UA: POSITIVE — AB
Spec Grav, UA: 1.015 (ref 1.010–1.025)
Urobilinogen, UA: 0.2 E.U./dL
pH, UA: 5 (ref 5.0–8.0)

## 2022-05-04 MED ORDER — CIPROFLOXACIN HCL 500 MG PO TABS: 500.0000 mg | ORAL_TABLET | Freq: Two times a day (BID) | ORAL | 0 refills | Status: AC

## 2022-05-04 MED ORDER — SACCHAROMYCES BOULARDII 250 MG PO CAPS: 250.0000 mg | ORAL_CAPSULE | Freq: Two times a day (BID) | ORAL | 0 refills | Status: AC

## 2022-05-04 NOTE — Progress Notes (Unsigned)
Provider: Meggie Laseter FNP-C  [name], NP  Patient Care Team: [name], NP as PCP - General (Geriatric Medicine) [name], MD as PCP - Electrophysiology (Cardiology) [name], MD as Consulting Physician (General Surgery) Paralee Cancel, MD as Consulting Physician (Orthopedic Surgery) Marica Otter, [place] (Optometry) Marcial Pacas, MD as Consulting Physician (Neurology) [name], MD as Consulting Physician (Cardiology) Ricard Dillon, MD (Psychiatry)  Extended Emergency Contact Information Primary Emergency Contact: Juel Burrow Address: [address]          [place], [place] [address] Johnnette Litter of [place] Phone: [phone] Relation: Daughter Secondary Emergency Contact: [facility] Address: [address]          [place], [place] [address] Johnnette Litter of [place] Phone: [phone] Relation: Friend  Code Status:  DNR Goals of care: Advanced Directive information    05/04/2022   10:36 AM  Advanced Directives  Does Patient Have a Medical Advance Directive? Yes  Type of Paramedic of [place];Living will;Out of facility DNR (pink MOST or yellow form)  Does patient want to make changes to medical advance directive? No - Patient declined  Copy of [place] in Chart? Yes - validated most recent copy scanned in chart (See row information)     Chief Complaint  Patient presents with   Acute Visit    Patient complains of possible kidney infection.Symptoms are urinary frequency, back pain, and dysuria.     HPI:  Pt is a 89 y.o. female seen today for an acute visit for evaluation of urine frequency,dysuria and lower back pain x 3 days.she denies any fever,chills,nausea,vomiting,abdominal pain,flank pain,urgency,frequency,dysuria,difficult urination or hematuria,    Past Medical History:  Diagnosis Date   Arthritis    Cancer ([place])    left breast cancer    Cataracts,  bilateral    removed by surgery   CHF (congestive heart failure) ([place])    PACEMAKER & DEFIB   Complication of anesthesia    hypotensive after back surgery in 2006   Depression    Dyslipidemia    Fainted 04/21/06   AT [facility]   GERD (gastroesophageal reflux disease)    Headache(784.0)    Hearing loss    bilateral hearing aids   HLD (hyperlipidemia)    diet controlled    Hypertension    Hypothyroidism    ICD (implantable cardiac defibrillator) in place    pt has pacer/icd   ICD (implantable cardiac defibrillator), biventricular, in situ    LBBB (left bundle branch block)    Memory loss    Nonischemic cardiomyopathy ([place])    Normal coronary arteries    s/p cardiac cath 2007   Pacemaker    ICD [employer]   Syncope    Systolic CHF [facility])    Vertigo    Wears glasses    Past Surgical History:  Procedure Laterality Date   BACK SURGERY     lumbar fusion    BREAST LUMPECTOMY Left 2008   BREAST SURGERY  2000   LUMP REMOVAL. STAGE 1 CANCER   CARDIAC CATHETERIZATION     CATARACT EXTRACTION     COLONOSCOPY     EYE SURGERY     IMPLANTABLE CARDIOVERTER DEFIBRILLATOR GENERATOR CHANGE N/A 12/18/2012   Procedure: IMPLANTABLE CARDIOVERTER DEFIBRILLATOR GENERATOR CHANGE;  Surgeon: [name], MD;  Location: [facility] CATH LAB;  Service: Cardiovascular;  Laterality: N/A;   JOINT REPLACEMENT  06/14/01   right   LUMBAR FUSION  2006   MASS EXCISION  11/08/2011   Procedure: EXCISION MASS;  Surgeon: [name], MD;  Location: [facility] ORS;  Service: General;  Laterality: Left;  Excision Left Thigh Mass   MASTECTOMY W/ SENTINEL NODE BIOPSY Left 06/04/2019   Procedure: LEFT MASTECTOMY WITH SENTINEL LYMPH NODE BIOPSY;  Surgeon: [name], MD;  Location: [place];  Service: General;  Laterality: Left;   MASTECTOMY, PARTIAL  2008   GOT PACEMAKER AND DEFIB AT THAT TIME   PACEMAKER INSERTION  04/23/06   TOTAL KNEE ARTHROPLASTY  05/17/01   RIGHT KNEE   TOTAL KNEE ARTHROPLASTY Left 11/29/2014   Procedure:  TOTAL LEFT KNEE ARTHROPLASTY;  Surgeon: Paralee Cancel, MD;  Location: [facility] ORS;  Service: Orthopedics;  Laterality: Left;    Allergies  Allergen Reactions   Iodine Shortness Of Breath    Iodine contrast, CHF , SOB   Shellfish Allergy Shortness Of Breath   Memantine     Malaise, fogginess/ couldn't think   Aspirin Nausea And Vomiting   Codeine Nausea And Vomiting    Outpatient Encounter Medications as of 05/04/2022  Medication Sig   acetaminophen (TYLENOL) 500 MG tablet Take 500 mg by mouth 2 (two) times daily.  in the morning and afternoon   acetaminophen (TYLENOL) 650 MG CR tablet Take 650 mg by mouth at bedtime.   albuterol (VENTOLIN HFA) 108 (90 Base) MCG/ACT inhaler Inhale 2 puffs into the lungs every 6 (six) hours as needed for wheezing or shortness of breath.   ascorbic acid (VITAMIN C) 500 MG tablet Take 500 mg by mouth daily.   buPROPion (WELLBUTRIN XL) 150 MG 24 hr tablet Take 1 tablet (150 mg total) by mouth daily.   carvedilol (COREG) 3.125 MG tablet TAKE 1 TABLET BY MOUTH TWICE  DAILY   Cholecalciferol (VITAMIN D3) 50 MCG (2000 UT) TABS Take 2,000 Units by mouth daily.    diclofenac Sodium (VOLTAREN) 1 % GEL Apply 4 g topically 4 (four) times daily.   DULoxetine (CYMBALTA) 60 MG capsule Take 60 mg by mouth daily.   estradiol (ESTRACE) 0.1 MG/GM vaginal cream APPLY A SMALL AT INTROITUS DAILY FOR TWO WEEKS, THEN THREE TIMES A WEEK FOR TWO WEEKS THEN TWICE A WEEK FOR TWO WEEKS   famotidine (PEPCID) 10 MG tablet Take 10 mg by mouth as needed for heartburn or indigestion.   fluticasone (FLONASE) 50 MCG/ACT nasal spray Place 2 sprays into both nostrils daily.   furosemide (LASIX) 20 MG tablet Take 1 tablet (20 mg total) by mouth daily as needed.   levothyroxine (SYNTHROID, LEVOTHROID) 100 MCG tablet Take 1 tablet (100 mcg total) by mouth daily before breakfast.   Lidocaine 4 % PTCH Apply 1 patch topically every 12 (twelve) hours. Apply to back   lubiprostone (AMITIZA) 8 MCG capsule  TAKE ONE CAPSULE BY MOUTH TWICE DAILY WITH A MEAL   Magnesium 250 MG TABS Take 250 mg by mouth daily.   mirabegron ER (MYRBETRIQ) 50 MG TB24 tablet Take 1 tablet (50 mg total) by mouth daily.   NALTREXONE HCL PO Take 4 mg by mouth at bedtime.   NONFORMULARY OR COMPOUNDED ITEM Apply 120 Tubes topically daily. Diclofenac/Cyclobenzaprine/lamotrigine/lidocaine/prilocaine (10480) 2%/2%/6%/5%/1.25% cream QTY: 120 GM SIG: (NEURO) APPLY 1-2 PUMPS (1-2 GMS) TO AFFECTED AREA (S) OR FOCAL POINTS 3 TO 4 TIMES DAILY. RUB IN FOR 2 MINUTES TO ACHIEVE MAX PENETRATION. [place] HANDS WELL.   Omega-3 Fatty Acids (FISH OIL PO) Take 1 capsule by mouth daily.   omeprazole (PRILOSEC) 40 MG capsule  TAKE 1 CAPSULE BY MOUTH DAILY   polyethylene glycol powder (GLYCOLAX/MIRALAX) 17 GM/SCOOP powder Take 1 Container by mouth daily.   pregabalin (LYRICA) 25 MG capsule Take 2 capsules (50 mg total) by mouth at bedtime. For nerve pain/back pain   senna-docusate (SENOKOT S) 8.6-50 MG tablet Take 2 tablets by mouth daily.   solifenacin (VESICARE) 5 MG tablet Take 1 tablet by mouth daily.   spironolactone (ALDACTONE) 25 MG tablet TAKE 1 TABLET BY MOUTH IN THE  MORNING   telmisartan (MICARDIS) 20 MG tablet Take 1 tablet (20 mg total) by mouth daily.   Vitamin E 400 units TABS Take 400 Units by mouth daily.    [DISCONTINUED] NALTREXONE HCL PO Take 1 capsule by mouth at bedtime.   [DISCONTINUED] sodium phosphate (FLEET) 7-19 GM/118ML ENEM Place 133 mLs (1 enema total) rectally daily as needed for severe constipation.   No facility-administered encounter medications on file as of 05/04/2022.    Review of Systems  Immunization History  Administered Date(s) Administered   Fluad Quad(high Dose 65+) 01/28/2020, 01/17/2021   Influenza, High Dose Seasonal PF 01/16/2018, 12/08/2018, 01/12/2022   Influenza-Unspecified 12/08/2013, 01/10/2015, 12/09/2015, 01/12/2017, 01/17/2021   PFIZER(Purple Top)SARS-COV-2 Vaccination 05/23/2019,  06/15/2019, 12/23/2019, 08/30/2020   PNEUMOCOCCAL CONJUGATE-20 01/05/2022   Pfizer Covid-19 Vaccine Bivalent Booster 5yr & up 02/03/2021, 01/12/2022   Pneumococcal Conjugate-13 04/09/2012   Pneumococcal Polysaccharide-23 02/28/2016   Respiratory Syncytial Virus Vaccine,Recomb Aduvanted(Arexvy) 01/05/2022   Tdap 01/07/2013   Zoster Recombinat (Shingrix) 02/03/2021, 06/26/2021   Zoster, Live 08/05/2014, 02/03/2021   Pertinent  Health Maintenance Due  Topic Date Due   MAMMOGRAM  03/28/2022   INFLUENZA VACCINE  Completed   DEXA SCAN  Completed      01/17/2022   10:44 AM 01/22/2022   11:25 AM 01/24/2022    1:05 PM 02/23/2022    2:53 PM 05/04/2022   10:36 AM  Fall Risk  Falls in the past year? 0 0 0 0 0  Was there an injury with Fall? 0  0 0 0  Fall Risk Category Calculator 0  0 0 0  Fall Risk Category (Retired) Low  Low Low   (RETIRED) Patient Fall Risk Level Low fall risk  Low fall risk Low fall risk   Patient at Risk for Falls Due to No Fall Risks   No Fall Risks No Fall Risks  Fall risk Follow up Falls evaluation completed  Falls evaluation completed Falls evaluation completed Falls evaluation completed   Functional Status Survey:    Vitals:   05/04/22 1033  BP: 116/70  Pulse: 89  Resp: 16  Temp: (!) 97.5 F (36.4 C)  SpO2: 95%  Weight: 114 lb 6.4 oz (51.9 kg)  Height: '4\' 10"'$  (13.748m)   Body mass index is 23.91 kg/m. Physical Exam  Labs reviewed: Recent Labs    10/23/21 1528 12/04/21 1016 12/22/21 0912  NA 132* 134* 135  K 4.3 4.7 4.7  CL 97* 99 101  CO2 '27 27 28  '$ GLUCOSE 116 57* 73  BUN '21 19 19  '$ CREATININE 0.81 0.77 0.73  CALCIUM 10.9* 10.3 10.1   Recent Labs    06/20/21 0945 07/07/21 0951 12/04/21 1016 12/22/21 0912  AST '22 29 22 29  '$ ALT '18 18 20 23  '$ ALKPHOS 60  --   --  69  BILITOT 0.4 0.4 0.3 0.4  PROT 7.0 7.2 6.9 6.5  ALBUMIN 4.0  --   --  3.6   Recent Labs    12/04/21 1016  12/22/21 0912 01/17/22 1116  WBC 5.3 5.9 7.1   NEUTROABS 3,466 3.7 5,098  HGB 12.0 11.8* 12.3  HCT 36.4 35.2* 38.0  MCV 90.1 88.7 90.7  PLT 144 151 144   Lab Results  Component Value Date   TSH 0.81 06/02/2021   No results found for: "HGBA1C" Lab Results  Component Value Date   CHOL 171 11/17/2020   HDL 63 11/17/2020   LDLCALC 90 11/17/2020   TRIG 86 11/17/2020   CHOLHDL 2.7 11/17/2020    Significant Diagnostic Results in last 30 days:  CUP PACEART REMOTE DEVICE CHECK  Result Date: 05/03/2022 Scheduled remote reviewed. Normal device function.  Ther ewere two short NSVT arrhythmias detected that appear to be supraventricular in origin Next remote 91 days. Kathy Breach, RN, CCDS, [employer]   Assessment/Plan 1. Dysuria *** - POC Urinalysis Dipstick - Urine Culture  2. Back pain, unspecified back location, unspecified back pain laterality, unspecified chronicity *** - POC Urinalysis Dipstick - Urine Culture  3. Urinary frequency *** - POC Urinalysis Dipstick - Urine Culture    Family/ staff Communication: Reviewed plan of care with patient  Labs/tests ordered: None   Next Appointment:   Sandrea Hughs, NP

## 2022-05-06 LAB — URINE CULTURE
MICRO NUMBER:: 14480099
SPECIMEN QUALITY:: ADEQUATE

## 2022-05-07 ENCOUNTER — Ambulatory Visit (HOSPITAL_COMMUNITY)
Admission: RE | Admit: 2022-05-07 | Discharge: 2022-05-07 | Disposition: A | Discharge status: Home / Self Care | Payer: Medicare Other | Source: Ambulatory Visit | Attending: Urology | Admitting: Urology

## 2022-05-07 ENCOUNTER — Ambulatory Visit: Payer: Medicare Other | Admitting: Neurology

## 2022-05-07 DIAGNOSIS — N281 Cyst of kidney, acquired: Secondary | ICD-10-CM | POA: Insufficient documentation

## 2022-05-08 ENCOUNTER — Ambulatory Visit: Payer: Medicare Other

## 2022-05-08 DIAGNOSIS — R252 Cramp and spasm: Secondary | ICD-10-CM

## 2022-05-08 DIAGNOSIS — M6281 Muscle weakness (generalized): Secondary | ICD-10-CM

## 2022-05-08 DIAGNOSIS — R293 Abnormal posture: Secondary | ICD-10-CM

## 2022-05-08 NOTE — Therapy (Signed)
OUTPATIENT PHYSICAL THERAPY TREATMENT NOTE   Patient Name: [name] MRN: [id number] DOB:1934/03/15, 89 y.o., female Today's Date: 05/08/2022  PCP: [name], NP   REFERRING PROVIDER: [name], MD   END OF SESSION:   PT End of Session - 05/08/22 1544     Visit Number 110    Date for PT Re-Evaluation 05/18/22    Authorization Type [employer]    Progress Note Due on Visit 69    PT Start Time 1530    PT Stop Time 8850    PT Time Calculation (min) 45 min    Activity Tolerance Patient limited by pain    Behavior During Therapy [facility] for tasks assessed/performed             Past Medical History:  Diagnosis Date   Arthritis    Cancer ([place])    left breast cancer    Cataracts, bilateral    removed by surgery   CHF (congestive heart failure) ([place])    PACEMAKER & DEFIB   Complication of anesthesia    hypotensive after back surgery in 2006   Depression    Dyslipidemia    Fainted 04/21/06   AT [facility]   GERD (gastroesophageal reflux disease)    Headache(784.0)    Hearing loss    bilateral hearing aids   HLD (hyperlipidemia)    diet controlled    Hypertension    Hypothyroidism    ICD (implantable cardiac defibrillator) in place    pt has pacer/icd   ICD (implantable cardiac defibrillator), biventricular, in situ    LBBB (left bundle branch block)    Memory loss    Nonischemic cardiomyopathy ([place])    Normal coronary arteries    s/p cardiac cath 2007   Pacemaker    ICD [employer]   Syncope    Systolic CHF [facility])    Vertigo    Wears glasses    Past Surgical History:  Procedure Laterality Date   BACK SURGERY     lumbar fusion    BREAST LUMPECTOMY Left 2008   BREAST SURGERY  2000   LUMP REMOVAL. STAGE 1 CANCER   CARDIAC CATHETERIZATION     CATARACT EXTRACTION     COLONOSCOPY     EYE SURGERY     IMPLANTABLE CARDIOVERTER DEFIBRILLATOR GENERATOR CHANGE N/A 12/18/2012   Procedure: IMPLANTABLE CARDIOVERTER DEFIBRILLATOR GENERATOR CHANGE;   Surgeon: [name], MD;  Location: [facility] CATH LAB;  Service: Cardiovascular;  Laterality: N/A;   JOINT REPLACEMENT  06/14/01   right   LUMBAR FUSION  2006   MASS EXCISION  11/08/2011   Procedure: EXCISION MASS;  Surgeon: [name], MD;  Location: [facility] ORS;  Service: General;  Laterality: Left;  Excision Left Thigh Mass   MASTECTOMY W/ SENTINEL NODE BIOPSY Left 06/04/2019   Procedure: LEFT MASTECTOMY WITH SENTINEL LYMPH NODE BIOPSY;  Surgeon: [name], MD;  Location: [place];  Service: General;  Laterality: Left;   MASTECTOMY, PARTIAL  2008   GOT PACEMAKER AND DEFIB AT THAT TIME   PACEMAKER INSERTION  04/23/06   TOTAL KNEE ARTHROPLASTY  05/17/01   RIGHT KNEE   TOTAL KNEE ARTHROPLASTY Left 11/29/2014   Procedure: TOTAL LEFT KNEE ARTHROPLASTY;  Surgeon: Paralee Cancel, MD;  Location: [facility];  Service: Orthopedics;  Laterality: Left;   Patient Active Problem List   Diagnosis Date Noted   Sciatica associated with disorder of multiple sites of spine 03/20/2021   Bipolar II disorder ([place]) 03/20/2021   Chronic pain  syndrome 03/20/2021   Dementia without behavioral disturbance ([place]) 02/04/2020   Urinary retention 02/04/2020   Seasonal allergies 02/04/2020   Recurrent major depressive disorder, in partial remission ([place]) 07/17/2019   Status post left mastectomy 07/01/2019   Breast cancer of lower-outer quadrant of left female breast ([place]) 06/04/2019   Candida infection, oral 01/15/2019   Senile purpura ([place]) 04/14/2018   Lumbar post-laminectomy syndrome 12/03/2017   Lumbar spondylosis 12/03/2017   History of back surgery 09/01/2017   Constipation 09/01/2017   Hypothyroidism due to acquired atrophy of thyroid 09/01/2017   Mixed hyperlipidemia 09/01/2017   Age-related osteoporosis without current pathological fracture 09/01/2017   High risk medication use 09/01/2017   Arthritis of hand 05/17/2017   Atherosclerosis of native arteries of extremity with intermittent claudication ([place]) 05/15/2017    Status post total bilateral knee replacement 12/20/2016   Gait abnormality 07/16/2016   Chronic low back pain 07/16/2016   Mild cognitive impairment 07/16/2016   Wrist pain 05/10/2015   S/P left TKA 11/29/2014   S/P knee replacement 11/29/2014   Spontaneous bruising 08/27/2014   Numbness and tingling in right hand 07/28/2014   Essential tremor 07/28/2014   Dizziness 05/28/2014   Shaky 05/28/2014   Memory loss 05/28/2014   Depression 05/06/2014   Lipoma of left upper thigh 3x5 cm 09/28/2011   Cerebral vascular accident ([place]) 08/09/2011   Syncope 08/09/2011   History of breast cancer [id number], s/p BCT 2008, triple negative 01/26/2011   Chronic L breast pain with chronic recurrent seroma, s/p excisional biopsy 01/12/2010 01/26/2011   Fainted    ICD (implantable cardioverter-defibrillator), biventricular, in situ 47/82/9562   Chronic systolic heart failure ([place]) 08/02/2010   Essential hypertension 08/02/2010   REFERRING DIAG: N39.45 (ICD-10-CM) - Continuous leakage   THERAPY DIAG:  Muscle weakness (generalized)   Cramp and spasm   Other abnormalities of gait and mobility   Other lack of coordination   Rationale for Evaluation and Treatment: Rehabilitation   ONSET DATE: 6 months ago   SUBJECTIVE:                                                                                                                                                                                            SUBJECTIVE STATEMENT: Patient states hip pain is still there.  Not much change per patient.      PAIN: right hip pain level 7/10. Pain happens with movement/weight bearing      PRECAUTIONS: ICD/Pacemaker, left breast cancer   WEIGHT BEARING RESTRICTIONS: No   FALLS:  Has patient fallen in last 6 months? No   LIVING ENVIRONMENT: Lives with: lives with their  family     OCCUPATION: retired   PLOF: Independent   PATIENT GOALS: reduce leakage   PERTINENT HISTORY:  Left breast cancer; CHF;  Hypothyroidism; Pacemaker; Lumbar fusion; Left knee arthroplasty     URINATION: Pain with urination: No Fully empty bladder: Yes:  Stream: Strong Urgency: Yes:   Frequency: day time voids 4 night times every 2 hours or 1 hour Leakage:  does not feel the leakage Pads: Yes: 2 pads per day, night wears a depends with toilet paper, 3 times at night   PREGNANCY: Vaginal deliveries 1     PROLAPSE: Cystocele   and Urethrocele      OBJECTIVE: (objective measures completed at initial evaluation unless otherwise dated)   DIAGNOSTIC FINDINGS:  Pessary fittings     COGNITION: Overall cognitive status: Within functional limits for tasks assessed                          SENSATION: Light touch: Appears intact Proprioception: Appears intact     FUNCTIONAL TESTS:  5 times sit to stand: 20 sec; 03/19/2022 13 sec but felt urinary leakage while doing the task.  Timed up and go (TUG): 15 sec 03/09/2022: 16 sec, 13 sec, 11 sec   04/18/22: TUG: 14.78 sec 5 times sit to stand: 15.00 sec   GAIT: Assistive device utilized: Single point cane, when she is going out Level of assistance: Complete Independence Comments: small steps and slowly    POSTURE: No Significant postural limitations   PELVIC ALIGNMENT:     LOWER EXTREMITY ROM:   Passive ROM Right eval Left eval  Hip external rotation 25 45   (Blank rows = not tested)   LOWER EXTREMITY MMT:   MMT Right eval Left eval Right 1/3/224 Left 04/11/22  Hip extension 3/5 3/5 4-/5 4-/5  Hip abduction 3/5 3/5 4-/5 4-/5  Hip adduction 3/5 3/5 5/5 5/5    PALPATION:    External Perineal Exam some redness in the vulva area                             Internal Pelvic Floor tightness in the introitus. She would contract her gluteal more than the vaginal but after tactile cues was able to isolate the vaginal canal.    Patient confirms identification and approves PT to assess internal pelvic floor and treatment Yes   PELVIC MMT:    MMT eval 03/09/2022 03/28/22 04/23/22  Vaginal 1/5 2/5 3/5 with weak hug 3/5        TONE: Low tone   PROLAPSE: Had pessary in   TODAY'S TREATMENT:   05/08/2022 Exercises: Nustep x 5 min level 4 (PT present to discuss status) Standing in front of mirror : right trunk flexion to stretch left side of trunk due scoliosis x 10 (worked on contracting pelvic floor during stretch) Standing with left hip against wall: left trunk shifting x 10 (worked on contracting pelvic floor during stretch) Seated right trunk flexion over peanut to stretch left side of trunk x 5 hold  sec each Modified piriformis stretch x 5 holding 10 sec each side (used stretch strap to assist with lack of hip mobility) Seated ball press downs with pelvic floor contraction x 10 hold 5 sec each Seated resisted pulls with pink long small resistance loop (singled vs doubled) x 10 Seated clam with pelvic floor contraction x 20 with blue loop ALL EXERCISES PERFORMED TODAY GEARED  TOWARD IMPROVING HIP MOBILITY AND CONTROLLING PELVIC FLOOR TO ASSIST WITH PATIENT'S ABILITY TO GET TO BATHROOM IN A TIMELY MANNER     05/02/2022 Exercises: Nustep x 5 min level 4 (PT present to discuss status) Supine hamstring stretch (right) 3 x 30 sec Supine IT band stretch (right) 3 x 30 sec Piriformis stretch in supine (patient needed assist to obtain position 3 x 30 sec (right) Seated piriformis/hip ER stretch using strap 3 x 20 sec (right) Pelvic floor contraction with marching x 20 Pelvic floor contraction with lower trunk rotation x 20 Pelvic tilt with focus on pelvic floor contraction x 20 Pelvic tilt with focus on pelvic floor contraction with SLR 2 x 10 each LE Pelvic floor contraction with butterfly stretch 5 times with 10 sec hold Pelvic floor contraction with combo hip IR/ER stretch 5 times each side with 10 sec hold    04/30/22 Exercises: Strengthening: all exercises patient contracts her pelvic floor Nustep x 5 min level 5 (PT  present to discuss status) Sit to stand x 10 holding red plyo ball with pelvic floor contraction Seated holding red plyoball and lean back with pelvic floor contraction and engaging the abdominals Lower trunk rotation maintaining pelvic floor contraction x 20 Bridging x 10 with ball squeeze and hold red plyoball at shoulder height Supine with foot on ball and perform heel slides 10 each side with pelvic floor contraction Supine with ball squeeze moving the red ball up and down with pelvic floor contraction.  Step over orange crossbar and step back to work on picking up her feet and take bigger steps        PATIENT EDUCATION:  03/28/2022 Education details: Access Code: [email or web address] Person educated: Patient Education method: Explanation, Demonstration, Tactile cues, Verbal cues, and Handouts Education comprehension: verbalized understanding, returned demonstration, verbal cues required, tactile cues required, and needs further education   HOME EXERCISE PROGRAM: Access Code: [email or web address] URL: [email or web address] Date: 05/08/2022 Prepared by: Candyce Churn  Exercises - Supine Pelvic Floor Contraction  - 3 x daily - 7 x weekly - 1 sets - 10 reps - 5 sec hold - Supine Piriformis Stretch with Foot on Ground  - 1 x daily - 7 x weekly - 2 reps - 20 hold - Supine Bridge with Pelvic Floor Contraction  - 1 x daily - 7 x weekly - 3 sets - 10 reps - Seated Long Arc Quad  - 1 x daily - 7 x weekly - 2 sets - 10 reps - Seated March  - 1 x daily - 7 x weekly - 2 sets - 10 reps - Seated Abdominal Press into [employer]  - 1 x daily - 7 x weekly - 1 sets - 10 reps - Supine Shoulder Horizontal Abduction with Resistance  - 1 x daily - 3 x weekly - 1 sets - 10 reps - Supine Shoulder Flexion Extension AAROM with Dowel  - 1 x daily - 3 x weekly - 1 sets - 10 reps - Seated Pelvic Floor Contraction  - 3 x daily - 7 x weekly - 1 sets - 10 reps - 5 sec hold - Seated Figure 4 Piriformis Stretch  - 1 x  daily - 7 x weekly - 1 sets - 5 reps - 10 sec hold   ASSESSMENT:   CLINICAL IMPRESSION: Patient had a set back with her pelvic floor control due to a UTI. She is still having the left hip pain which slows her down and is also making it  difficult to move quickly on start up.  She is compliant with her HEP.   Patient will benefit from skilled therapy to improve balance, strength and pelvic floor coordination to reduce leakage and walk safely to the bathroom.    OBJECTIVE IMPAIRMENTS: decreased activity tolerance, decreased coordination, decreased endurance, decreased mobility, difficulty walking, decreased strength, and increased fascial restrictions.    ACTIVITY LIMITATIONS: standing, transfers, continence, toileting, and locomotion level   PARTICIPATION LIMITATIONS: shopping and community activity   PERSONAL FACTORS: Age, Fitness, Past/current experiences, and 1-2 comorbidities: Left breast cancer; CHF; Hypothyroidism; Pacemaker; Lumbar fusion; Left knee arthroplasty  are also affecting patient's functional outcome.    REHAB POTENTIAL: Good   CLINICAL DECISION MAKING: Evolving/moderate complexity   EVALUATION COMPLEXITY: Moderate     GOALS: Goals reviewed with patient? Yes   SHORT TERM GOALS: Target date: 03/23/2022   Patient independent with initial HEP for pelvic floor contraction.  Baseline: Goal status: Goal met 03/05/22   2.  Patient is able to feel the urinary leakage due to improve pelvic floor tone.  Baseline:  Goal status: Met 03/19/2022   3.  Patient educated on the urge to void to be able to walk to the bathroom slowl Baseline:  Goal status: Met 04/11/22   4.  Timed up and go </= 14 sec Baseline:  Goal status: Met 03/09/2022   5.  Sit to stand </- 18 sec  Baseline:  Goal status: Met 03/19/2020     LONG TERM GOALS: Target date: 05/18/2021   Patient independent with advanced hep for pelvic floor strength and balance exercises.  Baseline:  Goal status: ongoing  03/28/22   2.  Patient reports her urinary leakage is </= 50% due to improved pelvic floor strength >/ 3/5 Baseline: Patient reports her pads are 30% dryer.  Goal status: ongoing 03/28/22   3.  Time up and go test is </= 13 sec. So she is able to walk to the commode safely when she has the urge to urinate.  Baseline:  Goal status: INITIAL   4.  Sit to stand score is </= 15 sec so she is able to safely get out of the chair or get up from the commode.  Baseline:  Goal status: Met 04/23/22   5.  Patient is able to wake up 2 times at night instead of every hour due to increased in pelvic floor strength and understanding on how to use the urge to void behavioral technique.  Baseline: when not drinking much during the day wake up every 2 hours and increase her drinking it is every hour Goal status: ongoing 03/28/22     PLAN:   PT FREQUENCY: 1-2x/week   PT DURATION: 12 weeks   PLANNED INTERVENTIONS: Therapeutic exercises, Therapeutic activity, Neuromuscular re-education, Balance training, Gait training, Patient/Family education, Self Care, Biofeedback, and Manual therapy   PLAN FOR NEXT SESSION: Continue with hip strength. Progress right hip stretching.   All exercises should include pelvic floor contraction.   Anderson Malta B. Uniqua Kihn, PT 05/08/22 5:12 PM  [facility] [address] [place], [place] [address] Phone # [phone] Fax [phone]

## 2022-05-09 ENCOUNTER — Encounter: Payer: Self-pay | Admitting: Physical Therapy

## 2022-05-09 ENCOUNTER — Ambulatory Visit: Payer: Medicare Other | Admitting: Physical Therapy

## 2022-05-09 DIAGNOSIS — R293 Abnormal posture: Secondary | ICD-10-CM

## 2022-05-09 DIAGNOSIS — R252 Cramp and spasm: Secondary | ICD-10-CM

## 2022-05-09 DIAGNOSIS — R262 Difficulty in walking, not elsewhere classified: Secondary | ICD-10-CM

## 2022-05-09 DIAGNOSIS — M6281 Muscle weakness (generalized): Secondary | ICD-10-CM

## 2022-05-09 NOTE — Therapy (Signed)
OUTPATIENT PHYSICAL THERAPY TREATMENT NOTE   Patient Name: [name] MRN: [id number] DOB:October 11, 1933, 89 y.o., female Today's Date: 05/09/2022  PCP: [name], NP  REFERRING PROVIDER: [name], MD  Progress Note Reporting Period 04/04/22 to 05/09/22  See note below for Objective Data and Assessment of Progress/Goals.     END OF SESSION:   PT End of Session - 05/09/22 1104     Visit Number 20    Date for PT Re-Evaluation 05/18/22    Authorization Type [employer]    Authorization - Visit Number 20    Authorization - Number of Visits 20    PT Start Time 1100    PT Stop Time 1145    PT Time Calculation (min) 45 min    Activity Tolerance Patient tolerated treatment well    Behavior During Therapy WFL for tasks assessed/performed             Past Medical History:  Diagnosis Date   Arthritis    Cancer ([place])    left breast cancer    Cataracts, bilateral    removed by surgery   CHF (congestive heart failure) ([place])    PACEMAKER & DEFIB   Complication of anesthesia    hypotensive after back surgery in 2006   Depression    Dyslipidemia    Fainted 04/21/06   AT [facility]   GERD (gastroesophageal reflux disease)    Headache(784.0)    Hearing loss    bilateral hearing aids   HLD (hyperlipidemia)    diet controlled    Hypertension    Hypothyroidism    ICD (implantable cardiac defibrillator) in place    pt has pacer/icd   ICD (implantable cardiac defibrillator), biventricular, in situ    LBBB (left bundle branch block)    Memory loss    Nonischemic cardiomyopathy ([place])    Normal coronary arteries    s/p cardiac cath 2007   Pacemaker    ICD [employer]   Syncope    Systolic CHF [facility])    Vertigo    Wears glasses    Past Surgical History:  Procedure Laterality Date   BACK SURGERY     lumbar fusion    BREAST LUMPECTOMY Left 2008   BREAST SURGERY  2000   LUMP REMOVAL. STAGE 1 CANCER   CARDIAC CATHETERIZATION     CATARACT EXTRACTION      COLONOSCOPY     EYE SURGERY     IMPLANTABLE CARDIOVERTER DEFIBRILLATOR GENERATOR CHANGE N/A 12/18/2012   Procedure: IMPLANTABLE CARDIOVERTER DEFIBRILLATOR GENERATOR CHANGE;  Surgeon: [name], MD;  Location: [facility] CATH LAB;  Service: Cardiovascular;  Laterality: N/A;   JOINT REPLACEMENT  06/14/01   right   LUMBAR FUSION  2006   MASS EXCISION  11/08/2011   Procedure: EXCISION MASS;  Surgeon: [name], MD;  Location: [facility] ORS;  Service: General;  Laterality: Left;  Excision Left Thigh Mass   MASTECTOMY W/ SENTINEL NODE BIOPSY Left 06/04/2019   Procedure: LEFT MASTECTOMY WITH SENTINEL LYMPH NODE BIOPSY;  Surgeon: [name], MD;  Location: [place];  Service: General;  Laterality: Left;   MASTECTOMY, PARTIAL  2008   GOT PACEMAKER AND DEFIB AT THAT TIME   PACEMAKER INSERTION  04/23/06   TOTAL KNEE ARTHROPLASTY  05/17/01   RIGHT KNEE   TOTAL KNEE ARTHROPLASTY Left 11/29/2014   Procedure: TOTAL LEFT KNEE ARTHROPLASTY;  Surgeon: Paralee Cancel, MD;  Location: [facility] ORS;  Service: Orthopedics;  Laterality: Left;   Patient Active Problem List  Diagnosis Date Noted   Sciatica associated with disorder of multiple sites of spine 03/20/2021   Bipolar II disorder ([place]) 03/20/2021   Chronic pain syndrome 03/20/2021   Dementia without behavioral disturbance ([place]) 02/04/2020   Urinary retention 02/04/2020   Seasonal allergies 02/04/2020   Recurrent major depressive disorder, in partial remission ([place]) 07/17/2019   Status post left mastectomy 07/01/2019   Breast cancer of lower-outer quadrant of left female breast ([place]) 06/04/2019   Candida infection, oral 01/15/2019   Senile purpura ([place]) 04/14/2018   Lumbar post-laminectomy syndrome 12/03/2017   Lumbar spondylosis 12/03/2017   History of back surgery 09/01/2017   Constipation 09/01/2017   Hypothyroidism due to acquired atrophy of thyroid 09/01/2017   Mixed hyperlipidemia 09/01/2017   Age-related osteoporosis without current pathological fracture  09/01/2017   High risk medication use 09/01/2017   Arthritis of hand 05/17/2017   Atherosclerosis of native arteries of extremity with intermittent claudication ([place]) 05/15/2017   Status post total bilateral knee replacement 12/20/2016   Gait abnormality 07/16/2016   Chronic low back pain 07/16/2016   Mild cognitive impairment 07/16/2016   Wrist pain 05/10/2015   S/P left TKA 11/29/2014   S/P knee replacement 11/29/2014   Spontaneous bruising 08/27/2014   Numbness and tingling in right hand 07/28/2014   Essential tremor 07/28/2014   Dizziness 05/28/2014   Shaky 05/28/2014   Memory loss 05/28/2014   Depression 05/06/2014   Lipoma of left upper thigh 3x5 cm 09/28/2011   Cerebral vascular accident ([place]) 08/09/2011   Syncope 08/09/2011   History of breast cancer [id number], s/p BCT 2008, triple negative 01/26/2011   Chronic L breast pain with chronic recurrent seroma, s/p excisional biopsy 01/12/2010 01/26/2011   Fainted    ICD (implantable cardioverter-defibrillator), biventricular, in situ 40/01/2724   Chronic systolic heart failure ([place]) 08/02/2010   Essential hypertension 08/02/2010   REFERRING DIAG: N39.45 (ICD-10-CM) - Continuous leakage   THERAPY DIAG:  Muscle weakness (generalized)   Cramp and spasm   Other abnormalities of gait and mobility   Other lack of coordination   Rationale for Evaluation and Treatment: Rehabilitation   ONSET DATE: 6 months ago   SUBJECTIVE:                                                                                                                                                                                            SUBJECTIVE STATEMENT: I have a UTI. I have some pelvic pain due to the infection.      PAIN: right hip pain level 7/10. Pain happens with movement/weight bearing      PRECAUTIONS: ICD/Pacemaker, left breast cancer  WEIGHT BEARING RESTRICTIONS: No   FALLS:  Has patient fallen in last 6 months? No   LIVING  ENVIRONMENT: Lives with: lives with their family     OCCUPATION: retired   PLOF: Independent   PATIENT GOALS: reduce leakage   PERTINENT HISTORY:  Left breast cancer; CHF; Hypothyroidism; Pacemaker; Lumbar fusion; Left knee arthroplasty     URINATION: Pain with urination: No Fully empty bladder: Yes:  Stream: Strong Urgency: Yes:   Frequency: day time voids 4 night times every 2 hours or 1 hour Leakage:  does not feel the leakage Pads: Yes: 2 pads per day, night wears a depends with toilet paper, 3 times at night   PREGNANCY: Vaginal deliveries 1     PROLAPSE: Cystocele   and Urethrocele      OBJECTIVE: (objective measures completed at initial evaluation unless otherwise dated)   DIAGNOSTIC FINDINGS:  Pessary fittings     COGNITION: Overall cognitive status: Within functional limits for tasks assessed                          SENSATION: Light touch: Appears intact Proprioception: Appears intact     FUNCTIONAL TESTS:  5 times sit to stand: 20 sec; 03/19/2022 13 sec but felt urinary leakage while doing the task.  Timed up and go (TUG): 15 sec 03/09/2022: 16 sec, 13 sec, 11 sec   04/18/22: TUG: 14.78 sec 5 times sit to stand: 15.00 sec  05/09/22 Timed up and go no assistive device 14 sec, 12 sec, 13 sec Sit to stand 5 times: 14 sec, 12 sec, 13 sec   GAIT: Assistive device utilized: Single point cane, when she is going out Level of assistance: Complete Independence Comments: small steps and slowly    POSTURE: No Significant postural limitations   PELVIC ALIGNMENT:     LOWER EXTREMITY ROM:   Passive ROM Right eval Left eval  Hip external rotation 25 45   (Blank rows = not tested)   LOWER EXTREMITY MMT:   MMT Right eval Left eval Right 1/3/224 Left 04/11/22 Right 05/09/22 Left  05/09/22  Hip extension 3/5 3/5 4-/5 4-/5 4/5 4/5  Hip abduction 3/5 3/5 4-/5 4-/5 4-/5 4-/5  Hip adduction 3/5 3/5 5/5 5/5 5/5 5/5    PALPATION:    External Perineal  Exam some redness in the vulva area                             Internal Pelvic Floor tightness in the introitus. She would contract her gluteal more than the vaginal but after tactile cues was able to isolate the vaginal canal.    Not able to assess the pelvic floor due to her having a UTI.   PELVIC MMT:   MMT eval 03/09/2022 03/28/22 04/23/22  Vaginal 1/5 2/5 3/5 with weak hug 3/5        TONE: Low tone   PROLAPSE: Had pessary in   TODAY'S TREATMENT:   05/09/2022 Exercises: Strengthening: Nustep x 6 min level 4 (PT present to discuss status) Seated clam with pelvic floor contraction x 20 with blue loop Sitting bilateral shoulder retraction with red band 2x10 and pelvic floor contraction right trunk flexion to stretch left side of trunk due scoliosis x 10 (worked on contracting pelvic floor during stretch)    05/08/2022 Exercises: Nustep x 5 min level 4 (PT present to discuss status) Standing in front of mirror :  right trunk flexion to stretch left side of trunk due scoliosis x 10 (worked on contracting pelvic floor during stretch) Standing with left hip against wall: left trunk shifting x 10 (worked on contracting pelvic floor during stretch) Seated right trunk flexion over peanut to stretch left side of trunk x 5 hold  sec each Modified piriformis stretch x 5 holding 10 sec each side (used stretch strap to assist with lack of hip mobility) Seated ball press downs with pelvic floor contraction x 10 hold 5 sec each Seated resisted pulls with pink long small resistance loop (singled vs doubled) x 10 Seated clam with pelvic floor contraction x 20 with blue loop ALL EXERCISES PERFORMED TODAY GEARED TOWARD IMPROVING HIP MOBILITY AND CONTROLLING PELVIC FLOOR TO ASSIST WITH PATIENT'S ABILITY TO GET TO BATHROOM IN A TIMELY MANNER      05/02/2022 Exercises: Nustep x 5 min level 4 (PT present to discuss status) Supine hamstring stretch (right) 3 x 30 sec Supine IT band stretch (right) 3  x 30 sec Piriformis stretch in supine (patient needed assist to obtain position 3 x 30 sec (right) Seated piriformis/hip ER stretch using strap 3 x 20 sec (right) Pelvic floor contraction with marching x 20 Pelvic floor contraction with lower trunk rotation x 20 Pelvic tilt with focus on pelvic floor contraction x 20 Pelvic tilt with focus on pelvic floor contraction with SLR 2 x 10 each LE Pelvic floor contraction with butterfly stretch 5 times with 10 sec hold Pelvic floor contraction with combo hip IR/ER stretch 5 times each side with 10 sec hold      PATIENT EDUCATION:  03/28/2022 Education details: Access Code: [email or web address] Person educated: Patient Education method: Consulting civil engineer, Demonstration, Tactile cues, Verbal cues, and Handouts Education comprehension: verbalized understanding, returned demonstration, verbal cues required, tactile cues required, and needs further education   HOME EXERCISE PROGRAM: Access Code: [email or web address] URL: [email or web address] Date: 05/08/2022 Prepared by: Candyce Churn   Exercises - Supine Pelvic Floor Contraction  - 3 x daily - 7 x weekly - 1 sets - 10 reps - 5 sec hold - Supine Piriformis Stretch with Foot on Ground  - 1 x daily - 7 x weekly - 2 reps - 20 hold - Supine Bridge with Pelvic Floor Contraction  - 1 x daily - 7 x weekly - 3 sets - 10 reps - Seated Long Arc Quad  - 1 x daily - 7 x weekly - 2 sets - 10 reps - Seated March  - 1 x daily - 7 x weekly - 2 sets - 10 reps - Seated Abdominal Press into [employer]  - 1 x daily - 7 x weekly - 1 sets - 10 reps - Supine Shoulder Horizontal Abduction with Resistance  - 1 x daily - 3 x weekly - 1 sets - 10 reps - Supine Shoulder Flexion Extension AAROM with Dowel  - 1 x daily - 3 x weekly - 1 sets - 10 reps - Seated Pelvic Floor Contraction  - 3 x daily - 7 x weekly - 1 sets - 10 reps - 5 sec hold - Seated Figure 4 Piriformis Stretch  - 1 x daily - 7 x weekly - 1 sets - 5 reps - 10 sec hold     ASSESSMENT:   CLINICAL IMPRESSION: Patient presently has a UTI so her pelvic floor muscle strength cannot be assessed. She has increased in hip strength. She has improved Timed up and go and sit to stand scores to improve  her balance when walking to the bathroom. Patient continues to get up 3-4 times per night and her leakage improved by 30%. Patient continues to feel the bladder in the vaginal area and this could contribute to her leakage. Patient has a HEP for pelvic floor and balance.    OBJECTIVE IMPAIRMENTS: decreased activity tolerance, decreased coordination, decreased endurance, decreased mobility, difficulty walking, decreased strength, and increased fascial restrictions.    ACTIVITY LIMITATIONS: standing, transfers, continence, toileting, and locomotion level   PARTICIPATION LIMITATIONS: shopping and community activity   PERSONAL FACTORS: Age, Fitness, Past/current experiences, and 1-2 comorbidities: Left breast cancer; CHF; Hypothyroidism; Pacemaker; Lumbar fusion; Left knee arthroplasty  are also affecting patient's functional outcome.    REHAB POTENTIAL: Good   CLINICAL DECISION MAKING: Evolving/moderate complexity   EVALUATION COMPLEXITY: Moderate     GOALS: Goals reviewed with patient? Yes   SHORT TERM GOALS: Target date: 03/23/2022   Patient independent with initial HEP for pelvic floor contraction.  Baseline: Goal status: Goal met 03/05/22   2.  Patient is able to feel the urinary leakage due to improve pelvic floor tone.  Baseline:  Goal status: Met 03/19/2022   3.  Patient educated on the urge to void to be able to walk to the bathroom slowl Baseline:  Goal status: Met 04/11/22   4.  Timed up and go </= 14 sec Baseline:  Goal status: Met 03/09/2022   5.  Sit to stand </- 18 sec  Baseline:  Goal status: Met 03/19/2020     LONG TERM GOALS: Target date: 05/18/2021   Patient independent with advanced hep for pelvic floor strength and balance exercises.   Baseline:  Goal status: Met 05/09/22  2.  Patient reports her urinary leakage is </= 50% due to improved pelvic floor strength >/ 3/5 Baseline: Patient reports her pads are 30% dryer.  Goal status: Partially met 03/28/22   3.  Time up and go test is </= 13 sec. So she is able to walk to the commode safely when she has the urge to urinate.  Baseline:  Goal status: Met 05/09/22   4.  Sit to stand score is </= 15 sec so she is able to safely get out of the chair or get up from the commode.  Baseline:  Goal status: Met 04/23/22   5.  Patient is able to wake up 2 times at night instead of every hour due to increased in pelvic floor strength and understanding on how to use the urge to void behavioral technique.  Baseline: wake up 3-4 times per night.  Goal status: Not met 05/09/22    PLAN:   PLAN ; Discharge patient to HEP. She will be seeing Dr. Wannetta Sender on 05/23/22 to have her prolapse assessed.   Earlie Counts, PT 05/09/22 11:06 AM   PHYSICAL THERAPY DISCHARGE SUMMARY  Visits from Start of Care: 20  Current functional level related to goals / functional outcomes: See above.    Remaining deficits: See above.    Education / Equipment: HEP   Patient agrees to discharge. Patient goals were partially met. Patient is being discharged due to maximized rehab potential.  Thank you for the referral. Earlie Counts, PT 05/09/22 11:42 AM

## 2022-05-14 ENCOUNTER — Encounter: Payer: Medicare Other | Admitting: Physical Therapy

## 2022-05-16 ENCOUNTER — Encounter: Payer: Self-pay | Admitting: Family

## 2022-05-16 ENCOUNTER — Ambulatory Visit (INDEPENDENT_AMBULATORY_CARE_PROVIDER_SITE_OTHER): Payer: Medicare Other | Admitting: Family

## 2022-05-16 VITALS — BP 127/77 | HR 78 | Temp 97.1°F | Resp 18 | Ht <= 58 in | Wt 114.0 lb

## 2022-05-16 DIAGNOSIS — R350 Frequency of micturition: Secondary | ICD-10-CM

## 2022-05-16 LAB — POCT URINALYSIS DIPSTICK
Bilirubin, UA: NEGATIVE
Blood, UA: NEGATIVE
Glucose, UA: NEGATIVE
Ketones, UA: NEGATIVE
Leukocytes, UA: NEGATIVE
Nitrite, UA: NEGATIVE
Protein, UA: NEGATIVE
Spec Grav, UA: 1.015 (ref 1.010–1.025)
Urobilinogen, UA: 0.2 E.U./dL
pH, UA: 6 (ref 5.0–8.0)

## 2022-05-16 NOTE — Progress Notes (Signed)
Provider: Chasitty Hehl FNP-C  [name], NP  Patient Care Team: [name], NP as PCP - General (Geriatric Medicine) [name], MD as PCP - Electrophysiology (Cardiology) [name], MD as Consulting Physician (General Surgery) Paralee Cancel, MD as Consulting Physician (Orthopedic Surgery) Marica Otter, [place] (Optometry) Marcial Pacas, MD as Consulting Physician (Neurology) [name], MD as Consulting Physician (Cardiology) Ricard Dillon, MD (Psychiatry)  Extended Emergency Contact Information Primary Emergency Contact: Juel Burrow Address: [address]          [place], [place] [address] Johnnette Litter of [place] Phone: [phone] Relation: Daughter Secondary Emergency Contact: [facility] Address: [address]          [place], [place] [address] Johnnette Litter of [place] Phone: [phone] Relation: Friend  Code Status:  DNR Goals of care: Advanced Directive information    05/04/2022   10:36 AM  Advanced Directives  Does Patient Have a Medical Advance Directive? Yes  Type of Paramedic of [place];Living will;Out of facility DNR (pink MOST or yellow form)  Does patient want to make changes to medical advance directive? No - Patient declined  Copy of [employer] in Chart? Yes - validated most recent copy scanned in chart (See row information)     Chief Complaint  Patient presents with   Acute Visit    Still having UTI symptoms    Health Maintenance    Needs to discuss Covid vaccine, Mammogram and Medicare Annual Wellness    HPI:  Pt is a 89 y.o. female seen today for an acute visit for evaluation of urine frequency and lower abdominal pain.states recently completed antibiotics for UTI but still has symptoms. Has upcoming appointment with Urologist later this month.  She denies any denies any fever,chills,nausea,vomiting,abdominal pain,flank pain,urgency,dysuria,difficult  urination or hematuria.  Past Medical History:  Diagnosis Date   Arthritis    Cancer ([place])    left breast cancer    Cataracts, bilateral    removed by surgery   CHF (congestive heart failure) ([place])    PACEMAKER & DEFIB   Complication of anesthesia    hypotensive after back surgery in 2006   Depression    Dyslipidemia    Fainted 04/21/06   AT [facility]   GERD (gastroesophageal reflux disease)    Headache(784.0)    Hearing loss    bilateral hearing aids   HLD (hyperlipidemia)    diet controlled    Hypertension    Hypothyroidism    ICD (implantable cardiac defibrillator) in place    pt has pacer/icd   ICD (implantable cardiac defibrillator), biventricular, in situ    LBBB (left bundle branch block)    Memory loss    Nonischemic cardiomyopathy ([place])    Normal coronary arteries    s/p cardiac cath 2007   Pacemaker    ICD [employer]   Syncope    Systolic CHF [facility])    Vertigo    Wears glasses    Past Surgical History:  Procedure Laterality Date   BACK SURGERY     lumbar fusion    BREAST LUMPECTOMY Left 2008   BREAST SURGERY  2000   LUMP REMOVAL. STAGE 1 CANCER   CARDIAC CATHETERIZATION     CATARACT EXTRACTION     COLONOSCOPY     EYE SURGERY     IMPLANTABLE CARDIOVERTER DEFIBRILLATOR GENERATOR CHANGE N/A 12/18/2012   Procedure: IMPLANTABLE CARDIOVERTER DEFIBRILLATOR GENERATOR CHANGE;  Surgeon: [name], MD;  Location: [facility] CATH [facility];  Service: Cardiovascular;  Laterality: N/A;   JOINT REPLACEMENT  06/14/01   right   LUMBAR FUSION  2006   MASS EXCISION  11/08/2011   Procedure: EXCISION MASS;  Surgeon: [name], MD;  Location: [facility];  Service: General;  Laterality: Left;  Excision Left Thigh Mass   MASTECTOMY W/ SENTINEL NODE BIOPSY Left 06/04/2019   Procedure: LEFT MASTECTOMY WITH SENTINEL LYMPH NODE BIOPSY;  Surgeon: [name], MD;  Location: [facility];  Service: General;  Laterality: Left;   MASTECTOMY, PARTIAL  2008   GOT PACEMAKER AND DEFIB AT THAT TIME    PACEMAKER INSERTION  04/23/06   TOTAL KNEE ARTHROPLASTY  05/17/01   RIGHT KNEE   TOTAL KNEE ARTHROPLASTY Left 11/29/2014   Procedure: TOTAL LEFT KNEE ARTHROPLASTY;  Surgeon: Paralee Cancel, MD;  Location: [facility];  Service: Orthopedics;  Laterality: Left;    Allergies  Allergen Reactions   Iodine Shortness Of Breath    Iodine contrast, CHF , SOB   Shellfish Allergy Shortness Of Breath   Memantine     Malaise, fogginess/ couldn't think   Aspirin Nausea And Vomiting   Codeine Nausea And Vomiting    Outpatient Encounter Medications as of 05/16/2022  Medication Sig   acetaminophen (TYLENOL) 500 MG tablet Take 500 mg by mouth 2 (two) times daily.  in the morning and afternoon   acetaminophen (TYLENOL) 650 MG CR tablet Take 650 mg by mouth at bedtime.   albuterol (VENTOLIN HFA) 108 (90 Base) MCG/ACT inhaler Inhale 2 puffs into the lungs every 6 (six) hours as needed for wheezing or shortness of breath.   ascorbic acid (VITAMIN C) 500 MG tablet Take 500 mg by mouth daily.   buPROPion (WELLBUTRIN XL) 150 MG 24 hr tablet Take 1 tablet (150 mg total) by mouth daily.   carvedilol (COREG) 3.125 MG tablet TAKE 1 TABLET BY MOUTH TWICE  DAILY   Cholecalciferol (VITAMIN D3) 50 MCG (2000 UT) TABS Take 2,000 Units by mouth daily.    diclofenac Sodium (VOLTAREN) 1 % GEL Apply 4 g topically 4 (four) times daily.   DULoxetine (CYMBALTA) 60 MG capsule Take 60 mg by mouth daily.   famotidine (PEPCID) 10 MG tablet Take 10 mg by mouth as needed for heartburn or indigestion.   fluticasone (FLONASE) 50 MCG/ACT nasal spray Place 2 sprays into both nostrils daily.   furosemide (LASIX) 20 MG tablet Take 1 tablet (20 mg total) by mouth daily as needed.   levothyroxine (SYNTHROID, LEVOTHROID) 100 MCG tablet Take 1 tablet (100 mcg total) by mouth daily before breakfast.   Lidocaine 4 % PTCH Apply 1 patch topically every 12 (twelve) hours. Apply to back   lubiprostone (AMITIZA) 8 MCG capsule TAKE ONE CAPSULE BY MOUTH TWICE  DAILY WITH A MEAL   Magnesium 250 MG TABS Take 250 mg by mouth daily.   mirabegron ER (MYRBETRIQ) 50 MG TB24 tablet Take 1 tablet (50 mg total) by mouth daily.   NONFORMULARY OR COMPOUNDED ITEM Apply 120 Tubes topically daily. Diclofenac/Cyclobenzaprine/lamotrigine/lidocaine/prilocaine (10480) 2%/2%/6%/5%/1.25% cream QTY: 120 GM SIG: (NEURO) APPLY 1-2 PUMPS (1-2 GMS) TO AFFECTED AREA (S) OR FOCAL POINTS 3 TO 4 TIMES DAILY. RUB IN FOR 2 MINUTES TO ACHIEVE MAX PENETRATION. [place] HANDS WELL.   Omega-3 Fatty Acids (FISH OIL PO) Take 1 capsule by mouth daily.   omeprazole (PRILOSEC) 40 MG capsule TAKE 1 CAPSULE BY MOUTH DAILY   polyethylene glycol powder (GLYCOLAX/MIRALAX) 17 GM/SCOOP powder Take 1 Container by mouth  daily.   pregabalin (LYRICA) 25 MG capsule Take 2 capsules (50 mg total) by mouth at bedtime. For nerve pain/back pain   senna-docusate (SENOKOT S) 8.6-50 MG tablet Take 2 tablets by mouth daily.   solifenacin (VESICARE) 5 MG tablet Take 1 tablet by mouth daily.   spironolactone (ALDACTONE) 25 MG tablet TAKE 1 TABLET BY MOUTH IN THE  MORNING   telmisartan (MICARDIS) 20 MG tablet Take 1 tablet (20 mg total) by mouth daily.   Vitamin E 400 units TABS Take 400 Units by mouth daily.    NALTREXONE HCL PO Take 4 mg by mouth at bedtime.   [DISCONTINUED] estradiol (ESTRACE) 0.1 MG/GM vaginal cream APPLY A SMALL AT INTROITUS DAILY FOR TWO WEEKS, THEN THREE TIMES A WEEK FOR TWO WEEKS THEN TWICE A WEEK FOR TWO WEEKS   No facility-administered encounter medications on file as of 05/16/2022.    Review of Systems  Constitutional:  Negative for appetite change, chills, fatigue and fever.  Gastrointestinal:  Positive for abdominal pain. Negative for abdominal distention and nausea.  Genitourinary:  Positive for frequency. Negative for difficulty urinating, dysuria, flank pain, hematuria and urgency.  Musculoskeletal:  Positive for arthralgias and back pain.  Skin:  Negative for color change, pallor  and rash.    Immunization History  Administered Date(s) Administered   Fluad Quad(high Dose 65+) 01/28/2020, 01/17/2021   Influenza, High Dose Seasonal PF 01/16/2018, 12/08/2018, 01/12/2022   Influenza-Unspecified 12/08/2013, 01/10/2015, 12/09/2015, 01/12/2017, 01/17/2021   PFIZER(Purple Top)SARS-COV-2 Vaccination 05/23/2019, 06/15/2019, 12/23/2019, 08/30/2020   PNEUMOCOCCAL CONJUGATE-20 01/05/2022   Pfizer Covid-19 Vaccine Bivalent Booster 6yr & up 02/03/2021, 01/12/2022   Pneumococcal Conjugate-13 04/09/2012   Pneumococcal Polysaccharide-23 02/28/2016   Respiratory Syncytial Virus Vaccine,Recomb Aduvanted(Arexvy) 01/05/2022   Tdap 01/07/2013   Zoster Recombinat (Shingrix) 02/03/2021, 06/26/2021   Zoster, Live 08/05/2014, 02/03/2021   Pertinent  Health Maintenance Due  Topic Date Due   MAMMOGRAM  03/28/2022   INFLUENZA VACCINE  Completed   DEXA SCAN  Completed      01/22/2022   11:25 AM 01/24/2022    1:05 PM 02/23/2022    2:53 PM 05/04/2022   10:36 AM 05/16/2022    2:29 PM  Fall Risk  Falls in the past year? 0 0 0 0 0  Was there an injury with Fall?  0 0 0 0  Fall Risk Category Calculator  0 0 0 0  Fall Risk Category (Retired)  Low Low    (RETIRED) Patient Fall Risk Level  Low fall risk Low fall risk    Patient at Risk for Falls Due to   No Fall Risks No Fall Risks No Fall Risks  Fall risk Follow up  Falls evaluation completed Falls evaluation completed Falls evaluation completed Falls evaluation completed   Functional Status Survey:    Vitals:   05/16/22 1430  BP: 127/77  Pulse: 78  Resp: 18  Temp: (!) 97.1 F (36.2 C)  SpO2: 95%  Weight: 114 lb (51.7 kg)  Height: '4\' 10"'$  (1.473 m)   Body mass index is 23.83 kg/m. Physical Exam Vitals reviewed.  Constitutional:      General: She is not in acute distress.    Appearance: Normal appearance. She is normal weight. She is not ill-appearing or diaphoretic.  HENT:     Head: Normocephalic.     Nose: Nose normal.  No congestion or rhinorrhea.  Neck:     Vascular: No carotid bruit.  Cardiovascular:     Rate and Rhythm: Normal rate and  regular rhythm.     Pulses: Normal pulses.     Heart sounds: Normal heart sounds. No murmur heard.    No friction rub. No gallop.  Pulmonary:     Effort: Pulmonary effort is normal. No respiratory distress.     Breath sounds: Normal breath sounds. No wheezing, rhonchi or rales.  Chest:     Chest wall: No tenderness.  Abdominal:     General: Bowel sounds are normal. There is no distension.     Palpations: Abdomen is soft. There is no mass.     Tenderness: There is abdominal tenderness in the right lower quadrant, suprapubic area and left lower quadrant. There is no right CVA tenderness, left CVA tenderness, guarding or rebound.  Musculoskeletal:     Cervical back: Normal range of motion. No rigidity or tenderness.  Lymphadenopathy:     Cervical: No cervical adenopathy.  Skin:    General: Skin is warm and dry.     Coloration: Skin is not pale.     Findings: No erythema or rash.  Neurological:     Mental Status: She is alert and oriented to person, place, and time.     Motor: No weakness.     Gait: Gait abnormal.  Psychiatric:        Mood and Affect: Mood normal.        Speech: Speech normal.        Behavior: Behavior normal.     Labs reviewed: Recent Labs    10/23/21 1528 12/04/21 1016 12/22/21 0912  NA 132* 134* 135  K 4.3 4.7 4.7  CL 97* 99 101  CO2 '27 27 28  '$ GLUCOSE 116 57* 73  BUN '21 19 19  '$ CREATININE 0.81 0.77 0.73  CALCIUM 10.9* 10.3 10.1   Recent Labs    06/20/21 0945 07/07/21 0951 12/04/21 1016 12/22/21 0912  AST '22 29 22 29  '$ ALT '18 18 20 23  '$ ALKPHOS 60  --   --  69  BILITOT 0.4 0.4 0.3 0.4  PROT 7.0 7.2 6.9 6.5  ALBUMIN 4.0  --   --  3.6   Recent Labs    12/04/21 1016 12/22/21 0912 01/17/22 1116  WBC 5.3 5.9 7.1  NEUTROABS 3,466 3.7 5,098  HGB 12.0 11.8* 12.3  HCT 36.4 35.2* 38.0  MCV 90.1 88.7 90.7  PLT 144 151 144    Lab Results  Component Value Date   TSH 0.81 06/02/2021   No results found for: "HGBA1C" Lab Results  Component Value Date   CHOL 171 11/17/2020   HDL 63 11/17/2020   LDLCALC 90 11/17/2020   TRIG 86 11/17/2020   CHOLHDL 2.7 11/17/2020    Significant Diagnostic Results in last 30 days:  Ultrasound renal complete  Result Date: 05/07/2022 CLINICAL DATA:  renal cyst EXAM: RENAL / URINARY TRACT ULTRASOUND COMPLETE COMPARISON:  May 01, 2021; December 27, 2021 FINDINGS: Right Kidney: Renal measurements: 9.4 x 5.1 x 4.4 cm = volume: 111 mL. Echogenicity within normal limits. No suspicious mass or hydronephrosis visualized. There is a benign cyst of the inferior pole which measures 14 x 12 x 12 mm (for which no dedicated imaging follow-up is recommended). Left Kidney: Renal measurements: 9.6 x 4.4 x 4.6 cm = volume: 103 mL. Echogenicity within normal limits. No suspicious mass or hydronephrosis visualized. There is a similar appearance of a 6 by 5 by 8 mm simple cyst of the inferior pole (for which no dedicated imaging follow-up is recommended). Bladder: Appears normal  for degree of bladder distention. Other: None. IMPRESSION: Similar appearance of bilateral benign renal cysts. No dedicated imaging follow-up is recommended. Electronically Signed   By: Valentino Saxon M.D.   On: 05/07/2022 16:16   CUP PACEART REMOTE DEVICE CHECK  Result Date: 05/03/2022 Scheduled remote reviewed. Normal device function.  Ther ewere two short NSVT arrhythmias detected that appear to be supraventricular in origin Next remote 91 days. Kathy Breach, RN, CCDS, [employer]   Assessment/Plan  Urine frequency Afebrile  Right lower abdominal and suprapubic tenderness on exam  - encouraged to increase fluid intake  - Advised to take OTC cranberry tablet one by mouth twice daily  Will send urine for culture    - Urine Culture - POC Urinalysis Dipstick  Family/ staff Communication: Reviewed plan of  care with patient verbalized understanding.  Labs/tests ordered:  - Urine Culture - POC Urinalysis Dipstick  Next Appointment: Return if symptoms worsen or fail to improve.   Sandrea Hughs, NP

## 2022-05-17 ENCOUNTER — Encounter: Payer: Self-pay | Admitting: Nurse Practitioner

## 2022-05-17 LAB — URINE CULTURE
MICRO NUMBER:: 14532603
Result:: NO GROWTH
SPECIMEN QUALITY:: ADEQUATE

## 2022-05-18 ENCOUNTER — Encounter: Payer: Self-pay | Admitting: Nurse Practitioner

## 2022-05-18 ENCOUNTER — Ambulatory Visit (INDEPENDENT_AMBULATORY_CARE_PROVIDER_SITE_OTHER): Payer: Medicare Other | Admitting: Nurse Practitioner

## 2022-05-18 VITALS — BP 122/62 | HR 75 | Temp 97.9°F | Resp 17 | Ht <= 58 in | Wt 115.2 lb

## 2022-05-18 DIAGNOSIS — Z1231 Encounter for screening mammogram for malignant neoplasm of breast: Secondary | ICD-10-CM

## 2022-05-18 DIAGNOSIS — E2839 Other primary ovarian failure: Secondary | ICD-10-CM | POA: Diagnosis not present

## 2022-05-18 DIAGNOSIS — Z0000 Encounter for general adult medical examination without abnormal findings: Secondary | ICD-10-CM

## 2022-05-18 NOTE — Progress Notes (Signed)
Subjective:   [name] is a 89 y.o. female who presents for Medicare Annual (Subsequent) preventive examination.  Review of Systems     Cardiac Risk Factors include: advanced age (>21mn, >>81women);sedentary lifestyle;hypertension     Objective:    Today's Vitals   05/17/22 1701 05/18/22 1523  BP: 122/62   Pulse: 75   Resp: 17   Temp: 97.9 F (36.6 C)   TempSrc: Temporal   SpO2: 97%   Weight: 115 lb 3.2 oz (52.3 kg)   Height: 4' 10"$  (1.473 m)   PainSc:  5    Body mass index is 24.08 kg/m.     05/18/2022    3:02 PM 05/17/2022    5:01 PM 05/04/2022   10:36 AM 02/23/2022    2:20 PM 02/23/2022   11:01 AM 01/24/2022    1:05 PM 01/17/2022   10:44 AM  Advanced Directives  Does Patient Have a Medical Advance Directive? Yes Yes Yes Yes Yes Yes Yes  Type of [place];Out of facility DNR (pink MOST or yellow form) [place];Out of facility DNR (pink MOST or yellow form) [place]Living will;Out of facility DNR (pink MOST or yellow form) Living will;Out of facility DNR (pink MOST or yellow form) [place]Living will  [place]Living will;Out of facility DNR (pink MOST or yellow form)  Does patient want to make changes to medical advance directive?  No - Patient declined No - Patient declined No - Patient declined No - Patient declined  No - Patient declined  Copy of [place] Chart? Yes - validated most recent copy scanned in chart (See row information) Yes - validated most recent copy scanned in chart (See row information) Yes - validated most recent copy scanned in chart (See row information) No - copy requested No - copy requested  Yes - validated most recent copy scanned in chart (See row information)  Pre-existing out of facility DNR order (yellow form or pink MOST form) Yellow form placed in chart (order not  valid for inpatient use) Yellow form placed in chart (order not valid for inpatient use)  Yellow form placed in chart (order not valid for inpatient use)       Current Medications (verified) Outpatient Encounter Medications as of 05/18/2022  Medication Sig   acetaminophen (TYLENOL) 500 MG tablet Take 500 mg by mouth 2 (two) times daily.  in the morning and afternoon   acetaminophen (TYLENOL) 650 MG CR tablet Take 650 mg by mouth at bedtime.   albuterol (VENTOLIN HFA) 108 (90 Base) MCG/ACT inhaler Inhale 2 puffs into the lungs every 6 (six) hours as needed for wheezing or shortness of breath.   ascorbic acid (VITAMIN C) 500 MG tablet Take 500 mg by mouth daily.   buPROPion (WELLBUTRIN XL) 150 MG 24 hr tablet Take 1 tablet (150 mg total) by mouth daily.   carvedilol (COREG) 3.125 MG tablet TAKE 1 TABLET BY MOUTH TWICE  DAILY   Cholecalciferol (VITAMIN D3) 50 MCG (2000 UT) TABS Take 2,000 Units by mouth daily.    diclofenac Sodium (VOLTAREN) 1 % GEL Apply 4 g topically 4 (four) times daily.   DULoxetine (CYMBALTA) 60 MG capsule Take 60 mg by mouth daily.   famotidine (PEPCID) 10 MG tablet Take 10 mg by mouth as needed for heartburn or indigestion.   fluticasone (FLONASE) 50 MCG/ACT nasal spray Place 2 sprays into both  nostrils daily.   furosemide (LASIX) 20 MG tablet Take 1 tablet (20 mg total) by mouth daily as needed.   levothyroxine (SYNTHROID, LEVOTHROID) 100 MCG tablet Take 1 tablet (100 mcg total) by mouth daily before breakfast.   Lidocaine 4 % PTCH Apply 1 patch topically every 12 (twelve) hours. Apply to back   lubiprostone (AMITIZA) 8 MCG capsule TAKE ONE CAPSULE BY MOUTH TWICE DAILY WITH A MEAL   Magnesium 250 MG TABS Take 250 mg by mouth daily.   mirabegron ER (MYRBETRIQ) 50 MG TB24 tablet Take 1 tablet (50 mg total) by mouth daily.   NONFORMULARY OR COMPOUNDED ITEM Apply 120 Tubes topically daily. Diclofenac/Cyclobenzaprine/lamotrigine/lidocaine/prilocaine (10480) 2%/2%/6%/5%/1.25%  cream QTY: 120 GM SIG: (NEURO) APPLY 1-2 PUMPS (1-2 GMS) TO AFFECTED AREA (S) OR FOCAL POINTS 3 TO 4 TIMES DAILY. RUB IN FOR 2 MINUTES TO ACHIEVE MAX PENETRATION. [place] HANDS WELL.   Omega-3 Fatty Acids (FISH OIL PO) Take 1 capsule by mouth daily.   omeprazole (PRILOSEC) 40 MG capsule TAKE 1 CAPSULE BY MOUTH DAILY   polyethylene glycol powder (GLYCOLAX/MIRALAX) 17 GM/SCOOP powder Take 1 Container by mouth daily.   pregabalin (LYRICA) 25 MG capsule Take 2 capsules (50 mg total) by mouth at bedtime. For nerve pain/back pain   senna-docusate (SENOKOT S) 8.6-50 MG tablet Take 2 tablets by mouth daily.   solifenacin (VESICARE) 5 MG tablet Take 1 tablet by mouth daily.   spironolactone (ALDACTONE) 25 MG tablet TAKE 1 TABLET BY MOUTH IN THE  MORNING   telmisartan (MICARDIS) 20 MG tablet Take 1 tablet (20 mg total) by mouth daily.   Vitamin E 400 units TABS Take 400 Units by mouth daily.    NALTREXONE HCL PO Take 4 mg by mouth at bedtime.   No facility-administered encounter medications on file as of 05/18/2022.    Allergies (verified) Iodine, Shellfish allergy, Memantine, Aspirin, and Codeine   History: Past Medical History:  Diagnosis Date   Arthritis    Cancer ([place])    left breast cancer    Cataracts, bilateral    removed by surgery   CHF (congestive heart failure) ([place])    PACEMAKER & DEFIB   Complication of anesthesia    hypotensive after back surgery in 2006   Depression    Dyslipidemia    Fainted 04/21/06   AT [facility]   GERD (gastroesophageal reflux disease)    Headache(784.0)    Hearing loss    bilateral hearing aids   HLD (hyperlipidemia)    diet controlled    Hypertension    Hypothyroidism    ICD (implantable cardiac defibrillator) in place    pt has pacer/icd   ICD (implantable cardiac defibrillator), biventricular, in situ    LBBB (left bundle branch block)    Memory loss    Nonischemic cardiomyopathy ([place])    Normal coronary arteries    s/p cardiac cath 2007    Pacemaker    ICD [employer]   Syncope    Systolic CHF [facility])    Vertigo    Wears glasses    Past Surgical History:  Procedure Laterality Date   BACK SURGERY     lumbar fusion    BREAST LUMPECTOMY Left 2008   BREAST SURGERY  2000   LUMP REMOVAL. STAGE 1 CANCER   CARDIAC CATHETERIZATION     CATARACT EXTRACTION     COLONOSCOPY     EYE SURGERY     IMPLANTABLE CARDIOVERTER DEFIBRILLATOR GENERATOR CHANGE N/A 12/18/2012   Procedure: IMPLANTABLE CARDIOVERTER  DEFIBRILLATOR GENERATOR CHANGE;  Surgeon: [name], MD;  [facility] C[facility] LAB;  Service: Cardiovascular;  Laterality: N/A;   JOINT REPLACEMENT  06/14/01   right   LUMBAR FUSION  2006   MASS EXCISION  11/08/2011   Procedure: EXCISION MASS;  Surgeon: [name], MD;  Location: [facility] OR[facility];  Service: General;  Laterality: Left;  Excision Left Thigh Mass   MASTECTOMY W/ SENTINEL NODE BIOPSY Left 06/04/2019   Procedure: LEFT MASTECTOMY WITH SENTINEL LYMPH NODE BIOPSY;  Surgeon: [name], MD;  Location: [facility];  Service: General;  Laterality: Left;   MASTECTOMY, PARTIAL  2008   GOT PACEMAKER AND DEFIB AT THAT TIME   PACEMAKER INSERTION  04/23/06   TOTAL KNEE ARTHROPLASTY  05/17/01   RIGHT KNEE   TOTAL KNEE ARTHROPLASTY Left 11/29/2014   Procedure: TOTAL LEFT KNEE ARTHROPLASTY;  Surgeon: Paralee Cancel, MD;  Location: [facility];  Service: Orthopedics;  Laterality: Left;   Family History  Problem Relation Age of Onset   Hypertension Mother    Arthritis Mother    Hypertension Father    Hypertension Brother    Hypertension Brother    Social History   Socioeconomic History   Marital status: Married    Spouse name: Not on file   Number of children: 1   Years of education: Masters   Highest education level: Not on file  Occupational History   Occupation: Retired  Tobacco Use   Smoking status: Never    Passive exposure: Never   Smokeless tobacco: Never  Vaping Use   Vaping Use: Never used  Substance and Sexual Activity    Alcohol use: No   Drug use: No   Sexual activity: Not Currently    Birth control/protection: Post-menopausal  Other Topics Concern   Not on file  Social History Narrative   Lives at home with husband.   Right-handed.      As of 07/28/2014   Diet: No special diet   Caffeine: yes, Chocolate, tea and sodas    Married: YES, 1970   House: Yes, 2 stories, 2-3 persons live in home   Pets: No   Current/Past profession: Engineer, mining, Designer, jewellery    Exercise: Yes 2-3 x weekly   Living Will: Yes   DNR: No   POA/HPOA: No      Social Determinants of Health   Financial Resource Strain: Low Risk  (03/27/2017)   Overall Financial Resource Strain (CARDIA)    Difficulty of Paying Living Expenses: Not hard at all  Food Insecurity: No Food Insecurity (03/27/2017)   Hunger Vital Sign    Worried About Running Out of Food in the Last Year: Never true    [place] in the Last Year: Never true  Transportation Needs: No Transportation Needs (03/27/2017)   PRAPARE - Hydrologist (Medical): No    Lack of Transportation (Non-Medical): No  Physical Activity: Insufficiently Active (03/27/2017)   Exercise Vital Sign    Days of Exercise per Week: 4 days    Minutes of Exercise per Session: 30 min  Stress: Stress Concern Present (03/27/2017)   [place]    Feeling of Stress : To some extent  Social Connections: Socially Integrated (03/27/2017)   Social Connection and Isolation Panel [NHANES]    Frequency of Communication with Friends and Family: More than three times a week    Frequency of Social Gatherings with Friends and Family: Once  a week    Attends Religious Services: More than 4 times per year    Active Member of Clubs or Organizations: Yes    Attends Archivist Meetings: Never    Marital Status: Married    Tobacco Counseling Counseling given: Not Answered   Clinical  Intake:  Pre-visit preparation completed: Yes  Pain : 0-10 Pain Score: 5  Pain Type: Acute pain Pain Location: Abdomen Pain Orientation: Left, Right, Lower     BMI - recorded: 24 Nutritional Status: BMI of 19-24  Normal Nutritional Risks: Unintentional weight loss Diabetes: No  How often do you need to have someone help you when you read instructions, pamphlets, or other written materials from your doctor or pharmacy?: 1 - Never  Diabetic?         Activities of Daily Living    05/18/2022    3:20 PM 05/30/2021    2:54 PM  In your present state of health, do you have any difficulty performing the following activities:  Hearing? 1 1  Vision? 0 0  Difficulty concentrating or making decisions? 1 1  Walking or climbing stairs? 1 1  Dressing or bathing? 0 0  Doing errands, shopping? 1 1  Comment  daughter helps her  Preparing Food and eating ? N N  Using the Toilet? N N  In the past six months, have you accidently leaked urine? Y Y  Do you have problems with loss of bowel control? N N  Managing your Medications? N N  Managing your Finances? Tempie Donning  Comment daughter does daughter helps  Housekeeping or managing your Housekeeping? Aggie Moats  Comment daughter helps     Patient Care Team: [name], NP as PCP - General (Geriatric Medicine) [name], MD as PCP - Electrophysiology (Cardiology) [name], MD as Consulting Physician (General Surgery) Paralee Cancel, MD as Consulting Physician (Orthopedic Surgery) Marica Otter, [place] (Optometry) Marcial Pacas, MD as Consulting Physician (Neurology) [name], MD as Consulting Physician (Cardiology) Ricard Dillon, MD (Psychiatry)  Indicate any recent Medical Services you may have received from other than [facility] providers in the past year (date may be approximate).     Assessment:   This is a routine wellness examination for [name].  Hearing/Vision screen No results found.  Dietary issues and exercise  activities discussed: Current Exercise Habits: Home exercise routine, Type of exercise: calisthenics, Time (Minutes): 30, Frequency (Times/Week): 2, Weekly Exercise (Minutes/Week): 60   Goals Addressed   None    Depression Screen    05/18/2022    3:02 PM 05/16/2022    2:29 PM 04/30/2022   11:05 AM 02/23/2022    2:53 PM 01/22/2022   11:26 AM 10/02/2021    3:18 PM 06/19/2021    1:44 PM  PHQ 2/9 Scores  PHQ - 2 Score 0 0 0  2 0 0  Exception Documentation    Other- indicate reason in comment box     Not completed    Under the care of counsolor       Fall Risk    05/16/2022    2:29 PM 05/04/2022   10:36 AM 02/23/2022    2:53 PM 01/24/2022    1:05 PM 01/22/2022   11:25 AM  [place] in the past year? 0 0 0 0 0  Number falls in past yr: 0 0 0 0   Injury with Fall? 0 0 0 0   Risk for fall due to : No  Fall Risks No Fall Risks No Fall Risks    Follow up Falls evaluation completed Falls evaluation completed Falls evaluation completed Falls evaluation completed     [place]:  Any stairs in or around the home? No  If so, are there any without handrails?  na Home free of loose throw rugs in walkways, pet beds, electrical cords, etc? Yes  Adequate lighting in your home to reduce risk of falls? Yes   ASSISTIVE DEVICES UTILIZED TO PREVENT FALLS:  Life alert? No  Use of a cane, walker or w/c? Yes  Grab bars in the bathroom? Yes  Shower chair or bench in shower? Yes  Elevated toilet seat or a handicapped toilet? No   TIMED UP AND GO:  Was the test performed? No .    Cognitive Function:    05/18/2022    3:02 PM 01/24/2022    1:00 PM 01/28/2020   10:09 AM 07/23/2019   11:08 AM 01/21/2019   10:30 AM  MMSE - Mini Mental State Exam  Orientation to time 5 5 5 4 5  $ Orientation to Place 5 5 5 5 4  $ Registration 3 3 3 3 3  $ Attention/ Calculation 2 5 0 4 1  Recall 3 3 3 2 3  $ Language- name 2 objects 2 2 2 2 1  $ Language- repeat 1 1 0 1 1   Language- follow 3 step command 3 3 3 3 2  $ Language- follow 3 step command-comments     she didnt fold the paper  Language- read & follow direction 1 1 1 1 1  $ Write a sentence 1 1 1 1 1  $ Copy design 1 1 1 1 1  $ Copy design-comments    13 animals   Total score 27 30 24 27 23      $ 10/13/2020   10:00 AM  Montreal Cognitive Assessment   Visuospatial/ Executive (0/5) 2  Naming (0/3) 3  Attention: Read list of digits (0/2) 2  Attention: Read list of letters (0/1) 1  Attention: Serial 7 subtraction starting at 100 (0/3) 2  Language: Repeat phrase (0/2) 1  Language : Fluency (0/1) 1  Abstraction (0/2) 1  Delayed Recall (0/5) 2  Orientation (0/6) 6  Total 21  Adjusted Score (based on education) 21      05/30/2021    2:36 PM  6CIT Screen  What Year? 0 points  What month? 0 points  What time? 0 points  Count back from 20 0 points  Months in reverse 0 points  Repeat phrase 2 points  Total Score 2 points    Immunizations Immunization History  Administered Date(s) Administered   Fluad Quad(high Dose 65+) 01/28/2020, 01/17/2021   Influenza, High Dose Seasonal PF 01/16/2018, 12/08/2018, 01/12/2022   Influenza-Unspecified 12/08/2013, 01/10/2015, 12/09/2015, 01/12/2017, 01/17/2021   PFIZER(Purple Top)SARS-COV-2 Vaccination 05/23/2019, 06/15/2019, 12/23/2019, 08/30/2020   PNEUMOCOCCAL CONJUGATE-20 01/05/2022   Pfizer Covid-19 Vaccine Bivalent Booster 32yr & up 02/03/2021, 01/12/2022   Pneumococcal Conjugate-13 04/09/2012   Pneumococcal Polysaccharide-23 02/28/2016   Respiratory Syncytial Virus Vaccine,Recomb Aduvanted(Arexvy) 01/05/2022   Tdap 01/07/2013   Zoster Recombinat (Shingrix) 02/03/2021, 06/26/2021   Zoster, Live 08/05/2014, 02/03/2021    TDAP status: Up to date  Flu Vaccine status: Up to date  Pneumococcal vaccine status: Up to date  Covid-19 vaccine status: Information provided on how to obtain vaccines.   Qualifies for Shingles Vaccine? Yes   Zostavax completed  No   Shingrix Completed?: Yes  Screening Tests Health  Maintenance  Topic Date Due   COVID-19 Vaccine (7 - 2023-24 season) 11/16/2022 (Originally 03/09/2022)   DTaP/Tdap/Td (2 - Td or Tdap) 01/08/2023   MAMMOGRAM  04/04/2023   Medicare Annual Wellness (AWV)  05/19/2023   Pneumonia Vaccine 7+ Years old  Completed   INFLUENZA VACCINE  Completed   DEXA SCAN  Completed   Zoster Vaccines- Shingrix  Completed   HPV VACCINES  Aged Out    Health Maintenance  There are no preventive care reminders to display for this patient.   Colorectal cancer screening: No longer required.   Mammogram status: Completed DEC 2023. Repeat every year  Bone Density status: Ordered today. Pt provided with contact info and advised to call to schedule appt.  Lung Cancer Screening: (Low Dose CT Chest recommended if Age 30-80 years, 30 pack-year currently smoking OR have quit w/in 15years.) does not qualify.   Lung Cancer Screening Referral: na  Additional Screening:  Hepatitis C Screening: does not qualify;  Vision Screening: Recommended annual ophthalmology exams for early detection of glaucoma and other disorders of the eye. Is the patient up to date with their annual eye exam?  Yes  Who is the provider or what is the name of the office in which the patient attends annual eye exams? Dr Katy Fitch If pt is not established with a provider, would they like to be referred to a provider to establish care? No .   Dental Screening: Recommended annual dental exams for proper oral hygiene  Community Resource Referral / Chronic Care Management: CRR required this visit?  No   CCM required this visit?  No      Plan:     I have personally reviewed and noted the following in the patient's chart:   Medical and social history Use of alcohol, tobacco or illicit drugs  Current medications and supplements including opioid prescriptions. Patient is not currently taking opioid prescriptions. Functional ability and  status Nutritional status Physical activity Advanced directives List of other physicians Hospitalizations, surgeries, and ER visits in previous 12 months Vitals Screenings to include cognitive, depression, and falls Referrals and appointments  In addition, I have reviewed and discussed with patient certain preventive protocols, quality metrics, and best practice recommendations. A written personalized care plan for preventive services as well as general preventive health recommendations were provided to patient.     [name], NP   05/18/2022   Place of service: [facility]

## 2022-05-18 NOTE — Patient Instructions (Signed)
[name] , Thank you for taking time to come for your Medicare Wellness Visit. I appreciate your ongoing commitment to your health goals. Please review the following plan we discussed and let me know if I can assist you in the future.   Screening recommendations/referrals: Colonoscopy aged out Mammogram please have [facility] fax over results.  Bone Density ordered today- call [facility] to schedule Recommended yearly ophthalmology/optometry visit for glaucoma screening and checkup Recommended yearly dental visit for hygiene and checkup  Vaccinations: Influenza vaccine- due annually in September/October Pneumococcal vaccine up to date Tdap vaccine up to date Shingles vaccine up to date    Advanced directives: on file  Conditions/risks identified: advanced age, at risk for falls  Next appointment: yearly for AWV   Preventive Care 55 Years and Older, Female Preventive care refers to lifestyle choices and visits with your health care provider that can promote health and wellness. What does preventive care include? A yearly physical exam. This is also called an annual well check. Dental exams once or twice a year. Routine eye exams. Ask your health care provider how often you should have your eyes checked. Personal lifestyle choices, including: Daily care of your teeth and gums. Regular physical activity. Eating a healthy diet. Avoiding tobacco and drug use. Limiting alcohol use. Practicing safe sex. Taking low-dose aspirin every day. Taking vitamin and mineral supplements as recommended by your health care provider. What happens during an annual well check? The services and screenings done by your health care provider during your annual well check will depend on your age, overall health, lifestyle risk factors, and family history of disease. Counseling  Your health care provider may ask you questions about your: Alcohol use. Tobacco use. Drug use. Emotional well-being. Home and  relationship well-being. Sexual activity. Eating habits. History of falls. Memory and ability to understand (cognition). Work and work Statistician. Reproductive health. Screening  You may have the following tests or measurements: Height, weight, and BMI. Blood pressure. Lipid and cholesterol levels. These may be checked every 5 years, or more frequently if you are over 68 years old. Skin check. Lung cancer screening. You may have this screening every year starting at age 25 if you have a 30-pack-year history of smoking and currently smoke or have quit within the past 15 years. Fecal occult blood test (FOBT) of the stool. You may have this test every year starting at age 82. Flexible sigmoidoscopy or colonoscopy. You may have a sigmoidoscopy every 5 years or a colonoscopy every 10 years starting at age 32. Hepatitis C blood test. Hepatitis B blood test. Sexually transmitted disease (STD) testing. Diabetes screening. This is done by checking your blood sugar (glucose) after you have not eaten for a while (fasting). You may have this done every 1-3 years. Bone density scan. This is done to screen for osteoporosis. You may have this done starting at age 69. Mammogram. This may be done every 1-2 years. Talk to your health care provider about how often you should have regular mammograms. Talk with your health care provider about your test results, treatment options, and if necessary, the need for more tests. Vaccines  Your health care provider may recommend certain vaccines, such as: Influenza vaccine. This is recommended every year. Tetanus, diphtheria, and acellular pertussis (Tdap, Td) vaccine. You may need a Td booster every 10 years. Zoster vaccine. You may need this after age 87. Pneumococcal 13-valent conjugate (PCV13) vaccine. One dose is recommended after age 42. Pneumococcal polysaccharide (PPSV23) vaccine. One  dose is recommended after age 61. Talk to your health care provider  about which screenings and vaccines you need and how often you need them. This information is not intended to replace advice given to you by your health care provider. Make sure you discuss any questions you have with your health care provider. Document Released: 04/22/2015 Document Revised: 12/14/2015 Document Reviewed: 01/25/2015 Elsevier Interactive Patient Education  2017 [employer] Prevention in the Home Falls can cause injuries. They can happen to people of all ages. There are many things you can do to make your home safe and to help prevent falls. What can I do on the outside of my home? Regularly fix the edges of walkways and driveways and fix any cracks. Remove anything that might make you trip as you walk through a door, such as a raised step or threshold. Trim any bushes or trees on the path to your home. Use bright outdoor lighting. Clear any walking paths of anything that might make someone trip, such as rocks or tools. Regularly check to see if handrails are loose or broken. Make sure that both sides of any steps have handrails. Any raised decks and porches should have guardrails on the edges. Have any leaves, snow, or ice cleared regularly. Use sand or salt on walking paths during winter. Clean up any spills in your garage right away. This includes oil or grease spills. What can I do in the bathroom? Use night lights. Install grab bars by the toilet and in the tub and shower. Do not use towel bars as grab bars. Use non-skid mats or decals in the tub or shower. If you need to sit down in the shower, use a plastic, non-slip stool. Keep the floor dry. Clean up any water that spills on the floor as soon as it happens. Remove soap buildup in the tub or shower regularly. Attach bath mats securely with double-sided non-slip rug tape. Do not have throw rugs and other things on the floor that can make you trip. What can I do in the bedroom? Use night lights. Make sure  that you have a light by your bed that is easy to reach. Do not use any sheets or blankets that are too big for your bed. They should not hang down onto the floor. Have a firm chair that has side arms. You can use this for support while you get dressed. Do not have throw rugs and other things on the floor that can make you trip. What can I do in the kitchen? Clean up any spills right away. Avoid walking on wet floors. Keep items that you use a lot in easy-to-reach places. If you need to reach something above you, use a strong step stool that has a grab bar. Keep electrical cords out of the way. Do not use floor polish or wax that makes floors slippery. If you must use wax, use non-skid floor wax. Do not have throw rugs and other things on the floor that can make you trip. What can I do with my stairs? Do not leave any items on the stairs. Make sure that there are handrails on both sides of the stairs and use them. Fix handrails that are broken or loose. Make sure that handrails are as long as the stairways. Check any carpeting to make sure that it is firmly attached to the stairs. Fix any carpet that is loose or worn. Avoid having throw rugs at the top or bottom of the  stairs. If you do have throw rugs, attach them to the floor with carpet tape. Make sure that you have a light switch at the top of the stairs and the bottom of the stairs. If you do not have them, ask someone to add them for you. What else can I do to help prevent falls? Wear shoes that: Do not have high heels. Have rubber bottoms. Are comfortable and fit you well. Are closed at the toe. Do not wear sandals. If you use a stepladder: Make sure that it is fully opened. Do not climb a closed stepladder. Make sure that both sides of the stepladder are locked into place. Ask someone to hold it for you, if possible. Clearly mark and make sure that you can see: Any grab bars or handrails. First and last steps. Where the edge of  each step is. Use tools that help you move around (mobility aids) if they are needed. These include: Canes. Walkers. Scooters. Crutches. Turn on the lights when you go into a dark area. Replace any light bulbs as soon as they burn out. Set up your furniture so you have a clear path. Avoid moving your furniture around. If any of your floors are uneven, fix them. If there are any pets around you, be aware of where they are. Review your medicines with your doctor. Some medicines can make you feel dizzy. This can increase your chance of falling. Ask your doctor what other things that you can do to help prevent falls. This information is not intended to replace advice given to you by your health care provider. Make sure you discuss any questions you have with your health care provider. Document Released: 01/20/2009 Document Revised: 09/01/2015 Document Reviewed: 04/30/2014 Elsevier Interactive Patient Education  2017 [employer].

## 2022-05-21 ENCOUNTER — Ambulatory Visit (INDEPENDENT_AMBULATORY_CARE_PROVIDER_SITE_OTHER): Payer: Medicare Other

## 2022-05-21 DIAGNOSIS — M810 Age-related osteoporosis without current pathological fracture: Secondary | ICD-10-CM

## 2022-05-21 MED ORDER — DENOSUMAB 60 MG/ML ~~LOC~~ SOSY
60.0000 mg | PREFILLED_SYRINGE | Freq: Once | SUBCUTANEOUS | Status: AC
  Administered 2022-05-21: 60 mg via SUBCUTANEOUS

## 2022-05-22 IMAGING — CT CT ABD-PELV W/O CM
2 of 4 series · 16 of 46 positions shown, 18 images · non-contrast
Comparison: 06/08/2020

CLINICAL DATA: Right lower quadrant abdominal pain since [REDACTED].

EXAM:
CT ABDOMEN AND PELVIS WITHOUT CONTRAST
TECHNIQUE: Multidetector CT imaging of the abdomen and pelvis was performed
following the standard protocol without IV contrast.

[Series 2: axial st · axial · 0.66mm/px · z∈[+1109,+1464]mm · 13 of 83 slices shown, 15 images]
[im 6/83  soft-tissue]
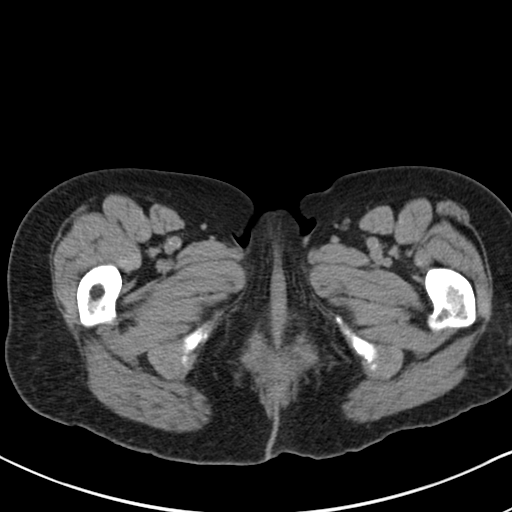
[im 6/83  bone]
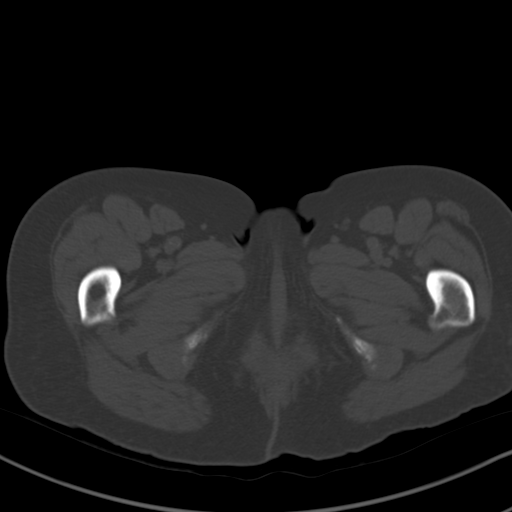
[im 11/83  soft-tissue]
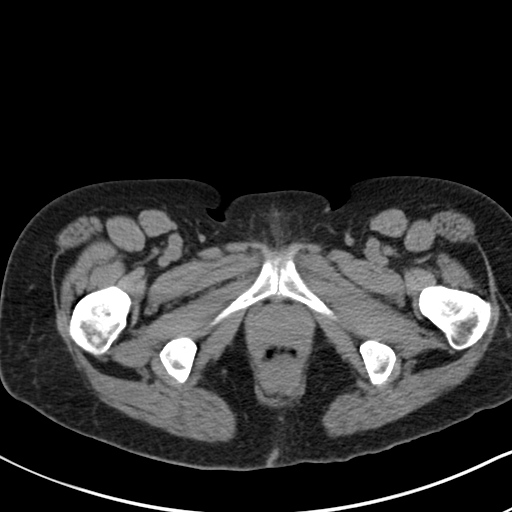
[im 16/83  soft-tissue]
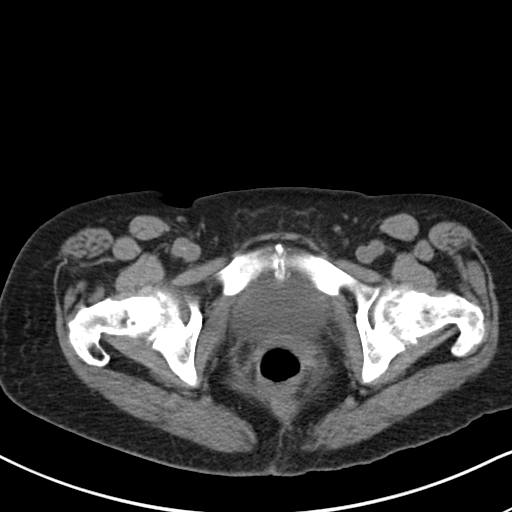
[im 26/83  soft-tissue]
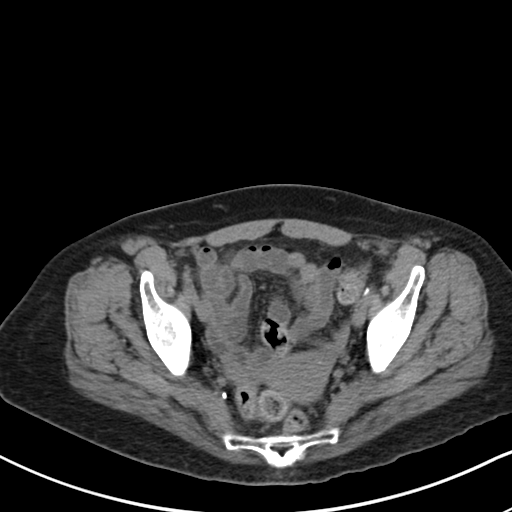
[im 31/83  soft-tissue]
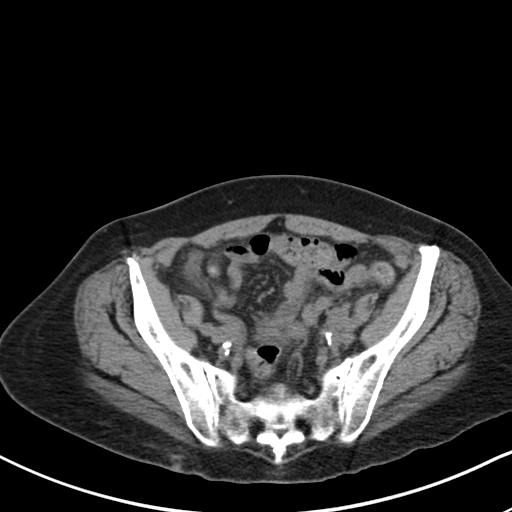
[im 36/83  soft-tissue]
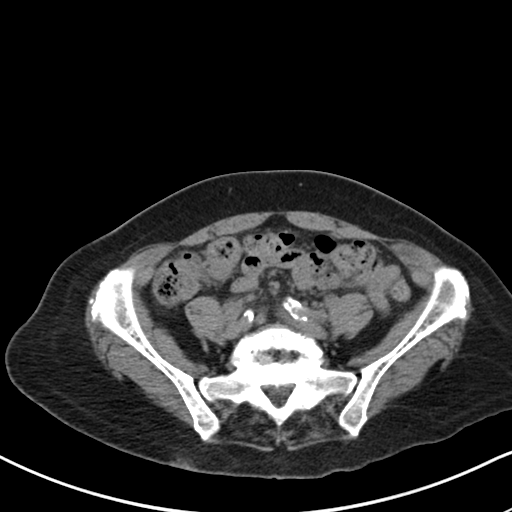
[im 42/83  soft-tissue]
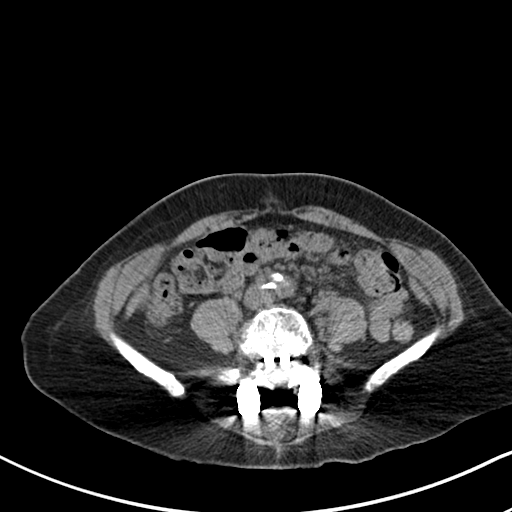
[im 47/83  soft-tissue]
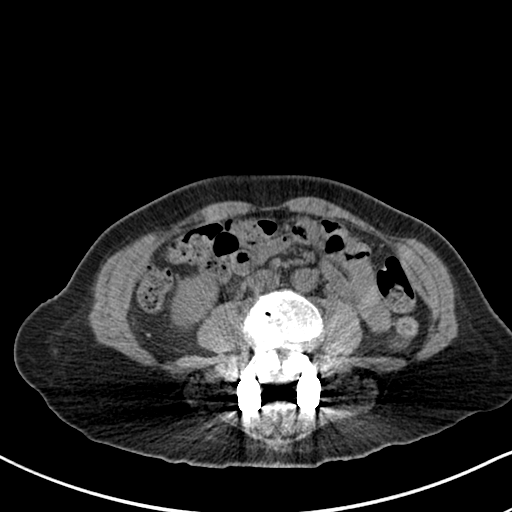
[im 52/83  soft-tissue]
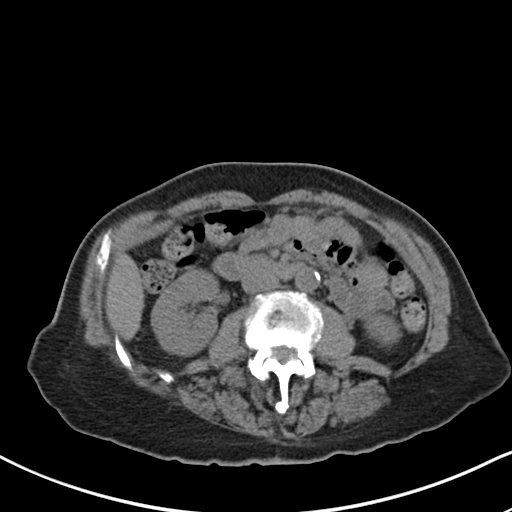
[im 52/83  bone]
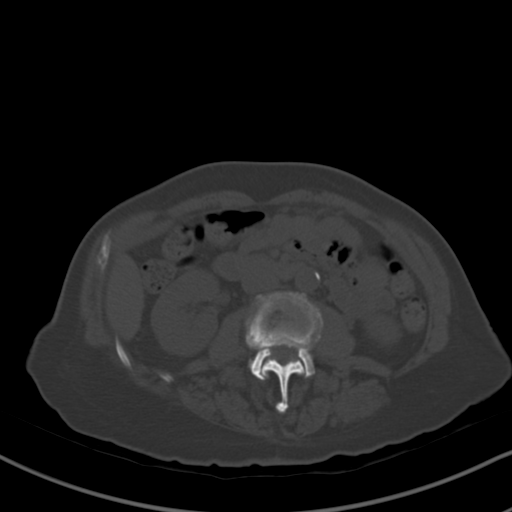
[im 57/83  soft-tissue]
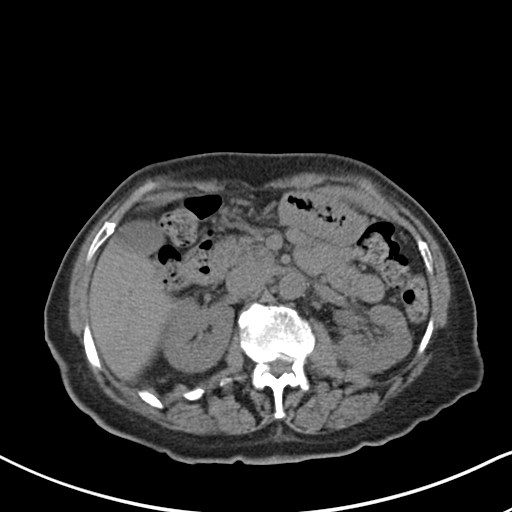
[im 67/83  soft-tissue]
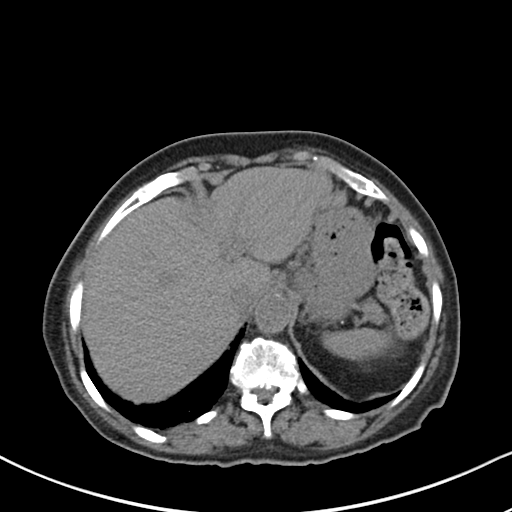
[im 72/83  soft-tissue]
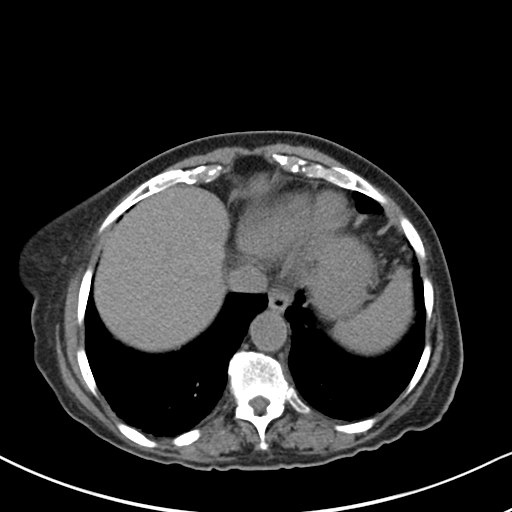
[im 77/83  soft-tissue]
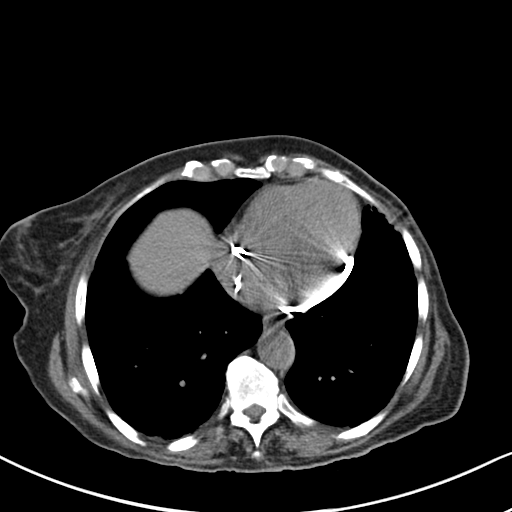

[Series 5: coronal st · coronal · 0.82mm/px · 3 of 107 slices shown]
[im 36/107  soft-tissue]
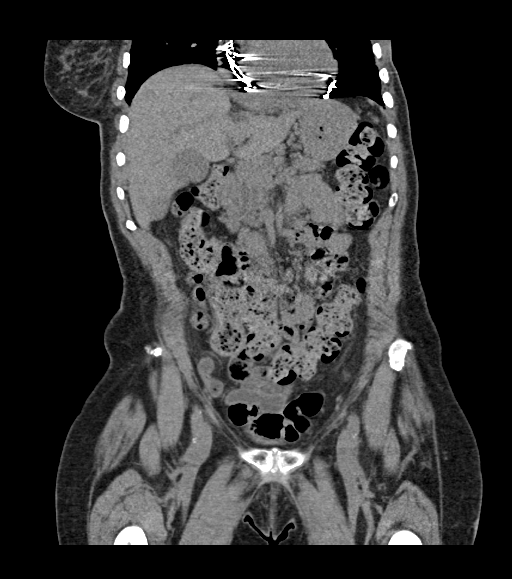
[im 48/107  soft-tissue]
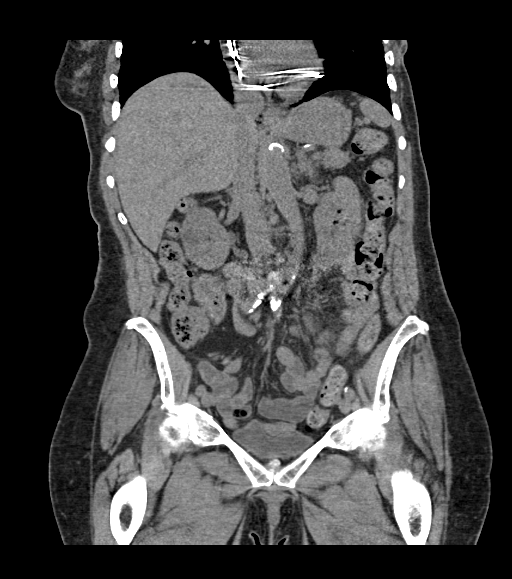
[im 59/107  soft-tissue]
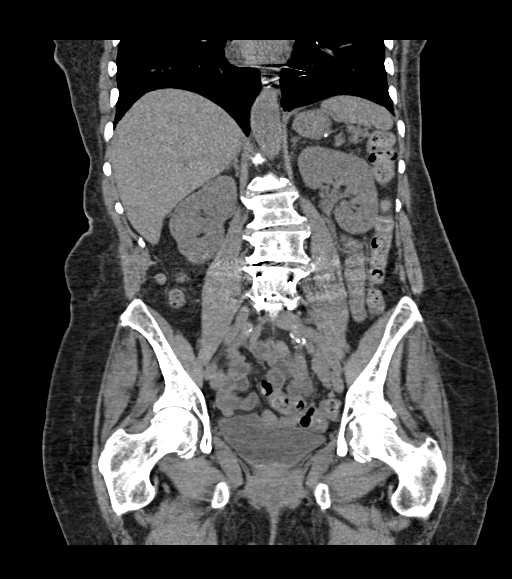

[16 of 46 positions shown; findings below may reference images not displayed]

FINDINGS: Lower chest: Chronic pulmonary scarring changes consistent with
interstitial lung disease as noted on recent chest CT. No focal
infiltrates, effusions or worrisome pulmonary lesions. The heart is
normal in size. Pacer wires are noted.

Hepatobiliary: Stable left hepatic lobe cyst. No worrisome hepatic
lesions or intrahepatic biliary dilatation. The gallbladder is
unremarkable. No common bile duct dilatation.

Pancreas: No mass, inflammation or ductal dilatation.

Spleen: Normal size.  No focal lesions.

Adrenals/Urinary Tract: The adrenal glands and kidneys are
unremarkable. Small right renal cyst is noted. The bladder is
unremarkable.

Stomach/Bowel: The stomach, duodenum, small bowel and colon are
grossly normal without oral contrast. No acute inflammatory process,
mass lesions or obstructive findings. The terminal ileum is normal.
The appendix is normal.

Vascular/Lymphatic: Stable advanced atherosclerotic calcifications
involving the aorta and branch vessels. No abdominal or pelvic mass
or adenopathy.

Reproductive: The uterus and ovaries are unremarkable.

Other: No pelvic mass or adenopathy. No free pelvic fluid
collections. No inguinal mass or adenopathy. No abdominal wall
hernia or subcutaneous lesions.

Musculoskeletal: Stable appearance of the lumbar fusion hardware. No
complicating features. Stable degenerative lumbar spondylosis with
multilevel disc disease and facet disease.
IMPRESSION: 1. No acute abdominal/pelvic findings, mass lesions or adenopathy.
2. Stable advanced atherosclerotic calcifications involving the
aorta and branch vessels.
3. Chronic pulmonary scarring changes at the lung bases.

Aortic Atherosclerosis ([id number].N).

## 2022-05-22 NOTE — Progress Notes (Signed)
Remote ICD transmission.   

## 2022-05-23 ENCOUNTER — Ambulatory Visit (INDEPENDENT_AMBULATORY_CARE_PROVIDER_SITE_OTHER): Payer: Medicare Other | Admitting: Obstetrics and Gynecology

## 2022-05-23 ENCOUNTER — Encounter: Payer: Self-pay | Admitting: Obstetrics and Gynecology

## 2022-05-23 ENCOUNTER — Other Ambulatory Visit (HOSPITAL_COMMUNITY)
Admission: RE | Admit: 2022-05-23 | Discharge: 2022-05-23 | Disposition: A | Discharge status: Home / Self Care | Payer: Medicare Other | Attending: Obstetrics and Gynecology | Admitting: Obstetrics and Gynecology

## 2022-05-23 VITALS — BP 112/64 | HR 61 | Ht 58.66 in | Wt 110.0 lb

## 2022-05-23 DIAGNOSIS — R82998 Other abnormal findings in urine: Secondary | ICD-10-CM

## 2022-05-23 DIAGNOSIS — N3281 Overactive bladder: Secondary | ICD-10-CM | POA: Diagnosis not present

## 2022-05-23 DIAGNOSIS — N393 Stress incontinence (female) (male): Secondary | ICD-10-CM

## 2022-05-23 DIAGNOSIS — R350 Frequency of micturition: Secondary | ICD-10-CM | POA: Insufficient documentation

## 2022-05-23 DIAGNOSIS — N952 Postmenopausal atrophic vaginitis: Secondary | ICD-10-CM

## 2022-05-23 DIAGNOSIS — N812 Incomplete uterovaginal prolapse: Secondary | ICD-10-CM | POA: Diagnosis not present

## 2022-05-23 DIAGNOSIS — E2839 Other primary ovarian failure: Secondary | ICD-10-CM

## 2022-05-23 DIAGNOSIS — N8110 Cystocele, unspecified: Secondary | ICD-10-CM

## 2022-05-23 LAB — POCT URINALYSIS DIPSTICK
Bilirubin, UA: NEGATIVE
Blood, UA: NEGATIVE
Glucose, UA: NEGATIVE
Ketones, UA: NEGATIVE
Nitrite, UA: NEGATIVE
Protein, UA: NEGATIVE
Spec Grav, UA: 1.015 (ref 1.010–1.025)
Urobilinogen, UA: 0.2 E.U./dL
pH, UA: 6.5 (ref 5.0–8.0)

## 2022-05-23 MED ORDER — ESTRADIOL 0.1 MG/GM VA CREA: 0.5000 g | TOPICAL_CREAM | VAGINAL | 11 refills | Status: DC

## 2022-05-23 NOTE — Patient Instructions (Signed)
You have a stage 3 (out of 4) prolapse.  We discussed the fact that it is not life threatening but there are several treatment options. For treatment of pelvic organ prolapse, we discussed options for management including expectant management, conservative management, and surgical management, such as Kegels, a pessary, pelvic floor physical therapy, and specific surgical procedures.

## 2022-05-23 NOTE — Progress Notes (Unsigned)
[facility] New Patient Evaluation and Consultation  Referring Provider: Everett Graff, MD PCP: [name], NP Date of Service: 05/23/2022  SUBJECTIVE Chief Complaint: New Patient (Initial Visit) ([name] is a 89 y.o. female here for a consult for stress incontinence.)  History of Present Illness: [name] is a 89 y.o. Black or African-American female seen in consultation at the request of Dr. Mancel Bale for evaluation of incontinence.    Review of records from Dr Mancel Bale significant for: Has attended pelvic PT for leakage. Also has been on [place] Has a pessary in place for her prolapse.   Urinary Symptoms: Leaks urine with cough/ sneeze, laughing, exercise, lifting, going from sitting to standing, with a full bladder, with movement to the bathroom, with urgency, and without sensation Leaks 5-6 time(s) per day. UUI > SUI Pad use: 5 pads per day, also wears depends at night.   She is bothered by her UI symptoms. Has tried: Myrbetriq, [name] and vesicare but did not see much difference so has stopped with the medication.   Day time voids 6+.  Nocturia: 4-6 times per night to void. Voiding dysfunction: she does not empty her bladder well.  does not use a catheter to empty bladder.  When urinating, she feels a weak stream and the need to urinate multiple times in a row Drinks: 1 cup ginger tea, 1.5 bottles water per day  UTIs: 1 UTI's in the last year.  Reports this was a few weeks ago, diagnosed with her PCP (E.Coli). Repeat culture 05/16/22 was negative. She feels she still has pain with urination despite antibiotics.  Denies history of blood in urine and kidney or bladder stones  Pelvic Organ Prolapse Symptoms:                  She Admits to a feeling of a bulge the vaginal area. It has been present for 1+ years.  She Denies seeing a bulge.  This bulge is bothersome. A pessary was tried and removed due abrasions but she felt like it was  helping her. She has tried a ring and gellhorn pessaries.   Bowel Symptom: Bowel movements: usually every day Stool consistency: soft  Straining: yes.  Splinting: yes.  Incomplete evacuation: no.  Denies accidental bowel leakage / fecal incontinence Bowel regimen: diet, stool softener, and miralax  Sexual Function Sexually active: no.  Pain with sex: No  Pelvic Pain Admits to pelvic pain Location: below the navel Pain occurs: during urination Improved by: urination Worsened by: full bladder   Past Medical History:  Past Medical History:  Diagnosis Date   Arthritis    Cancer ([place])    left breast cancer    Cataracts, bilateral    removed by surgery   CHF (congestive heart failure) ([place])    PACEMAKER & DEFIB   Complication of anesthesia    hypotensive after back surgery in 2006   Depression    Dyslipidemia    Fainted 04/21/06   AT [facility]   GERD (gastroesophageal reflux disease)    Headache(784.0)    Hearing loss    bilateral hearing aids   HLD (hyperlipidemia)    diet controlled    Hypertension    Hypothyroidism    ICD (implantable cardiac defibrillator) in place    pt has pacer/icd   ICD (implantable cardiac defibrillator), biventricular, in situ    LBBB (left bundle branch block)    Memory loss    Nonischemic cardiomyopathy ([place])  Normal coronary arteries    s/p cardiac cath 2007   Pacemaker    ICD [employer]   Syncope    Systolic CHF [facility])    Vertigo    Wears glasses      Past Surgical History:   Past Surgical History:  Procedure Laterality Date   BACK SURGERY     lumbar fusion    BREAST LUMPECTOMY Left 2008   BREAST SURGERY  2000   LUMP REMOVAL. STAGE 1 CANCER   CARDIAC CATHETERIZATION     CATARACT EXTRACTION     COLONOSCOPY     EYE SURGERY     IMPLANTABLE CARDIOVERTER DEFIBRILLATOR GENERATOR CHANGE N/A 12/18/2012   Procedure: IMPLANTABLE CARDIOVERTER DEFIBRILLATOR GENERATOR CHANGE;  Surgeon: [name], MD;  Location: [facility] CATH  LAB;  Service: Cardiovascular;  Laterality: N/A;   JOINT REPLACEMENT  06/14/01   right   LUMBAR FUSION  2006   MASS EXCISION  11/08/2011   Procedure: EXCISION MASS;  Surgeon: [name], MD;  Location: [facility] ORS;  Service: General;  Laterality: Left;  Excision Left Thigh Mass   MASTECTOMY W/ SENTINEL NODE BIOPSY Left 06/04/2019   Procedure: LEFT MASTECTOMY WITH SENTINEL LYMPH NODE BIOPSY;  Surgeon: [name], MD;  Location: [facility];  Service: General;  Laterality: Left;   MASTECTOMY, PARTIAL  2008   GOT PACEMAKER AND DEFIB AT THAT TIME   PACEMAKER INSERTION  04/23/06   TOTAL KNEE ARTHROPLASTY  05/17/01   RIGHT KNEE   TOTAL KNEE ARTHROPLASTY Left 11/29/2014   Procedure: TOTAL LEFT KNEE ARTHROPLASTY;  Surgeon: Paralee Cancel, MD;  Location: [facility];  Service: Orthopedics;  Laterality: Left;     Past OB/GYN History: OB History  Gravida Para Term Preterm AB Living  2 1 1   1 1  $ SAB IAB Ectopic Multiple Live Births          1    # Outcome Date GA Lbr Len/2nd Weight Sex Delivery Anes PTL Lv  2 Term      Vag-Spont     1 AB             Menopausal: Denies vaginal bleeding since menopause Any history of abnormal pap smears: no.   Medications: She has a current medication list which includes the following prescription(s): acetaminophen, acetaminophen, albuterol, ascorbic acid, bupropion, carvedilol, vitamin d3, diclofenac sodium, duloxetine, estradiol, famotidine, fluticasone, furosemide, levothyroxine, lidocaine, lubiprostone, magnesium, mirabegron er, NONFORMULARY OR COMPOUNDED ITEM, omega-3 fatty acids, omeprazole, polyethylene glycol powder, pregabalin, senna-docusate, solifenacin, spironolactone, telmisartan, and vitamin e.   Allergies: Patient is allergic to iodine, shellfish allergy, memantine, aspirin, and codeine.   Social History:  Social History   Tobacco Use   Smoking status: Never    Passive exposure: Never   Smokeless tobacco: Never  Vaping Use   Vaping Use: Never used   Substance Use Topics   Alcohol use: No   Drug use: No    Relationship status: widowed She lives with daughter.   She is not employed. Regular exercise: Yes: zumba and stretching History of abuse: No  Family History:   Family History  Problem Relation Age of Onset   Hypertension Mother    Arthritis Mother    Hypertension Father    Hypertension Brother    Hypertension Brother      Review of Systems: Review of Systems  Constitutional:  Positive for malaise/fatigue. Negative for fever and weight loss.  Respiratory:  Negative for cough, shortness of breath and wheezing.   Cardiovascular:  Positive for leg swelling. Negative for chest pain and palpitations.  Gastrointestinal:  Negative for abdominal pain and blood in stool.  Genitourinary:  Positive for dysuria.  Musculoskeletal:  Positive for myalgias.  Skin:  Negative for rash.  Neurological:  Positive for headaches. Negative for dizziness.  Endo/Heme/Allergies:  Does not bruise/bleed easily.  Psychiatric/Behavioral:  Positive for depression. The patient is not nervous/anxious.      OBJECTIVE Physical Exam: Vitals:   05/23/22 1420  BP: 112/64  Pulse: 61  Weight: 110 lb (49.9 kg)  Height: 4' 10.66" (1.49 m)    Physical Exam Constitutional:      General: She is not in acute distress. Pulmonary:     Effort: Pulmonary effort is normal.  Abdominal:     General: There is no distension.     Palpations: Abdomen is soft.     Tenderness: There is no abdominal tenderness. There is no rebound.  Musculoskeletal:        General: No swelling. Normal range of motion.  Skin:    General: Skin is warm and dry.     Findings: No rash.  Neurological:     Mental Status: She is alert and oriented to person, place, and time.  Psychiatric:        Mood and Affect: Mood normal.        Behavior: Behavior normal.      GU / Detailed Urogynecologic Evaluation:  Pelvic Exam: Normal external female genitalia; Bartholin's and Skene's  glands normal in appearance; urethral meatus normal in appearance, no urethral masses or discharge.   CST: negative  Speculum exam reveals normal vaginal mucosa with atrophy. Cervix normal appearance. Uterus normal single, nontender. Adnexa no mass, fullness, tenderness.     Pelvic floor strength I/V  Pelvic floor musculature: Right levator non-tender, Right obturator non-tender, Left levator non-tender, Left obturator non-tender  POP-Q:   POP-Q  1.5                                            Aa   1.5                                           Ba  -4                                              C   4                                            Gh  3                                            Pb  7  tvl   -2                                            Ap  -2                                            Bp  -4.5                                              D   Patient's pessary replaced at the end of the exam. Appeared to bulge slightly at the introitus but patient states it is comfortable.    Rectal Exam:  Normal external rectum  Post-Void Residual (PVR) by Bladder Scan: In order to evaluate bladder emptying, we discussed obtaining a postvoid residual and she agreed to this procedure.  Procedure: The ultrasound unit was placed on the patient's abdomen in the suprapubic region after the patient had voided. A PVR of 66 ml was obtained by bladder scan.  Laboratory Results: POC urine: small leukocytes, negative nitrites   ASSESSMENT AND PLAN Ms. Dunkley is a 89 y.o. with:  1. Overactive bladder   2. Urinary frequency   3. SUI (stress urinary incontinence, female)   4. Uterovaginal prolapse, incomplete   5. Prolapse of anterior vaginal wall   6. Vaginal atrophy   7. Leukocytes in urine    OAB - We discussed the symptoms of overactive bladder (OAB), which include urinary urgency, urinary frequency, nocturia, with or without  urge incontinence.  While we do not know the exact etiology of OAB, several treatment options exist. We discussed management including behavioral therapy (decreasing bladder irritants, urge suppression strategies, timed voids, bladder retraining), physical therapy, medication; for refractory cases posterior tibial nerve stimulation, sacral neuromodulation, and intravesical botulinum toxin injection.  - She has tried several medications (myrbetriq, gemtesa, vesicare). Discussed option of tropsium but they do not want to add another medication at this time.  -not eligible for PTNS due to pacemaker  2. SUI - For treatment of stress urinary incontinence,  non-surgical options include expectant management, weight loss, physical therapy, as well as a pessary.  They are not interested in surgical options.  - Can try an incontinence type pessary.   3. Stage III anterior, Stage I posterior, Stage I apical prolapse - For treatment of pelvic organ prolapse, we discussed options for management including expectant management, conservative management, and surgical management, such as Kegels, a pessary, pelvic floor physical therapy, and specific surgical procedures. They are not interested in surgery.  - Has tried several ring and gellhorn pessaries. Current pessary appears slightly large which could be contributing to abrasions in the vagina. Will have her return for a pessary fitting to see if we can find something that fits better for her. May need more frequent pessary changes.  - Recommended continuing vaginal estrogen twice a week. New Rx for estrace provided.   4. Leukocytes in urine - recently treated for a UTI and having symptoms again. Will send for culture.   Return for pessary fitting.   Stacey Folds, MD

## 2022-05-24 ENCOUNTER — Encounter: Payer: Self-pay | Admitting: Obstetrics and Gynecology

## 2022-05-24 LAB — URINE CULTURE: Culture: <10000 — AB

## 2022-05-28 ENCOUNTER — Encounter: Payer: Self-pay | Admitting: *Deleted

## 2022-05-30 ENCOUNTER — Ambulatory Visit: Payer: Medicare Other | Admitting: Obstetrics and Gynecology

## 2022-06-05 ENCOUNTER — Encounter: Payer: Medicare Other | Admitting: Nurse Practitioner

## 2022-06-05 ENCOUNTER — Encounter: Payer: Medicare Other | Admitting: Orthopedic Surgery

## 2022-06-06 ENCOUNTER — Ambulatory Visit (INDEPENDENT_AMBULATORY_CARE_PROVIDER_SITE_OTHER): Payer: Medicare Other | Admitting: Family

## 2022-06-06 ENCOUNTER — Encounter: Payer: Self-pay | Admitting: Family

## 2022-06-06 VITALS — BP 100/62 | HR 82 | Temp 97.5°F | Resp 18 | Ht 58.66 in | Wt 112.0 lb

## 2022-06-06 DIAGNOSIS — R0609 Other forms of dyspnea: Secondary | ICD-10-CM | POA: Diagnosis not present

## 2022-06-06 MED ORDER — FUROSEMIDE 20 MG PO TABS: 20.0000 mg | ORAL_TABLET | Freq: Every day | ORAL | 3 refills | Status: AC | PRN

## 2022-06-06 NOTE — Progress Notes (Signed)
This encounter was created in error - please disregard.

## 2022-06-06 NOTE — Progress Notes (Signed)
Provider: Taquana Bartley FNP-C  [name], NP  Patient Care Team: [name], NP as PCP - General (Geriatric Medicine) [name], MD as PCP - Electrophysiology (Cardiology) [name], MD as Consulting Physician (General Surgery) Paralee Cancel, MD as Consulting Physician (Orthopedic Surgery) Marica Otter, [place] (Optometry) Marcial Pacas, MD as Consulting Physician (Neurology) [name], MD as Consulting Physician (Cardiology) Ricard Dillon, MD (Psychiatry)  Extended Emergency Contact Information Primary Emergency Contact: Juel Burrow Address: [address]          [place], [place] [address] Johnnette Litter of [place] Phone: [phone] Relation: Daughter Secondary Emergency Contact: [facility] Address: [address]          [place], [place] [address] Johnnette Litter of [place] Phone: [phone] Relation: Friend  Code Status:  DNR Goals of care: Advanced Directive information    05/18/2022    3:02 PM  Advanced Directives  Does Patient Have a Medical Advance Directive? Yes  Type of Paramedic of [place];Living will;Out of facility DNR (pink MOST or yellow form)  Copy of [place] in Chart? Yes - validated most recent copy scanned in chart (See row information)  Pre-existing out of facility DNR order (yellow form or pink MOST form) Yellow form placed in chart (order not valid for inpatient use)     Chief Complaint  Patient presents with   Acute Visit    Patient is here for SOB and fatigue for the last 5 days    HPI:  Pt is a 89 y.o. female seen today for an acute visit for evaluation of shortness of breath and fatigue for the past 5 days.Ha had slight leg edema.Has not taken Furosemide Daughter not sure whether she has any Furosemide at home.she denies any headache,dizziness,vision changes,chest tightness,palpitation,chest pain or orthopnea.also denies any fever,chills or exposure to  sick person with COVID-19.     Past Medical History:  Diagnosis Date   Arthritis    Cancer ([place])    left breast cancer    Cataracts, bilateral    removed by surgery   CHF (congestive heart failure) ([place])    PACEMAKER & DEFIB   Complication of anesthesia    hypotensive after back surgery in 2006   Depression    Dyslipidemia    Fainted 04/21/06   AT [facility]   GERD (gastroesophageal reflux disease)    Headache(784.0)    Hearing loss    bilateral hearing aids   HLD (hyperlipidemia)    diet controlled    Hypertension    Hypothyroidism    ICD (implantable cardiac defibrillator) in place    pt has pacer/icd   ICD (implantable cardiac defibrillator), biventricular, in situ    LBBB (left bundle branch block)    Memory loss    Nonischemic cardiomyopathy ([place])    Normal coronary arteries    s/p cardiac cath 2007   Pacemaker    ICD [employer]   Syncope    Systolic CHF [facility])    Vertigo    Wears glasses    Past Surgical History:  Procedure Laterality Date   BACK SURGERY     lumbar fusion    BREAST LUMPECTOMY Left 2008   BREAST SURGERY  2000   LUMP REMOVAL. STAGE 1 CANCER   CARDIAC CATHETERIZATION     CATARACT EXTRACTION     COLONOSCOPY     EYE SURGERY     IMPLANTABLE CARDIOVERTER DEFIBRILLATOR GENERATOR CHANGE N/A 12/18/2012  Procedure: IMPLANTABLE CARDIOVERTER DEFIBRILLATOR GENERATOR CHANGE;  Surgeon: [name], MD;  Location: [facility] CATH LAB;  Service: Cardiovascular;  Laterality: N/A;   JOINT REPLACEMENT  06/14/01   right   LUMBAR FUSION  2006   MASS EXCISION  11/08/2011   Procedure: EXCISION MASS;  Surgeon: [name], MD;  Location: [facility] ORS;  Service: General;  Laterality: Left;  Excision Left Thigh Mass   MASTECTOMY W/ SENTINEL NODE BIOPSY Left 06/04/2019   Procedure: LEFT MASTECTOMY WITH SENTINEL LYMPH NODE BIOPSY;  Surgeon: [name], MD;  Location: [place];  Service: General;  Laterality: Left;   MASTECTOMY, PARTIAL  2008   GOT PACEMAKER AND DEFIB AT THAT  TIME   PACEMAKER INSERTION  04/23/06   TOTAL KNEE ARTHROPLASTY  05/17/01   RIGHT KNEE   TOTAL KNEE ARTHROPLASTY Left 11/29/2014   Procedure: TOTAL LEFT KNEE ARTHROPLASTY;  Surgeon: Paralee Cancel, MD;  Location: [facility] ORS;  Service: Orthopedics;  Laterality: Left;    Allergies  Allergen Reactions   Iodine Shortness Of Breath    Iodine contrast, CHF , SOB   Shellfish Allergy Shortness Of Breath   Memantine     Malaise, fogginess/ couldn't think   Aspirin Nausea And Vomiting   Codeine Nausea And Vomiting    Outpatient Encounter Medications as of 06/06/2022  Medication Sig   acetaminophen (TYLENOL) 500 MG tablet Take 500 mg by mouth 2 (two) times daily.  in the morning and afternoon   acetaminophen (TYLENOL) 650 MG CR tablet Take 650 mg by mouth at bedtime.   albuterol (VENTOLIN HFA) 108 (90 Base) MCG/ACT inhaler Inhale 2 puffs into the lungs every 6 (six) hours as needed for wheezing or shortness of breath.   ascorbic acid (VITAMIN C) 500 MG tablet Take 500 mg by mouth daily.   buPROPion (WELLBUTRIN XL) 150 MG 24 hr tablet Take 1 tablet (150 mg total) by mouth daily.   carvedilol (COREG) 3.125 MG tablet TAKE 1 TABLET BY MOUTH TWICE  DAILY   Cholecalciferol (VITAMIN D3) 50 MCG (2000 UT) TABS Take 2,000 Units by mouth daily.    diclofenac Sodium (VOLTAREN) 1 % GEL Apply 4 g topically 4 (four) times daily.   DULoxetine (CYMBALTA) 60 MG capsule Take 60 mg by mouth daily.   estradiol (ESTRACE) 0.1 MG/GM vaginal cream Place 0.5 g vaginally 2 (two) times a week. Place 0.5g nightly for two weeks then twice a week after   famotidine (PEPCID) 10 MG tablet Take 10 mg by mouth as needed for heartburn or indigestion.   fluticasone (FLONASE) 50 MCG/ACT nasal spray Place 2 sprays into both nostrils daily.   furosemide (LASIX) 20 MG tablet Take 1 tablet (20 mg total) by mouth daily as needed.   levothyroxine (SYNTHROID, LEVOTHROID) 100 MCG tablet Take 1 tablet (100 mcg total) by mouth daily before breakfast.    Lidocaine 4 % PTCH Apply 1 patch topically every 12 (twelve) hours. Apply to back   lubiprostone (AMITIZA) 8 MCG capsule TAKE ONE CAPSULE BY MOUTH TWICE DAILY WITH A MEAL   Magnesium 250 MG TABS Take 250 mg by mouth daily.   mirabegron ER (MYRBETRIQ) 50 MG TB24 tablet Take 1 tablet (50 mg total) by mouth daily.   NONFORMULARY OR COMPOUNDED ITEM Apply 120 Tubes topically daily. Diclofenac/Cyclobenzaprine/lamotrigine/lidocaine/prilocaine (10480) 2%/2%/6%/5%/1.25% cream QTY: 120 GM SIG: (NEURO) APPLY 1-2 PUMPS (1-2 GMS) TO AFFECTED AREA (S) OR FOCAL POINTS 3 TO 4 TIMES DAILY. RUB IN FOR 2 MINUTES TO ACHIEVE MAX PENETRATION. [place] HANDS WELL.  Omega-3 Fatty Acids (FISH OIL PO) Take 1 capsule by mouth daily.   omeprazole (PRILOSEC) 40 MG capsule TAKE 1 CAPSULE BY MOUTH DAILY   polyethylene glycol powder (GLYCOLAX/MIRALAX) 17 GM/SCOOP powder Take 1 Container by mouth daily.   pregabalin (LYRICA) 25 MG capsule Take 2 capsules (50 mg total) by mouth at bedtime. For nerve pain/back pain   senna-docusate (SENOKOT S) 8.6-50 MG tablet Take 2 tablets by mouth daily.   solifenacin (VESICARE) 5 MG tablet Take 1 tablet by mouth daily.   spironolactone (ALDACTONE) 25 MG tablet TAKE 1 TABLET BY MOUTH IN THE  MORNING   telmisartan (MICARDIS) 20 MG tablet Take 1 tablet (20 mg total) by mouth daily.   Vitamin E 400 units TABS Take 400 Units by mouth daily.    No facility-administered encounter medications on file as of 06/06/2022.    Review of Systems  Constitutional:  Negative for appetite change, chills, fatigue, fever and unexpected weight change.  HENT:  Negative for congestion, ear discharge, ear pain, hearing loss, nosebleeds, postnasal drip, rhinorrhea, sinus pressure, sinus pain, sneezing, sore throat, tinnitus and trouble swallowing.   Eyes:  Negative for pain, discharge, redness, itching and visual disturbance.  Respiratory:  Negative for cough, chest tightness, shortness of breath and wheezing.    Cardiovascular:  Positive for leg swelling. Negative for chest pain and palpitations.  Gastrointestinal:  Negative for abdominal distention, abdominal pain, blood in stool, constipation, diarrhea, nausea and vomiting.  Genitourinary:  Negative for difficulty urinating, dysuria, flank pain, frequency and urgency.  Musculoskeletal:  Negative for arthralgias, back pain, gait problem, joint swelling, myalgias, neck pain and neck stiffness.  Skin:  Negative for color change, pallor and rash.  Neurological:  Negative for dizziness, syncope, speech difficulty, weakness, light-headedness, numbness and headaches.    Immunization History  Administered Date(s) Administered   Fluad Quad(high Dose 65+) 01/28/2020, 01/17/2021   Influenza, High Dose Seasonal PF 01/16/2018, 12/08/2018, 01/12/2022   Influenza-Unspecified 12/08/2013, 01/10/2015, 12/09/2015, 01/12/2017, 01/17/2021   PFIZER(Purple Top)SARS-COV-2 Vaccination 05/23/2019, 06/15/2019, 12/23/2019, 08/30/2020   PNEUMOCOCCAL CONJUGATE-20 01/05/2022   Pfizer Covid-19 Vaccine Bivalent Booster 36yr & up 02/03/2021, 01/12/2022   Pneumococcal Conjugate-13 04/09/2012   Pneumococcal Polysaccharide-23 02/28/2016   Respiratory Syncytial Virus Vaccine,Recomb Aduvanted(Arexvy) 01/05/2022   Tdap 01/07/2013   Zoster Recombinat (Shingrix) 02/03/2021, 06/26/2021   Zoster, Live 08/05/2014, 02/03/2021   Pertinent  Health Maintenance Due  Topic Date Due   MAMMOGRAM  04/04/2023   INFLUENZA VACCINE  Completed   DEXA SCAN  Completed      01/22/2022   11:25 AM 01/24/2022    1:05 PM 02/23/2022    2:53 PM 05/04/2022   10:36 AM 05/16/2022    2:29 PM  Fall Risk  Falls in the past year? 0 0 0 0 0  Was there an injury with Fall?  0 0 0 0  Fall Risk Category Calculator  0 0 0 0  Fall Risk Category (Retired)  Low Low    (RETIRED) Patient Fall Risk Level  Low fall risk Low fall risk    Patient at Risk for Falls Due to   No Fall Risks No Fall Risks No Fall Risks   Fall risk Follow up  Falls evaluation completed Falls evaluation completed Falls evaluation completed Falls evaluation completed   Functional Status Survey:    Vitals:   06/06/22 1451  BP: 100/62  Pulse: 82  Resp: 18  Temp: (!) 97.5 F (36.4 C)  SpO2: (!) 85%  Weight: 112 lb (50.8 kg)  Height: 4' 10.66" (1.49 m)   Body mass index is 22.88 kg/m. Physical Exam Vitals reviewed.  Constitutional:      General: She is not in acute distress.    Appearance: Normal appearance. She is normal weight. She is not ill-appearing or diaphoretic.  HENT:     Head: Normocephalic.     Right Ear: Tympanic membrane, ear canal and external ear normal. There is no impacted cerumen.     Left Ear: Tympanic membrane, ear canal and external ear normal. There is no impacted cerumen.     Nose: Nose normal. No congestion or rhinorrhea.     Mouth/Throat:     Mouth: Mucous membranes are moist.     Pharynx: Oropharynx is clear. No oropharyngeal exudate or posterior oropharyngeal erythema.  Eyes:     General: No scleral icterus.       Right eye: No discharge.        Left eye: No discharge.     Conjunctiva/sclera: Conjunctivae normal.     Pupils: Pupils are equal, round, and reactive to light.  Neck:     Vascular: No carotid bruit.  Cardiovascular:     Rate and Rhythm: Normal rate and regular rhythm.     Pulses: Normal pulses.     Heart sounds: Normal heart sounds. No murmur heard.    No friction rub. No gallop.  Pulmonary:     Effort: Pulmonary effort is normal. No respiratory distress.     Breath sounds: Normal breath sounds. No wheezing, rhonchi or rales.  Chest:     Chest wall: No tenderness.  Abdominal:     General: Bowel sounds are normal. There is no distension.     Palpations: Abdomen is soft. There is no mass.     Tenderness: There is no abdominal tenderness. There is no right CVA tenderness, left CVA tenderness, guarding or rebound.  Musculoskeletal:        General: No swelling or  tenderness. Normal range of motion.     Cervical back: Normal range of motion. No rigidity or tenderness.     Right lower leg: Edema present.     Left lower leg: Edema present.     Comments: 1-2+ bilat.LE edema   Lymphadenopathy:     Cervical: No cervical adenopathy.  Skin:    General: Skin is warm and dry.     Coloration: Skin is not pale.     Findings: No bruising, erythema or rash.  Neurological:     Mental Status: She is alert and oriented to person, place, and time.     Cranial Nerves: No cranial nerve deficit.     Sensory: No sensory deficit.     Motor: No weakness.     Coordination: Coordination normal.     Gait: Gait abnormal.  Psychiatric:        Mood and Affect: Mood normal.        Speech: Speech normal.        Behavior: Behavior normal.     Labs reviewed: Recent Labs    10/23/21 1528 12/04/21 1016 12/22/21 0912  NA 132* 134* 135  K 4.3 4.7 4.7  CL 97* 99 101  CO2 '27 27 28  '$ GLUCOSE 116 57* 73  BUN '21 19 19  '$ CREATININE 0.81 0.77 0.73  CALCIUM 10.9* 10.3 10.1   Recent Labs    06/20/21 0945 07/07/21 0951 12/04/21 1016 12/22/21 0912  AST '22 29 22 29  '$ ALT '18 18 20 23  '$ ALKPHOS 60  --   --  69  BILITOT 0.4 0.4 0.3 0.4  PROT 7.0 7.2 6.9 6.5  ALBUMIN 4.0  --   --  3.6   Recent Labs    12/04/21 1016 12/22/21 0912 01/17/22 1116  WBC 5.3 5.9 7.1  NEUTROABS 3,466 3.7 5,098  HGB 12.0 11.8* 12.3  HCT 36.4 35.2* 38.0  MCV 90.1 88.7 90.7  PLT 144 151 144   Lab Results  Component Value Date   TSH 0.81 06/02/2021   No results found for: "HGBA1C" Lab Results  Component Value Date   CHOL 171 11/17/2020   HDL 63 11/17/2020   LDLCALC 90 11/17/2020   TRIG 86 11/17/2020   CHOLHDL 2.7 11/17/2020    Significant Diagnostic Results in last 30 days:  No results found.  Assessment/Plan  Dyspnea on exertion Has had 2 lbs weight gain over 2 weeks. Bilateral lower extremities edema 1-2+ edema noted. - Advised to take Furosemide 20 mg tablet daily for 3  days then change to PRN for weight gain,edema or shortness of breath.on potassium sparing spirolactone.  - Keep legs elevated when seated to keep swelling down - check weight at least three times per week and notify provider for any abrupt weight gain > 3 lbs  - Reduce salt intake in diet  - furosemide (LASIX) 20 MG tablet; Take 1 tablet (20 mg total) by mouth daily as needed.  Dispense: 90 tablet; Refill: 3  Family/ staff Communication: Reviewed plan of care with patient and daughter verbalized understanding  Labs/tests ordered: None   Next Appointment: Return if symptoms worsen or fail to improve.    Sandrea Hughs, NP

## 2022-06-06 NOTE — Patient Instructions (Signed)
-   Keep legs elevated when seated to keep swelling down  ?- check weight at least three times per week and notify provider for any abrupt weight gain > 3 lbs  ?- Reduce salt intake in diet  ? ?

## 2022-06-07 ENCOUNTER — Encounter: Payer: Self-pay | Admitting: Obstetrics and Gynecology

## 2022-06-07 ENCOUNTER — Ambulatory Visit (INDEPENDENT_AMBULATORY_CARE_PROVIDER_SITE_OTHER): Payer: Medicare Other | Admitting: Obstetrics and Gynecology

## 2022-06-07 VITALS — BP 119/67 | HR 79

## 2022-06-07 DIAGNOSIS — N8110 Cystocele, unspecified: Secondary | ICD-10-CM | POA: Diagnosis not present

## 2022-06-07 DIAGNOSIS — N812 Incomplete uterovaginal prolapse: Secondary | ICD-10-CM | POA: Diagnosis not present

## 2022-06-07 DIAGNOSIS — N393 Stress incontinence (female) (male): Secondary | ICD-10-CM | POA: Diagnosis not present

## 2022-06-07 DIAGNOSIS — N952 Postmenopausal atrophic vaginitis: Secondary | ICD-10-CM

## 2022-06-07 NOTE — Patient Instructions (Addendum)
Follow up in 3 weeks for pessary follow up.   Continue estrogen cream twice a week.

## 2022-06-07 NOTE — Progress Notes (Signed)
[name]   Subjective:     Chief Complaint: Pessary Check ([name] is a 89 y.o. female is here for pessary check.)  History of Present Illness: [name] is a 89 y.o. female with stage III pelvic organ prolapse and stress incontinence who presents today for a pessary fitting.    Past Medical History: Patient  has a past medical history of Arthritis, Cancer ([name]), Cataracts, bilateral, CHF (congestive heart failure) ([name]), Complication of anesthesia, Depression, Dyslipidemia, Fainted (04/21/06), GERD (gastroesophageal reflux disease), Headache(784.0), Hearing loss, HLD (hyperlipidemia), Hypertension, Hypothyroidism, ICD (implantable cardiac defibrillator) in place, ICD (implantable cardiac defibrillator), biventricular, in situ, LBBB (left bundle branch block), Memory loss, Nonischemic cardiomyopathy ([name]), Normal coronary arteries, Pacemaker, Syncope, Systolic CHF ([name]), Vertigo, and Wears glasses.   Past Surgical History: She  has a past surgical history that includes Total knee arthroplasty (05/17/01); Lumbar fusion (2006); Mastectomy, partial (2008); Breast surgery (2000); Joint replacement (06/14/01); Pacemaker insertion (04/23/06); Back surgery; Cardiac catheterization; Mass excision (11/08/2011); implantable cardioverter defibrillator generator change (N/A, 12/18/2012); Cataract extraction; Eye surgery; Total knee arthroplasty (Left, 11/29/2014); Breast lumpectomy (Left, 2008); Colonoscopy; and Mastectomy w/ sentinel node biopsy (Left, 06/04/2019).   Medications: She has a current medication list which includes the following prescription(s): acetaminophen, acetaminophen, albuterol, ascorbic acid, bupropion, carvedilol, vitamin d3, diclofenac sodium, duloxetine, estradiol, famotidine, fluticasone, furosemide, levothyroxine, lidocaine, lubiprostone, magnesium, mirabegron er, NONFORMULARY OR COMPOUNDED ITEM, omega-3 fatty acids, omeprazole, polyethylene glycol  powder, pregabalin, senna-docusate, solifenacin, spironolactone, telmisartan, and vitamin e.   Allergies: Patient is allergic to iodine, shellfish allergy, memantine, aspirin, and codeine.   Social History: Patient  reports that she has never smoked. She has never been exposed to tobacco smoke. She has never used smokeless tobacco. She reports that she does not drink alcohol and does not use drugs.      Objective:    BP 119/67   Pulse 79   LMP  (LMP Unknown)  Gen: No apparent distress, A&O x 3. Pelvic Exam: Normal external female genitalia; Bartholin's and Skene's glands normal in appearance; urethral meatus normal in appearance, no urethral masses or discharge.   A size #2 incontinence dish pessary was fitted. It was comfortable, stayed in place with valsalva and was an appropriate size on examination, with one finger fitting between the pessary and the vaginal walls. We tied a string to it and the patient demonstrated proper removal and replacement.      No data to display           Assessment/Plan:    Assessment: [name] is a 89 y.o. with stage III pelvic organ prolapse who presents for a pessary fitting. Plan: She was fitted with a #2 incontinence dish pessary. She will keep the pessary in place until next visit. She will use estrogen.   Follow-up in 3 weeks for a pessary check or sooner as needed.  All questions were answered.    Berton Mount, NP

## 2022-06-08 ENCOUNTER — Ambulatory Visit: Payer: Medicare Other | Admitting: Urology

## 2022-06-21 ENCOUNTER — Other Ambulatory Visit: Payer: Self-pay

## 2022-06-21 DIAGNOSIS — C50912 Malignant neoplasm of unspecified site of left female breast: Secondary | ICD-10-CM

## 2022-06-22 ENCOUNTER — Inpatient Hospital Stay (HOSPITAL_BASED_OUTPATIENT_CLINIC_OR_DEPARTMENT_OTHER): Payer: Medicare Other | Admitting: Hematology

## 2022-06-22 ENCOUNTER — Inpatient Hospital Stay: Payer: Medicare Other | Attending: Nurse Practitioner

## 2022-06-22 VITALS — BP 111/66 | HR 72 | Temp 97.5°F | Resp 16 | Wt 111.1 lb

## 2022-06-22 DIAGNOSIS — R609 Edema, unspecified: Secondary | ICD-10-CM | POA: Diagnosis not present

## 2022-06-22 DIAGNOSIS — R0781 Pleurodynia: Secondary | ICD-10-CM

## 2022-06-22 DIAGNOSIS — C50912 Malignant neoplasm of unspecified site of left female breast: Secondary | ICD-10-CM | POA: Diagnosis not present

## 2022-06-22 DIAGNOSIS — Z9012 Acquired absence of left breast and nipple: Secondary | ICD-10-CM | POA: Diagnosis not present

## 2022-06-22 DIAGNOSIS — M810 Age-related osteoporosis without current pathological fracture: Secondary | ICD-10-CM | POA: Diagnosis not present

## 2022-06-22 DIAGNOSIS — Z170 Estrogen receptor positive status [ER+]: Secondary | ICD-10-CM | POA: Diagnosis not present

## 2022-06-22 DIAGNOSIS — E039 Hypothyroidism, unspecified: Secondary | ICD-10-CM | POA: Diagnosis not present

## 2022-06-22 DIAGNOSIS — G3184 Mild cognitive impairment, so stated: Secondary | ICD-10-CM | POA: Diagnosis not present

## 2022-06-22 DIAGNOSIS — Z79899 Other long term (current) drug therapy: Secondary | ICD-10-CM | POA: Insufficient documentation

## 2022-06-22 DIAGNOSIS — E785 Hyperlipidemia, unspecified: Secondary | ICD-10-CM | POA: Insufficient documentation

## 2022-06-22 DIAGNOSIS — Z171 Estrogen receptor negative status [ER-]: Secondary | ICD-10-CM | POA: Diagnosis not present

## 2022-06-22 LAB — CBC WITH DIFFERENTIAL (CANCER CENTER ONLY)
Abs Immature Granulocytes: 0.04 10*3/uL (ref 0.00–0.07)
Basophils Absolute: 0.1 10*3/uL (ref 0.0–0.1)
Basophils Relative: 2 %
Eosinophils Absolute: 0.3 10*3/uL (ref 0.0–0.5)
Eosinophils Relative: 4 %
HCT: 36.9 % (ref 36.0–46.0)
Hemoglobin: 12.2 g/dL (ref 12.0–15.0)
Immature Granulocytes: 1 %
Lymphocytes Relative: 21 %
Lymphs Abs: 1.4 10*3/uL (ref 0.7–4.0)
MCH: 28.7 pg (ref 26.0–34.0)
MCHC: 33.1 g/dL (ref 30.0–36.0)
MCV: 86.8 fL (ref 80.0–100.0)
Monocytes Absolute: 0.7 10*3/uL (ref 0.1–1.0)
Monocytes Relative: 10 %
Neutro Abs: 4.2 10*3/uL (ref 1.7–7.7)
Neutrophils Relative %: 62 %
Platelet Count: 150 10*3/uL (ref 150–400)
RBC: 4.25 MIL/uL (ref 3.87–5.11)
RDW: 16.6 % — ABNORMAL HIGH (ref 11.5–15.5)
WBC Count: 6.7 10*3/uL (ref 4.0–10.5)
nRBC: 0 % (ref 0.0–0.2)

## 2022-06-22 LAB — CMP (CANCER CENTER ONLY)
ALT: 16 U/L (ref 0–44)
AST: 26 U/L (ref 15–41)
Albumin: 3.6 g/dL (ref 3.5–5.0)
Alkaline Phosphatase: 89 U/L (ref 38–126)
Anion gap: 5 (ref 5–15)
BUN: 18 mg/dL (ref 8–23)
CO2: 26 mmol/L (ref 22–32)
Calcium: 10.1 mg/dL (ref 8.9–10.3)
Chloride: 102 mmol/L (ref 98–111)
Creatinine: 0.76 mg/dL (ref 0.44–1.00)
GFR, Estimated: >60 mL/min (ref >60)
Glucose, Bld: 123 mg/dL — ABNORMAL HIGH (ref 70–99)
Potassium: 4.6 mmol/L (ref 3.5–5.1)
Sodium: 133 mmol/L — ABNORMAL LOW (ref 135–145)
Total Bilirubin: 0.4 mg/dL (ref 0.3–1.2)
Total Protein: 7.8 g/dL (ref 6.5–8.1)

## 2022-06-22 NOTE — Progress Notes (Signed)
HEMATOLOGY/ONCOLOGY CLINIC NOTE  Date of Service 12/22/2021   Patient Care Team: [name], NP as PCP - General (Geriatric Medicine) [name], MD as PCP - Electrophysiology (Cardiology) [name], MD as Consulting Physician (General Surgery) Paralee Cancel, MD as Consulting Physician (Orthopedic Surgery) Marica Otter, [place] (Optometry) Marcial Pacas, MD as Consulting Physician (Neurology) [name], MD as Consulting Physician (Cardiology) Ricard Dillon, MD (Psychiatry)  CHIEF COMPLAINTS/PURPOSE OF CONSULTATION:   Continued evaluation and management of her stage I 1B high-grade triple negative breast cancer  HISTORY OF PRESENTING ILLNESS:   [name] is a wonderful 89 y.o. female who has been referred to [place] by Sherrie Mustache, NP for evaluation and management of easily bruising. The pt reports that she is doing well overall.   The pt reports that she has seen bruises on her arm, and denies bumping into things or any trauma. The pt notes that her bruises are solely located to her upper extremities. She denies concerns for bleeding in her joints, blood in the stools, blood in the urine, nose bleeds or gum bleeds. She notes she has bruised easily for about 2 years. She also notes that she began taking Zoloft about two years ago, and fish oil 4-5 years ago. She is not on any blood thinners. She denies taking any other new medications in the last couple years. The pt denies any thick bruises at any time. The pt denies excessive bleeding with pervious surgeries and dental extractions. The pt denies heavy periods when she was younger.  She has had two knee replacements and took Tramadol for her pain. She denies needing to use this frequently, and takes Tylenol for mild pains.    The pt takes Vitamin E oil for hot flashes, and notes that her Vitamin E oil successfully resolved her hot flashes. She began taking Vitamin E about 18 months ago.   The pt  notes that she has lost about 20 pounds over two years. The pt reports some constipation and denies difficulty swallowing, and weak appetite. The pt denies any dietary restrictions. She continues annual mammograms. She has a history of left sided breast cancer treated with a lumpectomy and radiation.  She notes that her energy levels have also decreased in the last 6 months, and "feels tired all the time." She endorses feeling well rested after taking naps. She notes that she does feel depressed but that taking Zoloft keeps her "afloat." She notes that she feels "medium" enjoyment in her activities. She sees psychiatry every 6 months.  The pt lives with her husband.   Most recent lab results (02/05/18) of CBC w/diff and CMP is as follows: all values are WNL except for PLT at 117k, BUN at 30, Creatinine at 0.96.  On review of systems, pt reports some stable depression, easily bruising on upper extremities, some weight loss, and denies nose bleeds, gum bleeds, blood in the urine, blood in the stools, abdominal pains, leg swelling, and any other symptoms.   On Family Hx the pt denies bleeding disorders.   INTERVAL HISTORY:   [name] is a wonderful 89 y.o.female   here for continued evaluation and management of her stage I 1B high-grade triple negative breast cancer.  Patient was last seen by me on 12/22/2021  and complained of a dry throat and mild coughing. The pt had a mammogram on 05/30/2021 that revealed no evidence of malignancy.  Today, she is accompanied by her daughter. She does  endorse a gradual loss of appetite, though her weight has been fairly stable. She denies any nausea, constipation, abdominal pain, or obvious leg swelling.  She does complain of urinary frequency and leaking. This occurs spontaneously and she is current seeing a urogynecologist regarding this. She was told that the issue is caused by prolapse.. She continues to use estrogen cream which somewhat helps. She  does endorse discomfort passing urine. She notes that she did previously have a UTI in January 2024 and symptoms at that time included a burning sensation, which is different from her current symptoms.  She compliantly takes medication to keep her bowel habits normal. She denies any major medication changes over the last 6 months.   She does complain of rib pain under her left UE which began last week.The pain is below her previous surgical scar as well as along the surgical site. She denies any other symptoms in her right breast and no pain/swelling along the back.  MEDICAL HISTORY:  Past Medical History:  Diagnosis Date   Arthritis    Cancer ([place])    left breast cancer    Cataracts, bilateral    removed by surgery   CHF (congestive heart failure) ([place])    PACEMAKER & DEFIB   Complication of anesthesia    hypotensive after back surgery in 2006   Depression    Dyslipidemia    Fainted 04/21/06   AT [facility]   GERD (gastroesophageal reflux disease)    Headache(784.0)    Hearing loss    bilateral hearing aids   HLD (hyperlipidemia)    diet controlled    Hypertension    Hypothyroidism    ICD (implantable cardiac defibrillator) in place    pt has pacer/icd   ICD (implantable cardiac defibrillator), biventricular, in situ    LBBB (left bundle branch block)    Memory loss    Nonischemic cardiomyopathy ([place])    Normal coronary arteries    s/p cardiac cath 2007   Pacemaker    ICD [employer]   Syncope    Systolic CHF [facility])    Vertigo    Wears glasses     SURGICAL HISTORY: Past Surgical History:  Procedure Laterality Date   BACK SURGERY     lumbar fusion    BREAST LUMPECTOMY Left 2008   BREAST SURGERY  2000   LUMP REMOVAL. STAGE 1 CANCER   CARDIAC CATHETERIZATION     CATARACT EXTRACTION     COLONOSCOPY     EYE SURGERY     IMPLANTABLE CARDIOVERTER DEFIBRILLATOR GENERATOR CHANGE N/A 12/18/2012   Procedure: IMPLANTABLE CARDIOVERTER DEFIBRILLATOR GENERATOR CHANGE;   Surgeon: [name], MD;  Location: [facility] CATH LAB;  Service: Cardiovascular;  Laterality: N/A;   JOINT REPLACEMENT  06/14/01   right   LUMBAR FUSION  2006   MASS EXCISION  11/08/2011   Procedure: EXCISION MASS;  Surgeon: [name], MD;  Location: [facility] ORS;  Service: General;  Laterality: Left;  Excision Left Thigh Mass   MASTECTOMY W/ SENTINEL NODE BIOPSY Left 06/04/2019   Procedure: LEFT MASTECTOMY WITH SENTINEL LYMPH NODE BIOPSY;  Surgeon: [name], MD;  Location: [facility];  Service: General;  Laterality: Left;   MASTECTOMY, PARTIAL  2008   GOT PACEMAKER AND DEFIB AT THAT TIME   PACEMAKER INSERTION  04/23/06   TOTAL KNEE ARTHROPLASTY  05/17/01   RIGHT KNEE   TOTAL KNEE ARTHROPLASTY Left 11/29/2014   Procedure: TOTAL LEFT KNEE ARTHROPLASTY;  Surgeon: Paralee Cancel, MD;  Location: [facility];  Service: Orthopedics;  Laterality: Left;    SOCIAL HISTORY: Social History   Socioeconomic History   Marital status: Married    Spouse name: Not on file   Number of children: 1   Years of education: Masters   Highest education level: Not on file  Occupational History   Occupation: Retired  Tobacco Use   Smoking status: Never    Passive exposure: Never   Smokeless tobacco: Never  Vaping Use   Vaping Use: Never used  Substance and Sexual Activity   Alcohol use: No   Drug use: No   Sexual activity: Not Currently    Birth control/protection: Post-menopausal  Other Topics Concern   Not on file  Social History Narrative   Lives at home with husband.   Right-handed.      As of 07/28/2014   Diet: No special diet   Caffeine: yes, Chocolate, tea and sodas    Married: YES, 1970   House: Yes, 2 stories, 2-3 persons live in home   Pets: No   Current/Past profession: Engineer, mining, Designer, jewellery    Exercise: Yes 2-3 x weekly   Living Will: Yes   DNR: No   POA/HPOA: No      Social Determinants of Health   Financial Resource Strain: Low Risk  (03/27/2017)   Overall Financial Resource  Strain (CARDIA)    Difficulty of Paying Living Expenses: Not hard at all  Food Insecurity: No Food Insecurity (03/27/2017)   Hunger Vital Sign    Worried About Running Out of Food in the Last Year: Never true    [place] in the Last Year: Never true  Transportation Needs: No Transportation Needs (03/27/2017)   PRAPARE - Hydrologist (Medical): No    Lack of Transportation (Non-Medical): No  Physical Activity: Insufficiently Active (03/27/2017)   Exercise Vital Sign    Days of Exercise per Week: 4 days    Minutes of Exercise per Session: 30 min  Stress: Stress Concern Present (03/27/2017)   [place]    Feeling of Stress : To some extent  Social Connections: Socially Integrated (03/27/2017)   Social Connection and Isolation Panel [NHANES]    Frequency of Communication with Friends and Family: More than three times a week    Frequency of Social Gatherings with Friends and Family: Once a week    Attends Religious Services: More than 4 times per year    Active Member of [employer] or Organizations: Yes    Attends Archivist Meetings: Never    Marital Status: Married  Human resources officer Violence: Not At Risk (03/27/2017)   Humiliation, Afraid, Rape, and Kick questionnaire    Fear of Current or Ex-Partner: No    Emotionally Abused: No    Physically Abused: No    Sexually Abused: No    FAMILY HISTORY: Family History  Problem Relation Age of Onset   Hypertension Mother    Arthritis Mother    Hypertension Father    Hypertension Brother    Hypertension Brother     ALLERGIES:  is allergic to iodine, shellfish allergy, memantine, aspirin, and codeine.  MEDICATIONS:  Current Outpatient Medications  Medication Sig Dispense Refill   acetaminophen (TYLENOL) 500 MG tablet Take 500 mg by mouth in the morning and at bedtime. 500 mg in the morning and afternoon     acetaminophen  (TYLENOL) 650 MG CR tablet Take 650 mg by  mouth at bedtime.     albuterol (VENTOLIN HFA) 108 (90 Base) MCG/ACT inhaler Inhale 2 puffs into the lungs every 6 (six) hours as needed for wheezing or shortness of breath. 8 g 6   ascorbic acid (VITAMIN C) 500 MG tablet Take 500 mg by mouth daily.     buPROPion (WELLBUTRIN XL) 150 MG 24 hr tablet Take 1 tablet (150 mg total) by mouth daily.     carvedilol (COREG) 3.125 MG tablet TAKE 1 TABLET BY MOUTH  TWICE DAILY 180 tablet 3   Cholecalciferol (VITAMIN D3) 50 MCG (2000 UT) TABS Take 2,000 Units by mouth daily.      diclofenac Sodium (VOLTAREN) 1 % GEL Apply 4 g topically 4 (four) times daily. 100 g 2   DULoxetine (CYMBALTA) 60 MG capsule Take 60 mg by mouth daily.     famotidine (PEPCID) 10 MG tablet Take 10 mg by mouth as needed for heartburn or indigestion.     fluticasone (FLONASE) 50 MCG/ACT nasal spray Place 2 sprays into both nostrils daily. 16 g 3   furosemide (LASIX) 20 MG tablet Take 1 tablet (20 mg total) by mouth daily as needed. 90 tablet 3   levothyroxine (SYNTHROID, LEVOTHROID) 100 MCG tablet Take 1 tablet (100 mcg total) by mouth daily before breakfast. 90 tablet 3   Lidocaine 4 % PTCH Apply 1 patch topically every 12 (twelve) hours. Apply to back     lubiprostone (AMITIZA) 8 MCG capsule Take 1 capsule (8 mcg total) by mouth 2 (two) times daily with a meal. 60 capsule 1   Magnesium 250 MG TABS Take 250 mg by mouth daily.     meclizine (ANTIVERT) 12.5 MG tablet Take 1 tablet (12.5 mg total) by mouth 3 (three) times daily as needed for dizziness. 30 tablet 1   mirabegron ER (MYRBETRIQ) 50 MG TB24 tablet Take 1 tablet (50 mg total) by mouth daily. 30 tablet 11   NALTREXONE HCL PO Take 4 mg by mouth at bedtime.     NONFORMULARY OR COMPOUNDED ITEM Apply 120 Tubes topically daily. Diclofenac/Cyclobenzaprine/lamotrigine/lidocaine/prilocaine (10480) 2%/2%/6%/5%/1.25% cream QTY: 120 GM SIG: (NEURO) APPLY 1-2 PUMPS (1-2 GMS) TO AFFECTED AREA (S)  OR FOCAL POINTS 3 TO 4 TIMES DAILY. RUB IN FOR 2 MINUTES TO ACHIEVE MAX PENETRATION. [place] HANDS WELL. 1 each 2   Omega-3 Fatty Acids (FISH OIL PO) Take 1 capsule by mouth daily.     omeprazole (PRILOSEC) 40 MG capsule TAKE 1 CAPSULE BY MOUTH DAILY 90 capsule 3   polyethylene glycol powder (GLYCOLAX/MIRALAX) 17 GM/SCOOP powder Take 1 Container by mouth daily.     pregabalin (LYRICA) 25 MG capsule Take 1 capsule (25 mg total) by mouth 3 (three) times daily. 1 in AM and 2 at night- for back/nerve pain 90 capsule 5   senna-docusate (SENOKOT S) 8.6-50 MG tablet Take 2 tablets by mouth daily. 60 tablet 5   sodium phosphate (FLEET) 7-19 GM/118ML ENEM Place 133 mLs (1 enema total) rectally daily as needed for severe constipation. 266 mL 0   spironolactone (ALDACTONE) 25 MG tablet TAKE 1 TABLET BY MOUTH IN  THE MORNING 90 tablet 2   telmisartan (MICARDIS) 20 MG tablet Take 1 tablet (20 mg total) by mouth daily. 90 tablet 0   Vitamin E 400 units TABS Take 400 Units by mouth daily.      No current facility-administered medications for this visit.    REVIEW OF SYSTEMS:    10 Point review of Systems was done is  negative except as noted above.   PHYSICAL EXAMINATION  Vitals:   12/22/21 0938  BP: 116/64  Pulse: 74  Resp: 15  Temp: (!) 97.5 F (36.4 C)  SpO2: 97%   Filed Weights   12/22/21 0938  Weight: 113 lb 4.8 oz (51.4 kg)   .Body mass index is 22.88 kg/m.   GENERAL:alert, in no acute distress and comfortable SKIN: no acute rashes, no significant lesions EYES: conjunctiva are pink and non-injected, sclera anicteric OROPHARYNX: MMM, no exudates, no oropharyngeal erythema or ulceration NECK: supple, no JVD LYMPH:  no palpable lymphadenopathy in the cervical, axillary or inguinal regions LUNGS: clear to auscultation b/l with normal respiratory effort HEART: regular rate & rhythm ABDOMEN:  normoactive bowel sounds , non tender, not distended. Extremity: no pedal edema PSYCH: alert &  oriented x 3 with fluent speech NEURO: no focal motor/sensory deficits    LABORATORY DATA:  I have reviewed the data as listed  .    Latest Ref Rng & Units 12/22/2021    9:12 AM 12/04/2021   10:16 AM 07/07/2021    9:51 AM  CBC  WBC 4.0 - 10.5 K/uL 5.9  5.3  5.3   Hemoglobin 12.0 - 15.0 g/dL 11.8  12.0  12.5   Hematocrit 36.0 - 46.0 % 35.2  36.4  38.1   Platelets 150 - 400 K/uL 151  144  143     .    Latest Ref Rng & Units 12/22/2021    9:12 AM 12/04/2021   10:16 AM 10/23/2021    3:28 PM  CMP  Glucose 70 - 99 mg/dL 73  57  116   BUN 8 - 23 mg/dL 19  19  21    Creatinine 0.44 - 1.00 mg/dL 0.73  0.77  0.81   Sodium 135 - 145 mmol/L 135  134  132   Potassium 3.5 - 5.1 mmol/L 4.7  4.7  4.3   Chloride 98 - 111 mmol/L 101  99  97   CO2 22 - 32 mmol/L 28  27  27    Calcium 8.9 - 10.3 mg/dL 10.1  10.3  10.9   Total Protein 6.5 - 8.1 g/dL 6.5  6.9    Total Bilirubin 0.3 - 1.2 mg/dL 0.4  0.3    Alkaline Phos 38 - 126 U/L 69     AST 15 - 41 U/L 29  22    ALT 0 - 44 U/L 23  20       03/18/2020 Mammogram  05/30/2021 Mammogram        RADIOGRAPHIC STUDIES: I have personally reviewed the radiological images as listed and agreed with the findings in the report.  ASSESSMENT & PLAN:   89 y.o. female with  1. H/o Easily bruising - labs did not demonstrate any specific bleeding diathesis 2. Stage IB ER/PR/Her 2 negative, grade 3 left breast cancer s/p mastectomy. NegSNLBx. 06/04/2019. Was not considered to be a good candidate for adjuvant chemotherapy given her age and medical issues. 3. LUE lymphedema-left breast mastectomy and lymph node biopsy.  Controlled with use of lymphedema sleeve.  Discussed maintaining compliance with use of her sleeve.  PLAN:  -Discussed lab results on 06/22/2022 with patient. CBC showed WBC pending, hemoglobin of 12.2, and platelets of 150K. -mammogram 04/04/2022 negative  -CMP normal -Patient has no clinical evidence suggestive of breast cancer  recurrence/progression at this time. -Patient shall continue to follow up with urogynocologist to continue to monitor urinary issues. Discussed risk for recurrent infections -  Patient would like to proceed with CT scan to further evaluate her left rib pain -Continue 2000 IU Vitamin D daily -Will see back in 6 months with labs.  2)  Patient Active Problem List   Diagnosis Date Noted   Sciatica associated with disorder of multiple sites of spine 03/20/2021   Bipolar II disorder ([place]) 03/20/2021   Chronic pain syndrome 03/20/2021   Dementia without behavioral disturbance ([place]) 02/04/2020   Urinary retention 02/04/2020   Seasonal allergies 02/04/2020   Recurrent major depressive disorder, in partial remission ([place]) 07/17/2019   Status post left mastectomy 07/01/2019   Breast cancer of lower-outer quadrant of left female breast ([place]) 06/04/2019   Candida infection, oral 01/15/2019   Senile purpura ([place]) 04/14/2018   Lumbar post-laminectomy syndrome 12/03/2017   Lumbar spondylosis 12/03/2017   History of back surgery 09/01/2017   Constipation 09/01/2017   Hypothyroidism due to acquired atrophy of thyroid 09/01/2017   Mixed hyperlipidemia 09/01/2017   Age-related osteoporosis without current pathological fracture 09/01/2017   High risk medication use 09/01/2017   Arthritis of hand 05/17/2017   Atherosclerosis of native arteries of extremity with intermittent claudication ([place]) 05/15/2017   Status post total bilateral knee replacement 12/20/2016   Gait abnormality 07/16/2016   Chronic low back pain 07/16/2016   Mild cognitive impairment 07/16/2016   Wrist pain 05/10/2015   S/P left TKA 11/29/2014   S/P knee replacement 11/29/2014   Spontaneous bruising 08/27/2014   Numbness and tingling in right hand 07/28/2014   Essential tremor 07/28/2014   Dizziness 05/28/2014   Shaky 05/28/2014   Memory loss 05/28/2014   Depression 05/06/2014   Lipoma of left upper thigh 3x5 cm 09/28/2011    Cerebral vascular accident ([place]) 08/09/2011   Syncope 08/09/2011   History of breast cancer [id number], s/p BCT 2008, triple negative 01/26/2011   Chronic L breast pain with chronic recurrent seroma, s/p excisional biopsy 01/12/2010 01/26/2011   Fainted    ICD (implantable cardioverter-defibrillator), biventricular, in situ 0000000   Chronic systolic heart failure ([place]) 08/02/2010   Essential hypertension 08/02/2010   -continue f/u with PCP for mx of other chronic medical issues.  FOLLOW-UP: CT chest without contrast in 1 week Phone visit with Dr. Irene Limbo in 2 to 3 weeks  The total time spent in the appointment was *** minutes* .  All of the patient's questions were answered with apparent satisfaction. The patient knows to call the clinic with any problems, questions or concerns.   Sullivan Lone MD MS AAHIVMS [facility] Hematology/Oncology Physician [facility]  .*Total Encounter Time as defined by the Centers for Medicare and Medicaid Services includes, in addition to the face-to-face time of a patient visit (documented in the note above) non-face-to-face time: obtaining and reviewing outside history, ordering and reviewing medications, tests or procedures, care coordination (communications with other health care professionals or caregivers) and documentation in the medical record.    I,Mitra Faeizi,acting as a Education administrator for Sullivan Lone, MD.,have documented all relevant documentation on the behalf of Sullivan Lone, MD,as directed by  Sullivan Lone, MD while in the presence of Sullivan Lone, MD.  ***

## 2022-06-25 ENCOUNTER — Encounter: Payer: Medicare Other | Admitting: Nurse Practitioner

## 2022-06-27 ENCOUNTER — Ambulatory Visit (INDEPENDENT_AMBULATORY_CARE_PROVIDER_SITE_OTHER): Payer: Medicare Other | Admitting: Obstetrics and Gynecology

## 2022-06-27 ENCOUNTER — Encounter: Payer: Self-pay | Admitting: Obstetrics and Gynecology

## 2022-06-27 VITALS — BP 113/69 | HR 72

## 2022-06-27 DIAGNOSIS — N3281 Overactive bladder: Secondary | ICD-10-CM | POA: Diagnosis not present

## 2022-06-27 MED ORDER — PUMPKIN SEED OIL PO CAPS: 1.0000 | ORAL_CAPSULE | Freq: Every day | ORAL | 0 refills | Status: AC

## 2022-06-27 NOTE — Patient Instructions (Signed)
You can try pumpkin seed extract capsules. They typically make them in 1000mg  capsules. I suggest starting with one of those in the morning and one at night but you can go up to 5000mg  daily.   The other medication we discussed trying is called Trospium. We can always revisit this option late if you would like.   The urethral bulking procedure can be helpful with leaking with changes in position, cough, and lifting things. We use bulkamid in the office.

## 2022-06-27 NOTE — Progress Notes (Signed)
[facility]   Subjective:     Chief Complaint:  Chief Complaint  Patient presents with   [name] is a 89 y.o. female is here for pessary check.   History of Present Illness: [name] is a 89 y.o. female with stage III pelvic organ prolapse and stress incontinence who presents for a pessary check. She is using a size #3 incontinence dish pessary. The pessary has not helped with her leakage and she would like her #3 ring put back instead.   Past Medical History: Patient  has a past medical history of Arthritis, Cancer ([place]), Cataracts, bilateral, CHF (congestive heart failure) ([place]), Complication of anesthesia, Depression, Dyslipidemia, Fainted (04/21/06), GERD (gastroesophageal reflux disease), Headache(784.0), Hearing loss, HLD (hyperlipidemia), Hypertension, Hypothyroidism, ICD (implantable cardiac defibrillator) in place, ICD (implantable cardiac defibrillator), biventricular, in situ, LBBB (left bundle branch block), Memory loss, Nonischemic cardiomyopathy ([place]), Normal coronary arteries, Pacemaker, Syncope, Systolic CHF ([place]), Vertigo, and Wears glasses.   Past Surgical History: She  has a past surgical history that includes Total knee arthroplasty (05/17/01); Lumbar fusion (2006); Mastectomy, partial (2008); Breast surgery (2000); Joint replacement (06/14/01); Pacemaker insertion (04/23/06); Back surgery; Cardiac catheterization; Mass excision (11/08/2011); implantable cardioverter defibrillator generator change (N/A, 12/18/2012); Cataract extraction; Eye surgery; Total knee arthroplasty (Left, 11/29/2014); Breast lumpectomy (Left, 2008); Colonoscopy; and Mastectomy w/ sentinel node biopsy (Left, 06/04/2019).   Medications: She has a current medication list which includes the following prescription(s): acetaminophen, acetaminophen, albuterol, ascorbic acid, bupropion, carvedilol, vitamin d3, diclofenac sodium, duloxetine, estradiol, famotidine,  fluticasone, furosemide, levothyroxine, lidocaine, lubiprostone, magnesium, mirabegron er, pumpkin seed oil, NONFORMULARY OR COMPOUNDED ITEM, omega-3 fatty acids, omeprazole, polyethylene glycol powder, pregabalin, senna-docusate, solifenacin, spironolactone, telmisartan, and vitamin e.   Allergies: Patient is allergic to iodine, shellfish allergy, memantine, aspirin, and codeine.   Social History: Patient  reports that she has never smoked. She has never been exposed to tobacco smoke. She has never used smokeless tobacco. She reports that she does not drink alcohol and does not use drugs.      Objective:    Physical Exam: BP 113/69   Pulse 72   LMP  (LMP Unknown)  Gen: No apparent distress, A&O x 3. Detailed Urogynecologic Evaluation:  Pelvic Exam: Normal external female genitalia; Bartholin's and Skene's glands normal in appearance; urethral meatus normal in appearance, no urethral masses or discharge. The pessary was noted to be in place. It was removed and cleaned. Speculum exam revealed no lesions in the vagina. The pessary was replaced with the #3 ring she previously had. It was comfortable to the patient and fit well.    Assessment/Plan:    Assessment: Ms. Herlong is a 89 y.o. with stage III pelvic organ prolapse and stress incontinence here for a pessary check. She is doing well.  Plan: She will keep the pessary in place until next visit. She will continue to use estrogen. She will follow-up in 3 months for a pessary check or sooner as needed.   We discussed adding in pumpkin seed extract as she was not interested In the trospium today. Discussed that adding in up to 5gm a day has been shown to assist in overactive bladder symptoms. As for her leakage, she seems to be leaking predominantly with walking, standing, and changing positions. We discussed that if medications have not been helpful then we have some assistance potentially with bladder botox for OAB or Urethral bulking for  SUI. Information given on urethral bulking as she  is not interested in bladder botox at all.    All questions were answered.

## 2022-07-02 ENCOUNTER — Telehealth: Payer: Self-pay

## 2022-07-02 ENCOUNTER — Encounter: Payer: Self-pay | Admitting: Obstetrics and Gynecology

## 2022-07-02 ENCOUNTER — Telehealth: Payer: Self-pay | Admitting: Obstetrics and Gynecology

## 2022-07-02 NOTE — Telephone Encounter (Signed)
Message sent to patient about the surgical procedure discussed at patient and family request.

## 2022-07-02 NOTE — Telephone Encounter (Signed)
Patient called and requested to have a discussion of surgical intervention/urethral bulking. We talked about doing bladder testing prior to deciding on intervention so we have a good idea of the source of leaking. Patient reports she is constantly wet and leaking which is distressing for her.   She does not wish to try Trospium.   Will have patient come in for Urodynamics testing and possible pessary change as well.

## 2022-07-09 ENCOUNTER — Encounter: Payer: Self-pay | Admitting: Nurse Practitioner

## 2022-07-09 ENCOUNTER — Ambulatory Visit (INDEPENDENT_AMBULATORY_CARE_PROVIDER_SITE_OTHER): Payer: Medicare Other | Admitting: Nurse Practitioner

## 2022-07-09 VITALS — BP 116/70 | HR 72 | Temp 97.1°F | Ht <= 58 in | Wt 112.0 lb

## 2022-07-09 DIAGNOSIS — I700 Atherosclerosis of aorta: Secondary | ICD-10-CM

## 2022-07-09 DIAGNOSIS — J849 Interstitial pulmonary disease, unspecified: Secondary | ICD-10-CM | POA: Diagnosis not present

## 2022-07-09 DIAGNOSIS — D869 Sarcoidosis, unspecified: Secondary | ICD-10-CM

## 2022-07-09 DIAGNOSIS — I70223 Atherosclerosis of native arteries of extremities with rest pain, bilateral legs: Secondary | ICD-10-CM

## 2022-07-09 DIAGNOSIS — M810 Age-related osteoporosis without current pathological fracture: Secondary | ICD-10-CM

## 2022-07-09 DIAGNOSIS — L853 Xerosis cutis: Secondary | ICD-10-CM

## 2022-07-09 DIAGNOSIS — E039 Hypothyroidism, unspecified: Secondary | ICD-10-CM

## 2022-07-09 DIAGNOSIS — F0390 Unspecified dementia without behavioral disturbance: Secondary | ICD-10-CM

## 2022-07-09 DIAGNOSIS — I5022 Chronic systolic (congestive) heart failure: Secondary | ICD-10-CM | POA: Diagnosis not present

## 2022-07-09 DIAGNOSIS — D692 Other nonthrombocytopenic purpura: Secondary | ICD-10-CM

## 2022-07-09 DIAGNOSIS — F3181 Bipolar II disorder: Secondary | ICD-10-CM

## 2022-07-09 MED ORDER — ALBUTEROL SULFATE HFA 108 (90 BASE) MCG/ACT IN AERS
2.0000 | INHALATION_SPRAY | Freq: Four times a day (QID) | RESPIRATORY_TRACT | 11 refills | Status: DC | PRN
Start: 2022-07-09 — End: 2024-02-12

## 2022-07-09 NOTE — Patient Instructions (Addendum)
To call and make appt with pulmonary  Freda Jackson, MD Pulmonologist in [place], [facility] Address: [address], [place], [place] [address] Phone: [phone]  No alcohol to your skin- this is too harsh can cause more irritation and breakdown.  - Use a mild, unscented soap.  You can use what you like (Dove, Aveeno, etc). - Bathe every other day if possible.  - After the bath apply a good lotion (unscented).   -recommendations include- Aveeno eczema, Aquafor, Eucerin. -make sure to stay hydrated.

## 2022-07-09 NOTE — Progress Notes (Unsigned)
Careteam: Patient Care Team: [name], NP as PCP - General (Geriatric Medicine) [name], MD as PCP - Electrophysiology (Cardiology) [name], MD as Consulting Physician (General Surgery) Paralee Cancel, MD as Consulting Physician (Orthopedic Surgery) Marica Otter, [place] (Optometry) Marcial Pacas, MD as Consulting Physician (Neurology) [name], MD as Consulting Physician (Cardiology) Ricard Dillon, MD (Psychiatry)  PLACE OF SERVICE:  [place] Directive information Does Patient Have a Medical Advance Directive?: Yes, Type of Advance Directive: [place];Living will;Out of facility DNR (pink MOST or yellow form), Pre-existing out of facility DNR order (yellow form or pink MOST form): Yellow form placed in chart (order not valid for inpatient use), Does patient want to make changes to medical advance directive?: No - Patient declined  Allergies  Allergen Reactions   Iodine Shortness Of Breath    Iodine contrast, CHF , SOB   Shellfish Allergy Shortness Of Breath   Memantine     Malaise, fogginess/ couldn't think   Aspirin Nausea And Vomiting   Codeine Nausea And Vomiting    Chief Complaint  Patient presents with   Medical Management of Chronic Issues    Routine follow-up.      HPI: Patient is a 89 y.o. female here for routine follow up.   She is following with urology and having treatment for her overactive bladder. She has a pessary in now but does not seem to be helping with leaking. They are working with her on other options.   She is following with Dr. Irene Limbo due to bruising easily and hx of breast cancer. She is due for a CT chest this week.   She was seen for some shortness of breath at the end of February and took furosemide daily for about a week. She has had to use the albuterol inhaler about 3 times since February, whereas she was not using it at all prior to that. She last used it a few days ago. Having  some SOB episodes intermittently. Denies swelling in extremities. Her daughter reports that they are very careful about the amount of salt she has due to her high blood pressure. Denies wheezing. Denies chest pain. She is able to lay flat on 1 pillow to sleep at night.   She saw pulmonology in October 2022 for abnormal findings on lung imaging. She was diagnosed with sarcoidosis and was given albuterol and to return for f/u but has not been back since then. She was not having SOB or needing the albuterol since then until recently.   Her back hurts chronically, and she has some exercises from PT that assist her with the pain. Her mood is stable, has been a little bit low when her back is hurting. She does not want to adjust any medications at this time and hopes that as the weather is getting better she will improve.  Some dry skin dermatitis on her R wrist. The patient reports she only showers once a week but she wipes with alcohol daily.   Review of Systems:  Review of Systems  Constitutional:  Negative for chills, fever, malaise/fatigue and weight loss.  HENT:  Negative for congestion and sore throat.   Eyes:  Negative for blurred vision.  Respiratory:  Positive for shortness of breath. Negative for cough and wheezing.   Cardiovascular:  Negative for chest pain, palpitations and leg swelling.  Gastrointestinal:  Negative for abdominal pain, blood in stool, constipation, diarrhea, heartburn, nausea and vomiting.  Genitourinary:  Negative for dysuria, frequency, hematuria and urgency.  Musculoskeletal:  Negative for falls and joint pain.  Skin:  Negative for rash.  Neurological:  Negative for dizziness, tingling and headaches.  Endo/Heme/Allergies:  Negative for polydipsia.  Psychiatric/Behavioral:  Negative for depression. The patient is not nervous/anxious.     Past Medical History:  Diagnosis Date   Arthritis    Cancer    left breast cancer    Cataracts, bilateral    removed by  surgery   CHF (congestive heart failure)    PACEMAKER & DEFIB   Complication of anesthesia    hypotensive after back surgery in 2006   Depression    Dyslipidemia    Fainted 04/21/06   AT [facility]   GERD (gastroesophageal reflux disease)    Headache(784.0)    Hearing loss    bilateral hearing aids   HLD (hyperlipidemia)    diet controlled    Hypertension    Hypothyroidism    ICD (implantable cardiac defibrillator) in place    pt has pacer/icd   ICD (implantable cardiac defibrillator), biventricular, in situ    LBBB (left bundle branch block)    Memory loss    Nonischemic cardiomyopathy    Normal coronary arteries    s/p cardiac cath 2007   Pacemaker    ICD [employer]   Syncope    Systolic CHF    Vertigo    Wears glasses    Past Surgical History:  Procedure Laterality Date   BACK SURGERY     lumbar fusion    BREAST LUMPECTOMY Left 2008   BREAST SURGERY  2000   LUMP REMOVAL. STAGE 1 CANCER   CARDIAC CATHETERIZATION     CATARACT EXTRACTION     COLONOSCOPY     EYE SURGERY     IMPLANTABLE CARDIOVERTER DEFIBRILLATOR GENERATOR CHANGE N/A 12/18/2012   Procedure: IMPLANTABLE CARDIOVERTER DEFIBRILLATOR GENERATOR CHANGE;  Surgeon: [name], MD;  Location: [facility] CATH LAB;  Service: Cardiovascular;  Laterality: N/A;   JOINT REPLACEMENT  06/14/01   right   LUMBAR FUSION  2006   MASS EXCISION  11/08/2011   Procedure: EXCISION MASS;  Surgeon: [name], MD;  Location: [facility];  Service: General;  Laterality: Left;  Excision Left Thigh Mass   MASTECTOMY W/ SENTINEL NODE BIOPSY Left 06/04/2019   Procedure: LEFT MASTECTOMY WITH SENTINEL LYMPH NODE BIOPSY;  Surgeon: [name], MD;  Location: [place];  Service: General;  Laterality: Left;   MASTECTOMY, PARTIAL  2008   GOT PACEMAKER AND DEFIB AT THAT TIME   PACEMAKER INSERTION  04/23/06   TOTAL KNEE ARTHROPLASTY  05/17/01   RIGHT KNEE   TOTAL KNEE ARTHROPLASTY Left 11/29/2014   Procedure: TOTAL LEFT KNEE ARTHROPLASTY;  Surgeon:  Paralee Cancel, MD;  Location: [facility];  Service: Orthopedics;  Laterality: Left;   Social History:   reports that she has never smoked. She has never been exposed to tobacco smoke. She has never used smokeless tobacco. She reports that she does not drink alcohol and does not use drugs.  Family History  Problem Relation Age of Onset   Hypertension Mother    Arthritis Mother    Hypertension Father    Hypertension Brother    Hypertension Brother     Medications: Patient's Medications  New Prescriptions   No medications on file  Previous Medications   ACETAMINOPHEN (TYLENOL) 500 MG TABLET    Take 500 mg by mouth 2 (two) times daily.  in the morning and  afternoon   ACETAMINOPHEN (TYLENOL) 650 MG CR TABLET    Take 650 mg by mouth at bedtime.   ASCORBIC ACID (VITAMIN C) 500 MG TABLET    Take 500 mg by mouth daily.   BUPROPION (WELLBUTRIN XL) 150 MG 24 HR TABLET    Take 1 tablet (150 mg total) by mouth daily.   CARVEDILOL (COREG) 3.125 MG TABLET    TAKE 1 TABLET BY MOUTH TWICE  DAILY   CHOLECALCIFEROL (VITAMIN D3) 50 MCG (2000 UT) TABS    Take 2,000 Units by mouth daily.    DICLOFENAC SODIUM (VOLTAREN) 1 % GEL    Apply 4 g topically 4 (four) times daily.   DULOXETINE (CYMBALTA) 60 MG CAPSULE    Take 60 mg by mouth daily.   ESTRADIOL (ESTRACE) 0.1 MG/GM VAGINAL CREAM    Place 0.5 g vaginally 2 (two) times a week. Place 0.5g nightly for two weeks then twice a week after   FAMOTIDINE (PEPCID) 10 MG TABLET    Take 10 mg by mouth as needed for heartburn or indigestion.   FLUTICASONE (FLONASE) 50 MCG/ACT NASAL SPRAY    Place 2 sprays into both nostrils daily.   FUROSEMIDE (LASIX) 20 MG TABLET    Take 1 tablet (20 mg total) by mouth daily as needed.   LEVOTHYROXINE (SYNTHROID, LEVOTHROID) 100 MCG TABLET    Take 1 tablet (100 mcg total) by mouth daily before breakfast.   LIDOCAINE 4 % PTCH    Apply 1 patch topically every 12 (twelve) hours. Apply to back   LUBIPROSTONE (AMITIZA) 8 MCG CAPSULE    TAKE  ONE CAPSULE BY MOUTH TWICE DAILY WITH A MEAL   MAGNESIUM 250 MG TABS    Take 250 mg by mouth daily.   MIRABEGRON ER (MYRBETRIQ) 50 MG TB24 TABLET    Take 1 tablet (50 mg total) by mouth daily.   MISC NATURAL PRODUCTS (PUMPKIN SEED OIL) CAPS    Take 1 capsule by mouth daily.   NALTREXONE HCL, PAIN, PO    Take 4 mg by mouth at bedtime.   NONFORMULARY OR COMPOUNDED ITEM    Apply 120 Tubes topically daily. Diclofenac/Cyclobenzaprine/lamotrigine/lidocaine/prilocaine (10480) 2%/2%/6%/5%/1.25% cream QTY: 120 GM SIG: (NEURO) APPLY 1-2 PUMPS (1-2 GMS) TO AFFECTED AREA (S) OR FOCAL POINTS 3 TO 4 TIMES DAILY. RUB IN FOR 2 MINUTES TO ACHIEVE MAX PENETRATION. [place] HANDS WELL.   OMEGA-3 FATTY ACIDS (FISH OIL PO)    Take 1 capsule by mouth daily.   OMEPRAZOLE (PRILOSEC) 40 MG CAPSULE    TAKE 1 CAPSULE BY MOUTH DAILY   POLYETHYLENE GLYCOL POWDER (GLYCOLAX/MIRALAX) 17 GM/SCOOP POWDER    Take 1 Container by mouth daily.   PREGABALIN (LYRICA) 25 MG CAPSULE    Take 2 capsules (50 mg total) by mouth at bedtime. For nerve pain/back pain   SENNA-DOCUSATE (SENOKOT S) 8.6-50 MG TABLET    Take 2 tablets by mouth daily.   SOLIFENACIN (VESICARE) 5 MG TABLET    Take 1 tablet by mouth daily.   SPIRONOLACTONE (ALDACTONE) 25 MG TABLET    TAKE 1 TABLET BY MOUTH IN THE  MORNING   TELMISARTAN (MICARDIS) 20 MG TABLET    Take 1 tablet (20 mg total) by mouth daily.   VITAMIN E 400 UNITS TABS    Take 400 Units by mouth daily.   Modified Medications   Modified Medication Previous Medication   ALBUTEROL (VENTOLIN HFA) 108 (90 BASE) MCG/ACT INHALER albuterol (VENTOLIN HFA) 108 (90 Base) MCG/ACT inhaler  Inhale 2 puffs into the lungs every 6 (six) hours as needed for wheezing or shortness of breath.    Inhale 2 puffs into the lungs every 6 (six) hours as needed for wheezing or shortness of breath.  Discontinued Medications   NALTREXONE HCL, PAIN, 4.5 MG CAPS    Take 1 capsule by mouth at bedtime.    Physical Exam:  Vitals:    07/09/22 0942  BP: 116/70  Pulse: 72  Temp: (!) 97.1 F (36.2 C)  TempSrc: Temporal  SpO2: 96%  Weight: 112 lb (50.8 kg)  Height: 4\' 10"  (1.473 m)   Body mass index is 23.41 kg/m. Wt Readings from Last 3 Encounters:  07/09/22 112 lb (50.8 kg)  06/22/22 111 lb 2 oz (50.4 kg)  06/06/22 112 lb (50.8 kg)    Physical Exam Vitals reviewed.  Constitutional:      General: She is not in acute distress.    Appearance: Normal appearance.  Cardiovascular:     Rate and Rhythm: Normal rate and regular rhythm.  Pulmonary:     Effort: No respiratory distress.     Breath sounds: Normal breath sounds.  Abdominal:     General: Bowel sounds are normal. There is no distension.     Palpations: Abdomen is soft. There is no mass.     Tenderness: There is no abdominal tenderness. There is no guarding.  Musculoskeletal:     Cervical back: Neck supple.     Right lower leg: Edema present.     Left lower leg: Edema present.     Comments: nonpitting  Lymphadenopathy:     Cervical: No cervical adenopathy.  Skin:    General: Skin is warm and dry.     Comments: R wrist raised redness approx dime size- pt has been wiping with alcohol  Neurological:     Mental Status: She is alert and oriented to person, place, and time.  Psychiatric:        Mood and Affect: Mood normal.     Labs reviewed: Basic Metabolic Panel: Recent Labs    12/04/21 1016 12/22/21 0912 06/22/22 0918  NA 134* 135 133*  K 4.7 4.7 4.6  CL 99 101 102  CO2 27 28 26   GLUCOSE 57* 73 123*  BUN 19 19 18   CREATININE 0.77 0.73 0.76  CALCIUM 10.3 10.1 10.1   Liver Function Tests: Recent Labs    12/04/21 1016 12/22/21 0912 06/22/22 0918  AST 22 29 26   ALT 20 23 16   ALKPHOS  --  69 89  BILITOT 0.3 0.4 0.4  PROT 6.9 6.5 7.8  ALBUMIN  --  3.6 3.6   Recent Labs    12/04/21 1016  LIPASE 14  AMYLASE 34   No results for input(s): "AMMONIA" in the last 8760 hours. CBC: Recent Labs    12/22/21 0912 01/17/22 1116  06/22/22 0918  WBC 5.9 7.1 6.7  NEUTROABS 3.7 5,098 4.2  HGB 11.8* 12.3 12.2  HCT 35.2* 38.0 36.9  MCV 88.7 90.7 86.8  PLT 151 144 150   Lipid Panel: No results for input(s): "CHOL", "HDL", "LDLCALC", "TRIG", "CHOLHDL", "LDLDIRECT" in the last 8760 hours. TSH: No results for input(s): "TSH" in the last 8760 hours. A1C: No results found for: "HGBA1C"   Assessment/Plan 1. Atherosclerosis of native artery of both lower extremities with rest pain Stable at this time, without complaints of pain at this time.  2. Interstitial pulmonary disease Pt to f/u with pulmonology. F/u CT chest. Continue albuterol  PRN.  3. Chronic systolic heart failure Euvolemic, Continue spironolactone, telmisartan, and carvedilol, furosemide as needed. F/u with cardiology.  4. Bipolar II disorder Stable. Continue cymbalta and lyrica  5. Senile purpura Blood counts stable at this time, following with hematology  6. Dementia without behavioral disturbance Stable at this time. Has the support of her daughter. No recent changes in cognitive or functional status.  7. Sarcoidosis Worsening shortness of breath at this time. Pt to f/u with pulmonology. Continue albuterol. F/u CT chest scheduled later this week. - albuterol (VENTOLIN HFA) 108 (90 Base) MCG/ACT inhaler; Inhale 2 puffs into the lungs every 6 (six) hours as needed for wheezing or shortness of breath.  Dispense: 8 g; Refill: 11  8. Age-related osteoporosis without current pathological fracture Continues on prolia every 6 months.  -Recommended to take calcium 600 mg twice daily with Vitamin D 2000 units daily and weight bearing activity 30 mins/5 days a week  9. Aortic atherosclerosis Noted on imaging. Pt unable to take aspirin. - Lipid Panel  10. Acquired hypothyroidism Stable. Continue levothyroxine. - TSH  11. Dry skin dermatitis Advised patient to discontinue use of alcohol on skin and irritated areas. - Use a mild, unscented soap.  You  can use what you like (Dove, Aveeno, etc). - Bathe every other day if possible.  - After the bath apply a good lotion (unscented).   -recommendations include- Aveeno eczema, Aquafor, Eucerin. -make sure to stay hydrated.   Return in about 4 months (around 11/08/2022) for routine follow up.  Student- Archer Asa O'Berry ACPCNP-S  I personally was present during the history, physical exam and medical decision-making activities of this service and have verified that the service and findings are accurately documented in the student's note Jlynn Langille K. [place], [facility] [phone]

## 2022-07-10 LAB — LIPID PANEL
Cholesterol: 146 mg/dL (ref <200)
HDL: 57 mg/dL (ref >=50)
LDL Cholesterol (Calc): 75 mg/dL (calc)
Non-HDL Cholesterol (Calc): 89 mg/dL (calc) (ref <130)
Total CHOL/HDL Ratio: 2.6 (calc) (ref <5.0)
Triglycerides: 67 mg/dL (ref <150)

## 2022-07-10 LAB — TSH: TSH: 0.14 mIU/L — ABNORMAL LOW (ref 0.40–4.50)

## 2022-07-11 ENCOUNTER — Other Ambulatory Visit: Payer: Self-pay | Admitting: Nurse Practitioner

## 2022-07-11 DIAGNOSIS — E039 Hypothyroidism, unspecified: Secondary | ICD-10-CM

## 2022-07-11 MED ORDER — LEVOTHYROXINE SODIUM 88 MCG PO TABS
88.0000 ug | ORAL_TABLET | Freq: Every day | ORAL | 3 refills | Status: DC
Start: 2022-07-11 — End: 2023-06-26

## 2022-07-13 ENCOUNTER — Ambulatory Visit (HOSPITAL_COMMUNITY)
Admission: RE | Admit: 2022-07-13 | Discharge: 2022-07-13 | Disposition: A | Discharge status: Home / Self Care | Payer: Medicare Other | Source: Ambulatory Visit | Attending: Hematology | Admitting: Hematology

## 2022-07-13 DIAGNOSIS — Z171 Estrogen receptor negative status [ER-]: Secondary | ICD-10-CM | POA: Insufficient documentation

## 2022-07-13 DIAGNOSIS — R0781 Pleurodynia: Secondary | ICD-10-CM | POA: Diagnosis present

## 2022-07-13 DIAGNOSIS — C50912 Malignant neoplasm of unspecified site of left female breast: Secondary | ICD-10-CM | POA: Diagnosis present

## 2022-07-16 ENCOUNTER — Inpatient Hospital Stay: Payer: Medicare Other | Attending: Hematology | Admitting: Hematology

## 2022-07-16 DIAGNOSIS — C50912 Malignant neoplasm of unspecified site of left female breast: Secondary | ICD-10-CM | POA: Diagnosis present

## 2022-07-16 DIAGNOSIS — R0781 Pleurodynia: Secondary | ICD-10-CM | POA: Diagnosis not present

## 2022-07-16 DIAGNOSIS — Z171 Estrogen receptor negative status [ER-]: Secondary | ICD-10-CM | POA: Diagnosis not present

## 2022-07-16 NOTE — Progress Notes (Signed)
HEMATOLOGY/ONCOLOGY CLINIC NOTE  Date of Service: 07/16/22   Patient Care Team: [name], NP as PCP - General (Geriatric Medicine) Marinus Maw, MD as PCP - Electrophysiology (Cardiology) Almond Lint, MD as Consulting Physician (General Surgery) Durene Romans, MD as Consulting Physician (Orthopedic Surgery) Blima Ledger, OD (Optometry) Levert Feinstein, MD as Consulting Physician (Neurology) Marinus Maw, MD as Consulting Physician (Cardiology) Marcellina Millin, MD (Psychiatry)  CHIEF COMPLAINTS/PURPOSE OF CONSULTATION:   Continued evaluation and management of her stage 1B high-grade triple negative breast cancer  HISTORY OF PRESENTING ILLNESS:   [name] is a wonderful 89 y.o. female who has been referred to [place] by Abbey Chatters, NP for evaluation and management of easily bruising. The pt reports that she is doing well overall.   The pt reports that she has seen bruises on her arm, and denies bumping into things or any trauma. The pt notes that her bruises are solely located to her upper extremities. She denies concerns for bleeding in her joints, blood in the stools, blood in the urine, nose bleeds or gum bleeds. She notes she has bruised easily for about 2 years. She also notes that she began taking Zoloft about two years ago, and fish oil 4-5 years ago. She is not on any blood thinners. She denies taking any other new medications in the last couple years. The pt denies any thick bruises at any time. The pt denies excessive bleeding with pervious surgeries and dental extractions. The pt denies heavy periods when she was younger.  She has had two knee replacements and took Tramadol for her pain. She denies needing to use this frequently, and takes Tylenol for mild pains.    The pt takes Vitamin E oil for hot flashes, and notes that her Vitamin E oil successfully resolved her hot flashes. She began taking Vitamin E about 18 months ago.   The pt notes  that she has lost about 20 pounds over two years. The pt reports some constipation and denies difficulty swallowing, and weak appetite. The pt denies any dietary restrictions. She continues annual mammograms. She has a history of left sided breast cancer treated with a lumpectomy and radiation.  She notes that her energy levels have also decreased in the last 6 months, and "feels tired all the time." She endorses feeling well rested after taking naps. She notes that she does feel depressed but that taking Zoloft keeps her "afloat." She notes that she feels "medium" enjoyment in her activities. She sees psychiatry every 6 months.  The pt lives with her husband.   Most recent lab results (02/05/18) of CBC w/diff and CMP is as follows: all values are WNL except for PLT at 117k, BUN at 30, Creatinine at 0.96.  On review of systems, pt reports some stable depression, easily bruising on upper extremities, some weight loss, and denies nose bleeds, gum bleeds, blood in the urine, blood in the stools, abdominal pains, leg swelling, and any other symptoms.   On Family Hx the pt denies bleeding disorders.   INTERVAL HISTORY:   [name] is a wonderful 89 y.o.female   here for continued evaluation and management of her stage I 1B high-grade triple negative breast cancer.  .I connected with Jorja Loa on -07/16/2022 at  8:40 AM EDT by telephone visit and verified that I am speaking with the correct person using two identifiers.   Patient was last seen by me on 06/22/2022 and she complained  of rip pain inder her left UE, gradual loss of appetite, and urinary frequency.   During this phone visit, she complains of consistent rip pain which has not improved. Patient's daughter notes that she has mild breathing problems and her last visit with Pulmonologist in 2022.   She denies fever, chills, night sweats, back pain, abdominal pain, or leg swelling.   We discussed her recent CT scan and lab  results.   I discussed the limitations, risks, security and privacy concerns of performing an evaluation and management service by telemedicine and the availability of in-person appointments. I also discussed with the patient that there may be a patient responsible charge related to this service. The patient expressed understanding and agreed to proceed.   Other persons participating in the visit and their role in the encounter: Daughter   Patient's location: Home  Provider's location: [facility]   Chief Complaint: Continued evaluation and management of her stage I 1B high-grade triple negative breast cancer     MEDICAL HISTORY:  Past Medical History:  Diagnosis Date   Arthritis    Cancer    left breast cancer    Cataracts, bilateral    removed by surgery   CHF (congestive heart failure)    PACEMAKER & DEFIB   Complication of anesthesia    hypotensive after back surgery in 2006   Depression    Dyslipidemia    Fainted 04/21/06   AT [facility]   GERD (gastroesophageal reflux disease)    Headache(784.0)    Hearing loss    bilateral hearing aids   HLD (hyperlipidemia)    diet controlled    Hypertension    Hypothyroidism    ICD (implantable cardiac defibrillator) in place    pt has pacer/icd   ICD (implantable cardiac defibrillator), biventricular, in situ    LBBB (left bundle branch block)    Memory loss    Nonischemic cardiomyopathy    Normal coronary arteries    s/p cardiac cath 2007   Pacemaker    ICD [employer]   Syncope    Systolic CHF    Vertigo    Wears glasses     SURGICAL HISTORY: Past Surgical History:  Procedure Laterality Date   BACK SURGERY     lumbar fusion    BREAST LUMPECTOMY Left 2008   BREAST SURGERY  2000   LUMP REMOVAL. STAGE 1 CANCER   CARDIAC CATHETERIZATION     CATARACT EXTRACTION     COLONOSCOPY     EYE SURGERY     IMPLANTABLE CARDIOVERTER DEFIBRILLATOR GENERATOR CHANGE N/A 12/18/2012   Procedure: IMPLANTABLE CARDIOVERTER DEFIBRILLATOR  GENERATOR CHANGE;  Surgeon: Marinus Maw, MD;  Location: [facility] CATH LAB;  Service: Cardiovascular;  Laterality: N/A;   JOINT REPLACEMENT  06/14/01   right   LUMBAR FUSION  2006   MASS EXCISION  11/08/2011   Procedure: EXCISION MASS;  Surgeon: Almond Lint, MD;  Location: [facility] ORS;  Service: General;  Laterality: Left;  Excision Left Thigh Mass   MASTECTOMY W/ SENTINEL NODE BIOPSY Left 06/04/2019   Procedure: LEFT MASTECTOMY WITH SENTINEL LYMPH NODE BIOPSY;  Surgeon: Almond Lint, MD;  Location: [facility] OR;  Service: General;  Laterality: Left;   MASTECTOMY, PARTIAL  2008   GOT PACEMAKER AND DEFIB AT THAT TIME   PACEMAKER INSERTION  04/23/06   TOTAL KNEE ARTHROPLASTY  05/17/01   RIGHT KNEE   TOTAL KNEE ARTHROPLASTY Left 11/29/2014   Procedure: TOTAL LEFT KNEE ARTHROPLASTY;  Surgeon: Durene Romans, MD;  Location:  [facility] ORS;  Service: Orthopedics;  Laterality: Left;    SOCIAL HISTORY: Social History   Socioeconomic History   Marital status: Married    Spouse name: Not on file   Number of children: 1   Years of education: Masters   Highest education level: Master's degree (e.g., MA, MS, MEng, MEd, MSW, MBA)  Occupational History   Occupation: Retired  Tobacco Use   Smoking status: Never    Passive exposure: Never   Smokeless tobacco: Never  Vaping Use   Vaping Use: Never used  Substance and Sexual Activity   Alcohol use: No   Drug use: No   Sexual activity: Not Currently    Birth control/protection: Post-menopausal  Other Topics Concern   Not on file  Social History Narrative   Lives at home with husband.   Right-handed.      As of 07/28/2014   Diet: No special diet   Caffeine: yes, Chocolate, tea and sodas    Married: YES, 1970   House: Yes, 2 stories, 2-3 persons live in home   Pets: No   Current/Past profession: Nurse, mental health, Publishing rights manager    Exercise: Yes 2-3 x weekly   Living Will: Yes   DNR: No   POA/HPOA: No      Social Determinants of Health   Financial Resource  Strain: Low Risk  (07/05/2022)   Overall Financial Resource Strain (CARDIA)    Difficulty of Paying Living Expenses: Not very hard  Food Insecurity: No Food Insecurity (07/05/2022)   Hunger Vital Sign    Worried About Running Out of Food in the Last Year: Never true    Ran Out of Food in the Last Year: Never true  Transportation Needs: No Transportation Needs (07/05/2022)   PRAPARE - Administrator, Civil Service (Medical): No    Lack of Transportation (Non-Medical): No  Physical Activity: Unknown (07/05/2022)   Exercise Vital Sign    Days of Exercise per Week: Patient declined    Minutes of Exercise per Session: Not on file  Stress: Patient Declined (07/05/2022)   [employer] of Occupational Health - Occupational Stress Questionnaire    Feeling of Stress : Patient declined  Social Connections: Unknown (07/05/2022)   Social Connection and Isolation Panel [NHANES]    Frequency of Communication with Friends and Family: Twice a week    Frequency of Social Gatherings with Friends and Family: Patient declined    Attends Religious Services: More than 4 times per year    Active Member of [employer] or Organizations: Yes    Attends Banker Meetings: Not on file    Marital Status: Widowed  Intimate Partner Violence: Not At Risk (03/27/2017)   Humiliation, Afraid, Rape, and Kick questionnaire    Fear of Current or Ex-Partner: No    Emotionally Abused: No    Physically Abused: No    Sexually Abused: No    FAMILY HISTORY: Family History  Problem Relation Age of Onset   Hypertension Mother    Arthritis Mother    Hypertension Father    Hypertension Brother    Hypertension Brother     ALLERGIES:  is allergic to iodine, shellfish allergy, memantine, aspirin, and codeine.  MEDICATIONS:  Current Outpatient Medications  Medication Sig Dispense Refill   acetaminophen (TYLENOL) 500 MG tablet Take 500 mg by mouth 2 (two) times daily.  in the morning and afternoon      acetaminophen (TYLENOL) 650 MG CR tablet Take 650 mg by mouth at  bedtime.     albuterol (VENTOLIN HFA) 108 (90 Base) MCG/ACT inhaler Inhale 2 puffs into the lungs every 6 (six) hours as needed for wheezing or shortness of breath. 8 g 11   ascorbic acid (VITAMIN C) 500 MG tablet Take 500 mg by mouth daily.     buPROPion (WELLBUTRIN XL) 150 MG 24 hr tablet Take 1 tablet (150 mg total) by mouth daily.     carvedilol (COREG) 3.125 MG tablet TAKE 1 TABLET BY MOUTH TWICE  DAILY 180 tablet 3   Cholecalciferol (VITAMIN D3) 50 MCG (2000 UT) TABS Take 2,000 Units by mouth daily.      diclofenac Sodium (VOLTAREN) 1 % GEL Apply 4 g topically 4 (four) times daily. 100 g 2   DULoxetine (CYMBALTA) 60 MG capsule Take 60 mg by mouth daily.     estradiol (ESTRACE) 0.1 MG/GM vaginal cream Place 0.5 g vaginally 2 (two) times a week. Place 0.5g nightly for two weeks then twice a week after 30 g 11   famotidine (PEPCID) 10 MG tablet Take 10 mg by mouth as needed for heartburn or indigestion.     fluticasone (FLONASE) 50 MCG/ACT nasal spray Place 2 sprays into both nostrils daily. 16 g 3   furosemide (LASIX) 20 MG tablet Take 1 tablet (20 mg total) by mouth daily as needed. 90 tablet 3   levothyroxine (SYNTHROID) 88 MCG tablet Take 1 tablet (88 mcg total) by mouth daily. 90 tablet 3   Lidocaine 4 % PTCH Apply 1 patch topically every 12 (twelve) hours. Apply to back     lubiprostone (AMITIZA) 8 MCG capsule TAKE ONE CAPSULE BY MOUTH TWICE DAILY WITH A MEAL 180 capsule 1   Magnesium 250 MG TABS Take 250 mg by mouth daily.     mirabegron ER (MYRBETRIQ) 50 MG TB24 tablet Take 1 tablet (50 mg total) by mouth daily. 30 tablet 11   Misc Natural Products (PUMPKIN SEED OIL) CAPS Take 1 capsule by mouth daily. 180 capsule 0   NALTREXONE HCL, PAIN, PO Take 4 mg by mouth at bedtime.     NONFORMULARY OR COMPOUNDED ITEM Apply 120 Tubes topically daily. Diclofenac/Cyclobenzaprine/lamotrigine/lidocaine/prilocaine (16109)  2%/2%/6%/5%/1.25% cream QTY: 120 GM SIG: (NEURO) APPLY 1-2 PUMPS (1-2 GMS) TO AFFECTED AREA (S) OR FOCAL POINTS 3 TO 4 TIMES DAILY. RUB IN FOR 2 MINUTES TO ACHIEVE MAX PENETRATION. WASH HANDS WELL. 1 each 2   Omega-3 Fatty Acids (FISH OIL PO) Take 1 capsule by mouth daily.     omeprazole (PRILOSEC) 40 MG capsule TAKE 1 CAPSULE BY MOUTH DAILY 90 capsule 3   polyethylene glycol powder (GLYCOLAX/MIRALAX) 17 GM/SCOOP powder Take 1 Container by mouth daily.     pregabalin (LYRICA) 25 MG capsule Take 2 capsules (50 mg total) by mouth at bedtime. For nerve pain/back pain 60 capsule 5   senna-docusate (SENOKOT S) 8.6-50 MG tablet Take 2 tablets by mouth daily. 60 tablet 5   solifenacin (VESICARE) 5 MG tablet Take 1 tablet by mouth daily.     spironolactone (ALDACTONE) 25 MG tablet TAKE 1 TABLET BY MOUTH IN THE  MORNING 90 tablet 3   telmisartan (MICARDIS) 20 MG tablet Take 1 tablet (20 mg total) by mouth daily. 90 tablet 1   Vitamin E 400 units TABS Take 400 Units by mouth daily.      No current facility-administered medications for this visit.    REVIEW OF SYSTEMS:    10 Point review of Systems was done is negative except as  noted above.   PHYSICAL EXAMINATION Telemedicine visit  LABORATORY DATA:  I have reviewed the data as listed  .    Latest Ref Rng & Units 06/22/2022    9:18 AM 01/17/2022   11:16 AM 12/22/2021    9:12 AM  CBC  WBC 4.0 - 10.5 K/uL 6.7  7.1  5.9   Hemoglobin 12.0 - 15.0 g/dL 29.0  21.1  15.5   Hematocrit 36.0 - 46.0 % 36.9  38.0  35.2   Platelets 150 - 400 K/uL 150  144  151     .    Latest Ref Rng & Units 06/22/2022    9:18 AM 12/22/2021    9:12 AM 12/04/2021   10:16 AM  CMP  Glucose 70 - 99 mg/dL 208  73  57   BUN 8 - 23 mg/dL 18  19  19    Creatinine 0.44 - 1.00 mg/dL 0.22  3.36  1.22   Sodium 135 - 145 mmol/L 133  135  134   Potassium 3.5 - 5.1 mmol/L 4.6  4.7  4.7   Chloride 98 - 111 mmol/L 102  101  99   CO2 22 - 32 mmol/L 26  28  27    Calcium 8.9 -  10.3 mg/dL 44.9  75.3  00.5   Total Protein 6.5 - 8.1 g/dL 7.8  6.5  6.9   Total Bilirubin 0.3 - 1.2 mg/dL 0.4  0.4  0.3   Alkaline Phos 38 - 126 U/L 89  69    AST 15 - 41 U/L 26  29  22    ALT 0 - 44 U/L 16  23  20       03/18/2020 Mammogram  05/30/2021 Mammogram        RADIOGRAPHIC STUDIES: I have personally reviewed the radiological images as listed and agreed with the findings in the report.  ASSESSMENT & PLAN:   89 y.o. female with  1. H/o Easily bruising - labs did not demonstrate any specific bleeding diathesis 2. Stage IB ER/PR/Her 2 negative, grade 3 left breast cancer s/p mastectomy. NegSNLBx. 06/04/2019. Was not considered to be a good candidate for adjuvant chemotherapy given her age and medical issues. 3. LUE lymphedema-left breast mastectomy and lymph node biopsy.  Controlled with use of lymphedema sleeve.  Discussed maintaining compliance with use of her sleeve.  2)  Patient Active Problem List   Diagnosis Date Noted   Atherosclerosis of native artery of both lower extremities with rest pain 07/09/2022   Interstitial pulmonary disease 07/09/2022   Sciatica associated with disorder of multiple sites of spine 03/20/2021   Bipolar II disorder 03/20/2021   Chronic pain syndrome 03/20/2021   Urinary retention 02/04/2020   Seasonal allergies 02/04/2020   Recurrent major depressive disorder, in partial remission 07/17/2019   Status post left mastectomy 07/01/2019   Breast cancer of lower-outer quadrant of left female breast 06/04/2019   Candida infection, oral 01/15/2019   Senile purpura 04/14/2018   Lumbar post-laminectomy syndrome 12/03/2017   Lumbar spondylosis 12/03/2017   History of back surgery 09/01/2017   Constipation 09/01/2017   Hypothyroidism due to acquired atrophy of thyroid 09/01/2017   Mixed hyperlipidemia 09/01/2017   Age-related osteoporosis without current pathological fracture 09/01/2017   High risk medication use 09/01/2017   Arthritis of  hand 05/17/2017   Atherosclerosis of native arteries of extremity with intermittent claudication 05/15/2017   Status post total bilateral knee replacement 12/20/2016   Gait abnormality 07/16/2016   Chronic low back pain  07/16/2016   Mild cognitive impairment 07/16/2016   Wrist pain 05/10/2015   S/P left TKA 11/29/2014   S/P knee replacement 11/29/2014   Spontaneous bruising 08/27/2014   Numbness and tingling in right hand 07/28/2014   Essential tremor 07/28/2014   Dizziness 05/28/2014   Shaky 05/28/2014   Memory loss 05/28/2014   Depression 05/06/2014   Lipoma of left upper thigh 3x5 cm 09/28/2011   Cerebral vascular accident (HCC) 08/09/2011   Syncope 08/09/2011   History of breast cancer [id number], s/p BCT 2008, triple negative 01/26/2011   Chronic L breast pain with chronic recurrent seroma, s/p excisional biopsy 01/12/2010 01/26/2011   Fainted    ICD (implantable cardioverter-defibrillator), biventricular, in situ 08/02/2010   Chronic systolic heart failure 08/02/2010   Essential hypertension 08/02/2010   -continue f/u with PCP for mx of other chronic medical issues.  PLAN: -Discussed lab results from 06/22/2022 with the patient and her daughter. CBC and CMP stable. -Discussed CT chest results from 07/14/2022 with the patient and her daughter. CT results shows no evidence of metastatic disease in the chest, including no evidence of left chest wall tumor. Fibrotic interstitial lung disease with evidence of interval progression when compared with September 09, 2020 prior. Severe coronary artery calcifications and aortic Atherosclerosis. -Recommended to follow-up with PCP or Pulmonologist.  -Patient has no clinical evidence suggestive of breast cancer recurrence/progression at this time.   FOLLOW-UP: RTC with Dr Candise CheKale with labs in 6 months    The total time spent in the appointment was 15 minutes* .  All of the patient's questions were answered with apparent satisfaction. The patient  knows to call the clinic with any problems, questions or concerns.   Wyvonnia LoraGautam Rashawn Rolon MD MS AAHIVMS [facility] Hematology/Oncology Physician [facility]  .*Total Encounter Time as defined by the Centers for Medicare and Medicaid Services includes, in addition to the face-to-face time of a patient visit (documented in the note above) non-face-to-face time: obtaining and reviewing outside history, ordering and reviewing medications, tests or procedures, care coordination (communications with other health care professionals or caregivers) and documentation in the medical record.   I, Ok EdwardsParam Shah, am acting as a Neurosurgeonscribe for Wyvonnia LoraGautam Devony Mcgrady, MD. .I have reviewed the above documentation for accuracy and completeness, and I agree with the above. Johney Maine.Mayci Haning Kishore Vielka Klinedinst MD

## 2022-07-17 ENCOUNTER — Telehealth: Payer: Self-pay | Admitting: Hematology

## 2022-07-17 NOTE — Telephone Encounter (Signed)
Called patient per 4/8 los notes to schedule f/u. Patient scheduled and notified.

## 2022-07-23 ENCOUNTER — Encounter: Payer: Self-pay | Admitting: Obstetrics and Gynecology

## 2022-07-23 NOTE — Progress Notes (Unsigned)
[facility] Urodynamics Procedure  Referring Physician: [name], NP Date of Procedure: 07/24/2022  [name] is a 89 y.o. female who presents for urodynamic evaluation. Indication(s) for study: SUI, UUI, and OAB  Vital Signs: LMP  (LMP Unknown)   Laboratory Results: A catheterized urine specimen revealed:  POC urine: Negative for all components    Voiding Diary: Not done  Procedure Timeout:  The correct patient was verified and the correct procedure was verified. The patient was in the correct position and safety precautions were reviewed based on at the patient's history.  Urodynamic Procedure A 31F dual lumen urodynamics catheter was placed under sterile conditions into the patient's bladder. A 31F catheter was placed into the rectum in order to measure abdominal pressure. EMG patches were placed in the appropriate position.  All connections were confirmed and calibrations/adjusted made. Saline was instilled into the bladder through the dual lumen catheters.  Cough/valsalva pressures were measured periodically during filling.  Patient was allowed to void.  The bladder was then emptied of its residual.  UROFLOW: Patient was unable to void, Uroflow not completed. Cathed amount of urine was 31ml  CMG: This was performed with sterile water in the sitting position at a fill rate of 20-30 mL/min.    First sensation of fullness was 83 mLs,  First urge was 117 mLs,  Strong urge was 154 mLs and  Capacity was 193 mLs  Stress incontinence was demonstrated Highest positive Barrier CLPP was 121 cmH20 at 154 ml. **Of note, a large amount of urine was lost during cough stress test (approximately 53ml) Highest positive Barrier VLPP was 44 cmH20 at 154 ml.  Detrusor function was overactive, with phasic contractions seen.  The first occurred at 107 mL to 6.6 cm of water and was associated with leakage.  Compliance:  Normal. End fill detrusor pressure was 1  cmH20.  Calculated compliance was 193 YF/RTM21  UPP: MUCP with barrier reduction was 48 cm of water.    MICTURITION STUDY: Voiding was performed with reduction using scopettes in the sitting position.  Pdet at Qmax was 21.7 cm of water.  Qmax was 4.1 mL/sec.  It was a normal pattern.  She voided 73 mL and had a residual of 120 mL.  It was not a volitional void, sustained detrusor contraction was present and abdominal straining was not present  EMG: This was performed with patches.  She had involuntary contractions, recruitment with fill was not present and urethral sphincter was relaxed with void.  The details of the procedure with the study tracings have been scanned into EPIC.   Urodynamic Impression:  1. Sensation was normal; capacity was reduced 2. Stress Incontinence was demonstrated at normal pressures; 3. Detrusor Overactivity was demonstrated with leakage. 4. Emptying was dysfunctional with a minimally elevated PVR, a sustained detrusor contraction present,  abdominal straining not present, normal urethral sphincter activity on EMG.  Plan: - The patient will follow up  to discuss the findings and treatment options.

## 2022-07-24 ENCOUNTER — Ambulatory Visit (INDEPENDENT_AMBULATORY_CARE_PROVIDER_SITE_OTHER): Payer: Medicare Other | Admitting: Obstetrics and Gynecology

## 2022-07-24 ENCOUNTER — Encounter: Payer: Self-pay | Admitting: Obstetrics and Gynecology

## 2022-07-24 VITALS — BP 106/68 | HR 73

## 2022-07-24 DIAGNOSIS — R350 Frequency of micturition: Secondary | ICD-10-CM | POA: Diagnosis not present

## 2022-07-24 DIAGNOSIS — N393 Stress incontinence (female) (male): Secondary | ICD-10-CM | POA: Diagnosis not present

## 2022-07-24 DIAGNOSIS — N3281 Overactive bladder: Secondary | ICD-10-CM | POA: Diagnosis not present

## 2022-07-24 LAB — POCT URINALYSIS DIPSTICK
Bilirubin, UA: NEGATIVE
Blood, UA: NEGATIVE
Glucose, UA: NEGATIVE
Ketones, UA: NEGATIVE
Leukocytes, UA: NEGATIVE
Nitrite, UA: NEGATIVE
Protein, UA: NEGATIVE
Spec Grav, UA: 1.015 (ref 1.010–1.025)
Urobilinogen, UA: 0.2 E.U./dL
pH, UA: 6 (ref 5.0–8.0)

## 2022-07-24 MED ORDER — TROSPIUM CHLORIDE ER 60 MG PO CP24: 60.0000 mg | ORAL_CAPSULE | Freq: Every day | ORAL | 5 refills | Status: DC

## 2022-07-24 NOTE — Patient Instructions (Signed)
Start Trospium 60mg  daily  Options for overactive bladder include: Bladder botox and medication.   Stress incontinence we have options of urethral bulking and a surgical sling.

## 2022-07-30 ENCOUNTER — Telehealth: Payer: Self-pay | Admitting: Obstetrics and Gynecology

## 2022-07-30 ENCOUNTER — Encounter: Payer: Self-pay | Admitting: Physician Assistant

## 2022-07-30 ENCOUNTER — Ambulatory Visit (INDEPENDENT_AMBULATORY_CARE_PROVIDER_SITE_OTHER): Payer: Medicare Other | Admitting: Physician Assistant

## 2022-07-30 VITALS — BP 140/76 | HR 64 | Resp 18 | Ht <= 58 in | Wt 113.0 lb

## 2022-07-30 DIAGNOSIS — G301 Alzheimer's disease with late onset: Secondary | ICD-10-CM

## 2022-07-30 DIAGNOSIS — F02A0 Dementia in other diseases classified elsewhere, mild, without behavioral disturbance, psychotic disturbance, mood disturbance, and anxiety: Secondary | ICD-10-CM | POA: Diagnosis not present

## 2022-07-30 NOTE — Telephone Encounter (Signed)
Patient called and reports she is distressed because she has no bladder control. She has been taking Trospium  daily with no relief. When asked if she had any UTI symptoms she said she might be but she has no control. Encouraged patient to come in and give a urine sample so we can rule out UTI. She plans to come tomorrow at 2pm to give a sample.

## 2022-07-30 NOTE — Progress Notes (Signed)
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 RH female with a history of hypertension, hyperlipidemia, hypothyroidism, LBBB status post ICD placement, breast cancer status postmastectomy, chronic back pain, bipolar disorder, depression, anxiety, and mild dementia likely due to Alzheimer's disease seen today in follow up for memory loss. Patient is not on antidementia medication due to undesirable side effects. Memory is stable. She is independent with her ADLs and attends [employer] 2 times a week. She no longer drives       Follow up in 6  months. Recommend good control of her cardiovascular risk factors Continue to control mood as per PCP    Subjective:    This patient is accompanied in the office by her daughter  who supplements the history.  Previous records as well as any outside records available were reviewed prior to todays visit. Patient was last seen on 01/24/2022, at which time her MMSE was 30/30    Any changes in memory since last visit?  She has some "good and bad days ".  Sometimes there is some comprehension difficulty.  She has some trouble with names and recent conversations but not a "sharp change ".  She attends [employer] twice a week, a program that she enjoys, especially word games, word search. She has also made some acquaintances repeats oneself?  Endorsed Disoriented when walking into a room?  Patient denies   Leaving objects in unusual places?    denies   Wandering behavior?  denies   Any personality changes since last visit?  denies   Any worsening depression?:  denies   Hallucinations or paranoia?  denies   Seizures?    denies    Any sleep changes?  Denies vivid dreams, REM behavior or sleepwalking   Sleep apnea?   denies   Any hygiene concerns?    denies   Independent of bathing and dressing?  Endorsed  Does the patient needs help with medications? Patient  is in charge    Who is in charge of the finances?  Daughter is in charge     Any changes in appetite?  Decreased." She says she is not hungry " She complements it with Boost  Patient have trouble swallowing?  denies   Does the patient cook?  Any kitchen accidents such as leaving the stove on? Patient denies   Any headaches?   denies   Chronic back pain  denies   Ambulates with difficulty? She likes chair Zumba Recent falls or head injuries? denies     Unilateral weakness, numbness or tingling?    denies   Any tremors?  denies   Any anosmia?  Patient denies   Any incontinence of urine?  She has a pessary.  Wakes up at night to urinate at least twice a night. Followed by Urology. Uses Depends  Any bowel dysfunction?     denies      Patient lives  with her daughter  Does the patient drive?No longer drives   History on Initial Assessment 10/13/2020: This is an 89 year old right-handed woman with a history of hypertension, hyperlipidemia, hypothyroidism, LBBB s/p ICD placement, breast cancer s/p mastectomy, chronic back pain, bipolar depression, anxiety, presenting for evaluation of memory loss. She had been following with [facility] for memory loss since 2016, records were reviewed. She started noticing mild short-term memory changes in 2004 after lumbar surgery. Over the years, memory changes  have progressed, her daughter moved in with her in 2019-09-05 and she stopped driving. She had side effects on Donepezil, Rivastigmine, and Memantine.    She states her memory is "terrible." [name] reports memory changes became more noticeable after her husband passed away in 05-Sep-2018. She had left the stove on a couple of times and left keys outside the door. She would forget what she was going to say and forget conversations from the day prior. She would repeat herself at times. She manages her own medications, [name] checks behind her. Her husband was managing finances, [name] took over when he passed away. She stopped driving  in Jan/Feb 2021 because she was having staggering gait. She is independent with dressing and bathing. Notes from [facility] indicate she was tried on Donepezil in 09-05-15, Memantine in 2016/09/04, Rivastigmine in Sep 04, 2017. She felt "like a mummy, could not think."    She denies any headaches, diplopia, dysarthria/dysphagia, neck pain, focal numbness/tingling/weakness, anosmia. She has chronic back pain and constipation. She has occasional dizziness. There are tremors in both hands affecting handwriting. Sleep is okay, she usually sleeps 8 hours at night. Her last fall was in January 2022 when she fell off the bed. She had worked with physical therapy and staggering gait has stabilized. Mood is "sometimes stable," she is more irritable with herself when she could not remember things. [name] notices she is more anxious when she has to leave. No hallucinations or paranoia. No family history of dementia, history of significant head injuries, or alcohol use.    Laboratory Data:          Lab Results  Component Value Date    TSH 0.67 11/17/2020             Lab Results  Component Value Date    VITAMINB12 1,117 (H) 07/28/2014   PREVIOUS MEDICATIONS: Rivastigmine, donepezil, memantine  CURRENT MEDICATIONS:  Outpatient Encounter Medications as of 07/30/2022  Medication Sig   acetaminophen (TYLENOL) 500 MG tablet Take 500 mg by mouth 2 (two) times daily.  in the morning and afternoon   acetaminophen (TYLENOL) 650 MG CR tablet Take 650 mg by mouth at bedtime.   albuterol (VENTOLIN HFA) 108 (90 Base) MCG/ACT inhaler Inhale 2 puffs into the lungs every 6 (six) hours as needed for wheezing or shortness of breath.   ascorbic acid (VITAMIN C) 500 MG tablet Take 500 mg by mouth daily.   buPROPion (WELLBUTRIN XL) 150 MG 24 hr tablet Take 1 tablet (150 mg total) by mouth daily.   carvedilol (COREG) 3.125 MG tablet TAKE 1 TABLET BY MOUTH TWICE  DAILY   Cholecalciferol (VITAMIN D3) 50 MCG (2000 UT) TABS Take 2,000 Units by mouth  daily.    diclofenac Sodium (VOLTAREN) 1 % GEL Apply 4 g topically 4 (four) times daily.   DULoxetine (CYMBALTA) 60 MG capsule Take 60 mg by mouth daily.   estradiol (ESTRACE) 0.1 MG/GM vaginal cream Place 0.5 g vaginally 2 (two) times a week. Place 0.5g nightly for two weeks then twice a week after   famotidine (PEPCID) 10 MG tablet Take 10 mg by mouth as needed for heartburn or indigestion.   fluticasone (FLONASE) 50 MCG/ACT nasal spray Place 2 sprays into both nostrils daily.   furosemide (LASIX) 20 MG tablet Take 1 tablet (20 mg total) by mouth daily as needed.   levothyroxine (SYNTHROID) 88 MCG tablet Take 1 tablet (88 mcg total) by mouth daily.   Lidocaine 4 % PTCH Apply 1 patch topically every 12 (  twelve) hours. Apply to back   lubiprostone (AMITIZA) 8 MCG capsule TAKE ONE CAPSULE BY MOUTH TWICE DAILY WITH A MEAL   Magnesium 250 MG TABS Take 250 mg by mouth daily.   Misc Natural Products (PUMPKIN SEED OIL) CAPS Take 1 capsule by mouth daily.   NALTREXONE HCL, PAIN, PO Take 4 mg by mouth at bedtime.   NONFORMULARY OR COMPOUNDED ITEM Apply 120 Tubes topically daily. Diclofenac/Cyclobenzaprine/lamotrigine/lidocaine/prilocaine (16109) 2%/2%/6%/5%/1.25% cream QTY: 120 GM SIG: (NEURO) APPLY 1-2 PUMPS (1-2 GMS) TO AFFECTED AREA (S) OR FOCAL POINTS 3 TO 4 TIMES DAILY. RUB IN FOR 2 MINUTES TO ACHIEVE MAX PENETRATION. WASH HANDS WELL.   Omega-3 Fatty Acids (FISH OIL PO) Take 1 capsule by mouth daily.   omeprazole (PRILOSEC) 40 MG capsule TAKE 1 CAPSULE BY MOUTH DAILY   polyethylene glycol powder (GLYCOLAX/MIRALAX) 17 GM/SCOOP powder Take 1 Container by mouth daily.   pregabalin (LYRICA) 25 MG capsule Take 2 capsules (50 mg total) by mouth at bedtime. For nerve pain/back pain   senna-docusate (SENOKOT S) 8.6-50 MG tablet Take 2 tablets by mouth daily.   spironolactone (ALDACTONE) 25 MG tablet TAKE 1 TABLET BY MOUTH IN THE  MORNING   telmisartan (MICARDIS) 20 MG tablet Take 1 tablet (20 mg total) by  mouth daily.   Trospium Chloride 60 MG CP24 Take 1 capsule (60 mg total) by mouth daily.   Vitamin E 400 units TABS Take 400 Units by mouth daily.    No facility-administered encounter medications on file as of 07/30/2022.       07/30/2022    1:00 PM 05/18/2022    3:02 PM 01/24/2022    1:00 PM  MMSE - Mini Mental State Exam  Orientation to time Orientation to Place Registration Attention/ Calculation Recall Language- name 2 objects Language- repeat Language- follow 3 step command Language- read & follow direction Write a sentence Copy design Total score 10/13/2020   10:00 AM  Montreal Cognitive Assessment   Visuospatial/ Executive (0/5) 2  Naming (0/3) 3  Attention: Read list of digits (0/2) 2  Attention: Read list of letters (0/1) 1  Attention: Serial 7 subtraction starting at 100 (0/3) 2  Language: Repeat phrase (0/2) 1  Language : Fluency (0/1) 1  Abstraction (0/2) 1  Delayed Recall (0/5) 2  Orientation (0/6) 6  Total 21  Adjusted Score (based on education) 21    Objective:     PHYSICAL EXAMINATION:    VITALS:   Vitals:   07/30/22 1242  Resp: 18  Weight: 113 lb (51.3 kg)  Height:  (1.473 m)    GEN:  The patient appears stated age and is in NAD. HEENT:  Normocephalic, atraumatic.   Neurological examination:  General: NAD, well-groomed, appears stated age. Orientation: The patient is alert. Oriented to person, place and date Cranial nerves: There is good facial symmetry.The speech is fluent and clear. No aphasia or dysarthria. Fund of knowledge is appropriate. Recent memory impaired  and remote memory is normal  Attention and concentration are normal.  Able to name objects and repeat phrases.  Hearing is intact to conversational tone.   Sensation: Sensation is intact to light touch throughout Motor: Strength is at  least antigravity x4. DTR's 2/4 in UE/LE      Movement examination: Tone: There is normal tone in the UE/LE Abnormal movements:  no tremor.  No myoclonus.  No asterixis.   Coordination:  There is no decremation with RAM's. Normal finger to nose  Gait and Station: The patient has no difficulty arising out of a deep-seated chair without the use of the hands. Uses a R cane for stability. The patient's stride length is good.  Gait is cautious and narrow.    Thank you for allowing [place] the opportunity to participate in the care of this nice patient. Please do not hesitate to contact us for any questions or concerns.   Total time spent on today's visit was 30 minutes dedicated to this patient today, preparing to see patient, examining the patient, ordering tests and/or medications and counseling the patient, documenting clinical information in the EHR or other health record, independently interpreting results and communicating results to the patient/family, discussing treatment and goals, answering patient's questions and coordinating care.  Cc:  [name], NP  Marlowe Kays 07/30/2022 1:29 PM

## 2022-07-30 NOTE — Patient Instructions (Signed)
  You are doing great! Continue atendance to the Adult Day Program  Follow in 6 months

## 2022-07-31 ENCOUNTER — Ambulatory Visit (INDEPENDENT_AMBULATORY_CARE_PROVIDER_SITE_OTHER): Payer: Medicare Other

## 2022-07-31 DIAGNOSIS — R350 Frequency of micturition: Secondary | ICD-10-CM

## 2022-07-31 NOTE — Patient Instructions (Signed)
Your Urine dip that was done in office was NEGATIVE. You can take AZO over the counter for your discomfort. We will contact you when the results are back between 3-5 days. If you have any questions or concerns please feel free to call us at [phone]

## 2022-07-31 NOTE — Progress Notes (Signed)
[name] is a 89 y.o. female arrived today with UTI sx.  A urine specimen was collected and POCT Urine was done. POCT Urine was Negative

## 2022-08-01 ENCOUNTER — Encounter
Payer: Medicare Other | Attending: Physical Medicine and Rehabilitation | Admitting: Physical Medicine and Rehabilitation

## 2022-08-01 ENCOUNTER — Encounter: Payer: Self-pay | Admitting: Physical Medicine and Rehabilitation

## 2022-08-01 VITALS — BP 117/75 | HR 76 | Ht <= 58 in | Wt 110.0 lb

## 2022-08-01 DIAGNOSIS — G629 Polyneuropathy, unspecified: Secondary | ICD-10-CM | POA: Diagnosis not present

## 2022-08-01 DIAGNOSIS — G894 Chronic pain syndrome: Secondary | ICD-10-CM | POA: Insufficient documentation

## 2022-08-01 DIAGNOSIS — M961 Postlaminectomy syndrome, not elsewhere classified: Secondary | ICD-10-CM | POA: Diagnosis not present

## 2022-08-01 MED ORDER — NALTREXONE HCL 50 MG PO TABS: 25.0000 mg | ORAL_TABLET | Freq: Every day | ORAL | Status: DC

## 2022-08-01 NOTE — Progress Notes (Signed)
Subjective:    Patient ID: [name], female    DOB: [birth year], 89 y.o.   MRN: [id number]  HPI  Pt is an 89 yr old female with hx of breast CA and RUE lymphedema, as well as uterine prolapse, NICCM- as well as Chronic low back pain with B/L sciatica- also has memory issues on Aricept; hx of lumbar fusion at L4/5. Has a hx of Bipolar Type II depression. Is on Zoloft 200 mg daily, Trilpetal and Wellbutrin for mood.  Here for f/u on Lumbar radiculopathy   Doing same- stable-nothing has changed.   Still taking Naltrexone 4 mg nightly-  Stilll has Cymbalta Lyrica- got knocked out from Lyrica 50 mg in AM  So took Lyrica 25 mg BID- could wake up better-   C/O pain more when takes Lyrica 25 mg BID- more back pain- when takes Lyrica 50 mg QHS, it knocks her out-  doesn't change amount she sleeps because gets up q1-2 hours to pee.  But it makes her more sleepy- can be sleepy until afternoon if takes 50 mg QHS.  Sometimes, if does, needs to take a nap by noon/1pm- against taking naps.  Takes "too long" per pt.   Goes to bed at 10pm and gets up at 7am.   Was also dx'd with peripheral neuropathy-         Pain Inventory Average Pain 6 Pain Right Now 6 My pain is dull  In the last 24 hours, has pain interfered with the following? General activity 0 Relation with others 0 Enjoyment of life 0 What TIME of day is your pain at its worst? morning  Sleep (in general) Fair  Pain is worse with: bending and standing Pain improves with:  . Relief from Meds: 2  Family History  Problem Relation Age of Onset   Hypertension Mother    Arthritis Mother    Hypertension Father    Hypertension Brother    Hypertension Brother    Social History   Socioeconomic History   Marital status: Married    Spouse name: Not on file   Number of children: 1   Years of education: Masters   Highest education level: Master's degree (e.g., MA, MS, MEng, MEd, MSW, MBA)  Occupational History    Occupation: Retired  Tobacco Use   Smoking status: Never    Passive exposure: Never   Smokeless tobacco: Never  Vaping Use   Vaping Use: Never used  Substance and Sexual Activity   Alcohol use: No   Drug use: No   Sexual activity: Not Currently    Birth control/protection: Post-menopausal  Other Topics Concern   Not on file  Social History Narrative   Lives at home with husband.   Right-handed.      As of 07/28/2014   Diet: No special diet   Caffeine: yes, Chocolate, tea and sodas    Married: YES, 1970   House: Yes, 2 stories, 2-3 persons live in home   Pets: No   Current/Past profession: Nurse, mental health, Publishing rights manager    Exercise: Yes 2-3 x weekly   Living Will: Yes   DNR: No   POA/HPOA: No      Social Determinants of Health   Financial Resource Strain: Low Risk  (07/05/2022)   Overall Financial Resource Strain (CARDIA)    Difficulty of Paying Living Expenses: Not very hard  Food Insecurity: No Food Insecurity (07/05/2022)   Hunger Vital Sign    Worried About Running Out of  Food in the Last Year: Never true    Ran Out of Food in the Last Year: Never true  Transportation Needs: No Transportation Needs (07/05/2022)   PRAPARE - Administrator, Civil Service (Medical): No    Lack of Transportation (Non-Medical): No  Physical Activity: Unknown (07/05/2022)   Exercise Vital Sign    Days of Exercise per Week: Patient declined    Minutes of Exercise per Session: Not on file  Stress: Patient Declined (07/05/2022)   [employer] of Occupational Health - Occupational Stress Questionnaire    Feeling of Stress : Patient declined  Social Connections: Unknown (07/05/2022)   Social Connection and Isolation Panel [NHANES]    Frequency of Communication with Friends and Family: Twice a week    Frequency of Social Gatherings with Friends and Family: Patient declined    Attends Religious Services: More than 4 times per year    Active Member of [employer] or Organizations:  Yes    Attends Banker Meetings: Not on file    Marital Status: Widowed   Past Surgical History:  Procedure Laterality Date   BACK SURGERY     lumbar fusion    BREAST LUMPECTOMY Left 2008   BREAST SURGERY  2000   LUMP REMOVAL. STAGE 1 CANCER   CARDIAC CATHETERIZATION     CATARACT EXTRACTION     COLONOSCOPY     EYE SURGERY     IMPLANTABLE CARDIOVERTER DEFIBRILLATOR GENERATOR CHANGE N/A 12/18/2012   Procedure: IMPLANTABLE CARDIOVERTER DEFIBRILLATOR GENERATOR CHANGE;  Surgeon: Marinus Maw, MD;  Location: [facility] CATH LAB;  Service: Cardiovascular;  Laterality: N/A;   JOINT REPLACEMENT  06/14/01   right   LUMBAR FUSION  2006   MASS EXCISION  11/08/2011   Procedure: EXCISION MASS;  Surgeon: Almond Lint, MD;  Location: [facility] ORS;  Service: General;  Laterality: Left;  Excision Left Thigh Mass   MASTECTOMY W/ SENTINEL NODE BIOPSY Left 06/04/2019   Procedure: LEFT MASTECTOMY WITH SENTINEL LYMPH NODE BIOPSY;  Surgeon: Almond Lint, MD;  Location: [facility] OR;  Service: General;  Laterality: Left;   MASTECTOMY, PARTIAL  2008   GOT PACEMAKER AND DEFIB AT THAT TIME   PACEMAKER INSERTION  04/23/06   TOTAL KNEE ARTHROPLASTY  05/17/01   RIGHT KNEE   TOTAL KNEE ARTHROPLASTY Left 11/29/2014   Procedure: TOTAL LEFT KNEE ARTHROPLASTY;  Surgeon: Durene Romans, MD;  Location: [facility];  Service: Orthopedics;  Laterality: Left;   Past Surgical History:  Procedure Laterality Date   BACK SURGERY     lumbar fusion    BREAST LUMPECTOMY Left 2008   BREAST SURGERY  2000   LUMP REMOVAL. STAGE 1 CANCER   CARDIAC CATHETERIZATION     CATARACT EXTRACTION     COLONOSCOPY     EYE SURGERY     IMPLANTABLE CARDIOVERTER DEFIBRILLATOR GENERATOR CHANGE N/A 12/18/2012   Procedure: IMPLANTABLE CARDIOVERTER DEFIBRILLATOR GENERATOR CHANGE;  Surgeon: Marinus Maw, MD;  Location: [facility] CATH LAB;  Service: Cardiovascular;  Laterality: N/A;   JOINT REPLACEMENT  06/14/01   right   LUMBAR FUSION  2006   MASS EXCISION  11/08/2011    Procedure: EXCISION MASS;  Surgeon: Almond Lint, MD;  Location: [facility];  Service: General;  Laterality: Left;  Excision Left Thigh Mass   MASTECTOMY W/ SENTINEL NODE BIOPSY Left 06/04/2019   Procedure: LEFT MASTECTOMY WITH SENTINEL LYMPH NODE BIOPSY;  Surgeon: Almond Lint, MD;  Location: [facility] OR;  Service: General;  Laterality: Left;   MASTECTOMY,  PARTIAL  2008   GOT PACEMAKER AND DEFIB AT THAT TIME   PACEMAKER INSERTION  04/23/06   TOTAL KNEE ARTHROPLASTY  05/17/01   RIGHT KNEE   TOTAL KNEE ARTHROPLASTY Left 11/29/2014   Procedure: TOTAL LEFT KNEE ARTHROPLASTY;  Surgeon: Durene Romans, MD;  Location: [facility];  Service: Orthopedics;  Laterality: Left;   Past Medical History:  Diagnosis Date   Arthritis    Cancer    left breast cancer    Cataracts, bilateral    removed by surgery   CHF (congestive heart failure)    PACEMAKER & DEFIB   Complication of anesthesia    hypotensive after back surgery in 2006   Depression    Dyslipidemia    Fainted 04/21/06   AT [facility]   GERD (gastroesophageal reflux disease)    Headache(784.0)    Hearing loss    bilateral hearing aids   HLD (hyperlipidemia)    diet controlled    Hypertension    Hypothyroidism    ICD (implantable cardiac defibrillator) in place    pt has pacer/icd   ICD (implantable cardiac defibrillator), biventricular, in situ    LBBB (left bundle branch block)    Memory loss    Nonischemic cardiomyopathy    Normal coronary arteries    s/p cardiac cath 2007   Pacemaker    ICD [employer]   Syncope    Systolic CHF    Vertigo    Wears glasses    BP 117/75   Pulse 76   Ht  (1.473 m)   Wt 110 lb (49.9 kg)   LMP  (LMP Unknown)   SpO2 95%   BMI 22.99 kg/m   Opioid Risk Score:   Fall Risk Score:  `1  Depression screen [facility] 2/9     08/01/2022   10:51 AM 07/09/2022   10:41 AM 05/18/2022    3:02 PM 05/16/2022    2:29 PM 04/30/2022   11:05 AM 01/22/2022   11:26 AM 10/02/2021    3:18 PM  Depression screen [facility] 2/9   Decreased Interest 0 0 0 0 0 1 0  Down, Depressed, Hopeless 0 0 0 0 0 1 0  PHQ - 2 Score 0 0 0 0 0 2 0      Review of Systems  Musculoskeletal:  Positive for back pain and gait problem.  All other systems reviewed and are negative.     Objective:   Physical Exam Awake, alert, appropriate, frail appearing, using small quad cane, NAD  Accompanied by daughter-  C/o band/TTP across low back in band      Assessment & Plan:   Pt is an 89 yr old female with hx of breast CA and RUE lymphedema, as well as uterine prolapse, NICCM- as well as Chronic low back pain with B/L sciatica- also has memory issues on Aricept; hx of lumbar fusion at L4/5. Has a hx of Bipolar Type II depression. Is on Zoloft 200 mg daily, Trilpetal and Wellbutrin for mood.  Here for f/u on Lumbar radiculopathy   Just picked up low dose Naltrexone- will call in next refill for 5 mg nightly- will call in to [facility]- suggest getting now, s if too sedating, can reduce back to 4 mg nightly-    2. Con't Cymbalta 60 mg daily- per Psychiatry  3. Will con't Lyrica 25 mg 2x/day for back/nerve pain. Doesn't want to do try 50 mg nightly since makes her too sleepy.   4. Discussed  options for pain control-    5. Educated on support stockings- can help neuropathy feel less cold, not hurt as much- I would try without support hose for warmer months.  Doesn't have low BP or a lot of welling to need ot conitnue support stockings.   6. Educate don neuropathy- gets worse with age- not another way to treat except current meds.   7. F/U in 3 months since still  titrating meds. Will reduce to 2x/year once meds stable

## 2022-08-01 NOTE — Patient Instructions (Signed)
Pt is an 89 yr old female with hx of breast CA and RUE lymphedema, as well as uterine prolapse, NICCM- as well as Chronic low back pain with B/L sciatica- also has memory issues on Aricept; hx of lumbar fusion at L4/5. Has a hx of Bipolar Type II depression. Is on Zoloft 200 mg daily, Trilpetal and Wellbutrin for mood.  Here for f/u on Lumbar radiculopathy   Just picked up low dose Naltrexone- will call in next refill for 5 mg nightly- will call in to [facility]- suggest getting now, s if too sedating, can reduce back to 4 mg nightly-    2. Con't Cymbalta 60 mg daily- per Psychiatry  3. Will con't Lyrica 25 mg 2x/day for back/nerve pain. Doesn't want to do try 50 mg nightly since makes her too sleepy.   4. Discussed options for pain control-    5. Educated on support stockings- can help neuropathy feel less cold, not hurt as much- I would try without support hose for warmer months.  Doesn't have low BP or a lot of welling to need ot conitnue support stockings.   6. Educate don neuropathy- gets worse with age- not another way to treat except current meds.   7. F/U in 3 months since still  titrating meds. Will reduce to 2x/year once meds stable

## 2022-08-02 ENCOUNTER — Ambulatory Visit (INDEPENDENT_AMBULATORY_CARE_PROVIDER_SITE_OTHER): Payer: Medicare Other

## 2022-08-02 DIAGNOSIS — I428 Other cardiomyopathies: Secondary | ICD-10-CM

## 2022-08-02 LAB — CUP PACEART REMOTE DEVICE CHECK
Battery Remaining Longevity: 11 mo
Battery Remaining Percentage: 17 %
Brady Statistic RA Percent Paced: 0 %
Brady Statistic RV Percent Paced: 100 %
Date Time Interrogation Session: 20240425041200
HighPow Impedance: 46 Ohm
Implantable Lead Connection Status: 753985
Implantable Lead Connection Status: 753985
Implantable Lead Connection Status: 753985
Implantable Lead Implant Date: 20080116
Implantable Lead Implant Date: 20080116
Implantable Lead Implant Date: 20080116
Implantable Lead Location: 753859
Implantable Lead Location: 753860
Implantable Lead Location: 753860
Implantable Lead Model: 157
Implantable Lead Model: 4469
Implantable Lead Model: 4555
Implantable Lead Serial Number: 136532
Implantable Lead Serial Number: 161542
Implantable Lead Serial Number: 473495
Implantable Pulse Generator Implant Date: 20140911
Lead Channel Impedance Value: 414 Ohm
Lead Channel Impedance Value: 502 Ohm
Lead Channel Impedance Value: 854 Ohm
Lead Channel Pacing Threshold Amplitude: 0.6 V
Lead Channel Pacing Threshold Amplitude: 0.7 V
Lead Channel Pacing Threshold Amplitude: 1 V
Lead Channel Pacing Threshold Pulse Width: 0.4 ms
Lead Channel Pacing Threshold Pulse Width: 0.4 ms
Lead Channel Pacing Threshold Pulse Width: 0.8 ms
Lead Channel Setting Pacing Amplitude: 2 V
Lead Channel Setting Pacing Amplitude: 2 V
Lead Channel Setting Pacing Amplitude: 2.2 V
Lead Channel Setting Pacing Pulse Width: 0.4 ms
Lead Channel Setting Pacing Pulse Width: 0.8 ms
Lead Channel Setting Sensing Sensitivity: 0.6 mV
Lead Channel Setting Sensing Sensitivity: 1 mV
Pulse Gen Serial Number: 111765

## 2022-08-08 ENCOUNTER — Ambulatory Visit (INDEPENDENT_AMBULATORY_CARE_PROVIDER_SITE_OTHER): Payer: Medicare Other | Admitting: Obstetrics and Gynecology

## 2022-08-08 ENCOUNTER — Encounter: Payer: Self-pay | Admitting: Obstetrics and Gynecology

## 2022-08-08 VITALS — BP 107/66 | HR 86

## 2022-08-08 DIAGNOSIS — N393 Stress incontinence (female) (male): Secondary | ICD-10-CM

## 2022-08-08 DIAGNOSIS — N3281 Overactive bladder: Secondary | ICD-10-CM

## 2022-08-08 NOTE — Progress Notes (Signed)
[facility] Return Visit  SUBJECTIVE  History of Present Illness: Angelmarie Ponzo is a 89 y.o. female seen in follow-up for mixed incontinence and prolapse. She underwent urodynamic testing.   Urodynamic Impression:  1. Sensation was normal; capacity was reduced 2. Stress Incontinence was demonstrated at normal pressures; 3. Detrusor Overactivity was demonstrated with leakage. 4. Emptying was dysfunctional with a minimally elevated PVR, a sustained detrusor contraction present,  abdominal straining not present, normal urethral sphincter activity on EMG.  Has been on trospium about 2 weeks. She feels like it is maybe helping the leakage some. She is taking the pumpkin seed oil and feels she may also have seen improvement with this.   Currently has a ring with support pessary (last cleaned 4/16).  She has tried Programme researcher, broadcasting/film/video, Medical illustrator for OAB. Has a pacemaker and not eligible for PTNS. Does not want another procedure.   Past Medical History: Patient  has a past medical history of Arthritis, Cancer (HCC), Cataracts, bilateral, CHF (congestive heart failure) (HCC), Complication of anesthesia, Depression, Dyslipidemia, Fainted (04/21/06), GERD (gastroesophageal reflux disease), Headache(784.0), Hearing loss, HLD (hyperlipidemia), Hypertension, Hypothyroidism, ICD (implantable cardiac defibrillator) in place, ICD (implantable cardiac defibrillator), biventricular, in situ, LBBB (left bundle branch block), Memory loss, Nonischemic cardiomyopathy (HCC), Normal coronary arteries, Pacemaker, Syncope, Systolic CHF (HCC), Vertigo, and Wears glasses.   Past Surgical History: She  has a past surgical history that includes Total knee arthroplasty (05/17/01); Lumbar fusion (2006); Mastectomy, partial (2008); Breast surgery (2000); Joint replacement (06/14/01); Pacemaker insertion (04/23/06); Back surgery; Cardiac catheterization; Mass excision (11/08/2011); implantable cardioverter  defibrillator generator change (N/A, 12/18/2012); Cataract extraction; Eye surgery; Total knee arthroplasty (Left, 11/29/2014); Breast lumpectomy (Left, 2008); Colonoscopy; and Mastectomy w/ sentinel node biopsy (Left, 06/04/2019).   Medications: She has a current medication list which includes the following prescription(s): acetaminophen, acetaminophen, albuterol, ascorbic acid, bupropion, carvedilol, vitamin d3, diclofenac sodium, duloxetine, estradiol, famotidine, fluticasone, furosemide, levothyroxine, levothyroxine, lidocaine, lubiprostone, magnesium, pumpkin seed oil, naltrexone, naltrexone hcl (pain), NONFORMULARY OR COMPOUNDED ITEM, omega-3 fatty acids, omeprazole, polyethylene glycol powder, pregabalin, senna-docusate, spironolactone, telmisartan, trospium chloride, and vitamin e.   Allergies: Patient is allergic to iodine, shellfish allergy, memantine, aspirin, and codeine.   Social History: Patient  reports that she has never smoked. She has never been exposed to tobacco smoke. She has never used smokeless tobacco. She reports that she does not drink alcohol and does not use drugs.      OBJECTIVE     Physical Exam: Vitals:   08/08/22 1403  BP: 107/66  Pulse: 86   Gen: No apparent distress, A&O x 3.  Detailed Urogynecologic Evaluation:  Deferred. Prior exam showed:  POP-Q (05/23/22)   1.5                                            Aa   1.5                                           Ba   -4  C    4                                            Gh   3                                            Pb   7                                            tvl    -2                                            Ap   -2                                            Bp   -4.5                                              D      ASSESSMENT AND PLAN    Ms. Shindler is a 89 y.o. with:  1. SUI (stress urinary incontinence, female)   2. Overactive  bladder     - She is not interested in undergoing surgery for her prolapse at this time. The pessary works well for her overall.  - For leakage we reviewed options of sling vs urethral bulking. She prefers an office procedure with urethral bulking (Bulkamid). We discussed success rate of approximately 70-80% and possible need for second injection. We reviewed that this is not a permanent procedure and the Bulkamid does dissolve over time. Risks reviewed including injury to bladder or urethra, UTI, urinary retention and hematuria.  - Continue with trospium and pumpkin seed oil for OAB symptoms.   Return for urethral bulking in August and will plan to clean the pessary at that time  Marguerita Beards, MD

## 2022-08-13 ENCOUNTER — Other Ambulatory Visit: Payer: Self-pay | Admitting: Nurse Practitioner

## 2022-08-13 ENCOUNTER — Other Ambulatory Visit: Payer: Medicare Other

## 2022-08-13 DIAGNOSIS — E039 Hypothyroidism, unspecified: Secondary | ICD-10-CM

## 2022-08-13 NOTE — Telephone Encounter (Signed)
Patient medication has Allergy Contraindications. Medication pend and sent to PCP [name], NP .

## 2022-08-14 LAB — TSH: TSH: 0.8 mIU/L (ref 0.40–4.50)

## 2022-08-28 NOTE — Progress Notes (Signed)
Remote ICD transmission.   

## 2022-09-10 ENCOUNTER — Other Ambulatory Visit: Payer: Self-pay | Admitting: Nurse Practitioner

## 2022-09-10 DIAGNOSIS — K5904 Chronic idiopathic constipation: Secondary | ICD-10-CM

## 2022-09-10 DIAGNOSIS — I10 Essential (primary) hypertension: Secondary | ICD-10-CM

## 2022-09-11 NOTE — Telephone Encounter (Signed)
Pharmacy requested refill. Pended Rx and sent to Jessica for approval due to HIGH ALERT Warning.  

## 2022-09-26 ENCOUNTER — Ambulatory Visit: Payer: Medicare Other | Admitting: Obstetrics and Gynecology

## 2022-10-25 ENCOUNTER — Telehealth: Payer: Medicare Other | Admitting: *Deleted

## 2022-10-25 NOTE — Telephone Encounter (Signed)
Initiated Prior Authorization for Prolia through [facility] and it was APPROVED 10/25/22-10/25/2023  Authorization #: [id number]  Copy sent to scanning.

## 2022-10-31 ENCOUNTER — Encounter: Payer: Self-pay | Admitting: Physical Medicine and Rehabilitation

## 2022-10-31 ENCOUNTER — Encounter
Payer: Medicare Other | Attending: Physical Medicine and Rehabilitation | Admitting: Physical Medicine and Rehabilitation

## 2022-10-31 VITALS — BP 134/76 | HR 78 | Ht <= 58 in | Wt 112.2 lb

## 2022-10-31 DIAGNOSIS — M961 Postlaminectomy syndrome, not elsewhere classified: Secondary | ICD-10-CM | POA: Diagnosis present

## 2022-10-31 DIAGNOSIS — Z853 Personal history of malignant neoplasm of breast: Secondary | ICD-10-CM

## 2022-10-31 DIAGNOSIS — M5416 Radiculopathy, lumbar region: Secondary | ICD-10-CM | POA: Diagnosis not present

## 2022-10-31 DIAGNOSIS — G8929 Other chronic pain: Secondary | ICD-10-CM | POA: Diagnosis present

## 2022-10-31 DIAGNOSIS — R413 Other amnesia: Secondary | ICD-10-CM

## 2022-10-31 DIAGNOSIS — G629 Polyneuropathy, unspecified: Secondary | ICD-10-CM

## 2022-10-31 DIAGNOSIS — R269 Unspecified abnormalities of gait and mobility: Secondary | ICD-10-CM

## 2022-10-31 DIAGNOSIS — M5442 Lumbago with sciatica, left side: Secondary | ICD-10-CM | POA: Diagnosis present

## 2022-10-31 DIAGNOSIS — G894 Chronic pain syndrome: Secondary | ICD-10-CM

## 2022-10-31 DIAGNOSIS — M5441 Lumbago with sciatica, right side: Secondary | ICD-10-CM | POA: Insufficient documentation

## 2022-10-31 MED ORDER — METAXALONE 800 MG PO TABS: 800.0000 mg | ORAL_TABLET | Freq: Three times a day (TID) | ORAL | 5 refills | Status: DC | PRN

## 2022-10-31 MED ORDER — PREGABALIN 25 MG PO CAPS: 50.0000 mg | ORAL_CAPSULE | Freq: Every evening | ORAL | 5 refills | Status: DC

## 2022-10-31 NOTE — Patient Instructions (Signed)
Pt is an 89 yr old female with hx of breast CA and RUE lymphedema, as well as uterine prolapse, NICCM- as well as Chronic low back pain with B/L sciatica- also has memory issues on Aricept; hx of lumbar fusion at L4/5. Has a hx of Bipolar Type II depression. Is on Zoloft 200 mg daily, Trilpetal and Wellbutrin for mood.  Here for f/u on Lumbar radiculopathy   Has tried Robaxin and Flexeril and Zanaflex in remote past and all of them made her sedated.   2. So, the only choice for muscle spasms is Skelaxin/Metaxalone-  so will write for 800 mg up to 3x/day- #60- take no more than 5 days/week to reduce body getting used ot medicine- can start with 400 mg so 1/2 tab- it's scored to break in half.   3. Con't Lyrica 25-50 mg nightly and Duloxetine/Cymbalta 60 mg daily  4. Con't Low dose naltrexone 5 mg nightly. - has refills. At [facility]   5. Send new Rx's to [facility]. - sent today .   6. Pedal bike might be appropriate-  _ I think it might be a more effective way than walking-   Adjustable tension- for resistance.   7. Also have one called an elliptical - it's smoother and can help knee/hip joints more, but it's just an option.    8. F/U 3 months-  But call me in 1 months to let me know how things going.

## 2022-10-31 NOTE — Progress Notes (Signed)
Subjective:    Patient ID: [name], female    DOB: 08-Dec-1933, 89 y.o.   MRN: [id number]  HPI  Pt is an 89 yr old female with hx of breast CA and RUE lymphedema, as well as uterine prolapse, NICCM- as well as Chronic low back pain with B/L sciatica- also has memory issues on Aricept; hx of lumbar fusion at L4/5. Has a hx of Bipolar Type II depression. Is on Zoloft 200 mg daily, Trilpetal and Wellbutrin for mood.  Here for f/u on Lumbar radiculopathy   Tolerating 5 mg Low dose Naltrexone-  Pain has changed-  instead of low back that radiating up back  Now in buttocks and radiates down to ankles B/L Waking up more with spasms in the legs.    Most mornings when gets up, has spasms.  Neuropathy has gotten worse, unfortunately.   Doesn't want another back surgery.   Pain Inventory Average Pain 6 Pain Right Now 6 My pain is dull  In the last 24 hours, has pain interfered with the following? General activity 6 Relation with others 0 Enjoyment of life 0 What TIME of day is your pain at its worst? evening Sleep (in general) Fair  Pain is worse with: bending, sitting, inactivity, and some activites Pain improves with: rest, therapy/exercise, and pacing activities Relief from Meds:  na  Family History  Problem Relation Age of Onset   Hypertension Mother    Arthritis Mother    Hypertension Father    Hypertension Brother    Hypertension Brother    Social History   Socioeconomic History   Marital status: Married    Spouse name: Not on file   Number of children: 1   Years of education: Masters   Highest education level: Master's degree (e.g., MA, MS, MEng, MEd, MSW, MBA)  Occupational History   Occupation: Retired  Tobacco Use   Smoking status: Never    Passive exposure: Never   Smokeless tobacco: Never  Vaping Use   Vaping status: Never Used  Substance and Sexual Activity   Alcohol use: No   Drug use: No   Sexual activity: Not Currently    Birth  control/protection: Post-menopausal  Other Topics Concern   Not on file  Social History Narrative   Lives at home with husband.   Right-handed.      As of 07/28/2014   Diet: No special diet   Caffeine: yes, Chocolate, tea and sodas    Married: YES, 1970   House: Yes, 2 stories, 2-3 persons live in home   Pets: No   Current/Past profession: Nurse, mental health, Publishing rights manager    Exercise: Yes 2-3 x weekly   Living Will: Yes   DNR: No   POA/HPOA: No      Social Determinants of Health   Financial Resource Strain: Low Risk  (07/05/2022)   Overall Financial Resource Strain (CARDIA)    Difficulty of Paying Living Expenses: Not very hard  Food Insecurity: No Food Insecurity (07/05/2022)   Hunger Vital Sign    Worried About Running Out of Food in the Last Year: Never true    Ran Out of Food in the Last Year: Never true  Transportation Needs: No Transportation Needs (07/05/2022)   PRAPARE - Administrator, Civil Service (Medical): No    Lack of Transportation (Non-Medical): No  Physical Activity: Unknown (07/05/2022)   Exercise Vital Sign    Days of Exercise per Week: Patient declined    Minutes  of Exercise per Session: Not on file  Stress: Patient Declined (07/05/2022)   [employer] of Occupational Health - Occupational Stress Questionnaire    Feeling of Stress : Patient declined  Social Connections: Unknown (07/05/2022)   Social Connection and Isolation Panel [NHANES]    Frequency of Communication with Friends and Family: Twice a week    Frequency of Social Gatherings with Friends and Family: Patient declined    Attends Religious Services: More than 4 times per year    Active Member of [employer] or Organizations: Yes    Attends Banker Meetings: Not on file    Marital Status: Widowed   Past Surgical History:  Procedure Laterality Date   BACK SURGERY     lumbar fusion    BREAST LUMPECTOMY Left 2008   BREAST SURGERY  2000   LUMP REMOVAL. STAGE 1 CANCER    CARDIAC CATHETERIZATION     CATARACT EXTRACTION     COLONOSCOPY     EYE SURGERY     IMPLANTABLE CARDIOVERTER DEFIBRILLATOR GENERATOR CHANGE N/A 12/18/2012   Procedure: IMPLANTABLE CARDIOVERTER DEFIBRILLATOR GENERATOR CHANGE;  Surgeon: Marinus Maw, MD;  Location: [facility] CATH LAB;  Service: Cardiovascular;  Laterality: N/A;   JOINT REPLACEMENT  06/14/01   right   LUMBAR FUSION  2006   MASS EXCISION  11/08/2011   Procedure: EXCISION MASS;  Surgeon: Almond Lint, MD;  Location: [facility] ORS;  Service: General;  Laterality: Left;  Excision Left Thigh Mass   MASTECTOMY W/ SENTINEL NODE BIOPSY Left 06/04/2019   Procedure: LEFT MASTECTOMY WITH SENTINEL LYMPH NODE BIOPSY;  Surgeon: Almond Lint, MD;  Location: [facility] OR;  Service: General;  Laterality: Left;   MASTECTOMY, PARTIAL  2008   GOT PACEMAKER AND DEFIB AT THAT TIME   PACEMAKER INSERTION  04/23/06   TOTAL KNEE ARTHROPLASTY  05/17/01   RIGHT KNEE   TOTAL KNEE ARTHROPLASTY Left 11/29/2014   Procedure: TOTAL LEFT KNEE ARTHROPLASTY;  Surgeon: Durene Romans, MD;  Location: [facility];  Service: Orthopedics;  Laterality: Left;   Past Surgical History:  Procedure Laterality Date   BACK SURGERY     lumbar fusion    BREAST LUMPECTOMY Left 2008   BREAST SURGERY  2000   LUMP REMOVAL. STAGE 1 CANCER   CARDIAC CATHETERIZATION     CATARACT EXTRACTION     COLONOSCOPY     EYE SURGERY     IMPLANTABLE CARDIOVERTER DEFIBRILLATOR GENERATOR CHANGE N/A 12/18/2012   Procedure: IMPLANTABLE CARDIOVERTER DEFIBRILLATOR GENERATOR CHANGE;  Surgeon: Marinus Maw, MD;  Location[facility] CATH LAB;  Service: Cardiovascular;  Laterality: N/A;   JOINT REPLACEMENT  06/14/01   right   LUMBAR FUSION  2006   MASS EXCISION  11/08/2011   Procedure: EXCISION MASS;  Surgeon: Almond Lint, MD;  Location: [facility];  Service: General;  Laterality: Left;  Excision Left Thigh Mass   MASTECTOMY W/ SENTINEL NODE BIOPSY Left 06/04/2019   Procedure: LEFT MASTECTOMY WITH SENTINEL LYMPH NODE BIOPSY;  Surgeon:  Almond Lint, MD;  Location: [facility] OR;  Service: General;  Laterality: Left;   MASTECTOMY, PARTIAL  2008   GOT PACEMAKER AND DEFIB AT THAT TIME   PACEMAKER INSERTION  04/23/06   TOTAL KNEE ARTHROPLASTY  05/17/01   RIGHT KNEE   TOTAL KNEE ARTHROPLASTY Left 11/29/2014   Procedure: TOTAL LEFT KNEE ARTHROPLASTY;  Surgeon: Durene Romans, MD;  Location: [facility];  Service: Orthopedics;  Laterality: Left;   Past Medical History:  Diagnosis Date   Arthritis  Cancer [facility])    left breast cancer    Cataracts, bilateral    removed by surgery   CHF (congestive heart failure) (HCC)    PACEMAKER & DEFIB   Complication of anesthesia    hypotensive after back surgery in 2006   Depression    Dyslipidemia    Fainted 04/21/06   AT [facility]   GERD (gastroesophageal reflux disease)    Headache(784.0)    Hearing loss    bilateral hearing aids   HLD (hyperlipidemia)    diet controlled    Hypertension    Hypothyroidism    ICD (implantable cardiac defibrillator) in place    pt has pacer/icd   ICD (implantable cardiac defibrillator), biventricular, in situ    LBBB (left bundle branch block)    Memory loss    Nonischemic cardiomyopathy (HCC)    Normal coronary arteries    s/p cardiac cath 2007   Pacemaker    ICD [facility]   Syncope    Systolic CHF [facility])    Vertigo    Wears glasses    BP 134/76   Pulse 78   Ht 4\' 10"  (1.473 m)   Wt 112 lb 3.2 oz (50.9 kg)   LMP  (LMP Unknown)   SpO2 93%   BMI 23.45 kg/m   Opioid Risk Score:   Fall Risk Score:  `1  Depression screen [facility] 2/9     08/01/2022   10:51 AM 07/09/2022   10:41 AM 05/18/2022    3:02 PM 05/16/2022    2:29 PM 04/30/2022   11:05 AM 01/22/2022   11:26 AM 10/02/2021    3:18 PM  Depression screen [facility] 2/9  Decreased Interest 0 0 0 0 0 1 0  Down, Depressed, Hopeless 0 0 0 0 0 1 0  PHQ - 2 Score 0 0 0 0 0 2 0      Review of Systems  Musculoskeletal:  Positive for back pain.       Bilateral back of leg pain  All other systems  reviewed and are negative.     Objective:   Physical Exam  Awake, alert, appropriate, uses single point cane with quad bottom Accompanied by daughter Extremely TTP in band across low back      Assessment & Plan:   Pt is an 89 yr old female with hx of breast CA and RUE lymphedema, as well as uterine prolapse, NICCM- as well as Chronic low back pain with B/L sciatica- also has memory issues on Aricept; hx of lumbar fusion at L4/5. Has a hx of Bipolar Type II depression. Is on Zoloft 200 mg daily, Trilpetal and Wellbutrin for mood.  Here for f/u on Lumbar radiculopathy   Has tried Robaxin and Flexeril and Zanaflex in remote past and all of them made her sedated.   2. So, the only choice for muscle spasms is Skelaxin/Metaxalone-  so will write for 800 mg up to 3x/day- #60- take no more than 5 days/week to reduce body getting used ot medicine- can start with 400 mg so 1/2 tab- it's scored to break in half.   3. Con't Lyrica 25-50 mg nightly and Duloxetine/Cymbalta 60 mg daily  4. Con't Low dose naltrexone 5 mg nightly. - has refills. At [facility]   5. Send new Rx's to [facility]. - sent today .   6. Pedal bike might be appropriate-  _ I think it might be a more effective way than walking-   Adjustable  tension- for resistance.   7. Also have one called an elliptical - it's smoother and can help knee/hip joints more, but it's just an option.    8. F/U 3 months-  But call me in 1 months to let me know how things going.    I spent a total of 30   minutes on total care today- >50% coordination of care- due to  Discussion of muscle relaxants/options and went over nerve pain meds again- have tried them all. Also how to work on Print production planner. Which could help pain.

## 2022-11-01 ENCOUNTER — Ambulatory Visit: Payer: Medicare Other

## 2022-11-01 DIAGNOSIS — I428 Other cardiomyopathies: Secondary | ICD-10-CM | POA: Diagnosis not present

## 2022-11-01 LAB — CUP PACEART REMOTE DEVICE CHECK
Battery Remaining Longevity: 8 mo
Battery Remaining Percentage: 13 %
Brady Statistic RA Percent Paced: 0 %
Brady Statistic RV Percent Paced: 100 %
Date Time Interrogation Session: 20240725041200
HighPow Impedance: 51 Ohm
Implantable Lead Connection Status: 753985
Implantable Lead Connection Status: 753985
Implantable Lead Connection Status: 753985
Implantable Lead Implant Date: 20080116
Implantable Lead Implant Date: 20080116
Implantable Lead Implant Date: 20080116
Implantable Lead Location: 753859
Implantable Lead Location: 753860
Implantable Lead Location: 753860
Implantable Lead Model: 157
Implantable Lead Model: 4469
Implantable Lead Model: 4555
Implantable Lead Serial Number: 136532
Implantable Lead Serial Number: 161542
Implantable Lead Serial Number: 473495
Implantable Pulse Generator Implant Date: 20140911
Lead Channel Impedance Value: 413 Ohm
Lead Channel Impedance Value: 497 Ohm
Lead Channel Impedance Value: 805 Ohm
Lead Channel Pacing Threshold Amplitude: 0.6 V
Lead Channel Pacing Threshold Amplitude: 0.7 V
Lead Channel Pacing Threshold Amplitude: 1 V
Lead Channel Pacing Threshold Pulse Width: 0.4 ms
Lead Channel Pacing Threshold Pulse Width: 0.4 ms
Lead Channel Pacing Threshold Pulse Width: 0.8 ms
Lead Channel Setting Pacing Amplitude: 2 V
Lead Channel Setting Pacing Amplitude: 2 V
Lead Channel Setting Pacing Amplitude: 2.2 V
Lead Channel Setting Pacing Pulse Width: 0.4 ms
Lead Channel Setting Pacing Pulse Width: 0.8 ms
Lead Channel Setting Sensing Sensitivity: 0.6 mV
Lead Channel Setting Sensing Sensitivity: 1 mV
Pulse Gen Serial Number: 111765

## 2022-11-09 NOTE — Progress Notes (Signed)
Remote ICD transmission.   

## 2022-11-12 ENCOUNTER — Encounter: Payer: Self-pay | Admitting: Nurse Practitioner

## 2022-11-12 ENCOUNTER — Ambulatory Visit (INDEPENDENT_AMBULATORY_CARE_PROVIDER_SITE_OTHER): Payer: Medicare Other | Admitting: Nurse Practitioner

## 2022-11-12 VITALS — BP 118/70 | HR 72 | Temp 97.1°F | Ht <= 58 in | Wt 117.0 lb

## 2022-11-12 DIAGNOSIS — I5022 Chronic systolic (congestive) heart failure: Secondary | ICD-10-CM

## 2022-11-12 DIAGNOSIS — R1030 Lower abdominal pain, unspecified: Secondary | ICD-10-CM

## 2022-11-12 DIAGNOSIS — F3181 Bipolar II disorder: Secondary | ICD-10-CM | POA: Diagnosis not present

## 2022-11-12 DIAGNOSIS — N3281 Overactive bladder: Secondary | ICD-10-CM

## 2022-11-12 DIAGNOSIS — M5430 Sciatica, unspecified side: Secondary | ICD-10-CM

## 2022-11-12 DIAGNOSIS — E039 Hypothyroidism, unspecified: Secondary | ICD-10-CM | POA: Diagnosis not present

## 2022-11-12 DIAGNOSIS — M539 Dorsopathy, unspecified: Secondary | ICD-10-CM

## 2022-11-12 LAB — POCT URINALYSIS DIPSTICK
Bilirubin, UA: NEGATIVE
Blood, UA: NEGATIVE
Glucose, UA: NEGATIVE
Ketones, UA: NEGATIVE
Nitrite, UA: NEGATIVE
Protein, UA: NEGATIVE
Spec Grav, UA: 1.01 (ref 1.010–1.025)
Urobilinogen, UA: 0.2 E.U./dL
pH, UA: 7 (ref 5.0–8.0)

## 2022-11-12 NOTE — Progress Notes (Unsigned)
Careteam: Patient Care Team: Stacey Seller, NP as PCP - General (Geriatric Medicine) Stacey Maw, MD as PCP - Electrophysiology (Cardiology) Stacey Lint, MD as Consulting Physician (General Surgery) Stacey Romans, MD as Consulting Physician (Orthopedic Surgery) Blima Ledger, OD (Optometry) Stacey Feinstein, MD as Consulting Physician (Neurology) Stacey Maw, MD as Consulting Physician (Cardiology) Marcellina Millin, MD (Psychiatry)  PLACE OF SERVICE:  [facility]  Advanced Directive information Does Patient Have a Medical Advance Directive?: Yes, Type of Advance Directive: Living will;Out of facility DNR (pink MOST or yellow form), Pre-existing out of facility DNR order (yellow form or pink MOST form): Yellow form placed in chart (order not valid for inpatient use), Does patient want to make changes to medical advance directive?: No - Patient declined  Allergies  Allergen Reactions   Iodine Shortness Of Breath    Iodine contrast, CHF , SOB   Shellfish Allergy Shortness Of Breath   Memantine     Malaise, fogginess/ couldn't think   Aspirin Nausea And Vomiting   Codeine Nausea And Vomiting    Chief Complaint  Patient presents with   Medical Management of Chronic Issues    Routine visit. Discuss need for flu vaccine (out of stock). Patient c/o stomach ache. Sleepy during the day, and not resting well at night (has to get up to void). Here with daughter, [name]     HPI: Patient is a 89 y.o. female for routine follow up.   Getting up 5 times at night to get up to go to the bathroom. Does not urinate more during the day. Plans to have a procedure to help with the urgency and frequency. She has been on several medications through urology for OAB.   She was having a cramping pain in lower abdomen started 2 days ago, less yesterday then went away but now coming back.  Bowels moving regularly. No nausea or vomiting.  Eating regularly. She was not able to eat when it  started but now better.  Pain of 5/10.  Worse when bladder gets full better when she empties. No pain with urination.  Does not drink a lot of fluids.   Seeing physical medicine due to back pain.  She continues to have back pain and neuropathy She is taking lyrica and cymbalta for neuropathy  She was started on skelaxin for muscle spasm, she felt like it was helping but makes her sleepy.  Does not consisently help pain.   On amitiza and miralax for constipation.   Mood has been stable overall. No significant mood swings or depression.   Review of Systems:  Review of Systems  Constitutional:  Negative for chills, fever and weight loss.  HENT:  Negative for tinnitus.   Respiratory:  Negative for cough, sputum production and shortness of breath.   Cardiovascular:  Negative for chest pain, palpitations and leg swelling.  Gastrointestinal:  Negative for abdominal pain, constipation, diarrhea and heartburn.  Genitourinary:  Positive for frequency. Negative for dysuria and urgency.  Musculoskeletal:  Positive for back pain, joint pain and myalgias. Negative for falls.  Neurological:  Positive for dizziness. Negative for headaches.  Psychiatric/Behavioral:  Negative for depression and memory loss. The patient does not have insomnia.     Past Medical History:  Diagnosis Date   Arthritis    Cancer (HCC)    left breast cancer    Cataracts, bilateral    removed by surgery   CHF (congestive heart failure) (HCC)    PACEMAKER &  DEFIB   Complication of anesthesia    hypotensive after back surgery in 2006   Depression    Dyslipidemia    Fainted 04/21/06   AT [facility]   GERD (gastroesophageal reflux disease)    Headache(784.0)    Hearing loss    bilateral hearing aids   HLD (hyperlipidemia)    diet controlled    Hypertension    Hypothyroidism    ICD (implantable cardiac defibrillator) in place    pt has pacer/icd   ICD (implantable cardiac defibrillator), biventricular, in situ     LBBB (left bundle branch block)    Memory loss    Nonischemic cardiomyopathy (HCC)    Normal coronary arteries    s/p cardiac cath 2007   Pacemaker    ICD [employer]   Syncope    Systolic CHF [facility])    Vertigo    Wears glasses    Past Surgical History:  Procedure Laterality Date   BACK SURGERY     lumbar fusion    BREAST LUMPECTOMY Left 2008   BREAST SURGERY  2000   LUMP REMOVAL. STAGE 1 CANCER   CARDIAC CATHETERIZATION     CATARACT EXTRACTION     COLONOSCOPY     EYE SURGERY     IMPLANTABLE CARDIOVERTER DEFIBRILLATOR GENERATOR CHANGE N/A 12/18/2012   Procedure: IMPLANTABLE CARDIOVERTER DEFIBRILLATOR GENERATOR CHANGE;  Surgeon: Stacey Maw, MD;  Location: [facility] CATH LAB;  Service: Cardiovascular;  Laterality: N/A;   JOINT REPLACEMENT  06/14/01   right   LUMBAR FUSION  2006   MASS EXCISION  11/08/2011   Procedure: EXCISION MASS;  Surgeon: Stacey Lint, MD;  Location: [facility] ORS;  Service: General;  Laterality: Left;  Excision Left Thigh Mass   MASTECTOMY W/ SENTINEL NODE BIOPSY Left 06/04/2019   Procedure: LEFT MASTECTOMY WITH SENTINEL LYMPH NODE BIOPSY;  Surgeon: Stacey Lint, MD;  Location: [facility] OR;  Service: General;  Laterality: Left;   MASTECTOMY, PARTIAL  2008   GOT PACEMAKER AND DEFIB AT THAT TIME   PACEMAKER INSERTION  04/23/06   TOTAL KNEE ARTHROPLASTY  05/17/01   RIGHT KNEE   TOTAL KNEE ARTHROPLASTY Left 11/29/2014   Procedure: TOTAL LEFT KNEE ARTHROPLASTY;  Surgeon: Stacey Romans, MD;  Location: [facility] ORS;  Service: Orthopedics;  Laterality: Left;   Social History:   reports that she has never smoked. She has never been exposed to tobacco smoke. She has never used smokeless tobacco. She reports that she does not drink alcohol and does not use drugs.  Family History  Problem Relation Age of Onset   Hypertension Mother    Arthritis Mother    Hypertension Father    Hypertension Brother    Hypertension Brother     Medications: Patient's Medications  New Prescriptions    No medications on file  Previous Medications   ACETAMINOPHEN (TYLENOL) 650 MG CR TABLET    Take 650 mg by mouth at bedtime.   ALBUTEROL (VENTOLIN HFA) 108 (90 BASE) MCG/ACT INHALER    Inhale 2 puffs into the lungs every 6 (six) hours as needed for wheezing or shortness of breath.   ASCORBIC ACID (VITAMIN C) 500 MG TABLET    Take 500 mg by mouth daily.   BUPROPION (WELLBUTRIN XL) 150 MG 24 HR TABLET    Take 1 tablet (150 mg total) by mouth daily.   CARVEDILOL (COREG) 3.125 MG TABLET    TAKE 1 TABLET BY MOUTH TWICE  DAILY   CHOLECALCIFEROL (VITAMIN D3) 50 MCG (2000 UT) TABS  Take 2,000 Units by mouth daily.    DICLOFENAC SODIUM (PENNSAID) 2 % SOLN    APPLY 2 PUMPS (40GM) TO AFFECTED AREA UP TO 2 TIMES PER DAY AS DIRECTED.   DULOXETINE (CYMBALTA) 60 MG CAPSULE    Take 60 mg by mouth daily.   ESTRADIOL (ESTRACE) 0.1 MG/GM VAGINAL CREAM    Place 0.5 g vaginally 2 (two) times a week. Place 0.5g nightly for two weeks then twice a week after   FAMOTIDINE (PEPCID) 10 MG TABLET    Take 10 mg by mouth as needed for heartburn or indigestion.   FUROSEMIDE (LASIX) 20 MG TABLET    Take 1 tablet (20 mg total) by mouth daily as needed.   LEVOTHYROXINE (SYNTHROID) 88 MCG TABLET    Take 1 tablet (88 mcg total) by mouth daily.   LIDOCAINE 4 % PTCH    Apply 1 patch topically every 12 (twelve) hours. Apply to back   LUBIPROSTONE (AMITIZA) 8 MCG CAPSULE    TAKE ONE CAPSULE BY MOUTH TWICE DAILY WITH A MEAL   MAGNESIUM 250 MG TABS    Take 250 mg by mouth daily.   METAXALONE (SKELAXIN) 800 MG TABLET    Take 1 tablet (800 mg total) by mouth 3 (three) times daily as needed for muscle spasms.   MISC NATURAL PRODUCTS (PUMPKIN SEED OIL) CAPS    Take 1 capsule by mouth daily.   NALTREXONE HCL, PAIN, PO    Take 4 mg by mouth at bedtime.   NONFORMULARY OR COMPOUNDED ITEM    Apply 120 Tubes topically daily. Diclofenac/Cyclobenzaprine/lamotrigine/lidocaine/prilocaine (86578) 2%/2%/6%/5%/1.25% cream QTY: 120 GM SIG: (NEURO)  APPLY 1-2 PUMPS (1-2 GMS) TO AFFECTED AREA (S) OR FOCAL POINTS 3 TO 4 TIMES DAILY. RUB IN FOR 2 MINUTES TO ACHIEVE MAX PENETRATION. WASH HANDS WELL.   OMEGA-3 FATTY ACIDS (FISH OIL PO)    Take 1 capsule by mouth daily.   OMEPRAZOLE (PRILOSEC) 40 MG CAPSULE    TAKE 1 CAPSULE BY MOUTH DAILY   POLYETHYLENE GLYCOL POWDER (GLYCOLAX/MIRALAX) 17 GM/SCOOP POWDER    1 capful daily   PREGABALIN (LYRICA) 25 MG CAPSULE    Take 25 mg by mouth 2 (two) times daily.   SENNA-DOCUSATE (SENOKOT S) 8.6-50 MG TABLET    Take 2 tablets by mouth daily.   SPIRONOLACTONE (ALDACTONE) 25 MG TABLET    TAKE 1 TABLET BY MOUTH IN THE  MORNING   TELMISARTAN (MICARDIS) 20 MG TABLET    Take 1 tablet (20 mg total) by mouth daily.   TROSPIUM CHLORIDE 60 MG CP24    Take 1 capsule (60 mg total) by mouth daily.   TURMERIC (QC TUMERIC COMPLEX PO)    Take 1,000 mg by mouth daily.   VITAMIN E 400 UNITS TABS    Take 400 Units by mouth daily.   Modified Medications   No medications on file  Discontinued Medications   ACETAMINOPHEN (TYLENOL) 500 MG TABLET    Take 500 mg by mouth 2 (two) times daily.  in the morning and afternoon   FLUTICASONE (FLONASE) 50 MCG/ACT NASAL SPRAY    Place 2 sprays into both nostrils daily.   LEVOTHYROXINE (SYNTHROID) 100 MCG TABLET    Take 100 mcg by mouth daily.   NALTREXONE (DEPADE) 50 MG TABLET    Take 0.5 tablets (25 mg total) by mouth daily. Takes 5 mg nightly for pain- sent to [facility]-   PREGABALIN (LYRICA) 25 MG CAPSULE    Take 2 capsules (50 mg  total) by mouth at bedtime. For nerve pain/back pain    Physical Exam:  Vitals:   11/12/22 1100  BP: 118/70  Pulse: 72  Temp: (!) 97.1 F (36.2 C)  TempSrc: Temporal  SpO2: 96%  Weight: 117 lb (53.1 kg)  Height: 4\' 10"  (1.473 m)   Body mass index is 24.45 kg/m. Wt Readings from Last 3 Encounters:  11/12/22 117 lb (53.1 kg)  10/31/22 112 lb 3.2 oz (50.9 kg)  08/01/22 110 lb (49.9 kg)    Physical Exam Constitutional:      General:  She is not in acute distress.    Appearance: She is well-developed. She is not diaphoretic.  HENT:     Head: Normocephalic and atraumatic.     Mouth/Throat:     Pharynx: No oropharyngeal exudate.  Eyes:     Conjunctiva/sclera: Conjunctivae normal.     Pupils: Pupils are equal, round, and reactive to light.  Cardiovascular:     Rate and Rhythm: Normal rate and regular rhythm.     Heart sounds: Normal heart sounds.  Pulmonary:     Effort: Pulmonary effort is normal.     Breath sounds: Normal breath sounds.  Abdominal:     General: Bowel sounds are normal.     Palpations: Abdomen is soft.  Musculoskeletal:     Cervical back: Normal range of motion and neck supple.     Right lower leg: No edema.     Left lower leg: No edema.  Skin:    General: Skin is warm and dry.  Neurological:     Mental Status: She is alert and oriented to person, place, and time.     Motor: Weakness present.     Gait: Gait abnormal (slow).  Psychiatric:        Mood and Affect: Mood normal.   ***  Labs reviewed: Basic Metabolic Panel: Recent Labs    12/04/21 1016 12/22/21 0912 06/22/22 0918 07/09/22 1143 08/13/22 1018  NA 134* 135 133*  --   --   K 4.7 4.7 4.6  --   --   CL 99 101 102  --   --   CO2 27 28 26   --   --   GLUCOSE 57* 73 123*  --   --   BUN 19 19 18   --   --   CREATININE 0.77 0.73 0.76  --   --   CALCIUM 10.3 10.1 10.1  --   --   TSH  --   --   --  0.14* 0.80   Liver Function Tests: Recent Labs    12/04/21 1016 12/22/21 0912 06/22/22 0918  AST 22 29 26   ALT 20 23 16   ALKPHOS  --  69 89  BILITOT 0.3 0.4 0.4  PROT 6.9 6.5 7.8  ALBUMIN  --  3.6 3.6   Recent Labs    12/04/21 1016  LIPASE 14  AMYLASE 34   No results for input(s): "AMMONIA" in the last 8760 hours. CBC: Recent Labs    12/22/21 0912 01/17/22 1116 06/22/22 0918  WBC 5.9 7.1 6.7  NEUTROABS 3.7 5,098 4.2  HGB 11.8* 12.3 12.2  HCT 35.2* 38.0 36.9  MCV 88.7 90.7 86.8  PLT 151 144 150   Lipid  Panel: Recent Labs    07/09/22 1143  CHOL 146  HDL 57  LDLCALC 75  TRIG 67  CHOLHDL 2.6   TSH: Recent Labs    07/09/22 1143 08/13/22 1018  TSH 0.14* 0.80  A1C: No results found for: "HGBA1C"   Assessment/Plan There are no diagnoses linked to this encounter.  No follow-ups on file.: ***   K. Biagio Borg [facility] [phone]   .imm

## 2022-11-21 ENCOUNTER — Other Ambulatory Visit: Payer: Self-pay | Admitting: Obstetrics and Gynecology

## 2022-11-21 DIAGNOSIS — N393 Stress incontinence (female) (male): Secondary | ICD-10-CM

## 2022-11-21 DIAGNOSIS — N3281 Overactive bladder: Secondary | ICD-10-CM

## 2022-11-21 MED ORDER — SULFAMETHOXAZOLE-TRIMETHOPRIM 800-160 MG PO TABS
1.0000 | ORAL_TABLET | Freq: Two times a day (BID) | ORAL | 0 refills | Status: AC
Start: 2022-11-21 — End: 2022-11-22

## 2022-11-21 NOTE — Progress Notes (Unsigned)
Please inform patient that antibiotic (bactrim) was sent to the pharmacy for her procedure tomorrow.

## 2022-11-21 NOTE — Progress Notes (Unsigned)
LM on the VM

## 2022-11-21 NOTE — Progress Notes (Signed)
Pt and daughter is notified.

## 2022-11-22 ENCOUNTER — Ambulatory Visit (INDEPENDENT_AMBULATORY_CARE_PROVIDER_SITE_OTHER): Payer: Medicare Other | Admitting: Obstetrics and Gynecology

## 2022-11-22 ENCOUNTER — Encounter: Payer: Self-pay | Admitting: Obstetrics and Gynecology

## 2022-11-22 VITALS — BP 135/77 | HR 73

## 2022-11-22 DIAGNOSIS — N812 Incomplete uterovaginal prolapse: Secondary | ICD-10-CM | POA: Diagnosis not present

## 2022-11-22 DIAGNOSIS — R350 Frequency of micturition: Secondary | ICD-10-CM

## 2022-11-22 DIAGNOSIS — N8110 Cystocele, unspecified: Secondary | ICD-10-CM

## 2022-11-22 DIAGNOSIS — N393 Stress incontinence (female) (male): Secondary | ICD-10-CM

## 2022-11-22 LAB — POCT URINALYSIS DIPSTICK
Bilirubin, UA: NEGATIVE
Blood, UA: NEGATIVE
Glucose, UA: NEGATIVE
Ketones, UA: NEGATIVE
Leukocytes, UA: NEGATIVE
Nitrite, UA: NEGATIVE
Protein, UA: NEGATIVE
Spec Grav, UA: 1.025 (ref 1.010–1.025)
Urobilinogen, UA: 0.2 E.U./dL
pH, UA: 7 (ref 5.0–8.0)

## 2022-11-22 MED ORDER — LIDOCAINE-EPINEPHRINE 1 %-1:100000 IJ SOLN
8.0000 mL | Freq: Once | INTRAMUSCULAR | Status: AC
Start: 2022-11-22 — End: 2022-11-22
  Administered 2022-11-22: 8 mL

## 2022-11-22 MED ORDER — LIDOCAINE HCL URETHRAL/MUCOSAL 2 % EX GEL
1.0000 | Freq: Once | CUTANEOUS | Status: AC
Start: 2022-11-22 — End: 2022-11-22
  Administered 2022-11-22: 1 via URETHRAL

## 2022-11-22 NOTE — Progress Notes (Signed)
Bulkamid Injection  CC: 89 y.o. y.o. F with stress incontinence who presents for transurethral Bulkamid injection.  Patient signed her consent form.  She started antibiotic prophylaxis today.  Procedure: Time out was performed. The bladder was catheterized and 10 ml of 2% lidocaine jelly placed in the urethra. A urethral block was performed by injecting 4ml of 1% lidocaine with epinephrine at 3 and 6 o'clock adjacent to the urethra.  The needle was primed.  The cystoscope was inserted to the level of the bladder neck.  The needle was inserted 2 cm and the scope was pulled back into the urethra 2 cm.  The needle was inserted bevel up at the 5 o'clock position and the Bulkamid was injected to obtain coaptation.  This was repeated at the 2 o'clock,  10 o'clock and 7 o'clock positions.   A total of 2- 1ml syringes were used and good circumferential coaptation was noted.  The patient tolerated the procedure well. She was asked to void after the procedure.  Post-Void Residual (PVR) by Bladder Scan: In order to evaluate bladder emptying, we discussed obtaining a postvoid residual and she agreed to this procedure.  Procedure: The ultrasound unit was placed on the patient's abdomen in the suprapubic region after the patient had voided. A PVR of 250 ml was obtained by bladder scan.  ASSESSMENT: 89 y.o. y.o. s/p transurethral Bulkamid injection for stress incontinence.   PLAN: - Pt unable to void so 46F catheter was inserted and she will follow up tomorrow for repeat voiding trial.  - Patient will follow up in 4 weeks to reassess SUI symptoms. Voiding and post-procedure precautions were given. She will return for heavy bleeding, fevers, dysuria lasting beyond today and incomplete emptying.  All questions were answered.  Marguerita Beards, MD

## 2022-11-22 NOTE — Progress Notes (Signed)
[name] is a 89 y.o. female couldnot void after urethreal bulking. Pt's PVR was the first time she voided and the second attempt her PVR was .  Betadine uses to clean the urethra 12 fr cath was placed with no complications. Cath care instruction were explained. Pt was scheduled for a nurse visit on 11/23/22 @ 11:30am  Bladder Drained:  Pt and daughter verbalized understanding

## 2022-11-22 NOTE — Patient Instructions (Signed)

## 2022-11-22 NOTE — Progress Notes (Signed)
[facility]   Subjective:     Chief Complaint:  Chief Complaint  Patient presents with   Procedure    [name] is a 89 y.o. female here for a urethral bulking   History of Present Illness: [name] is a 89 y.o. female with stage II pelvic organ prolapse who presents for a pessary check. She is using a size #3 ring with support pessary. The pessary has been working well and she has no complaints.  She denies vaginal bleeding. She is also here today for urethral bulking injection (see separate note).   Past Medical History: Patient  has a past medical history of Arthritis, Cancer (HCC), Cataracts, bilateral, CHF (congestive heart failure) (HCC), Complication of anesthesia, Depression, Dyslipidemia, Fainted (04/21/06), GERD (gastroesophageal reflux disease), Headache(784.0), Hearing loss, HLD (hyperlipidemia), Hypertension, Hypothyroidism, ICD (implantable cardiac defibrillator) in place, ICD (implantable cardiac defibrillator), biventricular, in situ, LBBB (left bundle branch block), Memory loss, Nonischemic cardiomyopathy (HCC), Normal coronary arteries, Pacemaker, Syncope, Systolic CHF (HCC), Vertigo, and Wears glasses.   Past Surgical History: She  has a past surgical history that includes Total knee arthroplasty (05/17/01); Lumbar fusion (2006); Mastectomy, partial (2008); Breast surgery (2000); Joint replacement (06/14/01); Pacemaker insertion (04/23/06); Back surgery; Cardiac catheterization; Mass excision (11/08/2011); implantable cardioverter defibrillator generator change (N/A, 12/18/2012); Cataract extraction; Eye surgery; Total knee arthroplasty (Left, 11/29/2014); Breast lumpectomy (Left, 2008); Colonoscopy; and Mastectomy w/ sentinel node biopsy (Left, 06/04/2019).   Medications: She has a current medication list which includes the following prescription(s): acetaminophen, albuterol, ascorbic acid, bupropion, carvedilol, vitamin d3, diclofenac sodium,  duloxetine, estradiol, famotidine, furosemide, levothyroxine, lidocaine, lubiprostone, magnesium, metaxalone, pumpkin seed oil, naltrexone hcl (pain), NONFORMULARY OR COMPOUNDED ITEM, omega-3 fatty acids, omeprazole, polyethylene glycol powder, pregabalin, senna-docusate, spironolactone, sulfamethoxazole-trimethoprim, telmisartan, trospium chloride, turmeric, and vitamin e, and the following Facility-Administered Medications: lidocaine and lidocaine-epinephrine.   Allergies: Patient is allergic to iodine, shellfish allergy, memantine, aspirin, and codeine.   Social History: Patient  reports that she has never smoked. She has never been exposed to tobacco smoke. She has never used smokeless tobacco. She reports that she does not drink alcohol and does not use drugs.      Objective:    Physical Exam: BP 135/77   Pulse 73   LMP  (LMP Unknown)  Gen: No apparent distress, A&O x 3. Detailed Urogynecologic Evaluation:  Pelvic Exam: Normal external female genitalia; Bartholin's and Skene's glands normal in appearance; urethral meatus normal in appearance, no urethral masses or discharge. The pessary was noted to be in place. It was removed and cleaned. Speculum exam revealed no lesions in the vagina. The pessary was replaced. It was comfortable to the patient and fit well.       Assessment/Plan:    Assessment: Ms. Siek is a 89 y.o. with stage II pelvic organ prolapse here for a pessary check. She is doing well.  Plan: She will keep the pessary in place until next visit.  - Plan for cleanings q3-4 months  Stacey Beards, MD  Time Spent:  Time spent: I spent 15 minutes dedicated to the care of this patient on the date of this encounter to include pre-visit review of records, face-to-face time with the patient and post visit documentation.

## 2022-11-23 ENCOUNTER — Ambulatory Visit (INDEPENDENT_AMBULATORY_CARE_PROVIDER_SITE_OTHER): Payer: Medicare Other

## 2022-11-23 DIAGNOSIS — R39198 Other difficulties with micturition: Secondary | ICD-10-CM

## 2022-11-23 NOTE — Progress Notes (Unsigned)
Attempt made to do a voiding trial. When I put in 100cc pt was leaking around the catheter. Dr Florian Buff was notified. We attempted to do the voiding trial using gravity and the pt continued to leak around the catheter after 30 cc was inserted.  Pt drank 8oz of water and was able to urinate 100cc and PVR was 49

## 2022-11-24 ENCOUNTER — Other Ambulatory Visit: Payer: Self-pay | Admitting: Nurse Practitioner

## 2022-11-26 ENCOUNTER — Ambulatory Visit: Payer: Medicare Other

## 2022-11-26 DIAGNOSIS — M810 Age-related osteoporosis without current pathological fracture: Secondary | ICD-10-CM

## 2022-11-26 MED ORDER — DENOSUMAB 60 MG/ML ~~LOC~~ SOSY
60.0000 mg | PREFILLED_SYRINGE | Freq: Once | SUBCUTANEOUS | Status: AC
Start: 2022-11-26 — End: 2022-11-26
  Administered 2022-11-26: 60 mg via SUBCUTANEOUS

## 2022-12-11 ENCOUNTER — Encounter: Payer: Self-pay | Admitting: Internal Medicine

## 2022-12-11 ENCOUNTER — Ambulatory Visit: Payer: Medicare Other | Attending: Internal Medicine | Admitting: Internal Medicine

## 2022-12-11 ENCOUNTER — Telehealth: Payer: Self-pay

## 2022-12-11 VITALS — BP 126/74 | HR 70 | Ht <= 58 in | Wt 112.0 lb

## 2022-12-11 DIAGNOSIS — I428 Other cardiomyopathies: Secondary | ICD-10-CM | POA: Diagnosis not present

## 2022-12-11 DIAGNOSIS — Z01812 Encounter for preprocedural laboratory examination: Secondary | ICD-10-CM

## 2022-12-11 LAB — CUP PACEART INCLINIC DEVICE CHECK
Date Time Interrogation Session: 20240903142543
HighPow Impedance: 49 Ohm
Implantable Lead Connection Status: 753985
Implantable Lead Connection Status: 753985
Implantable Lead Connection Status: 753985
Implantable Lead Implant Date: 20080116
Implantable Lead Implant Date: 20080116
Implantable Lead Implant Date: 20080116
Implantable Lead Location: 753859
Implantable Lead Location: 753860
Implantable Lead Location: 753860
Implantable Lead Model: 157
Implantable Lead Model: 4469
Implantable Lead Model: 4555
Implantable Lead Serial Number: 136532
Implantable Lead Serial Number: 161542
Implantable Lead Serial Number: 473495
Implantable Pulse Generator Implant Date: 20140911
Lead Channel Impedance Value: 391 Ohm
Lead Channel Impedance Value: 473 Ohm
Lead Channel Impedance Value: 801 Ohm
Lead Channel Pacing Threshold Amplitude: 0.7 V
Lead Channel Pacing Threshold Amplitude: 0.7 V
Lead Channel Pacing Threshold Amplitude: 1.1 V
Lead Channel Pacing Threshold Pulse Width: 0.4 ms
Lead Channel Pacing Threshold Pulse Width: 0.4 ms
Lead Channel Pacing Threshold Pulse Width: 0.8 ms
Lead Channel Sensing Intrinsic Amplitude: 13.4 mV
Lead Channel Sensing Intrinsic Amplitude: 25 mV
Lead Channel Sensing Intrinsic Amplitude: 5.9 mV
Lead Channel Setting Pacing Amplitude: 2 V
Lead Channel Setting Pacing Amplitude: 2 V
Lead Channel Setting Pacing Amplitude: 2.2 V
Lead Channel Setting Pacing Pulse Width: 0.4 ms
Lead Channel Setting Pacing Pulse Width: 0.8 ms
Lead Channel Setting Sensing Sensitivity: 0.6 mV
Lead Channel Setting Sensing Sensitivity: 1 mV
Pulse Gen Serial Number: 111765

## 2022-12-11 NOTE — Telephone Encounter (Signed)
Pt seen in office today 12/11/22.

## 2022-12-11 NOTE — Patient Instructions (Addendum)
Medication Instructions:  Your physician recommends that you continue on your current medications as directed. Please refer to the Current Medication list given to you today.  *If you need a refill on your cardiac medications before your next appointment, please call your pharmacy*  Lab Work: Jan 23 2023 anytime between 7:30 am and 4:30 pm  If you have labs (blood work) drawn today and your tests are completely normal, you will receive your results only by: MyChart Message (if you have MyChart) OR A paper copy in the mail If you have any lab test that is abnormal or we need to change your treatment, we will call you to review the results.  Testing/Procedures: BiV ICD Change - instructions in letter  Follow-Up: At [facility], you and your health needs are our priority.  As part of our continuing mission to provide you with exceptional heart care, we have created designated Provider Care Teams.  These Care Teams include your primary Cardiologist (physician) and Advanced Practice Providers (APPs -  Physician Assistants and Nurse Practitioners) who all work together to provide you with the care you need, when you need it.   Your next appointment:   To be scheduled  Important Information About Sugar

## 2022-12-11 NOTE — Telephone Encounter (Signed)
ICD reached ERI 12/08/22.  Attempted to contact patient to advise. LMTCB.  Pt has apt with Dr. Ladona Ridgel 03/05/23, will need sooner apt to discuss gen change.   Pt advised ICD @ [facility] and scheduler will contact her for apt info. Voiced understanding.

## 2022-12-11 NOTE — Progress Notes (Signed)
HPI Ms. Brundige returns today for followup. She is a pleasant 89 yo woman with a h/o chronic systolic heart failure, LBBB, s/p biv ICD insertion. She notes some weight loss and dyspnea though she has become more sedentary. She does ok on flat ground. No ICD shocks. She has been diagnosed with breast CA and undergone mastectomy. She has done well so far after breast surgery. She is still a little sore. she gets a little dizzy when she gets up in the morning. her weight is down about 10 lbs in 18 months.    Allergies  Allergen Reactions   Iodine Shortness Of Breath    Iodine contrast, CHF , SOB   Shellfish Allergy Shortness Of Breath   Memantine     Malaise, fogginess/ couldn't think   Aspirin Nausea And Vomiting   Codeine Nausea And Vomiting     Current Outpatient Medications  Medication Sig Dispense Refill   acetaminophen (TYLENOL) 650 MG CR tablet Take 650 mg by mouth at bedtime.     albuterol (VENTOLIN HFA) 108 (90 Base) MCG/ACT inhaler Inhale 2 puffs into the lungs every 6 (six) hours as needed for wheezing or shortness of breath. 8 g 11   ascorbic acid (VITAMIN C) 500 MG tablet Take 500 mg by mouth daily.     buPROPion (WELLBUTRIN XL) 150 MG 24 hr tablet Take 1 tablet (150 mg total) by mouth daily.     carvedilol (COREG) 3.125 MG tablet TAKE 1 TABLET BY MOUTH TWICE  DAILY 180 tablet 3   Cholecalciferol (VITAMIN D3) 50 MCG (2000 UT) TABS Take 2,000 Units by mouth daily.      diclofenac Sodium (PENNSAID) 2 % SOLN APPLY 2 PUMPS (40GM) TO AFFECTED AREA UP TO 2 TIMES PER DAY AS DIRECTED. 224 g 0   DULoxetine (CYMBALTA) 60 MG capsule Take 60 mg by mouth daily.     estradiol (ESTRACE) 0.1 MG/GM vaginal cream Place 0.5 g vaginally 2 (two) times a week. Place 0.5g nightly for two weeks then twice a week after 30 g 11   famotidine (PEPCID) 10 MG tablet Take 10 mg by mouth as needed for heartburn or indigestion.     furosemide (LASIX) 20 MG tablet Take 1 tablet (20 mg total) by mouth  daily as needed. 90 tablet 3   levothyroxine (SYNTHROID) 88 MCG tablet Take 1 tablet (88 mcg total) by mouth daily. 90 tablet 3   Lidocaine 4 % PTCH Apply 1 patch topically every 12 (twelve) hours. Apply to back     lubiprostone (AMITIZA) 8 MCG capsule TAKE ONE CAPSULE BY MOUTH TWICE DAILY WITH A MEAL 180 capsule 1   Magnesium 250 MG TABS Take 250 mg by mouth daily.     metaxalone (SKELAXIN) 800 MG tablet Take 1 tablet (800 mg total) by mouth 3 (three) times daily as needed for muscle spasms. 60 tablet 5   Misc Natural Products (PUMPKIN SEED OIL) CAPS Take 1 capsule by mouth daily. 180 capsule 0   NALTREXONE HCL, PAIN, PO Take 4 mg by mouth at bedtime.     NONFORMULARY OR COMPOUNDED ITEM Apply 120 Tubes topically daily. Diclofenac/Cyclobenzaprine/lamotrigine/lidocaine/prilocaine (44034) 2%/2%/6%/5%/1.25% cream QTY: 120 GM SIG: (NEURO) APPLY 1-2 PUMPS (1-2 GMS) TO AFFECTED AREA (S) OR FOCAL POINTS 3 TO 4 TIMES DAILY. RUB IN FOR 2 MINUTES TO ACHIEVE MAX PENETRATION. WASH HANDS WELL. 1 each 2   Omega-3 Fatty Acids (FISH OIL PO) Take 1 capsule by mouth daily.  omeprazole (PRILOSEC) 40 MG capsule TAKE 1 CAPSULE BY MOUTH DAILY 90 capsule 3   polyethylene glycol powder (GLYCOLAX/MIRALAX) 17 GM/SCOOP powder 1 capful daily     pregabalin (LYRICA) 25 MG capsule Take 25 mg by mouth 2 (two) times daily.     senna-docusate (SENOKOT S) 8.6-50 MG tablet Take 2 tablets by mouth daily. 60 tablet 5   spironolactone (ALDACTONE) 25 MG tablet TAKE 1 TABLET BY MOUTH IN THE  MORNING 90 tablet 3   telmisartan (MICARDIS) 20 MG tablet Take 1 tablet (20 mg total) by mouth daily. 90 tablet 1   Trospium Chloride 60 MG CP24 Take 1 capsule (60 mg total) by mouth daily. 30 capsule 5   Turmeric (QC TUMERIC COMPLEX PO) Take 1,000 mg by mouth daily.     Vitamin E 400 units TABS Take 400 Units by mouth daily.      No current facility-administered medications for this visit.     Past Medical History:  Diagnosis Date    Arthritis    Cancer (HCC)    left breast cancer    Cataracts, bilateral    removed by surgery   CHF (congestive heart failure) (HCC)    PACEMAKER & DEFIB   Complication of anesthesia    hypotensive after back surgery in 2006   Depression    Dyslipidemia    Fainted 04/21/06   AT [facility]   GERD (gastroesophageal reflux disease)    Headache(784.0)    Hearing loss    bilateral hearing aids   HLD (hyperlipidemia)    diet controlled    Hypertension    Hypothyroidism    ICD (implantable cardiac defibrillator) in place    pt has pacer/icd   ICD (implantable cardiac defibrillator), biventricular, in situ    LBBB (left bundle branch block)    Memory loss    Nonischemic cardiomyopathy (HCC)    Normal coronary arteries    s/p cardiac cath 2007   Pacemaker    ICD [employer]   Syncope    Systolic CHF [facility])    Vertigo    Wears glasses     ROS:   All systems reviewed and negative except as noted in the HPI.   Past Surgical History:  Procedure Laterality Date   BACK SURGERY     lumbar fusion    BREAST LUMPECTOMY Left 2008   BREAST SURGERY  2000   LUMP REMOVAL. STAGE 1 CANCER   CARDIAC CATHETERIZATION     CATARACT EXTRACTION     COLONOSCOPY     EYE SURGERY     IMPLANTABLE CARDIOVERTER DEFIBRILLATOR GENERATOR CHANGE N/A 12/18/2012   Procedure: IMPLANTABLE CARDIOVERTER DEFIBRILLATOR GENERATOR CHANGE;  Surgeon: Marinus Maw, MD;  Location: [facility] CATH LAB;  Service: Cardiovascular;  Laterality: N/A;   JOINT REPLACEMENT  06/14/01   right   LUMBAR FUSION  2006   MASS EXCISION  11/08/2011   Procedure: EXCISION MASS;  Surgeon: Almond Lint, MD;  Location: [facility] ORS;  Service: General;  Laterality: Left;  Excision Left Thigh Mass   MASTECTOMY W/ SENTINEL NODE BIOPSY Left 06/04/2019   Procedure: LEFT MASTECTOMY WITH SENTINEL LYMPH NODE BIOPSY;  Surgeon: Almond Lint, MD;  Location: [facility] OR;  Service: General;  Laterality: Left;   MASTECTOMY, PARTIAL  2008   GOT PACEMAKER AND DEFIB AT  THAT TIME   PACEMAKER INSERTION  04/23/06   TOTAL KNEE ARTHROPLASTY  05/17/01   RIGHT KNEE   TOTAL KNEE ARTHROPLASTY Left 11/29/2014   Procedure: TOTAL LEFT KNEE  ARTHROPLASTY;  Surgeon: Durene Romans, MD;  Location: [facility] ORS;  Service: Orthopedics;  Laterality: Left;     Family History  Problem Relation Age of Onset   Hypertension Mother    Arthritis Mother    Hypertension Father    Hypertension Brother    Hypertension Brother      Social History   Socioeconomic History   Marital status: Married    Spouse name: Not on file   Number of children: 1   Years of education: Masters   Highest education level: Master's degree (e.g., MA, MS, MEng, MEd, MSW, MBA)  Occupational History   Occupation: Retired  Tobacco Use   Smoking status: Never    Passive exposure: Never   Smokeless tobacco: Never  Vaping Use   Vaping status: Never Used  Substance and Sexual Activity   Alcohol use: No   Drug use: No   Sexual activity: Not Currently    Birth control/protection: Post-menopausal  Other Topics Concern   Not on file  Social History Narrative   Lives at home with husband.   Right-handed.      As of 07/28/2014   Diet: No special diet   Caffeine: yes, Chocolate, tea and sodas    Married: YES, 1970   House: Yes, 2 stories, 2-3 persons live in home   Pets: No   Current/Past profession: Nurse, mental health, Publishing rights manager    Exercise: Yes 2-3 x weekly   Living Will: Yes   DNR: No   POA/HPOA: No      Social Determinants of Health   Financial Resource Strain: Low Risk  (07/05/2022)   Overall Financial Resource Strain (CARDIA)    Difficulty of Paying Living Expenses: Not very hard  Food Insecurity: No Food Insecurity (07/05/2022)   Hunger Vital Sign    Worried About Running Out of Food in the Last Year: Never true    Ran Out of Food in the Last Year: Never true  Transportation Needs: No Transportation Needs (07/05/2022)   PRAPARE - Administrator, Civil Service (Medical): No     Lack of Transportation (Non-Medical): No  Physical Activity: Unknown (07/05/2022)   Exercise Vital Sign    Days of Exercise per Week: Patient declined    Minutes of Exercise per Session: Not on file  Stress: Patient Declined (07/05/2022)   [employer] of Occupational Health - Occupational Stress Questionnaire    Feeling of Stress : Patient declined  Social Connections: Unknown (07/05/2022)   Social Connection and Isolation Panel [NHANES]    Frequency of Communication with Friends and Family: Twice a week    Frequency of Social Gatherings with Friends and Family: Patient declined    Attends Religious Services: More than 4 times per year    Active Member of [employer] or Organizations: Yes    Attends Banker Meetings: Not on file    Marital Status: Widowed  Intimate Partner Violence: Not At Risk (03/27/2017)   Humiliation, Afraid, Rape, and Kick questionnaire    Fear of Current or Ex-Partner: No    Emotionally Abused: No    Physically Abused: No    Sexually Abused: No     BP 126/74   Pulse 70   Ht 4\' 10"  (1.473 m)   Wt 112 lb (50.8 kg)   LMP  (LMP Unknown)   SpO2 96%   BMI 23.41 kg/m   Physical Exam:  Well appearing NAD HEENT: Unremarkable Neck:  No JVD, no thyromegally Lymphatics:  No adenopathy  Back:  No CVA tenderness Lungs:  Clear HEART:  Regular rate rhythm, no murmurs, no rubs, no clicks Abd:  soft, positive bowel sounds, no organomegally, no rebound, no guarding Ext:  2 plus pulses, no edema, no cyanosis, no clubbing Skin:  No rashes no nodules Neuro:  CN II through XII intact, motor grossly intact  EKG  DEVICE  Normal device function.  See PaceArt for details.   Assess/Plan:   Chronic systolic heart failure - her symptoms remain class 2. She will continue her current meds. 2. ICD - her boston Sci DDD biv ICD is working normally. She has reached ERI. She will undergo gen change out. 3. HTN - her bp is controlled. No change in meds. 4.  Weight loss - her weight is unchanged since her last visit.  Sharlot Gowda Ayumi Wangerin,MD

## 2022-12-21 ENCOUNTER — Encounter: Payer: Self-pay | Admitting: Obstetrics and Gynecology

## 2022-12-21 ENCOUNTER — Ambulatory Visit (INDEPENDENT_AMBULATORY_CARE_PROVIDER_SITE_OTHER): Payer: Medicare Other | Admitting: Obstetrics and Gynecology

## 2022-12-21 VITALS — BP 143/68 | HR 70

## 2022-12-21 DIAGNOSIS — N393 Stress incontinence (female) (male): Secondary | ICD-10-CM

## 2022-12-21 DIAGNOSIS — N3281 Overactive bladder: Secondary | ICD-10-CM

## 2022-12-21 DIAGNOSIS — N8110 Cystocele, unspecified: Secondary | ICD-10-CM | POA: Diagnosis not present

## 2022-12-21 NOTE — Progress Notes (Signed)
[facility] Return Visit  SUBJECTIVE  History of Present Illness: [name] is a 89 y.o. female seen in follow-up for mixed incontinence and prolapse. Currently has a ring with support pessary (last cleaned 8/15).  Had urethral bulking for SUI symptoms on 8/15.  She noticed improvement for a week and was really happy but then leakage returned and she was back to baseline. Does feel like she is emptying mostly, but sometimes feels a pressure.   Has been on trospium and pumpkin seed oil but she is not sure if it is helping her urgency. The majority of her symptoms are with standing on the way to the bathroom.     She has tried Programme researcher, broadcasting/film/video, Medical illustrator for OAB. Has a pacemaker and not eligible for PTNS. Does not want another procedure.   Past Medical History: Patient  has a past medical history of Arthritis, Cancer (HCC), Cataracts, bilateral, CHF (congestive heart failure) (HCC), Complication of anesthesia, Depression, Dyslipidemia, Fainted (04/21/06), GERD (gastroesophageal reflux disease), Headache(784.0), Hearing loss, HLD (hyperlipidemia), Hypertension, Hypothyroidism, ICD (implantable cardiac defibrillator) in place, ICD (implantable cardiac defibrillator), biventricular, in situ, LBBB (left bundle branch block), Memory loss, Nonischemic cardiomyopathy (HCC), Normal coronary arteries, Pacemaker, Syncope, Systolic CHF (HCC), Vertigo, and Wears glasses.   Past Surgical History: She  has a past surgical history that includes Total knee arthroplasty (05/17/01); Lumbar fusion (2006); Mastectomy, partial (2008); Breast surgery (2000); Joint replacement (06/14/01); Pacemaker insertion (04/23/06); Back surgery; Cardiac catheterization; Mass excision (11/08/2011); implantable cardioverter defibrillator generator change (N/A, 12/18/2012); Cataract extraction; Eye surgery; Total knee arthroplasty (Left, 11/29/2014); Breast lumpectomy (Left, 2008); Colonoscopy; and Mastectomy w/ sentinel  node biopsy (Left, 06/04/2019).   Medications: She has a current medication list which includes the following prescription(s): acetaminophen, albuterol, ascorbic acid, bupropion, carvedilol, vitamin d3, diclofenac sodium, duloxetine, estradiol, famotidine, furosemide, levothyroxine, lidocaine, lubiprostone, magnesium, metaxalone, pumpkin seed oil, naltrexone hcl (pain), NONFORMULARY OR COMPOUNDED ITEM, omega-3 fatty acids, omeprazole, polyethylene glycol powder, pregabalin, senna-docusate, spironolactone, telmisartan, trospium chloride, turmeric, and vitamin e.   Allergies: Patient is allergic to iodine, shellfish allergy, memantine, aspirin, and codeine.   Social History: Patient  reports that she has never smoked. She has never been exposed to tobacco smoke. She has never used smokeless tobacco. She reports that she does not drink alcohol and does not use drugs.    OBJECTIVE     Physical Exam: Vitals:   12/21/22 1349  BP: (!) 143/68  Pulse: 70   Gen: No apparent distress, A&O x 3.  Detailed Urogynecologic Evaluation:  Deferred. Prior exam showed:  POP-Q (05/23/22)   1.5                                            Aa   1.5                                           Ba   -4                                              C    4  Gh   3                                            Pb   7                                            tvl    -2                                            Ap   -2                                            Bp   -4.5                                              D     PVR with bladder scan: 30ml  ASSESSMENT AND PLAN    Ms. Haagensen is a 89 y.o. with:  1. SUI (stress urinary incontinence, female)   2. Overactive bladder   3. Prolapse of anterior vaginal wall    - Discussed option of Botox for OAB. She does not want to try this due to the risk of urinary retention. She will continue the trospium and will  consider a trial off the medication for a few days to see if she notices a difference.  - She would like to try the urethral bulking again in the office. We discussed that it is unclear if this would be beneficial since she only saw improvement for a short time. Not interested in other surgical procedures.  - return for pessary cleaning 3 months and will plan for urethral bulking at the same time.     Marguerita Beards, MD

## 2022-12-24 ENCOUNTER — Ambulatory Visit: Payer: Medicare Other | Admitting: Urology

## 2023-01-14 ENCOUNTER — Other Ambulatory Visit: Payer: Self-pay | Admitting: Obstetrics and Gynecology

## 2023-01-15 ENCOUNTER — Other Ambulatory Visit: Payer: Self-pay | Admitting: *Deleted

## 2023-01-15 DIAGNOSIS — C50912 Malignant neoplasm of unspecified site of left female breast: Secondary | ICD-10-CM

## 2023-01-16 ENCOUNTER — Inpatient Hospital Stay: Payer: Medicare Other | Attending: Hematology

## 2023-01-16 ENCOUNTER — Inpatient Hospital Stay (HOSPITAL_BASED_OUTPATIENT_CLINIC_OR_DEPARTMENT_OTHER): Payer: Medicare Other | Admitting: Hematology

## 2023-01-16 VITALS — BP 142/78 | HR 71 | Temp 97.7°F | Resp 16 | Wt 116.6 lb

## 2023-01-16 DIAGNOSIS — Z171 Estrogen receptor negative status [ER-]: Secondary | ICD-10-CM | POA: Diagnosis not present

## 2023-01-16 DIAGNOSIS — Z79899 Other long term (current) drug therapy: Secondary | ICD-10-CM | POA: Insufficient documentation

## 2023-01-16 DIAGNOSIS — C50912 Malignant neoplasm of unspecified site of left female breast: Secondary | ICD-10-CM | POA: Diagnosis present

## 2023-01-16 DIAGNOSIS — Z9012 Acquired absence of left breast and nipple: Secondary | ICD-10-CM | POA: Insufficient documentation

## 2023-01-16 DIAGNOSIS — J849 Interstitial pulmonary disease, unspecified: Secondary | ICD-10-CM

## 2023-01-16 LAB — CBC WITH DIFFERENTIAL (CANCER CENTER ONLY)
Abs Immature Granulocytes: 0.03 10*3/uL (ref 0.00–0.07)
Basophils Absolute: 0.1 10*3/uL (ref 0.0–0.1)
Basophils Relative: 1 %
Eosinophils Absolute: 0.1 10*3/uL (ref 0.0–0.5)
Eosinophils Relative: 2 %
HCT: 36.3 % (ref 36.0–46.0)
Hemoglobin: 12 g/dL (ref 12.0–15.0)
Immature Granulocytes: 0 %
Lymphocytes Relative: 26 %
Lymphs Abs: 1.8 10*3/uL (ref 0.7–4.0)
MCH: 29.6 pg (ref 26.0–34.0)
MCHC: 33.1 g/dL (ref 30.0–36.0)
MCV: 89.4 fL (ref 80.0–100.0)
Monocytes Absolute: 0.5 10*3/uL (ref 0.1–1.0)
Monocytes Relative: 7 %
Neutro Abs: 4.3 10*3/uL (ref 1.7–7.7)
Neutrophils Relative %: 64 %
Platelet Count: 154 10*3/uL (ref 150–400)
RBC: 4.06 MIL/uL (ref 3.87–5.11)
RDW: 15.3 % (ref 11.5–15.5)
WBC Count: 6.9 10*3/uL (ref 4.0–10.5)
nRBC: 0 % (ref 0.0–0.2)

## 2023-01-16 LAB — CMP (CANCER CENTER ONLY)
ALT: 13 U/L (ref 0–44)
AST: 19 U/L (ref 15–41)
Albumin: 3.9 g/dL (ref 3.5–5.0)
Alkaline Phosphatase: 63 U/L (ref 38–126)
Anion gap: 3 — ABNORMAL LOW (ref 5–15)
BUN: 17 mg/dL (ref 8–23)
CO2: 31 mmol/L (ref 22–32)
Calcium: 10.1 mg/dL (ref 8.9–10.3)
Chloride: 99 mmol/L (ref 98–111)
Creatinine: 0.82 mg/dL (ref 0.44–1.00)
GFR, Estimated: >60 mL/min (ref >60)
Glucose, Bld: 96 mg/dL (ref 70–99)
Potassium: 4.4 mmol/L (ref 3.5–5.1)
Sodium: 133 mmol/L — ABNORMAL LOW (ref 135–145)
Total Bilirubin: 0.4 mg/dL (ref 0.3–1.2)
Total Protein: 7.4 g/dL (ref 6.5–8.1)

## 2023-01-16 NOTE — Progress Notes (Signed)
HEMATOLOGY/ONCOLOGY CLINIC NOTE  Date of Service: 01/16/23   Patient Care Team: [name], NP as PCP - General (Geriatric Medicine) Marinus Maw, MD as PCP - Electrophysiology (Cardiology) Almond Lint, MD as Consulting Physician (General Surgery) Durene Romans, MD as Consulting Physician (Orthopedic Surgery) Blima Ledger, OD (Optometry) Levert Feinstein, MD as Consulting Physician (Neurology) Marinus Maw, MD as Consulting Physician (Cardiology) Marcellina Millin, MD (Psychiatry)  CHIEF COMPLAINTS/PURPOSE OF CONSULTATION:   Continued evaluation and management of her stage 1B high-grade triple negative breast cancer  HISTORY OF PRESENTING ILLNESS:   [name] is a wonderful 89 y.o. female who has been referred to [place] by Abbey Chatters, NP for evaluation and management of easily bruising. The pt reports that she is doing well overall.   The pt reports that she has seen bruises on her arm, and denies bumping into things or any trauma. The pt notes that her bruises are solely located to her upper extremities. She denies concerns for bleeding in her joints, blood in the stools, blood in the urine, nose bleeds or gum bleeds. She notes she has bruised easily for about 2 years. She also notes that she began taking Zoloft about two years ago, and fish oil 4-5 years ago. She is not on any blood thinners. She denies taking any other new medications in the last couple years. The pt denies any thick bruises at any time. The pt denies excessive bleeding with pervious surgeries and dental extractions. The pt denies heavy periods when she was younger.  She has had two knee replacements and took Tramadol for her pain. She denies needing to use this frequently, and takes Tylenol for mild pains.    The pt takes Vitamin E oil for hot flashes, and notes that her Vitamin E oil successfully resolved her hot flashes. She began taking Vitamin E about 18 months ago.   The pt notes  that she has lost about 20 pounds over two years. The pt reports some constipation and denies difficulty swallowing, and weak appetite. The pt denies any dietary restrictions. She continues annual mammograms. She has a history of left sided breast cancer treated with a lumpectomy and radiation.  She notes that her energy levels have also decreased in the last 6 months, and "feels tired all the time." She endorses feeling well rested after taking naps. She notes that she does feel depressed but that taking Zoloft keeps her "afloat." She notes that she feels "medium" enjoyment in her activities. She sees psychiatry every 6 months.  The pt lives with her husband.   Most recent lab results (02/05/18) of CBC w/diff and CMP is as follows: all values are WNL except for PLT at 117k, BUN at 30, Creatinine at 0.96.  On review of systems, pt reports some stable depression, easily bruising on upper extremities, some weight loss, and denies nose bleeds, gum bleeds, blood in the urine, blood in the stools, abdominal pains, leg swelling, and any other symptoms.   On Family Hx the pt denies bleeding disorders.   INTERVAL HISTORY:   [name] is a wonderful 89 y.o.female   here for continued evaluation and management of her stage I 1B high-grade triple negative breast cancer.   Patient was last seen by me on 07/16/2022 and complained of consistent rib pain and mild breathing issues.  Today, she is accompanied by her daughter. Patient complains of feeling a bump in her left axillary, which she believes has grown  in size and was not present 6 months ago. She reports that she has a mammogram scheduled in December. Patient denies any new lumps bumps on right side.   Her daughter reports that in general, patient has needed to use Albuterol more frequently, and her breathing does improve quickly with usage. She has not needed to use inhalor recently.  She does take 250 MG magnesium and fish oil regularly.  Patient is up to speed with COVID-19, RSV, and shingles vaccines. She is planning to receive the flu shot soon. Her caregiver reports that patient likely received Prevnar 20 previously.   Her eating habits and weight have remained stable. Patient denies any infections related to her prolapse, change in bowel habits, abdominal pain, leg swelling, recent falls, abnormal bruising/bleeding, or new headaches.  She complains of feeling sleepy during the day. Patient generally does not take naps in the afternoon due to feeling guilt with not accomplishing her tasks on time.   Patient recently started taking a half tablet of Skelaxin for leg cramping. Her daughter notes that patient did not stop taking it after the initial 5 days. She is not on any statins at this time. Her daughter reports that patient sometimes experiences brain fogginess.  She reports elevated blood pressures at home. Patient's blood pressure is 142/78 in clinic today.  MEDICAL HISTORY:  Past Medical History:  Diagnosis Date   Arthritis    Cancer (HCC)    left breast cancer    Cataracts, bilateral    removed by surgery   CHF (congestive heart failure) (HCC)    PACEMAKER & DEFIB   Complication of anesthesia    hypotensive after back surgery in 2006   Depression    Dyslipidemia    Fainted 04/21/06   AT [facility]   GERD (gastroesophageal reflux disease)    Headache(784.0)    Hearing loss    bilateral hearing aids   HLD (hyperlipidemia)    diet controlled    Hypertension    Hypothyroidism    ICD (implantable cardiac defibrillator) in place    pt has pacer/icd   ICD (implantable cardiac defibrillator), biventricular, in situ    LBBB (left bundle branch block)    Memory loss    Nonischemic cardiomyopathy (HCC)    Normal coronary arteries    s/p cardiac cath 2007   Pacemaker    ICD [employer]   Syncope    Systolic CHF [facility])    Vertigo    Wears glasses     SURGICAL HISTORY: Past Surgical History:  Procedure  Laterality Date   BACK SURGERY     lumbar fusion    BREAST LUMPECTOMY Left 2008   BREAST SURGERY  2000   LUMP REMOVAL. STAGE 1 CANCER   CARDIAC CATHETERIZATION     CATARACT EXTRACTION     COLONOSCOPY     EYE SURGERY     IMPLANTABLE CARDIOVERTER DEFIBRILLATOR GENERATOR CHANGE N/A 12/18/2012   Procedure: IMPLANTABLE CARDIOVERTER DEFIBRILLATOR GENERATOR CHANGE;  Surgeon: Marinus Maw, MD;  Location: [facility] CATH LAB;  Service: Cardiovascular;  Laterality: N/A;   JOINT REPLACEMENT  06/14/01   right   LUMBAR FUSION  2006   MASS EXCISION  11/08/2011   Procedure: EXCISION MASS;  Surgeon: Almond Lint, MD;  Location: [facility] ORS;  Service: General;  Laterality: Left;  Excision Left Thigh Mass   MASTECTOMY W/ SENTINEL NODE BIOPSY Left 06/04/2019   Procedure: LEFT MASTECTOMY WITH SENTINEL LYMPH NODE BIOPSY;  Surgeon: Almond Lint, MD;  Location: [facility]  OR;  Service: General;  Laterality: Left;   MASTECTOMY, PARTIAL  2008   GOT PACEMAKER AND DEFIB AT THAT TIME   PACEMAKER INSERTION  04/23/06   TOTAL KNEE ARTHROPLASTY  05/17/01   RIGHT KNEE   TOTAL KNEE ARTHROPLASTY Left 11/29/2014   Procedure: TOTAL LEFT KNEE ARTHROPLASTY;  Surgeon: Durene Romans, MD;  Location: [facility] ORS;  Service: Orthopedics;  Laterality: Left;    SOCIAL HISTORY: Social History   Socioeconomic History   Marital status: Married    Spouse name: Not on file   Number of children: 1   Years of education: Masters   Highest education level: Master's degree (e.g., MA, MS, MEng, MEd, MSW, MBA)  Occupational History   Occupation: Retired  Tobacco Use   Smoking status: Never    Passive exposure: Never   Smokeless tobacco: Never  Vaping Use   Vaping status: Never Used  Substance and Sexual Activity   Alcohol use: No   Drug use: No   Sexual activity: Not Currently    Birth control/protection: Post-menopausal  Other Topics Concern   Not on file  Social History Narrative   Lives at home with husband.   Right-handed.      As of 07/28/2014    Diet: No special diet   Caffeine: yes, Chocolate, tea and sodas    Married: YES, 1970   House: Yes, 2 stories, 2-3 persons live in home   Pets: No   Current/Past profession: Nurse, mental health, Publishing rights manager    Exercise: Yes 2-3 x weekly   Living Will: Yes   DNR: No   POA/HPOA: No      Social Determinants of Health   Financial Resource Strain: Low Risk  (07/05/2022)   Overall Financial Resource Strain (CARDIA)    Difficulty of Paying Living Expenses: Not very hard  Food Insecurity: No Food Insecurity (07/05/2022)   Hunger Vital Sign    Worried About Running Out of Food in the Last Year: Never true    Ran Out of Food in the Last Year: Never true  Transportation Needs: No Transportation Needs (07/05/2022)   PRAPARE - Administrator, Civil Service (Medical): No    Lack of Transportation (Non-Medical): No  Physical Activity: Unknown (07/05/2022)   Exercise Vital Sign    Days of Exercise per Week: Patient declined    Minutes of Exercise per Session: Not on file  Stress: Patient Declined (07/05/2022)   [employer] of Occupational Health - Occupational Stress Questionnaire    Feeling of Stress : Patient declined  Social Connections: Unknown (07/05/2022)   Social Connection and Isolation Panel [NHANES]    Frequency of Communication with Friends and Family: Twice a week    Frequency of Social Gatherings with Friends and Family: Patient declined    Attends Religious Services: More than 4 times per year    Active Member of [employer] or Organizations: Yes    Attends Banker Meetings: Not on file    Marital Status: Widowed  Intimate Partner Violence: Not At Risk (03/27/2017)   Humiliation, Afraid, Rape, and Kick questionnaire    Fear of Current or Ex-Partner: No    Emotionally Abused: No    Physically Abused: No    Sexually Abused: No    FAMILY HISTORY: Family History  Problem Relation Age of Onset   Hypertension Mother    Arthritis Mother    Hypertension  Father    Hypertension Brother    Hypertension Brother     ALLERGIES:  is allergic to iodine, shellfish allergy, memantine, aspirin, and codeine.  MEDICATIONS:  Current Outpatient Medications  Medication Sig Dispense Refill   acetaminophen (TYLENOL) 650 MG CR tablet Take 650 mg by mouth at bedtime.     albuterol (VENTOLIN HFA) 108 (90 Base) MCG/ACT inhaler Inhale 2 puffs into the lungs every 6 (six) hours as needed for wheezing or shortness of breath. 8 g 11   ascorbic acid (VITAMIN C) 500 MG tablet Take 500 mg by mouth daily.     buPROPion (WELLBUTRIN XL) 150 MG 24 hr tablet Take 1 tablet (150 mg total) by mouth daily.     carvedilol (COREG) 3.125 MG tablet TAKE 1 TABLET BY MOUTH TWICE  DAILY 180 tablet 3   Cholecalciferol (VITAMIN D3) 50 MCG (2000 UT) TABS Take 2,000 Units by mouth daily.      diclofenac Sodium (PENNSAID) 2 % SOLN APPLY 2 PUMPS (40GM) TO AFFECTED AREA UP TO 2 TIMES PER DAY AS DIRECTED. 224 g 0   DULoxetine (CYMBALTA) 60 MG capsule Take 60 mg by mouth daily.     estradiol (ESTRACE) 0.1 MG/GM vaginal cream Place 0.5 g vaginally 2 (two) times a week. Place 0.5g nightly for two weeks then twice a week after 30 g 11   famotidine (PEPCID) 10 MG tablet Take 10 mg by mouth as needed for heartburn or indigestion.     furosemide (LASIX) 20 MG tablet Take 1 tablet (20 mg total) by mouth daily as needed. 90 tablet 3   levothyroxine (SYNTHROID) 88 MCG tablet Take 1 tablet (88 mcg total) by mouth daily. 90 tablet 3   Lidocaine 4 % PTCH Apply 1 patch topically every 12 (twelve) hours. Apply to back     lubiprostone (AMITIZA) 8 MCG capsule TAKE ONE CAPSULE BY MOUTH TWICE DAILY WITH A MEAL 180 capsule 1   Magnesium 250 MG TABS Take 250 mg by mouth daily.     metaxalone (SKELAXIN) 800 MG tablet Take 1 tablet (800 mg total) by mouth 3 (three) times daily as needed for muscle spasms. 60 tablet 5   Misc Natural Products (PUMPKIN SEED OIL) CAPS Take 1 capsule by mouth daily. 180 capsule 0    NALTREXONE HCL, PAIN, PO Take 4 mg by mouth at bedtime.     NONFORMULARY OR COMPOUNDED ITEM Apply 120 Tubes topically daily. Diclofenac/Cyclobenzaprine/lamotrigine/lidocaine/prilocaine (16109) 2%/2%/6%/5%/1.25% cream QTY: 120 GM SIG: (NEURO) APPLY 1-2 PUMPS (1-2 GMS) TO AFFECTED AREA (S) OR FOCAL POINTS 3 TO 4 TIMES DAILY. RUB IN FOR 2 MINUTES TO ACHIEVE MAX PENETRATION. WASH HANDS WELL. 1 each 2   Omega-3 Fatty Acids (FISH OIL PO) Take 1 capsule by mouth daily.     omeprazole (PRILOSEC) 40 MG capsule TAKE 1 CAPSULE BY MOUTH DAILY 90 capsule 3   polyethylene glycol powder (GLYCOLAX/MIRALAX) 17 GM/SCOOP powder 1 capful daily     pregabalin (LYRICA) 25 MG capsule Take 25 mg by mouth 2 (two) times daily.     senna-docusate (SENOKOT S) 8.6-50 MG tablet Take 2 tablets by mouth daily. 60 tablet 5   spironolactone (ALDACTONE) 25 MG tablet TAKE 1 TABLET BY MOUTH IN THE  MORNING 90 tablet 3   telmisartan (MICARDIS) 20 MG tablet Take 1 tablet (20 mg total) by mouth daily. 90 tablet 1   Trospium Chloride 60 MG CP24 Take 1 capsule (60 mg total) by mouth daily. 30 capsule 3   Turmeric (QC TUMERIC COMPLEX PO) Take 1,000 mg by mouth daily.     Vitamin E 400  units TABS Take 400 Units by mouth daily.      No current facility-administered medications for this visit.    REVIEW OF SYSTEMS:    10 Point review of Systems was done is negative except as noted above.   PHYSICAL EXAMINATION  .BP (!) 142/78 (BP Location: Right Arm, Patient Position: Sitting)   Pulse 71   Temp 97.7 F (36.5 C) (Tympanic)   Resp 16   Wt 116 lb 9.6 oz (52.9 kg)   LMP  (LMP Unknown)   SpO2 99%   BMI 24.37 kg/m   GENERAL:alert, in no acute distress and comfortable SKIN: no acute rashes, no significant lesions EYES: conjunctiva are pink and non-injected, sclera anicteric OROPHARYNX: MMM, no exudates, no oropharyngeal erythema or ulceration NECK: supple, no JVD LYMPH:  no palpable lymphadenopathy in the cervical, axillary or  inguinal regions LUNGS: clear to auscultation b/l with normal respiratory effort HEART: regular rate & rhythm ABDOMEN:  normoactive bowel sounds , non tender, not distended. Extremity: no pedal edema PSYCH: alert & oriented x 3 with fluent speech NEURO: no focal motor/sensory deficits   LABORATORY DATA:  I have reviewed the data as listed  .    Latest Ref Rng & Units 01/16/2023   11:30 AM 06/22/2022    9:18 AM 01/17/2022   11:16 AM  CBC  WBC 4.0 - 10.5 K/uL 6.9  6.7  7.1   Hemoglobin 12.0 - 15.0 g/dL 16.1  09.6  04.5   Hematocrit 36.0 - 46.0 % 36.3  36.9  38.0   Platelets 150 - 400 K/uL 154  150  144     .    Latest Ref Rng & Units 01/16/2023   11:30 AM 06/22/2022    9:18 AM 12/22/2021    9:12 AM  CMP  Glucose 70 - 99 mg/dL 96  409  73   BUN 8 - 23 mg/dL 17  18  19    Creatinine 0.44 - 1.00 mg/dL 8.11  9.14  7.82   Sodium 135 - 145 mmol/L 133  133  135   Potassium 3.5 - 5.1 mmol/L 4.4  4.6  4.7   Chloride 98 - 111 mmol/L 99  102  101   CO2 22 - 32 mmol/L 31  26  28    Calcium 8.9 - 10.3 mg/dL 95.6  21.3  08.6   Total Protein 6.5 - 8.1 g/dL 7.4  7.8  6.5   Total Bilirubin 0.3 - 1.2 mg/dL 0.4  0.4  0.4   Alkaline Phos 38 - 126 U/L 63  89  69   AST 15 - 41 U/L 19  26  29    ALT 0 - 44 U/L 13  16  23       03/18/2020 Mammogram  05/30/2021 Mammogram        RADIOGRAPHIC STUDIES: I have personally reviewed the radiological images as listed and agreed with the findings in the report.  ASSESSMENT & PLAN:   89 y.o. female with  1. H/o Easily bruising - labs did not demonstrate any specific bleeding diathesis 2. Stage IB ER/PR/Her 2 negative, grade 3 left breast cancer s/p mastectomy. NegSNLBx. 06/04/2019. Was not considered to be a good candidate for adjuvant chemotherapy given her age and medical issues. 3. LUE lymphedema-left breast mastectomy and lymph node biopsy.  Controlled with use of lymphedema sleeve.  Discussed maintaining compliance with use of her  sleeve.  2)  Patient Active Problem List   Diagnosis Date Noted   Neuropathy 08/01/2022  Atherosclerosis of native artery of both lower extremities with rest pain (HCC) 07/09/2022   Interstitial pulmonary disease (HCC) 07/09/2022   Sciatica associated with disorder of multiple sites of spine 03/20/2021   Bipolar II disorder (HCC) 03/20/2021   Chronic pain syndrome 03/20/2021   Urinary retention 02/04/2020   Seasonal allergies 02/04/2020   Recurrent major depressive disorder, in partial remission (HCC) 07/17/2019   Status post left mastectomy 07/01/2019   Breast cancer of lower-outer quadrant of left female breast (HCC) 06/04/2019   Candida infection, oral 01/15/2019   Senile purpura (HCC) 04/14/2018   Lumbar post-laminectomy syndrome 12/03/2017   Lumbar spondylosis 12/03/2017   History of back surgery 09/01/2017   Constipation 09/01/2017   Hypothyroidism due to acquired atrophy of thyroid 09/01/2017   Mixed hyperlipidemia 09/01/2017   Age-related osteoporosis without current pathological fracture 09/01/2017   High risk medication use 09/01/2017   Arthritis of hand 05/17/2017   Atherosclerosis of native arteries of extremity with intermittent claudication (HCC) 05/15/2017   Status post total bilateral knee replacement 12/20/2016   Gait abnormality 07/16/2016   Chronic low back pain 07/16/2016   Mild cognitive impairment 07/16/2016   Wrist pain 05/10/2015   S/P left TKA 11/29/2014   S/P knee replacement 11/29/2014   Spontaneous bruising 08/27/2014   Numbness and tingling in right hand 07/28/2014   Essential tremor 07/28/2014   Dizziness 05/28/2014   Shaky 05/28/2014   Memory loss 05/28/2014   Depression 05/06/2014   Lipoma of left upper thigh 3x5 cm 09/28/2011   Cerebral vascular accident (HCC) 08/09/2011   Syncope 08/09/2011   History of breast cancer [id number], s/p BCT 2008, triple negative 01/26/2011   Chronic L breast pain with chronic recurrent seroma, s/p excisional  biopsy 01/12/2010 01/26/2011   Fainted    ICD (implantable cardioverter-defibrillator), biventricular, in situ 08/02/2010   Chronic systolic heart failure (HCC) 08/02/2010   Essential hypertension 08/02/2010   -continue f/u with PCP for mx of other chronic medical issues.  PLAN:  -Discussed lab results on 01/16/23 in detail with patient. CBC showed WBC of 6.9K, hemoglobin of 12.0, and platelets of 154K. -Her last CT scan lung findings showed no concern for cancer, but did show more prominent interstitial changes. Discussed option of referral to pulmonology for further evaluation.  -Patient has no clinical evidence suggestive of breast cancer recurrence/progression at this time.  -educated patient that the arteries do become harder with age, and the hardness of blood vessel may overestimates the blood pressure -informed patient that dehydration may overestimae blood pressure as well -discussed option of coconut water to lower blood pressure -informed patient that use of inhaler may elevate BP temoporarily.  -recommend patient to check BP first thing in morning before drinking any tea/coffee -CT chest scan on 07/14/2022 showed no evidence of metastatic disease in the chest, including no evidence of left chest wall tumor.  -did not feel any tumor in her left axillary. There are some irregularities in the bone. Some of which are from scar tissue, and some from bone spurs on edge of rib bone. There appears to be a clean incision in the area. Patient did have similar irregularities on her right side.  -informed patient that muscle relaxants may increase the risk of confusion or falls -informed patient that Skelaxin is known to cause sedation -okay for patient to take naps in afternoon for 1-2 hours to improve cognition and functioning -recommend at least 32 ounces of water daily, and optimally 2 L if she is able.  -  recommend taking muscle relaxants at night before bedtime -discussed option of OTC  at least 500 MG daily slow-release magnesium to reduce cramping  -recommend optimizing electrolytes -if options including using SlowMag and optimizing hydration/electrolyte does not improve lower extremity cramping, she may try OTC CoQ10 or taking omega-3 fatty acids.  -discussed option to repeat CT scan to evaluate interstitial changes, patient is agreeable to proceed with repeat CT scan -recommend patient to stay UTD with her age-appropriate vaccines, including pneumonia, flu, COVID-19, RSV, and shingles vaccines. -patient has a scheduled mammogram in December -answered all of patient's and her daughter's questions in detail  FOLLOW-UP: High resolution CT chest in 1 week Phone visit with Dr Candise Che in 2 weeks  The total time spent in the appointment was 30 minutes* .  All of the patient's questions were answered with apparent satisfaction. The patient knows to call the clinic with any problems, questions or concerns.   Wyvonnia Lora MD MS AAHIVMS [facility] Hematology/Oncology Physician [facility]  .*Total Encounter Time as defined by the Centers for Medicare and Medicaid Services includes, in addition to the face-to-face time of a patient visit (documented in the note above) non-face-to-face time: obtaining and reviewing outside history, ordering and reviewing medications, tests or procedures, care coordination (communications with other health care professionals or caregivers) and documentation in the medical record.    I,Mitra Faeizi,acting as a Neurosurgeon for Wyvonnia Lora, MD.,have documented all relevant documentation on the behalf of Wyvonnia Lora, MD,as directed by  Wyvonnia Lora, MD while in the presence of Wyvonnia Lora, MD.  .I have reviewed the above documentation for accuracy and completeness, and I agree with the above. Johney Maine MD

## 2023-01-22 ENCOUNTER — Encounter: Payer: Self-pay | Admitting: Hematology

## 2023-01-23 ENCOUNTER — Ambulatory Visit: Payer: Medicare Other | Attending: Internal Medicine

## 2023-01-23 DIAGNOSIS — Z01812 Encounter for preprocedural laboratory examination: Secondary | ICD-10-CM

## 2023-01-23 DIAGNOSIS — I428 Other cardiomyopathies: Secondary | ICD-10-CM

## 2023-01-24 LAB — CBC
Hematocrit: 38.9 % (ref 34.0–46.6)
Hemoglobin: 12.5 g/dL (ref 11.1–15.9)
MCH: 29.6 pg (ref 26.6–33.0)
MCHC: 32.1 g/dL (ref 31.5–35.7)
MCV: 92 fL (ref 79–97)
Platelets: 128 10*3/uL — ABNORMAL LOW (ref 150–450)
RBC: 4.23 x10E6/uL (ref 3.77–5.28)
RDW: 13.6 % (ref 11.7–15.4)
WBC: 9.3 10*3/uL (ref 3.4–10.8)

## 2023-01-24 LAB — COMPREHENSIVE METABOLIC PANEL
ALT: 18 [IU]/L (ref 0–32)
AST: 23 [IU]/L (ref 0–40)
Albumin: 4 g/dL (ref 3.7–4.7)
Alkaline Phosphatase: 76 [IU]/L (ref 44–121)
BUN/Creatinine Ratio: 22 (ref 12–28)
BUN: 18 mg/dL (ref 8–27)
Bilirubin Total: 0.2 mg/dL (ref 0.0–1.2)
CO2: 25 mmol/L (ref 20–29)
Calcium: 10.3 mg/dL (ref 8.7–10.3)
Chloride: 99 mmol/L (ref 96–106)
Creatinine, Ser: 0.81 mg/dL (ref 0.57–1.00)
Globulin, Total: 3 g/dL (ref 1.5–4.5)
Glucose: 108 mg/dL — ABNORMAL HIGH (ref 70–99)
Potassium: 4.6 mmol/L (ref 3.5–5.2)
Sodium: 136 mmol/L (ref 134–144)
Total Protein: 7 g/dL (ref 6.0–8.5)
eGFR: 71 mL/min/{1.73_m2} (ref >59)

## 2023-01-25 ENCOUNTER — Encounter: Payer: Self-pay | Admitting: Nurse Practitioner

## 2023-01-25 NOTE — Telephone Encounter (Signed)
Form printed and place in provider sign/review folder. Message routed to PCP Janyth Contes, Janene Harvey, NP

## 2023-01-27 ENCOUNTER — Encounter: Payer: Self-pay | Admitting: Internal Medicine

## 2023-01-28 ENCOUNTER — Ambulatory Visit (HOSPITAL_COMMUNITY)
Admission: RE | Admit: 2023-01-28 | Discharge: 2023-01-28 | Disposition: A | Discharge status: Home / Self Care | Payer: Medicare Other | Source: Ambulatory Visit | Attending: Hematology | Admitting: Hematology

## 2023-01-28 DIAGNOSIS — J849 Interstitial pulmonary disease, unspecified: Secondary | ICD-10-CM | POA: Diagnosis present

## 2023-01-29 ENCOUNTER — Ambulatory Visit (INDEPENDENT_AMBULATORY_CARE_PROVIDER_SITE_OTHER): Payer: Medicare Other | Admitting: Physician Assistant

## 2023-01-29 ENCOUNTER — Encounter: Payer: Self-pay | Admitting: Physician Assistant

## 2023-01-29 DIAGNOSIS — G301 Alzheimer's disease with late onset: Secondary | ICD-10-CM

## 2023-01-29 DIAGNOSIS — F02A0 Dementia in other diseases classified elsewhere, mild, without behavioral disturbance, psychotic disturbance, mood disturbance, and anxiety: Secondary | ICD-10-CM

## 2023-01-29 NOTE — Patient Instructions (Signed)
  You are doing great! Continue atendance to the Adult Day Program  Follow in 6 months

## 2023-01-29 NOTE — Progress Notes (Signed)
Assessment/Plan:   Dementia likely due to Alzheimer's disease, mild, late onset without behavioral disturbance   [name] is a very pleasant 89 y.o. RH female with a history of hypertension, hyperlipidemia, hypothyroidism, LBBB status post ICD placement, breast cancer status postmastectomy, chronic back pain, bipolar disorder, depression, anxiety, and mild dementia likely due to Alzheimer's disease seen today in follow up for memory loss. Patient is on antidementia medication due to undesirable side effects. She is not interested in galantamine at this time.  Memory is stable, MMSE today is 28/30.  She is independent with her ADLs and attends adult day program 3 times a week.  She no longer drives.   Follow up in 6  months. Recommend good control of her cardiovascular risk factors Continue to control mood as per PCP Continue attending Adult Day Program at [facility]    Subjective:    This patient is accompanied in the office by her daughter who supplements the history.  Previous records as well as any outside records available were reviewed prior to todays visit. Patient was last seen on 07/30/2022 with MMSE 30/30.   Any changes in memory since last visit? " Some good and bad days ". Sometimes she has difficulty with comprehension, names and recent conversations.  She attends the adult day program three times a week and enjoys it very much. repeats oneself?  Endorsed Disoriented when walking into a room?  Patient denies    Leaving objects in unusual places?  May misplace things but not in unusual places   Wandering behavior?  denies   Any personality changes since last visit?  denies   Any worsening depression?: this is the time that her husband passed so she has moments feeling down Hallucinations or paranoia?  Denies.   Seizures? denies    Any sleep changes?  Sleeps well.  Denies vivid dreams, REM behavior or sleepwalking   Sleep apnea?   Denies.   Any hygiene concerns?  Denies.  Independent of bathing and dressing?  Endorsed  Does the patient needs help with medications?  Patient is in charge  Who is in charge of the finances?  Daughter is in charge    Any changes in appetite?  Denies. Patient have trouble swallowing? Denies.   Does the patient cook? Yes, denies any issues. Denies forgetting common recipes  Any headaches?   denies   Chronic back pain  denies   Ambulates with difficulty? Denies.  She enjoys chair Zumba. Uses a cane  named [name] Recent falls or head injuries? denies     Unilateral weakness, numbness or tingling? Denies.   Any tremors?  Denies   Any anosmia?  Denies   Any incontinence of urine?  She has a history of OAB and has a pessary.  She has to wake up at night to urinate at least twice.  This is followed by urology.  She uses [employer].   Any bowel dysfunction?   Denies      Patient lives with her daughter  Does the patient drive? No longer drives    History on Initial Assessment 10/13/2020: This is an 89 year old right-handed woman with a history of hypertension, hyperlipidemia, hypothyroidism, LBBB s/p ICD placement, breast cancer s/p mastectomy, chronic back pain, bipolar depression, anxiety, presenting for evaluation of memory loss. She had been following with [facility] for memory loss since 2016, records were reviewed. She started noticing mild short-term memory changes in 2004 after lumbar surgery. Over the years, memory changes  have progressed, her daughter moved in with her in February 18, 2020 and she stopped driving. She had side effects on Donepezil, Rivastigmine, and Memantine.    She states her memory is "terrible." [name] reports memory changes became more noticeable after her husband passed away in Feb 18, 2019. She had left the stove on a couple of times and left keys outside the door. She would forget what she was going to say and forget conversations from the day prior. She would repeat herself at times. She manages her own medications,  [name] checks behind her. Her husband was managing finances, [name] took over when he passed away. She stopped driving in Jan/Feb 2021 because she was having staggering gait. She is independent with dressing and bathing. Notes from [facility] indicate she was tried on Donepezil in 2016/02/18, Memantine in February 17, 2017, Rivastigmine in 02/17/2018. She felt "like a mummy, could not think."    She denies any headaches, diplopia, dysarthria/dysphagia, neck pain, focal numbness/tingling/weakness, anosmia. She has chronic back pain and constipation. She has occasional dizziness. There are tremors in both hands affecting handwriting. Sleep is okay, she usually sleeps 8 hours at night. Her last fall was in January 2022 when she fell off the bed. She had worked with physical therapy and staggering gait has stabilized. Mood is "sometimes stable," she is more irritable with herself when she could not remember things. [name] notices she is more anxious when she has to leave. No hallucinations or paranoia. No family history of dementia, history of significant head injuries, or alcohol use.    PREVIOUS MEDICATIONS:   CURRENT MEDICATIONS:  Outpatient Encounter Medications as of 01/29/2023  Medication Sig   acetaminophen (TYLENOL) 650 MG CR tablet Take 650 mg by mouth at bedtime.   albuterol (VENTOLIN HFA) 108 (90 Base) MCG/ACT inhaler Inhale 2 puffs into the lungs every 6 (six) hours as needed for wheezing or shortness of breath.   ascorbic acid (VITAMIN C) 500 MG tablet Take 500 mg by mouth daily.   buPROPion (WELLBUTRIN XL) 150 MG 24 hr tablet Take 1 tablet (150 mg total) by mouth daily.   carvedilol (COREG) 3.125 MG tablet TAKE 1 TABLET BY MOUTH TWICE  DAILY   Cholecalciferol (VITAMIN D3) 50 MCG (2000 UT) TABS Take 2,000 Units by mouth daily.    diclofenac Sodium (PENNSAID) 2 % SOLN APPLY 2 PUMPS (40GM) TO AFFECTED AREA UP TO 2 TIMES PER DAY AS DIRECTED.   DULoxetine (CYMBALTA) 60 MG capsule Take 60 mg by mouth daily.   estradiol  (ESTRACE) 0.1 MG/GM vaginal cream Place 0.5 g vaginally 2 (two) times a week. Place 0.5g nightly for two weeks then twice a week after   famotidine (PEPCID) 10 MG tablet Take 10 mg by mouth as needed for heartburn or indigestion.   furosemide (LASIX) 20 MG tablet Take 1 tablet (20 mg total) by mouth daily as needed.   levothyroxine (SYNTHROID) 88 MCG tablet Take 1 tablet (88 mcg total) by mouth daily.   Lidocaine 4 % PTCH Apply 1 patch topically every 12 (twelve) hours. Apply to back   lubiprostone (AMITIZA) 8 MCG capsule TAKE ONE CAPSULE BY MOUTH TWICE DAILY WITH A MEAL   Magnesium 250 MG TABS Take 250 mg by mouth daily.   metaxalone (SKELAXIN) 800 MG tablet Take 1 tablet (800 mg total) by mouth 3 (three) times daily as needed for muscle spasms.   Misc Natural Products (PUMPKIN SEED OIL) CAPS Take 1 capsule by mouth daily.   NALTREXONE HCL, PAIN, PO Take 4 mg  by mouth at bedtime.   NONFORMULARY OR COMPOUNDED ITEM Apply 120 Tubes topically daily. Diclofenac/Cyclobenzaprine/lamotrigine/lidocaine/prilocaine (69629) 2%/2%/6%/5%/1.25% cream QTY: 120 GM SIG: (NEURO) APPLY 1-2 PUMPS (1-2 GMS) TO AFFECTED AREA (S) OR FOCAL POINTS 3 TO 4 TIMES DAILY. RUB IN FOR 2 MINUTES TO ACHIEVE MAX PENETRATION. WASH HANDS WELL.   Omega-3 Fatty Acids (FISH OIL PO) Take 1 capsule by mouth daily.   omeprazole (PRILOSEC) 40 MG capsule TAKE 1 CAPSULE BY MOUTH DAILY   polyethylene glycol powder (GLYCOLAX/MIRALAX) 17 GM/SCOOP powder 1 capful daily   pregabalin (LYRICA) 25 MG capsule Take 25 mg by mouth 2 (two) times daily.   senna-docusate (SENOKOT S) 8.6-50 MG tablet Take 2 tablets by mouth daily.   spironolactone (ALDACTONE) 25 MG tablet TAKE 1 TABLET BY MOUTH IN THE  MORNING   telmisartan (MICARDIS) 20 MG tablet Take 1 tablet (20 mg total) by mouth daily.   Trospium Chloride 60 MG CP24 Take 1 capsule (60 mg total) by mouth daily.   Turmeric (QC TUMERIC COMPLEX PO) Take 1,000 mg by mouth daily.   Vitamin E 400 units  TABS Take 400 Units by mouth daily.    No facility-administered encounter medications on file as of 01/29/2023.       01/29/2023   12:00 PM 07/30/2022    1:00 PM 05/18/2022    3:02 PM  MMSE - Mini Mental State Exam  Orientation to time 4 5 5   Orientation to Place 5 5 5   Registration 3 3 3   Attention/ Calculation 5 5 2   Recall 3 3 3   Language- name 2 objects 2 2 2   Language- repeat 1 1 1   Language- follow 3 step command 3 3 3   Language- read & follow direction 1 1 1   Write a sentence 1 1 1   Copy design 0 1 1  Total score 28 30 27       10/13/2020   10:00 AM  Montreal Cognitive Assessment   Visuospatial/ Executive (0/5) 2  Naming (0/3) 3  Attention: Read list of digits (0/2) 2  Attention: Read list of letters (0/1) 1  Attention: Serial 7 subtraction starting at 100 (0/3) 2  Language: Repeat phrase (0/2) 1  Language : Fluency (0/1) 1  Abstraction (0/2) 1  Delayed Recall (0/5) 2  Orientation (0/6) 6  Total 21  Adjusted Score (based on education) 21    Objective:     PHYSICAL EXAMINATION:    VITALS:  There were no vitals filed for this visit.  GEN:  The patient appears stated age and is in NAD. HEENT:  Normocephalic, atraumatic.   Neurological examination:  General: NAD, well-groomed, appears stated age. Orientation: The patient is alert. Oriented to person, place and not to date Cranial nerves: There is good facial symmetry.The speech is fluent and clear. No aphasia or dysarthria. Fund of knowledge is appropriate. Recent and remote memory are impaired. Attention and concentration are reduced.  Able to name objects and repeat phrases.  Hearing is intact to conversational tone.   Sensation: Sensation is intact to light touch throughout Motor: Strength is at least antigravity x4. DTR's 2/4 in UE/LE     Movement examination: Tone: There is normal tone in the UE/LE Abnormal movements:  no tremor.  No myoclonus.  No asterixis.   Coordination:  There is no decremation  with RAM's. Normal finger to nose  Gait and Station: The patient has no difficulty arising out of a deep-seated chair without the use of the hands. The patient's stride  length is good.  Gait is cautious and narrow.    Thank you for allowing [place] the opportunity to participate in the care of this nice patient. Please do not hesitate to contact us for any questions or concerns.   Total time spent on today's visit was 24 minutes dedicated to this patient today, preparing to see patient, examining the patient, ordering tests and/or medications and counseling the patient, documenting clinical information in the EHR or other health record, independently interpreting results and communicating results to the patient/family, discussing treatment and goals, answering patient's questions and coordinating care.  Cc:  [name], NP  Marlowe Kays 01/29/2023 12:46 PM

## 2023-01-30 ENCOUNTER — Telehealth: Payer: Self-pay | Admitting: *Deleted

## 2023-01-30 ENCOUNTER — Encounter: Payer: Self-pay | Admitting: Physical Medicine and Rehabilitation

## 2023-01-30 ENCOUNTER — Encounter
Payer: Medicare Other | Attending: Physical Medicine and Rehabilitation | Admitting: Physical Medicine and Rehabilitation

## 2023-01-30 VITALS — BP 126/78 | HR 77 | Ht <= 58 in | Wt 117.0 lb

## 2023-01-30 DIAGNOSIS — G5703 Lesion of sciatic nerve, bilateral lower limbs: Secondary | ICD-10-CM | POA: Diagnosis present

## 2023-01-30 DIAGNOSIS — M5442 Lumbago with sciatica, left side: Secondary | ICD-10-CM | POA: Diagnosis present

## 2023-01-30 DIAGNOSIS — M5441 Lumbago with sciatica, right side: Secondary | ICD-10-CM | POA: Diagnosis present

## 2023-01-30 DIAGNOSIS — G8929 Other chronic pain: Secondary | ICD-10-CM | POA: Diagnosis present

## 2023-01-30 DIAGNOSIS — G894 Chronic pain syndrome: Secondary | ICD-10-CM | POA: Diagnosis present

## 2023-01-30 DIAGNOSIS — R269 Unspecified abnormalities of gait and mobility: Secondary | ICD-10-CM | POA: Insufficient documentation

## 2023-01-30 MED ORDER — LIDOCAINE 5 % EX OINT: 1.0000 | TOPICAL_OINTMENT | CUTANEOUS | 5 refills | Status: DC | PRN

## 2023-01-30 NOTE — Telephone Encounter (Signed)
Yes- I sent /called it in for her for 1 year- thanks- ML

## 2023-01-30 NOTE — Patient Instructions (Signed)
Pt is an 89 yr old female with hx of breast CA and RUE lymphedema, as well as uterine prolapse, NICCM- as well as Chronic low back pain with B/L sciatica- also has memory issues on Aricept; hx of lumbar fusion at L4/5. Has a hx of Bipolar Type II depression. Is on Zoloft 200 mg daily, Trilpetal and Wellbutrin for mood.  Here for f/u on Lumbar radiculopathy   Can take Metaxalone 400 mg as needed-  the maximum is 5 days/week. 2x/day-  can even take less than 1 x/week- it just depends on your symptoms. Suggest if takes it, take at night right before bed- if NEEDED  2.  Con't Naltrexone 5 mg nightly for pain.  Will all in 3 months supply- with 3 refills- so 1 year supply- called into [facility].     3.  Con't Duloxetine 60 mg nightly-   4. Con't Lyrica 25 mg 2x/day- doesn't need refills today   5. Can try pedal bike- or elliptical- when sitting on sofa.  Can get with foot "clasp" to keep foot on pedal. Start with no more than 10 minutes day initially- if not sore, can increase timing by 5 minutes.   6. For piriformis syndrome- Sit on a tennis ball- 5-10 minutes/day on each side- not at same time- if tennis ball too hard, throw in dryer.   Cross leg over other leg- and pull leg to chest- and that stretches piriformis muscle   7. Wait on Piriformis steroid injection.  Let's see how # 6 treatment works first.    8. Called in Lidocaine ointment 50 G  up to 4x/day as needed for pain- 5%- can mix with Voltaren gel and apply together- both can apply up to 4x/day.  -can also use compounded medicine with Lidocaine /procaine/ Diclofenac/Flexeril/lamictal- all into 1 lotion and then apply-   9. F/U in 3 months-

## 2023-01-30 NOTE — Telephone Encounter (Signed)
Pharmacy is trying to request a refill on the naltrexone. Did you order this for her?

## 2023-01-30 NOTE — Progress Notes (Signed)
Subjective:    Patient ID: [name], female    DOB: [birth year], 89 y.o.   MRN: [id number]  HPI Pt is an 89 yr old female with hx of breast CA and RUE lymphedema, as well as uterine prolapse, NICCM- as well as Chronic low back pain with B/L sciatica- also has memory issues on Aricept; hx of lumbar fusion at L4/5. Has a hx of Bipolar Type II depression. Is on Zoloft 200 mg daily, Trilpetal and Wellbutrin for mood.  Here for f/u on Lumbar radiculopathy    A question about Naltrexone- taking 4 mg-    Skelaxin- taking 400 mg 1/2 tab since 800 mg makes her too sleepy.  Taking 1/2 tab 2x/day.  Helps the spasms- but still sleepy at the lower dose.    Brain fogginess- MMSE 28/30-  likely due to Alzheimer's.  And suggested any meds that could make her foggy, to try to reduce or take them away.   Lyrica 25 mg BID- went back up again to 2x/day when she started c/o pain again when tried to reduce dose.    Also taking Duloxetine 60 mg    Pedal bike  is on the plan, but hasn't been ordered yet.    More complaints in buttocks area.   Pain Inventory Average Pain 0 Pain Right Now 0 My pain is constant and dull  In the last 24 hours, has pain interfered with the following? General activity 0 Relation with others 0 Enjoyment of life 0 What TIME of day is your pain at its worst? night Sleep (in general) Fair  Pain is worse with: bending, sitting, standing, and some activites Pain improves with: rest, therapy/exercise, pacing activities, and medication Relief from Meds: 5  Family History  Problem Relation Age of Onset   Hypertension Mother    Arthritis Mother    Hypertension Father    Hypertension Brother    Hypertension Brother    Social History   Socioeconomic History   Marital status: Married    Spouse name: Not on file   Number of children: 1   Years of education: Masters   Highest education level: Master's degree (e.g., MA, MS, MEng, MEd, MSW, MBA)   Occupational History   Occupation: Retired  Tobacco Use   Smoking status: Never    Passive exposure: Never   Smokeless tobacco: Never  Vaping Use   Vaping status: Never Used  Substance and Sexual Activity   Alcohol use: No   Drug use: No   Sexual activity: Not Currently    Birth control/protection: Post-menopausal  Other Topics Concern   Not on file  Social History Narrative   Lives at home with husband.   Right-handed.      As of 07/28/2014   Diet: No special diet   Caffeine: yes, Chocolate, tea and sodas    Married: YES, 1970   House: Yes, 2 stories, 2-3 persons live in home   Pets: No   Current/Past profession: Nurse, mental health, Publishing rights manager    Exercise: Yes 2-3 x weekly   Living Will: Yes   DNR: No   POA/HPOA: No      Social Determinants of Health   Financial Resource Strain: Low Risk  (07/05/2022)   Overall Financial Resource Strain (CARDIA)    Difficulty of Paying Living Expenses: Not very hard  Food Insecurity: No Food Insecurity (07/05/2022)   Hunger Vital Sign    Worried About Running Out of Food in the Last Year: Never true  Ran Out of Food in the Last Year: Never true  Transportation Needs: No Transportation Needs (07/05/2022)   PRAPARE - Administrator, Civil Service (Medical): No    Lack of Transportation (Non-Medical): No  Physical Activity: Unknown (07/05/2022)   Exercise Vital Sign    Days of Exercise per Week: Patient declined    Minutes of Exercise per Session: Not on file  Stress: Patient Declined (07/05/2022)   [employer] of Occupational Health - Occupational Stress Questionnaire    Feeling of Stress : Patient declined  Social Connections: Unknown (07/05/2022)   Social Connection and Isolation Panel [NHANES]    Frequency of Communication with Friends and Family: Twice a week    Frequency of Social Gatherings with Friends and Family: Patient declined    Attends Religious Services: More than 4 times per year    Active Member  of [employer] or Organizations: Yes    Attends Banker Meetings: Not on file    Marital Status: Widowed   Past Surgical History:  Procedure Laterality Date   BACK SURGERY     lumbar fusion    BREAST LUMPECTOMY Left 2008   BREAST SURGERY  2000   LUMP REMOVAL. STAGE 1 CANCER   CARDIAC CATHETERIZATION     CATARACT EXTRACTION     COLONOSCOPY     EYE SURGERY     IMPLANTABLE CARDIOVERTER DEFIBRILLATOR GENERATOR CHANGE N/A 12/18/2012   Procedure: IMPLANTABLE CARDIOVERTER DEFIBRILLATOR GENERATOR CHANGE;  Surgeon: Marinus Maw, MD;  Location: [facility] CATH LAB;  Service: Cardiovascular;  Laterality: N/A;   JOINT REPLACEMENT  06/14/01   right   LUMBAR FUSION  2006   MASS EXCISION  11/08/2011   Procedure: EXCISION MASS;  Surgeon: Almond Lint, MD;  Location: [facility] ORS;  Service: General;  Laterality: Left;  Excision Left Thigh Mass   MASTECTOMY W/ SENTINEL NODE BIOPSY Left 06/04/2019   Procedure: LEFT MASTECTOMY WITH SENTINEL LYMPH NODE BIOPSY;  Surgeon: Almond Lint, MD;  Location: [facility] OR;  Service: General;  Laterality: Left;   MASTECTOMY, PARTIAL  2008   GOT PACEMAKER AND DEFIB AT THAT TIME   PACEMAKER INSERTION  04/23/06   TOTAL KNEE ARTHROPLASTY  05/17/01   RIGHT KNEE   TOTAL KNEE ARTHROPLASTY Left 11/29/2014   Procedure: TOTAL LEFT KNEE ARTHROPLASTY;  Surgeon: Durene Romans, MD;  Location: [facility];  Service: Orthopedics;  Laterality: Left;   Past Surgical History:  Procedure Laterality Date   BACK SURGERY     lumbar fusion    BREAST LUMPECTOMY Left 2008   BREAST SURGERY  2000   LUMP REMOVAL. STAGE 1 CANCER   CARDIAC CATHETERIZATION     CATARACT EXTRACTION     COLONOSCOPY     EYE SURGERY     IMPLANTABLE CARDIOVERTER DEFIBRILLATOR GENERATOR CHANGE N/A 12/18/2012   Procedure: IMPLANTABLE CARDIOVERTER DEFIBRILLATOR GENERATOR CHANGE;  Surgeon: Marinus Maw, MD;  Location: [facility] CATH LAB;  Service: Cardiovascular;  Laterality: N/A;   JOINT REPLACEMENT  06/14/01   right   LUMBAR FUSION  2006    MASS EXCISION  11/08/2011   Procedure: EXCISION MASS;  Surgeon: Almond Lint, MD;  Location: [facility];  Service: General;  Laterality: Left;  Excision Left Thigh Mass   MASTECTOMY W/ SENTINEL NODE BIOPSY Left 06/04/2019   Procedure: LEFT MASTECTOMY WITH SENTINEL LYMPH NODE BIOPSY;  Surgeon: Almond Lint, MD;  Location: [facility] OR;  Service: General;  Laterality: Left;   MASTECTOMY, PARTIAL  2008   GOT PACEMAKER AND DEFIB AT  THAT TIME   PACEMAKER INSERTION  04/23/06   TOTAL KNEE ARTHROPLASTY  05/17/01   RIGHT KNEE   TOTAL KNEE ARTHROPLASTY Left 11/29/2014   Procedure: TOTAL LEFT KNEE ARTHROPLASTY;  Surgeon: Durene Romans, MD;  Location: [facility];  Service: Orthopedics;  Laterality: Left;   Past Medical History:  Diagnosis Date   Arthritis    Cancer (HCC)    left breast cancer    Cataracts, bilateral    removed by surgery   CHF (congestive heart failure) (HCC)    PACEMAKER & DEFIB   Complication of anesthesia    hypotensive after back surgery in 2006   Depression    Dyslipidemia    Fainted 04/21/06   AT [facility]   GERD (gastroesophageal reflux disease)    Headache(784.0)    Hearing loss    bilateral hearing aids   HLD (hyperlipidemia)    diet controlled    Hypertension    Hypothyroidism    ICD (implantable cardiac defibrillator) in place    pt has pacer/icd   ICD (implantable cardiac defibrillator), biventricular, in situ    LBBB (left bundle branch block)    Memory loss    Nonischemic cardiomyopathy (HCC)    Normal coronary arteries    s/p cardiac cath 2007   Pacemaker    ICD [facility]   Syncope    Systolic CHF [facility])    Vertigo    Wears glasses    BP 126/78   Pulse 77   Ht 4\' 10"  (1.473 m)   Wt 117 lb (53.1 kg)   LMP  (LMP Unknown)   SpO2 96%   BMI 24.45 kg/m   Opioid Risk Score:   Fall Risk Score:  `1  Depression screen [facility] 2/9     01/30/2023   10:38 AM 08/01/2022   10:51 AM 07/09/2022   10:41 AM 05/18/2022    3:02 PM 05/16/2022    2:29 PM 04/30/2022   11:05 AM  01/22/2022   11:26 AM  Depression screen [facility] 2/9  Decreased Interest 1 0 0 0 0 0 1  Down, Depressed, Hopeless 1 0 0 0 0 0 1  PHQ - 2 Score 2 0 0 0 0 0 2    Review of Systems  Musculoskeletal:  Positive for back pain and gait problem.       Buttock pain, back in both legs  All other systems reviewed and are negative.      Objective:   Physical Exam  Awake, alert, appropriate, accompanied by daughter, walks HHA, NAD MMSE done by Neuro 28/30 L low back TTP - has muscle spasms Having SI joint pain and less so Piriformis pain B/L       Assessment & Plan:   Pt is an 89 yr old female with hx of breast CA and RUE lymphedema, as well as uterine prolapse, NICCM- as well as Chronic low back pain with B/L sciatica- also has memory issues on Aricept; hx of lumbar fusion at L4/5. Has a hx of Bipolar Type II depression. Is on Zoloft 200 mg daily, Trilpetal and Wellbutrin for mood.  Here for f/u on Lumbar radiculopathy   Can take Metaxalone 400 mg as needed-  the maximum is 5 days/week. 2x/day-  can even take less than 1 x/week- it just depends on your symptoms. Suggest if takes it, take at night right before bed- if NEEDED  2.  Con't Naltrexone 5 mg nightly for pain.  Will all in 3 months supply- with 3  refills- so 1 year supply- called into [facility] pharmacy.     3.  Con't Duloxetine 60 mg nightly-   4. Con't Lyrica 25 mg 2x/day- doesn't need refills today   5. Can try pedal bike- or elliptical- when sitting on sofa.  Can get with foot "clasp" to keep foot on pedal. Start with no more than 10 minutes day initially- if not sore, can increase timing by 5 minutes.   6. For piriformis syndrome- Sit on a tennis ball- 5-10 minutes/day on each side- not at same time- if tennis ball too hard, throw in dryer.   Cross leg over other leg- and pull leg to chest- and that stretches piriformis muscle   7. Wait on Piriformis steroid injection.  Let's see how # 6 treatment works first.    8.  Called in Lidocaine ointment 50 G  up to 4x/day as needed for pain- 5%- can mix with Voltaren gel and apply together- both can apply up to 4x/day.  -can also use compounded medicine with Lidocaine /procaine/ Diclofenac/Flexeril/lamictal- all into 1 lotion and then apply-   9. F/U in 3 months-    I spent a total of 32   minutes on total care today- >50% coordination of care- due to

## 2023-01-31 ENCOUNTER — Ambulatory Visit (INDEPENDENT_AMBULATORY_CARE_PROVIDER_SITE_OTHER): Payer: Medicare Other

## 2023-01-31 DIAGNOSIS — I428 Other cardiomyopathies: Secondary | ICD-10-CM | POA: Diagnosis not present

## 2023-01-31 LAB — CUP PACEART REMOTE DEVICE CHECK
Brady Statistic RA Percent Paced: 0 %
Brady Statistic RV Percent Paced: 100 %
Date Time Interrogation Session: 20241024055500
HighPow Impedance: 48 Ohm
Implantable Lead Connection Status: 753985
Implantable Lead Connection Status: 753985
Implantable Lead Connection Status: 753985
Implantable Lead Implant Date: 20080116
Implantable Lead Implant Date: 20080116
Implantable Lead Implant Date: 20080116
Implantable Lead Location: 753859
Implantable Lead Location: 753860
Implantable Lead Location: 753860
Implantable Lead Model: 157
Implantable Lead Model: 4469
Implantable Lead Model: 4555
Implantable Lead Serial Number: 136532
Implantable Lead Serial Number: 161542
Implantable Lead Serial Number: 473495
Implantable Pulse Generator Implant Date: 20140911
Lead Channel Impedance Value: 399 Ohm
Lead Channel Impedance Value: 508 Ohm
Lead Channel Impedance Value: 820 Ohm
Lead Channel Pacing Threshold Amplitude: 0.7 V
Lead Channel Pacing Threshold Amplitude: 0.7 V
Lead Channel Pacing Threshold Amplitude: 1.1 V
Lead Channel Pacing Threshold Pulse Width: 0.4 ms
Lead Channel Pacing Threshold Pulse Width: 0.4 ms
Lead Channel Pacing Threshold Pulse Width: 0.8 ms
Lead Channel Setting Pacing Amplitude: 2 V
Lead Channel Setting Pacing Amplitude: 2 V
Lead Channel Setting Pacing Amplitude: 2.2 V
Lead Channel Setting Pacing Pulse Width: 0.4 ms
Lead Channel Setting Pacing Pulse Width: 0.8 ms
Lead Channel Setting Sensing Sensitivity: 0.6 mV
Lead Channel Setting Sensing Sensitivity: 1 mV
Pulse Gen Serial Number: 111765

## 2023-01-31 NOTE — Telephone Encounter (Signed)
Form completed and given to [facility]

## 2023-01-31 NOTE — Telephone Encounter (Signed)
Form was received and faxed with confirmation

## 2023-02-01 ENCOUNTER — Inpatient Hospital Stay: Payer: Medicare Other | Admitting: Hematology

## 2023-02-05 NOTE — Pre-Procedure Instructions (Signed)
Instructed patient on the following items: Arrival time 0630 Nothing to eat or drink after midnight No meds AM of procedure Responsible person to drive you home and stay with you for 24 hrs

## 2023-02-06 ENCOUNTER — Other Ambulatory Visit: Payer: Self-pay

## 2023-02-06 ENCOUNTER — Encounter (HOSPITAL_COMMUNITY)
Admission: RE | Disposition: A | Discharge status: Home / Self Care | Payer: Medicare Other | Source: Home / Self Care | Attending: Internal Medicine

## 2023-02-06 ENCOUNTER — Ambulatory Visit (HOSPITAL_COMMUNITY): Admit: 2023-02-06 | Payer: Medicare Other | Admitting: Internal Medicine

## 2023-02-06 ENCOUNTER — Ambulatory Visit (HOSPITAL_COMMUNITY)
Admission: RE | Admit: 2023-02-06 | Discharge: 2023-02-06 | Disposition: A | Discharge status: Home / Self Care | Payer: Medicare Other | Attending: Internal Medicine | Admitting: Internal Medicine

## 2023-02-06 DIAGNOSIS — I110 Hypertensive heart disease with heart failure: Secondary | ICD-10-CM | POA: Insufficient documentation

## 2023-02-06 DIAGNOSIS — Z79899 Other long term (current) drug therapy: Secondary | ICD-10-CM | POA: Diagnosis not present

## 2023-02-06 DIAGNOSIS — R634 Abnormal weight loss: Secondary | ICD-10-CM | POA: Insufficient documentation

## 2023-02-06 DIAGNOSIS — I5022 Chronic systolic (congestive) heart failure: Secondary | ICD-10-CM | POA: Insufficient documentation

## 2023-02-06 DIAGNOSIS — Z9012 Acquired absence of left breast and nipple: Secondary | ICD-10-CM | POA: Insufficient documentation

## 2023-02-06 DIAGNOSIS — Z4502 Encounter for adjustment and management of automatic implantable cardiac defibrillator: Secondary | ICD-10-CM | POA: Diagnosis present

## 2023-02-06 DIAGNOSIS — I447 Left bundle-branch block, unspecified: Secondary | ICD-10-CM | POA: Diagnosis not present

## 2023-02-06 DIAGNOSIS — C50919 Malignant neoplasm of unspecified site of unspecified female breast: Secondary | ICD-10-CM | POA: Insufficient documentation

## 2023-02-06 HISTORY — PX: BIV ICD GENERATOR CHANGEOUT: EP1194

## 2023-02-06 SURGERY — BIV ICD GENERATOR CHANGEOUT

## 2023-02-06 MED ORDER — LIDOCAINE HCL (PF) 1 % IJ SOLN
INTRAMUSCULAR | Status: AC
  Filled 2023-02-06: qty 60

## 2023-02-06 MED ORDER — LIDOCAINE HCL (PF) 1 % IJ SOLN
INTRAMUSCULAR | Status: DC | PRN
  Administered 2023-02-06: 60 mL

## 2023-02-06 MED ORDER — CEFAZOLIN SODIUM-DEXTROSE 2-4 GM/100ML-% IV SOLN
2.0000 g | INTRAVENOUS | Status: AC
  Administered 2023-02-06: 2 g via INTRAVENOUS

## 2023-02-06 MED ORDER — CHLORHEXIDINE GLUCONATE 4 % EX SOLN: 4.0000 | Freq: Once | CUTANEOUS | Status: DC

## 2023-02-06 MED ORDER — CEFAZOLIN SODIUM-DEXTROSE 2-4 GM/100ML-% IV SOLN
INTRAVENOUS | Status: AC
  Filled 2023-02-06: qty 100

## 2023-02-06 MED ORDER — SODIUM CHLORIDE 0.9 % IV SOLN
80.0000 mg | INTRAVENOUS | Status: AC
  Administered 2023-02-06: 80 mg

## 2023-02-06 MED ORDER — POVIDONE-IODINE 10 % EX SWAB
2.0000 | Freq: Once | CUTANEOUS | Status: AC
  Administered 2023-02-06: 2 via TOPICAL

## 2023-02-06 MED ORDER — ONDANSETRON HCL 4 MG/2ML IJ SOLN: 4.0000 mg | Freq: Four times a day (QID) | INTRAMUSCULAR | Status: DC | PRN

## 2023-02-06 MED ORDER — SODIUM CHLORIDE 0.9 % IV SOLN: INTRAVENOUS | Status: DC

## 2023-02-06 MED ORDER — ACETAMINOPHEN 325 MG PO TABS: 325.0000 mg | ORAL_TABLET | ORAL | Status: DC | PRN

## 2023-02-06 MED ORDER — SODIUM CHLORIDE 0.9 % IV SOLN
INTRAVENOUS | Status: AC
  Filled 2023-02-06: qty 2

## 2023-02-06 SURGICAL SUPPLY — 6 items
CABLE SURGICAL S-101-97-12 (CABLE) ×1 IMPLANT
ICD MOMENTUM G125 (ICD Generator) IMPLANT
PAD DEFIB RADIO PHYSIO CONN (PAD) ×1 IMPLANT
POUCH AIGIS-R ANTIBACT ICD (Mesh General) ×1 IMPLANT
POUCH AIGIS-R ANTIBACT ICD LRG (Mesh General) IMPLANT
TRAY PACEMAKER INSERTION (PACKS) ×1 IMPLANT

## 2023-02-06 NOTE — Discharge Instructions (Addendum)

## 2023-02-06 NOTE — Interval H&P Note (Signed)
History and Physical Interval Note:  02/06/2023 7:43 AM  [facility]  has presented today for surgery, with the diagnosis of eri.  The various methods of treatment have been discussed with the patient and family. After consideration of risks, benefits and other options for treatment, the patient has consented to  Procedure(s): BIV ICD GENERATOR CHANGEOUT (N/A) as a surgical intervention.  The patient's history has been reviewed, patient examined, no change in status, stable for surgery.  I have reviewed the patient's chart and labs.  Questions were answered to the patient's satisfaction.     Lewayne Bunting

## 2023-02-06 NOTE — H&P (Signed)
HPI [name] returns today for followup. She is a pleasant 89 yo woman with a h/o chronic systolic heart failure, LBBB, s/p biv ICD insertion. She notes some weight loss and dyspnea though she has become more sedentary. She does ok on flat ground. No ICD shocks. She has been diagnosed with breast CA and undergone mastectomy. She has done well so far after breast surgery. She is still a little sore. she gets a little dizzy when she gets up in the morning. her weight is down about 10 lbs in 18 months.      Allergies       Allergies  Allergen Reactions   Iodine Shortness Of Breath      Iodine contrast, CHF , SOB   Shellfish Allergy Shortness Of Breath   Memantine        Malaise, fogginess/ couldn't think   Aspirin Nausea And Vomiting   Codeine Nausea And Vomiting                Current Outpatient Medications  Medication Sig Dispense Refill   acetaminophen (TYLENOL) 650 MG CR tablet Take 650 mg by mouth at bedtime.       albuterol (VENTOLIN HFA) 108 (90 Base) MCG/ACT inhaler Inhale 2 puffs into the lungs every 6 (six) hours as needed for wheezing or shortness of breath. 8 g 11   ascorbic acid (VITAMIN C) 500 MG tablet Take 500 mg by mouth daily.       buPROPion (WELLBUTRIN XL) 150 MG 24 hr tablet Take 1 tablet (150 mg total) by mouth daily.       carvedilol (COREG) 3.125 MG tablet TAKE 1 TABLET BY MOUTH TWICE  DAILY 180 tablet 3   Cholecalciferol (VITAMIN D3) 50 MCG (2000 UT) TABS Take 2,000 Units by mouth daily.        diclofenac Sodium (PENNSAID) 2 % SOLN APPLY 2 PUMPS (40GM) TO AFFECTED AREA UP TO 2 TIMES PER DAY AS DIRECTED. 224 g 0   DULoxetine (CYMBALTA) 60 MG capsule Take 60 mg by mouth daily.       estradiol (ESTRACE) 0.1 MG/GM vaginal cream Place 0.5 g vaginally 2 (two) times a week. Place 0.5g nightly for two weeks then twice a week after 30 g 11   famotidine (PEPCID) 10 MG tablet Take 10 mg by mouth as needed for heartburn or indigestion.       furosemide (LASIX)  20 MG tablet Take 1 tablet (20 mg total) by mouth daily as needed. 90 tablet 3   levothyroxine (SYNTHROID) 88 MCG tablet Take 1 tablet (88 mcg total) by mouth daily. 90 tablet 3   Lidocaine 4 % PTCH Apply 1 patch topically every 12 (twelve) hours. Apply to back       lubiprostone (AMITIZA) 8 MCG capsule TAKE ONE CAPSULE BY MOUTH TWICE DAILY WITH A MEAL 180 capsule 1   Magnesium 250 MG TABS Take 250 mg by mouth daily.       metaxalone (SKELAXIN) 800 MG tablet Take 1 tablet (800 mg total) by mouth 3 (three) times daily as needed for muscle spasms. 60 tablet 5   Misc Natural Products (PUMPKIN SEED OIL) CAPS Take 1 capsule by mouth daily. 180 capsule 0   NALTREXONE HCL, PAIN, PO Take 4 mg by mouth at bedtime.       NONFORMULARY OR COMPOUNDED ITEM Apply 120 Tubes topically daily. Diclofenac/Cyclobenzaprine/lamotrigine/lidocaine/prilocaine (53664) 2%/2%/6%/5%/1.25% cream QTY: 120 GM SIG: (NEURO) APPLY 1-2  PUMPS (1-2 GMS) TO AFFECTED AREA (S) OR FOCAL POINTS 3 TO 4 TIMES DAILY. RUB IN FOR 2 MINUTES TO ACHIEVE MAX PENETRATION. WASH HANDS WELL. 1 each 2   Omega-3 Fatty Acids (FISH OIL PO) Take 1 capsule by mouth daily.       omeprazole (PRILOSEC) 40 MG capsule TAKE 1 CAPSULE BY MOUTH DAILY 90 capsule 3   polyethylene glycol powder (GLYCOLAX/MIRALAX) 17 GM/SCOOP powder 1 capful daily       pregabalin (LYRICA) 25 MG capsule Take 25 mg by mouth 2 (two) times daily.       senna-docusate (SENOKOT S) 8.6-50 MG tablet Take 2 tablets by mouth daily. 60 tablet 5   spironolactone (ALDACTONE) 25 MG tablet TAKE 1 TABLET BY MOUTH IN THE  MORNING 90 tablet 3   telmisartan (MICARDIS) 20 MG tablet Take 1 tablet (20 mg total) by mouth daily. 90 tablet 1   Trospium Chloride 60 MG CP24 Take 1 capsule (60 mg total) by mouth daily. 30 capsule 5   Turmeric (QC TUMERIC COMPLEX PO) Take 1,000 mg by mouth daily.       Vitamin E 400 units TABS Take 400 Units by mouth daily.           No current facility-administered medications  for this visit.              Past Medical History:  Diagnosis Date   Arthritis     Cancer (HCC)      left breast cancer    Cataracts, bilateral      removed by surgery   CHF (congestive heart failure) (HCC)      PACEMAKER & DEFIB   Complication of anesthesia      hypotensive after back surgery in 2006   Depression     Dyslipidemia     Fainted 04/21/06    AT [facility]   GERD (gastroesophageal reflux disease)     Headache(784.0)     Hearing loss      bilateral hearing aids   HLD (hyperlipidemia)      diet controlled    Hypertension     Hypothyroidism     ICD (implantable cardiac defibrillator) in place      pt has pacer/icd   ICD (implantable cardiac defibrillator), biventricular, in situ     LBBB (left bundle branch block)     Memory loss     Nonischemic cardiomyopathy (HCC)     Normal coronary arteries      s/p cardiac cath 2007   Pacemaker      ICD [employer]   Syncope     Systolic CHF [facility])     Vertigo     Wears glasses            ROS:    All systems reviewed and negative except as noted in the HPI.          Past Surgical History:  Procedure Laterality Date   BACK SURGERY        lumbar fusion    BREAST LUMPECTOMY Left 2008   BREAST SURGERY   2000    LUMP REMOVAL. STAGE 1 CANCER   CARDIAC CATHETERIZATION       CATARACT EXTRACTION       COLONOSCOPY       EYE SURGERY       IMPLANTABLE CARDIOVERTER DEFIBRILLATOR GENERATOR CHANGE N/A 12/18/2012    Procedure: IMPLANTABLE CARDIOVERTER DEFIBRILLATOR GENERATOR CHANGE;  Surgeon: Marinus Maw, MD;  Location[facility] CATH LAB;  Service: Cardiovascular;  Laterality: N/A;   JOINT REPLACEMENT   06/14/01    right   LUMBAR FUSION   2006   MASS EXCISION   11/08/2011    Procedure: EXCISION MASS;  Surgeon: Almond Lint, MD;  Location: [facility] ORS;  Service: General;  Laterality: Left;  Excision Left Thigh Mass   MASTECTOMY W/ SENTINEL NODE BIOPSY Left 06/04/2019    Procedure: LEFT MASTECTOMY WITH SENTINEL LYMPH NODE BIOPSY;   Surgeon: Almond Lint, MD;  Location: [facility] OR;  Service: General;  Laterality: Left;   MASTECTOMY, PARTIAL   2008    GOT PACEMAKER AND DEFIB AT THAT TIME   PACEMAKER INSERTION   04/23/06   TOTAL KNEE ARTHROPLASTY   05/17/01    RIGHT KNEE   TOTAL KNEE ARTHROPLASTY Left 11/29/2014    Procedure: TOTAL LEFT KNEE ARTHROPLASTY;  Surgeon: Durene Romans, MD;  Location: [facility] ORS;  Service: Orthopedics;  Laterality: Left;                 Family History  Problem Relation Age of Onset   Hypertension Mother     Arthritis Mother     Hypertension Father     Hypertension Brother     Hypertension Brother              Social History         Socioeconomic History   Marital status: Married      Spouse name: Not on file   Number of children: 1   Years of education: Masters   Highest education level: Master's degree (e.g., MA, MS, MEng, MEd, MSW, MBA)  Occupational History   Occupation: Retired  Tobacco Use   Smoking status: Never      Passive exposure: Never   Smokeless tobacco: Never  Vaping Use   Vaping status: Never Used  Substance and Sexual Activity   Alcohol use: No   Drug use: No   Sexual activity: Not Currently      Birth control/protection: Post-menopausal  Other Topics Concern   Not on file  Social History Narrative    Lives at home with husband.    Right-handed.         As of 07/28/2014    Diet: No special diet    Caffeine: yes, Chocolate, tea and sodas     Married: YES, 1970    House: Yes, 2 stories, 2-3 persons live in home    Pets: No    Current/Past profession: Nurse, mental health, Publishing rights manager     Exercise: Yes 2-3 x weekly    Living Will: Yes    DNR: No    POA/HPOA: No         Social Determinants of Health        Financial Resource Strain: Low Risk  (07/05/2022)    Overall Financial Resource Strain (CARDIA)     Difficulty of Paying Living Expenses: Not very hard  Food Insecurity: No Food Insecurity (07/05/2022)    Hunger Vital Sign     Worried About  Running Out of Food in the Last Year: Never true     Ran Out of Food in the Last Year: Never true  Transportation Needs: No Transportation Needs (07/05/2022)    PRAPARE - Therapist, art (Medical): No     Lack of Transportation (Non-Medical): No  Physical Activity: Unknown (07/05/2022)    Exercise Vital Sign     Days of Exercise per Week: Patient declined  Minutes of Exercise per Session: Not on file  Stress: Patient Declined (07/05/2022)    [employer] of Occupational Health - Occupational Stress Questionnaire     Feeling of Stress : Patient declined  Social Connections: Unknown (07/05/2022)    Social Connection and Isolation Panel [NHANES]     Frequency of Communication with Friends and Family: Twice a week     Frequency of Social Gatherings with Friends and Family: Patient declined     Attends Religious Services: More than 4 times per year     Active Member of [employer] or Organizations: Yes     Attends Banker Meetings: Not on file     Marital Status: Widowed  Intimate Partner Violence: Not At Risk (03/27/2017)    Humiliation, Afraid, Rape, and Kick questionnaire     Fear of Current or Ex-Partner: No     Emotionally Abused: No     Physically Abused: No     Sexually Abused: No        BP 126/74   Pulse 70   Ht 4\' 10"  (1.473 m)   Wt 112 lb (50.8 kg)   LMP  (LMP Unknown)   SpO2 96%   BMI 23.41 kg/m    Physical Exam:   Well appearing NAD HEENT: Unremarkable Neck:  No JVD, no thyromegally Lymphatics:  No adenopathy Back:  No CVA tenderness Lungs:  Clear HEART:  Regular rate rhythm, no murmurs, no rubs, no clicks Abd:  soft, positive bowel sounds, no organomegally, no rebound, no guarding Ext:  2 plus pulses, no edema, no cyanosis, no clubbing Skin:  No rashes no nodules Neuro:  CN II through XII intact, motor grossly intact   EKG   DEVICE  Normal device function.  See PaceArt for details.    Assess/Plan:    Chronic  systolic heart failure - her symptoms remain class 2. She will continue her current meds. 2. ICD - her [employer] Sci DDD biv ICD is working normally. She has reached ERI. She will undergo gen change out. 3. HTN - her bp is controlled. No change in meds. 4. Weight loss - her weight is unchanged since her last visit.  Sharlot Gowda Zyonna Vardaman,MD

## 2023-02-06 NOTE — Progress Notes (Signed)
Patient and daughter was given discharge instructions. Both verbalized understanding. 

## 2023-02-07 ENCOUNTER — Encounter (HOSPITAL_COMMUNITY): Payer: Self-pay | Admitting: Internal Medicine

## 2023-02-08 ENCOUNTER — Inpatient Hospital Stay: Payer: Medicare Other | Attending: Hematology | Admitting: Hematology

## 2023-02-11 ENCOUNTER — Encounter: Payer: Self-pay | Admitting: Nurse Practitioner

## 2023-02-11 ENCOUNTER — Ambulatory Visit (INDEPENDENT_AMBULATORY_CARE_PROVIDER_SITE_OTHER): Payer: Medicare Other | Admitting: Nurse Practitioner

## 2023-02-11 VITALS — BP 118/70 | HR 69 | Temp 97.1°F | Ht <= 58 in | Wt 118.0 lb

## 2023-02-11 DIAGNOSIS — F3181 Bipolar II disorder: Secondary | ICD-10-CM

## 2023-02-11 DIAGNOSIS — I5022 Chronic systolic (congestive) heart failure: Secondary | ICD-10-CM | POA: Diagnosis not present

## 2023-02-11 DIAGNOSIS — G629 Polyneuropathy, unspecified: Secondary | ICD-10-CM

## 2023-02-11 DIAGNOSIS — G5703 Lesion of sciatic nerve, bilateral lower limbs: Secondary | ICD-10-CM

## 2023-02-11 DIAGNOSIS — F0390 Unspecified dementia without behavioral disturbance: Secondary | ICD-10-CM | POA: Diagnosis not present

## 2023-02-11 DIAGNOSIS — E039 Hypothyroidism, unspecified: Secondary | ICD-10-CM

## 2023-02-11 DIAGNOSIS — K5904 Chronic idiopathic constipation: Secondary | ICD-10-CM

## 2023-02-11 NOTE — Progress Notes (Signed)
Careteam: Patient Care Team: [name], NP as PCP - General (Geriatric Medicine) Marinus Maw, MD as PCP - Electrophysiology (Cardiology) Almond Lint, MD as Consulting Physician (General Surgery) Durene Romans, MD as Consulting Physician (Orthopedic Surgery) Blima Ledger, OD (Optometry) Levert Feinstein, MD as Consulting Physician (Neurology) Marinus Maw, MD as Consulting Physician (Cardiology) Marcellina Millin, MD (Psychiatry)  PLACE OF SERVICE:  [facility]  Advanced Directive information    Allergies  Allergen Reactions   Iodine Shortness Of Breath    Iodine contrast, CHF , SOB   Shellfish Allergy Shortness Of Breath   Memantine     Malaise, fogginess/ couldn't think   Aspirin Nausea And Vomiting   Codeine Nausea And Vomiting    Chief Complaint  Patient presents with   Medical Management of Chronic Issues    3 mont follow-up. Discuss need for  TD/Tdap. Patient is requesting to have site examined near procedure area. Here with daughter, [name]      HPI: Patient is a 89 y.o. female for routine follow up.   She went to pain clinic last week- using skelaxin as needed at this time.  Pain comes and goes. Using maltrexone at bedtime.  She does not know what makes pain worse but will wake up with bad cramping She is now taking skelaxin 400 mg at bedtime has been effective in helping the am leg cramps.   Constipation has been well controlled. Using senna s and amitiza for bowels   Recently has pacer replacement, she still has original bandage on- was not sure if she could remove it but is very tight.   Continues to follow up with neurologist due to memory issues. Overall all memory has been stable.   Review of Systems:  Review of Systems  Constitutional:  Negative for chills, fever and weight loss.  HENT:  Negative for tinnitus.   Respiratory:  Negative for cough, sputum production and shortness of breath.   Cardiovascular:  Negative for chest  pain, palpitations and leg swelling.  Gastrointestinal:  Negative for abdominal pain, constipation, diarrhea and heartburn.  Genitourinary:  Negative for dysuria, frequency and urgency.  Musculoskeletal:  Positive for myalgias. Negative for back pain, falls and joint pain.  Skin: Negative.   Neurological:  Negative for dizziness and headaches.  Psychiatric/Behavioral:  Negative for depression and memory loss. The patient does not have insomnia.     Past Medical History:  Diagnosis Date   Arthritis    Cancer (HCC)    left breast cancer    Cataracts, bilateral    removed by surgery   CHF (congestive heart failure) (HCC)    PACEMAKER & DEFIB   Complication of anesthesia    hypotensive after back surgery in 2006   Depression    Dyslipidemia    Fainted 04/21/06   AT [facility]   GERD (gastroesophageal reflux disease)    Headache(784.0)    Hearing loss    bilateral hearing aids   HLD (hyperlipidemia)    diet controlled    Hypertension    Hypothyroidism    ICD (implantable cardiac defibrillator) in place    pt has pacer/icd   ICD (implantable cardiac defibrillator), biventricular, in situ    LBBB (left bundle branch block)    Memory loss    Nonischemic cardiomyopathy (HCC)    Normal coronary arteries    s/p cardiac cath 2007   Pacemaker    ICD [facility] [employer]   Syncope    Systolic CHF (  HCC)    Vertigo    Wears glasses    Past Surgical History:  Procedure Laterality Date   BACK SURGERY     lumbar fusion    BIV ICD GENERATOR CHANGEOUT N/A 02/06/2023   Procedure: BIV ICD GENERATOR CHANGEOUT;  Surgeon: Marinus Maw, MD;  Location: [facility] INVASIVE CV LAB;  Service: Cardiovascular;  Laterality: N/A;   BREAST LUMPECTOMY Left 2008   BREAST SURGERY  2000   LUMP REMOVAL. STAGE 1 CANCER   CARDIAC CATHETERIZATION     CATARACT EXTRACTION     COLONOSCOPY     EYE SURGERY     IMPLANTABLE CARDIOVERTER DEFIBRILLATOR GENERATOR CHANGE N/A 12/18/2012   Procedure: IMPLANTABLE CARDIOVERTER  DEFIBRILLATOR GENERATOR CHANGE;  Surgeon: Marinus Maw, MD;  Location: [facility] CATH LAB;  Service: Cardiovascular;  Laterality: N/A;   JOINT REPLACEMENT  06/14/01   right   LUMBAR FUSION  2006   MASS EXCISION  11/08/2011   Procedure: EXCISION MASS;  Surgeon: Almond Lint, MD;  Location: [facility] ORS;  Service: General;  Laterality: Left;  Excision Left Thigh Mass   MASTECTOMY W/ SENTINEL NODE BIOPSY Left 06/04/2019   Procedure: LEFT MASTECTOMY WITH SENTINEL LYMPH NODE BIOPSY;  Surgeon: Almond Lint, MD;  Location: [facility] OR;  Service: General;  Laterality: Left;   MASTECTOMY, PARTIAL  2008   GOT PACEMAKER AND DEFIB AT THAT TIME   PACEMAKER INSERTION  04/23/06   TOTAL KNEE ARTHROPLASTY  05/17/01   RIGHT KNEE   TOTAL KNEE ARTHROPLASTY Left 11/29/2014   Procedure: TOTAL LEFT KNEE ARTHROPLASTY;  Surgeon: Durene Romans, MD;  Location: [facility] OR[facility];  Service: Orthopedics;  Laterality: Left;   Social History:   reports that she has never smoked. She has never been exposed to tobacco smoke. She has never used smokeless tobacco. She reports that she does not drink alcohol and does not use drugs.  Family History  Problem Relation Age of Onset   Hypertension Mother    Arthritis Mother    Hypertension Father    Hypertension Brother    Hypertension Brother     Medications: Patient's Medications  New Prescriptions   No medications on file  Previous Medications   ACETAMINOPHEN (TYLENOL) 500 MG TABLET    Take 1,000 mg by mouth every morning.   ACETAMINOPHEN (TYLENOL) 650 MG CR TABLET    Take 650 mg by mouth at bedtime.   ALBUTEROL (VENTOLIN HFA) 108 (90 BASE) MCG/ACT INHALER    Inhale 2 puffs into the lungs every 6 (six) hours as needed for wheezing or shortness of breath.   ASCORBIC ACID (VITAMIN C) 500 MG TABLET    Take 500 mg by mouth daily.   BUPROPION (WELLBUTRIN XL) 150 MG 24 HR TABLET    Take 1 tablet (150 mg total) by mouth daily.   CARVEDILOL (COREG) 3.125 MG TABLET    TAKE 1 TABLET BY MOUTH TWICE  DAILY    CHOLECALCIFEROL (VITAMIN D3) 50 MCG (2000 UT) TABS    Take 2,000 Units by mouth daily.    DICLOFENAC SODIUM (PENNSAID) 2 % SOLN    APPLY 2 PUMPS (40GM) TO AFFECTED AREA UP TO 2 TIMES PER DAY AS DIRECTED.   DULOXETINE (CYMBALTA) 60 MG CAPSULE    Take 60 mg by mouth daily.   ESTRADIOL (ESTRACE) 0.1 MG/GM VAGINAL CREAM    Place 0.5 g vaginally 2 (two) times a week. Place 0.5g nightly for two weeks then twice a week after   FAMOTIDINE (PEPCID) 10 MG TABLET  Take 10 mg by mouth daily as needed for heartburn or indigestion.   FUROSEMIDE (LASIX) 20 MG TABLET    Take 1 tablet (20 mg total) by mouth daily as needed.   LEVOTHYROXINE (SYNTHROID) 88 MCG TABLET    Take 1 tablet (88 mcg total) by mouth daily.   LIDOCAINE (XYLOCAINE) 5 % OINTMENT    Apply 1 Application topically as needed (up to 4x/day as needed).   LIDOCAINE 4 % PTCH    Apply 1 patch topically every 12 (twelve) hours. Apply to back   LUBIPROSTONE (AMITIZA) 8 MCG CAPSULE    TAKE ONE CAPSULE BY MOUTH TWICE DAILY WITH A MEAL   MAGNESIUM 250 MG TABS    Take 250 mg by mouth daily.   METAXALONE (SKELAXIN) 800 MG TABLET    Take 1 tablet (800 mg total) by mouth 3 (three) times daily as needed for muscle spasms.   MISC NATURAL PRODUCTS (PUMPKIN SEED OIL) CAPS    Take 1 capsule by mouth daily.   NALTREXONE HCL, PAIN, PO    Take 4 mg by mouth at bedtime.   NONFORMULARY OR COMPOUNDED ITEM    Apply 120 Tubes topically daily. Diclofenac/Cyclobenzaprine/lamotrigine/lidocaine/prilocaine (59563) 2%/2%/6%/5%/1.25% cream QTY: 120 GM SIG: (NEURO) APPLY 1-2 PUMPS (1-2 GMS) TO AFFECTED AREA (S) OR FOCAL POINTS 3 TO 4 TIMES DAILY. RUB IN FOR 2 MINUTES TO ACHIEVE MAX PENETRATION. WASH HANDS WELL.   OMEGA-3 FATTY ACIDS (FISH OIL) 1200 MG CAPS    Take 1,200 mg by mouth daily.   OMEPRAZOLE (PRILOSEC) 40 MG CAPSULE    TAKE 1 CAPSULE BY MOUTH DAILY   POLYETHYL GLYCOL-PROPYL GLYCOL (SYSTANE) 0.4-0.3 % SOLN    Place 1 drop into both eyes at bedtime.   POLYETHYLENE GLYCOL  3350 PO    Take 1 Capful by mouth daily.   PREGABALIN (LYRICA) 25 MG CAPSULE    Take 25 mg by mouth 2 (two) times daily.   SENNA-DOCUSATE (SENOKOT S) 8.6-50 MG TABLET    Take 2 tablets by mouth daily.   SPIRONOLACTONE (ALDACTONE) 25 MG TABLET    TAKE 1 TABLET BY MOUTH IN THE  MORNING   TELMISARTAN (MICARDIS) 20 MG TABLET    Take 1 tablet (20 mg total) by mouth daily.   TROSPIUM CHLORIDE 60 MG CP24    Take 1 capsule (60 mg total) by mouth daily.   TURMERIC (QC TUMERIC COMPLEX PO)    Take 1,000 mg by mouth daily.   VITAMIN E 400 UNITS TABS    Take 400 Units by mouth daily.   Modified Medications   No medications on file  Discontinued Medications   No medications on file    Physical Exam:  Vitals:   02/11/23 1426  BP: 118/70  Pulse: 69  Temp: (!) 97.1 F (36.2 C)  TempSrc: Temporal  SpO2: 99%  Weight: 118 lb (53.5 kg)  Height: 4\' 10"  (1.473 m)   Body mass index is 24.66 kg/m. Wt Readings from Last 3 Encounters:  02/11/23 118 lb (53.5 kg)  02/06/23 114 lb (51.7 kg)  01/30/23 117 lb (53.1 kg)    Physical Exam Constitutional:      General: She is not in acute distress.    Appearance: She is well-developed. She is not diaphoretic.  HENT:     Head: Normocephalic and atraumatic.     Mouth/Throat:     Pharynx: No oropharyngeal exudate.  Eyes:     Conjunctiva/sclera: Conjunctivae normal.     Pupils: Pupils are equal, round,  and reactive to light.  Cardiovascular:     Rate and Rhythm: Normal rate and regular rhythm.     Heart sounds: Normal heart sounds.  Pulmonary:     Effort: Pulmonary effort is normal.     Breath sounds: Normal breath sounds.  Abdominal:     General: Bowel sounds are normal.     Palpations: Abdomen is soft.  Musculoskeletal:     Cervical back: Normal range of motion and neck supple.     Right lower leg: No edema.     Left lower leg: No edema.  Skin:    General: Skin is warm and dry.  Neurological:     Mental Status: She is alert.  Psychiatric:         Mood and Affect: Mood normal.     Labs reviewed: Basic Metabolic Panel: Recent Labs    06/22/22 0918 07/09/22 1143 08/13/22 1018 01/16/23 1130 01/23/23 0956  NA 133*  --   --  133* 136  K 4.6  --   --  4.4 4.6  CL 102  --   --  99 99  CO2 26  --   --  31 25  GLUCOSE 123*  --   --  96 108*  BUN 18  --   --  17 18  CREATININE 0.76  --   --  0.82 0.81  CALCIUM 10.1  --   --  10.1 10.3  TSH  --  0.14* 0.80  --   --    Liver Function Tests: Recent Labs    06/22/22 0918 01/16/23 1130 01/23/23 0956  AST 26 19 23   ALT 16 13 18   ALKPHOS 89 63 76  BILITOT 0.4 0.4 0.2  PROT 7.8 7.4 7.0  ALBUMIN 3.6 3.9 4.0   No results for input(s): "LIPASE", "AMYLASE" in the last 8760 hours. No results for input(s): "AMMONIA" in the last 8760 hours. CBC: Recent Labs    06/22/22 0918 01/16/23 1130 01/23/23 0956  WBC 6.7 6.9 9.3  NEUTROABS 4.2 4.3  --   HGB 12.2 12.0 12.5  HCT 36.9 36.3 38.9  MCV 86.8 89.4 92  PLT 150 154 128*   Lipid Panel: Recent Labs    07/09/22 1143  CHOL 146  HDL 57  LDLCALC 75  TRIG 67  CHOLHDL 2.6   TSH: Recent Labs    07/09/22 1143 08/13/22 1018  TSH 0.14* 0.80   A1C: No results found for: "HGBA1C"   Assessment/Plan 1. Acquired hypothyroidism TSH at goal on labs in May, will follow up at next appt. Continues on synthroid 88 mcg  2. Chronic systolic heart failure (HCC) Stable, recently underwent replacement for BIV ICD generator, incision healing well. No signs of infection.   3. Bipolar II disorder (HCC) Mood is stable, continue on current regime n  4. Dementia without behavioral disturbance (HCC) -Stable, no acute changes in cognitive or functional status, continue supportive care.   5. Neuropathy -stable on lyrica BID  6. Piriformis syndrome of both sides Working on finding proper regimen, appears stable at this time, continues to follow up with pain management.   7.constipation Controlled on amitiza with senna   Return  in about 4 months (around 06/11/2023) for routine follow up .  [name]. Biagio Borg [facility] [phone]

## 2023-02-14 NOTE — Progress Notes (Signed)
This encounter was created in error - please disregard.

## 2023-02-15 ENCOUNTER — Other Ambulatory Visit: Payer: Self-pay

## 2023-02-15 DIAGNOSIS — C50912 Malignant neoplasm of unspecified site of left female breast: Secondary | ICD-10-CM

## 2023-02-15 DIAGNOSIS — J849 Interstitial pulmonary disease, unspecified: Secondary | ICD-10-CM

## 2023-02-15 NOTE — Progress Notes (Signed)
Contacted pt's daughter per Dr Candise Che to :  let patient know there is increased fibrosis on her CT chest, likely related to sarcoidosis. She was seen by Dr Wonda Horner in 2022 and was to f/u in 1 yr but hasn't. refer her to f/u with DR Francine Graven.  RTC with me in 6 months with labs.   Pt's daughter acknowledged information and verbalized understanding.

## 2023-02-18 ENCOUNTER — Telehealth: Payer: Self-pay | Admitting: Hematology

## 2023-02-18 NOTE — Telephone Encounter (Signed)
Left patient daughter a message in regards to scheudled appointment time/dates for follow up

## 2023-02-19 NOTE — Progress Notes (Signed)
Remote ICD transmission.   

## 2023-02-20 ENCOUNTER — Ambulatory Visit: Payer: Medicare Other | Attending: Cardiology

## 2023-02-20 DIAGNOSIS — I5022 Chronic systolic (congestive) heart failure: Secondary | ICD-10-CM | POA: Diagnosis not present

## 2023-02-20 LAB — CUP PACEART INCLINIC DEVICE CHECK
Date Time Interrogation Session: 20241113115550
HighPow Impedance: 35 Ohm
Implantable Lead Connection Status: 753985
Implantable Lead Connection Status: 753985
Implantable Lead Connection Status: 753985
Implantable Lead Implant Date: 20080116
Implantable Lead Implant Date: 20080116
Implantable Lead Implant Date: 20080116
Implantable Lead Location: 753858
Implantable Lead Location: 753859
Implantable Lead Location: 753860
Implantable Lead Model: 157
Implantable Lead Model: 4469
Implantable Lead Model: 4555
Implantable Lead Serial Number: 136532
Implantable Lead Serial Number: 161542
Implantable Lead Serial Number: 473495
Implantable Pulse Generator Implant Date: 20241030
Lead Channel Impedance Value: 384 Ohm
Lead Channel Impedance Value: 464 Ohm
Lead Channel Impedance Value: 807 Ohm
Lead Channel Pacing Threshold Amplitude: 0.6 V
Lead Channel Pacing Threshold Amplitude: 0.7 V
Lead Channel Pacing Threshold Amplitude: 1 V
Lead Channel Pacing Threshold Pulse Width: 0.4 ms
Lead Channel Pacing Threshold Pulse Width: 0.4 ms
Lead Channel Pacing Threshold Pulse Width: 0.8 ms
Lead Channel Sensing Intrinsic Amplitude: 14.9 mV
Lead Channel Sensing Intrinsic Amplitude: 25 mV
Lead Channel Sensing Intrinsic Amplitude: 5.2 mV
Lead Channel Setting Pacing Amplitude: 2 V
Lead Channel Setting Pacing Amplitude: 2 V
Lead Channel Setting Pacing Amplitude: 2.2 V
Lead Channel Setting Pacing Pulse Width: 0.4 ms
Lead Channel Setting Pacing Pulse Width: 0.8 ms
Lead Channel Setting Sensing Sensitivity: 0.5 mV
Lead Channel Setting Sensing Sensitivity: 1 mV
Pulse Gen Serial Number: 157167

## 2023-02-20 NOTE — Patient Instructions (Signed)

## 2023-02-20 NOTE — Progress Notes (Signed)
Wound check appointment. Steri-strips removed. Wound without redness.  Slight edema. Incision edges approximated, wound well healed. Normal device function. Thresholds, sensing, and impedances consistent with implant measurements. Device programmed at chronic outputs post gen change. Histogram distribution appropriate for patient and level of activity. No mode switches or ventricular arrhythmias noted. Patient educated about wound care and shock plan. ROV in 3 months with implanting physician.

## 2023-03-01 ENCOUNTER — Other Ambulatory Visit: Payer: Self-pay | Admitting: Nurse Practitioner

## 2023-03-01 DIAGNOSIS — I10 Essential (primary) hypertension: Secondary | ICD-10-CM

## 2023-03-05 ENCOUNTER — Encounter: Payer: Medicare Other | Admitting: Internal Medicine

## 2023-03-06 NOTE — Progress Notes (Signed)
Precertification check for Urethral Bulking Insurance: [employer] portal checked CPT code: 32440, [id number] Dx code: N39.3 (No)  Prior auth needed when Medicare is the primary

## 2023-03-11 NOTE — Progress Notes (Signed)
This encounter was created in error - please disregard.

## 2023-03-28 ENCOUNTER — Other Ambulatory Visit (HOSPITAL_COMMUNITY)
Admission: RE | Admit: 2023-03-28 | Discharge: 2023-03-28 | Disposition: A | Discharge status: Home / Self Care | Payer: Medicare Other | Source: Ambulatory Visit | Attending: Obstetrics and Gynecology | Admitting: Obstetrics and Gynecology

## 2023-03-28 ENCOUNTER — Encounter: Payer: Self-pay | Admitting: Obstetrics and Gynecology

## 2023-03-28 ENCOUNTER — Other Ambulatory Visit (HOSPITAL_COMMUNITY)
Admission: RE | Admit: 2023-03-28 | Payer: Medicare Other | Source: Ambulatory Visit | Admitting: Obstetrics and Gynecology

## 2023-03-28 ENCOUNTER — Ambulatory Visit: Payer: Medicare Other | Admitting: Obstetrics and Gynecology

## 2023-03-28 VITALS — BP 109/72 | HR 73

## 2023-03-28 DIAGNOSIS — N898 Other specified noninflammatory disorders of vagina: Secondary | ICD-10-CM | POA: Diagnosis present

## 2023-03-28 DIAGNOSIS — R350 Frequency of micturition: Secondary | ICD-10-CM | POA: Diagnosis not present

## 2023-03-28 DIAGNOSIS — N393 Stress incontinence (female) (male): Secondary | ICD-10-CM

## 2023-03-28 DIAGNOSIS — R82998 Other abnormal findings in urine: Secondary | ICD-10-CM | POA: Diagnosis present

## 2023-03-28 LAB — POCT URINALYSIS DIPSTICK
Bilirubin, UA: NEGATIVE
Blood, UA: NEGATIVE
Glucose, UA: NEGATIVE
Ketones, UA: NEGATIVE
Nitrite, UA: NEGATIVE
Protein, UA: NEGATIVE
Spec Grav, UA: 1.015 (ref 1.010–1.025)
Urobilinogen, UA: 0.2 U/dL
pH, UA: 7 (ref 5.0–8.0)

## 2023-03-28 MED ORDER — SULFAMETHOXAZOLE-TRIMETHOPRIM 800-160 MG PO TABS: 1.0000 | ORAL_TABLET | Freq: Two times a day (BID) | ORAL | 0 refills | Status: AC

## 2023-03-28 NOTE — Progress Notes (Signed)
[facility]   Subjective:     Chief Complaint:  Chief Complaint  Patient presents with   urethral bulking     [name] is a 89 y.o. female here today for urethral bulking.   History of Present Illness: [name] is a 89 y.o. female mixed incontinence and prolapse. Currently has a ring with support pessary. She presented today with urethral bulking. However urine showed small leukocytes and she endorses "pain" which she states is worse with holding her urine. She also states she has some pain on the vaginal area which has been present for a while. She is using estrogen twice a week and A&D ointment for moisture. Due to the pain she has been having, she did use some neosporin.   She feels the pumpkin seed oil has been helping prevent some accidents.   Past Medical History: Patient  has a past medical history of Arthritis, Cancer (HCC), Cataracts, bilateral, CHF (congestive heart failure) (HCC), Complication of anesthesia, Depression, Dyslipidemia, Fainted (04/21/06), GERD (gastroesophageal reflux disease), Headache(784.0), Hearing loss, HLD (hyperlipidemia), Hypertension, Hypothyroidism, ICD (implantable cardiac defibrillator) in place, ICD (implantable cardiac defibrillator), biventricular, in situ, LBBB (left bundle branch block), Memory loss, Nonischemic cardiomyopathy (HCC), Normal coronary arteries, Pacemaker, Syncope, Systolic CHF (HCC), Vertigo, and Wears glasses.   Past Surgical History: She  has a past surgical history that includes Total knee arthroplasty (05/17/01); Lumbar fusion (2006); Mastectomy, partial (2008); Breast surgery (2000); Joint replacement (06/14/01); Pacemaker insertion (04/23/06); Back surgery; Cardiac catheterization; Mass excision (11/08/2011); implantable cardioverter defibrillator generator change (N/A, 12/18/2012); Cataract extraction; Eye surgery; Total knee arthroplasty (Left, 11/29/2014); Breast lumpectomy (Left, 2008); Colonoscopy;  Mastectomy w/ sentinel node biopsy (Left, 06/04/2019); and BIV ICD GENERATOR CHANGEOUT (N/A, 02/06/2023).   Medications: She has a current medication list which includes the following prescription(s): acetaminophen, acetaminophen, albuterol, ascorbic acid, bupropion, carvedilol, vitamin d3, diclofenac sodium, duloxetine, estradiol, famotidine, furosemide, levothyroxine, lidocaine, lidocaine, lubiprostone, magnesium, metaxalone, pumpkin seed oil, naltrexone hcl (pain), NONFORMULARY OR COMPOUNDED ITEM, fish oil, omeprazole, systane, polyethylene glycol 3350, pregabalin, senna-docusate, spironolactone, sulfamethoxazole-trimethoprim, telmisartan, trospium chloride, turmeric, and vitamin e.   Allergies: Patient is allergic to iodine, shellfish allergy, memantine, aspirin, and codeine.   Social History: Patient  reports that she has never smoked. She has never been exposed to tobacco smoke. She has never used smokeless tobacco. She reports that she does not drink alcohol and does not use drugs.      Objective:    Physical Exam: BP 109/72   Pulse 73   LMP  (LMP Unknown)  Gen: No apparent distress, A&O x 3. Detailed Urogynecologic Evaluation:  Pelvic Exam:Clitoral hood with white, spotty lesions (see picture).   Bartholin's and Skene's glands normal in appearance; urethral meatus normal in appearance, no urethral masses or discharge. The pessary was noted to be in place. It was removed and cleaned. Speculum exam revealed no lesions in the vagina. Aptima swab obtained of discharge.  The pessary was replaced. It was comfortable to the patient and fit well.     Assessment/Plan:    Assessment: [name] is a 89 y.o. with prolapse and mixed incontinence.   Plan: - Pessary cleaned today- continue with q3 month cleanings - White lesions on clitoral hood were not present previously and could be the source of her irritation. ? Lichen sclerosus vs HSV. Advised her to continue with estrace cream and  A&D and if lesions are present at next visit, will find an area to biopsy.  - Aptima swab  sent to rule out BV or yeast.  - Urine sent for culture - Will reschedule her urethral bulking today. Will plan for prophylactic antibiotics 2 days before the procedure to prevent UTI with next visit.   Marguerita Beards, MD

## 2023-03-29 LAB — URINE CULTURE: Culture: NO GROWTH

## 2023-03-29 LAB — CERVICOVAGINAL ANCILLARY ONLY
Bacterial Vaginitis (gardnerella): NEGATIVE
Candida Glabrata: NEGATIVE
Candida Vaginitis: NEGATIVE
Comment: NEGATIVE
Comment: NEGATIVE
Comment: NEGATIVE

## 2023-04-09 LAB — HM MAMMOGRAPHY

## 2023-04-12 ENCOUNTER — Other Ambulatory Visit: Payer: Self-pay | Admitting: Nurse Practitioner

## 2023-04-12 DIAGNOSIS — K5904 Chronic idiopathic constipation: Secondary | ICD-10-CM

## 2023-04-15 ENCOUNTER — Other Ambulatory Visit: Payer: Self-pay | Admitting: Nurse Practitioner

## 2023-04-15 NOTE — Telephone Encounter (Signed)
 Patient medication has Allergy Contraindication. Medication pend and sent to PCP Janyth Contes Janene Harvey, NP

## 2023-04-16 ENCOUNTER — Other Ambulatory Visit: Payer: Self-pay

## 2023-04-16 MED ORDER — NONFORMULARY OR COMPOUNDED ITEM: 120.0000 | Freq: Every day | 2 refills | Status: DC

## 2023-04-16 NOTE — Telephone Encounter (Signed)
 Patients daughter returned called and verified rx is to be sent to [facility], rx was sent manually

## 2023-04-16 NOTE — Telephone Encounter (Signed)
 Left message on voicemail for patient to return call when available, reason for call is to confirm patient is in need of compound cream and verify pharmacy location

## 2023-04-30 ENCOUNTER — Ambulatory Visit: Payer: Medicare Other | Admitting: Obstetrics and Gynecology

## 2023-05-01 ENCOUNTER — Encounter: Payer: Self-pay | Admitting: Hematology

## 2023-05-01 ENCOUNTER — Other Ambulatory Visit: Payer: Self-pay | Admitting: *Deleted

## 2023-05-01 ENCOUNTER — Encounter: Payer: Self-pay | Admitting: Physical Medicine and Rehabilitation

## 2023-05-01 ENCOUNTER — Encounter
Payer: Medicare Other | Attending: Physical Medicine and Rehabilitation | Admitting: Physical Medicine and Rehabilitation

## 2023-05-01 VITALS — BP 113/77 | HR 74 | Ht <= 58 in | Wt 114.0 lb

## 2023-05-01 DIAGNOSIS — R269 Unspecified abnormalities of gait and mobility: Secondary | ICD-10-CM | POA: Insufficient documentation

## 2023-05-01 DIAGNOSIS — G5703 Lesion of sciatic nerve, bilateral lower limbs: Secondary | ICD-10-CM | POA: Insufficient documentation

## 2023-05-01 DIAGNOSIS — M810 Age-related osteoporosis without current pathological fracture: Secondary | ICD-10-CM

## 2023-05-01 DIAGNOSIS — G894 Chronic pain syndrome: Secondary | ICD-10-CM | POA: Insufficient documentation

## 2023-05-01 MED ORDER — METAXALONE 800 MG PO TABS: 400.0000 mg | ORAL_TABLET | Freq: Three times a day (TID) | ORAL | 5 refills | Status: DC | PRN

## 2023-05-01 MED ORDER — PREGABALIN 25 MG PO CAPS: 25.0000 mg | ORAL_CAPSULE | Freq: Two times a day (BID) | ORAL | 5 refills | Status: DC

## 2023-05-01 MED ORDER — DENOSUMAB 60 MG/ML ~~LOC~~ SOSY
60.0000 mg | PREFILLED_SYRINGE | Freq: Once | SUBCUTANEOUS | Status: AC
  Administered 2023-05-30: 60 mg via SUBCUTANEOUS

## 2023-05-01 NOTE — Progress Notes (Signed)
Subjective:    Patient ID: [name], female    DOB: 25-Jun-1933, 89 y.o.   MRN: [id number]  HPI  Pt is an 89 yr old female with hx of breast CA and RUE lymphedema, as well as uterine prolapse, NICCM- as well as Chronic low back pain with B/L sciatica- also has memory issues on Aricept; hx of lumbar fusion at L4/5. Has a hx of Bipolar Type II depression. Is on Zoloft 200 mg daily, Trilpetal and Wellbutrin for mood.  Here for f/u on Lumbar radiculopathy    Trying to keep holding on.   Takes Skelaxin 400 mg  nightly for 5 days, then off for 2 days.  Thinks it helps.  Can feels it when misses those 2 days-   Still taking Lyrica 25 mg BID-  Duloxetine 60 mg daily-  Naltrexone 5 mg nightly-    Got pedal bike- started using it- finally get up and running this weekend-  wasn't hard to use-  started with 5 minutes at a time- - could feel some of the muscles tighter after used it- slightly painful-   Brain fogginess- per pt, wasn't having fogginess- per daughter, had some yesterday and was out of it yesterday-  stayed in bed until 1pm-  Scares her- doesn't know what's causing it.    Tried tennis ball- didn't tolerate at all- put in dryer, even, and didn't tolerate at all.  Buttock pain still bothering her.   R hip pain is also bothering her- but actually pointed that pain is gluteal area- but right at fold of buttock.  Nothing make this pain go away- no exercise or stretch.   Pain Inventory Average Pain 6 Pain Right Now 6 My pain is constant, dull, and aching  In the last 24 hours, has pain interfered with the following? General activity 0 Relation with others 0 Enjoyment of life 5 What TIME of day is your pain at its worst? morning  Sleep (in general) Fair  Pain is worse with: bending, sitting, inactivity, standing, and some activites Pain improves with: rest, pacing activities, medication, and exercise Relief from Meds: 6  Family History  Problem Relation Age of  Onset   Hypertension Mother    Arthritis Mother    Hypertension Father    Hypertension Brother    Hypertension Brother    Social History   Socioeconomic History   Marital status: Married    Spouse name: Not on file   Number of children: 1   Years of education: Masters   Highest education level: Master's degree (e.g., MA, MS, MEng, MEd, MSW, MBA)  Occupational History   Occupation: Retired  Tobacco Use   Smoking status: Never    Passive exposure: Never   Smokeless tobacco: Never  Vaping Use   Vaping status: Never Used  Substance and Sexual Activity   Alcohol use: No   Drug use: No   Sexual activity: Not Currently    Birth control/protection: Post-menopausal  Other Topics Concern   Not on file  Social History Narrative   Lives at home with husband.   Right-handed.      As of 07/28/2014   Diet: No special diet   Caffeine: yes, Chocolate, tea and sodas    Married: YES, 1970   House: Yes, 2 stories, 2-3 persons live in home   Pets: No   Current/Past profession: Nurse, mental health, Nurse Practitioner    Exercise: Yes 2-3 x weekly   Living Will: Yes   DNR:  No   POA/HPOA: No      Social Drivers of Corporate investment banker Strain: Low Risk  (02/08/2023)   Overall Financial Resource Strain (CARDIA)    Difficulty of Paying Living Expenses: Not hard at all  Food Insecurity: No Food Insecurity (02/08/2023)   Hunger Vital Sign    Worried About Running Out of Food in the Last Year: Never true    Ran Out of Food in the Last Year: Never true  Transportation Needs: No Transportation Needs (02/08/2023)   PRAPARE - Administrator, Civil Service (Medical): No    Lack of Transportation (Non-Medical): No  Physical Activity: Unknown (02/08/2023)   Exercise Vital Sign    Days of Exercise per Week: 0 days    Minutes of Exercise per Session: Not on file  Stress: No Stress Concern Present (02/08/2023)   [employer] of Occupational Health - Occupational Stress  Questionnaire    Feeling of Stress : Only a little  Social Connections: Moderately Integrated (02/08/2023)   Social Connection and Isolation Panel [NHANES]    Frequency of Communication with Friends and Family: Three times a week    Frequency of Social Gatherings with Friends and Family: Patient declined    Attends Religious Services: More than 4 times per year    Active Member of [employer] or Organizations: Yes    Attends Banker Meetings: More than 4 times per year    Marital Status: Widowed   Past Surgical History:  Procedure Laterality Date   BACK SURGERY     lumbar fusion    BIV ICD GENERATOR CHANGEOUT N/A 02/06/2023   Procedure: BIV ICD GENERATOR CHANGEOUT;  Surgeon: Marinus Maw, MD;  Location: [facility] INVASIVE CV LAB;  Service: Cardiovascular;  Laterality: N/A;   BREAST LUMPECTOMY Left 2008   BREAST SURGERY  2000   LUMP REMOVAL. STAGE 1 CANCER   CARDIAC CATHETERIZATION     CATARACT EXTRACTION     COLONOSCOPY     EYE SURGERY     IMPLANTABLE CARDIOVERTER DEFIBRILLATOR GENERATOR CHANGE N/A 12/18/2012   Procedure: IMPLANTABLE CARDIOVERTER DEFIBRILLATOR GENERATOR CHANGE;  Surgeon: Marinus Maw, MD;  Location: [facility] CATH LAB;  Service: Cardiovascular;  Laterality: N/A;   JOINT REPLACEMENT  06/14/01   right   LUMBAR FUSION  2006   MASS EXCISION  11/08/2011   Procedure: EXCISION MASS;  Surgeon: Almond Lint, MD;  Location: [facility];  Service: General;  Laterality: Left;  Excision Left Thigh Mass   MASTECTOMY W/ SENTINEL NODE BIOPSY Left 06/04/2019   Procedure: LEFT MASTECTOMY WITH SENTINEL LYMPH NODE BIOPSY;  Surgeon: Almond Lint, MD;  Location: [facility] OR;  Service: General;  Laterality: Left;   MASTECTOMY, PARTIAL  2008   GOT PACEMAKER AND DEFIB AT THAT TIME   PACEMAKER INSERTION  04/23/06   TOTAL KNEE ARTHROPLASTY  05/17/01   RIGHT KNEE   TOTAL KNEE ARTHROPLASTY Left 11/29/2014   Procedure: TOTAL LEFT KNEE ARTHROPLASTY;  Surgeon: Durene Romans, MD;  Location: [facility];  Service:  Orthopedics;  Laterality: Left;   Past Surgical History:  Procedure Laterality Date   BACK SURGERY     lumbar fusion    BIV ICD GENERATOR CHANGEOUT N/A 02/06/2023   Procedure: BIV ICD GENERATOR CHANGEOUT;  Surgeon: Marinus Maw, MD;  Location: [facility] INVASIVE CV LAB;  Service: Cardiovascular;  Laterality: N/A;   BREAST LUMPECTOMY Left 2008   BREAST SURGERY  2000   LUMP REMOVAL. STAGE 1 CANCER   CARDIAC CATHETERIZATION  CATARACT EXTRACTION     COLONOSCOPY     EYE SURGERY     IMPLANTABLE CARDIOVERTER DEFIBRILLATOR GENERATOR CHANGE N/A 12/18/2012   Procedure: IMPLANTABLE CARDIOVERTER DEFIBRILLATOR GENERATOR CHANGE;  Surgeon: Marinus Maw, MD;  Location: [facility] CATH LAB;  Service: Cardiovascular;  Laterality: N/A;   JOINT REPLACEMENT  06/14/01   right   LUMBAR FUSION  2006   MASS EXCISION  11/08/2011   Procedure: EXCISION MASS;  Surgeon: Almond Lint, MD;  Location: [facility];  Service: General;  Laterality: Left;  Excision Left Thigh Mass   MASTECTOMY W/ SENTINEL NODE BIOPSY Left 06/04/2019   Procedure: LEFT MASTECTOMY WITH SENTINEL LYMPH NODE BIOPSY;  Surgeon: Almond Lint, MD;  Location: [facility];  Service: General;  Laterality: Left;   MASTECTOMY, PARTIAL  2008   GOT PACEMAKER AND DEFIB AT THAT TIME   PACEMAKER INSERTION  04/23/06   TOTAL KNEE ARTHROPLASTY  05/17/01   RIGHT KNEE   TOTAL KNEE ARTHROPLASTY Left 11/29/2014   Procedure: TOTAL LEFT KNEE ARTHROPLASTY;  Surgeon: Durene Romans, MD;  Location: [facility];  Service: Orthopedics;  Laterality: Left;   Past Medical History:  Diagnosis Date   Arthritis    Cancer (HCC)    left breast cancer    Cataracts, bilateral    removed by surgery   CHF (congestive heart failure) (HCC)    PACEMAKER & DEFIB   Complication of anesthesia    hypotensive after back surgery in 2006   Depression    Dyslipidemia    Fainted 04/21/06   AT [facility]   GERD (gastroesophageal reflux disease)    Headache(784.0)    Hearing loss    bilateral hearing aids   HLD  (hyperlipidemia)    diet controlled    Hypertension    Hypothyroidism    ICD (implantable cardiac defibrillator) in place    pt has pacer/icd   ICD (implantable cardiac defibrillator), biventricular, in situ    LBBB (left bundle branch block)    Memory loss    Nonischemic cardiomyopathy (HCC)    Normal coronary arteries    s/p cardiac cath 2007   Pacemaker    ICD [facility]   Syncope    Systolic CHF [facility])    Vertigo    Wears glasses    BP 113/77   Pulse 74   Ht 4\' 10"  (1.473 m)   Wt 114 lb (51.7 kg)   LMP  (LMP Unknown)   SpO2 95%   BMI 23.83 kg/m   Opioid Risk Score:   Fall Risk Score:  `1  Depression screen [facility] 2/9     05/01/2023   11:31 AM 01/30/2023   10:38 AM 08/01/2022   10:51 AM 07/09/2022   10:41 AM 05/18/2022    3:02 PM 05/16/2022    2:29 PM 04/30/2022   11:05 AM  Depression screen PHQ 2/9  Decreased Interest 1 1 0 0 0 0 0  Down, Depressed, Hopeless 1 1 0 0 0 0 0  PHQ - 2 Score 2 2 0 0 0 0 0    Review of Systems  Musculoskeletal:  Positive for gait problem.       Pain: Back, back of legs, buttock, right hip  All other systems reviewed and are negative.      Objective:   Physical Exam  Awake, alert using small quad cane to walk; hunched over; accompanied by daughter, NAD TTP  B/L gluteal/posterior thigh pain Wearing masks       Assessment & Plan:  Pt is an 89 yr old female with hx of breast CA and RUE lymphedema, as well as uterine prolapse, NICCM- as well as Chronic low back pain with B/L sciatica- also has memory issues on Aricept; hx of lumbar fusion at L4/5. Has a hx of Bipolar Type II depression. Is on Zoloft 200 mg daily, Trilpetal and Wellbutrin for mood.  Here for f/u on Lumbar radiculopathy  Suggest changing Skelaxin 400 mg to missing 2 days/week, but not next to each other- so ex; miss Tuesday and Friday.     2.   The literature says 5/days /week is ideal for back pain-  start at 2-3 days/week and then increase- by 2 -3  minutes every 3rd usage. So goal is 20-25 minutes/day- 5 days eventually- if it takes 6 months to get there, so be it.    3. I expect you to be a little sore afterwards for a few weeks, but if pain spikes, stop for a few days.    4. Hard to concentrate when having severe pain. Some days are worse than others-     5.  Refer to Dr [name]- for piriformis vs gluteal fold steroid injections- under ultrasound.    6.  Con't Naltrexone 5 mg nightly- had 1 year refill.    7. Con't Skelaxin 40 mg- sent in refill to [facility].  8. Sent in refills of Lyrica 25 mg BID- 5 refills   9. Duloxetine is from psychiatry.   10. F/U in 4 months- f/u on chronic pain     I spent a total of  31  minutes on total care today- >50% coordination of care- due to referral to Sports medicine- and refills of meds- and education on Home exercise program.

## 2023-05-01 NOTE — Patient Instructions (Addendum)
  Pt is an 89 yr old female with hx of breast CA and RUE lymphedema, as well as uterine prolapse, NICCM- as well as Chronic low back pain with B/L sciatica- also has memory issues on Aricept; hx of lumbar fusion at L4/5. Has a hx of Bipolar Type II depression. Is on Zoloft 200 mg daily, Trilpetal and Wellbutrin for mood.  Here for f/u on Lumbar radiculopathy  Suggest changing Skelaxin 400 mg to missing 2 days/week, but not next to each other- so ex; miss Tuesday and Friday.     2.   The literature says 5/days /week is ideal for back pain-  start at 2-3 days/week and then increase- by 2 -3 minutes every 3rd usage. So goal is 20-25 minutes/day- 5 days eventually- if it takes 6 months to get there, so be it.    3. I expect you to be a little sore afterwards for a few weeks, but if pain spikes, stop for a few days.    4. Hard to concentrate when having severe pain. Some days are worse than others-     5.  Refer to Dr [name]- for piriformis vs gluteal fold steroid injections- under ultrasound. [phone]   6.  Con't Naltrexone 5 mg nightly- had 1 year refill.    7. Con't Skelaxin 40 mg- sent in refill to [facility].  8. Sent in refills of Lyrica 25 mg BID- 5 refills   9. Duloxetine is from psychiatry.   10. F/U in 4 months- f/u on chronic pain

## 2023-05-02 ENCOUNTER — Ambulatory Visit: Payer: Medicare Other

## 2023-05-06 NOTE — Progress Notes (Unsigned)
[name] D.[facility] [address] [place] [address] Phone: [phone]   Assessment and Plan:     There are no diagnoses linked to this encounter.  ***   Pertinent previous records reviewed include ***    Follow Up: ***     Subjective:   I, [name], am serving as a Neurosurgeon for Doctor Richardean Sale  Chief Complaint: piriformis pain   HPI:   05/07/2023 Patient is a 89 year old female with piriformis pain. Patient states   Relevant Historical Information: ***  Additional pertinent review of systems negative.   Current Outpatient Medications:    acetaminophen (TYLENOL) 500 MG tablet, Take 1,000 mg by mouth every morning., Disp: , Rfl:    acetaminophen (TYLENOL) 650 MG CR tablet, Take 650 mg by mouth at bedtime., Disp: , Rfl:    albuterol (VENTOLIN HFA) 108 (90 Base) MCG/ACT inhaler, Inhale 2 puffs into the lungs every 6 (six) hours as needed for wheezing or shortness of breath., Disp: 8 g, Rfl: 11   ascorbic acid (VITAMIN C) 500 MG tablet, Take 500 mg by mouth daily., Disp: , Rfl:    buPROPion (WELLBUTRIN XL) 150 MG 24 hr tablet, Take 1 tablet (150 mg total) by mouth daily., Disp: , Rfl:    carvedilol (COREG) 3.125 MG tablet, TAKE 1 TABLET BY MOUTH TWICE  DAILY, Disp: 180 tablet, Rfl: 3   Cholecalciferol (VITAMIN D3) 50 MCG (2000 UT) TABS, Take 2,000 Units by mouth daily. , Disp: , Rfl:    diclofenac Sodium (PENNSAID) 2 % SOLN, APPLY 2 PUMPS (40MG ) UP TO TWO PAINFUL AREAS TWICE A DAY, Disp: 224 g, Rfl: 0   DULoxetine (CYMBALTA) 60 MG capsule, Take 60 mg by mouth daily., Disp: , Rfl:    estradiol (ESTRACE) 0.1 MG/GM vaginal cream, Place 0.5 g vaginally 2 (two) times a week. Place 0.5g nightly for two weeks then twice a week after, Disp: 30 g, Rfl: 11   famotidine (PEPCID) 10 MG tablet, Take 10 mg by mouth daily as needed for heartburn or indigestion., Disp: , Rfl:    furosemide (LASIX) 20 MG tablet, Take 1 tablet (20  mg total) by mouth daily as needed., Disp: 90 tablet, Rfl: 3   levothyroxine (SYNTHROID) 88 MCG tablet, Take 1 tablet (88 mcg total) by mouth daily., Disp: 90 tablet, Rfl: 3   lidocaine (XYLOCAINE) 5 % ointment, Apply 1 Application topically as needed (up to 4x/day as needed)., Disp: 50 g, Rfl: 5   Lidocaine 4 % PTCH, Apply 1 patch topically every 12 (twelve) hours. Apply to back, Disp: , Rfl:    lidocaine-prilocaine (EMLA) cream, , Disp: , Rfl:    lubiprostone (AMITIZA) 8 MCG capsule, TAKE ONE CAPSULE BY MOUTH TWICE DAILY WITH A MEAL, Disp: 180 capsule, Rfl: 3   Magnesium 250 MG TABS, Take 250 mg by mouth daily., Disp: , Rfl:    metaxalone (SKELAXIN) 800 MG tablet, Take 0.5 tablets (400 mg total) by mouth 3 (three) times daily as needed for muscle spasms., Disp: 60 tablet, Rfl: 5   Misc Natural Products (PUMPKIN SEED OIL) CAPS, Take 1 capsule by mouth daily., Disp: 180 capsule, Rfl: 0   NALTREXONE HCL, PAIN, PO, Take 4 mg by mouth at bedtime., Disp: , Rfl:    NONFORMULARY OR COMPOUNDED ITEM, Apply 120 Tubes topically daily. Diclofenac/Cyclobenzaprine/lamotrigine/lidocaine/prilocaine (95284) 2%/2%/6%/5%/1.25% cream QTY: 120 GM SIG: (NEURO) APPLY 1-2 PUMPS (1-2 GMS) TO AFFECTED AREA (S) OR FOCAL POINTS 3 TO  4 TIMES DAILY. RUB IN FOR 2 MINUTES TO ACHIEVE MAX PENETRATION. WASH HANDS WELL., Disp: 1 each, Rfl: 2   Omega-3 Fatty Acids (FISH OIL) 1200 MG CAPS, Take 1,200 mg by mouth daily., Disp: , Rfl:    omeprazole (PRILOSEC) 40 MG capsule, TAKE 1 CAPSULE BY MOUTH DAILY, Disp: 90 capsule, Rfl: 3   Polyethyl Glycol-Propyl Glycol (SYSTANE) 0.4-0.3 % SOLN, Place 1 drop into both eyes at bedtime., Disp: , Rfl:    POLYETHYLENE GLYCOL 3350 PO, Take 1 Capful by mouth daily., Disp: , Rfl:    pregabalin (LYRICA) 25 MG capsule, Take 1 capsule (25 mg total) by mouth 2 (two) times daily., Disp: 60 capsule, Rfl: 5   senna-docusate (SENOKOT S) 8.6-50 MG tablet, Take 2 tablets by mouth daily., Disp: 60 tablet, Rfl: 5    spironolactone (ALDACTONE) 25 MG tablet, TAKE 1 TABLET BY MOUTH IN THE  MORNING, Disp: 90 tablet, Rfl: 3   telmisartan (MICARDIS) 20 MG tablet, Take 1 tablet (20 mg total) by mouth daily., Disp: 90 tablet, Rfl: 1   Trospium Chloride 60 MG CP24, Take 1 capsule (60 mg total) by mouth daily., Disp: 30 capsule, Rfl: 3   Turmeric (QC TUMERIC COMPLEX PO), Take 1,000 mg by mouth daily., Disp: , Rfl:    Vitamin E 400 units TABS, Take 400 Units by mouth daily. , Disp: , Rfl:   Current Facility-Administered Medications:    denosumab (PROLIA) injection 60 mg, 60 mg, Subcutaneous, Once, Janyth Contes, Janene Harvey, NP   Objective:     There were no vitals filed for this visit.    There is no height or weight on file to calculate BMI.    Physical Exam:    ***   Electronically signed by:  [name] D.[facility] 1:57 PM 05/06/23

## 2023-05-07 ENCOUNTER — Ambulatory Visit (INDEPENDENT_AMBULATORY_CARE_PROVIDER_SITE_OTHER): Payer: Medicare Other | Admitting: Sports Medicine

## 2023-05-07 ENCOUNTER — Ambulatory Visit (INDEPENDENT_AMBULATORY_CARE_PROVIDER_SITE_OTHER): Payer: Medicare Other

## 2023-05-07 VITALS — BP 120/80 | HR 73 | Ht <= 58 in | Wt 114.0 lb

## 2023-05-07 DIAGNOSIS — M5441 Lumbago with sciatica, right side: Secondary | ICD-10-CM

## 2023-05-07 DIAGNOSIS — M545 Low back pain, unspecified: Secondary | ICD-10-CM

## 2023-05-07 DIAGNOSIS — G8929 Other chronic pain: Secondary | ICD-10-CM

## 2023-05-07 DIAGNOSIS — R29898 Other symptoms and signs involving the musculoskeletal system: Secondary | ICD-10-CM | POA: Diagnosis not present

## 2023-05-07 DIAGNOSIS — Z981 Arthrodesis status: Secondary | ICD-10-CM

## 2023-05-07 NOTE — Patient Instructions (Signed)
Tylenol [phone] mg 2-3 times a day for pain relief  Lumbar MRI  Follow up 5 days after to discuss results

## 2023-05-08 ENCOUNTER — Encounter: Payer: Self-pay | Admitting: Internal Medicine

## 2023-05-08 ENCOUNTER — Ambulatory Visit (INDEPENDENT_AMBULATORY_CARE_PROVIDER_SITE_OTHER): Payer: Medicare Other

## 2023-05-08 DIAGNOSIS — I428 Other cardiomyopathies: Secondary | ICD-10-CM | POA: Diagnosis not present

## 2023-05-08 LAB — CUP PACEART REMOTE DEVICE CHECK
Battery Remaining Longevity: 126 mo
Battery Remaining Percentage: 100 %
Brady Statistic RA Percent Paced: 0 %
Brady Statistic RV Percent Paced: 100 %
Date Time Interrogation Session: 20250129024200
HighPow Impedance: 40 Ohm
Implantable Lead Connection Status: 753985
Implantable Lead Connection Status: 753985
Implantable Lead Connection Status: 753985
Implantable Lead Implant Date: 20080116
Implantable Lead Implant Date: 20080116
Implantable Lead Implant Date: 20080116
Implantable Lead Location: 753858
Implantable Lead Location: 753859
Implantable Lead Location: 753860
Implantable Lead Model: 157
Implantable Lead Model: 4469
Implantable Lead Model: 4555
Implantable Lead Serial Number: 136532
Implantable Lead Serial Number: 161542
Implantable Lead Serial Number: 473495
Implantable Pulse Generator Implant Date: 20241030
Lead Channel Impedance Value: 387 Ohm
Lead Channel Impedance Value: 470 Ohm
Lead Channel Impedance Value: 885 Ohm
Lead Channel Pacing Threshold Amplitude: 0.7 V
Lead Channel Pacing Threshold Amplitude: 1.2 V
Lead Channel Pacing Threshold Pulse Width: 0.4 ms
Lead Channel Pacing Threshold Pulse Width: 0.8 ms
Lead Channel Setting Pacing Amplitude: 2 V
Lead Channel Setting Pacing Amplitude: 2 V
Lead Channel Setting Pacing Amplitude: 2.2 V
Lead Channel Setting Pacing Pulse Width: 0.4 ms
Lead Channel Setting Pacing Pulse Width: 0.8 ms
Lead Channel Setting Sensing Sensitivity: 0.5 mV
Lead Channel Setting Sensing Sensitivity: 1 mV
Pulse Gen Serial Number: 157167

## 2023-05-09 NOTE — Addendum Note (Signed)
Addended by: Debbe Odea R on: 05/09/2023 02:00 PM   Modules accepted: Orders

## 2023-05-13 ENCOUNTER — Telehealth: Payer: Self-pay

## 2023-05-13 NOTE — Telephone Encounter (Signed)
Message was relayed to her daughters VM per the pts request

## 2023-05-13 NOTE — Telephone Encounter (Signed)
[name] is a 89 y.o. female who called in stating that she still has the white patches on the urethra/ vagina area. Pt said she would like to know if that would interfere with the urethral bulking procedure.  Pt said she would also like something for yeast infection after she takes the antibiotics. I told the patient you would send something for the yeast infection during her visit.

## 2023-05-14 ENCOUNTER — Ambulatory Visit: Payer: Medicare Other | Attending: Internal Medicine | Admitting: Internal Medicine

## 2023-05-14 ENCOUNTER — Encounter: Payer: Self-pay | Admitting: Internal Medicine

## 2023-05-14 VITALS — BP 124/68 | HR 71 | Ht <= 58 in | Wt 118.2 lb

## 2023-05-14 DIAGNOSIS — Z95810 Presence of automatic (implantable) cardiac defibrillator: Secondary | ICD-10-CM

## 2023-05-14 DIAGNOSIS — I5022 Chronic systolic (congestive) heart failure: Secondary | ICD-10-CM

## 2023-05-14 LAB — CUP PACEART INCLINIC DEVICE CHECK
Date Time Interrogation Session: 20250204105951
HighPow Impedance: 40 Ohm
Implantable Lead Connection Status: 753985
Implantable Lead Connection Status: 753985
Implantable Lead Connection Status: 753985
Implantable Lead Implant Date: 20080116
Implantable Lead Implant Date: 20080116
Implantable Lead Implant Date: 20080116
Implantable Lead Location: 753858
Implantable Lead Location: 753859
Implantable Lead Location: 753860
Implantable Lead Model: 157
Implantable Lead Model: 4469
Implantable Lead Model: 4555
Implantable Lead Serial Number: 136532
Implantable Lead Serial Number: 161542
Implantable Lead Serial Number: 473495
Implantable Pulse Generator Implant Date: 20241030
Lead Channel Impedance Value: 382 Ohm
Lead Channel Impedance Value: 480 Ohm
Lead Channel Impedance Value: 859 Ohm
Lead Channel Pacing Threshold Amplitude: 0.6 V
Lead Channel Pacing Threshold Amplitude: 0.7 V
Lead Channel Pacing Threshold Amplitude: 1.1 V
Lead Channel Pacing Threshold Pulse Width: 0.4 ms
Lead Channel Pacing Threshold Pulse Width: 0.4 ms
Lead Channel Pacing Threshold Pulse Width: 0.8 ms
Lead Channel Sensing Intrinsic Amplitude: 15 mV
Lead Channel Sensing Intrinsic Amplitude: 25 mV
Lead Channel Sensing Intrinsic Amplitude: 5.7 mV
Lead Channel Setting Pacing Amplitude: 2 V
Lead Channel Setting Pacing Amplitude: 2 V
Lead Channel Setting Pacing Amplitude: 2.2 V
Lead Channel Setting Pacing Pulse Width: 0.4 ms
Lead Channel Setting Pacing Pulse Width: 0.8 ms
Lead Channel Setting Sensing Sensitivity: 0.5 mV
Lead Channel Setting Sensing Sensitivity: 1 mV
Pulse Gen Serial Number: 157167

## 2023-05-14 NOTE — Progress Notes (Signed)
 HPI Ms. Reifschneider returns today for followup. She is a pleasant 89 yo woman with a h/o chronic systolic heart failure, LBBB, s/p biv ICD insertion. She notes some weight loss and dyspnea though she has become more sedentary. She does ok on flat ground. No ICD shocks. She has been diagnosed with breast CA and undergone mastectomy. She has done well so far after breast surgery. She is still a little sore. Her weight is around 114-115.  Allergies  Allergen Reactions   Iodine  Shortness Of Breath    Iodine  contrast, CHF , SOB   Shellfish Allergy Shortness Of Breath   Memantine      Malaise, fogginess/ couldn't think   Aspirin  Nausea And Vomiting   Codeine Nausea And Vomiting and Other (See Comments)    Other Reaction(s): Not available     Current Outpatient Medications  Medication Sig Dispense Refill   albuterol  (VENTOLIN  HFA) 108 (90 Base) MCG/ACT inhaler Inhale 2 puffs into the lungs every 6 (six) hours as needed for wheezing or shortness of breath. 8 g 11   ascorbic acid (VITAMIN C) 500 MG tablet Take 500 mg by mouth daily.     buPROPion  (WELLBUTRIN  XL) 150 MG 24 hr tablet Take 1 tablet (150 mg total) by mouth daily.     carvedilol  (COREG ) 3.125 MG tablet TAKE 1 TABLET BY MOUTH TWICE  DAILY 180 tablet 3   Cholecalciferol (VITAMIN D3) 50 MCG (2000 UT) TABS Take 2,000 Units by mouth daily.      diclofenac  Sodium (PENNSAID ) 2 % SOLN APPLY 2 PUMPS (40MG ) UP TO TWO PAINFUL AREAS TWICE A DAY 224 g 0   DULoxetine (CYMBALTA) 60 MG capsule Take 60 mg by mouth daily.     estradiol  (ESTRACE ) 0.1 MG/GM vaginal cream Place 0.5 g vaginally 2 (two) times a week. Place 0.5g nightly for two weeks then twice a week after 30 g 11   famotidine (PEPCID) 10 MG tablet Take 10 mg by mouth daily as needed for heartburn or indigestion.     furosemide  (LASIX ) 20 MG tablet Take 1 tablet (20 mg total) by mouth daily as needed. 90 tablet 3   levothyroxine  (SYNTHROID ) 88 MCG tablet Take 1 tablet (88 mcg total) by  mouth daily. 90 tablet 3   lidocaine  (XYLOCAINE ) 5 % ointment Apply 1 Application topically as needed (up to 4x/day as needed). 50 g 5   Lidocaine  4 % PTCH Apply 1 patch topically every 12 (twelve) hours. Apply to back     lidocaine -prilocaine  (EMLA ) cream      lubiprostone  (AMITIZA ) 8 MCG capsule TAKE ONE CAPSULE BY MOUTH TWICE DAILY WITH A MEAL 180 capsule 3   Magnesium  250 MG TABS Take 250 mg by mouth daily.     metaxalone  (SKELAXIN ) 800 MG tablet Take 0.5 tablets (400 mg total) by mouth 3 (three) times daily as needed for muscle spasms. 60 tablet 5   Misc Natural Products (PUMPKIN SEED  OIL) CAPS Take 1 capsule by mouth daily. 180 capsule 0   NALTREXONE  HCL, PAIN, PO Take 4 mg by mouth at bedtime.     NONFORMULARY OR COMPOUNDED ITEM Apply 120 Tubes topically daily. Diclofenac /Cyclobenzaprine/lamotrigine/lidocaine /prilocaine  (10480) 2%/2%/6%/5%/1.25% cream QTY: 120 GM SIG: (NEURO) APPLY 1-2 PUMPS (1-2 GMS) TO AFFECTED AREA (S) OR FOCAL POINTS 3 TO 4 TIMES DAILY. RUB IN FOR 2 MINUTES TO ACHIEVE MAX PENETRATION. WASH HANDS WELL. 1 each 2   Omega-3 Fatty Acids (FISH OIL) 1200 MG CAPS Take 1,200 mg by mouth  daily.     omeprazole  (PRILOSEC) 40 MG capsule TAKE 1 CAPSULE BY MOUTH DAILY 90 capsule 3   Polyethyl Glycol-Propyl Glycol (SYSTANE) 0.4-0.3 % SOLN Place 1 drop into both eyes at bedtime.     POLYETHYLENE GLYCOL 3350  PO Take 1 Capful by mouth daily.     pregabalin  (LYRICA ) 25 MG capsule Take 1 capsule (25 mg total) by mouth 2 (two) times daily. 60 capsule 5   senna-docusate (SENOKOT S) 8.6-50 MG tablet Take 2 tablets by mouth daily. 60 tablet 5   spironolactone  (ALDACTONE ) 25 MG tablet TAKE 1 TABLET BY MOUTH IN THE  MORNING 90 tablet 3   telmisartan  (MICARDIS ) 20 MG tablet Take 1 tablet (20 mg total) by mouth daily. 90 tablet 1   Trospium  Chloride 60 MG CP24 Take 1 capsule (60 mg total) by mouth daily. 30 capsule 3   Turmeric (QC TUMERIC COMPLEX PO) Take 1,000 mg by mouth daily.     Vitamin E  400 units TABS Take 400 Units by mouth daily.      Current Facility-Administered Medications  Medication Dose Route Frequency Provider Last Rate Last Admin   denosumab  (PROLIA ) injection 60 mg  60 mg Subcutaneous Once [name], NP         Past Medical History:  Diagnosis Date   Arthritis    Cancer (HCC)    left breast cancer    Cataracts, bilateral    removed by surgery   CHF (congestive heart failure) (HCC)    PACEMAKER & DEFIB   Complication of anesthesia    hypotensive after back surgery in 2006   Depression    Dyslipidemia    Fainted 04/21/06   AT [facility]   GERD (gastroesophageal reflux disease)    Headache(784.0)    Hearing loss    bilateral hearing aids   HLD (hyperlipidemia)    diet controlled    Hypertension    Hypothyroidism    ICD (implantable cardiac defibrillator) in place    pt has pacer/icd   ICD (implantable cardiac defibrillator), biventricular, in situ    LBBB (left bundle branch block)    Memory loss    Nonischemic cardiomyopathy (HCC)    Normal coronary arteries    s/p cardiac cath 2007   Pacemaker    ICD [employer]   Syncope    Systolic CHF [facility])    Vertigo    Wears glasses     ROS:   All systems reviewed and negative except as noted in the HPI.   Past Surgical History:  Procedure Laterality Date   BACK SURGERY     lumbar fusion    BIV ICD GENERATOR CHANGEOUT N/A 02/06/2023   Procedure: BIV ICD GENERATOR CHANGEOUT;  Surgeon: [name], MD;  Location: [facility] INVASIVE CV LAB;  Service: Cardiovascular;  Laterality: N/A;   BREAST LUMPECTOMY Left 2008   BREAST SURGERY  2000   LUMP REMOVAL. STAGE 1 CANCER   CARDIAC CATHETERIZATION     CATARACT EXTRACTION     COLONOSCOPY     EYE SURGERY     IMPLANTABLE CARDIOVERTER DEFIBRILLATOR GENERATOR CHANGE N/A 12/18/2012   Procedure: IMPLANTABLE CARDIOVERTER DEFIBRILLATOR GENERATOR CHANGE;  Surgeon: Danelle ORN Waddell, MD;  Location: [facility] CATH LAB;  Service: Cardiovascular;  Laterality:  N/A;   JOINT REPLACEMENT  06/14/01   right   LUMBAR FUSION  2006   MASS EXCISION  11/08/2011   Procedure: EXCISION MASS;  Surgeon: Jina Nephew, MD;  Location: [facility] ORS;  Service: General;  Laterality: Left;  Excision Left Thigh Mass   MASTECTOMY W/ SENTINEL NODE BIOPSY Left 06/04/2019   Procedure: LEFT MASTECTOMY WITH SENTINEL LYMPH NODE BIOPSY;  Surgeon: [name], MD;  Location: [facility] OR;  Service: General;  Laterality: Left;   MASTECTOMY, PARTIAL  2008   GOT PACEMAKER AND DEFIB AT THAT TIME   PACEMAKER INSERTION  04/23/06   TOTAL KNEE ARTHROPLASTY  05/17/01   RIGHT KNEE   TOTAL KNEE ARTHROPLASTY Left 11/29/2014   Procedure: TOTAL LEFT KNEE ARTHROPLASTY;  Surgeon: Donnice Car, MD;  Location: [facility] ORS;  Service: Orthopedics;  Laterality: Left;     Family History  Problem Relation Age of Onset   Hypertension Mother    Arthritis Mother    Hypertension Father    Hypertension Brother    Hypertension Brother      Social History   Socioeconomic History   Marital status: Married    Spouse name: Not on file   Number of children: 1   Years of education: Masters   Highest education level: Master's degree (e.g., MA, MS, MEng, MEd, MSW, MBA)  Occupational History   Occupation: Retired  Tobacco Use   Smoking status: Never    Passive exposure: Never   Smokeless tobacco: Never  Vaping Use   Vaping status: Never Used  Substance and Sexual Activity   Alcohol use: No   Drug use: No   Sexual activity: Not Currently    Birth control/protection: Post-menopausal  Other Topics Concern   Not on file  Social History Narrative   Lives at home with husband.   Right-handed.      As of 07/28/2014   Diet: No special diet   Caffeine: yes, Chocolate, tea and sodas    Married: YES, 1970   House: Yes, 2 stories, 2-3 persons live in home   Pets: No   Current/Past profession: Nurse, Mental Health, Publishing Rights Manager    Exercise: Yes 2-3 x weekly   Living Will: Yes   DNR: No   POA/HPOA: No      Social  Drivers of Corporate Investment Banker Strain: Low Risk  (02/08/2023)   Overall Financial Resource Strain (CARDIA)    Difficulty of Paying Living Expenses: Not hard at all  Food Insecurity: No Food Insecurity (02/08/2023)   Hunger Vital Sign    Worried About Running Out of Food in the Last Year: Never true    Ran Out of Food in the Last Year: Never true  Transportation Needs: No Transportation Needs (02/08/2023)   PRAPARE - Administrator, Civil Service (Medical): No    Lack of Transportation (Non-Medical): No  Physical Activity: Unknown (02/08/2023)   Exercise Vital Sign    Days of Exercise per Week: 0 days    Minutes of Exercise per Session: Not on file  Stress: No Stress Concern Present (02/08/2023)   [employer] of Occupational Health - Occupational Stress Questionnaire    Feeling of Stress : Only a little  Social Connections: Moderately Integrated (02/08/2023)   Social Connection and Isolation Panel [NHANES]    Frequency of Communication with Friends and Family: Three times a week    Frequency of Social Gatherings with Friends and Family: Patient declined    Attends Religious Services: More than 4 times per year    Active Member of [employer] or Organizations: Yes    Attends Banker Meetings: More than 4 times per year    Marital Status: Widowed  Intimate Partner Violence: Not At Risk (  03/27/2017)   Humiliation, Afraid, Rape, and Kick questionnaire    Fear of Current or Ex-Partner: No    Emotionally Abused: No    Physically Abused: No    Sexually Abused: No     BP 124/68   Pulse 71   Ht 4' 10 (1.473 m)   Wt 118 lb 3.2 oz (53.6 kg)   LMP  (LMP Unknown)   SpO2 98%   BMI 24.70 kg/m   Physical Exam:  Well appearing NAD HEENT: Unremarkable Neck:  No JVD, no thyromegally Lymphatics:  No adenopathy Back:  No CVA tenderness Lungs:  Clear HEART:  Regular rate rhythm, no murmurs, no rubs, no clicks Abd:  soft, positive bowel sounds, no  organomegally, no rebound, no guarding Ext:  2 plus pulses, no edema, no cyanosis, no clubbing Skin:  No rashes no nodules Neuro:  CN II through XII intact, motor grossly intact  EKG - P synch ventricular pacing  DEVICE  Normal device function.  See PaceArt for details.   Assess/Plan:  Chronic systolic heart failure - her symptoms remain class 2. She will continue her current meds. 2. ICD - her boston Sci DDD biv ICD is working normally. She has reached ERI. She will undergo gen change out. 3. HTN - her bp is controlled. No change in meds. 4. Weight loss - her weight is up since her last visit.  Danelle Saniya Tranchina,MD

## 2023-05-14 NOTE — Patient Instructions (Signed)
 Medication Instructions:  Your physician recommends that you continue on your current medications as directed. Please refer to the Current Medication list given to you today.  *If you need a refill on your cardiac medications before your next appointment, please call your pharmacy*  Lab Work: None ordered.  If you have labs (blood work) drawn today and your tests are completely normal, you will receive your results only by: MyChart Message (if you have MyChart) OR A paper copy in the mail If you have any lab test that is abnormal or we need to change your treatment, we will call you to review the results.  Testing/Procedures: None ordered.  Follow-Up: At [facility], you and your health needs are our priority.  As part of our continuing mission to provide you with exceptional heart care, we have created designated Provider Care Teams.  These Care Teams include your primary Cardiologist (physician) and Advanced Practice Providers (APPs -  Physician Assistants and Nurse Practitioners) who all work together to provide you with the care you need, when you need it.   Your next appointment:   1 year(s)  The format for your next appointment:   In Person  Provider:   Lewayne Bunting, MD{or one of the following Advanced Practice Providers on your designated Care Team:   Francis Dowse, [place] Casimiro Needle "Mardelle Matte" [place], [place] Earnest Rosier, NP  Remote monitoring is used to monitor your Pacemaker/ ICD from home. This monitoring reduces the number of office visits required to check your device to one time per year. It allows [place] to keep an eye on the functioning of your device to ensure it is working properly.   Important Information About Sugar

## 2023-05-16 ENCOUNTER — Other Ambulatory Visit (HOSPITAL_COMMUNITY)
Admission: RE | Admit: 2023-05-16 | Discharge: 2023-05-16 | Disposition: A | Discharge status: Home / Self Care | Payer: Medicare Other | Source: Ambulatory Visit | Attending: Obstetrics and Gynecology | Admitting: Obstetrics and Gynecology

## 2023-05-16 ENCOUNTER — Ambulatory Visit: Payer: Medicare Other | Admitting: Obstetrics and Gynecology

## 2023-05-16 ENCOUNTER — Encounter: Payer: Self-pay | Admitting: Sports Medicine

## 2023-05-16 ENCOUNTER — Encounter: Payer: Self-pay | Admitting: Obstetrics and Gynecology

## 2023-05-16 VITALS — BP 130/82 | HR 80

## 2023-05-16 DIAGNOSIS — R350 Frequency of micturition: Secondary | ICD-10-CM

## 2023-05-16 DIAGNOSIS — R82998 Other abnormal findings in urine: Secondary | ICD-10-CM

## 2023-05-16 DIAGNOSIS — A609 Anogenital herpesviral infection, unspecified: Secondary | ICD-10-CM

## 2023-05-16 DIAGNOSIS — N9089 Other specified noninflammatory disorders of vulva and perineum: Secondary | ICD-10-CM

## 2023-05-16 DIAGNOSIS — N393 Stress incontinence (female) (male): Secondary | ICD-10-CM | POA: Diagnosis not present

## 2023-05-16 DIAGNOSIS — N9489 Other specified conditions associated with female genital organs and menstrual cycle: Secondary | ICD-10-CM

## 2023-05-16 LAB — POCT URINALYSIS DIPSTICK
Bilirubin, UA: NEGATIVE
Blood, UA: NEGATIVE
Glucose, UA: NEGATIVE
Ketones, UA: NEGATIVE
Nitrite, UA: NEGATIVE
Protein, UA: NEGATIVE
Spec Grav, UA: 1.015 (ref 1.010–1.025)
Urobilinogen, UA: 0.2 U/dL
pH, UA: 7 (ref 5.0–8.0)

## 2023-05-16 MED ORDER — FLUCONAZOLE 150 MG PO TABS: 150.0000 mg | ORAL_TABLET | Freq: Once | ORAL | 0 refills | Status: AC

## 2023-05-16 MED ORDER — LIDOCAINE-EPINEPHRINE 1 %-1:100000 IJ SOLN
10.0000 mL | Freq: Once | INTRAMUSCULAR | Status: AC
  Administered 2023-05-16: 10 mL

## 2023-05-16 MED ORDER — LIDOCAINE-PRILOCAINE 2.5-2.5 % EX CREA: 1.0000 | TOPICAL_CREAM | CUTANEOUS | 0 refills | Status: DC | PRN

## 2023-05-16 MED ORDER — LIDOCAINE HCL URETHRAL/MUCOSAL 2 % EX GEL
1.0000 | Freq: Once | CUTANEOUS | Status: AC
  Administered 2023-05-16: 1 via URETHRAL

## 2023-05-16 NOTE — Progress Notes (Signed)
 [name]  SUBJECTIVE  History of Present Illness: [name] is a 89 y.o. female seen in follow-up for mixed incontinence, prolapse and clitoral lesions.   Last visit, white lesions were noted over the clitoral hood. She used estrace  cream twice a week and A&D ointment as needed. Still has a little burning with wiping. Has been taking bactrim  in preparation for urethral bulking today.   Had one prior urethral bulking injection and noted temporary improvement in leakage. She is currently taking trospium  for OAB symptoms (has tried myrbetriq , gemtesa  and vesicare as well). She still has daily symptoms of leakage. Has a pacemaker and not eligible for PTNS. She is not interested in SNM and botox  from prior conversations. She decided she wants to try a top up of the urethral bulking.   Has #3 ring with support pessary, last cleaned in December.   Past Medical History: Patient  has a past medical history of Arthritis, Cancer (HCC), Cataracts, bilateral, CHF (congestive heart failure) (HCC), Complication of anesthesia, Depression, Dyslipidemia, Fainted (04/21/06), GERD (gastroesophageal reflux disease), Headache(784.0), Hearing loss, HLD (hyperlipidemia), Hypertension, Hypothyroidism, ICD (implantable cardiac defibrillator) in place, ICD (implantable cardiac defibrillator), biventricular, in situ, LBBB (left bundle branch block), Memory loss, Nonischemic cardiomyopathy (HCC), Normal coronary arteries, Pacemaker, Syncope, Systolic CHF (HCC), Vertigo, and Wears glasses.   Past Surgical History: She  has a past surgical history that includes Total knee arthroplasty (05/17/01); Lumbar fusion (2006); Mastectomy, partial (2008); Breast surgery (2000); Joint replacement (06/14/01); Pacemaker insertion (04/23/06); Back surgery; Cardiac catheterization; Mass excision (11/08/2011); implantable cardioverter defibrillator generator change (N/A, 12/18/2012); Cataract extraction; Eye  surgery; Total knee arthroplasty (Left, 11/29/2014); Breast lumpectomy (Left, 2008); Colonoscopy; Mastectomy w/ sentinel node biopsy (Left, 06/04/2019); and BIV ICD GENERATOR CHANGEOUT (N/A, 02/06/2023).   Medications: She has a current medication list which includes the following prescription(s): albuterol , ascorbic acid, bupropion , carvedilol , vitamin d3, diclofenac  sodium, duloxetine, estradiol , famotidine, fluconazole , furosemide , levothyroxine , lidocaine , lidocaine , lidocaine -prilocaine , lidocaine -prilocaine , lubiprostone , magnesium , metaxalone , pumpkin seed  oil, naltrexone  hcl (pain), NONFORMULARY OR COMPOUNDED ITEM, fish oil, omeprazole , systane, polyethylene glycol 3350 , pregabalin , senna-docusate, spironolactone , telmisartan , trospium  chloride, turmeric, and vitamin e, and the following Facility-Administered Medications: denosumab , lidocaine , and lidocaine -epinephrine .   Allergies: Patient is allergic to iodine , shellfish allergy, memantine , aspirin , and codeine.   Social History: Patient  reports that she has never smoked. She has never been exposed to tobacco smoke. She has never used smokeless tobacco. She reports that she does not drink alcohol and does not use drugs.     OBJECTIVE     Physical Exam: Vitals:   05/16/23 0909  BP: 130/82  Pulse: 80   Gen: No apparent distress, A&O x 3.  Bulkamid Injection  Procedure: Time out was performed. The bladder was catheterized and 10 ml of 2% lidocaine  jelly placed in the urethra. A urethral block was performed by injecting 3ml of 1% lidocaine  with epinephrine  at 3 and 9 o'clock adjacent to the urethra.  The needle was primed.  The cystoscope was inserted to the level of the bladder neck.  The needle was inserted 2 cm and the scope was pulled back into the urethra 2 cm.  The needle was inserted bevel up at the 5 o'clock position and the Bulkamid was injected to obtain coaptation.  This was repeated at the 2 o'clock,  10 o'clock and 7  o'clock positions.   A total of 2- 1ml syringes were used and good circumferential coaptation was noted.  The patient tolerated the procedure well.  She was asked to void after the procedure.  Post-Void Residual (PVR) by Bladder Scan: In order to evaluate bladder emptying, we discussed obtaining a postvoid residual and she agreed to this procedure.  Procedure: The ultrasound unit was placed on the patient's abdomen in the suprapubic region after the patient had voided. A PVR of 180 ml was obtained by bladder scan.   Vulvar biopsy White, spotty lesions on clitoral hood. Hurricaine jelly was placed over the clitoris. After several minutes, this was wiped away and prepped with betadine . A white lesion on the right lower clitoral hood was chosen and 2cc of 1% lidocaine  with epinephrine  was injected. A 3mm punch biopsy was obtained. The area was closed with an interrupted 3-0 vicryl suture  and hemostasis was noted. A viral swab for hsv was obtained of the upper lesion although unable to break through the skin and obtain any fluid.      ASSESSMENT AND PLAN    [name] is a 89 y.o. with:  1. Leukocytes in urine   2. Urinary frequency   3. SUI (stress urinary incontinence, female)   4. Vulvar lesion   5. Vaginal burning     - Urine culture sent due to leukocytes - 46F catheter replaced today due to urinary retention after the urethral bulking procedure. She will return tomorrow for a voiding trial.  - Path specimen and viral culture sent for clitoral lesion.  - Rx for diflucan  sent to pharmacy since she has frequent yeast infections after antibiotics.  - Also sent emla  cream to use over the biopsy area in case of discomfort.  - pessary left in place, will plan to clean at next visit.  - Will reassess OAB symptoms next visit as well, although we dicussed today that there are not other medications that we would try.   Return 1 month  [name], MD

## 2023-05-16 NOTE — Progress Notes (Signed)
 [name] is a 89 y.o. female  unable to void after procedure.  12 fr cath removed with no complications. Pt shown how to change from leg drainage bag to overnight drainage bag.  Pt was scheduled for a nurse visit appt on 05/17/2023  PVR: 180 Bladder drained with 26fr cath: 120

## 2023-05-17 ENCOUNTER — Ambulatory Visit (INDEPENDENT_AMBULATORY_CARE_PROVIDER_SITE_OTHER): Payer: Medicare Other

## 2023-05-17 ENCOUNTER — Encounter: Payer: Self-pay | Admitting: Obstetrics and Gynecology

## 2023-05-17 DIAGNOSIS — N399 Disorder of urinary system, unspecified: Secondary | ICD-10-CM

## 2023-05-17 LAB — URINE CULTURE: Culture: NO GROWTH

## 2023-05-17 LAB — SURGICAL PATHOLOGY

## 2023-05-17 MED ORDER — CLOBETASOL PROPIONATE E 0.05 % EX CREA: 0.5000 g | TOPICAL_CREAM | Freq: Every evening | CUTANEOUS | 11 refills | Status: AC

## 2023-05-17 NOTE — Addendum Note (Signed)
 Addended by: Arma Lamp on: 05/17/2023 01:44 PM   Modules accepted: Orders

## 2023-05-17 NOTE — Progress Notes (Signed)
 [name] is a 89 y.o. female here for a voiding trial. Pt leaked as she was walking to the restroom. 12 fr cath removed with no complications.  Instilled: 200 Voided: 150 PVR: 8

## 2023-05-17 NOTE — Patient Instructions (Signed)
Please keep all future appointments.

## 2023-05-19 LAB — HERPES SIMPLEX VIRUS CULTURE

## 2023-05-20 ENCOUNTER — Other Ambulatory Visit: Payer: Self-pay | Admitting: Sports Medicine

## 2023-05-20 DIAGNOSIS — Z888 Allergy status to other drugs, medicaments and biological substances status: Secondary | ICD-10-CM

## 2023-05-20 MED ORDER — PREDNISONE 50 MG PO TABS
ORAL_TABLET | ORAL | 0 refills | Status: DC
Start: 2023-05-20 — End: 2023-06-25

## 2023-05-20 MED ORDER — DIPHENHYDRAMINE HCL 50 MG PO TABS: ORAL_TABLET | ORAL | 0 refills | Status: DC

## 2023-05-20 MED ORDER — VALACYCLOVIR HCL 1 G PO TABS: 1000.0000 mg | ORAL_TABLET | Freq: Two times a day (BID) | ORAL | 0 refills | Status: AC

## 2023-05-20 NOTE — Progress Notes (Signed)
 Due to past medical history of contrast allergy, I recommend three doses of 50 mg oral prednisone  administered 13, 7, and 1 hour prior to contrast administration; and 50 mg oral diphenhydramine  administered one hour prior to contrast administration.  Prescription sent to pharmacy today.

## 2023-05-20 NOTE — Addendum Note (Signed)
 Addended by: Arma Lamp on: 05/20/2023 09:18 AM   Modules accepted: Orders

## 2023-05-27 ENCOUNTER — Ambulatory Visit (INDEPENDENT_AMBULATORY_CARE_PROVIDER_SITE_OTHER): Payer: Medicare Other | Admitting: Nurse Practitioner

## 2023-05-27 ENCOUNTER — Encounter: Payer: Self-pay | Admitting: Nurse Practitioner

## 2023-05-27 VITALS — BP 110/68 | HR 75 | Temp 97.9°F | Resp 17 | Ht <= 58 in | Wt 119.6 lb

## 2023-05-27 DIAGNOSIS — Z0000 Encounter for general adult medical examination without abnormal findings: Secondary | ICD-10-CM | POA: Diagnosis not present

## 2023-05-27 DIAGNOSIS — E039 Hypothyroidism, unspecified: Secondary | ICD-10-CM

## 2023-05-27 DIAGNOSIS — E2839 Other primary ovarian failure: Secondary | ICD-10-CM | POA: Diagnosis not present

## 2023-05-27 NOTE — Progress Notes (Signed)
Subjective:   [name] is a 89 y.o. female who presents for Medicare Annual (Subsequent) preventive examination.  Visit Complete: In person  Cardiac Risk Factors include: advanced age (>65men, >29 women);sedentary lifestyle;hypertension     Objective:    Today's Vitals   05/27/23 1044 05/27/23 1047 05/27/23 1100  BP: 110/68 110/68   Pulse: 75 75   Resp: 17 17   Temp: 97.9 F (36.6 C) 97.9 F (36.6 C)   TempSrc: Temporal Temporal   SpO2: 98% 98%   Weight: 119 lb 9.6 oz (54.3 kg) 119 lb 9.6 oz (54.3 kg)   Height: 4\' 10"  (1.473 m) 4\' 10"  (1.473 m)   PainSc:   6    Body mass index is 25 kg/m.     05/27/2023   10:44 AM 02/06/2023    7:19 AM 01/29/2023    9:33 AM 11/12/2022   11:02 AM 07/30/2022   12:50 PM 07/09/2022    9:43 AM 05/18/2022    3:02 PM  Advanced Directives  Does Patient Have a Medical Advance Directive? Yes No No Yes Yes Yes Yes  Type of Estate agent of [place];Living will;Out of facility DNR (pink MOST or yellow form)   Living will;Out of facility DNR (pink MOST or yellow form) Healthcare Power of [employer] of [place];Living will;Out of facility DNR (pink MOST or yellow form) Healthcare Power of [place];Living will;Out of facility DNR (pink MOST or yellow form)  Does patient want to make changes to medical advance directive? No - Patient declined   No - Patient declined No - Patient declined No - Patient declined   Copy of Healthcare Power of Attorney in Chart?     No - copy requested Yes - validated most recent copy scanned in chart (See row information) Yes - validated most recent copy scanned in chart (See row information)  Would patient like information on creating a medical advance directive?  No - Patient declined       Pre-existing out of facility DNR order (yellow form or pink MOST form)    Yellow form placed in chart (order not valid for inpatient use)  Yellow form placed in chart (order not valid for  inpatient use) Yellow form placed in chart (order not valid for inpatient use)    Current Medications (verified) Outpatient Encounter Medications as of 05/27/2023  Medication Sig   albuterol (VENTOLIN HFA) 108 (90 Base) MCG/ACT inhaler Inhale 2 puffs into the lungs every 6 (six) hours as needed for wheezing or shortness of breath.   ascorbic acid (VITAMIN C) 500 MG tablet Take 500 mg by mouth daily.   buPROPion (WELLBUTRIN XL) 150 MG 24 hr tablet Take 1 tablet (150 mg total) by mouth daily.   carvedilol (COREG) 3.125 MG tablet TAKE 1 TABLET BY MOUTH TWICE  DAILY   Cholecalciferol (VITAMIN D3) 50 MCG (2000 UT) TABS Take 2,000 Units by mouth daily.    Clobetasol Prop Emollient Base (CLOBETASOL PROPIONATE E) 0.05 % emollient cream Apply 1 Application topically at bedtime.   diclofenac Sodium (PENNSAID) 2 % SOLN APPLY 2 PUMPS (40MG ) UP TO TWO PAINFUL AREAS TWICE A DAY   diphenhydrAMINE (BENADRYL) 50 MG tablet 50 mg oral diphenhydramine administered one hour prior to contrast administration.   DULoxetine (CYMBALTA) 60 MG capsule Take 60 mg by mouth daily.   estradiol (ESTRACE) 0.1 MG/GM vaginal cream Place 0.5 g vaginally 2 (two) times a week. Place 0.5g nightly for two weeks then twice a week  after   famotidine (PEPCID) 10 MG tablet Take 10 mg by mouth daily as needed for heartburn or indigestion.   furosemide (LASIX) 20 MG tablet Take 1 tablet (20 mg total) by mouth daily as needed.   levothyroxine (SYNTHROID) 88 MCG tablet Take 1 tablet (88 mcg total) by mouth daily.   lidocaine (XYLOCAINE) 5 % ointment Apply 1 Application topically as needed (up to 4x/day as needed).   Lidocaine 4 % PTCH Apply 1 patch topically every 12 (twelve) hours. Apply to back   lidocaine-prilocaine (EMLA) cream    lidocaine-prilocaine (EMLA) cream Apply 1 Application topically as needed.   lubiprostone (AMITIZA) 8 MCG capsule TAKE ONE CAPSULE BY MOUTH TWICE DAILY WITH A MEAL   Magnesium 250 MG TABS Take 250 mg by mouth  daily.   metaxalone (SKELAXIN) 800 MG tablet Take 0.5 tablets (400 mg total) by mouth 3 (three) times daily as needed for muscle spasms.   Misc Natural Products (PUMPKIN SEED OIL) CAPS Take 1 capsule by mouth daily.   NALTREXONE HCL, PAIN, PO Take 4 mg by mouth at bedtime.   NONFORMULARY OR COMPOUNDED ITEM Apply 120 Tubes topically daily. Diclofenac/Cyclobenzaprine/lamotrigine/lidocaine/prilocaine (81191) 2%/2%/6%/5%/1.25% cream QTY: 120 GM SIG: (NEURO) APPLY 1-2 PUMPS (1-2 GMS) TO AFFECTED AREA (S) OR FOCAL POINTS 3 TO 4 TIMES DAILY. RUB IN FOR 2 MINUTES TO ACHIEVE MAX PENETRATION. WASH HANDS WELL.   Omega-3 Fatty Acids (FISH OIL) 1200 MG CAPS Take 1,200 mg by mouth daily.   omeprazole (PRILOSEC) 40 MG capsule TAKE 1 CAPSULE BY MOUTH DAILY   Polyethyl Glycol-Propyl Glycol (SYSTANE) 0.4-0.3 % SOLN Place 1 drop into both eyes at bedtime.   POLYETHYLENE GLYCOL 3350 PO Take 1 Capful by mouth daily.   predniSONE (DELTASONE) 50 MG tablet three doses of 50 mg oral prednisone administered 13, 7, and 1 hour prior to contrast administration   pregabalin (LYRICA) 25 MG capsule Take 1 capsule (25 mg total) by mouth 2 (two) times daily.   senna-docusate (SENOKOT S) 8.6-50 MG tablet Take 2 tablets by mouth daily.   spironolactone (ALDACTONE) 25 MG tablet TAKE 1 TABLET BY MOUTH IN THE  MORNING   telmisartan (MICARDIS) 20 MG tablet Take 1 tablet (20 mg total) by mouth daily.   Trospium Chloride 60 MG CP24 Take 1 capsule (60 mg total) by mouth daily.   Turmeric (QC TUMERIC COMPLEX PO) Take 1,000 mg by mouth daily.   valACYclovir (VALTREX) 1000 MG tablet Take 1 tablet (1,000 mg total) by mouth 2 (two) times daily for 10 days.   Vitamin E 400 units TABS Take 400 Units by mouth daily.    Facility-Administered Encounter Medications as of 05/27/2023  Medication   denosumab (PROLIA) injection 60 mg    Allergies (verified) Iodine, Shellfish allergy, Memantine, Aspirin, and Codeine   History: Past Medical  History:  Diagnosis Date   Arthritis    Cancer (HCC)    left breast cancer    Cataracts, bilateral    removed by surgery   CHF (congestive heart failure) (HCC)    PACEMAKER & DEFIB   Complication of anesthesia    hypotensive after back surgery in 2006   Depression    Dyslipidemia    Fainted 04/21/06   AT [facility]   GERD (gastroesophageal reflux disease)    Headache(784.0)    Hearing loss    bilateral hearing aids   HLD (hyperlipidemia)    diet controlled    Hypertension    Hypothyroidism    ICD (implantable cardiac  defibrillator) in place    pt has pacer/icd   ICD (implantable cardiac defibrillator), biventricular, in situ    LBBB (left bundle branch block)    Memory loss    Nonischemic cardiomyopathy (HCC)    Normal coronary arteries    s/p cardiac cath 2007   Pacemaker    ICD [employer]   Syncope    Systolic CHF [facility])    Vertigo    Wears glasses    Past Surgical History:  Procedure Laterality Date   BACK SURGERY     lumbar fusion    BIV ICD GENERATOR CHANGEOUT N/A 02/06/2023   Procedure: BIV ICD GENERATOR CHANGEOUT;  Surgeon: Marinus Maw, MD;  Location[facility] INVASIVE CV LAB;  Service: Cardiovascular;  Laterality: N/A;   BREAST LUMPECTOMY Left 2008   BREAST SURGERY  2000   LUMP REMOVAL. STAGE 1 CANCER   CARDIAC CATHETERIZATION     CATARACT EXTRACTION     COLONOSCOPY     EYE SURGERY     IMPLANTABLE CARDIOVERTER DEFIBRILLATOR GENERATOR CHANGE N/A 12/18/2012   Procedure: IMPLANTABLE CARDIOVERTER DEFIBRILLATOR GENERATOR CHANGE;  Surgeon: Marinus Maw, MD;  Location: [facility] CATH LAB;  Service: Cardiovascular;  Laterality: N/A;   JOINT REPLACEMENT  06/14/01   right   LUMBAR FUSION  2006   MASS EXCISION  11/08/2011   Procedure: EXCISION MASS;  Surgeon: Almond Lint, MD;  Location: [facility] ORS;  Service: General;  Laterality: Left;  Excision Left Thigh Mass   MASTECTOMY W/ SENTINEL NODE BIOPSY Left 06/04/2019   Procedure: LEFT MASTECTOMY WITH SENTINEL LYMPH NODE BIOPSY;   Surgeon: Almond Lint, MD;  Location: [facility] OR;  Service: General;  Laterality: Left;   MASTECTOMY, PARTIAL  2008   GOT PACEMAKER AND DEFIB AT THAT TIME   PACEMAKER INSERTION  04/23/06   TOTAL KNEE ARTHROPLASTY  05/17/01   RIGHT KNEE   TOTAL KNEE ARTHROPLASTY Left 11/29/2014   Procedure: TOTAL LEFT KNEE ARTHROPLASTY;  Surgeon: Durene Romans, MD;  Location: [facility] ORS;  Service: Orthopedics;  Laterality: Left;   Family History  Problem Relation Age of Onset   Hypertension Mother    Arthritis Mother    Hypertension Father    Hypertension Brother    Hypertension Brother    Social History   Socioeconomic History   Marital status: Married    Spouse name: Not on file   Number of children: 1   Years of education: Masters   Highest education level: Master's degree (e.g., MA, MS, MEng, MEd, MSW, MBA)  Occupational History   Occupation: Retired  Tobacco Use   Smoking status: Never    Passive exposure: Never   Smokeless tobacco: Never  Vaping Use   Vaping status: Never Used  Substance and Sexual Activity   Alcohol use: No   Drug use: No   Sexual activity: Not Currently    Birth control/protection: Post-menopausal  Other Topics Concern   Not on file  Social History Narrative   Lives at home with husband.   Right-handed.      As of 07/28/2014   Diet: No special diet   Caffeine: yes, Chocolate, tea and sodas    Married: YES, 1970   House: Yes, 2 stories, 2-3 persons live in home   Pets: No   Current/Past profession: Nurse, mental health, Publishing rights manager    Exercise: Yes 2-3 x weekly   Living Will: Yes   DNR: No   POA/HPOA: No      Social Drivers of Dispensing optician  Resource Strain: Low Risk  (02/08/2023)   Overall Financial Resource Strain (CARDIA)    Difficulty of Paying Living Expenses: Not hard at all  Food Insecurity: No Food Insecurity (02/08/2023)   Hunger Vital Sign    Worried About Running Out of Food in the Last Year: Never true    Ran Out of Food in the Last Year: Never  true  Transportation Needs: No Transportation Needs (02/08/2023)   PRAPARE - Administrator, Civil Service (Medical): No    Lack of Transportation (Non-Medical): No  Physical Activity: Unknown (02/08/2023)   Exercise Vital Sign    Days of Exercise per Week: 0 days    Minutes of Exercise per Session: Not on file  Stress: No Stress Concern Present (02/08/2023)   [employer] of Occupational Health - Occupational Stress Questionnaire    Feeling of Stress : Only a little  Social Connections: Moderately Integrated (02/08/2023)   Social Connection and Isolation Panel [NHANES]    Frequency of Communication with Friends and Family: Three times a week    Frequency of Social Gatherings with Friends and Family: Patient declined    Attends Religious Services: More than 4 times per year    Active Member of [employer] or Organizations: Yes    Attends Banker Meetings: More than 4 times per year    Marital Status: Widowed    Tobacco Counseling Counseling given: Not Answered   Clinical Intake:  Pre-visit preparation completed: Yes  Pain : 0-10 Pain Score: 6  Pain Type: Chronic pain Pain Location: Back Pain Orientation: Right, Left Pain Descriptors / Indicators: Aching Pain Onset: Today Pain Frequency: Occasional Pain Relieving Factors: pain meds  Pain Relieving Factors: pain meds  BMI - recorded: 256 Nutritional Status: BMI 25 -29 Overweight Diabetes: No  How often do you need to have someone help you when you read instructions, pamphlets, or other written materials from your doctor or pharmacy?: 2 - Rarely What is the last grade level you completed in school?: masters  Interpreter Needed?: No      Activities of Daily Living    05/27/2023   10:57 AM 05/27/2023   10:49 AM  In your present state of health, do you have any difficulty performing the following activities:  Hearing? 0 1  Comment  has hearing aids  Vision? 0 0  Difficulty concentrating or  making decisions? 1 1  Walking or climbing stairs? 1 1  Dressing or bathing? 1   Doing errands, shopping? 1 1  Preparing Food and eating ? N N  Using the Toilet? N N  In the past six months, have you accidently leaked urine? Y Y  Do you have problems with loss of bowel control? Y Y  Managing your Medications? N N  Managing your Finances? N N  Housekeeping or managing your Housekeeping? N N    Patient Care Team: [name], NP as PCP - General (Geriatric Medicine) Marinus Maw, MD as PCP - Electrophysiology (Cardiology) Almond Lint, MD as Consulting Physician (General Surgery) Durene Romans, MD as Consulting Physician (Orthopedic Surgery) Blima Ledger, OD (Optometry) Levert Feinstein, MD as Consulting Physician (Neurology) Marinus Maw, MD as Consulting Physician (Cardiology) Marcellina Millin, MD (Psychiatry)  Indicate any recent Medical Services you may have received from other than [facility] providers in the past year (date may be approximate).     Assessment:   This is a routine wellness examination for [place].  Hearing/Vision screen No results found.  Goals Addressed   None    Depression Screen    05/27/2023   10:43 AM 05/01/2023   11:31 AM 01/30/2023   10:38 AM 11/12/2022   11:14 AM 08/01/2022   10:51 AM 07/09/2022   10:41 AM 05/18/2022    3:02 PM  PHQ 2/9 Scores  PHQ - 2 Score 0 2 2  0 0 0  Exception Documentation    Other- indicate reason in comment box     Not completed    Patient has a counslor       Fall Risk    05/27/2023   10:43 AM 05/01/2023   11:31 AM 02/11/2023    2:22 PM 01/30/2023   10:37 AM 01/29/2023    9:33 AM  Fall Risk   Falls in the past year? 0 0 0 0 0  Number falls in past yr: 0 0 0 0 0  Injury with Fall? 0 0 0 0 0  Risk for fall due to : No Fall Risks;Impaired balance/gait;Impaired mobility  No Fall Risks    Follow up Falls evaluation completed  Falls evaluation completed  Falls evaluation completed    MEDICARE RISK AT  HOME:    TIMED UP AND GO:  Was the test performed?  No    Cognitive Function:    05/27/2023   10:53 AM 01/29/2023   12:00 PM 07/30/2022    1:00 PM 05/18/2022    3:02 PM 01/24/2022    1:00 PM  MMSE - Mini Mental State Exam  Orientation to time 5 4 5 5 5   Orientation to Place 5 5 5 5 5   Registration 3 3 3 3 3   Attention/ Calculation 3 5 5 2 5   Recall 3 3 3 3 3   Language- name 2 objects 2 2 2 2 2   Language- repeat 1 1 1 1 1   Language- follow 3 step command 3 3 3 3 3   Language- read & follow direction 1 1 1 1 1   Write a sentence 1 1 1 1 1   Copy design 1 0 1 1 1   Total score 28 28 30 27 30       10/13/2020   10:00 AM  Montreal Cognitive Assessment   Visuospatial/ Executive (0/5) 2  Naming (0/3) 3  Attention: Read list of digits (0/2) 2  Attention: Read list of letters (0/1) 1  Attention: Serial 7 subtraction starting at 100 (0/3) 2  Language: Repeat phrase (0/2) 1  Language : Fluency (0/1) 1  Abstraction (0/2) 1  Delayed Recall (0/5) 2  Orientation (0/6) 6  Total 21  Adjusted Score (based on education) 21      05/30/2021    2:36 PM  6CIT Screen  What Year? 0 points  What month? 0 points  What time? 0 points  Count back from 20 0 points  Months in reverse 0 points  Repeat phrase 2 points  Total Score 2 points    Immunizations Immunization History  Administered Date(s) Administered   Fluad Quad(high Dose 65+) 01/28/2020, 01/17/2021   Influenza, High Dose Seasonal PF 01/16/2018, 12/08/2018, 01/12/2022, 01/18/2023   Influenza-Unspecified 12/08/2013, 01/10/2015, 12/09/2015, 01/12/2017, 01/17/2021   PFIZER(Purple Top)SARS-COV-2 Vaccination 05/23/2019, 06/15/2019, 12/23/2019, 08/30/2020   PNEUMOCOCCAL CONJUGATE-20 01/05/2022   Pfizer Covid-19 Vaccine Bivalent Booster 69yrs & up 02/03/2021, 01/12/2022   Pfizer(Comirnaty)Fall Seasonal Vaccine 12 years and older 12/24/2022   Pneumococcal Conjugate-13 04/09/2012   Pneumococcal Polysaccharide-23 02/28/2016   Respiratory  Syncytial Virus Vaccine,Recomb Aduvanted(Arexvy) 01/05/2022   Tdap 01/07/2013  Zoster Recombinant(Shingrix) 02/03/2021, 06/26/2021   Zoster, Live 08/05/2014, 02/03/2021    TDAP status: Due, Education has been provided regarding the importance of this vaccine. Advised may receive this vaccine at local pharmacy or Health Dept. Aware to provide a copy of the vaccination record if obtained from local pharmacy or Health Dept. Verbalized acceptance and understanding.  Flu Vaccine status: Up to date  Pneumococcal vaccine status: Up to date  Covid-19 vaccine status: Information provided on how to obtain vaccines.   Qualifies for Shingles Vaccine? Yes   Zostavax completed No   Shingrix Completed?: Yes  Screening Tests Health Maintenance  Topic Date Due   DTaP/Tdap/Td (2 - Td or Tdap) 01/08/2023   COVID-19 Vaccine (8 - 2024-25 season) 02/18/2023   MAMMOGRAM  04/08/2024   Medicare Annual Wellness (AWV)  05/26/2024   Pneumonia Vaccine 62+ Years old  Completed   INFLUENZA VACCINE  Completed   DEXA SCAN  Completed   Zoster Vaccines- Shingrix  Completed   HPV VACCINES  Aged Out    Health Maintenance  Health Maintenance Due  Topic Date Due   DTaP/Tdap/Td (2 - Td or Tdap) 01/08/2023   COVID-19 Vaccine (8 - 2024-25 season) 02/18/2023    Colorectal cancer screening: No longer required.   Mammogram status: No longer required due to age.  Bone Density status: Ordered today. Pt provided with contact info and advised to call to schedule appt.  Lung Cancer Screening: (Low Dose CT Chest recommended if Age 60-80 years, 20 pack-year currently smoking OR have quit w/in 15years.) does not qualify.   Lung Cancer Screening Referral: na  Additional Screening:  Hepatitis C Screening: does not qualify  Vision Screening: Recommended annual ophthalmology exams for early detection of glaucoma and other disorders of the eye. Is the patient up to date with their annual eye exam?  Yes  Who is the  provider or what is the name of the office in which the patient attends annual eye exams? [facility] If pt is not established with a provider, would they like to be referred to a provider to establish care? No .   Dental Screening: Recommended annual dental exams for proper oral hygiene   Community Resource Referral / Chronic Care Management: CRR required this visit?  No   CCM required this visit?  No     Plan:     I have personally reviewed and noted the following in the patient's chart:   Medical and social history Use of alcohol, tobacco or illicit drugs  Current medications and supplements including opioid prescriptions. Patient is currently taking opioid prescriptions. Information provided to patient regarding non-opioid alternatives. Patient advised to discuss non-opioid treatment plan with their provider. Functional ability and status Nutritional status Physical activity Advanced directives List of other physicians Hospitalizations, surgeries, and ER visits in previous 12 months Vitals Screenings to include cognitive, depression, and falls Referrals and appointments  In addition, I have reviewed and discussed with patient certain preventive protocols, quality metrics, and best practice recommendations. A written personalized care plan for preventive services as well as general preventive health recommendations were provided to patient.     [name], NP   05/27/2023

## 2023-05-27 NOTE — Patient Instructions (Addendum)
  [name] , Thank you for taking time to come for your Medicare Wellness Visit. I appreciate your ongoing commitment to your health goals. Please review the following plan we discussed and let me know if I can assist you in the future.   To get TDAP vaccine at local pharmacy   Call [facility] to schedule bone density    This is a list of the screening recommended for you and due dates:  Health Maintenance  Topic Date Due   DTaP/Tdap/Td vaccine (2 - Td or Tdap) 01/08/2023   COVID-19 Vaccine (8 - 2024-25 season) 02/18/2023   Mammogram  04/08/2024   Medicare Annual Wellness Visit  05/26/2024   Pneumonia Vaccine  Completed   Flu Shot  Completed   DEXA scan (bone density measurement)  Completed   Zoster (Shingles) Vaccine  Completed   HPV Vaccine  Aged Out

## 2023-05-28 ENCOUNTER — Encounter: Payer: Self-pay | Admitting: Nurse Practitioner

## 2023-05-28 LAB — CBC WITH DIFFERENTIAL/PLATELET
Absolute Lymphocytes: 1847 {cells}/uL (ref 850–3900)
Absolute Monocytes: 489 {cells}/uL (ref 200–950)
Basophils Absolute: 88 {cells}/uL (ref 0–200)
Basophils Relative: 1.2 %
Eosinophils Absolute: 212 {cells}/uL (ref 15–500)
Eosinophils Relative: 2.9 %
HCT: 37 % (ref 35.0–45.0)
Hemoglobin: 11.8 g/dL (ref 11.7–15.5)
MCH: 28.6 pg (ref 27.0–33.0)
MCHC: 31.9 g/dL — ABNORMAL LOW (ref 32.0–36.0)
MCV: 89.8 fL (ref 80.0–100.0)
MPV: 11.3 fL (ref 7.5–12.5)
Monocytes Relative: 6.7 %
Neutro Abs: 4665 {cells}/uL (ref 1500–7800)
Neutrophils Relative %: 63.9 %
Platelets: 142 10*3/uL (ref 140–400)
RBC: 4.12 10*6/uL (ref 3.80–5.10)
RDW: 13.2 % (ref 11.0–15.0)
Total Lymphocyte: 25.3 %
WBC: 7.3 10*3/uL (ref 3.8–10.8)

## 2023-05-28 LAB — COMPLETE METABOLIC PANEL WITH GFR
AG Ratio: 1.6 (calc) (ref 1.0–2.5)
ALT: 15 U/L (ref 6–29)
AST: 17 U/L (ref 10–35)
Albumin: 4.2 g/dL (ref 3.6–5.1)
Alkaline phosphatase (APISO): 52 U/L (ref 37–153)
BUN: 21 mg/dL (ref 7–25)
CO2: 30 mmol/L (ref 20–32)
Calcium: 10.1 mg/dL (ref 8.6–10.4)
Chloride: 98 mmol/L (ref 98–110)
Creat: 0.81 mg/dL (ref 0.60–0.95)
Globulin: 2.7 g/dL (ref 1.9–3.7)
Glucose, Bld: 110 mg/dL (ref 65–139)
Potassium: 4.6 mmol/L (ref 3.5–5.3)
Sodium: 134 mmol/L — ABNORMAL LOW (ref 135–146)
Total Bilirubin: 0.4 mg/dL (ref 0.2–1.2)
Total Protein: 6.9 g/dL (ref 6.1–8.1)
eGFR: 70 mL/min/{1.73_m2} (ref >=60)

## 2023-05-28 LAB — TSH: TSH: 0.73 m[IU]/L (ref 0.40–4.50)

## 2023-05-30 ENCOUNTER — Ambulatory Visit: Payer: Medicare Other

## 2023-05-30 DIAGNOSIS — M810 Age-related osteoporosis without current pathological fracture: Secondary | ICD-10-CM | POA: Diagnosis not present

## 2023-05-30 MED ORDER — DENOSUMAB 60 MG/ML ~~LOC~~ SOSY
60.0000 mg | PREFILLED_SYRINGE | SUBCUTANEOUS | Status: AC
  Administered 2023-12-02: 60 mg via SUBCUTANEOUS

## 2023-06-02 ENCOUNTER — Other Ambulatory Visit: Payer: Self-pay | Admitting: Obstetrics and Gynecology

## 2023-06-04 ENCOUNTER — Other Ambulatory Visit: Payer: Self-pay | Admitting: Internal Medicine

## 2023-06-04 ENCOUNTER — Other Ambulatory Visit: Payer: Self-pay | Admitting: Family

## 2023-06-04 ENCOUNTER — Inpatient Hospital Stay (HOSPITAL_COMMUNITY): Admission: RE | Admit: 2023-06-04 | Payer: Medicare Other | Source: Ambulatory Visit

## 2023-06-04 ENCOUNTER — Ambulatory Visit (HOSPITAL_COMMUNITY): Payer: Medicare Other

## 2023-06-04 NOTE — Telephone Encounter (Signed)
 Pharmacy Requested refill.  Pended and sent to [facility] for approval due to HIGH ALERT Warning.

## 2023-06-10 ENCOUNTER — Ambulatory Visit: Payer: Medicare Other | Admitting: Nurse Practitioner

## 2023-06-10 ENCOUNTER — Encounter: Payer: Self-pay | Admitting: Nurse Practitioner

## 2023-06-10 VITALS — BP 122/78 | HR 72 | Temp 97.6°F | Ht <= 58 in | Wt 120.0 lb

## 2023-06-10 DIAGNOSIS — M19049 Primary osteoarthritis, unspecified hand: Secondary | ICD-10-CM

## 2023-06-10 DIAGNOSIS — M5441 Lumbago with sciatica, right side: Secondary | ICD-10-CM

## 2023-06-10 DIAGNOSIS — G894 Chronic pain syndrome: Secondary | ICD-10-CM

## 2023-06-10 DIAGNOSIS — Z79899 Other long term (current) drug therapy: Secondary | ICD-10-CM

## 2023-06-10 DIAGNOSIS — K5904 Chronic idiopathic constipation: Secondary | ICD-10-CM

## 2023-06-10 DIAGNOSIS — I10 Essential (primary) hypertension: Secondary | ICD-10-CM

## 2023-06-10 DIAGNOSIS — E039 Hypothyroidism, unspecified: Secondary | ICD-10-CM

## 2023-06-10 DIAGNOSIS — F3181 Bipolar II disorder: Secondary | ICD-10-CM

## 2023-06-10 DIAGNOSIS — E782 Mixed hyperlipidemia: Secondary | ICD-10-CM

## 2023-06-10 DIAGNOSIS — F0390 Unspecified dementia without behavioral disturbance: Secondary | ICD-10-CM

## 2023-06-10 DIAGNOSIS — N3281 Overactive bladder: Secondary | ICD-10-CM

## 2023-06-10 DIAGNOSIS — M5442 Lumbago with sciatica, left side: Secondary | ICD-10-CM

## 2023-06-10 DIAGNOSIS — F3341 Major depressive disorder, recurrent, in partial remission: Secondary | ICD-10-CM

## 2023-06-10 DIAGNOSIS — M810 Age-related osteoporosis without current pathological fracture: Secondary | ICD-10-CM

## 2023-06-10 DIAGNOSIS — G8929 Other chronic pain: Secondary | ICD-10-CM

## 2023-06-10 DIAGNOSIS — I5022 Chronic systolic (congestive) heart failure: Secondary | ICD-10-CM

## 2023-06-10 NOTE — Progress Notes (Signed)
 Careteam: Patient Care Team: [name], NP as PCP - General (Geriatric Medicine) Marinus Maw, MD as PCP - Electrophysiology (Cardiology) Almond Lint, MD as Consulting Physician (General Surgery) Durene Romans, MD as Consulting Physician (Orthopedic Surgery) Blima Ledger, OD (Optometry) Levert Feinstein, MD as Consulting Physician (Neurology) Marinus Maw, MD as Consulting Physician (Cardiology) Marcellina Millin, MD (Psychiatry)  PLACE OF SERVICE:  [facility]  Advanced Directive information    Allergies  Allergen Reactions   Iodine Shortness Of Breath    Iodine contrast, CHF , SOB   Shellfish Allergy Shortness Of Breath   Memantine     Malaise, fogginess/ couldn't think   Aspirin Nausea And Vomiting   Codeine Nausea And Vomiting and Other (See Comments)    Other Reaction(s): Not available    Chief Complaint  Patient presents with   Medical Management of Chronic Issues    4 month follow-up. Discussed need for td/tdap and covid boosters. Bilateral leg pain and back area. Left hand pain and weakness, ongoing from a fall several years ago. Patient with ongoing dizziness      HPI: Patient is a 89 y.o. female who comes in for her 4 month follow up of medical management of chronic illness. She is accompanied by her daughter today. She has an extensive health history including CHF, HTN, CVA, atherosclerosis, hypothyroidism, sciatica, neuropathy, osteoporosis, arthritis, depression, memory loss, chronic low back pain, HLD, Bipolar II disorder, and chronic pain syndrome.   Today she complains of worsening chronic lower back pain and sciatica, the pain is going down both legs to bilateral heels. She is being followed by sports medicine for this and has a CT lumbar with contrast scheduled for this week. Due to her allergy to shellfish and iodine, she will need to take prednisone and benadryl prior to CT with contrast. She uses her cane to get around and complains she is  walking slower than usual and is more unsteady than usual. Taking tylenol for pain relief in addition to metaxalone and Lyrica. She had completed physical therapy for back pain previously with some relief, and is willing to try again after this CT scan results.   She has an extensive med list and she mentions that her memory is worsening, she is forgetting why she is taking many of her medications.   She mentions her mood has been normal with occasional times of depression. She states she sleeps well at night on her back with a pillow under her knees.   She is having normal bowel movements. She has a pessary for a prolapsed bladder and is followed by urogynecology. She is troubled by her frequent urination, she gets up frequently at night to urinate. She is also drinking less water during the day due to fear of incontinence. She normally would drink 16 oz water each day. She is not taking her PRN Lasix for leg swelling due to fear of increased urination. She has a follow up appointment with UROGYN this month after recently completing a urethral bulking injection. She mentions  incontinence with sneezing and coughing is improved but in general incontinence and frequency not improved.  Her appetite is good, she continues to prep and cook her own meals. She has gained 8 lbs in 1 year. She does mention she feels more fatigued and weak.  She denies any falls, SOB, CP, HA, nausea, and vomiting.  Several days ago she noticed when she wakes up she sees moving spots in front of both  eyes. She is following up with her ophthalmologist tomorrow. She sees them routinely every year. She also goes to her dentist routinely every 6 months.    Review of Systems:  Review of Systems  Constitutional:  Positive for malaise/fatigue.  Eyes:        Floating spots in both eyes in the mornings  Respiratory: Negative.    Cardiovascular:  Positive for leg swelling.  Gastrointestinal:  Positive for heartburn.   Genitourinary:  Positive for frequency and urgency.  Musculoskeletal:  Positive for back pain and myalgias.  Neurological:  Positive for dizziness, tremors and weakness.  Psychiatric/Behavioral:  Positive for depression and memory loss.     Past Medical History:  Diagnosis Date   Arthritis    Cancer (HCC)    left breast cancer    Cataracts, bilateral    removed by surgery   CHF (congestive heart failure) (HCC)    PACEMAKER & DEFIB   Complication of anesthesia    hypotensive after back surgery in 2006   Depression    Dyslipidemia    Fainted 04/21/06   AT [facility]   GERD (gastroesophageal reflux disease)    Headache(784.0)    Hearing loss    bilateral hearing aids   HLD (hyperlipidemia)    diet controlled    Hypertension    Hypothyroidism    ICD (implantable cardiac defibrillator) in place    pt has pacer/icd   ICD (implantable cardiac defibrillator), biventricular, in situ    LBBB (left bundle branch block)    Memory loss    Nonischemic cardiomyopathy (HCC)    Normal coronary arteries    s/p cardiac cath 2007   Pacemaker    ICD [employer]   Syncope    Systolic CHF [facility])    Vertigo    Wears glasses    Past Surgical History:  Procedure Laterality Date   BACK SURGERY     lumbar fusion    BIV ICD GENERATOR CHANGEOUT N/A 02/06/2023   Procedure: BIV ICD GENERATOR CHANGEOUT;  Surgeon: Marinus Maw, MD;  Location: [facility] INVASIVE CV LAB;  Service: Cardiovascular;  Laterality: N/A;   BREAST LUMPECTOMY Left 2008   BREAST SURGERY  2000   LUMP REMOVAL. STAGE 1 CANCER   CARDIAC CATHETERIZATION     CATARACT EXTRACTION     COLONOSCOPY     EYE SURGERY     IMPLANTABLE CARDIOVERTER DEFIBRILLATOR GENERATOR CHANGE N/A 12/18/2012   Procedure: IMPLANTABLE CARDIOVERTER DEFIBRILLATOR GENERATOR CHANGE;  Surgeon: Marinus Maw, MD;  Location: [facility] CATH LAB;  Service: Cardiovascular;  Laterality: N/A;   JOINT REPLACEMENT  06/14/01   right   LUMBAR FUSION  2006   MASS EXCISION   11/08/2011   Procedure: EXCISION MASS;  Surgeon: Almond Lint, MD;  Location: [facility] ORS;  Service: General;  Laterality: Left;  Excision Left Thigh Mass   MASTECTOMY W/ SENTINEL NODE BIOPSY Left 06/04/2019   Procedure: LEFT MASTECTOMY WITH SENTINEL LYMPH NODE BIOPSY;  Surgeon: Almond Lint, MD;  Location: [facility] OR;  Service: General;  Laterality: Left;   MASTECTOMY, PARTIAL  2008   GOT PACEMAKER AND DEFIB AT THAT TIME   PACEMAKER INSERTION  04/23/06   TOTAL KNEE ARTHROPLASTY  05/17/01   RIGHT KNEE   TOTAL KNEE ARTHROPLASTY Left 11/29/2014   Procedure: TOTAL LEFT KNEE ARTHROPLASTY;  Surgeon: Durene Romans, MD;  Location: [facility] ORS;  Service: Orthopedics;  Laterality: Left;   Social History:   reports that she has never smoked. She has never been exposed  to tobacco smoke. She has never used smokeless tobacco. She reports that she does not drink alcohol and does not use drugs.  Family History  Problem Relation Age of Onset   Hypertension Mother    Arthritis Mother    Hypertension Father    Hypertension Brother    Hypertension Brother     Medications: Patient's Medications  New Prescriptions   No medications on file  Previous Medications   ALBUTEROL (VENTOLIN HFA) 108 (90 BASE) MCG/ACT INHALER    Inhale 2 puffs into the lungs every 6 (six) hours as needed for wheezing or shortness of breath.   ASCORBIC ACID (VITAMIN C) 500 MG TABLET    Take 500 mg by mouth daily.   BUPROPION (WELLBUTRIN XL) 150 MG 24 HR TABLET    Take 1 tablet (150 mg total) by mouth daily.   CARVEDILOL (COREG) 3.125 MG TABLET    TAKE 1 TABLET BY MOUTH TWICE  DAILY   CHOLECALCIFEROL (VITAMIN D3) 50 MCG (2000 UT) TABS    Take 2,000 Units by mouth daily.    CLOBETASOL PROP EMOLLIENT BASE (CLOBETASOL PROPIONATE E) 0.05 % EMOLLIENT CREAM    Apply 1 Application topically at bedtime.   DICLOFENAC SODIUM (PENNSAID) 2 % SOLN    APPLY 2 PUMPS (40MG ) UP TO TWO PAINFUL AREAS TWICE A DAY   DIPHENHYDRAMINE (BENADRYL) 50 MG TABLET    50 mg oral  diphenhydramine administered one hour prior to contrast administration.   DULOXETINE (CYMBALTA) 60 MG CAPSULE    Take 60 mg by mouth daily.   ESTRADIOL (ESTRACE) 0.1 MG/GM VAGINAL CREAM    Place 0.5 g vaginally 2 (two) times a week. Place 0.5g nightly for two weeks then twice a week after   FAMOTIDINE (PEPCID) 10 MG TABLET    Take 10 mg by mouth daily as needed for heartburn or indigestion.   FUROSEMIDE (LASIX) 20 MG TABLET    Take 1 tablet (20 mg total) by mouth daily as needed.   LEVOTHYROXINE (SYNTHROID) 88 MCG TABLET    Take 1 tablet (88 mcg total) by mouth daily.   LIDOCAINE (XYLOCAINE) 5 % OINTMENT    Apply 1 Application topically as needed (up to 4x/day as needed).   LIDOCAINE 4 % PTCH    Apply 1 patch topically every 12 (twelve) hours. Apply to back   LIDOCAINE-PRILOCAINE (EMLA) CREAM       LIDOCAINE-PRILOCAINE (EMLA) CREAM    Apply 1 Application topically as needed.   LUBIPROSTONE (AMITIZA) 8 MCG CAPSULE    TAKE ONE CAPSULE BY MOUTH TWICE DAILY WITH A MEAL   MAGNESIUM 250 MG TABS    Take 250 mg by mouth daily.   METAXALONE (SKELAXIN) 800 MG TABLET    Take 0.5 tablets (400 mg total) by mouth 3 (three) times daily as needed for muscle spasms.   MISC NATURAL PRODUCTS (PUMPKIN SEED OIL) CAPS    Take 1 capsule by mouth daily.   NALTREXONE HCL, PAIN, PO    Take 4 mg by mouth at bedtime.   NONFORMULARY OR COMPOUNDED ITEM    Apply 120 Tubes topically daily. Diclofenac/Cyclobenzaprine/lamotrigine/lidocaine/prilocaine (40981) 2%/2%/6%/5%/1.25% cream QTY: 120 GM SIG: (NEURO) APPLY 1-2 PUMPS (1-2 GMS) TO AFFECTED AREA (S) OR FOCAL POINTS 3 TO 4 TIMES DAILY. RUB IN FOR 2 MINUTES TO ACHIEVE MAX PENETRATION. WASH HANDS WELL.   OMEGA-3 FATTY ACIDS (FISH OIL) 1200 MG CAPS    Take 1,200 mg by mouth daily.   OMEPRAZOLE (PRILOSEC) 40 MG CAPSULE  TAKE 1 CAPSULE BY MOUTH DAILY   POLYETHYL GLYCOL-PROPYL GLYCOL (SYSTANE) 0.4-0.3 % SOLN    Place 1 drop into both eyes at bedtime.   POLYETHYLENE GLYCOL 3350 PO     Take 1 Capful by mouth daily.   PREDNISONE (DELTASONE) 50 MG TABLET    three doses of 50 mg oral prednisone administered 13, 7, and 1 hour prior to contrast administration   PREGABALIN (LYRICA) 25 MG CAPSULE    Take 1 capsule (25 mg total) by mouth 2 (two) times daily.   SENNA-DOCUSATE (SENOKOT S) 8.6-50 MG TABLET    Take 2 tablets by mouth daily.   SPIRONOLACTONE (ALDACTONE) 25 MG TABLET    TAKE 1 TABLET BY MOUTH IN THE  MORNING   TELMISARTAN (MICARDIS) 20 MG TABLET    Take 1 tablet (20 mg total) by mouth daily.   TROSPIUM CHLORIDE 60 MG CP24    Take 1 capsule (60 mg total) by mouth daily.   TURMERIC (QC TUMERIC COMPLEX PO)    Take 1,000 mg by mouth daily.   VITAMIN E 400 UNITS TABS    Take 400 Units by mouth daily.   Modified Medications   No medications on file  Discontinued Medications   No medications on file    Physical Exam:  Vitals:   06/10/23 1355  BP: 122/78  Pulse: 72  Temp: 97.6 F (36.4 C)  Weight: 120 lb (54.4 kg)  Height: 4\' 10"  (1.473 m)   Body mass index is 25.08 kg/m. Wt Readings from Last 3 Encounters:  06/10/23 120 lb (54.4 kg)  05/27/23 119 lb 9.6 oz (54.3 kg)  05/14/23 118 lb 3.2 oz (53.6 kg)    Physical Exam Vitals reviewed.  Constitutional:      Appearance: Normal appearance.  HENT:     Head: Normocephalic and atraumatic.     Mouth/Throat:     Mouth: Mucous membranes are moist.     Pharynx: Oropharynx is clear.  Eyes:     General: Vision grossly intact.     Conjunctiva/sclera: Conjunctivae normal.  Cardiovascular:     Rate and Rhythm: Normal rate and regular rhythm.     Pulses: Normal pulses.     Heart sounds: Normal heart sounds.  Pulmonary:     Effort: Pulmonary effort is normal.     Breath sounds: Normal breath sounds.  Abdominal:     General: Abdomen is flat.     Palpations: Abdomen is soft.  Musculoskeletal:        General: Tenderness present.     Cervical back: Neck supple.     Right lower leg: Tenderness present. 1+ Edema  present.     Left lower leg: Tenderness present. 1+ Edema present.  Skin:    General: Skin is warm and dry.  Neurological:     Mental Status: She is alert. Mental status is at baseline.     Motor: Weakness present.  Psychiatric:        Mood and Affect: Mood normal.        Behavior: Behavior normal.     Labs reviewed: Basic Metabolic Panel: Recent Labs    07/09/22 1143 08/13/22 1018 01/16/23 1130 01/23/23 0956 05/27/23 1110  NA  --   --  133* 136 134*  K  --   --  4.4 4.6 4.6  CL  --   --  99 99 98  CO2  --   --  31 25 30   GLUCOSE  --   --  96 108* 110  BUN  --   --  17 18 21   CREATININE  --   --  0.82 0.81 0.81  CALCIUM  --   --  10.1 10.3 10.1  TSH 0.14* 0.80  --   --  0.73   Liver Function Tests: Recent Labs    06/22/22 0918 01/16/23 1130 01/23/23 0956 05/27/23 1110  AST 26 19 23 17   ALT 16 13 18 15   ALKPHOS 89 63 76  --   BILITOT 0.4 0.4 0.2 0.4  PROT 7.8 7.4 7.0 6.9  ALBUMIN 3.6 3.9 4.0  --    No results for input(s): "LIPASE", "AMYLASE" in the last 8760 hours. No results for input(s): "AMMONIA" in the last 8760 hours. CBC: Recent Labs    06/22/22 0918 01/16/23 1130 01/23/23 0956 05/27/23 1110  WBC 6.7 6.9 9.3 7.3  NEUTROABS 4.2 4.3  --  4,665  HGB 12.2 12.0 12.5 11.8  HCT 36.9 36.3 38.9 37.0  MCV 86.8 89.4 92 89.8  PLT 150 154 128* 142   Lipid Panel: Recent Labs    07/09/22 1143  CHOL 146  HDL 57  LDLCALC 75  TRIG 67  CHOLHDL 2.6   TSH: Recent Labs    07/09/22 1143 08/13/22 1018 05/27/23 1110  TSH 0.14* 0.80 0.73   A1C: No results found for: "HGBA1C"   Assessment/Plan  1. Chronic systolic heart failure (HCC) (Primary) -euvolemic at this time  - Continue Lasix as needed for lower extremity swelling - Continue Aldactone as prescribed  2. Acquired hypothyroidism -stable - TSH normal 0.73 from 05/27/23 - Continue levothyroxine as prescribed   3. Bipolar II disorder (HCC) Mood stable, continues with psych follow up -  Continue Cymbalta as prescribed    4. Chronic idiopathic constipation - Improved on Amitiza, continue as prescribed  - Continue Senokot as prescribed - Continue Miralax as prescribed   5. Overactive bladder Ongoing,  - Managed by Urogynecology: keep scheduled follow up appointment   6. Essential hypertension Controlled. - CMP results from 05/27/23 normal, with NA slightly decreased 134 however has been chronic and stable - BP stable, 122/78 - Continue Coreg as prescribed - Continue Micardis as prescribed - Continue Aldactone as prescribed  7. Age-related osteoporosis without current pathological fracture - Continue Prolia injections every 6 months  8. Arthritis of hand - Continue medications prescribed by pain management for arthritic pain  - Continue applying Voltaren gel to aching joints being cautious to avoid transfer of gel to eyes or mouth PRN pain  9. Recurrent major depressive disorder, in partial remission (HCC) - Managed by Psychiatry - Stable, continue Wellbutrin as prescribed - Continue Cymbalta as prescribed   11. Dementia without behavioral disturbance (HCC) - At baseline with worsening long term memory loss per patient - Patient continues to prepare own meals and complete own ADLs - Continue supportive care.   12. Chronic pain syndrome Ongoing - Managed by pain management specialist  13. Chronic bilateral low back pain with bilateral sciatica - Chronically managed by pain management specialist - Currently followed by sports medicine, CT lumbar with contrast scheduled for Thursday  14. Mixed hyperlipidemia - Lipid profile completed April 2024, results normal No currently on medication   15. High risk medication use - Discussed decreasing certain medications, especially Skelaxin, she is working with pain management for this.   Return in 4 months for routine follow up.   Lenord Fellers, RN DNP-AGPCNP Student I personally was present during the history,  physical  exam and medical decision-making activities of this service and have verified that the service and findings are accurately documented in the student's note Anibal Quinby K. Biagio Borg [facility] [phone]

## 2023-06-12 ENCOUNTER — Other Ambulatory Visit: Payer: Self-pay | Admitting: Radiology

## 2023-06-13 ENCOUNTER — Ambulatory Visit (HOSPITAL_COMMUNITY)
Admission: RE | Admit: 2023-06-13 | Discharge: 2023-06-13 | Disposition: A | Discharge status: Home / Self Care | Payer: Medicare Other | Source: Ambulatory Visit | Attending: Sports Medicine | Admitting: Sports Medicine

## 2023-06-13 DIAGNOSIS — R29898 Other symptoms and signs involving the musculoskeletal system: Secondary | ICD-10-CM | POA: Insufficient documentation

## 2023-06-13 DIAGNOSIS — M5441 Lumbago with sciatica, right side: Secondary | ICD-10-CM | POA: Diagnosis present

## 2023-06-13 DIAGNOSIS — Z981 Arthrodesis status: Secondary | ICD-10-CM | POA: Insufficient documentation

## 2023-06-13 DIAGNOSIS — G8929 Other chronic pain: Secondary | ICD-10-CM | POA: Insufficient documentation

## 2023-06-13 MED ORDER — LIDOCAINE HCL (PF) 1 % IJ SOLN
5.0000 mL | Freq: Once | INTRAMUSCULAR | Status: AC
  Administered 2023-06-13: 5 mL via INTRADERMAL

## 2023-06-13 MED ORDER — IOHEXOL 180 MG/ML  SOLN
20.0000 mL | Freq: Once | INTRAMUSCULAR | Status: AC | PRN
  Administered 2023-06-13: 15 mL via INTRATHECAL

## 2023-06-13 MED ORDER — ACETAMINOPHEN 325 MG PO TABS
650.0000 mg | ORAL_TABLET | Freq: Once | ORAL | Status: DC | PRN
Start: 2023-06-13 — End: 2023-06-14

## 2023-06-13 NOTE — Discharge Instructions (Signed)
Myelogram and Lumbar Puncture Discharge Instructions  Go home and rest quietly for the next 24 hours.  It is important to lie flat for the next 24 hours.  Get up only to go to the restroom.  You may lie in the bed or on a couch on your back, your stomach, your left side or your right side.  You may have one pillow under your head.  You may have pillows between your knees while you are on your side or under your knees while you are on your back.  DO NOT drive today.  Recline the seat as far back as it will go, while still wearing your seat belt, on the way home.  You may get up to go to the bathroom as needed.  You may sit up for 10 minutes to eat.  You may resume your normal diet and medications unless otherwise indicated.  The incidence of headache, nausea, or vomiting is about 5% (one in 20 patients).  If you develop a headache, lie flat and drink plenty of fluids until the headache goes away.  Caffeinated beverages may be helpful.  If you develop severe nausea and vomiting or a headache that does not go away with flat bed rest, call [phone].  You may resume normal activities after your 24 hours of bed rest is over; however, do not exert yourself strongly or do any heavy lifting tomorrow.  Call your physician for a follow-up appointment.  The results of your myelogram will be sent directly to your physician by the following day.  Discharge instructions have been explained to the patient.  The patient, or the person responsible for the patient, fully understands these instructions.   

## 2023-06-17 NOTE — Progress Notes (Signed)
 Remote ICD transmission.

## 2023-06-18 NOTE — Progress Notes (Unsigned)
 [name] D.[facility] [address] [place] [address] Phone: [phone]   Assessment and Plan:     There are no diagnoses linked to this encounter.  ***   Pertinent previous records reviewed include ***    Follow Up: ***     Subjective:   I, [name], am serving as a Neurosurgeon for Doctor Richardean Sale   Chief Complaint: piriformis pain    HPI:    05/07/2023 Patient is a 89 year old female referred to our office with piriformis/gluteal pain. Patient states has had pain for a few years. States pain goes down the legs to the ankle .  Right sided numbness and tingling.. Tylenol for the pain and it doesn't help. Laying with her feet elevated no pain. Pain when walking she does use a cane. When she extends her legs bilat she feels the pain along her low back/piriformis area.   06/19/2023 Patient states     Relevant Historical Information: CHF, hypertension, history of ICD, history of breast cancer, bipolar 2  Additional pertinent review of systems negative.   Current Outpatient Medications:    albuterol (VENTOLIN HFA) 108 (90 Base) MCG/ACT inhaler, Inhale 2 puffs into the lungs every 6 (six) hours as needed for wheezing or shortness of breath., Disp: 8 g, Rfl: 11   ascorbic acid (VITAMIN C) 500 MG tablet, Take 500 mg by mouth daily., Disp: , Rfl:    buPROPion (WELLBUTRIN XL) 150 MG 24 hr tablet, Take 1 tablet (150 mg total) by mouth daily., Disp: , Rfl:    carvedilol (COREG) 3.125 MG tablet, TAKE 1 TABLET BY MOUTH TWICE  DAILY, Disp: 180 tablet, Rfl: 3   Cholecalciferol (VITAMIN D3) 50 MCG (2000 UT) TABS, Take 2,000 Units by mouth daily. , Disp: , Rfl:    Clobetasol Prop Emollient Base (CLOBETASOL PROPIONATE E) 0.05 % emollient cream, Apply 1 Application topically at bedtime., Disp: 30 g, Rfl: 11   diclofenac Sodium (PENNSAID) 2 % SOLN, APPLY 2 PUMPS (40MG ) UP TO TWO PAINFUL AREAS TWICE A DAY, Disp: 224 g, Rfl: 0    diphenhydrAMINE (BENADRYL) 50 MG tablet, 50 mg oral diphenhydramine administered one hour prior to contrast administration., Disp: 1 tablet, Rfl: 0   DULoxetine (CYMBALTA) 60 MG capsule, Take 60 mg by mouth daily., Disp: , Rfl:    estradiol (ESTRACE) 0.1 MG/GM vaginal cream, Place 0.5 g vaginally 2 (two) times a week. Place 0.5g nightly for two weeks then twice a week after, Disp: 30 g, Rfl: 11   famotidine (PEPCID) 10 MG tablet, Take 10 mg by mouth daily as needed for heartburn or indigestion., Disp: , Rfl:    furosemide (LASIX) 20 MG tablet, Take 1 tablet (20 mg total) by mouth daily as needed., Disp: 90 tablet, Rfl: 3   levothyroxine (SYNTHROID) 88 MCG tablet, Take 1 tablet (88 mcg total) by mouth daily., Disp: 90 tablet, Rfl: 3   lidocaine (XYLOCAINE) 5 % ointment, Apply 1 Application topically as needed (up to 4x/day as needed)., Disp: 50 g, Rfl: 5   Lidocaine 4 % PTCH, Apply 1 patch topically every 12 (twelve) hours. Apply to back, Disp: , Rfl:    lidocaine-prilocaine (EMLA) cream, Apply 1 Application topically as needed., Disp: 30 g, Rfl: 0   lubiprostone (AMITIZA) 8 MCG capsule, TAKE ONE CAPSULE BY MOUTH TWICE DAILY WITH A MEAL, Disp: 180 capsule, Rfl: 3   Magnesium 250 MG TABS, Take 250 mg by mouth daily., Disp: , Rfl:  metaxalone (SKELAXIN) 800 MG tablet, Take 0.5 tablets (400 mg total) by mouth 3 (three) times daily as needed for muscle spasms., Disp: 60 tablet, Rfl: 5   Misc Natural Products (PUMPKIN SEED OIL) CAPS, Take 1 capsule by mouth daily., Disp: 180 capsule, Rfl: 0   NALTREXONE HCL, PAIN, PO, Take 4 mg by mouth at bedtime., Disp: , Rfl:    NONFORMULARY OR COMPOUNDED ITEM, Apply 120 Tubes topically daily. Diclofenac/Cyclobenzaprine/lamotrigine/lidocaine/prilocaine (16109) 2%/2%/6%/5%/1.25% cream QTY: 120 GM SIG: (NEURO) APPLY 1-2 PUMPS (1-2 GMS) TO AFFECTED AREA (S) OR FOCAL POINTS 3 TO 4 TIMES DAILY. RUB IN FOR 2 MINUTES TO ACHIEVE MAX PENETRATION. WASH HANDS WELL., Disp: 1 each,  Rfl: 2   Omega-3 Fatty Acids (FISH OIL) 1200 MG CAPS, Take 1,200 mg by mouth daily., Disp: , Rfl:    omeprazole (PRILOSEC) 40 MG capsule, TAKE 1 CAPSULE BY MOUTH DAILY, Disp: 90 capsule, Rfl: 3   Polyethyl Glycol-Propyl Glycol (SYSTANE) 0.4-0.3 % SOLN, Place 1 drop into both eyes at bedtime., Disp: , Rfl:    POLYETHYLENE GLYCOL 3350 PO, Take 1 Capful by mouth daily., Disp: , Rfl:    predniSONE (DELTASONE) 50 MG tablet, three doses of 50 mg oral prednisone administered 13, 7, and 1 hour prior to contrast administration, Disp: 3 tablet, Rfl: 0   pregabalin (LYRICA) 25 MG capsule, Take 1 capsule (25 mg total) by mouth 2 (two) times daily., Disp: 60 capsule, Rfl: 5   senna-docusate (SENOKOT S) 8.6-50 MG tablet, Take 2 tablets by mouth daily., Disp: 60 tablet, Rfl: 5   spironolactone (ALDACTONE) 25 MG tablet, TAKE 1 TABLET BY MOUTH IN THE  MORNING, Disp: 90 tablet, Rfl: 1   telmisartan (MICARDIS) 20 MG tablet, Take 1 tablet (20 mg total) by mouth daily., Disp: 90 tablet, Rfl: 1   Trospium Chloride 60 MG CP24, Take 1 capsule (60 mg total) by mouth daily., Disp: 30 capsule, Rfl: 3   Turmeric (QC TUMERIC COMPLEX PO), Take 1,000 mg by mouth daily., Disp: , Rfl:    Vitamin E 400 units TABS, Take 400 Units by mouth daily. , Disp: , Rfl:   Current Facility-Administered Medications:    [START ON 11/28/2023] denosumab (PROLIA) injection 60 mg, 60 mg, Subcutaneous, Q6 months, Eubanks, Janene Harvey, NP   Objective:     There were no vitals filed for this visit.    There is no height or weight on file to calculate BMI.    Physical Exam:    ***   Electronically signed by:  [name] [facility] 7:33 AM 06/18/23

## 2023-06-19 ENCOUNTER — Ambulatory Visit: Admitting: Sports Medicine

## 2023-06-19 VITALS — BP 130/80 | HR 75 | Ht <= 58 in | Wt 120.0 lb

## 2023-06-19 DIAGNOSIS — G8929 Other chronic pain: Secondary | ICD-10-CM | POA: Diagnosis not present

## 2023-06-19 DIAGNOSIS — Z981 Arthrodesis status: Secondary | ICD-10-CM

## 2023-06-19 DIAGNOSIS — M51362 Other intervertebral disc degeneration, lumbar region with discogenic back pain and lower extremity pain: Secondary | ICD-10-CM | POA: Diagnosis not present

## 2023-06-19 DIAGNOSIS — R29898 Other symptoms and signs involving the musculoskeletal system: Secondary | ICD-10-CM

## 2023-06-19 NOTE — Patient Instructions (Signed)
 Wait until you get a call to schedule epidural  Please mention you iodine allergy and ask if you will needed preparation Tylenol [phone] mg 2-3 times a day for pain relief  Follow up 2 weeks after epidural to discuss results

## 2023-06-20 ENCOUNTER — Other Ambulatory Visit: Payer: Self-pay | Admitting: Sports Medicine

## 2023-06-20 NOTE — Addendum Note (Signed)
 Addended by: Jerene Canny R on: 06/20/2023 12:16 PM   Modules accepted: Orders

## 2023-06-20 NOTE — Addendum Note (Signed)
 Addended by: Jerene Canny R on: 06/20/2023 07:55 AM   Modules accepted: Orders

## 2023-06-21 ENCOUNTER — Encounter: Payer: Self-pay | Admitting: Sports Medicine

## 2023-06-25 ENCOUNTER — Telehealth (HOSPITAL_COMMUNITY): Payer: Self-pay

## 2023-06-25 ENCOUNTER — Other Ambulatory Visit: Payer: Self-pay | Admitting: Radiology

## 2023-06-25 ENCOUNTER — Encounter: Payer: Self-pay | Admitting: Obstetrics and Gynecology

## 2023-06-25 ENCOUNTER — Ambulatory Visit (INDEPENDENT_AMBULATORY_CARE_PROVIDER_SITE_OTHER): Payer: Medicare Other | Admitting: Obstetrics and Gynecology

## 2023-06-25 VITALS — BP 123/79 | HR 75

## 2023-06-25 DIAGNOSIS — N393 Stress incontinence (female) (male): Secondary | ICD-10-CM

## 2023-06-25 DIAGNOSIS — Z888 Allergy status to other drugs, medicaments and biological substances status: Secondary | ICD-10-CM

## 2023-06-25 DIAGNOSIS — B009 Herpesviral infection, unspecified: Secondary | ICD-10-CM

## 2023-06-25 DIAGNOSIS — N3281 Overactive bladder: Secondary | ICD-10-CM

## 2023-06-25 DIAGNOSIS — L900 Lichen sclerosus et atrophicus: Secondary | ICD-10-CM | POA: Diagnosis not present

## 2023-06-25 MED ORDER — DIPHENHYDRAMINE HCL 50 MG PO TABS: ORAL_TABLET | ORAL | 0 refills | Status: AC

## 2023-06-25 MED ORDER — PREDNISONE 50 MG PO TABS: ORAL_TABLET | ORAL | 0 refills | Status: DC

## 2023-06-25 NOTE — Telephone Encounter (Signed)
-----   Message from Durwin Glaze sent at 06/25/2023 12:54 PM EDT ----- Regarding: RE: Epidural injection Sure ----- Message ----- From: Sharee Pimple Sent: 06/25/2023  12:06 PM EDT To: Oley Balm, MD Subject: Epidural injection                             Deanne Coffer,   Just received a call from pt's daughter to get her scheduled for an epidural injection. She can't be done at the clinic due to her iodine allergy.   Procedure: bilateral epidural  L3-L4  Dx: Chronic bilateral low back pain with bilateral sciatica   Ordering: Dr. Richardean Sale [phone]  Imaging: There is a CT lumbar in epic done on 06/13/23  They want to schedule on Friday. Let me know if this is ok?  Thanks,  Fara Boros

## 2023-06-25 NOTE — Progress Notes (Signed)
 [name]  SUBJECTIVE  History of Present Illness: [name] is a 89 y.o. female seen in follow-up for mixed incontinence and prolapse. Currently has a #3 ring with support pessary (last cleaned dec 2025).  Had second urethral bulking on 05/16/23. She does feel this has helped with her leakage with cough/ sneeze but it still having significant leakage with urgency on the way to the bathroom. She is tired of being wet all the time and it is affecting her quality of life.  Has been on trospium and pumpkin seed oil for OAB. She has tried Programme researcher, broadcasting/film/video, Medical illustrator for OAB. Has a pacemaker and not eligible for PTNS. Previously not want another procedure (discussed botox and SNM).   Biopsy of lesion on the vulva/ clitoral hood showed lichen sclerosus and was positive for HSV-1. She was treated with valtrex x 10 days and prescribed clobetasol ointment. She has noticed improvement in that area and less irritation. The lesions are now gone.   Past Medical History: Patient  has a past medical history of Arthritis, Cancer (HCC), Cataracts, bilateral, CHF (congestive heart failure) (HCC), Complication of anesthesia, Depression, Dyslipidemia, Fainted (04/21/06), GERD (gastroesophageal reflux disease), Headache(784.0), Hearing loss, HLD (hyperlipidemia), Hypertension, Hypothyroidism, ICD (implantable cardiac defibrillator) in place, ICD (implantable cardiac defibrillator), biventricular, in situ, LBBB (left bundle branch block), Memory loss, Nonischemic cardiomyopathy (HCC), Normal coronary arteries, Pacemaker, Syncope, Systolic CHF (HCC), Vertigo, and Wears glasses.   Past Surgical History: She  has a past surgical history that includes Total knee arthroplasty (05/17/01); Lumbar fusion (2006); Mastectomy, partial (2008); Breast surgery (2000); Joint replacement (06/14/01); Pacemaker insertion (04/23/06); Back surgery; Cardiac catheterization; Mass excision (11/08/2011);  implantable cardioverter defibrillator generator change (N/A, 12/18/2012); Cataract extraction; Eye surgery; Total knee arthroplasty (Left, 11/29/2014); Breast lumpectomy (Left, 2008); Colonoscopy; Mastectomy w/ sentinel node biopsy (Left, 06/04/2019); and BIV ICD GENERATOR CHANGEOUT (N/A, 02/06/2023).   Medications: She has a current medication list which includes the following prescription(s): albuterol, ascorbic acid, bupropion, carvedilol, vitamin d3, clobetasol propionate e, diclofenac sodium, diphenhydramine, duloxetine, estradiol, famotidine, furosemide, levothyroxine, lidocaine, lidocaine, lidocaine-prilocaine, lubiprostone, magnesium, metaxalone, pumpkin seed oil, naltrexone hcl (pain), NONFORMULARY OR COMPOUNDED ITEM, fish oil, omeprazole, systane, polyethylene glycol 3350, prednisone, pregabalin, senna-docusate, spironolactone, telmisartan, trospium chloride, turmeric, and vitamin e, and the following Facility-Administered Medications: [START ON 11/28/2023] denosumab.   Allergies: Patient is allergic to iodinated contrast media, iodine, shellfish allergy, memantine, aspirin, and codeine.   Social History: Patient  reports that she has never smoked. She has never been exposed to tobacco smoke. She has never used smokeless tobacco. She reports that she does not drink alcohol and does not use drugs.    OBJECTIVE     Physical Exam: Vitals:   06/25/23 0923  BP: 123/79  Pulse: 75   Gen: No apparent distress, A&O x 3.  Detailed Urogynecologic Evaluation:  Normal tissue over clitoral hood and vulva, no remaining white lesions present. Pessary removed and cleaned. Speculum exam shows normal mucosa with no lesions present. Pessary replaced.    Prior exam showed:  POP-Q (05/23/22)   1.5                                            Aa   1.5  Ba   -4                                              C    4                                            Gh    3                                            Pb   7                                            tvl    -2                                            Ap   -2                                            Bp   -4.5                                              D       ASSESSMENT AND PLAN    [name] is a 89 y.o. with:  1. Overactive bladder   2. SUI (stress urinary incontinence, female)   3. Lichen sclerosus   4. HSV-1 (herpes simplex virus 1) infection     - Some improvement of SUI symptoms - Continue pessary, plan for next cleaning in 3 months.  - Discussed option of Botox for OAB. She wants to avoid any anesthesia so this would rule out SNM. She is more open to trying the botox. We discussed possible need for self cath/ catheter with approx 10% risk of urinary retention. Also reviewed risks of pain, bleeding and infection with procedure. Will have her return for self cath teaching with her daughter.  - continue estrogen cream 2x week and clobetasol 2 x a week. If she notices lesions again then may need more suppressive therapy for HSV.   Marguerita Beards, MD

## 2023-06-25 NOTE — Patient Instructions (Signed)
 Decrease clobetasol cream to twice a week

## 2023-06-25 NOTE — Telephone Encounter (Signed)
-----   Message from Brayton El sent at 06/25/2023  3:47 PM EDT ----- Regarding: RE: Epidural injection Done ----- Message ----- From: Sharee Pimple Sent: 06/25/2023   1:05 PM EDT To: Brayton El, PA-C; Rosalita Levan, PA Subject: FW: Epidural injection                         Hi,   This pt is scheduled Friday for an epidural injection with [facility]. Can you please call in pre-meds to [facility] listed in her chart for her iodine allergy?  Thanks,  Fara Boros ----- Message ----- From: Oley Balm, MD Sent: 06/25/2023  12:54 PM EDT To: Sharee Pimple Subject: RE: Epidural injection                         Sure ----- Message ----- From: Sharee Pimple Sent: 06/25/2023  12:06 PM EDT To: Oley Balm, MD Subject: Epidural injection                             Deanne Coffer,   Just received a call from pt's daughter to get her scheduled for an epidural injection. She can't be done at the clinic due to her iodine allergy.   Procedure: bilateral epidural  L3-L4  Dx: Chronic bilateral low back pain with bilateral sciatica   Ordering: Dr. Richardean Sale [phone]  Imaging: There is a CT lumbar in epic done on 06/13/23  They want to schedule on Friday. Let me know if this is ok?  Thanks,  Fara Boros

## 2023-06-26 ENCOUNTER — Encounter: Payer: Self-pay | Admitting: Nurse Practitioner

## 2023-06-26 ENCOUNTER — Other Ambulatory Visit: Payer: Self-pay | Admitting: Nurse Practitioner

## 2023-06-26 ENCOUNTER — Encounter: Payer: Self-pay | Admitting: Pulmonary Disease

## 2023-06-26 ENCOUNTER — Ambulatory Visit: Payer: Medicare Other | Admitting: Pulmonary Disease

## 2023-06-26 VITALS — BP 149/85 | HR 77 | Ht <= 58 in | Wt 120.4 lb

## 2023-06-26 DIAGNOSIS — J849 Interstitial pulmonary disease, unspecified: Secondary | ICD-10-CM | POA: Diagnosis not present

## 2023-06-26 DIAGNOSIS — R1314 Dysphagia, pharyngoesophageal phase: Secondary | ICD-10-CM

## 2023-06-26 DIAGNOSIS — E039 Hypothyroidism, unspecified: Secondary | ICD-10-CM

## 2023-06-26 DIAGNOSIS — I10 Essential (primary) hypertension: Secondary | ICD-10-CM

## 2023-06-26 LAB — SEDIMENTATION RATE: Sed Rate: 30 mm/h (ref 0–30)

## 2023-06-26 NOTE — Telephone Encounter (Signed)
Forwarding message to referral coordinator to follow up

## 2023-06-26 NOTE — Telephone Encounter (Signed)
 High risk warning populated when attempting to refill medication  Please review and approval if appropriate   Thanks,  [facility] W/CMA

## 2023-06-26 NOTE — Patient Instructions (Signed)
 We will refer you to our speech therapy team for further evaluation of your trouble swallowing  We will check inflammatory labs today to evaluate for interstitial lung disease  Please complete the interstitial lung disease packet and bring back to the follow up visit  We will discuss your case at the interstitial lung disease conference  Follow up in 1 month to review lab results and conference discussion

## 2023-06-26 NOTE — Progress Notes (Unsigned)
 Synopsis: Referred in May 2022 for abnormal CT chest results  Subjective:   PATIENT ID: [name] GENDER: female DOB: 14-Apr-1933, MRN: [id number]  HPI  Chief Complaint  Patient presents with   Follow-up   [name] is an 89 year old woman, never smoker with HFrEF, dementia, hypertension and osteoarthritis who returns to pulmonary clinic for abnormal lung findings on the lower lung fields after CT renal stone study on 06/08/20.    OV 01/19/2021 We obtained a HRCT chest scan 09/09/20 which showed extensive clustered, confluent, somewhat ill-defined nodularity throughout the lungs, many nodules concentrated along the fissures with upper lobe predominance. These findings are consistent with sarcoidosis with a mild component of fibrosis.  Pulmonary function tests show mild restrictive and mild diffusion defects.  She denies any issues with shortness of breath.   OV 08/16/20 There were bilateral scattered ground glass opacities noted on the CT renal stone study. She denies any shortness of breath or wheezing. She does have an intermittent cough. She reports trouble with her swallowing in which she had barium swallow completed on 07/27/20 that showed mild aspiration risk with minimal pharyngoesophageal dysphagia. She denies recurrent pneumonias.   She does report diffuse joint pains since she was in her 32s. She reports this is osteoarthritis. She does have a history of GERD and is taking omeprazole daily. She denies frequent reflux symptoms and does not have nocturnal symptoms that wake her up from sleep.  Past Medical History:  Diagnosis Date   Arthritis    Cancer (HCC)    left breast cancer    Cataracts, bilateral    removed by surgery   CHF (congestive heart failure) (HCC)    PACEMAKER & DEFIB   Complication of anesthesia    hypotensive after back surgery in 2006   Depression    Dyslipidemia    Fainted 04/21/06   AT [facility]   GERD (gastroesophageal reflux disease)     Headache(784.0)    Hearing loss    bilateral hearing aids   HLD (hyperlipidemia)    diet controlled    Hypertension    Hypothyroidism    ICD (implantable cardiac defibrillator) in place    pt has pacer/icd   ICD (implantable cardiac defibrillator), biventricular, in situ    LBBB (left bundle branch block)    Memory loss    Nonischemic cardiomyopathy (HCC)    Normal coronary arteries    s/p cardiac cath 2007   Pacemaker    ICD [employer]   Syncope    Systolic CHF [facility])    Vertigo    Wears glasses      Family History  Problem Relation Age of Onset   Hypertension Mother    Arthritis Mother    Hypertension Father    Hypertension Brother    Hypertension Brother      Social History   Socioeconomic History   Marital status: Married    Spouse name: Not on file   Number of children: 1   Years of education: Masters   Highest education level: Master's degree (e.g., MA, MS, MEng, MEd, MSW, MBA)  Occupational History   Occupation: Retired  Tobacco Use   Smoking status: Never    Passive exposure: Never   Smokeless tobacco: Never  Vaping Use   Vaping status: Never Used  Substance and Sexual Activity   Alcohol use: No   Drug use: No   Sexual activity: Not Currently    Birth control/protection: Post-menopausal  Other Topics Concern  Not on file  Social History Narrative   Lives at home with husband.   Right-handed.      As of 07/28/2014   Diet: No special diet   Caffeine: yes, Chocolate, tea and sodas    Married: YES, 1970   House: Yes, 2 stories, 2-3 persons live in home   Pets: No   Current/Past profession: Nurse, mental health, Publishing rights manager    Exercise: Yes 2-3 x weekly   Living Will: Yes   DNR: No   POA/HPOA: No      Social Drivers of Corporate investment banker Strain: Low Risk  (06/06/2023)   Overall Financial Resource Strain (CARDIA)    Difficulty of Paying Living Expenses: Not hard at all  Food Insecurity: No Food Insecurity (06/06/2023)    Hunger Vital Sign    Worried About Running Out of Food in the Last Year: Never true    Ran Out of Food in the Last Year: Never true  Transportation Needs: No Transportation Needs (06/06/2023)   PRAPARE - Administrator, Civil Service (Medical): No    Lack of Transportation (Non-Medical): No  Physical Activity: Unknown (06/06/2023)   Exercise Vital Sign    Days of Exercise per Week: Patient declined    Minutes of Exercise per Session: Not on file  Stress: Patient Declined (06/06/2023)   [employer] of Occupational Health - Occupational Stress Questionnaire    Feeling of Stress : Patient declined  Social Connections: Moderately Integrated (06/06/2023)   Social Connection and Isolation Panel [NHANES]    Frequency of Communication with Friends and Family: Three times a week    Frequency of Social Gatherings with Friends and Family: Patient declined    Attends Religious Services: More than 4 times per year    Active Member of [employer] or Organizations: Yes    Attends Banker Meetings: More than 4 times per year    Marital Status: Widowed  Intimate Partner Violence: Not At Risk (03/27/2017)   Humiliation, Afraid, Rape, and Kick questionnaire    Fear of Current or Ex-Partner: No    Emotionally Abused: No    Physically Abused: No    Sexually Abused: No     Allergies  Allergen Reactions   Iodinated Contrast Media Shortness Of Breath    Requires pre-medication   Iodine Shortness Of Breath    Iodine contrast, CHF , SOB   Shellfish Allergy Shortness Of Breath   Memantine     Malaise, fogginess/ couldn't think   Aspirin Nausea And Vomiting   Codeine Nausea And Vomiting and Other (See Comments)    Other Reaction(s): Not available     Outpatient Medications Prior to Visit  Medication Sig Dispense Refill   albuterol (VENTOLIN HFA) 108 (90 Base) MCG/ACT inhaler Inhale 2 puffs into the lungs every 6 (six) hours as needed for wheezing or shortness of breath. 8 g 11    ascorbic acid (VITAMIN C) 500 MG tablet Take 500 mg by mouth daily.     buPROPion (WELLBUTRIN XL) 150 MG 24 hr tablet Take 1 tablet (150 mg total) by mouth daily.     carvedilol (COREG) 3.125 MG tablet TAKE 1 TABLET BY MOUTH TWICE  DAILY 180 tablet 3   Cholecalciferol (VITAMIN D3) 50 MCG (2000 UT) TABS Take 2,000 Units by mouth daily.      Clobetasol Prop Emollient Base (CLOBETASOL PROPIONATE E) 0.05 % emollient cream Apply 1 Application topically at bedtime. 30 g 11   diclofenac Sodium (  PENNSAID) 2 % SOLN APPLY 2 PUMPS (40MG ) UP TO TWO PAINFUL AREAS TWICE A DAY 224 g 0   diphenhydrAMINE (BENADRYL) 50 MG tablet 50 mg oral diphenhydramine administered one hour prior to contrast administration. 1 tablet 0   DULoxetine (CYMBALTA) 60 MG capsule Take 60 mg by mouth daily.     estradiol (ESTRACE) 0.1 MG/GM vaginal cream Place 0.5 g vaginally 2 (two) times a week. Place 0.5g nightly for two weeks then twice a week after 30 g 11   famotidine (PEPCID) 10 MG tablet Take 10 mg by mouth daily as needed for heartburn or indigestion.     furosemide (LASIX) 20 MG tablet Take 1 tablet (20 mg total) by mouth daily as needed. 90 tablet 3   levothyroxine (SYNTHROID) 88 MCG tablet Take 1 tablet (88 mcg total) by mouth daily. 90 tablet 3   lidocaine (XYLOCAINE) 5 % ointment Apply 1 Application topically as needed (up to 4x/day as needed). 50 g 5   Lidocaine 4 % PTCH Apply 1 patch topically every 12 (twelve) hours. Apply to back     lidocaine-prilocaine (EMLA) cream Apply 1 Application topically as needed. 30 g 0   lubiprostone (AMITIZA) 8 MCG capsule TAKE ONE CAPSULE BY MOUTH TWICE DAILY WITH A MEAL 180 capsule 3   Magnesium 250 MG TABS Take 250 mg by mouth daily.     metaxalone (SKELAXIN) 800 MG tablet Take 0.5 tablets (400 mg total) by mouth 3 (three) times daily as needed for muscle spasms. 60 tablet 5   Misc Natural Products (PUMPKIN SEED OIL) CAPS Take 1 capsule by mouth daily. 180 capsule 0   NALTREXONE HCL,  PAIN, PO Take 4 mg by mouth at bedtime.     NONFORMULARY OR COMPOUNDED ITEM Apply 120 Tubes topically daily. Diclofenac/Cyclobenzaprine/lamotrigine/lidocaine/prilocaine (09604) 2%/2%/6%/5%/1.25% cream QTY: 120 GM SIG: (NEURO) APPLY 1-2 PUMPS (1-2 GMS) TO AFFECTED AREA (S) OR FOCAL POINTS 3 TO 4 TIMES DAILY. RUB IN FOR 2 MINUTES TO ACHIEVE MAX PENETRATION. WASH HANDS WELL. 1 each 2   Omega-3 Fatty Acids (FISH OIL) 1200 MG CAPS Take 1,200 mg by mouth daily.     omeprazole (PRILOSEC) 40 MG capsule TAKE 1 CAPSULE BY MOUTH DAILY 90 capsule 3   Polyethyl Glycol-Propyl Glycol (SYSTANE) 0.4-0.3 % SOLN Place 1 drop into both eyes at bedtime.     POLYETHYLENE GLYCOL 3350 PO Take 1 Capful by mouth daily.     predniSONE (DELTASONE) 50 MG tablet three doses of 50 mg oral prednisone administered 13, 7, and 1 hour prior to contrast administration 3 tablet 0   pregabalin (LYRICA) 25 MG capsule Take 1 capsule (25 mg total) by mouth 2 (two) times daily. 60 capsule 5   senna-docusate (SENOKOT S) 8.6-50 MG tablet Take 2 tablets by mouth daily. 60 tablet 5   spironolactone (ALDACTONE) 25 MG tablet TAKE 1 TABLET BY MOUTH IN THE  MORNING 90 tablet 1   telmisartan (MICARDIS) 20 MG tablet Take 1 tablet (20 mg total) by mouth daily. 90 tablet 3   Trospium Chloride 60 MG CP24 Take 1 capsule (60 mg total) by mouth daily. 30 capsule 3   Turmeric (QC TUMERIC COMPLEX PO) Take 1,000 mg by mouth daily.     Vitamin E 400 units TABS Take 400 Units by mouth daily.      Facility-Administered Medications Prior to Visit  Medication Dose Route Frequency Provider Last Rate Last Admin   [START ON 11/28/2023] denosumab (PROLIA) injection 60 mg  60 mg Subcutaneous  Q6 months [name], NP       Review of Systems  Constitutional:  Negative for chills, fever, malaise/fatigue and weight loss.  HENT:  Negative for congestion, sinus pain and sore throat.   Eyes: Negative.   Respiratory:  Negative for cough, hemoptysis, sputum  production, shortness of breath and wheezing.   Cardiovascular:  Negative for chest pain, palpitations, orthopnea, claudication and leg swelling.  Gastrointestinal:  Negative for abdominal pain, heartburn, nausea and vomiting.  Genitourinary: Negative.   Musculoskeletal:  Positive for back pain and joint pain. Negative for myalgias.  Skin:  Negative for rash.  Neurological:  Negative for weakness.  Endo/Heme/Allergies: Negative.   Psychiatric/Behavioral: Negative.      Objective:   Vitals:   06/26/23 1328  BP: (!) 149/85  Pulse: 77  SpO2: 93%  Weight: 120 lb 6.4 oz (54.6 kg)  Height: 4\' 9"  (1.448 m)     Physical Exam Constitutional:      General: She is not in acute distress.    Appearance: Normal appearance. She is normal weight. She is not ill-appearing.     Comments: Elderly woman  HENT:     Head: Normocephalic and atraumatic.  Eyes:     General: No scleral icterus.    Conjunctiva/sclera: Conjunctivae normal.     Pupils: Pupils are equal, round, and reactive to light.  Cardiovascular:     Rate and Rhythm: Normal rate and regular rhythm.     Pulses: Normal pulses.     Heart sounds: Normal heart sounds. No murmur heard. Pulmonary:     Effort: Pulmonary effort is normal.     Breath sounds: Normal breath sounds. No wheezing, rhonchi or rales.  Musculoskeletal:     Right lower leg: No edema.     Left lower leg: No edema.  Skin:    General: Skin is warm and dry.  Neurological:     General: No focal deficit present.     Mental Status: She is alert.     CBC    Component Value Date/Time   WBC 7.3 05/27/2023 1110   RBC 4.12 05/27/2023 1110   HGB 11.8 05/27/2023 1110   HGB 12.5 01/23/2023 0956   HGB 12.5 08/24/2011 1332   HCT 37.0 05/27/2023 1110   HCT 38.9 01/23/2023 0956   HCT 37.5 08/24/2011 1332   PLT 142 05/27/2023 1110   PLT 128 (L) 01/23/2023 0956   MCV 89.8 05/27/2023 1110   MCV 92 01/23/2023 0956   MCV 87.1 08/24/2011 1332   MCH 28.6 05/27/2023  1110   MCHC 31.9 (L) 05/27/2023 1110   RDW 13.2 05/27/2023 1110   RDW 13.6 01/23/2023 0956   RDW 15.6 (H) 08/24/2011 1332   LYMPHSABS 1.8 01/16/2023 1130   LYMPHSABS 1.9 10/05/2015 0945   LYMPHSABS 2.5 08/24/2011 1332   MONOABS 0.5 01/16/2023 1130   MONOABS 0.6 08/24/2011 1332   EOSABS 212 05/27/2023 1110   EOSABS 0.3 10/05/2015 0945   BASOSABS 88 05/27/2023 1110   BASOSABS 0.0 10/05/2015 0945   BASOSABS 0.1 08/24/2011 1332      Latest Ref Rng & Units 05/27/2023   11:10 AM 01/23/2023    9:56 AM 01/16/2023   11:30 AM  BMP  Glucose 65 - 139 mg/dL 563  875  96   BUN 7 - 25 mg/dL 21  18  17    Creatinine 0.60 - 0.95 mg/dL 6.43  3.29  5.18   BUN/Creat Ratio 6 - 22 (calc) SEE NOTE:  22  Sodium 135 - 146 mmol/L 134  136  133   Potassium 3.5 - 5.3 mmol/L 4.6  4.6  4.4   Chloride 98 - 110 mmol/L 98  99  99   CO2 20 - 32 mmol/L 30  25  31    Calcium 8.6 - 10.4 mg/dL 16.1  09.6  04.5    Chest imaging: HRCT Chest 09/09/20 1. There is very extensive, clustered, confluent, somewhat ill-defined nodularity throughout the lungs, many nodules concentrated along the fissures and with a slight upper lobe predominance. There is some associated irregular peripheral interstitial opacity. 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 Unusual appearance and distribution of an atypical infection is a differential consideration. 2. Coronary artery disease.  CT Renal Stone Study 06/09/31 Lower chest: Bilateral ground-glass opacities peripherally within both lower lungs. Heart is normal size. Pacer wires noted in the heart. No effusions.  PFT:    Latest Ref Rng & Units 01/19/2021   11:44 AM  PFT Results  FVC-Pre L 1.90   FVC-Predicted Pre % 142   FVC-Post L 1.93   FVC-Predicted Post % 144   Pre FEV1/FVC % % 88   Post FEV1/FCV % % 91   FEV1-Pre L 1.67   FEV1-Predicted Pre % 164   FEV1-Post L 1.75   DLCO uncorrected ml/min/mmHg 11.26   DLCO UNC% % 72   DLCO  corrected ml/min/mmHg 11.48   DLCO COR %Predicted % 73   DLVA Predicted % 87   TLC L 3.36   TLC % Predicted % 78   RV % Predicted % 67     Labs:  Path:  Echo 08/26/13: EF 45-50%, mild global hypokinesis. Incoordinate septal motion.    ICD wires noted. Diastolic dysfunction with normal LV filling    pressure.  Assessment & Plan:   No diagnosis found.  Discussion: Rechy Bost is an 89 year old woman, never smoker with HFrEF, dementia, hypertension and osteoarthritis who returns to pulmonary clinic for abnormal lung findings on the lower lung fields after CT renal stone study on 06/08/20.  Her CT Chest findings are most consistent with sarcoidosis which has likely been present for sometime prior to discovering these findings through her abdominal imaging. She has findings of mild fibrosis as well which could be from history of chronic dysphagia and aspiration.  Her pulmonary function tests show mild restriction and diffusion defect. She is asymptomatic at this time given her level of functioning. We will continue to monitor her symptoms. She can try albuterol as needed for cough or shortness of breath.   She is to follow up in 1 year.   Melody Comas, MD [facility] Office: [phone]   Current Outpatient Medications:    albuterol (VENTOLIN HFA) 108 (90 Base) MCG/ACT inhaler, Inhale 2 puffs into the lungs every 6 (six) hours as needed for wheezing or shortness of breath., Disp: 8 g, Rfl: 11   ascorbic acid (VITAMIN C) 500 MG tablet, Take 500 mg by mouth daily., Disp: , Rfl:    buPROPion (WELLBUTRIN XL) 150 MG 24 hr tablet, Take 1 tablet (150 mg total) by mouth daily., Disp: , Rfl:    carvedilol (COREG) 3.125 MG tablet, TAKE 1 TABLET BY MOUTH TWICE  DAILY, Disp: 180 tablet, Rfl: 3   Cholecalciferol (VITAMIN D3) 50 MCG (2000 UT) TABS, Take 2,000 Units by mouth daily. , Disp: , Rfl:    Clobetasol Prop Emollient Base (CLOBETASOL PROPIONATE E) 0.05 %  emollient cream, Apply  1 Application topically at bedtime., Disp: 30 g, Rfl: 11   diclofenac Sodium (PENNSAID) 2 % SOLN, APPLY 2 PUMPS (40MG ) UP TO TWO PAINFUL AREAS TWICE A DAY, Disp: 224 g, Rfl: 0   diphenhydrAMINE (BENADRYL) 50 MG tablet, 50 mg oral diphenhydramine administered one hour prior to contrast administration., Disp: 1 tablet, Rfl: 0   DULoxetine (CYMBALTA) 60 MG capsule, Take 60 mg by mouth daily., Disp: , Rfl:    estradiol (ESTRACE) 0.1 MG/GM vaginal cream, Place 0.5 g vaginally 2 (two) times a week. Place 0.5g nightly for two weeks then twice a week after, Disp: 30 g, Rfl: 11   famotidine (PEPCID) 10 MG tablet, Take 10 mg by mouth daily as needed for heartburn or indigestion., Disp: , Rfl:    furosemide (LASIX) 20 MG tablet, Take 1 tablet (20 mg total) by mouth daily as needed., Disp: 90 tablet, Rfl: 3   levothyroxine (SYNTHROID) 88 MCG tablet, Take 1 tablet (88 mcg total) by mouth daily., Disp: 90 tablet, Rfl: 3   lidocaine (XYLOCAINE) 5 % ointment, Apply 1 Application topically as needed (up to 4x/day as needed)., Disp: 50 g, Rfl: 5   Lidocaine 4 % PTCH, Apply 1 patch topically every 12 (twelve) hours. Apply to back, Disp: , Rfl:    lidocaine-prilocaine (EMLA) cream, Apply 1 Application topically as needed., Disp: 30 g, Rfl: 0   lubiprostone (AMITIZA) 8 MCG capsule, TAKE ONE CAPSULE BY MOUTH TWICE DAILY WITH A MEAL, Disp: 180 capsule, Rfl: 3   Magnesium 250 MG TABS, Take 250 mg by mouth daily., Disp: , Rfl:    metaxalone (SKELAXIN) 800 MG tablet, Take 0.5 tablets (400 mg total) by mouth 3 (three) times daily as needed for muscle spasms., Disp: 60 tablet, Rfl: 5   Misc Natural Products (PUMPKIN SEED OIL) CAPS, Take 1 capsule by mouth daily., Disp: 180 capsule, Rfl: 0   NALTREXONE HCL, PAIN, PO, Take 4 mg by mouth at bedtime., Disp: , Rfl:    NONFORMULARY OR COMPOUNDED ITEM, Apply 120 Tubes topically daily. Diclofenac/Cyclobenzaprine/lamotrigine/lidocaine/prilocaine (51884)  2%/2%/6%/5%/1.25% cream QTY: 120 GM SIG: (NEURO) APPLY 1-2 PUMPS (1-2 GMS) TO AFFECTED AREA (S) OR FOCAL POINTS 3 TO 4 TIMES DAILY. RUB IN FOR 2 MINUTES TO ACHIEVE MAX PENETRATION. WASH HANDS WELL., Disp: 1 each, Rfl: 2   Omega-3 Fatty Acids (FISH OIL) 1200 MG CAPS, Take 1,200 mg by mouth daily., Disp: , Rfl:    omeprazole (PRILOSEC) 40 MG capsule, TAKE 1 CAPSULE BY MOUTH DAILY, Disp: 90 capsule, Rfl: 3   Polyethyl Glycol-Propyl Glycol (SYSTANE) 0.4-0.3 % SOLN, Place 1 drop into both eyes at bedtime., Disp: , Rfl:    POLYETHYLENE GLYCOL 3350 PO, Take 1 Capful by mouth daily., Disp: , Rfl:    predniSONE (DELTASONE) 50 MG tablet, three doses of 50 mg oral prednisone administered 13, 7, and 1 hour prior to contrast administration, Disp: 3 tablet, Rfl: 0   pregabalin (LYRICA) 25 MG capsule, Take 1 capsule (25 mg total) by mouth 2 (two) times daily., Disp: 60 capsule, Rfl: 5   senna-docusate (SENOKOT S) 8.6-50 MG tablet, Take 2 tablets by mouth daily., Disp: 60 tablet, Rfl: 5   spironolactone (ALDACTONE) 25 MG tablet, TAKE 1 TABLET BY MOUTH IN THE  MORNING, Disp: 90 tablet, Rfl: 1   telmisartan (MICARDIS) 20 MG tablet, Take 1 tablet (20 mg total) by mouth daily., Disp: 90 tablet, Rfl: 3   Trospium Chloride 60 MG CP24, Take 1 capsule (60 mg total) by mouth daily., Disp:  30 capsule, Rfl: 3   Turmeric (QC TUMERIC COMPLEX PO), Take 1,000 mg by mouth daily., Disp: , Rfl:    Vitamin E 400 units TABS, Take 400 Units by mouth daily. , Disp: , Rfl:   Current Facility-Administered Medications:    [START ON 11/28/2023] denosumab (PROLIA) injection 60 mg, 60 mg, Subcutaneous, Q6 months, [place], Janene Harvey, NP

## 2023-06-27 ENCOUNTER — Encounter: Payer: Self-pay | Admitting: Pulmonary Disease

## 2023-06-27 NOTE — Addendum Note (Signed)
 Addended by: Melody Comas on: 06/27/2023 09:42 AM   Modules accepted: Orders

## 2023-06-28 ENCOUNTER — Ambulatory Visit (HOSPITAL_COMMUNITY)
Admission: RE | Admit: 2023-06-28 | Discharge: 2023-06-28 | Disposition: A | Discharge status: Home / Self Care | Source: Ambulatory Visit | Attending: Sports Medicine | Admitting: Sports Medicine

## 2023-06-28 DIAGNOSIS — R29898 Other symptoms and signs involving the musculoskeletal system: Secondary | ICD-10-CM | POA: Insufficient documentation

## 2023-06-28 DIAGNOSIS — Z981 Arthrodesis status: Secondary | ICD-10-CM | POA: Insufficient documentation

## 2023-06-28 DIAGNOSIS — M48061 Spinal stenosis, lumbar region without neurogenic claudication: Secondary | ICD-10-CM | POA: Diagnosis not present

## 2023-06-28 DIAGNOSIS — G8929 Other chronic pain: Secondary | ICD-10-CM | POA: Diagnosis not present

## 2023-06-28 DIAGNOSIS — M5440 Lumbago with sciatica, unspecified side: Secondary | ICD-10-CM | POA: Insufficient documentation

## 2023-06-28 DIAGNOSIS — M51362 Other intervertebral disc degeneration, lumbar region with discogenic back pain and lower extremity pain: Secondary | ICD-10-CM | POA: Diagnosis not present

## 2023-06-28 HISTORY — PX: IR INJECT/THERA/INC NEEDLE/CATH/PLC EPI/LUMB/SAC W/IMG: IMG6130

## 2023-06-28 MED ORDER — LIDOCAINE HCL (PF) 1 % IJ SOLN
10.0000 mL | Freq: Once | INTRAMUSCULAR | Status: AC
Start: 2023-06-28 — End: 2023-06-28
  Administered 2023-06-28: 10 mL

## 2023-06-28 MED ORDER — IOHEXOL 180 MG/ML  SOLN
INTRAMUSCULAR | Status: AC
  Filled 2023-06-28: qty 10

## 2023-06-28 MED ORDER — LIDOCAINE HCL (PF) 1 % IJ SOLN
INTRAMUSCULAR | Status: AC
  Filled 2023-06-28: qty 30

## 2023-06-28 MED ORDER — METHYLPREDNISOLONE ACETATE 80 MG/ML IJ SUSP
INTRAMUSCULAR | Status: AC
  Filled 2023-06-28: qty 1

## 2023-06-28 MED ORDER — IOHEXOL 180 MG/ML  SOLN
10.0000 mL | Freq: Once | INTRAMUSCULAR | Status: AC | PRN
  Administered 2023-06-28: 5 mL via INTRATHECAL

## 2023-06-28 MED ORDER — METHYLPREDNISOLONE ACETATE 80 MG/ML IJ SUSP: 80.0000 mg | Freq: Once | INTRAMUSCULAR | Status: DC

## 2023-06-28 NOTE — Telephone Encounter (Signed)
 Called patient- let her know it was faxed to [facility] and provided ph# to schedule.

## 2023-06-28 NOTE — Procedures (Signed)
  Procedure:  LUMBAR epidural steroid injection L3/4 under fluoro Preprocedure diagnosis: Diagnoses of Chronic bilateral low back pain with bilateral sciatica, Right leg weakness, Left leg weakness, Degeneration of intervertebral disc of lumbar region with discogenic back pain and lower extremity pain, and History of lumbar fusion were pertinent to this visit. Postprocedure diagnosis: same EBL:    minimal Complications:   none immediate  See full dictation in [employer].  Thora Lance MD Main # [phone] Pager  [phone] Mobile [phone]

## 2023-07-01 LAB — HYPERSENSITIVITY PNEUMONITIS
A. Pullulans Abs: NEGATIVE
A.Fumigatus #1 Abs: NEGATIVE
Micropolyspora faeni, IgG: NEGATIVE
Pigeon Serum Abs: NEGATIVE
Thermoact. Saccharii: NEGATIVE
Thermoactinomyces vulgaris, IgG: NEGATIVE

## 2023-07-01 LAB — RNP ANTIBODIES: ENA RNP Ab: <0.2 AI (ref 0.0–0.9)

## 2023-07-02 LAB — ANGIOTENSIN CONVERTING ENZYME: Angiotensin-Converting Enzyme: 36 U/L (ref 9–67)

## 2023-07-02 LAB — SJOGREN'S SYNDROME ANTIBODS(SSA + SSB)
SSA (Ro) (ENA) Antibody, IgG: <1 AI
SSB (La) (ENA) Antibody, IgG: <1 AI

## 2023-07-02 LAB — ANTI-SCLERODERMA ANTIBODY: Scleroderma (Scl-70) (ENA) Antibody, IgG: <1 AI

## 2023-07-02 LAB — ANTI-SMITH ANTIBODY: ENA SM Ab Ser-aCnc: <1 AI

## 2023-07-02 LAB — ANTI-NUCLEAR AB-TITER (ANA TITER): ANA Titer 1: 1:320 {titer} — ABNORMAL HIGH

## 2023-07-02 LAB — ANA: Anti Nuclear Antibody (ANA): POSITIVE — AB

## 2023-07-02 LAB — ANCA SCREEN W REFLEX TITER: ANCA SCREEN: NEGATIVE

## 2023-07-02 LAB — ANTI-DNA ANTIBODY, DOUBLE-STRANDED: ds DNA Ab: 1 [IU]/mL

## 2023-07-02 LAB — CENTROMERE ANTIBODIES: Centromere Ab Screen: <1 AI

## 2023-07-02 LAB — RHEUMATOID FACTOR: Rheumatoid fact SerPl-aCnc: <10 [IU]/mL (ref <14)

## 2023-07-02 LAB — CYCLIC CITRUL PEPTIDE ANTIBODY, IGG: Cyclic Citrullin Peptide Ab: <16 U

## 2023-07-09 ENCOUNTER — Encounter: Payer: Self-pay | Admitting: Nurse Practitioner

## 2023-07-09 NOTE — Telephone Encounter (Signed)
 I left a voicemail to let the patient daughter know that [facility] will be able to do the bone destiny with the pacemaker but will have to [facility].   Message sent to [name], NP

## 2023-07-09 NOTE — Telephone Encounter (Signed)
 Spoke with the the daughter and she has agree for her mother to do her bone destiny at [facility].   Message sent to [name], NP

## 2023-07-09 NOTE — Telephone Encounter (Signed)
 Can we call [facility] and follow up on this- generally theres not an issue with pacemaker and bone density

## 2023-07-09 NOTE — Telephone Encounter (Signed)
 Copied from CRM [id number]. Topic: General - Call Back - No Documentation >> Jul 09, 2023 12:18 PM Irine Seal wrote: Reason for CRM: patient's daughter [name] returning missed call from [place], [place], [place] Regarding scheduling the bone density scan, called [facility] and spoke with Darlina Rumpf, who stated Toma Copier is out for lunch, advised sheryl and informed her she would get a call back  callback # [phone]

## 2023-07-09 NOTE — Telephone Encounter (Signed)
 Copied from CRM [id number]. Topic: General - Call Back - No Documentation >> Jul 09, 2023 12:18 PM Irine Seal wrote: Reason for CRM: patient's daughter [name] returning missed call from [place], [place], [place] Regarding scheduling the bone density scan, called [facility] and spoke with Darlina Rumpf, who stated Toma Copier is out for lunch, advised sheryl and informed her she would get a call back  callback # [phone].  Left message on voicemail for patient to return call when available

## 2023-07-16 ENCOUNTER — Ambulatory Visit: Attending: Pulmonary Disease

## 2023-07-16 DIAGNOSIS — R1314 Dysphagia, pharyngoesophageal phase: Secondary | ICD-10-CM | POA: Insufficient documentation

## 2023-07-16 DIAGNOSIS — M6281 Muscle weakness (generalized): Secondary | ICD-10-CM | POA: Insufficient documentation

## 2023-07-16 DIAGNOSIS — R262 Difficulty in walking, not elsewhere classified: Secondary | ICD-10-CM | POA: Insufficient documentation

## 2023-07-16 DIAGNOSIS — R1313 Dysphagia, pharyngeal phase: Secondary | ICD-10-CM | POA: Insufficient documentation

## 2023-07-16 DIAGNOSIS — M5459 Other low back pain: Secondary | ICD-10-CM | POA: Diagnosis present

## 2023-07-16 DIAGNOSIS — R252 Cramp and spasm: Secondary | ICD-10-CM | POA: Diagnosis present

## 2023-07-16 DIAGNOSIS — R293 Abnormal posture: Secondary | ICD-10-CM | POA: Insufficient documentation

## 2023-07-16 DIAGNOSIS — R41841 Cognitive communication deficit: Secondary | ICD-10-CM | POA: Insufficient documentation

## 2023-07-16 NOTE — Therapy (Signed)
 OUTPATIENT SPEECH LANGUAGE PATHOLOGY SWALLOW EVALUATION   Patient Name: [name] MRN: [id number] DOB:03-28-1934, 89 y.o., female Today's Date: 07/16/2023  PCP: Abbey Chatters, NP REFERRING PROVIDER: Martina Sinner., MD  END OF SESSION:   Past Medical History:  Diagnosis Date   Arthritis    Cancer [facility])    left breast cancer    Cataracts, bilateral    removed by surgery   CHF (congestive heart failure) (HCC)    PACEMAKER & DEFIB   Complication of anesthesia    hypotensive after back surgery in 2006   Depression    Dyslipidemia    Fainted 04/21/06   AT [facility]   GERD (gastroesophageal reflux disease)    Headache(784.0)    Hearing loss    bilateral hearing aids   HLD (hyperlipidemia)    diet controlled    Hypertension    Hypothyroidism    ICD (implantable cardiac defibrillator) in place    pt has pacer/icd   ICD (implantable cardiac defibrillator), biventricular, in situ    LBBB (left bundle branch block)    Memory loss    Nonischemic cardiomyopathy (HCC)    Normal coronary arteries    s/p cardiac cath 2007   Pacemaker    ICD [employer]   Syncope    Systolic CHF [facility])    Vertigo    Wears glasses    Past Surgical History:  Procedure Laterality Date   BACK SURGERY     lumbar fusion    BIV ICD GENERATOR CHANGEOUT N/A 02/06/2023   Procedure: BIV ICD GENERATOR CHANGEOUT;  Surgeon: Marinus Maw, MD;  Location: [facility] INVASIVE CV LAB;  Service: Cardiovascular;  Laterality: N/A;   BREAST LUMPECTOMY Left 2008   BREAST SURGERY  2000   LUMP REMOVAL. STAGE 1 CANCER   CARDIAC CATHETERIZATION     CATARACT EXTRACTION     COLONOSCOPY     EYE SURGERY     IMPLANTABLE CARDIOVERTER DEFIBRILLATOR GENERATOR CHANGE N/A 12/18/2012   Procedure: IMPLANTABLE CARDIOVERTER DEFIBRILLATOR GENERATOR CHANGE;  Surgeon: Marinus Maw, MD;  Location: [facility] CATH LAB;  Service: Cardiovascular;  Laterality: N/A;   IR INJECT/THERA/INC NEEDLE/CATH/PLC EPI/LUMB/SAC W/IMG   06/28/2023   JOINT REPLACEMENT  06/14/01   right   LUMBAR FUSION  2006   MASS EXCISION  11/08/2011   Procedure: EXCISION MASS;  Surgeon: Almond Lint, MD;  Location: [facility] ORS;  Service: General;  Laterality: Left;  Excision Left Thigh Mass   MASTECTOMY W/ SENTINEL NODE BIOPSY Left 06/04/2019   Procedure: LEFT MASTECTOMY WITH SENTINEL LYMPH NODE BIOPSY;  Surgeon: Almond Lint, MD;  Location: [facility] OR;  Service: General;  Laterality: Left;   MASTECTOMY, PARTIAL  2008   GOT PACEMAKER AND DEFIB AT THAT TIME   PACEMAKER INSERTION  04/23/06   TOTAL KNEE ARTHROPLASTY  05/17/01   RIGHT KNEE   TOTAL KNEE ARTHROPLASTY Left 11/29/2014   Procedure: TOTAL LEFT KNEE ARTHROPLASTY;  Surgeon: Durene Romans, MD;  Location: [facility] ORS;  Service: Orthopedics;  Laterality: Left;   Patient Active Problem List   Diagnosis Date Noted   Piriformis syndrome of both sides 01/30/2023   Neuropathy 08/01/2022   Atherosclerosis of native artery of both lower extremities with rest pain (HCC) 07/09/2022   Interstitial pulmonary disease (HCC) 07/09/2022   Sciatica associated with disorder of multiple sites of spine 03/20/2021   Bipolar II disorder (HCC) 03/20/2021   Chronic pain syndrome 03/20/2021   Urinary retention 02/04/2020   Seasonal allergies 02/04/2020   Recurrent major depressive  disorder, in partial remission (HCC) 07/17/2019   Status post left mastectomy 07/01/2019   Breast cancer of lower-outer quadrant of left female breast (HCC) 06/04/2019   Candida infection, oral 01/15/2019   Senile purpura (HCC) 04/14/2018   Lumbar post-laminectomy syndrome 12/03/2017   Lumbar spondylosis 12/03/2017   History of back surgery 09/01/2017   Constipation 09/01/2017   Hypothyroidism due to acquired atrophy of thyroid 09/01/2017   Mixed hyperlipidemia 09/01/2017   Age-related osteoporosis without current pathological fracture 09/01/2017   High risk medication use 09/01/2017   Arthritis of hand 05/17/2017   Atherosclerosis of native  arteries of extremity with intermittent claudication (HCC) 05/15/2017   Status post total bilateral knee replacement 12/20/2016   Gait abnormality 07/16/2016   Chronic low back pain 07/16/2016   Mild cognitive impairment 07/16/2016   Wrist pain 05/10/2015   S/P left TKA 11/29/2014   S/P knee replacement 11/29/2014   Spontaneous bruising 08/27/2014   Numbness and tingling in right hand 07/28/2014   Essential tremor 07/28/2014   Dizziness 05/28/2014   Shaky 05/28/2014   Memory loss 05/28/2014   Depression 05/06/2014   Lipoma of left upper thigh 3x5 cm 09/28/2011   Cerebral vascular accident (HCC) 08/09/2011   Syncope 08/09/2011   History of breast cancer [id number], s/p BCT 2008, triple negative 01/26/2011   Chronic L breast pain with chronic recurrent seroma, s/p excisional biopsy 01/12/2010 01/26/2011   Fainted    ICD (implantable cardioverter-defibrillator), biventricular, in situ 08/02/2010   Chronic systolic heart failure (HCC) 08/02/2010   Essential hypertension 08/02/2010    ONSET DATE: 2022; script dated 06/26/23   REFERRING DIAG:  R13.14 (ICD-10-CM) - Pharyngoesophageal dysphagia    THERAPY DIAG:  Dysphagia, pharyngeal phase  Cognitive communication deficit  Rationale for Evaluation and Treatment: Rehabilitation  SUBJECTIVE:   SUBJECTIVE STATEMENT: "It happens at least once a day." (Pt, re: coughing/choking with liquids) Pt accompanied by: family member  PERTINENT HISTORY: See above. MBS April 2022, results below.  PAIN:  Are you having pain? Yes: NPRS scale: 6/10 Pain location: lower back Pain description: dull ache Aggravating factors: bending Relieving factors: exercise  FALLS: Has patient fallen in last 6 months?  No  LIVING ENVIRONMENT: Lives with: lives with their family Lives in: House/apartment  PLOF:  Level of assistance: Independent with ADLs, Independent with IADLs Employment: Retired  PATIENT GOALS: Find out what's going on with  swallowing  OBJECTIVE:  Note: Objective measures were completed at Evaluation unless otherwise noted.  DIAGNOSTIC FINDINGS:  MBSS - April 2022 CH[facility] IP CLINICAL IMPRESSIONS 07/27/2020  Clinical Impression Pt with functional oropharyngeal swallow and minimal pharyngoesophageal dysphagia without aspiration or penetration of any consistency tested.  Pt was tested with thin, nectar, pudding, cracker and tablet.  Although pt appears with cervical osteophytes C3-C4, C5-C6, C6-C7 did not impair epiglottic deflection nor barium flow.      Swallow was strong and timely without significant retention.  Unfortunately pt did not cough during entire procedure thus making definitive diagnosis difficult.   Pt took the tablet with liquid- extending her head upward to aid oral transiting.  Decreased CP opening allowed tablet temporarily to halt just below CP and was backflowed above UES without pt sensation.  Pt independent consumption of liquids without cues facilitated clearance. She did sense tablet lodging in left side of throat - when it had already cleared into her stomach.   A-P view completed due to h/o pharyngeal diverticulum -Difficult view and SLP unable to view, however if present, it did  not appear to cause any dysphagia or retention of secretions     Recommended pt take pills with pudding - start and follow with liquids.  Thanks for this consult.  SLP Visit Diagnosis Dysphagia, pharyngoesophageal phase (R13.14)  Attention and concentration deficit following --  Frontal lobe and executive function deficit following --  Impact on safety and function Mild aspiration risk     CHL IP DIET RECOMMENDATION 07/27/2020  SLP Diet Recommendations Regular solids;Thin liquid  Liquid Administration via Cup;Straw  Medication Administration Whole meds with heavy puree - start and follow with liquids  Compensations Slow rate;Small sips/bites  Postural Changes Seated upright at 90 degrees;Remain semi-upright after  after feeds/meals (Comment)    INSTRUMENTAL SWALLOW STUDY FINDINGS (MBSS) see above  COGNITION: Overall cognitive status: History of cognitive impairments - at baseline Areas of impairment:  Memory: Impaired: not observed today. Pt answered medical history questions independently. Functional deficits: Dementia dx per CHL  SUBJECTIVE DYSPHAGIA REPORTS:  Date of onset: 2022 Reported symptoms: coughing with liquids  Current diet: regular and thin liquids  Co-morbid voice changes: No  Pt has modified sips to smaller size, to negate frequency of cough with liquids.  FACTORS WHICH MAY INCREASE RISK OF ADVERSE EVENT IN PRESENCE OF ASPIRATION:  General health: well appearing  Risk factors: reduced cognitive function    ORAL MOTOR EXAMINATION: Overall status: Impaired: Labial: Bilateral (Coordination) Lingual: Bilateral (Coordination) Comments: s/sx oral nonverbal apraxia  CLINICAL SWALLOW ASSESSMENT:   Dentition: adequate natural dentition Vocal quality at baseline: normal Patient directly observed with POs: Yes: regular and thin liquids  Feeding: able to feed self Liquids provided by: cup Oral phase signs and symptoms:  none noted Pharyngeal phase signs and symptoms: immediate cough with 2/5 sips liquid. Pt took larger than her normal sips so SLP would witness coughing  PATIENT REPORTED OUTCOME MEASURES (PROM): EAT-10: to be provided in first session                                                                                                                             TREATMENT DATE:   07/16/23: n/a  PATIENT EDUCATION: Education details: basics of MBS, structural vs strength-based dysphagia, pt will return to this clinic if indicated Person educated: Patient and Child(ren) Education method: Explanation Education comprehension: verbalized understanding and needs further education   ASSESSMENT:  CLINICAL IMPRESSION: Patient is a 89 y.o. F who was seen today for  non-instrumental assessment of swallowing in the clinic. She coughed immediately with liquids when taking WNL size sips. With smaller sips and careful swallowing there was no coughing. She reports swallowing in this way to minimize/eliminate coughing with liquids during the day. MBS is warranted and recommended; SLP to contact referring MD and recommend this exam (procedure code: [id number]). SLP suspects osteophytes Id'd from MBS in 2022 have grown and now impinge upon pharyngeal space further compromising safe swallowing, and/or pt has deconditioned in the last 3 years and now cannot compensate  for the presence of osteophytes to complete a safe swallow reflex/response with liquids. Today SLP told pt to complete a goal of 15 effortful swallows BID, in case there is an aspect of pt's dysphagia that is strength-based.   OBJECTIVE IMPAIRMENTS: include memory and dysphagia. These impairments are limiting patient from ADLs/IADLs and safety when swallowing. Factors possibly affecting potential to achieve goals and functional outcome are ability to learn/carryover information and presence of osteophytes . Patient will benefit from skilled SLP services to address above impairments and improve overall function.  REHAB POTENTIAL: Good based upon MBS results, upcoming   GOALS: Goals reviewed with patient? Yes, generally  SHORT TERM GOALS: Target date: 08/22/23  Pt will follow precautions from MBS with POs in therapy session with rare min A in 2 sessions Baseline: Goal status: INITIAL  2.  Pt will complete swallow HEP with occasional min A in 2 sessions Baseline:  Goal status: INITIAL  3.  Pt and/or family will indicate knowledge of overt s/sx of aspiration PNA with modified independence Baseline:  Goal status: INITIAL   LONG TERM GOALS: Target date: 09/20/23  Pt will follow precautions from MBS with POs in therapy session with mod I in 2 sessions Baseline:  Goal status: INITIAL  2.  Pt will  complete swallow HEP with rare min A in 3 sessions Baseline:  Goal status: INITIAL  3.  Pt will complete follow up MBS if clinically indicated, after 08/08/23 Baseline:  Goal status: INITIAL  4.  Pt will improve PROM when compared to initial adminstration Baseline:  Goal status: INITIAL   PLAN:  SLP FREQUENCY: 1-2x/week  SLP DURATION: 8 weeks  PLANNED INTERVENTIONS: Aspiration precaution training, Pharyngeal strengthening exercises, Diet toleration management , Trials of upgraded texture/liquids, Cueing hierachy, Internal/external aids, SLP instruction and feedback, Compensatory strategies, Patient/family education, and [address] Treatment of swallowing function    Tyann Niehaus, CCC-SLP 07/16/2023, 11:45 AM

## 2023-07-17 ENCOUNTER — Ambulatory Visit (HOSPITAL_BASED_OUTPATIENT_CLINIC_OR_DEPARTMENT_OTHER): Admitting: Pulmonary Disease

## 2023-07-17 DIAGNOSIS — J849 Interstitial pulmonary disease, unspecified: Secondary | ICD-10-CM | POA: Diagnosis not present

## 2023-07-17 LAB — PULMONARY FUNCTION TEST
DL/VA % pred: 92 %
DL/VA: 3.89 ml/min/mmHg/L
DLCO cor % pred: 65 %
DLCO cor: 9.66 ml/min/mmHg
DLCO unc % pred: 61 %
DLCO unc: 9.15 ml/min/mmHg
FEF 25-75 Post: 4.4 L/s
FEF 25-75 Pre: 3.88 L/s
FEF2575-%Change-Post: 13 %
FEF2575-%Pred-Post: 617 %
FEF2575-%Pred-Pre: 544 %
FEV1-%Change-Post: 11 %
FEV1-%Pred-Post: 143 %
FEV1-%Pred-Pre: 128 %
FEV1-Post: 1.65 L
FEV1-Pre: 1.48 L
FEV1FVC-%Change-Post: -7 %
FEV1FVC-%Pred-Pre: 137 %
FEV6-%Change-Post: 20 %
FEV6-%Pred-Post: 121 %
FEV6-%Pred-Pre: 101 %
FEV6-Post: 1.78 L
FEV6-Pre: 1.48 L
FEV6FVC-%Pred-Post: 108 %
FEV6FVC-%Pred-Pre: 108 %
FVC-%Change-Post: 20 %
FVC-%Pred-Post: 111 %
FVC-%Pred-Pre: 93 %
FVC-Post: 1.78 L
FVC-Pre: 1.48 L
Post FEV1/FVC ratio: 92 %
Post FEV6/FVC ratio: 100 %
Pre FEV1/FVC ratio: 99 %
Pre FEV6/FVC Ratio: 100 %
RV % pred: 60 %
RV: 1.35 L
TLC % pred: 74 %
TLC: 3.1 L

## 2023-07-17 NOTE — Addendum Note (Signed)
 Addended by: Melody Comas on: 07/17/2023 03:31 PM   Modules accepted: Orders

## 2023-07-17 NOTE — Patient Instructions (Signed)
 Full PFT Performed Today

## 2023-07-17 NOTE — Progress Notes (Signed)
 Full PFT Performed Today

## 2023-07-18 ENCOUNTER — Telehealth (HOSPITAL_COMMUNITY): Payer: Self-pay | Admitting: *Deleted

## 2023-07-18 ENCOUNTER — Other Ambulatory Visit (HOSPITAL_COMMUNITY): Payer: Self-pay | Admitting: Pulmonary Disease

## 2023-07-18 DIAGNOSIS — R059 Cough, unspecified: Secondary | ICD-10-CM

## 2023-07-18 DIAGNOSIS — R1310 Dysphagia, unspecified: Secondary | ICD-10-CM

## 2023-07-18 NOTE — Telephone Encounter (Signed)
 Attempted to contact patient to schedule OP MBS. Left VM @ [phone]. RKEEL

## 2023-07-29 NOTE — Progress Notes (Unsigned)
 [name] D.[name] [address] [place] [address] Phone: [phone]   Assessment and Plan:     There are no diagnoses linked to this encounter.  ***   Pertinent previous records reviewed include ***    Follow Up: ***     Subjective:   I, [name], am serving as a Neurosurgeon for Doctor Ulysees Gander   Chief Complaint: piriformis pain    HPI:    05/07/2023 Patient is a 89 year old female referred to our office with piriformis/gluteal pain. Patient states has had pain for a few years. States pain goes down the legs to the ankle .  Right sided numbness and tingling.. Tylenol  for the pain and it doesn't help. Laying with her feet elevated no pain. Pain when walking she does use a cane. When she extends her legs bilat she feels the pain along her low back/piriformis area.    06/19/2023 Patient states still has pain    07/30/2023 Patient states   Relevant Historical Information: CHF, hypertension, history of ICD, history of breast cancer, bipolar 2  Additional pertinent review of systems negative.   Current Outpatient Medications:    albuterol  (VENTOLIN  HFA) 108 (90 Base) MCG/ACT inhaler, Inhale 2 puffs into the lungs every 6 (six) hours as needed for wheezing or shortness of breath., Disp: 8 g, Rfl: 11   ascorbic acid (VITAMIN C) 500 MG tablet, Take 500 mg by mouth daily., Disp: , Rfl:    buPROPion  (WELLBUTRIN  XL) 150 MG 24 hr tablet, Take 1 tablet (150 mg total) by mouth daily., Disp: , Rfl:    carvedilol  (COREG ) 3.125 MG tablet, TAKE 1 TABLET BY MOUTH TWICE  DAILY, Disp: 180 tablet, Rfl: 3   Cholecalciferol (VITAMIN D3) 50 MCG (2000 UT) TABS, Take 2,000 Units by mouth daily. , Disp: , Rfl:    Clobetasol  Prop Emollient Base (CLOBETASOL  PROPIONATE E) 0.05 % emollient cream, Apply 1 Application topically at bedtime., Disp: 30 g, Rfl: 11   diclofenac  Sodium (PENNSAID ) 2 % SOLN, APPLY 2 PUMPS (40MG ) UP TO TWO PAINFUL AREAS  TWICE A DAY, Disp: 224 g, Rfl: 0   diphenhydrAMINE  (BENADRYL ) 50 MG tablet, 50 mg oral diphenhydramine  administered one hour prior to contrast administration., Disp: 1 tablet, Rfl: 0   DULoxetine (CYMBALTA) 60 MG capsule, Take 60 mg by mouth daily., Disp: , Rfl:    estradiol  (ESTRACE ) 0.1 MG/GM vaginal cream, Place 0.5 g vaginally 2 (two) times a week. Place 0.5g nightly for two weeks then twice a week after, Disp: 30 g, Rfl: 11   famotidine (PEPCID) 10 MG tablet, Take 10 mg by mouth daily as needed for heartburn or indigestion., Disp: , Rfl:    furosemide  (LASIX ) 20 MG tablet, Take 1 tablet (20 mg total) by mouth daily as needed., Disp: 90 tablet, Rfl: 3   levothyroxine  (SYNTHROID ) 88 MCG tablet, Take 1 tablet (88 mcg total) by mouth daily., Disp: 90 tablet, Rfl: 3   lidocaine  (XYLOCAINE ) 5 % ointment, Apply 1 Application topically as needed (up to 4x/day as needed)., Disp: 50 g, Rfl: 5   Lidocaine  4 % PTCH, Apply 1 patch topically every 12 (twelve) hours. Apply to back, Disp: , Rfl:    lidocaine -prilocaine  (EMLA ) cream, Apply 1 Application topically as needed., Disp: 30 g, Rfl: 0   lubiprostone  (AMITIZA ) 8 MCG capsule, TAKE ONE CAPSULE BY MOUTH TWICE DAILY WITH A MEAL, Disp: 180 capsule, Rfl: 3   Magnesium  250 MG TABS, Take  250 mg by mouth daily., Disp: , Rfl:    metaxalone  (SKELAXIN ) 800 MG tablet, Take 0.5 tablets (400 mg total) by mouth 3 (three) times daily as needed for muscle spasms., Disp: 60 tablet, Rfl: 5   Misc Natural Products (PUMPKIN SEED  OIL) CAPS, Take 1 capsule by mouth daily., Disp: 180 capsule, Rfl: 0   NALTREXONE  HCL, PAIN, PO, Take 4 mg by mouth at bedtime., Disp: , Rfl:    NONFORMULARY OR COMPOUNDED ITEM, Apply 120 Tubes topically daily. Diclofenac /Cyclobenzaprine/lamotrigine/lidocaine /prilocaine  (10480) 2%/2%/6%/5%/1.25% cream QTY: 120 GM SIG: (NEURO) APPLY 1-2 PUMPS (1-2 GMS) TO AFFECTED AREA (S) OR FOCAL POINTS 3 TO 4 TIMES DAILY. RUB IN FOR 2 MINUTES TO ACHIEVE MAX  PENETRATION. WASH HANDS WELL., Disp: 1 each, Rfl: 2   Omega-3 Fatty Acids (FISH OIL) 1200 MG CAPS, Take 1,200 mg by mouth daily., Disp: , Rfl:    omeprazole  (PRILOSEC) 40 MG capsule, TAKE 1 CAPSULE BY MOUTH DAILY, Disp: 90 capsule, Rfl: 3   Polyethyl Glycol-Propyl Glycol (SYSTANE) 0.4-0.3 % SOLN, Place 1 drop into both eyes at bedtime., Disp: , Rfl:    POLYETHYLENE GLYCOL 3350  PO, Take 1 Capful by mouth daily., Disp: , Rfl:    predniSONE  (DELTASONE ) 50 MG tablet, three doses of 50 mg oral prednisone  administered 13, 7, and 1 hour prior to contrast administration, Disp: 3 tablet, Rfl: 0   pregabalin  (LYRICA ) 25 MG capsule, Take 1 capsule (25 mg total) by mouth 2 (two) times daily., Disp: 60 capsule, Rfl: 5   senna-docusate (SENOKOT S) 8.6-50 MG tablet, Take 2 tablets by mouth daily., Disp: 60 tablet, Rfl: 5   spironolactone  (ALDACTONE ) 25 MG tablet, TAKE 1 TABLET BY MOUTH IN THE  MORNING, Disp: 90 tablet, Rfl: 1   telmisartan  (MICARDIS ) 20 MG tablet, Take 1 tablet (20 mg total) by mouth daily., Disp: 90 tablet, Rfl: 3   Trospium  Chloride 60 MG CP24, Take 1 capsule (60 mg total) by mouth daily., Disp: 30 capsule, Rfl: 3   Turmeric (QC TUMERIC COMPLEX PO), Take 1,000 mg by mouth daily., Disp: , Rfl:    Vitamin E 400 units TABS, Take 400 Units by mouth daily. , Disp: , Rfl:   Current Facility-Administered Medications:    [START ON 11/28/2023] denosumab  (PROLIA ) injection 60 mg, 60 mg, Subcutaneous, Q6 months, Eubanks, Champ Coma, NP   Objective:     There were no vitals filed for this visit.    There is no height or weight on file to calculate BMI.    Physical Exam:    ***   Electronically signed by:  Marshall Skeeter D.[name] 2:13 PM 07/29/23

## 2023-07-30 ENCOUNTER — Ambulatory Visit (INDEPENDENT_AMBULATORY_CARE_PROVIDER_SITE_OTHER): Admitting: Sports Medicine

## 2023-07-30 VITALS — HR 83 | Ht <= 58 in | Wt 117.0 lb

## 2023-07-30 DIAGNOSIS — M51362 Other intervertebral disc degeneration, lumbar region with discogenic back pain and lower extremity pain: Secondary | ICD-10-CM | POA: Diagnosis not present

## 2023-07-30 DIAGNOSIS — R29898 Other symptoms and signs involving the musculoskeletal system: Secondary | ICD-10-CM | POA: Diagnosis not present

## 2023-07-30 DIAGNOSIS — Z981 Arthrodesis status: Secondary | ICD-10-CM

## 2023-07-30 DIAGNOSIS — Z888 Allergy status to other drugs, medicaments and biological substances status: Secondary | ICD-10-CM

## 2023-07-30 DIAGNOSIS — G8929 Other chronic pain: Secondary | ICD-10-CM

## 2023-07-30 NOTE — Patient Instructions (Signed)
 PT referral  Neurosurgery referral  As needed follow up

## 2023-08-01 ENCOUNTER — Ambulatory Visit (HOSPITAL_COMMUNITY)
Admission: RE | Admit: 2023-08-01 | Discharge: 2023-08-01 | Disposition: A | Discharge status: Home / Self Care | Source: Ambulatory Visit | Attending: Nurse Practitioner | Admitting: Nurse Practitioner

## 2023-08-01 ENCOUNTER — Encounter: Payer: Self-pay | Admitting: Physician Assistant

## 2023-08-01 ENCOUNTER — Ambulatory Visit: Payer: Medicare Other

## 2023-08-01 ENCOUNTER — Ambulatory Visit (INDEPENDENT_AMBULATORY_CARE_PROVIDER_SITE_OTHER): Payer: Medicare Other | Admitting: Physician Assistant

## 2023-08-01 VITALS — BP 131/79 | HR 80 | Resp 20 | Ht <= 58 in | Wt 119.0 lb

## 2023-08-01 DIAGNOSIS — M2578 Osteophyte, vertebrae: Secondary | ICD-10-CM | POA: Diagnosis not present

## 2023-08-01 DIAGNOSIS — F02A0 Dementia in other diseases classified elsewhere, mild, without behavioral disturbance, psychotic disturbance, mood disturbance, and anxiety: Secondary | ICD-10-CM | POA: Diagnosis not present

## 2023-08-01 DIAGNOSIS — R059 Cough, unspecified: Secondary | ICD-10-CM | POA: Diagnosis present

## 2023-08-01 DIAGNOSIS — G301 Alzheimer's disease with late onset: Secondary | ICD-10-CM

## 2023-08-01 DIAGNOSIS — R1310 Dysphagia, unspecified: Secondary | ICD-10-CM | POA: Insufficient documentation

## 2023-08-01 DIAGNOSIS — R1314 Dysphagia, pharyngoesophageal phase: Secondary | ICD-10-CM

## 2023-08-01 NOTE — Evaluation (Signed)
 Modified Barium Swallow Study  Patient Details  Name: [name] MRN: [id number] Date of Birth: 25-Jun-1933  Today's Date: 08/01/2023  Modified Barium Swallow completed.  Full report located under Chart Review in the Imaging Section.  History of Present Illness [name] is an 89 y.o. female who was referred for an OP MBS due to increased episodes of coughing when drinking thin liquids. July 16, 2023 assessment by OP SLP indicated need for instrumental swallow study. PMHx HTN, ILD, dementia, GERD, ICD . Prior MBS 07/27/20 functional oropharyngeal swallow without penetration/aspiration.  Disc osteophyte complexes noted at C4-5 and C5-6 per CT cervical spine 11/04/09- no subsequent imaging of cervical vertebrae could be found in EMR.   Clinical Impression Pt presents with a functional oropharyngeal swallow.  Cervical osteophytes do not appear to be interfering with epiglottic closure or pharyngeal clearance.  There was one episode of transient penetration of thin liquids on the initial swallow; this was not repeated on subsequent swallows, even when taxed with sequential sips and mixed solid/liquid consistencies. There was no aspiration.  Oral phase was normal, and there was adequate pharyngeal squeeze and base of tongue retraction in order to transfer bolus through PES.  Presence of osteophytes did create a narrowing of the proximal esophagus, but solid food and a 13 mm barium pill traversed the esophagus.  Pt was asymptomatic during the study, so we may have not captured swallow deviations that could contribute to her frequent coughing.  Her daughter reports decreased symptoms of cough since she has been working with SLP at OP - improvements may be reflected on today's study. Recommend returning to [name], OP SLP, for f/u, review of study and to determine needs subsequent to MBS.  DIGEST Swallow Severity Rating*  Safety: 0  Efficiency:0  Overall Pharyngeal Swallow Severity: 0 -  WFL 1: mild; 2: moderate; 3: severe; 4: profound  *The Dynamic Imaging Grade of Swallowing Toxicity is standardized for the head and neck cancer population, however, demonstrates promising clinical applications across populations to standardize the clinical rating of pharyngeal swallow safety and severity.  Factors that may increase risk of adverse event in presence of aspiration Roderick Civatte & Jessy Morocco 2021):    Swallow Evaluation Recommendations Recommendations: PO diet PO Diet Recommendation: Regular;Thin liquids (Level 0) Liquid Administration via: Cup;Straw Medication Administration: Whole meds with liquid Supervision: Patient able to self-feed Oral care recommendations: Oral care BID (2x/day)    Indalecio Malmstrom L. Beatris Lincoln, MA CCC/SLP Clinical Specialist - Acute Care SLP Acute Rehabilitation Services Office number [phone]   [name] 08/01/2023,12:13 PM

## 2023-08-01 NOTE — Progress Notes (Signed)
 Assessment/Plan:   Dementia likely due to Alzheimer's disease, mild, late onset, without behavioral disturbance   [name] is a very pleasant 89 y.o. RH female with a history of hypertension, hyperlipidemia, hypothyroidism, LBBB status post ICD placement, breast cancer status postmastectomy, chronic back pain, bipolar disorder, depression, anxiety, and mild dementia likely due to Alzheimer's disease seen today in follow up for memory loss. Patient is no longer on antidementia medication due to undesirable side effects and is not interested in trying galantamine.  Memory is stable, with MMSE of 30/30 despite subjective complaints.  She is independent with her ADLs and attends [facility] 3 times a week.  She no longer drives    Follow up in 6  months. Recommend good control of her cardiovascular risk factors Continue to control mood as per PCP Continue attending [facility] at [facility]     Subjective:    This patient is accompanied in the office by her daughter who supplements the history.  Previous records as well as any outside records available were reviewed prior to todays visit. Patient was last seen on 01/29/2023 with MMSE 28/30   Any changes in memory since last visit? "It may be worse, with some good and bad days "-she reports.  Sometimes she has difficulty with comprehension, names and recent conversations." LTM is getting bad too"-she says.  She attends [facility] 3 times a week and enjoys it very much. repeats oneself?  Denies.  Disoriented when walking into a room?  Patient denies    Leaving objects?  May misplace things but not in unusual places   Wandering behavior?  Denies.   Any personality changes since last visit?  Denies.   Any worsening depression?: She has more "down moments". Hs not seen her psych in a while.  Hallucinations or paranoia?  Denies.   Seizures? Denies.    Any sleep changes?  "Not good, because she has to get up to urinate" Denies vivid dreams, REM behavior  or sleepwalking   Sleep apnea?   Denies.   Any hygiene concerns? Denies.  Independent of bathing and dressing?  Endorsed  Does the patient needs help with medications?  Patient is in charge   Who is in charge of the finances?  Daughter is in charge     Any changes in appetite?  Denies.     Patient have trouble swallowing? Denies.   Does the patient cook? No Any headaches?   Rarely   Chronic back pain May have some sciatica, has recently been referred to PT in 1-2 weeks. To be seen bu [facility] thereafter  Ambulates with difficulty? Denies.  She enjoys chair Zumba and uses a cane which she has named [name]  Recent falls or head injuries? Denies.     Unilateral weakness, numbness or tingling? denies   Any tremors?  Denies   Any anosmia?  Denies   Any incontinence of urine?  She has a history of OAB and has a pessary.  She has to wake up at night to urinate at least twice.  She is followed by urology.  She uses [facility].   Any bowel dysfunction?   Denies      Patient lives with her daughter  Does the patient drive? No longer drives      History on Initial Assessment 10/13/2020: This is an 89 year old right-handed woman with a history of hypertension, hyperlipidemia, hypothyroidism, LBBB s/p ICD placement, breast cancer s/p mastectomy, chronic back pain, bipolar depression, anxiety, presenting for evaluation  of memory loss. She had been following with [name] for memory loss since Aug 22, 2014, records were reviewed. She started noticing mild short-term memory changes in 22-Aug-2002 after lumbar surgery. Over the years, memory changes have progressed, her daughter moved in with her in 08-22-2019 and she stopped driving. She had side effects on Donepezil , Rivastigmine , and Memantine .    She states her memory is "terrible." [name] reports memory changes became more noticeable after her husband passed away in 08/22/18. She had left the stove on a couple of times and left keys outside the door. She would forget what she  was going to say and forget conversations from the day prior. She would repeat herself at times. She manages her own medications, [name] checks behind her. Her husband was managing finances, [name] took over when he passed away. She stopped driving in Jan/Feb 2021 because she was having staggering gait. She is independent with dressing and bathing. Notes from [facility] indicate she was tried on Donepezil  in 2015-08-22, Memantine  in 2016/08/21, Rivastigmine  in 2017/08/21. She felt "like a mummy, could not think."    She denies any headaches, diplopia, dysarthria/dysphagia, neck pain, focal numbness/tingling/weakness, anosmia. She has chronic back pain and constipation. She has occasional dizziness. There are tremors in both hands affecting handwriting. Sleep is okay, she usually sleeps 8 hours at night. Her last fall was in January 2022 when she fell off the bed. She had worked with physical therapy and staggering gait has stabilized. Mood is "sometimes stable," she is more irritable with herself when she could not remember things. [name] notices she is more anxious when she has to leave. No hallucinations or paranoia. No family history of dementia, history of significant head injuries, or alcohol use.    PREVIOUS MEDICATIONS: Donepezil  in 08/22/15, memantine  2016/08/21, rivastigmine  08-21-17 (all of them reported to "feel like a mummy, I could not think)  CURRENT MEDICATIONS:  Outpatient Encounter Medications as of 08/01/2023  Medication Sig   albuterol  (VENTOLIN  HFA) 108 (90 Base) MCG/ACT inhaler Inhale 2 puffs into the lungs every 6 (six) hours as needed for wheezing or shortness of breath.   ascorbic acid (VITAMIN C) 500 MG tablet Take 500 mg by mouth daily.   buPROPion  (WELLBUTRIN  XL) 150 MG 24 hr tablet Take 1 tablet (150 mg total) by mouth daily.   carvedilol  (COREG ) 3.125 MG tablet TAKE 1 TABLET BY MOUTH TWICE  DAILY   Cholecalciferol (VITAMIN D3) 50 MCG (2000 UT) TABS Take 2,000 Units by mouth daily.    Clobetasol  Prop Emollient  Base (CLOBETASOL  PROPIONATE E) 0.05 % emollient cream Apply 1 Application topically at bedtime.   diclofenac  Sodium (PENNSAID ) 2 % SOLN APPLY 2 PUMPS (40MG ) UP TO TWO PAINFUL AREAS TWICE A DAY   diphenhydrAMINE  (BENADRYL ) 50 MG tablet 50 mg oral diphenhydramine  administered one hour prior to contrast administration.   DULoxetine (CYMBALTA) 60 MG capsule Take 60 mg by mouth daily.   estradiol  (ESTRACE ) 0.1 MG/GM vaginal cream Place 0.5 g vaginally 2 (two) times a week. Place 0.5g nightly for two weeks then twice a week after   famotidine (PEPCID) 10 MG tablet Take 10 mg by mouth daily as needed for heartburn or indigestion.   furosemide  (LASIX ) 20 MG tablet Take 1 tablet (20 mg total) by mouth daily as needed.   levothyroxine  (SYNTHROID ) 88 MCG tablet Take 1 tablet (88 mcg total) by mouth daily.   lidocaine  (XYLOCAINE ) 5 % ointment Apply 1 Application topically as needed (up to 4x/day as needed).  Lidocaine  4 % PTCH Apply 1 patch topically every 12 (twelve) hours. Apply to back   lidocaine -prilocaine  (EMLA ) cream Apply 1 Application topically as needed.   lubiprostone  (AMITIZA ) 8 MCG capsule TAKE ONE CAPSULE BY MOUTH TWICE DAILY WITH A MEAL   Magnesium  250 MG TABS Take 250 mg by mouth daily.   metaxalone  (SKELAXIN ) 800 MG tablet Take 0.5 tablets (400 mg total) by mouth 3 (three) times daily as needed for muscle spasms.   Misc Natural Products (PUMPKIN SEED  OIL) CAPS Take 1 capsule by mouth daily.   NALTREXONE  HCL, PAIN, PO Take 4 mg by mouth at bedtime.   NONFORMULARY OR COMPOUNDED ITEM Apply 120 Tubes topically daily. Diclofenac /Cyclobenzaprine/lamotrigine/lidocaine /prilocaine  (10480) 2%/2%/6%/5%/1.25% cream QTY: 120 GM SIG: (NEURO) APPLY 1-2 PUMPS (1-2 GMS) TO AFFECTED AREA (S) OR FOCAL POINTS 3 TO 4 TIMES DAILY. RUB IN FOR 2 MINUTES TO ACHIEVE MAX PENETRATION. WASH HANDS WELL.   Omega-3 Fatty Acids (FISH OIL) 1200 MG CAPS Take 1,200 mg by mouth daily.   omeprazole  (PRILOSEC) 40 MG capsule TAKE  1 CAPSULE BY MOUTH DAILY   Polyethyl Glycol-Propyl Glycol (SYSTANE) 0.4-0.3 % SOLN Place 1 drop into both eyes at bedtime.   POLYETHYLENE GLYCOL 3350  PO Take 1 Capful by mouth daily.   pregabalin  (LYRICA ) 25 MG capsule Take 1 capsule (25 mg total) by mouth 2 (two) times daily.   senna-docusate (SENOKOT S) 8.6-50 MG tablet Take 2 tablets by mouth daily.   spironolactone  (ALDACTONE ) 25 MG tablet TAKE 1 TABLET BY MOUTH IN THE  MORNING   telmisartan  (MICARDIS ) 20 MG tablet Take 1 tablet (20 mg total) by mouth daily.   Trospium  Chloride 60 MG CP24 Take 1 capsule (60 mg total) by mouth daily.   Turmeric (QC TUMERIC COMPLEX PO) Take 1,000 mg by mouth daily.   Vitamin E 400 units TABS Take 400 Units by mouth daily.    [DISCONTINUED] predniSONE  (DELTASONE ) 50 MG tablet three doses of 50 mg oral prednisone  administered 13, 7, and 1 hour prior to contrast administration   Facility-Administered Encounter Medications as of 08/01/2023  Medication   [START ON 11/28/2023] denosumab  (PROLIA ) injection 60 mg       08/01/2023    5:00 PM 05/27/2023   10:53 AM 01/29/2023   12:00 PM  MMSE - Mini Mental State Exam  Orientation to time 5 5 4   Orientation to Place 5 5 5   Registration 3 3 3   Attention/ Calculation 5 3 5   Recall 3 3 3   Language- name 2 objects 2 2 2   Language- repeat 1 1 1   Language- follow 3 step command 3 3 3   Language- read & follow direction 1 1 1   Write a sentence 1 1 1   Copy design 1 1 0  Total score 30 28 28       10/13/2020   10:00 AM  Montreal Cognitive Assessment   Visuospatial/ Executive (0/5) 2  Naming (0/3) 3  Attention: Read list of digits (0/2) 2  Attention: Read list of letters (0/1) 1  Attention: Serial 7 subtraction starting at 100 (0/3) 2  Language: Repeat phrase (0/2) 1  Language : Fluency (0/1) 1  Abstraction (0/2) 1  Delayed Recall (0/5) 2  Orientation (0/6) 6  Total 21  Adjusted Score (based on education) 21    Objective:     PHYSICAL EXAMINATION:     VITALS:   Vitals:   08/01/23 1446  BP: 131/79  Pulse: 80  Resp: 20  SpO2: 95%  Weight: 119  lb (54 kg)  Height: 4\' 10"  (1.473 m)    GEN:  The patient appears stated age and is in NAD. HEENT:  Normocephalic, atraumatic.   Neurological examination:  General: NAD, well-groomed, appears stated age. Orientation: The patient is alert. Oriented to person, place and  date Cranial nerves: There is good facial symmetry.The speech is fluent and clear. No aphasia or dysarthria. Fund of knowledge is appropriate. Recent and remote memory are impaired. Attention and concentration are normal.  Able to name objects and repeat phrases.  Hearing is intact to conversational tone.  Sensation: Sensation is intact to light touch throughout Motor: Strength is at least antigravity x4. DTR's 2/4 in UE/LE     Movement examination: Tone: There is normal tone in the UE/LE Abnormal movements:  no tremor.  No myoclonus.  No asterixis.   Coordination:  There is no decremation with RAM's. Normal finger to nose  Gait and Station: The patient has no difficulty arising out of a deep-seated chair without the use of the hands. The patient's stride length is good.  Gait is cautious and narrow.    Thank you for allowing us  the opportunity to participate in the care of this nice patient. Please do not hesitate to contact us  for any questions or concerns.   Total time spent on today's visit was 30 minutes dedicated to this patient today, preparing to see patient, examining the patient, ordering tests and/or medications and counseling the patient, documenting clinical information in the EHR or other health record, independently interpreting results and communicating results to the patient/family, discussing treatment and goals, answering patient's questions and coordinating care.  Cc:  Verma Gobble, NP  Tex Filbert 08/01/2023 5:40 PM

## 2023-08-01 NOTE — Patient Instructions (Signed)
  You are doing great! Continue atendance to the Adult Day Program  Follow in 6 months

## 2023-08-05 NOTE — Therapy (Signed)
 OUTPATIENT PHYSICAL THERAPY THORACOLUMBAR EVALUATION   Patient Name: [name] MRN: [id number] DOB:1933/11/29, 89 y.o., female Today's Date: 08/06/2023  END OF SESSION:  PT End of Session - 08/06/23 1535     Visit Number 1    Date for PT Re-Evaluation 10/01/23    Authorization Type [employer] Medicare (auth requested)    Progress Note Due on Visit 10    PT Start Time 0847    PT Stop Time 0950    PT Time Calculation (min) 63 min    Activity Tolerance Patient tolerated treatment well    Behavior During Therapy [facility] for tasks assessed/performed             Past Medical History:  Diagnosis Date   Arthritis    Cancer (HCC)    left breast cancer    Cataracts, bilateral    removed by surgery   CHF (congestive heart failure) (HCC)    PACEMAKER & DEFIB   Complication of anesthesia    hypotensive after back surgery in 2006   Depression    Dyslipidemia    Fainted 04/21/06   AT [facility]   GERD (gastroesophageal reflux disease)    Headache(784.0)    Hearing loss    bilateral hearing aids   HLD (hyperlipidemia)    diet controlled    Hypertension    Hypothyroidism    ICD (implantable cardiac defibrillator) in place    pt has pacer/icd   ICD (implantable cardiac defibrillator), biventricular, in situ    LBBB (left bundle branch block)    Memory loss    Nonischemic cardiomyopathy (HCC)    Normal coronary arteries    s/p cardiac cath 2007   Pacemaker    ICD [employer]   Syncope    Systolic CHF [facility])    Vertigo    Wears glasses    Past Surgical History:  Procedure Laterality Date   BACK SURGERY     lumbar fusion    BIV ICD GENERATOR CHANGEOUT N/A 02/06/2023   Procedure: BIV ICD GENERATOR CHANGEOUT;  Surgeon: Tammie Fall, MD;  Location: [facility] INVASIVE CV LAB;  Service: Cardiovascular;  Laterality: N/A;   BREAST LUMPECTOMY Left 2008   BREAST SURGERY  2000   LUMP REMOVAL. STAGE 1 CANCER   CARDIAC CATHETERIZATION     CATARACT EXTRACTION     COLONOSCOPY      EYE SURGERY     IMPLANTABLE CARDIOVERTER DEFIBRILLATOR GENERATOR CHANGE N/A 12/18/2012   Procedure: IMPLANTABLE CARDIOVERTER DEFIBRILLATOR GENERATOR CHANGE;  Surgeon: Tammie Fall, MD;  Location: [facility] CATH LAB;  Service: Cardiovascular;  Laterality: N/A;   IR INJECT/THERA/INC NEEDLE/CATH/PLC EPI/LUMB/SAC W/IMG  06/28/2023   JOINT REPLACEMENT  06/14/01   right   LUMBAR FUSION  2006   MASS EXCISION  11/08/2011   Procedure: EXCISION MASS;  Surgeon: Lockie Rima, MD;  Location: [facility] ORS;  Service: General;  Laterality: Left;  Excision Left Thigh Mass   MASTECTOMY W/ SENTINEL NODE BIOPSY Left 06/04/2019   Procedure: LEFT MASTECTOMY WITH SENTINEL LYMPH NODE BIOPSY;  Surgeon: Lockie Rima, MD;  Location: [facility] OR;  Service: General;  Laterality: Left;   MASTECTOMY, PARTIAL  2008   GOT PACEMAKER AND DEFIB AT THAT TIME   PACEMAKER INSERTION  04/23/06   TOTAL KNEE ARTHROPLASTY  05/17/01   RIGHT KNEE   TOTAL KNEE ARTHROPLASTY Left 11/29/2014   Procedure: TOTAL LEFT KNEE ARTHROPLASTY;  Surgeon: Claiborne Crew, MD;  Location: [facility] ORS;  Service: Orthopedics;  Laterality: Left;   Patient Active  Problem List   Diagnosis Date Noted   Piriformis syndrome of both sides 01/30/2023   Neuropathy 08/01/2022   Atherosclerosis of native artery of both lower extremities with rest pain (HCC) 07/09/2022   Interstitial pulmonary disease (HCC) 07/09/2022   Sciatica associated with disorder of multiple sites of spine 03/20/2021   Bipolar II disorder (HCC) 03/20/2021   Chronic pain syndrome 03/20/2021   Urinary retention 02/04/2020   Seasonal allergies 02/04/2020   Recurrent major depressive disorder, in partial remission (HCC) 07/17/2019   Status post left mastectomy 07/01/2019   Breast cancer of lower-outer quadrant of left female breast (HCC) 06/04/2019   Candida infection, oral 01/15/2019   Senile purpura (HCC) 04/14/2018   Lumbar post-laminectomy syndrome 12/03/2017   Lumbar spondylosis 12/03/2017   History of back surgery  09/01/2017   Constipation 09/01/2017   Hypothyroidism due to acquired atrophy of thyroid 09/01/2017   Mixed hyperlipidemia 09/01/2017   Age-related osteoporosis without current pathological fracture 09/01/2017   High risk medication use 09/01/2017   Arthritis of hand 05/17/2017   Atherosclerosis of native arteries of extremity with intermittent claudication (HCC) 05/15/2017   Status post total bilateral knee replacement 12/20/2016   Gait abnormality 07/16/2016   Chronic low back pain 07/16/2016   Mild cognitive impairment 07/16/2016   Wrist pain 05/10/2015   S/P left TKA 11/29/2014   S/P knee replacement 11/29/2014   Spontaneous bruising 08/27/2014   Numbness and tingling in right hand 07/28/2014   Essential tremor 07/28/2014   Dizziness 05/28/2014   Shaky 05/28/2014   Memory loss 05/28/2014   Depression 05/06/2014   Lipoma of left upper thigh 3x5 cm 09/28/2011   Cerebral vascular accident (HCC) 08/09/2011   Syncope 08/09/2011   History of breast cancer [id number], s/p BCT 2008, triple negative 01/26/2011   Chronic L breast pain with chronic recurrent seroma, s/p excisional biopsy 01/12/2010 01/26/2011   Fainted    ICD (implantable cardioverter-defibrillator), biventricular, in situ 08/02/2010   Chronic systolic heart failure (HCC) 08/02/2010   Essential hypertension 08/02/2010    PCP: [facility], Linnell Richardson, NP  REFERRING PROVIDER: Ulysees Gander, DO  REFERRING DIAG:  Diagnosis  M54.42,M54.41,G89.29 (ICD-10-CM) - Chronic bilateral low back pain with bilateral sciatica  R29.898 (ICD-10-CM) - Right leg weakness  R29.898 (ICD-10-CM) - Left leg weakness  M51.362 (ICD-10-CM) - Degeneration of intervertebral disc of lumbar region with discogenic back pain and lower extremity pain  Z98.1 (ICD-10-CM) - History of lumbar fusion  Z88.8 (ICD-10-CM) - Allergy to iodine   G89.29,M54.41 (ICD-10-CM) - Chronic bilateral low back pain with right-sided sciatic    Rationale for Evaluation  and Treatment: Rehabilitation  THERAPY DIAG:  Other low back pain - Plan: PT plan of care cert/re-cert  Muscle weakness (generalized) - Plan: PT plan of care cert/re-cert  Abnormal posture - Plan: PT plan of care cert/re-cert  Difficulty in walking, not elsewhere classified - Plan: PT plan of care cert/re-cert  Cramp and spasm - Plan: PT plan of care cert/re-cert  ONSET DATE: Chronic but recent flare up November 2024  SUBJECTIVE:  SUBJECTIVE STATEMENT: Patient presents with back pain that travels down both legs but it hurts more her right leg. Leg pain is more recent it began November 2024. When pain travels down legs she has increased muscle spasms and it feels like a squeezing sensation. Her sitting tolerance is limited to 30 minutes to an hour and she is not able to bend to pick up objects. Patient has occasional numbness and tingling in her legs when she is walking. When she sits down the pain takes a while to go away.  Patient's daughter Bartholomew Light is present  PERTINENT HISTORY:  Hx lumbar fusion; Hx Left breast cancer; Left breast lumpectomy; depression, pacemaker; HTN; Vertigo; Hx bilateral TKA; neuropathy   PAIN:  Are you having pain? Yes: NPRS scale: 6(currently) 7-8(worst)/10 Pain location: both sides of lumbar side + bilateral legs Pain description: achy Aggravating factors: Bending; Sitting > 30 min - an hour Relieving factors: Nothing  PRECAUTIONS: None  RED FLAGS: None   WEIGHT BEARING RESTRICTIONS: No  FALLS:  Has patient fallen in last 6 months? No Sometimes patient feels unsteady when she is walking and she would like to address this with physical therapy  LIVING ENVIRONMENT: Lives with: lives with their daughter Lives in: House/apartment Stairs: No Has following equipment at  home: Counselling psychologist, shower chair, Grab bars, and Bedrails  OCCUPATION: Retired  PLOF: Independent with basic ADLs and Independent with household mobility with device  PATIENT GOALS: To get rid of back pain  NEXT MD VISIT: PRN  OBJECTIVE:  Note: Objective measures were completed at Evaluation unless otherwise noted.   PATIENT SURVEYS:  Modified Oswestry 26/50 52%   COGNITION: Overall cognitive status: Within functional limits for tasks assessed     SENSATION: Occasional numbness and tingling in legs  MUSCLE LENGTH: Hamstrings: decreased bilateral    POSTURE: rounded shoulders, forward head, and increased thoracic kyphosis    LUMBAR ROM: all motions limited and painful.    LOWER EXTREMITY ROM:   PROM WFL; pain with Rt hip abduction PROM    LOWER EXTREMITY MMT:  * pain in back  MMT Right eval Left eval  Hip flexion 4-* 4-  Hip extension    Hip abduction 4- 4-  Hip adduction 4- 4-  Hip internal rotation    Hip external rotation    Knee flexion 4-* 4-  Knee extension 4- 4-  Ankle dorsiflexion    Ankle plantarflexion    Ankle inversion    Ankle eversion     (Blank rows = not tested)    FUNCTIONAL TESTS:  5 times sit to stand: 21.54 sec attempted no UE first stand but patient fell back. Used UE support for remaining stands Timed up and go (TUG): 18.18 sec with SBQC  GAIT: Assistive device utilized: Quad cane small base Level of assistance: SBA Comments: decreased cadence; decreased step length bilateral ; flexed trunk  TREATMENT DATE:  08/06/2023 Initial Evaluation & HEP created  Educated patient on logrolling technique. When patient complete supine to sit transfer she verbalized some dizziness. After sitting for two minutes she felt good enough to stand. Upon standing she felt dizzy and unsteady. Provided patient water  and  took two BP readings: 9:34 am 152/86  HR 64   9:42am   158/88 HR 65 This is not patient's normal BP readings. She ate a good breakfast this morning. Educated patient on how anxiety and dehydration can impact how she feels and BP readings. Patient felts better after sitting for 5 minutes. Patient left clinic sitting in wheelchair at PT's required due to changes in her gait pattern.    PATIENT EDUCATION:  Education details: POC; hydration; logroll technique Person educated: Patient and Child(ren) Education method: Explanation, Demonstration, and Handouts Education comprehension: verbalized understanding, returned demonstration, and needs further education  HOME EXERCISE PROGRAM: To be started  ASSESSMENT:  CLINICAL IMPRESSION: Patient is a 89 y.o. female who was seen today for physical therapy evaluation and treatment for back pain. [name] presents to physical therapy with chronic back pain that radiates down her legs. She has chronic back pain but the leg pain started November 2024. Her pain is limiting her sitting tolerance and standing tolerance. Based on evaluation noted muscle weakness; poor posture; increased muscle spasms and falls risk. Patient is independent with her ADLS and general mobility. During treatment session patient verbalized feeling dizzy after supine to sit transfer. When trying to stand she verbalized the same dizziness and required support from PT to maintain balance. Assessed patient's BP readings and both readings were higher than her normal (see above). Educated patient how anxiety and hydration can impact how she is feeling and her BP readings. Patient is motivated and wants to get rid of the pain. Patient will benefit from skilled PT to address the below impairments and improve overall function.   OBJECTIVE IMPAIRMENTS: Abnormal gait, decreased balance, decreased endurance, decreased mobility, decreased ROM, decreased strength, dizziness, increased muscle spasms,  impaired flexibility, impaired sensation, postural dysfunction, and pain.   ACTIVITY LIMITATIONS: lifting, bending, sitting, standing, stairs, transfers, and locomotion level  PARTICIPATION LIMITATIONS: meal prep, cleaning, interpersonal relationship, shopping, and community activity  PERSONAL FACTORS: Age, Fitness, Time since onset of injury/illness/exacerbation, and 3+ comorbidities: depression; HTN; pacemaker; neuropathy  are also affecting patient's functional outcome.   REHAB POTENTIAL: Good  CLINICAL DECISION MAKING: Evolving/moderate complexity  EVALUATION COMPLEXITY: High   GOALS: Goals reviewed with patient? Yes  SHORT TERM GOALS: Target date: 09/03/2023  Patient will be independent with initial HEP. Baseline:  Goal status: INITIAL  2.  Patient will report > or = to 30% improvement in back pain since starting PT. Baseline:  Goal status: INITIAL  3.  Patient will demonstrate control when performing sit to stand. Baseline:  Goal status: INITIAL   LONG TERM GOALS: Target date: 10/01/2023  Patient will demonstrate independence in advanced HEP. Baseline:  Goal status: INITIAL  2.  Patient will report > or = to 50% improvement in back pain since starting PT. Baseline:  Goal status: INITIAL  3.  Patient will verbalize and demonstrate self-care strategies to manage pain including tissue mobility practices and change of position. Baseline:  Goal status: INITIAL  4.  Patient will score < or = to 17sec on 5STS to maintain independence with transfers.  Baseline: 21.54 sec Goal status: INITIAL  5.  Patient will score < or = to 15 sec on TUG to decrease risk of falls. Baseline: 18.18 sec Goal status: INITIAL  6.  Patient will score < or = to 20/50 on ODI to decrease self perceived disability. Baseline: 26/50 52% Goal status: INITIAL  PLAN:  PT FREQUENCY: 1x/week  PT DURATION: 8 weeks  PLANNED INTERVENTIONS: 97164- PT Re-evaluation, 97110-Therapeutic  exercises, 97530- Therapeutic activity, [id number]- Neuromuscular re-education, 97535- Self Care, 16109- Manual therapy, [id number]- Gait training, [phone]- Canalith repositioning, [id number]- Aquatic Therapy, 97016- Vasopneumatic device, [id number]- Ultrasound, [id number]- Traction (mechanical), [id number]- Ionotophoresis 4mg /ml Dexamethasone , Patient/Family education, Balance training, Stair training, Taping, Dry Needling, Joint mobilization, Joint manipulation, Spinal manipulation, Spinal mobilization, Vestibular training, Cryotherapy, and Moist heat.  PLAN FOR NEXT SESSION: create HEP (sit to stand; hamstring stretch ; seated QL stretch; seated TA activation); NuStep; 3 way SB stretch ; step taps  Penelope Bowie, PT 08/06/23 3:37 PM [facility] [address], [address] [place], [place] [address] Phone # [phone] Fax [phone]

## 2023-08-06 ENCOUNTER — Other Ambulatory Visit: Payer: Self-pay

## 2023-08-06 ENCOUNTER — Encounter: Payer: Self-pay | Admitting: Physical Therapy

## 2023-08-06 ENCOUNTER — Ambulatory Visit: Admitting: Physical Therapy

## 2023-08-06 DIAGNOSIS — R262 Difficulty in walking, not elsewhere classified: Secondary | ICD-10-CM

## 2023-08-06 DIAGNOSIS — R252 Cramp and spasm: Secondary | ICD-10-CM

## 2023-08-06 DIAGNOSIS — M6281 Muscle weakness (generalized): Secondary | ICD-10-CM

## 2023-08-06 DIAGNOSIS — R293 Abnormal posture: Secondary | ICD-10-CM

## 2023-08-06 DIAGNOSIS — M5459 Other low back pain: Secondary | ICD-10-CM

## 2023-08-06 DIAGNOSIS — R1313 Dysphagia, pharyngeal phase: Secondary | ICD-10-CM | POA: Diagnosis not present

## 2023-08-07 ENCOUNTER — Other Ambulatory Visit: Payer: Self-pay | Admitting: Obstetrics and Gynecology

## 2023-08-07 ENCOUNTER — Encounter: Payer: Self-pay | Admitting: Pulmonary Disease

## 2023-08-07 ENCOUNTER — Ambulatory Visit (INDEPENDENT_AMBULATORY_CARE_PROVIDER_SITE_OTHER): Payer: Medicare Other

## 2023-08-07 ENCOUNTER — Ambulatory Visit: Admitting: Pulmonary Disease

## 2023-08-07 VITALS — BP 138/84 | HR 67 | Ht <= 58 in | Wt 119.0 lb

## 2023-08-07 DIAGNOSIS — N952 Postmenopausal atrophic vaginitis: Secondary | ICD-10-CM

## 2023-08-07 DIAGNOSIS — R1310 Dysphagia, unspecified: Secondary | ICD-10-CM

## 2023-08-07 DIAGNOSIS — J849 Interstitial pulmonary disease, unspecified: Secondary | ICD-10-CM

## 2023-08-07 DIAGNOSIS — I428 Other cardiomyopathies: Secondary | ICD-10-CM | POA: Diagnosis not present

## 2023-08-07 DIAGNOSIS — M2550 Pain in unspecified joint: Secondary | ICD-10-CM

## 2023-08-07 DIAGNOSIS — R768 Other specified abnormal immunological findings in serum: Secondary | ICD-10-CM

## 2023-08-07 LAB — CUP PACEART REMOTE DEVICE CHECK
Battery Remaining Longevity: 126 mo
Battery Remaining Percentage: 100 %
Brady Statistic RA Percent Paced: 0 %
Brady Statistic RV Percent Paced: 100 %
Date Time Interrogation Session: 20250430024000
HighPow Impedance: 40 Ohm
Implantable Lead Connection Status: 753985
Implantable Lead Connection Status: 753985
Implantable Lead Connection Status: 753985
Implantable Lead Implant Date: 20080116
Implantable Lead Implant Date: 20080116
Implantable Lead Implant Date: 20080116
Implantable Lead Location: 753858
Implantable Lead Location: 753859
Implantable Lead Location: 753860
Implantable Lead Model: 157
Implantable Lead Model: 4469
Implantable Lead Model: 4555
Implantable Lead Serial Number: 136532
Implantable Lead Serial Number: 161542
Implantable Lead Serial Number: 473495
Implantable Pulse Generator Implant Date: 20241030
Lead Channel Impedance Value: 394 Ohm
Lead Channel Impedance Value: 490 Ohm
Lead Channel Impedance Value: 869 Ohm
Lead Channel Pacing Threshold Amplitude: 0.6 V
Lead Channel Pacing Threshold Pulse Width: 0.4 ms
Lead Channel Setting Pacing Amplitude: 2 V
Lead Channel Setting Pacing Amplitude: 2 V
Lead Channel Setting Pacing Amplitude: 2.2 V
Lead Channel Setting Pacing Pulse Width: 0.4 ms
Lead Channel Setting Pacing Pulse Width: 0.8 ms
Lead Channel Setting Sensing Sensitivity: 0.5 mV
Lead Channel Setting Sensing Sensitivity: 1 mV
Pulse Gen Serial Number: 157167

## 2023-08-07 NOTE — Progress Notes (Signed)
 Synopsis: Referred in May 2022 for abnormal CT chest results  Subjective:   PATIENT ID: [name] GENDER: female DOB: 1933/04/27, MRN: [id number]  HPI  Chief Complaint  Patient presents with   Follow-up   [name] is an 89 year old woman, never smoker with HFrEF, dementia, hypertension and osteoarthritis who returns to pulmonary clinic for ILD.  She completed ILD packet. Reports asbestos in her workplace building but denies any parts of it were under construction during her time there.   Inflammatory lab panel shows positive ANA 1:320 titer with homogenous nucleolar pattern. She experiences joint pains in her knees, ankles, and back, as well as dry eyes.  MBS study showed functional oropharyngeal swallow. She reports improvement in her swallowing ability after meeting with SLP outpatient prior to [facility]. She is having less coughing with eating/drinking.   PFTs show mild restriction and a mild reduction in diffusion capacity.  OV 06/26/23 HRCT 01/2023 ordered for ILD, which showed fibrotic interstitial lung disease with slight upper lobe predominance with increased fibrotic component but reduced nodular component. Findings are concerning for sarcoidosis. She has not had a biopsy to confirm sarcoidosis, and there are no enlarged lymph nodes noted on the scan.  She experiences frequent choking, particularly when swallowing, which has increased over time. She uses porridge to help swallow her medications. Past evaluations indicated minimal trouble swallowing at the junction of the throat and esophagus. She is concerned about the potential for aspiration leading to lung issues, given her history of interstitial lung disease. She has not been working with speech therapy recently, although she had a similar swallowing test two years ago.  Her breathing has improved somewhat since her pacemaker was changed, with a reduction in wheezing. However, she experiences intermittent swelling in  her legs, which has also improved somewhat since the pacemaker adjustment. No significant changes in shortness of breath with activity or walking since her last visit, although she occasionally experiences shortness of breath without exertion. No cough or mucus production, except when choking. She reports dry eyes and sleeping with her mouth open, but no wheezing or waking up with shortness of breath at night. She mentions a little swelling but no significant water  damage or mold exposure at home.  She has not been using her inhaler as much recently, but finds it effective when used. She inquires about the need for a new inhaler if it expires, noting that it remains effective for a few months past the expiration date.   OV 01/19/2021 We obtained a HRCT chest scan 09/09/20 which showed extensive clustered, confluent, somewhat ill-defined nodularity throughout the lungs, many nodules concentrated along the fissures with upper lobe predominance. These findings are consistent with sarcoidosis with a mild component of fibrosis.  Pulmonary function tests show mild restrictive and mild diffusion defects.  She denies any issues with shortness of breath.   OV 08/16/20 There were bilateral scattered ground glass opacities noted on the CT renal stone study. She denies any shortness of breath or wheezing. She does have an intermittent cough. She reports trouble with her swallowing in which she had barium swallow completed on 07/27/20 that showed mild aspiration risk with minimal pharyngoesophageal dysphagia. She denies recurrent pneumonias.   She does report diffuse joint pains since she was in her 57s. She reports this is osteoarthritis. She does have a history of GERD and is taking omeprazole  daily. She denies frequent reflux symptoms and does not have nocturnal symptoms that wake her up from sleep.  Past Medical History:  Diagnosis Date   Arthritis    Cancer (HCC)    left breast cancer    Cataracts,  bilateral    removed by surgery   CHF (congestive heart failure) (HCC)    PACEMAKER & DEFIB   Complication of anesthesia    hypotensive after back surgery in 2006   Depression    Dyslipidemia    Fainted 04/21/06   AT [facility]   GERD (gastroesophageal reflux disease)    Headache(784.0)    Hearing loss    bilateral hearing aids   HLD (hyperlipidemia)    diet controlled    Hypertension    Hypothyroidism    ICD (implantable cardiac defibrillator) in place    pt has pacer/icd   ICD (implantable cardiac defibrillator), biventricular, in situ    LBBB (left bundle branch block)    Memory loss    Nonischemic cardiomyopathy (HCC)    Normal coronary arteries    s/p cardiac cath 2007   Pacemaker    ICD [employer]   Syncope    Systolic CHF [facility])    Vertigo    Wears glasses      Family History  Problem Relation Age of Onset   Hypertension Mother    Arthritis Mother    Hypertension Father    Hypertension Brother    Hypertension Brother      Social History   Socioeconomic History   Marital status: Married    Spouse name: Not on file   Number of children: 1   Years of education: Masters   Highest education level: Master's degree (e.g., MA, MS, MEng, MEd, MSW, MBA)  Occupational History   Occupation: Retired  Tobacco Use   Smoking status: Never    Passive exposure: Never   Smokeless tobacco: Never  Vaping Use   Vaping status: Never Used  Substance and Sexual Activity   Alcohol use: No   Drug use: No   Sexual activity: Not Currently    Birth control/protection: Post-menopausal  Other Topics Concern   Not on file  Social History Narrative   Lives at home with husband.   Right-handed.      As of 07/28/2014   Diet: No special diet   Caffeine: yes, Chocolate, tea and sodas    Married: YES, 1970   House: Yes, 2 stories, 2-3 persons live in home   Pets: No   Current/Past profession: Nurse, mental health, Publishing rights manager    Exercise: Yes 2-3 x weekly   Living Will:  Yes   DNR: No   POA/HPOA: No      Social Drivers of Corporate investment banker Strain: Low Risk  (06/06/2023)   Overall Financial Resource Strain (CARDIA)    Difficulty of Paying Living Expenses: Not hard at all  Food Insecurity: No Food Insecurity (06/06/2023)   Hunger Vital Sign    Worried About Running Out of Food in the Last Year: Never true    Ran Out of Food in the Last Year: Never true  Transportation Needs: No Transportation Needs (06/06/2023)   PRAPARE - Administrator, Civil Service (Medical): No    Lack of Transportation (Non-Medical): No  Physical Activity: Unknown (06/06/2023)   Exercise Vital Sign    Days of Exercise per Week: Patient declined    Minutes of Exercise per Session: Not on file  Stress: Patient Declined (06/06/2023)   [employer] of Occupational Health - Occupational Stress Questionnaire    Feeling of Stress :  Patient declined  Social Connections: Moderately Integrated (06/06/2023)   Social Connection and Isolation Panel [NHANES]    Frequency of Communication with Friends and Family: Three times a week    Frequency of Social Gatherings with Friends and Family: Patient declined    Attends Religious Services: More than 4 times per year    Active Member of [employer] or Organizations: Yes    Attends Banker Meetings: More than 4 times per year    Marital Status: Widowed  Intimate Partner Violence: Not At Risk (03/27/2017)   Humiliation, Afraid, Rape, and Kick questionnaire    Fear of Current or Ex-Partner: No    Emotionally Abused: No    Physically Abused: No    Sexually Abused: No     Allergies  Allergen Reactions   Iodinated Contrast Media Shortness Of Breath    Requires pre-medication   Iodine  Shortness Of Breath    Iodine  contrast, CHF , SOB   Shellfish Allergy Shortness Of Breath   Memantine      Malaise, fogginess/ couldn't think   Aspirin  Nausea And Vomiting   Codeine Nausea And Vomiting and Other (See Comments)     Other Reaction(s): Not available     Outpatient Medications Prior to Visit  Medication Sig Dispense Refill   albuterol  (VENTOLIN  HFA) 108 (90 Base) MCG/ACT inhaler Inhale 2 puffs into the lungs every 6 (six) hours as needed for wheezing or shortness of breath. 8 g 11   ascorbic acid (VITAMIN C) 500 MG tablet Take 500 mg by mouth daily.     buPROPion  (WELLBUTRIN  XL) 150 MG 24 hr tablet Take 1 tablet (150 mg total) by mouth daily.     carvedilol  (COREG ) 3.125 MG tablet TAKE 1 TABLET BY MOUTH TWICE  DAILY 180 tablet 3   Cholecalciferol (VITAMIN D3) 50 MCG (2000 UT) TABS Take 2,000 Units by mouth daily.      Clobetasol  Prop Emollient Base (CLOBETASOL  PROPIONATE E) 0.05 % emollient cream Apply 1 Application topically at bedtime. 30 g 11   diclofenac  Sodium (PENNSAID ) 2 % SOLN APPLY 2 PUMPS (40MG ) UP TO TWO PAINFUL AREAS TWICE A DAY 224 g 0   diphenhydrAMINE  (BENADRYL ) 50 MG tablet 50 mg oral diphenhydramine  administered one hour prior to contrast administration. 1 tablet 0   DULoxetine (CYMBALTA) 60 MG capsule Take 60 mg by mouth daily.     estradiol  (ESTRACE ) 0.1 MG/GM vaginal cream Place 0.5 g vaginally 2 (two) times a week. Place 0.5g nightly for two weeks then twice a week after 30 g 11   famotidine (PEPCID) 10 MG tablet Take 10 mg by mouth daily as needed for heartburn or indigestion.     furosemide  (LASIX ) 20 MG tablet Take 1 tablet (20 mg total) by mouth daily as needed. 90 tablet 3   levothyroxine  (SYNTHROID ) 88 MCG tablet Take 1 tablet (88 mcg total) by mouth daily. 90 tablet 3   lidocaine  (XYLOCAINE ) 5 % ointment Apply 1 Application topically as needed (up to 4x/day as needed). 50 g 5   Lidocaine  4 % PTCH Apply 1 patch topically every 12 (twelve) hours. Apply to back     lidocaine -prilocaine  (EMLA ) cream Apply 1 Application topically as needed. 30 g 0   lubiprostone  (AMITIZA ) 8 MCG capsule TAKE ONE CAPSULE BY MOUTH TWICE DAILY WITH A MEAL 180 capsule 3   Magnesium  250 MG TABS Take 250 mg  by mouth daily.     metaxalone  (SKELAXIN ) 800 MG tablet Take 0.5 tablets (400 mg total)  by mouth 3 (three) times daily as needed for muscle spasms. 60 tablet 5   Misc Natural Products (PUMPKIN SEED  OIL) CAPS Take 1 capsule by mouth daily. 180 capsule 0   NALTREXONE  HCL, PAIN, PO Take 4 mg by mouth at bedtime.     NONFORMULARY OR COMPOUNDED ITEM Apply 120 Tubes topically daily. Diclofenac /Cyclobenzaprine/lamotrigine/lidocaine /prilocaine  (10480) 2%/2%/6%/5%/1.25% cream QTY: 120 GM SIG: (NEURO) APPLY 1-2 PUMPS (1-2 GMS) TO AFFECTED AREA (S) OR FOCAL POINTS 3 TO 4 TIMES DAILY. RUB IN FOR 2 MINUTES TO ACHIEVE MAX PENETRATION. WASH HANDS WELL. 1 each 2   Omega-3 Fatty Acids (FISH OIL) 1200 MG CAPS Take 1,200 mg by mouth daily.     omeprazole  (PRILOSEC) 40 MG capsule TAKE 1 CAPSULE BY MOUTH DAILY 90 capsule 3   Polyethyl Glycol-Propyl Glycol (SYSTANE) 0.4-0.3 % SOLN Place 1 drop into both eyes at bedtime.     POLYETHYLENE GLYCOL 3350  PO Take 1 Capful by mouth daily.     pregabalin  (LYRICA ) 25 MG capsule Take 1 capsule (25 mg total) by mouth 2 (two) times daily. 60 capsule 5   senna-docusate (SENOKOT S) 8.6-50 MG tablet Take 2 tablets by mouth daily. 60 tablet 5   spironolactone  (ALDACTONE ) 25 MG tablet TAKE 1 TABLET BY MOUTH IN THE  MORNING 90 tablet 1   telmisartan  (MICARDIS ) 20 MG tablet Take 1 tablet (20 mg total) by mouth daily. 90 tablet 3   Trospium  Chloride 60 MG CP24 Take 1 capsule (60 mg total) by mouth daily. 30 capsule 3   Turmeric (QC TUMERIC COMPLEX PO) Take 1,000 mg by mouth daily.     Vitamin E 400 units TABS Take 400 Units by mouth daily.      Facility-Administered Medications Prior to Visit  Medication Dose Route Frequency Provider Last Rate Last Admin   [START ON 11/28/2023] denosumab  (PROLIA ) injection 60 mg  60 mg Subcutaneous Q6 months Eubanks, Jessica K, NP       Review of Systems  Constitutional:  Negative for chills, fever, malaise/fatigue and weight loss.  HENT:  Negative  for congestion, sinus pain and sore throat.   Eyes: Negative.   Respiratory:  Positive for shortness of breath. Negative for cough, hemoptysis, sputum production and wheezing.   Cardiovascular:  Negative for chest pain, palpitations, orthopnea, claudication and leg swelling.  Gastrointestinal:  Negative for abdominal pain, heartburn, nausea and vomiting.  Genitourinary: Negative.   Musculoskeletal:  Positive for joint pain. Negative for back pain and myalgias.  Skin:  Negative for rash.  Neurological:  Negative for weakness.  Endo/Heme/Allergies: Negative.   Psychiatric/Behavioral: Negative.      Objective:   Vitals:   08/07/23 1318  BP: 138/84  Pulse: 67  SpO2: 98%  Weight: 119 lb (54 kg)  Height: 4\' 10"  (1.473 m)     Physical Exam Constitutional:      General: She is not in acute distress.    Appearance: Normal appearance. She is normal weight. She is not ill-appearing.     Comments: Elderly woman  HENT:     Head: Normocephalic and atraumatic.  Eyes:     General: No scleral icterus.    Conjunctiva/sclera: Conjunctivae normal.  Cardiovascular:     Rate and Rhythm: Normal rate and regular rhythm.     Pulses: Normal pulses.     Heart sounds: Normal heart sounds. No murmur heard. Pulmonary:     Effort: Pulmonary effort is normal.     Breath sounds: Rales (mild basilar) present. No wheezing or  rhonchi.  Musculoskeletal:     Right lower leg: No edema.     Left lower leg: No edema.  Skin:    General: Skin is warm and dry.  Neurological:     General: No focal deficit present.     Mental Status: She is alert.     CBC    Component Value Date/Time   WBC 7.3 05/27/2023 1110   RBC 4.12 05/27/2023 1110   HGB 11.8 05/27/2023 1110   HGB 12.5 01/23/2023 0956   HGB 12.5 08/24/2011 1332   HCT 37.0 05/27/2023 1110   HCT 38.9 01/23/2023 0956   HCT 37.5 08/24/2011 1332   PLT 142 05/27/2023 1110   PLT 128 (L) 01/23/2023 0956   MCV 89.8 05/27/2023 1110   MCV 92 01/23/2023  0956   MCV 87.1 08/24/2011 1332   MCH 28.6 05/27/2023 1110   MCHC 31.9 (L) 05/27/2023 1110   RDW 13.2 05/27/2023 1110   RDW 13.6 01/23/2023 0956   RDW 15.6 (H) 08/24/2011 1332   LYMPHSABS 1.8 01/16/2023 1130   LYMPHSABS 1.9 10/05/2015 0945   LYMPHSABS 2.5 08/24/2011 1332   MONOABS 0.5 01/16/2023 1130   MONOABS 0.6 08/24/2011 1332   EOSABS 212 05/27/2023 1110   EOSABS 0.3 10/05/2015 0945   BASOSABS 88 05/27/2023 1110   BASOSABS 0.0 10/05/2015 0945   BASOSABS 0.1 08/24/2011 1332      Latest Ref Rng & Units 05/27/2023   11:10 AM 01/23/2023    9:56 AM 01/16/2023   11:30 AM  BMP  Glucose 65 - 139 mg/dL 696  295  96   BUN 7 - 25 mg/dL 21  18  17    Creatinine 0.60 - 0.95 mg/dL 2.84  1.32  4.40   BUN/Creat Ratio 6 - 22 (calc) SEE NOTE:  22    Sodium 135 - 146 mmol/L 134  136  133   Potassium 3.5 - 5.3 mmol/L 4.6  4.6  4.4   Chloride 98 - 110 mmol/L 98  99  99   CO2 20 - 32 mmol/L 30  25  31    Calcium 8.6 - 10.4 mg/dL 10.2  72.5  36.6    Chest imaging: HRCT Chest 01/28/23 1. Fibrotic interstitial lung disease with slight upper lobe predominance, when compared with September 09, 2020 prior high-resolution chest CT nodularity has decreased there is increased fibrotic component. 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 Findings are suggestive of an alternative diagnosis (not UIP) per consensus guidelines: Diagnosis of Idiopathic Pulmonary Fibrosis: An Official ATS/ERS/JRS/ALAT Clinical Practice Guideline. Am Annie Barton Crit Care Med Vol 198, Iss 5, [phone], Dec 08 2016. 2. Coronary artery calcifications and aortic Atherosclerosis (ICD10-I70.0).  HRCT Chest 09/09/20 1. There is very extensive, clustered, confluent, somewhat ill-defined nodularity throughout the lungs, many nodules concentrated along the fissures and with a slight upper lobe predominance. There is some associated irregular peripheral interstitial opacity. Findings suggest pulmonary sarcoidosis with a mild component of  fibrosis. Unusual appearance and distribution of an atypical infection is a differential consideration. 2. Coronary artery disease.  CT Renal Stone Study 06/09/31 Lower chest: Bilateral ground-glass opacities peripherally within both lower lungs. Heart is normal size. Pacer wires noted in the heart. No effusions.  PFT:    Latest Ref Rng & Units 07/17/2023    3:19 PM 01/19/2021   11:44 AM  PFT Results  FVC-Pre L 1.48  1.90   FVC-Predicted Pre % 93  142   FVC-Post L 1.78  1.93   FVC-Predicted Post %  111  144   Pre FEV1/FVC % % 99  88   Post FEV1/FCV % % 92  91   FEV1-Pre L 1.48  1.67   FEV1-Predicted Pre % 128  164   FEV1-Post L 1.65  1.75   DLCO uncorrected ml/min/mmHg 9.15  11.26   DLCO UNC% % 61  72   DLCO corrected ml/min/mmHg 9.66  11.48   DLCO COR %Predicted % 65  73   DLVA Predicted % 92  87   TLC L 3.10  3.36   TLC % Predicted % 74  78   RV % Predicted % 60  67     Labs:  Path:  Echo 08/26/13: EF 45-50%, mild global hypokinesis. Incoordinate septal motion.    ICD wires noted. Diastolic dysfunction with normal LV filling    pressure.  Assessment & Plan:   Interstitial pulmonary disease (HCC) - Plan: Ambulatory referral to Rheumatology  Arthralgia, unspecified joint - Plan: Ambulatory referral to Rheumatology  Positive ANA (antinuclear antibody) - Plan: Ambulatory referral to Rheumatology  Discussion: Alilia Nish is an 89 year old woman, never smoker with HFrEF, dementia, hypertension and osteoarthritis who returns to pulmonary clinic for ILD.  Interstitial Lung Disease CT scan consistent with interstitial lung disease. ANA positive 1:320 with homogenous nucleolar pattern and complaints of multiple joint pains. Mild restriction and diffusion defect. - Pending case review at ILD conference - refer to rheumatology for evaluation of positive ANA - Will consider potential immune suppression based on rheumatology evaluation vs antifibrotic therapy - ILD  questionnaire completed, history of asbestos in work building  Positive ANA with joint pain - Refer to rheumatologist for further evaluation of joint pain and positive ANA.  Dysphagia - improved at this time - MBS and outpatient SLP evaluation without concern for aspiration at this time.  Goals of Care Discussed risks and benefits of proactive versus conservative management of dysphagia and interstitial lung disease. Emphasized monitoring and reassessment based on symptom progression. Shared decision-making approach adopted. - Engage in shared decision-making regarding management of dysphagia and interstitial lung disease.  Lightheadedness with positional changes Lightheadedness with positional changes not explained by blood pressure. Considered transient blood pressure changes or dehydration. - Checked oxygen levels during office walk, no desaturations - Ensure adequate hydration and nutrition before physical therapy. - Monitor symptoms and report if she worsens or occurs with other exercises.  Follow up in 3 months.   Duaine German, MD [facility] Office: [phone]   Current Outpatient Medications:    albuterol  (VENTOLIN  HFA) 108 (90 Base) MCG/ACT inhaler, Inhale 2 puffs into the lungs every 6 (six) hours as needed for wheezing or shortness of breath., Disp: 8 g, Rfl: 11   ascorbic acid (VITAMIN C) 500 MG tablet, Take 500 mg by mouth daily., Disp: , Rfl:    buPROPion  (WELLBUTRIN  XL) 150 MG 24 hr tablet, Take 1 tablet (150 mg total) by mouth daily., Disp: , Rfl:    carvedilol  (COREG ) 3.125 MG tablet, TAKE 1 TABLET BY MOUTH TWICE  DAILY, Disp: 180 tablet, Rfl: 3   Cholecalciferol (VITAMIN D3) 50 MCG (2000 UT) TABS, Take 2,000 Units by mouth daily. , Disp: , Rfl:    Clobetasol  Prop Emollient Base (CLOBETASOL  PROPIONATE E) 0.05 % emollient cream, Apply 1 Application topically at bedtime., Disp: 30 g, Rfl: 11   diclofenac  Sodium (PENNSAID ) 2 % SOLN, APPLY 2  PUMPS (40MG ) UP TO TWO PAINFUL AREAS TWICE A DAY, Disp: 224 g, Rfl: 0  diphenhydrAMINE  (BENADRYL ) 50 MG tablet, 50 mg oral diphenhydramine  administered one hour prior to contrast administration., Disp: 1 tablet, Rfl: 0   DULoxetine (CYMBALTA) 60 MG capsule, Take 60 mg by mouth daily., Disp: , Rfl:    estradiol  (ESTRACE ) 0.1 MG/GM vaginal cream, Place 0.5 g vaginally 2 (two) times a week. Place 0.5g nightly for two weeks then twice a week after, Disp: 30 g, Rfl: 11   famotidine (PEPCID) 10 MG tablet, Take 10 mg by mouth daily as needed for heartburn or indigestion., Disp: , Rfl:    furosemide  (LASIX ) 20 MG tablet, Take 1 tablet (20 mg total) by mouth daily as needed., Disp: 90 tablet, Rfl: 3   levothyroxine  (SYNTHROID ) 88 MCG tablet, Take 1 tablet (88 mcg total) by mouth daily., Disp: 90 tablet, Rfl: 3   lidocaine  (XYLOCAINE ) 5 % ointment, Apply 1 Application topically as needed (up to 4x/day as needed)., Disp: 50 g, Rfl: 5   Lidocaine  4 % PTCH, Apply 1 patch topically every 12 (twelve) hours. Apply to back, Disp: , Rfl:    lidocaine -prilocaine  (EMLA ) cream, Apply 1 Application topically as needed., Disp: 30 g, Rfl: 0   lubiprostone  (AMITIZA ) 8 MCG capsule, TAKE ONE CAPSULE BY MOUTH TWICE DAILY WITH A MEAL, Disp: 180 capsule, Rfl: 3   Magnesium  250 MG TABS, Take 250 mg by mouth daily., Disp: , Rfl:    metaxalone  (SKELAXIN ) 800 MG tablet, Take 0.5 tablets (400 mg total) by mouth 3 (three) times daily as needed for muscle spasms., Disp: 60 tablet, Rfl: 5   Misc Natural Products (PUMPKIN SEED  OIL) CAPS, Take 1 capsule by mouth daily., Disp: 180 capsule, Rfl: 0   NALTREXONE  HCL, PAIN, PO, Take 4 mg by mouth at bedtime., Disp: , Rfl:    NONFORMULARY OR COMPOUNDED ITEM, Apply 120 Tubes topically daily. Diclofenac /Cyclobenzaprine/lamotrigine/lidocaine /prilocaine  (10480) 2%/2%/6%/5%/1.25% cream QTY: 120 GM SIG: (NEURO) APPLY 1-2 PUMPS (1-2 GMS) TO AFFECTED AREA (S) OR FOCAL POINTS 3 TO 4 TIMES DAILY. RUB IN  FOR 2 MINUTES TO ACHIEVE MAX PENETRATION. WASH HANDS WELL., Disp: 1 each, Rfl: 2   Omega-3 Fatty Acids (FISH OIL) 1200 MG CAPS, Take 1,200 mg by mouth daily., Disp: , Rfl:    omeprazole  (PRILOSEC) 40 MG capsule, TAKE 1 CAPSULE BY MOUTH DAILY, Disp: 90 capsule, Rfl: 3   Polyethyl Glycol-Propyl Glycol (SYSTANE) 0.4-0.3 % SOLN, Place 1 drop into both eyes at bedtime., Disp: , Rfl:    POLYETHYLENE GLYCOL 3350  PO, Take 1 Capful by mouth daily., Disp: , Rfl:    pregabalin  (LYRICA ) 25 MG capsule, Take 1 capsule (25 mg total) by mouth 2 (two) times daily., Disp: 60 capsule, Rfl: 5   senna-docusate (SENOKOT S) 8.6-50 MG tablet, Take 2 tablets by mouth daily., Disp: 60 tablet, Rfl: 5   spironolactone  (ALDACTONE ) 25 MG tablet, TAKE 1 TABLET BY MOUTH IN THE  MORNING, Disp: 90 tablet, Rfl: 1   telmisartan  (MICARDIS ) 20 MG tablet, Take 1 tablet (20 mg total) by mouth daily., Disp: 90 tablet, Rfl: 3   Trospium  Chloride 60 MG CP24, Take 1 capsule (60 mg total) by mouth daily., Disp: 30 capsule, Rfl: 3   Turmeric (QC TUMERIC COMPLEX PO), Take 1,000 mg by mouth daily., Disp: , Rfl:    Vitamin E 400 units TABS, Take 400 Units by mouth daily. , Disp: , Rfl:   Current Facility-Administered Medications:    [START ON 11/28/2023] denosumab  (PROLIA ) injection 60 mg, 60 mg, Subcutaneous, Q6 months, Eubanks, Jessica K, NP

## 2023-08-07 NOTE — Patient Instructions (Addendum)
 We will refer you to rheumatology for further evaluation of the positive ANA test for possible autoimmune inflammatory condition  Your breathing tests show mild restrictive lung disease and mild diffusion defect  Thank you for completing the ILD questionnaire  Your swallowing ability appears to have improved, if it worsens please let us  know and we will have you seen by Speech therapy again  Follow up in 3 months

## 2023-08-11 ENCOUNTER — Encounter: Payer: Self-pay | Admitting: Internal Medicine

## 2023-08-12 ENCOUNTER — Other Ambulatory Visit: Payer: Self-pay

## 2023-08-12 DIAGNOSIS — C50912 Malignant neoplasm of unspecified site of left female breast: Secondary | ICD-10-CM

## 2023-08-18 NOTE — Progress Notes (Signed)
 HEMATOLOGY/ONCOLOGY CLINIC NOTE  Date of Service: 08/19/2023   Patient Care Team: Verma Gobble, NP as PCP - General (Geriatric Medicine) Tammie Fall, MD as PCP - Electrophysiology (Cardiology) Lockie Rima, MD as Consulting Physician (General Surgery) Claiborne Crew, MD as Consulting Physician (Orthopedic Surgery) Princella Brooklyn, OD (Optometry) Phebe Brasil, MD as Consulting Physician (Neurology) Tammie Fall, MD as Consulting Physician (Cardiology) Arlen Belton, MD (Psychiatry) Wilfredo Hanly, MD as Consulting Physician (Pulmonary Disease)  CHIEF COMPLAINTS/PURPOSE OF CONSULTATION:   Continued evaluation and management of her stage 1B high-grade triple negative breast cancer  HISTORY OF PRESENTING ILLNESS:   [name] is a wonderful 89 y.o. female who has been referred to us  by Gilbert Lab, NP for evaluation and management of easily bruising. The pt reports that she is doing well overall.   The pt reports that she has seen bruises on her arm, and denies bumping into things or any trauma. The pt notes that her bruises are solely located to her upper extremities. She denies concerns for bleeding in her joints, blood in the stools, blood in the urine, nose bleeds or gum bleeds. She notes she has bruised easily for about 2 years. She also notes that she began taking Zoloft  about two years ago, and fish oil 4-5 years ago. She is not on any blood thinners. She denies taking any other new medications in the last couple years. The pt denies any thick bruises at any time. The pt denies excessive bleeding with pervious surgeries and dental extractions. The pt denies heavy periods when she was younger.  She has had two knee replacements and took Tramadol  for her pain. She denies needing to use this frequently, and takes Tylenol  for mild pains.    The pt takes Vitamin E oil for hot flashes, and notes that her Vitamin E oil successfully resolved her hot  flashes. She began taking Vitamin E about 18 months ago.   The pt notes that she has lost about 20 pounds over two years. The pt reports some constipation and denies difficulty swallowing, and weak appetite. The pt denies any dietary restrictions. She continues annual mammograms. She has a history of left sided breast cancer treated with a lumpectomy and radiation.  She notes that her energy levels have also decreased in the last 6 months, and "feels tired all the time." She endorses feeling well rested after taking naps. She notes that she does feel depressed but that taking Zoloft  keeps her "afloat." She notes that she feels "medium" enjoyment in her activities. She sees psychiatry every 6 months.  The pt lives with her husband.   Most recent lab results (02/05/18) of CBC w/diff and CMP is as follows: all values are WNL except for PLT at 117k, BUN at 30, Creatinine at 0.96.  On review of systems, pt reports some stable depression, easily bruising on upper extremities, some weight loss, and denies nose bleeds, gum bleeds, blood in the urine, blood in the stools, abdominal pains, leg swelling, and any other symptoms.   On Family Hx the pt denies bleeding disorders.   INTERVAL HISTORY:   [name] is a wonderful 89 y.o. female here for continued evaluation and management of her stage I 1B high-grade triple negative breast cancer.   Patient was last seen by me on 01/16/2023 . Patient is here with her daughter and notes no acute new focal issues.  Does not note any breast lumps.  Still has some  lymphedema in her left upper extremity on and off. Notes issues with memory and some age-related fatigue. Mammogram from April 09, 2023 showed no concerning mammographic evidence of breast lumps or lymphadenopathy.   MEDICAL HISTORY:  Past Medical History:  Diagnosis Date   Arthritis    Cancer (HCC)    left breast cancer    Cataracts, bilateral    removed by surgery   CHF (congestive  heart failure) (HCC)    PACEMAKER & DEFIB   Complication of anesthesia    hypotensive after back surgery in 2006   Depression    Dyslipidemia    Fainted 04/21/06   AT [facility]   GERD (gastroesophageal reflux disease)    Headache(784.0)    Hearing loss    bilateral hearing aids   HLD (hyperlipidemia)    diet controlled    Hypertension    Hypothyroidism    ICD (implantable cardiac defibrillator) in place    pt has pacer/icd   ICD (implantable cardiac defibrillator), biventricular, in situ    LBBB (left bundle branch block)    Memory loss    Nonischemic cardiomyopathy (HCC)    Normal coronary arteries    s/p cardiac cath 2007   Pacemaker    ICD [employer]   Syncope    Systolic CHF [facility])    Vertigo    Wears glasses     SURGICAL HISTORY: Past Surgical History:  Procedure Laterality Date   BACK SURGERY     lumbar fusion    BIV ICD GENERATOR CHANGEOUT N/A 02/06/2023   Procedure: BIV ICD GENERATOR CHANGEOUT;  Surgeon: Tammie Fall, MD;  Location: [facility] INVASIVE CV LAB;  Service: Cardiovascular;  Laterality: N/A;   BREAST LUMPECTOMY Left 2008   BREAST SURGERY  2000   LUMP REMOVAL. STAGE 1 CANCER   CARDIAC CATHETERIZATION     CATARACT EXTRACTION     COLONOSCOPY     EYE SURGERY     IMPLANTABLE CARDIOVERTER DEFIBRILLATOR GENERATOR CHANGE N/A 12/18/2012   Procedure: IMPLANTABLE CARDIOVERTER DEFIBRILLATOR GENERATOR CHANGE;  Surgeon: Tammie Fall, MD;  Location: [facility] CATH LAB;  Service: Cardiovascular;  Laterality: N/A;   IR INJECT/THERA/INC NEEDLE/CATH/PLC EPI/LUMB/SAC W/IMG  06/28/2023   JOINT REPLACEMENT  06/14/01   right   LUMBAR FUSION  2006   MASS EXCISION  11/08/2011   Procedure: EXCISION MASS;  Surgeon: Lockie Rima, MD;  Location: [facility] ORS;  Service: General;  Laterality: Left;  Excision Left Thigh Mass   MASTECTOMY W/ SENTINEL NODE BIOPSY Left 06/04/2019   Procedure: LEFT MASTECTOMY WITH SENTINEL LYMPH NODE BIOPSY;  Surgeon: Lockie Rima, MD;  Location: [facility] OR;  Service:  General;  Laterality: Left;   MASTECTOMY, PARTIAL  2008   GOT PACEMAKER AND DEFIB AT THAT TIME   PACEMAKER INSERTION  04/23/06   TOTAL KNEE ARTHROPLASTY  05/17/01   RIGHT KNEE   TOTAL KNEE ARTHROPLASTY Left 11/29/2014   Procedure: TOTAL LEFT KNEE ARTHROPLASTY;  Surgeon: Claiborne Crew, MD;  Location: [facility] ORS;  Service: Orthopedics;  Laterality: Left;    SOCIAL HISTORY: Social History   Socioeconomic History   Marital status: Married    Spouse name: Not on file   Number of children: 1   Years of education: Masters   Highest education level: Master's degree (e.g., MA, MS, MEng, MEd, MSW, MBA)  Occupational History   Occupation: Retired  Tobacco Use   Smoking status: Never    Passive exposure: Never   Smokeless tobacco: Never  Vaping Use   Vaping status:  Never Used  Substance and Sexual Activity   Alcohol use: No   Drug use: No   Sexual activity: Not Currently    Birth control/protection: Post-menopausal  Other Topics Concern   Not on file  Social History Narrative   Lives at home with husband.   Right-handed.      As of 07/28/2014   Diet: No special diet   Caffeine: yes, Chocolate, tea and sodas    Married: YES, 1970   House: Yes, 2 stories, 2-3 persons live in home   Pets: No   Current/Past profession: Nurse, mental health, Publishing rights manager    Exercise: Yes 2-3 x weekly   Living Will: Yes   DNR: No   POA/HPOA: No      Social Drivers of Corporate investment banker Strain: Low Risk  (06/06/2023)   Overall Financial Resource Strain (CARDIA)    Difficulty of Paying Living Expenses: Not hard at all  Food Insecurity: No Food Insecurity (06/06/2023)   Hunger Vital Sign    Worried About Running Out of Food in the Last Year: Never true    Ran Out of Food in the Last Year: Never true  Transportation Needs: No Transportation Needs (06/06/2023)   PRAPARE - Administrator, Civil Service (Medical): No    Lack of Transportation (Non-Medical): No  Physical Activity: Unknown  (06/06/2023)   Exercise Vital Sign    Days of Exercise per Week: Patient declined    Minutes of Exercise per Session: Not on file  Stress: Patient Declined (06/06/2023)   [employer] of Occupational Health - Occupational Stress Questionnaire    Feeling of Stress : Patient declined  Social Connections: Moderately Integrated (06/06/2023)   Social Connection and Isolation Panel [NHANES]    Frequency of Communication with Friends and Family: Three times a week    Frequency of Social Gatherings with Friends and Family: Patient declined    Attends Religious Services: More than 4 times per year    Active Member of [employer] or Organizations: Yes    Attends Banker Meetings: More than 4 times per year    Marital Status: Widowed  Intimate Partner Violence: Not At Risk (03/27/2017)   Humiliation, Afraid, Rape, and Kick questionnaire    Fear of Current or Ex-Partner: No    Emotionally Abused: No    Physically Abused: No    Sexually Abused: No    FAMILY HISTORY: Family History  Problem Relation Age of Onset   Hypertension Mother    Arthritis Mother    Hypertension Father    Hypertension Brother    Hypertension Brother     ALLERGIES:  is allergic to iodinated contrast media, iodine , shellfish allergy, memantine , aspirin , and codeine.  MEDICATIONS:  Current Outpatient Medications  Medication Sig Dispense Refill   albuterol  (VENTOLIN  HFA) 108 (90 Base) MCG/ACT inhaler Inhale 2 puffs into the lungs every 6 (six) hours as needed for wheezing or shortness of breath. 8 g 11   ascorbic acid (VITAMIN C) 500 MG tablet Take 500 mg by mouth daily.     buPROPion  (WELLBUTRIN  XL) 150 MG 24 hr tablet Take 1 tablet (150 mg total) by mouth daily.     carvedilol  (COREG ) 3.125 MG tablet TAKE 1 TABLET BY MOUTH TWICE  DAILY 180 tablet 3   Cholecalciferol (VITAMIN D3) 50 MCG (2000 UT) TABS Take 2,000 Units by mouth daily.      Clobetasol  Prop Emollient Base (CLOBETASOL  PROPIONATE E) 0.05 %  emollient cream Apply  1 Application topically at bedtime. 30 g 11   diclofenac  Sodium (PENNSAID ) 2 % SOLN APPLY 2 PUMPS (40MG ) UP TO TWO PAINFUL AREAS TWICE A DAY 224 g 0   diphenhydrAMINE  (BENADRYL ) 50 MG tablet 50 mg oral diphenhydramine  administered one hour prior to contrast administration. 1 tablet 0   DULoxetine (CYMBALTA) 60 MG capsule Take 60 mg by mouth daily.     estradiol  (ESTRACE ) 0.1 MG/GM vaginal cream Place 0.5g nightly for two weeks then twice a week after 30 g 11   famotidine (PEPCID) 10 MG tablet Take 10 mg by mouth daily as needed for heartburn or indigestion.     furosemide  (LASIX ) 20 MG tablet Take 1 tablet (20 mg total) by mouth daily as needed. 90 tablet 3   levothyroxine  (SYNTHROID ) 88 MCG tablet Take 1 tablet (88 mcg total) by mouth daily. 90 tablet 3   lidocaine  (XYLOCAINE ) 5 % ointment Apply 1 Application topically as needed (up to 4x/day as needed). 50 g 5   Lidocaine  4 % PTCH Apply 1 patch topically every 12 (twelve) hours. Apply to back     lidocaine -prilocaine  (EMLA ) cream Apply 1 Application topically as needed. 30 g 0   lubiprostone  (AMITIZA ) 8 MCG capsule TAKE ONE CAPSULE BY MOUTH TWICE DAILY WITH A MEAL 180 capsule 3   Magnesium  250 MG TABS Take 250 mg by mouth daily.     metaxalone  (SKELAXIN ) 800 MG tablet Take 0.5 tablets (400 mg total) by mouth 3 (three) times daily as needed for muscle spasms. 60 tablet 5   Misc Natural Products (PUMPKIN SEED  OIL) CAPS Take 1 capsule by mouth daily. 180 capsule 0   NALTREXONE  HCL, PAIN, PO Take 4 mg by mouth at bedtime.     NONFORMULARY OR COMPOUNDED ITEM Apply 120 Tubes topically daily. Diclofenac /Cyclobenzaprine/lamotrigine/lidocaine /prilocaine  (10480) 2%/2%/6%/5%/1.25% cream QTY: 120 GM SIG: (NEURO) APPLY 1-2 PUMPS (1-2 GMS) TO AFFECTED AREA (S) OR FOCAL POINTS 3 TO 4 TIMES DAILY. RUB IN FOR 2 MINUTES TO ACHIEVE MAX PENETRATION. WASH HANDS WELL. 1 each 2   Omega-3 Fatty Acids (FISH OIL) 1200 MG CAPS Take 1,200 mg by mouth  daily.     omeprazole  (PRILOSEC) 40 MG capsule TAKE 1 CAPSULE BY MOUTH DAILY 90 capsule 3   Polyethyl Glycol-Propyl Glycol (SYSTANE) 0.4-0.3 % SOLN Place 1 drop into both eyes at bedtime.     POLYETHYLENE GLYCOL 3350  PO Take 1 Capful by mouth daily.     pregabalin  (LYRICA ) 25 MG capsule Take 1 capsule (25 mg total) by mouth 2 (two) times daily. 60 capsule 5   senna-docusate (SENOKOT S) 8.6-50 MG tablet Take 2 tablets by mouth daily. 60 tablet 5   spironolactone  (ALDACTONE ) 25 MG tablet TAKE 1 TABLET BY MOUTH IN THE  MORNING 90 tablet 1   telmisartan  (MICARDIS ) 20 MG tablet Take 1 tablet (20 mg total) by mouth daily. 90 tablet 3   Trospium  Chloride 60 MG CP24 Take 1 capsule (60 mg total) by mouth daily. 30 capsule 3   Turmeric (QC TUMERIC COMPLEX PO) Take 1,000 mg by mouth daily.     Vitamin E 400 units TABS Take 400 Units by mouth daily.      Current Facility-Administered Medications  Medication Dose Route Frequency Provider Last Rate Last Admin   [START ON 11/28/2023] denosumab  (PROLIA ) injection 60 mg  60 mg Subcutaneous Q6 months Roselie Conger, Jessica K, NP        REVIEW OF SYSTEMS:    10 Point review of Systems was done  is negative except as noted above.   PHYSICAL EXAMINATION  .BP 132/75   Pulse 74   Temp (!) 97.5 F (36.4 C)   Resp 18   Wt 119 lb 11.2 oz (54.3 kg)   LMP  (LMP Unknown)   SpO2 99%   BMI 25.02 kg/m  GENERAL:alert, in no acute distress and comfortable SKIN: no acute rashes, no significant lesions EYES: conjunctiva are pink and non-injected, sclera anicteric OROPHARYNX: MMM, no exudates, no oropharyngeal erythema or ulceration NECK: supple, no JVD LYMPH:  no palpable lymphadenopathy in the cervical, axillary or inguinal regions LUNGS: clear to auscultation b/l with normal respiratory effort HEART: regular rate & rhythm ABDOMEN:  normoactive bowel sounds , non tender, not distended. Extremity: no pedal edema PSYCH: alert & oriented x 3 with fluent speech NEURO:  no focal motor/sensory deficits   LABORATORY DATA:  I have reviewed the data as listed  .    Latest Ref Rng & Units 08/19/2023    9:27 AM 05/27/2023   11:10 AM 01/23/2023    9:56 AM  CBC  WBC 4.0 - 10.5 K/uL 9.0  7.3  9.3   Hemoglobin 12.0 - 15.0 g/dL 40.9  81.1  91.4   Hematocrit 36.0 - 46.0 % 36.5  37.0  38.9   Platelets 150 - 400 K/uL 135  142  128     .    Latest Ref Rng & Units 08/19/2023    9:27 AM 05/27/2023   11:10 AM 01/23/2023    9:56 AM  CMP  Glucose 70 - 99 mg/dL 782  956  213   BUN 8 - 23 mg/dL 22  21  18    Creatinine 0.44 - 1.00 mg/dL 0.86  5.78  4.69   Sodium 135 - 145 mmol/L 134  134  136   Potassium 3.5 - 5.1 mmol/L 4.1  4.6  4.6   Chloride 98 - 111 mmol/L 101  98  99   CO2 22 - 32 mmol/L 31  30  25    Calcium 8.9 - 10.3 mg/dL 9.8  62.9  52.8   Total Protein 6.5 - 8.1 g/dL 7.0  6.9  7.0   Total Bilirubin 0.0 - 1.2 mg/dL 0.4  0.4  0.2   Alkaline Phos 38 - 126 U/L 39   76   AST 15 - 41 U/L 19  17  23    ALT 0 - 44 U/L 16  15  18       03/18/2020 Mammogram  05/30/2021 Mammogram        RADIOGRAPHIC STUDIES: I have personally reviewed the radiological images as listed and agreed with the findings in the report.  ASSESSMENT & PLAN:   89 y.o. female with  1. H/o Easily bruising - labs did not demonstrate any specific bleeding diathesis 2. Stage IB ER/PR/Her 2 negative, grade 3 left breast cancer s/p mastectomy. NegSNLBx. 06/04/2019. Was not considered to be a good candidate for adjuvant chemotherapy given her age and medical issues. 3. LUE lymphedema-left breast mastectomy and lymph node biopsy.  Controlled with use of lymphedema sleeve.  Discussed maintaining compliance with use of her sleeve.  2)  Patient Active Problem List   Diagnosis Date Noted   Piriformis syndrome of both sides 01/30/2023   Neuropathy 08/01/2022   Atherosclerosis of native artery of both lower extremities with rest pain (HCC) 07/09/2022   Interstitial pulmonary disease  (HCC) 07/09/2022   Sciatica associated with disorder of multiple sites of spine 03/20/2021   Bipolar  II disorder (HCC) 03/20/2021   Chronic pain syndrome 03/20/2021   Urinary retention 02/04/2020   Seasonal allergies 02/04/2020   Recurrent major depressive disorder, in partial remission (HCC) 07/17/2019   Status post left mastectomy 07/01/2019   Breast cancer of lower-outer quadrant of left female breast (HCC) 06/04/2019   Candida infection, oral 01/15/2019   Senile purpura (HCC) 04/14/2018   Lumbar post-laminectomy syndrome 12/03/2017   Lumbar spondylosis 12/03/2017   History of back surgery 09/01/2017   Constipation 09/01/2017   Hypothyroidism due to acquired atrophy of thyroid 09/01/2017   Mixed hyperlipidemia 09/01/2017   Age-related osteoporosis without current pathological fracture 09/01/2017   High risk medication use 09/01/2017   Arthritis of hand 05/17/2017   Atherosclerosis of native arteries of extremity with intermittent claudication (HCC) 05/15/2017   Status post total bilateral knee replacement 12/20/2016   Gait abnormality 07/16/2016   Chronic low back pain 07/16/2016   Mild cognitive impairment 07/16/2016   Wrist pain 05/10/2015   S/P left TKA 11/29/2014   S/P knee replacement 11/29/2014   Spontaneous bruising 08/27/2014   Numbness and tingling in right hand 07/28/2014   Essential tremor 07/28/2014   Dizziness 05/28/2014   Shaky 05/28/2014   Memory loss 05/28/2014   Depression 05/06/2014   Lipoma of left upper thigh 3x5 cm 09/28/2011   Cerebral vascular accident (HCC) 08/09/2011   Syncope 08/09/2011   History of breast cancer [id number], s/p BCT 2008, triple negative 01/26/2011   Chronic L breast pain with chronic recurrent seroma, s/p excisional biopsy 01/12/2010 01/26/2011   Fainted    ICD (implantable cardioverter-defibrillator), biventricular, in situ 08/02/2010   Chronic systolic heart failure (HCC) 08/02/2010   Essential hypertension 08/02/2010    -continue f/u with PCP for mx of other chronic medical issues.  PLAN:  -discussed lab results from today, 08/19/2023, in detail with patient.  CBC and CMP are stable Patient has no clinical symptoms suggestive of breast cancer progression at this time. -Mammogram in December 2024 showed no evidence of new breast lesions. -She will be due for her screening mammogram in December 2025 with her primary care physician -She is following with pulmonology for concern of interstitial lung disease and had a CT chest with pulmonology in October 2024. -01/28/2023 CT chest high resolution scan showed: 1. Fibrotic interstitial lung disease with slight upper lobe predominance, when compared with September 09, 2020 prior high-resolution chest CT nodularity has decreased there is increased fibrotic component. 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 Findings are suggestive of an alternative diagnosis (not UIP) per consensus guidelines: Diagnosis of Idiopathic Pulmonary Fibrosis: -Continue to follow with pulmonology for management of her pulmonary issues. - Continue follow-up with PCP for management of other chronic medical issues.  FOLLOW-UP:  MMG in 03/2024 RTC with dr Salomon Cree with labs in Jan 2026   The total time spent in the appointment was 30 minutes* .  All of the patient's questions were answered with apparent satisfaction. The patient knows to call the clinic with any problems, questions or concerns.   Jacquelyn Matt MD MS AAHIVMS [facility] Hematology/Oncology Physician [facility]  .*Total Encounter Time as defined by the Centers for Medicare and Medicaid Services includes, in addition to the face-to-face time of a patient visit (documented in the note above) non-face-to-face time: obtaining and reviewing outside history, ordering and reviewing medications, tests or procedures, care coordination (communications with other health care professionals or caregivers) and documentation in the  medical record.    I,Mitra Faeizi,acting as a  scribe for Jacquelyn Matt, MD.,have documented all relevant documentation on the behalf of Jacquelyn Matt, MD,as directed by  Jacquelyn Matt, MD while in the presence of Jacquelyn Matt, MD.  .I have reviewed the above documentation for accuracy and completeness, and I agree with the above. .Beckie Viscardi Kishore Tamico Mundo MD

## 2023-08-19 ENCOUNTER — Inpatient Hospital Stay (HOSPITAL_BASED_OUTPATIENT_CLINIC_OR_DEPARTMENT_OTHER): Payer: Medicare Other | Admitting: Hematology

## 2023-08-19 ENCOUNTER — Inpatient Hospital Stay: Payer: Medicare Other | Attending: Internal Medicine

## 2023-08-19 VITALS — BP 132/75 | HR 74 | Temp 97.5°F | Resp 18 | Wt 119.7 lb

## 2023-08-19 DIAGNOSIS — Z171 Estrogen receptor negative status [ER-]: Secondary | ICD-10-CM

## 2023-08-19 DIAGNOSIS — Z853 Personal history of malignant neoplasm of breast: Secondary | ICD-10-CM | POA: Insufficient documentation

## 2023-08-19 DIAGNOSIS — C50912 Malignant neoplasm of unspecified site of left female breast: Secondary | ICD-10-CM | POA: Diagnosis not present

## 2023-08-19 DIAGNOSIS — I890 Lymphedema, not elsewhere classified: Secondary | ICD-10-CM | POA: Insufficient documentation

## 2023-08-19 DIAGNOSIS — Z79899 Other long term (current) drug therapy: Secondary | ICD-10-CM | POA: Insufficient documentation

## 2023-08-19 LAB — CBC WITH DIFFERENTIAL (CANCER CENTER ONLY)
Abs Immature Granulocytes: 0.04 10*3/uL (ref 0.00–0.07)
Basophils Absolute: 0.1 10*3/uL (ref 0.0–0.1)
Basophils Relative: 1 %
Eosinophils Absolute: 0.2 10*3/uL (ref 0.0–0.5)
Eosinophils Relative: 2 %
HCT: 36.5 % (ref 36.0–46.0)
Hemoglobin: 12.4 g/dL (ref 12.0–15.0)
Immature Granulocytes: 0 %
Lymphocytes Relative: 23 %
Lymphs Abs: 2.1 10*3/uL (ref 0.7–4.0)
MCH: 29.6 pg (ref 26.0–34.0)
MCHC: 34 g/dL (ref 30.0–36.0)
MCV: 87.1 fL (ref 80.0–100.0)
Monocytes Absolute: 0.7 10*3/uL (ref 0.1–1.0)
Monocytes Relative: 8 %
Neutro Abs: 5.9 10*3/uL (ref 1.7–7.7)
Neutrophils Relative %: 66 %
Platelet Count: 135 10*3/uL — ABNORMAL LOW (ref 150–400)
RBC: 4.19 MIL/uL (ref 3.87–5.11)
RDW: 15 % (ref 11.5–15.5)
WBC Count: 9 10*3/uL (ref 4.0–10.5)
nRBC: 0 % (ref 0.0–0.2)

## 2023-08-19 LAB — CMP (CANCER CENTER ONLY)
ALT: 16 U/L (ref 0–44)
AST: 19 U/L (ref 15–41)
Albumin: 4 g/dL (ref 3.5–5.0)
Alkaline Phosphatase: 39 U/L (ref 38–126)
Anion gap: 2 — ABNORMAL LOW (ref 5–15)
BUN: 22 mg/dL (ref 8–23)
CO2: 31 mmol/L (ref 22–32)
Calcium: 9.8 mg/dL (ref 8.9–10.3)
Chloride: 101 mmol/L (ref 98–111)
Creatinine: 0.76 mg/dL (ref 0.44–1.00)
GFR, Estimated: >60 mL/min (ref >60)
Glucose, Bld: 108 mg/dL — ABNORMAL HIGH (ref 70–99)
Potassium: 4.1 mmol/L (ref 3.5–5.1)
Sodium: 134 mmol/L — ABNORMAL LOW (ref 135–145)
Total Bilirubin: 0.4 mg/dL (ref 0.0–1.2)
Total Protein: 7 g/dL (ref 6.5–8.1)

## 2023-08-21 ENCOUNTER — Ambulatory Visit: Attending: Sports Medicine | Admitting: Physical Therapy

## 2023-08-21 ENCOUNTER — Encounter: Payer: Self-pay | Admitting: Physical Therapy

## 2023-08-21 DIAGNOSIS — M5459 Other low back pain: Secondary | ICD-10-CM | POA: Diagnosis present

## 2023-08-21 DIAGNOSIS — R252 Cramp and spasm: Secondary | ICD-10-CM | POA: Diagnosis present

## 2023-08-21 DIAGNOSIS — R262 Difficulty in walking, not elsewhere classified: Secondary | ICD-10-CM

## 2023-08-21 DIAGNOSIS — M6281 Muscle weakness (generalized): Secondary | ICD-10-CM | POA: Diagnosis present

## 2023-08-21 DIAGNOSIS — R293 Abnormal posture: Secondary | ICD-10-CM | POA: Diagnosis present

## 2023-08-21 NOTE — Therapy (Signed)
 OUTPATIENT PHYSICAL THERAPY THORACOLUMBAR TREATMENT   Patient Name: [name] MRN: [id number] DOB:February 07, 1934, 89 y.o., female Today's Date: 08/21/2023  END OF SESSION:  PT End of Session - 08/21/23 1503     Visit Number 2    Authorization Type [employer] approved 8 visits 08/06/23-10/01/23 [id number]    Authorization Time Period 08/06/23-10/01/23    Authorization - Visit Number 1    Authorization - Number of Visits 8    Progress Note Due on Visit 10    PT Start Time 1358    PT Stop Time 1447    PT Time Calculation (min) 49 min    Activity Tolerance Patient tolerated treatment well    Behavior During Therapy [facility] for tasks assessed/performed              Past Medical History:  Diagnosis Date   Arthritis    Cancer (HCC)    left breast cancer    Cataracts, bilateral    removed by surgery   CHF (congestive heart failure) (HCC)    PACEMAKER & DEFIB   Complication of anesthesia    hypotensive after back surgery in 2006   Depression    Dyslipidemia    Fainted 04/21/06   AT [facility]   GERD (gastroesophageal reflux disease)    Headache(784.0)    Hearing loss    bilateral hearing aids   HLD (hyperlipidemia)    diet controlled    Hypertension    Hypothyroidism    ICD (implantable cardiac defibrillator) in place    pt has pacer/icd   ICD (implantable cardiac defibrillator), biventricular, in situ    LBBB (left bundle branch block)    Memory loss    Nonischemic cardiomyopathy (HCC)    Normal coronary arteries    s/p cardiac cath 2007   Pacemaker    ICD [employer]   Syncope    Systolic CHF [facility])    Vertigo    Wears glasses    Past Surgical History:  Procedure Laterality Date   BACK SURGERY     lumbar fusion    BIV ICD GENERATOR CHANGEOUT N/A 02/06/2023   Procedure: BIV ICD GENERATOR CHANGEOUT;  Surgeon: Tammie Fall, MD;  Location: [facility] INVASIVE CV LAB;  Service: Cardiovascular;  Laterality: N/A;   BREAST LUMPECTOMY Left 2008   BREAST SURGERY  2000    LUMP REMOVAL. STAGE 1 CANCER   CARDIAC CATHETERIZATION     CATARACT EXTRACTION     COLONOSCOPY     EYE SURGERY     IMPLANTABLE CARDIOVERTER DEFIBRILLATOR GENERATOR CHANGE N/A 12/18/2012   Procedure: IMPLANTABLE CARDIOVERTER DEFIBRILLATOR GENERATOR CHANGE;  Surgeon: Tammie Fall, MD;  Location: [facility] CATH LAB;  Service: Cardiovascular;  Laterality: N/A;   IR INJECT/THERA/INC NEEDLE/CATH/PLC EPI/LUMB/SAC W/IMG  06/28/2023   JOINT REPLACEMENT  06/14/01   right   LUMBAR FUSION  2006   MASS EXCISION  11/08/2011   Procedure: EXCISION MASS;  Surgeon: Lockie Rima, MD;  Location: [facility] ORS;  Service: General;  Laterality: Left;  Excision Left Thigh Mass   MASTECTOMY W/ SENTINEL NODE BIOPSY Left 06/04/2019   Procedure: LEFT MASTECTOMY WITH SENTINEL LYMPH NODE BIOPSY;  Surgeon: Lockie Rima, MD;  Location: [facility] OR;  Service: General;  Laterality: Left;   MASTECTOMY, PARTIAL  2008   GOT PACEMAKER AND DEFIB AT THAT TIME   PACEMAKER INSERTION  04/23/06   TOTAL KNEE ARTHROPLASTY  05/17/01   RIGHT KNEE   TOTAL KNEE ARTHROPLASTY Left 11/29/2014   Procedure: TOTAL LEFT KNEE ARTHROPLASTY;  Surgeon: Claiborne Crew, MD;  Location: [facility] ORS;  Service: Orthopedics;  Laterality: Left;   Patient Active Problem List   Diagnosis Date Noted   Piriformis syndrome of both sides 01/30/2023   Neuropathy 08/01/2022   Atherosclerosis of native artery of both lower extremities with rest pain (HCC) 07/09/2022   Interstitial pulmonary disease (HCC) 07/09/2022   Sciatica associated with disorder of multiple sites of spine 03/20/2021   Bipolar II disorder (HCC) 03/20/2021   Chronic pain syndrome 03/20/2021   Urinary retention 02/04/2020   Seasonal allergies 02/04/2020   Recurrent major depressive disorder, in partial remission (HCC) 07/17/2019   Status post left mastectomy 07/01/2019   Breast cancer of lower-outer quadrant of left female breast (HCC) 06/04/2019   Candida infection, oral 01/15/2019   Senile purpura (HCC) 04/14/2018    Lumbar post-laminectomy syndrome 12/03/2017   Lumbar spondylosis 12/03/2017   History of back surgery 09/01/2017   Constipation 09/01/2017   Hypothyroidism due to acquired atrophy of thyroid 09/01/2017   Mixed hyperlipidemia 09/01/2017   Age-related osteoporosis without current pathological fracture 09/01/2017   High risk medication use 09/01/2017   Arthritis of hand 05/17/2017   Atherosclerosis of native arteries of extremity with intermittent claudication (HCC) 05/15/2017   Status post total bilateral knee replacement 12/20/2016   Gait abnormality 07/16/2016   Chronic low back pain 07/16/2016   Mild cognitive impairment 07/16/2016   Wrist pain 05/10/2015   S/P left TKA 11/29/2014   S/P knee replacement 11/29/2014   Spontaneous bruising 08/27/2014   Numbness and tingling in right hand 07/28/2014   Essential tremor 07/28/2014   Dizziness 05/28/2014   Shaky 05/28/2014   Memory loss 05/28/2014   Depression 05/06/2014   Lipoma of left upper thigh 3x5 cm 09/28/2011   Cerebral vascular accident (HCC) 08/09/2011   Syncope 08/09/2011   History of breast cancer [id number], s/p BCT 2008, triple negative 01/26/2011   Chronic L breast pain with chronic recurrent seroma, s/p excisional biopsy 01/12/2010 01/26/2011   Fainted    ICD (implantable cardioverter-defibrillator), biventricular, in situ 08/02/2010   Chronic systolic heart failure (HCC) 08/02/2010   Essential hypertension 08/02/2010    PCP: [facility], Linnell Richardson, NP  REFERRING PROVIDER: Ulysees Gander, DO  REFERRING DIAG:  Diagnosis  M54.42,M54.41,G89.29 (ICD-10-CM) - Chronic bilateral low back pain with bilateral sciatica  R29.898 (ICD-10-CM) - Right leg weakness  R29.898 (ICD-10-CM) - Left leg weakness  M51.362 (ICD-10-CM) - Degeneration of intervertebral disc of lumbar region with discogenic back pain and lower extremity pain  Z98.1 (ICD-10-CM) - History of lumbar fusion  Z88.8 (ICD-10-CM) - Allergy to iodine    G89.29,M54.41 (ICD-10-CM) - Chronic bilateral low back pain with right-sided sciatic    Rationale for Evaluation and Treatment: Rehabilitation  THERAPY DIAG:  Other low back pain  Muscle weakness (generalized)  Abnormal posture  Difficulty in walking, not elsewhere classified  Cramp and spasm  ONSET DATE: Chronic but recent flare up November 2024  SUBJECTIVE:  SUBJECTIVE STATEMENT: Patient presents with 6/10 back pain. She blames it on the rainy weather outside. Patient's daughter Bartholomew Light is present  From Eval: Patient presents with back pain that travels down both legs but it hurts more her right leg. Leg pain is more recent it began November 2024. When pain travels down legs she has increased muscle spasms and it feels like a squeezing sensation. Her sitting tolerance is limited to 30 minutes to an hour and she is not able to bend to pick up objects. Patient has occasional numbness and tingling in her legs when she is walking. When she sits down the pain takes a while to go away.  Patient's daughter Bartholomew Light is present at eval  PERTINENT HISTORY:  Hx lumbar fusion; Hx Left breast cancer; Left breast lumpectomy; depression, pacemaker; HTN; Vertigo; Hx bilateral TKA; neuropathy   PAIN:  Are you having pain? Yes: NPRS scale: 6(currently) 7-8(worst)/10 Pain location: both sides of lumbar side + bilateral legs Pain description: achy Aggravating factors: Bending; Sitting > 30 min - an hour Relieving factors: Nothing  PRECAUTIONS: None  RED FLAGS: None   WEIGHT BEARING RESTRICTIONS: No  FALLS:  Has patient fallen in last 6 months? No Sometimes patient feels unsteady when she is walking and she would like to address this with physical therapy  LIVING ENVIRONMENT: Lives with: lives with their  daughter Lives in: House/apartment Stairs: No Has following equipment at home: Counselling psychologist, shower chair, Grab bars, and Bedrails  OCCUPATION: Retired  PLOF: Independent with basic ADLs and Independent with household mobility with device  PATIENT GOALS: To get rid of back pain  NEXT MD VISIT: PRN  OBJECTIVE:  Note: Objective measures were completed at Evaluation unless otherwise noted.   PATIENT SURVEYS:  Modified Oswestry 26/50 52%   COGNITION: Overall cognitive status: Within functional limits for tasks assessed     SENSATION: Occasional numbness and tingling in legs  MUSCLE LENGTH: Hamstrings: decreased bilateral    POSTURE: rounded shoulders, forward head, and increased thoracic kyphosis    LUMBAR ROM: all motions limited and painful.    LOWER EXTREMITY ROM:   PROM WFL; pain with Rt hip abduction PROM    LOWER EXTREMITY MMT:  * pain in back  MMT Right eval Left eval  Hip flexion 4-* 4-  Hip extension    Hip abduction 4- 4-  Hip adduction 4- 4-  Hip internal rotation    Hip external rotation    Knee flexion 4-* 4-  Knee extension 4- 4-  Ankle dorsiflexion    Ankle plantarflexion    Ankle inversion    Ankle eversion     (Blank rows = not tested)    FUNCTIONAL TESTS:  5 times sit to stand: 21.54 sec attempted no UE first stand but patient fell back. Used UE support for remaining stands Timed up and go (TUG): 18.18 sec with SBQC  GAIT: Assistive device utilized: Quad cane small base Level of assistance: SBA Comments: decreased cadence; decreased step length bilateral ; flexed trunk  TREATMENT DATE:  08/21/2023 Seated hamstring stretch 2 x 30 sec bilateral  Seated QL stretch 2x 5 sec hold bilateral  Seated ball press 2 x 10 Sit to hand (hands on legs) x 10 Seated hip abduction with yellow loop 2 x 10 Seated hip adduction ball squeeze 2 x 10 Seated piriformis stretch (legs crossed at ankle) 2 x 20 sec bilateral  Seated 3 way  stability ball stretch x 7 each direction 5 sec  hold Seated shoulder ER yellow TB 2 x 10 Discussion on sleeping position NuStep Level 1 5 mins- PT present to discuss status   08/06/2023 Initial Evaluation & HEP created                                                                                                                              Educated patient on logrolling technique. When patient complete supine to sit transfer she verbalized some dizziness. After sitting for two minutes she felt good enough to stand. Upon standing she felt dizzy and unsteady. Provided patient water  and took two BP readings: 9:34 am 152/86  HR 64   9:42am   158/88 HR 65 This is not patient's normal BP readings. She ate a good breakfast this morning. Educated patient on how anxiety and dehydration can impact how she feels and BP readings. Patient felts better after sitting for 5 minutes. Patient left clinic sitting in wheelchair at PT's required due to changes in her gait pattern.    PATIENT EDUCATION:  Education details: POC; hydration; logroll technique Person educated: Patient and Child(ren) Education method: Explanation, Demonstration, and Handouts Education comprehension: verbalized understanding, returned demonstration, and needs further education  HOME EXERCISE PROGRAM: Access Code: [email or web address] URL: [email or web address] Date: 08/21/2023 Prepared by: Penelope Bowie  Exercises - Seated Hamstring Stretch  - 1 x daily - 7 x weekly - 2 sets - 20-30 hold - Seated Quadratus Lumborum Stretch in Chair  - 1 x daily - 7 x weekly - 2 sets - 5-10 hold - Seated Abdominal Press into [employer]  - 1 x daily - 7 x weekly - 2 sets - 10 reps - 3-4 hold - Sit to Stand with Armchair  - 1 x daily - 7 x weekly - 1 sets - 10 reps - Seated Piriformis Stretch  - 1 x daily - 7 x weekly - 2 sets - 20 hold  ASSESSMENT:  CLINICAL IMPRESSION: Mailan presents to therapy for her first follow up visit since  evaluation. Her back is a little more painful today that she attributes to the rainy weather. She denies having in more episodes and dizziness/ lightheadedness. Created HEP to include hip and lumbar flexibility exercises and general strengthening. Patient tolerated treatment session well and did not verbalize any increased pain or discomfort. She required frequent verbal cues for correct breathing technique while performing exercises. Patient will benefit from skilled PT to address the below impairments and improve overall function.   OBJECTIVE IMPAIRMENTS: Abnormal gait, decreased balance, decreased endurance, decreased mobility, decreased ROM, decreased strength, dizziness, increased muscle spasms, impaired flexibility, impaired sensation, postural dysfunction, and pain.   ACTIVITY LIMITATIONS: lifting, bending, sitting, standing, stairs, transfers, and locomotion level  PARTICIPATION LIMITATIONS: meal prep, cleaning, interpersonal relationship, shopping, and community activity  PERSONAL FACTORS: Age, Fitness, Time since onset of injury/illness/exacerbation, and 3+ comorbidities: depression; HTN; pacemaker; neuropathy are also affecting patient's functional outcome.   REHAB POTENTIAL: Good  CLINICAL DECISION  MAKING: Evolving/moderate complexity  EVALUATION COMPLEXITY: High   GOALS: Goals reviewed with patient? Yes  SHORT TERM GOALS: Target date: 09/03/2023  Patient will be independent with initial HEP. Baseline:  Goal status: INITIAL  2.  Patient will report > or = to 30% improvement in back pain since starting PT. Baseline:  Goal status: INITIAL  3.  Patient will demonstrate control when performing sit to stand. Baseline:  Goal status: INITIAL   LONG TERM GOALS: Target date: 10/01/2023  Patient will demonstrate independence in advanced HEP. Baseline:  Goal status: INITIAL  2.  Patient will report > or = to 50% improvement in back pain since starting PT. Baseline:  Goal  status: INITIAL  3.  Patient will verbalize and demonstrate self-care strategies to manage pain including tissue mobility practices and change of position. Baseline:  Goal status: INITIAL  4.  Patient will score < or = to 17sec on 5STS to maintain independence with transfers.  Baseline: 21.54 sec Goal status: INITIAL  5.  Patient will score < or = to 15 sec on TUG to decrease risk of falls. Baseline: 18.18 sec Goal status: INITIAL  6.  Patient will score < or = to 20/50 on ODI to decrease self perceived disability. Baseline: 26/50 52% Goal status: INITIAL  PLAN:  PT FREQUENCY: 1x/week  PT DURATION: 8 weeks  PLANNED INTERVENTIONS: 97164- PT Re-evaluation, 97110-Therapeutic exercises, 97530- Therapeutic activity, 97112- Neuromuscular re-education, 97535- Self Care, 41324- Manual therapy, [id number]- Gait training, [phone]- Canalith repositioning, [id number]- Aquatic Therapy, 97016- Vasopneumatic device, [id number]- Ultrasound, [id number]- Traction (mechanical), [id number]- Ionotophoresis 4mg /ml Dexamethasone , Patient/Family education, Balance training, Stair training, Taping, Dry Needling, Joint mobilization, Joint manipulation, Spinal manipulation, Spinal mobilization, Vestibular training, Cryotherapy, and Moist heat.  PLAN FOR NEXT SESSION: assess HEP compliance; hurdles; step taps; hooklying hip strengthening & core exercises  Penelope Bowie, PT 08/21/23 3:08 PM [facility] [address] [place], [place] [address] Phone # [phone] Fax [phone]

## 2023-08-27 ENCOUNTER — Encounter: Payer: Self-pay | Admitting: Physical Therapy

## 2023-08-27 ENCOUNTER — Ambulatory Visit: Admitting: Physical Therapy

## 2023-08-27 DIAGNOSIS — R293 Abnormal posture: Secondary | ICD-10-CM

## 2023-08-27 DIAGNOSIS — M6281 Muscle weakness (generalized): Secondary | ICD-10-CM

## 2023-08-27 DIAGNOSIS — M5459 Other low back pain: Secondary | ICD-10-CM | POA: Diagnosis not present

## 2023-08-27 DIAGNOSIS — R262 Difficulty in walking, not elsewhere classified: Secondary | ICD-10-CM

## 2023-08-27 DIAGNOSIS — R252 Cramp and spasm: Secondary | ICD-10-CM

## 2023-08-27 NOTE — Therapy (Signed)
 OUTPATIENT PHYSICAL THERAPY THORACOLUMBAR TREATMENT   Patient Name: [name] MRN: [id number] DOB:09/05/1933, 89 y.o., female Today's Date: 08/27/2023  END OF SESSION:  PT End of Session - 08/27/23 0944     Visit Number 3    Date for PT Re-Evaluation 10/01/23    Authorization Type [employer] approved 8 visits 08/06/23-10/01/23 [id number]    Authorization Time Period 08/06/23-10/01/23    Authorization - Visit Number 2    Authorization - Number of Visits 8    Progress Note Due on Visit 10    PT Start Time 0844    PT Stop Time 0941    PT Time Calculation (min) 57 min    Activity Tolerance Patient tolerated treatment well    Behavior During Therapy [facility] for tasks assessed/performed               Past Medical History:  Diagnosis Date   Arthritis    Cancer (HCC)    left breast cancer    Cataracts, bilateral    removed by surgery   CHF (congestive heart failure) (HCC)    PACEMAKER & DEFIB   Complication of anesthesia    hypotensive after back surgery in 2006   Depression    Dyslipidemia    Fainted 04/21/06   AT [facility]   GERD (gastroesophageal reflux disease)    Headache(784.0)    Hearing loss    bilateral hearing aids   HLD (hyperlipidemia)    diet controlled    Hypertension    Hypothyroidism    ICD (implantable cardiac defibrillator) in place    pt has pacer/icd   ICD (implantable cardiac defibrillator), biventricular, in situ    LBBB (left bundle branch block)    Memory loss    Nonischemic cardiomyopathy (HCC)    Normal coronary arteries    s/p cardiac cath 2007   Pacemaker    ICD [employer]   Syncope    Systolic CHF [facility])    Vertigo    Wears glasses    Past Surgical History:  Procedure Laterality Date   BACK SURGERY     lumbar fusion    BIV ICD GENERATOR CHANGEOUT N/A 02/06/2023   Procedure: BIV ICD GENERATOR CHANGEOUT;  Surgeon: Tammie Fall, MD;  Location: [facility] INVASIVE CV LAB;  Service: Cardiovascular;  Laterality: N/A;   BREAST  LUMPECTOMY Left 2008   BREAST SURGERY  2000   LUMP REMOVAL. STAGE 1 CANCER   CARDIAC CATHETERIZATION     CATARACT EXTRACTION     COLONOSCOPY     EYE SURGERY     IMPLANTABLE CARDIOVERTER DEFIBRILLATOR GENERATOR CHANGE N/A 12/18/2012   Procedure: IMPLANTABLE CARDIOVERTER DEFIBRILLATOR GENERATOR CHANGE;  Surgeon: Tammie Fall, MD;  Location: [facility] CATH LAB;  Service: Cardiovascular;  Laterality: N/A;   IR INJECT/THERA/INC NEEDLE/CATH/PLC EPI/LUMB/SAC W/IMG  06/28/2023   JOINT REPLACEMENT  06/14/01   right   LUMBAR FUSION  2006   MASS EXCISION  11/08/2011   Procedure: EXCISION MASS;  Surgeon: Lockie Rima, MD;  Location: [facility] ORS;  Service: General;  Laterality: Left;  Excision Left Thigh Mass   MASTECTOMY W/ SENTINEL NODE BIOPSY Left 06/04/2019   Procedure: LEFT MASTECTOMY WITH SENTINEL LYMPH NODE BIOPSY;  Surgeon: Lockie Rima, MD;  Location: [facility] OR;  Service: General;  Laterality: Left;   MASTECTOMY, PARTIAL  2008   GOT PACEMAKER AND DEFIB AT THAT TIME   PACEMAKER INSERTION  04/23/06   TOTAL KNEE ARTHROPLASTY  05/17/01   RIGHT KNEE   TOTAL KNEE ARTHROPLASTY  Left 11/29/2014   Procedure: TOTAL LEFT KNEE ARTHROPLASTY;  Surgeon: Claiborne Crew, MD;  Location: [facility] ORS;  Service: Orthopedics;  Laterality: Left;   Patient Active Problem List   Diagnosis Date Noted   Piriformis syndrome of both sides 01/30/2023   Neuropathy 08/01/2022   Atherosclerosis of native artery of both lower extremities with rest pain (HCC) 07/09/2022   Interstitial pulmonary disease (HCC) 07/09/2022   Sciatica associated with disorder of multiple sites of spine 03/20/2021   Bipolar II disorder (HCC) 03/20/2021   Chronic pain syndrome 03/20/2021   Urinary retention 02/04/2020   Seasonal allergies 02/04/2020   Recurrent major depressive disorder, in partial remission (HCC) 07/17/2019   Status post left mastectomy 07/01/2019   Breast cancer of lower-outer quadrant of left female breast (HCC) 06/04/2019   Candida infection, oral  01/15/2019   Senile purpura (HCC) 04/14/2018   Lumbar post-laminectomy syndrome 12/03/2017   Lumbar spondylosis 12/03/2017   History of back surgery 09/01/2017   Constipation 09/01/2017   Hypothyroidism due to acquired atrophy of thyroid 09/01/2017   Mixed hyperlipidemia 09/01/2017   Age-related osteoporosis without current pathological fracture 09/01/2017   High risk medication use 09/01/2017   Arthritis of hand 05/17/2017   Atherosclerosis of native arteries of extremity with intermittent claudication (HCC) 05/15/2017   Status post total bilateral knee replacement 12/20/2016   Gait abnormality 07/16/2016   Chronic low back pain 07/16/2016   Mild cognitive impairment 07/16/2016   Wrist pain 05/10/2015   S/P left TKA 11/29/2014   S/P knee replacement 11/29/2014   Spontaneous bruising 08/27/2014   Numbness and tingling in right hand 07/28/2014   Essential tremor 07/28/2014   Dizziness 05/28/2014   Shaky 05/28/2014   Memory loss 05/28/2014   Depression 05/06/2014   Lipoma of left upper thigh 3x5 cm 09/28/2011   Cerebral vascular accident (HCC) 08/09/2011   Syncope 08/09/2011   History of breast cancer [id number], s/p BCT 2008, triple negative 01/26/2011   Chronic L breast pain with chronic recurrent seroma, s/p excisional biopsy 01/12/2010 01/26/2011   Fainted    ICD (implantable cardioverter-defibrillator), biventricular, in situ 08/02/2010   Chronic systolic heart failure (HCC) 08/02/2010   Essential hypertension 08/02/2010    PCP: [facility], Linnell Richardson, NP  REFERRING PROVIDER: Ulysees Gander, DO  REFERRING DIAG:  Diagnosis  M54.42,M54.41,G89.29 (ICD-10-CM) - Chronic bilateral low back pain with bilateral sciatica  R29.898 (ICD-10-CM) - Right leg weakness  R29.898 (ICD-10-CM) - Left leg weakness  M51.362 (ICD-10-CM) - Degeneration of intervertebral disc of lumbar region with discogenic back pain and lower extremity pain  Z98.1 (ICD-10-CM) - History of lumbar fusion  Z88.8  (ICD-10-CM) - Allergy to iodine   G89.29,M54.41 (ICD-10-CM) - Chronic bilateral low back pain with right-sided sciatic    Rationale for Evaluation and Treatment: Rehabilitation  THERAPY DIAG:  Other low back pain  Muscle weakness (generalized)  Abnormal posture  Difficulty in walking, not elsewhere classified  Cramp and spasm  ONSET DATE: Chronic but recent flare up November 2024  SUBJECTIVE:  SUBJECTIVE STATEMENT: Patient reports she is doing okay today. She has been compliant with HEP. Pain is currently 6/10.  From Eval: Patient presents with back pain that travels down both legs but it hurts more her right leg. Leg pain is more recent it began November 2024. When pain travels down legs she has increased muscle spasms and it feels like a squeezing sensation. Her sitting tolerance is limited to 30 minutes to an hour and she is not able to bend to pick up objects. Patient has occasional numbness and tingling in her legs when she is walking. When she sits down the pain takes a while to go away.  Patient's daughter Bartholomew Light is present at eval  PERTINENT HISTORY:  Hx lumbar fusion; Hx Left breast cancer; Left breast lumpectomy; depression, pacemaker; HTN; Vertigo; Hx bilateral TKA; neuropathy   PAIN:  Are you having pain? Yes: NPRS scale: 6(currently) 7-8(worst)/10 Pain location: both sides of lumbar side + bilateral legs Pain description: achy Aggravating factors: Bending; Sitting > 30 min - an hour Relieving factors: Nothing  PRECAUTIONS: None  RED FLAGS: None   WEIGHT BEARING RESTRICTIONS: No  FALLS:  Has patient fallen in last 6 months? No Sometimes patient feels unsteady when she is walking and she would like to address this with physical therapy  LIVING ENVIRONMENT: Lives with: lives  with their daughter Lives in: House/apartment Stairs: No Has following equipment at home: Counselling psychologist, shower chair, Grab bars, and Bedrails  OCCUPATION: Retired  PLOF: Independent with basic ADLs and Independent with household mobility with device  PATIENT GOALS: To get rid of back pain  NEXT MD VISIT: PRN  OBJECTIVE:  Note: Objective measures were completed at Evaluation unless otherwise noted.   PATIENT SURVEYS:  Modified Oswestry 26/50 52%   COGNITION: Overall cognitive status: Within functional limits for tasks assessed     SENSATION: Occasional numbness and tingling in legs  MUSCLE LENGTH: Hamstrings: decreased bilateral    POSTURE: rounded shoulders, forward head, and increased thoracic kyphosis    LUMBAR ROM: all motions limited and painful.    LOWER EXTREMITY ROM:   PROM WFL; pain with Rt hip abduction PROM    LOWER EXTREMITY MMT:  * pain in back  MMT Right eval Left eval  Hip flexion 4-* 4-  Hip extension    Hip abduction 4- 4-  Hip adduction 4- 4-  Hip internal rotation    Hip external rotation    Knee flexion 4-* 4-  Knee extension 4- 4-  Ankle dorsiflexion    Ankle plantarflexion    Ankle inversion    Ankle eversion     (Blank rows = not tested)    FUNCTIONAL TESTS:  5 times sit to stand: 21.54 sec attempted no UE first stand but patient fell back. Used UE support for remaining stands Timed up and go (TUG): 18.18 sec with SBQC  GAIT: Assistive device utilized: Quad cane small base Level of assistance: SBA Comments: decreased cadence; decreased step length bilateral ; flexed trunk  TREATMENT DATE:  08/27/2023 NuStep Level 3 5 mins- PT present to discuss status Seated hamstring stretch 2 x 30 sec bilateral  Seated 3 way stability ball stretch x 8 each direction 5 sec hold Hooklying LTR x 8 each direction 5 sec hold Hooklying TA activation x 20 (verbal and tactile cues) Hooklying hip abduction with yellow loop 2 x  10 Hooklying hip adduction with blue ball 2 x 10 Hooklying alt hand and knee press x 10  each side Seated right hip adduction stretch 2 x 20 sec Seated ball press 2 x 10 Seated shoulder ER yellow TB  x 10 6 inch step taps x 20 Hurdles (forwards/ sideways) x 3 laps Manual: Addaday to lumbar paraspinals for improved blood flow and muscle elongation. PT present to monitor status.    08/21/2023 Seated hamstring stretch 2 x 30 sec bilateral  Seated QL stretch 2x 5 sec hold bilateral  Seated ball press 2 x 10 Sit to hand (hands on legs) x 10 Seated hip abduction with yellow loop 2 x 10 Seated hip adduction ball squeeze 2 x 10 Seated piriformis stretch (legs crossed at ankle) 2 x 20 sec bilateral  Seated 3 way stability ball stretch x 7 each direction 5 sec hold Seated shoulder ER yellow TB 2 x 10 Discussion on sleeping position NuStep Level 1 5 mins- PT present to discuss status   08/06/2023 Initial Evaluation & HEP created                                                                                                                              Educated patient on logrolling technique. When patient complete supine to sit transfer she verbalized some dizziness. After sitting for two minutes she felt good enough to stand. Upon standing she felt dizzy and unsteady. Provided patient water  and took two BP readings: 9:34 am 152/86  HR 64   9:42am   158/88 HR 65 This is not patient's normal BP readings. She ate a good breakfast this morning. Educated patient on how anxiety and dehydration can impact how she feels and BP readings. Patient felts better after sitting for 5 minutes. Patient left clinic sitting in wheelchair at PT's required due to changes in her gait pattern.    PATIENT EDUCATION:  Education details: POC; hydration; logroll technique Person educated: Patient and Child(ren) Education method: Explanation, Demonstration, and Handouts Education comprehension: verbalized  understanding, returned demonstration, and needs further education  HOME EXERCISE PROGRAM: Access Code: [email or web address] URL: [email or web address] Date: 08/27/2023 Prepared by: Penelope Bowie  Exercises - Seated Hamstring Stretch  - 1 x daily - 7 x weekly - 2 sets - 20-30 hold - Seated Quadratus Lumborum Stretch in Chair  - 1 x daily - 7 x weekly - 2 sets - 5-10 hold - Seated Abdominal Press into [employer]  - 1 x daily - 7 x weekly - 2 sets - 10 reps - 3-4 hold - Sit to Stand with Armchair  - 1 x daily - 7 x weekly - 1 sets - 10 reps - Seated Piriformis Stretch  - 1 x daily - 7 x weekly - 2 sets - 20 hold - Supine Lower Trunk Rotation  - 1 x daily - 7 x weekly - 1 sets - 8-10 reps - 5 hold - Supine Transversus Abdominis Bracing - Hands on Stomach  - 1 x daily - 7 x weekly - 2 sets - 10  reps - 3-4 hold - Seated Hip Adductor Stretch  - 1 x daily - 7 x weekly - 2 sets - 20 hold  ASSESSMENT:  CLINICAL IMPRESSION: [name] presents to therapy with 6/10 back pain. She has been compliant with HEP and verbalized relief when performing seated piriformis stretch. Incorporated TA activation today and patient required verbal and tactile cues for correct exercise performance. Updated HEP to include exercise progressions. Overall, patient tolerated treatment session well. She verbalized pain relief with manual techniques. Patient will benefit from skilled PT to address the below impairments and improve overall function.    OBJECTIVE IMPAIRMENTS: Abnormal gait, decreased balance, decreased endurance, decreased mobility, decreased ROM, decreased strength, dizziness, increased muscle spasms, impaired flexibility, impaired sensation, postural dysfunction, and pain.   ACTIVITY LIMITATIONS: lifting, bending, sitting, standing, stairs, transfers, and locomotion level  PARTICIPATION LIMITATIONS: meal prep, cleaning, interpersonal relationship, shopping, and community activity  PERSONAL FACTORS: Age,  Fitness, Time since onset of injury/illness/exacerbation, and 3+ comorbidities: depression; HTN; pacemaker; neuropathy are also affecting patient's functional outcome.   REHAB POTENTIAL: Good  CLINICAL DECISION MAKING: Evolving/moderate complexity  EVALUATION COMPLEXITY: High   GOALS: Goals reviewed with patient? Yes  SHORT TERM GOALS: Target date: 09/03/2023  Patient will be independent with initial HEP. Baseline:  Goal status: INITIAL  2.  Patient will report > or = to 30% improvement in back pain since starting PT. Baseline:  Goal status: INITIAL  3.  Patient will demonstrate control when performing sit to stand. Baseline:  Goal status: INITIAL   LONG TERM GOALS: Target date: 10/01/2023  Patient will demonstrate independence in advanced HEP. Baseline:  Goal status: INITIAL  2.  Patient will report > or = to 50% improvement in back pain since starting PT. Baseline:  Goal status: INITIAL  3.  Patient will verbalize and demonstrate self-care strategies to manage pain including tissue mobility practices and change of position. Baseline:  Goal status: INITIAL  4.  Patient will score < or = to 17sec on 5STS to maintain independence with transfers.  Baseline: 21.54 sec Goal status: INITIAL  5.  Patient will score < or = to 15 sec on TUG to decrease risk of falls. Baseline: 18.18 sec Goal status: INITIAL  6.  Patient will score < or = to 20/50 on ODI to decrease self perceived disability. Baseline: 26/50 52% Goal status: INITIAL  PLAN:  PT FREQUENCY: 1x/week  PT DURATION: 8 weeks  PLANNED INTERVENTIONS: 97164- PT Re-evaluation, 97110-Therapeutic exercises, 97530- Therapeutic activity, [id number]- Neuromuscular re-education, 97535- Self Care, [id number]- Manual therapy, [id number]- Gait training, [phone]- Canalith repositioning, [id number]- Aquatic Therapy, 97016- Vasopneumatic device, [id number]- Ultrasound, [id number]- Traction (mechanical), [id number]- Ionotophoresis 4mg /ml Dexamethasone ,  Patient/Family education, Balance training, Stair training, Taping, Dry Needling, Joint mobilization, Joint manipulation, Spinal manipulation, Spinal mobilization, Vestibular training, Cryotherapy, and Moist heat.  PLAN FOR NEXT SESSION: assess tolerance to treatment session; continue core strengthening; balance (airex, step ups)  Penelope Bowie, PT 08/27/23 9:45 AM [facility] [address], [address] [place], [place] [address] Phone # [phone] Fax [phone]

## 2023-08-28 ENCOUNTER — Encounter: Payer: Self-pay | Admitting: Physical Medicine and Rehabilitation

## 2023-08-28 ENCOUNTER — Encounter
Payer: Medicare Other | Attending: Physical Medicine and Rehabilitation | Admitting: Physical Medicine and Rehabilitation

## 2023-08-28 VITALS — BP 125/76 | HR 68 | Ht <= 58 in | Wt 119.2 lb

## 2023-08-28 DIAGNOSIS — M48062 Spinal stenosis, lumbar region with neurogenic claudication: Secondary | ICD-10-CM | POA: Diagnosis present

## 2023-08-28 DIAGNOSIS — M961 Postlaminectomy syndrome, not elsewhere classified: Secondary | ICD-10-CM

## 2023-08-28 DIAGNOSIS — G894 Chronic pain syndrome: Secondary | ICD-10-CM | POA: Diagnosis not present

## 2023-08-28 DIAGNOSIS — M4800 Spinal stenosis, site unspecified: Secondary | ICD-10-CM

## 2023-08-28 MED ORDER — PREGABALIN 25 MG PO CAPS: 25.0000 mg | ORAL_CAPSULE | Freq: Two times a day (BID) | ORAL | 5 refills | Status: DC

## 2023-08-28 MED ORDER — METAXALONE 800 MG PO TABS: 400.0000 mg | ORAL_TABLET | Freq: Three times a day (TID) | ORAL | 5 refills | Status: DC | PRN

## 2023-08-28 NOTE — Progress Notes (Signed)
 Subjective:    Patient ID: [name], female    DOB: 1933-10-19, 89 y.o.   MRN: [id number]  HPI  Pt is an 89 yr old female with hx of breast CA and RUE lymphedema, as well as uterine prolapse, NICCM- as well as Chronic low back pain with B/L sciatica- also has memory issues on Aricept ; hx of lumbar fusion at L4/5. Has a hx of Bipolar Type II depression. Is on Zoloft  200 mg daily, Trilpetal and Wellbutrin  for mood.  Here for f/u on Lumbar radiculopathy    Scarring in lungs, not progressing.    [facility]-  Dr Cleora Daft sent her.  Sent du eot severe stenosis at L2/3 and L3/4  Has ESI didn't work at all.  Went back to Dr Cleora Daft for f/u- so needs to see [facility] to see if any other options   Going to PT 2 sessions so far.  Hasn't gotten much better yet.   Doing HEP daily in between exercises.   If doesn't do them, hurts worse.   One of stretches for piriformis- has helped to reduce pain around buttocks area.   Pain has gotten worse since saw me last-  But rating 6/10 just like last time.   Less in low back and now mor ein buttocks or legs and has pain just sitting.  Sitting on exam table is hurting- radiates pain down into legs B/L- down to toes B/L and equal amount.   Sometimes R side is much worse, but not right now.   Surgery probably not possible due to age- she also doesn't want it.    Takes Skelaxin  2x/day-makes her so sleepy.  More pain when skips/day per my request.      Pain Inventory Average Pain 6 Pain Right Now 6 My pain is dull and aching  In the last 24 hours, has pain interfered with the following? General activity 3 Relation with others 0 Enjoyment of life 6 What TIME of day is your pain at its worst? morning  Sleep (in general) Fair  Pain is worse with: bending, sitting, unsure, some activites, and Lifting Pain improves with: rest, pacing activities, and medication Relief from Meds: 5  Family History   Problem Relation Age of Onset   Hypertension Mother    Arthritis Mother    Hypertension Father    Hypertension Brother    Hypertension Brother    Social History   Socioeconomic History   Marital status: Married    Spouse name: Not on file   Number of children: 1   Years of education: Masters   Highest education level: Master's degree (e.g., MA, MS, MEng, MEd, MSW, MBA)  Occupational History   Occupation: Retired  Tobacco Use   Smoking status: Never    Passive exposure: Never   Smokeless tobacco: Never  Vaping Use   Vaping status: Never Used  Substance and Sexual Activity   Alcohol use: No   Drug use: No   Sexual activity: Not Currently    Birth control/protection: Post-menopausal  Other Topics Concern   Not on file  Social History Narrative   Lives at home with husband.   Right-handed.      As of 07/28/2014   Diet: No special diet   Caffeine: yes, Chocolate, tea and sodas    Married: YES, 1970   House: Yes, 2 stories, 2-3 persons live in home   Pets: No   Current/Past profession: Nurse, mental health, Nurse  Practitioner    Exercise: Yes 2-3 x weekly   Living Will: Yes   DNR: No   POA/HPOA: No      Social Drivers of Corporate investment banker Strain: Low Risk  (06/06/2023)   Overall Financial Resource Strain (CARDIA)    Difficulty of Paying Living Expenses: Not hard at all  Food Insecurity: No Food Insecurity (06/06/2023)   Hunger Vital Sign    Worried About Running Out of Food in the Last Year: Never true    Ran Out of Food in the Last Year: Never true  Transportation Needs: No Transportation Needs (06/06/2023)   PRAPARE - Administrator, Civil Service (Medical): No    Lack of Transportation (Non-Medical): No  Physical Activity: Unknown (06/06/2023)   Exercise Vital Sign    Days of Exercise per Week: Patient declined    Minutes of Exercise per Session: Not on file  Stress: Patient Declined (06/06/2023)   [employer] of Occupational Health -  Occupational Stress Questionnaire    Feeling of Stress : Patient declined  Social Connections: Moderately Integrated (06/06/2023)   Social Connection and Isolation Panel [NHANES]    Frequency of Communication with Friends and Family: Three times a week    Frequency of Social Gatherings with Friends and Family: Patient declined    Attends Religious Services: More than 4 times per year    Active Member of [employer] or Organizations: Yes    Attends Banker Meetings: More than 4 times per year    Marital Status: Widowed   Past Surgical History:  Procedure Laterality Date   BACK SURGERY     lumbar fusion    BIV ICD GENERATOR CHANGEOUT N/A 02/06/2023   Procedure: BIV ICD GENERATOR CHANGEOUT;  Surgeon: Tammie Fall, MD;  Location: [facility] INVASIVE CV LAB;  Service: Cardiovascular;  Laterality: N/A;   BREAST LUMPECTOMY Left 2008   BREAST SURGERY  2000   LUMP REMOVAL. STAGE 1 CANCER   CARDIAC CATHETERIZATION     CATARACT EXTRACTION     COLONOSCOPY     EYE SURGERY     IMPLANTABLE CARDIOVERTER DEFIBRILLATOR GENERATOR CHANGE N/A 12/18/2012   Procedure: IMPLANTABLE CARDIOVERTER DEFIBRILLATOR GENERATOR CHANGE;  Surgeon: Tammie Fall, MD;  Location: [facility] CATH LAB;  Service: Cardiovascular;  Laterality: N/A;   IR INJECT/THERA/INC NEEDLE/CATH/PLC EPI/LUMB/SAC W/IMG  06/28/2023   JOINT REPLACEMENT  06/14/01   right   LUMBAR FUSION  2006   MASS EXCISION  11/08/2011   Procedure: EXCISION MASS;  Surgeon: Lockie Rima, MD;  Location: [facility] ORS;  Service: General;  Laterality: Left;  Excision Left Thigh Mass   MASTECTOMY W/ SENTINEL NODE BIOPSY Left 06/04/2019   Procedure: LEFT MASTECTOMY WITH SENTINEL LYMPH NODE BIOPSY;  Surgeon: Lockie Rima, MD;  Location: [facility] OR;  Service: General;  Laterality: Left;   MASTECTOMY, PARTIAL  2008   GOT PACEMAKER AND DEFIB AT THAT TIME   PACEMAKER INSERTION  04/23/06   TOTAL KNEE ARTHROPLASTY  05/17/01   RIGHT KNEE   TOTAL KNEE ARTHROPLASTY Left 11/29/2014   Procedure:  TOTAL LEFT KNEE ARTHROPLASTY;  Surgeon: Claiborne Crew, MD;  Location: [facility] ORS;  Service: Orthopedics;  Laterality: Left;   Past Surgical History:  Procedure Laterality Date   BACK SURGERY     lumbar fusion    BIV ICD GENERATOR CHANGEOUT N/A 02/06/2023   Procedure: BIV ICD GENERATOR CHANGEOUT;  Surgeon: Tammie Fall, MD;  Location: [facility] INVASIVE CV LAB;  Service: Cardiovascular;  Laterality: N/A;  BREAST LUMPECTOMY Left 2008   BREAST SURGERY  2000   LUMP REMOVAL. STAGE 1 CANCER   CARDIAC CATHETERIZATION     CATARACT EXTRACTION     COLONOSCOPY     EYE SURGERY     IMPLANTABLE CARDIOVERTER DEFIBRILLATOR GENERATOR CHANGE N/A 12/18/2012   Procedure: IMPLANTABLE CARDIOVERTER DEFIBRILLATOR GENERATOR CHANGE;  Surgeon: Tammie Fall, MD;  Location: [facility] CATH LAB;  Service: Cardiovascular;  Laterality: N/A;   IR INJECT/THERA/INC NEEDLE/CATH/PLC EPI/LUMB/SAC W/IMG  06/28/2023   JOINT REPLACEMENT  06/14/01   right   LUMBAR FUSION  2006   MASS EXCISION  11/08/2011   Procedure: EXCISION MASS;  Surgeon: Lockie Rima, MD;  Location: [facility] ORS;  Service: General;  Laterality: Left;  Excision Left Thigh Mass   MASTECTOMY W/ SENTINEL NODE BIOPSY Left 06/04/2019   Procedure: LEFT MASTECTOMY WITH SENTINEL LYMPH NODE BIOPSY;  Surgeon: Lockie Rima, MD;  Location: [facility] OR;  Service: General;  Laterality: Left;   MASTECTOMY, PARTIAL  2008   GOT PACEMAKER AND DEFIB AT THAT TIME   PACEMAKER INSERTION  04/23/06   TOTAL KNEE ARTHROPLASTY  05/17/01   RIGHT KNEE   TOTAL KNEE ARTHROPLASTY Left 11/29/2014   Procedure: TOTAL LEFT KNEE ARTHROPLASTY;  Surgeon: Claiborne Crew, MD;  Location: [facility] ORS;  Service: Orthopedics;  Laterality: Left;   Past Medical History:  Diagnosis Date   Arthritis    Cancer (HCC)    left breast cancer    Cataracts, bilateral    removed by surgery   CHF (congestive heart failure) (HCC)    PACEMAKER & DEFIB   Complication of anesthesia    hypotensive after back surgery in 2006   Depression     Dyslipidemia    Fainted 04/21/06   AT [facility]   GERD (gastroesophageal reflux disease)    Headache(784.0)    Hearing loss    bilateral hearing aids   HLD (hyperlipidemia)    diet controlled    Hypertension    Hypothyroidism    ICD (implantable cardiac defibrillator) in place    pt has pacer/icd   ICD (implantable cardiac defibrillator), biventricular, in situ    LBBB (left bundle branch block)    Memory loss    Nonischemic cardiomyopathy (HCC)    Normal coronary arteries    s/p cardiac cath 2007   Pacemaker    ICD [employer]   Syncope    Systolic CHF [facility])    Vertigo    Wears glasses    BP 125/76 (Patient Position: Sitting, Cuff Size: Normal)   Pulse 68   Ht 4\' 10"  (1.473 m)   Wt 119 lb 3.2 oz (54.1 kg)   LMP  (LMP Unknown)   SpO2 96%   BMI 24.91 kg/m   Opioid Risk Score:   Fall Risk Score:  `1  Depression screen [facility] 2/9     08/28/2023   11:57 AM 05/27/2023   10:43 AM 05/01/2023   11:31 AM 01/30/2023   10:38 AM 08/01/2022   10:51 AM 07/09/2022   10:41 AM 05/18/2022    3:02 PM  Depression screen PH[facility] 2/9  Decreased Interest 0 0 1 1 0 0 0  Down, Depressed, Hopeless 0 0 1 1 0 0 0  PHQ - 2 Score 0 0 2 2 0 0 0      Review of Systems  Musculoskeletal:  Positive for back pain.       Lower back and radiates down into legs  Psychiatric/Behavioral:  Depression (patient on treatment plan for depression)  All other systems reviewed and are negative.      Objective:   Physical Exam  Awake, alert, appropriate, delayed responses, but mild; accompanied by daughter, using mini quad cane- with attachment to bottom to make quad cane, NAD TTP over lower lumbar spine- worse with extension and rotation esp Also TTP with piriformis B/L  MSK: HF 4+/5; otherwise 5-/5 B/L   SLR (+) B/L    Assessment & Plan:  Pt is an 89 yr old female with hx of breast CA and RUE lymphedema, as well as uterine prolapse, NICCM- as well as Chronic low back pain with B/L sciatica-  also has memory issues on Aricept ; hx of lumbar fusion at L4/5. Has a hx of Bipolar Type II depression. Is on Zoloft  200 mg daily, Trilpetal and Wellbutrin  for mood.  Here for f/u on Lumbar radiculopathy. Stenosis and piriformis syndrome.    Have to do home exercise program daily- not 5 days/week-   2. Educated pt why ESI probably didn't work- since has severe canal stenosis- also has 2 other disc bulges- which could be why pain worse on R. Educated on disc bulges and why only has 1 that's significant.     3.  Needs to wait to see Neurosurgery to see the  plan. Got ESI at [facility].  Has appt with Dr Michale Age.    4.  So suggest writing down, in bullet fashion- to describe her issues for Dr Michale Age, so he can read and see it summarized, faster, so he has more time for you.   5.   Con't Skelaxin  400 mg BID prn/ and Lyrica - 25 mg BID for pain- sent in refills   6. Con't low dose Naltrexone - 5 mg nightly- for nerve pain   7. Let me know what Dr Michale Age says-  make it clear she's avery poor responder to meds due to side effects. Bladder issues could be from back  8. No results with tennis balls for piriformis syndrome   9. Has difficulty with feeling worthless.    10 The back can CAUSE bladder problems   I spent a total of 30   minutes on total care today- >50% coordination of care- due to d/w pt and daughter about plan- and what we've tried and hasn't worked. And aslo discussed bladder issues.

## 2023-08-28 NOTE — Patient Instructions (Addendum)
 Pt is an 89 yr old female with hx of breast CA and RUE lymphedema, as well as uterine prolapse, NICCM- as well as Chronic low back pain with B/L sciatica- also has memory issues on Aricept ; hx of lumbar fusion at L4/5. Has a hx of Bipolar Type II depression. Is on Zoloft  200 mg daily, Trilpetal and Wellbutrin  for mood.  Here for f/u on Lumbar radiculopathy   Have to do home exercise program daily- not 5 days/week-   2. Educated pt why ESI probably didn't work- since has severe canal stenosis- also has 2 other disc bulges- which could be why pain worse on R. Educated on disc bulges and why only has 1 that's significant.     3.  Needs to wait to see Neurosurgery to see the  plan. Got ESI at [facility].  Has appt with Dr Michale Age.    4.  So suggest writing down, in bullet fashion- to describe her issues for Dr Michale Age, so he can read and see it summarized, faster, so he has more time for you.   5.   Con't Skelaxin  400 mg BID prn/ and Lyrica - 25 mg BID for pain- sent in refills   6. Con't low dose Naltrexone - 5 mg nightly- for nerve pain   7. Let me know what Dr Michale Age says-  make it clear she's avery poor responder to meds due to side effects. Also having bladder issues- so could be from back.   8. No results with tennis balls for piriformis syndrome   9. Has difficulty with feeling worthless.  We discussed this at length.    10 The back can CAUSE bladder problems- so think about that.

## 2023-09-03 ENCOUNTER — Encounter: Payer: Self-pay | Admitting: Physical Medicine and Rehabilitation

## 2023-09-03 DIAGNOSIS — M5416 Radiculopathy, lumbar region: Secondary | ICD-10-CM

## 2023-09-03 DIAGNOSIS — M48062 Spinal stenosis, lumbar region with neurogenic claudication: Secondary | ICD-10-CM

## 2023-09-04 ENCOUNTER — Encounter: Payer: Self-pay | Admitting: Physical Therapy

## 2023-09-04 ENCOUNTER — Ambulatory Visit: Admitting: Physical Therapy

## 2023-09-04 DIAGNOSIS — M6281 Muscle weakness (generalized): Secondary | ICD-10-CM

## 2023-09-04 DIAGNOSIS — R252 Cramp and spasm: Secondary | ICD-10-CM

## 2023-09-04 DIAGNOSIS — R262 Difficulty in walking, not elsewhere classified: Secondary | ICD-10-CM

## 2023-09-04 DIAGNOSIS — R293 Abnormal posture: Secondary | ICD-10-CM

## 2023-09-04 DIAGNOSIS — M5459 Other low back pain: Secondary | ICD-10-CM | POA: Diagnosis not present

## 2023-09-04 NOTE — Therapy (Signed)
 OUTPATIENT PHYSICAL THERAPY THORACOLUMBAR TREATMENT   Patient Name: [name] MRN: [id number] DOB:09/13/1933, 89 y.o., female Today's Date: 09/04/2023  END OF SESSION:  PT End of Session - 09/04/23 1446     Visit Number 4    Date for PT Re-Evaluation 10/01/23    Authorization Type [employer] approved 8 visits 08/06/23-10/01/23 [id number]    Authorization Time Period 08/06/23-10/01/23    Authorization - Visit Number 3    Authorization - Number of Visits 8    Progress Note Due on Visit 10    PT Start Time 1400    PT Stop Time 1444    PT Time Calculation (min) 44 min    Activity Tolerance Patient tolerated treatment well    Behavior During Therapy [facility] for tasks assessed/performed                Past Medical History:  Diagnosis Date   Arthritis    Cancer (HCC)    left breast cancer    Cataracts, bilateral    removed by surgery   CHF (congestive heart failure) (HCC)    PACEMAKER & DEFIB   Complication of anesthesia    hypotensive after back surgery in 2006   Depression    Dyslipidemia    Fainted 04/21/06   AT [facility]   GERD (gastroesophageal reflux disease)    Headache(784.0)    Hearing loss    bilateral hearing aids   HLD (hyperlipidemia)    diet controlled    Hypertension    Hypothyroidism    ICD (implantable cardiac defibrillator) in place    pt has pacer/icd   ICD (implantable cardiac defibrillator), biventricular, in situ    LBBB (left bundle branch block)    Memory loss    Nonischemic cardiomyopathy (HCC)    Normal coronary arteries    s/p cardiac cath 2007   Pacemaker    ICD [employer]   Syncope    Systolic CHF [facility])    Vertigo    Wears glasses    Past Surgical History:  Procedure Laterality Date   BACK SURGERY     lumbar fusion    BIV ICD GENERATOR CHANGEOUT N/A 02/06/2023   Procedure: BIV ICD GENERATOR CHANGEOUT;  Surgeon: Tammie Fall, MD;  Location: [facility] INVASIVE CV LAB;  Service: Cardiovascular;  Laterality: N/A;   BREAST  LUMPECTOMY Left 2008   BREAST SURGERY  2000   LUMP REMOVAL. STAGE 1 CANCER   CARDIAC CATHETERIZATION     CATARACT EXTRACTION     COLONOSCOPY     EYE SURGERY     IMPLANTABLE CARDIOVERTER DEFIBRILLATOR GENERATOR CHANGE N/A 12/18/2012   Procedure: IMPLANTABLE CARDIOVERTER DEFIBRILLATOR GENERATOR CHANGE;  Surgeon: Tammie Fall, MD;  Location: [facility] CATH LAB;  Service: Cardiovascular;  Laterality: N/A;   IR INJECT/THERA/INC NEEDLE/CATH/PLC EPI/LUMB/SAC W/IMG  06/28/2023   JOINT REPLACEMENT  06/14/01   right   LUMBAR FUSION  2006   MASS EXCISION  11/08/2011   Procedure: EXCISION MASS;  Surgeon: Lockie Rima, MD;  Location: [facility] ORS;  Service: General;  Laterality: Left;  Excision Left Thigh Mass   MASTECTOMY W/ SENTINEL NODE BIOPSY Left 06/04/2019   Procedure: LEFT MASTECTOMY WITH SENTINEL LYMPH NODE BIOPSY;  Surgeon: Lockie Rima, MD;  Location: [facility] OR;  Service: General;  Laterality: Left;   MASTECTOMY, PARTIAL  2008   GOT PACEMAKER AND DEFIB AT THAT TIME   PACEMAKER INSERTION  04/23/06   TOTAL KNEE ARTHROPLASTY  05/17/01   RIGHT KNEE   TOTAL KNEE  ARTHROPLASTY Left 11/29/2014   Procedure: TOTAL LEFT KNEE ARTHROPLASTY;  Surgeon: Claiborne Crew, MD;  Location: [facility] ORS;  Service: Orthopedics;  Laterality: Left;   Patient Active Problem List   Diagnosis Date Noted   Spinal stenosis of lumbar region with neurogenic claudication 08/28/2023   Nerve pain due to spinal stenosis 08/28/2023   Piriformis syndrome of both sides 01/30/2023   Neuropathy 08/01/2022   Atherosclerosis of native artery of both lower extremities with rest pain (HCC) 07/09/2022   Interstitial pulmonary disease (HCC) 07/09/2022   Sciatica associated with disorder of multiple sites of spine 03/20/2021   Bipolar II disorder (HCC) 03/20/2021   Chronic pain syndrome 03/20/2021   Urinary retention 02/04/2020   Seasonal allergies 02/04/2020   Recurrent major depressive disorder, in partial remission (HCC) 07/17/2019   Status post left  mastectomy 07/01/2019   Breast cancer of lower-outer quadrant of left female breast (HCC) 06/04/2019   Candida infection, oral 01/15/2019   Senile purpura (HCC) 04/14/2018   Lumbar post-laminectomy syndrome 12/03/2017   Lumbar spondylosis 12/03/2017   History of back surgery 09/01/2017   Constipation 09/01/2017   Hypothyroidism due to acquired atrophy of thyroid 09/01/2017   Mixed hyperlipidemia 09/01/2017   Age-related osteoporosis without current pathological fracture 09/01/2017   High risk medication use 09/01/2017   Arthritis of hand 05/17/2017   Atherosclerosis of native arteries of extremity with intermittent claudication (HCC) 05/15/2017   Status post total bilateral knee replacement 12/20/2016   Gait abnormality 07/16/2016   Chronic low back pain 07/16/2016   Mild cognitive impairment 07/16/2016   Wrist pain 05/10/2015   S/P left TKA 11/29/2014   S/P knee replacement 11/29/2014   Spontaneous bruising 08/27/2014   Numbness and tingling in right hand 07/28/2014   Essential tremor 07/28/2014   Dizziness 05/28/2014   Shaky 05/28/2014   Memory loss 05/28/2014   Depression 05/06/2014   Lipoma of left upper thigh 3x5 cm 09/28/2011   Cerebral vascular accident (HCC) 08/09/2011   Syncope 08/09/2011   History of breast cancer [id number], s/p BCT 2008, triple negative 01/26/2011   Chronic L breast pain with chronic recurrent seroma, s/p excisional biopsy 01/12/2010 01/26/2011   Fainted    ICD (implantable cardioverter-defibrillator), biventricular, in situ 08/02/2010   Chronic systolic heart failure (HCC) 08/02/2010   Essential hypertension 08/02/2010    PCP: [facility], Linnell Richardson, NP  REFERRING PROVIDER: Ulysees Gander, DO  REFERRING DIAG:  Diagnosis  M54.42,M54.41,G89.29 (ICD-10-CM) - Chronic bilateral low back pain with bilateral sciatica  R29.898 (ICD-10-CM) - Right leg weakness  R29.898 (ICD-10-CM) - Left leg weakness  M51.362 (ICD-10-CM) - Degeneration of  intervertebral disc of lumbar region with discogenic back pain and lower extremity pain  Z98.1 (ICD-10-CM) - History of lumbar fusion  Z88.8 (ICD-10-CM) - Allergy to iodine   G89.29,M54.41 (ICD-10-CM) - Chronic bilateral low back pain with right-sided sciatic    Rationale for Evaluation and Treatment: Rehabilitation  THERAPY DIAG:  Other low back pain  Muscle weakness (generalized)  Abnormal posture  Difficulty in walking, not elsewhere classified  Cramp and spasm  ONSET DATE: Chronic but recent flare up November 2024  SUBJECTIVE:  SUBJECTIVE STATEMENT: Patient reports she is doing okay today. Pain is 6/10. She felt good after last treatment session.  From Eval: Patient presents with back pain that travels down both legs but it hurts more her right leg. Leg pain is more recent it began November 2024. When pain travels down legs she has increased muscle spasms and it feels like a squeezing sensation. Her sitting tolerance is limited to 30 minutes to an hour and she is not able to bend to pick up objects. Patient has occasional numbness and tingling in her legs when she is walking. When she sits down the pain takes a while to go away.  Patient's daughter Bartholomew Light is present at eval  PERTINENT HISTORY:  Hx lumbar fusion; Hx Left breast cancer; Left breast lumpectomy; depression, pacemaker; HTN; Vertigo; Hx bilateral TKA; neuropathy   PAIN:  Are you having pain? Yes: NPRS scale: 6(currently) 7-8(worst)/10 Pain location: both sides of lumbar side + bilateral legs Pain description: achy Aggravating factors: Bending; Sitting > 30 min - an hour Relieving factors: Nothing  PRECAUTIONS: None  RED FLAGS: None   WEIGHT BEARING RESTRICTIONS: No  FALLS:  Has patient fallen in last 6 months? No Sometimes  patient feels unsteady when she is walking and she would like to address this with physical therapy  LIVING ENVIRONMENT: Lives with: lives with their daughter Lives in: House/apartment Stairs: No Has following equipment at home: Counselling psychologist, shower chair, Grab bars, and Bedrails  OCCUPATION: Retired  PLOF: Independent with basic ADLs and Independent with household mobility with device  PATIENT GOALS: To get rid of back pain  NEXT MD VISIT: PRN  OBJECTIVE:  Note: Objective measures were completed at Evaluation unless otherwise noted.   PATIENT SURVEYS:  Modified Oswestry 26/50 52%   COGNITION: Overall cognitive status: Within functional limits for tasks assessed     SENSATION: Occasional numbness and tingling in legs  MUSCLE LENGTH: Hamstrings: decreased bilateral    POSTURE: rounded shoulders, forward head, and increased thoracic kyphosis    LUMBAR ROM: all motions limited and painful.    LOWER EXTREMITY ROM:   PROM WFL; pain with Rt hip abduction PROM    LOWER EXTREMITY MMT:  * pain in back  MMT Right eval Left eval  Hip flexion 4-* 4-  Hip extension    Hip abduction 4- 4-  Hip adduction 4- 4-  Hip internal rotation    Hip external rotation    Knee flexion 4-* 4-  Knee extension 4- 4-  Ankle dorsiflexion    Ankle plantarflexion    Ankle inversion    Ankle eversion     (Blank rows = not tested)    FUNCTIONAL TESTS:  5 times sit to stand: 21.54 sec attempted no UE first stand but patient fell back. Used UE support for remaining stands Timed up and go (TUG): 18.18 sec with SBQC  GAIT: Assistive device utilized: Quad cane small base Level of assistance: SBA Comments: decreased cadence; decreased step length bilateral ; flexed trunk  TREATMENT DATE:  09/04/2023 NuStep Level 2 5 mins- PT present to discuss status Airex Step ups x 10 bilateral (no UE support for last few repetitions)  Weight shifting on airex (side to side; staggered) x  1 min each direction Seated 3 way stability ball stretch x 8 each direction 5 sec hold Hooklying TA activation x 10  Hooklying TA activation+ ball squeeze 2 x 10 Hooklying alt hand and knee press with beach ball in between x  10 each TA activation + SLR x 10 bilateral  Hooklying hip abduction with yellow loop 2 x 10 Manual: Addaday to lumbar paraspinals for improved blood flow and muscle elongation. PT present to monitor status.    08/27/2023 NuStep Level 3 5 mins- PT present to discuss status Seated hamstring stretch 2 x 30 sec bilateral  Seated 3 way stability ball stretch x 8 each direction 5 sec hold Hooklying LTR x 8 each direction 5 sec hold Hooklying TA activation x 20 (verbal and tactile cues) Hooklying hip abduction with yellow loop 2 x 10 Hooklying hip adduction with blue ball 2 x 10 Hooklying alt hand and knee press x 10 each side Seated right hip adduction stretch 2 x 20 sec Seated ball press 2 x 10 Seated shoulder ER yellow TB  x 10 6 inch step taps x 20 Hurdles (forwards/ sideways) x 3 laps Manual: Addaday to lumbar paraspinals for improved blood flow and muscle elongation. PT present to monitor status.    08/21/2023 Seated hamstring stretch 2 x 30 sec bilateral  Seated QL stretch 2x 5 sec hold bilateral  Seated ball press 2 x 10 Sit to hand (hands on legs) x 10 Seated hip abduction with yellow loop 2 x 10 Seated hip adduction ball squeeze 2 x 10 Seated piriformis stretch (legs crossed at ankle) 2 x 20 sec bilateral  Seated 3 way stability ball stretch x 7 each direction 5 sec hold Seated shoulder ER yellow TB 2 x 10 Discussion on sleeping position NuStep Level 1 5 mins- PT present to discuss status      PATIENT EDUCATION:  Education details: POC; hydration; logroll technique Person educated: Patient and Child(ren) Education method: Explanation, Demonstration, and Handouts Education comprehension: verbalized understanding, returned demonstration, and needs  further education  HOME EXERCISE PROGRAM: Access Code: [email or web address] URL: [email or web address] Date: 08/27/2023 Prepared by: Penelope Bowie  Exercises - Seated Hamstring Stretch  - 1 x daily - 7 x weekly - 2 sets - 20-30 hold - Seated Quadratus Lumborum Stretch in Chair  - 1 x daily - 7 x weekly - 2 sets - 5-10 hold - Seated Abdominal Press into [employer]  - 1 x daily - 7 x weekly - 2 sets - 10 reps - 3-4 hold - Sit to Stand with Armchair  - 1 x daily - 7 x weekly - 1 sets - 10 reps - Seated Piriformis Stretch  - 1 x daily - 7 x weekly - 2 sets - 20 hold - Supine Lower Trunk Rotation  - 1 x daily - 7 x weekly - 1 sets - 8-10 reps - 5 hold - Supine Transversus Abdominis Bracing - Hands on Stomach  - 1 x daily - 7 x weekly - 2 sets - 10 reps - 3-4 hold - Seated Hip Adductor Stretch  - 1 x daily - 7 x weekly - 2 sets - 20 hold  ASSESSMENT:  CLINICAL IMPRESSION: Mariko presents to therapy with 6/10 back pain. She did not verbalize any pain or discomfort after last treatment session. Incorporated balance on unstable surfaces and weight shifting was a good challenge for patient. She required close guarding when performing step ups with no UE support. Progressed patient's core exercises and she required verbal and visual cues for correct performance. She tolerated treatment session well and verbalized a decrease in pain at end of session. Patient will benefit from skilled PT to address the below impairments and improve overall function.     OBJECTIVE  IMPAIRMENTS: Abnormal gait, decreased balance, decreased endurance, decreased mobility, decreased ROM, decreased strength, dizziness, increased muscle spasms, impaired flexibility, impaired sensation, postural dysfunction, and pain.   ACTIVITY LIMITATIONS: lifting, bending, sitting, standing, stairs, transfers, and locomotion level  PARTICIPATION LIMITATIONS: meal prep, cleaning, interpersonal relationship, shopping, and community  activity  PERSONAL FACTORS: Age, Fitness, Time since onset of injury/illness/exacerbation, and 3+ comorbidities: depression; HTN; pacemaker; neuropathy are also affecting patient's functional outcome.   REHAB POTENTIAL: Good  CLINICAL DECISION MAKING: Evolving/moderate complexity  EVALUATION COMPLEXITY: High   GOALS: Goals reviewed with patient? Yes  SHORT TERM GOALS: Target date: 09/03/2023  Patient will be independent with initial HEP. Baseline:  Goal status: INITIAL  2.  Patient will report > or = to 30% improvement in back pain since starting PT. Baseline:  Goal status: INITIAL  3.  Patient will demonstrate control when performing sit to stand. Baseline:  Goal status: INITIAL   LONG TERM GOALS: Target date: 10/01/2023  Patient will demonstrate independence in advanced HEP. Baseline:  Goal status: INITIAL  2.  Patient will report > or = to 50% improvement in back pain since starting PT. Baseline:  Goal status: INITIAL  3.  Patient will verbalize and demonstrate self-care strategies to manage pain including tissue mobility practices and change of position. Baseline:  Goal status: INITIAL  4.  Patient will score < or = to 17sec on 5STS to maintain independence with transfers.  Baseline: 21.54 sec Goal status: INITIAL  5.  Patient will score < or = to 15 sec on TUG to decrease risk of falls. Baseline: 18.18 sec Goal status: INITIAL  6.  Patient will score < or = to 20/50 on ODI to decrease self perceived disability. Baseline: 26/50 52% Goal status: INITIAL  PLAN:  PT FREQUENCY: 1x/week  PT DURATION: 8 weeks  PLANNED INTERVENTIONS: 97164- PT Re-evaluation, 97110-Therapeutic exercises, 97530- Therapeutic activity, 97112- Neuromuscular re-education, 97535- Self Care, 08657- Manual therapy, [id number]- Gait training, [phone]- Canalith repositioning, [id number]- Aquatic Therapy, 97016- Vasopneumatic device, [id number]- Ultrasound, [id number]- Traction (mechanical), [id number]-  Ionotophoresis 4mg /ml Dexamethasone , Patient/Family education, Balance training, Stair training, Taping, Dry Needling, Joint mobilization, Joint manipulation, Spinal manipulation, Spinal mobilization, Vestibular training, Cryotherapy, and Moist heat.  PLAN FOR NEXT SESSION: ; continue core & functional strengthening; balance  Penelope Bowie, PT 09/04/23 2:47 PM [facility] [address] [place], [place] [address] Phone # [phone] Fax [phone]

## 2023-09-11 ENCOUNTER — Encounter: Payer: Self-pay | Admitting: Physical Therapy

## 2023-09-11 ENCOUNTER — Ambulatory Visit: Attending: Sports Medicine | Admitting: Physical Therapy

## 2023-09-11 DIAGNOSIS — R293 Abnormal posture: Secondary | ICD-10-CM | POA: Diagnosis present

## 2023-09-11 DIAGNOSIS — R262 Difficulty in walking, not elsewhere classified: Secondary | ICD-10-CM | POA: Diagnosis present

## 2023-09-11 DIAGNOSIS — M5459 Other low back pain: Secondary | ICD-10-CM | POA: Insufficient documentation

## 2023-09-11 DIAGNOSIS — M6281 Muscle weakness (generalized): Secondary | ICD-10-CM | POA: Diagnosis present

## 2023-09-11 DIAGNOSIS — R252 Cramp and spasm: Secondary | ICD-10-CM | POA: Diagnosis present

## 2023-09-11 NOTE — Therapy (Signed)
 OUTPATIENT PHYSICAL THERAPY THORACOLUMBAR TREATMENT   Patient Name: [name] MRN: [id number] DOB:1933/05/24, 89 y.o., female Today's Date: 09/11/2023  END OF SESSION:  PT End of Session - 09/11/23 1548     Visit Number 5    Date for PT Re-Evaluation 10/01/23    Authorization Type [employer] approved 8 visits 08/06/23-10/01/23 [id number]    Authorization Time Period 08/06/23-10/01/23    Authorization - Visit Number 4    Authorization - Number of Visits 8    Progress Note Due on Visit 10    PT Start Time 1359    PT Stop Time 1445    PT Time Calculation (min) 46 min    Activity Tolerance Patient tolerated treatment well    Behavior During Therapy [facility] for tasks assessed/performed                 Past Medical History:  Diagnosis Date   Arthritis    Cancer (HCC)    left breast cancer    Cataracts, bilateral    removed by surgery   CHF (congestive heart failure) (HCC)    PACEMAKER & DEFIB   Complication of anesthesia    hypotensive after back surgery in 2006   Depression    Dyslipidemia    Fainted 04/21/06   AT [facility]   GERD (gastroesophageal reflux disease)    Headache(784.0)    Hearing loss    bilateral hearing aids   HLD (hyperlipidemia)    diet controlled    Hypertension    Hypothyroidism    ICD (implantable cardiac defibrillator) in place    pt has pacer/icd   ICD (implantable cardiac defibrillator), biventricular, in situ    LBBB (left bundle branch block)    Memory loss    Nonischemic cardiomyopathy (HCC)    Normal coronary arteries    s/p cardiac cath 2007   Pacemaker    ICD [employer]   Syncope    Systolic CHF [facility])    Vertigo    Wears glasses    Past Surgical History:  Procedure Laterality Date   BACK SURGERY     lumbar fusion    BIV ICD GENERATOR CHANGEOUT N/A 02/06/2023   Procedure: BIV ICD GENERATOR CHANGEOUT;  Surgeon: Tammie Fall, MD;  Location: [facility] INVASIVE CV LAB;  Service: Cardiovascular;  Laterality: N/A;   BREAST  LUMPECTOMY Left 2008   BREAST SURGERY  2000   LUMP REMOVAL. STAGE 1 CANCER   CARDIAC CATHETERIZATION     CATARACT EXTRACTION     COLONOSCOPY     EYE SURGERY     IMPLANTABLE CARDIOVERTER DEFIBRILLATOR GENERATOR CHANGE N/A 12/18/2012   Procedure: IMPLANTABLE CARDIOVERTER DEFIBRILLATOR GENERATOR CHANGE;  Surgeon: Tammie Fall, MD;  Location[facility] CATH LAB;  Service: Cardiovascular;  Laterality: N/A;   IR INJECT/THERA/INC NEEDLE/CATH/PLC EPI/LUMB/SAC W/IMG  06/28/2023   JOINT REPLACEMENT  06/14/01   right   LUMBAR FUSION  2006   MASS EXCISION  11/08/2011   Procedure: EXCISION MASS;  Surgeon: Lockie Rima, MD;  Location: [facility] ORS;  Service: General;  Laterality: Left;  Excision Left Thigh Mass   MASTECTOMY W/ SENTINEL NODE BIOPSY Left 06/04/2019   Procedure: LEFT MASTECTOMY WITH SENTINEL LYMPH NODE BIOPSY;  Surgeon: Lockie Rima, MD;  Location: [facility] OR;  Service: General;  Laterality: Left;   MASTECTOMY, PARTIAL  2008   GOT PACEMAKER AND DEFIB AT THAT TIME   PACEMAKER INSERTION  04/23/06   TOTAL KNEE ARTHROPLASTY  05/17/01   RIGHT KNEE   TOTAL  KNEE ARTHROPLASTY Left 11/29/2014   Procedure: TOTAL LEFT KNEE ARTHROPLASTY;  Surgeon: Claiborne Crew, MD;  Location: [facility] ORS;  Service: Orthopedics;  Laterality: Left;   Patient Active Problem List   Diagnosis Date Noted   Spinal stenosis of lumbar region with neurogenic claudication 08/28/2023   Nerve pain due to spinal stenosis 08/28/2023   Piriformis syndrome of both sides 01/30/2023   Neuropathy 08/01/2022   Atherosclerosis of native artery of both lower extremities with rest pain (HCC) 07/09/2022   Interstitial pulmonary disease (HCC) 07/09/2022   Sciatica associated with disorder of multiple sites of spine 03/20/2021   Bipolar II disorder (HCC) 03/20/2021   Chronic pain syndrome 03/20/2021   Urinary retention 02/04/2020   Seasonal allergies 02/04/2020   Recurrent major depressive disorder, in partial remission (HCC) 07/17/2019   Status post left  mastectomy 07/01/2019   Breast cancer of lower-outer quadrant of left female breast (HCC) 06/04/2019   Candida infection, oral 01/15/2019   Senile purpura (HCC) 04/14/2018   Lumbar post-laminectomy syndrome 12/03/2017   Lumbar spondylosis 12/03/2017   History of back surgery 09/01/2017   Constipation 09/01/2017   Hypothyroidism due to acquired atrophy of thyroid 09/01/2017   Mixed hyperlipidemia 09/01/2017   Age-related osteoporosis without current pathological fracture 09/01/2017   High risk medication use 09/01/2017   Arthritis of hand 05/17/2017   Atherosclerosis of native arteries of extremity with intermittent claudication (HCC) 05/15/2017   Status post total bilateral knee replacement 12/20/2016   Gait abnormality 07/16/2016   Chronic low back pain 07/16/2016   Mild cognitive impairment 07/16/2016   Wrist pain 05/10/2015   S/P left TKA 11/29/2014   S/P knee replacement 11/29/2014   Spontaneous bruising 08/27/2014   Numbness and tingling in right hand 07/28/2014   Essential tremor 07/28/2014   Dizziness 05/28/2014   Shaky 05/28/2014   Memory loss 05/28/2014   Depression 05/06/2014   Lipoma of left upper thigh 3x5 cm 09/28/2011   Cerebral vascular accident (HCC) 08/09/2011   Syncope 08/09/2011   History of breast cancer [id number], s/p BCT 2008, triple negative 01/26/2011   Chronic L breast pain with chronic recurrent seroma, s/p excisional biopsy 01/12/2010 01/26/2011   Fainted    ICD (implantable cardioverter-defibrillator), biventricular, in situ 08/02/2010   Chronic systolic heart failure (HCC) 08/02/2010   Essential hypertension 08/02/2010    PCP: [facility], Linnell Richardson, NP  REFERRING PROVIDER: Ulysees Gander, DO  REFERRING DIAG:  Diagnosis  M54.42,M54.41,G89.29 (ICD-10-CM) - Chronic bilateral low back pain with bilateral sciatica  R29.898 (ICD-10-CM) - Right leg weakness  R29.898 (ICD-10-CM) - Left leg weakness  M51.362 (ICD-10-CM) - Degeneration of  intervertebral disc of lumbar region with discogenic back pain and lower extremity pain  Z98.1 (ICD-10-CM) - History of lumbar fusion  Z88.8 (ICD-10-CM) - Allergy to iodine   G89.29,M54.41 (ICD-10-CM) - Chronic bilateral low back pain with right-sided sciatic    Rationale for Evaluation and Treatment: Rehabilitation  THERAPY DIAG:  Other low back pain  Muscle weakness (generalized)  Abnormal posture  Difficulty in walking, not elsewhere classified  Cramp and spasm  ONSET DATE: Chronic but recent flare up November 2024  SUBJECTIVE:  SUBJECTIVE STATEMENT: Patient reports the pain is the same today. Pain 6/10. She has been sleepy since she started a new medication  From Eval: Patient presents with back pain that travels down both legs but it hurts more her right leg. Leg pain is more recent it began November 2024. When pain travels down legs she has increased muscle spasms and it feels like a squeezing sensation. Her sitting tolerance is limited to 30 minutes to an hour and she is not able to bend to pick up objects. Patient has occasional numbness and tingling in her legs when she is walking. When she sits down the pain takes a while to go away.  Patient's daughter Bartholomew Light is present at eval  PERTINENT HISTORY:  Hx lumbar fusion; Hx Left breast cancer; Left breast lumpectomy; depression, pacemaker; HTN; Vertigo; Hx bilateral TKA; neuropathy   PAIN:  Are you having pain? Yes: NPRS scale: 6(currently) 7-8(worst)/10 Pain location: both sides of lumbar side + bilateral legs Pain description: achy Aggravating factors: Bending; Sitting > 30 min - an hour Relieving factors: Nothing  PRECAUTIONS: None  RED FLAGS: None   WEIGHT BEARING RESTRICTIONS: No  FALLS:  Has patient fallen in last 6 months? No  Sometimes patient feels unsteady when she is walking and she would like to address this with physical therapy  LIVING ENVIRONMENT: Lives with: lives with their daughter Lives in: House/apartment Stairs: No Has following equipment at home: Counselling psychologist, shower chair, Grab bars, and Bedrails  OCCUPATION: Retired  PLOF: Independent with basic ADLs and Independent with household mobility with device  PATIENT GOALS: To get rid of back pain  NEXT MD VISIT: PRN  OBJECTIVE:  Note: Objective measures were completed at Evaluation unless otherwise noted.   PATIENT SURVEYS:  Modified Oswestry 26/50 52%   COGNITION: Overall cognitive status: Within functional limits for tasks assessed     SENSATION: Occasional numbness and tingling in legs  MUSCLE LENGTH: Hamstrings: decreased bilateral    POSTURE: rounded shoulders, forward head, and increased thoracic kyphosis    LUMBAR ROM: all motions limited and painful.    LOWER EXTREMITY ROM:   PROM WFL; pain with Rt hip abduction PROM    LOWER EXTREMITY MMT:  * pain in back  MMT Right eval Left eval  Hip flexion 4-* 4-  Hip extension    Hip abduction 4- 4-  Hip adduction 4- 4-  Hip internal rotation    Hip external rotation    Knee flexion 4-* 4-  Knee extension 4- 4-  Ankle dorsiflexion    Ankle plantarflexion    Ankle inversion    Ankle eversion     (Blank rows = not tested)    FUNCTIONAL TESTS:  5 times sit to stand: 21.54 sec attempted no UE first stand but patient fell back. Used UE support for remaining stands Timed up and go (TUG): 18.18 sec with SBQC  GAIT: Assistive device utilized: Quad cane small base Level of assistance: SBA Comments: decreased cadence; decreased step length bilateral ; flexed trunk  TREATMENT DATE:  09/11/2023 NuStep Level 4 6 mins- PT present to discuss status Seated 3 way stability ball stretch x 8 each direction 5 sec hold Supine LTR x 8 each direction 5 sec  hold Hooklying alt hand and knee press with beach ball in between x 10 each TA activation + SLR x 10 bilateral  Hooklying hip abduction with yellow loop 2 x 10 Supine hip flexion stretch 2 x 30 sec bilateral  Seated hamstring stretch 2 x 30 bilateral  Cone tap (two cones in front) x 8 total ; no UE support last 4 reps) Airex Step ups x 10 bilateral (no UE support last 5 session) Manual: Addaday to lumbar paraspinals for improved blood flow and muscle elongation. PT present to monitor status.     09/04/2023 NuStep Level 2 5 mins- PT present to discuss status Airex Step ups x 10 bilateral (no UE support for last few repetitions)  Weight shifting on airex (side to side; staggered) x 1 min each direction Seated 3 way stability ball stretch x 8 each direction 5 sec hold Hooklying TA activation x 10  Hooklying TA activation+ ball squeeze 2 x 10 Hooklying alt hand and knee press with beach ball in between x 10 each TA activation + SLR x 10 bilateral  Hooklying hip abduction with yellow loop 2 x 10 Manual: Addaday to lumbar paraspinals for improved blood flow and muscle elongation. PT present to monitor status.    08/27/2023 NuStep Level 3 5 mins- PT present to discuss status Seated hamstring stretch 2 x 30 sec bilateral  Seated 3 way stability ball stretch x 8 each direction 5 sec hold Hooklying LTR x 8 each direction 5 sec hold Hooklying TA activation x 20 (verbal and tactile cues) Hooklying hip abduction with yellow loop 2 x 10 Hooklying hip adduction with blue ball 2 x 10 Hooklying alt hand and knee press x 10 each side Seated right hip adduction stretch 2 x 20 sec Seated ball press 2 x 10 Seated shoulder ER yellow TB  x 10 6 inch step taps x 20 Hurdles (forwards/ sideways) x 3 laps Manual: Addaday to lumbar paraspinals for improved blood flow and muscle elongation. PT present to monitor status.     PATIENT EDUCATION:  Education details: POC; hydration; logroll  technique Person educated: Patient and Child(ren) Education method: Explanation, Demonstration, and Handouts Education comprehension: verbalized understanding, returned demonstration, and needs further education  HOME EXERCISE PROGRAM: Access Code: [email or web address] URL: [email or web address] Date: 08/27/2023 Prepared by: Penelope Bowie  Exercises - Seated Hamstring Stretch  - 1 x daily - 7 x weekly - 2 sets - 20-30 hold - Seated Quadratus Lumborum Stretch in Chair  - 1 x daily - 7 x weekly - 2 sets - 5-10 hold - Seated Abdominal Press into [employer]  - 1 x daily - 7 x weekly - 2 sets - 10 reps - 3-4 hold - Sit to Stand with Armchair  - 1 x daily - 7 x weekly - 1 sets - 10 reps - Seated Piriformis Stretch  - 1 x daily - 7 x weekly - 2 sets - 20 hold - Supine Lower Trunk Rotation  - 1 x daily - 7 x weekly - 1 sets - 8-10 reps - 5 hold - Supine Transversus Abdominis Bracing - Hands on Stomach  - 1 x daily - 7 x weekly - 2 sets - 10 reps - 3-4 hold - Seated Hip Adductor Stretch  - 1 x daily - 7 x weekly - 2 sets - 20 hold  ASSESSMENT:  CLINICAL IMPRESSION: Lachlan presents to therapy with increased back pain. She has been compliant with HEP and demonstrated appropriate TA activation. Standing balance exercises are a good challenge for her especially with UE support is eliminated. Noted improved performance of step up exercise on unstable surface. She responds well to manual therapy techniques targeting paraspinals. Patient will benefit from skilled PT to address the below  impairments and improve overall function.      OBJECTIVE IMPAIRMENTS: Abnormal gait, decreased balance, decreased endurance, decreased mobility, decreased ROM, decreased strength, dizziness, increased muscle spasms, impaired flexibility, impaired sensation, postural dysfunction, and pain.   ACTIVITY LIMITATIONS: lifting, bending, sitting, standing, stairs, transfers, and locomotion level  PARTICIPATION LIMITATIONS:  meal prep, cleaning, interpersonal relationship, shopping, and community activity  PERSONAL FACTORS: Age, Fitness, Time since onset of injury/illness/exacerbation, and 3+ comorbidities: depression; HTN; pacemaker; neuropathy are also affecting patient's functional outcome.   REHAB POTENTIAL: Good  CLINICAL DECISION MAKING: Evolving/moderate complexity  EVALUATION COMPLEXITY: High   GOALS: Goals reviewed with patient? Yes  SHORT TERM GOALS: Target date: 09/03/2023  Patient will be independent with initial HEP. Baseline:  Goal status: INITIAL  2.  Patient will report > or = to 30% improvement in back pain since starting PT. Baseline:  Goal status: INITIAL  3.  Patient will demonstrate control when performing sit to stand. Baseline:  Goal status: INITIAL   LONG TERM GOALS: Target date: 10/01/2023  Patient will demonstrate independence in advanced HEP. Baseline:  Goal status: INITIAL  2.  Patient will report > or = to 50% improvement in back pain since starting PT. Baseline:  Goal status: INITIAL  3.  Patient will verbalize and demonstrate self-care strategies to manage pain including tissue mobility practices and change of position. Baseline:  Goal status: INITIAL  4.  Patient will score < or = to 17sec on 5STS to maintain independence with transfers.  Baseline: 21.54 sec Goal status: INITIAL  5.  Patient will score < or = to 15 sec on TUG to decrease risk of falls. Baseline: 18.18 sec Goal status: INITIAL  6.  Patient will score < or = to 20/50 on ODI to decrease self perceived disability. Baseline: 26/50 52% Goal status: INITIAL  PLAN:  PT FREQUENCY: 1x/week  PT DURATION: 8 weeks  PLANNED INTERVENTIONS: 97164- PT Re-evaluation, 97110-Therapeutic exercises, 97530- Therapeutic activity, [id number]- Neuromuscular re-education, 97535- Self Care, 95621- Manual therapy, [id number]- Gait training, [id number]- Canalith repositioning, [id number]- Aquatic Therapy, 97016- Vasopneumatic  device, [id number]- Ultrasound, [id number]- Traction (mechanical), [id number]- Ionotophoresis 4mg /ml Dexamethasone , Patient/Family education, Balance training, Stair training, Taping, Dry Needling, Joint mobilization, Joint manipulation, Spinal manipulation, Spinal mobilization, Vestibular training, Cryotherapy, and Moist heat.  PLAN FOR NEXT SESSION: assess tolerance to treatment session; continue core & functional strengthening; balance  Penelope Bowie, PT 09/11/23 3:49 PM [facility] [address], [address] [place], [place] [address] Phone # [phone] Fax [phone]

## 2023-09-17 ENCOUNTER — Other Ambulatory Visit: Payer: Self-pay

## 2023-09-17 ENCOUNTER — Ambulatory Visit (INDEPENDENT_AMBULATORY_CARE_PROVIDER_SITE_OTHER)

## 2023-09-17 DIAGNOSIS — Z4682 Encounter for fitting and adjustment of non-vascular catheter: Secondary | ICD-10-CM | POA: Diagnosis not present

## 2023-09-17 MED ORDER — NONFORMULARY OR COMPOUNDED ITEM: 120.0000 | Freq: Every day | Status: AC

## 2023-09-17 NOTE — Patient Instructions (Signed)
 Please keep all scheduled appointments.   It was a pleasure to see you today!  Thank you for trusting me with your care!

## 2023-09-17 NOTE — Telephone Encounter (Signed)
 Rx was manually faxed. See media

## 2023-09-17 NOTE — Progress Notes (Signed)
 [name] is a 89 y.o. female came in for a cath teaching.  Pt was able to demonstrate self-cathing with a 12 fr catheter.  Patient ad her daughter succeeded with self catheter teaching. All supplies and  material was given to the patient along with sample catheters. All questions have been answered.

## 2023-09-18 ENCOUNTER — Encounter: Payer: Self-pay | Admitting: Physical Therapy

## 2023-09-18 ENCOUNTER — Ambulatory Visit: Admitting: Physical Therapy

## 2023-09-18 DIAGNOSIS — R262 Difficulty in walking, not elsewhere classified: Secondary | ICD-10-CM

## 2023-09-18 DIAGNOSIS — R293 Abnormal posture: Secondary | ICD-10-CM

## 2023-09-18 DIAGNOSIS — M5459 Other low back pain: Secondary | ICD-10-CM | POA: Diagnosis not present

## 2023-09-18 DIAGNOSIS — M6281 Muscle weakness (generalized): Secondary | ICD-10-CM

## 2023-09-18 DIAGNOSIS — R252 Cramp and spasm: Secondary | ICD-10-CM

## 2023-09-18 NOTE — Therapy (Signed)
 OUTPATIENT PHYSICAL THERAPY THORACOLUMBAR TREATMENT   Patient Name: [name] MRN: [id number] DOB:[birth year], 89 y.o., female Today's Date: 09/18/2023  END OF SESSION:  PT End of Session - 09/18/23 1555     Visit Number 6    Date for PT Re-Evaluation 10/01/23    Authorization Type [employer] approved 8 visits 08/06/23-10/01/23 [id number]    Authorization Time Period 08/06/23-10/01/23    Authorization - Visit Number 5    Authorization - Number of Visits 8    Progress Note Due on Visit 10    PT Start Time 1400    PT Stop Time 1448    PT Time Calculation (min) 48 min    Activity Tolerance Patient tolerated treatment well    Behavior During Therapy [facility] for tasks assessed/performed                  Past Medical History:  Diagnosis Date   Arthritis    Cancer (HCC)    left breast cancer    Cataracts, bilateral    removed by surgery   CHF (congestive heart failure) (HCC)    PACEMAKER & DEFIB   Complication of anesthesia    hypotensive after back surgery in 2006   Depression    Dyslipidemia    Fainted 04/21/06   AT [facility]   GERD (gastroesophageal reflux disease)    Headache(784.0)    Hearing loss    bilateral hearing aids   HLD (hyperlipidemia)    diet controlled    Hypertension    Hypothyroidism    ICD (implantable cardiac defibrillator) in place    pt has pacer/icd   ICD (implantable cardiac defibrillator), biventricular, in situ    LBBB (left bundle branch block)    Memory loss    Nonischemic cardiomyopathy (HCC)    Normal coronary arteries    s/p cardiac cath 2007   Pacemaker    ICD [employer]   Syncope    Systolic CHF [facility])    Vertigo    Wears glasses    Past Surgical History:  Procedure Laterality Date   BACK SURGERY     lumbar fusion    BIV ICD GENERATOR CHANGEOUT N/A 02/06/2023   Procedure: BIV ICD GENERATOR CHANGEOUT;  Surgeon: Tammie Fall, MD;  Location: [facility] INVASIVE CV LAB;  Service: Cardiovascular;  Laterality: N/A;    BREAST LUMPECTOMY Left 2008   BREAST SURGERY  2000   LUMP REMOVAL. STAGE 1 CANCER   CARDIAC CATHETERIZATION     CATARACT EXTRACTION     COLONOSCOPY     EYE SURGERY     IMPLANTABLE CARDIOVERTER DEFIBRILLATOR GENERATOR CHANGE N/A 12/18/2012   Procedure: IMPLANTABLE CARDIOVERTER DEFIBRILLATOR GENERATOR CHANGE;  Surgeon: Tammie Fall, MD;  Location: [facility] CATH LAB;  Service: Cardiovascular;  Laterality: N/A;   IR INJECT/THERA/INC NEEDLE/CATH/PLC EPI/LUMB/SAC W/IMG  06/28/2023   JOINT REPLACEMENT  06/14/01   right   LUMBAR FUSION  2006   MASS EXCISION  11/08/2011   Procedure: EXCISION MASS;  Surgeon: Lockie Rima, MD;  Location: [facility] ORS;  Service: General;  Laterality: Left;  Excision Left Thigh Mass   MASTECTOMY W/ SENTINEL NODE BIOPSY Left 06/04/2019   Procedure: LEFT MASTECTOMY WITH SENTINEL LYMPH NODE BIOPSY;  Surgeon: Lockie Rima, MD;  Location: [facility] OR;  Service: General;  Laterality: Left;   MASTECTOMY, PARTIAL  2008   GOT PACEMAKER AND DEFIB AT THAT TIME   PACEMAKER INSERTION  04/23/06   TOTAL KNEE ARTHROPLASTY  05/17/01   RIGHT KNEE  TOTAL KNEE ARTHROPLASTY Left 11/29/2014   Procedure: TOTAL LEFT KNEE ARTHROPLASTY;  Surgeon: Claiborne Crew, MD;  Location: [facility] ORS;  Service: Orthopedics;  Laterality: Left;   Patient Active Problem List   Diagnosis Date Noted   Spinal stenosis of lumbar region with neurogenic claudication 08/28/2023   Nerve pain due to spinal stenosis 08/28/2023   Piriformis syndrome of both sides 01/30/2023   Neuropathy 08/01/2022   Atherosclerosis of native artery of both lower extremities with rest pain (HCC) 07/09/2022   Interstitial pulmonary disease (HCC) 07/09/2022   Sciatica associated with disorder of multiple sites of spine 03/20/2021   Bipolar II disorder (HCC) 03/20/2021   Chronic pain syndrome 03/20/2021   Urinary retention 02/04/2020   Seasonal allergies 02/04/2020   Recurrent major depressive disorder, in partial remission (HCC) 07/17/2019   Status post left  mastectomy 07/01/2019   Breast cancer of lower-outer quadrant of left female breast (HCC) 06/04/2019   Candida infection, oral 01/15/2019   Senile purpura (HCC) 04/14/2018   Lumbar post-laminectomy syndrome 12/03/2017   Lumbar spondylosis 12/03/2017   History of back surgery 09/01/2017   Constipation 09/01/2017   Hypothyroidism due to acquired atrophy of thyroid 09/01/2017   Mixed hyperlipidemia 09/01/2017   Age-related osteoporosis without current pathological fracture 09/01/2017   High risk medication use 09/01/2017   Arthritis of hand 05/17/2017   Atherosclerosis of native arteries of extremity with intermittent claudication (HCC) 05/15/2017   Status post total bilateral knee replacement 12/20/2016   Gait abnormality 07/16/2016   Chronic low back pain 07/16/2016   Mild cognitive impairment 07/16/2016   Wrist pain 05/10/2015   S/P left TKA 11/29/2014   S/P knee replacement 11/29/2014   Spontaneous bruising 08/27/2014   Numbness and tingling in right hand 07/28/2014   Essential tremor 07/28/2014   Dizziness 05/28/2014   Shaky 05/28/2014   Memory loss 05/28/2014   Depression 05/06/2014   Lipoma of left upper thigh 3x5 cm 09/28/2011   Cerebral vascular accident (HCC) 08/09/2011   Syncope 08/09/2011   History of breast cancer [id number], s/p BCT 2008, triple negative 01/26/2011   Chronic L breast pain with chronic recurrent seroma, s/p excisional biopsy 01/12/2010 01/26/2011   Fainted    ICD (implantable cardioverter-defibrillator), biventricular, in situ 08/02/2010   Chronic systolic heart failure (HCC) 08/02/2010   Essential hypertension 08/02/2010    PCP: [facility], Linnell Richardson, NP  REFERRING PROVIDER: Ulysees Gander, DO  REFERRING DIAG:  Diagnosis  M54.42,M54.41,G89.29 (ICD-10-CM) - Chronic bilateral low back pain with bilateral sciatica  R29.898 (ICD-10-CM) - Right leg weakness  R29.898 (ICD-10-CM) - Left leg weakness  M51.362 (ICD-10-CM) - Degeneration of  intervertebral disc of lumbar region with discogenic back pain and lower extremity pain  Z98.1 (ICD-10-CM) - History of lumbar fusion  Z88.8 (ICD-10-CM) - Allergy to iodine   G89.29,M54.41 (ICD-10-CM) - Chronic bilateral low back pain with right-sided sciatic    Rationale for Evaluation and Treatment: Rehabilitation  THERAPY DIAG:  Other low back pain  Muscle weakness (generalized)  Abnormal posture  Difficulty in walking, not elsewhere classified  Cramp and spasm  ONSET DATE: Chronic but recent flare up November 2024  SUBJECTIVE:  SUBJECTIVE STATEMENT: Patient reports she feels the same today. Her back pain never goes below 6/10.  From Eval: Patient presents with back pain that travels down both legs but it hurts more her right leg. Leg pain is more recent it began November 2024. When pain travels down legs she has increased muscle spasms and it feels like a squeezing sensation. Her sitting tolerance is limited to 30 minutes to an hour and she is not able to bend to pick up objects. Patient has occasional numbness and tingling in her legs when she is walking. When she sits down the pain takes a while to go away.  Patient's daughter Bartholomew Light is present at eval  PERTINENT HISTORY:  Hx lumbar fusion; Hx Left breast cancer; Left breast lumpectomy; depression, pacemaker; HTN; Vertigo; Hx bilateral TKA; neuropathy   PAIN:  Are you having pain? Yes: NPRS scale: 6(currently) 7-8(worst)/10 Pain location: both sides of lumbar side + bilateral legs Pain description: achy Aggravating factors: Bending; Sitting > 30 min - an hour Relieving factors: Nothing  PRECAUTIONS: None  RED FLAGS: None   WEIGHT BEARING RESTRICTIONS: No  FALLS:  Has patient fallen in last 6 months? No Sometimes patient feels  unsteady when she is walking and she would like to address this with physical therapy  LIVING ENVIRONMENT: Lives with: lives with their daughter Lives in: House/apartment Stairs: No Has following equipment at home: Counselling psychologist, shower chair, Grab bars, and Bedrails  OCCUPATION: Retired  PLOF: Independent with basic ADLs and Independent with household mobility with device  PATIENT GOALS: To get rid of back pain  NEXT MD VISIT: PRN  OBJECTIVE:  Note: Objective measures were completed at Evaluation unless otherwise noted.   PATIENT SURVEYS:  Modified Oswestry 26/50 52%   COGNITION: Overall cognitive status: Within functional limits for tasks assessed     SENSATION: Occasional numbness and tingling in legs  MUSCLE LENGTH: Hamstrings: decreased bilateral    POSTURE: rounded shoulders, forward head, and increased thoracic kyphosis    LUMBAR ROM: all motions limited and painful.    LOWER EXTREMITY ROM:   PROM WFL; pain with Rt hip abduction PROM    LOWER EXTREMITY MMT:  * pain in back  MMT Right eval Left eval  Hip flexion 4-* 4-  Hip extension    Hip abduction 4- 4-  Hip adduction 4- 4-  Hip internal rotation    Hip external rotation    Knee flexion 4-* 4-  Knee extension 4- 4-  Ankle dorsiflexion    Ankle plantarflexion    Ankle inversion    Ankle eversion     (Blank rows = not tested)    FUNCTIONAL TESTS:  5 times sit to stand: 21.54 sec attempted no UE first stand but patient fell back. Used UE support for remaining stands Timed up and go (TUG): 18.18 sec with SBQC  GAIT: Assistive device utilized: Quad cane small base Level of assistance: SBA Comments: decreased cadence; decreased step length bilateral ; flexed trunk  TREATMENT DATE:  09/18/2023 NuStep Level 4 7 mins- PT present to discuss status Seated 3 way stability ball stretch x 8 each direction 5 sec hold Manual spinal decompression: Patient's feet on red stability ball and PT  distracting at hips using a gait belt Supine LTR with feet on red stability ball x 10 each direction 5 sec hold Alt hand and knee press with red stability ball x 10 each direction Supine glute stretch 2 x 30 sec bilateral  TFL  stretch with green strap 2 x 30 sec bilateral  Hooklying hip abduction with yellow loop 2 x 10 Manual: Addaday to lumbar paraspinals & gluteals for improved blood flow and muscle elongation. PT present to monitor status.    09/11/2023 NuStep Level 4 6 mins- PT present to discuss status Seated 3 way stability ball stretch x 8 each direction 5 sec hold Supine LTR x 8 each direction 5 sec hold Hooklying alt hand and knee press with beach ball in between x 10 each TA activation + SLR x 10 bilateral  Hooklying hip abduction with yellow loop 2 x 10 Supine hip flexion stretch 2 x 30 sec bilateral  Seated hamstring stretch 2 x 30 bilateral  Cone tap (two cones in front) x 8 total ; no UE support last 4 reps) Airex Step ups x 10 bilateral (no UE support last 5 session) Manual: Addaday to lumbar paraspinals for improved blood flow and muscle elongation. PT present to monitor status.     09/04/2023 NuStep Level 2 5 mins- PT present to discuss status Airex Step ups x 10 bilateral (no UE support for last few repetitions)  Weight shifting on airex (side to side; staggered) x 1 min each direction Seated 3 way stability ball stretch x 8 each direction 5 sec hold Hooklying TA activation x 10  Hooklying TA activation+ ball squeeze 2 x 10 Hooklying alt hand and knee press with beach ball in between x 10 each TA activation + SLR x 10 bilateral  Hooklying hip abduction with yellow loop 2 x 10 Manual: Addaday to lumbar paraspinals for improved blood flow and muscle elongation. PT present to monitor status.     PATIENT EDUCATION:  Education details: POC; hydration; logroll technique Person educated: Patient and Child(ren) Education method: Explanation, Demonstration, and  Handouts Education comprehension: verbalized understanding, returned demonstration, and needs further education  HOME EXERCISE PROGRAM: Access Code: [email or web address] URL: [email or web address] Date: 08/27/2023 Prepared by: Penelope Bowie  Exercises - Seated Hamstring Stretch  - 1 x daily - 7 x weekly - 2 sets - 20-30 hold - Seated Quadratus Lumborum Stretch in Chair  - 1 x daily - 7 x weekly - 2 sets - 5-10 hold - Seated Abdominal Press into [employer]  - 1 x daily - 7 x weekly - 2 sets - 10 reps - 3-4 hold - Sit to Stand with Armchair  - 1 x daily - 7 x weekly - 1 sets - 10 reps - Seated Piriformis Stretch  - 1 x daily - 7 x weekly - 2 sets - 20 hold - Supine Lower Trunk Rotation  - 1 x daily - 7 x weekly - 1 sets - 8-10 reps - 5 hold - Supine Transversus Abdominis Bracing - Hands on Stomach  - 1 x daily - 7 x weekly - 2 sets - 10 reps - 3-4 hold - Seated Hip Adductor Stretch  - 1 x daily - 7 x weekly - 2 sets - 20 hold  ASSESSMENT:  CLINICAL IMPRESSION: Akshara verbalized feeling no different since starting physical therapy. She feels back pain does not go below 6/10. Incorporated manual spinal decompression exercises today to decrease load on spine and provide gentle distraction. Patient verbalized some relief after technique. Discussed minimal progress made with physical therapy and possibly scheduling a follow up appointment with Dr. Cleora Daft to discuss other options. Patient was in agreement with this. She feels better after treatment sessions but when she gets home she has more back pain.  Educated patient on energy conservation techniques. Patient will benefit from skilled PT to address the below impairments and improve overall function.     OBJECTIVE IMPAIRMENTS: Abnormal gait, decreased balance, decreased endurance, decreased mobility, decreased ROM, decreased strength, dizziness, increased muscle spasms, impaired flexibility, impaired sensation, postural dysfunction, and pain.    ACTIVITY LIMITATIONS: lifting, bending, sitting, standing, stairs, transfers, and locomotion level  PARTICIPATION LIMITATIONS: meal prep, cleaning, interpersonal relationship, shopping, and community activity  PERSONAL FACTORS: Age, Fitness, Time since onset of injury/illness/exacerbation, and 3+ comorbidities: depression; HTN; pacemaker; neuropathy are also affecting patient's functional outcome.   REHAB POTENTIAL: Good  CLINICAL DECISION MAKING: Evolving/moderate complexity  EVALUATION COMPLEXITY: High   GOALS: Goals reviewed with patient? Yes  SHORT TERM GOALS: Target date: 09/03/2023  Patient will be independent with initial HEP. Baseline:  Goal status: INITIAL  2.  Patient will report > or = to 30% improvement in back pain since starting PT. Baseline:  Goal status: INITIAL  3.  Patient will demonstrate control when performing sit to stand. Baseline:  Goal status: INITIAL   LONG TERM GOALS: Target date: 10/01/2023  Patient will demonstrate independence in advanced HEP. Baseline:  Goal status: INITIAL  2.  Patient will report > or = to 50% improvement in back pain since starting PT. Baseline:  Goal status: INITIAL  3.  Patient will verbalize and demonstrate self-care strategies to manage pain including tissue mobility practices and change of position. Baseline:  Goal status: INITIAL  4.  Patient will score < or = to 17sec on 5STS to maintain independence with transfers.  Baseline: 21.54 sec Goal status: INITIAL  5.  Patient will score < or = to 15 sec on TUG to decrease risk of falls. Baseline: 18.18 sec Goal status: INITIAL  6.  Patient will score < or = to 20/50 on ODI to decrease self perceived disability. Baseline: 26/50 52% Goal status: INITIAL  PLAN:  PT FREQUENCY: 1x/week  PT DURATION: 8 weeks  PLANNED INTERVENTIONS: 97164- PT Re-evaluation, 97110-Therapeutic exercises, 97530- Therapeutic activity, 97112- Neuromuscular re-education, 97535-  Self Care, 65784- Manual therapy, [id number]- Gait training, [phone]- Canalith repositioning, [id number]- Aquatic Therapy, 97016- Vasopneumatic device, [id number]- Ultrasound, [id number]- Traction (mechanical), [id number]- Ionotophoresis 4mg /ml Dexamethasone , Patient/Family education, Balance training, Stair training, Taping, Dry Needling, Joint mobilization, Joint manipulation, Spinal manipulation, Spinal mobilization, Vestibular training, Cryotherapy, and Moist heat.  PLAN FOR NEXT SESSION: assess tolerance to treatment session; continue core & functional strengthening; balance  Penelope Bowie, PT 09/18/23 3:57 PM [facility] [address] [place], [place] [address] Phone # [phone] Fax [phone]

## 2023-09-19 NOTE — Progress Notes (Signed)
 Remote ICD transmission.

## 2023-09-25 ENCOUNTER — Ambulatory Visit: Admitting: Physical Therapy

## 2023-09-25 ENCOUNTER — Encounter: Payer: Self-pay | Admitting: Physical Therapy

## 2023-09-25 DIAGNOSIS — M5459 Other low back pain: Secondary | ICD-10-CM | POA: Diagnosis not present

## 2023-09-25 DIAGNOSIS — R252 Cramp and spasm: Secondary | ICD-10-CM

## 2023-09-25 DIAGNOSIS — M6281 Muscle weakness (generalized): Secondary | ICD-10-CM

## 2023-09-25 DIAGNOSIS — R262 Difficulty in walking, not elsewhere classified: Secondary | ICD-10-CM

## 2023-09-25 DIAGNOSIS — R293 Abnormal posture: Secondary | ICD-10-CM

## 2023-09-25 NOTE — Telephone Encounter (Signed)
Message routed to PCP Eubanks, Jessica K, NP  

## 2023-09-25 NOTE — Therapy (Signed)
 OUTPATIENT PHYSICAL THERAPY THORACOLUMBAR TREATMENT   Patient Name: [name] MRN: [id number] DOB:[birth year], 89 y.o., female Today's Date: 09/25/2023  END OF SESSION:  PT End of Session - 09/25/23 1504     Visit Number 7    Date for PT Re-Evaluation 10/01/23    Authorization Type [employer] approved 8 visits 08/06/23-10/01/23 [id number]    Authorization Time Period 08/06/23-10/01/23    Authorization - Visit Number 6    Authorization - Number of Visits 8    Progress Note Due on Visit 10    PT Start Time 1402    PT Stop Time 1455    PT Time Calculation (min) 53 min    Activity Tolerance Patient tolerated treatment well    Behavior During Therapy [facility] for tasks assessed/performed                Past Medical History:  Diagnosis Date   Arthritis    Cancer (HCC)    left breast cancer    Cataracts, bilateral    removed by surgery   CHF (congestive heart failure) (HCC)    PACEMAKER & DEFIB   Complication of anesthesia    hypotensive after back surgery in 2006   Depression    Dyslipidemia    Fainted 04/21/06   AT [facility]   GERD (gastroesophageal reflux disease)    Headache(784.0)    Hearing loss    bilateral hearing aids   HLD (hyperlipidemia)    diet controlled    Hypertension    Hypothyroidism    ICD (implantable cardiac defibrillator) in place    pt has pacer/icd   ICD (implantable cardiac defibrillator), biventricular, in situ    LBBB (left bundle branch block)    Memory loss    Nonischemic cardiomyopathy (HCC)    Normal coronary arteries    s/p cardiac cath 2007   Pacemaker    ICD [employer]   Syncope    Systolic CHF [facility])    Vertigo    Wears glasses    Past Surgical History:  Procedure Laterality Date   BACK SURGERY     lumbar fusion    BIV ICD GENERATOR CHANGEOUT N/A 02/06/2023   Procedure: BIV ICD GENERATOR CHANGEOUT;  Surgeon: Tammie Fall, MD;  Location: [facility] INVASIVE CV LAB;  Service: Cardiovascular;  Laterality: N/A;   BREAST  LUMPECTOMY Left 2008   BREAST SURGERY  2000   LUMP REMOVAL. STAGE 1 CANCER   CARDIAC CATHETERIZATION     CATARACT EXTRACTION     COLONOSCOPY     EYE SURGERY     IMPLANTABLE CARDIOVERTER DEFIBRILLATOR GENERATOR CHANGE N/A 12/18/2012   Procedure: IMPLANTABLE CARDIOVERTER DEFIBRILLATOR GENERATOR CHANGE;  Surgeon: Tammie Fall, MD;  Location[facility] CATH LAB;  Service: Cardiovascular;  Laterality: N/A;   IR INJECT/THERA/INC NEEDLE/CATH/PLC EPI/LUMB/SAC W/IMG  06/28/2023   JOINT REPLACEMENT  06/14/01   right   LUMBAR FUSION  2006   MASS EXCISION  11/08/2011   Procedure: EXCISION MASS;  Surgeon: Lockie Rima, MD;  Location: [facility] ORS;  Service: General;  Laterality: Left;  Excision Left Thigh Mass   MASTECTOMY W/ SENTINEL NODE BIOPSY Left 06/04/2019   Procedure: LEFT MASTECTOMY WITH SENTINEL LYMPH NODE BIOPSY;  Surgeon: Lockie Rima, MD;  Location: [facility] OR;  Service: General;  Laterality: Left;   MASTECTOMY, PARTIAL  2008   GOT PACEMAKER AND DEFIB AT THAT TIME   PACEMAKER INSERTION  04/23/06   TOTAL KNEE ARTHROPLASTY  05/17/01   RIGHT KNEE   TOTAL KNEE  ARTHROPLASTY Left 11/29/2014   Procedure: TOTAL LEFT KNEE ARTHROPLASTY;  Surgeon: Claiborne Crew, MD;  Location: [facility] ORS;  Service: Orthopedics;  Laterality: Left;   Patient Active Problem List   Diagnosis Date Noted   Spinal stenosis of lumbar region with neurogenic claudication 08/28/2023   Nerve pain due to spinal stenosis 08/28/2023   Piriformis syndrome of both sides 01/30/2023   Neuropathy 08/01/2022   Atherosclerosis of native artery of both lower extremities with rest pain (HCC) 07/09/2022   Interstitial pulmonary disease (HCC) 07/09/2022   Sciatica associated with disorder of multiple sites of spine 03/20/2021   Bipolar II disorder (HCC) 03/20/2021   Chronic pain syndrome 03/20/2021   Urinary retention 02/04/2020   Seasonal allergies 02/04/2020   Recurrent major depressive disorder, in partial remission (HCC) 07/17/2019   Status post left  mastectomy 07/01/2019   Breast cancer of lower-outer quadrant of left female breast (HCC) 06/04/2019   Candida infection, oral 01/15/2019   Senile purpura (HCC) 04/14/2018   Lumbar post-laminectomy syndrome 12/03/2017   Lumbar spondylosis 12/03/2017   History of back surgery 09/01/2017   Constipation 09/01/2017   Hypothyroidism due to acquired atrophy of thyroid 09/01/2017   Mixed hyperlipidemia 09/01/2017   Age-related osteoporosis without current pathological fracture 09/01/2017   High risk medication use 09/01/2017   Arthritis of hand 05/17/2017   Atherosclerosis of native arteries of extremity with intermittent claudication (HCC) 05/15/2017   Status post total bilateral knee replacement 12/20/2016   Gait abnormality 07/16/2016   Chronic low back pain 07/16/2016   Mild cognitive impairment 07/16/2016   Wrist pain 05/10/2015   S/P left TKA 11/29/2014   S/P knee replacement 11/29/2014   Spontaneous bruising 08/27/2014   Numbness and tingling in right hand 07/28/2014   Essential tremor 07/28/2014   Dizziness 05/28/2014   Shaky 05/28/2014   Memory loss 05/28/2014   Depression 05/06/2014   Lipoma of left upper thigh 3x5 cm 09/28/2011   Cerebral vascular accident (HCC) 08/09/2011   Syncope 08/09/2011   History of breast cancer [id number], s/p BCT 2008, triple negative 01/26/2011   Chronic L breast pain with chronic recurrent seroma, s/p excisional biopsy 01/12/2010 01/26/2011   Fainted    ICD (implantable cardioverter-defibrillator), biventricular, in situ 08/02/2010   Chronic systolic heart failure (HCC) 08/02/2010   Essential hypertension 08/02/2010    PCP: [facility], Linnell Richardson, NP  REFERRING PROVIDER: Ulysees Gander, DO  REFERRING DIAG:  Diagnosis  M54.42,M54.41,G89.29 (ICD-10-CM) - Chronic bilateral low back pain with bilateral sciatica  R29.898 (ICD-10-CM) - Right leg weakness  R29.898 (ICD-10-CM) - Left leg weakness  M51.362 (ICD-10-CM) - Degeneration of  intervertebral disc of lumbar region with discogenic back pain and lower extremity pain  Z98.1 (ICD-10-CM) - History of lumbar fusion  Z88.8 (ICD-10-CM) - Allergy to iodine   G89.29,M54.41 (ICD-10-CM) - Chronic bilateral low back pain with right-sided sciatic    Rationale for Evaluation and Treatment: Rehabilitation  THERAPY DIAG:  Other low back pain  Muscle weakness (generalized)  Abnormal posture  Difficulty in walking, not elsewhere classified  Cramp and spasm  ONSET DATE: Chronic but recent flare up November 2024  SUBJECTIVE:  SUBJECTIVE STATEMENT: Patient reports she had increased pain after last treatment session. Pain did not subside until today. Pain is 7/10 currently.  From Eval: Patient presents with back pain that travels down both legs but it hurts more her right leg. Leg pain is more recent it began November 2024. When pain travels down legs she has increased muscle spasms and it feels like a squeezing sensation. Her sitting tolerance is limited to 30 minutes to an hour and she is not able to bend to pick up objects. Patient has occasional numbness and tingling in her legs when she is walking. When she sits down the pain takes a while to go away.  Patient's daughter Bartholomew Light is present at eval  PERTINENT HISTORY:  Hx lumbar fusion; Hx Left breast cancer; Left breast lumpectomy; depression, pacemaker; HTN; Vertigo; Hx bilateral TKA; neuropathy   PAIN:  Are you having pain? Yes: NPRS scale: 6(currently) 7-8(worst)/10 Pain location: both sides of lumbar side + bilateral legs Pain description: achy Aggravating factors: Bending; Sitting > 30 min - an hour Relieving factors: Nothing  PRECAUTIONS: None  RED FLAGS: None   WEIGHT BEARING RESTRICTIONS: No  FALLS:  Has patient fallen in  last 6 months? No Sometimes patient feels unsteady when she is walking and she would like to address this with physical therapy  LIVING ENVIRONMENT: Lives with: lives with their daughter Lives in: House/apartment Stairs: No Has following equipment at home: Counselling psychologist, shower chair, Grab bars, and Bedrails  OCCUPATION: Retired  PLOF: Independent with basic ADLs and Independent with household mobility with device  PATIENT GOALS: To get rid of back pain  NEXT MD VISIT: PRN  OBJECTIVE:  Note: Objective measures were completed at Evaluation unless otherwise noted.   PATIENT SURVEYS:  Modified Oswestry 26/50 52%   COGNITION: Overall cognitive status: Within functional limits for tasks assessed     SENSATION: Occasional numbness and tingling in legs  MUSCLE LENGTH: Hamstrings: decreased bilateral    POSTURE: rounded shoulders, forward head, and increased thoracic kyphosis    LUMBAR ROM: all motions limited and painful.    LOWER EXTREMITY ROM:   PROM WFL; pain with Rt hip abduction PROM    LOWER EXTREMITY MMT:  * pain in back  MMT Right eval Left eval  Hip flexion 4-* 4-  Hip extension    Hip abduction 4- 4-  Hip adduction 4- 4-  Hip internal rotation    Hip external rotation    Knee flexion 4-* 4-  Knee extension 4- 4-  Ankle dorsiflexion    Ankle plantarflexion    Ankle inversion    Ankle eversion     (Blank rows = not tested)    FUNCTIONAL TESTS:  5 times sit to stand: 21.54 sec attempted no UE first stand but patient fell back. Used UE support for remaining stands Timed up and go (TUG): 18.18 sec with SBQC  GAIT: Assistive device utilized: Quad cane small base Level of assistance: SBA Comments: decreased cadence; decreased step length bilateral ; flexed trunk  TREATMENT DATE:  09/25/2023 NuStep Level 3 7 mins- PT present to discuss status Hamstring stretch 2 x 30 sec bilateral  Seated 3 way stability ball stretch x 8 each direction 5  sec hold Supine glute stretch 2 x 30 sec bilateral  Open Books x 10 LTR with knees on red stability ball x 10 Standing lumbar extension against wall (patient had increased pain with this) Tried back against wall + sidebend - uncomfortable Seated  ball press x 20 Sit to stand holding 4# DB x 10 Ice to lumbar spine at end of treatment session x 10 mins    09/18/2023 NuStep Level 4 7 mins- PT present to discuss status Seated 3 way stability ball stretch x 8 each direction 5 sec hold Manual spinal decompression: Patient's feet on red stability ball and PT distracting at hips using a gait belt Supine LTR with feet on red stability ball x 10 each direction 5 sec hold Alt hand and knee press with red stability ball x 10 each direction Supine glute stretch 2 x 30 sec bilateral  TFL stretch with green strap 2 x 30 sec bilateral  Hooklying hip abduction with yellow loop 2 x 10 Manual: Addaday to lumbar paraspinals & gluteals for improved blood flow and muscle elongation. PT present to monitor status.    09/11/2023 NuStep Level 4 6 mins- PT present to discuss status Seated 3 way stability ball stretch x 8 each direction 5 sec hold Supine LTR x 8 each direction 5 sec hold Hooklying alt hand and knee press with beach ball in between x 10 each TA activation + SLR x 10 bilateral  Hooklying hip abduction with yellow loop 2 x 10 Supine hip flexion stretch 2 x 30 sec bilateral  Seated hamstring stretch 2 x 30 bilateral  Cone tap (two cones in front) x 8 total ; no UE support last 4 reps) Airex Step ups x 10 bilateral (no UE support last 5 session) Manual: Addaday to lumbar paraspinals for improved blood flow and muscle elongation. PT present to monitor status.    PATIENT EDUCATION:  Education details: POC; hydration; logroll technique Person educated: Patient and Child(ren) Education method: Explanation, Demonstration, and Handouts Education comprehension: verbalized understanding, returned  demonstration, and needs further education  HOME EXERCISE PROGRAM: Access Code: [email or web address] URL: [email or web address] Date: 08/27/2023 Prepared by: Penelope Bowie  Exercises - Seated Hamstring Stretch  - 1 x daily - 7 x weekly - 2 sets - 20-30 hold - Seated Quadratus Lumborum Stretch in Chair  - 1 x daily - 7 x weekly - 2 sets - 5-10 hold - Seated Abdominal Press into [employer]  - 1 x daily - 7 x weekly - 2 sets - 10 reps - 3-4 hold - Sit to Stand with Armchair  - 1 x daily - 7 x weekly - 1 sets - 10 reps - Seated Piriformis Stretch  - 1 x daily - 7 x weekly - 2 sets - 20 hold - Supine Lower Trunk Rotation  - 1 x daily - 7 x weekly - 1 sets - 8-10 reps - 5 hold - Supine Transversus Abdominis Bracing - Hands on Stomach  - 1 x daily - 7 x weekly - 2 sets - 10 reps - 3-4 hold - Seated Hip Adductor Stretch  - 1 x daily - 7 x weekly - 2 sets - 20 hold  ASSESSMENT:  CLINICAL IMPRESSION: Kima verbalized that she had increased back pain after last treatment session that lasted into today. Last session we did a trial of manual lumbar decompression. For today's session focused on lumbar and hip mobility. Introduced gentle lumbar extension exercises at wall to try to centralize symptoms and patient had increased pain. Discussed minimal progress made with therapy and the benefit of patient returning to the doctors. Ms. Demaris verbalized feeling the same compared to when she started therapy. Suggested to patient the benefit of returning to the doctor, but she does not  feel they can do anything to help her. Patient verbalized some pain relief after cryotherapy at end of session. Educated patient on safe use of ice at home.  OBJECTIVE IMPAIRMENTS: Abnormal gait, decreased balance, decreased endurance, decreased mobility, decreased ROM, decreased strength, dizziness, increased muscle spasms, impaired flexibility, impaired sensation, postural dysfunction, and pain.   ACTIVITY LIMITATIONS:  lifting, bending, sitting, standing, stairs, transfers, and locomotion level  PARTICIPATION LIMITATIONS: meal prep, cleaning, interpersonal relationship, shopping, and community activity  PERSONAL FACTORS: Age, Fitness, Time since onset of injury/illness/exacerbation, and 3+ comorbidities: depression; HTN; pacemaker; neuropathy are also affecting patient's functional outcome.   REHAB POTENTIAL: Good  CLINICAL DECISION MAKING: Evolving/moderate complexity  EVALUATION COMPLEXITY: High   GOALS: Goals reviewed with patient? Yes  SHORT TERM GOALS: Target date: 09/03/2023  Patient will be independent with initial HEP. Baseline:  Goal status: INITIAL  2.  Patient will report > or = to 30% improvement in back pain since starting PT. Baseline:  Goal status: INITIAL  3.  Patient will demonstrate control when performing sit to stand. Baseline:  Goal status: INITIAL   LONG TERM GOALS: Target date: 10/01/2023  Patient will demonstrate independence in advanced HEP. Baseline:  Goal status: INITIAL  2.  Patient will report > or = to 50% improvement in back pain since starting PT. Baseline:  Goal status: INITIAL  3.  Patient will verbalize and demonstrate self-care strategies to manage pain including tissue mobility practices and change of position. Baseline:  Goal status: INITIAL  4.  Patient will score < or = to 17sec on 5STS to maintain independence with transfers.  Baseline: 21.54 sec Goal status: INITIAL  5.  Patient will score < or = to 15 sec on TUG to decrease risk of falls. Baseline: 18.18 sec Goal status: INITIAL  6.  Patient will score < or = to 20/50 on ODI to decrease self perceived disability. Baseline: 26/50 52% Goal status: INITIAL  PLAN:  PT FREQUENCY: 1x/week  PT DURATION: 8 weeks  PLANNED INTERVENTIONS: 97164- PT Re-evaluation, 97110-Therapeutic exercises, 97530- Therapeutic activity, 97112- Neuromuscular re-education, 97535- Self Care, 16109- Manual  therapy, [id number]- Gait training, [phone]- Canalith repositioning, [id number]- Aquatic Therapy, 97016- Vasopneumatic device, [id number]- Ultrasound, [id number]- Traction (mechanical), [id number]- Ionotophoresis 4mg /ml Dexamethasone , Patient/Family education, Balance training, Stair training, Taping, Dry Needling, Joint mobilization, Joint manipulation, Spinal manipulation, Spinal mobilization, Vestibular training, Cryotherapy, and Moist heat.  PLAN FOR NEXT SESSION: assess pain levels d/c or recert?; continue core & functional strengthening; balance  Penelope Bowie, PT 09/25/23 3:08 PM [facility] [address], [address] [place], [place] [address] Phone # [phone] Fax [phone]

## 2023-09-26 ENCOUNTER — Encounter: Payer: Self-pay | Admitting: Obstetrics and Gynecology

## 2023-09-26 ENCOUNTER — Ambulatory Visit: Admitting: Obstetrics and Gynecology

## 2023-09-26 VITALS — BP 134/69 | HR 67

## 2023-09-26 DIAGNOSIS — N3281 Overactive bladder: Secondary | ICD-10-CM | POA: Diagnosis not present

## 2023-09-26 LAB — POCT URINALYSIS DIP (CLINITEK)
Bilirubin, UA: NEGATIVE
Blood, UA: NEGATIVE
Glucose, UA: NEGATIVE mg/dL
Ketones, POC UA: NEGATIVE mg/dL
Leukocytes, UA: NEGATIVE
Nitrite, UA: NEGATIVE
POC PROTEIN,UA: NEGATIVE
Spec Grav, UA: 1.01 (ref 1.010–1.025)
Urobilinogen, UA: 0.2 U/dL
pH, UA: 6.5 (ref 5.0–8.0)

## 2023-09-26 MED ORDER — LIDOCAINE HCL 2 % IJ SOLN
50.0000 mL | Freq: Once | INTRAMUSCULAR | Status: AC
  Administered 2023-09-26: 1000 mg

## 2023-09-26 MED ORDER — LIDOCAINE HCL URETHRAL/MUCOSAL 2 % EX GEL
1.0000 | Freq: Once | CUTANEOUS | Status: AC
  Administered 2023-09-26: 1 via URETHRAL

## 2023-09-26 MED ORDER — CIPROFLOXACIN HCL 500 MG PO TABS
500.0000 mg | ORAL_TABLET | Freq: Once | ORAL | Status: AC
  Administered 2023-09-26: 500 mg via ORAL

## 2023-09-26 MED ORDER — ONABOTULINUMTOXINA 100 UNITS IJ SOLR
100.0000 [IU] | Freq: Once | INTRAMUSCULAR | Status: AC
  Administered 2023-09-26: 100 [IU] via INTRAMUSCULAR

## 2023-09-26 NOTE — Progress Notes (Signed)
 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   Vitals:   09/26/23 0931  BP: 134/69  Pulse: 67    No results found. However, due to the size of the patient record, not all encounters were searched. Please check Results Review for a complete set of results.   Prior to the procedure, the patient took ciprofloxacin  for antibiotic prophylaxis. Time out was performed. The bladder was catherized and 50 ml of 2% lidocaine  was placed in the bladder and 10 ml of 2% lidocaine  jelly placed in the urethra. After 30 minutes the lidocaine  was drained. Cystoscopy was performed with sterile H2O and a 70 degree scope. Bladder mucosa was noted to be within normal limits. A total of 10 ml / 100 units of Botox A,  Lot # [id number] Exp 08/2025  was injected in the detrusor muscle via 5 injections of 2ml each. These were spaced about 1 cm apart. Care was taken to avoid the ureteral orifices and the trigone. Patient tolerated the procedure well.  Impression: 89 y.o. 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 Patient tolerated procedure well.  Plan: She voided after the procedure.  Post-procedure instructions given regarding bleeding, infection, urinary retention.  Self-catheterization teaching was previously performed.   Patient will follow up in 4 weeks All questions answered.  Arma Lamp, MD

## 2023-09-26 NOTE — Addendum Note (Signed)
 Addended by: Etai Copado N on: 09/26/2023 02:18 PM   Modules accepted: Orders

## 2023-09-26 NOTE — Addendum Note (Signed)
 Addended by: Arma Lamp on: 09/26/2023 12:12 PM   Modules accepted: Orders

## 2023-09-26 NOTE — Patient Instructions (Signed)
Taking Care of Yourself after Urodynamics, Cystoscopy, Bulkamid Injection, or Botox Injection   Drink plenty of water for a day or two following your procedure. Try to have about 8 ounces (one cup) at a time, and do this 6 times or more per day unless you have fluid restrictitons AVOID irritative beverages such as coffee, tea, soda, alcoholic or citrus drinks for a day or two, as this may cause burning with urination.  For the first 1-2 days after the procedure, your urine may be pink or red in color. You may have some blood in your urine as a normal side effect of the procedure. Large amounts of bleeding or difficulty urinating are NOT normal. Call the nurse line if this happens or go to the nearest Emergency Room if the bleeding is heavy or you cannot urinate at all and it is after hours.  You may experience some discomfort or a burning sensation with urination after having this procedure. You can use over the counter Azo or pyridium to help with burning and follow the instructions on the packaging. If it does not improve within 1-2 days, or other symptoms appear (fever, chills, or difficulty urinating) call the office to speak to a nurse.  You may return to normal daily activities such as work, school, driving, exercising and housework on the day of the procedure.

## 2023-09-30 ENCOUNTER — Ambulatory Visit (INDEPENDENT_AMBULATORY_CARE_PROVIDER_SITE_OTHER): Admitting: Adult Health

## 2023-09-30 ENCOUNTER — Ambulatory Visit: Payer: Self-pay | Admitting: *Deleted

## 2023-09-30 VITALS — BP 110/78 | HR 68 | Temp 97.3°F | Resp 18 | Ht <= 58 in | Wt 119.4 lb

## 2023-09-30 DIAGNOSIS — R300 Dysuria: Secondary | ICD-10-CM | POA: Diagnosis not present

## 2023-09-30 DIAGNOSIS — K5904 Chronic idiopathic constipation: Secondary | ICD-10-CM

## 2023-09-30 DIAGNOSIS — R350 Frequency of micturition: Secondary | ICD-10-CM

## 2023-09-30 DIAGNOSIS — I10 Essential (primary) hypertension: Secondary | ICD-10-CM

## 2023-09-30 DIAGNOSIS — M810 Age-related osteoporosis without current pathological fracture: Secondary | ICD-10-CM | POA: Diagnosis not present

## 2023-09-30 DIAGNOSIS — F3341 Major depressive disorder, recurrent, in partial remission: Secondary | ICD-10-CM

## 2023-09-30 LAB — POCT URINALYSIS DIPSTICK (MANUAL)
Nitrite, UA: NEGATIVE
Poct Bilirubin: NEGATIVE
Poct Blood: NEGATIVE
Poct Glucose: NORMAL mg/dL
Poct Ketones: NEGATIVE
Poct Protein: NEGATIVE mg/dL
Poct Urobilinogen: NORMAL mg/dL
Spec Grav, UA: <=1.005 — AB (ref 1.010–1.025)
pH, UA: 7.5 (ref 5.0–8.0)

## 2023-09-30 NOTE — Telephone Encounter (Signed)
 FYI Only or Action Required?: FYI only for provider.  Patient was last seen in primary care on 06/10/2023 by [name], NP. Called Nurse Triage reporting Dysuria. Symptoms began yesterday. Interventions attempted: Nothing. Symptoms are: gradually worsening.  Triage Disposition: See Physician Within 24 Hours  Patient/caregiver understands and will follow disposition? Yes  Reason for Disposition  Urinating more frequently than usual (i.e., frequency)  Answer Assessment - Initial Assessment Questions 1. SYMPTOM: What's the main symptom you're concerned about? (e.g., frequency, incontinence)     Frequency, burning after urine, lower abdominal pain 2. ONSET: When did the  symptoms  start?     yesterday 3. PAIN: Is there any pain? If Yes, ask: How bad is it? (Scale: 1-10; mild, moderate, severe)     6/10 4. CAUSE: What do you think is causing the symptoms?     UTI- not frequently 5. OTHER SYMPTOMS: Do you have any other symptoms? (e.g., blood in urine, fever, flank pain, pain with urination)     Pain with urination  Protocols used: Urinary Symptoms-A-AH      : Copied from CRM #[id number]. Topic: Clinical - Red Word Triage >> Sep 30, 2023  8:04 AM Laurier BROCKS wrote: Red Word that prompted transfer to Nurse Triage: Patient's daughter [name] believes her mother has a UTI. The symptoms started on or around Thursday. Patient has been going to the restroom more frequently, pain after urination.

## 2023-09-30 NOTE — Telephone Encounter (Signed)
 Appointment scheduled for 6/23 for evaluation.

## 2023-09-30 NOTE — Progress Notes (Unsigned)
 [facility] clinic  Provider:  Jereld Serum DNP  Code Status:  Full Code  Goals of Care:     08/06/2023    8:50 AM  Advanced Directives  Does Patient Have a Medical Advance Directive? Yes  Type of Estate agent of [place];Living will  Does patient want to make changes to medical advance directive? Yes (ED - Information included in AVS)     Chief Complaint  Patient presents with  . Urinary Tract Infection    urinary symptoms, Please see notes from triage Nurse. ( Order for a Urine Dip is pending)    HPI: Patient is a 89 y.o. female seen today for an acute visit for Discussed the use of AI scribe software for clinical note transcription with the patient, who gave verbal consent to proceed.  Uses cane when walking.  Past Medical History:  Diagnosis Date  . Arthritis   . Cancer [facility])    left breast cancer   . Cataracts, bilateral    removed by surgery  . CHF (congestive heart failure) (HCC)    PACEMAKER & DEFIB  . Complication of anesthesia    hypotensive after back surgery in 2006  . Depression   . Dyslipidemia   . Fainted 04/21/06   AT [facility]  . GERD (gastroesophageal reflux disease)   . Headache(784.0)   . Hearing loss    bilateral hearing aids  . HLD (hyperlipidemia)    diet controlled   . Hypertension   . Hypothyroidism   . ICD (implantable cardiac defibrillator) in place    pt has pacer/icd  . ICD (implantable cardiac defibrillator), biventricular, in situ   . LBBB (left bundle branch block)   . Memory loss   . Nonischemic cardiomyopathy (HCC)   . Normal coronary arteries    s/p cardiac cath 2007  . Pacemaker    ICD [employer]  . Syncope   . Systolic CHF (HCC)   . Vertigo   . Wears glasses     Past Surgical History:  Procedure Laterality Date  . BACK SURGERY     lumbar fusion   . BIV ICD GENERATOR CHANGEOUT N/A 02/06/2023   Procedure: BIV ICD GENERATOR CHANGEOUT;  Surgeon: [name], MD;  Location: [facility]  INVASIVE CV LAB;  Service: Cardiovascular;  Laterality: N/A;  . BREAST LUMPECTOMY Left 2008  . BREAST SURGERY  2000   LUMP REMOVAL. STAGE 1 CANCER  . CARDIAC CATHETERIZATION    . CATARACT EXTRACTION    . COLONOSCOPY    . EYE SURGERY    . IMPLANTABLE CARDIOVERTER DEFIBRILLATOR GENERATOR CHANGE N/A 12/18/2012   Procedure: IMPLANTABLE CARDIOVERTER DEFIBRILLATOR GENERATOR CHANGE;  Surgeon: Danelle ORN Waddell, MD;  Location[facility] CATH LAB;  Service: Cardiovascular;  Laterality: N/A;  . IR INJECT/THERA/INC NEEDLE/CATH/PLC EPI/LUMB/SAC W/IMG  06/28/2023  . JOINT REPLACEMENT  06/14/01   right  . LUMBAR FUSION  2006  . MASS EXCISION  11/08/2011   Procedure: EXCISION MASS;  Surgeon: Jina Nephew, MD;  Location: [facility] ORS;  Service: General;  Laterality: Left;  Excision Left Thigh Mass  . MASTECTOMY W/ SENTINEL NODE BIOPSY Left 06/04/2019   Procedure: LEFT MASTECTOMY WITH SENTINEL LYMPH NODE BIOPSY;  Surgeon: Nephew Jina, MD;  Location: [facility] OR;  Service: General;  Laterality: Left;  SABRA MASTECTOMY, PARTIAL  2008   GOT PACEMAKER AND DEFIB AT THAT TIME  . PACEMAKER INSERTION  04/23/06  . TOTAL KNEE ARTHROPLASTY  05/17/01   RIGHT KNEE  . TOTAL KNEE ARTHROPLASTY Left  11/29/2014   Procedure: TOTAL LEFT KNEE ARTHROPLASTY;  Surgeon: Donnice Car, MD;  Location: [facility] ORS;  Service: Orthopedics;  Laterality: Left;    Allergies  Allergen Reactions  . Iodinated Contrast Media Shortness Of Breath    Requires pre-medication  . Iodine  Shortness Of Breath    Iodine  contrast, CHF , SOB  . Shellfish Allergy Shortness Of Breath  . Memantine      Malaise, fogginess/ couldn't think  . Aspirin  Nausea And Vomiting  . Codeine Nausea And Vomiting and Other (See Comments)    Other Reaction(s): Not available    Outpatient Encounter Medications as of 09/30/2023  Medication Sig  . albuterol  (VENTOLIN  HFA) 108 (90 Base) MCG/ACT inhaler Inhale 2 puffs into the lungs every 6 (six) hours as needed for wheezing or shortness of breath.  SABRA  ascorbic acid (VITAMIN C) 500 MG tablet Take 500 mg by mouth daily.  . buPROPion  (WELLBUTRIN  XL) 150 MG 24 hr tablet Take 1 tablet (150 mg total) by mouth daily.  . carvedilol  (COREG ) 3.125 MG tablet TAKE 1 TABLET BY MOUTH TWICE  DAILY  . Cholecalciferol (VITAMIN D3) 50 MCG (2000 UT) TABS Take 2,000 Units by mouth daily.   . Clobetasol  Prop Emollient Base (CLOBETASOL  PROPIONATE E) 0.05 % emollient cream Apply 1 Application topically at bedtime.  . diclofenac  Sodium (PENNSAID ) 2 % SOLN APPLY 2 PUMPS (40MG ) UP TO TWO PAINFUL AREAS TWICE A DAY  . diphenhydrAMINE  (BENADRYL ) 50 MG tablet 50 mg oral diphenhydramine  administered one hour prior to contrast administration.  . DULoxetine (CYMBALTA) 60 MG capsule Take 60 mg by mouth daily.  . estradiol  (ESTRACE ) 0.1 MG/GM vaginal cream Place 0.5g nightly for two weeks then twice a week after  . famotidine (PEPCID) 10 MG tablet Take 10 mg by mouth daily as needed for heartburn or indigestion.  . furosemide  (LASIX ) 20 MG tablet Take 1 tablet (20 mg total) by mouth daily as needed.  . levothyroxine  (SYNTHROID ) 88 MCG tablet Take 1 tablet (88 mcg total) by mouth daily.  . lidocaine  (XYLOCAINE ) 5 % ointment Apply 1 Application topically as needed (up to 4x/day as needed).  . Lidocaine  4 % PTCH Apply 1 patch topically every 12 (twelve) hours. Apply to back  . lidocaine -prilocaine  (EMLA ) cream Apply 1 Application topically as needed.  . lubiprostone  (AMITIZA ) 8 MCG capsule TAKE ONE CAPSULE BY MOUTH TWICE DAILY WITH A MEAL  . Magnesium  250 MG TABS Take 250 mg by mouth daily.  . metaxalone  (SKELAXIN ) 800 MG tablet Take 0.5 tablets (400 mg total) by mouth 3 (three) times daily as needed for muscle spasms.  . Misc Natural Products (PUMPKIN SEED  OIL) CAPS Take 1 capsule by mouth daily.  . NALTREXONE  HCL, PAIN, PO Take 4 mg by mouth at bedtime.  . NONFORMULARY OR COMPOUNDED ITEM Apply 120 Tubes topically daily. Diclofenac /Cyclobenzaprine/lamotrigine/lidocaine /prilocaine   (10480) 2%/2%/6%/5%/1.25% cream QTY: 120 GM SIG: (NEURO) APPLY 1-2 PUMPS (1-2 GMS) TO AFFECTED AREA (S) OR FOCAL POINTS 3 TO 4 TIMES DAILY. RUB IN FOR 2 MINUTES TO ACHIEVE MAX PENETRATION. WASH HANDS WELL.  . Omega-3 Fatty Acids (FISH OIL) 1200 MG CAPS Take 1,200 mg by mouth daily.  . omeprazole  (PRILOSEC) 40 MG capsule TAKE 1 CAPSULE BY MOUTH DAILY  . Polyethyl Glycol-Propyl Glycol (SYSTANE) 0.4-0.3 % SOLN Place 1 drop into both eyes at bedtime.  . POLYETHYLENE GLYCOL 3350  PO Take 1 Capful by mouth daily.  . pregabalin  (LYRICA ) 25 MG capsule Take 1 capsule (25 mg total) by mouth 2 (two) times  daily.  . senna-docusate (SENOKOT S) 8.6-50 MG tablet Take 2 tablets by mouth daily.  . spironolactone  (ALDACTONE ) 25 MG tablet TAKE 1 TABLET BY MOUTH IN THE  MORNING  . telmisartan  (MICARDIS ) 20 MG tablet Take 1 tablet (20 mg total) by mouth daily.  . Trospium  Chloride 60 MG CP24 Take 1 capsule (60 mg total) by mouth daily.  . Turmeric (QC TUMERIC COMPLEX PO) Take 1,000 mg by mouth daily.  . Vitamin E 400 units TABS Take 400 Units by mouth daily.   SABRA VRAYLAR 1.5 MG capsule Take 1.5 mg by mouth daily.   Facility-Administered Encounter Medications as of 09/30/2023  Medication  . [START ON 11/28/2023] denosumab  (PROLIA ) injection 60 mg    Review of Systems:  Review of Systems  Constitutional:  Negative for activity change, chills and fever.  Gastrointestinal:  Negative for abdominal distention, abdominal pain, diarrhea, nausea and vomiting.  Genitourinary:  Positive for dysuria, frequency and urgency. Negative for difficulty urinating and flank pain.    Health Maintenance  Topic Date Due  . DTaP/Tdap/Td (2 - Td or Tdap) 12/09/2023 (Originally 01/08/2023)  . COVID-19 Vaccine (8 - Pfizer risk 2024-25 season) 01/07/2024 (Originally 06/23/2023)  . INFLUENZA VACCINE  11/08/2023  . MAMMOGRAM  04/08/2024  . Medicare Annual Wellness (AWV)  05/26/2024  . Pneumococcal Vaccine: 50+ Years  Completed  . DEXA  SCAN  Completed  . Zoster Vaccines- Shingrix  Completed  . HPV VACCINES  Aged Out  . Meningococcal B Vaccine  Aged Out    Physical Exam: Vitals:   09/30/23 0918  BP: 110/78  Pulse: 68  Resp: 18  Temp: (!) 97.3 F (36.3 C)  SpO2: 96%  Weight: 119 lb 6.4 oz (54.2 kg)  Height: 4' 10 (1.473 m)   Body mass index is 24.95 kg/m. Physical Exam Constitutional:      Appearance: Normal appearance.  HENT:     Head: Normocephalic and atraumatic.     Nose: Nose normal.     Mouth/Throat:     Mouth: Mucous membranes are moist.   Eyes:     Conjunctiva/sclera: Conjunctivae normal.    Cardiovascular:     Rate and Rhythm: Normal rate and regular rhythm.     Comments: Left chest pacemaker Pulmonary:     Effort: Pulmonary effort is normal.     Breath sounds: Normal breath sounds.  Abdominal:     General: Bowel sounds are normal.     Palpations: Abdomen is soft.   Musculoskeletal:        General: Normal range of motion.     Cervical back: Normal range of motion.   Skin:    General: Skin is warm and dry.   Neurological:     General: No focal deficit present.     Mental Status: She is alert and oriented to person, place, and time.   Psychiatric:        Mood and Affect: Mood normal.        Behavior: Behavior normal.        Thought Content: Thought content normal.        Judgment: Judgment normal.    Labs reviewed: Basic Metabolic Panel: Recent Labs    01/23/23 0956 05/27/23 1110 08/19/23 0927  NA 136 134* 134*  K 4.6 4.6 4.1  CL 99 98 101  CO2 25 30 31   GLUCOSE 108* 110 108*  BUN 18 21 22   CREATININE 0.81 0.81 0.76  CALCIUM 10.3 10.1 9.8  TSH  --  0.73  --    Liver Function Tests: Recent Labs    01/16/23 1130 01/23/23 0956 05/27/23 1110 08/19/23 0927  AST 19 23 17 19   ALT 13 18 15 16   ALKPHOS 63 76  --  39  BILITOT 0.4 0.2 0.4 0.4  PROT 7.4 7.0 6.9 7.0  ALBUMIN 3.9 4.0  --  4.0   No results for input(s): LIPASE, AMYLASE in the last 8760  hours. No results for input(s): AMMONIA in the last 8760 hours. CBC: Recent Labs    01/16/23 1130 01/23/23 0956 05/27/23 1110 08/19/23 0927  WBC 6.9 9.3 7.3 9.0  NEUTROABS 4.3  --  4,665 5.9  HGB 12.0 12.5 11.8 12.4  HCT 36.3 38.9 37.0 36.5  MCV 89.4 92 89.8 87.1  PLT 154 128* 142 135*   Lipid Panel: No results for input(s): CHOL, HDL, LDLCALC, TRIG, CHOLHDL, LDLDIRECT in the last 8760 hours. No results found for: HGBA1C  Procedures since last visit: No results found.  Assessment/Plan    Labs/tests ordered:     No follow-ups on file.  Zaid Tomes Medina-Vargas, NP

## 2023-10-01 ENCOUNTER — Ambulatory Visit: Admitting: Physical Therapy

## 2023-10-01 ENCOUNTER — Telehealth: Payer: Self-pay | Admitting: *Deleted

## 2023-10-01 ENCOUNTER — Encounter: Payer: Self-pay | Admitting: Physical Therapy

## 2023-10-01 ENCOUNTER — Encounter: Payer: Self-pay | Admitting: Nurse Practitioner

## 2023-10-01 DIAGNOSIS — R252 Cramp and spasm: Secondary | ICD-10-CM

## 2023-10-01 DIAGNOSIS — M5459 Other low back pain: Secondary | ICD-10-CM

## 2023-10-01 DIAGNOSIS — M6281 Muscle weakness (generalized): Secondary | ICD-10-CM

## 2023-10-01 DIAGNOSIS — R262 Difficulty in walking, not elsewhere classified: Secondary | ICD-10-CM

## 2023-10-01 DIAGNOSIS — R293 Abnormal posture: Secondary | ICD-10-CM

## 2023-10-01 LAB — URINE CULTURE
MICRO NUMBER:: 16613991
Result:: NO GROWTH
SPECIMEN QUALITY:: ADEQUATE

## 2023-10-01 NOTE — Telephone Encounter (Signed)
 Copied from CRM [id number]. Topic: Clinical - Medication Question >> Oct 01, 2023  4:03 PM Mercer PEDLAR wrote: Reason for CRM: Patient calling to notify nurse that the new medication she is taking is Buspirone HCL 5mg  tablets. She stated that she was told at appointment on 10/01/23 to call back and provide this information to make sure it does not affect new prescription.   Patient saw Monina 6/23. Forwarded message to Monina.

## 2023-10-01 NOTE — Therapy (Signed)
 OUTPATIENT PHYSICAL THERAPY THORACOLUMBAR TREATMENT/ DISCHARGE NOTE   Patient Name: [name] MRN: [id number] DOB:July 21, 1933, 89 y.o., female Today's Date: 10/01/2023  END OF SESSION:  PT End of Session - 10/01/23 1458     Visit Number 8    Date for PT Re-Evaluation 10/01/23    Authorization Type [employer] approved 8 visits 08/06/23-10/01/23 [id number]    Authorization Time Period 08/06/23-10/01/23    Authorization - Visit Number 7    Authorization - Number of Visits 8    Progress Note Due on Visit 10    PT Start Time 1359    PT Stop Time 1455    PT Time Calculation (min) 56 min    Activity Tolerance Patient tolerated treatment well    Behavior During Therapy [facility] for tasks assessed/performed                 Past Medical History:  Diagnosis Date   Arthritis    Cancer (HCC)    left breast cancer    Cataracts, bilateral    removed by surgery   CHF (congestive heart failure) (HCC)    PACEMAKER & DEFIB   Complication of anesthesia    hypotensive after back surgery in 2006   Depression    Dyslipidemia    Fainted 04/21/06   AT [facility]   GERD (gastroesophageal reflux disease)    Headache(784.0)    Hearing loss    bilateral hearing aids   HLD (hyperlipidemia)    diet controlled    Hypertension    Hypothyroidism    ICD (implantable cardiac defibrillator) in place    pt has pacer/icd   ICD (implantable cardiac defibrillator), biventricular, in situ    LBBB (left bundle branch block)    Memory loss    Nonischemic cardiomyopathy (HCC)    Normal coronary arteries    s/p cardiac cath 2007   Pacemaker    ICD [employer]   Syncope    Systolic CHF [facility])    Vertigo    Wears glasses    Past Surgical History:  Procedure Laterality Date   BACK SURGERY     lumbar fusion    BIV ICD GENERATOR CHANGEOUT N/A 02/06/2023   Procedure: BIV ICD GENERATOR CHANGEOUT;  Surgeon: [name], MD;  Location: [facility] INVASIVE CV LAB;  Service: Cardiovascular;   Laterality: N/A;   BREAST LUMPECTOMY Left 2008   BREAST SURGERY  2000   LUMP REMOVAL. STAGE 1 CANCER   CARDIAC CATHETERIZATION     CATARACT EXTRACTION     COLONOSCOPY     EYE SURGERY     IMPLANTABLE CARDIOVERTER DEFIBRILLATOR GENERATOR CHANGE N/A 12/18/2012   Procedure: IMPLANTABLE CARDIOVERTER DEFIBRILLATOR GENERATOR CHANGE;  Surgeon: Danelle ORN Waddell, MD;  Location: [facility] CATH LAB;  Service: Cardiovascular;  Laterality: N/A;   IR INJECT/THERA/INC NEEDLE/CATH/PLC EPI/LUMB/SAC W/IMG  06/28/2023   JOINT REPLACEMENT  06/14/01   right   LUMBAR FUSION  2006   MASS EXCISION  11/08/2011   Procedure: EXCISION MASS;  Surgeon: Jina Nephew, MD;  Location: [facility] ORS;  Service: General;  Laterality: Left;  Excision Left Thigh Mass   MASTECTOMY W/ SENTINEL NODE BIOPSY Left 06/04/2019   Procedure: LEFT MASTECTOMY WITH SENTINEL LYMPH NODE BIOPSY;  Surgeon: Nephew Jina, MD;  Location: [facility] OR;  Service: General;  Laterality: Left;   MASTECTOMY, PARTIAL  2008   GOT PACEMAKER AND DEFIB AT THAT TIME   PACEMAKER INSERTION  04/23/06   TOTAL KNEE ARTHROPLASTY  05/17/01   RIGHT KNEE  TOTAL KNEE ARTHROPLASTY Left 11/29/2014   Procedure: TOTAL LEFT KNEE ARTHROPLASTY;  Surgeon: Donnice Car, MD;  Location: [facility] ORS;  Service: Orthopedics;  Laterality: Left;   Patient Active Problem List   Diagnosis Date Noted   Spinal stenosis of lumbar region with neurogenic claudication 08/28/2023   Nerve pain due to spinal stenosis 08/28/2023   Piriformis syndrome of both sides 01/30/2023   Neuropathy 08/01/2022   Atherosclerosis of native artery of both lower extremities with rest pain (HCC) 07/09/2022   Interstitial pulmonary disease (HCC) 07/09/2022   Sciatica associated with disorder of multiple sites of spine 03/20/2021   Bipolar II disorder (HCC) 03/20/2021   Chronic pain syndrome 03/20/2021   Urinary retention 02/04/2020   Seasonal allergies 02/04/2020   Recurrent major depressive disorder, in partial remission (HCC) 07/17/2019    Status post left mastectomy 07/01/2019   Breast cancer of lower-outer quadrant of left female breast (HCC) 06/04/2019   Candida infection, oral 01/15/2019   Senile purpura (HCC) 04/14/2018   Lumbar post-laminectomy syndrome 12/03/2017   Lumbar spondylosis 12/03/2017   History of back surgery 09/01/2017   Constipation 09/01/2017   Hypothyroidism due to acquired atrophy of thyroid 09/01/2017   Mixed hyperlipidemia 09/01/2017   Age-related osteoporosis without current pathological fracture 09/01/2017   High risk medication use 09/01/2017   Arthritis of hand 05/17/2017   Atherosclerosis of native arteries of extremity with intermittent claudication (HCC) 05/15/2017   Status post total bilateral knee replacement 12/20/2016   Gait abnormality 07/16/2016   Chronic low back pain 07/16/2016   Mild cognitive impairment 07/16/2016   Wrist pain 05/10/2015   S/P left TKA 11/29/2014   S/P knee replacement 11/29/2014   Spontaneous bruising 08/27/2014   Numbness and tingling in right hand 07/28/2014   Essential tremor 07/28/2014   Dizziness 05/28/2014   Shaky 05/28/2014   Memory loss 05/28/2014   Depression 05/06/2014   Lipoma of left upper thigh 3x5 cm 09/28/2011   Cerebral vascular accident (HCC) 08/09/2011   Syncope 08/09/2011   History of breast cancer [id number], s/p BCT 2008, triple negative 01/26/2011   Chronic L breast pain with chronic recurrent seroma, s/p excisional biopsy 01/12/2010 01/26/2011   Fainted    ICD (implantable cardioverter-defibrillator), biventricular, in situ 08/02/2010   Chronic systolic heart failure (HCC) 08/02/2010   Essential hypertension 08/02/2010    PCP: Caro Raisin, MARLA, NP  REFERRING PROVIDER: Leonce Katz, DO  REFERRING DIAG:  Diagnosis  M54.42,M54.41,G89.29 (ICD-10-CM) - Chronic bilateral low back pain with bilateral sciatica  R29.898 (ICD-10-CM) - Right leg weakness  R29.898 (ICD-10-CM) - Left leg weakness  M51.362 (ICD-10-CM) -  Degeneration of intervertebral disc of lumbar region with discogenic back pain and lower extremity pain  Z98.1 (ICD-10-CM) - History of lumbar fusion  Z88.8 (ICD-10-CM) - Allergy to iodine   G89.29,M54.41 (ICD-10-CM) - Chronic bilateral low back pain with right-sided sciatic    Rationale for Evaluation and Treatment: Rehabilitation  THERAPY DIAG:  Other low back pain  Muscle weakness (generalized)  Abnormal posture  Difficulty in walking, not elsewhere classified  Cramp and spasm  ONSET DATE: Chronic but recent flare up November 2024  SUBJECTIVE:  SUBJECTIVE STATEMENT: Patient reports she had increased pain after last treatment session into the evening. Today she is not as painful because she has been taking it easy.  From Eval: Patient presents with back pain that travels down both legs but it hurts more her right leg. Leg pain is more recent it began November 2024. When pain travels down legs she has increased muscle spasms and it feels like a squeezing sensation. Her sitting tolerance is limited to 30 minutes to an hour and she is not able to bend to pick up objects. Patient has occasional numbness and tingling in her legs when she is walking. When she sits down the pain takes a while to go away.  Patient's daughter Channing is present at eval  PERTINENT HISTORY:  Hx lumbar fusion; Hx Left breast cancer; Left breast lumpectomy; depression, pacemaker; HTN; Vertigo; Hx bilateral TKA; neuropathy   PAIN:  Are you having pain? Yes: NPRS scale: 6(currently) 7-8(worst)/10 Pain location: both sides of lumbar side + bilateral legs Pain description: achy Aggravating factors: Bending; Sitting > 30 min - an hour Relieving factors: Nothing  PRECAUTIONS: None  RED FLAGS: None   WEIGHT BEARING  RESTRICTIONS: No  FALLS:  Has patient fallen in last 6 months? No Sometimes patient feels unsteady when she is walking and she would like to address this with physical therapy  LIVING ENVIRONMENT: Lives with: lives with their daughter Lives in: House/apartment Stairs: No Has following equipment at home: Counselling psychologist, shower chair, Grab bars, and Bedrails  OCCUPATION: Retired  PLOF: Independent with basic ADLs and Independent with household mobility with device  PATIENT GOALS: To get rid of back pain  NEXT MD VISIT: PRN  OBJECTIVE:  Note: Objective measures were completed at Evaluation unless otherwise noted.   PATIENT SURVEYS:  Modified Oswestry 26/50 52%    COGNITION: Overall cognitive status: Within functional limits for tasks assessed     SENSATION: Occasional numbness and tingling in legs  MUSCLE LENGTH: Hamstrings: decreased bilateral    POSTURE: rounded shoulders, forward head, and increased thoracic kyphosis    LUMBAR ROM: all motions limited and painful.    LOWER EXTREMITY ROM:   PROM WFL; pain with Rt hip abduction PROM    LOWER EXTREMITY MMT:  * pain in back  MMT Right eval Left eval  Hip flexion 4-* 4-  Hip extension    Hip abduction 4- 4-  Hip adduction 4- 4-  Hip internal rotation    Hip external rotation    Knee flexion 4-* 4-  Knee extension 4- 4-  Ankle dorsiflexion    Ankle plantarflexion    Ankle inversion    Ankle eversion     (Blank rows = not tested)    FUNCTIONAL TESTS:  5 times sit to stand: 21.54 sec attempted no UE first stand but patient fell back. Used UE support for remaining stands Timed up and go (TUG): 18.18 sec with SBQC  GAIT: Assistive device utilized: Quad cane small base Level of assistance: SBA Comments: decreased cadence; decreased step length bilateral ; flexed trunk  TREATMENT DATE:  10/01/2023 NuStep Level 3 7 mins- PT present to discuss status TUG:17.16 sec with SBQC 5STS: 11.89 sec  with UE support ODI 27/50 54% Seated hamstring stretch 2 x 3 sec bilateral  Seated ball press x 20 Seated QL stretch 3 x 10 sec Seated piriformis stretch 2 x 30sec bilateral  3way stability ball stretch x 8 each direction Seated adductor stretch 2 x 20  sec bilateral Ice to lumbar spine at end of treatment session x 10 mins    09/25/2023 NuStep Level 3 7 mins- PT present to discuss status Hamstring stretch 2 x 30 sec bilateral  Seated 3 way stability ball stretch x 8 each direction 5 sec hold Supine glute stretch 2 x 30 sec bilateral  Open Books x 10 LTR with knees on red stability ball x 10 Standing lumbar extension against wall (patient had increased pain with this) Tried back against wall + sidebend - uncomfortable Seated ball press x 20 Sit to stand holding 4# DB x 10 Ice to lumbar spine at end of treatment session x 10 mins  09/18/2023 NuStep Level 4 7 mins- PT present to discuss status Seated 3 way stability ball stretch x 8 each direction 5 sec hold Manual spinal decompression: Patient's feet on red stability ball and PT distracting at hips using a gait belt Supine LTR with feet on red stability ball x 10 each direction 5 sec hold Alt hand and knee press with red stability ball x 10 each direction Supine glute stretch 2 x 30 sec bilateral  TFL stretch with green strap 2 x 30 sec bilateral  Hooklying hip abduction with yellow loop 2 x 10 Manual: Addaday to lumbar paraspinals & gluteals for improved blood flow and muscle elongation. PT present to monitor status.    PATIENT EDUCATION:  Education details: POC; hydration; logroll technique Person educated: Patient and Child(ren) Education method: Explanation, Demonstration, and Handouts Education comprehension: verbalized understanding, returned demonstration, and needs further education  HOME EXERCISE PROGRAM: Access Code: [email or web address] URL: [email or web address] Date: 08/27/2023 Prepared by: Kristeen Sar  Exercises - Seated Hamstring Stretch  - 1 x daily - 7 x weekly - 2 sets - 20-30 hold - Seated Quadratus Lumborum Stretch in Chair  - 1 x daily - 7 x weekly - 2 sets - 5-10 hold - Seated Abdominal Press into [employer]  - 1 x daily - 7 x weekly - 2 sets - 10 reps - 3-4 hold - Sit to Stand with Armchair  - 1 x daily - 7 x weekly - 1 sets - 10 reps - Seated Piriformis Stretch  - 1 x daily - 7 x weekly - 2 sets - 20 hold - Supine Lower Trunk Rotation  - 1 x daily - 7 x weekly - 1 sets - 8-10 reps - 5 hold - Supine Transversus Abdominis Bracing - Hands on Stomach  - 1 x daily - 7 x weekly - 2 sets - 10 reps - 3-4 hold - Seated Hip Adductor Stretch  - 1 x daily - 7 x weekly - 2 sets - 20 hold  ASSESSMENT:  CLINICAL IMPRESSION: Terriona verbalizes feeling the same since starting therapy. For the last 3-4 physical therapy sessions patient has verbalized increased back pain afterwards. These sessions we tried new things like: manual lumbar traction, flexion and extension based activities. Incorporated ice at the end of last session and patient verbalized some relief. Patient verbalized she feels the best when she performs her HEP, so reviewed those exercises with her to ensure proper performance. Patient to discharge home with HEP.     OBJECTIVE IMPAIRMENTS: Abnormal gait, decreased balance, decreased endurance, decreased mobility, decreased ROM, decreased strength, dizziness, increased muscle spasms, impaired flexibility, impaired sensation, postural dysfunction, and pain.   ACTIVITY LIMITATIONS: lifting, bending, sitting, standing, stairs, transfers, and locomotion level  PARTICIPATION LIMITATIONS: meal prep, cleaning, interpersonal relationship, shopping, and community activity  PERSONAL  FACTORS: Age, Fitness, Time since onset of injury/illness/exacerbation, and 3+ comorbidities: depression; HTN; pacemaker; neuropathy are also affecting patient's functional outcome.   REHAB POTENTIAL:  Good  CLINICAL DECISION MAKING: Evolving/moderate complexity  EVALUATION COMPLEXITY: High   GOALS: Goals reviewed with patient? Yes  SHORT TERM GOALS: Target date: 09/03/2023  Patient will be independent with initial HEP. Baseline:  Goal status: MET   2.  Patient will report > or = to 30% improvement in back pain since starting PT. Baseline:  Goal status: NOT MET  3.  Patient will demonstrate control when performing sit to stand. Baseline:  Goal status: MET   LONG TERM GOALS: Target date: 10/01/2023  Patient will demonstrate independence in advanced HEP. Baseline:  Goal status: MET  2.  Patient will report > or = to 50% improvement in back pain since starting PT. Baseline:  Goal status: NOT MET  3.  Patient will verbalize and demonstrate self-care strategies to manage pain including tissue mobility practices and change of position. Baseline:  Goal status: MET  4.  Patient will score < or = to 17sec on 5STS to maintain independence with transfers.  Baseline: 21.54 sec Goal status: MET   5.  Patient will score < or = to 15 sec on TUG to decrease risk of falls. Baseline: 18.18 sec Goal status:NOT MET  6.  Patient will score < or = to 20/50 on ODI to decrease self perceived disability. Baseline: 26/50 52% Goal status: NOT MET  PLAN:  PT FREQUENCY: 1x/week  PT DURATION: 8 weeks  PLANNED INTERVENTIONS: 97164- PT Re-evaluation, 97110-Therapeutic exercises, 97530- Therapeutic activity, 97112- Neuromuscular re-education, 97535- Self Care, 02859- Manual therapy, [id number]- Gait training, [phone]- Canalith repositioning, [id number]- Aquatic Therapy, 97016- Vasopneumatic device, [id number]- Ultrasound, [id number]- Traction (mechanical), [id number]- Ionotophoresis 4mg /ml Dexamethasone , Patient/Family education, Balance training, Stair training, Taping, Dry Needling, Joint mobilization, Joint manipulation, Spinal manipulation, Spinal mobilization, Vestibular training, Cryotherapy, and Moist  heat.  PLAN FOR NEXT SESSION: patient to discharge home with HEP  Kristeen Sar, PT 10/01/23 3:00 PM [facility] [address] [place], [place] [address] Phone # [phone] Fax [phone]  PHYSICAL THERAPY DISCHARGE SUMMARY  Visits from Start of Care: 8  Current functional level related to goals / functional outcomes: See above   Remaining deficits: Back Pain   Education / Equipment: See above   Patient agrees to discharge. Patient goals were partially met. Patient is being discharged due to maximized rehab potential.

## 2023-10-02 ENCOUNTER — Ambulatory Visit: Payer: Self-pay | Admitting: Adult Health

## 2023-10-02 ENCOUNTER — Other Ambulatory Visit: Payer: Self-pay | Admitting: Adult Health

## 2023-10-02 DIAGNOSIS — F419 Anxiety disorder, unspecified: Secondary | ICD-10-CM

## 2023-10-02 MED ORDER — BUSPIRONE HCL 5 MG PO TABS: 5.0000 mg | ORAL_TABLET | Freq: Two times a day (BID) | ORAL | Status: AC

## 2023-10-02 NOTE — Telephone Encounter (Signed)
 Noted

## 2023-10-02 NOTE — Progress Notes (Signed)
-    urine culture showed no growth, no UTI

## 2023-10-02 NOTE — Telephone Encounter (Signed)
 I have called and spoken to patient daughter Eternity Dexter. She stated patient is taking Buspirone 5mg  one tablet twice daily. Patient daughter stated she's taking this to help the effects of the other psych medications. She states that Psychiatrist Dr.Reddy stated that patient has been taking this medication for so long the effect has been lessened. Message routed to Jereld Delude, NP.

## 2023-10-02 NOTE — Telephone Encounter (Signed)
 Noted. Please find out how often she takes Buspirone 5 mg. Thanks.

## 2023-10-02 NOTE — Telephone Encounter (Signed)
Message routed to PCP Eubanks, Jessica K, NP  

## 2023-10-02 NOTE — Telephone Encounter (Signed)
 Addded Buspar to medication list.

## 2023-10-21 ENCOUNTER — Other Ambulatory Visit: Payer: Self-pay

## 2023-10-21 MED ORDER — DICLOFENAC SODIUM 2 % EX SOLN: 2.0000 | Freq: Two times a day (BID) | CUTANEOUS | 3 refills | Status: AC | PRN

## 2023-10-23 ENCOUNTER — Encounter: Payer: Self-pay | Admitting: Obstetrics and Gynecology

## 2023-10-23 ENCOUNTER — Ambulatory Visit (INDEPENDENT_AMBULATORY_CARE_PROVIDER_SITE_OTHER): Admitting: Obstetrics and Gynecology

## 2023-10-23 VITALS — BP 115/67 | HR 65

## 2023-10-23 DIAGNOSIS — N3281 Overactive bladder: Secondary | ICD-10-CM

## 2023-10-23 DIAGNOSIS — R339 Retention of urine, unspecified: Secondary | ICD-10-CM

## 2023-10-23 NOTE — Progress Notes (Signed)
  Urogynecology Return Visit  SUBJECTIVE  History of Present Illness: [name] is a 89 y.o. female seen in follow-up for mixed incontinence. Had intravesical botox  on 09/26/23. Also had second urethral bulking on 05/16/23.   After the first week after botox , has been needing to void every 1-2 hours. Sometimes will only get a little urine, other times it can be more normal. Leakage is about the same as previously, especially at nighttime. Daughter states she was still taking the trospium  because she felt the botox  was not working.   Past Medical History: Patient  has a past medical history of Arthritis, Cancer (HCC), Cataracts, bilateral, CHF (congestive heart failure) (HCC), Complication of anesthesia, Depression, Dyslipidemia, Fainted (04/21/06), GERD (gastroesophageal reflux disease), Headache(784.0), Hearing loss, HLD (hyperlipidemia), Hypertension, Hypothyroidism, ICD (implantable cardiac defibrillator) in place, ICD (implantable cardiac defibrillator), biventricular, in situ, LBBB (left bundle branch block), Memory loss, Nonischemic cardiomyopathy (HCC), Normal coronary arteries, Pacemaker, Syncope, Systolic CHF (HCC), Vertigo, and Wears glasses.   Past Surgical History: She  has a past surgical history that includes Total knee arthroplasty (05/17/01); Lumbar fusion (2006); Mastectomy, partial (2008); Breast surgery (2000); Joint replacement (06/14/01); Pacemaker insertion (04/23/06); Back surgery; Cardiac catheterization; Mass excision (11/08/2011); implantable cardioverter defibrillator generator change (N/A, 12/18/2012); Cataract extraction; Eye surgery; Total knee arthroplasty (Left, 11/29/2014); Breast lumpectomy (Left, 2008); Colonoscopy; Mastectomy w/ sentinel node biopsy (Left, 06/04/2019); BIV ICD GENERATOR CHANGEOUT (N/A, 02/06/2023); and IR INJECT DIAG/THERA/INC NEEDLE/CATH/PLC EPI/LUMB/SAC W/IMG (06/28/2023).   Medications: She has a current medication list which includes the  following prescription(s): albuterol , ascorbic acid, bupropion , buspirone , carvedilol , vitamin d3, clobetasol  propionate e, diclofenac  sodium, diphenhydramine , duloxetine, estradiol , famotidine, furosemide , levothyroxine , lidocaine , lidocaine , lidocaine -prilocaine , lubiprostone , magnesium , metaxalone , pumpkin seed  oil, naltrexone  hcl (pain), NONFORMULARY OR COMPOUNDED ITEM, fish oil, omeprazole , systane, polyethylene glycol 3350 , pregabalin , senna-docusate, spironolactone , telmisartan , trospium  chloride, turmeric, and vitamin e, and the following Facility-Administered Medications: [START ON 11/28/2023] denosumab .   Allergies: Patient is allergic to iodinated contrast media, iodine , shellfish allergy, memantine , aspirin , and codeine.   Social History: Patient  reports that she has never smoked. She has never been exposed to tobacco smoke. She has never used smokeless tobacco. She reports that she does not drink alcohol and does not use drugs.     OBJECTIVE     Physical Exam: Vitals:   10/23/23 1117  BP: 115/67  Pulse: 65   Gen: No apparent distress, A&O x 3.  Urethra was prepped with betadine  and straight catheter placed. PVR   ASSESSMENT AND PLAN    [name] is a 89 y.o. with:  1. Overactive bladder   2. Incomplete bladder emptying    - Advised to stop the trospium  as this can be contributing to her incomplete emptying - Pt should start self- catheterizing. Daughter is going to help her so discussed starting twice a day, in the morning and evening. Once PVR is <284ml then can stop the catheterizing. Pt was provided with cath sample and will put a request into [employer] for catheters to be sent to her house.  - Botox  will wear off over time but may take several months before she sees improvement.  - We discussed the option of potentially a lower dose of botox  in the future if she wants to try it again. But will follow up in 6 weeks to reassess symptoms. Will plan for a pessary  cleaning at next visit.    Stacey LOISE Caper, MD  Time spent: I spent 25 minutes dedicated to  the care of this patient on the date of this encounter to include pre-visit review of records, face-to-face time with the patient and post visit documentation.

## 2023-10-23 NOTE — Patient Instructions (Signed)
 Try the self catheter twice a day at least- can do more often if needed.   Once the bladder volume drained is less than then can stop catheterizing unless she is symptomatic.

## 2023-10-28 ENCOUNTER — Encounter: Payer: Self-pay | Admitting: Nurse Practitioner

## 2023-10-28 ENCOUNTER — Ambulatory Visit (INDEPENDENT_AMBULATORY_CARE_PROVIDER_SITE_OTHER): Admitting: Nurse Practitioner

## 2023-10-28 VITALS — BP 124/78 | HR 65 | Temp 97.5°F | Resp 10 | Ht <= 58 in | Wt 120.6 lb

## 2023-10-28 DIAGNOSIS — I10 Essential (primary) hypertension: Secondary | ICD-10-CM

## 2023-10-28 DIAGNOSIS — R300 Dysuria: Secondary | ICD-10-CM | POA: Diagnosis not present

## 2023-10-28 DIAGNOSIS — Z853 Personal history of malignant neoplasm of breast: Secondary | ICD-10-CM

## 2023-10-28 DIAGNOSIS — F419 Anxiety disorder, unspecified: Secondary | ICD-10-CM

## 2023-10-28 DIAGNOSIS — K219 Gastro-esophageal reflux disease without esophagitis: Secondary | ICD-10-CM

## 2023-10-28 DIAGNOSIS — F3341 Major depressive disorder, recurrent, in partial remission: Secondary | ICD-10-CM

## 2023-10-28 DIAGNOSIS — M48062 Spinal stenosis, lumbar region with neurogenic claudication: Secondary | ICD-10-CM

## 2023-10-28 DIAGNOSIS — K5904 Chronic idiopathic constipation: Secondary | ICD-10-CM

## 2023-10-28 LAB — POCT URINALYSIS DIPSTICK
Bilirubin, UA: NEGATIVE
Glucose, UA: NEGATIVE
Ketones, UA: NEGATIVE
Nitrite, UA: NEGATIVE
Protein, UA: POSITIVE — AB
Spec Grav, UA: 1.01 (ref 1.010–1.025)
Urobilinogen, UA: 0.2 U/dL
pH, UA: 6 (ref 5.0–8.0)

## 2023-10-28 NOTE — Progress Notes (Unsigned)
 Careteam: Patient Care Team: Stacey Harlene POUR, NP as PCP - General (Geriatric Medicine) [name] ORN, MD as PCP - Electrophysiology (Cardiology) Stacey Shoulders, MD as Consulting Physician (General Surgery) Stacey Cough, MD as Consulting Physician (Orthopedic Surgery) [name], OD (Optometry) Stacey Duos, MD as Consulting Physician (Neurology) [name] ORN, MD as Consulting Physician (Cardiology) Stacey Roby Masker, MD (Psychiatry) Stacey Dorn NOVAK, MD as Consulting Physician (Pulmonary Disease)  PLACE OF SERVICE:  [facility]  Advanced Directive information    Allergies  Allergen Reactions   Iodinated Contrast Media Shortness Of Breath    Requires pre-medication   Iodine  Shortness Of Breath    Iodine  contrast, CHF , SOB   Shellfish Allergy Shortness Of Breath   Memantine      Malaise, fogginess/ couldn't think   Aspirin  Nausea And Vomiting   Codeine Nausea And Vomiting and Other (See Comments)    Other Reaction(s): Not available    Chief Complaint  Patient presents with   Medical Management of Chronic Issues    4 month follow up// pt has concerns regarding botox  with urine and patient stated she has been having pain in her hip on the right side coming from her back.     HPI:  Discussed the use of AI scribe software for clinical note transcription with the patient, who gave verbal consent to proceed.  History of Present Illness [name] is an 89 year old female who presents for a four-month follow-up.  She reports recently with more issues with urinary retention and arthritis.  She experiences urinary retention following a Botox  procedure in her bladder, recommended by her urogynecologist. She catheterizes herself every morning and night due to incomplete bladder emptying. Since starting catheterization less than a week ago, she has experienced dysuria and increased urinary frequency, raising concerns about a possible urinary tract  infection. A culture taken three weeks ago showed no infection. She has a history of undergoing multiple procedures with her urogynecologist, including bulking procedures and Botox , but none have provided lasting relief. Despite the Botox  initially reducing urine volume, her symptoms have returned to baseline. She continues to experience urinary frequency and occasional incontinence, sometimes not making it to the bathroom in time. No fever, chills, or hematuria.  She reports worsening arthritis pain, particularly in her right hip, which sometimes radiates down to her feet. This pain can last for half a day or more. She is under the care of a pain clinic and sees Dr. LeBron every three to four months, with her last visit in May. She is scheduled to see a neuro spine specialist for a second opinion in August. Her current medications for pain include naltrexone  5 mg at night, Lyrica  25 mg twice daily, and a muscle relaxer, which causes fatigue.  She manages anxiety and depression with Cymbalta 50 mg daily, Buspar  5 mg twice daily, and Wellbutrin  150 mg daily.   For blood pressure, she takes carvedilol  3.125 mg twice daily and telmisartan  20 mg daily.    She uses Amitiza  and Senokot S for constipation, and omeprazole  for acid reflux. She also takes spironolactone  for fluid retention, noting occasional ankle swelling but no significant dyspnea.  She attends an adult day program at [employer]  Reports she continues follow-up with her cardiologist for pacemaker checks and her oncologist for a history of breast cancer, with annual mammograms. She is scheduled to see a rheumatologist in October for a positive ANA test to rule out rheumatoid arthritis.  Review of Systems:  Review of Systems  Constitutional:  Negative for chills, fever and weight loss.  HENT:  Negative for tinnitus.   Respiratory:  Negative for Hurley, sputum production and shortness of breath.   Cardiovascular:  Negative for chest  pain, palpitations and leg swelling.  Gastrointestinal:  Negative for abdominal pain, constipation, diarrhea and heartburn.  Genitourinary:  Positive for dysuria and frequency. Negative for urgency.  Musculoskeletal:  Positive for back pain. Negative for falls, joint pain and myalgias.  Skin: Negative.   Neurological:  Positive for sensory change. Negative for dizziness and headaches.  Psychiatric/Behavioral:  Negative for depression and memory loss. The patient does not have insomnia.     Past Medical History:  Diagnosis Date   Arthritis    Cancer (HCC)    left breast cancer    Cataracts, bilateral    removed by surgery   CHF (congestive heart failure) (HCC)    PACEMAKER & DEFIB   Complication of anesthesia    hypotensive after back surgery in 2006   Depression    Dyslipidemia    Fainted 04/21/06   AT [facility]   GERD (gastroesophageal reflux disease)    Headache(784.0)    Hearing loss    bilateral hearing aids   HLD (hyperlipidemia)    diet controlled    Hypertension    Hypothyroidism    ICD (implantable cardiac defibrillator) in place    pt has pacer/icd   ICD (implantable cardiac defibrillator), biventricular, in situ    LBBB (left bundle branch block)    Memory loss    Nonischemic cardiomyopathy (HCC)    Normal coronary arteries    s/p cardiac cath 2007   Pacemaker    ICD [employer]   Syncope    Systolic CHF [facility])    Vertigo    Wears glasses    Past Surgical History:  Procedure Laterality Date   BACK SURGERY     lumbar fusion    BIV ICD GENERATOR CHANGEOUT N/A 02/06/2023   Procedure: BIV ICD GENERATOR CHANGEOUT;  Surgeon: [name] ORN, MD;  Location: [facility] INVASIVE CV LAB;  Service: Cardiovascular;  Laterality: N/A;   BREAST LUMPECTOMY Left 2008   BREAST SURGERY  2000   LUMP REMOVAL. STAGE 1 CANCER   CARDIAC CATHETERIZATION     CATARACT EXTRACTION     COLONOSCOPY     EYE SURGERY     IMPLANTABLE CARDIOVERTER DEFIBRILLATOR GENERATOR CHANGE N/A  12/18/2012   Procedure: IMPLANTABLE CARDIOVERTER DEFIBRILLATOR GENERATOR CHANGE;  Surgeon: Hurley ORN Waddell, MD;  Location: [facility] CATH LAB;  Service: Cardiovascular;  Laterality: N/A;   IR INJECT/THERA/INC NEEDLE/CATH/PLC EPI/LUMB/SAC W/IMG  06/28/2023   JOINT REPLACEMENT  06/14/01   right   LUMBAR FUSION  2006   MASS EXCISION  11/08/2011   Procedure: EXCISION MASS;  Surgeon: Jina Nephew, MD;  Location: [facility] ORS;  Service: General;  Laterality: Left;  Excision Left Thigh Mass   MASTECTOMY W/ SENTINEL NODE BIOPSY Left 06/04/2019   Procedure: LEFT MASTECTOMY WITH SENTINEL LYMPH NODE BIOPSY;  Surgeon: Nephew Jina, MD;  Location: [facility] OR;  Service: General;  Laterality: Left;   MASTECTOMY, PARTIAL  2008   GOT PACEMAKER AND DEFIB AT THAT TIME   PACEMAKER INSERTION  04/23/06   TOTAL KNEE ARTHROPLASTY  05/17/01   RIGHT KNEE   TOTAL KNEE ARTHROPLASTY Left 11/29/2014   Procedure: TOTAL LEFT KNEE ARTHROPLASTY;  Surgeon: Donnice Car, MD;  Location: [facility];  Service: Orthopedics;  Laterality: Left;   Social History:  reports that she has never smoked. She has never been exposed to tobacco smoke. She has never used smokeless tobacco. She reports that she does not drink alcohol and does not use drugs.  Family History  Problem Relation Age of Onset   Hypertension Mother    Arthritis Mother    Hypertension Father    Hypertension Brother    Hypertension Brother     Medications: Patient's Medications  New Prescriptions   No medications on file  Previous Medications   ALBUTEROL  (VENTOLIN  HFA) 108 (90 BASE) MCG/ACT INHALER    Inhale 2 puffs into the lungs every 6 (six) hours as needed for wheezing or shortness of breath.   ASCORBIC ACID (VITAMIN C) 500 MG TABLET    Take 500 mg by mouth daily.   BUPROPION  (WELLBUTRIN  XL) 150 MG 24 HR TABLET    Take 1 tablet (150 mg total) by mouth daily.   BUSPIRONE  (BUSPAR ) 5 MG TABLET    Take 1 tablet (5 mg total) by mouth 2 (two) times daily.   CARVEDILOL  (COREG ) 3.125 MG  TABLET    TAKE 1 TABLET BY MOUTH TWICE  DAILY   CHOLECALCIFEROL (VITAMIN D3) 50 MCG (2000 UT) TABS    Take 2,000 Units by mouth daily.    CLOBETASOL  PROP EMOLLIENT BASE (CLOBETASOL  PROPIONATE E) 0.05 % EMOLLIENT CREAM    Apply 1 Application topically at bedtime.   DICLOFENAC  SODIUM (PENNSAID ) 2 % SOLN    Apply 2 Pump (40 mg total) topically 2 (two) times daily as needed. UP TO TWO PAINFUL AREAS TWICE A DAY   DIPHENHYDRAMINE  (BENADRYL ) 50 MG TABLET    50 mg oral diphenhydramine  administered one hour prior to contrast administration.   DULOXETINE (CYMBALTA) 60 MG CAPSULE    Take 60 mg by mouth daily.   ESTRADIOL  (ESTRACE ) 0.1 MG/GM VAGINAL CREAM    Place 0.5g nightly for two weeks then twice a week after   FAMOTIDINE (PEPCID) 10 MG TABLET    Take 10 mg by mouth daily as needed for heartburn or indigestion.   FUROSEMIDE  (LASIX ) 20 MG TABLET    Take 1 tablet (20 mg total) by mouth daily as needed.   LEVOTHYROXINE  (SYNTHROID ) 88 MCG TABLET    Take 1 tablet (88 mcg total) by mouth daily.   LIDOCAINE  (XYLOCAINE ) 5 % OINTMENT    Apply 1 Application topically as needed (up to 4x/day as needed).   LIDOCAINE  4 % PTCH    Apply 1 patch topically every 12 (twelve) hours. Apply to back   LIDOCAINE -PRILOCAINE  (EMLA ) CREAM    Apply 1 Application topically as needed.   LUBIPROSTONE  (AMITIZA ) 8 MCG CAPSULE    TAKE ONE CAPSULE BY MOUTH TWICE DAILY WITH A MEAL   MAGNESIUM  250 MG TABS    Take 250 mg by mouth daily.   METAXALONE  (SKELAXIN ) 800 MG TABLET    Take 0.5 tablets (400 mg total) by mouth 3 (three) times daily as needed for muscle spasms.   MISC NATURAL PRODUCTS (PUMPKIN SEED  OIL) CAPS    Take 1 capsule by mouth daily.   NALTREXONE  HCL, PAIN, PO    Take 4 mg by mouth at bedtime.   NONFORMULARY OR COMPOUNDED ITEM    Apply 120 Tubes topically daily. Diclofenac /Cyclobenzaprine/lamotrigine/lidocaine /prilocaine  (10480) 2%/2%/6%/5%/1.25% cream QTY: 120 GM SIG: (NEURO) APPLY 1-2 PUMPS (1-2 GMS) TO AFFECTED AREA (S) OR  FOCAL POINTS 3 TO 4 TIMES DAILY. RUB IN FOR 2 MINUTES TO ACHIEVE MAX PENETRATION. WASH HANDS WELL.   OMEGA-3 FATTY  ACIDS (FISH OIL) 1200 MG CAPS    Take 1,200 mg by mouth daily.   OMEPRAZOLE  (PRILOSEC) 40 MG CAPSULE    TAKE 1 CAPSULE BY MOUTH DAILY   POLYETHYL GLYCOL-PROPYL GLYCOL (SYSTANE) 0.4-0.3 % SOLN    Place 1 drop into both eyes at bedtime.   POLYETHYLENE GLYCOL 3350  PO    Take 1 Capful by mouth daily.   PREGABALIN  (LYRICA ) 25 MG CAPSULE    Take 1 capsule (25 mg total) by mouth 2 (two) times daily.   SENNA-DOCUSATE (SENOKOT S) 8.6-50 MG TABLET    Take 2 tablets by mouth daily.   SPIRONOLACTONE  (ALDACTONE ) 25 MG TABLET    TAKE 1 TABLET BY MOUTH IN THE  MORNING   TELMISARTAN  (MICARDIS ) 20 MG TABLET    Take 1 tablet (20 mg total) by mouth daily.   TROSPIUM  CHLORIDE 60 MG CP24    Take 1 capsule (60 mg total) by mouth daily.   TURMERIC (QC TUMERIC COMPLEX PO)    Take 1,000 mg by mouth daily.   VITAMIN E 400 UNITS TABS    Take 400 Units by mouth daily.   Modified Medications   No medications on file  Discontinued Medications   No medications on file    Physical Exam:  Vitals:   10/28/23 1433  BP: 124/78  Pulse: 65  Resp: 10  Temp: (!) 97.5 F (36.4 C)  SpO2: 96%  Weight: 120 lb 9.6 oz (54.7 kg)  Height: 4' 10 (1.473 m)   Body mass index is 25.21 kg/m. Wt Readings from Last 3 Encounters:  10/28/23 120 lb 9.6 oz (54.7 kg)  09/30/23 119 lb 6.4 oz (54.2 kg)  08/28/23 119 lb 3.2 oz (54.1 kg)    Physical Exam Constitutional:      General: She is not in acute distress.    Appearance: She is well-developed. She is not diaphoretic.  HENT:     Head: Normocephalic and atraumatic.     Mouth/Throat:     Pharynx: No oropharyngeal exudate.  Eyes:     Conjunctiva/sclera: Conjunctivae normal.     Pupils: Pupils are equal, round, and reactive to light.  Cardiovascular:     Rate and Rhythm: Normal rate and regular rhythm.     Heart sounds: Normal heart sounds.  Pulmonary:      Effort: Pulmonary effort is normal.     Breath sounds: Normal breath sounds.  Abdominal:     General: Bowel sounds are normal.     Palpations: Abdomen is soft.  Musculoskeletal:     Cervical back: Normal range of motion and neck supple.     Right lower leg: No edema.     Left lower leg: No edema.  Skin:    General: Skin is warm and dry.  Neurological:     Mental Status: She is alert.  Psychiatric:        Mood and Affect: Mood normal.     Labs reviewed: Basic Metabolic Panel: Recent Labs    01/23/23 0956 05/27/23 1110 08/19/23 0927  NA 136 134* 134*  K 4.6 4.6 4.1  CL 99 98 101  CO2 25 30 31   GLUCOSE 108* 110 108*  BUN 18 21 22   CREATININE 0.81 0.81 0.76  CALCIUM 10.3 10.1 9.8  TSH  --  0.73  --    Liver Function Tests: Recent Labs    01/16/23 1130 01/23/23 0956 05/27/23 1110 08/19/23 0927  AST 19 23 17 19   ALT 13 18 15  16  ALKPHOS 63 76  --  39  BILITOT 0.4 0.2 0.4 0.4  PROT 7.4 7.0 6.9 7.0  ALBUMIN 3.9 4.0  --  4.0   No results for input(s): LIPASE, AMYLASE in the last 8760 hours. No results for input(s): AMMONIA in the last 8760 hours. CBC: Recent Labs    01/16/23 1130 01/23/23 0956 05/27/23 1110 08/19/23 0927  WBC 6.9 9.3 7.3 9.0  NEUTROABS 4.3  --  4,665 5.9  HGB 12.0 12.5 11.8 12.4  HCT 36.3 38.9 37.0 36.5  MCV 89.4 92 89.8 87.1  PLT 154 128* 142 135*   Lipid Panel: No results for input(s): CHOL, HDL, LDLCALC, TRIG, CHOLHDL, LDLDIRECT in the last 8760 hours. TSH: Recent Labs    05/27/23 1110  TSH 0.73   A1C: No results found for: HGBA1C   Assessment/Plan  Assessment & Plan Dysuria  Urinary retention and frequency post-Botox  injection, with possible UTI. Retention may be related to back issues. - Obtain clean catch urine sample for urinalysis and culture. - Continue catheterization as per urogynecologist's instructions. - Discuss bladder issues with neuro spine specialist. - Consider disposable briefs for  incontinence.  Spinal stenosis of lumbar spine Increase in pain, particularly in right hip, managed by pain clinic.  - Continue naltrexone  and Lyrica . - Attend neuro spine specialist appointment for further evaluation. - Discuss back issues and bladder problems with neuro spine specialist.  Hypertension Blood pressure well-controlled on current regimen. - Continue carvedilol , telmisartan , and spironolactone .  Anxiety and Depression Stable on Cymbalta, Buspar , and Wellbutrin  under psychiatrist care. - Continue Cymbalta, Buspar , and Wellbutrin . - Follow up with psychiatrist.  Constipation Regular bowel movements on current regimen, important for bladder issue management. - Continue Amitiza  and Senokot S.  Gastroesophageal Reflux Disease (GERD) No issues on omeprazole . - Continue omeprazole .  Hx of Breast Cancer No active treatment, under surveillance with annual mammograms. - Continue annual mammograms.  Constipation Well managed on current regimen.   Follow-up Multiple upcoming specialist appointments and routine follow-ups scheduled. - Follow up with neuro spine specialist in August. - Attend rheumatology appointment in October. - Attend bone density scan in January or February. - Continue internal medicine follow-ups every three months.  Return in about 3 months (around 01/28/2024) for routine follow up.:   Riggs Dineen K. Stacey BODILY [facility] [phone]

## 2023-10-29 ENCOUNTER — Other Ambulatory Visit: Payer: Self-pay

## 2023-10-29 ENCOUNTER — Ambulatory Visit: Payer: Self-pay | Admitting: Nurse Practitioner

## 2023-10-29 DIAGNOSIS — R300 Dysuria: Secondary | ICD-10-CM

## 2023-10-30 ENCOUNTER — Ambulatory Visit: Payer: Self-pay | Admitting: Nurse Practitioner

## 2023-10-30 LAB — URINE CULTURE
MICRO NUMBER:: 16730890
SPECIMEN QUALITY:: ADEQUATE

## 2023-10-31 ENCOUNTER — Ambulatory Visit: Payer: Medicare Other

## 2023-11-06 ENCOUNTER — Ambulatory Visit: Payer: Medicare Other

## 2023-11-06 DIAGNOSIS — I428 Other cardiomyopathies: Secondary | ICD-10-CM

## 2023-11-06 LAB — CUP PACEART REMOTE DEVICE CHECK
Battery Remaining Longevity: 126 mo
Battery Remaining Percentage: 100 %
Brady Statistic RA Percent Paced: 0 %
Brady Statistic RV Percent Paced: 100 %
Date Time Interrogation Session: 20250730024000
HighPow Impedance: 42 Ohm
Implantable Lead Connection Status: 753985
Implantable Lead Connection Status: 753985
Implantable Lead Connection Status: 753985
Implantable Lead Implant Date: 20080116
Implantable Lead Implant Date: 20080116
Implantable Lead Implant Date: 20080116
Implantable Lead Location: 753858
Implantable Lead Location: 753859
Implantable Lead Location: 753860
Implantable Lead Model: 157
Implantable Lead Model: 4469
Implantable Lead Model: 4555
Implantable Lead Serial Number: 136532
Implantable Lead Serial Number: 161542
Implantable Lead Serial Number: 473495
Implantable Pulse Generator Implant Date: 20241030
Lead Channel Impedance Value: 399 Ohm
Lead Channel Impedance Value: 489 Ohm
Lead Channel Impedance Value: 959 Ohm
Lead Channel Pacing Threshold Amplitude: 0.5 V
Lead Channel Pacing Threshold Amplitude: 0.7 V
Lead Channel Pacing Threshold Pulse Width: 0.4 ms
Lead Channel Pacing Threshold Pulse Width: 0.4 ms
Lead Channel Setting Pacing Amplitude: 2 V
Lead Channel Setting Pacing Amplitude: 2 V
Lead Channel Setting Pacing Amplitude: 2.2 V
Lead Channel Setting Pacing Pulse Width: 0.4 ms
Lead Channel Setting Pacing Pulse Width: 0.8 ms
Lead Channel Setting Sensing Sensitivity: 0.5 mV
Lead Channel Setting Sensing Sensitivity: 1 mV
Pulse Gen Serial Number: 157167

## 2023-11-08 ENCOUNTER — Ambulatory Visit: Payer: Self-pay | Admitting: Internal Medicine

## 2023-11-12 ENCOUNTER — Encounter: Payer: Self-pay | Admitting: Physical Medicine and Rehabilitation

## 2023-11-13 ENCOUNTER — Ambulatory Visit: Payer: Self-pay | Admitting: Internal Medicine

## 2023-11-14 ENCOUNTER — Telehealth: Payer: Self-pay | Admitting: *Deleted

## 2023-11-14 NOTE — Telephone Encounter (Signed)
 Verification Received from [employer].  No Copay, PA Required.   PA initiated through [facility] and APPROVED 11/14/2023-11/13/2024 Auth #: [id number]  Called daughter and [facility] to call and schedule a Lab appointment before her Prolia  Injection.  Message sent to Harlene to place order

## 2023-11-15 ENCOUNTER — Other Ambulatory Visit: Payer: Self-pay | Admitting: Nurse Practitioner

## 2023-11-15 DIAGNOSIS — M810 Age-related osteoporosis without current pathological fracture: Secondary | ICD-10-CM

## 2023-11-15 NOTE — Progress Notes (Signed)
 Interstitial Lung Disease Multidisciplinary Conference   Gracelynne Benedict Brimley    MRN [id number]    DOB 26-Feb-1934  Primary Care Physician:Eubanks, Harlene POUR, NP  Referring Physician: Dr. Kara  Time of Conference: 7.30am- 8.30am Date of conference: 11/13/2023 Location of Conference: -  Virtual  Participating Pulmonary: Dr. Dorethia Cave Pathology: Dr Katrine Muskrat, Radiology: Dr Newell Eke,  Others:   Brief History: Maigan Bittinger is an 89 year old woman, never smoker with HFrEF, dementia, hypertension and osteoarthritis who returns to pulmonary clinic for abnormal lung findings on chest imaging. I saw her last in 2022, imaging appeared concerning for sarcoidosis and plan was to monitor. Lost to follow up and HRCT repeated in 01/2023 by Oncology showing more fibrotic pattern. I am checking ILD panel and having her fill out ILD packet. Would like to review scan and discuss treatment options as she is elderly.    PFT    Latest Ref Rng & Units 07/17/2023    3:19 PM 01/19/2021   11:44 AM  PFT Results  FVC-Pre L 1.48  1.90   FVC-Predicted Pre % 93  142   FVC-Post L 1.78  1.93   FVC-Predicted Post % 111  144   Pre FEV1/FVC % % 99  88   Post FEV1/FCV % % 92  91   FEV1-Pre L 1.48  1.67   FEV1-Predicted Pre % 128  164   FEV1-Post L 1.65  1.75   DLCO uncorrected ml/min/mmHg 9.15  11.26   DLCO UNC% % 61  72   DLCO corrected ml/min/mmHg 9.66  11.48   DLCO COR %Predicted % 65  73   DLVA Predicted % 92  87   TLC L 3.10  3.36   TLC % Predicted % 74  78   RV % Predicted % 60  67       MDD discussion of CT scan    - Date or time period of scan: HRCT: 01/28/2023 CT Chest: WO: 07/13/2022 HRCT: 09/09/2020   - Discussion synopsis:  In june 2022: UL and some some LL but UL more predmoniant. Per Dr Eke - alternate diagnosi and ok to be cw sarcoid.  In Oct 2024: more fibrosed. It has changed. There is honeycombing in lingular. But NO CC gradient.  Now it looks  indetermiante. No nodal disease currently. 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 And if immune marker positive consider anti  inflammatory. Also consider clinical trial of teprostinil v placebo for progressive phenotype   Time Spent in preparation and discussion:  > 30 min    SIGNATURE   Dr. Dorethia Cave, M.D., F.C.C.P,  Pulmonary and Critical Care Medicine Staff Physician, [facility] Director - Interstitial Lung Disease  Program  Pulmonary Fibrosis [facility] at [facility] [place], [place], [address]  Pager: [phone], If no answer or between  15:00h - 7:00h: call [phone] Telephone: [phone]  12:14 PM 11/15/2023 ...................................................................................................................SABRA References: Diagnosis of Hypersensitivity Pneumonitis in Adults. An Official ATS/JRS/ALAT Clinical Practice Guideline. Ragu G et al, Am J Respir Crit Care Med. 2020 Aug 1;202(3):e36-e69.  Diagnosis of Idiopathic Pulmonary Fibrosis. An Official ATS/ERS/JRS/ALAT Clinical Practice Guideline. Raghu G et al, Am J Respir Crit Care Med. 2018 Sep 1;198(5):e44-e68.   IPF Suspected   Histopath ology Pattern      UIP  Probable UIP  Indeterminate for  UIP  Alternative  diagnosis    UIP  IPF  IPF  IPF  Non-IPF dx   HRCT   Probabe UIP  IPF  IPF  IPF (Likely)**  Non-IPF dx  Pattern  Indeterminate for UIP  IPF  IPF (Likely)**  Indeterminate  for IPF**  Non-IPF dx    Alternative diagnosis  IPF (Likely)**/ non-IPF dx   Non-IPF dx  Non-IPF dx  Non-IPF dx     Idiopathic pulmonary fibrosis diagnosis based upon HRCT and Biopsy paterns.  ** IPF is the likely diagnosis when any of following features are present:  Moderate-to-severe traction bronchiectasis/bronchiolectasis (defined as mild traction bronchiectasis/bronchiolectasis in four or more lobes including the lingual as a lobe, or moderate to severe traction bronchiectasis in two or more lobes) in a man over age 28 years or in a woman over age 48 years Extensive (>30%) reticulation on HRCT and an age >70 years  Increased neutrophils and/or absence of lymphocytosis in BAL fluid  Multidisciplinary discussion reaches a confident diagnosis of IPF.   **Indeterminate for IPF  Without an adequate biopsy is unlikely to be IPF  With an adequate biopsy may be reclassified to a more specific diagnosis after multidisciplinary discussion and/or additional consultation.   dx = diagnosis; HRCT = high-resolution computed tomography; IPF = idiopathic pulmonary fibrosis; UIP = usual interstitial pneumonia.

## 2023-11-18 ENCOUNTER — Other Ambulatory Visit: Payer: Self-pay | Admitting: Nurse Practitioner

## 2023-11-18 NOTE — Telephone Encounter (Signed)
 High risk or very high risk warning populated when attempting to refill spironolactone  medication. RX request sent to PCP for review and approval if warranted.

## 2023-11-20 ENCOUNTER — Other Ambulatory Visit

## 2023-11-20 LAB — COMPREHENSIVE METABOLIC PANEL WITH GFR
AG Ratio: 1.6 (calc) (ref 1.0–2.5)
ALT: 17 U/L (ref 6–29)
AST: 21 U/L (ref 10–35)
Albumin: 4.2 g/dL (ref 3.6–5.1)
Alkaline phosphatase (APISO): 46 U/L (ref 37–153)
BUN: 16 mg/dL (ref 7–25)
CO2: 29 mmol/L (ref 20–32)
Calcium: 10.3 mg/dL (ref 8.6–10.4)
Chloride: 98 mmol/L (ref 98–110)
Creat: 0.83 mg/dL (ref 0.60–0.95)
Globulin: 2.7 g/dL (ref 1.9–3.7)
Glucose, Bld: 93 mg/dL (ref 65–99)
Potassium: 4.3 mmol/L (ref 3.5–5.3)
Sodium: 135 mmol/L (ref 135–146)
Total Bilirubin: 0.5 mg/dL (ref 0.2–1.2)
Total Protein: 6.9 g/dL (ref 6.1–8.1)
eGFR: 68 mL/min/1.73m2 (ref >=60)

## 2023-11-21 ENCOUNTER — Ambulatory Visit: Payer: Self-pay | Admitting: Nurse Practitioner

## 2023-12-02 ENCOUNTER — Ambulatory Visit (INDEPENDENT_AMBULATORY_CARE_PROVIDER_SITE_OTHER): Payer: Medicare Other | Admitting: Nurse Practitioner

## 2023-12-02 DIAGNOSIS — M810 Age-related osteoporosis without current pathological fracture: Secondary | ICD-10-CM | POA: Diagnosis not present

## 2023-12-02 MED ORDER — DENOSUMAB 60 MG/ML ~~LOC~~ SOSY
60.0000 mg | PREFILLED_SYRINGE | SUBCUTANEOUS | Status: AC
Start: 2023-12-02 — End: 2024-05-30

## 2023-12-02 NOTE — Progress Notes (Signed)
 Patient is in office today for a nurse visit for  Prolia Injection . Patient Injection was given in the  Right arm. Patient tolerated injection well.

## 2023-12-04 ENCOUNTER — Ambulatory Visit (INDEPENDENT_AMBULATORY_CARE_PROVIDER_SITE_OTHER): Admitting: Obstetrics and Gynecology

## 2023-12-04 ENCOUNTER — Other Ambulatory Visit (HOSPITAL_COMMUNITY)
Admission: RE | Admit: 2023-12-04 | Discharge: 2023-12-04 | Disposition: A | Discharge status: Home / Self Care | Source: Other Acute Inpatient Hospital | Attending: Obstetrics and Gynecology | Admitting: Obstetrics and Gynecology

## 2023-12-04 ENCOUNTER — Encounter: Payer: Self-pay | Admitting: Obstetrics and Gynecology

## 2023-12-04 VITALS — BP 111/75 | HR 76

## 2023-12-04 DIAGNOSIS — R350 Frequency of micturition: Secondary | ICD-10-CM | POA: Diagnosis not present

## 2023-12-04 DIAGNOSIS — N9089 Other specified noninflammatory disorders of vulva and perineum: Secondary | ICD-10-CM

## 2023-12-04 DIAGNOSIS — R82998 Other abnormal findings in urine: Secondary | ICD-10-CM | POA: Diagnosis present

## 2023-12-04 DIAGNOSIS — N952 Postmenopausal atrophic vaginitis: Secondary | ICD-10-CM

## 2023-12-04 DIAGNOSIS — R102 Pelvic and perineal pain: Secondary | ICD-10-CM

## 2023-12-04 LAB — POCT URINALYSIS DIP (CLINITEK)
Bilirubin, UA: NEGATIVE
Blood, UA: NEGATIVE
Glucose, UA: NEGATIVE mg/dL
Ketones, POC UA: NEGATIVE mg/dL
Nitrite, UA: NEGATIVE
POC PROTEIN,UA: NEGATIVE
Spec Grav, UA: 1.01 (ref 1.010–1.025)
Urobilinogen, UA: 0.2 U/dL
pH, UA: 7 (ref 5.0–8.0)

## 2023-12-04 MED ORDER — LIDOCAINE-PRILOCAINE 2.5-2.5 % EX CREA: 1.0000 | TOPICAL_CREAM | CUTANEOUS | 2 refills | Status: DC | PRN

## 2023-12-04 NOTE — Progress Notes (Signed)
 [facility] Return Visit  SUBJECTIVE  History of Present Illness: [name] is a 89 y.o. female seen in follow-up for mixed incontinence. Had intravesical botox  on 09/26/23. Also had second urethral bulking on 05/16/23.   Catheterizing was going well but stopped last week when cath volumes were less than (per instructions). [name] feels that she has had more leakage since stopping, especially overnight. They did try once to cath before bed but this did not change her leakage overnight. Has some soreness at the urethra. Has been using estrace  cream.   Past Medical History: Patient  has a past medical history of Arthritis, Cancer (HCC), Cataracts, bilateral, CHF (congestive heart failure) (HCC), Complication of anesthesia, Depression, Dyslipidemia, Fainted (04/21/06), GERD (gastroesophageal reflux disease), Headache(784.0), Hearing loss, HLD (hyperlipidemia), Hypertension, Hypothyroidism, ICD (implantable cardiac defibrillator) in place, ICD (implantable cardiac defibrillator), biventricular, in situ, LBBB (left bundle branch block), Memory loss, Nonischemic cardiomyopathy (HCC), Normal coronary arteries, Pacemaker, Syncope, Systolic CHF (HCC), Vertigo, and Wears glasses.   Past Surgical History: She  has a past surgical history that includes Total knee arthroplasty (05/17/01); Lumbar fusion (2006); Mastectomy, partial (2008); Breast surgery (2000); Joint replacement (06/14/01); Pacemaker insertion (04/23/06); Back surgery; Cardiac catheterization; Mass excision (11/08/2011); implantable cardioverter defibrillator generator change (N/A, 12/18/2012); Cataract extraction; Eye surgery; Total knee arthroplasty (Left, 11/29/2014); Breast lumpectomy (Left, 2008); Colonoscopy; Mastectomy w/ sentinel node biopsy (Left, 06/04/2019); BIV ICD GENERATOR CHANGEOUT (N/A, 02/06/2023); and IR INJECT DIAG/THERA/INC NEEDLE/CATH/PLC EPI/LUMB/SAC W/IMG (06/28/2023).   Medications: She has a current  medication list which includes the following prescription(s): albuterol , ascorbic acid, bupropion , buspirone , carvedilol , vitamin d3, clobetasol  propionate e, diclofenac  sodium, diphenhydramine , duloxetine, estradiol , famotidine, furosemide , levothyroxine , lidocaine , lidocaine , lubiprostone , magnesium , metaxalone , pumpkin seed  oil, naltrexone  hcl (pain), NONFORMULARY OR COMPOUNDED ITEM, fish oil, omeprazole , systane, polyethylene glycol 3350 , pregabalin , senna-docusate, spironolactone , telmisartan , turmeric, vitamin e, and lidocaine -prilocaine , and the following Facility-Administered Medications: denosumab .   Allergies: Patient is allergic to iodinated contrast media, iodine , shellfish allergy, memantine , aspirin , and codeine.   Social History: Patient  reports that she has never smoked. She has never been exposed to tobacco smoke. She has never used smokeless tobacco. She reports that she does not drink alcohol and does not use drugs.     OBJECTIVE     Physical Exam: Vitals:   12/04/23 1136  BP: 111/75  Pulse: 76   Gen: No apparent distress, A&O x 3.  Pessary removed and cleaned. Speculum exam revealed no lesions.    Results for orders placed or performed in visit on 12/04/23  POCT URINALYSIS DIP (CLINITEK)   Collection Time: 12/04/23 12:12 PM  Result Value Ref Range   Color, UA yellow yellow   Clarity, UA clear clear   Glucose, UA negative negative mg/dL   Bilirubin, UA negative negative   Ketones, POC UA negative negative mg/dL   Spec Grav, UA 8.989 8.989 - 1.025   Blood, UA negative negative   pH, UA 7.0 5.0 - 8.0   POC PROTEIN,UA negative negative, trace   Urobilinogen, UA 0.2 0.2 or 1.0 E.U./dL   Nitrite, UA Negative Negative   Leukocytes, UA Small (1+) (A) Negative   *Note: Due to a large number of results and/or encounters for the requested time period, some results have not been displayed. A complete set of results can be found in Results Review.    ASSESSMENT AND  PLAN    [name] is a 89 y.o. with:  1. Vaginal atrophy   2. Vulvar lesion  3. Urinary frequency   4. Leukocytes in urine     - Can cath as needed when she feels voided volumes are less or before bed. May take a few more months to have complete resolution.  - continue estrace  cream 2-3 times per week - previously prescribed emla  cream but she did not receive it. New Rx sent for her to use as needed for soreness.  - Will send urine for culture to r/o UTI - Follow up end of December and will do another pessary cleaning at that time.    [name], MD

## 2023-12-06 ENCOUNTER — Ambulatory Visit: Payer: Self-pay | Admitting: Obstetrics and Gynecology

## 2023-12-06 DIAGNOSIS — N3000 Acute cystitis without hematuria: Secondary | ICD-10-CM

## 2023-12-06 MED ORDER — SULFAMETHOXAZOLE-TRIMETHOPRIM 800-160 MG PO TABS: 1.0000 | ORAL_TABLET | Freq: Two times a day (BID) | ORAL | 0 refills | Status: AC

## 2023-12-08 LAB — URINE CULTURE: Culture: >=100000 — AB

## 2023-12-16 ENCOUNTER — Encounter: Payer: Self-pay | Admitting: Physical Medicine and Rehabilitation

## 2023-12-16 ENCOUNTER — Encounter: Attending: Physical Medicine and Rehabilitation | Admitting: Physical Medicine and Rehabilitation

## 2023-12-16 VITALS — BP 142/74 | HR 61 | Ht <= 58 in | Wt 115.0 lb

## 2023-12-16 DIAGNOSIS — M961 Postlaminectomy syndrome, not elsewhere classified: Secondary | ICD-10-CM | POA: Insufficient documentation

## 2023-12-16 DIAGNOSIS — G894 Chronic pain syndrome: Secondary | ICD-10-CM | POA: Diagnosis present

## 2023-12-16 DIAGNOSIS — M48062 Spinal stenosis, lumbar region with neurogenic claudication: Secondary | ICD-10-CM | POA: Diagnosis present

## 2023-12-16 MED ORDER — BUPRENORPHINE 5 MCG/HR TD PTWK: 1.0000 | MEDICATED_PATCH | TRANSDERMAL | 3 refills | Status: DC

## 2023-12-16 NOTE — Patient Instructions (Signed)
  Pt is an 89 yr old female with hx of breast CA and RUE lymphedema, as well as uterine prolapse, NICCM- as well as Chronic low back pain with B/L sciatica- also has memory issues on Aricept ; hx of lumbar fusion at L4/5. Has a hx of Bipolar Type II depression. Has ICD pacemaker- due to bradycardia and CHF- Is on Zoloft  200 mg daily, of note, with Bipolar d/o;  Trilpetal and Wellbutrin  for mood. Also has interstitial lung disease   Here for f/u on Lumbar radiculopathy    Will try to get pt butrans  patch-  5 mcg- 1 patch per week- suggest putting tegaderm- over the counter type of bandaid - clear bandaid over it- use for 7 DAYS-   2.  Stop Naltrexone  5 days before you start Butrans  patch.   3.  Do not stop any other meds or patches.   4. We have tried EVERYTHING that patient can tolerate due to her age and sedation- is allergic to codeine- Hydrocodone  and oxycodone  makes her too sleepy and nausea, cannot try again; has failed Naltrexone , tramadol - and pt cannot tolerate higher doses of meds- also has taken Duloxetine- is still on but pain is still progressively worse;  has also tried gabapentin - too sedating and confused; cannot take higher doses of Lyrica - is on  25 mg BID due to side effects , cannot tolerate higher dose;  and has also tried multiple muscle relaxants- on Skelaxin - which is the only muscle relaxant that has no anticholinergic effects.    5.  F/U in 3 months- - but CALL ME if everything goes WELL, in 1 month- if it's not going well, call me before that.

## 2023-12-16 NOTE — Progress Notes (Signed)
 Subjective:    Patient ID: [name], female    DOB: 06-29-1933, 89 y.o.   MRN: [id number]  HPI  Pt is an 89 yr old female with hx of breast CA and RUE lymphedema, as well as uterine prolapse, NICCM- as well as Chronic low back pain with B/L sciatica- also has memory issues on Aricept ; hx of lumbar fusion at L4/5. Has a hx of Bipolar Type II depression. Has ICD pacemaker- due to bradycardia and CHF- Is on Zoloft  200 mg daily, of note, with Bipolar d/o;  Trilpetal and Wellbutrin  for mood. Also has interstitial lung disease   Here for f/u on Lumbar radiculopathy- Also has interstitial lung disease   Saw Dr Vona-  Was more helpful in explaining- and expressed risk issues due to pacemaker.   History of lumbar fusion at L4-5, now with severe spinal stenosis at L2-3 and L3-4,    Basically we are stuck-   Done ESI's- last 2 injections she had- 1 helped zero% and the other lasted 1 week or so.   There are other meds out there, but they all make her sleepy- and still not able to sleep.    Completed all Physical therapy appointments- 8 appointments-   Doing some of HEP was given-  at minimum- 2x/week   Depends on how much back pain she's having. In short term, back pain made worse by HEP.     Rating pain 6/10- doesn't go below a 6/10-  Pain Diary- sometimes gets up to 6-7/10-  When she walks, it gets worse- and starts in L2 area and radiates down to buttocks and into back of thighs and  radiates all the way to her feet.    So many meds cause nausea/vomiting OR sleepiness/fogginess.   Use the salonpas patches every day- they help some-    Pain Inventory Average Pain 6 Pain Right Now 6 My pain is dull and aching  In the last 24 hours, has pain interfered with the following? General activity 0 Relation with others 0 Enjoyment of life 7 What TIME of day is your pain at its worst? morning  Sleep (in general) Fair  Pain is worse with: bending, standing, and some  activites Pain improves with: rest and heat/ice Relief from Meds: 4  Family History  Problem Relation Age of Onset   Hypertension Mother    Arthritis Mother    Hypertension Father    Hypertension Brother    Hypertension Brother    Social History   Socioeconomic History   Marital status: Married    Spouse name: Not on file   Number of children: 1   Years of education: Masters   Highest education level: Master's degree (e.g., MA, MS, MEng, MEd, MSW, MBA)  Occupational History   Occupation: Retired  Tobacco Use   Smoking status: Never    Passive exposure: Never   Smokeless tobacco: Never  Vaping Use   Vaping status: Never Used  Substance and Sexual Activity   Alcohol use: No   Drug use: No   Sexual activity: Not Currently    Birth control/protection: Post-menopausal  Other Topics Concern   Not on file  Social History Narrative   Lives at home with husband.   Right-handed.      As of 07/28/2014   Diet: No special diet   Caffeine: yes, Chocolate, tea and sodas    Married: YES, 1970   House: Yes, 2 stories, 2-3 persons live in home   Pets:  No   Current/Past profession: Nurse, mental health, Nurse Practitioner    Exercise: Yes 2-3 x weekly   Living Will: Yes   DNR: No   POA/HPOA: No      Social Drivers of Corporate investment banker Strain: Low Risk  (09/30/2023)   Overall Financial Resource Strain (CARDIA)    Difficulty of Paying Living Expenses: Not hard at all  Food Insecurity: No Food Insecurity (09/30/2023)   Hunger Vital Sign    Worried About Running Out of Food in the Last Year: Never true    Ran Out of Food in the Last Year: Never true  Transportation Needs: No Transportation Needs (09/30/2023)   PRAPARE - Administrator, Civil Service (Medical): No    Lack of Transportation (Non-Medical): No  Physical Activity: Unknown (09/30/2023)   Exercise Vital Sign    Days of Exercise per Week: Patient declined    Minutes of Exercise per Session: Not on file   Stress: Patient Declined (09/30/2023)   [employer] of Occupational Health - Occupational Stress Questionnaire    Feeling of Stress: Patient declined  Social Connections: Moderately Integrated (09/30/2023)   Social Connection and Isolation Panel    Frequency of Communication with Friends and Family: Three times a week    Frequency of Social Gatherings with Friends and Family: Patient declined    Attends Religious Services: More than 4 times per year    Active Member of [employer] or Organizations: Yes    Attends Banker Meetings: More than 4 times per year    Marital Status: Widowed   Past Surgical History:  Procedure Laterality Date   BACK SURGERY     lumbar fusion    BIV ICD GENERATOR CHANGEOUT N/A 02/06/2023   Procedure: BIV ICD GENERATOR CHANGEOUT;  Surgeon: [name], MD;  Location: [facility] INVASIVE CV LAB;  Service: Cardiovascular;  Laterality: N/A;   BREAST LUMPECTOMY Left 2008   BREAST SURGERY  2000   LUMP REMOVAL. STAGE 1 CANCER   CARDIAC CATHETERIZATION     CATARACT EXTRACTION     COLONOSCOPY     EYE SURGERY     IMPLANTABLE CARDIOVERTER DEFIBRILLATOR GENERATOR CHANGE N/A 12/18/2012   Procedure: IMPLANTABLE CARDIOVERTER DEFIBRILLATOR GENERATOR CHANGE;  Surgeon: Danelle ORN Waddell, MD;  Location: [facility] CATH LAB;  Service: Cardiovascular;  Laterality: N/A;   IR INJECT/THERA/INC NEEDLE/CATH/PLC EPI/LUMB/SAC W/IMG  06/28/2023   JOINT REPLACEMENT  06/14/01   right   LUMBAR FUSION  2006   MASS EXCISION  11/08/2011   Procedure: EXCISION MASS;  Surgeon: Jina Nephew, MD;  Location: [facility] ORS;  Service: General;  Laterality: Left;  Excision Left Thigh Mass   MASTECTOMY W/ SENTINEL NODE BIOPSY Left 06/04/2019   Procedure: LEFT MASTECTOMY WITH SENTINEL LYMPH NODE BIOPSY;  Surgeon: Nephew Jina, MD;  Location: [facility] OR;  Service: General;  Laterality: Left;   MASTECTOMY, PARTIAL  2008   GOT PACEMAKER AND DEFIB AT THAT TIME   PACEMAKER INSERTION  04/23/06   TOTAL KNEE ARTHROPLASTY   05/17/01   RIGHT KNEE   TOTAL KNEE ARTHROPLASTY Left 11/29/2014   Procedure: TOTAL LEFT KNEE ARTHROPLASTY;  Surgeon: Donnice Car, MD;  Location: [facility];  Service: Orthopedics;  Laterality: Left;   Past Surgical History:  Procedure Laterality Date   BACK SURGERY     lumbar fusion    BIV ICD GENERATOR CHANGEOUT N/A 02/06/2023   Procedure: BIV ICD GENERATOR CHANGEOUT;  Surgeon: [name], MD;  Location: [facility] INVASIVE CV LAB;  Service: Cardiovascular;  Laterality: N/A;   BREAST LUMPECTOMY Left 2008   BREAST SURGERY  2000   LUMP REMOVAL. STAGE 1 CANCER   CARDIAC CATHETERIZATION     CATARACT EXTRACTION     COLONOSCOPY     EYE SURGERY     IMPLANTABLE CARDIOVERTER DEFIBRILLATOR GENERATOR CHANGE N/A 12/18/2012   Procedure: IMPLANTABLE CARDIOVERTER DEFIBRILLATOR GENERATOR CHANGE;  Surgeon: Danelle LELON Birmingham, MD;  Location[facility] C[facility] LAB;  Service: Cardiovascular;  Laterality: N/A;   IR INJECT/THERA/INC NEEDLE/CATH/PLC EPI/LUMB/SAC W/IMG  06/28/2023   JOINT REPLACEMENT  06/14/01   right   LUMBAR FUSION  2006   MASS EXCISION  11/08/2011   Procedure: EXCISION MASS;  Surgeon: Jina Nephew, MD;  Location: [facility];  Service: General;  Laterality: Left;  Excision Left Thigh Mass   MASTECTOMY W/ SENTINEL NODE BIOPSY Left 06/04/2019   Procedure: LEFT MASTECTOMY WITH SENTINEL LYMPH NODE BIOPSY;  Surgeon: Nephew Jina, MD;  Location: [facility] OR;  Service: General;  Laterality: Left;   MASTECTOMY, PARTIAL  2008   GOT PACEMAKER AND DEFIB AT THAT TIME   PACEMAKER INSERTION  04/23/06   TOTAL KNEE ARTHROPLASTY  05/17/01   RIGHT KNEE   TOTAL KNEE ARTHROPLASTY Left 11/29/2014   Procedure: TOTAL LEFT KNEE ARTHROPLASTY;  Surgeon: Donnice Car, MD;  Location: [facility];  Service: Orthopedics;  Laterality: Left;   Past Medical History:  Diagnosis Date   Arthritis    Cancer (HCC)    left breast cancer    Cataracts, bilateral    removed by surgery   CHF (congestive heart failure) (HCC)    PACEMAKER & DEFIB   Complication of  anesthesia    hypotensive after back surgery in 2006   Depression    Dyslipidemia    Fainted 04/21/06   AT [facility]   GERD (gastroesophageal reflux disease)    Headache(784.0)    Hearing loss    bilateral hearing aids   HLD (hyperlipidemia)    diet controlled    Hypertension    Hypothyroidism    ICD (implantable cardiac defibrillator) in place    pt has pacer/icd   ICD (implantable cardiac defibrillator), biventricular, in situ    LBBB (left bundle branch block)    Memory loss    Nonischemic cardiomyopathy (HCC)    Normal coronary arteries    s/p cardiac cath 2007   Pacemaker    ICD [facility]   Syncope    Systolic CHF [facility])    Vertigo    Wears glasses    BP (!) 142/74   Pulse 61   Ht 4' 10 (1.473 m)   Wt 115 lb (52.2 kg)   LMP  (LMP Unknown)   SpO2 96%   BMI 24.04 kg/m   Opioid Risk Score:   Fall Risk Score:  `1  Depression screen [facility] 2/9     10/28/2023    2:37 PM 08/28/2023   11:57 AM 05/27/2023   10:43 AM 05/01/2023   11:31 AM 01/30/2023   10:38 AM 08/01/2022   10:51 AM 07/09/2022   10:41 AM  Depression screen PHQ 2/9  Decreased Interest 0 0 0 1 1 0 0  Down, Depressed, Hopeless 0 0 0 1 1 0 0  PHQ - 2 Score 0 0 0 2 2 0 0  Altered sleeping 0        Tired, decreased energy 1        Change in appetite 0        Feeling bad or failure  about yourself  0        Trouble concentrating 0        Moving slowly or fidgety/restless 0        Suicidal thoughts 0        PHQ-9 Score 1            Review of Systems  Musculoskeletal:  Positive for back pain and gait problem.       Pain in the back of both legs  All other systems reviewed and are negative.      Objective:   Physical Exam  Awake, alert, appropriate, adequate posture for 89 years old on table- feet not touching floor- uses quad cane-        Assessment & Plan:    Pt is an 89 yr old female with hx of breast CA and RUE lymphedema, as well as uterine prolapse, NICCM- as well as Chronic low  back pain with B/L sciatica- also has memory issues on Aricept ; hx of lumbar fusion at L4/5. Has a hx of Bipolar Type II depression. Has ICD pacemaker- due to bradycardia and CHF- Is on Zoloft  200 mg daily, of note, with Bipolar d/o;  Trilpetal and Wellbutrin  for mood. Also has interstitial lung disease   Here for f/u on Lumbar radiculopathy    Will try to get pt butrans  patch-  5 mcg- 1 patch per week- suggest putting tegaderm- over the counter type of bandaid - clear bandaid over it- use for 7 DAYS-   2.  Stop Naltrexone  5 days before you start Butrans  patch.   3.  Do not stop any other meds or patches.   4. We have tried EVERYTHING that patient can tolerate due to her age and sedation- is allergic to codeine- Hydrocodone  and oxycodone  makes her too sleepy and nausea, cannot try again; has failed Naltrexone , tramadol - and pt cannot tolerate higher doses of meds- also has taken Duloxetine- is still on but pain is still progressively worse;  has also tried gabapentin - too sedating and confused; cannot take higher doses of Lyrica - is on  25 mg BID due to side effects , cannot tolerate higher dose;  and has also tried multiple muscle relaxants- on Skelaxin - which is the only muscle relaxant that has no anticholinergic effects.    5.  F/U in 3 months- - but CALL ME if everything goes WELL, in 1 month- if it's not going well, call me before that.    I spent a total of 36   minutes on total care today- >50% coordination of care- due to discussion of options of pain- and options for pain control- and how to start Butrans .

## 2023-12-18 ENCOUNTER — Telehealth: Payer: Self-pay

## 2023-12-18 NOTE — Telephone Encounter (Signed)
(  Key: [id number]) PA Case ID #: [id number] Rx #: [id number] Need Help? Call us  at [phone] Status sent iconSent to Plan today Drug Buprenorphine  5MCG/HR weekly patches ePA cloud logo Form [employer] Medicare Part D Electronic Prior Authorization Form [phone] NCPDP)

## 2023-12-20 NOTE — Telephone Encounter (Signed)
 Outcome Approved on September 10 by [facility] Medicare 2017 NCPDP Request Reference Number: [id number]. BUPRENORPHIN DIS 5MCG/HR is approved through 01/17/2024. Your patient may now fill this prescription and it will be covered. Effective Date: 12/18/2023 Authorization Expiration Date: 01/17/2024

## 2023-12-21 NOTE — Progress Notes (Signed)
 [facility]  Urgent Care  Gutierrez PA-C  Patient ID:  [name] is a 89 y.o.  1933-07-28   female    Eye Problem (Patient reports left eye drainage beginning this morning. States eye appears more swollen. Denies vision changes. Daily medications taken this morning. ) and Vaginal Discharge (Patient reports vaginal discharge and increased urinary frequency beginning 12/18/23. Patient is undergoing catheterization treatment for urinary retention. Denies fevers/emesis. Daughter reports last cath sample was beginning to turn cloudy. )    The following information was reviewed by members of the visit team:  Tobacco  Allergies  Meds  Problems  Med Hx  Surg Hx  Fam Hx     Medical History[1] Problem List[2]  History of Present Illness This is a patient with a history of osteoporosis, atherosclerosis, arteriosclerosis of the abdominal aorta, intermittent claudication due to atherosclerosis of the artery of the limb, bipolar disorder, malignant tumor of the breast, cerebrovascular accident (CVA), chronic low back pain, chronic systolic heart failure, congestive heart failure, continuous leakage of urine, depression, vertigo, dyslipidemia, essential hypertension, essential tremor, neuropathy, gastroesophageal reflux disease (GERD), history of malignant neoplasm of the breast, hearing loss, hypothyroidism, implanted defibrillator, interstitial pulmonary disease, lipoma of the thigh, lumbar post-laminectomy syndrome, lymphedema of the limb, dementia without behavioral disturbance, mixed hyperlipidemia, nerve pain due to spinal stenosis, overactive urinary bladder, piriformis syndrome, sarcoidosis, sciatica, seasonal allergies, and seasonal purpura presenting with complaints of left eye drainage that began this morning and vaginal discharge and increased urinary frequency beginning 12/18/2023. She is accompanied by her daughter.  The patient reports an unusual vaginal  discharge, which she describes as white with a pinkish hue, and occasionally yellow. The discharge is not clumpy and has a slight odor. She also notes that her urine was darker and cloudy last night. She experiences burning during urination and soreness in the vaginal area, particularly around the clitoris. She has reduced her fluid intake due to frequent urination, which occurred 8 times between Thursday night and Friday morning. She has been using Emla  cream and another vaginal cream for pain relief. She applies estradiol  cream twice a week in the morning and lidocaine -prilocaine  cream at night. She has not used the numbing cream since starting catheterization to prevent infection. She has a history of genital herpes.  The patient woke up this morning with a swollen upper eyelid on her left eye, which is causing her slight pain. She has had a stye before.  The patient has been drinking less than usual due to leg swelling. She weighs herself daily at the same time without clothes, and her weight has remained stable at 114.5 pounds. She takes Lasix  as needed but has not taken it recently. She reports no shortness of breath or chest pain. Her defibrillator has not shocked her recently.  PAST SURGICAL HISTORY: Total revision of left knee arthroscopy.    Review of Systems: All others reviewed and negative except as listed above. Vitals:   12/21/23 1024  BP: 115/71  BP Location: Left arm  Patient Position: Sitting  Pulse: 79  Resp: 16  Temp: 97.9 F (36.6 C)  TempSrc: Oral  SpO2: 98%  Weight: 52.2 kg (115 lb)  Height: 1.473 m (4' 10)   Physical Exam General Appearance: WN, WD NAD nontoxic or nonlethargic in appearance. Vital signs: Within normal limits. Head: Normocephalic, Atraumatic. Eyes: Left upper eyelid swollen with slight pain, likely starting to develop a stye. Ears: No obvious otorrhea. Nose: No obvious  rhinorrhea. Mouth/Throat: Pink and Moist. Lymphatic: No Cervical,  Supraclavicular Lymphadenopathy noted. Neck: FROM, No nuchal rigidity noted. Respiratory: Lungs are clear to auscultation bilaterally. No rales, rhonchi or wheezing. Cardiovascular: Heart has a regular rate and rhythm. Gastrointestinal: Abdominal exam reveals positive bowel sounds. No pulsatile masses noted. No bruits noted. Abdomen is nontender and nondistended. Genitourinary: Female: Vaginal exam reveals a vesicle in the right labia majora on the region roof. There is excoriation with a vesicle lower and some swelling in this area. Back, Musculoskeletal: No CVA tenderness. Extremities: 2+ pitting edema to mid shin and trace edema up to knees. Good cap refill. Skin: Warm and dry, no rash. Neurological: Alert and oriented x3, Speech clear/fluent good understanding Face symmetrical. Psychiatric: Affect is normal.        Recent Results (from the past 12 hours)  POC Urinalysis Auto without Microscopic   Collection Time: 12/21/23 12:22 PM  Result Value Ref Range   Color, Urine Yellow Yellow   Clarity, Urine Cloudy (A) Clear   Glucose, Urine Negative Negative mg/dL   Bilirubin, Urine Negative Negative   Ketones, Urine Negative Negative mg/dL   Specific Gravity, Urine 1.015 1.010, 1.015, 1.020, 1.025   Blood, Urine Negative Negative   pH, Urine 7.5 5.0, 5.5, 6.0, 6.5, 7.0, 7.5, 8.0   Protein, Urine Negative Negative mg/dL   Urobilinogen, Urine 0.2 <2.0 mg/dL   Nitrite, Urine Negative Negative   Leukocyte Esterase, Urine Negative Negative   Kit/Device Lot # [id number]    Kit/Device Expiration Date 04/08/2024         Assessment & Plan Initial Assessment: Increased vaginal discharge and soreness, left eyelid swelling, increased urinary frequency, burning during urination, history of genital herpes.  Differential Diagnosis: - Interstitial cystitis: Low suspicion. - Urethral trauma: Low suspicion. - Pyelonephritis: Low suspicion. - Renal stones: No colicky pain. - STDs: Unlikely. -  Medication side effects: Considered. - Gonococcal conjunctivitis: Low suspicion. - Corneal foreign body: Low suspicion. - Herpes keratitis: Low suspicion. - Corneal ulcers/laceration: Low suspicion. - Bacterial/viral/allergic conjunctivitis: Low suspicion. - Blepharitis: Low suspicion. - Orbital and periorbital cellulitis: Low suspicion. - Episcleritis: Low suspicion.  Urgent Care Course: - Vaginal exam performed. - Swab obtained for BV and yeast. - Urine sample collected. - Valtrex  prescribed for herpetic outbreak. - Erythromycin ointment prescribed for left eyelid. - CMP reviewed (11/20/2023): eGFR 68, LFTs normal. - Abdominal exam: Positive bowel sounds, no pulsatile masses, no bruits, non-tender, non-distended. - Lungs clear to auscultation bilaterally. - Heart regular rate, rhythm. - 2+ pitting edema to mid shin, trace edema up to knees. - Patient catheterized.  Final Assessment: Vaginal discharge and soreness likely due to herpetic outbreak. Left eyelid swelling likely due to hordeolum. Increased urinary frequency and burning during urination likely due to UTI. Valtrex  and erythromycin ointment prescribed. Urine sample and BV panel obtained for further analysis.  Clinical Impression: - Herpetic outbreak. - Hordeolum. - Urinary tract infection.  Disposition: - Follow-Up: BV panel results. Call patient with results. Prescribe medication if needed.  Patient Education:  Valtrex  500 mg twice daily for 3 days. Erythromycin ointment for left eyelid twice daily. Explanation of yeast infection due to antibiotics.  PROCEDURE Procedure: Vaginal exam - Procedural Discussion: Discussed the need for a vaginal exam to check for sores and discharge. Patient consented to the exam. - Technique: Performed vaginal exam, noted vesicle in the right labia majora, excoriation with a vesicle lower, and swelling in the area. - Post-Procedural Discussion: Prescribed Valtrex  for active herpetic  outbreak. Discussed  the potential for yeast infection due to antibiotics and the need for urine analysis.   Procedure: Catheterization - Procedural Discussion: Discussed the need for catheterization due to patient's inability to urinate and residual urine volume. - Technique: Performed catheterization to relieve urinary retention. - Post-Procedural Discussion: Patient's urine appeared cloudy but showed no signs of infection. Advised continuation of catheterization if tolerated.     Red Flags associated with diagnosis/es were reviewed, action plan instructions given. Strict return Precautions given. Patient (and Family if applicable) agreed with plan and voiced understanding.  No barriers to adherence perceived by myself. Portions of this note may have been dictated using Psychologist, clinical.  Disposition: Follow up with PCP and urogyn   Electronically signed: Scarlette Vicci Needle, PA-C  12/21/2023 12:33 PM         [1] Past Medical History: Diagnosis Date  . Depression   . GERD (gastroesophageal reflux disease)   . Hearing loss   . Herpes   . Hypertension   . Thyroid activity decreased   [2] Patient Active Problem List Diagnosis  . Status post total bilateral knee replacement  . Age-related osteoporosis without current pathological fracture  . Arteriosclerosis of abdominal aorta  . Atherosclerosis of native arteries of extremity with intermittent claudication  . Bipolar II disorder    (CMD)  . Breast cancer of lower-outer quadrant of left female breast    (CMD)  . Cerebral vascular accident    (CMD)  . Chronic low back pain  . Chronic systolic heart failure    (CMD)  . Congestive heart failure    (CMD)  . Continuous leakage of urine  . Depression  . Vertigo  . Dyslipidemia  . Essential hypertension  . Essential tremor  . Neuropathy  . Gastroesophageal reflux disease  . History of breast cancer  . History of hearing loss  .  Hypothyroidism  . ICD (implantable cardioverter-defibrillator), biventricular, in situ  . Interstitial pulmonary disease    (CMD)  . Lipoma of thigh  . Lumbar post-laminectomy syndrome  . Lymphedema of limb  . Dementia without behavioral disturbance (CMD)  . Mixed hyperlipidemia  . Nerve pain due to spinal stenosis  . Overactive bladder  . Piriformis syndrome of both sides  . Sarcoidosis  . Sciatica associated with disorder of multiple sites of spine  . Seasonal allergies  . Senile purpura  . Spinal stenosis of lumbar region with neurogenic claudication  . Status post revision of total replacement of left knee

## 2023-12-24 NOTE — Telephone Encounter (Signed)
 Patient called in regards to her test results. States she had a missed call from Alan LITTIE PEAK. Patient was verified by x3 identifiers. Patient is advised that her urine culture came back positive for a UTI. The provider sent in Macrobid for UTI, patient verbalized understanding.

## 2024-01-08 ENCOUNTER — Ambulatory Visit (INDEPENDENT_AMBULATORY_CARE_PROVIDER_SITE_OTHER): Admitting: Obstetrics and Gynecology

## 2024-01-08 ENCOUNTER — Other Ambulatory Visit: Payer: Self-pay | Admitting: Obstetrics and Gynecology

## 2024-01-08 ENCOUNTER — Encounter: Payer: Self-pay | Admitting: Obstetrics and Gynecology

## 2024-01-08 VITALS — BP 119/73 | HR 73

## 2024-01-08 DIAGNOSIS — R300 Dysuria: Secondary | ICD-10-CM | POA: Diagnosis not present

## 2024-01-08 DIAGNOSIS — N898 Other specified noninflammatory disorders of vagina: Secondary | ICD-10-CM

## 2024-01-08 DIAGNOSIS — A609 Anogenital herpesviral infection, unspecified: Secondary | ICD-10-CM | POA: Diagnosis not present

## 2024-01-08 DIAGNOSIS — R350 Frequency of micturition: Secondary | ICD-10-CM

## 2024-01-08 DIAGNOSIS — R3989 Other symptoms and signs involving the genitourinary system: Secondary | ICD-10-CM | POA: Diagnosis not present

## 2024-01-08 LAB — POCT URINALYSIS DIPSTICK
Bilirubin, UA: NEGATIVE
Glucose, UA: NEGATIVE
Ketones, UA: NEGATIVE
Nitrite, UA: POSITIVE
Protein, UA: POSITIVE — AB
Spec Grav, UA: 1.01 (ref 1.010–1.025)
Urobilinogen, UA: 0.2 U/dL
pH, UA: 7 (ref 5.0–8.0)

## 2024-01-08 MED ORDER — FLUCONAZOLE 150 MG PO TABS: 150.0000 mg | ORAL_TABLET | Freq: Once | ORAL | 0 refills | Status: AC

## 2024-01-08 MED ORDER — GENTAMICIN SULFATE 40 MG/ML IJ SOLN
80.0000 mg | Freq: Once | INTRAMUSCULAR | Status: AC
  Administered 2024-01-08: 80 mg via INTRAMUSCULAR

## 2024-01-08 MED ORDER — LIDOCAINE HCL 2 % IJ SOLN
20.0000 mL | Freq: Once | INTRAMUSCULAR | Status: AC
  Administered 2024-01-08: 400 mg

## 2024-01-08 MED ORDER — SODIUM BICARBONATE 8.4 % IV SOLN
5.0000 mL | Freq: Once | INTRAVENOUS | Status: AC
  Administered 2024-01-08: 5 mL

## 2024-01-08 MED ORDER — LIDOCAINE HCL URETHRAL/MUCOSAL 2 % EX GEL: 1.0000 | CUTANEOUS | 5 refills | Status: DC | PRN

## 2024-01-08 MED ORDER — VALACYCLOVIR HCL 1 G PO TABS: 1000.0000 mg | ORAL_TABLET | Freq: Every day | ORAL | 1 refills | Status: AC

## 2024-01-08 MED ORDER — NITROFURANTOIN MONOHYD MACRO 100 MG PO CAPS: 100.0000 mg | ORAL_CAPSULE | Freq: Two times a day (BID) | ORAL | 0 refills | Status: AC

## 2024-01-08 MED ORDER — HEPARIN SODIUM (PORCINE) 10000 UNIT/ML IJ SOLN
10000.0000 [IU] | Freq: Once | INTRAMUSCULAR | Status: AC
  Administered 2024-01-08: 10000 [IU] via INTRAVESICAL

## 2024-01-08 MED ORDER — BUPIVACAINE HCL 0.25 % IJ SOLN
20.0000 mL | Freq: Once | INTRAMUSCULAR | Status: AC
  Administered 2024-01-08: 20 mL

## 2024-01-08 NOTE — Patient Instructions (Addendum)
 Please start Valtrex  1000mg  daily for suppression of the herpetic lesions.    Start Macrobid for the urinary tract infection.   Start the lidocaine  jelly in the urethral/vaginal region.   At this time I do not think you need to self cath.

## 2024-01-08 NOTE — Progress Notes (Unsigned)
 [facility] Return Visit  SUBJECTIVE  History of Present Illness: [name] is a 89 y.o. female seen for urethral pain/burning, urinary frequency, and incontinence. Had intravesical botox  on 09/26/23. Also had second urethral bulking on 05/16/23.   Patient reports she was recently diagnosed with a UTI and had a flare of genital herpes. She was treated with valcyclovir and antibiotics at [facility].   Today she reports significant urethral pain, feeling like she is not emptying her bladder, but also pain in the lower pelvic region and pressure.    Past Medical History: Patient  has a past medical history of Arthritis, Cancer (HCC), Cataracts, bilateral, CHF (congestive heart failure) (HCC), Complication of anesthesia, Depression, Dyslipidemia, Fainted (04/21/06), GERD (gastroesophageal reflux disease), Headache(784.0), Hearing loss, HLD (hyperlipidemia), Hypertension, Hypothyroidism, ICD (implantable cardiac defibrillator) in place, ICD (implantable cardiac defibrillator), biventricular, in situ, LBBB (left bundle branch block), Memory loss, Nonischemic cardiomyopathy (HCC), Normal coronary arteries, Pacemaker, Syncope, Systolic CHF (HCC), Vertigo, and Wears glasses.   Past Surgical History: She  has a past surgical history that includes Total knee arthroplasty (05/17/01); Lumbar fusion (2006); Mastectomy, partial (2008); Breast surgery (2000); Joint replacement (06/14/01); Pacemaker insertion (04/23/06); Back surgery; Cardiac catheterization; Mass excision (11/08/2011); implantable cardioverter defibrillator generator change (N/A, 12/18/2012); Cataract extraction; Eye surgery; Total knee arthroplasty (Left, 11/29/2014); Breast lumpectomy (Left, 2008); Colonoscopy; Mastectomy w/ sentinel node biopsy (Left, 06/04/2019); BIV ICD GENERATOR CHANGEOUT (N/A, 02/06/2023); and IR INJECT DIAG/THERA/INC NEEDLE/CATH/PLC EPI/LUMB/SAC W/IMG (06/28/2023).   Medications: She has a current medication list which  includes the following prescription(s): albuterol , ascorbic acid, buprenorphine , bupropion , buspirone , carvedilol , vitamin d3, clobetasol  propionate e, diclofenac  sodium, diphenhydramine , duloxetine, estradiol , famotidine, furosemide , levothyroxine , lidocaine , lidocaine , lubiprostone , magnesium , metaxalone , pumpkin seed  oil, naltrexone  hcl (pain), nitrofurantoin (macrocrystal-monohydrate), NONFORMULARY OR COMPOUNDED ITEM, fish oil, omeprazole , systane, polyethylene glycol 3350 , pregabalin , senna-docusate, spironolactone , telmisartan , turmeric, valacyclovir , vitamin e, fluconazole , hypromellose, and gemtesa , and the following Facility-Administered Medications: denosumab .   Allergies: Patient is allergic to iodinated contrast media, iodine , shellfish allergy, strawberry extract, memantine , aspirin , and codeine.   Social History: Patient  reports that she has never smoked. She has never been exposed to tobacco smoke. She has never used smokeless tobacco. She reports that she does not drink alcohol and does not use drugs.     OBJECTIVE    Lab Results  Component Value Date   COLORU yellow 01/08/2024   CLARITYU cloudy 01/08/2024   GLUCOSEUR Negative 01/08/2024   BILIRUBINUR Negative 01/08/2024   KETONESU Negative 01/08/2024   SPECGRAV 1.010 01/08/2024   RBCUR Large 01/08/2024   PHUR 7.0 01/08/2024   PROTEINUR Positive (A) 01/08/2024   UROBILINOGEN 0.2 01/08/2024   LEUKOCYTESUR Large (3+) (A) 01/08/2024     Physical Exam: Vitals:   01/08/24 1053  BP: 119/73  Pulse: 73   Gen: No apparent distress, A&O x 3.  Detailed Urogynecologic Evaluation:  Clitoral region shows active herpetic outbreak with one spot of active flare on each vulva that was not previously there when Dr. Marilynne saw her. Vaginal tissues are red and irritated with urethral caruncle noted on urethra. No active bleeding, mass, or other d/c noted on exam.    Bladder Instillation: The patient was identified and verbally  consented for the procedure.  The urethra was prepped with Betadine  x 3. A 16 Fr foley catheter was inserted the bladder and drained for 150cc. The foley was then attached to a 60 mL syringe with the plunger removed.  The medication was slowly poured into the bladder via the  syringe and foley.  The medication consisted of: 20ml of Lidocaine  2%, 20mL, and 80mg /6ml Gentamicin , of Bupivicaine 0.25%, 10,000 units/mL Heparin, 5mL Sodium Bicarbonate 8.4%.  The foley was removed and the patient was asked to hold the liquids in her bladder for 30-60 minutes if possible.      ASSESSMENT AND PLAN    [name] is a 89 y.o. with:  1. Bladder pain [R39.89]   2. HSV (herpes simplex virus) anogenital infection   3. Vaginal irritation   4. Dysuria   5. Urinary frequency    Bladder instillation with Gentamicin  completed today so that patient would have some immediate relief of her bladder irritation symptoms and treat underlying infection.  Patient to start valtrex  suppression 1000mg  daily for HSV due to outbreak and spread.   Diflucan  sent in for patient to have in case antibiotics cause yeast infection. Will also start patient on Macrobid to treat UTI based on prior culture. Will send for pathnostics to rule out yeast or other infections in bladder.  Focus on getting infections/HSV outbreak under control first then will work more on bladder support. Can continue Trospium  and pumpkin seed  extract.   Patient to return in 4 weeks or sooner if needed.   Arda Keadle G Haruki Arnold, NP

## 2024-01-08 NOTE — Progress Notes (Signed)
 Remote ICD Transmission

## 2024-01-09 ENCOUNTER — Ambulatory Visit

## 2024-01-09 ENCOUNTER — Ambulatory Visit: Attending: Internal Medicine | Admitting: Internal Medicine

## 2024-01-09 ENCOUNTER — Encounter: Payer: Self-pay | Admitting: Internal Medicine

## 2024-01-09 VITALS — BP 135/72 | HR 66 | Temp 97.4°F | Resp 12 | Ht 59.0 in | Wt 119.0 lb

## 2024-01-09 DIAGNOSIS — J849 Interstitial pulmonary disease, unspecified: Secondary | ICD-10-CM

## 2024-01-09 DIAGNOSIS — Z96653 Presence of artificial knee joint, bilateral: Secondary | ICD-10-CM | POA: Diagnosis not present

## 2024-01-09 DIAGNOSIS — M19041 Primary osteoarthritis, right hand: Secondary | ICD-10-CM

## 2024-01-09 DIAGNOSIS — R413 Other amnesia: Secondary | ICD-10-CM

## 2024-01-09 DIAGNOSIS — M19042 Primary osteoarthritis, left hand: Secondary | ICD-10-CM

## 2024-01-09 DIAGNOSIS — R7689 Other specified abnormal immunological findings in serum: Secondary | ICD-10-CM

## 2024-01-09 DIAGNOSIS — C50512 Malignant neoplasm of lower-outer quadrant of left female breast: Secondary | ICD-10-CM

## 2024-01-09 DIAGNOSIS — M48062 Spinal stenosis, lumbar region with neurogenic claudication: Secondary | ICD-10-CM

## 2024-01-09 DIAGNOSIS — E559 Vitamin D deficiency, unspecified: Secondary | ICD-10-CM

## 2024-01-09 NOTE — Progress Notes (Signed)
 Office Visit Note  Patient: [name]             Date of Birth: 1933-04-15           MRN: [id number]             PCP: [name], NP Referring: [name], MD Visit Date: 01/09/2024 Occupation: Data Unavailable  Subjective:  New Patient (Initial Visit) (Increased joint pain. Abnormal labs. Rule out RA)   Discussed the use of AI scribe software for clinical note transcription with the patient, who gave verbal consent to proceed.  History of Present Illness   [name] is an 89 year old female with a history of breast cancer and arthritis who presents for evaluation of lung nodules and possible sarcoidosis. She is accompanied by her daughter. She was referred by her lung doctor for her abnormal labs as part of evaluation of lung nodules and possible sarcoidosis.  Lung nodules were identified on recent imaging, prompting further evaluation. She has no symptoms of coughing or shortness of breath.  She has a history of breast cancer treated with radiation starting in 2008, with a recurrence in 2011, all on the left side.  She has longstanding arthritis affecting her back, hands, and knees. She underwent spinal fusion surgery and bilateral knee replacements. She experiences persistent pain in her back and hands, with difficulty gripping and opening objects due to pain, but no numbness. Her legs have pain and swelling, with a sensation of tightness in her feet. The swelling is intermittent and non-pruritic.  Her past medical history includes low thyroid function for which she takes thyroid medication and a vitamin D  supplement.   Denies oral or nasal ulcers, skin rashes, raynaud's symptoms. No abnormal bruising or bleeding or blood clots history.      Labs reviewed ANA 1:320 homogenous ENA pnl neg RF neg CCP neg ACE wnl ANCAs neg ESR wnl CRP wnl   Activities of Daily Living:  Patient reports morning stiffness for 30-60 minutes.   Patient  Reports nocturnal pain.  Difficulty dressing/grooming: Reports Difficulty climbing stairs: Reports Difficulty getting out of chair: Denies Difficulty using hands for taps, buttons, cutlery, and/or writing: Reports  Review of Systems  Constitutional:  Positive for fatigue.  HENT:  Positive for mouth dryness. Negative for mouth sores.   Eyes:  Positive for dryness.  Respiratory:  Negative for shortness of breath.   Cardiovascular:  Negative for chest pain and palpitations.  Gastrointestinal:  Negative for blood in stool, constipation and diarrhea.  Endocrine: Positive for increased urination.  Genitourinary:  Positive for involuntary urination.  Musculoskeletal:  Positive for joint pain, gait problem, joint pain, muscle weakness, morning stiffness and muscle tenderness. Negative for joint swelling, myalgias and myalgias.  Skin:  Positive for color change. Negative for rash, hair loss and sensitivity to sunlight.  Allergic/Immunologic: Negative for susceptible to infections.  Neurological:  Positive for headaches. Negative for dizziness.  Hematological:  Negative for swollen glands.  Psychiatric/Behavioral:  Positive for depressed mood. Negative for sleep disturbance. The patient is nervous/anxious.     PMFS History:  Patient Active Problem List   Diagnosis Date Noted   Positive ANA (antinuclear antibody) 01/09/2024   Vitamin D  deficiency 01/09/2024   Spinal stenosis of lumbar region with neurogenic claudication 08/28/2023   Nerve pain due to spinal stenosis 08/28/2023   Piriformis syndrome of both sides 01/30/2023   Neuropathy 08/01/2022   Atherosclerosis of native artery of both lower extremities with  rest pain (HCC) 07/09/2022   Interstitial pulmonary disease (HCC) 07/09/2022   Sciatica associated with disorder of multiple sites of spine 03/20/2021   Bipolar II disorder (HCC) 03/20/2021   Chronic pain syndrome 03/20/2021   Urinary retention 02/04/2020   Seasonal allergies  02/04/2020   Recurrent major depressive disorder, in partial remission 07/17/2019   Status post left mastectomy 07/01/2019   Breast cancer of lower-outer quadrant of left female breast (HCC) 06/04/2019   Candida infection, oral 01/15/2019   Senile purpura 04/14/2018   Lumbar post-laminectomy syndrome 12/03/2017   Lumbar spondylosis 12/03/2017   History of back surgery 09/01/2017   Constipation 09/01/2017   Hypothyroidism due to acquired atrophy of thyroid 09/01/2017   Mixed hyperlipidemia 09/01/2017   Age-related osteoporosis without current pathological fracture 09/01/2017   High risk medication use 09/01/2017   Arthritis of hand 05/17/2017   Atherosclerosis of native arteries of extremity with intermittent claudication 05/15/2017   Status post total bilateral knee replacement 12/20/2016   Gait abnormality 07/16/2016   Chronic low back pain 07/16/2016   Mild cognitive impairment 07/16/2016   Wrist pain 05/10/2015   S/P left TKA 11/29/2014   S/P knee replacement 11/29/2014   Spontaneous bruising 08/27/2014   Numbness and tingling in right hand 07/28/2014   Essential tremor 07/28/2014   Dizziness 05/28/2014   Shaky 05/28/2014   Memory loss 05/28/2014   Depression 05/06/2014   Lipoma of left upper thigh 3x5 cm 09/28/2011   Cerebral vascular accident (HCC) 08/09/2011   Syncope 08/09/2011   History of breast cancer [id number], s/p BCT 2008, triple negative 01/26/2011   Chronic L breast pain with chronic recurrent seroma, s/p excisional biopsy 01/12/2010 01/26/2011   Fainted    ICD (implantable cardioverter-defibrillator), biventricular, in situ 08/02/2010   Chronic systolic heart failure (HCC) 08/02/2010   Essential hypertension 08/02/2010    Past Medical History:  Diagnosis Date   Arthritis    Cancer (HCC)    left breast cancer    Cataracts, bilateral    removed by surgery   CHF (congestive heart failure) (HCC)    PACEMAKER & DEFIB   Complication of anesthesia     hypotensive after back surgery in 2006   Depression    Dyslipidemia    Fainted 04/21/06   AT [facility]   GERD (gastroesophageal reflux disease)    Headache(784.0)    Hearing loss    bilateral hearing aids   HLD (hyperlipidemia)    diet controlled    Hypertension    Hypothyroidism    ICD (implantable cardiac defibrillator) in place    pt has pacer/icd   ICD (implantable cardiac defibrillator), biventricular, in situ    LBBB (left bundle branch block)    Memory loss    Nonischemic cardiomyopathy (HCC)    Normal coronary arteries    s/p cardiac cath 2007   Pacemaker    ICD [employer]   Syncope    Systolic CHF [facility])    Vertigo    Wears glasses     Family History  Problem Relation Age of Onset   Hypertension Mother    Arthritis Mother    Hypertension Father    Hypertension Brother    Hypertension Brother    Fibroids Daughter    Past Surgical History:  Procedure Laterality Date   BACK SURGERY     lumbar fusion    BIV ICD GENERATOR CHANGEOUT N/A 02/06/2023   Procedure: BIV ICD GENERATOR CHANGEOUT;  Surgeon: [name], MD;  Location: [facility] INVASIVE  CV LAB;  Service: Cardiovascular;  Laterality: N/A;   BREAST LUMPECTOMY Left 2008   BREAST SURGERY  2000   LUMP REMOVAL. STAGE 1 CANCER   CARDIAC CATHETERIZATION     CATARACT EXTRACTION     COLONOSCOPY     EYE SURGERY     IMPLANTABLE CARDIOVERTER DEFIBRILLATOR GENERATOR CHANGE N/A 12/18/2012   Procedure: IMPLANTABLE CARDIOVERTER DEFIBRILLATOR GENERATOR CHANGE;  Surgeon: Danelle LELON Birmingham, MD;  Location: [facility] CATH LAB;  Service: Cardiovascular;  Laterality: N/A;   IR INJECT/THERA/INC NEEDLE/CATH/PLC EPI/LUMB/SAC W/IMG  06/28/2023   JOINT REPLACEMENT  06/14/01   right   LUMBAR FUSION  2006   MASS EXCISION  11/08/2011   Procedure: EXCISION MASS;  Surgeon: Jina Nephew, MD;  Location: [facility] ORS;  Service: General;  Laterality: Left;  Excision Left Thigh Mass   MASTECTOMY W/ SENTINEL NODE BIOPSY Left 06/04/2019   Procedure: LEFT  MASTECTOMY WITH SENTINEL LYMPH NODE BIOPSY;  Surgeon: Nephew Jina, MD;  Location: [facility] OR;  Service: General;  Laterality: Left;   MASTECTOMY, PARTIAL  2008   GOT PACEMAKER AND DEFIB AT THAT TIME   PACEMAKER INSERTION  04/23/06   TOTAL KNEE ARTHROPLASTY  05/17/01   RIGHT KNEE   TOTAL KNEE ARTHROPLASTY Left 11/29/2014   Procedure: TOTAL LEFT KNEE ARTHROPLASTY;  Surgeon: Donnice Car, MD;  Location: [facility] ORS;  Service: Orthopedics;  Laterality: Left;   Social History   Tobacco Use   Smoking status: Never    Passive exposure: Never   Smokeless tobacco: Never  Vaping Use   Vaping status: Never Used  Substance Use Topics   Alcohol use: No   Drug use: No   Social History   Social History Narrative   Lives at home with husband.   Right-handed.      As of 07/28/2014   Diet: No special diet   Caffeine: yes, Chocolate, tea and sodas    Married: YES, 1970   House: Yes, 2 stories, 2-3 persons live in home   Pets: No   Current/Past profession: Nurse, mental health, Nurse Practitioner    Exercise: Yes 2-3 x weekly   Living Will: Yes   DNR: No   POA/HPOA: No        Immunization History  Administered Date(s) Administered   Fluad Quad(high Dose 65+) 01/28/2020, 01/17/2021   INFLUENZA, HIGH DOSE SEASONAL PF 01/16/2018, 12/08/2018, 01/12/2022, 01/18/2023   Influenza-Unspecified 12/08/2013, 01/10/2015, 12/09/2015, 01/12/2017, 01/17/2021   PFIZER(Purple Top)SARS-COV-2 Vaccination 05/23/2019, 06/15/2019, 12/23/2019, 08/30/2020   PNEUMOCOCCAL CONJUGATE-20 01/05/2022   Pfizer Covid-19 Vaccine Bivalent Booster 59yrs & up 02/03/2021, 01/12/2022   Pfizer(Comirnaty)Fall Seasonal Vaccine 12 years and older 12/24/2022   Pneumococcal Conjugate-13 04/09/2012   Pneumococcal Polysaccharide-23 02/28/2016   Respiratory Syncytial Virus Vaccine,Recomb Aduvanted(Arexvy) 01/05/2022   Tdap 01/07/2013   Zoster Recombinant(Shingrix) 02/03/2021, 06/26/2021   Zoster, Live 08/05/2014, 02/03/2021     Objective: Vital  Signs: BP 135/72 (BP Location: Right Arm, Patient Position: Sitting, Cuff Size: Small)   Pulse 66   Temp (!) 97.4 F (36.3 C)   Resp 12   Ht 4' 11 (1.499 m)   Wt 119 lb (54 kg)   LMP  (LMP Unknown)   BMI 24.04 kg/m    Physical Exam Eyes:     Conjunctiva/sclera: Conjunctivae normal.  Cardiovascular:     Rate and Rhythm: Normal rate and regular rhythm.  Pulmonary:     Effort: Pulmonary effort is normal.     Comments: Inspiratory crackles multiple fields louder over RLL Lymphadenopathy:     Cervical:  No cervical adenopathy.  Skin:    General: Skin is warm and dry.     Findings: No rash.     Comments: Trace pedal edema Hyperpigmentation changes in lower shins, faint petechiae present  Neurological:     Mental Status: She is alert.  Psychiatric:        Mood and Affect: Mood normal.      Musculoskeletal Exam:  Shoulders full ROM no tenderness or swelling Dextroscoliosis, midline and paraspinal muscle tenderness to pressure b/l Elbows full ROM no tenderness or swelling Left wrist mild swelling, pain with pressure, not much with movement Fingers full ROM no tenderness or swelling, heberdons nodes Knees postop changes, medial > lateral joint line tenderness to pressure and some with full flexion Ankles full ROM no tenderness or swelling     Investigation: No additional findings.  Imaging: No results found.  Recent Labs: Lab Results  Component Value Date   WBC 9.0 08/19/2023   HGB 12.4 08/19/2023   PLT 135 (L) 08/19/2023   NA 135 11/20/2023   K 4.3 11/20/2023   CL 98 11/20/2023   CO2 29 11/20/2023   GLUCOSE 93 11/20/2023   BUN 16 11/20/2023   CREATININE 0.83 11/20/2023   BILITOT 0.5 11/20/2023   ALKPHOS 39 08/19/2023   AST 21 11/20/2023   ALT 17 11/20/2023   PROT 6.9 11/20/2023   ALBUMIN 4.0 08/19/2023   CALCIUM 10.3 11/20/2023   GFRAA 62 09/28/2020    Speciality Comments: No specialty comments available.  Procedures:  No procedures  performed Allergies: Iodinated contrast media, Iodine , Shellfish allergy, Strawberry extract, Memantine , Aspirin , and Codeine   Assessment / Plan:     Visit Diagnoses: Positive ANA (antinuclear antibody) Interstitial pulmonary disease (HCC) - Plan: C3 and C4, Sedimentation rate, CK Lung nodules on CT with differential of sarcoidosis or pulmonary fibrosis. Positive ANA suggests autoimmune activity. No systemic autoimmune disease evident. Discussed potential long-term consequences of untreated lung disease. Potential effect of previous chest radiation, sequelae of past autoimmune disease activity. - Order additional blood tests for autoimmune activity  - Check vitamin D  levels for CGD/sarcoidosis-related effects.  Arthritis of both hands - Plan: XR Hand 2 View Right, XR Hand 2 View Left Carpal tunnel syndrome, status post surgery, with left wrist swelling Post carpal tunnel surgery with left wrist swelling. Pain noted. Differential includes joint inflammation or osteoarthritis.Chronic osteoarthritis with bilateral knee replacements. Hand pain and reduced grip strength. Possible osteoarthritis in swollen left wrist. - Order bilateral wrist X-rays for joint inflammation or osteoarthritis damage.  Status post total bilateral knee replacement  Spinal stenosis of lumbar region with neurogenic claudication Chronic low back pain with dextroscoliosis, post spinal fusion Chronic low back pain with dextroscoliosis, post spinal fusion. Pain likely due to muscle imbalance and structural changes.  Malignant neoplasm of lower-outer quadrant of left female breast, unspecified estrogen receptor status (HCC)  Vitamin D  deficiency - Plan: VITAMIN D  25 Hydroxy (Vit-D Deficiency, Fractures), Vitamin D  1,25 dihydroxy  Chronic venous insufficiency with stasis dermatitis and lower extremity edema Chronic venous insufficiency with stasis dermatitis and edema. Swelling causes leg tightness and tenderness. Petechiae  and skin discoloration noted. - Advise elevating feet when sitting. - Consider topical steroids for symptomatic relief.    Orders: Orders Placed This Encounter  Procedures   XR Hand 2 View Right   XR Hand 2 View Left   C3 and C4   Sedimentation rate   VITAMIN D  25 Hydroxy (Vit-D Deficiency, Fractures)   Vitamin D  1,25  dihydroxy   CK   No orders of the defined types were placed in this encounter.   Follow-Up Instructions: No follow-ups on file.   Lonni LELON Ester, MD  Note - This record has been created using [employer].  Chart creation errors have been sought, but may not always  have been located. Such creation errors do not reflect on  the standard of medical care.

## 2024-01-13 ENCOUNTER — Encounter: Payer: Self-pay | Admitting: Obstetrics and Gynecology

## 2024-01-14 LAB — VITAMIN D 1,25 DIHYDROXY
Vitamin D 1, 25 (OH)2 Total: 78 pg/mL — ABNORMAL HIGH (ref 18–72)
Vitamin D2 1, 25 (OH)2: <8 pg/mL
Vitamin D3 1, 25 (OH)2: 78 pg/mL

## 2024-01-14 LAB — VITAMIN D 25 HYDROXY (VIT D DEFICIENCY, FRACTURES): Vit D, 25-Hydroxy: 36 ng/mL (ref 30–100)

## 2024-01-14 LAB — SEDIMENTATION RATE: Sed Rate: 25 mm/h (ref 0–30)

## 2024-01-14 LAB — C3 AND C4
C3 Complement: 157 mg/dL
C4 Complement: 32 mg/dL

## 2024-01-14 LAB — CK: Total CK: 73 U/L (ref 16–215)

## 2024-01-15 ENCOUNTER — Ambulatory Visit: Payer: Self-pay | Admitting: Obstetrics and Gynecology

## 2024-01-15 NOTE — Telephone Encounter (Signed)
 The pharmacy has changed it to a 5% ointment.

## 2024-01-20 ENCOUNTER — Other Ambulatory Visit: Payer: Self-pay | Admitting: Obstetrics and Gynecology

## 2024-01-27 ENCOUNTER — Encounter: Payer: Self-pay | Admitting: Physical Medicine and Rehabilitation

## 2024-02-03 ENCOUNTER — Ambulatory Visit: Admitting: Nurse Practitioner

## 2024-02-04 NOTE — Progress Notes (Incomplete)
 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 RH female with a history ofhypertension, hyperlipidemia, hypothyroidism, LBBB status post ICD placement, breast cancer status postmastectomy, chronic back pain, bipolar disorder, depression, anxiety, and mild dementia likely due to Alzheimer's disease seen today in follow up for memory loss. Patient is currently no longer on the dementia medication due to undesirable side effects and is not interested in trying galantamine.  She is independent with her ADLs and attends ADP 3 times a week.  In today's visit memory is***with MMSE/30 no longer drives.  Mood is good.       Follow up in   months. Continue attending [facility] at [facility] Recommend good control of her cardiovascular risk factors Continue to control mood as per PCP     Subjective:    This patient is accompanied in the office by her daughter*** who supplements the history.  Previous records as well as any outside records available were reviewed prior to todays visit. Patient was last seen on 08/01/2023 with MMSE 30/30***   Any changes in memory since last visit?  repeats oneself?  Endorsed Disoriented when walking into a room? Denies ***  Leaving objects?  May misplace things but not in unusual places***  Wandering behavior?  denies   Any personality changes since last visit?  Denies.   Any worsening depression?:  She has some down moments .  She sees psychiatry Hallucinations or paranoia?  Denies.   Seizures? denies    Any sleep changes?  Does not sleep very well because she has to get up to urinate.***Denies vivid dreams, REM behavior or sleepwalking   Sleep apnea?   Denies.   Any hygiene concerns? Denies.  Independent of bathing and dressing?  Endorsed  Does the patient needs help with medications?   Patient is in charge *** Who is in charge of the finances?  Daughter is in charge   *** Any  changes in appetite?  denies ***   Patient have trouble swallowing? Denies.   Does the patient cook? No Any headaches?   denies   Any vision changes?*** Chronic back pain endorsed, did PT, seen by [facility] Ambulates with difficulty? Denies.  Enjoys chair Zumba, uses a cane for stability (she calls it [place])*** Recent falls or head injuries? Denies.     Unilateral weakness, numbness or tingling? denies   Any tremors?  Denies  *** Any anosmia?  Denies   Any incontinence of urine?  Endorsed has a history of OAB has a pessary, followed by urology.  Uses depends***  Any bowel dysfunction?   Denies      Patient lives with her daughter   *** Does the patient drive? No longer drives ***   History on Initial Assessment 10/13/2020: This is an 89 year old right-handed woman with a history of hypertension, hyperlipidemia, hypothyroidism, LBBB s/p ICD placement, breast cancer s/p mastectomy, chronic back pain, bipolar depression, anxiety, presenting for evaluation of memory loss. She had been following with [facility] for memory loss since Feb 25, 2015, records were reviewed. She started noticing mild short-term memory changes in 2003/02/25 after lumbar surgery. Over the years, memory changes have progressed, her daughter moved in with her in 25-Feb-2020 and she stopped driving. She had side effects on Donepezil , Rivastigmine , and Memantine .    She states her memory is terrible. [name] reports memory changes became more noticeable after her husband passed away in 25-Feb-2019. She had  left the stove on a couple of times and left keys outside the door. She would forget what she was going to say and forget conversations from the day prior. She would repeat herself at times. She manages her own medications, [name] checks behind her. Her husband was managing finances, [name] took over when he passed away. She stopped driving in Jan/Feb 2021 because she was having staggering gait. She is independent with dressing and bathing. Notes from [facility]  indicate she was tried on Donepezil  in 2017, Memantine  in 2018, Rivastigmine  in 2019. She felt like a mummy, could not think.    She denies any headaches, diplopia, dysarthria/dysphagia, neck pain, focal numbness/tingling/weakness, anosmia. She has chronic back pain and constipation. She has occasional dizziness. There are tremors in both hands affecting handwriting. Sleep is okay, she usually sleeps 8 hours at night. Her last fall was in January 2022 when she fell off the bed. She had worked with physical therapy and staggering gait has stabilized. Mood is sometimes stable, she is more irritable with herself when she could not remember things. [name] notices she is more anxious when she has to leave. No hallucinations or paranoia. No family history of dementia, history of significant head injuries, or alcohol use.      PREVIOUS MEDICATIONS: Donepezil  in 2017, memantine  2018, rivastigmine  2019 (all of them reported to feel like a mummy, I could not think)  CURRENT MEDICATIONS:  Outpatient Encounter Medications as of 02/05/2024  Medication Sig   albuterol  (VENTOLIN  HFA) 108 (90 Base) MCG/ACT inhaler Inhale 2 puffs into the lungs every 6 (six) hours as needed for wheezing or shortness of breath.   ascorbic acid (VITAMIN C) 500 MG tablet Take 500 mg by mouth daily.   buprenorphine  (BUTRANS ) 5 MCG/HR PTWK Place 1 patch onto the skin once a week.   buPROPion  (WELLBUTRIN  XL) 150 MG 24 hr tablet Take 1 tablet (150 mg total) by mouth daily.   busPIRone  (BUSPAR ) 5 MG tablet Take 1 tablet (5 mg total) by mouth 2 (two) times daily.   carvedilol  (COREG ) 3.125 MG tablet TAKE 1 TABLET BY MOUTH TWICE  DAILY   Cholecalciferol (VITAMIN D3) 50 MCG (2000 UT) TABS Take 2,000 Units by mouth daily.    Clobetasol  Prop Emollient Base (CLOBETASOL  PROPIONATE E) 0.05 % emollient cream Apply 1 Application topically at bedtime.   diclofenac  Sodium (PENNSAID ) 2 % SOLN Apply 2 Pump (40 mg total) topically 2 (two) times  daily as needed. UP TO TWO PAINFUL AREAS TWICE A DAY   diphenhydrAMINE  (BENADRYL ) 50 MG tablet 50 mg oral diphenhydramine  administered one hour prior to contrast administration. (Patient not taking: Reported on 01/09/2024)   DULoxetine (CYMBALTA) 60 MG capsule Take 60 mg by mouth daily.   estradiol  (ESTRACE ) 0.1 MG/GM vaginal cream Place 0.5g nightly for two weeks then twice a week after   famotidine (PEPCID) 10 MG tablet Take 10 mg by mouth daily as needed for heartburn or indigestion.   fluconazole  (DIFLUCAN ) 150 MG tablet Take 150 mg by mouth once.   furosemide  (LASIX ) 20 MG tablet Take 1 tablet (20 mg total) by mouth daily as needed.   hypromellose (GENTEAL) 0.3 % GEL ophthalmic ointment Apply 1 drop to eye.   levothyroxine  (SYNTHROID ) 88 MCG tablet Take 1 tablet (88 mcg total) by mouth daily.   lidocaine  (XYLOCAINE ) 2 % jelly Apply 1 Application topically as needed.   Lidocaine  4 % PTCH Apply 1 patch topically every 12 (twelve) hours. Apply to back   lubiprostone  (  AMITIZA ) 8 MCG capsule TAKE ONE CAPSULE BY MOUTH TWICE DAILY WITH A MEAL   Magnesium  250 MG TABS Take 250 mg by mouth daily.   metaxalone  (SKELAXIN ) 800 MG tablet Take 0.5 tablets (400 mg total) by mouth 3 (three) times daily as needed for muscle spasms.   Misc Natural Products (PUMPKIN SEED  OIL) CAPS Take 1 capsule by mouth daily.   NALTREXONE  HCL, PAIN, PO Take 4 mg by mouth at bedtime.   NONFORMULARY OR COMPOUNDED ITEM Apply 120 Tubes topically daily. Diclofenac /Cyclobenzaprine/lamotrigine/lidocaine /prilocaine  (10480) 2%/2%/6%/5%/1.25% cream QTY: 120 GM SIG: (NEURO) APPLY 1-2 PUMPS (1-2 GMS) TO AFFECTED AREA (S) OR FOCAL POINTS 3 TO 4 TIMES DAILY. RUB IN FOR 2 MINUTES TO ACHIEVE MAX PENETRATION. WASH HANDS WELL.   Omega-3 Fatty Acids (FISH OIL) 1200 MG CAPS Take 1,200 mg by mouth daily.   omeprazole  (PRILOSEC) 40 MG capsule TAKE 1 CAPSULE BY MOUTH DAILY   Polyethyl Glycol-Propyl Glycol (SYSTANE) 0.4-0.3 % SOLN Place 1 drop into  both eyes at bedtime.   POLYETHYLENE GLYCOL 3350  PO Take 1 Capful by mouth daily.   pregabalin  (LYRICA ) 25 MG capsule Take 1 capsule (25 mg total) by mouth 2 (two) times daily.   senna-docusate (SENOKOT S) 8.6-50 MG tablet Take 2 tablets by mouth daily.   spironolactone  (ALDACTONE ) 25 MG tablet TAKE 1 TABLET BY MOUTH IN THE  MORNING   telmisartan  (MICARDIS ) 20 MG tablet Take 1 tablet (20 mg total) by mouth daily.   Turmeric (QC TUMERIC COMPLEX PO) Take 1,000 mg by mouth daily. (Patient not taking: Reported on 01/09/2024)   valACYclovir  (VALTREX ) 1000 MG tablet Take 1 tablet (1,000 mg total) by mouth daily.   Vibegron  (GEMTESA ) 75 MG TABS Take 1 tablet by mouth daily.   Vitamin E 400 units TABS Take 400 Units by mouth daily.    Facility-Administered Encounter Medications as of 02/05/2024  Medication   denosumab  (PROLIA ) injection 60 mg       08/01/2023    5:00 PM 05/27/2023   10:53 AM 01/29/2023   12:00 PM  MMSE - Mini Mental State Exam  Orientation to time 5 5 4   Orientation to Place 5 5 5   Registration 3 3 3   Attention/ Calculation 5 3 5   Recall 3 3 3   Language- name 2 objects 2 2 2   Language- repeat 1 1 1   Language- follow 3 step command 3 3 3   Language- read & follow direction 1 1 1   Write a sentence 1 1 1   Copy design 1 1 0  Total score 30 28 28       10/13/2020   10:00 AM  Montreal Cognitive Assessment   Visuospatial/ Executive (0/5) 2  Naming (0/3) 3  Attention: Read list of digits (0/2) 2  Attention: Read list of letters (0/1) 1  Attention: Serial 7 subtraction starting at 100 (0/3) 2  Language: Repeat phrase (0/2) 1  Language : Fluency (0/1) 1  Abstraction (0/2) 1  Delayed Recall (0/5) 2  Orientation (0/6) 6  Total 21  Adjusted Score (based on education) 21    Objective:     PHYSICAL EXAMINATION:    VITALS:  There were no vitals filed for this visit.  GEN:  The patient appears stated age and is in NAD. HEENT:  Normocephalic, atraumatic.   Neurological  examination:  General: NAD, well-groomed, appears stated age. Orientation: The patient is alert. Oriented to person, place and date Cranial nerves: There is good facial symmetry.The speech is fluent and clear. No  aphasia or dysarthria. Fund of knowledge is appropriate. Recent and remote memory are impaired. Attention and concentration are reduced. Able to name objects and repeat phrases.  Hearing is intact to conversational tone. *** Sensation: Sensation is intact to light touch throughout Motor: Strength is at least antigravity x4. DTR's 2/4 in UE/LE     Movement examination: Tone: There is normal tone in the UE/LE Abnormal movements:  no tremor.  No myoclonus.  No asterixis.   Coordination:  There is no decremation with RAM's. Normal finger to nose  Gait and Station: The patient has no*** difficulty arising out of a deep-seated chair without the use of the hands. The patient's stride length is good.  Gait is cautious and narrow.    Thank you for allowing us  the opportunity to participate in the care of this nice patient. Please do not hesitate to contact us  for any questions or concerns.   Total time spent on today's visit was *** minutes dedicated to this patient today, preparing to see patient, examining the patient, ordering tests and/or medications and counseling the patient, documenting clinical information in the EHR or other health record, independently interpreting results and communicating results to the patient/family, discussing treatment and goals, answering patient's questions and coordinating care.  Cc:  [name], NP  Camie Sevin 02/04/2024 6:10 AM

## 2024-02-05 ENCOUNTER — Ambulatory Visit: Payer: Medicare Other

## 2024-02-05 ENCOUNTER — Ambulatory Visit: Admitting: Physician Assistant

## 2024-02-05 DIAGNOSIS — I428 Other cardiomyopathies: Secondary | ICD-10-CM | POA: Diagnosis not present

## 2024-02-06 LAB — CUP PACEART REMOTE DEVICE CHECK
Battery Remaining Longevity: 120 mo
Battery Remaining Percentage: 100 %
Brady Statistic RA Percent Paced: 0 %
Brady Statistic RV Percent Paced: 100 %
Date Time Interrogation Session: 20251029024000
HighPow Impedance: 39 Ohm
Implantable Lead Connection Status: 753985
Implantable Lead Connection Status: 753985
Implantable Lead Connection Status: 753985
Implantable Lead Implant Date: 20080116
Implantable Lead Implant Date: 20080116
Implantable Lead Implant Date: 20080116
Implantable Lead Location: 753858
Implantable Lead Location: 753859
Implantable Lead Location: 753860
Implantable Lead Model: 157
Implantable Lead Model: 4469
Implantable Lead Model: 4555
Implantable Lead Serial Number: 136532
Implantable Lead Serial Number: 161542
Implantable Lead Serial Number: 473495
Implantable Pulse Generator Implant Date: 20241030
Lead Channel Impedance Value: 405 Ohm
Lead Channel Impedance Value: 491 Ohm
Lead Channel Impedance Value: 933 Ohm
Lead Channel Pacing Threshold Amplitude: 0.6 V
Lead Channel Pacing Threshold Pulse Width: 0.4 ms
Lead Channel Setting Pacing Amplitude: 2 V
Lead Channel Setting Pacing Amplitude: 2 V
Lead Channel Setting Pacing Amplitude: 2.2 V
Lead Channel Setting Pacing Pulse Width: 0.4 ms
Lead Channel Setting Pacing Pulse Width: 0.8 ms
Lead Channel Setting Sensing Sensitivity: 0.5 mV
Lead Channel Setting Sensing Sensitivity: 1 mV
Pulse Gen Serial Number: 157167

## 2024-02-07 ENCOUNTER — Ambulatory Visit (INDEPENDENT_AMBULATORY_CARE_PROVIDER_SITE_OTHER): Admitting: Obstetrics and Gynecology

## 2024-02-07 ENCOUNTER — Encounter: Payer: Self-pay | Admitting: Obstetrics and Gynecology

## 2024-02-07 VITALS — BP 127/76 | HR 67

## 2024-02-07 DIAGNOSIS — N390 Urinary tract infection, site not specified: Secondary | ICD-10-CM

## 2024-02-07 DIAGNOSIS — N952 Postmenopausal atrophic vaginitis: Secondary | ICD-10-CM

## 2024-02-07 DIAGNOSIS — N3281 Overactive bladder: Secondary | ICD-10-CM | POA: Diagnosis not present

## 2024-02-07 MED ORDER — NITROFURANTOIN MONOHYD MACRO 100 MG PO CAPS: 100.0000 mg | ORAL_CAPSULE | Freq: Every day | ORAL | 5 refills | Status: AC

## 2024-02-07 MED ORDER — ESTRADIOL 7.5 MCG/24HR VA RING: 1.0000 | VAGINAL_RING | VAGINAL | 3 refills | Status: AC

## 2024-02-07 MED ORDER — TROSPIUM CHLORIDE ER 60 MG PO CP24: 1.0000 | ORAL_CAPSULE | Freq: Every day | ORAL | 11 refills | Status: DC

## 2024-02-07 NOTE — Progress Notes (Addendum)
 [facility] Return Visit  SUBJECTIVE  History of Present Illness: Remi Rester is a 89 y.o. female seen in follow-up for mixed incontinence. Had intravesical botox  on 09/26/23. Also had second urethral bulking on 05/16/23. Last visit was started on daily valtex for suppression of genital HSV flares.   Needed to self catheterize due to urinary retention following botox . She has stopped catheterizing at last visit.  In the last few weeks has noticed more incontinence and urgency on the way to the bathroom. Does not have UTI symptoms today.   She is using vaginal estrogen 2 times a week but she notices the cream comes out when she voids in the middle of the night.   Urine cultures:  01/08/24- >100,000 E.Coli 12/04/23- >100,000 Aerococcus urinae  She has tried trospium , myrbetriq , gemtesa  and vesicare for OAB. Feels the trospium  worked best for her. Has a pacemaker and not eligible for PTNS.   Past Medical History: Patient  has a past medical history of Arthritis, Cancer (HCC), Cataracts, bilateral, CHF (congestive heart failure) (HCC), Complication of anesthesia, Depression, Dyslipidemia, Fainted (04/21/06), GERD (gastroesophageal reflux disease), Headache(784.0), Hearing loss, HLD (hyperlipidemia), Hypertension, Hypothyroidism, ICD (implantable cardiac defibrillator) in place, ICD (implantable cardiac defibrillator), biventricular, in situ, LBBB (left bundle branch block), Memory loss, Nonischemic cardiomyopathy (HCC), Normal coronary arteries, Pacemaker, Syncope, Systolic CHF (HCC), Vertigo, and Wears glasses.   Past Surgical History: She  has a past surgical history that includes Total knee arthroplasty (05/17/01); Lumbar fusion (2006); Mastectomy, partial (2008); Breast surgery (2000); Joint replacement (06/14/01); Pacemaker insertion (04/23/06); Back surgery; Cardiac catheterization; Mass excision (11/08/2011); implantable cardioverter defibrillator generator change (N/A, 12/18/2012);  Cataract extraction; Eye surgery; Total knee arthroplasty (Left, 11/29/2014); Breast lumpectomy (Left, 2008); Colonoscopy; Mastectomy w/ sentinel node biopsy (Left, 06/04/2019); BIV ICD GENERATOR CHANGEOUT (N/A, 02/06/2023); and IR INJECT DIAG/THERA/INC NEEDLE/CATH/PLC EPI/LUMB/SAC W/IMG (06/28/2023).   Medications: She has a current medication list which includes the following prescription(s): albuterol , ascorbic acid, buprenorphine , bupropion , buspirone , carvedilol , vitamin d3, clobetasol  propionate e, diclofenac  sodium, duloxetine, estradiol , estradiol , famotidine, fluconazole , furosemide , hypromellose, levothyroxine , lidocaine , lidocaine , lubiprostone , magnesium , metaxalone , pumpkin seed  oil, nitrofurantoin (macrocrystal-monohydrate), NONFORMULARY OR COMPOUNDED ITEM, fish oil, omeprazole , systane, polyethylene glycol 3350 , pregabalin , senna-docusate, spironolactone , telmisartan , trospium  chloride, valacyclovir , vitamin e, and diphenhydramine , and the following Facility-Administered Medications: denosumab .   Allergies: Patient is allergic to iodinated contrast media, iodine , shellfish allergy, strawberry extract, memantine , aspirin , and codeine.   Social History: Patient  reports that she has never smoked. She has never been exposed to tobacco smoke. She has never used smokeless tobacco. She reports that she does not drink alcohol and does not use drugs.     OBJECTIVE     Physical Exam: Vitals:   02/07/24 1146  BP: 127/76  Pulse: 67    Gen: No apparent distress, A&O x 3.  Pessary removed and cleaned. Speculum exam revealed abrasions at the vaginal apex, no blood present. Premarin cream placed at the apex. Pessary replaced.    ASSESSMENT AND PLAN    Ms. Esqueda is a 89 y.o. with:  1. Vaginal atrophy   2. Overactive bladder   3. Recurrent UTI     - Likely the recent UTIs are due to the urinary retention issue, which has now improved since the botox  has worn off. But since this is  impacting her QoL, will start suppressive antibiotics with daily nitrofurantoin x 6 months.  - continue estrace  cream 2-3 times per week. Ordered estring  as an alternative- can evaluate cost and bring to  office if she wants this instead of the cream.  - Still has HSV lesions on clitoral hood. She is taking valtex suppression. Can use lidocaine  cream as needed in that area for comfort.  - Will restart trospium  60mg  ER daily. We discussed that if results are not optimal then can consider combo therapy (gemtesa / myrbetriq )  Return 4-6 weeks    [name], MD

## 2024-02-07 NOTE — Patient Instructions (Signed)
 Start trospium  60mg  daily for overactive bladder Start nitrofurantoin daily for UTI prevention Estring  was ordered for vaginal atrophy. Continue to use estrogen cream for now twice a week and see what the cost of the ring is.

## 2024-02-11 NOTE — Progress Notes (Incomplete)
 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 RH female with a history ofhypertension, hyperlipidemia, hypothyroidism, LBBB status post ICD placement, breast cancer status postmastectomy, chronic back pain, bipolar disorder, depression, anxiety, and mild dementia likely due to Alzheimer's disease seen today in follow up for memory loss. Patient is currently no longer on the dementia medication due to undesirable side effects and is not interested in trying galantamine.  She is independent with her ADLs and attends [facility] 3 times a week.  In today's visit memory is***with MMSE/30 no longer drives.  Mood is good.       Follow up in   months. Continue attending [facility] at [facility] Recommend good control of her cardiovascular risk factors Continue to control mood as per PCP     Subjective:    This patient is accompanied in the office by her daughter*** who supplements the history.  Previous records as well as any outside records available were reviewed prior to todays visit. Patient was last seen on 08/01/2023 with MMSE 30/30***   Any changes in memory since last visit?  repeats oneself?  Endorsed Disoriented when walking into a room? Denies ***  Leaving objects?  May misplace things but not in unusual places***  Wandering behavior?  denies   Any personality changes since last visit?  Denies.   Any worsening depression?:  She has some down moments .  She sees psychiatry Hallucinations or paranoia?  Denies.   Seizures? denies    Any sleep changes?  Does not sleep very well because she has to get up to urinate.***Denies vivid dreams, REM behavior or sleepwalking   Sleep apnea?   Denies.   Any hygiene concerns? Denies.  Independent of bathing and dressing?  Endorsed  Does the patient needs help with medications?   Patient is in charge *** Who is in charge of the finances?  Daughter is in charge   *** Any  changes in appetite?  denies ***   Patient have trouble swallowing? Denies.   Does the patient cook? No Any headaches?   denies   Any vision changes?*** Chronic back pain endorsed, did PT, seen by [facility] Ambulates with difficulty? Denies.  Enjoys chair Zumba, uses a cane for stability (she calls it Iyanbito)*** Recent falls or head injuries? Denies.     Unilateral weakness, numbness or tingling? denies   Any tremors?  Denies  *** Any anosmia?  Denies   Any incontinence of urine?  Endorsed has a history of OAB has a pessary, followed by urology.  Uses depends***  Any bowel dysfunction?   Denies      Patient lives with her daughter   *** Does the patient drive? No longer drives ***   History on Initial Assessment 10/13/2020: This is an 89 year old right-handed woman with a history of hypertension, hyperlipidemia, hypothyroidism, LBBB s/p ICD placement, breast cancer s/p mastectomy, chronic back pain, bipolar depression, anxiety, presenting for evaluation of memory loss. She had been following with [facility] for memory loss since 03-11-2015, records were reviewed. She started noticing mild short-term memory changes in 03/11/2003 after lumbar surgery. Over the years, memory changes have progressed, her daughter moved in with her in 10-Mar-2020 and she stopped driving. She had side effects on Donepezil , Rivastigmine , and Memantine .    She states her memory is terrible. [name] reports memory changes became more noticeable after her husband passed away in Mar 11, 2019. She had  left the stove on a couple of times and left keys outside the door. She would forget what she was going to say and forget conversations from the day prior. She would repeat herself at times. She manages her own medications, [name] checks behind her. Her husband was managing finances, [name] took over when he passed away. She stopped driving in Jan/Feb 2021 because she was having staggering gait. She is independent with dressing and bathing. Notes from [facility]  indicate she was tried on Donepezil  in 2017, Memantine  in 2018, Rivastigmine  in 2019. She felt like a mummy, could not think.    She denies any headaches, diplopia, dysarthria/dysphagia, neck pain, focal numbness/tingling/weakness, anosmia. She has chronic back pain and constipation. She has occasional dizziness. There are tremors in both hands affecting handwriting. Sleep is okay, she usually sleeps 8 hours at night. Her last fall was in January 2022 when she fell off the bed. She had worked with physical therapy and staggering gait has stabilized. Mood is sometimes stable, she is more irritable with herself when she could not remember things. [name] notices she is more anxious when she has to leave. No hallucinations or paranoia. No family history of dementia, history of significant head injuries, or alcohol use.      PREVIOUS MEDICATIONS: Donepezil  in 2017, memantine  2018, rivastigmine  2019 (all of them reported to feel like a mummy, I could not think)  CURRENT MEDICATIONS:  Outpatient Encounter Medications as of 02/17/2024  Medication Sig   albuterol  (VENTOLIN  HFA) 108 (90 Base) MCG/ACT inhaler Inhale 2 puffs into the lungs every 6 (six) hours as needed for wheezing or shortness of breath.   ascorbic acid (VITAMIN C) 500 MG tablet Take 500 mg by mouth daily.   buprenorphine  (BUTRANS ) 5 MCG/HR PTWK Place 1 patch onto the skin once a week.   buPROPion  (WELLBUTRIN  XL) 150 MG 24 hr tablet Take 1 tablet (150 mg total) by mouth daily.   busPIRone  (BUSPAR ) 5 MG tablet Take 1 tablet (5 mg total) by mouth 2 (two) times daily.   carvedilol  (COREG ) 3.125 MG tablet TAKE 1 TABLET BY MOUTH TWICE  DAILY   Cholecalciferol (VITAMIN D3) 50 MCG (2000 UT) TABS Take 2,000 Units by mouth daily.    Clobetasol  Prop Emollient Base (CLOBETASOL  PROPIONATE E) 0.05 % emollient cream Apply 1 Application topically at bedtime.   diclofenac  Sodium (PENNSAID ) 2 % SOLN Apply 2 Pump (40 mg total) topically 2 (two) times  daily as needed. UP TO TWO PAINFUL AREAS TWICE A DAY   diphenhydrAMINE  (BENADRYL ) 50 MG tablet 50 mg oral diphenhydramine  administered one hour prior to contrast administration. (Patient not taking: Reported on 02/07/2024)   DULoxetine (CYMBALTA) 60 MG capsule Take 60 mg by mouth daily.   estradiol  (ESTRACE ) 0.1 MG/GM vaginal cream Place 0.5g nightly for two weeks then twice a week after   estradiol  (ESTRING ) 7.5 MCG/24HR vaginal ring Place 1 each vaginally every 3 (three) months.   famotidine (PEPCID) 10 MG tablet Take 10 mg by mouth daily as needed for heartburn or indigestion.   fluconazole  (DIFLUCAN ) 150 MG tablet Take 150 mg by mouth once.   furosemide  (LASIX ) 20 MG tablet Take 1 tablet (20 mg total) by mouth daily as needed.   hypromellose (GENTEAL) 0.3 % GEL ophthalmic ointment Apply 1 drop to eye.   levothyroxine  (SYNTHROID ) 88 MCG tablet Take 1 tablet (88 mcg total) by mouth daily.   lidocaine  (XYLOCAINE ) 2 % jelly Apply 1 Application topically as needed.   Lidocaine  4 %  PTCH Apply 1 patch topically every 12 (twelve) hours. Apply to back   lubiprostone  (AMITIZA ) 8 MCG capsule TAKE ONE CAPSULE BY MOUTH TWICE DAILY WITH A MEAL   Magnesium  250 MG TABS Take 250 mg by mouth daily.   metaxalone  (SKELAXIN ) 800 MG tablet Take 0.5 tablets (400 mg total) by mouth 3 (three) times daily as needed for muscle spasms.   Misc Natural Products (PUMPKIN SEED  OIL) CAPS Take 1 capsule by mouth daily.   nitrofurantoin, macrocrystal-monohydrate, (MACROBID) 100 MG capsule Take 1 capsule (100 mg total) by mouth daily.   NONFORMULARY OR COMPOUNDED ITEM Apply 120 Tubes topically daily. Diclofenac /Cyclobenzaprine/lamotrigine/lidocaine /prilocaine  (10480) 2%/2%/6%/5%/1.25% cream QTY: 120 GM SIG: (NEURO) APPLY 1-2 PUMPS (1-2 GMS) TO AFFECTED AREA (S) OR FOCAL POINTS 3 TO 4 TIMES DAILY. RUB IN FOR 2 MINUTES TO ACHIEVE MAX PENETRATION. WASH HANDS WELL.   Omega-3 Fatty Acids (FISH OIL) 1200 MG CAPS Take 1,200 mg by  mouth daily.   omeprazole  (PRILOSEC) 40 MG capsule TAKE 1 CAPSULE BY MOUTH DAILY   Polyethyl Glycol-Propyl Glycol (SYSTANE) 0.4-0.3 % SOLN Place 1 drop into both eyes at bedtime.   POLYETHYLENE GLYCOL 3350  PO Take 1 Capful by mouth daily.   pregabalin  (LYRICA ) 25 MG capsule Take 1 capsule (25 mg total) by mouth 2 (two) times daily.   senna-docusate (SENOKOT S) 8.6-50 MG tablet Take 2 tablets by mouth daily.   spironolactone  (ALDACTONE ) 25 MG tablet TAKE 1 TABLET BY MOUTH IN THE  MORNING   telmisartan  (MICARDIS ) 20 MG tablet Take 1 tablet (20 mg total) by mouth daily.   Trospium  Chloride 60 MG CP24 Take 1 capsule (60 mg total) by mouth daily.   valACYclovir  (VALTREX ) 1000 MG tablet Take 1 tablet (1,000 mg total) by mouth daily.   Vitamin E 400 units TABS Take 400 Units by mouth daily.    Facility-Administered Encounter Medications as of 02/17/2024  Medication   denosumab  (PROLIA ) injection 60 mg       08/01/2023    5:00 PM 05/27/2023   10:53 AM 01/29/2023   12:00 PM  MMSE - Mini Mental State Exam  Orientation to time 5 5 4   Orientation to Place 5 5 5   Registration 3 3 3   Attention/ Calculation 5 3 5   Recall 3 3 3   Language- name 2 objects 2 2 2   Language- repeat 1 1 1   Language- follow 3 step command 3 3 3   Language- read & follow direction 1 1 1   Write a sentence 1 1 1   Copy design 1 1 0  Total score 30 28 28       10/13/2020   10:00 AM  Montreal Cognitive Assessment   Visuospatial/ Executive (0/5) 2  Naming (0/3) 3  Attention: Read list of digits (0/2) 2  Attention: Read list of letters (0/1) 1  Attention: Serial 7 subtraction starting at 100 (0/3) 2  Language: Repeat phrase (0/2) 1  Language : Fluency (0/1) 1  Abstraction (0/2) 1  Delayed Recall (0/5) 2  Orientation (0/6) 6  Total 21  Adjusted Score (based on education) 21    Objective:     PHYSICAL EXAMINATION:    VITALS:  There were no vitals filed for this visit.  GEN:  The patient appears stated age and  is in NAD. HEENT:  Normocephalic, atraumatic.   Neurological examination:  General: NAD, well-groomed, appears stated age. Orientation: The patient is alert. Oriented to person, place and date Cranial nerves: There is good facial symmetry.The speech is fluent  and clear. No aphasia or dysarthria. Fund of knowledge is appropriate. Recent and remote memory are impaired. Attention and concentration are reduced. Able to name objects and repeat phrases.  Hearing is intact to conversational tone. *** Sensation: Sensation is intact to light touch throughout Motor: Strength is at least antigravity x4. DTR's 2/4 in UE/LE     Movement examination: Tone: There is normal tone in the UE/LE Abnormal movements:  no tremor.  No myoclonus.  No asterixis.   Coordination:  There is no decremation with RAM's. Normal finger to nose  Gait and Station: The patient has no*** difficulty arising out of a deep-seated chair without the use of the hands. The patient's stride length is good.  Gait is cautious and narrow.    Thank you for allowing us  the opportunity to participate in the care of this nice patient. Please do not hesitate to contact us  for any questions or concerns.   Total time spent on today's visit was *** minutes dedicated to this patient today, preparing to see patient, examining the patient, ordering tests and/or medications and counseling the patient, documenting clinical information in the EHR or other health record, independently interpreting results and communicating results to the patient/family, discussing treatment and goals, answering patient's questions and coordinating care.  Cc:  [name], NP  Camie Sevin 02/11/2024 6:29 AM

## 2024-02-12 ENCOUNTER — Other Ambulatory Visit: Payer: Self-pay | Admitting: Nurse Practitioner

## 2024-02-12 DIAGNOSIS — D869 Sarcoidosis, unspecified: Secondary | ICD-10-CM

## 2024-02-12 NOTE — Progress Notes (Signed)
 Remote ICD Transmission

## 2024-02-13 ENCOUNTER — Ambulatory Visit: Payer: Self-pay | Admitting: Internal Medicine

## 2024-02-17 ENCOUNTER — Ambulatory Visit: Admitting: Physician Assistant

## 2024-02-21 ENCOUNTER — Emergency Department (HOSPITAL_COMMUNITY)
Admission: EM | Admit: 2024-02-21 | Discharge: 2024-02-22 | Disposition: A | Discharge status: Home / Self Care | Source: Other Acute Inpatient Hospital | Attending: Emergency Medicine | Admitting: Emergency Medicine

## 2024-02-21 ENCOUNTER — Other Ambulatory Visit: Payer: Self-pay

## 2024-02-21 DIAGNOSIS — E119 Type 2 diabetes mellitus without complications: Secondary | ICD-10-CM | POA: Insufficient documentation

## 2024-02-21 DIAGNOSIS — E039 Hypothyroidism, unspecified: Secondary | ICD-10-CM | POA: Diagnosis not present

## 2024-02-21 DIAGNOSIS — I110 Hypertensive heart disease with heart failure: Secondary | ICD-10-CM | POA: Insufficient documentation

## 2024-02-21 DIAGNOSIS — I5022 Chronic systolic (congestive) heart failure: Secondary | ICD-10-CM | POA: Diagnosis not present

## 2024-02-21 DIAGNOSIS — Z79899 Other long term (current) drug therapy: Secondary | ICD-10-CM | POA: Diagnosis not present

## 2024-02-21 DIAGNOSIS — Z853 Personal history of malignant neoplasm of breast: Secondary | ICD-10-CM | POA: Diagnosis not present

## 2024-02-21 DIAGNOSIS — R519 Headache, unspecified: Secondary | ICD-10-CM | POA: Diagnosis present

## 2024-02-21 DIAGNOSIS — Z96653 Presence of artificial knee joint, bilateral: Secondary | ICD-10-CM | POA: Insufficient documentation

## 2024-02-21 DIAGNOSIS — I158 Other secondary hypertension: Secondary | ICD-10-CM

## 2024-02-21 DIAGNOSIS — Z950 Presence of cardiac pacemaker: Secondary | ICD-10-CM | POA: Diagnosis not present

## 2024-02-21 LAB — CBC WITH DIFFERENTIAL/PLATELET
Abs Immature Granulocytes: 0.02 K/uL (ref 0.00–0.07)
Basophils Absolute: 0.1 K/uL (ref 0.0–0.1)
Basophils Relative: 1 %
Eosinophils Absolute: 0.3 K/uL (ref 0.0–0.5)
Eosinophils Relative: 4 %
HCT: 39.9 % (ref 36.0–46.0)
Hemoglobin: 13.1 g/dL (ref 12.0–15.0)
Immature Granulocytes: 0 %
Lymphocytes Relative: 29 %
Lymphs Abs: 2.3 K/uL (ref 0.7–4.0)
MCH: 29 pg (ref 26.0–34.0)
MCHC: 32.8 g/dL (ref 30.0–36.0)
MCV: 88.3 fL (ref 80.0–100.0)
Monocytes Absolute: 0.9 K/uL (ref 0.1–1.0)
Monocytes Relative: 11 %
Neutro Abs: 4.5 K/uL (ref 1.7–7.7)
Neutrophils Relative %: 55 %
Platelets: 152 K/uL (ref 150–400)
RBC: 4.52 MIL/uL (ref 3.87–5.11)
RDW: 15.6 % — ABNORMAL HIGH (ref 11.5–15.5)
WBC: 8.1 K/uL (ref 4.0–10.5)
nRBC: 0 % (ref 0.0–0.2)

## 2024-02-21 LAB — COMPREHENSIVE METABOLIC PANEL WITH GFR
ALT: 16 U/L (ref 0–44)
AST: 25 U/L (ref 15–41)
Albumin: 4.3 g/dL (ref 3.5–5.0)
Alkaline Phosphatase: 53 U/L (ref 38–126)
Anion gap: 9 (ref 5–15)
BUN: 14 mg/dL (ref 8–23)
CO2: 29 mmol/L (ref 22–32)
Calcium: 11.5 mg/dL — ABNORMAL HIGH (ref 8.9–10.3)
Chloride: 98 mmol/L (ref 98–111)
Creatinine, Ser: 0.66 mg/dL (ref 0.44–1.00)
GFR, Estimated: >60 mL/min (ref >60)
Glucose, Bld: 91 mg/dL (ref 70–99)
Potassium: 4.7 mmol/L (ref 3.5–5.1)
Sodium: 135 mmol/L (ref 135–145)
Total Bilirubin: 0.3 mg/dL (ref 0.0–1.2)
Total Protein: 7.6 g/dL (ref 6.5–8.1)

## 2024-02-21 NOTE — ED Notes (Signed)
Urine sent to main lab.

## 2024-02-21 NOTE — ED Triage Notes (Signed)
 Patient c/o hypetension 200/100 at home. Family report patient was seen at [facility] tonight and recommended to come for further evaluation. Patient denies CP and SOB. Patient denies N/V.

## 2024-02-21 NOTE — ED Provider Notes (Signed)
 [place] [facility] AT [facility] Provider Note  CSN: [id number] Arrival date & time: 02/21/24 2019  Chief Complaint(s) Hypertension  HPI [name] is a 89 y.o. female with a past medical history listed below including diabetes and peripheral neuropathy who presents to the emergency department for elevated blood pressures noted today.  Patient reports that she checks it regularly every day.  Today when she awoke, she noted that her balance was worse than usual.  She denies any focal deficits.  No loss of sensation.  No visual changes.  She does endorse mild headache but that presented this afternoon.  She reported 2 episodes of very brief chest twinges, 1 last night and 1 while waiting to be seen here in the emergency department.  No other chest pain or shortness of breath.  Patient denies high salt diet.  States that she did not because any of her medications yesterday or this morning.  The history is provided by the patient.    Past Medical History Past Medical History:  Diagnosis Date   Arthritis    Cancer (HCC)    left breast cancer    Cataracts, bilateral    removed by surgery   CHF (congestive heart failure) (HCC)    PACEMAKER & DEFIB   Complication of anesthesia    hypotensive after back surgery in 2006   Depression    Dyslipidemia    Fainted 04/21/06   AT [facility]   GERD (gastroesophageal reflux disease)    Headache(784.0)    Hearing loss    bilateral hearing aids   HLD (hyperlipidemia)    diet controlled    Hypertension    Hypothyroidism    ICD (implantable cardiac defibrillator) in place    pt has pacer/icd   ICD (implantable cardiac defibrillator), biventricular, in situ    LBBB (left bundle branch block)    Memory loss    Nonischemic cardiomyopathy (HCC)    Normal coronary arteries    s/p cardiac cath 2007   Pacemaker    ICD [facility]   Syncope    Systolic CHF [facility])    Vertigo    Wears glasses    Patient Active  Problem List   Diagnosis Date Noted   Positive ANA (antinuclear antibody) 01/09/2024   Vitamin D  deficiency 01/09/2024   Spinal stenosis of lumbar region with neurogenic claudication 08/28/2023   Nerve pain due to spinal stenosis 08/28/2023   Piriformis syndrome of both sides 01/30/2023   Neuropathy 08/01/2022   Atherosclerosis of native artery of both lower extremities with rest pain (HCC) 07/09/2022   Interstitial pulmonary disease (HCC) 07/09/2022   Sciatica associated with disorder of multiple sites of spine 03/20/2021   Bipolar II disorder (HCC) 03/20/2021   Chronic pain syndrome 03/20/2021   Urinary retention 02/04/2020   Seasonal allergies 02/04/2020   Recurrent major depressive disorder, in partial remission 07/17/2019   Status post left mastectomy 07/01/2019   Breast cancer of lower-outer quadrant of left female breast (HCC) 06/04/2019   Candida infection, oral 01/15/2019   Senile purpura 04/14/2018   Lumbar post-laminectomy syndrome 12/03/2017   Lumbar spondylosis 12/03/2017   History of back surgery 09/01/2017   Constipation 09/01/2017   Hypothyroidism due to acquired atrophy of thyroid 09/01/2017   Mixed hyperlipidemia 09/01/2017   Age-related osteoporosis without current pathological fracture 09/01/2017   High risk medication use 09/01/2017   Arthritis of hand 05/17/2017   Atherosclerosis of native arteries of extremity with intermittent claudication 05/15/2017  Status post total bilateral knee replacement 12/20/2016   Gait abnormality 07/16/2016   Chronic low back pain 07/16/2016   Mild cognitive impairment 07/16/2016   Wrist pain 05/10/2015   S/P left TKA 11/29/2014   S/P knee replacement 11/29/2014   Spontaneous bruising 08/27/2014   Numbness and tingling in right hand 07/28/2014   Essential tremor 07/28/2014   Dizziness 05/28/2014   Shaky 05/28/2014   Memory loss 05/28/2014   Depression 05/06/2014   Lipoma of left upper thigh 3x5 cm 09/28/2011   Cerebral  vascular accident (HCC) 08/09/2011   Syncope 08/09/2011   History of breast cancer [id number], s/p BCT 2008, triple negative 01/26/2011   Chronic L breast pain with chronic recurrent seroma, s/p excisional biopsy 01/12/2010 01/26/2011   Fainted    ICD (implantable cardioverter-defibrillator), biventricular, in situ 08/02/2010   Chronic systolic heart failure (HCC) 08/02/2010   Essential hypertension 08/02/2010   Home Medication(s) Prior to Admission medications   Medication Sig Start Date End Date Taking? Authorizing Provider  albuterol  (VENTOLIN  HFA) 108 (90 Base) MCG/ACT inhaler Inhale 2 puffs into the lungs every 6 (six) hours as needed for wheezing or shortness of breath. 02/12/24   [name], NP  ascorbic acid (VITAMIN C) 500 MG tablet Take 500 mg by mouth daily.    [provider]  buprenorphine  (BUTRANS ) 5 MCG/HR PTWK Place 1 patch onto the skin once a week. 12/16/23   Lovorn, Megan, MD  buPROPion  (WELLBUTRIN  XL) 150 MG 24 hr tablet Take 1 tablet (150 mg total) by mouth daily. 08/12/20   [name], NP  busPIRone  (BUSPAR ) 5 MG tablet Take 1 tablet (5 mg total) by mouth 2 (two) times daily. 10/02/23   Medina-Vargas, Monina C, NP  carvedilol  (COREG ) 3.125 MG tablet TAKE 1 TABLET BY MOUTH TWICE  DAILY 06/04/23   Taylor, Gregg W, MD  Cholecalciferol (VITAMIN D3) 50 MCG (2000 UT) TABS Take 2,000 Units by mouth daily.     [provider]  Clobetasol  Prop Emollient Base (CLOBETASOL  PROPIONATE E) 0.05 % emollient cream Apply 1 Application topically at bedtime. 05/17/23   Marilynne Rosaline SAILOR, MD  diclofenac  Sodium (PENNSAID ) 2 % SOLN Apply 2 Pump (40 mg total) topically 2 (two) times daily as needed. UP TO TWO PAINFUL AREAS TWICE A DAY 10/21/23   [name], NP  diphenhydrAMINE  (BENADRYL ) 50 MG tablet 50 mg oral diphenhydramine  administered one hour prior to contrast administration. Patient not taking: Reported on 02/07/2024 06/25/23   Sherwood Drivers, PA-C   DULoxetine (CYMBALTA) 60 MG capsule Take 60 mg by mouth daily.    [provider]  estradiol  (ESTRACE ) 0.1 MG/GM vaginal cream Place 0.5g nightly for two weeks then twice a week after 08/08/23   Marilynne Rosaline SAILOR, MD  estradiol  (ESTRING ) 7.5 MCG/24HR vaginal ring Place 1 each vaginally every 3 (three) months. 02/07/24   Marilynne Rosaline SAILOR, MD  famotidine (PEPCID) 10 MG tablet Take 10 mg by mouth daily as needed for heartburn or indigestion.    [provider]  fluconazole  (DIFLUCAN ) 150 MG tablet Take 150 mg by mouth once. 01/08/24   [provider]  furosemide  (LASIX ) 20 MG tablet Take 1 tablet (20 mg total) by mouth daily as needed. 06/06/22   Ngetich, Dinah C, NP  hypromellose (GENTEAL) 0.3 % GEL ophthalmic ointment Apply 1 drop to eye.    [provider]  levothyroxine  (SYNTHROID ) 88 MCG tablet Take 1 tablet (88 mcg total) by mouth daily. 06/26/23   Caro,  Jessica K, NP  lidocaine  (XYLOCAINE ) 2 % jelly Apply 1 Application topically as needed. 01/20/24   Zuleta, Kaitlin G, NP  Lidocaine  4 % PTCH Apply 1 patch topically every 12 (twelve) hours. Apply to back    [provider]  lubiprostone  (AMITIZA ) 8 MCG capsule TAKE ONE CAPSULE BY MOUTH TWICE DAILY WITH A MEAL 04/15/23   Eubanks, Jessica K, NP  Magnesium  250 MG TABS Take 250 mg by mouth daily.    [provider]  metaxalone  (SKELAXIN ) 800 MG tablet Take 0.5 tablets (400 mg total) by mouth 3 (three) times daily as needed for muscle spasms. 08/28/23   Lovorn, Megan, MD  Misc Natural Products (PUMPKIN SEED  OIL) CAPS Take 1 capsule by mouth daily. 06/27/22   Zuleta, Kaitlin G, NP  nitrofurantoin, macrocrystal-monohydrate, (MACROBID) 100 MG capsule Take 1 capsule (100 mg total) by mouth daily. 02/07/24   Marilynne Rosaline SAILOR, MD  NONFORMULARY OR COMPOUNDED ITEM Apply 120 Tubes topically daily. Diclofenac /Cyclobenzaprine/lamotrigine/lidocaine /prilocaine  (10480) 2%/2%/6%/5%/1.25% cream QTY: 120  GM SIG: (NEURO) APPLY 1-2 PUMPS (1-2 GMS) TO AFFECTED AREA (S) OR FOCAL POINTS 3 TO 4 TIMES DAILY. RUB IN FOR 2 MINUTES TO ACHIEVE MAX PENETRATION. WASH HANDS WELL. 09/17/23   [name], NP  Omega-3 Fatty Acids (FISH OIL) 1200 MG CAPS Take 1,200 mg by mouth daily.    [provider]  omeprazole  (PRILOSEC) 40 MG capsule TAKE 1 CAPSULE BY MOUTH DAILY 11/18/23   Eubanks, Jessica K, NP  Polyethyl Glycol-Propyl Glycol (SYSTANE) 0.4-0.3 % SOLN Place 1 drop into both eyes at bedtime.    [provider]  POLYETHYLENE GLYCOL 3350  PO Take 1 Capful by mouth daily.    [provider]  pregabalin  (LYRICA ) 25 MG capsule Take 1 capsule (25 mg total) by mouth 2 (two) times daily. 08/28/23   Lovorn, Megan, MD  senna-docusate (SENOKOT S) 8.6-50 MG tablet Take 2 tablets by mouth daily. 01/28/20   Reed, Tiffany L, DO  spironolactone  (ALDACTONE ) 25 MG tablet TAKE 1 TABLET BY MOUTH IN THE  MORNING 11/18/23   [name], NP  telmisartan  (MICARDIS ) 20 MG tablet Take 1 tablet (20 mg total) by mouth daily. 06/26/23   [name], NP  Trospium  Chloride 60 MG CP24 Take 1 capsule (60 mg total) by mouth daily. 02/07/24   Marilynne Rosaline SAILOR, MD  valACYclovir  (VALTREX ) 1000 MG tablet Take 1 tablet (1,000 mg total) by mouth daily. 01/08/24 07/06/24  Zuleta, Kaitlin G, NP  Vitamin E 400 units TABS Take 400 Units by mouth daily.     [provider]                                                                                                                                    Allergies Iodinated contrast media, Iodine , Shellfish allergy, Strawberry extract, Memantine , Aspirin , and Codeine  Review of Systems Review of Systems As noted in HPI  Physical  Exam Vital Signs  I have reviewed the triage vital signs BP (!) 179/87   Pulse 65   Temp 97.9 F (36.6 C) (Oral)   Resp 18   LMP  (LMP Unknown)   SpO2 100%   Physical Exam Vitals reviewed.  Constitutional:       General: She is not in acute distress.    Appearance: She is well-developed. She is not diaphoretic.  HENT:     Head: Normocephalic and atraumatic.     Nose: Nose normal.  Eyes:     General: No scleral icterus.       Right eye: No discharge.        Left eye: No discharge.     Conjunctiva/sclera: Conjunctivae normal.     Pupils: Pupils are equal, round, and reactive to light.  Cardiovascular:     Rate and Rhythm: Normal rate and regular rhythm.     Heart sounds: No murmur heard.    No friction rub. No gallop.  Pulmonary:     Effort: Pulmonary effort is normal. No respiratory distress.     Breath sounds: Normal breath sounds. No stridor. No rales.  Abdominal:     General: There is no distension.     Palpations: Abdomen is soft.     Tenderness: There is no abdominal tenderness.  Musculoskeletal:        General: No tenderness.     Cervical back: Normal range of motion and neck supple.  Skin:    General: Skin is warm and dry.     Findings: No erythema or rash.  Neurological:     Mental Status: She is alert and oriented to person, place, and time.     Comments: Mental Status:  Alert and oriented to person, place, and time.  Attention and concentration normal.  Speech clear.  Recent memory is intact  Cranial Nerves:  II Visual Fields: Intact to confrontation. Visual fields intact. III, IV, VI: Pupils equal and reactive to light and near. Full eye movement without nystagmus  V Facial Sensation: Normal. No weakness of masticatory muscles  VII: No facial weakness or asymmetry  VIII Auditory Acuity: Grossly normal  IX/X: The uvula is midline; the palate elevates symmetrically  XI: Normal sternocleidomastoid and trapezius strength  XII: The tongue is midline. No atrophy or fasciculations.   Motor System: Muscle Strength: 5/5 and symmetric in the upper and lower extremities. No pronation or drift.  Muscle Tone: Tone and muscle bulk are normal in the upper and lower extremities.   Reflexes: No Clonus Coordination: Intact finger-to-nose, heel-to-shin. No tremor.  Sensation: Intact to light touch.  Negative Romberg test.  Gait: Routine gait slow but steady. Slight staggering when turning.      ED Results and Treatments Labs (all labs ordered are listed, but only abnormal results are displayed) Labs Reviewed  CBC WITH DIFFERENTIAL/PLATELET - Abnormal; Notable for the following components:      Result Value   RDW 15.6 (*)    All other components within normal limits  COMPREHENSIVE METABOLIC PANEL WITH GFR - Abnormal; Notable for the following components:   Calcium 11.5 (*)    All other components within normal limits  EKG  EKG Interpretation Date/Time:  Friday February 21 2024 20:32:09 EST Ventricular Rate:  70 PR Interval:  135 QRS Duration:  130 QT Interval:  407 QTC Calculation: 440 R Axis:   -51  Text Interpretation: Sinus rhythm Atrial premature complex Nonspecific IVCD with LAD Left ventricular hypertrophy No significant change since last tracing Confirmed by Trine Likes [phone]) on 02/22/2024 12:08:41 AM       Radiology CT Head Wo Contrast Result Date: 02/22/2024 EXAM: CT HEAD WITHOUT CONTRAST 02/22/2024 12:12:26 AM TECHNIQUE: CT of the head was performed without the administration of intravenous contrast. Automated exposure control, iterative reconstruction, and/or weight based adjustment of the mA/kV was utilized to reduce the radiation dose to as low as reasonably achievable. COMPARISON: None available. CLINICAL HISTORY: Ataxia, acute, nontraumatic (Ped 0-17y) FINDINGS: BRAIN AND VENTRICLES: No acute hemorrhage. No evidence of acute infarct. No hydrocephalus. No extra-axial collection. No mass effect or midline shift. ORBITS: No acute abnormality. Bilateral lens replacement. SINUSES: No acute abnormality. SOFT TISSUES AND SKULL:  No acute soft tissue abnormality. No skull fracture. IMPRESSION: 1. No acute intracranial abnormality. Electronically signed by: Morgane Naveau MD 02/22/2024 12:22 AM EST RP Workstation: [id number]    Medications Ordered in ED Medications - No data to display Procedures Procedures  (including critical care time) Medical Decision Making / ED Course   Medical Decision Making Amount and/or Complexity of Data Reviewed Labs: ordered. Decision-making details documented in ED Course. Radiology: ordered and independent interpretation performed. Decision-making details documented in ED Course. ECG/medicine tests: ordered and independent interpretation performed. Decision-making details documented in ED Course.    Patient presents with worsening of unsteady gait.  And elevated blood pressures.  No focal deficits noted here.  No associated chest pain or shortness of breath.  Differential diagnosis considered and workup below.  Relatively asymptomatic hypertension.  Slightly worsening of unsteady gait likely secondary to patient's neuropathy.  I did order a CT scan to assess for possible posterior/cerebellar infarct given the timeline.  This was negative.  Labs are grossly reassuring without endorgan damage.  Blood pressure improved down to systolics in the 160s without intervention here in the emergency department.    Final Clinical Impression(s) / ED Diagnoses Final diagnoses:  Other secondary hypertension   The patient appears reasonably screened and/or stabilized for discharge and I doubt any other medical condition or other [facility] requiring further screening, evaluation, or treatment in the ED at this time. I have discussed the findings, Dx and Tx plan with the patient/family who expressed understanding and agree(s) with the plan. Discharge instructions discussed at length. The patient/family was given strict return precautions who verbalized understanding of the instructions. No further  questions at time of discharge.  Disposition: Discharge  Condition: Good  ED Discharge Orders     None        Follow Up: [name], NP [address] [place] [address] [phone]  Call  to schedule an appointment for close follow up     This chart was dictated using voice recognition software.  Despite best efforts to proofread,  errors can occur which can change the documentation meaning.    Trine Likes Moder, MD 02/22/24 [phone]

## 2024-02-22 ENCOUNTER — Emergency Department (HOSPITAL_COMMUNITY)

## 2024-02-24 ENCOUNTER — Ambulatory Visit: Payer: Self-pay | Admitting: Nurse Practitioner

## 2024-03-01 ENCOUNTER — Other Ambulatory Visit: Payer: Self-pay | Admitting: Physical Medicine and Rehabilitation

## 2024-03-16 ENCOUNTER — Encounter: Attending: Physical Medicine and Rehabilitation | Admitting: Physical Medicine and Rehabilitation

## 2024-03-16 ENCOUNTER — Encounter: Payer: Self-pay | Admitting: Physical Medicine and Rehabilitation

## 2024-03-16 VITALS — BP 145/88 | HR 67 | Ht 59.0 in | Wt 122.8 lb

## 2024-03-16 DIAGNOSIS — M4800 Spinal stenosis, site unspecified: Secondary | ICD-10-CM | POA: Diagnosis present

## 2024-03-16 DIAGNOSIS — G3184 Mild cognitive impairment, so stated: Secondary | ICD-10-CM | POA: Diagnosis present

## 2024-03-16 DIAGNOSIS — G894 Chronic pain syndrome: Secondary | ICD-10-CM | POA: Diagnosis present

## 2024-03-16 DIAGNOSIS — M48062 Spinal stenosis, lumbar region with neurogenic claudication: Secondary | ICD-10-CM | POA: Insufficient documentation

## 2024-03-16 DIAGNOSIS — M961 Postlaminectomy syndrome, not elsewhere classified: Secondary | ICD-10-CM | POA: Insufficient documentation

## 2024-03-16 MED ORDER — BUPRENORPHINE 5 MCG/HR TD PTWK: 1.0000 | MEDICATED_PATCH | TRANSDERMAL | 5 refills | Status: AC

## 2024-03-16 MED ORDER — METAXALONE 800 MG PO TABS: 400.0000 mg | ORAL_TABLET | Freq: Two times a day (BID) | ORAL | 5 refills | Status: AC | PRN

## 2024-03-16 NOTE — Progress Notes (Signed)
 Subjective:    Patient ID: [name], female    DOB: July 25, 1933, 89 y.o.   MRN: [id number]  HPI Pt is an 89 yr old female with hx of breast CA and RUE lymphedema, as well as uterine prolapse, NICCM- as well as Chronic low back pain with B/L sciatica- also has memory issues on Aricept ; hx of lumbar fusion at L4/5. Has a hx of Bipolar Type II depression. Has ICD pacemaker- due to bradycardia and CHF- Is on Zoloft  200 mg daily, of note, with Bipolar d/o;  Trilpetal and Wellbutrin  for mood. Also has interstitial lung disease   Here for f/u on Lumbar radiculopathy- Also has interstitial lung disease  Butrans  patch has been more helpful than low dose naltrexone  The one day it's put on day, sleepy for ~ 4 hours just on day she puts the patch on.   Much better than low dose naltrexone -  30% better.  Daughter not interested in increasing dose at this time.   Is more foggy- more problems remembering things- than it was with low dose naltrexone - due to side effects, doesn't want to increase dose.    The way she feels, it's better.  When weather changed, felt it in the whole body.  Knows there's nothing we can do to fix this.   In general, feels like making a difference- but fogginess is worse- is day to day.  Has good and bad days- fogginess is more frequent.   Doing her normal tasks regularly. - still able to walk and get along independently- rarely had to help her with her clothes- minimal.  Not impairing her function per pt and her daughter.   Sometimes uses daughter AND small quan cane at home, but usually I with cane. Unless walking further distances where daughter is with her.   Can walk from front of building into PM&R office with just cane.     Still taking Skelaxin  400 mg for 5 days- then take a break for 1 days, then restart it for 5 days- 1x/day.   Just got refill for Lyrica  11/24  Skelaxin  helps her sleep- 400 mg at bedtime   Having more back pain- on R side-  radiates to R hip and goes down to Toes-  Since started Butrans - not as much as used to be.  Doing a little better with meds currently.    Pain Inventory Average Pain 6 Pain Right Now 5 My pain is dull  In the last 24 hours, has pain interfered with the following? General activity 0 Relation with others 0 Enjoyment of life 0 What TIME of day is your pain at its worst? morning  Sleep (in general) NA  Pain is worse with: bending, standing, and some activites Pain improves with: rest, heat/ice, and medication Relief from Meds: 6  Family History  Problem Relation Age of Onset   Hypertension Mother    Arthritis Mother    Hypertension Father    Hypertension Brother    Hypertension Brother    Fibroids Daughter    Social History   Socioeconomic History   Marital status: Married    Spouse name: Not on file   Number of children: 1   Years of education: Masters   Highest education level: Master's degree (e.g., MA, MS, MEng, MEd, MSW, MBA)  Occupational History   Occupation: Retired  Tobacco Use   Smoking status: Never    Passive exposure: Never   Smokeless tobacco: Never  Vaping Use   Vaping  status: Never Used  Substance and Sexual Activity   Alcohol use: No   Drug use: No   Sexual activity: Not Currently    Birth control/protection: Post-menopausal  Other Topics Concern   Not on file  Social History Narrative   Lives at home with husband.   Right-handed.      As of 07/28/2014   Diet: No special diet   Caffeine: yes, Chocolate, tea and sodas    Married: YES, 1970   House: Yes, 2 stories, 2-3 persons live in home   Pets: No   Current/Past profession: Nurse, Mental Health, Publishing Rights Manager    Exercise: Yes 2-3 x weekly   Living Will: Yes   DNR: No   POA/HPOA: No      Social Drivers of Corporate Investment Banker Strain: Low Risk  (09/30/2023)   Overall Financial Resource Strain (CARDIA)    Difficulty of Paying Living Expenses: Not hard at all  Food Insecurity:  No Food Insecurity (09/30/2023)   Hunger Vital Sign    Worried About Running Out of Food in the Last Year: Never true    Ran Out of Food in the Last Year: Never true  Transportation Needs: No Transportation Needs (09/30/2023)   PRAPARE - Administrator, Civil Service (Medical): No    Lack of Transportation (Non-Medical): No  Physical Activity: Unknown (09/30/2023)   Exercise Vital Sign    Days of Exercise per Week: Patient declined    Minutes of Exercise per Session: Not on file  Stress: Patient Declined (09/30/2023)   [employer] of Occupational Health - Occupational Stress Questionnaire    Feeling of Stress: Patient declined  Social Connections: Moderately Integrated (09/30/2023)   Social Connection and Isolation Panel    Frequency of Communication with Friends and Family: Three times a week    Frequency of Social Gatherings with Friends and Family: Patient declined    Attends Religious Services: More than 4 times per year    Active Member of [employer] or Organizations: Yes    Attends Banker Meetings: More than 4 times per year    Marital Status: Widowed   Past Surgical History:  Procedure Laterality Date   BACK SURGERY     lumbar fusion    BIV ICD GENERATOR CHANGEOUT N/A 02/06/2023   Procedure: BIV ICD GENERATOR CHANGEOUT;  Surgeon: [name], MD;  Location: [facility] INVASIVE CV LAB;  Service: Cardiovascular;  Laterality: N/A;   BREAST LUMPECTOMY Left 2008   BREAST SURGERY  2000   LUMP REMOVAL. STAGE 1 CANCER   CARDIAC CATHETERIZATION     CATARACT EXTRACTION     COLONOSCOPY     EYE SURGERY     IMPLANTABLE CARDIOVERTER DEFIBRILLATOR GENERATOR CHANGE N/A 12/18/2012   Procedure: IMPLANTABLE CARDIOVERTER DEFIBRILLATOR GENERATOR CHANGE;  Surgeon: Danelle ORN Waddell, MD;  Location: [facility] CATH LAB;  Service: Cardiovascular;  Laterality: N/A;   IR INJECT/THERA/INC NEEDLE/CATH/PLC EPI/LUMB/SAC W/IMG  06/28/2023   JOINT REPLACEMENT  06/14/01   right   LUMBAR FUSION  2006    MASS EXCISION  11/08/2011   Procedure: EXCISION MASS;  Surgeon: Jina Nephew, MD;  Location: [facility] ORS;  Service: General;  Laterality: Left;  Excision Left Thigh Mass   MASTECTOMY W/ SENTINEL NODE BIOPSY Left 06/04/2019   Procedure: LEFT MASTECTOMY WITH SENTINEL LYMPH NODE BIOPSY;  Surgeon: Nephew Jina, MD;  Location: [facility] OR;  Service: General;  Laterality: Left;   MASTECTOMY, PARTIAL  2008   GOT PACEMAKER AND DEFIB AT  THAT TIME   PACEMAKER INSERTION  04/23/06   TOTAL KNEE ARTHROPLASTY  05/17/01   RIGHT KNEE   TOTAL KNEE ARTHROPLASTY Left 11/29/2014   Procedure: TOTAL LEFT KNEE ARTHROPLASTY;  Surgeon: Donnice Car, MD;  Location: [facility] ORS;  Service: Orthopedics;  Laterality: Left;   Past Surgical History:  Procedure Laterality Date   BACK SURGERY     lumbar fusion    BIV ICD GENERATOR CHANGEOUT N/A 02/06/2023   Procedure: BIV ICD GENERATOR CHANGEOUT;  Surgeon: [name], MD;  Location: [facility] INVASIVE CV LAB;  Service: Cardiovascular;  Laterality: N/A;   BREAST LUMPECTOMY Left 2008   BREAST SURGERY  2000   LUMP REMOVAL. STAGE 1 CANCER   CARDIAC CATHETERIZATION     CATARACT EXTRACTION     COLONOSCOPY     EYE SURGERY     IMPLANTABLE CARDIOVERTER DEFIBRILLATOR GENERATOR CHANGE N/A 12/18/2012   Procedure: IMPLANTABLE CARDIOVERTER DEFIBRILLATOR GENERATOR CHANGE;  Surgeon: Danelle ORN Waddell, MD;  Location: [facility] CATH LAB;  Service: Cardiovascular;  Laterality: N/A;   IR INJECT/THERA/INC NEEDLE/CATH/PLC EPI/LUMB/SAC W/IMG  06/28/2023   JOINT REPLACEMENT  06/14/01   right   LUMBAR FUSION  2006   MASS EXCISION  11/08/2011   Procedure: EXCISION MASS;  Surgeon: Jina Nephew, MD;  Location: [facility] OR[facility];  Service: General;  Laterality: Left;  Excision Left Thigh Mass   MASTECTOMY W/ SENTINEL NODE BIOPSY Left 06/04/2019   Procedure: LEFT MASTECTOMY WITH SENTINEL LYMPH NODE BIOPSY;  Surgeon: Nephew Jina, MD;  Location: [facility] OR;  Service: General;  Laterality: Left;   MASTECTOMY, PARTIAL  2008   GOT PACEMAKER AND DEFIB  AT THAT TIME   PACEMAKER INSERTION  04/23/06   TOTAL KNEE ARTHROPLASTY  05/17/01   RIGHT KNEE   TOTAL KNEE ARTHROPLASTY Left 11/29/2014   Procedure: TOTAL LEFT KNEE ARTHROPLASTY;  Surgeon: Donnice Car, MD;  Location: [facility] OR[facility];  Service: Orthopedics;  Laterality: Left;   Past Medical History:  Diagnosis Date   Arthritis    Cancer (HCC)    left breast cancer    Cataracts, bilateral    removed by surgery   CHF (congestive heart failure) (HCC)    PACEMAKER & DEFIB   Complication of anesthesia    hypotensive after back surgery in 2006   Depression    Dyslipidemia    Fainted 04/21/06   AT [facility]   GERD (gastroesophageal reflux disease)    Headache(784.0)    Hearing loss    bilateral hearing aids   HLD (hyperlipidemia)    diet controlled    Hypertension    Hypothyroidism    ICD (implantable cardiac defibrillator) in place    pt has pacer/icd   ICD (implantable cardiac defibrillator), biventricular, in situ    LBBB (left bundle branch block)    Memory loss    Nonischemic cardiomyopathy (HCC)    Normal coronary arteries    s/p cardiac cath 2007   Pacemaker    ICD [facility]   Syncope    Systolic CHF [facility])    Vertigo    Wears glasses    BP (!) 168/82   Pulse 67   Ht 4' 11 (1.499 m)   Wt 122 lb 12.8 oz (55.7 kg)   LMP  (LMP Unknown)   SpO2 93%   BMI 24.80 kg/m   Opioid Risk Score:   Fall Risk Score:  `1  Depression screen [facility] 2/9     10/28/2023    2:37 PM 08/28/2023   11:57 AM 05/27/2023  10:43 AM 05/01/2023   11:31 AM 01/30/2023   10:38 AM 08/01/2022   10:51 AM 07/09/2022   10:41 AM  Depression screen PH[facility] 2/9  Decreased Interest 0 0 0 1 1 0 0  Down, Depressed, Hopeless 0 0 0 1 1 0 0  PHQ - 2 Score 0 0 0 2 2 0 0  Altered sleeping 0        Tired, decreased energy 1        Change in appetite 0        Feeling bad or failure about yourself  0        Trouble concentrating 0        Moving slowly or fidgety/restless 0        Suicidal thoughts 0        PHQ-9  Score 1            Data saved with a previous flowsheet row definition     Review of Systems  Musculoskeletal:  Positive for back pain and gait problem.  All other systems reviewed and are negative.      Objective:   Physical Exam  Awake, alert, appropriate, accompanied by daughter, using small quad cane, NAD 1+ LE edema to distal calves B/L TTP midline mid-low lumbar - not on sides- just in middle.      Assessment & Plan:   Pt is an 89 yr old female with hx of breast CA and RUE lymphedema, as well as uterine prolapse, NICCM- as well as Chronic low back pain with B/L sciatica- also has memory issues on Aricept ; hx of lumbar fusion at L4/5. Has a hx of Bipolar Type II depression. Has ICD pacemaker- due to bradycardia and CHF- Is on Zoloft  200 mg daily, of note, with Bipolar d/o;  Trilpetal and Wellbutrin  for mood. Also has interstitial lung disease   Here for f/u on Lumbar radiculopathy/ lumbar stenosis with neurogenic claudication- - Also has interstitial lung disease   Will re-eval at each appointment if the fogginess and mild cognitive difficulties having from Butrans  patch are worth it- and if would need to go back to Low dose naltrexone - if/when Dementia gets worse and cannot tolerate  the fogginess, memory issues, then will then go back to low dose natrexone.    2. Low dose naltrexone  can make you feel like in further withdrawal, so would need to be off Butrans  patch for at least 4-5 days before starting low dose naltrexone  back.    3.  Don't come off Butrans  patch until sees me Or is admitted to the hospital for neurological reason- like ie. Stroke, or lethargic due to an infection, then would hold butrans  patch, but otherwise, don't hold it.    4.   Call me if nerve pain radiating down the leg gets worse again- and then can we do Prednisone - 20 mg daily x 4 days- to calm it down.    5.    Con't Skelaxin /Metaxalone  400 mg (1/2 tab) up to 2x/day as needed- mainly takes  1x/day- will refill.   6. F/U in 3 months- on Butrans  patch and chronic back pain with radiculopathy   7. Elevated Blood pressure is NOT form butrans  patch, because it can cause LOWER blood pressure.  So have primary NP look more into BP- has appt in 2 weeks.   If BP is >170 for the upper number, then call Harlene to get in ASAP.    I spent a total of 31  minutes on total care today- >50% coordination of care- due to  d/w pt about pain meds and options esp in setting of mild dementia- also renewal of meds- and discussing pain control.

## 2024-03-16 NOTE — Patient Instructions (Signed)
 Pt is an 89 yr old female with hx of breast CA and RUE lymphedema, as well as uterine prolapse, NICCM- as well as Chronic low back pain with B/L sciatica- also has memory issues on Aricept ; hx of lumbar fusion at L4/5. Has a hx of Bipolar Type II depression. Has ICD pacemaker- due to bradycardia and CHF- Is on Zoloft  200 mg daily, of note, with Bipolar d/o;  Trilpetal and Wellbutrin  for mood. Also has interstitial lung disease   Here for f/u on Lumbar radiculopathy- Also has interstitial lung disease   Will re-eval at each appointment if the fogginess and mild cognitive difficulties having from Butrans  patch are worth it- and if would need to go back to Low dose naltrexone - if/when Dementia gets worse and cannot tolerate  the fogginess, memory issues, then will then go back to low dose natrexone.    2. Low dose naltrexone  can make you feel like in further withdrawal, so would need to be off Butrans  patch for at least 4-5 days before starting low dose naltrexone  back.    3.  Don't come off Butrans  patch until sees me Or is admitted to the hospital for neurological reason- like ie. Stroke, or lethargic due to an infection, then would hold butrans  patch, but otherwise, don't hold it.    4.   Call me if nerve pain radiating down the leg gets worse again- and then can we do Prednisone - 20 mg daily x 4 days- to calm it down.    5.    Con't Skelaxin /Metaxalone  400 mg (1/2 tab) up to 2x/day as needed- mainly takes 1x/day- will refill.   6. F/U in 3 months- on Butrans  patch and chronic back pain with radiculopathy   7. Elevated Blood pressure is NOT form butrans  patch, because it can cause LOWER blood pressure.  So have primary NP look more into BP- has appt in 2 weeks.   If BP is >170 for the upper number, then call Harlene to get in ASAP.

## 2024-03-24 ENCOUNTER — Encounter: Payer: Self-pay | Admitting: Obstetrics and Gynecology

## 2024-03-24 ENCOUNTER — Ambulatory Visit: Admitting: Obstetrics and Gynecology

## 2024-03-24 VITALS — BP 129/74 | HR 65

## 2024-03-24 DIAGNOSIS — N8110 Cystocele, unspecified: Secondary | ICD-10-CM

## 2024-03-24 DIAGNOSIS — N3281 Overactive bladder: Secondary | ICD-10-CM

## 2024-03-24 DIAGNOSIS — N952 Postmenopausal atrophic vaginitis: Secondary | ICD-10-CM

## 2024-03-24 MED ORDER — MIRABEGRON ER 50 MG PO TB24: 50.0000 mg | ORAL_TABLET | Freq: Every day | ORAL | 11 refills | Status: AC

## 2024-03-24 MED ORDER — TROSPIUM CHLORIDE 20 MG PO TABS: 20.0000 mg | ORAL_TABLET | Freq: Every day | ORAL | 11 refills | Status: AC

## 2024-03-24 NOTE — Progress Notes (Signed)
 [facility] Return Visit  SUBJECTIVE  History of Present Illness: [name] is a 89 y.o. female seen in follow-up for mixed incontinence. Had intravesical botox  on 09/26/23. Also had second urethral bulking on 05/16/23. Also on daily valtex for suppression of genital HSV flares.   Now back to flooding urine. Has more issues with leakage during the day but still up several times per night. She tried the trospium  60mg  for about 2 weeks and did not feel it was helping so stopped it. She also reports it made her feel foggy.  Previously tried trospium , myrbetriq , gemtesa  and vesicare for OAB. Has a pacemaker and not eligible for PTNS.   Past Medical History: Patient  has a past medical history of Arthritis, Cancer (HCC), Cataracts, bilateral, CHF (congestive heart failure) (HCC), Complication of anesthesia, Depression, Dyslipidemia, Fainted (04/21/06), GERD (gastroesophageal reflux disease), Headache(784.0), Hearing loss, HLD (hyperlipidemia), Hypertension, Hypothyroidism, ICD (implantable cardiac defibrillator) in place, ICD (implantable cardiac defibrillator), biventricular, in situ, LBBB (left bundle branch block), Memory loss, Nonischemic cardiomyopathy (HCC), Normal coronary arteries, Pacemaker, Syncope, Systolic CHF (HCC), Vertigo, and Wears glasses.   Past Surgical History: She  has a past surgical history that includes Total knee arthroplasty (05/17/01); Lumbar fusion (2006); Mastectomy, partial (2008); Breast surgery (2000); Joint replacement (06/14/01); Pacemaker insertion (04/23/06); Back surgery; Cardiac catheterization; Mass excision (11/08/2011); implantable cardioverter defibrillator generator change (N/A, 12/18/2012); Cataract extraction; Eye surgery; Total knee arthroplasty (Left, 11/29/2014); Breast lumpectomy (Left, 2008); Colonoscopy; Mastectomy w/ sentinel node biopsy (Left, 06/04/2019); BIV ICD GENERATOR CHANGEOUT (N/A, 02/06/2023); and IR INJECT DIAG/THERA/INC  NEEDLE/CATH/PLC EPI/LUMB/SAC W/IMG (06/28/2023).   Medications: She has a current medication list which includes the following prescription(s): albuterol , ascorbic acid, buprenorphine , bupropion , buspirone , carvedilol , vitamin d3, clobetasol  propionate e, diclofenac  sodium, diphenhydramine , duloxetine, estradiol , estradiol , famotidine, fluconazole , furosemide , hypromellose, levothyroxine , lidocaine , lidocaine , lubiprostone , magnesium , metaxalone , mirabegron  er, pumpkin seed  oil, nitrofurantoin  (macrocrystal-monohydrate), NONFORMULARY OR COMPOUNDED ITEM, fish oil, omeprazole , systane, polyethylene glycol 3350 , pregabalin , senna-docusate, spironolactone , telmisartan , trospium , valacyclovir , and vitamin e, and the following Facility-Administered Medications: denosumab .   Allergies: Patient is allergic to iodinated contrast media, iodine , shellfish allergy, strawberry extract, memantine , aspirin , and codeine.   Social History: Patient  reports that she has never smoked. She has never been exposed to tobacco smoke. She has never used smokeless tobacco. She reports that she does not drink alcohol and does not use drugs.     OBJECTIVE     Physical Exam: Vitals:   03/24/24 1403  BP: 129/74  Pulse: 65     Gen: No apparent distress, A&O x 3. Clitoral hood with 2 HSV scars but healing well.  Pessary removed and cleaned. Speculum exam revealed abrasions at the vaginal apex but no active bleeding, no blood present. Estring  placed. Pessary replaced.    ASSESSMENT AND PLAN    [name] is a 89 y.o. with:  1. Overactive bladder   2. Vaginal atrophy   3. Prolapse of anterior vaginal wall     - Can strop estrogen cream. Will replace estring  q 3 months with pessary cleanings.  - continue taking valtex suppression.  - For OAB, symptoms have likely increased since botox  has worn off.  Will decrease dose of tropsium to 20mg  daily and then add Myrbetriq . Previously only had partial response with both  medications.   Return 4-6 weeks    [name], MD

## 2024-03-24 NOTE — Patient Instructions (Signed)
 Start tropium 20mg  daily with Myrbetriq  50mg  daily for overactive bladder symptoms. Call with any concerns.

## 2024-03-25 ENCOUNTER — Other Ambulatory Visit

## 2024-04-05 NOTE — Progress Notes (Signed)
 "   Dementia due to Alzheimer's disease    [name] is a very pleasant 89 y.o. 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 Patient is no longer on antidementia medication due to undesirable side effects, not interested in trying galantamine/ ***.  This patient is accompanied in the office by her daughter *** who supplements the history.  Previous records as well as any outside records available were reviewed prior to todays visit. Patient was last seen on 08/01/23***. Memory is stable ***. MMSE today is  /30 despite subjective complaints ***. Patient is able to participate on ADLs and attends [employer] 3 times a week. No longer drives.    Follow up in  Recommend good control of cardiovascular risk factors.   Continue to control mood as per PCP Continue attending [facility] at [facility]  Discussed the use of AI scribe software for clinical note transcription with the patient, who gave verbal consent to proceed.  History of Present Illness [name] is an 89 year old female who presents with worsening memory and confusion. She is accompanied by her daughter, who is her primary caregiver.  She experiences a decline in memory, particularly in recalling information and word-finding. Confusion with events such as the day or time is noted, along with difficulty understanding new information. Long-term memory is also affected, with occasional difficulty recognizing family members or familiar places. She attends an adult day program three times a week, which aids in socialization and memory maintenance.  She has been using a Butrans  patch for pain management for the past two months, which may have contributed to her memory decline. Her mood has improved  following adjustments to her psychiatric medications, addressing previous tolerance issues. No hallucinations or paranoia are reported.  Sleep has improved since the placement of an estrogen ring, reducing nocturnal bathroom visits. She frequently dreams but does not remember them upon waking. No history of sleep apnea or need for a CPAP machine is reported.  She manages her medications independently and lives with her daughter, who assists with finances. Appetite has decreased, and she sometimes forgets to drink water  or take nutritional supplements. Cooking is done under her daughter's supervision, with no kitchen accidents reported.  She has a history of sciatica and neuropathy, with consistent pain levels. Due to her pacemaker and heart condition, surgical options for back pain are limited. No falls, head injuries, or unilateral weakness are reported.  In November, she visited the ER for hypertensive urgency with head pain. A CT scan showed no evidence of stroke. Bowel movements are normal, and she uses a pessary and incontinence products. She participates in chair Zumba for physical activity.      Any changes in memory since last visit? It may be worse, with some good and bad days -she reports.  Sometimes she has difficulty with comprehension, names and recent conversations. LTM is getting bad too-she says.  She attends ADP 3 times a week and enjoys it very much. repeats oneself?  Denies.  Disoriented when walking into a room?  Patient denies    Leaving objects?  May misplace things but not in unusual places   Wandering behavior?  Denies.   Any personality changes since last visit?  Denies.   Any worsening depression?: She has more down moments. Hs not  seen her psych in a while.  Hallucinations or paranoia?  Denies.   Seizures? Denies.    Any sleep changes?  Not good, because she has to get up to urinate Denies vivid dreams, REM behavior or sleepwalking   Sleep apnea?   Denies.    Any hygiene concerns? Denies.  Independent of bathing and dressing?  Endorsed  Does the patient needs help with medications?  Patient is in charge   Who is in charge of the finances?  Daughter is in charge     Any changes in appetite?  Denies.     Patient have trouble swallowing? Denies.   Does the patient cook? No Any headaches?   Rarely   Chronic back pain May have some sciatica, has recently been referred to PT in 1-2 weeks. To be seen bu [facility] thereafter  Ambulates with difficulty? Denies.  She enjoys chair Zumba and uses a cane which she has named Ellender  Recent falls or head injuries? Denies.     Unilateral weakness, numbness or tingling? neuropathy Any tremors?  Denies   Any anosmia?  Denies   Any incontinence of urine?  She has a history of OAB and has a pessary.  She has to wake up at night to urinate at least twice.  She is followed by urology.  She uses [facility].   Any bowel dysfunction?   Denies      Patient lives with her daughter  Does the patient drive? No longer drives       History on Initial Assessment 10/13/2020: This is an 89 year old right-handed woman with a history of hypertension, hyperlipidemia, hypothyroidism, LBBB s/p ICD placement, breast cancer s/p mastectomy, chronic back pain, bipolar depression, anxiety, presenting for evaluation of memory loss. She had been following with [facility] for memory loss since May 08, 2014, records were reviewed. She started noticing mild short-term memory changes in 2002-05-08 after lumbar surgery. Over the years, memory changes have progressed, her daughter moved in with her in 05/09/19 and she stopped driving. She had side effects on Donepezil , Rivastigmine , and Memantine .    She states her memory is terrible. [name] reports memory changes became more noticeable after her husband passed away in May 08, 2018. She had left the stove on a couple of times and left keys outside the door. She would forget what she was going to say and forget conversations  from the day prior. She would repeat herself at times. She manages her own medications, [name] checks behind her. Her husband was managing finances, [name] took over when he passed away. She stopped driving in Jan/Feb 2021 because she was having staggering gait. She is independent with dressing and bathing. Notes from [facility] indicate she was tried on Donepezil  in 05-09-2015, Memantine  in 05/08/16, Rivastigmine  in 08-May-2017. She felt like a mummy, could not think.    She denies any headaches, diplopia, dysarthria/dysphagia, neck pain, focal numbness/tingling/weakness, anosmia. She has chronic back pain and constipation. She has occasional dizziness. There are tremors in both hands affecting handwriting. Sleep is okay, she usually sleeps 8 hours at night. Her last fall was in 2022-01-30when she fell off the bed. She had worked with physical therapy and staggering gait has stabilized. Mood is sometimes stable, she is more irritable with herself when she could not remember things. [name] notices she is more anxious when she has to leave. No hallucinations or paranoia. No family history of dementia, history of significant head injuries, or alcohol use.  PREVIOUS MEDICATIONS: Donepezil  in 2017, memantine  2018, rivastigmine  2019 (all of them reported to feel like a mummy, I could not think)        04/07/2024    1:00 PM 08/01/2023    5:00 PM 05/27/2023   10:53 AM  MMSE - Mini Mental State Exam  Orientation to time 5 5 5   Orientation to Place 5 5 5   Registration 3 3 3   Attention/ Calculation 5 5 3   Recall 3 3 3   Language- name 2 objects 2 2 2   Language- repeat 1 1 1   Language- follow 3 step command 3 3 3   Language- read & follow direction 1 1 1   Write a sentence 1 1 1   Copy design 1 1 1   Total score 30 30 28       10/13/2020   10:00 AM  Montreal Cognitive Assessment   Visuospatial/ Executive (0/5) 2  Naming (0/3) 3  Attention: Read list of digits (0/2) 2  Attention: Read list of letters (0/1) 1   Attention: Serial 7 subtraction starting at 100 (0/3) 2  Language: Repeat phrase (0/2) 1  Language : Fluency (0/1) 1  Abstraction (0/2) 1  Delayed Recall (0/5) 2  Orientation (0/6) 6  Total 21  Adjusted Score (based on education) 21      Objective:    Neurological Exam:    VITALS:   Vitals:   04/07/24 1254  BP: (!) 150/76  Pulse: 74  SpO2: 96%  Weight: 121 lb (54.9 kg)  Height: 4' 11 (1.499 m)    GEN:  The patient appears stated age and is in NAD. HEENT:  Normocephalic, atraumatic.   Neurological examination:  General: NAD, well-groomed, appears stated age. Orientation: The patient is alert. Oriented to person, place and date Cranial nerves: There is good facial symmetry.The speech is fluent and clear. No aphasia or dysarthria. Fund of knowledge is appropriate. Recent and remote memory are impaired. Attention and concentration are reduced. Able to name objects and repeat phrases.  Hearing is intact to conversational tone. *** Sensation: Sensation is intact to light touch throughout Motor: Strength is at least antigravity x4. DTR's 2/4 in UE/LE     Movement examination:  Tone: There is normal tone in the UE/LE Abnormal movements:  no tremor.  No myoclonus.  No asterixis.   Coordination:  There is no decremation with RAM's. Normal finger to nose  Gait and Station: The patient has no*** difficulty arising out of a deep-seated chair without the use of the hands. The patient's stride length is good.  Gait is cautious and narrow.    Thank you for allowing us  the opportunity to participate in the care of this nice patient. Please do not hesitate to contact us  for any questions or concerns.   Total time spent on today's visit was *** minutes dedicated to this patient today, preparing to see patient, examining the patient, ordering tests and/or medications and counseling the patient, documenting clinical information in the EHR or other health record, independently  interpreting results and communicating results to the patient/family, discussing treatment and goals, answering patient's questions and coordinating care.  Cc:  [name], NP  Camie Sevin 04/07/2024 5:55 PM      "

## 2024-04-06 ENCOUNTER — Encounter: Payer: Self-pay | Admitting: Nurse Practitioner

## 2024-04-06 ENCOUNTER — Ambulatory Visit (INDEPENDENT_AMBULATORY_CARE_PROVIDER_SITE_OTHER): Admitting: Nurse Practitioner

## 2024-04-06 ENCOUNTER — Other Ambulatory Visit: Payer: Self-pay | Admitting: Nurse Practitioner

## 2024-04-06 VITALS — BP 110/62 | HR 71 | Temp 97.0°F | Ht 59.0 in | Wt 123.0 lb

## 2024-04-06 DIAGNOSIS — N3281 Overactive bladder: Secondary | ICD-10-CM | POA: Diagnosis not present

## 2024-04-06 DIAGNOSIS — K5900 Constipation, unspecified: Secondary | ICD-10-CM | POA: Diagnosis not present

## 2024-04-06 DIAGNOSIS — N390 Urinary tract infection, site not specified: Secondary | ICD-10-CM

## 2024-04-06 DIAGNOSIS — F419 Anxiety disorder, unspecified: Secondary | ICD-10-CM

## 2024-04-06 DIAGNOSIS — M810 Age-related osteoporosis without current pathological fracture: Secondary | ICD-10-CM | POA: Diagnosis not present

## 2024-04-06 DIAGNOSIS — E039 Hypothyroidism, unspecified: Secondary | ICD-10-CM | POA: Diagnosis not present

## 2024-04-06 DIAGNOSIS — N952 Postmenopausal atrophic vaginitis: Secondary | ICD-10-CM

## 2024-04-06 DIAGNOSIS — M48062 Spinal stenosis, lumbar region with neurogenic claudication: Secondary | ICD-10-CM | POA: Diagnosis not present

## 2024-04-06 DIAGNOSIS — I10 Essential (primary) hypertension: Secondary | ICD-10-CM | POA: Diagnosis not present

## 2024-04-06 DIAGNOSIS — K5904 Chronic idiopathic constipation: Secondary | ICD-10-CM

## 2024-04-06 NOTE — Patient Instructions (Signed)
 Due for tetanus can obtain at local pharmacy.

## 2024-04-06 NOTE — Progress Notes (Unsigned)
 "   Careteam: Patient Care Team: [name], NP as PCP - General (Geriatric Medicine) [name], MD as PCP - Electrophysiology (Cardiology) [name], MD as Consulting Physician (General Surgery) [name], MD as Consulting Physician (Orthopedic Surgery) [name], OD (Optometry) [name], MD as Consulting Physician (Neurology) [name], MD as Consulting Physician (Cardiology) [name], MD (Psychiatry) [name], MD as Consulting Physician (Pulmonary Disease)  PLACE OF SERVICE:  [facility]  Advanced Directive information    Allergies[1]  Chief Complaint  Patient presents with   medication management of chronic issues    Patient presents today for routine 3 month follow up. Care gaps have been addressed.     HPI:  Discussed the use of AI scribe software for clinical note transcription with the patient, who gave verbal consent to proceed.  History of Present Illness [name] is an 89 year old female who presents for a three-month follow-up after an ER visit for elevated blood pressure.  In November, she experienced a hypertensive episode with a home blood pressure reading of 205/103 mmHg, prompting an ER visit. Upon arrival, her blood pressure was 185/90 mmHg and decreased to 157/73 mmHg without intervention. Since then, her blood pressure has remained stable and well-controlled on carvedilol  3.125 mg twice daily, spironolactone  25 mg daily, and telmisartan  20 mg daily. She has not missed any doses and there are no known contributing factors to the previous spike in blood pressure. During the ER visit, she also experienced a slight headache.  She has ongoing low back pain due to spinal stenosis, managed with a Butrans  patch, which has improved her pain control. She describes the pain as 'not as severe' and more tolerable. She is also on Lyrica  for pain management, taking it twice daily.  She experiences neuropathy  in her feet, most bothersome at night and upon waking in the morning, sometimes waking her up. She is taking Lyrica  twice daily to manage these symptoms.  She is managing mood disorders with Wellbutrin  150 mg daily, Buspar  5 mg twice daily, and Cymbalta 60 mg. She sees a psychiatrist annually for her mood management.  For bowel management, she takes Senokot-S two tablets daily and Amitiza  8 mg daily, reporting regular bowel movements.  She is on Myrbetriq  for overactive bladder and Macrobid  daily for recurrent urinary tract infections. She recently started using an estradiol  ring for vaginal atrophy, inserted a couple of weeks ago, and is awaiting further evaluation of its effectiveness.  She is receiving Prolia  injections for osteoporosis. She is no longer taking calcium and continues with vitamin D  supplementation.  She is on Synthroid  88 mcg for thyroid management.  ***  Review of Systems:  ROS***  Past Medical History:  Diagnosis Date   Arthritis    Cancer (HCC)    left breast cancer    Cataracts, bilateral    removed by surgery   CHF (congestive heart failure) (HCC)    PACEMAKER & DEFIB   Complication of anesthesia    hypotensive after back surgery in 2006   Depression    Dyslipidemia    Fainted 04/21/06   AT [facility]   GERD (gastroesophageal reflux disease)    Headache(784.0)    Hearing loss    bilateral hearing aids   HLD (hyperlipidemia)    diet controlled    Hypertension    Hypothyroidism    ICD (implantable cardiac defibrillator) in place    pt has pacer/icd   ICD (  implantable cardiac defibrillator), biventricular, in situ    LBBB (left bundle branch block)    Memory loss    Nonischemic cardiomyopathy (HCC)    Normal coronary arteries    s/p cardiac cath 2007   Pacemaker    ICD [employer]   Syncope    Systolic CHF [facility])    Vertigo    Wears glasses    Past Surgical History:  Procedure Laterality Date   BACK SURGERY     lumbar fusion    BIV ICD  GENERATOR CHANGEOUT N/A 02/06/2023   Procedure: BIV ICD GENERATOR CHANGEOUT;  Surgeon: [name], MD;  Location: [facility] INVASIVE CV LAB;  Service: Cardiovascular;  Laterality: N/A;   BREAST LUMPECTOMY Left 2008   BREAST SURGERY  2000   LUMP REMOVAL. STAGE 1 CANCER   CARDIAC CATHETERIZATION     CATARACT EXTRACTION     COLONOSCOPY     EYE SURGERY     IMPLANTABLE CARDIOVERTER DEFIBRILLATOR GENERATOR CHANGE N/A 12/18/2012   Procedure: IMPLANTABLE CARDIOVERTER DEFIBRILLATOR GENERATOR CHANGE;  Surgeon: Danelle ORN Waddell, MD;  Location: [facility] CATH LAB;  Service: Cardiovascular;  Laterality: N/A;   IR INJECT/THERA/INC NEEDLE/CATH/PLC EPI/LUMB/SAC W/IMG  06/28/2023   JOINT REPLACEMENT  06/14/01   right   LUMBAR FUSION  2006   MASS EXCISION  11/08/2011   Procedure: EXCISION MASS;  Surgeon: Jina Nephew, MD;  Location: [facility] ORS;  Service: General;  Laterality: Left;  Excision Left Thigh Mass   MASTECTOMY W/ SENTINEL NODE BIOPSY Left 06/04/2019   Procedure: LEFT MASTECTOMY WITH SENTINEL LYMPH NODE BIOPSY;  Surgeon: Nephew Jina, MD;  Location: [facility] OR;  Service: General;  Laterality: Left;   MASTECTOMY, PARTIAL  2008   GOT PACEMAKER AND DEFIB AT THAT TIME   PACEMAKER INSERTION  04/23/06   TOTAL KNEE ARTHROPLASTY  05/17/01   RIGHT KNEE   TOTAL KNEE ARTHROPLASTY Left 11/29/2014   Procedure: TOTAL LEFT KNEE ARTHROPLASTY;  Surgeon: Donnice Car, MD;  Location: [facility] ORS;  Service: Orthopedics;  Laterality: Left;   Social History:   reports that she has never smoked. She has never been exposed to tobacco smoke. She has never used smokeless tobacco. She reports that she does not drink alcohol and does not use drugs.  Family History  Problem Relation Age of Onset   Hypertension Mother    Arthritis Mother    Hypertension Father    Hypertension Brother    Hypertension Brother    Fibroids Daughter     Medications: Patient's Medications  New Prescriptions   No medications on file  Previous Medications   ALBUTEROL   (VENTOLIN  HFA) 108 (90 BASE) MCG/ACT INHALER    Inhale 2 puffs into the lungs every 6 (six) hours as needed for wheezing or shortness of breath.   ASCORBIC ACID (VITAMIN C) 500 MG TABLET    Take 500 mg by mouth daily.   BUPRENORPHINE  (BUTRANS ) 5 MCG/HR PTWK    Place 1 patch onto the skin once a week.   BUPROPION  (WELLBUTRIN  XL) 150 MG 24 HR TABLET    Take 1 tablet (150 mg total) by mouth daily.   BUSPIRONE  (BUSPAR ) 5 MG TABLET    Take 1 tablet (5 mg total) by mouth 2 (two) times daily.   CARVEDILOL  (COREG ) 3.125 MG TABLET    TAKE 1 TABLET BY MOUTH TWICE  DAILY   CHOLECALCIFEROL (VITAMIN D3) 50 MCG (2000 UT) TABS    Take 2,000 Units by mouth daily.    CLOBETASOL  PROP EMOLLIENT BASE (CLOBETASOL   PROPIONATE E) 0.05 % EMOLLIENT CREAM    Apply 1 Application topically at bedtime.   DICLOFENAC  SODIUM (PENNSAID ) 2 % SOLN    Apply 2 Pump (40 mg total) topically 2 (two) times daily as needed. UP TO TWO PAINFUL AREAS TWICE A DAY   DIPHENHYDRAMINE  (BENADRYL ) 50 MG TABLET    50 mg oral diphenhydramine  administered one hour prior to contrast administration.   DULOXETINE (CYMBALTA) 60 MG CAPSULE    Take 60 mg by mouth daily.   ESTRADIOL  (ESTRACE ) 0.1 MG/GM VAGINAL CREAM    Place 0.5g nightly for two weeks then twice a week after   ESTRADIOL  (ESTRING ) 7.5 MCG/24HR VAGINAL RING    Place 1 each vaginally every 3 (three) months.   FAMOTIDINE (PEPCID) 10 MG TABLET    Take 10 mg by mouth daily as needed for heartburn or indigestion.   FLUCONAZOLE  (DIFLUCAN ) 150 MG TABLET    Take 150 mg by mouth once.   FUROSEMIDE  (LASIX ) 20 MG TABLET    Take 1 tablet (20 mg total) by mouth daily as needed.   HYPROMELLOSE (GENTEAL) 0.3 % GEL OPHTHALMIC OINTMENT    Apply 1 drop to eye.   LEVOTHYROXINE  (SYNTHROID ) 88 MCG TABLET    Take 1 tablet (88 mcg total) by mouth daily.   LIDOCAINE  (XYLOCAINE ) 2 % JELLY    Apply 1 Application topically as needed.   LIDOCAINE  4 % PTCH    Apply 1 patch topically every 12 (twelve) hours. Apply to back    LUBIPROSTONE  (AMITIZA ) 8 MCG CAPSULE    TAKE ONE CAPSULE BY MOUTH TWICE DAILY WITH A MEAL   MAGNESIUM  250 MG TABS    Take 250 mg by mouth daily.   METAXALONE  (SKELAXIN ) 800 MG TABLET    Take 0.5 tablets (400 mg total) by mouth 2 (two) times daily as needed for muscle spasms.   MIRABEGRON  ER (MYRBETRIQ ) 50 MG TB24 TABLET    Take 1 tablet (50 mg total) by mouth daily.   MISC NATURAL PRODUCTS (PUMPKIN SEED  OIL) CAPS    Take 1 capsule by mouth daily.   NITROFURANTOIN , MACROCRYSTAL-MONOHYDRATE, (MACROBID ) 100 MG CAPSULE    Take 1 capsule (100 mg total) by mouth daily.   NONFORMULARY OR COMPOUNDED ITEM    Apply 120 Tubes topically daily. Diclofenac /Cyclobenzaprine/lamotrigine/lidocaine /prilocaine  (10480) 2%/2%/6%/5%/1.25% cream QTY: 120 GM SIG: (NEURO) APPLY 1-2 PUMPS (1-2 GMS) TO AFFECTED AREA (S) OR FOCAL POINTS 3 TO 4 TIMES DAILY. RUB IN FOR 2 MINUTES TO ACHIEVE MAX PENETRATION. WASH HANDS WELL.   OMEGA-3 FATTY ACIDS (FISH OIL) 1200 MG CAPS    Take 1,200 mg by mouth daily.   OMEPRAZOLE  (PRILOSEC) 40 MG CAPSULE    TAKE 1 CAPSULE BY MOUTH DAILY   POLYETHYL GLYCOL-PROPYL GLYCOL (SYSTANE) 0.4-0.3 % SOLN    Place 1 drop into both eyes at bedtime.   POLYETHYLENE GLYCOL 3350  PO    Take 1 Capful by mouth daily.   PREGABALIN  (LYRICA ) 25 MG CAPSULE    Take 1 capsule (25 mg total) by mouth 2 (two) times daily.   SENNA-DOCUSATE (SENOKOT S) 8.6-50 MG TABLET    Take 2 tablets by mouth daily.   SPIRONOLACTONE  (ALDACTONE ) 25 MG TABLET    TAKE 1 TABLET BY MOUTH IN THE  MORNING   TELMISARTAN  (MICARDIS ) 20 MG TABLET    Take 1 tablet (20 mg total) by mouth daily.   TROSPIUM  (SANCTURA ) 20 MG TABLET    Take 1 tablet (20 mg total) by mouth daily.   VALACYCLOVIR  (VALTREX )  1000 MG TABLET    Take 1 tablet (1,000 mg total) by mouth daily.   VITAMIN E 400 UNITS TABS    Take 400 Units by mouth daily.   Modified Medications   No medications on file  Discontinued Medications   No medications on file    Physical  Exam:  Vitals:   04/06/24 1419  BP: 110/62  Pulse: 71  Temp: (!) 97 F (36.1 C)  SpO2: 98%  Weight: 123 lb (55.8 kg)  Height: 4' 11 (1.499 m)   Body mass index is 24.84 kg/m. Wt Readings from Last 3 Encounters:  04/06/24 123 lb (55.8 kg)  03/16/24 122 lb 12.8 oz (55.7 kg)  01/09/24 119 lb (54 kg)    Physical Exam***  Labs reviewed: Basic Metabolic Panel: Recent Labs    05/27/23 1110 08/19/23 0927 11/20/23 1011 02/21/24 2044  NA 134* 134* 135 135  K 4.6 4.1 4.3 4.7  CL 98 101 98 98  CO2 30 31 29 29   GLUCOSE 110 108* 93 91  BUN 21 22 16 14   CREATININE 0.81 0.76 0.83 0.66  CALCIUM 10.1 9.8 10.3 11.5*  TSH 0.73  --   --   --    Liver Function Tests: Recent Labs    08/19/23 0927 11/20/23 1011 02/21/24 2044  AST 19 21 25   ALT 16 17 16   ALKPHOS 39  --  53  BILITOT 0.4 0.5 0.3  PROT 7.0 6.9 7.6  ALBUMIN 4.0  --  4.3   No results for input(s): LIPASE, AMYLASE in the last 8760 hours. No results for input(s): AMMONIA in the last 8760 hours. CBC: Recent Labs    05/27/23 1110 08/19/23 0927 02/21/24 2044  WBC 7.3 9.0 8.1  NEUTROABS 4,665 5.9 4.5  HGB 11.8 12.4 13.1  HCT 37.0 36.5 39.9  MCV 89.8 87.1 88.3  PLT 142 135* 152   Lipid Panel: No results for input(s): CHOL, HDL, LDLCALC, TRIG, CHOLHDL, LDLDIRECT in the last 8760 hours. TSH: Recent Labs    05/27/23 1110  TSH 0.73   A1C: No results found for: HGBA1C   Assessment/Plan *** There are no diagnoses linked to this encounter.   No follow-ups on file.: ***  Rowland Ericsson K. Caro BODILY [facility] [phone]     [1]  Allergies Allergen Reactions   Iodinated Contrast Media Shortness Of Breath    Requires pre-medication   Iodine  Shortness Of Breath    Iodine  contrast, CHF , SOB   Shellfish Allergy Shortness Of Breath   Strawberry Extract Itching   Memantine      Malaise, fogginess/ couldn't think   Aspirin  Nausea And Vomiting   Codeine  Nausea And Vomiting and Other (See Comments)    Other Reaction(s): Not available   "

## 2024-04-07 ENCOUNTER — Encounter: Payer: Self-pay | Admitting: Physician Assistant

## 2024-04-07 ENCOUNTER — Ambulatory Visit (INDEPENDENT_AMBULATORY_CARE_PROVIDER_SITE_OTHER): Admitting: Physician Assistant

## 2024-04-07 VITALS — BP 150/76 | HR 74 | Ht 59.0 in | Wt 121.0 lb

## 2024-04-07 DIAGNOSIS — G301 Alzheimer's disease with late onset: Secondary | ICD-10-CM

## 2024-04-07 DIAGNOSIS — F02A0 Dementia in other diseases classified elsewhere, mild, without behavioral disturbance, psychotic disturbance, mood disturbance, and anxiety: Secondary | ICD-10-CM

## 2024-04-07 NOTE — Patient Instructions (Signed)
  You are doing great! Continue atendance to the Adult Day Program  Follow in 6 months

## 2024-04-08 ENCOUNTER — Other Ambulatory Visit: Payer: Self-pay | Admitting: Obstetrics and Gynecology

## 2024-04-10 ENCOUNTER — Other Ambulatory Visit: Payer: Self-pay | Admitting: Obstetrics and Gynecology

## 2024-04-14 ENCOUNTER — Other Ambulatory Visit: Payer: Self-pay | Admitting: Internal Medicine

## 2024-04-14 LAB — HM MAMMOGRAPHY

## 2024-04-15 ENCOUNTER — Encounter: Payer: Self-pay | Admitting: Nurse Practitioner

## 2024-04-16 ENCOUNTER — Ambulatory Visit (HOSPITAL_BASED_OUTPATIENT_CLINIC_OR_DEPARTMENT_OTHER)
Admission: RE | Admit: 2024-04-16 | Discharge: 2024-04-16 | Disposition: A | Discharge status: Home / Self Care | Source: Ambulatory Visit | Attending: Nurse Practitioner | Admitting: Nurse Practitioner

## 2024-04-16 ENCOUNTER — Ambulatory Visit: Payer: Self-pay | Admitting: Nurse Practitioner

## 2024-04-16 DIAGNOSIS — E2839 Other primary ovarian failure: Secondary | ICD-10-CM | POA: Insufficient documentation

## 2024-04-20 ENCOUNTER — Encounter: Payer: Self-pay | Admitting: *Deleted

## 2024-04-21 ENCOUNTER — Other Ambulatory Visit: Payer: Self-pay

## 2024-04-21 DIAGNOSIS — C50912 Malignant neoplasm of unspecified site of left female breast: Secondary | ICD-10-CM

## 2024-04-22 ENCOUNTER — Inpatient Hospital Stay: Attending: Hematology

## 2024-04-22 ENCOUNTER — Inpatient Hospital Stay (HOSPITAL_BASED_OUTPATIENT_CLINIC_OR_DEPARTMENT_OTHER): Admitting: Hematology

## 2024-04-22 VITALS — BP 138/82 | HR 80 | Temp 97.5°F | Resp 18 | Wt 121.6 lb

## 2024-04-22 DIAGNOSIS — C50912 Malignant neoplasm of unspecified site of left female breast: Secondary | ICD-10-CM

## 2024-04-22 DIAGNOSIS — Z171 Estrogen receptor negative status [ER-]: Secondary | ICD-10-CM | POA: Diagnosis not present

## 2024-04-22 LAB — CBC WITH DIFFERENTIAL (CANCER CENTER ONLY)
Abs Immature Granulocytes: 0.03 K/uL (ref 0.00–0.07)
Basophils Absolute: 0.1 K/uL (ref 0.0–0.1)
Basophils Relative: 1 %
Eosinophils Absolute: 0.2 K/uL (ref 0.0–0.5)
Eosinophils Relative: 2 %
HCT: 40.2 % (ref 36.0–46.0)
Hemoglobin: 13.4 g/dL (ref 12.0–15.0)
Immature Granulocytes: 0 %
Lymphocytes Relative: 17 %
Lymphs Abs: 1.7 K/uL (ref 0.7–4.0)
MCH: 28.9 pg (ref 26.0–34.0)
MCHC: 33.3 g/dL (ref 30.0–36.0)
MCV: 86.8 fL (ref 80.0–100.0)
Monocytes Absolute: 1 K/uL (ref 0.1–1.0)
Monocytes Relative: 10 %
Neutro Abs: 7.1 K/uL (ref 1.7–7.7)
Neutrophils Relative %: 70 %
Platelet Count: 152 K/uL (ref 150–400)
RBC: 4.63 MIL/uL (ref 3.87–5.11)
RDW: 14.9 % (ref 11.5–15.5)
WBC Count: 10.1 K/uL (ref 4.0–10.5)
nRBC: 0 % (ref 0.0–0.2)

## 2024-04-22 LAB — CMP (CANCER CENTER ONLY)
ALT: 14 U/L (ref 0–44)
AST: 24 U/L (ref 15–41)
Albumin: 4.3 g/dL (ref 3.5–5.0)
Alkaline Phosphatase: 66 U/L (ref 38–126)
Anion gap: 10 (ref 5–15)
BUN: 20 mg/dL (ref 8–23)
CO2: 28 mmol/L (ref 22–32)
Calcium: 10.6 mg/dL — ABNORMAL HIGH (ref 8.9–10.3)
Chloride: 99 mmol/L (ref 98–111)
Creatinine: 0.84 mg/dL (ref 0.44–1.00)
GFR, Estimated: >60 mL/min
Glucose, Bld: 62 mg/dL — ABNORMAL LOW (ref 70–99)
Potassium: 4.4 mmol/L (ref 3.5–5.1)
Sodium: 137 mmol/L (ref 135–145)
Total Bilirubin: 0.3 mg/dL (ref 0.0–1.2)
Total Protein: 7.8 g/dL (ref 6.5–8.1)

## 2024-04-22 NOTE — Progress Notes (Signed)
 " HEMATOLOGY ONCOLOGY PROGRESS NOTE  Date of service: 04/22/2024  Patient Care Team: Stacey Stacey POUR, NP as PCP - General (Geriatric Medicine) [name], MD as PCP - Electrophysiology (Cardiology) [name], MD as Consulting Physician (General Surgery) [name], MD as Consulting Physician (Orthopedic Surgery) [name], OD (Optometry) [name], MD as Consulting Physician (Neurology) [name], MD as Consulting Physician (Cardiology) [name], MD (Psychiatry) [name], MD as Consulting Physician (Pulmonary Disease)  CHIEF COMPLAINT/PURPOSE OF CONSULTATION: Follow-up for continued evaluation and management of  stage 1B high-grade triple negative breast cancer   HISTORY OF PRESENTING ILLNESS:    [name] is a wonderful 89 y.o. female who has been referred to us  by Stacey Caro, NP for evaluation and management of easily bruising. The pt reports that she is doing well overall.    The pt reports that she has seen bruises on her arm, and denies bumping into things or any trauma. The pt notes that her bruises are solely located to her upper extremities. She denies concerns for bleeding in her joints, blood in the stools, blood in the urine, nose bleeds or gum bleeds. She notes she has bruised easily for about 2 years. She also notes that she began taking Zoloft  about two years ago, and fish oil 4-5 years ago. She is not on any blood thinners. She denies taking any other new medications in the last couple years. The pt denies any thick bruises at any time. The pt denies excessive bleeding with pervious surgeries and dental extractions. The pt denies heavy periods when she was younger.   She has had two knee replacements and took Tramadol  for her pain. She denies needing to use this frequently, and takes Tylenol  for mild pains.     The pt takes Vitamin E oil for hot flashes, and notes that her Vitamin E oil successfully resolved her  hot flashes. She began taking Vitamin E about 18 months ago.    The pt notes that she has lost about 20 pounds over two years. The pt reports some constipation and denies difficulty swallowing, and weak appetite. The pt denies any dietary restrictions. She continues annual mammograms. She has a history of left sided breast cancer treated with a lumpectomy and radiation.   She notes that her energy levels have also decreased in the last 6 months, and feels tired all the time. She endorses feeling well rested after taking naps. She notes that she does feel depressed but that taking Zoloft  keeps her afloat. She notes that she feels medium enjoyment in her activities. She sees psychiatry every 6 months.   The pt lives with her husband.    Most recent lab results (02/05/18) of CBC w/diff and CMP is as follows: all values are WNL except for PLT at 117k, BUN at 30, Creatinine at 0.96.   On review of systems, pt reports some stable depression, easily bruising on upper extremities, some weight loss, and denies nose bleeds, gum bleeds, blood in the urine, blood in the stools, abdominal pains, leg swelling, and any other symptoms.    On Family Hx the pt denies bleeding disorders.   SUMMARY OF ONCOLOGIC HISTORY: Oncology History  Breast cancer of lower-outer quadrant of left female breast (HCC)  05/01/2019 Cancer Staging   Staging form: Breast, AJCC 8th Edition - Clinical stage from 05/01/2019: Stage IB (cT1c, cN0, cM0, G3, ER-, PR-, HER2-) - Signed by Crawford Morna Pickle, NP on 06/17/2019  06/04/2019 Initial Diagnosis   Breast cancer of lower-outer quadrant of left female breast (HCC)   06/04/2019 Cancer Staging   Staging form: Breast, AJCC 8th Edition - Pathologic stage from 06/04/2019: Stage IB (pT1c, pN0, cM0, G3, ER-, PR-, HER2-) - Signed by Crawford Morna Pickle, NP on 06/17/2019    INTERVAL HISTORY: [name] is a 89 y.o. female who is here today for continued evaluation  and management of stage 1B high-grade triple negative breast cancer. is ambulating with a wheelchair , accompanied by daughter  she was last seen by me on 08/19/2023; at the time she mentioned experiencing some intermittent lymphedema in LUE, some issues with memory and age-related fatigue.   Today, she says that she has been well overall. She notes some increased hip pain, which she did mention to her PCP, but this has not been addressed yet. The hip pain began about 1 month ago.  She says that the pain starts at her lower back in the morning and it travels to the hip.   She did receive steroids for the back pain last year, which helped temporarily, and Dr. Leonce deferred her to pain management.  Denies any new bone pains or new lumps/bumps.   REVIEW OF SYSTEMS:   10 Point review of systems of done and is negative except as noted above.  MEDICAL HISTORY Past Medical History:  Diagnosis Date   Arthritis    Cancer [facility])    left breast cancer    Cataracts, bilateral    removed by surgery   CHF (congestive heart failure) (HCC)    PACEMAKER & DEFIB   Complication of anesthesia    hypotensive after back surgery in 2006   Depression    Dyslipidemia    Fainted 04/21/06   AT [facility]   GERD (gastroesophageal reflux disease)    Headache(784.0)    Hearing loss    bilateral hearing aids   HLD (hyperlipidemia)    diet controlled    Hypertension    Hypothyroidism    ICD (implantable cardiac defibrillator) in place    pt has pacer/icd   ICD (implantable cardiac defibrillator), biventricular, in situ    LBBB (left bundle branch block)    Memory loss    Nonischemic cardiomyopathy (HCC)    Normal coronary arteries    s/p cardiac cath 2007   Pacemaker    ICD [employer]   Syncope    Systolic CHF [facility])    Vertigo    Wears glasses     IMMUNIZATION HISTORY Immunization History  Administered Date(s) Administered   Covid-19 Iv Non-us  Vaccine (Bibp, Sinopharm) 03/23/2024   Fluad  Quad(high Dose 65+) 01/28/2020, 01/17/2021, 03/23/2024   INFLUENZA, HIGH DOSE SEASONAL PF 01/16/2018, 12/08/2018, 01/12/2022, 01/18/2023   Influenza-Unspecified 12/08/2013, 01/10/2015, 12/09/2015, 01/12/2017, 01/17/2021   PFIZER(Purple Top)SARS-COV-2 Vaccination 05/23/2019, 06/15/2019, 12/23/2019, 08/30/2020   PNEUMOCOCCAL CONJUGATE-20 01/05/2022   Pfizer Covid-19 Vaccine Bivalent Booster 50yrs & up 02/03/2021, 01/12/2022   Pfizer(Comirnaty)Fall Seasonal Vaccine 12 years and older 12/24/2022   Pneumococcal Conjugate-13 04/09/2012   Pneumococcal Polysaccharide-23 02/28/2016   Respiratory Syncytial Virus Vaccine,Recomb Aduvanted(Arexvy) 01/05/2022   Tdap 01/07/2013   Zoster Recombinant(Shingrix) 02/03/2021, 06/26/2021   Zoster, Live 08/05/2014, 02/03/2021    SURGICAL HISTORY Past Surgical History:  Procedure Laterality Date   BACK SURGERY     lumbar fusion    BIV ICD GENERATOR CHANGEOUT N/A 02/06/2023   Procedure: BIV ICD GENERATOR CHANGEOUT;  Surgeon: [name], MD;  Location: [facility] INVASIVE CV LAB;  Service:  Cardiovascular;  Laterality: N/A;   BREAST LUMPECTOMY Left 2008   BREAST SURGERY  2000   LUMP REMOVAL. STAGE 1 CANCER   CARDIAC CATHETERIZATION     CATARACT EXTRACTION     COLONOSCOPY     EYE SURGERY     IMPLANTABLE CARDIOVERTER DEFIBRILLATOR GENERATOR CHANGE N/A 12/18/2012   Procedure: IMPLANTABLE CARDIOVERTER DEFIBRILLATOR GENERATOR CHANGE;  Surgeon: Danelle LELON Birmingham, MD;  Location: [facility] CATH LAB;  Service: Cardiovascular;  Laterality: N/A;   IR INJECT/THERA/INC NEEDLE/CATH/PLC EPI/LUMB/SAC W/IMG  06/28/2023   JOINT REPLACEMENT  06/14/01   right   LUMBAR FUSION  2006   MASS EXCISION  11/08/2011   Procedure: EXCISION MASS;  Surgeon: Jina Nephew, MD;  Location: [facility] ORS;  Service: General;  Laterality: Left;  Excision Left Thigh Mass   MASTECTOMY W/ SENTINEL NODE BIOPSY Left 06/04/2019   Procedure: LEFT MASTECTOMY WITH SENTINEL LYMPH NODE BIOPSY;  Surgeon: Nephew Jina, MD;   Location: [facility] OR;  Service: General;  Laterality: Left;   MASTECTOMY, PARTIAL  2008   GOT PACEMAKER AND DEFIB AT THAT TIME   PACEMAKER INSERTION  04/23/06   TOTAL KNEE ARTHROPLASTY  05/17/01   RIGHT KNEE   TOTAL KNEE ARTHROPLASTY Left 11/29/2014   Procedure: TOTAL LEFT KNEE ARTHROPLASTY;  Surgeon: Donnice Car, MD;  Location: [facility] ORS;  Service: Orthopedics;  Laterality: Left;    SOCIAL HISTORY Social History[1]  Social History   Social History Narrative   Lives at home with husband.   Right-handed.      As of 07/28/2014   Diet: No special diet   Caffeine: yes, Chocolate, tea and sodas    Married: YES, 1970   House: Yes, 2 stories, 2-3 persons live in home   Pets: No   Current/Past profession: Nurse, Mental Health, Publishing Rights Manager    Exercise: Yes 2-3 x weekly   Living Will: Yes   DNR: No   POA/HPOA: No       SOCIAL DRIVERS OF HEALTH SDOH Screenings   Food Insecurity: No Food Insecurity (04/03/2024)  Housing: Low Risk (04/03/2024)  Transportation Needs: No Transportation Needs (04/03/2024)  Alcohol Screen: Low Risk (05/27/2023)  Depression (PHQ2-9): Low Risk (04/06/2024)  Financial Resource Strain: Low Risk (04/03/2024)  Physical Activity: Insufficiently Active (04/03/2024)  Social Connections: Moderately Isolated (04/03/2024)  Stress: No Stress Concern Present (04/03/2024)  Tobacco Use: Low Risk (04/07/2024)     FAMILY HISTORY Family History  Problem Relation Age of Onset   Hypertension Mother    Arthritis Mother    Hypertension Father    Hypertension Brother    Hypertension Brother    Fibroids Daughter      ALLERGIES: is allergic to iodinated contrast media, iodine , shellfish allergy, strawberry extract, memantine , aspirin , and codeine.  MEDICATIONS  Current Outpatient Medications  Medication Sig Dispense Refill   carvedilol  (COREG ) 3.125 MG tablet TAKE 1 TABLET BY MOUTH TWICE  DAILY 180 tablet 3   albuterol  (VENTOLIN  HFA) 108 (90 Base) MCG/ACT inhaler Inhale 2  puffs into the lungs every 6 (six) hours as needed for wheezing or shortness of breath. 8.5 g 5   ascorbic acid (VITAMIN C) 500 MG tablet Take 500 mg by mouth daily.     buprenorphine  (BUTRANS ) 5 MCG/HR PTWK Place 1 patch onto the skin once a week. 4 patch 5   buPROPion  (WELLBUTRIN  XL) 150 MG 24 hr tablet Take 1 tablet (150 mg total) by mouth daily.     busPIRone  (BUSPAR ) 5 MG tablet Take 1 tablet (5 mg total) by  mouth 2 (two) times daily.     Cholecalciferol (VITAMIN D3) 50 MCG (2000 UT) TABS Take 2,000 Units by mouth daily.      Clobetasol  Prop Emollient Base (CLOBETASOL  PROPIONATE E) 0.05 % emollient cream Apply 1 Application topically at bedtime. 30 g 11   diclofenac  Sodium (PENNSAID ) 2 % SOLN Apply 2 Pump (40 mg total) topically 2 (two) times daily as needed. UP TO TWO PAINFUL AREAS TWICE A DAY 224 g 3   diphenhydrAMINE  (BENADRYL ) 50 MG tablet 50 mg oral diphenhydramine  administered one hour prior to contrast administration. (Patient taking differently: every 8 (eight) hours as needed. 50 mg oral diphenhydramine  administered one hour prior to contrast administration.) 1 tablet 0   DULoxetine (CYMBALTA) 60 MG capsule Take 60 mg by mouth daily.     estradiol  (ESTRING ) 7.5 MCG/24HR vaginal ring Place 1 each vaginally every 3 (three) months. 1 each 3   famotidine (PEPCID) 10 MG tablet Take 10 mg by mouth daily as needed for heartburn or indigestion.     furosemide  (LASIX ) 20 MG tablet Take 1 tablet (20 mg total) by mouth daily as needed. 90 tablet 3   hypromellose (GENTEAL) 0.3 % GEL ophthalmic ointment Apply 1 drop to eye.     levothyroxine  (SYNTHROID ) 88 MCG tablet Take 1 tablet (88 mcg total) by mouth daily. 90 tablet 3   lidocaine  (XYLOCAINE ) 2 % jelly Apply 1 Application topically as needed. 30 mL 5   lidocaine  (XYLOCAINE ) 5 % ointment Apply 1 Application topically as needed. 35.44 g 5   Lidocaine  4 % PTCH Apply 1 patch topically every 12 (twelve) hours. Apply to back     lubiprostone   (AMITIZA ) 8 MCG capsule TAKE ONE CAPSULE BY MOUTH TWICE DAILY WITH A MEAL 180 capsule 3   Magnesium  250 MG TABS Take 250 mg by mouth daily.     metaxalone  (SKELAXIN ) 800 MG tablet Take 0.5 tablets (400 mg total) by mouth 2 (two) times daily as needed for muscle spasms. 60 tablet 5   mirabegron  ER (MYRBETRIQ ) 50 MG TB24 tablet Take 1 tablet (50 mg total) by mouth daily. 30 tablet 11   Misc Natural Products (PUMPKIN SEED  OIL) CAPS Take 1 capsule by mouth daily. 180 capsule 0   nitrofurantoin , macrocrystal-monohydrate, (MACROBID ) 100 MG capsule Take 1 capsule (100 mg total) by mouth daily. 30 capsule 5   NONFORMULARY OR COMPOUNDED ITEM Apply 120 Tubes topically daily. Diclofenac /Cyclobenzaprine/lamotrigine/lidocaine /prilocaine  (10480) 2%/2%/6%/5%/1.25% cream QTY: 120 GM SIG: (NEURO) APPLY 1-2 PUMPS (1-2 GMS) TO AFFECTED AREA (S) OR FOCAL POINTS 3 TO 4 TIMES DAILY. RUB IN FOR 2 MINUTES TO ACHIEVE MAX PENETRATION. WASH HANDS WELL.     Omega-3 Fatty Acids (FISH OIL) 1200 MG CAPS Take 1,200 mg by mouth daily.     omeprazole  (PRILOSEC) 40 MG capsule TAKE 1 CAPSULE BY MOUTH DAILY 90 capsule 3   Polyethyl Glycol-Propyl Glycol (SYSTANE) 0.4-0.3 % SOLN Place 1 drop into both eyes at bedtime.     POLYETHYLENE GLYCOL 3350  PO Take 1 Capful by mouth daily.     pregabalin  (LYRICA ) 25 MG capsule Take 1 capsule (25 mg total) by mouth 2 (two) times daily. 60 capsule 5   senna-docusate (SENOKOT S) 8.6-50 MG tablet Take 2 tablets by mouth daily. 60 tablet 5   spironolactone  (ALDACTONE ) 25 MG tablet TAKE 1 TABLET BY MOUTH IN THE  MORNING 90 tablet 3   telmisartan  (MICARDIS ) 20 MG tablet Take 1 tablet (20 mg total) by mouth daily. 90 tablet 3  trospium  (SANCTURA ) 20 MG tablet Take 1 tablet (20 mg total) by mouth daily. 30 tablet 11   valACYclovir  (VALTREX ) 1000 MG tablet Take 1 tablet (1,000 mg total) by mouth daily. 90 tablet 1   Vitamin E 400 units TABS Take 400 Units by mouth daily.      Current  Facility-Administered Medications  Medication Dose Route Frequency Provider Last Rate Last Admin   denosumab  (PROLIA ) injection 60 mg  60 mg Subcutaneous Q6 months Eubanks, Jessica K, NP        PHYSICAL EXAMINATION: ECOG PERFORMANCE STATUS: 0 - Asymptomatic VITALS: Vitals:   04/22/24 1156  BP: 138/82  Pulse: 80  Resp: 18  Temp: (!) 97.5 F (36.4 C)  SpO2: 96%   Filed Weights   04/22/24 1156  Weight: 121 lb 9.6 oz (55.2 kg)   Body mass index is 24.56 kg/m.  GENERAL: alert, in no acute distress and comfortable SKIN: no acute rashes, no significant lesions EYES: conjunctiva are pink and non-injected, sclera anicteric OROPHARYNX: MMM, no exudates, no oropharyngeal erythema or ulceration NECK: supple, no JVD LYMPH:  no palpable lymphadenopathy in the cervical, axillary or inguinal regions LUNGS: clear to auscultation b/l with normal respiratory effort HEART: regular rate & rhythm ABDOMEN:  normoactive bowel sounds , non tender, not distended, no hepatosplenomegaly Extremity: no pedal edema PSYCH: alert & oriented x 3 with fluent speech NEURO: no focal motor/sensory deficits  LABORATORY DATA:   I have reviewed the data as listed     Latest Ref Rng & Units 04/22/2024   11:14 AM 02/21/2024    8:44 PM 08/19/2023    9:27 AM  CBC EXTENDED  WBC 4.0 - 10.5 K/uL 10.1  8.1  9.0   RBC 3.87 - 5.11 MIL/uL 4.63  4.52  4.19   Hemoglobin 12.0 - 15.0 g/dL 86.5  86.8  87.5   HCT 36.0 - 46.0 % 40.2  39.9  36.5   Platelets 150 - 400 K/uL 152  152  135   NEUT# 1.7 - 7.7 K/uL 7.1  4.5  5.9   Lymph# 0.7 - 4.0 K/uL 1.7  2.3  2.1       Latest Ref Rng & Units 04/22/2024   11:14 AM 02/21/2024    8:44 PM 11/20/2023   10:11 AM  CMP  Glucose 70 - 99 mg/dL 62  91  93   BUN 8 - 23 mg/dL 20  14  16    Creatinine 0.44 - 1.00 mg/dL 9.15  9.33  9.16   Sodium 135 - 145 mmol/L 137  135  135   Potassium 3.5 - 5.1 mmol/L 4.4  4.7  4.3   Chloride 98 - 111 mmol/L 99  98  98   CO2 22 - 32 mmol/L 28  29   29    Calcium 8.9 - 10.3 mg/dL 89.3  88.4  89.6   Total Protein 6.5 - 8.1 g/dL 7.8  7.6  6.9   Total Bilirubin 0.0 - 1.2 mg/dL 0.3  0.3  0.5   Alkaline Phos 38 - 126 U/L 66  53    AST 15 - 41 U/L 24  25  21    ALT 0 - 44 U/L 14  16  17     04/14/2024    PREVIOUS STUDIES:     03/18/2020 Mammogram  05/30/2021 Mammogram    RADIOGRAPHIC STUDIES: I have personally reviewed the radiological images as listed and agreed with the findings in the report. DG Bone Density Result Date: 04/16/2024 EXAM:  DUAL X-RAY ABSORPTIOMETRY (DXA) FOR BONE MINERAL DENSITY 04/16/2024 12:17 pm CLINICAL DATA:  89 year old Female Postmenopausal. Estrogen deficiency Patient is or has been on bone building therapies. TECHNIQUE: An axial (e.g., hips, spine) and/or appendicular (e.g., radius) exam was performed, as appropriate, using GE Secretary/administrator at [employer]. Images are obtained for bone mineral density measurement and are not obtained for diagnostic purposes. [id number] Exclusions: Lumbar spine due to advanced degenerative changes. COMPARISON:  None. New baseline. FINDINGS: Scan quality: Good. LEFT FEMORAL NECK: BMD (in g/cm2): 0.898 T-score: -1.0 Z-score: 0.8 LEFT TOTAL HIP: BMD (in g/cm2): 0.983 T-score: -0.2 Z-score: 1.5 RIGHT FEMORAL NECK: BMD (in g/cm2): 0.911 T-score: -0.9 Z-score: 0.9 RIGHT TOTAL HIP: BMD (in g/cm2): 1.013 T-score: 0.0 Z-score: 1.8 LEFT FOREARM (RADIUS 33%): BMD (in g/cm2): 0.696 T-score: -2.1 Z-score: 0.7 FRAX 10-YEAR PROBABILITY OF FRACTURE: FRAX not reported as the patient is receiving bone building therapy. IMPRESSION: Osteopenia based on BMD. Fracture risk is unknown due to history of bone building therapy. RECOMMENDATIONS: 1. All patients should optimize calcium and vitamin D  intake. 2. Consider FDA-approved medical therapies in postmenopausal women and men aged 78 years and older, based on the following: - A hip or vertebral (clinical or morphometric) fracture -  T-score less than or equal to -2.5 and secondary causes have been excluded. - Low bone mass (T-score between -1.0 and -2.5) and a 10-year probability of a hip fracture greater than or equal to 3% or a 10-year probability of a major osteoporosis-related fracture greater than or equal to 20% based on the [place] -adapted WHO algorithm. - Clinician judgment and/or patient preferences may indicate treatment for people with 10-year fracture probabilities above or below these levels 3. Patients with diagnosis of osteoporosis or at high risk for fracture should have regular bone mineral density tests. For patients eligible for Medicare, routine testing is allowed once every 2 years. The testing frequency can be increased to one year for patients who have rapidly progressing disease, those who are receiving or discontinuing medical therapy to restore bone mass, or have additional risk factors. Electronically Signed   By: Harrietta Sherry M.D.   On: 04/16/2024 13:00   CT Head Wo Contrast Result Date: 02/22/2024 EXAM: CT HEAD WITHOUT CONTRAST 02/22/2024 12:12:26 AM TECHNIQUE: CT of the head was performed without the administration of intravenous contrast. Automated exposure control, iterative reconstruction, and/or weight based adjustment of the mA/kV was utilized to reduce the radiation dose to as low as reasonably achievable. COMPARISON: None available. CLINICAL HISTORY: Ataxia, acute, nontraumatic (Ped 0-17y) FINDINGS: BRAIN AND VENTRICLES: No acute hemorrhage. No evidence of acute infarct. No hydrocephalus. No extra-axial collection. No mass effect or midline shift. ORBITS: No acute abnormality. Bilateral lens replacement. SINUSES: No acute abnormality. SOFT TISSUES AND SKULL: No acute soft tissue abnormality. No skull fracture. IMPRESSION: 1. No acute intracranial abnormality. Electronically signed by: Morgane Naveau MD 02/22/2024 12:22 AM EST RP Workstation: [id number]   CUP PACEART REMOTE DEVICE CHECK Result Date:  02/06/2024 ICD: Scheduled remote reviewed. Normal device function.  Presenting rhythm: AS/VP Next remote transmission per protocol. ML, CVRS   ASSESSMENT & PLAN:  89 y.o. female with  1. H/o Easily bruising - labs did not demonstrate any specific bleeding diathesis  2. Stage IB ER/PR/Her 2 negative, grade 3 left breast cancer s/p mastectomy. NegSNLBx. 06/04/2019. Was not considered to be a good candidate for adjuvant chemotherapy given her age and medical issues.  3. LUE lymphedema-left breast mastectomy and lymph node biopsy.  Controlled with use of lymphedema sleeve.  Discussed maintaining compliance with use of her sleeve.   2)  Patient Active Problem List   Diagnosis Date Noted   Positive ANA (antinuclear antibody) 01/09/2024   Vitamin D  deficiency 01/09/2024   Spinal stenosis of lumbar region with neurogenic claudication 08/28/2023   Nerve pain due to spinal stenosis 08/28/2023   Piriformis syndrome of both sides 01/30/2023   Neuropathy 08/01/2022   Atherosclerosis of native artery of both lower extremities with rest pain (HCC) 07/09/2022   Interstitial pulmonary disease (HCC) 07/09/2022   Sciatica associated with disorder of multiple sites of spine 03/20/2021   Bipolar II disorder (HCC) 03/20/2021   Chronic pain syndrome 03/20/2021   Urinary retention 02/04/2020   Seasonal allergies 02/04/2020   Recurrent major depressive disorder, in partial remission 07/17/2019   Status post left mastectomy 07/01/2019   Breast cancer of lower-outer quadrant of left female breast (HCC) 06/04/2019   Candida infection, oral 01/15/2019   Senile purpura 04/14/2018   Lumbar post-laminectomy syndrome 12/03/2017   Lumbar spondylosis 12/03/2017   History of back surgery 09/01/2017   Constipation 09/01/2017   Hypothyroidism due to acquired atrophy of thyroid 09/01/2017   Mixed hyperlipidemia 09/01/2017   Age-related osteoporosis without current pathological fracture 09/01/2017   High risk  medication use 09/01/2017   Arthritis of hand 05/17/2017   Atherosclerosis of native arteries of extremity with intermittent claudication 05/15/2017   Status post total bilateral knee replacement 12/20/2016   Gait abnormality 07/16/2016   Chronic low back pain 07/16/2016   Mild cognitive impairment 07/16/2016   Wrist pain 05/10/2015   S/P left TKA 11/29/2014   S/P knee replacement 11/29/2014   Spontaneous bruising 08/27/2014   Numbness and tingling in right hand 07/28/2014   Essential tremor 07/28/2014   Dizziness 05/28/2014   Shaky 05/28/2014   Memory loss 05/28/2014   Depression 05/06/2014   Lipoma of left upper thigh 3x5 cm 09/28/2011   Cerebral vascular accident (HCC) 08/09/2011   Syncope 08/09/2011   History of breast cancer [id number], s/p BCT 2008, triple negative 01/26/2011   Chronic L breast pain with chronic recurrent seroma, s/p excisional biopsy 01/12/2010 01/26/2011   Fainted    ICD (implantable cardioverter-defibrillator), biventricular, in situ 08/02/2010   Chronic systolic heart failure (HCC) 08/02/2010   Essential hypertension 08/02/2010  -continue f/u with PCP for mx of other chronic medical issues.  PLAN: - Discussed lab results on 04/22/2024 in detail with patient: CBC showed WBC of 10.1K, Hemoglobin of 13.4, and PLTs of 152K CMP with Calcium 10.6 decreased from 11.5.  - Back pain likely explained by severe spinal canal stenosis at L2-3 and L3-4 noted on 06/13/2023 Lumbar CT. Pain followed by pain clinic.   - Reviewed 04/14/2024 mammogram: NEGATIVE   - She is officially in remission, out by 5 years. We can start monitoring her annually  - Discussed risk vs benefit of taking Tylenol  for additional pain management.  FOLLOW-UP in 12 months for labs and follow-up with Dr. Onesimo.  The total time spent in the appointment was *** minutes* .  All of the patient's questions were answered and the patient knows to call the clinic with any problems, questions, or  concerns.  Emaline Onesimo MD MS AAHIVMS [facility] Hematology/Oncology Physician [facility]  *Total Encounter Time as defined by the Centers for Medicare and Medicaid Services includes, in addition to the face-to-face time of a patient visit (documented in the note above) non-face-to-face time: obtaining and reviewing  outside history, ordering and reviewing medications, tests or procedures, care coordination (communications with other health care professionals or caregivers) and documentation in the medical record.  I,Emily Lagle,acting as a neurosurgeon for Emaline Saran, MD.,have documented all relevant documentation on the behalf of Emaline Saran, MD,as directed by  Emaline Saran, MD while in the presence of Emaline Saran, MD.  I have reviewed the above documentation for accuracy and completeness, and I agree with the above.  Emaline Saran, MD    [1]  Social History Tobacco Use   Smoking status: Never    Passive exposure: Never   Smokeless tobacco: Never  Vaping Use   Vaping status: Never Used  Substance Use Topics   Alcohol use: No   Drug use: No   "

## 2024-04-29 ENCOUNTER — Telehealth: Payer: Self-pay | Admitting: *Deleted

## 2024-04-29 NOTE — Telephone Encounter (Signed)
 Received [employer]  Verification.  No Copay, PA Needed and obtained through [facility] --PA initiated through [facility] and APPROVED 11/14/2023-11/13/2024 Auth #: [id number]

## 2024-05-06 ENCOUNTER — Ambulatory Visit: Payer: Medicare Other

## 2024-05-06 ENCOUNTER — Encounter: Payer: Self-pay | Admitting: Pulmonary Disease

## 2024-05-06 ENCOUNTER — Ambulatory Visit: Admitting: Pulmonary Disease

## 2024-05-06 VITALS — BP 126/82 | HR 78 | Wt 122.4 lb

## 2024-05-06 DIAGNOSIS — I428 Other cardiomyopathies: Secondary | ICD-10-CM

## 2024-05-06 DIAGNOSIS — J849 Interstitial pulmonary disease, unspecified: Secondary | ICD-10-CM | POA: Diagnosis not present

## 2024-05-06 LAB — CUP PACEART REMOTE DEVICE CHECK
Battery Remaining Longevity: 114 mo
Battery Remaining Percentage: 100 %
Brady Statistic RA Percent Paced: 0 %
Brady Statistic RV Percent Paced: 100 %
Date Time Interrogation Session: 20260128024000
HighPow Impedance: 40 Ohm
Implantable Lead Connection Status: 753985
Implantable Lead Connection Status: 753985
Implantable Lead Connection Status: 753985
Implantable Lead Implant Date: 20080116
Implantable Lead Implant Date: 20080116
Implantable Lead Implant Date: 20080116
Implantable Lead Location: 753858
Implantable Lead Location: 753859
Implantable Lead Location: 753860
Implantable Lead Model: 157
Implantable Lead Model: 4469
Implantable Lead Model: 4555
Implantable Lead Serial Number: 136532
Implantable Lead Serial Number: 161542
Implantable Lead Serial Number: 473495
Implantable Pulse Generator Implant Date: 20241030
Lead Channel Impedance Value: 396 Ohm
Lead Channel Impedance Value: 461 Ohm
Lead Channel Impedance Value: 949 Ohm
Lead Channel Pacing Threshold Amplitude: 0.5 V
Lead Channel Pacing Threshold Amplitude: 0.6 V
Lead Channel Pacing Threshold Pulse Width: 0.4 ms
Lead Channel Pacing Threshold Pulse Width: 0.4 ms
Lead Channel Setting Pacing Amplitude: 2 V
Lead Channel Setting Pacing Amplitude: 2 V
Lead Channel Setting Pacing Amplitude: 2.2 V
Lead Channel Setting Pacing Pulse Width: 0.4 ms
Lead Channel Setting Pacing Pulse Width: 0.8 ms
Lead Channel Setting Sensing Sensitivity: 0.5 mV
Lead Channel Setting Sensing Sensitivity: 1 mV
Pulse Gen Serial Number: 157167

## 2024-05-06 NOTE — Progress Notes (Signed)
 "  Established Patient Pulmonology Office Visit   Subjective:  Patient ID: [name], female    DOB: June 26, 1933  MRN: [id number]  CC:  Chief Complaint  Patient presents with   Medical Management of Chronic Issues    Pt states hospital visit from November     Discussed the use of AI scribe software for clinical note transcription with the patient, who gave verbal consent to proceed.  History of Present Illness [name] is an 89 year old female with interstitial lung disease who presents for follow-up.  Her interstitial lung disease has shown progression of fibrosis and honeycombing on imaging. Her case was reviewed at the ILD conference, where antifibrotic therapy was considered. Rheumatology did not identify systemic autoimmune disease but raised concern for possible sarcoidosis contributing to lung changes and scattered nodules. 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  She has heart failure with reduced ejection fraction, dementia, and hypertension. She was seen in the ER in November for markedly elevated blood pressure. Breathing has been okay with no leg swelling.  She has occasional brief left-sided pain in the evenings lasting a few minutes and resolving spontaneously. It is not associated with eating and does not occur nightly.  She is a never smoker.   Inflammatory lab panel shows positive ANA 1:320 titer with homogenous nucleolar pattern. PFTs show mild restriction and a mild reduction in diffusion capacity.      ROS   Current Medications[1]      Objective:  BP 126/82   Pulse 78   Wt 122 lb 6.4 oz (55.5 kg)   LMP  (LMP Unknown)   SpO2 93%   BMI 24.72 kg/m     Physical Exam Constitutional:      General: She is not in acute distress.    Appearance: Normal appearance.  Eyes:     General: No scleral icterus.    Conjunctiva/sclera: Conjunctivae normal.  Cardiovascular:     Rate and  Rhythm: Normal rate and regular rhythm.  Pulmonary:     Breath sounds: No wheezing, rhonchi or rales.  Musculoskeletal:     Right lower leg: No edema.     Left lower leg: No edema.  Skin:    General: Skin is warm and dry.  Neurological:     General: No focal deficit present.      Diagnostic Review:  Last CBC Lab Results  Component Value Date   WBC 10.1 04/22/2024   HGB 13.4 04/22/2024   HCT 40.2 04/22/2024   MCV 86.8 04/22/2024   MCH 28.9 04/22/2024   RDW 14.9 04/22/2024   PLT 152 04/22/2024   Last metabolic panel Lab Results  Component Value Date   GLUCOSE 62 (L) 04/22/2024   NA 137 04/22/2024   K 4.4 04/22/2024   CL 99 04/22/2024   CO2 28 04/22/2024   BUN 20 04/22/2024   CREATININE 0.84 04/22/2024   GFRNONAA >60 04/22/2024   CALCIUM 10.6 (H) 04/22/2024   PHOS 1.4 (L) 06/05/2019   PROT 7.8 04/22/2024   ALBUMIN 4.3 04/22/2024   LABGLOB 3.0 01/23/2023   AGRATIO 2.0 10/05/2015   BILITOT 0.3 04/22/2024   ALKPHOS 66 04/22/2024   AST 24 04/22/2024   ALT 14 04/22/2024   ANIONGAP 10 04/22/2024    HRCT Chest 01/28/23 1. Fibrotic interstitial lung disease with slight upper lobe predominance, when compared with September 09, 2020 prior high-resolution chest CT nodularity has decreased there is increased fibrotic  component. 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 Findings are suggestive of an alternative diagnosis (not UIP) per consensus guidelines: Diagnosis of Idiopathic Pulmonary Fibrosis: An Official ATS/ERS/JRS/ALAT Clinical Practice Guideline. Am JINNY [employer] Crit Care Med Vol 198, Iss 5, [phone], Dec 08 2016. 2. Coronary artery calcifications and aortic Atherosclerosis (ICD10-I70.0).    Assessment & Plan:   Assessment & Plan Interstitial pulmonary disease (HCC)      Assessment and Plan Assessment & Plan Interstitial lung disease Progression of fibrosis and honeycombing noted, albeit at slow rate. Possible sarcoidosis considered. History of positive ANA.  Rheumatology evaluation not concerning for autoimmune involvement. Discussed anti-fibrotic therapyies, but decision made to not start due to side effect profile. Monitoring chosen due to slow progression and her preference. - Scheduled annual follow-up unless symptoms change. - Monitor breathing and oxygen levels at home with an oximeter. - Contact clinic if breathing changes or oxygen levels drop below 88%. - Consider palliative or hospice care if lung disease progresses significantly.      Return in about 1 year (around 05/06/2025) for f/u visit Dr. Kara.   Dorn KATHEE Kara, MD     [1]  Current Outpatient Medications:    albuterol  (VENTOLIN  HFA) 108 (90 Base) MCG/ACT inhaler, Inhale 2 puffs into the lungs every 6 (six) hours as needed for wheezing or shortness of breath., Disp: 8.5 g, Rfl: 5   ascorbic acid (VITAMIN C) 500 MG tablet, Take 500 mg by mouth daily., Disp: , Rfl:    buprenorphine  (BUTRANS ) 5 MCG/HR PTWK, Place 1 patch onto the skin once a week., Disp: 4 patch, Rfl: 5   buPROPion  (WELLBUTRIN  XL) 150 MG 24 hr tablet, Take 1 tablet (150 mg total) by mouth daily., Disp: , Rfl:    busPIRone  (BUSPAR ) 5 MG tablet, Take 1 tablet (5 mg total) by mouth 2 (two) times daily., Disp: , Rfl:    carvedilol  (COREG ) 3.125 MG tablet, TAKE 1 TABLET BY MOUTH TWICE  DAILY, Disp: 180 tablet, Rfl: 3   Cholecalciferol (VITAMIN D3) 50 MCG (2000 UT) TABS, Take 2,000 Units by mouth daily. , Disp: , Rfl:    Clobetasol  Prop Emollient Base (CLOBETASOL  PROPIONATE E) 0.05 % emollient cream, Apply 1 Application topically at bedtime., Disp: 30 g, Rfl: 11   diclofenac  Sodium (PENNSAID ) 2 % SOLN, Apply 2 Pump (40 mg total) topically 2 (two) times daily as needed. UP TO TWO PAINFUL AREAS TWICE A DAY, Disp: 224 g, Rfl: 3   diphenhydrAMINE  (BENADRYL ) 50 MG tablet, 50 mg oral diphenhydramine  administered one hour prior to contrast administration. (Patient taking differently: every 8 (eight) hours as needed. 50 mg  oral diphenhydramine  administered one hour prior to contrast administration.), Disp: 1 tablet, Rfl: 0   DULoxetine (CYMBALTA) 60 MG capsule, Take 60 mg by mouth daily., Disp: , Rfl:    estradiol  (ESTRING ) 7.5 MCG/24HR vaginal ring, Place 1 each vaginally every 3 (three) months., Disp: 1 each, Rfl: 3   famotidine (PEPCID) 10 MG tablet, Take 10 mg by mouth daily as needed for heartburn or indigestion., Disp: , Rfl:    furosemide  (LASIX ) 20 MG tablet, Take 1 tablet (20 mg total) by mouth daily as needed., Disp: 90 tablet, Rfl: 3   hypromellose (GENTEAL) 0.3 % GEL ophthalmic ointment, Apply 1 drop to eye., Disp: , Rfl:    levothyroxine  (SYNTHROID ) 88 MCG tablet, Take 1 tablet (88 mcg total) by mouth daily., Disp: 90 tablet, Rfl: 3   lidocaine  (XYLOCAINE ) 2 % jelly, Apply 1 Application topically as  needed., Disp: 30 mL, Rfl: 5   lidocaine  (XYLOCAINE ) 5 % ointment, Apply 1 Application topically as needed., Disp: 35.44 g, Rfl: 5   Lidocaine  4 % PTCH, Apply 1 patch topically every 12 (twelve) hours. Apply to back, Disp: , Rfl:    lubiprostone  (AMITIZA ) 8 MCG capsule, TAKE ONE CAPSULE BY MOUTH TWICE DAILY WITH A MEAL, Disp: 180 capsule, Rfl: 3   Magnesium  250 MG TABS, Take 250 mg by mouth daily., Disp: , Rfl:    metaxalone  (SKELAXIN ) 800 MG tablet, Take 0.5 tablets (400 mg total) by mouth 2 (two) times daily as needed for muscle spasms., Disp: 60 tablet, Rfl: 5   mirabegron  ER (MYRBETRIQ ) 50 MG TB24 tablet, Take 1 tablet (50 mg total) by mouth daily., Disp: 30 tablet, Rfl: 11   Misc Natural Products (PUMPKIN SEED  OIL) CAPS, Take 1 capsule by mouth daily., Disp: 180 capsule, Rfl: 0   nitrofurantoin , macrocrystal-monohydrate, (MACROBID ) 100 MG capsule, Take 1 capsule (100 mg total) by mouth daily., Disp: 30 capsule, Rfl: 5   NONFORMULARY OR COMPOUNDED ITEM, Apply 120 Tubes topically daily. Diclofenac /Cyclobenzaprine/lamotrigine/lidocaine /prilocaine  (10480) 2%/2%/6%/5%/1.25% cream QTY: 120 GM SIG: (NEURO) APPLY  1-2 PUMPS (1-2 GMS) TO AFFECTED AREA (S) OR FOCAL POINTS 3 TO 4 TIMES DAILY. RUB IN FOR 2 MINUTES TO ACHIEVE MAX PENETRATION. WASH HANDS WELL., Disp: , Rfl:    Omega-3 Fatty Acids (FISH OIL) 1200 MG CAPS, Take 1,200 mg by mouth daily., Disp: , Rfl:    omeprazole  (PRILOSEC) 40 MG capsule, TAKE 1 CAPSULE BY MOUTH DAILY, Disp: 90 capsule, Rfl: 3   Polyethyl Glycol-Propyl Glycol (SYSTANE) 0.4-0.3 % SOLN, Place 1 drop into both eyes at bedtime., Disp: , Rfl:    POLYETHYLENE GLYCOL 3350  PO, Take 1 Capful by mouth daily., Disp: , Rfl:    pregabalin  (LYRICA ) 25 MG capsule, Take 1 capsule (25 mg total) by mouth 2 (two) times daily., Disp: 60 capsule, Rfl: 5   senna-docusate (SENOKOT S) 8.6-50 MG tablet, Take 2 tablets by mouth daily., Disp: 60 tablet, Rfl: 5   spironolactone  (ALDACTONE ) 25 MG tablet, TAKE 1 TABLET BY MOUTH IN THE  MORNING, Disp: 90 tablet, Rfl: 3   telmisartan  (MICARDIS ) 20 MG tablet, Take 1 tablet (20 mg total) by mouth daily., Disp: 90 tablet, Rfl: 3   trospium  (SANCTURA ) 20 MG tablet, Take 1 tablet (20 mg total) by mouth daily., Disp: 30 tablet, Rfl: 11   valACYclovir  (VALTREX ) 1000 MG tablet, Take 1 tablet (1,000 mg total) by mouth daily., Disp: 90 tablet, Rfl: 1   Vitamin E 400 units TABS, Take 400 Units by mouth daily. , Disp: , Rfl:   Current Facility-Administered Medications:    denosumab  (PROLIA ) injection 60 mg, 60 mg, Subcutaneous, Q6 months, [place], Harlene POUR, NP  "

## 2024-05-06 NOTE — Patient Instructions (Signed)
 We have reviewed your case with the interstitial lung disease team and a trial of anti-fibrotic medications was considered.  Given side effect profile of these medications, we will not treat and monitor you for any further changes in your breathing and oxygen levels  We will hold off on further CT Chest scans or lung function testing at this time  Follow up in 1 year, call sooner if your breathing is worsening.

## 2024-05-06 NOTE — Assessment & Plan Note (Addendum)
 [name]

## 2024-05-07 ENCOUNTER — Ambulatory Visit: Payer: Self-pay | Admitting: Cardiovascular Disease

## 2024-05-14 NOTE — Progress Notes (Signed)
 Remote ICD Transmission

## 2024-05-15 ENCOUNTER — Encounter: Payer: Self-pay | Admitting: Obstetrics and Gynecology

## 2024-05-15 ENCOUNTER — Ambulatory Visit: Admitting: Obstetrics and Gynecology

## 2024-05-15 VITALS — BP 128/80 | HR 72

## 2024-05-15 DIAGNOSIS — R350 Frequency of micturition: Secondary | ICD-10-CM

## 2024-05-15 DIAGNOSIS — N3941 Urge incontinence: Secondary | ICD-10-CM

## 2024-05-15 DIAGNOSIS — R339 Retention of urine, unspecified: Secondary | ICD-10-CM

## 2024-05-15 LAB — POCT URINALYSIS DIP (CLINITEK)
Bilirubin, UA: NEGATIVE
Blood, UA: NEGATIVE
Glucose, UA: NEGATIVE mg/dL
Ketones, POC UA: NEGATIVE mg/dL
Leukocytes, UA: NEGATIVE
Nitrite, UA: NEGATIVE
POC PROTEIN,UA: NEGATIVE
Spec Grav, UA: 1.01
Urobilinogen, UA: 0.2 U/dL
pH, UA: 6.5

## 2024-05-15 NOTE — Patient Instructions (Signed)
 Stop myrbetriq  and trospium 

## 2024-05-15 NOTE — Progress Notes (Signed)
 [facility] Return Visit  SUBJECTIVE  History of Present Illness: [name] is a 89 y.o. female seen in follow-up for mixed incontinence. Had intravesical botox  on 09/26/23. Also had second urethral bulking on 05/16/23. Also on daily valtex for suppression of genital HSV flares.   Last visit added 50mg  Myrbetriq  and changed trospium  to 20mg  daily. Leakage has not decreased at all on the myrbetriq . Still going to the bathroom and urinating but not sure if she is emptying well. Leakage happens at least 4 times per day, if she holds her urine just a little too long. She is still also taking pumpkin seed  extract and nitrofurantoin  suppressive therapy.   Previously tried trospium , myrbetriq , gemtesa  and vesicare for OAB. Has a pacemaker and not eligible for PTNS.   Past Medical History: Patient  has a past medical history of Arthritis, Cancer (HCC), Cataracts, bilateral, CHF (congestive heart failure) (HCC), Complication of anesthesia, Depression, Dyslipidemia, Fainted (04/21/06), GERD (gastroesophageal reflux disease), Headache(784.0), Hearing loss, HLD (hyperlipidemia), Hypertension, Hypothyroidism, ICD (implantable cardiac defibrillator) in place, ICD (implantable cardiac defibrillator), biventricular, in situ, LBBB (left bundle branch block), Memory loss, Nonischemic cardiomyopathy (HCC), Normal coronary arteries, Pacemaker, Syncope, Systolic CHF (HCC), Vertigo, and Wears glasses.   Past Surgical History: She  has a past surgical history that includes Total knee arthroplasty (05/17/01); Lumbar fusion (2006); Mastectomy, partial (2008); Breast surgery (2000); Joint replacement (06/14/01); Pacemaker insertion (04/23/06); Back surgery; Cardiac catheterization; Mass excision (11/08/2011); implantable cardioverter defibrillator generator change (N/A, 12/18/2012); Cataract extraction; Eye surgery; Total knee arthroplasty (Left, 11/29/2014); Breast lumpectomy (Left, 2008); Colonoscopy; Mastectomy  w/ sentinel node biopsy (Left, 06/04/2019); BIV ICD GENERATOR CHANGEOUT (N/A, 02/06/2023); and IR INJECT DIAG/THERA/INC NEEDLE/CATH/PLC EPI/LUMB/SAC W/IMG (06/28/2023).   Medications: She has a current medication list which includes the following prescription(s): albuterol , ascorbic acid, buprenorphine , bupropion , buspirone , carvedilol , vitamin d3, clobetasol  propionate e, diclofenac  sodium, diphenhydramine , duloxetine, estradiol , famotidine, furosemide , hypromellose, levothyroxine , lidocaine , lidocaine , lidocaine , lubiprostone , magnesium , metaxalone , mirabegron  er, pumpkin seed  oil, nitrofurantoin  (macrocrystal-monohydrate), NONFORMULARY OR COMPOUNDED ITEM, fish oil, omeprazole , systane, polyethylene glycol 3350 , pregabalin , senna-docusate, spironolactone , telmisartan , trospium , valacyclovir , and vitamin e, and the following Facility-Administered Medications: denosumab .   Allergies: Patient is allergic to iodinated contrast media, iodine , shellfish allergy, strawberry extract, memantine , aspirin , and codeine.   Social History: Patient  reports that she has never smoked. She has never been exposed to tobacco smoke. She has never used smokeless tobacco. She reports that she does not drink alcohol and does not use drugs.     OBJECTIVE     Physical Exam: Vitals:   05/15/24 1442  BP: 128/80  Pulse: 72     Gen: No apparent distress, A&O x 3.  Straight Catheterization Procedure for PVR: After verbal consent was obtained from the patient for catheterization to assess bladder emptying and residual volume the urethra and surrounding tissues were prepped with betadine  and an in and out catheterization was performed.  PVR was .  Urine appeared clear yellow. The patient tolerated the procedure well.  Results for orders placed or performed in visit on 05/15/24  POCT URINALYSIS DIP (CLINITEK)   Collection Time: 05/15/24  3:33 PM  Result Value Ref Range   Color, UA yellow yellow   Clarity, UA  clear clear   Glucose, UA negative negative mg/dL   Bilirubin, UA negative negative   Ketones, POC UA negative negative mg/dL   Spec Grav, UA 8.989 8.989 - 1.025   Blood, UA negative negative   pH, UA 6.5 5.0 - 8.0  POC PROTEIN,UA negative negative, trace   Urobilinogen, UA 0.2 0.2 or 1.0 E.U./dL   Nitrite, UA Negative Negative   Leukocytes, UA Negative Negative   *Note: Due to a large number of results and/or encounters for the requested time period, some results have not been displayed. A complete set of results can be found in Results Review.      ASSESSMENT AND PLAN    [name] is a 89 y.o. with:  1. Incomplete bladder emptying   2. Urinary frequency   3. Urge incontinence     - Elevated PVR on cath.  Negative UA so suspect this may be due to the OAB medications. Will stop them for a month and see if she is back at her baseline.  - Return end of march for pessary cleaning, estring  and repeat PVR.  - continue taking valtex suppression and nitrofurantoin  for UTI ppx   Stacey LOISE Caper, MD   Time spent: I spent 25 minutes dedicated to the care of this patient on the date of this encounter to include pre-visit review of records, face-to-face time with the patient  and post visit documentation.

## 2024-05-20 ENCOUNTER — Ambulatory Visit: Admitting: Student

## 2024-06-01 ENCOUNTER — Encounter: Payer: Medicare Other | Admitting: Nurse Practitioner

## 2024-06-05 ENCOUNTER — Ambulatory Visit: Payer: Self-pay

## 2024-06-15 ENCOUNTER — Encounter: Admitting: Physical Medicine and Rehabilitation

## 2024-07-01 ENCOUNTER — Ambulatory Visit: Admitting: Obstetrics and Gynecology

## 2024-07-06 ENCOUNTER — Encounter: Attending: Physical Medicine and Rehabilitation | Admitting: Physical Medicine and Rehabilitation

## 2024-07-27 ENCOUNTER — Ambulatory Visit: Admitting: Nurse Practitioner

## 2024-10-06 ENCOUNTER — Ambulatory Visit: Payer: Self-pay | Admitting: Physician Assistant

## 2025-04-28 ENCOUNTER — Inpatient Hospital Stay: Admitting: Hematology
